# Patient Record
Sex: Male | Born: 1935 | Race: Black or African American | Hispanic: No | Marital: Married | Smoking: Former smoker
Health system: Southern US, Community
[De-identification: ages and names within clinical notes are randomized; demographics above are authoritative.]

## PROBLEM LIST (undated history)

## (undated) ENCOUNTER — Emergency Department (HOSPITAL_COMMUNITY): Payer: Medicare PPO

## (undated) DIAGNOSIS — I10 Essential (primary) hypertension: Secondary | ICD-10-CM

## (undated) DIAGNOSIS — I6789 Other cerebrovascular disease: Secondary | ICD-10-CM

## (undated) DIAGNOSIS — K219 Gastro-esophageal reflux disease without esophagitis: Secondary | ICD-10-CM

## (undated) DIAGNOSIS — Z8546 Personal history of malignant neoplasm of prostate: Secondary | ICD-10-CM

## (undated) DIAGNOSIS — N289 Disorder of kidney and ureter, unspecified: Secondary | ICD-10-CM

## (undated) DIAGNOSIS — I509 Heart failure, unspecified: Secondary | ICD-10-CM

## (undated) DIAGNOSIS — E785 Hyperlipidemia, unspecified: Secondary | ICD-10-CM

## (undated) DIAGNOSIS — I6529 Occlusion and stenosis of unspecified carotid artery: Secondary | ICD-10-CM

## (undated) DIAGNOSIS — N186 End stage renal disease: Secondary | ICD-10-CM

## (undated) DIAGNOSIS — E119 Type 2 diabetes mellitus without complications: Secondary | ICD-10-CM

## (undated) DIAGNOSIS — I2510 Atherosclerotic heart disease of native coronary artery without angina pectoris: Secondary | ICD-10-CM

## (undated) DIAGNOSIS — D500 Iron deficiency anemia secondary to blood loss (chronic): Secondary | ICD-10-CM

## (undated) DIAGNOSIS — M25569 Pain in unspecified knee: Secondary | ICD-10-CM

## (undated) DIAGNOSIS — E876 Hypokalemia: Secondary | ICD-10-CM

## (undated) HISTORY — DX: Occlusion and stenosis of unspecified carotid artery: I65.29

## (undated) HISTORY — DX: Essential (primary) hypertension: I10

## (undated) HISTORY — DX: Other cerebrovascular disease: I67.89

## (undated) HISTORY — PX: PROSTATE SURGERY: SHX751

## (undated) HISTORY — DX: Iron deficiency anemia secondary to blood loss (chronic): D50.0

## (undated) HISTORY — DX: Pain in unspecified knee: M25.569

## (undated) HISTORY — DX: Gastro-esophageal reflux disease without esophagitis: K21.9

## (undated) HISTORY — DX: Personal history of malignant neoplasm of prostate: Z85.46

## (undated) HISTORY — DX: Hypokalemia: E87.6

## (undated) HISTORY — DX: Disorder of kidney and ureter, unspecified: N28.9

## (undated) HISTORY — DX: Hyperlipidemia, unspecified: E78.5

## (undated) HISTORY — DX: Type 2 diabetes mellitus without complications: E11.9

---

## 1999-04-28 ENCOUNTER — Other Ambulatory Visit: Admission: RE | Admit: 1999-04-28 | Discharge: 1999-04-28 | Payer: Self-pay | Admitting: Oral Surgery

## 2000-04-21 ENCOUNTER — Ambulatory Visit (HOSPITAL_COMMUNITY): Admission: RE | Admit: 2000-04-21 | Discharge: 2000-04-21 | Payer: Self-pay | Admitting: Cardiology

## 2000-05-31 ENCOUNTER — Ambulatory Visit (HOSPITAL_COMMUNITY): Admission: RE | Admit: 2000-05-31 | Discharge: 2000-05-31 | Payer: Self-pay | Admitting: *Deleted

## 2000-05-31 ENCOUNTER — Encounter: Payer: Self-pay | Admitting: *Deleted

## 2000-05-31 HISTORY — PX: CAROTID ARTERY ANGIOPLASTY: SHX1300

## 2000-06-06 ENCOUNTER — Encounter: Payer: Self-pay | Admitting: *Deleted

## 2000-06-06 ENCOUNTER — Ambulatory Visit (HOSPITAL_COMMUNITY): Admission: RE | Admit: 2000-06-06 | Discharge: 2000-06-06 | Payer: Self-pay | Admitting: *Deleted

## 2000-06-23 ENCOUNTER — Observation Stay (HOSPITAL_COMMUNITY): Admission: RE | Admit: 2000-06-23 | Discharge: 2000-06-24 | Payer: Self-pay | Admitting: Interventional Radiology

## 2000-08-28 ENCOUNTER — Ambulatory Visit (HOSPITAL_COMMUNITY): Admission: RE | Admit: 2000-08-28 | Discharge: 2000-08-28 | Payer: Self-pay | Admitting: *Deleted

## 2000-08-28 ENCOUNTER — Encounter: Payer: Self-pay | Admitting: *Deleted

## 2001-01-10 ENCOUNTER — Encounter: Payer: Self-pay | Admitting: Cardiology

## 2001-01-10 ENCOUNTER — Encounter: Admission: RE | Admit: 2001-01-10 | Discharge: 2001-01-10 | Payer: Self-pay | Admitting: Cardiology

## 2001-07-23 ENCOUNTER — Ambulatory Visit (HOSPITAL_COMMUNITY): Admission: RE | Admit: 2001-07-23 | Discharge: 2001-07-23 | Payer: Self-pay | Admitting: Interventional Radiology

## 2001-08-08 ENCOUNTER — Ambulatory Visit (HOSPITAL_COMMUNITY): Admission: RE | Admit: 2001-08-08 | Discharge: 2001-08-08 | Payer: Self-pay | Admitting: Gastroenterology

## 2002-07-23 ENCOUNTER — Ambulatory Visit (HOSPITAL_COMMUNITY): Admission: RE | Admit: 2002-07-23 | Discharge: 2002-07-23 | Payer: Self-pay | Admitting: Interventional Radiology

## 2003-04-25 ENCOUNTER — Encounter: Admission: RE | Admit: 2003-04-25 | Discharge: 2003-04-25 | Payer: Self-pay | Admitting: Cardiology

## 2003-04-25 ENCOUNTER — Encounter: Payer: Self-pay | Admitting: Cardiology

## 2003-05-02 ENCOUNTER — Ambulatory Visit (HOSPITAL_COMMUNITY): Admission: RE | Admit: 2003-05-02 | Discharge: 2003-05-02 | Payer: Self-pay | Admitting: Cardiology

## 2003-05-02 ENCOUNTER — Encounter: Payer: Self-pay | Admitting: Cardiology

## 2005-05-24 ENCOUNTER — Ambulatory Visit: Payer: Self-pay | Admitting: Gastroenterology

## 2005-05-31 ENCOUNTER — Ambulatory Visit: Payer: Self-pay | Admitting: Gastroenterology

## 2005-08-16 ENCOUNTER — Encounter: Admission: RE | Admit: 2005-08-16 | Discharge: 2005-08-16 | Payer: Self-pay | Admitting: Cardiology

## 2005-11-25 ENCOUNTER — Ambulatory Visit (HOSPITAL_COMMUNITY): Admission: RE | Admit: 2005-11-25 | Discharge: 2005-11-25 | Payer: Self-pay | Admitting: Interventional Radiology

## 2005-11-30 ENCOUNTER — Encounter: Payer: Self-pay | Admitting: Interventional Radiology

## 2005-12-20 ENCOUNTER — Ambulatory Visit: Payer: Self-pay | Admitting: Internal Medicine

## 2005-12-22 ENCOUNTER — Ambulatory Visit (HOSPITAL_COMMUNITY): Admission: RE | Admit: 2005-12-22 | Discharge: 2005-12-22 | Payer: Self-pay | Admitting: Interventional Radiology

## 2006-01-04 ENCOUNTER — Ambulatory Visit (HOSPITAL_COMMUNITY): Admission: RE | Admit: 2006-01-04 | Discharge: 2006-01-04 | Payer: Self-pay | Admitting: Interventional Radiology

## 2006-03-22 ENCOUNTER — Ambulatory Visit: Payer: Self-pay | Admitting: Internal Medicine

## 2006-06-19 ENCOUNTER — Ambulatory Visit: Payer: Self-pay | Admitting: Internal Medicine

## 2006-06-19 LAB — CONVERTED CEMR LAB
Alkaline Phosphatase: 67 units/L (ref 39–117)
BUN: 17 mg/dL (ref 6–23)
Bilirubin, Direct: 0.2 mg/dL (ref 0.0–0.3)
CO2: 33 meq/L — ABNORMAL HIGH (ref 19–32)
Calcium: 9.8 mg/dL (ref 8.4–10.5)
Chloride: 101 meq/L (ref 96–112)
Chol/HDL Ratio, serum: 4.5
Creatinine, Ser: 1.7 mg/dL — ABNORMAL HIGH (ref 0.4–1.5)
Glucose, Bld: 112 mg/dL — ABNORMAL HIGH (ref 70–99)
HDL: 29 mg/dL — ABNORMAL LOW (ref >39.0)
LDL Cholesterol: 74 mg/dL (ref 0–99)
MCHC: 33.6 g/dL (ref 30.0–36.0)
MCV: 80.9 fL (ref 78.0–100.0)
Potassium: 2.8 meq/L — ABNORMAL LOW (ref 3.5–5.1)
RBC: 4.71 M/uL (ref 4.22–5.81)
Sodium: 141 meq/L (ref 135–145)
Triglyceride fasting, serum: 133 mg/dL (ref 0–149)
VLDL: 27 mg/dL (ref 0–40)

## 2006-11-06 ENCOUNTER — Ambulatory Visit: Payer: Self-pay | Admitting: Internal Medicine

## 2006-12-05 ENCOUNTER — Ambulatory Visit: Payer: Self-pay | Admitting: Gastroenterology

## 2006-12-12 ENCOUNTER — Ambulatory Visit (HOSPITAL_COMMUNITY): Admission: RE | Admit: 2006-12-12 | Discharge: 2006-12-12 | Payer: Self-pay | Admitting: Interventional Radiology

## 2006-12-19 ENCOUNTER — Ambulatory Visit: Payer: Self-pay | Admitting: Gastroenterology

## 2006-12-19 LAB — CONVERTED CEMR LAB
Fecal Occult Blood: POSITIVE — AB
OCCULT 4: NEGATIVE
OCCULT 5: POSITIVE — AB

## 2007-01-12 ENCOUNTER — Ambulatory Visit: Payer: Self-pay | Admitting: Gastroenterology

## 2007-01-26 ENCOUNTER — Ambulatory Visit: Payer: Self-pay | Admitting: Gastroenterology

## 2007-01-26 ENCOUNTER — Encounter: Payer: Self-pay | Admitting: Gastroenterology

## 2007-01-26 LAB — HM COLONOSCOPY

## 2007-03-05 ENCOUNTER — Encounter: Payer: Self-pay | Admitting: Internal Medicine

## 2007-03-05 DIAGNOSIS — E785 Hyperlipidemia, unspecified: Secondary | ICD-10-CM

## 2007-03-05 DIAGNOSIS — I6789 Other cerebrovascular disease: Secondary | ICD-10-CM | POA: Insufficient documentation

## 2007-03-05 DIAGNOSIS — Z8546 Personal history of malignant neoplasm of prostate: Secondary | ICD-10-CM

## 2007-03-05 DIAGNOSIS — I10 Essential (primary) hypertension: Secondary | ICD-10-CM

## 2007-03-05 HISTORY — DX: Hyperlipidemia, unspecified: E78.5

## 2007-03-05 HISTORY — DX: Essential (primary) hypertension: I10

## 2007-03-05 HISTORY — DX: Other cerebrovascular disease: I67.89

## 2007-03-05 HISTORY — DX: Personal history of malignant neoplasm of prostate: Z85.46

## 2007-03-07 ENCOUNTER — Ambulatory Visit: Payer: Self-pay | Admitting: Internal Medicine

## 2007-03-07 LAB — CONVERTED CEMR LAB
AST: 20 units/L (ref 0–37)
BUN: 16 mg/dL (ref 6–23)
CO2: 33 meq/L — ABNORMAL HIGH (ref 19–32)
Chloride: 104 meq/L (ref 96–112)
Eosinophils Absolute: 0.2 10*3/uL (ref 0.0–0.6)
GFR calc Af Amer: 45 mL/min
GFR calc non Af Amer: 37 mL/min
Glucose, Bld: 171 mg/dL — ABNORMAL HIGH (ref 70–99)
HCT: 28.3 % — ABNORMAL LOW (ref 39.0–52.0)
HDL: 31.4 mg/dL — ABNORMAL LOW (ref >39.0)
LDL Cholesterol: 56 mg/dL (ref 0–99)
Monocytes Absolute: 0.5 10*3/uL (ref 0.2–0.7)
Monocytes Relative: 7.5 % (ref 3.0–11.0)
Neutro Abs: 4.8 10*3/uL (ref 1.4–7.7)
Potassium: 3.3 meq/L — ABNORMAL LOW (ref 3.5–5.1)
RBC: 4.27 M/uL (ref 4.22–5.81)
Sodium: 144 meq/L (ref 135–145)
Total Bilirubin: 0.5 mg/dL (ref 0.3–1.2)
Total CHOL/HDL Ratio: 3.6
Total Protein: 7.9 g/dL (ref 6.0–8.3)
Triglycerides: 135 mg/dL (ref 0–149)
WBC: 7.2 10*3/uL (ref 4.5–10.5)

## 2007-03-08 ENCOUNTER — Telehealth (INDEPENDENT_AMBULATORY_CARE_PROVIDER_SITE_OTHER): Payer: Self-pay

## 2007-03-13 ENCOUNTER — Ambulatory Visit: Payer: Self-pay | Admitting: Internal Medicine

## 2007-03-13 DIAGNOSIS — K219 Gastro-esophageal reflux disease without esophagitis: Secondary | ICD-10-CM

## 2007-03-13 DIAGNOSIS — D500 Iron deficiency anemia secondary to blood loss (chronic): Secondary | ICD-10-CM

## 2007-03-13 HISTORY — DX: Iron deficiency anemia secondary to blood loss (chronic): D50.0

## 2007-03-13 HISTORY — DX: Gastro-esophageal reflux disease without esophagitis: K21.9

## 2007-03-13 LAB — CONVERTED CEMR LAB
Eosinophils Relative: 1.6 % (ref 0.0–5.0)
Ferritin: 7.6 ng/mL — ABNORMAL LOW (ref 22.0–322.0)
HCT: 27.9 % — ABNORMAL LOW (ref 39.0–52.0)
Hemoglobin: 9 g/dL — ABNORMAL LOW (ref 13.0–17.0)
Lymphocytes Relative: 25.7 % (ref 12.0–46.0)
MCV: 66.7 fL — ABNORMAL LOW (ref 78.0–100.0)
Monocytes Absolute: 0.5 10*3/uL (ref 0.2–0.7)
Platelets: 430 10*3/uL — ABNORMAL HIGH (ref 150–400)
RDW: 15.3 % — ABNORMAL HIGH (ref 11.5–14.6)

## 2007-04-13 ENCOUNTER — Ambulatory Visit: Payer: Self-pay | Admitting: Internal Medicine

## 2007-04-13 LAB — CONVERTED CEMR LAB: Hemoglobin: 12.1 g/dL

## 2007-05-18 ENCOUNTER — Encounter: Payer: Self-pay | Admitting: Internal Medicine

## 2007-06-07 ENCOUNTER — Encounter: Payer: Self-pay | Admitting: Internal Medicine

## 2007-06-14 ENCOUNTER — Encounter: Admission: RE | Admit: 2007-06-14 | Discharge: 2007-06-14 | Payer: Self-pay | Admitting: Cardiology

## 2007-08-30 ENCOUNTER — Ambulatory Visit: Payer: Self-pay | Admitting: Internal Medicine

## 2007-08-30 LAB — CONVERTED CEMR LAB
Eosinophils Absolute: 0.2 10*3/uL (ref 0.0–0.6)
MCHC: 34.3 g/dL (ref 30.0–36.0)
MCV: 81.2 fL (ref 78.0–100.0)
Monocytes Absolute: 0.5 10*3/uL (ref 0.2–0.7)
Monocytes Relative: 6.1 % (ref 3.0–11.0)
Neutro Abs: 6.1 10*3/uL (ref 1.4–7.7)
Platelets: 333 10*3/uL (ref 150–400)
RBC: 4.76 M/uL (ref 4.22–5.81)

## 2007-09-17 ENCOUNTER — Telehealth: Payer: Self-pay | Admitting: Internal Medicine

## 2007-09-19 ENCOUNTER — Telehealth: Payer: Self-pay | Admitting: Internal Medicine

## 2007-09-19 ENCOUNTER — Ambulatory Visit: Payer: Self-pay | Admitting: Internal Medicine

## 2007-09-19 DIAGNOSIS — E1129 Type 2 diabetes mellitus with other diabetic kidney complication: Secondary | ICD-10-CM | POA: Insufficient documentation

## 2007-09-19 DIAGNOSIS — E1169 Type 2 diabetes mellitus with other specified complication: Secondary | ICD-10-CM | POA: Insufficient documentation

## 2007-09-19 DIAGNOSIS — E119 Type 2 diabetes mellitus without complications: Secondary | ICD-10-CM

## 2007-09-19 HISTORY — DX: Type 2 diabetes mellitus without complications: E11.9

## 2007-09-24 ENCOUNTER — Encounter (INDEPENDENT_AMBULATORY_CARE_PROVIDER_SITE_OTHER): Payer: Self-pay

## 2007-09-26 ENCOUNTER — Ambulatory Visit: Payer: Self-pay | Admitting: Internal Medicine

## 2007-10-03 ENCOUNTER — Telehealth: Payer: Self-pay | Admitting: Family Medicine

## 2007-10-08 ENCOUNTER — Encounter: Admission: RE | Admit: 2007-10-08 | Discharge: 2008-01-06 | Payer: Self-pay | Admitting: Internal Medicine

## 2007-10-16 ENCOUNTER — Telehealth (INDEPENDENT_AMBULATORY_CARE_PROVIDER_SITE_OTHER): Payer: Self-pay | Admitting: *Deleted

## 2007-12-31 ENCOUNTER — Ambulatory Visit: Payer: Self-pay | Admitting: Internal Medicine

## 2007-12-31 LAB — CONVERTED CEMR LAB: Hgb A1c MFr Bld: 6.5 % — ABNORMAL HIGH (ref 4.6–6.0)

## 2008-01-28 ENCOUNTER — Telehealth: Payer: Self-pay | Admitting: Internal Medicine

## 2008-01-29 ENCOUNTER — Encounter: Payer: Self-pay | Admitting: Internal Medicine

## 2008-03-26 ENCOUNTER — Ambulatory Visit: Payer: Self-pay | Admitting: Internal Medicine

## 2008-05-15 ENCOUNTER — Encounter: Payer: Self-pay | Admitting: Internal Medicine

## 2008-07-01 ENCOUNTER — Ambulatory Visit: Payer: Self-pay | Admitting: Internal Medicine

## 2008-07-01 LAB — CONVERTED CEMR LAB
ALT: 12 units/L (ref 0–53)
AST: 19 units/L (ref 0–37)
Alkaline Phosphatase: 56 units/L (ref 39–117)
BUN: 26 mg/dL — ABNORMAL HIGH (ref 6–23)
Eosinophils Relative: 1.8 % (ref 0.0–5.0)
GFR calc non Af Amer: 46 mL/min
Hemoglobin: 13.6 g/dL (ref 13.0–17.0)
Hgb A1c MFr Bld: 6 % (ref 4.6–6.0)
Lymphocytes Relative: 23.3 % (ref 12.0–46.0)
Monocytes Absolute: 0.4 10*3/uL (ref 0.1–1.0)
Monocytes Relative: 6.3 % (ref 3.0–12.0)
Platelets: 257 10*3/uL (ref 150–400)
RBC: 4.88 M/uL (ref 4.22–5.81)
RDW: 12.7 % (ref 11.5–14.6)
TSH: 0.96 microintl units/mL (ref 0.35–5.50)
Total Bilirubin: 0.7 mg/dL (ref 0.3–1.2)
Total Protein: 7.8 g/dL (ref 6.0–8.3)
Triglycerides: 76 mg/dL (ref 0–149)
WBC: 6.5 10*3/uL (ref 4.5–10.5)

## 2008-08-01 ENCOUNTER — Encounter: Payer: Self-pay | Admitting: Internal Medicine

## 2008-08-25 ENCOUNTER — Telehealth: Payer: Self-pay | Admitting: Internal Medicine

## 2008-11-04 ENCOUNTER — Encounter: Payer: Self-pay | Admitting: Internal Medicine

## 2008-11-05 ENCOUNTER — Ambulatory Visit: Payer: Self-pay | Admitting: Internal Medicine

## 2009-02-03 ENCOUNTER — Ambulatory Visit: Payer: Self-pay | Admitting: Internal Medicine

## 2009-02-03 DIAGNOSIS — N289 Disorder of kidney and ureter, unspecified: Secondary | ICD-10-CM

## 2009-02-03 HISTORY — DX: Disorder of kidney and ureter, unspecified: N28.9

## 2009-02-04 ENCOUNTER — Telehealth: Payer: Self-pay | Admitting: Internal Medicine

## 2009-02-05 LAB — CONVERTED CEMR LAB
BUN: 32 mg/dL — ABNORMAL HIGH (ref 6–23)
Calcium: 9.8 mg/dL (ref 8.4–10.5)
Creatinine, Ser: 2 mg/dL — ABNORMAL HIGH (ref 0.4–1.5)
Hgb A1c MFr Bld: 6.1 % (ref 4.6–6.5)
Phosphorus: 3.2 mg/dL (ref 2.3–4.6)

## 2009-03-17 ENCOUNTER — Telehealth: Payer: Self-pay | Admitting: Internal Medicine

## 2009-03-17 ENCOUNTER — Ambulatory Visit: Payer: Self-pay | Admitting: Internal Medicine

## 2009-03-17 LAB — CONVERTED CEMR LAB
Chloride: 104 meq/L (ref 96–112)
Creatinine, Ser: 1.4 mg/dL (ref 0.4–1.5)
GFR calc non Af Amer: 63.98 mL/min (ref >60)
Potassium: 3.6 meq/L (ref 3.5–5.1)

## 2009-04-23 ENCOUNTER — Telehealth: Payer: Self-pay | Admitting: Internal Medicine

## 2009-05-11 ENCOUNTER — Ambulatory Visit: Payer: Self-pay | Admitting: Internal Medicine

## 2009-05-11 DIAGNOSIS — M542 Cervicalgia: Secondary | ICD-10-CM

## 2009-06-04 ENCOUNTER — Encounter: Payer: Self-pay | Admitting: Internal Medicine

## 2009-06-15 ENCOUNTER — Ambulatory Visit: Payer: Self-pay | Admitting: Internal Medicine

## 2009-06-16 LAB — CONVERTED CEMR LAB
Basophils Relative: 0.1 % (ref 0.0–3.0)
CO2: 31 meq/L (ref 19–32)
Calcium: 9.7 mg/dL (ref 8.4–10.5)
Creatinine,U: 198.6 mg/dL
Eosinophils Absolute: 0.1 10*3/uL (ref 0.0–0.7)
Hgb A1c MFr Bld: 6.4 % (ref 4.6–6.5)
Lymphocytes Relative: 15.8 % (ref 12.0–46.0)
MCHC: 32.9 g/dL (ref 30.0–36.0)
MCV: 86.5 fL (ref 78.0–100.0)
Microalb Creat Ratio: 77 mg/g — ABNORMAL HIGH (ref 0.0–30.0)
Microalb, Ur: 15.3 mg/dL — ABNORMAL HIGH (ref 0.0–1.9)
Monocytes Absolute: 0.6 10*3/uL (ref 0.1–1.0)
Monocytes Relative: 6.4 % (ref 3.0–12.0)
Neutrophils Relative %: 76.9 % (ref 43.0–77.0)
Platelets: 295 10*3/uL (ref 150.0–400.0)
RBC: 5.2 M/uL (ref 4.22–5.81)
RDW: 12.7 % (ref 11.5–14.6)
Sodium: 143 meq/L (ref 135–145)

## 2009-07-10 ENCOUNTER — Ambulatory Visit: Payer: Self-pay | Admitting: Internal Medicine

## 2009-09-09 ENCOUNTER — Ambulatory Visit: Payer: Self-pay | Admitting: Internal Medicine

## 2009-09-09 LAB — CONVERTED CEMR LAB
Creatinine, Ser: 1.5 mg/dL (ref 0.4–1.5)
GFR calc non Af Amer: 59 mL/min (ref >60)
Glucose, Bld: 96 mg/dL (ref 70–99)
Potassium: 3.3 meq/L — ABNORMAL LOW (ref 3.5–5.1)

## 2009-11-07 ENCOUNTER — Emergency Department (HOSPITAL_COMMUNITY): Admission: EM | Admit: 2009-11-07 | Discharge: 2009-11-07 | Payer: Self-pay | Admitting: Emergency Medicine

## 2009-11-07 ENCOUNTER — Ambulatory Visit: Payer: Self-pay | Admitting: Vascular Surgery

## 2009-11-09 ENCOUNTER — Ambulatory Visit: Payer: Self-pay | Admitting: Internal Medicine

## 2009-11-09 DIAGNOSIS — E876 Hypokalemia: Secondary | ICD-10-CM

## 2009-11-09 DIAGNOSIS — M25569 Pain in unspecified knee: Secondary | ICD-10-CM

## 2009-11-09 HISTORY — DX: Pain in unspecified knee: M25.569

## 2009-11-09 HISTORY — DX: Hypokalemia: E87.6

## 2009-11-30 ENCOUNTER — Ambulatory Visit: Payer: Self-pay | Admitting: Internal Medicine

## 2009-12-07 ENCOUNTER — Telehealth: Payer: Self-pay | Admitting: Internal Medicine

## 2009-12-30 ENCOUNTER — Telehealth: Payer: Self-pay | Admitting: Internal Medicine

## 2010-01-22 ENCOUNTER — Encounter: Payer: Self-pay | Admitting: Internal Medicine

## 2010-02-08 ENCOUNTER — Ambulatory Visit: Payer: Self-pay | Admitting: Internal Medicine

## 2010-05-10 ENCOUNTER — Ambulatory Visit: Payer: Self-pay | Admitting: Internal Medicine

## 2010-05-10 DIAGNOSIS — I6529 Occlusion and stenosis of unspecified carotid artery: Secondary | ICD-10-CM

## 2010-05-10 HISTORY — DX: Occlusion and stenosis of unspecified carotid artery: I65.29

## 2010-05-10 LAB — CONVERTED CEMR LAB
Chloride: 103 meq/L (ref 96–112)
GFR calc non Af Amer: 61.24 mL/min (ref >60)
Glucose, Bld: 119 mg/dL — ABNORMAL HIGH (ref 70–99)
Hgb A1c MFr Bld: 7.3 % — ABNORMAL HIGH (ref 4.6–6.5)

## 2010-05-11 ENCOUNTER — Telehealth: Payer: Self-pay

## 2010-05-20 ENCOUNTER — Encounter: Payer: Self-pay | Admitting: Internal Medicine

## 2010-05-27 ENCOUNTER — Encounter: Payer: Self-pay | Admitting: Internal Medicine

## 2010-05-27 ENCOUNTER — Ambulatory Visit: Payer: Self-pay | Admitting: Vascular Surgery

## 2010-06-22 ENCOUNTER — Encounter: Payer: Self-pay | Admitting: Internal Medicine

## 2010-08-26 ENCOUNTER — Other Ambulatory Visit: Payer: Self-pay | Admitting: Internal Medicine

## 2010-08-26 ENCOUNTER — Ambulatory Visit
Admission: RE | Admit: 2010-08-26 | Discharge: 2010-08-26 | Payer: Self-pay | Source: Home / Self Care | Attending: Internal Medicine | Admitting: Internal Medicine

## 2010-08-26 LAB — CBC WITH DIFFERENTIAL/PLATELET
Basophils Absolute: 0 10*3/uL (ref 0.0–0.1)
Basophils Relative: 0.6 % (ref 0.0–3.0)
Eosinophils Absolute: 0.1 10*3/uL (ref 0.0–0.7)
Eosinophils Relative: 1.6 % (ref 0.0–5.0)
HCT: 43.9 % (ref 39.0–52.0)
Hemoglobin: 14.9 g/dL (ref 13.0–17.0)
Lymphocytes Relative: 20.3 % (ref 12.0–46.0)
Lymphs Abs: 1.5 10*3/uL (ref 0.7–4.0)
MCHC: 34 g/dL (ref 30.0–36.0)
MCV: 83.7 fl (ref 78.0–100.0)
Monocytes Absolute: 0.5 10*3/uL (ref 0.1–1.0)
Monocytes Relative: 6.6 % (ref 3.0–12.0)
Neutro Abs: 5.3 10*3/uL (ref 1.4–7.7)
Neutrophils Relative %: 70.9 % (ref 43.0–77.0)
Platelets: 297 10*3/uL (ref 150.0–400.0)
RBC: 5.24 Mil/uL (ref 4.22–5.81)
RDW: 13.5 % (ref 11.5–14.6)
WBC: 7.5 10*3/uL (ref 4.5–10.5)

## 2010-08-26 LAB — HEMOGLOBIN A1C: Hgb A1c MFr Bld: 7.2 % — ABNORMAL HIGH (ref 4.6–6.5)

## 2010-08-26 LAB — HEPATIC FUNCTION PANEL
ALT: 12 U/L (ref 0–53)
AST: 17 U/L (ref 0–37)
Albumin: 4.1 g/dL (ref 3.5–5.2)
Alkaline Phosphatase: 100 U/L (ref 39–117)
Bilirubin, Direct: 0.1 mg/dL (ref 0.0–0.3)
Total Bilirubin: 0.8 mg/dL (ref 0.3–1.2)
Total Protein: 7.4 g/dL (ref 6.0–8.3)

## 2010-08-26 LAB — LIPID PANEL
Cholesterol: 162 mg/dL (ref 0–200)
HDL: 34.2 mg/dL — ABNORMAL LOW (ref >39.00)
LDL Cholesterol: 107 mg/dL — ABNORMAL HIGH (ref 0–99)
Total CHOL/HDL Ratio: 5
Triglycerides: 104 mg/dL (ref 0.0–149.0)
VLDL: 20.8 mg/dL (ref 0.0–40.0)

## 2010-08-26 LAB — MICROALBUMIN / CREATININE URINE RATIO
Creatinine,U: 180.5 mg/dL
Microalb Creat Ratio: 38.1 mg/g — ABNORMAL HIGH (ref 0.0–30.0)
Microalb, Ur: 68.7 mg/dL — ABNORMAL HIGH (ref 0.0–1.9)

## 2010-08-26 LAB — TSH: TSH: 0.96 u[IU]/mL (ref 0.35–5.50)

## 2010-08-26 LAB — BASIC METABOLIC PANEL
BUN: 18 mg/dL (ref 6–23)
CO2: 30 mEq/L (ref 19–32)
Calcium: 9.6 mg/dL (ref 8.4–10.5)
Chloride: 100 mEq/L (ref 96–112)
Creatinine, Ser: 1.5 mg/dL (ref 0.4–1.5)
GFR: 60.23 mL/min (ref >60.00)
Glucose, Bld: 116 mg/dL — ABNORMAL HIGH (ref 70–99)
Potassium: 3.5 mEq/L (ref 3.5–5.1)
Sodium: 138 mEq/L (ref 135–145)

## 2010-09-12 ENCOUNTER — Encounter: Payer: Self-pay | Admitting: Interventional Radiology

## 2010-09-19 LAB — CONVERTED CEMR LAB
Blood Glucose, Fingerstick: 417
Blood in Urine, dipstick: NEGATIVE
Hgb A1c MFr Bld: 12 % — ABNORMAL HIGH (ref 4.6–6.0)
Nitrite: NEGATIVE
Protein, U semiquant: NEGATIVE
pH: 5

## 2010-09-23 NOTE — Medication Information (Signed)
Summary: PA and Approval for Nexium  PA and Approval for Nexium   Imported By: Laural Benes 06/25/2010 12:35:18  _____________________________________________________________________  External Attachment:    Type:   Image     Comment:   External Document

## 2010-09-23 NOTE — Letter (Signed)
Summary: Eye Exam/[facility]  Eye Exam/[facility]   Imported By: Laural Benes 01/27/2010 15:19:33  _____________________________________________________________________  External Attachment:    Type:   Image     Comment:   External Document

## 2010-09-23 NOTE — Assessment & Plan Note (Signed)
Summary: 3 month rov/njr/pt rsc/cjr   Vital Signs:  Patient profile:   76 year old male Weight:      190 pounds BP sitting:   130 / 66  (left arm) Cuff size:   regular  Vitals Entered By: Chipper Oman, RN (September 09, 2009 11:07 AM) CC: 3 mo ROV, FBS 120.   CC:  3 mo ROV and FBS 120.Marland Kitchen  History of Present Illness: 76 year old patient who is in today for follow-up.  He has hypertension, type 2 diabetes, and chronic kidney disease.  His creatinine was stable at 1.5.  Last visit and after peaking at 2.0.  He feels well today.  He has not been using hydralazine or potassium supplementation.  His potassium was 3.0.  He feels well today except for some minor back discomfort.  He denies any cardiopulmonary complaints.  Allergies: No Known Drug Allergies  Review of Systems  The patient denies anorexia, fever, weight loss, weight gain, vision loss, decreased hearing, hoarseness, chest pain, syncope, dyspnea on exertion, peripheral edema, prolonged cough, headaches, hemoptysis, abdominal pain, melena, hematochezia, severe indigestion/heartburn, hematuria, incontinence, genital sores, muscle weakness, suspicious skin lesions, transient blindness, difficulty walking, depression, unusual weight change, abnormal bleeding, enlarged lymph nodes, angioedema, breast masses, and testicular masses.    Physical Exam  General:  Well-developed,well-nourished,in no acute distress; alert,appropriate and cooperative throughout examination Head:  Normocephalic and atraumatic without obvious abnormalities. No apparent alopecia or balding. Eyes:  No corneal or conjunctival inflammation noted. EOMI. Perrla. Funduscopic exam benign, without hemorrhages, exudates or papilledema. Vision grossly normal. Mouth:  Oral mucosa and oropharynx without lesions or exudates.  Teeth in good repair. Neck:  No deformities, masses, or tenderness noted. Lungs:  Normal respiratory effort, chest expands symmetrically. Lungs are  clear to auscultation, no crackles or wheezes. Heart:  Normal rate and regular rhythm. S1 and S2 normal without gallop, murmur, click, rub or other extra sounds. Abdomen:  Bowel sounds positive,abdomen soft and non-tender without masses, organomegaly or hernias noted. Extremities:  no edema   Impression & Recommendations:  Problem # 1:  RENAL DISEASE, CHRONIC (ICD-593.9)  will check renal indices.  This appears to be stable  Orders: Venipuncture [id number]) TLB-BMP (Basic Metabolic Panel-BMET) (99991111)  Problem # 2:  DIABETES MELLITUS, TYPE II (ICD-250.00)  His updated medication list for this problem includes:    Adult Aspirin Ec Low Strength 81 Mg Tbec (Aspirin) .Marland Kitchen... 1 once daily    Cozaar 100 Mg Tabs (Losartan potassium) .Marland Kitchen... 1 once daily    Glipizide 5 Mg Xr24h-tab (Glipizide) ..... One daily    His updated medication list for this problem includes:    Adult Aspirin Ec Low Strength 81 Mg Tbec (Aspirin) .Marland Kitchen... 1 once daily    Cozaar 100 Mg Tabs (Losartan potassium) .Marland Kitchen... 1 once daily    Glipizide 5 Mg Xr24h-tab (Glipizide) ..... One daily  Orders: TLB-A1C / Hgb A1C (Glycohemoglobin) (83036-A1C)  Problem # 3:  HYPERTENSION (ICD-401.9)  His updated medication list for this problem includes:    Caduet 10-20 Mg Tabs (Amlodipine-atorvastatin) .Marland Kitchen... 1 once daily    Cozaar 100 Mg Tabs (Losartan potassium) .Marland Kitchen... 1 once daily blood pressure is well controlled today without hydralazine-will discontinue  His updated medication list for this problem includes:    Caduet 10-20 Mg Tabs (Amlodipine-atorvastatin) .Marland Kitchen... 1 once daily    Cozaar 100 Mg Tabs (Losartan potassium) .Marland Kitchen... 1 once daily  Complete Medication List: 1)  Analpram-hc 1-2.5 % Crea (Hydrocortisone ace-pramoxine) .... Use as dir 2)  Caduet 10-20 Mg Tabs (Amlodipine-atorvastatin) .Marland Kitchen.. 1 once daily 3)  Nexium 40 Mg Cpdr (Esomeprazole magnesium) .Marland Kitchen.. 1 once daily 4)  Plavix 75 Mg Tabs (Clopidogrel bisulfate) .Marland Kitchen.. 1  once daily 5)  Adult Aspirin Ec Low Strength 81 Mg Tbec (Aspirin) .Marland Kitchen.. 1 once daily 6)  Feratab 300 (60 Fe) Mg Tabs (Ferrous sulfate) .Marland Kitchen.. 1 once daily 7)  Freestyle Lite Strp (Glucose blood) .... Use as dir 8)  Cozaar 100 Mg Tabs (Losartan potassium) .Marland Kitchen.. 1 once daily 9)  Glipizide 5 Mg Xr24h-tab (Glipizide) .... One daily 10)  Potassium Chloride 20 Meq Pack (Potassium chloride) .Marland Kitchen.. 1 once daily  Patient Instructions: 1)  Please schedule a follow-up appointment in 3 months. 2)  It is important that you exercise regularly at least 20 minutes 5 times a week. If you develop chest pain, have severe difficulty breathing, or feel very tired , stop exercising immediately and seek medical attention. 3)  Check your blood sugars regularly. If your readings are usually above : or below 70 you should contact our office. 4)  It is important that your Diabetic A1c level is checked every 3 months. 5)  See your eye doctor yearly to check for diabetic eye damage. Prescriptions: POTASSIUM CHLORIDE 20 MEQ PACK (POTASSIUM CHLORIDE) 1 once daily  #100 x 2   Entered and Authorized by:   Marletta Lor  MD   Signed by:   Marletta Lor  MD on 09/09/2009   Method used:   Print then Give to Patient   RxID:   [id number] GLIPIZIDE 5 MG XR24H-TAB (GLIPIZIDE) one daily  #90 x 6   Entered and Authorized by:   Marletta Lor  MD   Signed by:   Marletta Lor  MD on 09/09/2009   Method used:   Print then Give to Patient   RxID:   [id number] COZAAR 100 MG TABS (LOSARTAN POTASSIUM) 1 once daily  #90 x 2   Entered and Authorized by:   Marletta Lor  MD   Signed by:   Marletta Lor  MD on 09/09/2009   Method used:   Print then Give to Patient   RxID:   [id number] FREESTYLE LITE   STRP (GLUCOSE BLOOD) use as dir  #53 x 12   Entered and Authorized by:   Marletta Lor  MD   Signed by:   Marletta Lor  MD on 09/09/2009   Method used:   Print then Give to  Patient   RxID:   [id number] FERATAB 300 (60 FE) MG  TABS (FERROUS SULFATE) 1 once daily  #0 x 0   Entered and Authorized by:   Marletta Lor  MD   Signed by:   Marletta Lor  MD on 09/09/2009   Method used:   Print then Give to Patient   RxID:   [id number] PLAVIX 75 MG TABS (CLOPIDOGREL BISULFATE) 1 once daily  #90 Tablet x 3   Entered and Authorized by:   Marletta Lor  MD   Signed by:   Marletta Lor  MD on 09/09/2009   Method used:   Print then Give to Patient   RxID:   [id number] [place] 40 MG CPDR (ESOMEPRAZOLE MAGNESIUM) 1 once daily  #50 Capsule x 5   Entered and Authorized by:   Marletta Lor  MD   Signed by:   Marletta Lor  MD on 09/09/2009   Method used:   Print then  Give to Patient   RxID:   [id number] CADUET 10-20 MG TABS (AMLODIPINE-ATORVASTATIN) 1 once daily  #90 Tablet x 5   Entered and Authorized by:   Marletta Lor  MD   Signed by:   Marletta Lor  MD on 09/09/2009   Method used:   Print then Give to Patient   RxID:   [id number]

## 2010-09-23 NOTE — Assessment & Plan Note (Signed)
Summary: 3 month fup//ccm   Vital Signs:  Patient profile:   76 year old male Weight:      196 pounds Temp:     98.8 degrees F oral BP sitting:   160 / 90  (left arm) Cuff size:   regular  Vitals Entered By: Cay Schillings LPN (September 19, 624THL 10:04 AM) CC: 3 mos rov - fair     c/o headaches - family stress    fbs 121 Is Patient Diabetic? Yes Did you bring your meter with you today? No Flu Vaccine Consent Questions     Do you have a history of severe allergic reactions to this vaccine? no    Any prior history of allergic reactions to egg and/or gelatin? no    Do you have a sensitivity to the preservative Thimersol? no    Do you have a past history of Guillan-Barre Syndrome? no    Do you currently have an acute febrile illness? no    Have you ever had a severe reaction to latex? no    Vaccine information given and explained to patient? yes    Are you currently pregnant? no    Lot Number:[id number]   Exp Date:02/19/2011   Site Given  Left Deltoid IM   CC:  3 mos rov - fair     c/o headaches - family stress    fbs 121.  History of Present Illness: 76 year old patient who is seen today for follow-up.  He has a history of hypertension, dyslipidemia, and type 2 diabetes.  He has chronic kidney disease.  He is had angioplasty involved in a carotid artery in the past.  Denies any focal neurological symptoms.  Has had no recent carotid artery.  Doppler evaluation.  Blood sugars have done well.  Allergies (verified): No Known Drug Allergies  Past History:  Past Medical History: Reviewed history from 11/30/2009 and no changes required. Hyperlipidemia Hypertension Prostate cancer, hx of; status post radical prostatectomy at [facility], 1993 Cerebrovascular disease status post right carotid angioplasty in 2001 iron deficiency anemia Diabetes mellitus, type II CKD (creatinine 1.6) right knee pain  Past Surgical History: Reviewed history from 02/03/2009 and no changes  required. Carotid angioplasty 01 Prostatectomy 93 colonoscopy 2008  Social History: Reviewed history from 07/01/2008 and no changes required. Married retired from [employer] since [place]  The patient denies anorexia, fever, weight loss, weight gain, vision loss, decreased hearing, hoarseness, chest pain, syncope, dyspnea on exertion, peripheral edema, prolonged cough, headaches, hemoptysis, abdominal pain, melena, hematochezia, severe indigestion/heartburn, hematuria, incontinence, genital sores, muscle weakness, suspicious skin lesions, transient blindness, difficulty walking, depression, unusual weight change, abnormal bleeding, enlarged lymph nodes, angioedema, breast masses, and testicular masses.    Physical Exam  General:  overweight-appearing.  160/80overweight-appearing.   Head:  Normocephalic and atraumatic without obvious abnormalities. No apparent alopecia or balding. Eyes:  No corneal or conjunctival inflammation noted. EOMI. Perrla. Funduscopic exam benign, without hemorrhages, exudates or papilledema. Vision grossly normal. Mouth:  Oral mucosa and oropharynx without lesions or exudates.  Teeth in good repair. Neck:  No deformities, masses, or tenderness noted. Lungs:  Normal respiratory effort, chest expands symmetrically. Lungs are clear to auscultation, no crackles or wheezes. Heart:  Normal rate and regular rhythm. S1 and S2 normal without gallop, murmur, click, rub or other extra sounds. Abdomen:  Bowel sounds positive,abdomen soft and non-tender without masses, organomegaly or hernias noted. Msk:  No deformity or scoliosis noted of thoracic or lumbar spine.  Pulses:  R and L carotid,radial,femoral,dorsalis pedis and posterior tibial pulses are full and equal bilaterally Extremities:  No clubbing, cyanosis, edema, or deformity noted with normal full range of motion of all joints.    Diabetes Management Exam:    Foot Exam (with socks and/or shoes  not present):       Sensory-Pinprick/Light touch:          Left medial foot (L-4): normal          Left dorsal foot (L-5): normal          Left lateral foot (S-1): normal          Right medial foot (L-4): normal          Right dorsal foot (L-5): normal          Right lateral foot (S-1): normal       Sensory-Monofilament:          Left foot: normal          Right foot: normal       Inspection:          Left foot: normal          Right foot: normal       Nails:          Left foot: normal          Right foot: normal    Foot Exam by Podiatrist:       Date: 05/10/2010       Results: no diabetic findings       Done by: PCP   Impression & Recommendations:  Problem # 1:  RENAL DISEASE, CHRONIC (ICD-593.9)  Orders: Venipuncture [id number]) TLB-BMP (Basic Metabolic Panel-BMET) (99991111) TLB-A1C / Hgb A1C (Glycohemoglobin) (83036-A1C) Specimen Handling (99000)  Problem # 2:  DIABETES MELLITUS, TYPE II (ICD-250.00)  His updated medication list for this problem includes:    Adult Aspirin Ec Low Strength 81 Mg Tbec (Aspirin) .Marland Kitchen... 1 once daily    Cozaar 100 Mg Tabs (Losartan potassium) .Marland Kitchen... 1 once daily    Glipizide 5 Mg Xr24h-tab (Glipizide) ..... One daily  His updated medication list for this problem includes:    Adult Aspirin Ec Low Strength 81 Mg Tbec (Aspirin) .Marland Kitchen... 1 once daily    Cozaar 100 Mg Tabs (Losartan potassium) .Marland Kitchen... 1 once daily    Glipizide 5 Mg Xr24h-tab (Glipizide) ..... One daily  Orders: TLB-A1C / Hgb A1C (Glycohemoglobin) (83036-A1C)  Problem # 3:  HYPERTENSION (ICD-401.9)  His updated medication list for this problem includes:    Caduet 10-20 Mg Tabs (Amlodipine-atorvastatin) .Marland Kitchen... 1 once daily    Cozaar 100 Mg Tabs (Losartan potassium) .Marland Kitchen... 1 once daily  His updated medication list for this problem includes:    Caduet 10-20 Mg Tabs (Amlodipine-atorvastatin) .Marland Kitchen... 1 once daily    Cozaar 100 Mg Tabs (Losartan potassium) .Marland Kitchen... 1 once  daily  Problem # 4:  HYPERLIPIDEMIA (ICD-272.4)  His updated medication list for this problem includes:    Caduet 10-20 Mg Tabs (Amlodipine-atorvastatin) .Marland Kitchen... 1 once daily  His updated medication list for this problem includes:    Caduet 10-20 Mg Tabs (Amlodipine-atorvastatin) .Marland Kitchen... 1 once daily  Complete Medication List: 1)  Analpram-hc 1-2.5 % Crea (Hydrocortisone ace-pramoxine) .... Use as dir 2)  Caduet 10-20 Mg Tabs (Amlodipine-atorvastatin) .Marland Kitchen.. 1 once daily 3)  Nexium 40 Mg Cpdr (Esomeprazole magnesium) .Marland Kitchen.. 1 once daily 4)  Plavix 75 Mg Tabs (Clopidogrel bisulfate) .Marland Kitchen.. 1 once daily 5)  Adult Aspirin Ec  Low Strength 81 Mg Tbec (Aspirin) .Marland Kitchen.. 1 once daily 6)  Feratab 300 (60 Fe) Mg Tabs (Ferrous sulfate) .Marland Kitchen.. 1 once daily 7)  Freestyle Lite Strp (Glucose blood) .... Use as dir 8)  Cozaar 100 Mg Tabs (Losartan potassium) .Marland Kitchen.. 1 once daily 9)  Glipizide 5 Mg Xr24h-tab (Glipizide) .... One daily 10)  Potassium Chloride 20 Meq Pack (Potassium chloride) .Marland Kitchen.. 1 once daily  Other Orders: Flu Vaccine 3yrs + MEDICARE PATIENTS [id number]) Administration Flu vaccine - MCR [id number]) [facility] Clinic (Vascular)  Patient Instructions: 1)  Please schedule a follow-up appointment in 3 months for CPX 2)  Advised not to eat any food or drink any liquids after 10 PM the night before your procedure. 3)  It is important that you exercise regularly at least 20 minutes 5 times a week. If you develop chest pain, have severe difficulty breathing, or feel very tired , stop exercising immediately and seek medical attention. 4)  You need to lose weight. Consider a lower calorie diet and regular exercise.  5)  Check your blood sugars regularly. If your readings are usually above : or below 70 you should contact our office. 6)  It is important that your Diabetic A1c level is checked every 3 months. Prescriptions: POTASSIUM CHLORIDE 20 MEQ PACK (POTASSIUM CHLORIDE) 1 once daily  #100 x 2   Entered and  Authorized by:   Marletta Lor  MD   Signed by:   Marletta Lor  MD on 05/10/2010   Method used:   Electronically to        [facility]  [phone]* (retail)       [place], [place]  [address]       Ph: [phone] or [phone]       Fax: [phone]   [id number] GLIPIZIDE 5 MG XR24H-TAB (GLIPIZIDE) one daily  #90 Tablet x 2   Entered and Authorized by:   Marletta Lor  MD   Signed by:   Marletta Lor  MD on 05/10/2010   Method used:   Electronically to        [facility]  [phone]* (retail)       [place], [place]  [address]       Ph: [phone] or [phone]       Fax: [phone]   [id number] COZAAR 100 MG TABS (LOSARTAN POTASSIUM) 1 once daily  #90 x 2   Entered and Authorized by:   Marletta Lor  MD   Signed by:   Marletta Lor  MD on 05/10/2010   Method used:   Electronically to        [facility]  [phone]* (retail)       [place], [place]  [address]       Ph: [phone] or [phone]       Fax: [phone]   [id number] FREESTYLE LITE   STRP (GLUCOSE BLOOD) use as dir  #100 Each x 2   Entered and Authorized by:   Marletta Lor  MD   Signed by:   Marletta Lor  MD on 05/10/2010   Method used:   Electronically to        [facility]  [phone]* (retail)       [address]  [place], [place]  [address]       Ph: [phone] or [phone]       Fax: [phone]   RxID:   [id number] PLAVIX 75 MG TABS (CLOPIDOGREL BISULFATE) 1 once daily  #100 Tablet x 2   Entered and Authorized by:   Marletta Lor  MD   Signed by:   Marletta Lor  MD on 05/10/2010   Method used:   Electronically to        [facility]  [phone]* (retail)       [place], [place]  [address]       Ph: [phone] or [phone]       Fax: [phone]   RxID:   [id number] [place]  40 MG CPDR (ESOMEPRAZOLE MAGNESIUM) 1 once daily  #50 Capsule x 5   Entered and Authorized by:   Marletta Lor  MD   Signed by:   Marletta Lor  MD on 05/10/2010   Method used:   Electronically to        [facility]  [phone]* (retail)       [place], [place]  [address]       Ph: [phone] or [phone]       Fax: [phone]   [id number] CADUET 10-20 MG TABS (AMLODIPINE-ATORVASTATIN) 1 once daily  #90 Tablet x 3   Entered and Authorized by:   Marletta Lor  MD   Signed by:   Marletta Lor  MD on 05/10/2010   Method used:   Electronically to        [facility]  [phone]* (retail)       [place], [place]  [address]       Ph: [phone] or [phone]       Fax: [phone]   [id number]

## 2010-09-23 NOTE — Progress Notes (Signed)
Summary: REQ FOR REFILL (Hydrocort-Pramoxine)  Phone Note Refill Request Message from:  Fax from Pharmacy on Dec 30, 2009 2:09 PM  Refills Requested: Medication #1:  HYDROCORT-PRAMOXINE 2.5-1% CRM   Notes: [facility] -  # [phone]    Initial call taken by: Duanne Moron,  Dec 30, 2009 2:08 PM    Prescriptions: ANALPRAM-HC 1-2.5 % CREA (HYDROCORTISONE ACE-PRAMOXINE) use as dir  #28.35 Gram x 1   Entered by:   Cay Schillings LPN   Authorized by:   Marletta Lor  MD   Signed by:   Cay Schillings LPN on [id number]   Method used:   [facility] ...       [facility]  [phone]* (retail)       [address] [place], [place]  [address]       Ph: [phone] or [phone]       Fax: [phone]   RxID:   [id number]

## 2010-09-23 NOTE — Progress Notes (Signed)
  Phone Note Call from Patient   Caller: Patient Call For: Marletta Lor  MD Reason for Call: Acute Illness Summary of Call: Pt had a nose bleed x 2 hours that he cannnot stop.  Presently taking Plavix....Marland Kitchensent to ER for possible packing or ENT Evaluation. Initial call taken by: Deanna Artis CMA,  December 07, 2009 4:41 PM  Follow-up for Phone Call        noted Follow-up by: Marletta Lor  MD,  December 07, 2009 5:13 PM

## 2010-09-23 NOTE — Assessment & Plan Note (Signed)
Summary: er fup//ccm   Vital Signs:  Patient profile:   76 year old male Weight:      193 pounds Temp:     97.2 degrees F oral BP sitting:   138 / 70  (right arm) Cuff size:   regular  Vitals Entered By: Cay Schillings LPN (March 21, 624THL QA348G AM) CC: f/u on ER - [place] saturaday - c/o (R) knee pain and swelling , no improving    fbs 104 Is Patient Diabetic? Yes   CC:  f/u on ER -  saturaday - c/o (R) knee pain and swelling  and no improving    fbs 104.  History of Present Illness: 76 year old patient who presented to [facility] ER two days ago complaining of right knee pain and right lower leg edema.  A vascular study was negative for DVT.  X-ray of the right knee revealed minimal DJD only laboratory studies revealed hypokalemia and stable mild renal insufficiency.  He has a history of hypertension and type 2 diabetes.  It is unclear whether he remains on diuretic therapy.  Diuretic therapy is no longer on his medication list.  Hospital records reviewed.  A hemoglobin A1c was also obtained.  That was 6.5.  Allergies (verified): No Known Drug Allergies  Past History:  Past Medical History: Reviewed history from 02/03/2009 and no changes required. Hyperlipidemia Hypertension Prostate cancer, hx of; status post radical prostatectomy at [facility], 1993 Cerebrovascular disease status post right carotid angioplasty in 2001 iron deficiency anemia Diabetes mellitus, type II CKD (creatinine 1.6)  Past Surgical History: Reviewed history from 02/03/2009 and no changes required. Carotid angioplasty 01 Prostatectomy 93 colonoscopy 2008  Review of Systems       The patient complains of difficulty walking.  The patient denies anorexia, fever, weight loss, weight gain, vision loss, decreased hearing, hoarseness, chest pain, syncope, dyspnea on exertion, peripheral edema, prolonged cough, headaches, hemoptysis, abdominal pain, melena, hematochezia, severe  indigestion/heartburn, hematuria, incontinence, genital sores, muscle weakness, suspicious skin lesions, transient blindness, depression, unusual weight change, abnormal bleeding, enlarged lymph nodes, angioedema, breast masses, and testicular masses.    Physical Exam  General:  overweight-appearing.  130/82 Head:  Normocephalic and atraumatic without obvious abnormalities. No apparent alopecia or balding. Eyes:  No corneal or conjunctival inflammation noted. EOMI. Perrla. Funduscopic exam benign, without hemorrhages, exudates or papilledema. Vision grossly normal. Mouth:  Oral mucosa and oropharynx without lesions or exudates.  Teeth in good repair. Neck:  No deformities, masses, or tenderness noted. Lungs:  Normal respiratory effort, chest expands symmetrically. Lungs are clear to auscultation, no crackles or wheezes. Heart:  Normal rate and regular rhythm. S1 and S2 normal without gallop, murmur, click, rub or other extra sounds. Abdomen:  Bowel sounds positive,abdomen soft and non-tender without masses, organomegaly or hernias noted. Msk:  the right knee was slightly warm to touch; right calf.  swelling noted without significant tenderness Pulses:  R and L carotid,radial,femoral,dorsalis pedis and posterior tibial pulses are full and equal bilaterally Extremities:  No clubbing, cyanosis, edema, or deformity noted with normal full range of motion of all joints.     Impression & Recommendations:  Problem # 1:  KNEE PAIN, RIGHT (ICD-719.46)  His updated medication list for this problem includes:    Adult Aspirin Ec Low Strength 81 Mg Tbec (Aspirin) .Marland Kitchen... 1 once daily  Problem # 2:  RENAL DISEASE, CHRONIC (ICD-593.9)  Problem # 3:  DIABETES MELLITUS, TYPE II (ICD-250.00)  His updated medication list for this  problem includes:    Adult Aspirin Ec Low Strength 81 Mg Tbec (Aspirin) .Marland Kitchen... 1 once daily    Cozaar 100 Mg Tabs (Losartan potassium) .Marland Kitchen... 1 once daily    Glipizide 5 Mg Xr24h-tab  (Glipizide) ..... One daily  Problem # 4:  HYPERTENSION (ICD-401.9)  His updated medication list for this problem includes:    Caduet 10-20 Mg Tabs (Amlodipine-atorvastatin) .Marland Kitchen... 1 once daily    Cozaar 100 Mg Tabs (Losartan potassium) .Marland Kitchen... 1 once daily  Problem # 5:  HYPOKALEMIA (ICD-276.8)  Complete Medication List: 1)  Analpram-hc 1-2.5 % Crea (Hydrocortisone ace-pramoxine) .... Use as dir 2)  Caduet 10-20 Mg Tabs (Amlodipine-atorvastatin) .Marland Kitchen.. 1 once daily 3)  Nexium 40 Mg Cpdr (Esomeprazole magnesium) .Marland Kitchen.. 1 once daily 4)  Plavix 75 Mg Tabs (Clopidogrel bisulfate) .Marland Kitchen.. 1 once daily 5)  Adult Aspirin Ec Low Strength 81 Mg Tbec (Aspirin) .Marland Kitchen.. 1 once daily 6)  Feratab 300 (60 Fe) Mg Tabs (Ferrous sulfate) .Marland Kitchen.. 1 once daily 7)  Freestyle Lite Strp (Glucose blood) .... Use as dir 8)  Cozaar 100 Mg Tabs (Losartan potassium) .Marland Kitchen.. 1 once daily 9)  Glipizide 5 Mg Xr24h-tab (Glipizide) .... One daily 10)  Potassium Chloride 20 Meq Pack (Potassium chloride) .Marland Kitchen.. 1 once daily 11)  Prednisone 5 Mg Tabs (Prednisone) .... One twice daily for 7 days  Patient Instructions: 1)  Please schedule a follow-up appointment in 3 months. 2)  Limit your Sodium (Salt). 3)  It is important that you exercise regularly at least 20 minutes 5 times a week. If you develop chest pain, have severe difficulty breathing, or feel very tired , stop exercising immediately and seek medical attention. 4)  You need to lose weight. Consider a lower calorie diet and regular exercise.  5)  Check your blood sugars regularly. If your readings are usually above : or below 70 you should contact our office. 6)  It is important that your Diabetic A1c level is checked every 3 months. 7)  See your eye doctor yearly to check for diabetic eye damage. Prescriptions: PREDNISONE 5 MG TABS (PREDNISONE) one twice daily for 7 days  #15 x 0   Entered and Authorized by:   Marletta Lor  MD   Signed by:   Marletta Lor  MD on  11/09/2009   Method used:   Print then Give to Patient   RxID:   [id number] PREDNISONE 5 MG TABS (PREDNISONE) one twice daily for 7 days  #15 x 0   Entered and Authorized by:   Marletta Lor  MD   Signed by:   Marletta Lor  MD on 11/09/2009   Method used:   Electronically to        [facility]  [phone]* (retail)       [place], [place]  [id number]       Ph: [phone] or [phone]       Fax: [phone]   RxID:   [id number]

## 2010-09-23 NOTE — Assessment & Plan Note (Signed)
Summary: cont leg swelling   Vital Signs:  Patient profile:   76 year old male Weight:      193 pounds Temp:     98.5 degrees F oral BP sitting:   130 / 70  (right arm) Cuff size:   regular  Vitals Entered By: Cay Schillings LPN (April 11, 624THL QA348G PM) CC: c/o leg and ankle swelling (R) , c/o knee pain     fbs - 122 Is Patient Diabetic? Yes   CC:  c/o leg and ankle swelling (R)  and c/o knee pain     fbs - 122.  History of Present Illness:  76 year old patient who is seen today for follow-up of his right knee pain.  This has improved.  He still has some very minimal right leg edema, but is also largely has resolved.  He was treated for 7 days with prednisone due to the knee pain. He has a history of mild chronic renal insufficiency, and anti-inflammatories have been avoided.Marland Kitchen  He was seen for follow-up of his hypertension which remained stable.  The right knee discomfort is very minimal and he does not do wish to pursue orthopedic evaluation.  At this time.  An x-ray in the ER revealed only minimal degenerative changes  Preventive Screening-Counseling & Management  Alcohol-Tobacco     Smoking Status: never  Allergies (verified): No Known Drug Allergies  Past History:  Past Medical History: Hyperlipidemia Hypertension Prostate cancer, hx of; status post radical prostatectomy at [facility], 1993 Cerebrovascular disease status post right carotid angioplasty in 2001 iron deficiency anemia Diabetes mellitus, type II CKD (creatinine 1.6) right knee pain  Social History: Smoking Status:  never  Review of Systems  The patient denies anorexia, fever, weight loss, weight gain, vision loss, decreased hearing, hoarseness, chest pain, syncope, dyspnea on exertion, peripheral edema, prolonged cough, headaches, hemoptysis, abdominal pain, melena, hematochezia, severe indigestion/heartburn, hematuria, incontinence, genital sores, muscle weakness, suspicious skin lesions, transient  blindness, difficulty walking, depression, unusual weight change, abnormal bleeding, enlarged lymph nodes, angioedema, breast masses, and testicular masses.    Physical Exam  General:  overweight-appearing.  110/70overweight-appearing.   Msk:  the right knee was still warm to touch.  No localized tenderness.  There was a suggestion of a mild effusion.  No pain with full extension and flexion   Impression & Recommendations:  Problem # 1:  KNEE PAIN, RIGHT (ICD-719.46)  His updated medication list for this problem includes:    Adult Aspirin Ec Low Strength 81 Mg Tbec (Aspirin) .Marland Kitchen... 1 once daily patient may have a meniscal tear.  He is essentially pain-free at present and will clinically observed  His updated medication list for this problem includes:    Adult Aspirin Ec Low Strength 81 Mg Tbec (Aspirin) .Marland Kitchen... 1 once daily  Problem # 2:  HYPERTENSION (ICD-401.9)  His updated medication list for this problem includes:    Caduet 10-20 Mg Tabs (Amlodipine-atorvastatin) .Marland Kitchen... 1 once daily    Cozaar 100 Mg Tabs (Losartan potassium) .Marland Kitchen... 1 once daily  His updated medication list for this problem includes:    Caduet 10-20 Mg Tabs (Amlodipine-atorvastatin) .Marland Kitchen... 1 once daily    Cozaar 100 Mg Tabs (Losartan potassium) .Marland Kitchen... 1 once daily  Complete Medication List: 1)  Analpram-hc 1-2.5 % Crea (Hydrocortisone ace-pramoxine) .... Use as dir 2)  Caduet 10-20 Mg Tabs (Amlodipine-atorvastatin) .Marland Kitchen.. 1 once daily 3)  Nexium 40 Mg Cpdr (Esomeprazole magnesium) .Marland Kitchen.. 1 once daily 4)  Plavix 75 Mg Tabs (Clopidogrel bisulfate) .Marland KitchenMarland KitchenMarland Kitchen  1 once daily 5)  Adult Aspirin Ec Low Strength 81 Mg Tbec (Aspirin) .Marland Kitchen.. 1 once daily 6)  Feratab 300 (60 Fe) Mg Tabs (Ferrous sulfate) .Marland Kitchen.. 1 once daily 7)  Freestyle Lite Strp (Glucose blood) .... Use as dir 8)  Cozaar 100 Mg Tabs (Losartan potassium) .Marland Kitchen.. 1 once daily 9)  Glipizide 5 Mg Xr24h-tab (Glipizide) .... One daily 10)  Potassium Chloride 20 Meq Pack (Potassium  chloride) .Marland Kitchen.. 1 once daily 11)  Prednisone 5 Mg Tabs (Prednisone) .... One twice daily for 7 days  Patient Instructions: 1)  Please schedule a follow-up appointment in 3 months. 2)  Limit your Sodium (Salt). 3)  It is important that you exercise regularly at least 20 minutes 5 times a week. If you develop chest pain, have severe difficulty breathing, or feel very tired , stop exercising immediately and seek medical attention. 4)  You need to lose weight. Consider a lower calorie diet and regular exercise.  5)  Check your blood sugars regularly. If your readings are usually above : or below 70 you should contact our office. 6)  It is important that your Diabetic A1c level is checked every 3 months. 7)  See your eye doctor yearly to check for diabetic eye damage.

## 2010-09-23 NOTE — Progress Notes (Signed)
Summary: need more info  Phone Note From Other Clinic   Caller: peggy with vascular clinic Summary of Call: need to know if there was a  dopper study done to determine CAS - how did Dr. Burnice Logan know there was stenosis please advise     [phone] Initial call taken by: Cay Schillings LPN,  September 20, 624THL 4:49 PM  Follow-up for Phone Call        f/u study after R carotid angioplasty in 2001 Follow-up by: Marletta Lor  MD,  May 11, 2010 4:59 PM  Additional Follow-up for Phone Call Additional follow up Details #1::        attempt to call - after hours - will try tomorrow. KIK Additional Follow-up by: Cay Schillings LPN,  September 20, 624THL 5:05 PM    Additional Follow-up for Phone Call Additional follow up Details #2::    spoke with peggy - gave info given to me by Dr. Burnice Logan - will see if I can locate specfic info. KIK Follow-up by: Cay Schillings LPN,  September 21, 624THL 10:34 AM

## 2010-09-23 NOTE — Consult Note (Signed)
Summary: [facility]  [facility]   Imported By: Laural Benes 07/06/2010 09:24:32  _____________________________________________________________________  External Attachment:    Type:   Image     Comment:   External Document

## 2010-09-23 NOTE — Consult Note (Signed)
Summary: [facility]   [facility]   Imported By: Laural Benes 05/26/2010 13:50:55  _____________________________________________________________________  External Attachment:    Type:   Image     Comment:   External Document

## 2010-09-23 NOTE — Assessment & Plan Note (Signed)
Summary: emp---will fast//ccm   Vital Signs:  Patient profile:   76 year old male Height:      68 inches Weight:      196 pounds BMI:     29.91 Temp:     98.4 degrees F oral BP sitting:   140 / 76  (right arm) Cuff size:   regular  Vitals Entered By: Cay Schillings LPN (January  5, [id number] 9:00 AM) CC: cpx- doing well    fbs 124 Is Patient Diabetic? Yes Did you bring your meter with you today? No   CC:  cpx- doing well    fbs 124.  History of Present Illness: 76 -year-old patient who is seen today for a wellness exam.  He is followed by urology cardiology, as well as vascular surgery.  He has a history of carotid artery stenosis and a recent carotid artery.  Doppler ultrasound in October was negative.  He has treated hypertension, dyslipidemia, and type 2 diabetes.  He has chronic kidney disease.  Here for Medicare AWV:  1.   Risk factors based on Past M, S, F history: cardiovascular risk factors include hypertension, diabetes, and dyslipidemia 2.   Physical Activities: remains fairly active.  Plans on rejoin his [employer] membership 3.   Depression/mood: no history of depression or mood disorder 4.   Hearing: no hearing deficits 5.   ADL's: independent in all aspects of daily living 6.   Fall Risk: low 7.   Home Safety: no problems identified 8.   Height, weight, &visual acuity:height and weight stable.  No change in visual acuity 9.   Counseling: heart healthy diet, exercise, weight loss.  All encouraged 10.   Labs ordered based on risk factors: laboratory profile will be reviewed including hemoglobin A1c and lipid profile 11.           Referral Coordination- follow-up urology next month as planned 12.           Care Plan- heart healthy diet, weight loss encouraged 13.            Cognitive Assessment- alert and oriented, with normal affect.  No cognitive dysfunction   Allergies (verified): No Known Drug Allergies  Past History:  Past Medical History: Reviewed history from  11/30/2009 and no changes required. Hyperlipidemia Hypertension Prostate cancer, hx of; status post radical prostatectomy at [facility], 1993 Cerebrovascular disease status post right carotid angioplasty in 2001 iron deficiency anemia Diabetes mellitus, type II CKD (creatinine 1.6) right knee pain  Past Surgical History: Carotid angioplasty 01 Prostatectomy 93 colonoscopy 2008  carotid Doppler examination October 2011  Family History: Reviewed history from 07/01/2008 and no changes required. father died at  5 of a mesothelioma. mother is 5 has hypertension and history of anemia and 5 brothers two deceased one from prostate cancer and one from coronary artery disease; positive for diabetes two sisters positive for diabetes  Social History: Reviewed history from 07/01/2008 and no changes required. Married retired from [employer] since [place]  The patient denies anorexia, fever, weight loss, weight gain, vision loss, decreased hearing, hoarseness, chest pain, syncope, dyspnea on exertion, peripheral edema, prolonged cough, headaches, hemoptysis, abdominal pain, melena, hematochezia, severe indigestion/heartburn, hematuria, incontinence, genital sores, muscle weakness, suspicious skin lesions, transient blindness, difficulty walking, depression, unusual weight change, abnormal bleeding, enlarged lymph nodes, angioedema, breast masses, and testicular masses.    Physical Exam  General:  overweight-appearing.  140/80overweight-appearing.   Head:  Normocephalic and atraumatic without obvious abnormalities.  No apparent alopecia or balding. Eyes:  No corneal or conjunctival inflammation noted. EOMI. Perrla. Funduscopic exam benign, without hemorrhages, exudates or papilledema. Vision grossly normal. Ears:  External ear exam shows no significant lesions or deformities.  Otoscopic examination reveals clear canals, tympanic membranes are intact bilaterally without bulging,  retraction, inflammation or discharge. Hearing is grossly normal bilaterally. Nose:  External nasal examination shows no deformity or inflammation. Nasal mucosa are pink and moist without lesions or exudates. Mouth:  Oral mucosa and oropharynx without lesions or exudates.  Teeth in good repair. Neck:  No deformities, masses, or tenderness noted. Chest Wall:  No deformities, masses, tenderness or gynecomastia noted. Breasts:  No masses or gynecomastia noted Lungs:  Normal respiratory effort, chest expands symmetrically. Lungs are clear to auscultation, no crackles or wheezes. Heart:  Normal rate and regular rhythm. S1 and S2 normal without gallop, murmur, click, rub or other extra sounds. Abdomen:  Bowel sounds positive,abdomen soft and non-tender without masses, organomegaly or hernias noted. Genitalia:  Testes bilaterally descended without nodularity, tenderness or masses. No scrotal masses or lesions. No penis lesions or urethral discharge. Msk:  No deformity or scoliosis noted of thoracic or lumbar spine.   Pulses:  R and L carotid,radial,femoral,dorsalis pedis and posterior tibial pulses are full and equal bilaterally Extremities:  No clubbing, cyanosis, edema, or deformity noted with normal full range of motion of all joints.   Neurologic:  No cranial nerve deficits noted. Station and gait are normal. Plantar reflexes are down-going bilaterally. DTRs are symmetrical throughout. Sensory, motor and coordinative functions appear intact. Skin:  Intact without suspicious lesions or rashes Cervical Nodes:  No lymphadenopathy noted Axillary Nodes:  No palpable lymphadenopathy Inguinal Nodes:  No significant adenopathy Psych:  Cognition and judgment appear intact. Alert and cooperative with normal attention span and concentration. No apparent delusions, illusions, hallucinations  Diabetes Management Exam:    Foot Exam (with socks and/or shoes not present):       Sensory-Pinprick/Light touch:           Left medial foot (L-4): normal          Left dorsal foot (L-5): normal          Left lateral foot (S-1): normal          Right medial foot (L-4): normal          Right dorsal foot (L-5): normal          Right lateral foot (S-1): normal       Sensory-Monofilament:          Left foot: normal          Right foot: normal       Inspection:          Left foot: normal          Right foot: normal       Nails:          Left foot: normal          Right foot: normal    Foot Exam by Podiatrist:       Date: 08/26/2010       Results: no diabetic findings       Done by: PCP    Eye Exam:       Eye Exam done here today          Results: normal   Impression & Recommendations:  Problem # 1:  Screening PSA (ICD-V76.44)  Complete Medication List: 1)  Analpram-hc 1-2.5 %  Crea (Hydrocortisone ace-pramoxine) .... Use as dir 2)  Caduet 10-20 Mg Tabs (Amlodipine-atorvastatin) .Marland Kitchen.. 1 once daily 3)  Nexium 40 Mg Cpdr (Esomeprazole magnesium) .Marland Kitchen.. 1 once daily 4)  Plavix 75 Mg Tabs (Clopidogrel bisulfate) .Marland Kitchen.. 1 once daily 5)  Adult Aspirin Ec Low Strength 81 Mg Tbec (Aspirin) .Marland Kitchen.. 1 once daily 6)  Feratab 300 (60 Fe) Mg Tabs (Ferrous sulfate) .Marland Kitchen.. 1 once daily 7)  Freestyle Lite Strp (Glucose blood) .... Use as dir 8)  Cozaar 100 Mg Tabs (Losartan potassium) .Marland Kitchen.. 1 once daily 9)  Glipizide 5 Mg Xr24h-tab (Glipizide) .... One daily 10)  Klor-con M20 20 Meq Cr-tabs (Potassium chloride crys cr) .... Qd  Other Orders: Medicare -1st Annual Wellness Visit [phone]) Venipuncture [id number]) TLB-Lipid Panel (80061-LIPID) TLB-BMP (Basic Metabolic Panel-BMET) (99991111) TLB-CBC Platelet - w/Differential (85025-CBCD) TLB-Hepatic/Liver Function Pnl (80076-HEPATIC) TLB-TSH (Thyroid Stimulating Hormone) (84443-TSH) TLB-A1C / Hgb A1C (Glycohemoglobin) (83036-A1C) TLB-Microalbumin/Creat Ratio, Urine (82043-MALB) Specimen Handling (99000)  Patient Instructions: 1)  Please schedule a follow-up appointment  in 3 months. 2)  Limit your Sodium (Salt) to less than 2 grams a day(slightly less than 1/2 a teaspoon) to prevent fluid retention, swelling, or worsening of symptoms. 3)  It is important that you exercise regularly at least 20 minutes 5 times a week. If you develop chest pain, have severe difficulty breathing, or feel very tired , stop exercising immediately and seek medical attention. 4)  You need to lose weight. Consider a lower calorie diet and regular exercise.  5)  Check your blood sugars regularly. If your readings are usually above : or below 70 you should contact our office. 6)  It is important that your Diabetic A1c level is checked every 3 months. 7)  See your eye doctor yearly to check for diabetic eye damage. Prescriptions: KLOR-CON M20 20 MEQ CR-TABS (POTASSIUM CHLORIDE CRYS CR) qd  #100 x 2   Entered and Authorized by:   Marletta Lor  MD   Signed by:   Marletta Lor  MD on 08/26/2010   Method used:   Print then Give to Patient   RxID:   [id number] GLIPIZIDE 5 MG XR24H-TAB (GLIPIZIDE) one daily  #90 Tablet x 2   Entered and Authorized by:   Marletta Lor  MD   Signed by:   Marletta Lor  MD on 08/26/2010   Method used:   Print then Give to Patient   RxID:   [id number] COZAAR 100 MG TABS (LOSARTAN POTASSIUM) 1 once daily  #90 x 2   Entered and Authorized by:   Marletta Lor  MD   Signed by:   Marletta Lor  MD on 08/26/2010   Method used:   Print then Give to Patient   RxID:   [id number] FREESTYLE LITE   STRP (GLUCOSE BLOOD) use as dir  #100 Each x 2   Entered and Authorized by:   Marletta Lor  MD   Signed by:   Marletta Lor  MD on 08/26/2010   Method used:   Print then Give to Patient   RxID:   [id number] PLAVIX 75 MG TABS (CLOPIDOGREL BISULFATE) 1 once daily  #90 x 2   Entered and Authorized by:   Marletta Lor  MD   Signed by:   Marletta Lor  MD on 08/26/2010   Method used:   Print  then Give to Patient   RxID:   [id number] NEXIUM 40 MG CPDR (ESOMEPRAZOLE MAGNESIUM) 1 once  daily  #90 x 5   Entered and Authorized by:   Marletta Lor  MD   Signed by:   Marletta Lor  MD on 08/26/2010   Method used:   Print then Give to Patient   RxID:   [id number] [place] 10-20 MG TABS (AMLODIPINE-ATORVASTATIN) 1 once daily  #90 Tablet x 3   Entered and Authorized by:   Marletta Lor  MD   Signed by:   Marletta Lor  MD on 08/26/2010   Method used:   Print then Give to Patient   RxID:   [id number] KLOR-CON M20 20 MEQ CR-TABS (POTASSIUM CHLORIDE CRYS CR) qd  #100 x 2   Entered and Authorized by:   Marletta Lor  MD   Signed by:   Marletta Lor  MD on 08/26/2010   Method used:   Electronically to        [facility]  [phone]* (retail)       [place], [place]  [address]       Ph: [phone] or [phone]       Fax: [phone]   [id number] GLIPIZIDE 5 MG XR24H-TAB (GLIPIZIDE) one daily  #90 Tablet x 2   Entered and Authorized by:   Marletta Lor  MD   Signed by:   Marletta Lor  MD on 08/26/2010   Method used:   Electronically to        [facility]  [phone]* (retail)       [place], [place]  [address]       Ph: [phone] or [phone]       Fax: [phone]   RxID:   [id number] COZAAR 100 MG TABS (LOSARTAN POTASSIUM) 1 once daily  #90 x 2   Entered and Authorized by:   Marletta Lor  MD   Signed by:   Marletta Lor  MD on 08/26/2010   Method used:   Electronically to        [facility]  [phone]* (retail)       [place], [place]  [address]       Ph: [phone] or [phone]       Fax: [phone]   RxID:   [id number] FREESTYLE LITE   STRP (GLUCOSE BLOOD) use as dir  #100 Each x 2   Entered and Authorized by:   Marletta Lor  MD   Signed by:   Marletta Lor  MD on 08/26/2010    Method used:   Electronically to        [facility]  [phone]* (retail)       [address] [place], [place]  [address]       Ph: [phone] or [phone]       Fax: [phone]   [id number] PLAVIX 75 MG TABS (CLOPIDOGREL BISULFATE) 1 once daily  #90 x 2   Entered and Authorized by:   Marletta Lor  MD   Signed by:   Marletta Lor  MD on 08/26/2010   Method used:   Electronically to        [facility]  [phone]* (retail)       [place],  [place]  [address]       Ph: [phone] or [phone]       Fax: [phone]   [id number] NEXIUM 40 MG CPDR (ESOMEPRAZOLE MAGNESIUM) 1 once daily  #90 x 5   Entered and Authorized by:   Marletta Lor  MD   Signed by:   Marletta Lor  MD on 08/26/2010   Method used:   Electronically to        [facility]  [phone]* (retail)       [place], [place]  [address]       Ph: [phone] or [phone]       Fax: [phone]   RxID:   [id number] CADUET 10-20 MG TABS (AMLODIPINE-ATORVASTATIN) 1 once daily  #90 Tablet x 3   Entered and Authorized by:   Marletta Lor  MD   Signed by:   Marletta Lor  MD on 08/26/2010   Method used:   Electronically to        [facility]  [phone]* (retail)       [place], [place]  [address]       Ph: [phone] or [phone]       Fax: [phone]   RxID:   [id number]    Orders Added: 1)  Medicare -1st Annual Wellness Visit [id number] 2)  Est. Patient Level III [id number] 3)  Venipuncture [id number] 4)  TLB-Lipid Panel [80061-LIPID] 5)  TLB-BMP (Basic Metabolic Panel-BMET) 123456 6)  TLB-CBC Platelet - w/Differential [85025-CBCD] 7)  TLB-Hepatic/Liver Function Pnl [80076-HEPATIC] 8)  TLB-TSH (Thyroid Stimulating Hormone) [84443-TSH] 9)  TLB-A1C / Hgb A1C (Glycohemoglobin) [83036-A1C] 10)  TLB-Microalbumin/Creat Ratio, Urine [82043-MALB] 11)   Specimen Handling [99000]

## 2010-09-23 NOTE — Assessment & Plan Note (Signed)
Summary: 3 MNTH ROV//SLM   Vital Signs:  Patient profile:   76 year old male Weight:      195 pounds Temp:     98.3 degrees F oral BP sitting:   140 / 70  (left arm) Cuff size:   regular  Vitals Entered By: Clearnce Sorrel CMA (February 08, 2010 9:59 AM) CC: 3 mth rov due to high BP Pain Assessment Patient in pain? yes        CC:  3 mth rov due to high BP.  History of Present Illness: 76 year old patient who is seen today for follow-up of his hypertension, chronic kidney disease, dyslipidemia, and type 2 diabetes.  He was seen two months ago with knee pain that has resolved.  He has had a recent ophthalmology exam without evidence of diabetic retinopathy.  His hemoglobin A1c as have been consistently in the 6.5 range.  No new concerns or complaints.  Weight is up modestly.  He has had no hypoglycemic reactions.  Current Medications (verified): 1)  Analpram-Hc 1-2.5 % Crea (Hydrocortisone Ace-Pramoxine) .... Use As Dir 2)  Caduet 10-20 Mg Tabs (Amlodipine-Atorvastatin) .Marland Kitchen.. 1 Once Daily 3)  Nexium 40 Mg Cpdr (Esomeprazole Magnesium) .Marland Kitchen.. 1 Once Daily 4)  Plavix 75 Mg Tabs (Clopidogrel Bisulfate) .Marland Kitchen.. 1 Once Daily 5)  Adult Aspirin Ec Low Strength 81 Mg  Tbec (Aspirin) .Marland Kitchen.. 1 Once Daily 6)  Feratab 300 (60 Fe) Mg  Tabs (Ferrous Sulfate) .Marland Kitchen.. 1 Once Daily 7)  Freestyle Lite   Strp (Glucose Blood) .... Use As Dir 8)  Cozaar 100 Mg Tabs (Losartan Potassium) .Marland Kitchen.. 1 Once Daily 9)  Glipizide 5 Mg Xr24h-Tab (Glipizide) .... One Daily 10)  Potassium Chloride 20 Meq Pack (Potassium Chloride) .Marland Kitchen.. 1 Once Daily 11)  Prednisone 5 Mg Tabs (Prednisone) .... One Twice Daily For 7 Days  Allergies (verified): No Known Drug Allergies  Past History:  Past Medical History: Reviewed history from 11/30/2009 and no changes required. Hyperlipidemia Hypertension Prostate cancer, hx of; status post radical prostatectomy at [facility], 1993 Cerebrovascular disease status post right carotid angioplasty in  2001 iron deficiency anemia Diabetes mellitus, type II CKD (creatinine 1.6) right knee pain  Review of Systems       The patient complains of weight gain.  The patient denies anorexia, fever, weight loss, vision loss, decreased hearing, hoarseness, chest pain, syncope, dyspnea on exertion, peripheral edema, prolonged cough, headaches, hemoptysis, abdominal pain, melena, severe indigestion/heartburn, hematuria, incontinence, genital sores, muscle weakness, suspicious skin lesions, transient blindness, difficulty walking, depression, unusual weight change, abnormal bleeding, enlarged lymph nodes, angioedema, breast masses, and testicular masses.    Physical Exam  General:  overweight-appearing.  140/70overweight-appearing.   Head:  Normocephalic and atraumatic without obvious abnormalities. No apparent alopecia or balding. Eyes:  No corneal or conjunctival inflammation noted. EOMI. Perrla. Funduscopic exam benign, without hemorrhages, exudates or papilledema. Vision grossly normal. Mouth:  Oral mucosa and oropharynx without lesions or exudates.  Teeth in good repair. Neck:  No deformities, masses, or tenderness noted. Lungs:  Normal respiratory effort, chest expands symmetrically. Lungs are clear to auscultation, no crackles or wheezes. Heart:  Normal rate and regular rhythm. S1 and S2 normal without gallop, murmur, click, rub or other extra sounds. Abdomen:  Bowel sounds positive,abdomen soft and non-tender without masses, organomegaly or hernias noted. Msk:  No deformity or scoliosis noted of thoracic or lumbar spine.   Pulses:  R and L carotid,radial,femoral,dorsalis pedis and posterior tibial pulses are full and equal bilaterally Extremities:  No clubbing, cyanosis, edema, or deformity noted with normal full range of motion of all joints.    Diabetes Management Exam:    Foot Exam (with socks and/or shoes not present):       Sensory-Pinprick/Light touch:          Left medial foot (L-4):  normal          Left dorsal foot (L-5): normal          Left lateral foot (S-1): normal          Right medial foot (L-4): normal          Right dorsal foot (L-5): normal          Right lateral foot (S-1): normal       Sensory-Monofilament:          Left foot: normal          Right foot: normal       Inspection:          Left foot: normal          Right foot: normal       Nails:          Left foot: normal    Foot Exam by Podiatrist:       Date: 02/08/2010       Results: no diabetic findings       Done by: PCP    Eye Exam:       Eye Exam done elsewhere          Date: 12/31/2009          Results: normal          Done by: ophthalmology   Impression & Recommendations:  Problem # 1:  RENAL DISEASE, CHRONIC (ICD-593.9)  Problem # 2:  DIABETES MELLITUS, TYPE II (ICD-250.00)  His updated medication list for this problem includes:    Adult Aspirin Ec Low Strength 81 Mg Tbec (Aspirin) .Marland Kitchen... 1 once daily    Cozaar 100 Mg Tabs (Losartan potassium) .Marland Kitchen... 1 once daily    Glipizide 5 Mg Xr24h-tab (Glipizide) ..... One daily  His updated medication list for this problem includes:    Adult Aspirin Ec Low Strength 81 Mg Tbec (Aspirin) .Marland Kitchen... 1 once daily    Cozaar 100 Mg Tabs (Losartan potassium) .Marland Kitchen... 1 once daily    Glipizide 5 Mg Xr24h-tab (Glipizide) ..... One daily  Problem # 3:  HYPERTENSION (ICD-401.9)  His updated medication list for this problem includes:    Caduet 10-20 Mg Tabs (Amlodipine-atorvastatin) .Marland Kitchen... 1 once daily    Cozaar 100 Mg Tabs (Losartan potassium) .Marland Kitchen... 1 once daily  His updated medication list for this problem includes:    Caduet 10-20 Mg Tabs (Amlodipine-atorvastatin) .Marland Kitchen... 1 once daily    Cozaar 100 Mg Tabs (Losartan potassium) .Marland Kitchen... 1 once daily  Complete Medication List: 1)  Analpram-hc 1-2.5 % Crea (Hydrocortisone ace-pramoxine) .... Use as dir 2)  Caduet 10-20 Mg Tabs (Amlodipine-atorvastatin) .Marland Kitchen.. 1 once daily 3)  Nexium 40 Mg Cpdr (Esomeprazole  magnesium) .Marland Kitchen.. 1 once daily 4)  Plavix 75 Mg Tabs (Clopidogrel bisulfate) .Marland Kitchen.. 1 once daily 5)  Adult Aspirin Ec Low Strength 81 Mg Tbec (Aspirin) .Marland Kitchen.. 1 once daily 6)  Feratab 300 (60 Fe) Mg Tabs (Ferrous sulfate) .Marland Kitchen.. 1 once daily 7)  Freestyle Lite Strp (Glucose blood) .... Use as dir 8)  Cozaar 100 Mg Tabs (Losartan potassium) .Marland Kitchen.. 1 once daily 9)  Glipizide 5 Mg Xr24h-tab (Glipizide) .... One  daily 10)  Potassium Chloride 20 Meq Pack (Potassium chloride) .Marland Kitchen.. 1 once daily 11)  Prednisone 5 Mg Tabs (Prednisone) .... One twice daily for 7 days  Patient Instructions: 1)  Please schedule a follow-up appointment in 3 months. 2)  Limit your Sodium (Salt) to less than 2 grams a day(slightly less than 1/2 a teaspoon) to prevent fluid retention, swelling, or worsening of symptoms. 3)  It is important that you exercise regularly at least 20 minutes 5 times a week. If you develop chest pain, have severe difficulty breathing, or feel very tired , stop exercising immediately and seek medical attention. 4)  You need to lose weight. Consider a lower calorie diet and regular exercise.  5)  Check your blood sugars regularly. If your readings are usually above : or below 70 you should contact our office. 6)  It is important that your Diabetic A1c level is checked every 3 months.

## 2010-10-29 ENCOUNTER — Other Ambulatory Visit: Payer: Self-pay | Admitting: Internal Medicine

## 2010-11-12 LAB — CBC
Hemoglobin: 13.5 g/dL (ref 13.0–17.0)
MCHC: 33.9 g/dL (ref 30.0–36.0)
MCV: 83.7 fL (ref 78.0–100.0)

## 2010-11-12 LAB — BASIC METABOLIC PANEL
BUN: 20 mg/dL (ref 6–23)
Calcium: 8.9 mg/dL (ref 8.4–10.5)
Chloride: 104 mEq/L (ref 96–112)
GFR calc Af Amer: 52 mL/min — ABNORMAL LOW (ref >60)

## 2010-11-12 LAB — DIFFERENTIAL
Basophils Absolute: 0 10*3/uL (ref 0.0–0.1)
Basophils Relative: 0 % (ref 0–1)
Lymphs Abs: 1.4 10*3/uL (ref 0.7–4.0)
Neutro Abs: 7.5 10*3/uL (ref 1.7–7.7)

## 2010-11-23 ENCOUNTER — Encounter: Payer: Self-pay | Admitting: Internal Medicine

## 2010-11-24 ENCOUNTER — Encounter: Payer: Self-pay | Admitting: Internal Medicine

## 2010-11-24 ENCOUNTER — Ambulatory Visit (INDEPENDENT_AMBULATORY_CARE_PROVIDER_SITE_OTHER): Payer: Medicare Other | Admitting: Internal Medicine

## 2010-11-24 DIAGNOSIS — N289 Disorder of kidney and ureter, unspecified: Secondary | ICD-10-CM

## 2010-11-24 DIAGNOSIS — I10 Essential (primary) hypertension: Secondary | ICD-10-CM

## 2010-11-24 DIAGNOSIS — E119 Type 2 diabetes mellitus without complications: Secondary | ICD-10-CM

## 2010-11-24 DIAGNOSIS — Z79899 Other long term (current) drug therapy: Secondary | ICD-10-CM

## 2010-11-24 DIAGNOSIS — E785 Hyperlipidemia, unspecified: Secondary | ICD-10-CM

## 2010-11-24 NOTE — Progress Notes (Signed)
  Subjective:    Patient ID: Austin [name], male    DOB: 1935/05/07, 76 y.o.   MRN: [id number]  HPI 76 year old patient who is seen today for followup of his type 2 diabetes. Metformin was discontinued over one year ago due to an elevated creatinine at 2.0. More recently he has been running 1.4. Assessment form and has been discontinued hemoglobin A1c as have been slightly greater than 7. There's been no significant weight loss or dietary changes. Options were discussed today including resuming metformin. The patient wishes to attempt lifestyle changes first. 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 He wishes to hold off on metformin until a 3 month followup hemoglobin A1c. He has treated hypertension which has been stable as well as dyslipidemia. Laboratory studies from 3 months ago were reviewed. He denies any cardiopulmonary complaints    Review of Systems  Constitutional: Negative for fever, chills, appetite change and fatigue.  HENT: Negative for hearing loss, ear pain, congestion, sore throat, trouble swallowing, neck stiffness, dental problem, voice change and tinnitus.   Eyes: Negative for pain, discharge and visual disturbance.  Respiratory: Negative for cough, chest tightness, wheezing and stridor.   Cardiovascular: Negative for chest pain, palpitations and leg swelling.  Gastrointestinal: Negative for nausea, vomiting, abdominal pain, diarrhea, constipation, blood in stool and abdominal distention.  Genitourinary: Negative for urgency, hematuria, flank pain, discharge, difficulty urinating and genital sores.  Musculoskeletal: Negative for myalgias, back pain, joint swelling, arthralgias and gait problem.  Skin: Negative for rash.  Neurological: Negative for dizziness, syncope, speech difficulty, weakness, numbness and headaches.  Hematological: Negative for adenopathy. Does not bruise/bleed easily.  Psychiatric/Behavioral: Negative for behavioral problems and  dysphoric mood. The patient is not nervous/anxious.        Objective:   Physical Exam  Constitutional: He is oriented to person, place, and time. He appears well-developed.  HENT:  Head: Normocephalic.  Right Ear: External ear normal.  Left Ear: External ear normal.  Eyes: Conjunctivae and EOM are normal.  Neck: Normal range of motion.  Cardiovascular: Normal rate and normal heart sounds.   Pulmonary/Chest: Breath sounds normal.  Abdominal: Bowel sounds are normal.  Musculoskeletal: Normal range of motion. He exhibits no edema and no tenderness.  Neurological: He is alert and oriented to person, place, and time.  Psychiatric: He has a normal mood and affect. His behavior is normal.          Assessment & Plan:  Diabetes mellitus. Will check a hemoglobin A1c today and again in 3 months. If he is not at goal in 3 months Will resume metformin therapy if renal status stays normal Dyslipidemia. Will controlled on present statin dose Hypertension well controlled

## 2010-11-24 NOTE — Patient Instructions (Signed)
Please check your hemoglobin A1c every 3 months  Please check your blood pressure on a regular basis.  If it is consistently greater than 150/90, please make an office appointment.  Limit your sodium (Salt) intake    It is important that you exercise regularly, at least 20 minutes 3 to 4 times per week.  If you develop chest pain or shortness of breath seek  medical attention.

## 2011-01-04 NOTE — Procedures (Signed)
CAROTID DUPLEX EXAM   INDICATION:  Carotid artery disease.   HISTORY:  Diabetes:  Yes.  Cardiac:  No.  Hypertension:  Yes.  Smoking:  No.  Previous Surgery:  Right carotid angioplasty in 2001.  CV History:  TIA.  Amaurosis Fugax , Paresthesias , Hemiparesis                                       RIGHT             LEFT  Brachial systolic pressure:  Brachial Doppler waveforms:  Vertebral direction of flow:        Antegrade         Antegrade  DUPLEX VELOCITIES (cm/sec)  CCA peak systolic                   82                58  ECA peak systolic                   90                69  ICA peak systolic                   46                67  ICA end diastolic                   21                28  PLAQUE MORPHOLOGY:                  Intimal wall thickening             Intimal wall thickening  PLAQUE AMOUNT:                      Minimal           Minimal  PLAQUE LOCATION:                    CCA, ICA          CCA, ICA   IMPRESSION:  1. Bilateral internal carotid arteries show no evidence of stenosis.  2. Antegrade flow seen in bilateral vertebral arteries.   ___________________________________________  Jessy Oto Fields, MD   EM/MEDQ  D:  05/27/2010  T:  05/27/2010  Job:  [id number]

## 2011-01-04 NOTE — Assessment & Plan Note (Signed)
OFFICE VISIT   NASCA, Austin [name]  DOB:  29-Mar-1935                                       05/27/2010  [id number]   CHIEF COMPLAINT:  Carotid stenosis.   HISTORY OF PRESENT ILLNESS:  The patient is a 76 year old male referred  by Dr. Burnice Logan for followup of a previous carotid stenosis.  The  patient had an intracranial angioplasty of his petrous portion of his  carotid artery by Dr. Estanislado Pandy in November of 2001.  He had also been  previously seen by Dr. Amedeo Plenty for carotid stenosis in the past from a  previous TIA.  The patient denies any TIA, amaurosis or stroke symptoms  at least since 2008.  He denies any symptoms of TIA, amaurosis or  stroke.  He states that he did not actually have symptoms from previous  stroke but apparently this was seen on a brain scan.   Chronic medical problems include hypertension, elevated cholesterol and  diabetes.  These are all currently controlled and followed by Dr.  Burnice Logan.   Past medical history is otherwise remarkable for prostate cancer for  which he had a prostatectomy in 1993.  He is currently followed by Dr.  Terance Hart for this.   SOCIAL HISTORY:  He is a retired Environmental consultant.  He has one child.  He  is a former smoker but quit in 1976.  He does not consume alcohol  regularly.   FAMILY HISTORY:  Unremarkable.   REVIEW OF SYSTEMS:  12 point review of systems was performed with the  patient day.  Please see intake referral form for details regarding  this.   Approximately 30 pages of medical records including x-ray reports and  previous office visits from Dr. Truddie Hidden office were reviewed  today.   The patient's medications include enalapril, Caduet, Nexium, Plavix,  adult aspirin 81 mg, iron sulfate, Cozaar, glipizide, potassium  chloride, prednisone.   PHYSICAL EXAM:  Blood pressure is 152/90 in the right arm, 162/85 in the  left arm, heart rate 71 and regular.  HEENT:  Unremarkable.   Neck:  Has  2+ carotid pulses without bruit.  Chest:  Clear to auscultation.  Cardiac:  Regular rate and rhythm without murmur.  Abdomen:  Soft,  nontender, nondistended.  No masses.  Musculoskeletal:  He has no  significant major joint deformities.  Extremities:  He has 2+ radial,  femoral, dorsalis pedis and posterior tibial pulses bilaterally.  Skin:  Has no open ulcers or rashes.  Neurologic:  Shows symmetric upper  extremity and lower extremity motor strength which is 5/5 and symmetric.   He had a bilateral carotid duplex exam today which showed no significant  cervical carotid stenosis.  He also had antegrade vertebral flow  bilaterally.   In summary, the patient previously had an intracranial angioplasty which  is not approachable from a cervical approach.  He has no significant  cervical carotid occlusive disease at this point.  I believe the best  option for him due to his risk factors would be a repeat carotid duplex  exam in two years' time.  Otherwise he will follow up with [place] on an as-  needed basis.  We will schedule him for that  2 year follow up carotid  duplex.     Jessy Oto. Fields, MD  Electronically Signed   CEF/MEDQ  D:  05/27/2010  T:  05/28/2010  Job:  [id number]   cc:   Marletta Lor, MD

## 2011-01-07 NOTE — Consult Note (Signed)
NAMEBRITTEN, Austin [name]                  ACCOUNT NO.:  [id number]   MEDICAL RECORD NO.:  [id number]          PATIENT TYPE:  OUT   LOCATION:  XRAY                         FACILITY:  [facility]   PHYSICIAN:  Sanjeev K. Deveshwar, M.D.DATE OF BIRTH:  Nov 07, 1934   DATE OF CONSULTATION:  11/30/2005  DATE OF DISCHARGE:                                   CONSULTATION   CHIEF COMPLAINT:  Cerebrovascular disease.   HISTORY OF PRESENT ILLNESS:  This is a 76 year old male with a history of  right amaurosis fugax in 2001.  An angiogram was performed, at that time,  that showed a high-grade right internal carotid artery stenosis.  The  patient underwent PTA stenting of that stenosis on 07/12/2000 by Dr.  Estanislado Pandy.  Unfortunately the patient was lost to followup, until just  recently, when he developed symptoms of presyncope while turning his head.  He contacted our office and MRI/MRA was performed on 11/25/2005.  This  showed a moderate stenosis of the right internal carotid artery at the  previous PTA site.  There was also a possible high-grade stenosis at the  left M-1 segment with a possible 50% stenosis at the mid basilar artery.  The patient was scheduled for a consult and presents, today, with his wife  for further evaluation.   PAST MEDICAL HISTORY:  Is significant for hyperlipidemia, hypertension,  remote peptic ulcer disease, history gastroesophageal reflux disease,  history of prostate cancer followed by Dr. Terance Hart. He has seasonal  allergies.  He had a Cardiolite performed in September 2004, this showed no  infarct or ischemia.  Ejection fraction was 56%.   SURGICAL HISTORY:  Significant for prostate surgery in 1993 secondary to  cancer.   ALLERGIES:  ASPIRIN in high doses causes stomach upset, although he is able  to tolerate aspirin 81 mg daily.   CURRENT MEDICATIONS:  Include Caduet, Altace, Nexium, aspirin 81 mg daily,  hydrochlorothiazide daily, and Plavix 75 mg daily.   SOCIAL  HISTORY:  The patient is married.  He has one son.  He lives in  [place].  He quit smoking in 1976.  He did smoke a pack a day for at  least 15 years.  He denies alcohol use.  He is a retired Community education officer.   FAMILY HISTORY:  His mother is alive at age 68.  She has mild hypertension.  She is otherwise alive and well.  His father died at age 11 from  mesothelioma he worked in a shipyard.   IMPRESSION/PLAN:  As noted, this patient has a previous history  cerebrovascular disease with a previous right internal carotid artery PTA  performed June 23, 2000 by Dr. Estanislado Pandy.  He recently developed  presyncope while turning his head and an MRI/MRA was performed; it was  abnormal.  The patient presents, today, with his wife to discuss these  findings.   Dr. Estanislado Pandy reviewed the patient's recent study with the patient and his  wife.  He pointed out the areas of concern, he recommended a repeat cerebral  angiogram with possible PTA stenting, if felt to  be indicated and safe at  the time of the angiogram.  The patient was to go home and give this matter  some more thought and let us know if he would like to have these studies  scheduled.  He is to stay on his aspirin and Plavix in the meantime.  He was  told  to call us if he has any new symptoms.  The risks and benefits of the  procedure were discussed and all of the patient's and his wife's questions  were answered.   Greater than 40 minutes was spent on this consult.      Mikey Bussing, P.A.    ______________________________  Fritz Pickerel. Estanislado Pandy, M.D.    DR/MEDQ  D:  11/30/2005  T:  11/30/2005  Job:  [id number]   cc:   Ardyth Gal. Spruill, M.D.  Fax: [phone]   Loralee Pacas. Sharlett Iles, M.D. [facility]  [address]  [place] [address]

## 2011-01-07 NOTE — Assessment & Plan Note (Signed)
[facility] OFFICE NOTE   NAME:Austin [name]                           MRN:          [id number]  DATE:12/05/2006                            DOB:          04-23-1935    Austin [name] really has no GI complaints except for intermittent epigastric pain  which he relates to eating spicy foods and he really has minimal reflux  on Nexium 40 mg a day. He has continued cerebral problems, has had  surgery for aneurysms and in under a neurosurgeons care and is on Plavix  75 mg a day and aspirin daily. He received a letter for colon recall  that was last done 5 years ago and was entirely unremarkable. He is  having no rectal bleeding and has no family history of colon carcinoma.   He weighs 196 pounds, and blood pressure is 122/64, and pulse is 76 and  regular.  ABDOMINAL EXAM: Entirely unremarkable without organomegaly, masses or  tenderness. Bowel sounds were normal.   ASSESSMENT:  1. Well controlled acid reflux disease.  2. Intermittent hemorrhoidal bleeding with previous negative      colonoscopy.  3. Previous adenocarcinoma of the prostate with radical prostatectomy.  4. Chronic hepatitis B carrier.  5. Hypertensive cardiovascular disease followed by Dr. Montez [name].  6. History of cerebral vascular aneurysms.   RECOMMENDATIONS:  1. Continue Nexium and reflux regimen.  2. Outpatient Hemoccult cards. I do think he needs colonoscopy      routinely unless these are guaiac positive.  3. Continue follow up with his other multiple physicians as planned.     Austin [name]. Austin Iles, MD, Austin [name], Austin [name]  Electronically Signed    DRP/MedQ  DD: 12/05/2006  DT: 12/05/2006  Job #: [id number]   cc:   Austin [name], M.D.  Austin [name], M.D.

## 2011-01-28 ENCOUNTER — Other Ambulatory Visit: Payer: Self-pay | Admitting: Internal Medicine

## 2011-02-01 ENCOUNTER — Encounter: Payer: Self-pay | Admitting: Internal Medicine

## 2011-02-04 ENCOUNTER — Other Ambulatory Visit: Payer: Self-pay | Admitting: Internal Medicine

## 2011-03-02 ENCOUNTER — Ambulatory Visit: Payer: Medicare Other | Admitting: Internal Medicine

## 2011-04-06 ENCOUNTER — Ambulatory Visit (INDEPENDENT_AMBULATORY_CARE_PROVIDER_SITE_OTHER): Payer: Medicare Other | Admitting: Internal Medicine

## 2011-04-06 ENCOUNTER — Encounter: Payer: Self-pay | Admitting: Internal Medicine

## 2011-04-06 DIAGNOSIS — I10 Essential (primary) hypertension: Secondary | ICD-10-CM

## 2011-04-06 DIAGNOSIS — E119 Type 2 diabetes mellitus without complications: Secondary | ICD-10-CM

## 2011-04-06 DIAGNOSIS — N289 Disorder of kidney and ureter, unspecified: Secondary | ICD-10-CM

## 2011-04-06 LAB — HEMOGLOBIN A1C: Hgb A1c MFr Bld: 6.7 % — ABNORMAL HIGH (ref 4.6–6.5)

## 2011-04-06 NOTE — Patient Instructions (Addendum)
Limit your sodium (Salt) intake    It is important that you exercise regularly, at least 20 minutes 3 to 4 times per week.  If you develop chest pain or shortness of breath seek  medical attention.    Follow cardiology within the next 4-6 weeks

## 2011-04-07 ENCOUNTER — Encounter: Payer: Self-pay | Admitting: Internal Medicine

## 2011-04-07 NOTE — Progress Notes (Signed)
  Subjective:    Patient ID: Austin [name], male    DOB: [birth year], 76 y.o.   MRN: [id number]  HPI 76 year old patient who is seen today for followup of his type 2 diabetes. His last hemoglobin A1c 7.3 he seems to maintain eyes glycemic control fasting blood sugar 123 he does have some history of chronic kidney disease and his last creatinine was 1.5. Blood pressure on arrival today 160/100 he is scheduled for cardiology followup soon. He feels well today he does have some osteoarthritis he complains of some mild generalized stiffness his diabetes remains controlled on glipizide 5 mg daily.   Review of Systems  Constitutional: Negative for fever, chills, appetite change and fatigue.  HENT: Negative for hearing loss, ear pain, congestion, sore throat, trouble swallowing, neck stiffness, dental problem, voice change and tinnitus.   Eyes: Negative for pain, discharge and visual disturbance.  Respiratory: Negative for cough, chest tightness, wheezing and stridor.   Cardiovascular: Negative for chest pain, palpitations and leg swelling.  Gastrointestinal: Negative for nausea, vomiting, abdominal pain, diarrhea, constipation, blood in stool and abdominal distention.  Genitourinary: Negative for urgency, hematuria, flank pain, discharge, difficulty urinating and genital sores.  Musculoskeletal: Negative for myalgias, back pain, joint swelling, arthralgias and gait problem.  Skin: Negative for rash.  Neurological: Negative for dizziness, syncope, speech difficulty, weakness, numbness and headaches.  Hematological: Negative for adenopathy. Does not bruise/bleed easily.  Psychiatric/Behavioral: Negative for behavioral problems and dysphoric mood. The patient is not nervous/anxious.        Objective:   Physical Exam  Constitutional: He is oriented to person, place, and time. He appears well-developed.  HENT:  Head: Normocephalic.  Right Ear: External ear normal.  Left Ear: External ear normal.  Eyes:  Conjunctivae and EOM are normal.  Neck: Normal range of motion.  Cardiovascular: Normal rate and normal heart sounds.   Pulmonary/Chest: Breath sounds normal.  Abdominal: Bowel sounds are normal.  Musculoskeletal: Normal range of motion. He exhibits no edema and no tenderness.  Neurological: He is alert and oriented to person, place, and time.  Psychiatric: He has a normal mood and affect. His behavior is normal.          Assessment & Plan:   Diabetes mellitus. We'll check a hemoglobin A1c lifestyle issues discussed and encouraged. Presently remains on glipizide only Hypertension suboptimal control repeat blood pressure 160/80. He is scheduled for cardiology followup in the next 4-6 weeks we'll reassess his blood pressure at that time. He has been asked to  return here in 4 weeks if he does not have a cardiology followup to reassess his blood pressure otherwise return in 3 months for followup Dyslipidemia stable Chronic kidney disease. Last creatinine 1.5 can consider metformin therapy in the future if necessary

## 2011-04-17 ENCOUNTER — Other Ambulatory Visit: Payer: Self-pay | Admitting: Internal Medicine

## 2011-05-22 ENCOUNTER — Other Ambulatory Visit: Payer: Self-pay | Admitting: Internal Medicine

## 2011-05-27 ENCOUNTER — Ambulatory Visit (INDEPENDENT_AMBULATORY_CARE_PROVIDER_SITE_OTHER): Payer: Medicare Other

## 2011-05-27 DIAGNOSIS — Z23 Encounter for immunization: Secondary | ICD-10-CM

## 2011-06-15 ENCOUNTER — Other Ambulatory Visit: Payer: Self-pay | Admitting: Internal Medicine

## 2011-07-07 ENCOUNTER — Ambulatory Visit: Payer: Medicare Other | Admitting: Internal Medicine

## 2011-07-11 ENCOUNTER — Ambulatory Visit (INDEPENDENT_AMBULATORY_CARE_PROVIDER_SITE_OTHER): Payer: Medicare Other | Admitting: Internal Medicine

## 2011-07-11 ENCOUNTER — Encounter: Payer: Self-pay | Admitting: Internal Medicine

## 2011-07-11 DIAGNOSIS — I6789 Other cerebrovascular disease: Secondary | ICD-10-CM

## 2011-07-11 DIAGNOSIS — E119 Type 2 diabetes mellitus without complications: Secondary | ICD-10-CM

## 2011-07-11 DIAGNOSIS — E785 Hyperlipidemia, unspecified: Secondary | ICD-10-CM

## 2011-07-11 DIAGNOSIS — I6529 Occlusion and stenosis of unspecified carotid artery: Secondary | ICD-10-CM

## 2011-07-11 DIAGNOSIS — Z9911 Dependence on respirator [ventilator] status: Secondary | ICD-10-CM

## 2011-07-11 DIAGNOSIS — N289 Disorder of kidney and ureter, unspecified: Secondary | ICD-10-CM

## 2011-07-11 DIAGNOSIS — I10 Essential (primary) hypertension: Secondary | ICD-10-CM

## 2011-07-11 NOTE — Assessment & Plan Note (Signed)
Hypertension. Well controlled we'll continue present regimen exercise restricted salt diet all encouraged

## 2011-07-11 NOTE — Assessment & Plan Note (Signed)
Cerebrovascular disease asymptomatic. We'll continue aggressive lifestyle issues. Low salt diet and daily aspirin recommended

## 2011-07-11 NOTE — Progress Notes (Signed)
Subjective:    Patient ID: Austin [name], male    DOB: [birth year], 76 y.o.   MRN: [id number]  HPI  76 year old patient who is seen today for followup of his multiple medical problems. This includes type 2 diabetes. He has maintained very nice glycemic control. Lab Results  Component Value Date   HGBA1C 6.7* 04/06/2011   He has treated hypertension and dyslipidemia he remains on Caduet as well as Cozaar. Diabetes is controlled with glipizide. Denies any cardiopulmonary complaints. He does have a history of carotid artery disease as well as cerebrovascular disease. Denies any focal neurological symptoms. He   Past Medical History  Diagnosis Date  . 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 CAROTID ARTERY STENOSIS 05/10/2010  . DIABETES MELLITUS, TYPE II 09/19/2007  . DISEASE, CEREBROVASCULAR NEC 03/05/2007  . GERD 03/13/2007  . HYPERLIPIDEMIA 03/05/2007  . HYPERTENSION 03/05/2007  . HYPOKALEMIA 11/09/2009  . KNEE PAIN, RIGHT 11/09/2009  . PROSTATE CANCER, HX OF 03/05/2007  . RENAL DISEASE, CHRONIC 02/03/2009    History   Social History  . Marital Status: Married    Spouse Name: N/A    Number of Children: N/A  . Years of Education: N/A   Occupational History  . Not on file.   Social History Main Topics  . Smoking status: Former Smoker    Quit date: 08/22/1974  . Smokeless tobacco: Never Used  . Alcohol Use: No  . Drug Use: No  . Sexually Active: Not on file   Other Topics Concern  . Not on file   Social History Narrative  . No narrative on file    Past Surgical History  Procedure Date  . Carotid artery angioplasty   . Prostate surgery     prostatectomy    No family history on file.  No Known Allergies  Current Outpatient Prescriptions on File Prior to Visit  Medication Sig Dispense Refill  . aspirin 81 MG tablet Take 81 mg by mouth daily.        Marland Kitchen CADUET 10-20 MG per tablet TAKE 1 TABLET EVERY DAY  90 tablet  3  . clopidogrel (PLAVIX) 75 MG tablet TAKE 1 TABLET  EVERY DAY  100 tablet  2  . Ferrous Sulfate (FERATAB) 300 (60 FE) MG TABS Take 1 tablet by mouth daily.        Marland Kitchen FREESTYLE LITE test strip USE AS DIRECTED  100 each  2  . glipiZIDE (GLUCOTROL) 5 MG 24 hr tablet TAKE 1 TABLET BY MOUTH EVERY DAY  90 tablet  2  . hydrocortisone-pramoxine (ANALPRAM-HC) 2.5-1 % rectal cream USE AS DIRECTED  28.35 g  1  . losartan (COZAAR) 100 MG tablet TAKE 1 TABLET EVERY DAY  90 tablet  2  . NEXIUM 40 MG capsule TAKE ONE CAPSULE EVERY DAY  90 capsule  3  . potassium chloride SA (K-DUR,KLOR-CON) 20 MEQ tablet Take 20 mEq by mouth daily.          BP 118/70  Temp(Src) 98.6 F (37 C) (Oral)  Wt 191 lb (86.637 kg)      Review of Systems  Constitutional: Negative for fever, chills, appetite change and fatigue.  HENT: Negative for hearing loss, ear pain, congestion, sore throat, trouble swallowing, neck stiffness, dental problem, voice change and tinnitus.   Eyes: Negative for pain, discharge and visual disturbance.  Respiratory: Negative for cough, chest tightness, wheezing and stridor.   Cardiovascular: Negative for chest pain, palpitations and leg swelling.  Gastrointestinal: Negative for  nausea, vomiting, abdominal pain, diarrhea, constipation, blood in stool and abdominal distention.  Genitourinary: Negative for urgency, hematuria, flank pain, discharge, difficulty urinating and genital sores.  Musculoskeletal: Negative for myalgias, back pain, joint swelling, arthralgias and gait problem.  Skin: Negative for rash.  Neurological: Negative for dizziness, syncope, speech difficulty, weakness, numbness and headaches.  Hematological: Negative for adenopathy. Does not bruise/bleed easily.  Psychiatric/Behavioral: Negative for behavioral problems and dysphoric mood. The patient is not nervous/anxious.        Objective:   Physical Exam  Constitutional: He is oriented to person, place, and time. He appears well-developed.  HENT:  Head: Normocephalic.  Right  Ear: External ear normal.  Left Ear: External ear normal.  Eyes: Conjunctivae and EOM are normal.  Neck: Normal range of motion.  Cardiovascular: Normal rate and normal heart sounds.   Pulmonary/Chest: Breath sounds normal.  Abdominal: Bowel sounds are normal.  Musculoskeletal: Normal range of motion. He exhibits no edema and no tenderness.  Neurological: He is alert and oriented to person, place, and time.  Psychiatric: He has a normal mood and affect. His behavior is normal.          Assessment & Plan:     DM2-  HTN  HLD  CKD

## 2011-07-11 NOTE — Assessment & Plan Note (Signed)
Diabetes mellitus. We'll check a hemoglobin A1c. Exercise low salt diet encouraged

## 2011-07-11 NOTE — Patient Instructions (Signed)
Please check your hemoglobin A1c every 3 months    It is important that you exercise regularly, at least 20 minutes 3 to 4 times per week.  If you develop chest pain or shortness of breath seek  medical attention.  You need to lose weight.  Consider a lower calorie diet and regular exercise.  Return in 3 months for follow-up

## 2011-08-01 ENCOUNTER — Other Ambulatory Visit: Payer: Self-pay | Admitting: Internal Medicine

## 2011-08-17 ENCOUNTER — Other Ambulatory Visit: Payer: Self-pay | Admitting: Internal Medicine

## 2011-09-30 ENCOUNTER — Telehealth: Payer: Self-pay | Admitting: Family Medicine

## 2011-09-30 NOTE — Telephone Encounter (Signed)
Spoke with pt- no samples at this time on any PPI

## 2011-09-30 NOTE — Telephone Encounter (Signed)
Pt picked up his Nexium Rx at pharmacy last time. He cannot find it anywhere - lost the meds. He is wondering if he can get samples to get him through til he refills it next time. Please call to advise. Thanks!

## 2011-10-10 ENCOUNTER — Other Ambulatory Visit: Payer: Self-pay | Admitting: Internal Medicine

## 2011-10-11 ENCOUNTER — Encounter: Payer: Self-pay | Admitting: Internal Medicine

## 2011-10-11 ENCOUNTER — Ambulatory Visit (INDEPENDENT_AMBULATORY_CARE_PROVIDER_SITE_OTHER): Payer: Medicare Other | Admitting: Internal Medicine

## 2011-10-11 DIAGNOSIS — E876 Hypokalemia: Secondary | ICD-10-CM

## 2011-10-11 DIAGNOSIS — I10 Essential (primary) hypertension: Secondary | ICD-10-CM

## 2011-10-11 DIAGNOSIS — E119 Type 2 diabetes mellitus without complications: Secondary | ICD-10-CM

## 2011-10-11 DIAGNOSIS — N289 Disorder of kidney and ureter, unspecified: Secondary | ICD-10-CM

## 2011-10-11 DIAGNOSIS — E785 Hyperlipidemia, unspecified: Secondary | ICD-10-CM

## 2011-10-11 DIAGNOSIS — I6529 Occlusion and stenosis of unspecified carotid artery: Secondary | ICD-10-CM

## 2011-10-11 DIAGNOSIS — Z8546 Personal history of malignant neoplasm of prostate: Secondary | ICD-10-CM

## 2011-10-11 LAB — LIPID PANEL
HDL: 32.5 mg/dL — ABNORMAL LOW (ref >39.00)
LDL Cholesterol: 78 mg/dL (ref 0–99)
Total CHOL/HDL Ratio: 4
Triglycerides: 72 mg/dL (ref 0.0–149.0)
VLDL: 14.4 mg/dL (ref 0.0–40.0)

## 2011-10-11 LAB — COMPREHENSIVE METABOLIC PANEL
ALT: 10 U/L (ref 0–53)
AST: 17 U/L (ref 0–37)
Alkaline Phosphatase: 85 U/L (ref 39–117)
BUN: 17 mg/dL (ref 6–23)
Chloride: 104 mEq/L (ref 96–112)
Creatinine, Ser: 1.3 mg/dL (ref 0.4–1.5)
Total Bilirubin: 0.6 mg/dL (ref 0.3–1.2)

## 2011-10-11 LAB — CBC WITH DIFFERENTIAL/PLATELET
Basophils Relative: 0.3 % (ref 0.0–3.0)
Eosinophils Absolute: 0.1 10*3/uL (ref 0.0–0.7)
Eosinophils Relative: 1.4 % (ref 0.0–5.0)
HCT: 44.7 % (ref 39.0–52.0)
Hemoglobin: 14.6 g/dL (ref 13.0–17.0)
MCHC: 32.7 g/dL (ref 30.0–36.0)
MCV: 84.2 fl (ref 78.0–100.0)
Monocytes Absolute: 0.4 10*3/uL (ref 0.1–1.0)
Neutro Abs: 5.6 10*3/uL (ref 1.4–7.7)
RBC: 5.31 Mil/uL (ref 4.22–5.81)
WBC: 7.6 10*3/uL (ref 4.5–10.5)

## 2011-10-11 LAB — MICROALBUMIN / CREATININE URINE RATIO: Creatinine,U: 126.4 mg/dL

## 2011-10-11 MED ORDER — AMLODIPINE-ATORVASTATIN 10-20 MG PO TABS: 1.0000 | ORAL_TABLET | Freq: Every day | ORAL | Status: DC

## 2011-10-11 MED ORDER — LOSARTAN POTASSIUM 100 MG PO TABS: 100.0000 mg | ORAL_TABLET | Freq: Every day | ORAL | Status: DC

## 2011-10-11 MED ORDER — POTASSIUM CHLORIDE CRYS ER 20 MEQ PO TBCR: 20.0000 meq | EXTENDED_RELEASE_TABLET | Freq: Every day | ORAL | Status: DC

## 2011-10-11 MED ORDER — CLOPIDOGREL BISULFATE 75 MG PO TABS: 75.0000 mg | ORAL_TABLET | Freq: Every day | ORAL | Status: DC

## 2011-10-11 MED ORDER — GLUCOSE BLOOD VI STRP: ORAL_STRIP | Status: DC

## 2011-10-11 MED ORDER — GLIPIZIDE ER 5 MG PO TB24: 5.0000 mg | ORAL_TABLET | Freq: Every day | ORAL | Status: DC

## 2011-10-11 MED ORDER — ESOMEPRAZOLE MAGNESIUM 40 MG PO CPDR: 40.0000 mg | DELAYED_RELEASE_CAPSULE | Freq: Every day | ORAL | Status: DC

## 2011-10-11 NOTE — Patient Instructions (Signed)
Limit your sodium (Salt) intake    It is important that you exercise regularly, at least 20 minutes 3 to 4 times per week.  If you develop chest pain or shortness of breath seek  medical attention.  You need to lose weight.  Consider a lower calorie diet and regular exercise.   Please check your hemoglobin A1c every 3 months   

## 2011-10-11 NOTE — Progress Notes (Signed)
Subjective:    Patient ID: Austin [name], male    DOB: 08/13/1935, 77 y.o.   MRN: [id number]  HPI  History of Present Illness:   77 year-old patient who is seen today for a wellness exam. He is followed by urology cardiology, as well as vascular surgery. He has a history of carotid artery stenosis and a recent carotid artery. Doppler ultrasound in October was negative. He has treated hypertension, dyslipidemia, and type 2 diabetes. He has chronic kidney disease.   He is followed by cardiology and ophthalmology. He has had a recent eye exam and is also followed by a retinal specialist. Last colonoscopy approximately 4 and half years ago  Here for Medicare AWV:   1. Risk factors based on Past M, S, F history: cardiovascular risk factors include hypertension, diabetes, and dyslipidemia  2. Physical Activities: remains fairly active. Plans on rejoin his [employer] membership  3. Depression/mood: no history of depression or mood disorder  4. Hearing: no hearing deficits  5. ADL's: independent in all aspects of daily living  6. Fall Risk: low  7. Home Safety: no problems identified  8. Height, weight, &visual acuity:height and weight stable. No change in visual acuity  9. Counseling: heart healthy diet, exercise, weight loss. All encouraged  10. Labs ordered based on risk factors: laboratory profile will be reviewed including hemoglobin A1c and lipid profile  11. Referral Coordination- follow-up urology next month as planned  12. Care Plan- heart healthy diet, weight loss encouraged  13. Cognitive Assessment- alert and oriented, with normal affect. No cognitive dysfunction   Allergies (verified):  No Known Drug Allergies   Past History:  Past Medical History:  Reviewed history from 11/30/2009 and no changes required.  Hyperlipidemia  Hypertension  Prostate cancer, hx of; status post radical prostatectomy at [facility] 1993  Cerebrovascular disease status post right carotid angioplasty in 2001  iron  deficiency anemia  Diabetes mellitus, type II  CKD (creatinine 1.6)  right knee pain   Past Surgical History:  Carotid angioplasty 01  Prostatectomy 93  colonoscopy 2008  carotid Doppler examination October 2011   Family History:  Reviewed history from 07/01/2008 and no changes required.   father died at 2 of a mesothelioma. mother is 29 has hypertension and history of anemia and 5 brothers two deceased one from prostate cancer and one from coronary artery disease; positive for diabetes  two sisters positive for diabetes   Social History:  Reviewed history from 07/01/2008 and no changes required.  Married  retired from [employer] since [place]: Negative for fever, chills, activity change, appetite change and fatigue.  HENT: Negative for hearing loss, ear pain, congestion, rhinorrhea, sneezing, mouth sores, trouble swallowing, neck pain, neck stiffness, dental problem, voice change, sinus pressure and tinnitus.   Eyes: Negative for photophobia, pain, redness and visual disturbance.  Respiratory: Negative for apnea, cough, choking, chest tightness, shortness of breath and wheezing.   Cardiovascular: Negative for chest pain, palpitations and leg swelling.  Gastrointestinal: Negative for nausea, vomiting, abdominal pain, diarrhea, constipation, blood in stool, abdominal distention, anal bleeding and rectal pain.  Genitourinary: Negative for dysuria, urgency, frequency, hematuria, flank pain, decreased urine volume, discharge, penile swelling, scrotal swelling, difficulty urinating, genital sores and testicular pain.  Musculoskeletal: Negative for myalgias, back pain, joint swelling, arthralgias and gait problem.  Skin: Negative for color change, rash and wound.  Neurological: Negative for dizziness, tremors, seizures, syncope, facial asymmetry, speech difficulty, weakness, light-headedness,  numbness and headaches.  Hematological: Negative for  adenopathy. Does not bruise/bleed easily.  Psychiatric/Behavioral: Negative for suicidal ideas, hallucinations, behavioral problems, confusion, sleep disturbance, self-injury, dysphoric mood, decreased concentration and agitation. The patient is not nervous/anxious.        Objective:   Physical Exam  Constitutional: He appears well-developed and well-nourished.  HENT:  Head: Normocephalic and atraumatic.  Right Ear: External ear normal.  Left Ear: External ear normal.  Nose: Nose normal.  Mouth/Throat: Oropharynx is clear and moist.  Eyes: Conjunctivae and EOM are normal. Pupils are equal, round, and reactive to light. No scleral icterus.  Neck: Normal range of motion. Neck supple. No JVD present. No thyromegaly present.  Cardiovascular: Regular rhythm, normal heart sounds and intact distal pulses.  Exam reveals no gallop and no friction rub.   No murmur heard. Pulmonary/Chest: Effort normal and breath sounds normal. He exhibits no tenderness.  Abdominal: Soft. Bowel sounds are normal. He exhibits no distension and no mass. There is no tenderness.  Genitourinary: Prostate normal and penis normal.  Musculoskeletal: Normal range of motion. He exhibits no edema and no tenderness.  Lymphadenopathy:    He has no cervical adenopathy.  Neurological: He is alert. He has normal reflexes. No cranial nerve deficit. Coordination normal.  Skin: Skin is warm and dry. No rash noted.  Psychiatric: He has a normal mood and affect. His behavior is normal.          Assessment & Plan:    Preventive health examination Diabetes mellitus type 2. We'll check a hemoglobin A1c weight loss exercise encouraged Dyslipidemia. We'll check a lipid profile Chronic kidney disease and this is will be checked urine for microalbumin will also be checked Hypertension stable

## 2011-10-17 ENCOUNTER — Other Ambulatory Visit: Payer: Self-pay | Admitting: Internal Medicine

## 2012-01-02 ENCOUNTER — Ambulatory Visit (INDEPENDENT_AMBULATORY_CARE_PROVIDER_SITE_OTHER): Payer: Medicare Other | Admitting: Family

## 2012-01-02 ENCOUNTER — Encounter: Payer: Self-pay | Admitting: Family

## 2012-01-02 VITALS — BP 150/80 | Temp 103.0°F | Wt 190.0 lb

## 2012-01-02 DIAGNOSIS — R066 Hiccough: Secondary | ICD-10-CM

## 2012-01-02 DIAGNOSIS — A09 Infectious gastroenteritis and colitis, unspecified: Secondary | ICD-10-CM

## 2012-01-02 DIAGNOSIS — R509 Fever, unspecified: Secondary | ICD-10-CM

## 2012-01-02 DIAGNOSIS — A084 Viral intestinal infection, unspecified: Secondary | ICD-10-CM

## 2012-01-02 LAB — CBC WITH DIFFERENTIAL/PLATELET
Basophils Relative: 0 % (ref 0.0–3.0)
Eosinophils Absolute: 0 10*3/uL (ref 0.0–0.7)
Eosinophils Relative: 0.3 % (ref 0.0–5.0)
HCT: 41.7 % (ref 39.0–52.0)
Lymphs Abs: 0.4 10*3/uL — ABNORMAL LOW (ref 0.7–4.0)
MCHC: 33.3 g/dL (ref 30.0–36.0)
MCV: 83 fl (ref 78.0–100.0)
Monocytes Absolute: 0.9 10*3/uL (ref 0.1–1.0)
Neutrophils Relative %: 89 % — ABNORMAL HIGH (ref 43.0–77.0)
Platelets: 239 10*3/uL (ref 150.0–400.0)
WBC: 11.9 10*3/uL — ABNORMAL HIGH (ref 4.5–10.5)

## 2012-01-02 NOTE — Patient Instructions (Signed)
Viral Gastroenteritis Viral gastroenteritis is also known as stomach flu. This condition affects the stomach and intestinal tract. It can cause sudden diarrhea and vomiting. The illness typically lasts 3 to 8 days. Most people develop an immune response that eventually gets rid of the virus. While this natural response develops, the virus can make you quite ill. CAUSES  Many different viruses can cause gastroenteritis, such as rotavirus or noroviruses. You can catch one of these viruses by consuming contaminated food or water. You may also catch a virus by sharing utensils or other personal items with an infected person or by touching a contaminated surface. SYMPTOMS  The most common symptoms are diarrhea and vomiting. These problems can cause a severe loss of body fluids (dehydration) and a body salt (electrolyte) imbalance. Other symptoms may include:  Fever.   Headache.   Fatigue.   Abdominal pain.  DIAGNOSIS  Your caregiver can usually diagnose viral gastroenteritis based on your symptoms and a physical exam. A stool sample may also be taken to test for the presence of viruses or other infections. TREATMENT  This illness typically goes away on its own. Treatments are aimed at rehydration. The most serious cases of viral gastroenteritis involve vomiting so severely that you are not able to keep fluids down. In these cases, fluids must be given through an intravenous line (IV). HOME CARE INSTRUCTIONS   Drink enough fluids to keep your urine clear or pale yellow. Drink small amounts of fluids frequently and increase the amounts as tolerated.   Ask your caregiver for specific rehydration instructions.   Avoid:   Foods high in sugar.   Alcohol.   Carbonated drinks.   Tobacco.   Juice.   Caffeine drinks.   Extremely hot or cold fluids.   Fatty, greasy foods.   Too much intake of anything at one time.   Dairy products until 24 to 48 hours after diarrhea stops.   You may  consume probiotics. Probiotics are active cultures of beneficial bacteria. They may lessen the amount and number of diarrheal stools in adults. Probiotics can be found in yogurt with active cultures and in supplements.   Wash your hands well to avoid spreading the virus.   Only take over-the-counter or prescription medicines for pain, discomfort, or fever as directed by your caregiver. Do not give aspirin to children. Antidiarrheal medicines are not recommended.   Ask your caregiver if you should continue to take your regular prescribed and over-the-counter medicines.   Keep all follow-up appointments as directed by your caregiver.  SEEK IMMEDIATE MEDICAL CARE IF:   You are unable to keep fluids down.   You do not urinate at least once every 6 to 8 hours.   You develop shortness of breath.   You notice blood in your stool or vomit. This may look like coffee grounds.   You have abdominal pain that increases or is concentrated in one small area (localized).   You have persistent vomiting or diarrhea.   You have a fever.   The patient is a child younger than 3 months, and he or she has a fever.   The patient is a child older than 3 months, and he or she has a fever and persistent symptoms.   The patient is a child older than 3 months, and he or she has a fever and symptoms suddenly get worse.   The patient is a baby, and he or she has no tears when crying.  MAKE SURE YOU:     Understand these instructions.   Will watch your condition.   Will get help right away if you are not doing well or get worse.  Document Released: 08/08/2005 Document Revised: 07/28/2011 Document Reviewed: 05/25/2011 [employer] Patient Information 2012 [employer]. 

## 2012-01-02 NOTE — Progress Notes (Signed)
Subjective:    Patient ID: Austin [name], male    DOB: [birth year], 77 y.o.   MRN: [id number]  HPI 77 year old African American male, patient of Dr. Raliegh Ip. Is in today with complaints of fever, chills, hiccups, and diarrhea x2 days. Patient reports eating sirloin steak, chicken and broccoli on Saturday that he believes may be the cause of the heel today. He's had one episode of nausea vomiting. He has no abdominal pain today. He previously had abdominal pain that was around a 4/10 that has subsided. He says 6-10 episodes of diarrhea in the last 24 hours. Denies any sick contacts. He has not taken any medication over-the-counter.   Review of Systems  Constitutional: Positive for fever.  HENT: Negative.   Respiratory: Negative.   Cardiovascular: Negative.   Gastrointestinal: Positive for nausea and diarrhea.  Genitourinary: Negative.  Negative for dysuria, frequency and hematuria.  Musculoskeletal: Negative.   Skin: Negative.   Neurological: Negative.   Hematological: Negative.   Psychiatric/Behavioral: Negative.    Past Medical History  Diagnosis Date  . 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 CAROTID ARTERY STENOSIS 05/10/2010  . DIABETES MELLITUS, TYPE II 09/19/2007  . DISEASE, CEREBROVASCULAR NEC 03/05/2007  . GERD 03/13/2007  . HYPERLIPIDEMIA 03/05/2007  . HYPERTENSION 03/05/2007  . HYPOKALEMIA 11/09/2009  . KNEE PAIN, RIGHT 11/09/2009  . PROSTATE CANCER, HX OF 03/05/2007  . RENAL DISEASE, CHRONIC 02/03/2009    History   Social History  . Marital Status: Married    Spouse Name: N/A    Number of Children: N/A  . Years of Education: N/A   Occupational History  . Not on file.   Social History Main Topics  . Smoking status: Former Smoker    Quit date: 08/22/1974  . Smokeless tobacco: Never Used  . Alcohol Use: No  . Drug Use: No  . Sexually Active: Not on file   Other Topics Concern  . Not on file   Social History Narrative  . No narrative on file    Past Surgical  History  Procedure Date  . Carotid artery angioplasty   . Prostate surgery     prostatectomy    No family history on file.  No Known Allergies  Current Outpatient Prescriptions on File Prior to Visit  Medication Sig Dispense Refill  . amlodipine-atorvastatin (CADUET) 10-20 MG per tablet Take 1 tablet by mouth daily.  90 tablet  3  . amlodipine-atorvastatin (CADUET) 10-20 MG per tablet TAKE 1 TABLET EVERY DAY  90 tablet  3  . aspirin 81 MG tablet Take 81 mg by mouth daily.        . clopidogrel (PLAVIX) 75 MG tablet Take 1 tablet (75 mg total) by mouth daily.  100 tablet  6  . esomeprazole (NEXIUM) 40 MG capsule Take 1 capsule (40 mg total) by mouth daily before breakfast.  90 capsule  6  . Ferrous Sulfate (FERATAB) 300 (60 FE) MG TABS Take 1 tablet by mouth daily.        [place] Kitchen glipiZIDE (GLUCOTROL XL) 5 MG 24 hr tablet Take 1 tablet (5 mg total) by mouth daily.  90 tablet  6  . glucose blood (FREESTYLE LITE) test strip Use as instructed  100 each  0  . hydrocortisone-pramoxine (ANALPRAM-HC) 2.5-1 % rectal cream USE AS DIRECTED  28.35 g  1  . losartan (COZAAR) 100 MG tablet Take 1 tablet (100 mg total) by mouth daily.  90 tablet  6  . potassium chloride SA (KLOR-CON  M20) 20 MEQ tablet Take 1 tablet (20 mEq total) by mouth daily.  100 tablet  6    BP 150/80  Temp(Src) 103 F (39.4 C) (Oral)  Wt 190 lb (86.183 kg)chart    Objective:   Physical Exam  Constitutional: He is oriented to person, place, and time. He appears well-developed and well-nourished.  Neck: Normal range of motion. Neck supple.  Cardiovascular: Normal rate, regular rhythm and normal heart sounds.   Pulmonary/Chest: Effort normal and breath sounds normal.  Abdominal: Soft. Bowel sounds are normal. He exhibits no distension. There is no tenderness. There is no rebound.  Musculoskeletal: Normal range of motion.  Neurological: He is alert and oriented to person, place, and time.  Skin: Skin is warm and dry.    Psychiatric: He has a normal mood and affect.          Assessment & Plan:  Assessment: Viral gastroenteritis, nausea, vomiting, diarrhea  Plan: Discussed case with Dr. Raliegh Ip., we'll send CBC and BMP notify patient pending results. It appears his symptoms are viral in nature however, we will be sure. Rest. Drink plenty of fluids. Patient quite often symptoms worsen or persist. Recheck a schedule, when necessary.

## 2012-01-03 LAB — BASIC METABOLIC PANEL
BUN: 21 mg/dL (ref 6–23)
Calcium: 9.1 mg/dL (ref 8.4–10.5)
GFR: 62.97 mL/min (ref >60.00)
Glucose, Bld: 166 mg/dL — ABNORMAL HIGH (ref 70–99)
Potassium: 2.9 mEq/L — ABNORMAL LOW (ref 3.5–5.1)
Sodium: 140 mEq/L (ref 135–145)

## 2012-01-09 ENCOUNTER — Encounter: Payer: Self-pay | Admitting: Internal Medicine

## 2012-01-09 ENCOUNTER — Ambulatory Visit (INDEPENDENT_AMBULATORY_CARE_PROVIDER_SITE_OTHER): Payer: Medicare Other | Admitting: Internal Medicine

## 2012-01-09 ENCOUNTER — Other Ambulatory Visit: Payer: Self-pay | Admitting: Internal Medicine

## 2012-01-09 VITALS — BP 132/88 | Temp 98.4°F | Wt 188.0 lb

## 2012-01-09 DIAGNOSIS — E119 Type 2 diabetes mellitus without complications: Secondary | ICD-10-CM

## 2012-01-09 DIAGNOSIS — N289 Disorder of kidney and ureter, unspecified: Secondary | ICD-10-CM

## 2012-01-09 DIAGNOSIS — E876 Hypokalemia: Secondary | ICD-10-CM

## 2012-01-09 DIAGNOSIS — I10 Essential (primary) hypertension: Secondary | ICD-10-CM

## 2012-01-09 NOTE — Patient Instructions (Signed)
Limit your sodium (Salt) intake   Please check your hemoglobin A1c every 3 months   

## 2012-01-09 NOTE — Progress Notes (Signed)
  Subjective:    Patient ID: Austin [name], male    DOB: Dec 28, 1934, 77 y.o.   MRN: [id number]  HPI  77 year old patient who is seen today for followup of his type 2 diabetes. He was seen one week ago with acute viral gastroenteritis and did have some hypokalemia. He has resumed potassium supplementation. His chronic kidney disease and treated hypertension. Today he feels well.    Review of Systems  Constitutional: Negative for fever, chills, appetite change and fatigue.  HENT: Negative for hearing loss, ear pain, congestion, sore throat, trouble swallowing, neck stiffness, dental problem, voice change and tinnitus.   Eyes: Negative for pain, discharge and visual disturbance.  Respiratory: Negative for cough, chest tightness, wheezing and stridor.   Cardiovascular: Negative for chest pain, palpitations and leg swelling.  Gastrointestinal: Negative for nausea, vomiting, abdominal pain, diarrhea, constipation, blood in stool and abdominal distention.  Genitourinary: Negative for urgency, hematuria, flank pain, discharge, difficulty urinating and genital sores.  Musculoskeletal: Negative for myalgias, back pain, joint swelling, arthralgias and gait problem.  Skin: Negative for rash.  Neurological: Negative for dizziness, syncope, speech difficulty, weakness, numbness and headaches.  Hematological: Negative for adenopathy. Does not bruise/bleed easily.  Psychiatric/Behavioral: Negative for behavioral problems and dysphoric mood. The patient is not nervous/anxious.        Objective:   Physical Exam  Constitutional: He is oriented to person, place, and time. He appears well-developed.  HENT:  Head: Normocephalic.  Right Ear: External ear normal.  Left Ear: External ear normal.  Eyes: Conjunctivae and EOM are normal.  Neck: Normal range of motion.  Cardiovascular: Normal rate and normal heart sounds.   Pulmonary/Chest: Breath sounds normal.  Abdominal: Bowel sounds are normal.    Musculoskeletal: Normal range of motion. He exhibits no edema and no tenderness.  Neurological: He is alert and oriented to person, place, and time.  Psychiatric: He has a normal mood and affect. His behavior is normal.          Assessment & Plan:   Diabetes mellitus. We'll check a hemoglobin A1c Hypertension stable Status post viral gastroenteritis with hypokalemia. The patient has resumed potassium and his GI symptoms have resolved Chronic kidney disease  Recheck 3 months

## 2012-01-30 ENCOUNTER — Other Ambulatory Visit: Payer: Self-pay | Admitting: Internal Medicine

## 2012-02-09 ENCOUNTER — Ambulatory Visit (INDEPENDENT_AMBULATORY_CARE_PROVIDER_SITE_OTHER): Payer: Medicare Other | Admitting: Internal Medicine

## 2012-02-09 ENCOUNTER — Encounter: Payer: Self-pay | Admitting: Internal Medicine

## 2012-02-09 VITALS — BP 138/76 | Temp 98.2°F | Wt 188.0 lb

## 2012-02-09 DIAGNOSIS — R197 Diarrhea, unspecified: Secondary | ICD-10-CM

## 2012-02-09 DIAGNOSIS — I10 Essential (primary) hypertension: Secondary | ICD-10-CM

## 2012-02-09 DIAGNOSIS — E119 Type 2 diabetes mellitus without complications: Secondary | ICD-10-CM

## 2012-02-09 DIAGNOSIS — N289 Disorder of kidney and ureter, unspecified: Secondary | ICD-10-CM

## 2012-02-09 MED ORDER — DIPHENOXYLATE-ATROPINE 2.5-0.025 MG PO TABS: 1.0000 | ORAL_TABLET | Freq: Four times a day (QID) | ORAL | Status: DC | PRN

## 2012-02-09 NOTE — Progress Notes (Signed)
  Subjective:    Patient ID: Austin [name], male    DOB: Oct 06, 1934, 77 y.o.   MRN: [id number]  HPI  77 year old patient  who has diabetes and hypertension. He presents with a four-day history of diarrhea. He's had this intermittently in the past. He's had 4 loose stools since she was awakened at 4 AM today. No vomiting. He is tolerating a full liquid diet without difficulty. He does have a history of type 2 diabetes. He feels that he may have eaten some spoiled chicken salad.    Review of Systems  Constitutional: Negative for fever, chills, appetite change and fatigue.  HENT: Negative for hearing loss, ear pain, congestion, sore throat, trouble swallowing, neck stiffness, dental problem, voice change and tinnitus.   Eyes: Negative for pain, discharge and visual disturbance.  Respiratory: Negative for cough, chest tightness, wheezing and stridor.   Cardiovascular: Negative for chest pain, palpitations and leg swelling.  Gastrointestinal: Positive for diarrhea. Negative for nausea, vomiting, abdominal pain, constipation, blood in stool and abdominal distention.  Genitourinary: Negative for urgency, hematuria, flank pain, discharge, difficulty urinating and genital sores.  Musculoskeletal: Negative for myalgias, back pain, joint swelling, arthralgias and gait problem.  Skin: Negative for rash.  Neurological: Negative for dizziness, syncope, speech difficulty, weakness, numbness and headaches.  Hematological: Negative for adenopathy. Does not bruise/bleed easily.  Psychiatric/Behavioral: Negative for behavioral problems and dysphoric mood. The patient is not nervous/anxious.        Objective:   Physical Exam  Constitutional: He appears well-developed and well-nourished. No distress.  Abdominal: Soft. Bowel sounds are normal. He exhibits no distension and no mass. There is no tenderness. There is no rebound and no guarding.          Assessment & Plan:    Diarrhea. Will treat  symptomatically and slowly advance diet. He'll call if unimproved Diabetes well-controlled Hypertension controlled

## 2012-02-09 NOTE — Patient Instructions (Addendum)
The main concern with gastroenteritis is dehydration.  Drink  plenty of  fluids, and advance your diet  slowly to solids as you feel improved.  If you are unable to keep anything down or  Show  signs of dehydration such as dry, cracked lips, or  not urinating, please notify our office.

## 2012-02-20 ENCOUNTER — Encounter: Payer: Self-pay | Admitting: Internal Medicine

## 2012-02-20 ENCOUNTER — Inpatient Hospital Stay (HOSPITAL_COMMUNITY)
Admission: EM | Admit: 2012-02-20 | Discharge: 2012-02-28 | DRG: 342 | Disposition: A | Discharge status: Home / Self Care | Payer: Medicare Other | Attending: Surgery | Admitting: Surgery

## 2012-02-20 ENCOUNTER — Encounter (HOSPITAL_COMMUNITY): Payer: Self-pay | Admitting: Anesthesiology

## 2012-02-20 ENCOUNTER — Emergency Department (HOSPITAL_COMMUNITY): Payer: Medicare Other

## 2012-02-20 ENCOUNTER — Encounter (HOSPITAL_COMMUNITY): Admission: EM | Disposition: A | Discharge status: Home / Self Care | Payer: Self-pay | Source: Home / Self Care

## 2012-02-20 ENCOUNTER — Ambulatory Visit (INDEPENDENT_AMBULATORY_CARE_PROVIDER_SITE_OTHER): Payer: Medicare Other | Admitting: Internal Medicine

## 2012-02-20 ENCOUNTER — Inpatient Hospital Stay (HOSPITAL_COMMUNITY): Payer: Medicare Other | Admitting: Anesthesiology

## 2012-02-20 ENCOUNTER — Encounter (HOSPITAL_COMMUNITY): Payer: Self-pay | Admitting: Emergency Medicine

## 2012-02-20 ENCOUNTER — Ambulatory Visit (INDEPENDENT_AMBULATORY_CARE_PROVIDER_SITE_OTHER)
Admission: RE | Admit: 2012-02-20 | Discharge: 2012-02-20 | Disposition: A | Discharge status: Home / Self Care | Payer: Medicare Other | Source: Ambulatory Visit | Attending: Internal Medicine | Admitting: Internal Medicine

## 2012-02-20 VITALS — BP 180/90 | Temp 99.6°F | Wt 186.0 lb

## 2012-02-20 DIAGNOSIS — K560 Paralytic ileus: Secondary | ICD-10-CM | POA: Diagnosis present

## 2012-02-20 DIAGNOSIS — I10 Essential (primary) hypertension: Secondary | ICD-10-CM | POA: Diagnosis present

## 2012-02-20 DIAGNOSIS — K352 Acute appendicitis with generalized peritonitis, without abscess: Secondary | ICD-10-CM | POA: Diagnosis present

## 2012-02-20 DIAGNOSIS — E785 Hyperlipidemia, unspecified: Secondary | ICD-10-CM | POA: Diagnosis present

## 2012-02-20 DIAGNOSIS — Z8546 Personal history of malignant neoplasm of prostate: Secondary | ICD-10-CM

## 2012-02-20 DIAGNOSIS — R1031 Right lower quadrant pain: Secondary | ICD-10-CM

## 2012-02-20 DIAGNOSIS — E119 Type 2 diabetes mellitus without complications: Secondary | ICD-10-CM | POA: Diagnosis present

## 2012-02-20 DIAGNOSIS — N289 Disorder of kidney and ureter, unspecified: Secondary | ICD-10-CM

## 2012-02-20 DIAGNOSIS — K219 Gastro-esophageal reflux disease without esophagitis: Secondary | ICD-10-CM | POA: Diagnosis present

## 2012-02-20 DIAGNOSIS — K3580 Unspecified acute appendicitis: Secondary | ICD-10-CM

## 2012-02-20 DIAGNOSIS — K35209 Acute appendicitis with generalized peritonitis, without abscess, unspecified as to perforation: Secondary | ICD-10-CM | POA: Diagnosis present

## 2012-02-20 DIAGNOSIS — D500 Iron deficiency anemia secondary to blood loss (chronic): Secondary | ICD-10-CM | POA: Diagnosis present

## 2012-02-20 HISTORY — PX: LAPAROSCOPIC APPENDECTOMY: SHX408

## 2012-02-20 LAB — COMPREHENSIVE METABOLIC PANEL
Albumin: 4.2 g/dL (ref 3.5–5.2)
BUN: 17 mg/dL (ref 6–23)
CO2: 28 mEq/L (ref 19–32)
Calcium: 9.6 mg/dL (ref 8.4–10.5)
Chloride: 100 mEq/L (ref 96–112)
Creatinine, Ser: 1.3 mg/dL (ref 0.4–1.5)
GFR: 67.92 mL/min (ref >60.00)
Glucose, Bld: 173 mg/dL — ABNORMAL HIGH (ref 70–99)
Potassium: 3.5 mEq/L (ref 3.5–5.1)

## 2012-02-20 LAB — TYPE AND SCREEN: Antibody Screen: NEGATIVE

## 2012-02-20 LAB — CBC WITH DIFFERENTIAL/PLATELET
Basophils Relative: 0 % (ref 0.0–3.0)
Eosinophils Absolute: 0 10*3/uL (ref 0.0–0.7)
Hemoglobin: 14.8 g/dL (ref 13.0–17.0)
Lymphocytes Relative: 2.8 % — ABNORMAL LOW (ref 12.0–46.0)
Monocytes Relative: 5.3 % (ref 3.0–12.0)
Neutro Abs: 18.3 10*3/uL — ABNORMAL HIGH (ref 1.4–7.7)
Neutrophils Relative %: 91.9 % — ABNORMAL HIGH (ref 43.0–77.0)
RBC: 5.38 Mil/uL (ref 4.22–5.81)
WBC: 19.9 10*3/uL (ref 4.5–10.5)

## 2012-02-20 LAB — GLUCOSE, CAPILLARY: Glucose-Capillary: 192 mg/dL — ABNORMAL HIGH (ref 70–99)

## 2012-02-20 SURGERY — APPENDECTOMY, LAPAROSCOPIC
Anesthesia: General | Site: Abdomen | Wound class: Dirty or Infected

## 2012-02-20 MED ORDER — INSULIN ASPART 100 UNIT/ML ~~LOC~~ SOLN
0.0000 [IU] | Freq: Three times a day (TID) | SUBCUTANEOUS | Status: DC
  Administered 2012-02-21 (×3): 3 [IU] via SUBCUTANEOUS
  Administered 2012-02-22 (×3): 2 [IU] via SUBCUTANEOUS
  Administered 2012-02-23 (×2): 3 [IU] via SUBCUTANEOUS
  Administered 2012-02-23: 2 [IU] via SUBCUTANEOUS
  Administered 2012-02-24: 5 [IU] via SUBCUTANEOUS
  Administered 2012-02-24: 2 [IU] via SUBCUTANEOUS
  Administered 2012-02-24: 3 [IU] via SUBCUTANEOUS
  Administered 2012-02-25: 5 [IU] via SUBCUTANEOUS
  Administered 2012-02-25: 2 [IU] via SUBCUTANEOUS
  Administered 2012-02-26: 3 [IU] via SUBCUTANEOUS
  Administered 2012-02-27: 2 [IU] via SUBCUTANEOUS
  Administered 2012-02-27: 3 [IU] via SUBCUTANEOUS
  Administered 2012-02-27: 2 [IU] via SUBCUTANEOUS
  Administered 2012-02-28: 3 [IU] via SUBCUTANEOUS

## 2012-02-20 MED ORDER — SODIUM CHLORIDE 0.9 % IV SOLN
1.0000 g | INTRAVENOUS | Status: AC
  Administered 2012-02-20: 1 g via INTRAVENOUS
  Filled 2012-02-20 (×2): qty 1

## 2012-02-20 MED ORDER — LACTATED RINGERS IV SOLN
INTRAVENOUS | Status: DC | PRN
  Administered 2012-02-20: 21:00:00 via INTRAVENOUS

## 2012-02-20 MED ORDER — BUPIVACAINE-EPINEPHRINE 0.25% -1:200000 IJ SOLN
INTRAMUSCULAR | Status: DC | PRN
  Administered 2012-02-20 (×2): 10 mL

## 2012-02-20 MED ORDER — IOHEXOL 300 MG/ML  SOLN
100.0000 mL | Freq: Once | INTRAMUSCULAR | Status: AC | PRN
  Administered 2012-02-20: 100 mL via INTRAVENOUS

## 2012-02-20 MED ORDER — GLYCOPYRROLATE 0.2 MG/ML IJ SOLN
INTRAMUSCULAR | Status: DC | PRN
  Administered 2012-02-20: 0.4 mg via INTRAVENOUS

## 2012-02-20 MED ORDER — NEOSTIGMINE METHYLSULFATE 1 MG/ML IJ SOLN
INTRAMUSCULAR | Status: DC | PRN
  Administered 2012-02-20: 3 mg via INTRAVENOUS

## 2012-02-20 MED ORDER — HYDROMORPHONE HCL PF 1 MG/ML IJ SOLN
0.2500 mg | INTRAMUSCULAR | Status: DC | PRN
  Administered 2012-02-20 – 2012-02-21 (×3): 0.5 mg via INTRAVENOUS

## 2012-02-20 MED ORDER — ONDANSETRON HCL 4 MG/2ML IJ SOLN
INTRAMUSCULAR | Status: DC | PRN
  Administered 2012-02-20: 4 mg via INTRAVENOUS

## 2012-02-20 MED ORDER — LABETALOL HCL 5 MG/ML IV SOLN
5.0000 mg | INTRAVENOUS | Status: DC | PRN
  Administered 2012-02-20: 5 mg via INTRAVENOUS

## 2012-02-20 MED ORDER — SODIUM CHLORIDE 0.9 % IV SOLN
INTRAVENOUS | Status: DC | PRN
  Administered 2012-02-20: 23:00:00 via INTRAVENOUS

## 2012-02-20 MED ORDER — ONDANSETRON HCL 4 MG/2ML IJ SOLN: 4.0000 mg | Freq: Once | INTRAMUSCULAR | Status: AC | PRN

## 2012-02-20 MED ORDER — LABETALOL HCL 5 MG/ML IV SOLN
INTRAVENOUS | Status: AC
  Filled 2012-02-20: qty 4

## 2012-02-20 MED ORDER — INSULIN ASPART 100 UNIT/ML ~~LOC~~ SOLN: 0.0000 [IU] | Freq: Every day | SUBCUTANEOUS | Status: DC

## 2012-02-20 MED ORDER — ROCURONIUM BROMIDE 100 MG/10ML IV SOLN
INTRAVENOUS | Status: DC | PRN
  Administered 2012-02-20: 40 mg via INTRAVENOUS

## 2012-02-20 MED ORDER — SUCCINYLCHOLINE CHLORIDE 20 MG/ML IJ SOLN
INTRAMUSCULAR | Status: DC | PRN
  Administered 2012-02-20: 140 mg via INTRAVENOUS

## 2012-02-20 MED ORDER — METOPROLOL TARTRATE 1 MG/ML IV SOLN
2.5000 mg | INTRAVENOUS | Status: DC | PRN
  Administered 2012-02-23: 2.5 mg via INTRAVENOUS
  Filled 2012-02-20: qty 5

## 2012-02-20 MED ORDER — FENTANYL CITRATE 0.05 MG/ML IJ SOLN
INTRAMUSCULAR | Status: DC | PRN
  Administered 2012-02-20: 100 ug via INTRAVENOUS
  Administered 2012-02-20: 150 ug via INTRAVENOUS

## 2012-02-20 MED ORDER — DROPERIDOL 2.5 MG/ML IJ SOLN
INTRAMUSCULAR | Status: DC | PRN
  Administered 2012-02-20: 0.625 mg via INTRAVENOUS

## 2012-02-20 MED ORDER — LIDOCAINE HCL 1 % IJ SOLN
INTRAMUSCULAR | Status: DC | PRN
  Administered 2012-02-20: 10 mL

## 2012-02-20 MED ORDER — INSULIN ASPART 100 UNIT/ML ~~LOC~~ SOLN
4.0000 [IU] | Freq: Three times a day (TID) | SUBCUTANEOUS | Status: DC
  Administered 2012-02-24: 4 [IU] via SUBCUTANEOUS

## 2012-02-20 MED ORDER — PROPOFOL 10 MG/ML IV EMUL
INTRAVENOUS | Status: DC | PRN
  Administered 2012-02-20: 30 mg via INTRAVENOUS
  Administered 2012-02-20: 20 mg via INTRAVENOUS
  Administered 2012-02-20: 150 mg via INTRAVENOUS

## 2012-02-20 MED ORDER — LIDOCAINE HCL (CARDIAC) 20 MG/ML IV SOLN
INTRAVENOUS | Status: DC | PRN
  Administered 2012-02-20: 20 mg via INTRAVENOUS
  Administered 2012-02-20: 100 mg via INTRAVENOUS

## 2012-02-20 MED ORDER — INSULIN ASPART 100 UNIT/ML ~~LOC~~ SOLN: 0.0000 [IU] | Freq: Three times a day (TID) | SUBCUTANEOUS | Status: DC

## 2012-02-20 MED ORDER — SODIUM CHLORIDE 0.9 % IV SOLN
Freq: Once | INTRAVENOUS | Status: AC
  Administered 2012-02-20: 17:00:00 via INTRAVENOUS

## 2012-02-20 MED ORDER — SODIUM CHLORIDE 0.9 % IR SOLN
Status: DC | PRN
  Administered 2012-02-20 (×2): 1000 mL

## 2012-02-20 SURGICAL SUPPLY — 52 items
ADH SKN CLS APL DERMABOND .7 (GAUZE/BANDAGES/DRESSINGS) ×1
APPLIER CLIP ROT 10 11.4 M/L (STAPLE)
APR CLP MED LRG 11.4X10 (STAPLE)
BAG SPEC RTRVL LRG 6X4 10 (ENDOMECHANICALS) ×1
BLADE SURG ROTATE 9660 (MISCELLANEOUS) IMPLANT
CANISTER SUCTION 2500CC (MISCELLANEOUS) ×2 IMPLANT
CHLORAPREP W/TINT 26ML (MISCELLANEOUS) ×2 IMPLANT
CLIP APPLIE ROT 10 11.4 M/L (STAPLE) IMPLANT
CLOTH BEACON ORANGE TIMEOUT ST (SAFETY) ×2 IMPLANT
COVER SURGICAL LIGHT HANDLE (MISCELLANEOUS) ×2 IMPLANT
CUTTER FLEX LINEAR 45M (STAPLE) ×2 IMPLANT
DERMABOND ADVANCED (GAUZE/BANDAGES/DRESSINGS) ×1
DERMABOND ADVANCED .7 DNX12 (GAUZE/BANDAGES/DRESSINGS) ×1 IMPLANT
DRAIN CHANNEL 19F RND (DRAIN) ×1 IMPLANT
DRAPE UTILITY 15X26 W/TAPE STR (DRAPE) ×4 IMPLANT
DRAPE WARM FLUID 44X44 (DRAPE) ×2 IMPLANT
ELECT REM PT RETURN 9FT ADLT (ELECTROSURGICAL) ×2
ELECTRODE REM PT RTRN 9FT ADLT (ELECTROSURGICAL) ×1 IMPLANT
ENDOLOOP SUT PDS II  0 18 (SUTURE)
ENDOLOOP SUT PDS II 0 18 (SUTURE) IMPLANT
EVACUATOR SILICONE 100CC (DRAIN) ×1 IMPLANT
GAUZE SPONGE 2X2 8PLY STRL LF (GAUZE/BANDAGES/DRESSINGS) IMPLANT
GLOVE BIO SURGEON STRL SZ 6 (GLOVE) ×2 IMPLANT
GLOVE BIOGEL PI IND STRL 6.5 (GLOVE) ×1 IMPLANT
GLOVE BIOGEL PI IND STRL 7.0 (GLOVE) IMPLANT
GLOVE BIOGEL PI INDICATOR 6.5 (GLOVE) ×1
GLOVE BIOGEL PI INDICATOR 7.0 (GLOVE) ×1
GLOVE ECLIPSE 6.5 STRL STRAW (GLOVE) ×1 IMPLANT
GOWN PREVENTION PLUS XXLARGE (GOWN DISPOSABLE) ×4 IMPLANT
GOWN STRL NON-REIN LRG LVL3 (GOWN DISPOSABLE) ×4 IMPLANT
KIT BASIN OR (CUSTOM PROCEDURE TRAY) ×2 IMPLANT
KIT ROOM TURNOVER OR (KITS) ×2 IMPLANT
NS IRRIG 1000ML POUR BTL (IV SOLUTION) ×2 IMPLANT
PAD ARMBOARD 7.5X6 YLW CONV (MISCELLANEOUS) ×4 IMPLANT
POUCH SPECIMEN RETRIEVAL 10MM (ENDOMECHANICALS) ×2 IMPLANT
RELOAD STAPLE 45 3.5 BLU ETS (ENDOMECHANICALS) ×1 IMPLANT
RELOAD STAPLE TA45 3.5 REG BLU (ENDOMECHANICALS) ×2 IMPLANT
SCALPEL HARMONIC ACE (MISCELLANEOUS) ×2 IMPLANT
SET IRRIG TUBING LAPAROSCOPIC (IRRIGATION / IRRIGATOR) ×2 IMPLANT
SLEEVE ENDOPATH XCEL 5M (ENDOMECHANICALS) ×3 IMPLANT
SPECIMEN JAR SMALL (MISCELLANEOUS) ×2 IMPLANT
SPONGE GAUZE 2X2 STER 10/PKG (GAUZE/BANDAGES/DRESSINGS) ×1
SUT ETHILON 2 0 FS 18 (SUTURE) ×1 IMPLANT
SUT MNCRL AB 4-0 PS2 18 (SUTURE) ×2 IMPLANT
TAPE CLOTH SOFT 2X10 (GAUZE/BANDAGES/DRESSINGS) ×1 IMPLANT
TOWEL OR 17X24 6PK STRL BLUE (TOWEL DISPOSABLE) ×2 IMPLANT
TOWEL OR 17X26 10 PK STRL BLUE (TOWEL DISPOSABLE) ×2 IMPLANT
TRAY FOLEY CATH 14FR (SET/KITS/TRAYS/PACK) ×2 IMPLANT
TRAY LAPAROSCOPIC (CUSTOM PROCEDURE TRAY) ×2 IMPLANT
TROCAR XCEL BLUNT TIP 100MML (ENDOMECHANICALS) ×2 IMPLANT
TROCAR XCEL NON-BLD 5MMX100MML (ENDOMECHANICALS) ×2 IMPLANT
WATER STERILE IRR 1000ML POUR (IV SOLUTION) IMPLANT

## 2012-02-20 NOTE — ED Notes (Signed)
Onset 2 days ago RLQ abdominal pain continued today. Had outpatient CT rule out appendicitis.  Sent to ED for evaluation and surgery consult.

## 2012-02-20 NOTE — ED Provider Notes (Signed)
History     CSN: [id number]  Arrival date & time 02/20/12  1613   First MD Initiated Contact with Patient 02/20/12 1645      Chief Complaint  Patient presents with  . Abdominal Pain    (Consider location/radiation/quality/duration/timing/severity/associated sxs/prior treatment) Patient is a 77 y.o. male presenting with abdominal pain. The history is provided by the patient.  Abdominal Pain The primary symptoms of the illness include abdominal pain and fever. The primary symptoms of the illness do not include nausea, vomiting, diarrhea or dysuria. The current episode started 2 days ago. The onset of the illness was gradual. The problem has been gradually worsening.  Additional symptoms associated with the illness include chills and anorexia.  Pt seen today by his PCP, had CT scan done of the abdomen which showed acute appendicitis with possible perforation. Pt states pain ins currently tradable. Denies N/V. Was told to come straight here.   Past Medical History  Diagnosis Date  . 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 CAROTID ARTERY STENOSIS 05/10/2010  . DIABETES MELLITUS, TYPE II 09/19/2007  . DISEASE, CEREBROVASCULAR NEC 03/05/2007  . GERD 03/13/2007  . HYPERLIPIDEMIA 03/05/2007  . HYPERTENSION 03/05/2007  . HYPOKALEMIA 11/09/2009  . KNEE PAIN, RIGHT 11/09/2009  . PROSTATE CANCER, HX OF 03/05/2007  . RENAL DISEASE, CHRONIC 02/03/2009    Past Surgical History  Procedure Date  . Carotid artery angioplasty   . Prostate surgery     prostatectomy    No family history on file.  History  Substance Use Topics  . Smoking status: Former Smoker    Quit date: 08/22/1974  . Smokeless tobacco: Never Used  . Alcohol Use: No      Review of Systems  Constitutional: Positive for fever and chills.  HENT: Negative for neck pain and neck stiffness.   Respiratory: Negative.   Cardiovascular: Negative.   Gastrointestinal: Positive for abdominal pain and anorexia. Negative for  nausea, vomiting and diarrhea.  Genitourinary: Negative for dysuria, flank pain and testicular pain.  Musculoskeletal: Negative.   Skin: Negative.   Neurological: Negative for dizziness and weakness.    Allergies  Aspirin  Home Medications   Current Outpatient Rx  Name Route Sig Dispense Refill  . AMLODIPINE-ATORVASTATIN 10-20 MG PO TABS Oral Take 1 tablet by mouth daily. 90 tablet 3  . ASPIRIN 81 MG PO TABS Oral Take 81 mg by mouth daily.      Marland Kitchen CLOPIDOGREL BISULFATE 75 MG PO TABS Oral Take 1 tablet (75 mg total) by mouth daily. 100 tablet 6  . DIPHENOXYLATE-ATROPINE 2.5-0.025 MG PO TABS Oral Take 1 tablet by mouth 4 (four) times daily as needed for diarrhea or loose stools. 30 tablet 1  . ESOMEPRAZOLE MAGNESIUM 40 MG PO CPDR Oral Take 1 capsule (40 mg total) by mouth daily before breakfast. 90 capsule 6  . FERROUS SULFATE 300 (60 FE) MG PO TABS Oral Take 1 tablet by mouth daily.      Marland Kitchen GLIPIZIDE ER 5 MG PO TB24 Oral Take 1 tablet (5 mg total) by mouth daily. 90 tablet 6  . GLUCOSE BLOOD VI STRP Other 1 each by Other route daily as needed for other. 100 each 4  . HYDROCORTISONE ACE-PRAMOXINE 2.5-1 % RE CREA  USE AS DIRECTED 28.35 g 1  . LOSARTAN POTASSIUM 100 MG PO TABS Oral Take 1 tablet (100 mg total) by mouth daily. 90 tablet 6  . POTASSIUM CHLORIDE CRYS ER 20 MEQ PO TBCR Oral Take 1  tablet (20 mEq total) by mouth daily. 100 tablet 6    BP 179/85  Pulse 108  Temp 103 F (39.4 C) (Oral)  Resp 18  SpO2 96%  Physical Exam  Nursing note and vitals reviewed. Constitutional: He is oriented to person, place, and time. He appears well-developed and well-nourished.       Uncomfortable appearing  HENT:  Head: Normocephalic.  Eyes: Conjunctivae are normal.  Cardiovascular: Regular rhythm and normal heart sounds.        tachycardic  Pulmonary/Chest: Effort normal and breath sounds normal. No respiratory distress. He has no wheezes. He has no rales.  Abdominal:       Abdomen soft,  tender in the right lower quadrant, guarding, rebound tenderness, bowels hypoactive  Neurological: He is alert and oriented to person, place, and time.  Skin: Skin is warm and dry.  Psychiatric: He has a normal mood and affect.    ED Course  Procedures (including critical care time)  Ct Abdomen Pelvis W Contrast  02/20/2012  *RADIOLOGY REPORT*  Clinical Data: Right lower quadrant abdominal pain.  CT ABDOMEN AND PELVIS WITH CONTRAST  Technique:  Multidetector CT imaging of the abdomen and pelvis was performed following the standard protocol during bolus administration of intravenous contrast.  Contrast: 171mL OMNIPAQUE IOHEXOL 300 MG/ML  SOLN  Comparison: None  Findings: The lung bases are clear.  The liver is unremarkable.  No focal lesions or biliary dilatation. The gallbladder is normal.  No common bile duct dilatation.  The pancreas is normal.  The spleen is normal in size.  No focal lesions.  There is a small right adrenal gland lesion which is most consistent with a benign adenoma.  The left adrenal gland is normal.  Both kidneys are normal except for A right renal cyst.  The stomach, duodenum, small bowel and colon are unremarkable.  The mid distal appendix is inflamed with mucosal enhancement and a mid appendiceal appendicolith.  Significant surrounding inflammation and fluid is noted.  Microperforation is possible.  No discrete abscess.  The bladder is unremarkable.  There are surgical changes from a prostatectomy.  The seminal vesicles are unremarkable.  No pelvic mass or pelvic lymphadenopathy.  No inguinal mass or hernia.  The bony structures are intact.  IMPRESSION:  1.  Findings consistent with acute mid distal appendicitis with possible perforation.  A mid appendiceal appendicolith is noted. No discrete abscess. 2.  No other significant abdominal/pelvic findings.  Original Report Authenticated By: P. Kalman Jewels, M.D.   5:01 PM Pt seen and examined. Outpatient CT showed acute appy with  possible perforation. Pt in NAD, abdomen tender, guarding, pt does not want pain medications at present. He is febrile, tachycardic, hypertensive. Pre op CXR, ECG ordered. Spoke with Dr. Alvino Chapel, who will call surgery. Antibiotics per their choice.    5:44 PM Dr. Alvino Chapel spoke with surgery, asked for platelets to be ready since pt is on plavix. Asked for cardiology clearance. Spoke with Dr. Terrence Dupont, will come and consult.    1. Acute appendicitis       MDM          Renold Genta, PA 02/21/12 [phone]

## 2012-02-20 NOTE — Anesthesia Preprocedure Evaluation (Addendum)
Anesthesia Evaluation  Patient identified by MRN, date of birth, ID band Patient awake    Reviewed: Allergy & Precautions, H&P , NPO status , Patient's Chart, lab work & pertinent test results  Airway Mallampati: II TM Distance: >3 FB Neck ROM: Full    Dental  (+) Dental Advisory Given Lower partials. Removed and given to wife:   Pulmonary  breath sounds clear to auscultation        Cardiovascular hypertension, Pt. on medications Rhythm:Regular Rate:Normal     Neuro/Psych TIA history; History of angioplasty of R carotid artery.    GI/Hepatic   Endo/Other  Diabetes mellitus-, Type 2, Oral Hypoglycemic Agents  Renal/GU      Musculoskeletal   Abdominal   Peds  Hematology   Anesthesia Other Findings   Reproductive/Obstetrics                           Anesthesia Physical Anesthesia Plan  ASA: III  Anesthesia Plan: General   Post-op Pain Management:    Induction: Intravenous, Rapid sequence and Cricoid pressure planned  Airway Management Planned: Oral ETT  Additional Equipment:   Intra-op Plan:   Post-operative Plan: Extubation in OR  Informed Consent: I have reviewed the patients History and Physical, chart, labs and discussed the procedure including the risks, benefits and alternatives for the proposed anesthesia with the patient or authorized representative who has indicated his/her understanding and acceptance.   Dental advisory given  Plan Discussed with: Surgeon and CRNA  Anesthesia Plan Comments:        Anesthesia Quick Evaluation

## 2012-02-20 NOTE — Patient Instructions (Signed)
CT abdominal scan as discussed

## 2012-02-20 NOTE — H&P (Signed)
Austin [name] is an 77 y.o. male.   Chief Complaint: Abdominal pain. HPI:  Pt presents with 2 days of worsening RLQ abdominal pain.  He has had mild nausea, but no vomiting.  He had a fever with chills today, but not yesterday.  He has not had an appetite for the duration either.  He has diarrhea on and off for several months, but this has not been a problem in the last 24 hours.  He is on plavix for cerebrovascular disease.  He did not take that today or yesterday.  He denies chest pain or shortness of breath.  He is not feeling palpitations, but has been having them on the monitor.    Past Medical History  Diagnosis Date  . 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 CAROTID ARTERY STENOSIS 05/10/2010  . DIABETES MELLITUS, TYPE II 09/19/2007  . DISEASE, CEREBROVASCULAR NEC 03/05/2007  . GERD 03/13/2007  . HYPERLIPIDEMIA 03/05/2007  . HYPERTENSION 03/05/2007  . HYPOKALEMIA 11/09/2009  . KNEE PAIN, RIGHT 11/09/2009  . PROSTATE CANCER, HX OF 03/05/2007  . RENAL DISEASE, CHRONIC 02/03/2009    Past Surgical History  Procedure Date  . Carotid artery angioplasty   . Prostate surgery     prostatectomy    No family history on file. Social History:  reports that he quit smoking about 37 years ago. He has never used smokeless tobacco. He reports that he does not drink alcohol or use illicit drugs.  Allergies:  Allergies  Allergen Reactions  . Aspirin Other (See Comments)    High doses causes stomach ulcer and bleeding     (Not in a hospital admission)  Results for orders placed during the hospital encounter of 02/20/12 (from the past 48 hour(s))  TYPE AND SCREEN     Status: Normal (Preliminary result)   Collection Time   02/20/12  5:42 PM      Component Value Range Comment   ABO/RH(D) O POS      Antibody Screen PENDING      Sample Expiration 02/23/2012      Ct Abdomen Pelvis W Contrast  02/20/2012  *RADIOLOGY REPORT*  Clinical Data: Right lower quadrant abdominal pain.  CT ABDOMEN AND  PELVIS WITH CONTRAST  Technique:  Multidetector CT imaging of the abdomen and pelvis was performed following the standard protocol during bolus administration of intravenous contrast.  Contrast: 160mL OMNIPAQUE IOHEXOL 300 MG/ML  SOLN  Comparison: None  Findings: The lung bases are clear.  The liver is unremarkable.  No focal lesions or biliary dilatation. The gallbladder is normal.  No common bile duct dilatation.  The pancreas is normal.  The spleen is normal in size.  No focal lesions.  There is a small right adrenal gland lesion which is most consistent with a benign adenoma.  The left adrenal gland is normal.  Both kidneys are normal except for A right renal cyst.  The stomach, duodenum, small bowel and colon are unremarkable.  The mid distal appendix is inflamed with mucosal enhancement and a mid appendiceal appendicolith.  Significant surrounding inflammation and fluid is noted.  Microperforation is possible.  No discrete abscess.  The bladder is unremarkable.  There are surgical changes from a prostatectomy.  The seminal vesicles are unremarkable.  No pelvic mass or pelvic lymphadenopathy.  No inguinal mass or hernia.  The bony structures are intact.  IMPRESSION:  1.  Findings consistent with acute mid distal appendicitis with possible perforation.  A mid appendiceal appendicolith is noted. No  discrete abscess. 2.  No other significant abdominal/pelvic findings.  Original Report Authenticated By: P. Kalman Jewels, M.D.   Dg Chest Portable 1 View  02/20/2012  *RADIOLOGY REPORT*  Clinical Data: Preoperative respiratory exam. Hypertension. Appendicitis.  PORTABLE CHEST - 1 VIEW  Comparison: 06/14/2007  Findings: The heart size and pulmonary vascularity are normal and the lungs are clear.  No significant osseous abnormality.  IMPRESSION: Normal chest.  Original Report Authenticated By: Larey Seat, M.D.    Review of Systems  Constitutional: Positive for fever and chills. Negative for weight loss,  malaise/fatigue and diaphoresis.  HENT: Negative.   Eyes: Negative.   Respiratory: Negative.   Cardiovascular: Positive for palpitations.  Gastrointestinal: Positive for nausea, vomiting, abdominal pain and diarrhea.  Genitourinary: Negative.   Musculoskeletal: Negative.   Skin: Negative.   Neurological: Negative.  Negative for weakness.  Endo/Heme/Allergies:       On plavix  Psychiatric/Behavioral: Negative.     Blood pressure 179/85, pulse 108, temperature 103 F (39.4 C), temperature source Oral, resp. rate 18, SpO2 96.00%. Physical Exam  Constitutional: He is oriented to person, place, and time. He appears well-developed and well-nourished. No distress.  HENT:  Head: Normocephalic and atraumatic.  Eyes: Conjunctivae are normal. Pupils are equal, round, and reactive to light. No scleral icterus.  Neck: Normal range of motion. Neck supple. No tracheal deviation present. No thyromegaly present.  Cardiovascular: Regular rhythm and intact distal pulses.   Respiratory: Effort normal and breath sounds normal. No respiratory distress. He exhibits no tenderness.  GI: Soft. Bowel sounds are normal. He exhibits distension (mildly). He exhibits no mass. There is tenderness. There is rebound and guarding.  Musculoskeletal: He exhibits no edema and no tenderness.  Lymphadenopathy:    He has no cervical adenopathy.  Neurological: He is alert and oriented to person, place, and time.  Skin: Skin is warm and dry. No rash noted. He is not diaphoretic. No erythema. No pallor.  Psychiatric: He has a normal mood and affect. His behavior is normal. Judgment and thought content normal.     Assessment/Plan Acute appendicitis. IV antibiotics. Probable rupture Will plan IVF Lap appy. Will likely leave blake drain.  May have ileus and may require up to 1 week of IV antibiotics depending on operative findings. This was discussed with the pt.  Appendectomy was described to the patient.  The  incisions and surgical technique were explained.  The patient was advised that some of the hair on the abdomen would be clipped, and that a foley catheter would be placed.  I advised the patient of the risks of surgery including, but not limited to, bleeding, infection, damage to other structures, risk of an open operation, risk of abscess, and risk of blood clot.  The recovery was also described to the patient.  He was advised that he will have lifting restrictions for 2 weeks.    The patient will be given platelets for surgery given the plavix that he is on.   Dr. Terrence Dupont has seen the patient and cleared him for surgery.  He requests that the pt be on telemetry post operatively.    Lumi Winslett 02/20/2012, 7:10 PM

## 2012-02-20 NOTE — CV Procedure (Signed)
Appendectomy, Lap, Procedure Note  Indications: The patient presented with a history of right-sided abdominal pain. A CT revealed findings consistent with acute appendicitis with rupture  Pre-operative Diagnosis: Acute perforated appendicitis with peritonitis  Post-operative Diagnosis: Same  Surgeon: Stark Klein   Anesthesia: General endotracheal anesthesia  ASA Class: 3E  Procedure Details  The patient was seen again in the Holding Room. The risks, benefits, complications, treatment options, and expected outcomes were discussed with the patient and/or family. The possibilities of perforation of viscus, bleeding, recurrent infection, the need for additional procedures, failure to diagnose a condition, and creating a complication requiring transfusion or operation were discussed. There was concurrence with the proposed plan and informed consent was obtained. The site of surgery was properly noted. The patient was taken to Operating Room, identified as Austin [name] and the procedure verified as Appendectomy. A Time Out was held and the above information confirmed.  The patient was placed in the supine position and general anesthesia was induced, along with placement of orogastric tube, Venodyne boots, and a Foley catheter. The abdomen was prepped and draped in a sterile fashion. The patient was placed into reverse trendelenburg position and rotated to the right.  Local anesthetic was infiltrated into the left costal margin.  A 5 mm incision was made with the #11 blade.  The 5 mm optiview port was used to access the abdominal cavity.  The pneumoperitoneum was then established to steady pressure of 15 mmHg.  Additional 5 mm cannulas then placed in the left lower quadrant of the abdomen and the suprapubic region under direct visualization. The left subcostal incision was upsized to a 12 mm port.  A careful evaluation of the entire abdomen was carried out. The patient was placed in Trendelenburg and  rotated to the left.  The small intestines were retracted in the cephalad and left lateral direction away from the pelvis and right lower quadrant. The patient was found to have an enlarged and inflamed appendix in the retrocecal location. There was evidence of perforation.  The appendix was carefully dissected.  It was very adherent to the cecum and the lateral abdominal wall. The appendix was was skeletonized with the harmonic scalpel.   The appendix was divided at its base using an endo-GIA stapler. Minimal appendiceal stump was left in place. The appendix was removed from the abdomen with an Endocatch bag through the left subcostal port.  There was no evidence of bleeding, leakage, or complication after division of the appendix. Irrigation was also performed and irrigate suctioned from the abdomen as well.  A 19 Fr Blake drain was left in the pelvis.  The 5 mm trocars were removed.  The pneumoperitoneum was evacuated from the abdomen.    The trocar site skin wounds were closed with 4-0 Monocryl and dressed with Dermabond.  Instrument, sponge, and needle counts were correct at the conclusion of the case.   Findings: The appendix was found to be inflamed. There were signs of necrosis.  There was perforation. There was abscess formation.  The appendix was retrocecal in location.    Estimated Blood Loss:  less than 50 mL         Drains: 19 Fr Blake          Specimens: appendix         Complications:  None; patient tolerated the procedure well.         Disposition: PACU - hemodynamically stable.         Condition: stable

## 2012-02-20 NOTE — Progress Notes (Signed)
Subjective:    Patient ID: Austin [name], male    DOB: 10/09/1934, 77 y.o.   MRN: [id number]  HPI  77 year old patient who has multiple medical problems including type 2 diabetes hypertension and cerebrovascular disease medical regimen includes Plavix. He presents with a two-day history of worsening right lower quadrant pain. No nausea vomiting fever or chills. Diabetes has been stable. He has hypertension and mild chronic kidney disease which has also been stable. He Past Medical History  Diagnosis Date  . 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 CAROTID ARTERY STENOSIS 05/10/2010  . DIABETES MELLITUS, TYPE II 09/19/2007  . DISEASE, CEREBROVASCULAR NEC 03/05/2007  . GERD 03/13/2007  . HYPERLIPIDEMIA 03/05/2007  . HYPERTENSION 03/05/2007  . HYPOKALEMIA 11/09/2009  . KNEE PAIN, RIGHT 11/09/2009  . PROSTATE CANCER, HX OF 03/05/2007  . RENAL DISEASE, CHRONIC 02/03/2009    History   Social History  . Marital Status: Married    Spouse Name: N/A    Number of Children: N/A  . Years of Education: N/A   Occupational History  . Not on file.   Social History Main Topics  . Smoking status: Former Smoker    Quit date: 08/22/1974  . Smokeless tobacco: Never Used  . Alcohol Use: No  . Drug Use: No  . Sexually Active: Not on file   Other Topics Concern  . Not on file   Social History Narrative  . No narrative on file    Past Surgical History  Procedure Date  . Carotid artery angioplasty   . Prostate surgery     prostatectomy    No family history on file.  No Known Allergies  Current Outpatient Prescriptions on File Prior to Visit  Medication Sig Dispense Refill  . amlodipine-atorvastatin (CADUET) 10-20 MG per tablet Take 1 tablet by mouth daily.  90 tablet  3  . aspirin 81 MG tablet Take 81 mg by mouth daily.        . clopidogrel (PLAVIX) 75 MG tablet Take 1 tablet (75 mg total) by mouth daily.  100 tablet  6  . diphenoxylate-atropine (LOMOTIL) 2.5-0.025 MG per tablet Take 1  tablet by mouth 4 (four) times daily as needed for diarrhea or loose stools.  30 tablet  1  . esomeprazole (NEXIUM) 40 MG capsule Take 1 capsule (40 mg total) by mouth daily before breakfast.  90 capsule  6  . Ferrous Sulfate (FERATAB) 300 (60 FE) MG TABS Take 1 tablet by mouth daily.        Marland Kitchen glipiZIDE (GLUCOTROL XL) 5 MG 24 hr tablet Take 1 tablet (5 mg total) by mouth daily.  90 tablet  6  . glucose blood (FREESTYLE LITE) test strip 1 each by Other route daily as needed for other.  100 each  4  . hydrocortisone-pramoxine (ANALPRAM-HC) 2.5-1 % rectal cream USE AS DIRECTED  28.35 g  1  . losartan (COZAAR) 100 MG tablet Take 1 tablet (100 mg total) by mouth daily.  90 tablet  6  . potassium chloride SA (KLOR-CON M20) 20 MEQ tablet Take 1 tablet (20 mEq total) by mouth daily.  100 tablet  6   No current facility-administered medications on file prior to visit.    BP 180/90  Temp 99.6 F (37.6 C) (Oral)  Wt 186 lb (84.369 kg)       Review of Systems  Constitutional: Positive for activity change, appetite change and fatigue.  Gastrointestinal: Positive for abdominal pain.  Objective:   Physical Exam  Constitutional: He is oriented to person, place, and time. He appears well-developed.       Temperature 99.6 Blood pressure 160/84  HENT:  Head: Normocephalic.  Right Ear: External ear normal.  Left Ear: External ear normal.  Eyes: Conjunctivae and EOM are normal.  Neck: Normal range of motion.  Cardiovascular: Normal rate and normal heart sounds.   Pulmonary/Chest: Effort normal and breath sounds normal.  Abdominal: Soft. Bowel sounds are normal. He exhibits no distension. There is tenderness. There is rebound and guarding.       Patient has active bowel sounds No distention Patient had mild  guarding and rebound tenderness in the right lower quadrant No rigidity  Musculoskeletal: Normal range of motion. He exhibits no edema and no tenderness.  Neurological: He is alert  and oriented to person, place, and time.  Psychiatric: He has a normal mood and affect. His behavior is normal.          Assessment & Plan:  Fever/right lower quadrant pain. Probable acute appendicitis rule out rupture  The patient is sent for a urgent CT abdominal scan. Will likely need hospitalization and surgery Hypertension Diabetes mellitus Cerebral vascular disease/ chronic Plavix use

## 2012-02-20 NOTE — Preoperative (Signed)
Beta Blockers   Reason not to administer Beta Blockers:Not Applicable 

## 2012-02-20 NOTE — Transfer of Care (Signed)
Immediate Anesthesia Transfer of Care Note  Patient: Austin [name]  Procedure(s) Performed: Procedure(s) (LRB): APPENDECTOMY LAPAROSCOPIC (N/A)  Patient Location: PACU  Anesthesia Type: General  Level of Consciousness: awake, sedated, patient cooperative and responds to stimulation  Airway & Oxygen Therapy: Patient Spontanous Breathing, Patient connected to nasal cannula oxygen and Patient connected to face mask oxygen  Post-op Assessment: Report given to PACU RN, Post -op Vital signs reviewed and stable and Patient moving all extremities  Post vital signs: Reviewed and stable  Complications: No apparent anesthesia complications

## 2012-02-20 NOTE — Anesthesia Postprocedure Evaluation (Signed)
Anesthesia Post Note  Patient: Austin [name]  Procedure(s) Performed: Procedure(s) (LRB): APPENDECTOMY LAPAROSCOPIC (N/A)  Anesthesia type: general  Patient location: PACU  Post pain: Pain level controlled  Post assessment: Patient's Cardiovascular Status Stable  Last Vitals:  Filed Vitals:   02/20/12 2332  BP:   Pulse:   Temp: 36.7 C  Resp:     Post vital signs: Reviewed and stable  Level of consciousness: sedated  Complications: No apparent anesthesia complications

## 2012-02-20 NOTE — Consult Note (Signed)
Reason for Consult: Preop surgical clearance for laparotomy for acute appendicitis Referring Physician: [facility] surgery  Austin [name] is an 77 y.o. male.  HPI: Patient is 77 year old male with past medical history significant for hypertension non-insulin-dependent diabetes matters hypercholesteremia cerebrovascular disease status post PTA to right intracranial internal carotid artery her approximately 10+ years ago history of TIA in the past remote peptic ulcer disease, GERD, degenerative joint disease, history of  Ca  prostate status post TURP came to ER complaining of right-sided lower abdominal pain associated with nausea and diarrhea and low-grade fever for last few days and was noted to have acute appendicitis with questionable perforation and was noted to have markedly elevated WBCs. Patient denies any chest pain nausea or vomiting diaphoresis. Denies palpitation lightheadedness or syncope. Denies history of PND orthopnea leg swelling. Denies any weakness or slurred speech. Denies any blurring of vision. Patient denies any recent cardiac workup but had stress test approximately 89 years ago which showed no evidence of ischemia with normal LV function.  Past Medical History  Diagnosis Date  . 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 CAROTID ARTERY STENOSIS 05/10/2010  . DIABETES MELLITUS, TYPE II 09/19/2007  . DISEASE, CEREBROVASCULAR NEC 03/05/2007  . GERD 03/13/2007  . HYPERLIPIDEMIA 03/05/2007  . HYPERTENSION 03/05/2007  . HYPOKALEMIA 11/09/2009  . KNEE PAIN, RIGHT 11/09/2009  . PROSTATE CANCER, HX OF 03/05/2007  . RENAL DISEASE, CHRONIC 02/03/2009    Past Surgical History  Procedure Date  . Carotid artery angioplasty   . Prostate surgery     prostatectomy    No family history on file.  Social History:  reports that he quit smoking about 37 years ago. He has never used smokeless tobacco. He reports that he does not drink alcohol or use illicit drugs.  Allergies:  Allergies    Allergen Reactions  . Aspirin Other (See Comments)    High doses causes stomach ulcer and bleeding    Medications: I have reviewed the patient's current medications.  Results for orders placed during the hospital encounter of 02/20/12 (from the past 48 hour(s))  PREPARE PLATELET PHERESIS     Status: Normal (Preliminary result)   Collection Time   02/20/12  5:38 PM      Component Value Range Comment   Unit Number [id number]      Blood Component Type PLTPHER LR1      Unit division 00      Status of Unit ALLOCATED      Transfusion Status OK TO TRANSFUSE     TYPE AND SCREEN     Status: Normal (Preliminary result)   Collection Time   02/20/12  5:42 PM      Component Value Range Comment   ABO/RH(D) O POS      Antibody Screen PENDING      Sample Expiration 02/23/2012       Ct Abdomen Pelvis W Contrast  02/20/2012  *RADIOLOGY REPORT*  Clinical Data: Right lower quadrant abdominal pain.  CT ABDOMEN AND PELVIS WITH CONTRAST  Technique:  Multidetector CT imaging of the abdomen and pelvis was performed following the standard protocol during bolus administration of intravenous contrast.  Contrast: 124mL OMNIPAQUE IOHEXOL 300 MG/ML  SOLN  Comparison: None  Findings: The lung bases are clear.  The liver is unremarkable.  No focal lesions or biliary dilatation. The gallbladder is normal.  No common bile duct dilatation.  The pancreas is normal.  The spleen is normal in size.  No focal lesions.  There is a small right adrenal gland lesion which is most consistent with a benign adenoma.  The left adrenal gland is normal.  Both kidneys are normal except for A right renal cyst.  The stomach, duodenum, small bowel and colon are unremarkable.  The mid distal appendix is inflamed with mucosal enhancement and a mid appendiceal appendicolith.  Significant surrounding inflammation and fluid is noted.  Microperforation is possible.  No discrete abscess.  The bladder is unremarkable.  There are surgical changes from a  prostatectomy.  The seminal vesicles are unremarkable.  No pelvic mass or pelvic lymphadenopathy.  No inguinal mass or hernia.  The bony structures are intact.  IMPRESSION:  1.  Findings consistent with acute mid distal appendicitis with possible perforation.  A mid appendiceal appendicolith is noted. No discrete abscess. 2.  No other significant abdominal/pelvic findings.  Original Report Authenticated By: P. Kalman Jewels, M.D.   Dg Chest Portable 1 View  02/20/2012  *RADIOLOGY REPORT*  Clinical Data: Preoperative respiratory exam. Hypertension. Appendicitis.  PORTABLE CHEST - 1 VIEW  Comparison: 06/14/2007  Findings: The heart size and pulmonary vascularity are normal and the lungs are clear.  No significant osseous abnormality.  IMPRESSION: Normal chest.  Original Report Authenticated By: Larey Seat, M.D.    Review of Systems  Constitutional: Positive for fever.  Eyes: Negative for blurred vision and double vision.  Respiratory: Negative for cough, hemoptysis, sputum production and shortness of breath.   Cardiovascular: Negative for chest pain, palpitations, orthopnea and claudication.  Gastrointestinal: Positive for nausea, abdominal pain and diarrhea.  Genitourinary: Negative for dysuria and urgency.  Neurological: Negative for dizziness and headaches.   Blood pressure 179/85, pulse 108, temperature 103 F (39.4 C), temperature source Oral, resp. rate 18, SpO2 96.00%. Physical Exam  Constitutional: He is oriented to person, place, and time. He appears well-developed and well-nourished.  HENT:  Head: Normocephalic and atraumatic.  Eyes: Conjunctivae are normal. Pupils are equal, round, and reactive to light. Left eye exhibits no discharge. No scleral icterus.  Neck: Normal range of motion. Neck supple. No JVD present. No tracheal deviation present. No thyromegaly present.  Cardiovascular:       Tachycardic S1-S2 normal soft systolic murmur no S3 gallop  Respiratory: Effort normal  and breath sounds normal. No respiratory distress. He has no wheezes. He has no rales.  GI: Soft. Bowel sounds are normal.       Mildly distended right lower quadrant tenderness with minimal guarding noted  Musculoskeletal: He exhibits no edema and no tenderness.  Neurological: He is alert and oriented to person, place, and time.    Assessment/Plan: Acute appendicitis with crushable perforation Marked leukocytosis rule out abscess Hypertension Non-insulin-dependent diabetes mellitus Hypercholesteremia Cerebrovascular disease status post right internal carotid artery PTA in remote past stable Remote history of peptic ulcer disease GERD Degenerative joint disease Plan  Agree with DC Plavix and aspirin and transfusing platelets as needed. We'll start low-dose her beta blockers IV as per orders Discussed with the patient and family regarding emergency laparotomy in view of marked leukocytosis and tachycardia and possible perforation/abscess this risk and benefits and consents for surgery. Patient is a acceptable risk for surgery considering the emergency situation.  Clent Demark 02/20/2012, 7:22 PM

## 2012-02-21 ENCOUNTER — Encounter (HOSPITAL_COMMUNITY): Payer: Self-pay | Admitting: *Deleted

## 2012-02-21 LAB — PREPARE PLATELET PHERESIS: Unit division: 0

## 2012-02-21 LAB — BASIC METABOLIC PANEL
BUN: 20 mg/dL (ref 6–23)
Calcium: 9.2 mg/dL (ref 8.4–10.5)
Chloride: 101 mEq/L (ref 96–112)
Creatinine, Ser: 1.65 mg/dL — ABNORMAL HIGH (ref 0.50–1.35)
GFR calc Af Amer: 45 mL/min — ABNORMAL LOW (ref >90)

## 2012-02-21 LAB — HEMOGLOBIN A1C: Mean Plasma Glucose: 146 mg/dL — ABNORMAL HIGH (ref <117)

## 2012-02-21 LAB — GLUCOSE, CAPILLARY
Glucose-Capillary: 190 mg/dL — ABNORMAL HIGH (ref 70–99)
Glucose-Capillary: 120 mg/dL — ABNORMAL HIGH (ref 70–99)

## 2012-02-21 LAB — CBC
HCT: 37.1 % — ABNORMAL LOW (ref 39.0–52.0)
MCHC: 34.2 g/dL (ref 30.0–36.0)
MCV: 80.3 fL (ref 78.0–100.0)
RDW: 14.3 % (ref 11.5–15.5)

## 2012-02-21 MED ORDER — HYDROCODONE-ACETAMINOPHEN 5-325 MG PO TABS
1.0000 | ORAL_TABLET | ORAL | Status: DC | PRN
  Administered 2012-02-21 (×2): 1 via ORAL
  Filled 2012-02-21 (×2): qty 1

## 2012-02-21 MED ORDER — ACETAMINOPHEN 325 MG PO TABS
650.0000 mg | ORAL_TABLET | Freq: Four times a day (QID) | ORAL | Status: DC | PRN
  Administered 2012-02-22 (×3): 650 mg via ORAL
  Filled 2012-02-21 (×4): qty 2

## 2012-02-21 MED ORDER — ONDANSETRON HCL 4 MG/2ML IJ SOLN
4.0000 mg | Freq: Four times a day (QID) | INTRAMUSCULAR | Status: DC | PRN
  Administered 2012-02-24: 4 mg via INTRAVENOUS
  Filled 2012-02-21: qty 2

## 2012-02-21 MED ORDER — SODIUM CHLORIDE 0.9 % IV SOLN
1.0000 g | INTRAVENOUS | Status: DC
  Administered 2012-02-21 – 2012-02-27 (×7): 1 g via INTRAVENOUS
  Filled 2012-02-21 (×9): qty 1

## 2012-02-21 MED ORDER — AMLODIPINE-ATORVASTATIN 10-20 MG PO TABS: 1.0000 | ORAL_TABLET | Freq: Every day | ORAL | Status: DC

## 2012-02-21 MED ORDER — HEPARIN SODIUM (PORCINE) 5000 UNIT/ML IJ SOLN
5000.0000 [IU] | Freq: Three times a day (TID) | INTRAMUSCULAR | Status: DC
  Administered 2012-02-21 – 2012-02-28 (×19): 5000 [IU] via SUBCUTANEOUS
  Filled 2012-02-21 (×25): qty 1

## 2012-02-21 MED ORDER — DIPHENHYDRAMINE HCL 50 MG/ML IJ SOLN: 12.5000 mg | Freq: Four times a day (QID) | INTRAMUSCULAR | Status: DC | PRN

## 2012-02-21 MED ORDER — HYDROMORPHONE HCL PF 1 MG/ML IJ SOLN
INTRAMUSCULAR | Status: AC
  Filled 2012-02-21: qty 1

## 2012-02-21 MED ORDER — AMLODIPINE BESYLATE 10 MG PO TABS
10.0000 mg | ORAL_TABLET | Freq: Every day | ORAL | Status: DC
  Administered 2012-02-21 – 2012-02-28 (×8): 10 mg via ORAL
  Filled 2012-02-21 (×9): qty 1

## 2012-02-21 MED ORDER — DIPHENHYDRAMINE HCL 12.5 MG/5ML PO ELIX
12.5000 mg | ORAL_SOLUTION | Freq: Four times a day (QID) | ORAL | Status: DC | PRN
  Filled 2012-02-21: qty 5

## 2012-02-21 MED ORDER — MORPHINE SULFATE 2 MG/ML IJ SOLN
1.0000 mg | INTRAMUSCULAR | Status: DC | PRN
  Administered 2012-02-21 – 2012-02-24 (×10): 2 mg via INTRAVENOUS
  Filled 2012-02-21 (×11): qty 1

## 2012-02-21 MED ORDER — BETHANECHOL CHLORIDE 25 MG PO TABS
25.0000 mg | ORAL_TABLET | Freq: Three times a day (TID) | ORAL | Status: DC
Start: 2012-02-21 — End: 2012-02-28
  Administered 2012-02-22 – 2012-02-28 (×20): 25 mg via ORAL
  Filled 2012-02-21 (×25): qty 1

## 2012-02-21 MED ORDER — BIOTENE DRY MOUTH MT LIQD
15.0000 mL | Freq: Two times a day (BID) | OROMUCOSAL | Status: DC
  Administered 2012-02-21 – 2012-02-27 (×14): 15 mL via OROMUCOSAL

## 2012-02-21 MED ORDER — KCL IN DEXTROSE-NACL 20-5-0.45 MEQ/L-%-% IV SOLN
INTRAVENOUS | Status: DC
  Administered 2012-02-21 – 2012-02-22 (×4): via INTRAVENOUS
  Administered 2012-02-23 – 2012-02-24 (×2): 75 mL/h via INTRAVENOUS
  Administered 2012-02-25 – 2012-02-27 (×4): via INTRAVENOUS
  Filled 2012-02-21 (×14): qty 1000

## 2012-02-21 MED ORDER — LOSARTAN POTASSIUM 50 MG PO TABS
100.0000 mg | ORAL_TABLET | Freq: Every day | ORAL | Status: DC
  Administered 2012-02-21 – 2012-02-28 (×8): 100 mg via ORAL
  Filled 2012-02-21 (×9): qty 2

## 2012-02-21 MED ORDER — ACETAMINOPHEN 650 MG RE SUPP: 650.0000 mg | Freq: Four times a day (QID) | RECTAL | Status: DC | PRN

## 2012-02-21 MED ORDER — ATORVASTATIN CALCIUM 20 MG PO TABS
20.0000 mg | ORAL_TABLET | Freq: Every day | ORAL | Status: DC
  Administered 2012-02-21 – 2012-02-27 (×7): 20 mg via ORAL
  Filled 2012-02-21 (×10): qty 1

## 2012-02-21 MED ORDER — PANTOPRAZOLE SODIUM 40 MG IV SOLR
40.0000 mg | Freq: Every day | INTRAVENOUS | Status: DC
  Administered 2012-02-21 – 2012-02-25 (×5): 40 mg via INTRAVENOUS
  Filled 2012-02-21 (×6): qty 40

## 2012-02-21 MED ORDER — POTASSIUM CHLORIDE CRYS ER 20 MEQ PO TBCR
20.0000 meq | EXTENDED_RELEASE_TABLET | Freq: Every day | ORAL | Status: DC
  Administered 2012-02-21 – 2012-02-28 (×8): 20 meq via ORAL
  Filled 2012-02-21 (×11): qty 1

## 2012-02-21 NOTE — Progress Notes (Signed)
Patient ID: Austin [name], male   DOB: [birth year], 77 y.o.   MRN: [id number] 1 Day Post-Op  Subjective: Pt reports feeling better today, appropriate abd pain, denies n/v.  No flatus  Objective: Vital signs in last 24 hours: Temp:  [98 F (36.7 C)-103 F (39.4 C)] 100.9 F (38.3 C) (07/02 0512) Pulse Rate:  [96-114] 104  (07/02 0512) Resp:  [14-30] 16  (07/02 0512) BP: (141-203)/(68-90) 141/73 mmHg (07/02 0512) SpO2:  [93 %-98 %] 96 % (07/02 0512) Weight:  [186 lb (84.369 kg)-188 lb 4.4 oz (85.4 kg)] 188 lb 4.4 oz (85.4 kg) (07/02 0054)    Intake/Output from previous day: 07/01 0701 - 07/02 0700 In: 1000 [I.V.:1000] Out: 640 [Urine:500; Drains:140] Intake/Output this shift:    PE: Abd: mildly distended and expected tenderness, no bowel sounds, drain with bloody ouput, incisions ok  Lab Results:   Basename 02/21/12 0555 02/20/12 1149  WBC 18.8* 19.9 cH*  HGB 12.7* 14.8  HCT 37.1* 44.7  PLT 303 345.0   BMET  Basename 02/20/12 1149  NA 138  K 3.5  CL 100  CO2 28  GLUCOSE 173*  BUN 17  CREATININE 1.3  CALCIUM 9.6   PT/INR No results found for this basename: LABPROT:2,INR:2 in the last 72 hours CMP     Component Value Date/Time   NA 138 02/20/2012 1149   K 3.5 02/20/2012 1149   CL 100 02/20/2012 1149   CO2 28 02/20/2012 1149   GLUCOSE 173* 02/20/2012 1149   GLUCOSE 112* 06/19/2006 0948   BUN 17 02/20/2012 1149   CREATININE 1.3 02/20/2012 1149   CALCIUM 9.6 02/20/2012 1149   PROT 8.4* 02/20/2012 1149   ALBUMIN 4.2 02/20/2012 1149   AST 16 02/20/2012 1149   ALT 9 02/20/2012 1149   ALKPHOS 87 02/20/2012 1149   BILITOT 0.9 02/20/2012 1149   GFRNONAA 61.24 05/10/2010 1032   GFRAA  Value: 52        The eGFR has been calculated using the MDRD equation. This calculation has not been validated in all clinical situations. eGFR's persistently <60 mL/min signify possible Chronic Kidney Disease.* 11/07/2009 1926   Lipase  No results found for this basename: lipase       Studies/Results: Ct  Abdomen Pelvis W Contrast  02/20/2012  *RADIOLOGY REPORT*  Clinical Data: Right lower quadrant abdominal pain.  CT ABDOMEN AND PELVIS WITH CONTRAST  Technique:  Multidetector CT imaging of the abdomen and pelvis was performed following the standard protocol during bolus administration of intravenous contrast.  Contrast: 139mL OMNIPAQUE IOHEXOL 300 MG/ML  SOLN  Comparison: None  Findings: The lung bases are clear.  The liver is unremarkable.  No focal lesions or biliary dilatation. The gallbladder is normal.  No common bile duct dilatation.  The pancreas is normal.  The spleen is normal in size.  No focal lesions.  There is a small right adrenal gland lesion which is most consistent with a benign adenoma.  The left adrenal gland is normal.  Both kidneys are normal except for A right renal cyst.  The stomach, duodenum, small bowel and colon are unremarkable.  The mid distal appendix is inflamed with mucosal enhancement and a mid appendiceal appendicolith.  Significant surrounding inflammation and fluid is noted.  Microperforation is possible.  No discrete abscess.  The bladder is unremarkable.  There are surgical changes from a prostatectomy.  The seminal vesicles are unremarkable.  No pelvic mass or pelvic lymphadenopathy.  No inguinal mass or hernia.  The  bony structures are intact.  IMPRESSION:  1.  Findings consistent with acute mid distal appendicitis with possible perforation.  A mid appendiceal appendicolith is noted. No discrete abscess. 2.  No other significant abdominal/pelvic findings.  Original Report Authenticated By: P. Kalman Jewels, M.D.   Dg Chest Portable 1 View  02/20/2012  *RADIOLOGY REPORT*  Clinical Data: Preoperative respiratory exam. Hypertension. Appendicitis.  PORTABLE CHEST - 1 VIEW  Comparison: 06/14/2007  Findings: The heart size and pulmonary vascularity are normal and the lungs are clear.  No significant osseous abnormality.  IMPRESSION: Normal chest.  Original Report Authenticated  By: Larey Seat, M.D.    Anti-infectives: Anti-infectives     Start     Dose/Rate Route Frequency Ordered Stop   02/21/12 1800   ertapenem (INVANZ) 1 g in sodium chloride 0.9 % 50 mL IVPB        1 g 100 mL/hr over 30 Minutes Intravenous Every 24 hours 02/21/12 0101     02/20/12 1800   ertapenem (INVANZ) 1 g in sodium chloride 0.9 % 50 mL IVPB        1 g 100 mL/hr over 30 Minutes Intravenous To Emergency Dept 02/20/12 1724 02/20/12 1831           Assessment/Plan  1.  Acute appendicitis-POD#1: doing well overall, pain controlled, ice chips-high chance of ileus given extent of surgery-will advance slowly, on Invanz and will need this for atleast 3 days and then home with po abx.  Up out of bed and ambulate.    LOS: 1 day    Judieth Mckown 02/21/2012

## 2012-02-21 NOTE — Progress Notes (Signed)
Nursing Admission Note  Pt arrived to [id number] via stretcher from PACU. Pt is asleep, arouseable and A&OX4. Wife at bedside. Telemetry in place. Pt has 1 small LUQ incision and 1 small LLQ incision. JP drain present in LLQ; patent for sanguinous drainage. Skin is otherwise intact. Educated to unit and surroundings. Call bell within reach. Will continue to monitor. C.Kaicen Desena, RN.

## 2012-02-21 NOTE — Progress Notes (Signed)
Still very sore, especially on the right side.  Drain okay.  Hypoactive bowel sounds.  Austin [name]. [name], MD, [place] [phone] [facility] Surgery

## 2012-02-21 NOTE — Progress Notes (Signed)
Subjective:  Patient complains of vague abdominal pain. Tolerated laparoscopic appendectomy yesterday  Objective:  Vital Signs in the last 24 hours: Temp:  [98 F (36.7 C)-103 F (39.4 C)] 100.1 F (37.8 C) (07/02 0904) Pulse Rate:  [92-114] 92  (07/02 0904) Resp:  [14-30] 16  (07/02 0904) BP: (133-203)/(67-85) 133/67 mmHg (07/02 0904) SpO2:  [93 %-98 %] 95 % (07/02 0904) Weight:  [85.4 kg (188 lb 4.4 oz)] 85.4 kg (188 lb 4.4 oz) (07/02 0054)  Intake/Output from previous day: 07/01 0701 - 07/02 0700 In: 1000 [I.V.:1000] Out: 640 [Urine:500; Drains:140] Intake/Output from this shift:    Physical Exam: Neck: no adenopathy, no carotid bruit, no JVD and supple, symmetrical, trachea midline Lungs: clear to auscultation bilaterally Heart: regular rate and rhythm, S1, S2 normal and Soft systolic murmur no S3 gallop Abdomen: Soft faint bowel sounds present mild generalized tenderness Extremities: extremities normal, atraumatic, no cyanosis or edema  Lab Results:  Basename 02/21/12 0555 02/20/12 1149  WBC 18.8* 19.9 cH*  HGB 12.7* 14.8  PLT 303 345.0    Basename 02/21/12 0555 02/20/12 1149  NA 141 138  K 3.1* 3.5  CL 101 100  CO2 26 28  GLUCOSE 149* 173*  BUN 20 17  CREATININE 1.65* 1.3   No results found for this basename: TROPONINI:2,CK,MB:2 in the last 72 hours Hepatic Function Panel  Basename 02/20/12 1149  PROT 8.4*  ALBUMIN 4.2  AST 16  ALT 9  ALKPHOS 87  BILITOT 0.9  BILIDIR --  IBILI --   No results found for this basename: CHOL in the last 72 hours No results found for this basename: PROTIME in the last 72 hours  Imaging: Imaging results have been reviewed and Ct Abdomen Pelvis W Contrast  02/20/2012  *RADIOLOGY REPORT*  Clinical Data: Right lower quadrant abdominal pain.  CT ABDOMEN AND PELVIS WITH CONTRAST  Technique:  Multidetector CT imaging of the abdomen and pelvis was performed following the standard protocol during bolus administration of  intravenous contrast.  Contrast: 121mL OMNIPAQUE IOHEXOL 300 MG/ML  SOLN  Comparison: None  Findings: The lung bases are clear.  The liver is unremarkable.  No focal lesions or biliary dilatation. The gallbladder is normal.  No common bile duct dilatation.  The pancreas is normal.  The spleen is normal in size.  No focal lesions.  There is a small right adrenal gland lesion which is most consistent with a benign adenoma.  The left adrenal gland is normal.  Both kidneys are normal except for A right renal cyst.  The stomach, duodenum, small bowel and colon are unremarkable.  The mid distal appendix is inflamed with mucosal enhancement and a mid appendiceal appendicolith.  Significant surrounding inflammation and fluid is noted.  Microperforation is possible.  No discrete abscess.  The bladder is unremarkable.  There are surgical changes from a prostatectomy.  The seminal vesicles are unremarkable.  No pelvic mass or pelvic lymphadenopathy.  No inguinal mass or hernia.  The bony structures are intact.  IMPRESSION:  1.  Findings consistent with acute mid distal appendicitis with possible perforation.  A mid appendiceal appendicolith is noted. No discrete abscess. 2.  No other significant abdominal/pelvic findings.  Original Report Authenticated By: P. Kalman Jewels, M.D.   Dg Chest Portable 1 View  02/20/2012  *RADIOLOGY REPORT*  Clinical Data: Preoperative respiratory exam. Hypertension. Appendicitis.  PORTABLE CHEST - 1 VIEW  Comparison: 06/14/2007  Findings: The heart size and pulmonary vascularity are normal and the lungs are  clear.  No significant osseous abnormality.  IMPRESSION: Normal chest.  Original Report Authenticated By: Larey Seat, M.D.    Cardiac Studies:  Assessment/Plan:  Acute perforated appendicitis with peritonitis status post laparoscopic appendectomy Hypertension  Non-insulin-dependent diabetes mellitus  Hypercholesteremia  Cerebrovascular disease status post right internal  carotid artery PTA in remote past stable  Remote history of peptic ulcer disease  GERD  Degenerative joint disease Acute renal insufficiency Mild dehydration Plan Continue slow hydration as per orders Check labs in a.m. Out of bed to chair Incentive spirometry  LOS: 1 day    Austin [name] N 02/21/2012, 12:02 PM

## 2012-02-21 NOTE — Progress Notes (Signed)
Pt sat upright in the chair for 4.5 hours today. Pt tolerated well. Pt's abdomen is very painful, but he is able to stand (with two person assist) and pivot, and take very small steps. Pt only ambulated from bed to chair to bed. Plan to progress ambulation tomorrow.

## 2012-02-22 LAB — GLUCOSE, CAPILLARY
Glucose-Capillary: 140 mg/dL — ABNORMAL HIGH (ref 70–99)
Glucose-Capillary: 123 mg/dL — ABNORMAL HIGH (ref 70–99)

## 2012-02-22 LAB — BASIC METABOLIC PANEL
BUN: 28 mg/dL — ABNORMAL HIGH (ref 6–23)
CO2: 27 mEq/L (ref 19–32)
Chloride: 100 mEq/L (ref 96–112)
GFR calc Af Amer: 40 mL/min — ABNORMAL LOW (ref >90)
Potassium: 3.6 mEq/L (ref 3.5–5.1)

## 2012-02-22 LAB — CBC
HCT: 36.7 % — ABNORMAL LOW (ref 39.0–52.0)
Hemoglobin: 12.5 g/dL — ABNORMAL LOW (ref 13.0–17.0)
MCHC: 34.1 g/dL (ref 30.0–36.0)
MCV: 80.5 fL (ref 78.0–100.0)

## 2012-02-22 NOTE — Progress Notes (Signed)
Patient ID: Austin [name], male   DOB: 1935/06/25, 77 y.o.   MRN: [id number] Patient ID: Austin [name], male   DOB: 11-Feb-1935, 77 y.o.   MRN: [id number] 2 Days Post-Op  Subjective: Pt reports no changes overnight, denies n/v or significant abd pain.  Appropriate soreness.  Tolerating ice chips well.  Objective: Vital signs in last 24 hours: Temp:  [100.1 F (37.8 C)-102.8 F (39.3 C)] 102.8 F (39.3 C) (07/03 0533) Pulse Rate:  [92-103] 103  (07/03 0533) Resp:  [16-18] 18  (07/03 0533) BP: (133-168)/(67-78) 147/70 mmHg (07/03 0533) SpO2:  [93 %-95 %] 94 % (07/03 0533) Weight:  [188 lb 4.4 oz (85.4 kg)] 188 lb 4.4 oz (85.4 kg) (07/02 2024) Last BM Date:  (prior to admission)  Intake/Output from previous day: 07/02 0701 - 07/03 0700 In: 1520 [P.O.:570; I.V.:900; IV Piggyback:50] Out: 1425 [Urine:1300; Drains:125] Intake/Output this shift:    PE: Abd: mildly distended and expected tenderness, faint bowel sounds, drain with bloody ouput, incisions ok  Lab Results:   Basename 02/22/12 0620 02/21/12 0555  WBC 18.0* 18.8*  HGB 12.5* 12.7*  HCT 36.7* 37.1*  PLT 275 303   BMET  Basename 02/22/12 0620 02/21/12 0555  NA 138 141  K 3.6 3.1*  CL 100 101  CO2 27 26  GLUCOSE 151* 149*  BUN 28* 20  CREATININE 1.82* 1.65*  CALCIUM 9.4 9.2   PT/INR No results found for this basename: LABPROT:2,INR:2 in the last 72 hours CMP     Component Value Date/Time   NA 138 02/22/2012 0620   K 3.6 02/22/2012 0620   CL 100 02/22/2012 0620   CO2 27 02/22/2012 0620   GLUCOSE 151* 02/22/2012 0620   GLUCOSE 112* 06/19/2006 0948   BUN 28* 02/22/2012 0620   CREATININE 1.82* 02/22/2012 0620   CALCIUM 9.4 02/22/2012 0620   PROT 8.4* 02/20/2012 1149   ALBUMIN 4.2 02/20/2012 1149   AST 16 02/20/2012 1149   ALT 9 02/20/2012 1149   ALKPHOS 87 02/20/2012 1149   BILITOT 0.9 02/20/2012 1149   GFRNONAA 35* 02/22/2012 0620   GFRAA 40* 02/22/2012 0620   Lipase  No results found for this basename: lipase        Studies/Results: Ct Abdomen Pelvis W Contrast  02/20/2012  *RADIOLOGY REPORT*  Clinical Data: Right lower quadrant abdominal pain.  CT ABDOMEN AND PELVIS WITH CONTRAST  Technique:  Multidetector CT imaging of the abdomen and pelvis was performed following the standard protocol during bolus administration of intravenous contrast.  Contrast: 139mL OMNIPAQUE IOHEXOL 300 MG/ML  SOLN  Comparison: None  Findings: The lung bases are clear.  The liver is unremarkable.  No focal lesions or biliary dilatation. The gallbladder is normal.  No common bile duct dilatation.  The pancreas is normal.  The spleen is normal in size.  No focal lesions.  There is a small right adrenal gland lesion which is most consistent with a benign adenoma.  The left adrenal gland is normal.  Both kidneys are normal except for A right renal cyst.  The stomach, duodenum, small bowel and colon are unremarkable.  The mid distal appendix is inflamed with mucosal enhancement and a mid appendiceal appendicolith.  Significant surrounding inflammation and fluid is noted.  Microperforation is possible.  No discrete abscess.  The bladder is unremarkable.  There are surgical changes from a prostatectomy.  The seminal vesicles are unremarkable.  No pelvic mass or pelvic lymphadenopathy.  No inguinal mass or hernia.  The bony structures  are intact.  IMPRESSION:  1.  Findings consistent with acute mid distal appendicitis with possible perforation.  A mid appendiceal appendicolith is noted. No discrete abscess. 2.  No other significant abdominal/pelvic findings.  Original Report Authenticated By: P. Kalman Jewels, M.D.   Dg Chest Portable 1 View  02/20/2012  *RADIOLOGY REPORT*  Clinical Data: Preoperative respiratory exam. Hypertension. Appendicitis.  PORTABLE CHEST - 1 VIEW  Comparison: 06/14/2007  Findings: The heart size and pulmonary vascularity are normal and the lungs are clear.  No significant osseous abnormality.  IMPRESSION: Normal chest.   Original Report Authenticated By: Larey Seat, M.D.    Anti-infectives: Anti-infectives     Start     Dose/Rate Route Frequency Ordered Stop   02/21/12 1800   ertapenem (INVANZ) 1 g in sodium chloride 0.9 % 50 mL IVPB        1 g 100 mL/hr over 30 Minutes Intravenous Every 24 hours 02/21/12 0101     02/20/12 1800   ertapenem (INVANZ) 1 g in sodium chloride 0.9 % 50 mL IVPB        1 g 100 mL/hr over 30 Minutes Intravenous To Emergency Dept 02/20/12 1724 02/20/12 1831           Assessment/Plan  1.  Acute appendicitis-POD#3: pain controlled, ice chips-high chance of ileus given extent of surgery-will advance slowly, on Invanz but WBC still 18 today and pt febrile.  Pt clinically looks ok but with being febrile and WBC still elevated will check urine culture and talk to Dr. Hulen Skains about need for repeat CT.  Pts abd is not overly tender.  Will hold on advancing diet for now.  LOS: 2 days    Zaivion Kundrat 02/22/2012

## 2012-02-22 NOTE — Progress Notes (Signed)
Pt noted to have temp of 102.8. Pt rec'd 650mg  prn tylenol. Pt has already had blood cultures x2 obtained and is already receiving IV Invanz. Will continue to monitor. C.Phillippa Straub, RN.

## 2012-02-22 NOTE — Progress Notes (Signed)
Pt stated he had been having some problems voiding today. Abdomen noted to be distended. Bladder scanned for >960mls, then pt voided >376mls in the urinal. Dr. Grandville Silos notified and new orders received. I&O cath performed on patient - emptied bladder for 831mls cloudy amber urine. Pt tolerated well. Will recheck in the AM. C.Khalessi Blough, RN.

## 2012-02-22 NOTE — Progress Notes (Signed)
Subjective:  Patient denies any chest pain or shortness of breath states feeling slightly better today. No BM  Or flatus yet Objective:  Vital Signs in the last 24 hours: Temp:  [100.3 F (37.9 C)-102.8 F (39.3 C)] 101.7 F (38.7 C) (07/03 0954) Pulse Rate:  [94-103] 101  (07/03 0954) Resp:  [16-18] 16  (07/03 0954) BP: (136-168)/(61-78) 136/61 mmHg (07/03 0954) SpO2:  [90 %-94 %] 90 % (07/03 0954) Weight:  [85.4 kg (188 lb 4.4 oz)] 85.4 kg (188 lb 4.4 oz) (07/02 2024)  Intake/Output from previous day: 07/02 0701 - 07/03 0700 In: 1520 [P.O.:570; I.V.:900; IV Piggyback:50] Out: 1425 [Urine:1300; Drains:125] Intake/Output from this shift: Total I/O In: 0  Out: 230 [Urine:230]  Physical Exam: Neck: no adenopathy, no carotid bruit, no JVD and supple, symmetrical, trachea midline Lungs: clear to auscultation bilaterally Heart: regular rate and rhythm, S1, S2 normal, no murmur, click, rub or gallop Abdomen: Soft faint bowel sounds mildly distended mild tenderness right lower quadrant Extremities: extremities normal, atraumatic, no cyanosis or edema  Lab Results:  Basename 02/22/12 0620 02/21/12 0555  WBC 18.0* 18.8*  HGB 12.5* 12.7*  PLT 275 303    Basename 02/22/12 0620 02/21/12 0555  NA 138 141  K 3.6 3.1*  CL 100 101  CO2 27 26  GLUCOSE 151* 149*  BUN 28* 20  CREATININE 1.82* 1.65*   No results found for this basename: TROPONINI:2,CK,MB:2 in the last 72 hours Hepatic Function Panel  Basename 02/20/12 1149  PROT 8.4*  ALBUMIN 4.2  AST 16  ALT 9  ALKPHOS 87  BILITOT 0.9  BILIDIR --  IBILI --   No results found for this basename: CHOL in the last 72 hours No results found for this basename: PROTIME in the last 72 hours  Imaging: Imaging results have been reviewed and Ct Abdomen Pelvis W Contrast  02/20/2012  *RADIOLOGY REPORT*  Clinical Data: Right lower quadrant abdominal pain.  CT ABDOMEN AND PELVIS WITH CONTRAST  Technique:  Multidetector CT imaging of  the abdomen and pelvis was performed following the standard protocol during bolus administration of intravenous contrast.  Contrast: 164mL OMNIPAQUE IOHEXOL 300 MG/ML  SOLN  Comparison: None  Findings: The lung bases are clear.  The liver is unremarkable.  No focal lesions or biliary dilatation. The gallbladder is normal.  No common bile duct dilatation.  The pancreas is normal.  The spleen is normal in size.  No focal lesions.  There is a small right adrenal gland lesion which is most consistent with a benign adenoma.  The left adrenal gland is normal.  Both kidneys are normal except for A right renal cyst.  The stomach, duodenum, small bowel and colon are unremarkable.  The mid distal appendix is inflamed with mucosal enhancement and a mid appendiceal appendicolith.  Significant surrounding inflammation and fluid is noted.  Microperforation is possible.  No discrete abscess.  The bladder is unremarkable.  There are surgical changes from a prostatectomy.  The seminal vesicles are unremarkable.  No pelvic mass or pelvic lymphadenopathy.  No inguinal mass or hernia.  The bony structures are intact.  IMPRESSION:  1.  Findings consistent with acute mid distal appendicitis with possible perforation.  A mid appendiceal appendicolith is noted. No discrete abscess. 2.  No other significant abdominal/pelvic findings.  Original Report Authenticated By: P. Kalman Jewels, M.D.   Dg Chest Portable 1 View  02/20/2012  *RADIOLOGY REPORT*  Clinical Data: Preoperative respiratory exam. Hypertension. Appendicitis.  PORTABLE CHEST -  1 VIEW  Comparison: 06/14/2007  Findings: The heart size and pulmonary vascularity are normal and the lungs are clear.  No significant osseous abnormality.  IMPRESSION: Normal chest.  Original Report Authenticated By: Larey Seat, M.D.    Cardiac Studies:  Assessment/Plan:  Acute perforated appendicitis with peritonitis status post laparoscopic appendectomy postop day 2 Marked  leukocytosis Hypertension  Non-insulin-dependent diabetes mellitus  Hypercholesteremia  Cerebrovascular disease status post right internal carotid artery PTA in remote past stable  Remote history of peptic ulcer disease  GERD  Degenerative joint disease  Acute renal insufficiency  Mild dehydration Plan Continue present management Follow renal function in a.m.  LOS: 2 days    Austin [name] 02/22/2012, 12:57 PM

## 2012-02-22 NOTE — ED Provider Notes (Signed)
Medical screening examination/treatment/procedure(s) were performed by non-physician practitioner and as supervising physician I was immediately available for consultation/collaboration.  Jasper Riling. Alvino Chapel, MD 02/22/12 0010

## 2012-02-22 NOTE — Progress Notes (Signed)
POD # 2, wound not consider CT yet.  Although WBC elevated, on its way down.  Only POD #2.  CT would be very limited in its usefulness at this time.  Would continue antibiotics and minimal PO intake for now.  Kathryne Eriksson. [name], MD, [place] [phone] [facility]

## 2012-02-23 LAB — URINE CULTURE
Colony Count: NO GROWTH
Culture: NO GROWTH

## 2012-02-23 LAB — GLUCOSE, CAPILLARY
Glucose-Capillary: 162 mg/dL — ABNORMAL HIGH (ref 70–99)
Glucose-Capillary: 186 mg/dL — ABNORMAL HIGH (ref 70–99)

## 2012-02-23 LAB — BASIC METABOLIC PANEL
GFR calc Af Amer: 43 mL/min — ABNORMAL LOW (ref >90)
GFR calc non Af Amer: 37 mL/min — ABNORMAL LOW (ref >90)
Potassium: 4.1 mEq/L (ref 3.5–5.1)
Sodium: 138 mEq/L (ref 135–145)

## 2012-02-23 LAB — CBC
MCHC: 33.4 g/dL (ref 30.0–36.0)
Platelets: 295 10*3/uL (ref 150–400)
RDW: 14.4 % (ref 11.5–15.5)

## 2012-02-23 NOTE — Progress Notes (Signed)
Subjective:  Patient denies any chest pain or shortness of breath no BM or flatus yet. No nausea or vomiting  Objective:  Vital Signs in the last 24 hours: Temp:  [97.6 F (36.4 C)-102.8 F (39.3 C)] 99.8 F (37.7 C) (07/04 0920) Pulse Rate:  [88-108] 92  (07/04 0920) Resp:  [16-18] 18  (07/04 0920) BP: (125-165)/(61-77) 159/77 mmHg (07/04 0920) SpO2:  [90 %-93 %] 93 % (07/04 0920) Weight:  [87.952 kg (193 lb 14.4 oz)] 87.952 kg (193 lb 14.4 oz) (07/03 2150)  Intake/Output from previous day: 07/03 0701 - 07/04 0700 In: 2825 [I.V.:2775; IV Piggyback:50] Out: [id number] [Urine:1680; Drains:70] Intake/Output from this shift: Total I/O In: 0  Out: 625 [Urine:625]  Physical Exam: Neck: no adenopathy, no carotid bruit, no JVD and supple, symmetrical, trachea midline Lungs: clear to auscultation bilaterally Heart: regular rate and rhythm, S1, S2 normal, no murmur, click, rub or gallop Abdomen: Soft mildly distended bowel sounds improved mild generalized tenderness Extremities: extremities normal, atraumatic, no cyanosis or edema  Lab Results:  Basename 02/23/12 0605 02/22/12 0620  WBC 14.8* 18.0*  HGB 12.5* 12.5*  PLT 295 275    Basename 02/23/12 0605 02/22/12 0620  NA 138 138  K 4.1 3.6  CL 103 100  CO2 27 27  GLUCOSE 173* 151*  BUN 26* 28*  CREATININE 1.71* 1.82*   No results found for this basename: TROPONINI:2,CK,MB:2 in the last 72 hours Hepatic Function Panel  Basename 02/20/12 1149  PROT 8.4*  ALBUMIN 4.2  AST 16  ALT 9  ALKPHOS 87  BILITOT 0.9  BILIDIR --  IBILI --   No results found for this basename: CHOL in the last 72 hours No results found for this basename: PROTIME in the last 72 hours  Imaging: Imaging results have been reviewed and No results found.  Cardiac Studies:  Assessment/Plan:  Acute perforated appendicitis with peritonitis status post laparoscopic appendectomy postop day 3 Resolving postop ileus Marked leukocytosis  Hypertension    Non-insulin-dependent diabetes mellitus  Hypercholesteremia  Cerebrovascular disease status post right internal carotid artery PTA in remote past stable  Remote history of peptic ulcer disease  GERD  Degenerative joint disease  Acute renal insufficiency improving Mild dehydration Plan Continue present management Check labs in a.m.  LOS: 3 days    Mikle Sternberg N 02/23/2012, 9:25 AM

## 2012-02-23 NOTE — Progress Notes (Signed)
3 Days Post-Op  Subjective: Not much pain or nausea. Not had a BM or passed gas. Ambulating in halls  Objective: Vital signs in last 24 hours: Temp:  [97.6 F (36.4 C)-102.8 F (39.3 C)] 100.1 F (37.8 C) (07/04 0546) Pulse Rate:  [88-108] 92  (07/04 0546) Resp:  [16-18] 16  (07/04 0546) BP: (125-165)/(61-75) 153/75 mmHg (07/04 0546) SpO2:  [90 %-93 %] 92 % (07/04 0546) Weight:  [193 lb 14.4 oz (87.952 kg)] 193 lb 14.4 oz (87.952 kg) (07/03 2150)   Intake/Output from previous day: 07/03 0701 - 07/04 0700 In: 2825 [I.V.:2775; IV Piggyback:50] Out: [id number] [Urine:1680; Drains:70] Intake/Output this shift:     General appearance: alert, cooperative and no distress Resp: clear to auscultation bilaterally Cardio: regular rate and rhythm, S1, S2 normal, no murmur, click, rub or gallop GI: Soft, distended, minimally tender no BS  Incision: healing well, Drain thin fluid  Lab Results:   Basename 02/23/12 0605 02/22/12 0620  WBC 14.8* 18.0*  HGB 12.5* 12.5*  HCT 37.4* 36.7*  PLT 295 275   BMET  Basename 02/23/12 0605 02/22/12 0620  NA 138 138  K 4.1 3.6  CL 103 100  CO2 27 27  GLUCOSE 173* 151*  BUN 26* 28*  CREATININE 1.71* 1.82*  CALCIUM 9.4 9.4   PT/INR No results found for this basename: LABPROT:2,INR:2 in the last 72 hours ABG No results found for this basename: PHART:2,PCO2:2,PO2:2,HCO3:2 in the last 72 hours  MEDS, Scheduled    . amLODipine  10 mg Oral Daily   And  . atorvastatin  20 mg Oral q1800  . antiseptic oral rinse  15 mL Mouth Rinse BID  . bethanechol  25 mg Oral TID  . ertapenem (INVANZ) IV  1 g Intravenous Q24H  . heparin  5,000 Units Subcutaneous Q8H  . insulin aspart  0-15 Units Subcutaneous TID WC  . insulin aspart  0-5 Units Subcutaneous QHS  . insulin aspart  4 Units Subcutaneous TID WC  . losartan  100 mg Oral Daily  . pantoprazole (PROTONIX) IV  40 mg Intravenous QHS  . potassium chloride SA  20 mEq Oral Daily     Studies/Results: No results found.  Assessment: s/p Procedure(s): APPENDECTOMY LAPAROSCOPIC Persistent ileus secondary to the peritonitis WBC improving Still febrile  Plan: Continuie npo and antibiotics, repeat wbc in am   LOS: 3 days     Austin Lasso, MD, [facility], [place] [phone]   02/23/2012 8:01 AM

## 2012-02-24 LAB — BASIC METABOLIC PANEL
BUN: 21 mg/dL (ref 6–23)
GFR calc non Af Amer: 52 mL/min — ABNORMAL LOW (ref >90)
Glucose, Bld: 176 mg/dL — ABNORMAL HIGH (ref 70–99)
Potassium: 3.6 mEq/L (ref 3.5–5.1)

## 2012-02-24 LAB — CBC
HCT: 37.6 % — ABNORMAL LOW (ref 39.0–52.0)
Hemoglobin: 12.8 g/dL — ABNORMAL LOW (ref 13.0–17.0)
MCHC: 34 g/dL (ref 30.0–36.0)
MCV: 80.5 fL (ref 78.0–100.0)

## 2012-02-24 LAB — GLUCOSE, CAPILLARY
Glucose-Capillary: 148 mg/dL — ABNORMAL HIGH (ref 70–99)
Glucose-Capillary: 201 mg/dL — ABNORMAL HIGH (ref 70–99)

## 2012-02-24 NOTE — Progress Notes (Signed)
Patient ID: Peniel Andelin, male   DOB: 1935-04-30, 77 y.o.   MRN: [id number] 4 Days Post-Op  Subjective: Pt reports no changes overnight, denies n/v or significant abd pain.  Appropriate soreness.  Does feel a little hungry, walking halls well.  Objective: Vital signs in last 24 hours: Temp:  [98.6 F (37 C)-100 F (37.8 C)] 98.8 F (37.1 C) (07/05 0851) Pulse Rate:  [87-97] 91  (07/05 0851) Resp:  [17-24] 17  (07/05 0851) BP: (145-172)/(68-83) 151/83 mmHg (07/05 0851) SpO2:  [91 %-95 %] 95 % (07/05 0851) Weight:  [186 lb 4.8 oz (84.505 kg)] 186 lb 4.8 oz (84.505 kg) (07/04 2028) Last BM Date: 02/19/12  Intake/Output from previous day: 07/04 0701 - 07/05 0700 In: 1240 [P.O.:60; I.V.:1180] Out: 2670 [Urine:2600; Drains:70] Intake/Output this shift: Total I/O In: 30 [P.O.:30] Out: -   PE: Abd: mildly distended and expected tenderness, better bowel sounds, drain with serosang ouput, incisions ok  Lab Results:   Basename 02/24/12 0515 02/23/12 0605  WBC 11.0* 14.8*  HGB 12.8* 12.5*  HCT 37.6* 37.4*  PLT 354 295   BMET  Basename 02/24/12 0515 02/23/12 0605  NA 142 138  K 3.6 4.1  CL 104 103  CO2 27 27  GLUCOSE 176* 173*  BUN 21 26*  CREATININE 1.31 1.71*  CALCIUM 9.8 9.4   PT/INR No results found for this basename: LABPROT:2,INR:2 in the last 72 hours CMP     Component Value Date/Time   NA 142 02/24/2012 0515   K 3.6 02/24/2012 0515   CL 104 02/24/2012 0515   CO2 27 02/24/2012 0515   GLUCOSE 176* 02/24/2012 0515   GLUCOSE 112* 06/19/2006 0948   BUN 21 02/24/2012 0515   CREATININE 1.31 02/24/2012 0515   CALCIUM 9.8 02/24/2012 0515   PROT 8.4* 02/20/2012 1149   ALBUMIN 4.2 02/20/2012 1149   AST 16 02/20/2012 1149   ALT 9 02/20/2012 1149   ALKPHOS 87 02/20/2012 1149   BILITOT 0.9 02/20/2012 1149   GFRNONAA 52* 02/24/2012 0515   GFRAA 60* 02/24/2012 0515   Lipase  No results found for this basename: lipase       Studies/Results: No results  found.  Anti-infectives: Anti-infectives     Start     Dose/Rate Route Frequency Ordered Stop   02/21/12 1800   ertapenem (INVANZ) 1 g in sodium chloride 0.9 % 50 mL IVPB        1 g 100 mL/hr over 30 Minutes Intravenous Every 24 hours 02/21/12 0101     02/20/12 1800   ertapenem (INVANZ) 1 g in sodium chloride 0.9 % 50 mL IVPB        1 g 100 mL/hr over 30 Minutes Intravenous To Emergency Dept 02/20/12 1724 02/20/12 1831           Assessment/Plan  1.  Acute appendicitis-POD#5: pain controlled, will try clears today-high chance of ileus given extent of surgery-will advance slowly, on Invanz-WBC trending down.    LOS: 4 days    Mary-Anne Polizzi 02/24/2012

## 2012-02-24 NOTE — Progress Notes (Signed)
Abdomen still very distended.  Would not advance diet.  Keep clear liquids.  Austin [name]. [name], MD, [place] [phone] [facility]

## 2012-02-24 NOTE — Progress Notes (Signed)
Subjective:  Patient denies any chest pain or shortness of breath abdominal pain markedly improved. States had flatus but has no BM yet renal function improved  to baseline  Objective:  Vital Signs in the last 24 hours: Temp:  [98.6 F (37 C)-100 F (37.8 C)] 98.8 F (37.1 C) (07/05 0851) Pulse Rate:  [87-97] 91  (07/05 0851) Resp:  [17-24] 17  (07/05 0851) BP: (145-172)/(68-83) 151/83 mmHg (07/05 0851) SpO2:  [91 %-95 %] 95 % (07/05 0851) Weight:  [84.505 kg (186 lb 4.8 oz)] 84.505 kg (186 lb 4.8 oz) (07/04 2028)  Intake/Output from previous day: 07/04 0701 - 07/05 0700 In: 1240 [P.O.:60; I.V.:1180] Out: 2670 [Urine:2600; Drains:70] Intake/Output from this shift: Total I/O In: 30 [P.O.:30] Out: -   Physical Exam: Neck: no adenopathy, no carotid bruit, no JVD and supple, symmetrical, trachea midline Lungs: clear to auscultation bilaterally Heart: regular rate and rhythm, S1, S2 normal, no murmur, click, rub or gallop Abdomen: Soft bowel sounds noted mild tenderness right lower quadrant improved Extremities: extremities normal, atraumatic, no cyanosis or edema  Lab Results:  Basename 02/24/12 0515 02/23/12 0605  WBC 11.0* 14.8*  HGB 12.8* 12.5*  PLT 354 295    Basename 02/24/12 0515 02/23/12 0605  NA 142 138  K 3.6 4.1  CL 104 103  CO2 27 27  GLUCOSE 176* 173*  BUN 21 26*  CREATININE 1.31 1.71*   No results found for this basename: TROPONINI:2,CK,MB:2 in the last 72 hours Hepatic Function Panel No results found for this basename: PROT,ALBUMIN,AST,ALT,ALKPHOS,BILITOT,BILIDIR,IBILI in the last 72 hours No results found for this basename: CHOL in the last 72 hours No results found for this basename: PROTIME in the last 72 hours  Imaging: Imaging results have been reviewed and No results found.  Cardiac Studies:  Assessment/Plan:  Acute perforated appendicitis with peritonitis status post laparoscopic appendectomy postop day 3  Resolving postop ileus  Marked  leukocytosis  Hypertension  Non-insulin-dependent diabetes mellitus  Hypercholesteremia  Cerebrovascular disease status post right internal carotid artery PTA in remote past stable  Remote history of peptic ulcer disease  GERD  Degenerative joint disease  Plan Continue present management per surgery I will sign off please call if needed  LOS: 4 days    Sheralyn Pinegar N 02/24/2012, 12:23 PM

## 2012-02-25 LAB — GLUCOSE, CAPILLARY
Glucose-Capillary: 148 mg/dL — ABNORMAL HIGH (ref 70–99)
Glucose-Capillary: 207 mg/dL — ABNORMAL HIGH (ref 70–99)

## 2012-02-25 MED ORDER — BISACODYL 10 MG RE SUPP
10.0000 mg | Freq: Two times a day (BID) | RECTAL | Status: AC
  Administered 2012-02-25: 10 mg via RECTAL
  Filled 2012-02-25: qty 1

## 2012-02-25 NOTE — Progress Notes (Signed)
5 Days Post-Op  Subjective: Stable and alert. Voiding without difficulty. Beginning to ambulate a little more.  Tolerating clear liquids but not hungry. Passing flatus but no stool. Still feels bloated.  Objective: Vital signs in last 24 hours: Temp:  [98.6 F (37 C)-99.6 F (37.6 C)] 98.8 F (37.1 C) (07/06 0818) Pulse Rate:  [82-102] 82  (07/06 0818) Resp:  [17-19] 19  (07/06 0818) BP: (145-173)/(69-83) 165/83 mmHg (07/06 0818) SpO2:  [91 %-94 %] 91 % (07/06 0818) Weight:  [185 lb 8 oz (84.142 kg)] 185 lb 8 oz (84.142 kg) (07/05 2208) Last BM Date: 02/19/12  Intake/Output from previous day: 07/05 0701 - 07/06 0700 In: 790 [P.O.:790] Out: 540 [Urine:400; Drains:140] Intake/Output this shift: Total I/O In: 120 [P.O.:120] Out: 470 [Urine:400; Drains:70]  General appearance: alert. Mental status normal. Moving slowly. In no severe distress. Resp: clear to auscultation bilaterally GI: abdomen still distended and somewhat tympanitic. Bowel sounds hypoactive. Relatively soft. JP drainage serosanguineous.  Lab Results:  Results for orders placed during the hospital encounter of 02/20/12 (from the past 24 hour(s))  GLUCOSE, CAPILLARY     Status: Abnormal   Collection Time   02/24/12 11:53 AM      Component Value Range   Glucose-Capillary 148 (*) 70 - 99 mg/dL  GLUCOSE, CAPILLARY     Status: Abnormal   Collection Time   02/24/12  4:56 PM      Component Value Range   Glucose-Capillary 201 (*) 70 - 99 mg/dL  GLUCOSE, CAPILLARY     Status: Abnormal   Collection Time   02/24/12 10:05 PM      Component Value Range   Glucose-Capillary 133 (*) 70 - 99 mg/dL  GLUCOSE, CAPILLARY     Status: Abnormal   Collection Time   02/25/12  7:31 AM      Component Value Range   Glucose-Capillary 148 (*) 70 - 99 mg/dL   Comment 1 Documented in Chart     Comment 2 Notify RN       Studies/Results: @RISRSLT24 @     . amLODipine  10 mg Oral Daily   And  . atorvastatin  20 mg Oral q1800  .  antiseptic oral rinse  15 mL Mouth Rinse BID  . bethanechol  25 mg Oral TID  . ertapenem (INVANZ) IV  1 g Intravenous Q24H  . heparin  5,000 Units Subcutaneous Q8H  . insulin aspart  0-15 Units Subcutaneous TID WC  . insulin aspart  0-5 Units Subcutaneous QHS  . insulin aspart  4 Units Subcutaneous TID WC  . losartan  100 mg Oral Daily  . pantoprazole (PROTONIX) IV  40 mg Intravenous QHS  . potassium chloride SA  20 mEq Oral Daily     Assessment/Plan: s/p Procedure(s): APPENDECTOMY LAPAROSCOPIC  POD#5.   Ruptured appendicitis with peritonitis. Still has ileus. Will stay on liquid diet today Try dulcolax suppository. Continued Invanz.  History peptic ulcer disease. Continue proton pump inhibitors.    LOS: 5 days    Austin [name] M. Dalbert Batman, M.D., [facility] General and Minimally invasive Surgery Breast and Colorectal Surgery Office:   [phone] Pager:   [phone]  02/25/2012  . .prob

## 2012-02-26 LAB — GLUCOSE, CAPILLARY
Glucose-Capillary: 119 mg/dL — ABNORMAL HIGH (ref 70–99)
Glucose-Capillary: 158 mg/dL — ABNORMAL HIGH (ref 70–99)

## 2012-02-26 MED ORDER — PANTOPRAZOLE SODIUM 40 MG PO TBEC
40.0000 mg | DELAYED_RELEASE_TABLET | Freq: Every day | ORAL | Status: DC
  Administered 2012-02-26 – 2012-02-27 (×2): 40 mg via ORAL
  Filled 2012-02-26 (×3): qty 1

## 2012-02-26 NOTE — Progress Notes (Signed)
6 Days Post-Op  Subjective: Has started to have bowel movements now. Feels less distended. Tolerating clear liquids without cramps or nausea.  Objective: Vital signs in last 24 hours: Temp:  [98.3 F (36.8 C)-99.4 F (37.4 C)] 98.7 F (37.1 C) (07/07 0858) Pulse Rate:  [78-97] 97  (07/07 0858) Resp:  [18-20] 20  (07/07 0858) BP: (145-162)/(76-83) 150/77 mmHg (07/07 0858) SpO2:  [90 %-94 %] 91 % (07/07 0858) Weight:  [191 lb 9.6 oz (86.909 kg)] 191 lb 9.6 oz (86.909 kg) (07/06 2040) Last BM Date: 02/25/12  Intake/Output from previous day: 07/06 0701 - 07/07 0700 In: 1821.3 [P.O.:120; I.V.:1691.3; IV Piggyback:10] Out: [id number] [Urine:1075; Drains:160] Intake/Output this shift:    General appearance: aalert. Mental status normal. In no distress. GI: abdomen soft. Less distended. Bowel sounds present. Wounds looked fine. JP drain is still moderate volume, but serosanguineous. No enteric drainage.  Lab Results:  Results for orders placed during the hospital encounter of 02/20/12 (from the past 24 hour(s))  GLUCOSE, CAPILLARY     Status: Abnormal   Collection Time   02/25/12 12:04 PM      Component Value Range   Glucose-Capillary 207 (*) 70 - 99 mg/dL   Comment 1 Documented in Chart     Comment 2 Notify RN    GLUCOSE, CAPILLARY     Status: Normal   Collection Time   02/25/12  4:49 PM      Component Value Range   Glucose-Capillary 98  70 - 99 mg/dL   Comment 1 Documented in Chart     Comment 2 Notify RN    GLUCOSE, CAPILLARY     Status: Abnormal   Collection Time   02/25/12 10:18 PM      Component Value Range   Glucose-Capillary 139 (*) 70 - 99 mg/dL  GLUCOSE, CAPILLARY     Status: Abnormal   Collection Time   02/26/12  7:41 AM      Component Value Range   Glucose-Capillary 170 (*) 70 - 99 mg/dL   Comment 1 Documented in Chart     Comment 2 Notify RN       Studies/Results: @RISRSLT24 @     . amLODipine  10 mg Oral Daily   And  . atorvastatin  20 mg Oral q1800  .  antiseptic oral rinse  15 mL Mouth Rinse BID  . bethanechol  25 mg Oral TID  . bisacodyl  10 mg Rectal BID  . ertapenem (INVANZ) IV  1 g Intravenous Q24H  . heparin  5,000 Units Subcutaneous Q8H  . insulin aspart  0-15 Units Subcutaneous TID WC  . insulin aspart  0-5 Units Subcutaneous QHS  . losartan  100 mg Oral Daily  . pantoprazole (PROTONIX) IV  40 mg Intravenous QHS  . potassium chloride SA  20 mEq Oral Daily  . DISCONTD: insulin aspart  4 Units Subcutaneous TID WC     Assessment/Plan: s/p Procedure(s): APPENDECTOMY LAPAROSCOPIC  POD #6. Ruptured appendicitis with peritonitis. Ileus resolving. Advance to low-fat diet. Continue Invanz.  Check labs tomorrow  History peptic ulcer disease. Switch proton pump inhibitors to oral form.  Hopefully home in 24-48 hours.    LOS: 6 days    Adetokunbo Mccadden M. Dalbert Batman, M.D., [facility] General and Minimally invasive Surgery Breast and Colorectal Surgery Office:   [phone] Pager:   [phone]  02/26/2012  . .prob

## 2012-02-27 LAB — BASIC METABOLIC PANEL
BUN: 19 mg/dL (ref 6–23)
Chloride: 103 mEq/L (ref 96–112)
GFR calc Af Amer: 63 mL/min — ABNORMAL LOW (ref >90)
GFR calc non Af Amer: 55 mL/min — ABNORMAL LOW (ref >90)
Potassium: 3.8 mEq/L (ref 3.5–5.1)
Sodium: 136 mEq/L (ref 135–145)

## 2012-02-27 LAB — CULTURE, BLOOD (ROUTINE X 2)

## 2012-02-27 LAB — CBC
HCT: 36.1 % — ABNORMAL LOW (ref 39.0–52.0)
MCHC: 33.5 g/dL (ref 30.0–36.0)
RDW: 13.6 % (ref 11.5–15.5)
WBC: 11.1 10*3/uL — ABNORMAL HIGH (ref 4.0–10.5)

## 2012-02-27 LAB — GLUCOSE, CAPILLARY
Glucose-Capillary: 155 mg/dL — ABNORMAL HIGH (ref 70–99)
Glucose-Capillary: 123 mg/dL — ABNORMAL HIGH (ref 70–99)

## 2012-02-27 NOTE — Clinical Social Work Psychosocial (Signed)
     Clinical Social Work Department BRIEF PSYCHOSOCIAL ASSESSMENT 02/27/2012  Patient:  Austin [name],Austin [name]     Account Number:  [id number]     Admit date:  02/20/2012  Clinical Social Worker:  [employer]  Date/Time:  02/27/2012 12:18 PM  Referred by:  RN  Date Referred:  02/27/2012 Referred for  Advanced Directives   Other Referral:   Interview type:  Patient Other interview type:   and pt wife    PSYCHOSOCIAL DATA Living Status:  FAMILY Admitted from facility:   Level of care:   Primary support name:  Austin [name] Primary support relationship to patient:  SPOUSE Degree of support available:   strong    CURRENT CONCERNS Current Concerns  Other - See comment   Other Concerns:   advanced directives    SOCIAL WORK ASSESSMENT / PLAN CSW recieved referral for advanced directives. Pt stated he did not recall asking for advanced directives however was interested in learning about it now that csw brought the advanced directive. CSW and pt discussed role of HCPOA and Living Will. Pt stated he would like to look over and think about the advanced directives. Pt verbalized understanding of completing advanced directives. Pt agreed to contact staff if interested in notarizing and completing while in hospital. Pt also agreed to follow up with community notary when patient was ready to complete if discharged home before patient was ready to complete.   Assessment/plan status:  No Further Intervention Required Other assessment/ plan:   Information/referral to community resources:   Advanced Directives    PATIENTS/FAMILYS RESPONSE TO PLAN OF CARE: Pt and pt spouse thanked csw for concern and education on advanced directives. pt plans to notify nursing staff if ready to complete during this hosptialization or pt will plan to follow up with community notary.

## 2012-02-27 NOTE — Progress Notes (Signed)
Patient ID: Austin [name], male   DOB: 10-11-1934, 77 y.o.   MRN: [id number] 7 Days Post-Op  Subjective: No complaints today, just needing to start mobilizing more, denies v/n.  Abd continunes to feel better.  Objective: Vital signs in last 24 hours: Temp:  [98.5 F (36.9 C)-99.4 F (37.4 C)] 99.2 F (37.3 C) (07/08 0503) Pulse Rate:  [67-97] 81  (07/08 0503) Resp:  [18-20] 18  (07/08 0503) BP: (140-157)/(72-82) 140/77 mmHg (07/08 0503) SpO2:  [91 %-97 %] 97 % (07/08 0503) Weight:  [191 lb 12.8 oz (87 kg)] 191 lb 12.8 oz (87 kg) (07/07 2156) Last BM Date: 02/26/12  Intake/Output from previous day: 07/07 0701 - 07/08 0700 In: 1412.1 [I.V.:1362.1; IV Piggyback:50] Out: [id number] [Urine:1525; Drains:100] Intake/Output this shift:    General appearance: WD, WN, no acute distress. GI: abdomen soft. +Bowel sounds, incisions c/d/i. JP drain is still moderate volume, but serosanguineous. No enteric drainage.  Lab Results:  Results for orders placed during the hospital encounter of 02/20/12 (from the past 24 hour(s))  GLUCOSE, CAPILLARY     Status: Abnormal   Collection Time   02/26/12  7:41 AM      Component Value Range   Glucose-Capillary 170 (*) 70 - 99 mg/dL   Comment 1 Documented in Chart     Comment 2 Notify RN    GLUCOSE, CAPILLARY     Status: Abnormal   Collection Time   02/26/12 12:04 PM      Component Value Range   Glucose-Capillary 111 (*) 70 - 99 mg/dL   Comment 1 Documented in Chart     Comment 2 Notify RN    GLUCOSE, CAPILLARY     Status: Abnormal   Collection Time   02/26/12  5:13 PM      Component Value Range   Glucose-Capillary 119 (*) 70 - 99 mg/dL   Comment 1 Documented in Chart     Comment 2 Notify RN    GLUCOSE, CAPILLARY     Status: Abnormal   Collection Time   02/26/12  9:50 PM      Component Value Range   Glucose-Capillary 158 (*) 70 - 99 mg/dL  CBC     Status: Abnormal   Collection Time   02/27/12  6:22 AM      Component Value Range   WBC 11.1 (*) 4.0 - 10.5  K/uL   RBC 4.52  4.22 - 5.81 MIL/uL   Hemoglobin 12.1 (*) 13.0 - 17.0 g/dL   HCT 36.1 (*) 39.0 - 52.0 %   MCV 79.9  78.0 - 100.0 fL   MCH 26.8  26.0 - 34.0 pg   MCHC 33.5  30.0 - 36.0 g/dL   RDW 13.6  11.5 - 15.5 %   Platelets 376  150 - 400 K/uL  BASIC METABOLIC PANEL     Status: Abnormal   Collection Time   02/27/12  6:22 AM      Component Value Range   Sodium 136  135 - 145 mEq/L   Potassium 3.8  3.5 - 5.1 mEq/L   Chloride 103  96 - 112 mEq/L   CO2 25  19 - 32 mEq/L   Glucose, Bld 148 (*) 70 - 99 mg/dL   BUN 19  6 - 23 mg/dL   Creatinine, Ser 1.25  0.50 - 1.35 mg/dL   Calcium 9.1  8.4 - 10.5 mg/dL   GFR calc non Af Amer 55 (*) >90 mL/min   GFR calc Af Wyvonnia Lora  63 (*) >90 mL/min     Studies/Results: @RISRSLT24 @     . amLODipine  10 mg Oral Daily   And  . atorvastatin  20 mg Oral q1800  . antiseptic oral rinse  15 mL Mouth Rinse BID  . bethanechol  25 mg Oral TID  . bisacodyl  10 mg Rectal BID  . ertapenem (INVANZ) IV  1 g Intravenous Q24H  . heparin  5,000 Units Subcutaneous Q8H  . insulin aspart  0-15 Units Subcutaneous TID WC  . insulin aspart  0-5 Units Subcutaneous QHS  . losartan  100 mg Oral Daily  . pantoprazole  40 mg Oral Q1200  . potassium chloride SA  20 mEq Oral Daily  . DISCONTD: pantoprazole (PROTONIX) IV  40 mg Intravenous QHS     Assessment/Plan: s/p Procedure(s): APPENDECTOMY LAPAROSCOPIC  POD #8. Ruptured appendicitis with peritonitis. Ileus resolving. Tolerating low-fat diet.  WBC still trending down, Continue Invanz. Likely home tomorrow, ?with drain.    LOS: 7 days    Latanya Hemmer, [facility] Office:   [phone]   02/27/2012  . .prob

## 2012-02-27 NOTE — Progress Notes (Signed)
I have seen and examined the patient and agree with the assessment and plans.  Clay Solum A. Antavion Bartoszek  MD, FACS  

## 2012-02-28 LAB — GLUCOSE, CAPILLARY
Glucose-Capillary: 122 mg/dL — ABNORMAL HIGH (ref 70–99)
Glucose-Capillary: 170 mg/dL — ABNORMAL HIGH (ref 70–99)

## 2012-02-28 MED ORDER — HYDROCODONE-ACETAMINOPHEN 5-325 MG PO TABS: 1.0000 | ORAL_TABLET | ORAL | Status: AC | PRN

## 2012-02-28 NOTE — Progress Notes (Signed)
I have seen and examined the patient and agree with the assessment and plans.  Anieya Helman A. Cashlyn Huguley  MD, FACS  

## 2012-02-28 NOTE — Discharge Summary (Signed)
Physician Discharge Summary  Patient ID: Austin [name] MRN: [id number] DOB/AGE: [birth year] 77 y.o.  Admit date: 02/20/2012 Discharge date: 02/28/2012  Admission Diagnoses:  Discharge Diagnoses:  Principal Problem:  *Acute appendicitis with localized peritonitis   Discharged Condition: stable  Hospital Course: Pt presented with 2 days of worsening RLQ abdominal pain. He had had mild nausea, but no vomiting. He had a fever with chills on the day of presentation but today, but not the day previously. He has not had an appetite for the duration either. He has had diarrhea on and off for several months, but this had not been a problem in the last 24 hours. He is on plavix for cerebrovascular disease. He did not take that prior to [facility] today or yesterday. He denied chest pain or shortness of breath. He was not feeling palpitations, but had been having ectopy on the monitor. Noted to have acute appendicitis with questionable perforation and was noted to have markedly elevated WBCs. CT scan confirmed acute appendicitis with perforation, given these findings he was taken to the operating room for an emergent appendectomy.   Consults: None  Significant Diagnostic Studies: labs, microbiology and radiology.  Treatments: IV hydration, antibiotics, analgesia, respiratory therapy, and surgery.  Discharge Exam: Blood pressure 167/67, pulse 93, temperature 98.9 F (37.2 C), temperature source Oral, resp. rate 19, height 5\' 7"  (1.702 m), weight 188 lb 7.9 oz (85.5 kg), SpO2 98.00%. General appearance: alert, cooperative and no distress Abdomen: JP drain in place draining serosanguinous fluid, abdomen is soft, non-tender, + BS, +BM, no bloating. All surgical wounds are well approximated without erythema or drainage. Chest: CTA Extermities: without edema  Disposition: Discharge to self care-Home.  Follow up with Dr. Barry Dienes in 2-3 weeks.  Discharge Orders    Future Appointments: Provider: Department:  Dept Phone: Center:   04/10/2012 10:00 AM Marletta Lor, MD [facility] [phone] [facility]   06/14/2012 3:00 PM [facility] [phone] [facility]     Medication List  As of 02/28/2012  1:20 PM   ASK your doctor about these medications         amlodipine-atorvastatin 10-20 MG per tablet   Commonly known as: CADUET   Take 1 tablet by mouth daily.      aspirin 81 MG tablet   Take 81 mg by mouth daily.      clopidogrel 75 MG tablet   Commonly known as: PLAVIX   Take 75 mg by mouth daily.      diphenoxylate-atropine 2.5-0.025 MG per tablet   Commonly known as: LOMOTIL   Take 1 tablet by mouth 4 (four) times daily as needed.      esomeprazole 40 MG capsule   Commonly known as: NEXIUM   Take 40 mg by mouth daily before breakfast.      glipiZIDE 5 MG 24 hr tablet   Commonly known as: GLUCOTROL XL   Take 5 mg by mouth daily.      glucose blood test strip   1 each by Other route daily as needed.      hydrocortisone-pramoxine 2.5-1 % rectal cream   Commonly known as: ANALPRAM-HC   Place 1 application rectally 3 (three) times daily.      losartan 100 MG tablet   Commonly known as: COZAAR   Take 100 mg by mouth daily.      potassium chloride SA 20 MEQ tablet   Commonly known as: K-DUR,KLOR-CON   Take 20 mEq by mouth daily.  SignedRobinette Haines 02/28/2012, 1:20 PM

## 2012-02-28 NOTE — Progress Notes (Signed)
Patient ID: Austin [name], male   DOB: 03/17/1935, 77 y.o.   MRN: [id number] Patient ID: Austin [name], male   DOB: 1934/10/22, 77 y.o.   MRN: [id number] 8 Days Post-Op  Subjective: No complaints today, denies v/n.  Abd continunes to feel better.  Objective: Vital signs in last 24 hours: Temp:  [98.8 F (37.1 C)-99.6 F (37.6 C)] 98.9 F (37.2 C) (07/09 0919) Pulse Rate:  [77-93] 93  (07/09 0919) Resp:  [18-19] 19  (07/09 0919) BP: (145-167)/(67-76) 167/67 mmHg (07/09 0919) SpO2:  [95 %-98 %] 98 % (07/09 0919) Weight:  [188 lb 7.9 oz (85.5 kg)] 188 lb 7.9 oz (85.5 kg) (07/08 2020) Last BM Date: 02/27/12  Intake/Output from previous day: 07/08 0701 - 07/09 0700 In: 1211.7 [I.V.:1161.7; IV Piggyback:50] Out: [id number] [Urine:350; Drains:125] Intake/Output this shift: Total I/O In: 240 [P.O.:240] Out: 300 [Urine:300]  General appearance: WD, WN, no acute distress. GI: abdomen soft. +Bowel sounds, incisions c/d/i. JP drain (125 ml past 24 hours)  serosanguineous. No enteric drainage.  Lab Results:  Results for orders placed during the hospital encounter of 02/20/12 (from the past 24 hour(s))  GLUCOSE, CAPILLARY     Status: Abnormal   Collection Time   02/27/12 11:47 AM      Component Value Range   Glucose-Capillary 127 (*) 70 - 99 mg/dL   Comment 1 Documented in Chart     Comment 2 Notify RN    GLUCOSE, CAPILLARY     Status: Abnormal   Collection Time   02/27/12  5:00 PM      Component Value Range   Glucose-Capillary 155 (*) 70 - 99 mg/dL   Comment 1 Documented in Chart     Comment 2 Notify RN    GLUCOSE, CAPILLARY     Status: Abnormal   Collection Time   02/27/12  9:08 PM      Component Value Range   Glucose-Capillary 123 (*) 70 - 99 mg/dL  GLUCOSE, CAPILLARY     Status: Abnormal   Collection Time   02/28/12  7:35 AM      Component Value Range   Glucose-Capillary 122 (*) 70 - 99 mg/dL     Studies/Results: @RISRSLT24 @     . amLODipine  10 mg Oral Daily   And  . atorvastatin  20  mg Oral q1800  . antiseptic oral rinse  15 mL Mouth Rinse BID  . bethanechol  25 mg Oral TID  . ertapenem (INVANZ) IV  1 g Intravenous Q24H  . heparin  5,000 Units Subcutaneous Q8H  . insulin aspart  0-15 Units Subcutaneous TID WC  . insulin aspart  0-5 Units Subcutaneous QHS  . losartan  100 mg Oral Daily  . pantoprazole  40 mg Oral Q1200  . potassium chloride SA  20 mEq Oral Daily     Assessment/Plan: s/p Procedure(s): APPENDECTOMY LAPAROSCOPIC  POD #8. Ruptured appendicitis with peritonitis. Ileus resolving. Tolerating low-fat diet.  WBC still trending down, afebrile, Continue Invanz if not discharged later today. Hypertension still not well controlled will need f/u with his PCP for this. Probable discharge to home; ? Drain removal prior to discharge.   LOS: 8 days    [facility] Office:   [phone]   02/28/2012  . .prob

## 2012-03-16 ENCOUNTER — Ambulatory Visit (INDEPENDENT_AMBULATORY_CARE_PROVIDER_SITE_OTHER): Payer: Medicare Other | Admitting: General Surgery

## 2012-03-16 ENCOUNTER — Encounter (INDEPENDENT_AMBULATORY_CARE_PROVIDER_SITE_OTHER): Payer: Self-pay | Admitting: General Surgery

## 2012-03-16 VITALS — BP 142/68 | HR 64 | Temp 97.6°F | Resp 18 | Ht 68.0 in | Wt 179.6 lb

## 2012-03-16 DIAGNOSIS — K3533 Acute appendicitis with perforation and localized peritonitis, with abscess: Secondary | ICD-10-CM

## 2012-03-16 DIAGNOSIS — K353 Acute appendicitis with localized peritonitis, without perforation or gangrene: Secondary | ICD-10-CM

## 2012-03-16 NOTE — Patient Instructions (Signed)
OK to resume normal activities  Follow up as needed.

## 2012-03-16 NOTE — Progress Notes (Signed)
HISTORY: Pt doing well after appendectomy.  Drain was removed while he was an inpatient.  He has restarted his plavix.  He denies fevers/ chills/ nausea/vomiting.  He has had a little constipation.  He is not on narcotics any longer.      EXAM: General:  Alert, well groomed. Incision:  Well healed   PATHOLOGY: Acute supporative appendicitis.   ASSESSMENT AND PLAN:   Acute appendicitis with localized peritonitis Doing well post op  Follow up as needed.  No evidence of complications.      Milus Height, MD Surgical Oncology, [facility]  Nyoka Cowden, MD Marletta Lor, MD

## 2012-03-16 NOTE — Assessment & Plan Note (Signed)
Doing well post op  Follow up as needed.  No evidence of complications.

## 2012-03-19 ENCOUNTER — Other Ambulatory Visit: Payer: Self-pay | Admitting: Internal Medicine

## 2012-04-10 ENCOUNTER — Encounter: Payer: Self-pay | Admitting: Internal Medicine

## 2012-04-10 ENCOUNTER — Ambulatory Visit (INDEPENDENT_AMBULATORY_CARE_PROVIDER_SITE_OTHER): Payer: Medicare Other | Admitting: Internal Medicine

## 2012-04-10 VITALS — BP 110/70 | Temp 98.5°F | Wt 185.0 lb

## 2012-04-10 DIAGNOSIS — N289 Disorder of kidney and ureter, unspecified: Secondary | ICD-10-CM

## 2012-04-10 DIAGNOSIS — I10 Essential (primary) hypertension: Secondary | ICD-10-CM

## 2012-04-10 DIAGNOSIS — E119 Type 2 diabetes mellitus without complications: Secondary | ICD-10-CM

## 2012-04-10 NOTE — Patient Instructions (Signed)
Limit your sodium (Salt) intake   Please check your hemoglobin A1c every 3 months    It is important that you exercise regularly, at least 20 minutes 3 to 4 times per week.  If you develop chest pain or shortness of breath seek  medical attention.   

## 2012-04-10 NOTE — Progress Notes (Signed)
Subjective:    Patient ID: Austin [name], male    DOB: 23-Jun-1935, 77 y.o.   MRN: [id number]  HPI  76 year old patient who is seen today for followup of his type 2 diabetes. He is 6 weeks status post appendectomy and doing quite well. He is treated hypertension chronic kidney disease. No concerns or complaints. His hemoglobin A1c approximately 6 weeks ago in the hospital was 6.7. He continues to have nice glycemic control  Past Medical History  Diagnosis Date  . 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 CAROTID ARTERY STENOSIS 05/10/2010  . DIABETES MELLITUS, TYPE II 09/19/2007  . DISEASE, CEREBROVASCULAR NEC 03/05/2007  . GERD 03/13/2007  . HYPERLIPIDEMIA 03/05/2007  . HYPERTENSION 03/05/2007  . HYPOKALEMIA 11/09/2009  . KNEE PAIN, RIGHT 11/09/2009  . PROSTATE CANCER, HX OF 03/05/2007  . RENAL DISEASE, CHRONIC 02/03/2009    History   Social History  . Marital Status: Married    Spouse Name: N/A    Number of Children: N/A  . Years of Education: N/A   Occupational History  . Not on file.   Social History Main Topics  . Smoking status: Former Smoker    Quit date: 08/22/1974  . Smokeless tobacco: Never Used  . Alcohol Use: No  . Drug Use: No  . Sexually Active: Not on file   Other Topics Concern  . Not on file   Social History Narrative  . No narrative on file    Past Surgical History  Procedure Date  . Carotid artery angioplasty   . Prostate surgery     prostatectomy  . Laparoscopic appendectomy 02/20/2012    Procedure: APPENDECTOMY LAPAROSCOPIC;  Surgeon: Stark Klein, MD;  Location: [facility];  Service: General;  Laterality: N/A;    No family history on file.  Allergies  Allergen Reactions  . Aspirin Other (See Comments)    High doses causes stomach ulcer and bleeding    Current Outpatient Prescriptions on File Prior to Visit  Medication Sig Dispense Refill  . amlodipine-atorvastatin (CADUET) 10-20 MG per tablet Take 1 tablet by mouth daily.      Marland Kitchen aspirin 81 MG  tablet Take 81 mg by mouth daily.        . clopidogrel (PLAVIX) 75 MG tablet Take 75 mg by mouth daily.      . clopidogrel (PLAVIX) 75 MG tablet TAKE 1 TABLET EVERY DAY  100 tablet  1  . diphenoxylate-atropine (LOMOTIL) 2.5-0.025 MG per tablet Take 1 tablet by mouth 4 (four) times daily as needed.      Marland Kitchen esomeprazole (NEXIUM) 40 MG capsule Take 40 mg by mouth daily before breakfast.      . glipiZIDE (GLUCOTROL XL) 5 MG 24 hr tablet Take 5 mg by mouth daily.      Marland Kitchen glucose blood test strip 1 each by Other route daily as needed.      . hydrocortisone-pramoxine (ANALPRAM-HC) 2.5-1 % rectal cream Place 1 application rectally 3 (three) times daily.      Marland Kitchen losartan (COZAAR) 100 MG tablet Take 100 mg by mouth daily.      . potassium chloride SA (K-DUR,KLOR-CON) 20 MEQ tablet Take 20 mEq by mouth daily.        BP 110/70  Temp 98.5 F (36.9 C) (Oral)  Wt 185 lb (83.915 kg)      Review of Systems  Constitutional: Negative for fever, chills, appetite change and fatigue.  HENT: Negative for hearing loss, ear pain, congestion, sore throat, trouble  swallowing, neck stiffness, dental problem, voice change and tinnitus.   Eyes: Negative for pain, discharge and visual disturbance.  Respiratory: Negative for cough, chest tightness, wheezing and stridor.   Cardiovascular: Negative for chest pain, palpitations and leg swelling.  Gastrointestinal: Negative for nausea, vomiting, abdominal pain, diarrhea, constipation, blood in stool and abdominal distention.  Genitourinary: Negative for urgency, hematuria, flank pain, discharge, difficulty urinating and genital sores.  Musculoskeletal: Negative for myalgias, back pain, joint swelling, arthralgias and gait problem.  Skin: Negative for rash.  Neurological: Negative for dizziness, syncope, speech difficulty, weakness, numbness and headaches.  Hematological: Negative for adenopathy. Does not bruise/bleed easily.  Psychiatric/Behavioral: Negative for  behavioral problems and dysphoric mood. The patient is not nervous/anxious.        Objective:   Physical Exam  Constitutional: He is oriented to person, place, and time. He appears well-developed.  HENT:  Head: Normocephalic.  Right Ear: External ear normal.  Left Ear: External ear normal.  Eyes: Conjunctivae and EOM are normal.  Neck: Normal range of motion.  Cardiovascular: Normal rate and normal heart sounds.   Pulmonary/Chest: Breath sounds normal.  Abdominal: Bowel sounds are normal.  Musculoskeletal: Normal range of motion. He exhibits no edema and no tenderness.  Neurological: He is alert and oriented to person, place, and time.  Psychiatric: He has a normal mood and affect. His behavior is normal.          Assessment & Plan:   DM2-  Well controlled HTN- stabkle  CKD  Recheck 3 months No change therapy

## 2012-04-27 ENCOUNTER — Other Ambulatory Visit: Payer: Self-pay | Admitting: Internal Medicine

## 2012-05-16 ENCOUNTER — Other Ambulatory Visit: Payer: Self-pay | Admitting: Internal Medicine

## 2012-05-30 ENCOUNTER — Ambulatory Visit (INDEPENDENT_AMBULATORY_CARE_PROVIDER_SITE_OTHER): Payer: Medicare Other | Admitting: Internal Medicine

## 2012-05-30 DIAGNOSIS — Z23 Encounter for immunization: Secondary | ICD-10-CM

## 2012-06-07 ENCOUNTER — Other Ambulatory Visit: Payer: Self-pay | Admitting: *Deleted

## 2012-06-07 DIAGNOSIS — I6529 Occlusion and stenosis of unspecified carotid artery: Secondary | ICD-10-CM

## 2012-06-07 DIAGNOSIS — Z48812 Encounter for surgical aftercare following surgery on the circulatory system: Secondary | ICD-10-CM

## 2012-06-14 ENCOUNTER — Other Ambulatory Visit: Payer: Self-pay

## 2012-07-11 ENCOUNTER — Encounter: Payer: Self-pay | Admitting: Internal Medicine

## 2012-07-11 ENCOUNTER — Ambulatory Visit (INDEPENDENT_AMBULATORY_CARE_PROVIDER_SITE_OTHER): Payer: Medicare Other | Admitting: Internal Medicine

## 2012-07-11 VITALS — BP 142/74 | HR 64 | Temp 98.3°F | Resp 16 | Wt 188.0 lb

## 2012-07-11 DIAGNOSIS — E785 Hyperlipidemia, unspecified: Secondary | ICD-10-CM

## 2012-07-11 DIAGNOSIS — E876 Hypokalemia: Secondary | ICD-10-CM

## 2012-07-11 DIAGNOSIS — I10 Essential (primary) hypertension: Secondary | ICD-10-CM

## 2012-07-11 DIAGNOSIS — I6529 Occlusion and stenosis of unspecified carotid artery: Secondary | ICD-10-CM

## 2012-07-11 DIAGNOSIS — E119 Type 2 diabetes mellitus without complications: Secondary | ICD-10-CM

## 2012-07-11 DIAGNOSIS — N289 Disorder of kidney and ureter, unspecified: Secondary | ICD-10-CM

## 2012-07-11 LAB — BASIC METABOLIC PANEL
BUN: 19 mg/dL (ref 6–23)
Calcium: 9.3 mg/dL (ref 8.4–10.5)
GFR: 69.68 mL/min (ref >60.00)
Glucose, Bld: 112 mg/dL — ABNORMAL HIGH (ref 70–99)
Sodium: 141 mEq/L (ref 135–145)

## 2012-07-11 NOTE — Patient Instructions (Signed)
Limit your sodium (Salt) intake    It is important that you exercise regularly, at least 20 minutes 3 to 4 times per week.  If you develop chest pain or shortness of breath seek  medical attention.  Carotid artery duplex study   Please check your hemoglobin A1c every 3 months

## 2012-07-11 NOTE — Progress Notes (Signed)
Subjective:    Patient ID: Austin [name], male    DOB: Aug 05, 1935, 77 y.o.   MRN: [id number]  HPI  77 year old patient who is seen today for followup. He has a history of type 2 diabetes he has chronic kidney disease carotid artery stenosis and is scheduled for followup carotid artery duplex evaluation. His blood pressure has been well-controlled. He was hospitalized in July for acute appendicitis. In general doing quite well today. Hemoglobin A1c is have been well-controlled. He does monitor home blood sugar readings with nice results. No new concerns or complaints. Denies any cardiopulmonary or any focal neurological complaints.  Past Medical History  Diagnosis Date  . 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 CAROTID ARTERY STENOSIS 05/10/2010  . DIABETES MELLITUS, TYPE II 09/19/2007  . DISEASE, CEREBROVASCULAR NEC 03/05/2007  . GERD 03/13/2007  . HYPERLIPIDEMIA 03/05/2007  . HYPERTENSION 03/05/2007  . HYPOKALEMIA 11/09/2009  . KNEE PAIN, RIGHT 11/09/2009  . PROSTATE CANCER, HX OF 03/05/2007  . RENAL DISEASE, CHRONIC 02/03/2009    History   Social History  . Marital Status: Married    Spouse Name: N/A    Number of Children: N/A  . Years of Education: N/A   Occupational History  . Not on file.   Social History Main Topics  . Smoking status: Former Smoker    Quit date: 08/22/1974  . Smokeless tobacco: Never Used  . Alcohol Use: No  . Drug Use: No  . Sexually Active: Not on file   Other Topics Concern  . Not on file   Social History Narrative  . No narrative on file    Past Surgical History  Procedure Date  . Carotid artery angioplasty   . Prostate surgery     prostatectomy  . Laparoscopic appendectomy 02/20/2012    Procedure: APPENDECTOMY LAPAROSCOPIC;  Surgeon: Stark Klein, MD;  Location: [place] [facility];  Service: General;  Laterality: N/A;    No family history on file.  Allergies  Allergen Reactions  . Aspirin Other (See Comments)    High doses causes stomach ulcer and  bleeding    Current Outpatient Prescriptions on File Prior to Visit  Medication Sig Dispense Refill  . amlodipine-atorvastatin (CADUET) 10-20 MG per tablet Take 1 tablet by mouth daily.      Marland Kitchen aspirin 81 MG tablet Take 81 mg by mouth daily.        . clopidogrel (PLAVIX) 75 MG tablet Take 75 mg by mouth daily.      . clopidogrel (PLAVIX) 75 MG tablet TAKE 1 TABLET EVERY DAY  100 tablet  1  . diphenoxylate-atropine (LOMOTIL) 2.5-0.025 MG per tablet Take 1 tablet by mouth 4 (four) times daily as needed.      Marland Kitchen esomeprazole (NEXIUM) 40 MG capsule Take 40 mg by mouth daily before breakfast.      . glipiZIDE (GLUCOTROL XL) 5 MG 24 hr tablet Take 5 mg by mouth daily.      Marland Kitchen glucose blood test strip 1 each by Other route daily as needed.      . hydrocortisone-pramoxine (ANALPRAM-HC) 2.5-1 % rectal cream Place 1 application rectally 3 (three) times daily.      Marland Kitchen KLOR-CON M20 20 MEQ tablet TAKE 1 TABLET EVERY DAY  100 tablet  1  . losartan (COZAAR) 100 MG tablet Take 100 mg by mouth daily.      Marland Kitchen NEXIUM 40 MG capsule TAKE ONE CAPSULE EVERY DAY  90 capsule  3  . potassium chloride SA (K-DUR,KLOR-CON)  20 MEQ tablet Take 20 mEq by mouth daily.        BP 142/74  Pulse 64  Temp 98.3 F (36.8 C) (Oral)  Resp 16  Wt 188 lb (85.276 kg)      Review of Systems  Constitutional: Negative for fever, chills, appetite change and fatigue.  HENT: Negative for hearing loss, ear pain, congestion, sore throat, trouble swallowing, neck stiffness, dental problem, voice change and tinnitus.   Eyes: Negative for pain, discharge and visual disturbance.  Respiratory: Negative for cough, chest tightness, wheezing and stridor.   Cardiovascular: Negative for chest pain, palpitations and leg swelling.  Gastrointestinal: Negative for nausea, vomiting, abdominal pain, diarrhea, constipation, blood in stool and abdominal distention.  Genitourinary: Negative for urgency, hematuria, flank pain, discharge, difficulty  urinating and genital sores.  Musculoskeletal: Negative for myalgias, back pain, joint swelling, arthralgias and gait problem.  Skin: Negative for rash.  Neurological: Negative for dizziness, syncope, speech difficulty, weakness, numbness and headaches.  Hematological: Negative for adenopathy. Does not bruise/bleed easily.  Psychiatric/Behavioral: Negative for behavioral problems and dysphoric mood. The patient is not nervous/anxious.        Objective:   Physical Exam  Constitutional: He is oriented to person, place, and time. He appears well-developed.  HENT:  Head: Normocephalic.  Right Ear: External ear normal.  Left Ear: External ear normal.  Eyes: Conjunctivae normal and EOM are normal.  Neck: Normal range of motion.  Cardiovascular: Normal rate and normal heart sounds.   Pulmonary/Chest: Breath sounds normal.  Abdominal: Bowel sounds are normal.  Musculoskeletal: Normal range of motion. He exhibits no edema and no tenderness.  Neurological: He is alert and oriented to person, place, and time.  Psychiatric: He has a normal mood and affect. His behavior is normal.          Assessment & Plan:   DM2   Will  Check a HGHa1c CKD-  F/u BMet HTN-stable CAS f/u duplex

## 2012-07-21 ENCOUNTER — Other Ambulatory Visit: Payer: Self-pay | Admitting: Internal Medicine

## 2012-09-06 ENCOUNTER — Other Ambulatory Visit: Payer: Self-pay | Admitting: Internal Medicine

## 2012-10-11 ENCOUNTER — Ambulatory Visit (INDEPENDENT_AMBULATORY_CARE_PROVIDER_SITE_OTHER): Payer: PRIVATE HEALTH INSURANCE | Admitting: Internal Medicine

## 2012-10-11 ENCOUNTER — Encounter: Payer: Self-pay | Admitting: Internal Medicine

## 2012-10-11 VITALS — BP 156/80 | HR 76 | Temp 98.5°F | Resp 18 | Wt 190.0 lb

## 2012-10-11 DIAGNOSIS — N289 Disorder of kidney and ureter, unspecified: Secondary | ICD-10-CM

## 2012-10-11 DIAGNOSIS — I10 Essential (primary) hypertension: Secondary | ICD-10-CM

## 2012-10-11 DIAGNOSIS — I6789 Other cerebrovascular disease: Secondary | ICD-10-CM

## 2012-10-11 DIAGNOSIS — E119 Type 2 diabetes mellitus without complications: Secondary | ICD-10-CM

## 2012-10-11 DIAGNOSIS — E785 Hyperlipidemia, unspecified: Secondary | ICD-10-CM

## 2012-10-11 LAB — HEMOGLOBIN A1C: Hgb A1c MFr Bld: 6.8 % — ABNORMAL HIGH (ref 4.6–6.5)

## 2012-10-11 NOTE — Progress Notes (Signed)
Subjective:    Patient ID: Austin [name], male    DOB: Mar 14, 1935, 78 y.o.   MRN: [id number]  HPI  78 year old patient who is seen today for followup. Medical problems include type 2 diabetes he has dyslipidemia hypertension and carotid artery disease. History we'll vascular disease as well as chronic kidney disease. He is doing quite well. No new concerns or complaints. Denies any cardiac or focal neurological symptoms.  Wt Readings from Last 3 Encounters:  10/11/12 190 lb (86.183 kg)  07/11/12 188 lb (85.276 kg)  04/10/12 185 lb (83.915 kg)    BP Readings from Last 3 Encounters:  10/11/12 156/80  07/11/12 142/74  04/10/12 110/70    Past Medical History  Diagnosis Date  . 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 CAROTID ARTERY STENOSIS 05/10/2010  . DIABETES MELLITUS, TYPE II 09/19/2007  . DISEASE, CEREBROVASCULAR NEC 03/05/2007  . GERD 03/13/2007  . HYPERLIPIDEMIA 03/05/2007  . HYPERTENSION 03/05/2007  . HYPOKALEMIA 11/09/2009  . KNEE PAIN, RIGHT 11/09/2009  . PROSTATE CANCER, HX OF 03/05/2007  . RENAL DISEASE, CHRONIC 02/03/2009    History   Social History  . Marital Status: Married    Spouse Name: N/A    Number of Children: N/A  . Years of Education: N/A   Occupational History  . Not on file.   Social History Main Topics  . Smoking status: Former Smoker    Quit date: 08/22/1974  . Smokeless tobacco: Never Used  . Alcohol Use: No  . Drug Use: No  . Sexually Active: Not on file   Other Topics Concern  . Not on file   Social History Narrative  . No narrative on file    Past Surgical History  Procedure Laterality Date  . Carotid artery angioplasty    . Prostate surgery      prostatectomy  . Laparoscopic appendectomy  02/20/2012    Procedure: APPENDECTOMY LAPAROSCOPIC;  Surgeon: Stark Klein, MD;  [facility];  Service: General;  Laterality: N/A;    No family history on file.  Allergies  Allergen Reactions  . Aspirin Other (See Comments)    High doses  causes stomach ulcer and bleeding    Current Outpatient Prescriptions on File Prior to Visit  Medication Sig Dispense Refill  . amlodipine-atorvastatin (CADUET) 10-20 MG per tablet Take 1 tablet by mouth daily.      Marland Kitchen aspirin 81 MG tablet Take 81 mg by mouth daily.        . clopidogrel (PLAVIX) 75 MG tablet TAKE 1 TABLET EVERY DAY  100 tablet  1  . diphenoxylate-atropine (LOMOTIL) 2.5-0.025 MG per tablet Take 1 tablet by mouth 4 (four) times daily as needed.      Marland Kitchen esomeprazole (NEXIUM) 40 MG capsule Take 40 mg by mouth daily before breakfast.      . glipiZIDE (GLUCOTROL XL) 5 MG 24 hr tablet Take 5 mg by mouth daily.      Marland Kitchen glucose blood test strip 1 each by Other route daily as needed.      . hydrocortisone-pramoxine (ANALPRAM-HC) 2.5-1 % rectal cream USE AS DIRECTED  28.35 g  1  . KLOR-CON M20 20 MEQ tablet TAKE 1 TABLET EVERY DAY  100 tablet  1  . losartan (COZAAR) 100 MG tablet Take 100 mg by mouth daily.      Marland Kitchen NEXIUM 40 MG capsule TAKE ONE CAPSULE EVERY DAY  90 capsule  3   No current facility-administered medications on file prior to visit.  BP 156/80  Pulse 76  Temp(Src) 98.5 F (36.9 C) (Oral)  Resp 18  Wt 190 lb (86.183 kg)  BMI 28.9 kg/m2  SpO2 97%     Review of Systems  Constitutional: Negative for fever, chills, appetite change and fatigue.  HENT: Negative for hearing loss, ear pain, congestion, sore throat, trouble swallowing, neck stiffness, dental problem, voice change and tinnitus.   Eyes: Negative for pain, discharge and visual disturbance.  Respiratory: Negative for cough, chest tightness, wheezing and stridor.   Cardiovascular: Negative for chest pain, palpitations and leg swelling.  Gastrointestinal: Negative for nausea, vomiting, abdominal pain, diarrhea, constipation, blood in stool and abdominal distention.  Genitourinary: Negative for urgency, hematuria, flank pain, discharge, difficulty urinating and genital sores.  Musculoskeletal: Negative for  myalgias, back pain, joint swelling, arthralgias and gait problem.  Skin: Negative for rash.  Neurological: Negative for dizziness, syncope, speech difficulty, weakness, numbness and headaches.  Hematological: Negative for adenopathy. Does not bruise/bleed easily.  Psychiatric/Behavioral: Negative for behavioral problems and dysphoric mood. The patient is not nervous/anxious.        Objective:   Physical Exam  Constitutional: He is oriented to person, place, and time. He appears well-developed.  HENT:  Head: Normocephalic.  Right Ear: External ear normal.  Left Ear: External ear normal.  Eyes: Conjunctivae and EOM are normal.  Neck: Normal range of motion.  Cardiovascular: Normal rate and normal heart sounds.   Pulmonary/Chest: Breath sounds normal.  Abdominal: Bowel sounds are normal.  Musculoskeletal: Normal range of motion. He exhibits no edema and no tenderness.  Neurological: He is alert and oriented to person, place, and time.  Psychiatric: He has a normal mood and affect. His behavior is normal.          Assessment & Plan:    Diabetes mellitus. Will check a hemoglobin A1c Hypertension. We'll maintain restricted sodium diet. Home blood pressure monitoring encouraged. Recheck 3 months

## 2012-10-11 NOTE — Patient Instructions (Signed)
Limit your sodium (Salt) intake   Please check your hemoglobin A1c every 3 months  Please check your blood pressure on a regular basis.  If it is consistently greater than 150/90, please make an office appointment.   

## 2012-10-18 ENCOUNTER — Other Ambulatory Visit (INDEPENDENT_AMBULATORY_CARE_PROVIDER_SITE_OTHER): Payer: Medicare Other | Admitting: Vascular Surgery

## 2012-10-18 ENCOUNTER — Encounter: Payer: Self-pay | Admitting: Neurosurgery

## 2012-10-18 ENCOUNTER — Ambulatory Visit (INDEPENDENT_AMBULATORY_CARE_PROVIDER_SITE_OTHER): Payer: Medicare Other | Admitting: Neurosurgery

## 2012-10-18 ENCOUNTER — Other Ambulatory Visit: Payer: Self-pay | Admitting: *Deleted

## 2012-10-18 DIAGNOSIS — I6529 Occlusion and stenosis of unspecified carotid artery: Secondary | ICD-10-CM

## 2012-10-18 DIAGNOSIS — Z48812 Encounter for surgical aftercare following surgery on the circulatory system: Secondary | ICD-10-CM

## 2012-10-18 NOTE — Progress Notes (Signed)
[facility] Carotid Office Note  CC: Carotid surveillance Referring Physician: Fields  History of Present Illness: 78 year old male patient of Dr. Oneida Alar status post a right carotid angioplasty in October 2001. The patient denies any signs or symptoms of CVA, TIA, amaurosis fugax or any neural deficit. The patient denies any new medical diagnoses or recent surgery.  Past Medical History  Diagnosis Date  . 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 CAROTID ARTERY STENOSIS 05/10/2010  . DIABETES MELLITUS, TYPE II 09/19/2007  . DISEASE, CEREBROVASCULAR NEC 03/05/2007  . GERD 03/13/2007  . HYPERLIPIDEMIA 03/05/2007  . HYPERTENSION 03/05/2007  . HYPOKALEMIA 11/09/2009  . KNEE PAIN, RIGHT 11/09/2009  . PROSTATE CANCER, HX OF 03/05/2007  . RENAL DISEASE, CHRONIC 02/03/2009    ROS: [x]  Positive   [ ]  Denies    General: [ ]  Weight loss, [ ]  Fever, [ ]  chills Neurologic: [ ]  Dizziness, [ ]  Blackouts, [ ]  Seizure [ ]  Stroke, [ ]  "Mini stroke", [ ]  Slurred speech, [ ]  Temporary blindness; [ ]  weakness in arms or legs, [ ]  Hoarseness Cardiac: [ ]  Chest pain/pressure, [ ]  Shortness of breath at rest [ ]  Shortness of breath with exertion, [ ]  Atrial fibrillation or irregular heartbeat Vascular: [ ]  Pain in legs with walking, [ ]  Pain in legs at rest, [ ]  Pain in legs at night,  [ ]  Non-healing ulcer, [ ]  Blood clot in vein/DVT,   Pulmonary: [ ]  Home oxygen, [ ]  Productive cough, [ ]  Coughing up blood, [ ]  Asthma,  [ ]  Wheezing Musculoskeletal:  [ ]  Arthritis, [ ]  Low back pain, [ ]  Joint pain Hematologic: [ ]  Easy Bruising, [ ]  Anemia; [ ]  Hepatitis Gastrointestinal: [ ]  Blood in stool, [ ]  Gastroesophageal Reflux/heartburn, [ ]  Trouble swallowing Urinary: [ ]  chronic Kidney disease, [ ]  on HD - [ ]  MWF or [ ]  TTHS, [ ]  Burning with urination, [ ]  Difficulty urinating Skin: [ ]  Rashes, [ ]  Wounds Psychological: [ ]  Anxiety, [ ]  Depression   Social History History   Substance Use Topics  . Smoking status: Former Smoker    Types: Cigarettes    Quit date: 08/22/1974  . Smokeless tobacco: Never Used  . Alcohol Use: No    Family History Family History  Problem Relation Age of Onset  . Hypertension Mother   . Cancer Father   . Cancer Brother     Allergies  Allergen Reactions  . Aspirin Other (See Comments)    High doses causes stomach ulcer and bleeding    Current Outpatient Prescriptions  Medication Sig Dispense Refill  . amlodipine-atorvastatin (CADUET) 10-20 MG per tablet Take 1 tablet by mouth daily.      Marland Kitchen aspirin 81 MG tablet Take 81 mg by mouth daily.        . clopidogrel (PLAVIX) 75 MG tablet TAKE 1 TABLET EVERY DAY  100 tablet  1  . diphenoxylate-atropine (LOMOTIL) 2.5-0.025 MG per tablet Take 1 tablet by mouth 4 (four) times daily as needed.      Marland Kitchen esomeprazole (NEXIUM) 40 MG capsule Take 40 mg by mouth daily before breakfast.      . glipiZIDE (GLUCOTROL XL) 5 MG 24 hr tablet Take 5 mg by mouth daily.      Marland Kitchen glucose blood test strip 1 each by Other route daily as needed.      . hydrocortisone-pramoxine (ANALPRAM-HC) 2.5-1 % rectal cream USE AS DIRECTED  28.35  g  1  . KLOR-CON M20 20 MEQ tablet TAKE 1 TABLET EVERY DAY  100 tablet  1  . losartan (COZAAR) 100 MG tablet Take 100 mg by mouth daily.      Marland Kitchen NEXIUM 40 MG capsule TAKE ONE CAPSULE EVERY DAY  90 capsule  3   No current facility-administered medications for this visit.    Physical Examination  Filed Vitals:   10/18/12 1217  BP: 151/79  Pulse: 71  Resp:     Body mass index is 29.44 kg/(m^2).  General:  WDWN in NAD Gait: Normal HEENT: WNL Eyes: Pupils equal Pulmonary: normal non-labored breathing , without Rales, rhonchi,  wheezing Cardiac: RRR, without  Murmurs, rubs or gallops; Abdomen: soft, NT, no masses Skin: no rashes, ulcers noted  Vascular Exam Pulses: 3+ radial pulses bilaterally Carotid bruits: Carotid pulses to auscultation no bruits are  heard Extremities without ischemic changes, no Gangrene , no cellulitis; no open wounds;  Musculoskeletal: no muscle wasting or atrophy   Neurologic: A&O X 3; Appropriate Affect ; SENSATION: normal; MOTOR FUNCTION:  moving all extremities equally. Speech is fluent/normal  Non-Invasive Vascular Imaging CAROTID DUPLEX 10/18/2012  Right ICA 0 - 19% stenosis Left ICA 0 - 19% stenosis   ASSESSMENT/PLAN: Asymptomatic patient with stable carotid exam. The patient will followup in one year with repeat carotid duplex. The patient's questions were encouraged and answered, he is in agreement with this plan.  Beatris Ship ANP   Clinic MD: Oneida Alar

## 2012-11-08 ENCOUNTER — Other Ambulatory Visit: Payer: Self-pay | Admitting: Internal Medicine

## 2012-11-22 ENCOUNTER — Encounter: Payer: Self-pay | Admitting: Internal Medicine

## 2012-11-22 ENCOUNTER — Ambulatory Visit (INDEPENDENT_AMBULATORY_CARE_PROVIDER_SITE_OTHER): Payer: Medicare Other | Admitting: Internal Medicine

## 2012-11-22 VITALS — BP 140/70 | HR 79 | Temp 98.6°F | Resp 18 | Wt 190.0 lb

## 2012-11-22 DIAGNOSIS — I6789 Other cerebrovascular disease: Secondary | ICD-10-CM

## 2012-11-22 DIAGNOSIS — N289 Disorder of kidney and ureter, unspecified: Secondary | ICD-10-CM

## 2012-11-22 DIAGNOSIS — E119 Type 2 diabetes mellitus without complications: Secondary | ICD-10-CM

## 2012-11-22 DIAGNOSIS — I10 Essential (primary) hypertension: Secondary | ICD-10-CM

## 2012-11-22 DIAGNOSIS — E785 Hyperlipidemia, unspecified: Secondary | ICD-10-CM

## 2012-11-22 NOTE — Progress Notes (Signed)
Subjective:    Patient ID: Austin [name], male    DOB: 1934/09/27, 78 y.o.   MRN: [id number]  HPI  78 year old patient who has diabetes chronic kidney disease and hypertension. He has carotid artery disease and has had a recent vascular evaluation. He continues to do quite well. Denies any cardiopulmonary or focal neurological symptoms. Past Medical History  Diagnosis Date  . 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 CAROTID ARTERY STENOSIS 05/10/2010  . DIABETES MELLITUS, TYPE II 09/19/2007  . DISEASE, CEREBROVASCULAR NEC 03/05/2007  . GERD 03/13/2007  . HYPERLIPIDEMIA 03/05/2007  . HYPERTENSION 03/05/2007  . HYPOKALEMIA 11/09/2009  . KNEE PAIN, RIGHT 11/09/2009  . PROSTATE CANCER, HX OF 03/05/2007  . RENAL DISEASE, CHRONIC 02/03/2009    History   Social History  . Marital Status: Married    Spouse Name: N/A    Number of Children: N/A  . Years of Education: N/A   Occupational History  . Not on file.   Social History Main Topics  . Smoking status: Former Smoker    Types: Cigarettes    Quit date: 08/22/1974  . Smokeless tobacco: Never Used  . Alcohol Use: No  . Drug Use: No  . Sexually Active: Not on file   Other Topics Concern  . Not on file   Social History Narrative  . No narrative on file    Past Surgical History  Procedure Laterality Date  . Carotid artery angioplasty    . Prostate surgery      prostatectomy  . Laparoscopic appendectomy  02/20/2012    Procedure: APPENDECTOMY LAPAROSCOPIC;  Surgeon: Stark Klein, MD;  Location: [facility];  Service: General;  Laterality: N/A;    Family History  Problem Relation Age of Onset  . Hypertension Mother   . Cancer Father   . Cancer Brother     Allergies  Allergen Reactions  . Aspirin Other (See Comments)    High doses causes stomach ulcer and bleeding    Current Outpatient Prescriptions on File Prior to Visit  Medication Sig Dispense Refill  . amlodipine-atorvastatin (CADUET) 10-20 MG per tablet Take 1 tablet by  mouth daily.      Marland Kitchen aspirin 81 MG tablet Take 81 mg by mouth daily.        . clopidogrel (PLAVIX) 75 MG tablet TAKE 1 TABLET EVERY DAY  100 tablet  1  . diphenoxylate-atropine (LOMOTIL) 2.5-0.025 MG per tablet Take 1 tablet by mouth 4 (four) times daily as needed.      Marland Kitchen glipiZIDE (GLUCOTROL XL) 5 MG 24 hr tablet Take 5 mg by mouth daily.      Marland Kitchen glucose blood test strip 1 each by Other route daily as needed.      . hydrocortisone-pramoxine (ANALPRAM-HC) 2.5-1 % rectal cream USE AS DIRECTED  28.35 g  1  . KLOR-CON M20 20 MEQ tablet TAKE 1 TABLET BY MOUTH EVERY DAY  100 tablet  1  . losartan (COZAAR) 100 MG tablet Take 100 mg by mouth daily.      Marland Kitchen NEXIUM 40 MG capsule TAKE ONE CAPSULE EVERY DAY  90 capsule  3   No current facility-administered medications on file prior to visit.    BP 140/70  Pulse 79  Temp(Src) 98.6 F (37 C) (Oral)  Resp 18  Wt 190 lb (86.183 kg)  BMI 29.75 kg/m2  SpO2 96%       Review of Systems  Constitutional: Negative for fever, chills, appetite change and fatigue.  HENT:  Negative for hearing loss, ear pain, congestion, sore throat, trouble swallowing, neck stiffness, dental problem, voice change and tinnitus.   Eyes: Negative for pain, discharge and visual disturbance.  Respiratory: Negative for cough, chest tightness, wheezing and stridor.   Cardiovascular: Negative for chest pain, palpitations and leg swelling.  Gastrointestinal: Negative for nausea, vomiting, abdominal pain, diarrhea, constipation, blood in stool and abdominal distention.  Genitourinary: Negative for urgency, hematuria, flank pain, discharge, difficulty urinating and genital sores.  Musculoskeletal: Negative for myalgias, back pain, joint swelling, arthralgias and gait problem.  Skin: Negative for rash.  Neurological: Negative for dizziness, syncope, speech difficulty, weakness, numbness and headaches.  Hematological: Negative for adenopathy. Does not bruise/bleed easily.   Psychiatric/Behavioral: Negative for behavioral problems and dysphoric mood. The patient is not nervous/anxious.        Objective:   Physical Exam  Constitutional: He is oriented to person, place, and time. He appears well-developed.  HENT:  Head: Normocephalic.  Right Ear: External ear normal.  Left Ear: External ear normal.  Eyes: Conjunctivae and EOM are normal.  Neck: Normal range of motion.  Cardiovascular: Normal rate and normal heart sounds.   Pulmonary/Chest: Breath sounds normal.  Abdominal: Bowel sounds are normal.  Musculoskeletal: Normal range of motion. He exhibits no edema and no tenderness.  Neurological: He is alert and oriented to person, place, and time.  Psychiatric: He has a normal mood and affect. His behavior is normal.          Assessment & Plan:   Diabetes mellitus. Hemoglobin A 1C is have been well-controlled Hypertension Dyslipidemia  No change medication CPX 3 months

## 2012-11-22 NOTE — Patient Instructions (Signed)
Limit your sodium (Salt) intake   Please check your hemoglobin A1c every 3 months   

## 2012-12-19 ENCOUNTER — Other Ambulatory Visit: Payer: Self-pay | Admitting: Internal Medicine

## 2012-12-26 ENCOUNTER — Other Ambulatory Visit: Payer: Self-pay | Admitting: Internal Medicine

## 2013-01-11 ENCOUNTER — Other Ambulatory Visit: Payer: Self-pay | Admitting: Internal Medicine

## 2013-01-26 ENCOUNTER — Encounter (HOSPITAL_COMMUNITY): Payer: Self-pay | Admitting: Emergency Medicine

## 2013-01-26 ENCOUNTER — Emergency Department (HOSPITAL_COMMUNITY)
Admission: EM | Admit: 2013-01-26 | Discharge: 2013-01-26 | Disposition: A | Discharge status: Home / Self Care | Payer: Medicare Other | Attending: Emergency Medicine | Admitting: Emergency Medicine

## 2013-01-26 DIAGNOSIS — I10 Essential (primary) hypertension: Secondary | ICD-10-CM | POA: Insufficient documentation

## 2013-01-26 DIAGNOSIS — E119 Type 2 diabetes mellitus without complications: Secondary | ICD-10-CM | POA: Insufficient documentation

## 2013-01-26 DIAGNOSIS — Z87891 Personal history of nicotine dependence: Secondary | ICD-10-CM | POA: Insufficient documentation

## 2013-01-26 DIAGNOSIS — R809 Proteinuria, unspecified: Secondary | ICD-10-CM

## 2013-01-26 DIAGNOSIS — Z79899 Other long term (current) drug therapy: Secondary | ICD-10-CM | POA: Insufficient documentation

## 2013-01-26 DIAGNOSIS — Z87448 Personal history of other diseases of urinary system: Secondary | ICD-10-CM | POA: Insufficient documentation

## 2013-01-26 DIAGNOSIS — Z8719 Personal history of other diseases of the digestive system: Secondary | ICD-10-CM | POA: Insufficient documentation

## 2013-01-26 DIAGNOSIS — Z862 Personal history of diseases of the blood and blood-forming organs and certain disorders involving the immune mechanism: Secondary | ICD-10-CM | POA: Insufficient documentation

## 2013-01-26 DIAGNOSIS — Z8679 Personal history of other diseases of the circulatory system: Secondary | ICD-10-CM | POA: Insufficient documentation

## 2013-01-26 DIAGNOSIS — Z8639 Personal history of other endocrine, nutritional and metabolic disease: Secondary | ICD-10-CM | POA: Insufficient documentation

## 2013-01-26 DIAGNOSIS — R42 Dizziness and giddiness: Secondary | ICD-10-CM

## 2013-01-26 DIAGNOSIS — Z7982 Long term (current) use of aspirin: Secondary | ICD-10-CM | POA: Insufficient documentation

## 2013-01-26 DIAGNOSIS — J3489 Other specified disorders of nose and nasal sinuses: Secondary | ICD-10-CM | POA: Insufficient documentation

## 2013-01-26 DIAGNOSIS — Z8546 Personal history of malignant neoplasm of prostate: Secondary | ICD-10-CM | POA: Insufficient documentation

## 2013-01-26 LAB — CBC
MCH: 27.9 pg (ref 26.0–34.0)
MCHC: 35.7 g/dL (ref 30.0–36.0)
Platelets: 292 10*3/uL (ref 150–400)
RBC: 5.42 MIL/uL (ref 4.22–5.81)

## 2013-01-26 LAB — COMPREHENSIVE METABOLIC PANEL
ALT: 11 U/L (ref 0–53)
AST: 35 U/L (ref 0–37)
Albumin: 4.4 g/dL (ref 3.5–5.2)
Alkaline Phosphatase: 108 U/L (ref 39–117)
Calcium: 9.6 mg/dL (ref 8.4–10.5)
Potassium: 5.4 mEq/L — ABNORMAL HIGH (ref 3.5–5.1)
Sodium: 136 mEq/L (ref 135–145)
Total Protein: 8.3 g/dL (ref 6.0–8.3)

## 2013-01-26 LAB — URINALYSIS, ROUTINE W REFLEX MICROSCOPIC
Bilirubin Urine: NEGATIVE
Glucose, UA: NEGATIVE mg/dL
Nitrite: NEGATIVE
Specific Gravity, Urine: 1.014 (ref 1.005–1.030)
pH: 6.5 (ref 5.0–8.0)

## 2013-01-26 LAB — URINE MICROSCOPIC-ADD ON

## 2013-01-26 MED ORDER — MECLIZINE HCL 25 MG PO TABS
12.5000 mg | ORAL_TABLET | Freq: Once | ORAL | Status: AC
  Administered 2013-01-26: 12.5 mg via ORAL
  Filled 2013-01-26: qty 1

## 2013-01-26 NOTE — ED Notes (Signed)
Patient states that he feels slight dizziness at this time. "like a tight band around my head"

## 2013-01-26 NOTE — ED Notes (Signed)
BIB family. Dizziness starting last night. "like room was spinning" when lying down or standing quickly. No recent falls or other events that patient can recall that may be contributing factor. NAD

## 2013-01-26 NOTE — ED Provider Notes (Signed)
Austin [name] S 8:00 PM patient discussed in signout with Dr. Delice Lesch. Patient with slight dizziness symptoms. Unremarkable exam. Initial lab testings negative. Slight hypokalemia however there was significant homolysis. UA is pending however patient is felt likely to be stable for discharge home.  UA without any signs of UTI however there is large amounts of protein. Unremarkable BUN and creatinine. This was discussed with patient. He is advised to follow up with his primary care provider for continued monitoring.  Martie Lee, PA-C 01/26/13 2212

## 2013-01-26 NOTE — ED Provider Notes (Signed)
History     CSN: [id number]  Arrival date & time 01/26/13  [id number]   First MD Initiated Contact with Patient 01/26/13 [phone]      Chief complaint: dizziness  (Consider location/radiation/quality/duration/timing/severity/associated sxs/prior treatment) The history is provided by the patient and the spouse.  pt c/o feeling 'dizzy' onset 2 nights ago, at rest. Describes sensation of being more like a mild lightheadedness, put says when did change position quickly he also transiently experienced a sense of room spinning. Saw his doctor/cardiologist yesterday for same, states no specific dx. Today symptoms improved, but not resolved. Does note recent nasal/sinus congestion. No headache. Denies hearing loss or tinnitus. Denies any problems w balance, coordination, or gait. No change in speech or vision.  No numbness/weakness. Denies palpitations or sense of irregular or fast heartbeat. No current or recent cp or discomfort. No sob or unusual doe. No blood loss, rectal bleeding, or melena. Denies recent change in meds.     Past Medical History  Diagnosis Date  . 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 CAROTID ARTERY STENOSIS 05/10/2010  . DIABETES MELLITUS, TYPE II 09/19/2007  . DISEASE, CEREBROVASCULAR NEC 03/05/2007  . GERD 03/13/2007  . HYPERLIPIDEMIA 03/05/2007  . HYPERTENSION 03/05/2007  . HYPOKALEMIA 11/09/2009  . KNEE PAIN, RIGHT 11/09/2009  . PROSTATE CANCER, HX OF 03/05/2007  . RENAL DISEASE, CHRONIC 02/03/2009    Past Surgical History  Procedure Laterality Date  . Carotid artery angioplasty    . Prostate surgery      prostatectomy  . Laparoscopic appendectomy  02/20/2012    Procedure: APPENDECTOMY LAPAROSCOPIC;  Surgeon: Stark Klein, MD;  Location: [facility];  Service: General;  Laterality: N/A;    Family History  Problem Relation Age of Onset  . Hypertension Mother   . Cancer Father   . Cancer Brother     History  Substance Use Topics  . Smoking status: Former Smoker    Types:  Cigarettes    Quit date: 08/22/1974  . Smokeless tobacco: Never Used  . Alcohol Use: No      Review of Systems  Constitutional: Negative for fever and chills.  HENT: Positive for congestion. Negative for hearing loss, neck pain and tinnitus.   Eyes: Negative for redness.  Respiratory: Negative for cough and shortness of breath.   Cardiovascular: Negative for chest pain and palpitations.  Gastrointestinal: Negative for nausea, vomiting, abdominal pain and blood in stool.  Genitourinary: Negative for flank pain.  Musculoskeletal: Negative for back pain.  Skin: Negative for rash.  Neurological: Negative for syncope, weakness, numbness and headaches.  Hematological: Does not bruise/bleed easily.  Psychiatric/Behavioral: Negative for confusion.    Allergies  Aspirin  Home Medications   Current Outpatient Rx  Name  Route  Sig  Dispense  Refill  . amlodipine-atorvastatin (CADUET) 10-20 MG per tablet   Oral   Take 1 tablet by mouth daily.         Marland Kitchen amlodipine-atorvastatin (CADUET) 10-20 MG per tablet      TAKE 1 TABLET EVERY DAY   90 tablet   1   . aspirin 81 MG tablet   Oral   Take 81 mg by mouth daily.           . clopidogrel (PLAVIX) 75 MG tablet      TAKE 1 TABLET EVERY DAY   100 tablet   1   . diphenoxylate-atropine (LOMOTIL) 2.5-0.025 MG per tablet   Oral   Take 1 tablet by mouth 4 (four)  times daily as needed.         Marland Kitchen glipiZIDE (GLUCOTROL XL) 5 MG 24 hr tablet   Oral   Take 5 mg by mouth daily.         Marland Kitchen glipiZIDE (GLUCOTROL XL) 5 MG 24 hr tablet      TAKE 1 TABLET BY MOUTH EVERY DAY   90 tablet   1   . glucose blood test strip   Other   1 each by Other route daily as needed.         . hydrocortisone-pramoxine (ANALPRAM-HC) 2.5-1 % rectal cream      USE AS DIRECTED   28.35 g   1   . KLOR-CON M20 20 MEQ tablet      TAKE 1 TABLET BY MOUTH EVERY DAY   100 tablet   1   . losartan (COZAAR) 100 MG tablet   Oral   Take 100 mg by  mouth daily.         Marland Kitchen losartan (COZAAR) 100 MG tablet      TAKE 1 TABLET BY MOUTH EVERY DAY   90 tablet   1   . NEXIUM 40 MG capsule      TAKE ONE CAPSULE EVERY DAY   90 capsule   3     There were no vitals taken for this visit.  Physical Exam  Nursing note and vitals reviewed. Constitutional: He is oriented to person, place, and time. He appears well-developed and well-nourished. No distress.  HENT:  Nose: Nose normal.  Mouth/Throat: Oropharynx is clear and moist.  Eyes: Conjunctivae and EOM are normal. Pupils are equal, round, and reactive to light.  Neck: Neck supple. No tracheal deviation present.  No bruit  Cardiovascular: Normal rate, normal heart sounds and intact distal pulses.   Pulmonary/Chest: Effort normal and breath sounds normal. No accessory muscle usage. No respiratory distress.  Abdominal: Soft. Bowel sounds are normal. He exhibits no distension and no mass. There is no tenderness. There is no rebound and no guarding.  Musculoskeletal: Normal range of motion. He exhibits no edema and no tenderness.  Neurological: He is alert and oriented to person, place, and time. No cranial nerve deficit.  No pronator drift. Motor intact bil 5/5. Romberg testing normal. Ambulates w steady gait.   Skin: Skin is warm and dry.  Psychiatric: He has a normal mood and affect.    ED Course  Procedures (including critical care time)  Results for orders placed during the hospital encounter of 01/26/13  CBC      Result Value Range   WBC 9.7  4.0 - 10.5 K/uL   RBC 5.42  4.22 - 5.81 MIL/uL   Hemoglobin 15.1  13.0 - 17.0 g/dL   HCT 42.3  39.0 - 52.0 %   MCV 78.0  78.0 - 100.0 fL   MCH 27.9  26.0 - 34.0 pg   MCHC 35.7  30.0 - 36.0 g/dL   RDW 13.9  11.5 - 15.5 %   Platelets 292  150 - 400 K/uL         MDM  Labs. Orthostatics. antivert 12.5 mg po.  Reviewed nursing notes and prior charts for additional history.   Recent carotid dopplers from approx 3 months ago  note no signif stenosis/dis of bil carotid and vertebral arteries.     Date: 01/26/2013  Rate: 86  Rhythm: normal sinus rhythm  QRS Axis: normal  Intervals: normal  ST/T Wave abnormalities: nonspecific ST/T changes  Conduction Disutrbances:lvh  Narrative Interpretation:   Old EKG Reviewed: unchanged  Pt not orthostatic. hgb normal.  Additional labs pending.    pts neuro exam non-focal, ambulates w steady gait. No instability or imbalance.  Signed out to PA Dammen to check labs when back, recheck pt, and dispo appropriately, involving oncoming EDP/[facility] if any question/concern.            Mirna Mires, MD 01/26/13 2019

## 2013-01-27 NOTE — ED Provider Notes (Signed)
Medical screening examination/treatment/procedure(s) were performed by non-physician practitioner and as supervising physician I was immediately available for consultation/collaboration.    Dot Lanes, MD 01/27/13 1524

## 2013-01-28 ENCOUNTER — Ambulatory Visit (INDEPENDENT_AMBULATORY_CARE_PROVIDER_SITE_OTHER): Payer: Medicare Other | Admitting: Internal Medicine

## 2013-01-28 ENCOUNTER — Encounter: Payer: Self-pay | Admitting: Internal Medicine

## 2013-01-28 DIAGNOSIS — H8110 Benign paroxysmal vertigo, unspecified ear: Secondary | ICD-10-CM

## 2013-01-28 DIAGNOSIS — I10 Essential (primary) hypertension: Secondary | ICD-10-CM

## 2013-01-28 DIAGNOSIS — I6529 Occlusion and stenosis of unspecified carotid artery: Secondary | ICD-10-CM

## 2013-01-28 DIAGNOSIS — E119 Type 2 diabetes mellitus without complications: Secondary | ICD-10-CM

## 2013-01-28 DIAGNOSIS — I6789 Other cerebrovascular disease: Secondary | ICD-10-CM

## 2013-01-28 NOTE — Progress Notes (Signed)
Subjective:    Patient ID: Austin [name], male    DOB: Aug 22, 1935, 78 y.o.   MRN: [id number]  HPI  78 year old patient who is seen for followup of vertigo. This began 4 days ago and today is markedly improved. Patient was seen in the ED 2 days ago and was treated with meclizine. Patient has a history of cerebrovascular and carotid artery disease. The patient's interventional radiologist has recommended followup MRA. Patient has treated hypertension diabetes and chronic kidney disease The patient did have a followup carotid artery ultrasound in February  Past Medical History  Diagnosis Date  . 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 CAROTID ARTERY STENOSIS 05/10/2010  . DIABETES MELLITUS, TYPE II 09/19/2007  . DISEASE, CEREBROVASCULAR NEC 03/05/2007  . GERD 03/13/2007  . HYPERLIPIDEMIA 03/05/2007  . HYPERTENSION 03/05/2007  . HYPOKALEMIA 11/09/2009  . KNEE PAIN, RIGHT 11/09/2009  . PROSTATE CANCER, HX OF 03/05/2007  . RENAL DISEASE, CHRONIC 02/03/2009    History   Social History  . Marital Status: Married    Spouse Name: N/A    Number of Children: N/A  . Years of Education: N/A   Occupational History  . Not on file.   Social History Main Topics  . Smoking status: Former Smoker    Types: Cigarettes    Quit date: 08/22/1974  . Smokeless tobacco: Never Used  . Alcohol Use: No  . Drug Use: No  . Sexually Active: Not on file   Other Topics Concern  . Not on file   Social History Narrative  . No narrative on file    Past Surgical History  Procedure Laterality Date  . Carotid artery angioplasty    . Prostate surgery      prostatectomy  . Laparoscopic appendectomy  02/20/2012    Procedure: APPENDECTOMY LAPAROSCOPIC;  Surgeon: Stark Klein, MD;  Location: [facility] OR;  Service: General;  Laterality: N/A;    Family History  Problem Relation Age of Onset  . Hypertension Mother   . Cancer Father   . Cancer Brother     Allergies  Allergen Reactions  . Aspirin Other (See  Comments)    High doses causes stomach ulcer and bleeding    Current Outpatient Prescriptions on File Prior to Visit  Medication Sig Dispense Refill  . amlodipine-atorvastatin (CADUET) 10-20 MG per tablet Take 1 tablet by mouth daily.      Marland Kitchen aspirin 81 MG tablet Take 81 mg by mouth daily.        . clopidogrel (PLAVIX) 75 MG tablet TAKE 1 TABLET EVERY DAY  100 tablet  1  . diphenoxylate-atropine (LOMOTIL) 2.5-0.025 MG per tablet Take 1 tablet by mouth 4 (four) times daily as needed for diarrhea or loose stools.       Marland Kitchen glipiZIDE (GLUCOTROL XL) 5 MG 24 hr tablet Take 5 mg by mouth daily.      Marland Kitchen glucose blood test strip 1 each by Other route daily as needed.      Marland Kitchen KLOR-CON M20 20 MEQ tablet TAKE 1 TABLET BY MOUTH EVERY DAY  100 tablet  1  . losartan (COZAAR) 100 MG tablet Take 100 mg by mouth daily.      Marland Kitchen NEXIUM 40 MG capsule TAKE ONE CAPSULE EVERY DAY  90 capsule  3   No current facility-administered medications on file prior to visit.    BP 150/70  Pulse 87  Temp(Src) 98.6 F (37 C) (Oral)  Resp 20  Wt 192 lb (87.091 kg)  BMI 30.06 kg/m2  SpO2 97%       Review of Systems  Constitutional: Negative for fever, chills, appetite change and fatigue.  HENT: Negative for hearing loss, ear pain, congestion, sore throat, trouble swallowing, neck stiffness, dental problem, voice change and tinnitus.   Eyes: Negative for pain, discharge and visual disturbance.  Respiratory: Negative for cough, chest tightness, wheezing and stridor.   Cardiovascular: Negative for chest pain, palpitations and leg swelling.  Gastrointestinal: Negative for nausea, vomiting, abdominal pain, diarrhea, constipation, blood in stool and abdominal distention.  Genitourinary: Negative for urgency, hematuria, flank pain, discharge, difficulty urinating and genital sores.  Musculoskeletal: Negative for myalgias, back pain, joint swelling, arthralgias and gait problem.  Skin: Negative for rash.  Neurological:  Positive for dizziness and light-headedness. Negative for syncope, speech difficulty, weakness, numbness and headaches.  Hematological: Negative for adenopathy. Does not bruise/bleed easily.  Psychiatric/Behavioral: Negative for behavioral problems and dysphoric mood. The patient is not nervous/anxious.        Objective:   Physical Exam  Constitutional: He is oriented to person, place, and time. He appears well-developed.  HENT:  Head: Normocephalic.  Right Ear: External ear normal.  Left Ear: External ear normal.  Eyes: Conjunctivae and EOM are normal.  Neck: Normal range of motion.  Cardiovascular: Normal rate and normal heart sounds.   Pulmonary/Chest: Breath sounds normal.  Abdominal: Bowel sounds are normal.  Musculoskeletal: Normal range of motion. He exhibits no edema and no tenderness.  Neurological: He is alert and oriented to person, place, and time. No cranial nerve deficit. Coordination normal.  Psychiatric: He has a normal mood and affect. His behavior is normal.          Assessment & Plan:   Benign positional vertigo Carotid artery stenosis Cerebral vascular disease. Continue Plavix and aspirin Hypertension  Diabetes mellitus. Will check a hemoglobin A1c

## 2013-01-28 NOTE — Patient Instructions (Addendum)
Limit your sodium (Salt) intake   Please check your hemoglobin A1c every 3 months Benign Positional Vertigo Vertigo means you feel like you or your surroundings are moving when they are not. Benign positional vertigo is the most common form of vertigo. Benign means that the cause of your condition is not serious. Benign positional vertigo is more common in older adults. CAUSES  Benign positional vertigo is the result of an upset in the labyrinth system. This is an area in the middle ear that helps control your balance. This may be caused by a viral infection, head injury, or repetitive motion. However, often no specific cause is found. SYMPTOMS  Symptoms of benign positional vertigo occur when you move your head or eyes in different directions. Some of the symptoms may include:  Loss of balance and falls.  Vomiting.  Blurred vision.  Dizziness.  Nausea.  Involuntary eye movements (nystagmus). DIAGNOSIS  Benign positional vertigo is usually diagnosed by physical exam. If the specific cause of your benign positional vertigo is unknown, your caregiver may perform imaging tests, such as magnetic resonance imaging (MRI) or computed tomography (CT). TREATMENT  Your caregiver may recommend movements or procedures to correct the benign positional vertigo. Medicines such as meclizine, benzodiazepines, and medicines for nausea may be used to treat your symptoms. In rare cases, if your symptoms are caused by certain conditions that affect the inner ear, you may need surgery. HOME CARE INSTRUCTIONS   Follow your caregiver's instructions.  Move slowly. Do not make sudden body or head movements.  Avoid driving.  Avoid operating heavy machinery.  Avoid performing any tasks that would be dangerous to you or others during a vertigo episode.  Drink enough fluids to keep your urine clear or pale yellow. SEEK IMMEDIATE MEDICAL CARE IF:   You develop problems with walking, weakness, numbness, or  using your arms, hands, or legs.  You have difficulty speaking.  You develop severe headaches.  Your nausea or vomiting continues or gets worse.  You develop visual changes.  Your family or friends notice any behavioral changes.  Your condition gets worse.  You have a fever.  You develop a stiff neck or sensitivity to light. MAKE SURE YOU:   Understand these instructions.  Will watch your condition.  Will get help right away if you are not doing well or get worse. Document Released: 05/16/2006 Document Revised: 10/31/2011 Document Reviewed: 04/28/2011 [facility] Patient Information 2014 [place].

## 2013-02-04 LAB — HM DIABETES EYE EXAM

## 2013-02-19 ENCOUNTER — Other Ambulatory Visit: Payer: Self-pay | Admitting: Internal Medicine

## 2013-02-28 ENCOUNTER — Encounter: Payer: Medicare Other | Admitting: Internal Medicine

## 2013-02-28 ENCOUNTER — Other Ambulatory Visit: Payer: Self-pay

## 2013-03-07 ENCOUNTER — Encounter: Payer: Self-pay | Admitting: Internal Medicine

## 2013-03-07 ENCOUNTER — Ambulatory Visit (INDEPENDENT_AMBULATORY_CARE_PROVIDER_SITE_OTHER): Payer: Medicare Other | Admitting: Internal Medicine

## 2013-03-07 VITALS — BP 140/72 | HR 78 | Temp 98.3°F | Resp 20 | Ht 67.25 in | Wt 193.0 lb

## 2013-03-07 DIAGNOSIS — E785 Hyperlipidemia, unspecified: Secondary | ICD-10-CM

## 2013-03-07 DIAGNOSIS — E119 Type 2 diabetes mellitus without complications: Secondary | ICD-10-CM

## 2013-03-07 DIAGNOSIS — N289 Disorder of kidney and ureter, unspecified: Secondary | ICD-10-CM

## 2013-03-07 DIAGNOSIS — E876 Hypokalemia: Secondary | ICD-10-CM

## 2013-03-07 DIAGNOSIS — D500 Iron deficiency anemia secondary to blood loss (chronic): Secondary | ICD-10-CM

## 2013-03-07 DIAGNOSIS — I10 Essential (primary) hypertension: Secondary | ICD-10-CM

## 2013-03-07 DIAGNOSIS — Z8546 Personal history of malignant neoplasm of prostate: Secondary | ICD-10-CM

## 2013-03-07 DIAGNOSIS — Z0000 Encounter for general adult medical examination without abnormal findings: Secondary | ICD-10-CM

## 2013-03-07 DIAGNOSIS — Z23 Encounter for immunization: Secondary | ICD-10-CM

## 2013-03-07 LAB — LIPID PANEL
LDL Cholesterol: 95 mg/dL (ref 0–99)
Total CHOL/HDL Ratio: 4
Triglycerides: 95 mg/dL (ref 0.0–149.0)

## 2013-03-07 LAB — HEMOGLOBIN A1C: Hgb A1c MFr Bld: 7.2 % — ABNORMAL HIGH (ref 4.6–6.5)

## 2013-03-07 NOTE — Patient Instructions (Signed)
Please check your hemoglobin A1c every 3 months  Limit your sodium (Salt) intake    It is important that you exercise regularly, at least 20 minutes 3 to 4 times per week.  If you develop chest pain or shortness of breath seek  medical attention.  You need to lose weight.  Consider a lower calorie diet and regular exercise. 

## 2013-03-07 NOTE — Progress Notes (Signed)
Patient ID: Austin [name], male   DOB: 17-Sep-1934, 78 y.o.   MRN: [id number]  Subjective:    Patient ID: Austin [name], male    DOB: [birth year], 78 y.o.   MRN: [id number]  HPI  History of Present Illness:   78 year-old patient who is seen today for a wellness exam. He is followed by urology cardiology, as well as vascular surgery. He has a history of carotid artery stenosis and a recent carotid artery. Doppler ultrasound in February 2014 was negative. He has treated hypertension, dyslipidemia, and type 2 diabetes. He has chronic kidney disease.   He is followed by cardiology and ophthalmology. He has had a recent eye exam and is also followed by a retinal specialist. Last colonoscopy approximately 6 yrs ago  Here for Medicare AWV:   1. Risk factors based on Past M, S, F history: cardiovascular risk factors include hypertension, diabetes, and dyslipidemia  2. Physical Activities: remains fairly active. Plans on rejoin his [employer] membership  3. Depression/mood: no history of depression or mood disorder  4. Hearing: no hearing deficits  5. ADL's: independent in all aspects of daily living  6. Fall Risk: low  7. Home Safety: no problems identified  8. Height, weight, &visual acuity:height and weight stable. No change in visual acuity  9. Counseling: heart healthy diet, exercise, weight loss. All encouraged  10. Labs ordered based on risk factors: laboratory profile will be reviewed including hemoglobin A1c and lipid profile  11. Referral Coordination- follow-up urology next month as planned  12. Care Plan- heart healthy diet, weight loss encouraged  13. Cognitive Assessment- alert and oriented, with normal affect. No cognitive dysfunction   Allergies (verified):  No Known Drug Allergies   Past History:  Past Medical History:   Hyperlipidemia  Hypertension  Prostate cancer, hx of; status post radical prostatectomy at [facility], 1993  Cerebrovascular disease status post right carotid angioplasty in  2001  iron deficiency anemia  Diabetes mellitus, type II  CKD (creatinine 1.6)  right knee pain   Past Surgical History:  Carotid angioplasty 01  Prostatectomy 93  colonoscopy 2008  carotid Doppler examination October 2011 Feb 2014  Family History:    father died at 40 of a mesothelioma. mother is 55 has hypertension and history of anemia and 5 brothers two deceased one from prostate cancer and one from coronary artery disease; positive for diabetes  two sisters positive for diabetes   Social History:   Married  retired from [employer] since [place]: Negative for fever, chills, activity change, appetite change and fatigue.  HENT: Negative for hearing loss, ear pain, congestion, rhinorrhea, sneezing, mouth sores, trouble swallowing, neck pain, neck stiffness, dental problem, voice change, sinus pressure and tinnitus.   Eyes: Negative for photophobia, pain, redness and visual disturbance.  Respiratory: Negative for apnea, cough, choking, chest tightness, shortness of breath and wheezing.   Cardiovascular: Negative for chest pain, palpitations and leg swelling.  Gastrointestinal: Negative for nausea, vomiting, abdominal pain, diarrhea, constipation, blood in stool, abdominal distention, anal bleeding and rectal pain.  Genitourinary: Negative for dysuria, urgency, frequency, hematuria, flank pain, decreased urine volume, discharge, penile swelling, scrotal swelling, difficulty urinating, genital sores and testicular pain.  Musculoskeletal: Negative for myalgias, back pain, joint swelling, arthralgias and gait problem.  Skin: Negative for color change, rash and wound.  Neurological: Negative for dizziness, tremors, seizures, syncope, facial asymmetry, speech difficulty, weakness, light-headedness, numbness and headaches.  Hematological: Negative for adenopathy.  Does not bruise/bleed easily.  Psychiatric/Behavioral: Negative for suicidal ideas,  hallucinations, behavioral problems, confusion, sleep disturbance, self-injury, dysphoric mood, decreased concentration and agitation. The patient is not nervous/anxious.        Objective:   Physical Exam  Constitutional: He appears well-developed and well-nourished.  HENT:  Head: Normocephalic and atraumatic.  Right Ear: External ear normal.  Left Ear: External ear normal.  Nose: Nose normal.  Mouth/Throat: Oropharynx is clear and moist.  Eyes: Conjunctivae and EOM are normal. Pupils are equal, round, and reactive to light. No scleral icterus.  Neck: Normal range of motion. Neck supple. No JVD present. No thyromegaly present.  Cardiovascular: Regular rhythm, normal heart sounds and intact distal pulses.  Exam reveals no gallop and no friction rub.   No murmur heard. Pulmonary/Chest: Effort normal and breath sounds normal. He exhibits no tenderness.  Abdominal: Soft. Bowel sounds are normal. He exhibits no distension and no mass. There is no tenderness.  Genitourinary: Prostate normal and penis normal.  Musculoskeletal: Normal range of motion. He exhibits no edema and no tenderness.  Lymphadenopathy:    He has no cervical adenopathy.  Neurological: He is alert. He has normal reflexes. No cranial nerve deficit. Coordination normal.  Skin: Skin is warm and dry. No rash noted.  Psychiatric: He has a normal mood and affect. His behavior is normal.          Assessment & Plan:    Preventive health examination Diabetes mellitus type 2. We'll check a hemoglobin A1c weight loss exercise encouraged Dyslipidemia. We'll check a lipid profile Chronic kidney disease and this is will be checked urine for microalbumin will also be checked Hypertension stable

## 2013-03-08 LAB — MICROALBUMIN / CREATININE URINE RATIO: Microalb, Ur: 241 mg/dL — ABNORMAL HIGH (ref 0.0–1.9)

## 2013-03-11 ENCOUNTER — Other Ambulatory Visit: Payer: Self-pay | Admitting: *Deleted

## 2013-03-11 MED ORDER — METFORMIN HCL ER 500 MG PO TB24: 500.0000 mg | ORAL_TABLET | Freq: Every day | ORAL | Status: DC

## 2013-03-14 ENCOUNTER — Encounter: Payer: Self-pay | Admitting: Internal Medicine

## 2013-03-21 ENCOUNTER — Telehealth: Payer: Self-pay | Admitting: Internal Medicine

## 2013-03-21 NOTE — Telephone Encounter (Signed)
Add metformin to glipizide therapy

## 2013-03-21 NOTE — Telephone Encounter (Signed)
Left message on machine for patient to return our call 

## 2013-03-21 NOTE — Telephone Encounter (Signed)
Pt calling stating he is not sure if he should continue with previously prescribed Metformin along with the newly prescribed Glipizide or only take the Glipizide. No definitive answer in Epic. Please advise.

## 2013-03-22 NOTE — Telephone Encounter (Signed)
Spoke to pt told him yes take Metformin and Glipizide per Dr. Stacie Glaze. Pt verbalized understanding.

## 2013-03-30 ENCOUNTER — Other Ambulatory Visit: Payer: Self-pay | Admitting: Internal Medicine

## 2013-04-13 ENCOUNTER — Other Ambulatory Visit: Payer: Self-pay | Admitting: Internal Medicine

## 2013-05-02 ENCOUNTER — Other Ambulatory Visit: Payer: Self-pay | Admitting: Internal Medicine

## 2013-06-07 ENCOUNTER — Ambulatory Visit: Payer: Medicare Other | Admitting: Internal Medicine

## 2013-06-21 ENCOUNTER — Other Ambulatory Visit: Payer: Self-pay | Admitting: Internal Medicine

## 2013-07-08 ENCOUNTER — Encounter: Payer: Self-pay | Admitting: Internal Medicine

## 2013-07-08 ENCOUNTER — Ambulatory Visit (INDEPENDENT_AMBULATORY_CARE_PROVIDER_SITE_OTHER): Payer: Medicare Other | Admitting: Internal Medicine

## 2013-07-08 VITALS — BP 160/80 | HR 74 | Temp 98.0°F | Resp 20 | Wt 193.0 lb

## 2013-07-08 DIAGNOSIS — N289 Disorder of kidney and ureter, unspecified: Secondary | ICD-10-CM

## 2013-07-08 DIAGNOSIS — I6529 Occlusion and stenosis of unspecified carotid artery: Secondary | ICD-10-CM

## 2013-07-08 DIAGNOSIS — Z23 Encounter for immunization: Secondary | ICD-10-CM

## 2013-07-08 DIAGNOSIS — E785 Hyperlipidemia, unspecified: Secondary | ICD-10-CM

## 2013-07-08 DIAGNOSIS — I10 Essential (primary) hypertension: Secondary | ICD-10-CM

## 2013-07-08 DIAGNOSIS — E119 Type 2 diabetes mellitus without complications: Secondary | ICD-10-CM

## 2013-07-08 LAB — HEMOGLOBIN A1C: Hgb A1c MFr Bld: 6.6 % — ABNORMAL HIGH (ref 4.6–6.5)

## 2013-07-08 NOTE — Patient Instructions (Signed)
Limit your sodium (Salt) intake   Please check your hemoglobin A1c every 3 months    It is important that you exercise regularly, at least 20 minutes 3 to 4 times per week.  If you develop chest pain or shortness of breath seek  medical attention.   

## 2013-07-08 NOTE — Progress Notes (Signed)
Pre-visit discussion using our clinic review tool. No additional management support is needed unless otherwise documented below in the visit note.  

## 2013-07-08 NOTE — Progress Notes (Signed)
Subjective:    Patient ID: Austin [name], male    DOB: Jun 27, 1935, 78 y.o.   MRN: [id number]  HPI Pre-visit discussion using our clinic review tool. No additional management support is needed unless otherwise documented below in the visit note.  78 year old patient who is seen today for [name] followup. Medical problems include type 2 diabetes hypertension and dyslipidemia. He has a history of cerebrovascular disease and is status post carotid artery stenting. Denies any focal neurological complaints. He remains on statin therapy which he continues to tolerate well.  Denies any cardiopulmonary complaints.  He is evaluated by ophthalmology at least twice annually  Past Medical History  Diagnosis Date  . 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 CAROTID ARTERY STENOSIS 05/10/2010  . DIABETES MELLITUS, TYPE II 09/19/2007  . DISEASE, CEREBROVASCULAR NEC 03/05/2007  . GERD 03/13/2007  . HYPERLIPIDEMIA 03/05/2007  . HYPERTENSION 03/05/2007  . HYPOKALEMIA 11/09/2009  . KNEE PAIN, RIGHT 11/09/2009  . PROSTATE CANCER, HX OF 03/05/2007  . RENAL DISEASE, CHRONIC 02/03/2009    History   Social History  . Marital Status: Married    Spouse Name: N/A    Number of Children: N/A  . Years of Education: N/A   Occupational History  . Not on file.   Social History Main Topics  . Smoking status: Former Smoker    Types: Cigarettes    Quit date: 08/22/1974  . Smokeless tobacco: Never Used  . Alcohol Use: No  . Drug Use: No  . Sexual Activity: Not on file   Other Topics Concern  . Not on file   Social History Narrative  . No narrative on file    Past Surgical History  Procedure Laterality Date  . Carotid artery angioplasty    . Prostate surgery      prostatectomy  . Laparoscopic appendectomy  02/20/2012    Procedure: APPENDECTOMY LAPAROSCOPIC;  Surgeon: Stark Klein, MD;  Location: [facility] OR;  Service: General;  Laterality: N/A;    Family History  Problem Relation Age of Onset  .  Hypertension Mother   . Cancer Father   . Cancer Brother     Allergies  Allergen Reactions  . Aspirin Other (See Comments)    High doses causes stomach ulcer and bleeding    Current Outpatient Prescriptions on File Prior to Visit  Medication Sig Dispense Refill  . amlodipine-atorvastatin (CADUET) 10-20 MG per tablet Take 1 tablet by mouth daily.      Marland Kitchen aspirin 81 MG tablet Take 81 mg by mouth daily.        . clopidogrel (PLAVIX) 75 MG tablet TAKE 1 TABLET BY MOUTH ONCE DAILY  100 tablet  1  . FREESTYLE LITE test strip USE AS DIRECTED  100 each  4  . glipiZIDE (GLUCOTROL XL) 5 MG 24 hr tablet Take 5 mg by mouth daily.      Marland Kitchen glucose blood test strip 1 each by Other route daily as needed.      Marland Kitchen losartan (COZAAR) 100 MG tablet Take 100 mg by mouth daily.      . metFORMIN (GLUCOPHAGE XR) 500 MG 24 hr tablet Take 1 tablet (500 mg total) by mouth daily with breakfast.  90 tablet  1  . NEXIUM 40 MG capsule TAKE ONE CAPSULE EVERY DAY  90 capsule  3  . potassium chloride SA (KLOR-CON M20) 20 MEQ tablet TAKE 1 TABLET BY MOUTH EVERY DAY  90 tablet  1  . Pramoxine-HC (HYDROCORTISONE ACE-PRAMOXINE) 2.5-1 %  CREA USE AS DIRECTED  28.4 g  1   No current facility-administered medications on file prior to visit.    BP 160/80  Pulse 74  Temp(Src) 98 F (36.7 C) (Oral)  Resp 20  Wt 193 lb (87.544 kg)  SpO2 97%     Review of Systems  Constitutional: Negative for fever, chills, appetite change and fatigue.  HENT: Negative for congestion, dental problem, ear pain, hearing loss, sore throat, tinnitus, trouble swallowing and voice change.   Eyes: Negative for pain, discharge and visual disturbance.  Respiratory: Negative for cough, chest tightness, wheezing and stridor.   Cardiovascular: Negative for chest pain, palpitations and leg swelling.  Gastrointestinal: Negative for nausea, vomiting, abdominal pain, diarrhea, constipation, blood in stool and abdominal distention.  Genitourinary:  Negative for urgency, hematuria, flank pain, discharge, difficulty urinating and genital sores.  Musculoskeletal: Negative for arthralgias, back pain, gait problem, joint swelling, myalgias and neck stiffness.  Skin: Negative for rash.  Neurological: Negative for dizziness, syncope, speech difficulty, weakness, numbness and headaches.  Hematological: Negative for adenopathy. Does not bruise/bleed easily.  Psychiatric/Behavioral: Negative for behavioral problems and dysphoric mood. The patient is not nervous/anxious.        Objective:   Physical Exam  Constitutional: He is oriented to person, place, and time. He appears well-developed.  Blood pressure 150/76 both arms  HENT:  Head: Normocephalic.  Right Ear: External ear normal.  Left Ear: External ear normal.  Eyes: Conjunctivae and EOM are normal.  Neck: Normal range of motion.  Cardiovascular: Normal rate and normal heart sounds.   Pulmonary/Chest: Breath sounds normal.  Abdominal: Bowel sounds are normal.  Musculoskeletal: Normal range of motion. He exhibits no edema and no tenderness.  Neurological: He is alert and oriented to person, place, and time.  Psychiatric: He has a normal mood and affect. His behavior is normal.          Assessment & Plan:   Diabetes mellitus. We'll check a hemoglobin A1c Hypertension Dyslipidemia Cerebrovascular disease  Schedule CPX

## 2013-07-15 ENCOUNTER — Other Ambulatory Visit: Payer: Self-pay | Admitting: Internal Medicine

## 2013-08-25 ENCOUNTER — Other Ambulatory Visit: Payer: Self-pay | Admitting: Internal Medicine

## 2013-08-26 ENCOUNTER — Other Ambulatory Visit: Payer: Self-pay | Admitting: Internal Medicine

## 2013-08-31 ENCOUNTER — Other Ambulatory Visit: Payer: Self-pay | Admitting: Internal Medicine

## 2013-10-01 ENCOUNTER — Other Ambulatory Visit (INDEPENDENT_AMBULATORY_CARE_PROVIDER_SITE_OTHER): Payer: Medicare Other

## 2013-10-01 DIAGNOSIS — Z8546 Personal history of malignant neoplasm of prostate: Secondary | ICD-10-CM

## 2013-10-01 DIAGNOSIS — I10 Essential (primary) hypertension: Secondary | ICD-10-CM

## 2013-10-01 DIAGNOSIS — E119 Type 2 diabetes mellitus without complications: Secondary | ICD-10-CM

## 2013-10-01 DIAGNOSIS — Z0000 Encounter for general adult medical examination without abnormal findings: Secondary | ICD-10-CM

## 2013-10-01 DIAGNOSIS — N289 Disorder of kidney and ureter, unspecified: Secondary | ICD-10-CM

## 2013-10-01 DIAGNOSIS — E785 Hyperlipidemia, unspecified: Secondary | ICD-10-CM

## 2013-10-01 LAB — HEPATIC FUNCTION PANEL
ALT: 11 U/L (ref 0–53)
AST: 12 U/L (ref 0–37)
Albumin: 3.8 g/dL (ref 3.5–5.2)
Alkaline Phosphatase: 69 U/L (ref 39–117)
BILIRUBIN DIRECT: 0.1 mg/dL (ref 0.0–0.3)
BILIRUBIN TOTAL: 0.9 mg/dL (ref 0.3–1.2)
Total Protein: 7 g/dL (ref 6.0–8.3)

## 2013-10-01 LAB — BASIC METABOLIC PANEL
BUN: 19 mg/dL (ref 6–23)
CALCIUM: 9 mg/dL (ref 8.4–10.5)
CO2: 28 meq/L (ref 19–32)
CREATININE: 1.4 mg/dL (ref 0.4–1.5)
Chloride: 107 mEq/L (ref 96–112)
GFR: 63.19 mL/min (ref >60.00)
Glucose, Bld: 104 mg/dL — ABNORMAL HIGH (ref 70–99)
Potassium: 3.3 mEq/L — ABNORMAL LOW (ref 3.5–5.1)
Sodium: 142 mEq/L (ref 135–145)

## 2013-10-01 LAB — POCT URINALYSIS DIPSTICK
BILIRUBIN UA: NEGATIVE
GLUCOSE UA: NEGATIVE
KETONES UA: NEGATIVE
Leukocytes, UA: NEGATIVE
Nitrite, UA: NEGATIVE
SPEC GRAV UA: 1.025
Urobilinogen, UA: 0.2
pH, UA: 7

## 2013-10-01 LAB — PSA: PSA: 0.16 ng/mL (ref 0.10–4.00)

## 2013-10-01 LAB — HEMOGLOBIN A1C: HEMOGLOBIN A1C: 6.5 % (ref 4.6–6.5)

## 2013-10-01 LAB — CBC WITH DIFFERENTIAL/PLATELET
BASOS ABS: 0 10*3/uL (ref 0.0–0.1)
Basophils Relative: 0.4 % (ref 0.0–3.0)
EOS ABS: 0.1 10*3/uL (ref 0.0–0.7)
Eosinophils Relative: 1.6 % (ref 0.0–5.0)
HCT: 43.3 % (ref 39.0–52.0)
Hemoglobin: 14 g/dL (ref 13.0–17.0)
Lymphocytes Relative: 20.6 % (ref 12.0–46.0)
Lymphs Abs: 1.8 10*3/uL (ref 0.7–4.0)
MCHC: 32.4 g/dL (ref 30.0–36.0)
MCV: 84.9 fl (ref 78.0–100.0)
MONO ABS: 0.5 10*3/uL (ref 0.1–1.0)
Monocytes Relative: 5.5 % (ref 3.0–12.0)
NEUTROS PCT: 71.9 % (ref 43.0–77.0)
Neutro Abs: 6.1 10*3/uL (ref 1.4–7.7)
PLATELETS: 272 10*3/uL (ref 150.0–400.0)
RBC: 5.1 Mil/uL (ref 4.22–5.81)
RDW: 13.7 % (ref 11.5–14.6)
WBC: 8.5 10*3/uL (ref 4.5–10.5)

## 2013-10-01 LAB — LIPID PANEL
Cholesterol: 189 mg/dL (ref 0–200)
HDL: 37.7 mg/dL — ABNORMAL LOW (ref >39.00)
LDL Cholesterol: 129 mg/dL — ABNORMAL HIGH (ref 0–99)
TRIGLYCERIDES: 112 mg/dL (ref 0.0–149.0)
Total CHOL/HDL Ratio: 5
VLDL: 22.4 mg/dL (ref 0.0–40.0)

## 2013-10-01 LAB — TSH: TSH: 1.27 u[IU]/mL (ref 0.35–5.50)

## 2013-10-01 LAB — MICROALBUMIN / CREATININE URINE RATIO
Creatinine,U: 132.9 mg/dL
MICROALB/CREAT RATIO: 190.2 mg/g — AB (ref 0.0–30.0)
Microalb, Ur: 252.8 mg/dL — ABNORMAL HIGH (ref 0.0–1.9)

## 2013-10-10 ENCOUNTER — Encounter: Payer: Self-pay | Admitting: Internal Medicine

## 2013-10-10 ENCOUNTER — Other Ambulatory Visit: Payer: Self-pay | Admitting: Neurosurgery

## 2013-10-10 ENCOUNTER — Ambulatory Visit (INDEPENDENT_AMBULATORY_CARE_PROVIDER_SITE_OTHER): Payer: Medicare Other | Admitting: Internal Medicine

## 2013-10-10 VITALS — BP 150/72 | HR 67 | Temp 98.7°F | Resp 20 | Ht 67.25 in | Wt 191.0 lb

## 2013-10-10 DIAGNOSIS — N289 Disorder of kidney and ureter, unspecified: Secondary | ICD-10-CM

## 2013-10-10 DIAGNOSIS — E785 Hyperlipidemia, unspecified: Secondary | ICD-10-CM

## 2013-10-10 DIAGNOSIS — I6529 Occlusion and stenosis of unspecified carotid artery: Secondary | ICD-10-CM

## 2013-10-10 DIAGNOSIS — E119 Type 2 diabetes mellitus without complications: Secondary | ICD-10-CM

## 2013-10-10 DIAGNOSIS — Z48812 Encounter for surgical aftercare following surgery on the circulatory system: Secondary | ICD-10-CM

## 2013-10-10 DIAGNOSIS — Z0000 Encounter for general adult medical examination without abnormal findings: Secondary | ICD-10-CM

## 2013-10-10 DIAGNOSIS — Z8546 Personal history of malignant neoplasm of prostate: Secondary | ICD-10-CM

## 2013-10-10 DIAGNOSIS — I10 Essential (primary) hypertension: Secondary | ICD-10-CM

## 2013-10-10 NOTE — Patient Instructions (Signed)
Limit your sodium (Salt) intake    It is important that you exercise regularly, at least 20 minutes 3 to 4 times per week.  If you develop chest pain or shortness of breath seek  medical attention.   Please check your hemoglobin A1c every 3 months   

## 2013-10-10 NOTE — Progress Notes (Signed)
Subjective:    Patient ID: Austin [name], male    DOB: 1935-08-19, 79 y.o.   MRN: [id number]  HPI   History of Present Illness:   79 year-old patient who is seen today for a wellness exam. He is followed by urology cardiology, as well as vascular surgery. He has a history of carotid artery stenosis and is scheduled for a carotid artery Doppler study on 10/24/2013. Marland Kitchen He has treated hypertension, dyslipidemia, and type 2 diabetes. He has chronic kidney disease.   He is followed by cardiology and ophthalmology. He has had a recent eye exam and is also followed by a retinal specialist. Last colonoscopy approximately 6  and half years ago  Here for Medicare AWV:   1. Risk factors based on Past M, S, F history: cardiovascular risk factors include hypertension, diabetes, and dyslipidemia  2. Physical Activities: remains fairly active.  Walks daily 3. Depression/mood: no history of depression or mood disorder  4. Hearing: no hearing deficits  5. ADL's: independent in all aspects of daily living  6. Fall Risk: low  7. Home Safety: no problems identified  8. Height, weight, &visual acuity:height and weight stable. No change in visual acuity  9. Counseling: heart healthy diet, exercise, weight loss. All encouraged  10. Labs ordered based on risk factors: laboratory profile will be reviewed including hemoglobin A1c and lipid profile  11. Referral Coordination- follow-up urology next month as planned  12. Care Plan- heart healthy diet, weight loss encouraged  13. Cognitive Assessment- alert and oriented, with normal affect. No cognitive dysfunction   Allergies (verified):  No Known Drug Allergies   Past History:  Past Medical History:   Hyperlipidemia  Hypertension  Prostate cancer, hx of; status post radical prostatectomy at [facility], 1993  Cerebrovascular disease status post right carotid angioplasty in 2001  iron deficiency anemia  Diabetes mellitus, type II  CKD (creatinine 1.6)  right knee  pain   Past Surgical History:  Carotid angioplasty 01  Prostatectomy 93  colonoscopy 2008    Family History:   father died at 93 of a mesothelioma. mother is 61 has hypertension and history of anemia and 5 brothers two deceased one from prostate cancer and one from coronary artery disease; positive for diabetes  two sisters positive for diabetes   Social History:   Married  retired from [employer] since [place]: Negative for fever, chills, activity change, appetite change and fatigue.  HENT: Negative for congestion, dental problem, ear pain, hearing loss, mouth sores, rhinorrhea, sinus pressure, sneezing, tinnitus, trouble swallowing and voice change.   Eyes: Negative for photophobia, pain, redness and visual disturbance.  Respiratory: Negative for apnea, cough, choking, chest tightness, shortness of breath and wheezing.   Cardiovascular: Negative for chest pain, palpitations and leg swelling.  Gastrointestinal: Negative for nausea, vomiting, abdominal pain, diarrhea, constipation, blood in stool, abdominal distention, anal bleeding and rectal pain.  Genitourinary: Negative for dysuria, urgency, frequency, hematuria, flank pain, decreased urine volume, discharge, penile swelling, scrotal swelling, difficulty urinating, genital sores and testicular pain.  Musculoskeletal: Negative for arthralgias, back pain, gait problem, joint swelling, myalgias, neck pain and neck stiffness.  Skin: Negative for color change, rash and wound.  Neurological: Negative for dizziness, tremors, seizures, syncope, facial asymmetry, speech difficulty, weakness, light-headedness, numbness and headaches.  Hematological: Negative for adenopathy. Does not bruise/bleed easily.  Psychiatric/Behavioral: Negative for suicidal ideas, hallucinations, behavioral problems, confusion, sleep disturbance, self-injury, dysphoric mood, decreased concentration and agitation.  The  patient is not nervous/anxious.        Objective:   Physical Exam  Constitutional: He appears well-developed and well-nourished.  HENT:  Head: Normocephalic and atraumatic.  Right Ear: External ear normal.  Left Ear: External ear normal.  Nose: Nose normal.  Mouth/Throat: Oropharynx is clear and moist.  Eyes: Conjunctivae and EOM are normal. Pupils are equal, round, and reactive to light. No scleral icterus.  Neck: Normal range of motion. Neck supple. No JVD present. No thyromegaly present.  Cardiovascular: Regular rhythm, normal heart sounds and intact distal pulses.  Exam reveals no gallop and no friction rub.   No murmur heard. Pulmonary/Chest: Effort normal and breath sounds normal. He exhibits no tenderness.  Abdominal: Soft. Bowel sounds are normal. He exhibits no distension and no mass. There is no tenderness.  Lower midline scar  Genitourinary: Prostate normal and penis normal.  Musculoskeletal: Normal range of motion. He exhibits no edema and no tenderness.  Lymphadenopathy:    He has no cervical adenopathy.  Neurological: He is alert. He has normal reflexes. No cranial nerve deficit. Coordination normal.  Skin: Skin is warm and dry. No rash noted.  Psychiatric: He has a normal mood and affect. His behavior is normal.          Assessment & Plan:    Preventive health examination Diabetes mellitus type 2. We'll check a hemoglobin A1c weight loss exercise encouraged Dyslipidemia. We'll check a lipid profile Chronic kidney disease and this is will be checked urine for microalbumin will also be checked Hypertension stable

## 2013-10-10 NOTE — Progress Notes (Signed)
Pre-visit discussion using our clinic review tool. No additional management support is needed unless otherwise documented below in the visit note.  

## 2013-10-11 ENCOUNTER — Telehealth: Payer: Self-pay

## 2013-10-11 ENCOUNTER — Telehealth: Payer: Self-pay | Admitting: Internal Medicine

## 2013-10-11 NOTE — Telephone Encounter (Signed)
Relevant patient education assigned to patient using Emmi. ° °

## 2013-10-23 ENCOUNTER — Encounter: Payer: Self-pay | Admitting: Family

## 2013-10-24 ENCOUNTER — Encounter: Payer: Self-pay | Admitting: Family

## 2013-10-24 ENCOUNTER — Ambulatory Visit (HOSPITAL_COMMUNITY)
Admission: RE | Admit: 2013-10-24 | Discharge: 2013-10-24 | Disposition: A | Discharge status: Home / Self Care | Payer: Medicare Other | Source: Ambulatory Visit | Attending: Family | Admitting: Family

## 2013-10-24 ENCOUNTER — Ambulatory Visit (INDEPENDENT_AMBULATORY_CARE_PROVIDER_SITE_OTHER): Payer: Medicare Other | Admitting: Family

## 2013-10-24 VITALS — BP 164/81 | HR 64 | Resp 16 | Ht 67.0 in | Wt 190.0 lb

## 2013-10-24 DIAGNOSIS — Z48812 Encounter for surgical aftercare following surgery on the circulatory system: Secondary | ICD-10-CM

## 2013-10-24 DIAGNOSIS — I6529 Occlusion and stenosis of unspecified carotid artery: Secondary | ICD-10-CM

## 2013-10-24 DIAGNOSIS — Z4889 Encounter for other specified surgical aftercare: Secondary | ICD-10-CM | POA: Insufficient documentation

## 2013-10-24 NOTE — Patient Instructions (Signed)

## 2013-10-24 NOTE — Progress Notes (Signed)
Established Carotid Patient   History of Present Illness  Austin [name] is a 79 y.o. male patient of Dr. Oneida Alar status post a right carotid angioplasty in October 2001. He returns today for routine surveillance.  He had a right occular event that he states was not a stroke, but denies monocular loss of vision.  The patient  denies facial drooping.  Pt. denies hemiplegia.  The patient denies receptive or expressive aphasia.  Pt. denies extremity weakness. He denies claudication symptoms in legs with walking.   Patient denies New Medical or Surgical History.  Pt Diabetic: Yes, 6.5 A1C three weeks ago, in control Pt smoker: former smoker, quit in 1976  Pt meds include: Statin : Yes ASA: Yes Other anticoagulants/antiplatelets: Plavix   Past Medical History  Diagnosis Date  . 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 CAROTID ARTERY STENOSIS 05/10/2010  . DIABETES MELLITUS, TYPE II 09/19/2007  . DISEASE, CEREBROVASCULAR NEC 03/05/2007  . GERD 03/13/2007  . HYPERLIPIDEMIA 03/05/2007  . HYPERTENSION 03/05/2007  . HYPOKALEMIA 11/09/2009  . KNEE PAIN, RIGHT 11/09/2009  . PROSTATE CANCER, HX OF 03/05/2007  . RENAL DISEASE, CHRONIC 02/03/2009    Social History History  Substance Use Topics  . Smoking status: Former Smoker    Types: Cigarettes    Quit date: 08/22/1974  . Smokeless tobacco: Never Used  . Alcohol Use: No    Family History Family History  Problem Relation Age of Onset  . Hypertension Mother   . Cancer Father   . Cancer Brother     Surgical History Past Surgical History  Procedure Laterality Date  . Carotid artery angioplasty Right Oct. 10, 2001  . Laparoscopic appendectomy  02/20/2012    Procedure: APPENDECTOMY LAPAROSCOPIC;  Surgeon: Stark Klein, MD;  Location: [facility];  Service: General;  Laterality: N/A;  . Appendectomy  02-20-12  . Prostate surgery      prostatectomy    Allergies  Allergen Reactions  . Aspirin Other (See Comments)    High doses causes  stomach ulcer and bleeding    Current Outpatient Prescriptions  Medication Sig Dispense Refill  . amlodipine-atorvastatin (CADUET) 10-20 MG per tablet TAKE 1 TABLET DAILY  90 tablet  1  . aspirin 81 MG tablet Take 81 mg by mouth daily.        . clopidogrel (PLAVIX) 75 MG tablet TAKE 1 TABLET BY MOUTH ONCE DAILY  100 tablet  0  . FREESTYLE LITE test strip USE AS DIRECTED  100 each  4  . glipiZIDE (GLUCOTROL XL) 5 MG 24 hr tablet TAKE 1 TABLET BY MOUTH EVERY DAY  90 tablet  1  . glucose blood test strip 1 each by Other route daily as needed.      Marland Kitchen losartan (COZAAR) 100 MG tablet TAKE 1 TABLET BY MOUTH EVERY DAY  90 tablet  1  . metFORMIN (GLUCOPHAGE-XR) 500 MG 24 hr tablet TAKE 1 TABLET BY MOUTH EVERY DAY WITH BREAKFAST  90 tablet  0  . metoprolol succinate (TOPROL-XL) 25 MG 24 hr tablet Take 25 mg by mouth daily.       Marland Kitchen NEXIUM 40 MG capsule TAKE ONE CAPSULE EVERY DAY  90 capsule  3  . potassium chloride SA (KLOR-CON M20) 20 MEQ tablet TAKE 1 TABLET BY MOUTH EVERY DAY  90 tablet  1  . Pramoxine-HC (HYDROCORTISONE ACE-PRAMOXINE) 2.5-1 % CREA USE AS DIRECTED  28.4 g  1   No current facility-administered medications for this visit.    Review  of Systems : See HPI for pertinent positives and negatives.  Physical Examination  Filed Vitals:   10/24/13 1127  BP: 164/81  Pulse: 64  Resp: 16   Filed Weights   10/24/13 1127  Weight: 190 lb (86.183 kg)   Body mass index is 29.75 kg/(m^2).   General: WDWN male in NAD GAIT: normal Eyes: PERRLA Pulmonary:  Non-labored, CTAB, Negative  Rales, Negative rhonchi, & Negative wheezing.  Cardiac: regular Rhythm ,  Negative detected murmur.  VASCULAR EXAM Carotid Bruits Left Right   Negative Negative     Radial pulses are 3+ palpable and equal.                                                                                                                            LE Pulses LEFT RIGHT       POPLITEAL  not palpable   not palpable        POSTERIOR TIBIAL   palpable    palpable        DORSALIS PEDIS      ANTERIOR TIBIAL not palpable  Not palpable     Gastrointestinal: soft, nontender, BS WNL, no r/g,  negative masses.  Musculoskeletal: Negative muscle atrophy/wasting. M/S 5/5 throughout, Extremities without ischemic changes.2  Neurologic: A&O X 3; Appropriate Affect ; SENSATION ;normal;  Speech is normal CN 2-12 intact, Pain and light touch intact in extremities, Motor exam as listed above.   Non-Invasive Vascular Imaging CAROTID DUPLEX 10/24/2013   CEREBROVASCULAR DUPLEX EVALUATION    INDICATION: Follow-up carotid artery disease     PREVIOUS INTERVENTION(S): Right carotid angioplasty 05/31/2000    DUPLEX EXAM:     RIGHT  LEFT  Peak Systolic Velocities (cm/s) End Diastolic Velocities (cm/s) Plaque LOCATION Peak Systolic Velocities (cm/s) End Diastolic Velocities (cm/s) Plaque  154 15  CCA PROXIMAL 101 13   86 14  CCA MID 85 15   69 12  CCA DISTAL 72 14   120 13  ECA 102 10   49 9  ICA PROXIMAL 39 9   41 12  ICA MID 65 21   43 12  ICA DISTAL 84 26     .71 ICA / CCA Ratio (PSV) .54  Antegrade  Vertebral Flow Antegrade   0000000 Brachial Systolic Pressure (mmHg) 0000000  Within normal limits  Brachial Artery Waveforms Within normal limits     Plaque Morphology:  HM = Homogeneous, HT = Heterogeneous, CP = Calcific Plaque, SP = Smooth Plaque, IP = Irregular Plaque     ADDITIONAL FINDINGS:     IMPRESSION: 1. Evidence of minimal (< 40%) plaquing of the bilateral internal carotid artery. 2. Bilateral vertebral artery is antegrade.    Compared to the previous exam:  No significant change compared to prior exam.     Assessment: Austin [name] is a 79 y.o. male who presents status post right carotid angioplasty in October 2001 with asymptomatic minimal bilateral ICA stenosis. The  ICA  stenosis is  Unchanged from previous exam.  Plan: Follow-up in 1 year with Carotid Duplex scan.   I discussed in depth with the  patient the nature of atherosclerosis, and emphasized the importance of maximal medical management including strict control of blood pressure, blood glucose, and lipid levels, obtaining regular exercise, and continued cessation of smoking.  The patient is aware that without maximal medical management the underlying atherosclerotic disease process will progress, limiting the benefit of any interventions. The patient was given information about stroke prevention and what symptoms should prompt the patient to seek immediate medical care. Thank you for allowing [place] to participate in this patient's care.  Clemon Chambers, RN, MSN, FNP-C [facility] Office: [facility] Physician: Oneida Alar  10/24/2013 11:44 AM

## 2013-10-31 ENCOUNTER — Other Ambulatory Visit: Payer: Self-pay | Admitting: *Deleted

## 2013-10-31 DIAGNOSIS — I6529 Occlusion and stenosis of unspecified carotid artery: Secondary | ICD-10-CM

## 2013-11-26 ENCOUNTER — Other Ambulatory Visit: Payer: Self-pay | Admitting: Internal Medicine

## 2013-12-25 ENCOUNTER — Ambulatory Visit (INDEPENDENT_AMBULATORY_CARE_PROVIDER_SITE_OTHER): Payer: Medicare Other | Admitting: Internal Medicine

## 2013-12-25 ENCOUNTER — Encounter: Payer: Self-pay | Admitting: Internal Medicine

## 2013-12-25 VITALS — BP 124/78 | HR 66 | Temp 98.4°F | Resp 20 | Ht 67.0 in | Wt 188.0 lb

## 2013-12-25 DIAGNOSIS — E119 Type 2 diabetes mellitus without complications: Secondary | ICD-10-CM

## 2013-12-25 DIAGNOSIS — N289 Disorder of kidney and ureter, unspecified: Secondary | ICD-10-CM

## 2013-12-25 DIAGNOSIS — I10 Essential (primary) hypertension: Secondary | ICD-10-CM

## 2013-12-25 NOTE — Patient Instructions (Signed)
Limit your sodium (Salt) intake   Please check your hemoglobin A1c every 3 months  Return in 3 months for follow-up   

## 2013-12-25 NOTE — Progress Notes (Signed)
Pre-visit discussion using our clinic review tool. No additional management support is needed unless otherwise documented below in the visit note.  

## 2013-12-26 ENCOUNTER — Encounter: Payer: Self-pay | Admitting: Internal Medicine

## 2013-12-26 NOTE — Progress Notes (Signed)
Subjective:    Patient ID: Austin [name], male    DOB: 10/29/1934, 79 y.o.   MRN: [id number]  HPI 79 year old patient who is seen today for followup.  He has type 2 diabetes controlled on oral medications.  He has dyslipidemia, hypertension, and a history of carotid artery disease.  Doing quite well.  His only complaint today is some skin and soft tissue infections involving his lower nominal wall at the belt line.  This has largely resolved.  He has gastroesophageal reflux disease, which has been stable.  He has had a recent followup with vascular surgery for carotid artery stenosis  Past Medical History  Diagnosis Date  . 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 CAROTID ARTERY STENOSIS 05/10/2010  . DIABETES MELLITUS, TYPE II 09/19/2007  . DISEASE, CEREBROVASCULAR NEC 03/05/2007  . GERD 03/13/2007  . HYPERLIPIDEMIA 03/05/2007  . HYPERTENSION 03/05/2007  . HYPOKALEMIA 11/09/2009  . KNEE PAIN, RIGHT 11/09/2009  . PROSTATE CANCER, HX OF 03/05/2007  . RENAL DISEASE, CHRONIC 02/03/2009    History   Social History  . Marital Status: Married    Spouse Name: N/A    Number of Children: N/A  . Years of Education: N/A   Occupational History  . Not on file.   Social History Main Topics  . Smoking status: Former Smoker    Types: Cigarettes    Quit date: 08/22/1974  . Smokeless tobacco: Never Used  . Alcohol Use: No  . Drug Use: No  . Sexual Activity: Not on file   Other Topics Concern  . Not on file   Social History Narrative  . No narrative on file    Past Surgical History  Procedure Laterality Date  . Carotid artery angioplasty Right Oct. 10, 2001  . Laparoscopic appendectomy  02/20/2012    Procedure: APPENDECTOMY LAPAROSCOPIC;  Surgeon: Stark Klein, MD;  Location: [facility];  Service: General;  Laterality: N/A;  . Appendectomy  02-20-12  . Prostate surgery      prostatectomy    Family History  Problem Relation Age of Onset  . Hypertension Mother   . Cancer Father   .  Cancer Brother     Allergies  Allergen Reactions  . Aspirin Other (See Comments)    High doses causes stomach ulcer and bleeding    Current Outpatient Prescriptions on File Prior to Visit  Medication Sig Dispense Refill  . amlodipine-atorvastatin (CADUET) 10-20 MG per tablet TAKE 1 TABLET DAILY  90 tablet  1  . aspirin 81 MG tablet Take 81 mg by mouth daily.        . clopidogrel (PLAVIX) 75 MG tablet TAKE 1 TABLET BY MOUTH ONCE DAILY  100 tablet  0  . FREESTYLE LITE test strip USE AS DIRECTED  100 each  4  . glipiZIDE (GLUCOTROL XL) 5 MG 24 hr tablet TAKE 1 TABLET BY MOUTH EVERY DAY  90 tablet  1  . glucose blood test strip 1 each by Other route daily as needed.      . hydrocortisone-pramoxine (ANALPRAM-HC) 2.5-1 % rectal cream USE AS DIRECTED  30 g  2  . losartan (COZAAR) 100 MG tablet TAKE 1 TABLET BY MOUTH EVERY DAY  90 tablet  1  . metFORMIN (GLUCOPHAGE-XR) 500 MG 24 hr tablet TAKE 1 TABLET EVERY DAY WITH BREAKFAST  90 tablet  2  . metoprolol succinate (TOPROL-XL) 25 MG 24 hr tablet Take 25 mg by mouth daily.       Marland Kitchen NEXIUM  40 MG capsule TAKE ONE CAPSULE EVERY DAY  90 capsule  3  . potassium chloride SA (KLOR-CON M20) 20 MEQ tablet TAKE 1 TABLET BY MOUTH EVERY DAY  90 tablet  1   No current facility-administered medications on file prior to visit.    BP 124/78  Pulse 66  Temp(Src) 98.4 F (36.9 C) (Oral)  Resp 20  Ht 5\' 7"  (1.702 m)  Wt 188 lb (85.276 kg)  BMI 29.44 kg/m2  SpO2 98%  Lab Results  Component Value Date   HGBA1C 6.5 10/01/2013     Review of Systems  Constitutional: Negative for fever, chills, appetite change and fatigue.  HENT: Negative for congestion, dental problem, ear pain, hearing loss, sore throat, tinnitus, trouble swallowing and voice change.   Eyes: Negative for pain, discharge and visual disturbance.  Respiratory: Negative for cough, chest tightness, wheezing and stridor.   Cardiovascular: Negative for chest pain, palpitations and leg  swelling.  Gastrointestinal: Negative for nausea, vomiting, abdominal pain, diarrhea, constipation, blood in stool and abdominal distention.  Genitourinary: Negative for urgency, hematuria, flank pain, discharge, difficulty urinating and genital sores.  Musculoskeletal: Negative for arthralgias, back pain, gait problem, joint swelling, myalgias and neck stiffness.  Skin: Positive for rash.  Neurological: Negative for dizziness, syncope, speech difficulty, weakness, numbness and headaches.  Hematological: Negative for adenopathy. Does not bruise/bleed easily.  Psychiatric/Behavioral: Negative for behavioral problems and dysphoric mood. The patient is not nervous/anxious.        Objective:   Physical Exam  Constitutional: He is oriented to person, place, and time. He appears well-developed.  Blood pressure 134/82  HENT:  Head: Normocephalic.  Right Ear: External ear normal.  Left Ear: External ear normal.  Eyes: Conjunctivae and EOM are normal.  Neck: Normal range of motion.  Cardiovascular: Normal rate and normal heart sounds.   Pulmonary/Chest: Breath sounds normal.  Abdominal: Bowel sounds are normal.  Musculoskeletal: Normal range of motion. He exhibits no edema and no tenderness.  Neurological: He is alert and oriented to person, place, and time.  Skin:  Resolving small sebaceous cyst, lower bowel wall with some postinflammatory hyperpigmentation.  No significant infection  Psychiatric: He has a normal mood and affect. His behavior is normal.         Assessment & Plan:   Diabetes mellitus.  Well controlled.  We'll continue present regimen of metformin and glipizide Hypertension stable Carotid artery stenosis

## 2014-01-02 ENCOUNTER — Telehealth: Payer: Self-pay

## 2014-01-02 NOTE — Telephone Encounter (Signed)
Relevant patient education assigned to patient using Emmi. ° °

## 2014-01-07 ENCOUNTER — Ambulatory Visit: Payer: Medicare Other | Admitting: Internal Medicine

## 2014-01-15 ENCOUNTER — Other Ambulatory Visit: Payer: Self-pay | Admitting: Internal Medicine

## 2014-02-03 ENCOUNTER — Other Ambulatory Visit: Payer: Self-pay | Admitting: Internal Medicine

## 2014-02-10 LAB — HM DIABETES EYE EXAM

## 2014-02-14 ENCOUNTER — Other Ambulatory Visit: Payer: Self-pay | Admitting: Internal Medicine

## 2014-02-28 ENCOUNTER — Other Ambulatory Visit: Payer: Self-pay | Admitting: Internal Medicine

## 2014-03-27 ENCOUNTER — Encounter: Payer: Self-pay | Admitting: Internal Medicine

## 2014-03-27 ENCOUNTER — Ambulatory Visit (INDEPENDENT_AMBULATORY_CARE_PROVIDER_SITE_OTHER): Payer: Medicare Other | Admitting: Internal Medicine

## 2014-03-27 VITALS — BP 130/80 | HR 61 | Temp 98.3°F | Resp 20 | Ht 67.0 in | Wt 188.0 lb

## 2014-03-27 DIAGNOSIS — I10 Essential (primary) hypertension: Secondary | ICD-10-CM

## 2014-03-27 DIAGNOSIS — E785 Hyperlipidemia, unspecified: Secondary | ICD-10-CM

## 2014-03-27 DIAGNOSIS — E119 Type 2 diabetes mellitus without complications: Secondary | ICD-10-CM

## 2014-03-27 DIAGNOSIS — I6529 Occlusion and stenosis of unspecified carotid artery: Secondary | ICD-10-CM

## 2014-03-27 DIAGNOSIS — N289 Disorder of kidney and ureter, unspecified: Secondary | ICD-10-CM

## 2014-03-27 LAB — HEMOGLOBIN A1C: Hgb A1c MFr Bld: 6.5 % (ref 4.6–6.5)

## 2014-03-27 MED ORDER — GLUCOSE BLOOD VI STRP: 1.0000 | ORAL_STRIP | Freq: Every day | Status: DC | PRN

## 2014-03-27 NOTE — Patient Instructions (Signed)
Limit your sodium (Salt) intake    It is important that you exercise regularly, at least 20 minutes 3 to 4 times per week.  If you develop chest pain or shortness of breath seek  medical attention.  You need to lose weight.  Consider a lower calorie diet and regular exercise. 

## 2014-03-27 NOTE — Progress Notes (Signed)
Subjective:    Patient ID: Austin [name], male    DOB: 01-Mar-1935, 79 y.o.   MRN: [id number]  HPI 79 year old patient who is in today for followup.  He has hypertension, dyslipidemia, and type 2 diabetes.  Last hemoglobin A1c 6 point 5. He is scheduled to see a retinal specialist later this month.  He is doing quite well.  No concerns or complaints He has maintained good glycemic control No cardiopulmonary complaints. He does have a history of carotid artery stenosis.  Denies any focal neurological symptoms.  He did have a followup carotid or Doppler study in March of this year  Past Medical History  Diagnosis Date  . 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 CAROTID ARTERY STENOSIS 05/10/2010  . DIABETES MELLITUS, TYPE II 09/19/2007  . DISEASE, CEREBROVASCULAR NEC 03/05/2007  . GERD 03/13/2007  . HYPERLIPIDEMIA 03/05/2007  . HYPERTENSION 03/05/2007  . HYPOKALEMIA 11/09/2009  . KNEE PAIN, RIGHT 11/09/2009  . PROSTATE CANCER, HX OF 03/05/2007  . RENAL DISEASE, CHRONIC 02/03/2009    History   Social History  . Marital Status: Married    Spouse Name: N/A    Number of Children: N/A  . Years of Education: N/A   Occupational History  . Not on file.   Social History Main Topics  . Smoking status: Former Smoker    Types: Cigarettes    Quit date: 08/22/1974  . Smokeless tobacco: Never Used  . Alcohol Use: No  . Drug Use: No  . Sexual Activity: Not on file   Other Topics Concern  . Not on file   Social History Narrative  . No narrative on file    Past Surgical History  Procedure Laterality Date  . Carotid artery angioplasty Right Oct. 10, 2001  . Laparoscopic appendectomy  02/20/2012    Procedure: APPENDECTOMY LAPAROSCOPIC;  Surgeon: Stark Klein, MD;  Location: [facility];  Service: General;  Laterality: N/A;  . Appendectomy  02-20-12  . Prostate surgery      prostatectomy    Family History  Problem Relation Age of Onset  . Hypertension Mother   . Cancer Father   . Cancer  Brother     Allergies  Allergen Reactions  . Aspirin Other (See Comments)    High doses causes stomach ulcer and bleeding    Current Outpatient Prescriptions on File Prior to Visit  Medication Sig Dispense Refill  . amlodipine-atorvastatin (CADUET) 10-20 MG per tablet TAKE 1 TABLET BY MOUTH EVERY DAY  90 tablet  0  . aspirin 81 MG tablet Take 81 mg by mouth daily.        . clopidogrel (PLAVIX) 75 MG tablet TAKE 1 TABLET BY MOUTH ONCE DAILY  100 tablet  0  . clopidogrel (PLAVIX) 75 MG tablet TAKE 1 TABLET BY MOUTH EVERY DAY  100 tablet  0  . glipiZIDE (GLUCOTROL XL) 5 MG 24 hr tablet TAKE 1 TABLET EVEYR DAY  90 tablet  2  . hydrocortisone-pramoxine (ANALPRAM-HC) 2.5-1 % rectal cream USE AS DIRECTED  30 g  2  . KLOR-CON M20 20 MEQ tablet TAKE 1 TABLET BY MOUTH EVERY DAY  100 tablet  0  . losartan (COZAAR) 100 MG tablet TAKE 1 TABLET BY MOUTH EVERY DAY  90 tablet  0  . metFORMIN (GLUCOPHAGE-XR) 500 MG 24 hr tablet TAKE 1 TABLET EVERY DAY WITH BREAKFAST  90 tablet  2  . metoprolol succinate (TOPROL-XL) 25 MG 24 hr tablet Take 25 mg by mouth daily.       Marland Kitchen  NEXIUM 40 MG capsule TAKE ONE CAPSULE EVERY DAY  90 capsule  3   No current facility-administered medications on file prior to visit.    BP 130/80  Pulse 61  Temp(Src) 98.3 F (36.8 C) (Oral)  Resp 20  Ht 5\' 7"  (1.702 m)  Wt 188 lb (85.276 kg)  BMI 29.44 kg/m2  SpO2 98%     Review of Systems  Constitutional: Negative for fever, chills, appetite change and fatigue.  HENT: Negative for congestion, dental problem, ear pain, hearing loss, sore throat, tinnitus, trouble swallowing and voice change.   Eyes: Negative for pain, discharge and visual disturbance.  Respiratory: Negative for cough, chest tightness, wheezing and stridor.   Cardiovascular: Negative for chest pain, palpitations and leg swelling.  Gastrointestinal: Negative for nausea, vomiting, abdominal pain, diarrhea, constipation, blood in stool and abdominal  distention.  Genitourinary: Negative for urgency, hematuria, flank pain, discharge, difficulty urinating and genital sores.  Musculoskeletal: Negative for arthralgias, back pain, gait problem, joint swelling, myalgias and neck stiffness.  Skin: Negative for rash.  Neurological: Negative for dizziness, syncope, speech difficulty, weakness, numbness and headaches.  Hematological: Negative for adenopathy. Does not bruise/bleed easily.  Psychiatric/Behavioral: Negative for behavioral problems and dysphoric mood. The patient is not nervous/anxious.        Objective:   Physical Exam  Constitutional: He is oriented to person, place, and time. He appears well-developed.  Repeat blood pressure 130/80  HENT:  Head: Normocephalic.  Right Ear: External ear normal.  Left Ear: External ear normal.  Eyes: Conjunctivae and EOM are normal.  Neck: Normal range of motion.  Cardiovascular: Normal rate and normal heart sounds.   Pulmonary/Chest: Breath sounds normal.  Abdominal: Bowel sounds are normal.  Musculoskeletal: Normal range of motion. He exhibits no edema and no tenderness.  Neurological: He is alert and oriented to person, place, and time.  Psychiatric: He has a normal mood and affect. His behavior is normal.          Assessment & Plan:  Diabetes mellitus.  We'll check a hemoglobin A 1 C.  Hypertension well controlled  Carotid artery stenosis, stable  Dyslipidemia.  Continue statin therapy   Modest weight loss encouraged  Recheck 4 months

## 2014-03-27 NOTE — Progress Notes (Signed)
   Subjective:    Patient ID: Austin [name], male    DOB: [birth year], 79 y.o.   MRN: [id number]  HPI Wt Readings from Last 3 Encounters:  03/27/14 188 lb (85.276 kg)  12/25/13 188 lb (85.276 kg)  10/24/13 190 lb (86.183 kg)     Review of Systems     Objective:   Physical Exam        Assessment & Plan:

## 2014-03-27 NOTE — Progress Notes (Signed)
Pre visit review using our clinic review tool, if applicable. No additional management support is needed unless otherwise documented below in the visit note. 

## 2014-04-14 ENCOUNTER — Other Ambulatory Visit: Payer: Self-pay | Admitting: Internal Medicine

## 2014-04-20 ENCOUNTER — Other Ambulatory Visit: Payer: Self-pay | Admitting: Internal Medicine

## 2014-04-21 ENCOUNTER — Encounter: Payer: Self-pay | Admitting: Internal Medicine

## 2014-05-12 ENCOUNTER — Other Ambulatory Visit: Payer: Self-pay | Admitting: Internal Medicine

## 2014-06-01 ENCOUNTER — Other Ambulatory Visit: Payer: Self-pay | Admitting: Internal Medicine

## 2014-06-25 ENCOUNTER — Ambulatory Visit: Payer: Medicare Other

## 2014-06-25 ENCOUNTER — Ambulatory Visit (INDEPENDENT_AMBULATORY_CARE_PROVIDER_SITE_OTHER): Payer: Medicare Other | Admitting: *Deleted

## 2014-06-25 DIAGNOSIS — Z23 Encounter for immunization: Secondary | ICD-10-CM

## 2014-07-16 ENCOUNTER — Other Ambulatory Visit: Payer: Self-pay | Admitting: Internal Medicine

## 2014-07-22 ENCOUNTER — Ambulatory Visit (INDEPENDENT_AMBULATORY_CARE_PROVIDER_SITE_OTHER): Payer: Medicare Other | Admitting: Internal Medicine

## 2014-07-22 ENCOUNTER — Encounter: Payer: Self-pay | Admitting: Internal Medicine

## 2014-07-22 VITALS — BP 160/80 | HR 66 | Temp 98.0°F | Resp 20 | Ht 67.0 in | Wt 190.0 lb

## 2014-07-22 DIAGNOSIS — I6529 Occlusion and stenosis of unspecified carotid artery: Secondary | ICD-10-CM

## 2014-07-22 DIAGNOSIS — I10 Essential (primary) hypertension: Secondary | ICD-10-CM

## 2014-07-22 DIAGNOSIS — E785 Hyperlipidemia, unspecified: Secondary | ICD-10-CM

## 2014-07-22 DIAGNOSIS — E0821 Diabetes mellitus due to underlying condition with diabetic nephropathy: Secondary | ICD-10-CM

## 2014-07-22 LAB — HEMOGLOBIN A1C: Hgb A1c MFr Bld: 6.7 % — ABNORMAL HIGH (ref 4.6–6.5)

## 2014-07-22 NOTE — Progress Notes (Signed)
Subjective:    Patient ID: Austin [name], male    DOB: 08/31/1934, 79 y.o.   MRN: [id number]  HPI 79 year old patient seen today for general follow-up.  He has a history of type 2 diabetes which has been quite well controlled.  Last hemoglobin A1c 6.5 He has hypertension.  Recently diuretic therapy was added to his regimen.  He continues to do well.  He has a history of carotid artery disease and is scheduled for follow-up.  Carotid artery Doppler studies early next year. He has dyslipidemia, mild chronic kidney disease. Denies any cardiopulmonary complaints.  Denies any focal neurological symptoms.  Past Medical History  Diagnosis Date  . 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 CAROTID ARTERY STENOSIS 05/10/2010  . DIABETES MELLITUS, TYPE II 09/19/2007  . DISEASE, CEREBROVASCULAR NEC 03/05/2007  . GERD 03/13/2007  . HYPERLIPIDEMIA 03/05/2007  . HYPERTENSION 03/05/2007  . HYPOKALEMIA 11/09/2009  . KNEE PAIN, RIGHT 11/09/2009  . PROSTATE CANCER, HX OF 03/05/2007  . RENAL DISEASE, CHRONIC 02/03/2009    History   Social History  . Marital Status: Married    Spouse Name: N/A    Number of Children: N/A  . Years of Education: N/A   Occupational History  . Not on file.   Social History Main Topics  . Smoking status: Former Smoker    Types: Cigarettes    Quit date: 08/22/1974  . Smokeless tobacco: Never Used  . Alcohol Use: No  . Drug Use: No  . Sexual Activity: Not on file   Other Topics Concern  . Not on file   Social History Narrative    Past Surgical History  Procedure Laterality Date  . Carotid artery angioplasty Right Oct. 10, 2001  . Laparoscopic appendectomy  02/20/2012    Procedure: APPENDECTOMY LAPAROSCOPIC;  Surgeon: Stark Klein, MD;  Location: [facility];  Service: General;  Laterality: N/A;  . Appendectomy  02-20-12  . Prostate surgery      prostatectomy    Family History  Problem Relation Age of Onset  . Hypertension Mother   . Cancer Father   . Cancer  Brother     Allergies  Allergen Reactions  . Aspirin Other (See Comments)    High doses causes stomach ulcer and bleeding    Current Outpatient Prescriptions on File Prior to Visit  Medication Sig Dispense Refill  . amlodipine-atorvastatin (CADUET) 10-20 MG per tablet TAKE 1 TABLET BY MOUTH EVERY DAY 90 tablet 1  . aspirin 81 MG tablet Take 81 mg by mouth daily.      . clopidogrel (PLAVIX) 75 MG tablet TAKE 1 TABLET BY MOUTH EVERY DAY 100 tablet 0  . esomeprazole (NEXIUM) 40 MG capsule TAKE 1 CAPSULE BY MOUTH ONCE DAILY 90 capsule 3  . glipiZIDE (GLUCOTROL XL) 5 MG 24 hr tablet TAKE 1 TABLET EVEYR DAY 90 tablet 2  . glucose blood (FREESTYLE LITE) test strip 1 each by Other route daily as needed for other. 100 each 12  . hydrocortisone-pramoxine (ANALPRAM-HC) 2.5-1 % rectal cream Use once daily or as directed 30 g 2  . KLOR-CON M20 20 MEQ tablet TAKE 1 TABLET BY MOUTH EVERY DAY 100 tablet 0  . metFORMIN (GLUCOPHAGE-XR) 500 MG 24 hr tablet TAKE 1 TABLET EVERY DAY WITH BREAKFAST 90 tablet 2  . metoprolol succinate (TOPROL-XL) 25 MG 24 hr tablet Take 25 mg by mouth daily.      No current facility-administered medications on file prior to visit.    BP  160/80 mmHg  Pulse 66  Temp(Src) 98 F (36.7 C) (Oral)  Resp 20  Ht 5\' 7"  (1.702 m)  Wt 190 lb (86.183 kg)  BMI 29.75 kg/m2  SpO2 98%         Review of Systems  Constitutional: Negative for fever, chills, appetite change and fatigue.  HENT: Negative for congestion, dental problem, ear pain, hearing loss, sore throat, tinnitus, trouble swallowing and voice change.   Eyes: Negative for pain, discharge and visual disturbance.  Respiratory: Negative for cough, chest tightness, wheezing and stridor.   Cardiovascular: Negative for chest pain, palpitations and leg swelling.  Gastrointestinal: Negative for nausea, vomiting, abdominal pain, diarrhea, constipation, blood in stool and abdominal distention.  Genitourinary: Negative for  urgency, hematuria, flank pain, discharge, difficulty urinating and genital sores.  Musculoskeletal: Negative for myalgias, back pain, joint swelling, arthralgias, gait problem and neck stiffness.  Skin: Negative for rash.  Neurological: Negative for dizziness, syncope, speech difficulty, weakness, numbness and headaches.  Hematological: Negative for adenopathy. Does not bruise/bleed easily.  Psychiatric/Behavioral: Negative for behavioral problems and dysphoric mood. The patient is not nervous/anxious.        Objective:   Physical Exam  Constitutional: He is oriented to person, place, and time. He appears well-developed.  Repeat blood pressure 142/72  HENT:  Head: Normocephalic.  Right Ear: External ear normal.  Left Ear: External ear normal.  Eyes: Conjunctivae and EOM are normal.  Neck: Normal range of motion.  Cardiovascular: Normal rate and normal heart sounds.   Pulmonary/Chest: Breath sounds normal.  Abdominal: Bowel sounds are normal.  Musculoskeletal: Normal range of motion. He exhibits no edema or tenderness.  Neurological: He is alert and oriented to person, place, and time.  Psychiatric: He has a normal mood and affect. His behavior is normal.          Assessment & Plan:  Hypertension, stable Diabetes mellitus type 2.  Carotid artery stenosis.  Follow-up carotid artery Doppler ultrasound as scheduled Dyslipidemia.  Continue statin therapy  CPX 4 months

## 2014-07-22 NOTE — Progress Notes (Signed)
Pre visit review using our clinic review tool, if applicable. No additional management support is needed unless otherwise documented below in the visit note. 

## 2014-07-22 NOTE — Patient Instructions (Signed)
Limit your sodium (Salt) intake  Return in 4 months for follow-up  

## 2014-07-28 ENCOUNTER — Ambulatory Visit: Payer: Medicare Other | Admitting: Internal Medicine

## 2014-08-20 ENCOUNTER — Other Ambulatory Visit: Payer: Self-pay | Admitting: Internal Medicine

## 2014-09-09 ENCOUNTER — Other Ambulatory Visit: Payer: Self-pay | Admitting: Internal Medicine

## 2014-09-25 ENCOUNTER — Other Ambulatory Visit: Payer: Self-pay | Admitting: Internal Medicine

## 2014-09-30 ENCOUNTER — Other Ambulatory Visit: Payer: Self-pay | Admitting: *Deleted

## 2014-09-30 MED ORDER — ONETOUCH LANCETS MISC: Status: DC

## 2014-09-30 MED ORDER — GLUCOSE BLOOD VI STRP: ORAL_STRIP | Status: DC

## 2014-10-02 LAB — HM DIABETES EYE EXAM

## 2014-10-06 ENCOUNTER — Encounter: Payer: Self-pay | Admitting: Internal Medicine

## 2014-10-17 ENCOUNTER — Other Ambulatory Visit: Payer: Self-pay | Admitting: Internal Medicine

## 2014-10-30 ENCOUNTER — Ambulatory Visit: Payer: Medicare Other | Admitting: Family

## 2014-10-30 ENCOUNTER — Other Ambulatory Visit (HOSPITAL_COMMUNITY): Payer: Medicare Other

## 2014-11-05 ENCOUNTER — Encounter: Payer: Self-pay | Admitting: Family

## 2014-11-06 ENCOUNTER — Ambulatory Visit (HOSPITAL_COMMUNITY)
Admission: RE | Admit: 2014-11-06 | Discharge: 2014-11-06 | Disposition: A | Discharge status: Home / Self Care | Payer: Medicare Other | Source: Ambulatory Visit | Attending: Family | Admitting: Family

## 2014-11-06 ENCOUNTER — Other Ambulatory Visit: Payer: Self-pay

## 2014-11-06 ENCOUNTER — Ambulatory Visit (INDEPENDENT_AMBULATORY_CARE_PROVIDER_SITE_OTHER): Payer: Medicare Other | Admitting: Family

## 2014-11-06 ENCOUNTER — Encounter: Payer: Self-pay | Admitting: Family

## 2014-11-06 VITALS — BP 171/81 | HR 61 | Resp 16 | Ht 67.0 in | Wt 189.0 lb

## 2014-11-06 DIAGNOSIS — I6523 Occlusion and stenosis of bilateral carotid arteries: Secondary | ICD-10-CM | POA: Insufficient documentation

## 2014-11-06 DIAGNOSIS — Z87891 Personal history of nicotine dependence: Secondary | ICD-10-CM | POA: Diagnosis not present

## 2014-11-06 DIAGNOSIS — Z9889 Other specified postprocedural states: Secondary | ICD-10-CM

## 2014-11-06 DIAGNOSIS — Z9862 Peripheral vascular angioplasty status: Secondary | ICD-10-CM

## 2014-11-06 NOTE — Progress Notes (Signed)
Established Carotid Patient   History of Present Illness  Austin [name] is a 80 y.o. male patient of Dr. Oneida Alar who is status post a right carotid intracranial angioplasty in October 2001 by Dr. Estanislado Pandy. He returns today for routine surveillance.  He had a right occular event that he states was not a stroke, may have been a TIA but denies monocular loss of vision.  The patient denies facial drooping. Pt. denies hemiplegia. The patient denies receptive or expressive aphasia. Pt. denies extremity weakness. He denies claudication symptoms in legs with walking.  Patient denies New Medical or Surgical History.  Pt Diabetic: Yes, 6.7 A1C recently, in control Pt smoker: former smoker, quit in 1976  Pt meds include: Statin : Yes ASA: Yes Other anticoagulants/antiplatelets: Plavix    Past Medical History  Diagnosis Date  . 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 CAROTID ARTERY STENOSIS 05/10/2010  . DIABETES MELLITUS, TYPE II 09/19/2007  . DISEASE, CEREBROVASCULAR NEC 03/05/2007  . GERD 03/13/2007  . HYPERLIPIDEMIA 03/05/2007  . HYPERTENSION 03/05/2007  . HYPOKALEMIA 11/09/2009  . KNEE PAIN, RIGHT 11/09/2009  . PROSTATE CANCER, HX OF 03/05/2007  . RENAL DISEASE, CHRONIC 02/03/2009    Social History History  Substance Use Topics  . Smoking status: Former Smoker    Types: Cigarettes    Quit date: 08/22/1974  . Smokeless tobacco: Never Used  . Alcohol Use: No    Family History Family History  Problem Relation Age of Onset  . Hypertension Mother   . Cancer Father   . Cancer Brother     Surgical History Past Surgical History  Procedure Laterality Date  . Carotid artery angioplasty Right Oct. 10, 2001  . Laparoscopic appendectomy  02/20/2012    Procedure: APPENDECTOMY LAPAROSCOPIC;  Surgeon: Stark Klein, MD;  Location: [facility];  Service: General;  Laterality: N/A;  . Appendectomy  02-20-12  . Prostate surgery      prostatectomy    Allergies  Allergen Reactions  .  Aspirin Other (See Comments)    High doses causes stomach ulcer and bleeding    Current Outpatient Prescriptions  Medication Sig Dispense Refill  . amlodipine-atorvastatin (CADUET) 10-20 MG per tablet TAKE 1 TABLET BY MOUTH EVERY DAY 90 tablet 1  . aspirin 81 MG tablet Take 81 mg by mouth daily.      . clopidogrel (PLAVIX) 75 MG tablet TAKE 1 TABLET BY MOUTH EVERY DAY 100 tablet 0  . esomeprazole (NEXIUM) 40 MG capsule TAKE 1 CAPSULE BY MOUTH ONCE DAILY 90 capsule 3  . glipiZIDE (GLUCOTROL XL) 5 MG 24 hr tablet TAKE 1 TABLET EVEYR DAY 90 tablet 2  . glucose blood (ONETOUCH VERIO) test strip USE TO CHECK BLOOD SUGAR DAILY AND PRN 100 each 4  . hydrocortisone-pramoxine (ANALPRAM-HC) 2.5-1 % rectal cream Use once daily or as directed 30 g 2  . KLOR-CON M20 20 MEQ tablet TAKE 1 TABLET BY MOUTH EVERY DAY 100 tablet 1  . losartan-hydrochlorothiazide (HYZAAR) 100-12.5 MG per tablet Take 1 tablet by mouth daily.  3  . metFORMIN (GLUCOPHAGE-XR) 500 MG 24 hr tablet TAKE 1 TABLET EVERY DAY WITH BREAKFAST 90 tablet 2  . metoprolol succinate (TOPROL-XL) 25 MG 24 hr tablet Take 25 mg by mouth daily.     . ONE TOUCH LANCETS MISC USE TO CHECK BLOOD SUGAR DAILY AND PRN 200 each 2   No current facility-administered medications for this visit.    Review of Systems : See HPI for pertinent positives and negatives.  Physical Examination  Filed Vitals:   11/06/14 1326 11/06/14 1327  BP: 171/81   Pulse: 66 61  Resp: 16   Height: 5\' 7"  (1.702 m)   Weight: 189 lb (85.73 kg)    Body mass index is 29.59 kg/(m^2).  General: WDWN male in NAD, lipoma behind left ear GAIT: normal Eyes: PERRLA Pulmonary: Non-labored, CTAB, Negative Rales, Negative rhonchi, & Negative wheezing.  Cardiac: regular Rhythm, no detected murmur.  VASCULAR EXAM Carotid Bruits Left Right   Negative Negative    Radial pulses are 3+ palpable and equal.       LE Pulses LEFT RIGHT   POPLITEAL not palpable  not palpable   POSTERIOR TIBIAL  palpable   palpable    DORSALIS PEDIS  ANTERIOR TIBIAL  palpable   palpable     Gastrointestinal: soft, nontender, BS WNL, no r/g, no palpable masses.  Musculoskeletal: Negative muscle atrophy/wasting. M/S 5/5 throughout, Extremities without ischemic changes.2  Neurologic: A&O X 3; Appropriate Affect,   Speech is normal CN 2-12 intact, Pain and light touch intact in extremities, Motor exam as listed above.         Non-Invasive Vascular Imaging CAROTID DUPLEX 11/06/2014   CEREBROVASCULAR DUPLEX EVALUATION    INDICATION: Carotid artery disease    PREVIOUS INTERVENTION(S): Right carotid angioplasty 05/31/2000    DUPLEX EXAM: Carotid duplex    RIGHT  LEFT  Peak Systolic Velocities (cm/s) End Diastolic Velocities (cm/s) Plaque LOCATION Peak Systolic Velocities (cm/s) End Diastolic Velocities (cm/s) Plaque  118 10 - CCA PROXIMAL 92 15 -  81 11 - CCA MID 78 5 HT  57 7 - CCA DISTAL 58 12 -  85 8 - ECA 80 9 HT  33 9 - ICA PROXIMAL 48 13 -  45 15 - ICA MID 63 19 -  39 9 - ICA DISTAL 65 19 -    N/A ICA / CCA Ratio (PSV) .83  Antegrade Vertebral Flow Antegrade  [id number] Brachial Systolic Pressure (mmHg) [id number]  Triphasic Brachial Artery Waveforms Triphasic    Plaque Morphology:  HM = Homogeneous, HT = Heterogeneous, CP = Calcific Plaque, SP = Smooth Plaque, IP = Irregular Plaque  ADDITIONAL FINDINGS:     IMPRESSION: 1. Less than 40% bilateral internal carotid artery stenosis    Compared to the previous exam:  No change      Assessment: Austin [name] is a 80 y.o. male who is status post a right intracranial carotid angioplasty in October 2001. He has no history of stroke, did have possible TIA in 2001 prior to the intracranial carotid angioplasty. The  ICA  stenosis is  Unchanged from previous Duplex.  Plan: Follow-up in 1 year with Carotid Duplex.   I discussed in depth with the patient the nature of atherosclerosis, and emphasized the importance of maximal medical management including strict control of blood pressure, blood glucose, and lipid levels, obtaining regular exercise, and continued cessation of smoking.  The patient is aware that without maximal medical management the underlying atherosclerotic disease process will progress, limiting the benefit of any interventions. The patient was given information about stroke prevention and what symptoms should prompt the patient to seek immediate medical care. Thank you for allowing [place] to participate in this patient's care.  Clemon Chambers, RN, MSN, FNP-C [facility] Office: [phone]  Clinic Physician: Oneida Alar on call  11/06/2014 1:29 PM

## 2014-11-06 NOTE — Addendum Note (Signed)
Addended by: Reola Calkins on: 11/06/2014 03:27 PM   Modules accepted: Orders

## 2014-11-06 NOTE — Patient Instructions (Signed)
Stroke Prevention Some medical conditions and behaviors are associated with an increased chance of having a stroke. You may prevent a stroke by making healthy choices and managing medical conditions. HOW CAN I REDUCE MY RISK OF HAVING A STROKE?   Stay physically active. Get at least 30 minutes of activity on most or all days.  Do not smoke. It may also be helpful to avoid exposure to secondhand smoke.  Limit alcohol use. Moderate alcohol use is considered to be:  No more than 2 drinks per day for men.  No more than 1 drink per day for nonpregnant women.  Eat healthy foods. This involves:  Eating 5 or more servings of fruits and vegetables a day.  Making dietary changes that address high blood pressure (hypertension), high cholesterol, diabetes, or obesity.  Manage your cholesterol levels.  Making food choices that are high in fiber and low in saturated fat, trans fat, and cholesterol may control cholesterol levels.  Take any prescribed medicines to control cholesterol as directed by your health care provider.  Manage your diabetes.  Controlling your carbohydrate and sugar intake is recommended to manage diabetes.  Take any prescribed medicines to control diabetes as directed by your health care provider.  Control your hypertension.  Making food choices that are low in salt (sodium), saturated fat, trans fat, and cholesterol is recommended to manage hypertension.  Take any prescribed medicines to control hypertension as directed by your health care provider.  Maintain a healthy weight.  Reducing calorie intake and making food choices that are low in sodium, saturated fat, trans fat, and cholesterol are recommended to manage weight.  Stop drug abuse.  Avoid taking birth control pills.  Talk to your health care provider about the risks of taking birth control pills if you are over 35 years old, smoke, get migraines, or have ever had a blood clot.  Get evaluated for sleep  disorders (sleep apnea).  Talk to your health care provider about getting a sleep evaluation if you snore a lot or have excessive sleepiness.  Take medicines only as directed by your health care provider.  For some people, aspirin or blood thinners (anticoagulants) are helpful in reducing the risk of forming abnormal blood clots that can lead to stroke. If you have the irregular heart rhythm of atrial fibrillation, you should be on a blood thinner unless there is a good reason you cannot take them.  Understand all your medicine instructions.  Make sure that other conditions (such as anemia or atherosclerosis) are addressed. SEEK IMMEDIATE MEDICAL CARE IF:   You have sudden weakness or numbness of the face, arm, or leg, especially on one side of the body.  Your face or eyelid droops to one side.  You have sudden confusion.  You have trouble speaking (aphasia) or understanding.  You have sudden trouble seeing in one or both eyes.  You have sudden trouble walking.  You have dizziness.  You have a loss of balance or coordination.  You have a sudden, severe headache with no known cause.  You have new chest pain or an irregular heartbeat. Any of these symptoms may represent a serious problem that is an emergency. Do not wait to see if the symptoms will go away. Get medical help at once. Call your local emergency services (911 in [place] Do not drive yourself to the hospital. Document Released: 09/15/2004 Document Revised: 12/23/2013 Document Reviewed: 02/08/2013 ExitCare Patient Information 2015 [employer]. This information is not intended to replace advice given   to you by your health care provider. Make sure you discuss any questions you have with your health care provider.  

## 2014-11-18 ENCOUNTER — Other Ambulatory Visit (INDEPENDENT_AMBULATORY_CARE_PROVIDER_SITE_OTHER): Payer: Medicare Other

## 2014-11-18 DIAGNOSIS — E785 Hyperlipidemia, unspecified: Secondary | ICD-10-CM

## 2014-11-18 DIAGNOSIS — I10 Essential (primary) hypertension: Secondary | ICD-10-CM | POA: Diagnosis not present

## 2014-11-18 DIAGNOSIS — Z0000 Encounter for general adult medical examination without abnormal findings: Secondary | ICD-10-CM

## 2014-11-18 LAB — COMPREHENSIVE METABOLIC PANEL
ALT: 5 U/L (ref 0–53)
AST: 10 U/L (ref 0–37)
Albumin: 4.1 g/dL (ref 3.5–5.2)
Alkaline Phosphatase: 65 U/L (ref 39–117)
BUN: 34 mg/dL — ABNORMAL HIGH (ref 6–23)
CO2: 30 mEq/L (ref 19–32)
Calcium: 10.2 mg/dL (ref 8.4–10.5)
Chloride: 101 mEq/L (ref 96–112)
Creatinine, Ser: 1.85 mg/dL — ABNORMAL HIGH (ref 0.40–1.50)
GFR: 45.68 mL/min — ABNORMAL LOW (ref >60.00)
Glucose, Bld: 98 mg/dL (ref 70–99)
Potassium: 3.1 mEq/L — ABNORMAL LOW (ref 3.5–5.1)
SODIUM: 139 meq/L (ref 135–145)
Total Bilirubin: 0.5 mg/dL (ref 0.2–1.2)
Total Protein: 7.2 g/dL (ref 6.0–8.3)

## 2014-11-18 LAB — LIPID PANEL
CHOL/HDL RATIO: 4
Cholesterol: 153 mg/dL (ref 0–200)
HDL: 35.5 mg/dL — ABNORMAL LOW (ref >39.00)
LDL CALC: 94 mg/dL (ref 0–99)
NonHDL: 117.5
Triglycerides: 118 mg/dL (ref 0.0–149.0)
VLDL: 23.6 mg/dL (ref 0.0–40.0)

## 2014-11-18 LAB — CBC WITH DIFFERENTIAL/PLATELET
BASOS PCT: 0.5 % (ref 0.0–3.0)
Basophils Absolute: 0 10*3/uL (ref 0.0–0.1)
EOS ABS: 0.2 10*3/uL (ref 0.0–0.7)
Eosinophils Relative: 1.8 % (ref 0.0–5.0)
HCT: 38.3 % — ABNORMAL LOW (ref 39.0–52.0)
HEMOGLOBIN: 13 g/dL (ref 13.0–17.0)
Lymphocytes Relative: 15.9 % (ref 12.0–46.0)
Lymphs Abs: 1.6 10*3/uL (ref 0.7–4.0)
MCHC: 33.9 g/dL (ref 30.0–36.0)
MCV: 81 fl (ref 78.0–100.0)
MONOS PCT: 4.8 % (ref 3.0–12.0)
Monocytes Absolute: 0.5 10*3/uL (ref 0.1–1.0)
NEUTROS ABS: 7.6 10*3/uL (ref 1.4–7.7)
NEUTROS PCT: 77 % (ref 43.0–77.0)
Platelets: 327 10*3/uL (ref 150.0–400.0)
RBC: 4.72 Mil/uL (ref 4.22–5.81)
RDW: 13.4 % (ref 11.5–15.5)
WBC: 9.9 10*3/uL (ref 4.0–10.5)

## 2014-11-18 LAB — MICROALBUMIN / CREATININE URINE RATIO
CREATININE, U: 151.1 mg/dL
MICROALB UR: 130.3 mg/dL — AB (ref 0.0–1.9)
Microalb Creat Ratio: 86.2 mg/g — ABNORMAL HIGH (ref 0.0–30.0)

## 2014-11-18 LAB — POCT URINALYSIS DIPSTICK
BILIRUBIN UA: NEGATIVE
Glucose, UA: NEGATIVE
KETONES UA: NEGATIVE
Leukocytes, UA: NEGATIVE
NITRITE UA: NEGATIVE
PH UA: 6
Spec Grav, UA: 1.015
Urobilinogen, UA: 0.2

## 2014-11-18 LAB — TSH: TSH: 1.09 u[IU]/mL (ref 0.35–4.50)

## 2014-11-18 LAB — HEMOGLOBIN A1C: Hgb A1c MFr Bld: 6.9 % — ABNORMAL HIGH (ref 4.6–6.5)

## 2014-11-18 LAB — PSA: PSA: 0.28 ng/mL (ref 0.10–4.00)

## 2014-11-25 ENCOUNTER — Encounter: Payer: Self-pay | Admitting: Internal Medicine

## 2014-11-25 ENCOUNTER — Other Ambulatory Visit: Payer: Self-pay | Admitting: *Deleted

## 2014-11-25 ENCOUNTER — Ambulatory Visit (INDEPENDENT_AMBULATORY_CARE_PROVIDER_SITE_OTHER): Payer: Medicare Other | Admitting: Internal Medicine

## 2014-11-25 DIAGNOSIS — Z0000 Encounter for general adult medical examination without abnormal findings: Secondary | ICD-10-CM

## 2014-11-25 DIAGNOSIS — I6529 Occlusion and stenosis of unspecified carotid artery: Secondary | ICD-10-CM

## 2014-11-25 DIAGNOSIS — Z23 Encounter for immunization: Secondary | ICD-10-CM | POA: Diagnosis not present

## 2014-11-25 DIAGNOSIS — N289 Disorder of kidney and ureter, unspecified: Secondary | ICD-10-CM

## 2014-11-25 DIAGNOSIS — I6523 Occlusion and stenosis of bilateral carotid arteries: Secondary | ICD-10-CM

## 2014-11-25 DIAGNOSIS — E785 Hyperlipidemia, unspecified: Secondary | ICD-10-CM

## 2014-11-25 DIAGNOSIS — E0821 Diabetes mellitus due to underlying condition with diabetic nephropathy: Secondary | ICD-10-CM

## 2014-11-25 DIAGNOSIS — I10 Essential (primary) hypertension: Secondary | ICD-10-CM

## 2014-11-25 MED ORDER — GLIPIZIDE ER 5 MG PO TB24: ORAL_TABLET | ORAL | Status: DC

## 2014-11-25 MED ORDER — CLOPIDOGREL BISULFATE 75 MG PO TABS: 75.0000 mg | ORAL_TABLET | Freq: Every day | ORAL | Status: DC

## 2014-11-25 NOTE — Progress Notes (Signed)
Pre visit review using our clinic review tool, if applicable. No additional management support is needed unless otherwise documented below in the visit note. 

## 2014-11-25 NOTE — Progress Notes (Signed)
Subjective:    Patient ID: Austin [name], male    DOB: December 08, 1934, 80 y.o.   MRN: [id number]  HPI   History of Present Illness:   80  year-old patient who is seen today for a wellness exam.  He is followed by urology cardiology, as well as vascular surgery. He has a history of carotid artery stenosis .  Carotid artery Doppler study 11/06/2014 revealed less than 40% ICA stenosis bilaterally He has treated hypertension, dyslipidemia, and type 2 diabetes. He has chronic kidney disease.  His most recent creatinine up to 1.85 with creatinine clearance 46.  He remains on a submaximal dose of metformin therapy.  Hemoglobin A1c 6.9  He is followed by cardiology and ophthalmology. He has had a recent eye exam and is also followed by a retinal specialist. Last colonoscopy approximately 7  and half years ago  Here for Medicare AWV:   1. Risk factors based on Past M, S, F history: cardiovascular risk factors include hypertension, diabetes, and dyslipidemia  2. Physical Activities: remains fairly active.  Walks daily 3. Depression/mood: no history of depression or mood disorder  4. Hearing: no hearing deficits  5. ADL's: independent in all aspects of daily living  6. Fall Risk: low  7. Home Safety: no problems identified  8. Height, weight, &visual acuity:height and weight stable. No change in visual acuity.  He is followed by ophthalmology and a retinal specialist at least annually  9. Counseling: heart healthy diet, exercise, weight loss. All encouraged  10. Labs ordered based on risk factors: laboratory profile will be reviewed including hemoglobin A1c and lipid profile  11. Referral Coordination- follow-up urology next month as planned  12. Care Plan- heart healthy diet, weight loss encouraged  13. Cognitive Assessment- alert and oriented, with normal affect. No cognitive dysfunction  14.  Preventive services will include annual health examination with screening lab.  He will continue to have annual  urology and ophthalmology appointments.   Patient was provided with a written and personalized care plan 15.  Provider list updated.  Includes primary care urology, cardiology, vascular surgery ophthalmology   Allergies (verified):  No Known Drug Allergies   Past History:  Past Medical History:   Hyperlipidemia  Hypertension  Prostate cancer, hx of; status post radical prostatectomy at [facility], 1993  Cerebrovascular disease status post right carotid angioplasty in 2001  iron deficiency anemia  Diabetes mellitus, type II  CKD (creatinine 1.6)  right knee pain   Past Surgical History:  Carotid angioplasty 01  Prostatectomy 93  colonoscopy 2008    Family History:   father died at 8 of a mesothelioma. mother is 44 has hypertension and history of anemia and 5 brothers two deceased one from prostate cancer and one from coronary artery disease; positive for diabetes  two sisters positive for diabetes   Social History:   Married  retired from [employer] since [place]: Negative for fever, chills, activity change, appetite change and fatigue.  HENT: Negative for congestion, dental problem, ear pain, hearing loss, mouth sores, rhinorrhea, sinus pressure, sneezing, tinnitus, trouble swallowing and voice change.   Eyes: Negative for photophobia, pain, redness and visual disturbance.  Respiratory: Negative for apnea, cough, choking, chest tightness, shortness of breath and wheezing.   Cardiovascular: Negative for chest pain, palpitations and leg swelling.  Gastrointestinal: Negative for nausea, vomiting, abdominal pain, diarrhea, constipation, blood in stool, abdominal distention, anal bleeding and rectal pain.  Genitourinary: Negative for  dysuria, urgency, frequency, hematuria, flank pain, decreased urine volume, discharge, penile swelling, scrotal swelling, difficulty urinating, genital sores and testicular pain.  Musculoskeletal: Negative for  myalgias, back pain, joint swelling, arthralgias, gait problem, neck pain and neck stiffness.  Skin: Negative for color change, rash and wound.  Neurological: Negative for dizziness, tremors, seizures, syncope, facial asymmetry, speech difficulty, weakness, light-headedness, numbness and headaches.  Hematological: Negative for adenopathy. Does not bruise/bleed easily.  Psychiatric/Behavioral: Negative for suicidal ideas, hallucinations, behavioral problems, confusion, sleep disturbance, self-injury, dysphoric mood, decreased concentration and agitation. The patient is not nervous/anxious.        Objective:   Physical Exam  Constitutional: He appears well-developed and well-nourished.  HENT:  Head: Normocephalic and atraumatic.  Right Ear: External ear normal.  Left Ear: External ear normal.  Nose: Nose normal.  Mouth/Throat: Oropharynx is clear and moist.  Eyes: Conjunctivae and EOM are normal. Pupils are equal, round, and reactive to light. No scleral icterus.  Neck: Normal range of motion. Neck supple. No JVD present. No thyromegaly present.  Cardiovascular: Regular rhythm, normal heart sounds and intact distal pulses.  Exam reveals no gallop and no friction rub.   No murmur heard. Pulmonary/Chest: Effort normal and breath sounds normal. He exhibits no tenderness.  Abdominal: Soft. Bowel sounds are normal. He exhibits no distension and no mass. There is no tenderness.  Lower midline scar  Genitourinary: Prostate normal and penis normal.  Musculoskeletal: Normal range of motion. He exhibits no edema or tenderness.  Lymphadenopathy:    He has no cervical adenopathy.  Neurological: He is alert. He has normal reflexes. No cranial nerve deficit. Coordination normal.  Skin: Skin is warm and dry. No rash noted.  Psychiatric: He has a normal mood and affect. His behavior is normal.          Assessment & Plan:    Preventive health examination Diabetes mellitus type 2. We'll check a  hemoglobin A1c weight loss exercise encouraged Dyslipidemia. We'll check a lipid profile Chronic kidney diseas.  Creatinine now up to 1.85.  Will recheck in 3 months.  If there is any clinical worsening.  Will discontinue metformin therapy and referred to nephrology.  Hypertension stable.  Continue aggressive multi drug treatment Hypokalemia.  Compliance with daily potassium supplementation.  Encouraged

## 2014-11-25 NOTE — Patient Instructions (Addendum)
Limit your sodium (Salt) intake  Take your potassium supplement daily    It is important that you exercise regularly, at least 20 minutes 3 to 4 times per week.  If you develop chest pain or shortness of breath seek  medical attention.  Return in 3 months for follow-up   Health Maintenance A healthy lifestyle and preventative care can promote health and wellness.  Maintain regular health, dental, and eye exams.  Eat a healthy diet. Foods like vegetables, fruits, whole grains, low-fat dairy products, and lean protein foods contain the nutrients you need and are low in calories. Decrease your intake of foods high in solid fats, added sugars, and salt. Get information about a proper diet from your health care provider, if necessary.  Regular physical exercise is one of the most important things you can do for your health. Most adults should get at least 150 minutes of moderate-intensity exercise (any activity that increases your heart rate and causes you to sweat) each week. In addition, most adults need muscle-strengthening exercises on 2 or more days a week.   Maintain a healthy weight. The body mass index (BMI) is a screening tool to identify possible weight problems. It provides an estimate of body fat based on height and weight. Your health care provider can find your BMI and can help you achieve or maintain a healthy weight. For males 20 years and older:  A BMI below 18.5 is considered underweight.  A BMI of 18.5 to 24.9 is normal.  A BMI of 25 to 29.9 is considered overweight.  A BMI of 30 and above is considered obese.  Maintain normal blood lipids and cholesterol by exercising and minimizing your intake of saturated fat. Eat a balanced diet with plenty of fruits and vegetables. Blood tests for lipids and cholesterol should begin at age 63 and be repeated every 5 years. If your lipid or cholesterol levels are high, you are over age 48, or you are at high risk for heart disease, you  may need your cholesterol levels checked more frequently.Ongoing high lipid and cholesterol levels should be treated with medicines if diet and exercise are not working.  If you smoke, find out from your health care provider how to quit. If you do not use tobacco, do not start.  Lung cancer screening is recommended for adults aged 13-80 years who are at high risk for developing lung cancer because of a history of smoking. A yearly low-dose CT scan of the lungs is recommended for people who have at least a 30-pack-year history of smoking and are current smokers or have quit within the past 15 years. A pack year of smoking is smoking an average of 1 pack of cigarettes a day for 1 year (for example, a 30-pack-year history of smoking could mean smoking 1 pack a day for 30 years or 2 packs a day for 15 years). Yearly screening should continue until the smoker has stopped smoking for at least 15 years. Yearly screening should be stopped for people who develop a health problem that would prevent them from having lung cancer treatment.  If you choose to drink alcohol, do not have more than 2 drinks per day. One drink is considered to be 12 oz (360 mL) of beer, 5 oz (150 mL) of wine, or 1.5 oz (45 mL) of liquor.  Avoid the use of street drugs. Do not share needles with anyone. Ask for help if you need support or instructions about stopping the use of  drugs.  High blood pressure causes heart disease and increases the risk of stroke. Blood pressure should be checked at least every 1-2 years. Ongoing high blood pressure should be treated with medicines if weight loss and exercise are not effective.  If you are 19-13 years old, ask your health care provider if you should take aspirin to prevent heart disease.  Diabetes screening involves taking a blood sample to check your fasting blood sugar level. This should be done once every 3 years after age 75 if you are at a normal weight and without risk factors for  diabetes. Testing should be considered at a younger age or be carried out more frequently if you are overweight and have at least 1 risk factor for diabetes.  Colorectal cancer can be detected and often prevented. Most routine colorectal cancer screening begins at the age of 51 and continues through age 62. However, your health care provider may recommend screening at an earlier age if you have risk factors for colon cancer. On a yearly basis, your health care provider may provide home test kits to check for hidden blood in the stool. A small camera at the end of a tube may be used to directly examine the colon (sigmoidoscopy or colonoscopy) to detect the earliest forms of colorectal cancer. Talk to your health care provider about this at age 41 when routine screening begins. A direct exam of the colon should be repeated every 5-10 years through age 4, unless early forms of precancerous polyps or small growths are found.  People who are at an increased risk for hepatitis B should be screened for this virus. You are considered at high risk for hepatitis B if:  You were born in a country where hepatitis B occurs often. Talk with your health care provider about which countries are considered high risk.  Your parents were born in a high-risk country and you have not received a shot to protect against hepatitis B (hepatitis B vaccine).  You have HIV or AIDS.  You use needles to inject street drugs.  You live with, or have sex with, someone who has hepatitis B.  You are a man who has sex with other men (MSM).  You get hemodialysis treatment.  You take certain medicines for conditions like cancer, organ transplantation, and autoimmune conditions.  Hepatitis C blood testing is recommended for all people born from [birth year] through 1963 and any individual with known risk factors for hepatitis C.  Healthy men should no longer receive prostate-specific antigen (PSA) blood tests as part of routine cancer  screening. Talk to your health care provider about prostate cancer screening.  Testicular cancer screening is not recommended for adolescents or adult males who have no symptoms. Screening includes self-exam, a health care provider exam, and other screening tests. Consult with your health care provider about any symptoms you have or any concerns you have about testicular cancer.  Practice safe sex. Use condoms and avoid high-risk sexual practices to reduce the spread of sexually transmitted infections (STIs).  You should be screened for STIs, including gonorrhea and chlamydia if:  You are sexually active and are younger than 24 years.  You are older than 24 years, and your health care provider tells you that you are at risk for this type of infection.  Your sexual activity has changed since you were last screened, and you are at an increased risk for chlamydia or gonorrhea. Ask your health care provider if you are at risk.  If  you are at risk of being infected with HIV, it is recommended that you take a prescription medicine daily to prevent HIV infection. This is called pre-exposure prophylaxis (PrEP). You are considered at risk if:  You are a man who has sex with other men (MSM).  You are a heterosexual man who is sexually active with multiple partners.  You take drugs by injection.  You are sexually active with a partner who has HIV.  Talk with your health care provider about whether you are at high risk of being infected with HIV. If you choose to begin PrEP, you should first be tested for HIV. You should then be tested every 3 months for as long as you are taking PrEP.  Use sunscreen. Apply sunscreen liberally and repeatedly throughout the day. You should seek shade when your shadow is shorter than you. Protect yourself by wearing long sleeves, pants, a wide-brimmed hat, and sunglasses year round whenever you are outdoors.  Tell your health care provider of new moles or changes in  moles, especially if there is a change in shape or color. Also, tell your health care provider if a mole is larger than the size of a pencil eraser.  A one-time screening for abdominal aortic aneurysm (AAA) and surgical repair of large AAAs by ultrasound is recommended for men aged 39-75 years who are current or former smokers.  Stay current with your vaccines (immunizations). Document Released: 02/04/2008 Document Revised: 08/13/2013 Document Reviewed: 01/03/2011 [facility] Patient Information 2015 [place], [place]. This information is not intended to replace advice given to you by your health care provider. Make sure you discuss any questions you have with your health care provider.

## 2014-12-01 ENCOUNTER — Telehealth: Payer: Self-pay | Admitting: Internal Medicine

## 2014-12-01 NOTE — Telephone Encounter (Signed)
Pt's wife notified information requested was sent to Dr. Zenia Resides office.

## 2014-12-01 NOTE — Telephone Encounter (Signed)
[name] had a physical and blood work done recently by Dr. Raliegh Ip.  He has had a visit with Dr. Charolette Forward [phone]) who is requesting the results of his physical and blood work.  The fax number at Dr. Zenia Resides office is [phone].

## 2014-12-06 ENCOUNTER — Emergency Department (HOSPITAL_COMMUNITY)
Admission: EM | Admit: 2014-12-06 | Discharge: 2014-12-06 | Disposition: A | Discharge status: Home / Self Care | Payer: Medicare Other | Source: Home / Self Care

## 2014-12-06 ENCOUNTER — Emergency Department (INDEPENDENT_AMBULATORY_CARE_PROVIDER_SITE_OTHER): Payer: Medicare Other

## 2014-12-06 ENCOUNTER — Other Ambulatory Visit: Payer: Self-pay | Admitting: Internal Medicine

## 2014-12-06 ENCOUNTER — Encounter (HOSPITAL_COMMUNITY): Payer: Self-pay | Admitting: *Deleted

## 2014-12-06 DIAGNOSIS — W19XXXA Unspecified fall, initial encounter: Secondary | ICD-10-CM

## 2014-12-06 DIAGNOSIS — Y92099 Unspecified place in other non-institutional residence as the place of occurrence of the external cause: Secondary | ICD-10-CM | POA: Diagnosis not present

## 2014-12-06 DIAGNOSIS — R51 Headache: Secondary | ICD-10-CM | POA: Diagnosis not present

## 2014-12-06 DIAGNOSIS — Y92009 Unspecified place in unspecified non-institutional (private) residence as the place of occurrence of the external cause: Principal | ICD-10-CM

## 2014-12-06 MED ORDER — HYDROCODONE-ACETAMINOPHEN 5-325 MG PO TABS: 1.0000 | ORAL_TABLET | Freq: Four times a day (QID) | ORAL | Status: DC | PRN

## 2014-12-06 NOTE — Discharge Instructions (Signed)
Ice pack and pain medicine as needed, activity as tolerated, see your doctor if further problems.

## 2014-12-06 NOTE — ED Provider Notes (Signed)
CSN: [id number]     Arrival date & time 12/06/14  1028 History   None    Chief Complaint  Patient presents with  . Fall   (Consider location/radiation/quality/duration/timing/severity/associated sxs/prior Treatment) Patient is a 80 y.o. male presenting with fall. The history is provided by the patient.  Fall This is a new problem. The current episode started 3 to 5 hours ago (slipped rushing down carpeted stairs this am and landed on buttock nad struck head on stairs, no loc, no confusion, dizziness or vomiting, co pain tailbone.). The problem has not changed since onset.Associated symptoms include headaches. Pertinent negatives include no chest pain and no abdominal pain.    Past Medical History  Diagnosis Date  . 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 CAROTID ARTERY STENOSIS 05/10/2010  . DIABETES MELLITUS, TYPE II 09/19/2007  . DISEASE, CEREBROVASCULAR NEC 03/05/2007  . GERD 03/13/2007  . HYPERLIPIDEMIA 03/05/2007  . HYPERTENSION 03/05/2007  . HYPOKALEMIA 11/09/2009  . KNEE PAIN, RIGHT 11/09/2009  . PROSTATE CANCER, HX OF 03/05/2007  . RENAL DISEASE, CHRONIC 02/03/2009   Past Surgical History  Procedure Laterality Date  . Carotid artery angioplasty Right Oct. 10, 2001  . Laparoscopic appendectomy  02/20/2012    Procedure: APPENDECTOMY LAPAROSCOPIC;  Surgeon: Stark Klein, MD;  Location: [place];  Service: General;  Laterality: N/A;  . Appendectomy  02-20-12  . Prostate surgery      prostatectomy   Family History  Problem Relation Age of Onset  . Hypertension Mother   . Cancer Father   . Cancer Brother    History  Substance Use Topics  . Smoking status: Former Smoker    Types: Cigarettes    Quit date: 08/22/1974  . Smokeless tobacco: Never Used  . Alcohol Use: No    Review of Systems  Constitutional: Negative.   Cardiovascular: Negative for chest pain.  Gastrointestinal: Negative.  Negative for abdominal pain.  Neurological: Positive for headaches. Negative for  dizziness, syncope, speech difficulty, weakness, light-headedness and numbness.    Allergies  Aspirin  Home Medications   Prior to Admission medications   Medication Sig Start Date End Date Taking? Authorizing Provider  amlodipine-atorvastatin (CADUET) 10-20 MG per tablet TAKE 1 TABLET BY MOUTH EVERY DAY 10/17/14   Marletta Lor, MD  aspirin 81 MG tablet Take 81 mg by mouth daily.      Historical Provider, MD  clopidogrel (PLAVIX) 75 MG tablet Take 1 tablet (75 mg total) by mouth daily. 11/25/14   Marletta Lor, MD  esomeprazole (NEXIUM) 40 MG capsule TAKE 1 CAPSULE BY MOUTH ONCE DAILY 04/17/14   Marletta Lor, MD  glipiZIDE (GLUCOTROL XL) 5 MG 24 hr tablet TAKE 1 TABLET EVEYR DAY 11/25/14   Marletta Lor, MD  glucose blood [facility] VERIO) test strip USE TO CHECK BLOOD SUGAR DAILY AND PRN 09/30/14   Marletta Lor, MD  HYDROcodone-acetaminophen (NORCO/VICODIN) 5-325 MG per tablet Take 1 tablet by mouth every 6 (six) hours as needed. 12/06/14   Billy Fischer, MD  hydrocortisone-pramoxine [facility]) 2.5-1 % rectal cream Use once daily or as directed 07/16/14   Marletta Lor, MD  KLOR-CON M20 20 MEQ tablet TAKE 1 TABLET BY MOUTH EVERY DAY 09/25/14   Marletta Lor, MD  losartan-hydrochlorothiazide [facility]) 100-12.5 MG per tablet Take 1 tablet by mouth daily. 06/23/14   Historical Provider, MD  metFORMIN (GLUCOPHAGE-XR) 500 MG 24 hr tablet TAKE 1 TABLET EVERY DAY WITH BREAKFAST 08/20/14   Doretha Sou  Burnice Logan, MD  metoprolol succinate (TOPROL-XL) 25 MG 24 hr tablet Take 25 mg by mouth daily.  10/03/13   Historical Provider, MD  ONE TOUCH LANCETS MISC USE TO CHECK BLOOD SUGAR DAILY AND PRN 09/30/14   Marletta Lor, MD   BP 179/77 mmHg  Pulse 70  Temp(Src) 98.6 F (37 C) (Oral)  Resp 18  SpO2 98% Physical Exam  Constitutional: He is oriented to person, place, and time. He appears well-developed and well-nourished.  HENT:  Head: Normocephalic.  Right Ear:  External ear normal.  Left Ear: External ear normal.  Occipital soreness ,no hematoma or lac.  Eyes: EOM are normal. Pupils are equal, round, and reactive to light.  Neck: Normal range of motion. Neck supple.  Cardiovascular: Normal heart sounds.   Abdominal: Soft. Bowel sounds are normal. There is no tenderness.  Neurological: He is alert and oriented to person, place, and time. He has normal reflexes. No cranial nerve deficit. Coordination normal.  Skin: Skin is warm and dry.  Nursing note and vitals reviewed.   ED Course  Procedures (including critical care time) Labs Review Labs Reviewed - No data to display  Imaging Review Dg Sacrum/coccyx  12/06/2014   CLINICAL DATA:  Pt fell down stairs on bottom today; Pain coccyx; Pt has surgical clips from prostate surgery in 1993  EXAM: SACRUM AND COCCYX - 2+ VIEW  COMPARISON:  None.  FINDINGS: There is no evidence of fracture or other focal bone lesions.  IMPRESSION: Negative.   Electronically Signed   By: Lajean Manes M.D.   On: 12/06/2014 11:12   X-rays reviewed and report per radiologist.   MDM   1. Fall at home, initial encounter        Billy Fischer, MD 12/06/14 [phone]

## 2014-12-06 NOTE — ED Notes (Signed)
Pt  Fell  Down  Steps  This  Am      Hit  Head   And    And  Low     Back  Pain    -  Pt  Takes  plavix         -      Pt  Did  Not  Black  Out          No  Vomiting  Pt  C/o  Headache            He  Is  Awake  And  Alert           No  Vomiting         Fell on  Carpeted  Floor

## 2014-12-24 ENCOUNTER — Encounter: Payer: Self-pay | Admitting: Adult Health

## 2014-12-24 ENCOUNTER — Ambulatory Visit (INDEPENDENT_AMBULATORY_CARE_PROVIDER_SITE_OTHER): Payer: Medicare Other | Admitting: Adult Health

## 2014-12-24 VITALS — BP 144/70 | Temp 98.5°F | Wt 187.2 lb

## 2014-12-24 DIAGNOSIS — E1122 Type 2 diabetes mellitus with diabetic chronic kidney disease: Secondary | ICD-10-CM | POA: Diagnosis not present

## 2014-12-24 DIAGNOSIS — R944 Abnormal results of kidney function studies: Secondary | ICD-10-CM | POA: Diagnosis not present

## 2014-12-24 DIAGNOSIS — W19XXXD Unspecified fall, subsequent encounter: Secondary | ICD-10-CM | POA: Diagnosis not present

## 2014-12-24 DIAGNOSIS — N189 Chronic kidney disease, unspecified: Secondary | ICD-10-CM

## 2014-12-24 DIAGNOSIS — Y92099 Unspecified place in other non-institutional residence as the place of occurrence of the external cause: Secondary | ICD-10-CM

## 2014-12-24 DIAGNOSIS — Z09 Encounter for follow-up examination after completed treatment for conditions other than malignant neoplasm: Secondary | ICD-10-CM

## 2014-12-24 LAB — BASIC METABOLIC PANEL
BUN: 25 mg/dL — AB (ref 6–23)
CO2: 31 meq/L (ref 19–32)
Calcium: 9.8 mg/dL (ref 8.4–10.5)
Chloride: 101 mEq/L (ref 96–112)
Creatinine, Ser: 1.79 mg/dL — ABNORMAL HIGH (ref 0.40–1.50)
GFR: 47.43 mL/min — ABNORMAL LOW (ref >60.00)
GLUCOSE: 109 mg/dL — AB (ref 70–99)
POTASSIUM: 3.2 meq/L — AB (ref 3.5–5.1)
Sodium: 137 mEq/L (ref 135–145)

## 2014-12-24 LAB — CBC
HCT: 38.5 % — ABNORMAL LOW (ref 39.0–52.0)
HEMOGLOBIN: 12.8 g/dL — AB (ref 13.0–17.0)
MCHC: 33.3 g/dL (ref 30.0–36.0)
MCV: 82.1 fl (ref 78.0–100.0)
PLATELETS: 310 10*3/uL (ref 150.0–400.0)
RBC: 4.69 Mil/uL (ref 4.22–5.81)
RDW: 13.9 % (ref 11.5–15.5)
WBC: 7.9 10*3/uL (ref 4.0–10.5)

## 2014-12-24 NOTE — Progress Notes (Signed)
   Subjective:    Patient ID: Austin [name], male    DOB: 10-25-1934, 80 y.o.   MRN: [id number]  HPI  Patient presents to the office for UC follow up. He was seen at [facility] s/p fall down 4-5 steps. His feet went out from under him and he hit his head,middle back and butt. He continues to complain of pain in his tail bone ( which is improving). He has pain in his back and shoulder pain that he thinks is getting worsen. He is not taking any medication for the pain. Is not using heat or ice on the area.   Cardiology would like him to stop taking Metformin and has not taken it for the last two weeks. Blood sugars have been high, 140's. He was taken off it because of his GFR. Last saw cardiology two weeks ago   Denies any headaches or blurred vision.    Review of Systems  Constitutional: Negative for fever, activity change, appetite change, fatigue and unexpected weight change.  Respiratory: Negative for cough, chest tightness and shortness of breath.   Cardiovascular: Negative for chest pain, palpitations and leg swelling.  Musculoskeletal: Positive for myalgias and back pain. Negative for gait problem, neck pain and neck stiffness.  All other systems reviewed and are negative.      Objective:   Physical Exam  Constitutional: He is oriented to person, place, and time. He appears well-developed and well-nourished. No distress.  Neck: Normal range of motion.  Cardiovascular: Normal rate and regular rhythm.   Murmur (slight 2/6 systolic murmur. Followed by cardiology) heard. Pulmonary/Chest: Effort normal and breath sounds normal. No respiratory distress. He has no wheezes. He has no rales. He exhibits no tenderness.  Musculoskeletal: Normal range of motion. He exhibits no edema or tenderness.  No bruising, redness, warmth or inflammation. No pain with palpation down spine  Neurological: He is alert and oriented to person, place, and time.  Skin: Skin is warm and dry. No rash noted. He is not  diaphoretic. No erythema. No pallor.  Psychiatric: He has a normal mood and affect. His behavior is normal. Judgment and thought content normal.        Assessment & Plan:  1. Follow up - Continues to have worsening shoulder and mid back pain.  - Advised to take tylenol 500mg  Q8 hours PRN. Use heat or ice throughout the day - Does not want a muscle relaxer - Follow up as needed  2. Decreased GFR - CBC - Basic metabolic panel - Will follow up with PCP and patient after results are back - Follow up as needed  3. Type 2 diabetes mellitus with diabetic chronic kidney disease - Continue to stay off Metformin until lab results are back - CBC - Basic metabolic panel - Continue to monitor blood sugars.

## 2014-12-24 NOTE — Progress Notes (Signed)
Pre visit review using our clinic review tool, if applicable. No additional management support is needed unless otherwise documented below in the visit note. 

## 2014-12-24 NOTE — Patient Instructions (Signed)
Use 500mg  Tylenol every 8 hours. Also, use heat or ice on the painful areas throughout the day. If you are not feeling any better in another week, please let us know.   I will let you know what your labs show and what we can do about your diabetes medication.

## 2014-12-29 ENCOUNTER — Telehealth: Payer: Self-pay | Admitting: *Deleted

## 2014-12-29 NOTE — Telephone Encounter (Signed)
Pt left voicemail returning Alisha's call about lab results.

## 2015-02-06 ENCOUNTER — Other Ambulatory Visit: Payer: Self-pay | Admitting: Internal Medicine

## 2015-02-13 LAB — HM DIABETES EYE EXAM

## 2015-03-02 ENCOUNTER — Encounter: Payer: Self-pay | Admitting: Internal Medicine

## 2015-03-03 ENCOUNTER — Ambulatory Visit: Payer: Medicare Other | Admitting: Internal Medicine

## 2015-03-10 ENCOUNTER — Ambulatory Visit (INDEPENDENT_AMBULATORY_CARE_PROVIDER_SITE_OTHER): Payer: Medicare Other | Admitting: Internal Medicine

## 2015-03-10 ENCOUNTER — Encounter: Payer: Self-pay | Admitting: Internal Medicine

## 2015-03-10 VITALS — BP 140/70 | HR 62 | Temp 98.9°F | Resp 20 | Ht 67.0 in | Wt 189.0 lb

## 2015-03-10 DIAGNOSIS — I10 Essential (primary) hypertension: Secondary | ICD-10-CM | POA: Diagnosis not present

## 2015-03-10 DIAGNOSIS — E785 Hyperlipidemia, unspecified: Secondary | ICD-10-CM | POA: Diagnosis not present

## 2015-03-10 DIAGNOSIS — E0821 Diabetes mellitus due to underlying condition with diabetic nephropathy: Secondary | ICD-10-CM | POA: Diagnosis not present

## 2015-03-10 LAB — BASIC METABOLIC PANEL
BUN: 24 mg/dL — ABNORMAL HIGH (ref 6–23)
CO2: 33 meq/L — AB (ref 19–32)
CREATININE: 1.67 mg/dL — AB (ref 0.40–1.50)
Calcium: 9.8 mg/dL (ref 8.4–10.5)
Chloride: 101 mEq/L (ref 96–112)
GFR: 51.36 mL/min — ABNORMAL LOW (ref >60.00)
GLUCOSE: 115 mg/dL — AB (ref 70–99)
POTASSIUM: 3.7 meq/L (ref 3.5–5.1)
SODIUM: 140 meq/L (ref 135–145)

## 2015-03-10 LAB — HEMOGLOBIN A1C: Hgb A1c MFr Bld: 6.5 % (ref 4.6–6.5)

## 2015-03-10 NOTE — Progress Notes (Signed)
Pre visit review using our clinic review tool, if applicable. No additional management support is needed unless otherwise documented below in the visit note. 

## 2015-03-10 NOTE — Patient Instructions (Signed)
Please check your hemoglobin A1c every 3 months  Limit your sodium (Salt) intake  Please check your blood pressure on a regular basis.  If it is consistently greater than 150/90, please make an office appointment.    It is important that you exercise regularly, at least 20 minutes 3 to 4 times per week.  If you develop chest pain or shortness of breath seek  medical attention.

## 2015-03-10 NOTE — Progress Notes (Signed)
Subjective:    Patient ID: Austin [name], male    DOB: [birth year], 80 y.o.   MRN: [id number]  HPI 80 year old patient who is seen today for follow-up of type 2 diabetes.  He has chronic kidney disease essential hypertension.  He is followed by cardiology Last GFR 47-remains on metformin therapy Adjusting to the death of his son in 02/12/2023 of this year who had congenital heart disease.  Lab Results  Component Value Date   HGBA1C 6.9* 11/18/2014    Wt Readings from Last 3 Encounters:  03/10/15 189 lb (85.73 kg)  12/24/14 187 lb 3.2 oz (84.913 kg)  11/25/14 188 lb (85.276 kg)    Past Medical History  Diagnosis Date  . 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 CAROTID ARTERY STENOSIS 05/10/2010  . DIABETES MELLITUS, TYPE II 09/19/2007  . DISEASE, CEREBROVASCULAR NEC 03/05/2007  . GERD 03/13/2007  . HYPERLIPIDEMIA 03/05/2007  . HYPERTENSION 03/05/2007  . HYPOKALEMIA 11/09/2009  . KNEE PAIN, RIGHT 11/09/2009  . PROSTATE CANCER, HX OF 03/05/2007  . RENAL DISEASE, CHRONIC 02/03/2009    History   Social History  . Marital Status: Married    Spouse Name: N/A  . Number of Children: N/A  . Years of Education: N/A   Occupational History  . Not on file.   Social History Main Topics  . Smoking status: Former Smoker    Types: Cigarettes    Quit date: 08/22/1974  . Smokeless tobacco: Never Used  . Alcohol Use: No  . Drug Use: No  . Sexual Activity: Not on file   Other Topics Concern  . Not on file   Social History Narrative    Past Surgical History  Procedure Laterality Date  . Carotid artery angioplasty Right Oct. 10, 2001  . Laparoscopic appendectomy  02/20/2012    Procedure: APPENDECTOMY LAPAROSCOPIC;  Surgeon: Stark Klein, MD;  Location: [facility];  Service: General;  Laterality: N/A;  . Appendectomy  02-20-12  . Prostate surgery      prostatectomy    Family History  Problem Relation Age of Onset  . Hypertension Mother   . Cancer Father   . Cancer Brother     Allergies    Allergen Reactions  . Aspirin Other (See Comments)    High doses causes stomach ulcer and bleeding    Current Outpatient Prescriptions on File Prior to Visit  Medication Sig Dispense Refill  . amlodipine-atorvastatin (CADUET) 10-20 MG per tablet TAKE 1 TABLET BY MOUTH EVERY DAY 90 tablet 1  . aspirin 81 MG tablet Take 81 mg by mouth daily.      . clopidogrel (PLAVIX) 75 MG tablet Take 1 tablet (75 mg total) by mouth daily. 100 tablet 1  . esomeprazole (NEXIUM) 40 MG capsule TAKE 1 CAPSULE BY MOUTH ONCE DAILY 90 capsule 3  . glipiZIDE (GLUCOTROL XL) 5 MG 24 hr tablet TAKE 1 TABLET EVEYR DAY 90 tablet 3  . glucose blood (ONETOUCH VERIO) test strip USE TO CHECK BLOOD SUGAR DAILY AND PRN 100 each 4  . HYDROcodone-acetaminophen (NORCO/VICODIN) 5-325 MG per tablet Take 1 tablet by mouth every 6 (six) hours as needed. 10 tablet 0  . hydrocortisone-pramoxine (ANALPRAM-HC) 2.5-1 % rectal cream USE ONCE DAILY OR AS DIRECTED 30 g 2  . KLOR-CON M20 20 MEQ tablet TAKE 1 TABLET BY MOUTH EVERY DAY 100 tablet 1  . losartan-hydrochlorothiazide (HYZAAR) 100-12.5 MG per tablet Take 1 tablet by mouth daily.  3  . metFORMIN (GLUCOPHAGE-XR) 500 MG 24  hr tablet TAKE 1 TABLET EVERY DAY WITH BREAKFAST 90 tablet 2  . metoprolol succinate (TOPROL-XL) 25 MG 24 hr tablet Take 25 mg by mouth daily.     . ONE TOUCH LANCETS MISC USE TO CHECK BLOOD SUGAR DAILY AND PRN 200 each 2   No current facility-administered medications on file prior to visit.    BP 140/70 mmHg  Pulse 62  Temp(Src) 98.9 F (37.2 C) (Oral)  Resp 20  Ht 5\' 7"  (1.702 m)  Wt 189 lb (85.73 kg)  BMI 29.59 kg/m2  SpO2 98%      Review of Systems  Constitutional: Negative for fever, chills, appetite change and fatigue.  HENT: Negative for congestion, dental problem, ear pain, hearing loss, sore throat, tinnitus, trouble swallowing and voice change.   Eyes: Negative for pain, discharge and visual disturbance.  Respiratory: Negative for cough,  chest tightness, wheezing and stridor.   Cardiovascular: Negative for chest pain, palpitations and leg swelling.  Gastrointestinal: Negative for nausea, vomiting, abdominal pain, diarrhea, constipation, blood in stool and abdominal distention.  Genitourinary: Negative for urgency, hematuria, flank pain, discharge, difficulty urinating and genital sores.  Musculoskeletal: Negative for myalgias, back pain, joint swelling, arthralgias, gait problem and neck stiffness.  Skin: Negative for rash.  Neurological: Negative for dizziness, syncope, speech difficulty, weakness, numbness and headaches.  Hematological: Negative for adenopathy. Does not bruise/bleed easily.  Psychiatric/Behavioral: Positive for dysphoric mood. Negative for behavioral problems. The patient is not nervous/anxious.        Objective:   Physical Exam  Constitutional: He is oriented to person, place, and time. He appears well-developed.  HENT:  Head: Normocephalic.  Right Ear: External ear normal.  Left Ear: External ear normal.  Eyes: Conjunctivae and EOM are normal.  Neck: Normal range of motion.  Cardiovascular: Normal rate and normal heart sounds.   Pulmonary/Chest: Breath sounds normal.  Abdominal: Bowel sounds are normal.  Musculoskeletal: Normal range of motion. He exhibits no edema or tenderness.  Neurological: He is alert and oriented to person, place, and time.  Psychiatric: He has a normal mood and affect. His behavior is normal.          Assessment & Plan:   Diabetes mellitus.  Will check a hemoglobin A1c Chronic kidney disease.  Last  GFR was 47. Hypertension, stable  We'll continue present regimen.  Will need to discontinue metformin therapy.  If GFR falls less than 30  Recheck 3 months

## 2015-04-02 ENCOUNTER — Other Ambulatory Visit: Payer: Self-pay | Admitting: Internal Medicine

## 2015-04-11 ENCOUNTER — Other Ambulatory Visit: Payer: Self-pay | Admitting: Internal Medicine

## 2015-04-28 ENCOUNTER — Other Ambulatory Visit: Payer: Self-pay | Admitting: Internal Medicine

## 2015-05-13 ENCOUNTER — Other Ambulatory Visit: Payer: Self-pay | Admitting: Internal Medicine

## 2015-06-01 ENCOUNTER — Other Ambulatory Visit: Payer: Self-pay | Admitting: Internal Medicine

## 2015-07-14 ENCOUNTER — Ambulatory Visit (INDEPENDENT_AMBULATORY_CARE_PROVIDER_SITE_OTHER): Payer: Medicare Other | Admitting: *Deleted

## 2015-07-14 DIAGNOSIS — Z23 Encounter for immunization: Secondary | ICD-10-CM | POA: Diagnosis not present

## 2015-07-14 LAB — HM DIABETES EYE EXAM

## 2015-07-15 ENCOUNTER — Encounter: Payer: Self-pay | Admitting: Internal Medicine

## 2015-08-12 ENCOUNTER — Encounter: Payer: Self-pay | Admitting: Internal Medicine

## 2015-08-12 ENCOUNTER — Ambulatory Visit (INDEPENDENT_AMBULATORY_CARE_PROVIDER_SITE_OTHER): Payer: Medicare Other | Admitting: Internal Medicine

## 2015-08-12 VITALS — BP 158/80 | HR 58 | Temp 98.3°F | Resp 20 | Ht 67.0 in | Wt 192.0 lb

## 2015-08-12 DIAGNOSIS — E785 Hyperlipidemia, unspecified: Secondary | ICD-10-CM

## 2015-08-12 DIAGNOSIS — I6523 Occlusion and stenosis of bilateral carotid arteries: Secondary | ICD-10-CM

## 2015-08-12 DIAGNOSIS — I10 Essential (primary) hypertension: Secondary | ICD-10-CM

## 2015-08-12 DIAGNOSIS — E0821 Diabetes mellitus due to underlying condition with diabetic nephropathy: Secondary | ICD-10-CM | POA: Diagnosis not present

## 2015-08-12 NOTE — Patient Instructions (Signed)
Limit your sodium (Salt) intake  Please check your blood pressure on a regular basis.  If it is consistently greater than 150/90, please make an office appointment.    It is important that you exercise regularly, at least 20 minutes 3 to 4 times per week.  If you develop chest pain or shortness of breath seek  medical attention.   Please check your hemoglobin A1c every 3-6  months

## 2015-08-12 NOTE — Progress Notes (Signed)
Subjective:    Patient ID: Austin [name], male    DOB: 1935-05-21, 80 y.o.   MRN: [id number]  HPI  Lab Results  Component Value Date   HGBA1C 6.5 03/10/2015     BP Readings from Last 3 Encounters:  08/12/15 158/80  03/10/15 140/70  12/24/14 144/70    Wt Readings from Last 3 Encounters:  08/12/15 192 lb (87.091 kg)  03/10/15 189 lb (85.73 kg)  12/24/14 187 lb 3.2 oz (84.26 kg)    80 year old patient who is seen today for follow-up.  He has a history of type 2 diabetes.  He states he was seen by cardiology recently and hemoglobin A1c was 6.8 about 6 weeks ago.  Losartan was uptitrated slightly at that time He is scheduled to see urology tomorrow and did have an eye examination about one month ago.  He feels well today.  Cardiac status is stable. There has been some modest weight gain  Past Medical History  Diagnosis Date  . 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 CAROTID ARTERY STENOSIS 05/10/2010  . DIABETES MELLITUS, TYPE II 09/19/2007  . DISEASE, CEREBROVASCULAR NEC 03/05/2007  . GERD 03/13/2007  . HYPERLIPIDEMIA 03/05/2007  . HYPERTENSION 03/05/2007  . HYPOKALEMIA 11/09/2009  . KNEE PAIN, RIGHT 11/09/2009  . PROSTATE CANCER, HX OF 03/05/2007  . RENAL DISEASE, CHRONIC 02/03/2009    Social History   Social History  . Marital Status: Married    Spouse Name: N/A  . Number of Children: N/A  . Years of Education: N/A   Occupational History  . Not on file.   Social History Main Topics  . Smoking status: Former Smoker    Types: Cigarettes    Quit date: 08/22/1974  . Smokeless tobacco: Never Used  . Alcohol Use: No  . Drug Use: No  . Sexual Activity: Not on file   Other Topics Concern  . Not on file   Social History Narrative    Past Surgical History  Procedure Laterality Date  . Carotid artery angioplasty Right Oct. 10, 2001  . Laparoscopic appendectomy  02/20/2012    Procedure: APPENDECTOMY LAPAROSCOPIC;  Surgeon: Stark Klein, MD;  Location: [place];   Service: General;  Laterality: N/A;  . Appendectomy  02-20-12  . Prostate surgery      prostatectomy    Family History  Problem Relation Age of Onset  . Hypertension Mother   . Cancer Father   . Cancer Brother     Allergies  Allergen Reactions  . Aspirin Other (See Comments)    High doses causes stomach ulcer and bleeding    Current Outpatient Prescriptions on File Prior to Visit  Medication Sig Dispense Refill  . amlodipine-atorvastatin (CADUET) 10-20 MG per tablet TAKE 1 TABLET BY MOUTH EVERY DAY 90 tablet 1  . aspirin 81 MG tablet Take 81 mg by mouth daily.      . clopidogrel (PLAVIX) 75 MG tablet TAKE 1 TABLET (75 MG TOTAL) BY MOUTH DAILY. 100 tablet 0  . esomeprazole (NEXIUM) 40 MG capsule TAKE 1 CAPSULE BY MOUTH ONCE DAILY 90 capsule 3  . glipiZIDE (GLUCOTROL XL) 5 MG 24 hr tablet TAKE 1 TABLET EVEYR DAY 90 tablet 3  . glucose blood (ONETOUCH VERIO) test strip USE TO CHECK BLOOD SUGAR DAILY AND PRN 100 each 4  . HYDROcodone-acetaminophen (NORCO/VICODIN) 5-325 MG per tablet Take 1 tablet by mouth every 6 (six) hours as needed. 10 tablet 0  . hydrocortisone-pramoxine (ANALPRAM-HC) 2.5-1 % rectal cream USE  ONCE DAILY OR AS DIRECTED 30 g 2  . metFORMIN (GLUCOPHAGE-XR) 500 MG 24 hr tablet TAKE 1 TABLET EVERY DAY WITH BREAKFAST 90 tablet 2  . metoprolol succinate (TOPROL-XL) 25 MG 24 hr tablet Take 25 mg by mouth daily.     . ONE TOUCH LANCETS MISC USE TO CHECK BLOOD SUGAR DAILY AND PRN 200 each 2  . potassium chloride SA (KLOR-CON M20) 20 MEQ tablet TAKE 1 TABLET BY MOUTH EVERY DAY 100 tablet 3   No current facility-administered medications on file prior to visit.    BP 158/80 mmHg  Pulse 58  Temp(Src) 98.3 F (36.8 C) (Oral)  Resp 20  Ht 5\' 7"  (1.702 m)  Wt 192 lb (87.091 kg)  BMI 30.06 kg/m2  SpO2 98%     Review of Systems  Constitutional: Negative for fever, chills, appetite change and fatigue.  HENT: Negative for congestion, dental problem, ear pain, hearing  loss, sore throat, tinnitus, trouble swallowing and voice change.   Eyes: Negative for pain, discharge and visual disturbance.  Respiratory: Negative for cough, chest tightness, wheezing and stridor.   Cardiovascular: Negative for chest pain, palpitations and leg swelling.  Gastrointestinal: Negative for nausea, vomiting, abdominal pain, diarrhea, constipation, blood in stool and abdominal distention.  Genitourinary: Negative for urgency, hematuria, flank pain, discharge, difficulty urinating and genital sores.  Musculoskeletal: Negative for myalgias, back pain, joint swelling, arthralgias, gait problem and neck stiffness.  Skin: Negative for rash.  Neurological: Negative for dizziness, syncope, speech difficulty, weakness, numbness and headaches.  Hematological: Negative for adenopathy. Does not bruise/bleed easily.  Psychiatric/Behavioral: Negative for behavioral problems and dysphoric mood. The patient is not nervous/anxious.        Objective:   Physical Exam  Constitutional: He is oriented to person, place, and time. He appears well-developed.  Repeat blood pressure 140/72  HENT:  Head: Normocephalic.  Right Ear: External ear normal.  Left Ear: External ear normal.  Eyes: Conjunctivae and EOM are normal.  Neck: Normal range of motion.  Cardiovascular: Normal rate and normal heart sounds.   Pulmonary/Chest: Breath sounds normal.  Abdominal: Bowel sounds are normal.  Musculoskeletal: Normal range of motion. He exhibits no edema or tenderness.  Neurological: He is alert and oriented to person, place, and time.  Psychiatric: He has a normal mood and affect. His behavior is normal.          Assessment & Plan:   Hypertension.  We'll continue present regimen.  Low-salt diet, exercise, weight loss.  All encouraged  Diabetes mellitus type 2.  No change in therapy.  Hemoglobin A1c at goal  Dyslipidemia.  Continue statin therapy  Carotid artery stenosis.  Scheduled for follow-up  carotid artery Doppler ultrasound in 3 months  History prostate cancer.  Follow-up urology as scheduled   Return in 6 months for follow-up

## 2015-08-12 NOTE — Progress Notes (Signed)
Pre visit review using our clinic review tool, if applicable. No additional management support is needed unless otherwise documented below in the visit note. 

## 2015-08-30 ENCOUNTER — Other Ambulatory Visit: Payer: Self-pay | Admitting: Internal Medicine

## 2015-09-03 ENCOUNTER — Other Ambulatory Visit: Payer: Self-pay | Admitting: Internal Medicine

## 2015-10-02 ENCOUNTER — Other Ambulatory Visit: Payer: Self-pay | Admitting: Cardiology

## 2015-10-02 DIAGNOSIS — R079 Chest pain, unspecified: Secondary | ICD-10-CM

## 2015-10-05 ENCOUNTER — Encounter (HOSPITAL_COMMUNITY)
Admission: RE | Admit: 2015-10-05 | Discharge: 2015-10-05 | Disposition: A | Discharge status: Home / Self Care | Payer: Medicare Other | Source: Ambulatory Visit | Attending: Cardiology | Admitting: Cardiology

## 2015-10-05 ENCOUNTER — Other Ambulatory Visit: Payer: Self-pay | Admitting: Internal Medicine

## 2015-10-05 DIAGNOSIS — R079 Chest pain, unspecified: Secondary | ICD-10-CM

## 2015-10-05 MED ORDER — REGADENOSON 0.4 MG/5ML IV SOLN
INTRAVENOUS | Status: AC
  Filled 2015-10-05: qty 5

## 2015-10-05 MED ORDER — TECHNETIUM TC 99M SESTAMIBI GENERIC - CARDIOLITE
30.0000 | Freq: Once | INTRAVENOUS | Status: AC | PRN
  Administered 2015-10-05: 30 via INTRAVENOUS

## 2015-10-05 MED ORDER — REGADENOSON 0.4 MG/5ML IV SOLN
0.4000 mg | Freq: Once | INTRAVENOUS | Status: AC
  Administered 2015-10-05: 0.4 mg via INTRAVENOUS

## 2015-10-05 MED ORDER — TECHNETIUM TC 99M SESTAMIBI GENERIC - CARDIOLITE
10.0000 | Freq: Once | INTRAVENOUS | Status: AC | PRN
  Administered 2015-10-05: 10 via INTRAVENOUS

## 2015-10-06 LAB — NM MYOCAR MULTI W/SPECT W/WALL MOTION / EF
CHL CUP STRESS STAGE 1 DBP: 80 mmHg
CHL CUP STRESS STAGE 1 GRADE: 0 %
CHL CUP STRESS STAGE 2 GRADE: 0 %
CHL CUP STRESS STAGE 2 SPEED: 0 mph
CHL CUP STRESS STAGE 3 DBP: 70 mmHg
CHL CUP STRESS STAGE 3 GRADE: 0 %
CHL CUP STRESS STAGE 3 HR: 85 {beats}/min
CHL CUP STRESS STAGE 3 SBP: 162 mmHg
CHL CUP STRESS STAGE 4 DBP: 66 mmHg
CHL CUP STRESS STAGE 4 HR: 80 {beats}/min
CHL CUP STRESS STAGE 4 SBP: 153 mmHg
CSEPPBP: 153 mmHg
CSEPPHR: 80 {beats}/min
Estimated workload: 1 METS
Percent of predicted max HR: 56 %
Stage 1 HR: 68 {beats}/min
Stage 1 SBP: 170 mmHg
Stage 1 Speed: 0 mph
Stage 2 HR: 68 {beats}/min
Stage 3 Speed: 0 mph
Stage 4 Grade: 0 %
Stage 4 Speed: 0 mph

## 2015-10-09 ENCOUNTER — Other Ambulatory Visit: Payer: Self-pay | Admitting: Internal Medicine

## 2015-10-26 ENCOUNTER — Other Ambulatory Visit: Payer: Self-pay | Admitting: Internal Medicine

## 2015-11-04 ENCOUNTER — Encounter: Payer: Self-pay | Admitting: Family

## 2015-11-09 ENCOUNTER — Other Ambulatory Visit: Payer: Self-pay | Admitting: Internal Medicine

## 2015-11-11 ENCOUNTER — Encounter: Payer: Self-pay | Admitting: Internal Medicine

## 2015-11-11 ENCOUNTER — Ambulatory Visit (INDEPENDENT_AMBULATORY_CARE_PROVIDER_SITE_OTHER): Payer: Medicare Other | Admitting: Internal Medicine

## 2015-11-11 VITALS — BP 138/70 | HR 65 | Temp 98.2°F | Resp 20 | Ht 67.0 in | Wt 186.0 lb

## 2015-11-11 DIAGNOSIS — E0821 Diabetes mellitus due to underlying condition with diabetic nephropathy: Secondary | ICD-10-CM

## 2015-11-11 DIAGNOSIS — E785 Hyperlipidemia, unspecified: Secondary | ICD-10-CM | POA: Diagnosis not present

## 2015-11-11 DIAGNOSIS — I10 Essential (primary) hypertension: Secondary | ICD-10-CM

## 2015-11-11 DIAGNOSIS — I6529 Occlusion and stenosis of unspecified carotid artery: Secondary | ICD-10-CM

## 2015-11-11 LAB — CBC WITH DIFFERENTIAL/PLATELET
BASOS ABS: 0 10*3/uL (ref 0.0–0.1)
Basophils Relative: 0.3 % (ref 0.0–3.0)
EOS ABS: 0.1 10*3/uL (ref 0.0–0.7)
Eosinophils Relative: 1.4 % (ref 0.0–5.0)
HEMATOCRIT: 37.7 % — AB (ref 39.0–52.0)
HEMOGLOBIN: 12.5 g/dL — AB (ref 13.0–17.0)
LYMPHS PCT: 16.2 % (ref 12.0–46.0)
Lymphs Abs: 1.1 10*3/uL (ref 0.7–4.0)
MCHC: 33.2 g/dL (ref 30.0–36.0)
MCV: 81.7 fl (ref 78.0–100.0)
Monocytes Absolute: 0.4 10*3/uL (ref 0.1–1.0)
Monocytes Relative: 6.2 % (ref 3.0–12.0)
Neutro Abs: 5.1 10*3/uL (ref 1.4–7.7)
Neutrophils Relative %: 75.9 % (ref 43.0–77.0)
Platelets: 262 10*3/uL (ref 150.0–400.0)
RBC: 4.61 Mil/uL (ref 4.22–5.81)
RDW: 14 % (ref 11.5–15.5)
WBC: 6.7 10*3/uL (ref 4.0–10.5)

## 2015-11-11 LAB — BASIC METABOLIC PANEL
BUN: 29 mg/dL — AB (ref 6–23)
CHLORIDE: 100 meq/L (ref 96–112)
CO2: 33 mEq/L — ABNORMAL HIGH (ref 19–32)
Calcium: 9.6 mg/dL (ref 8.4–10.5)
Creatinine, Ser: 1.92 mg/dL — ABNORMAL HIGH (ref 0.40–1.50)
GFR: 43.65 mL/min — ABNORMAL LOW (ref >60.00)
GLUCOSE: 104 mg/dL — AB (ref 70–99)
POTASSIUM: 3.2 meq/L — AB (ref 3.5–5.1)
SODIUM: 141 meq/L (ref 135–145)

## 2015-11-11 LAB — HEMOGLOBIN A1C: HEMOGLOBIN A1C: 7 % — AB (ref 4.6–6.5)

## 2015-11-11 NOTE — Patient Instructions (Signed)
Acute bronchitis symptoms  are generally not helped by antibiotics.  Take over-the-counter expectorants and cough medications such as  Mucinex DM.  Call if there is no improvement in 5 to 7 days or if  you develop worsening cough, fever, or new symptoms, such as shortness of breath or chest pain.  Limit your sodium (Salt) intake   Please check your hemoglobin A1c every 3-6  months

## 2015-11-11 NOTE — Progress Notes (Signed)
Subjective:    Patient ID: Austin [name], male    DOB: April 07, 1935, 81 y.o.   MRN: [id number]  HPI 81 year old patient who presents with a seven-day history of nonproductive cough.  He has had some chest congestion and fatigue.  No fever, chills, wheezing or shortness of breath.  He has taken no medications He has a history of diabetes controlled with oral medications, CAD by chronic kidney disease.  He has maintained a very nice glycemic control.  Last hemoglobin A1c 6.5 He has essential hypertension which has been stable. He has carotid artery disease.  He is scheduled for follow-up carotid artery Doppler studies tomorrow He has dyslipidemia and remains on atorvastatin.  Past Medical History  Diagnosis Date  . 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 CAROTID ARTERY STENOSIS 05/10/2010  . DIABETES MELLITUS, TYPE II 09/19/2007  . DISEASE, CEREBROVASCULAR NEC 03/05/2007  . GERD 03/13/2007  . HYPERLIPIDEMIA 03/05/2007  . HYPERTENSION 03/05/2007  . HYPOKALEMIA 11/09/2009  . KNEE PAIN, RIGHT 11/09/2009  . PROSTATE CANCER, HX OF 03/05/2007  . RENAL DISEASE, CHRONIC 02/03/2009    Social History   Social History  . Marital Status: Married    Spouse Name: N/A  . Number of Children: N/A  . Years of Education: N/A   Occupational History  . Not on file.   Social History Main Topics  . Smoking status: Former Smoker    Types: Cigarettes    Quit date: 08/22/1974  . Smokeless tobacco: Never Used  . Alcohol Use: No  . Drug Use: No  . Sexual Activity: Not on file   Other Topics Concern  . Not on file   Social History Narrative    Past Surgical History  Procedure Laterality Date  . Carotid artery angioplasty Right Oct. 10, 2001  . Laparoscopic appendectomy  02/20/2012    Procedure: APPENDECTOMY LAPAROSCOPIC;  Surgeon: Stark Klein, MD;  Location: [place];  Service: General;  Laterality: N/A;  . Appendectomy  02-20-12  . Prostate surgery      prostatectomy    Family History  Problem  Relation Age of Onset  . Hypertension Mother   . Cancer Father   . Cancer Brother     Allergies  Allergen Reactions  . Aspirin Other (See Comments)    High doses causes stomach ulcer and bleeding    Current Outpatient Prescriptions on File Prior to Visit  Medication Sig Dispense Refill  . amlodipine-atorvastatin (CADUET) 10-20 MG tablet TAKE 1 TABLET BY MOUTH EVERY DAY 90 tablet 1  . aspirin 81 MG tablet Take 81 mg by mouth daily.      . clopidogrel (PLAVIX) 75 MG tablet TAKE 1 TABLET (75 MG TOTAL) BY MOUTH DAILY. 90 tablet 2  . esomeprazole (NEXIUM) 40 MG capsule TAKE 1 CAPSULE BY MOUTH ONCE DAILY 90 capsule 3  . glipiZIDE (GLUCOTROL XL) 5 MG 24 hr tablet TAKE 1 TABLET EVEYR DAY 90 tablet 3  . HYDROcodone-acetaminophen (NORCO/VICODIN) 5-325 MG per tablet Take 1 tablet by mouth every 6 (six) hours as needed. 10 tablet 0  . hydrocortisone-pramoxine (ANALPRAM-HC) 2.5-1 % rectal cream USE ONCE DAILY OR AS DIRECTED 30 g 2  . losartan-hydrochlorothiazide (HYZAAR) 100-25 MG tablet     . metFORMIN (GLUCOPHAGE-XR) 500 MG 24 hr tablet TAKE 1 TABLET EVERY DAY WITH BREAKFAST 90 tablet 2  . metoprolol succinate (TOPROL-XL) 25 MG 24 hr tablet Take 25 mg by mouth daily.     Glory Rosebush DELICA LANCETS [id number] MISC USE TO  CHECK BLOOD SUGAR DAILY AND AS NEEDED 200 each 1  . ONETOUCH VERIO test strip USE TO CHECK BLOOD SUGAR DAILY AND AS NEEDED 100 each 3  . potassium chloride SA (KLOR-CON M20) 20 MEQ tablet TAKE 1 TABLET BY MOUTH EVERY DAY 100 tablet 3   No current facility-administered medications on file prior to visit.    BP 138/70 mmHg  Pulse 65  Temp(Src) 98.2 F (36.8 C) (Oral)  Resp 20  Ht 5\' 7"  (1.702 m)  Wt 186 lb (84.369 kg)  BMI 29.12 kg/m2  SpO2 97%      Review of Systems  Constitutional: Positive for fatigue. Negative for fever, chills and appetite change.  HENT: Negative for congestion, dental problem, ear pain, hearing loss, sore throat, tinnitus, trouble swallowing and voice  change.   Eyes: Negative for pain, discharge and visual disturbance.  Respiratory: Positive for cough. Negative for chest tightness, wheezing and stridor.   Cardiovascular: Negative for chest pain, palpitations and leg swelling.  Gastrointestinal: Negative for nausea, vomiting, abdominal pain, diarrhea, constipation, blood in stool and abdominal distention.  Genitourinary: Negative for urgency, hematuria, flank pain, discharge, difficulty urinating and genital sores.  Musculoskeletal: Negative for myalgias, back pain, joint swelling, arthralgias, gait problem and neck stiffness.  Skin: Negative for rash.  Neurological: Negative for dizziness, syncope, speech difficulty, weakness, numbness and headaches.  Hematological: Negative for adenopathy. Does not bruise/bleed easily.  Psychiatric/Behavioral: Negative for behavioral problems and dysphoric mood. The patient is not nervous/anxious.        Objective:   Physical Exam  Constitutional: He is oriented to person, place, and time. He appears well-developed.  Blood pressure 130/70  HENT:  Head: Normocephalic.  Right Ear: External ear normal.  Left Ear: External ear normal.  Eyes: Conjunctivae and EOM are normal.  Neck: Normal range of motion.  Cardiovascular: Normal rate and normal heart sounds.   Pulmonary/Chest: Breath sounds normal. No respiratory distress. He has no wheezes. He has no rales.  A few scattered rhonchi heard anteriorly  Abdominal: Bowel sounds are normal.  Musculoskeletal: Normal range of motion. He exhibits no edema or tenderness.  Neurological: He is alert and oriented to person, place, and time.  Psychiatric: He has a normal mood and affect. His behavior is normal.          Assessment & Plan:      Viral URI with cough.  Will treat symptomatically Hypertension, well-controlled Diabetes mellitus.  Will check a hemoglobin A1c Dyslipidemia.  Continue atorvastatin Chronic kidney disease.  We'll recheck renal  indices  Schedule CPX 4 months

## 2015-11-11 NOTE — Progress Notes (Signed)
Pre visit review using our clinic review tool, if applicable. No additional management support is needed unless otherwise documented below in the visit note. 

## 2015-11-12 ENCOUNTER — Ambulatory Visit (HOSPITAL_COMMUNITY)
Admission: RE | Admit: 2015-11-12 | Discharge: 2015-11-12 | Disposition: A | Discharge status: Home / Self Care | Payer: Medicare Other | Source: Ambulatory Visit | Attending: Family | Admitting: Family

## 2015-11-12 ENCOUNTER — Other Ambulatory Visit: Payer: Self-pay | Admitting: *Deleted

## 2015-11-12 ENCOUNTER — Encounter: Payer: Self-pay | Admitting: Family

## 2015-11-12 ENCOUNTER — Ambulatory Visit (INDEPENDENT_AMBULATORY_CARE_PROVIDER_SITE_OTHER): Payer: Medicare Other | Admitting: Family

## 2015-11-12 VITALS — BP 174/83 | HR 65 | Ht 67.0 in | Wt 185.8 lb

## 2015-11-12 DIAGNOSIS — I779 Disorder of arteries and arterioles, unspecified: Secondary | ICD-10-CM | POA: Diagnosis not present

## 2015-11-12 DIAGNOSIS — E785 Hyperlipidemia, unspecified: Secondary | ICD-10-CM | POA: Diagnosis not present

## 2015-11-12 DIAGNOSIS — I10 Essential (primary) hypertension: Secondary | ICD-10-CM | POA: Diagnosis not present

## 2015-11-12 DIAGNOSIS — K219 Gastro-esophageal reflux disease without esophagitis: Secondary | ICD-10-CM | POA: Insufficient documentation

## 2015-11-12 DIAGNOSIS — E119 Type 2 diabetes mellitus without complications: Secondary | ICD-10-CM | POA: Diagnosis not present

## 2015-11-12 DIAGNOSIS — I6523 Occlusion and stenosis of bilateral carotid arteries: Secondary | ICD-10-CM

## 2015-11-12 DIAGNOSIS — Z87891 Personal history of nicotine dependence: Secondary | ICD-10-CM | POA: Diagnosis not present

## 2015-11-12 DIAGNOSIS — Z9862 Peripheral vascular angioplasty status: Secondary | ICD-10-CM | POA: Insufficient documentation

## 2015-11-12 NOTE — Progress Notes (Signed)
Chief Complaint: Extracranial Carotid Artery Stenosis   History of Present Illness  Austin [name] is a 81 y.o. male patient of Dr. Oneida Alar who is status post a right carotid intracranial angioplasty in October 2001 by Dr. Estanislado Pandy. He returns today for routine surveillance.  He had a right occular event that he states was not a stroke, may have been a TIA but denies monocular loss of vision.  The patient denies facial drooping. Pt. denies hemiplegia. The patient denies receptive or expressive aphasia. Pt. denies extremity weakness. He denies claudication symptoms in legs with walking.  Yesterday pt's blood pressure in his PCP's office was 138/70, is elevated at this time. Pt states his blood pressure is highest in Dr. Zenia Resides office.   Patient reports New Medical or Surgical History: resolving mild URI, saw his PCP yesterday.   Pt Diabetic: Yes, 6.8 A1C recently, in control Pt smoker: former smoker, quit in 1976  Pt meds include: Statin : Yes ASA: no, pt states Dr. Terrence Dupont is aware Other anticoagulants/antiplatelets: Plavix   Past Medical History  Diagnosis Date  . 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 CAROTID ARTERY STENOSIS 05/10/2010  . DIABETES MELLITUS, TYPE II 09/19/2007  . DISEASE, CEREBROVASCULAR NEC 03/05/2007  . GERD 03/13/2007  . HYPERLIPIDEMIA 03/05/2007  . HYPERTENSION 03/05/2007  . HYPOKALEMIA 11/09/2009  . KNEE PAIN, RIGHT 11/09/2009  . PROSTATE CANCER, HX OF 03/05/2007  . RENAL DISEASE, CHRONIC 02/03/2009    Social History Social History  Substance Use Topics  . Smoking status: Former Smoker    Types: Cigarettes    Quit date: 08/22/1974  . Smokeless tobacco: Never Used  . Alcohol Use: No    Family History Family History  Problem Relation Age of Onset  . Hypertension Mother   . Cancer Father   . Cancer Brother     Surgical History Past Surgical History  Procedure Laterality Date  . Carotid artery angioplasty Right Oct. 10, 2001  .  Laparoscopic appendectomy  02/20/2012    Procedure: APPENDECTOMY LAPAROSCOPIC;  Surgeon: Stark Klein, MD;  Location: [place];  Service: General;  Laterality: N/A;  . Appendectomy  02-20-12  . Prostate surgery      prostatectomy    Allergies  Allergen Reactions  . Aspirin Other (See Comments)    High doses causes stomach ulcer and bleeding    Current Outpatient Prescriptions  Medication Sig Dispense Refill  . amlodipine-atorvastatin (CADUET) 10-20 MG tablet TAKE 1 TABLET BY MOUTH EVERY DAY 90 tablet 1  . aspirin 81 MG tablet Take 81 mg by mouth daily.      . clopidogrel (PLAVIX) 75 MG tablet TAKE 1 TABLET (75 MG TOTAL) BY MOUTH DAILY. 90 tablet 2  . esomeprazole (NEXIUM) 40 MG capsule TAKE 1 CAPSULE BY MOUTH ONCE DAILY 90 capsule 3  . glipiZIDE (GLUCOTROL XL) 5 MG 24 hr tablet TAKE 1 TABLET EVEYR DAY 90 tablet 3  . hydrocortisone-pramoxine (ANALPRAM-HC) 2.5-1 % rectal cream USE ONCE DAILY OR AS DIRECTED 30 g 2  . losartan-hydrochlorothiazide (HYZAAR) 100-25 MG tablet     . metFORMIN (GLUCOPHAGE-XR) 500 MG 24 hr tablet TAKE 1 TABLET EVERY DAY WITH BREAKFAST 90 tablet 2  . metoprolol succinate (TOPROL-XL) 25 MG 24 hr tablet Take 25 mg by mouth daily.     Glory Rosebush DELICA LANCETS [id number] MISC USE TO CHECK BLOOD SUGAR DAILY AND AS NEEDED 200 each 1  . ONETOUCH VERIO test strip USE TO CHECK BLOOD SUGAR DAILY AND AS NEEDED 100  each 3  . potassium chloride SA (KLOR-CON M20) 20 MEQ tablet TAKE 1 TABLET BY MOUTH EVERY DAY 100 tablet 3  . HYDROcodone-acetaminophen (NORCO/VICODIN) 5-325 MG per tablet Take 1 tablet by mouth every 6 (six) hours as needed. (Patient not taking: Reported on 11/12/2015) 10 tablet 0   No current facility-administered medications for this visit.    Review of Systems : See HPI for pertinent positives and negatives.  Physical Examination  Filed Vitals:   11/12/15 1037 11/12/15 1039  BP: 173/80 174/83  Pulse: 65   Height: 5\' 7"  (1.702 m)   Weight: 185 lb 12.8 oz (84.278 kg)    SpO2: 97%    Body mass index is 29.09 kg/(m^2).   General: WDWN male in NAD, lipoma behind left ear GAIT: normal Eyes: PERRLA Pulmonary: Non-labored, CTAB, no rales, + rhonchi in posterior right lobes, & Negative wheezing. Occasional moist cough.  Cardiac: regular Rhythm, no detected murmur.  VASCULAR EXAM Carotid Bruits Left Right   Negative Negative    Radial pulses are 3+ palpable and equal.      LE Pulses LEFT RIGHT   POPLITEAL not palpable  not palpable   POSTERIOR TIBIAL  palpable   palpable    DORSALIS PEDIS  ANTERIOR TIBIAL palpable  palpable     Gastrointestinal: soft, nontender, BS WNL, no r/g, no palpable masses.  Musculoskeletal: No muscle atrophy/wasting. M/S 5/5 throughout, Extremities without ischemic changes.2  Neurologic: A&O X 3; Appropriate Affect,  Speech is normal CN 2-12 intact, Pain and light touch intact in extremities, Motor exam as listed above.                Non-Invasive Vascular Imaging CAROTID DUPLEX 11/12/2015   Right ICA: minimal stenosis. Left ICA: minimal stenosis. No significant change compared to prior exam.   Assessment: Austin [name] is a 81 y.o. male who is status post a right intracranial carotid angioplasty in October 2001. He has no history of stroke, did have possible TIA in 2001 prior to the intracranial carotid angioplasty.  Today's carotid duplex suggests minimal bilateral ICA stenosis, no significant change compared to prior exam.  Pt's extracranial carotid artery stenosis has remained minimal. Will extend surveillance to 2 years of his extracranial carotid arteries.   Plan: Follow-up in 2 years with Carotid Duplex scan.   I discussed in depth with the patient the nature of atherosclerosis, and emphasized the  importance of maximal medical management including strict control of blood pressure, blood glucose, and lipid levels, obtaining regular exercise, and continued cessation of smoking.  The patient is aware that without maximal medical management the underlying atherosclerotic disease process will progress, limiting the benefit of any interventions. The patient was given information about stroke prevention and what symptoms should prompt the patient to seek immediate medical care. Thank you for allowing [place] to participate in this patient's care.  Clemon Chambers, RN, MSN, FNP-C [facility] Office: [phone]  Clinic Physician: Early  11/12/2015 10:39 AM

## 2015-11-12 NOTE — Patient Instructions (Signed)
Stroke Prevention °Some health problems and behaviors may make it more likely for you to have a stroke. Below are ways to lessen your risk of having a stroke.  °· Be active for at least 30 minutes on most or all days. °· Do not smoke. Try not to be around others who smoke. °· Do not drink too much alcohol. °¨ Do not have more than 2 drinks a day if you are a man. °¨ Do not have more than 1 drink a day if you are a woman and are not pregnant. °· Eat healthy foods, such as fruits and vegetables. If you were put on a specific diet, follow the diet as told. °· Keep your cholesterol levels under control through diet and medicines. Look for foods that are low in saturated fat, trans fat, cholesterol, and are high in fiber. °· If you have diabetes, follow all diet plans and take your medicine as told. °· Ask your doctor if you need treatment to lower your blood pressure. If you have high blood pressure (hypertension), follow all diet plans and take your medicine as told by your doctor. °· If you are 18-39 years old, have your blood pressure checked every 3-5 years. If you are age 40 or older, have your blood pressure checked every year. °· Keep a healthy weight. Eat foods that are low in calories, salt, saturated fat, trans fat, and cholesterol. °· Do not take drugs. °· Avoid birth control pills, if this applies. Talk to your doctor about the risks of taking birth control pills. °· Talk to your doctor if you have sleep problems (sleep apnea). °· Take all medicine as told by your doctor. °¨ You may be told to take aspirin or blood thinner medicine. Take this medicine as told by your doctor. °¨ Understand your medicine instructions. °· Make sure any other conditions you have are being taken care of. °GET HELP RIGHT AWAY IF: °· You suddenly lose feeling (you feel numb) or have weakness in your face, arm, or leg. °· Your face or eyelid hangs down to one side. °· You suddenly feel confused. °· You have trouble talking (aphasia)  or understanding what people are saying. °· You suddenly have trouble seeing in one or both eyes. °· You suddenly have trouble walking. °· You are dizzy. °· You lose your balance or your movements are clumsy (uncoordinated). °· You suddenly have a very bad headache and you do not know the cause. °· You have new chest pain. °· Your heart feels like it is fluttering or skipping a beat (irregular heartbeat). °Do not wait to see if the symptoms above go away. Get help right away. Call your local emergency services (911 in [place] Do not drive yourself to the hospital. °  °This information is not intended to replace advice given to you by your health care provider. Make sure you discuss any questions you have with your health care provider. °  °Document Released: 02/07/2012 Document Revised: 08/29/2014 Document Reviewed: 02/08/2013 °Elsevier Interactive Patient Education ©2016 [employer] ° °

## 2015-12-08 LAB — HM DIABETES EYE EXAM

## 2015-12-10 ENCOUNTER — Encounter: Payer: Self-pay | Admitting: Internal Medicine

## 2015-12-10 ENCOUNTER — Telehealth: Payer: Self-pay | Admitting: *Deleted

## 2015-12-10 NOTE — Telephone Encounter (Signed)
Clopidogrel 75 mg being filled.  Concurrent use of esomeprazole, omeprazole, or cimetidine may result in decreasing clopidogrel effectiveness, resulting in  Increased risk of adverse cardiac events.  FYI

## 2015-12-18 ENCOUNTER — Other Ambulatory Visit: Payer: Self-pay | Admitting: Internal Medicine

## 2016-01-12 ENCOUNTER — Telehealth: Payer: Self-pay

## 2016-01-12 ENCOUNTER — Other Ambulatory Visit: Payer: Self-pay

## 2016-01-12 MED ORDER — GLUCOSE BLOOD VI STRP: ORAL_STRIP | Status: DC

## 2016-01-12 NOTE — Telephone Encounter (Signed)
Patient is on my [employer] list for 2017. Pt may be a good candidate for an AWV for 2017 

## 2016-01-31 ENCOUNTER — Other Ambulatory Visit: Payer: Self-pay | Admitting: Internal Medicine

## 2016-02-01 NOTE — Telephone Encounter (Signed)
Refill sent to pharmacy.   

## 2016-02-10 ENCOUNTER — Encounter: Payer: Self-pay | Admitting: Internal Medicine

## 2016-02-10 ENCOUNTER — Ambulatory Visit (INDEPENDENT_AMBULATORY_CARE_PROVIDER_SITE_OTHER): Payer: Medicare Other | Admitting: Internal Medicine

## 2016-02-10 VITALS — BP 140/70 | HR 60 | Temp 98.3°F | Resp 20 | Ht 67.0 in | Wt 188.0 lb

## 2016-02-10 DIAGNOSIS — D500 Iron deficiency anemia secondary to blood loss (chronic): Secondary | ICD-10-CM | POA: Diagnosis not present

## 2016-02-10 DIAGNOSIS — E0821 Diabetes mellitus due to underlying condition with diabetic nephropathy: Secondary | ICD-10-CM

## 2016-02-10 DIAGNOSIS — I10 Essential (primary) hypertension: Secondary | ICD-10-CM

## 2016-02-10 DIAGNOSIS — Z8546 Personal history of malignant neoplasm of prostate: Secondary | ICD-10-CM

## 2016-02-10 LAB — CBC WITH DIFFERENTIAL/PLATELET
Basophils Absolute: 0.1 10*3/uL (ref 0.0–0.1)
Basophils Relative: 0.7 % (ref 0.0–3.0)
Eosinophils Absolute: 0.1 10*3/uL (ref 0.0–0.7)
Eosinophils Relative: 1.6 % (ref 0.0–5.0)
HCT: 38.8 % — ABNORMAL LOW (ref 39.0–52.0)
HEMOGLOBIN: 12.7 g/dL — AB (ref 13.0–17.0)
LYMPHS ABS: 1.4 10*3/uL (ref 0.7–4.0)
Lymphocytes Relative: 17.3 % (ref 12.0–46.0)
MCHC: 32.7 g/dL (ref 30.0–36.0)
MCV: 82.4 fl (ref 78.0–100.0)
MONO ABS: 0.5 10*3/uL (ref 0.1–1.0)
Monocytes Relative: 6.1 % (ref 3.0–12.0)
NEUTROS PCT: 74.3 % (ref 43.0–77.0)
Neutro Abs: 5.9 10*3/uL (ref 1.4–7.7)
PLATELETS: 379 10*3/uL (ref 150.0–400.0)
RBC: 4.7 Mil/uL (ref 4.22–5.81)
RDW: 14.2 % (ref 11.5–15.5)
WBC: 7.9 10*3/uL (ref 4.0–10.5)

## 2016-02-10 LAB — LIPID PANEL
CHOLESTEROL: 170 mg/dL (ref 0–200)
HDL: 40.4 mg/dL (ref >39.00)
LDL Cholesterol: 107 mg/dL — ABNORMAL HIGH (ref 0–99)
NONHDL: 129.37
Total CHOL/HDL Ratio: 4
Triglycerides: 111 mg/dL (ref 0.0–149.0)
VLDL: 22.2 mg/dL (ref 0.0–40.0)

## 2016-02-10 LAB — COMPREHENSIVE METABOLIC PANEL
ALBUMIN: 4.3 g/dL (ref 3.5–5.2)
ALK PHOS: 63 U/L (ref 39–117)
ALT: 6 U/L (ref 0–53)
AST: 10 U/L (ref 0–37)
BILIRUBIN TOTAL: 0.6 mg/dL (ref 0.2–1.2)
BUN: 31 mg/dL — AB (ref 6–23)
CO2: 34 mEq/L — ABNORMAL HIGH (ref 19–32)
CREATININE: 1.8 mg/dL — AB (ref 0.40–1.50)
Calcium: 10.8 mg/dL — ABNORMAL HIGH (ref 8.4–10.5)
Chloride: 101 mEq/L (ref 96–112)
GFR: 46.99 mL/min — ABNORMAL LOW (ref >60.00)
GLUCOSE: 111 mg/dL — AB (ref 70–99)
Potassium: 3.6 mEq/L (ref 3.5–5.1)
SODIUM: 142 meq/L (ref 135–145)
TOTAL PROTEIN: 7.4 g/dL (ref 6.0–8.3)

## 2016-02-10 LAB — PSA: PSA: 0.21 ng/mL (ref 0.10–4.00)

## 2016-02-10 LAB — MICROALBUMIN / CREATININE URINE RATIO
Creatinine,U: 165.2 mg/dL
MICROALB UR: 69.1 mg/dL — AB (ref 0.0–1.9)
Microalb Creat Ratio: 41.8 mg/g — ABNORMAL HIGH (ref 0.0–30.0)

## 2016-02-10 LAB — TSH: TSH: 0.79 u[IU]/mL (ref 0.35–4.50)

## 2016-02-10 NOTE — Patient Instructions (Signed)
Please check your hemoglobin A1c every 3-6  Months  Please check your blood pressure on a regular basis.  If it is consistently greater than 150/90, please make an office appointment.  Limit your sodium (Salt) intake

## 2016-02-10 NOTE — Progress Notes (Signed)
Pre visit review using our clinic review tool, if applicable. No additional management support is needed unless otherwise documented below in the visit note. 

## 2016-02-10 NOTE — Progress Notes (Signed)
Subjective:    Patient ID: Austin [name], male    DOB: 02-17-1935, 81 y.o.   MRN: [id number]  HPI  81 year old patient who is seen today for follow-up of type 2 diabetes.  Complications include renal insufficiency.  He has carotid artery stenosis.  He has dyslipidemia and remains on atorvastatin.  He has essential hypertension and remote history of prostate cancer.  Doing quite well without concerns or complaints.  Did have a eye examination 2 months ago  Past Medical History  Diagnosis Date  . 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 CAROTID ARTERY STENOSIS 05/10/2010  . DIABETES MELLITUS, TYPE II 09/19/2007  . DISEASE, CEREBROVASCULAR NEC 03/05/2007  . GERD 03/13/2007  . HYPERLIPIDEMIA 03/05/2007  . HYPERTENSION 03/05/2007  . HYPOKALEMIA 11/09/2009  . KNEE PAIN, RIGHT 11/09/2009  . PROSTATE CANCER, HX OF 03/05/2007  . RENAL DISEASE, CHRONIC 02/03/2009     Social History   Social History  . Marital Status: Married    Spouse Name: N/A  . Number of Children: N/A  . Years of Education: N/A   Occupational History  . Not on file.   Social History Main Topics  . Smoking status: Former Smoker    Types: Cigarettes    Quit date: 08/22/1974  . Smokeless tobacco: Never Used  . Alcohol Use: No  . Drug Use: No  . Sexual Activity: Not on file   Other Topics Concern  . Not on file   Social History Narrative    Past Surgical History  Procedure Laterality Date  . Carotid artery angioplasty Right Oct. 10, 2001  . Laparoscopic appendectomy  02/20/2012    Procedure: APPENDECTOMY LAPAROSCOPIC;  Surgeon: Stark Klein, MD;  Location: [facility];  Service: General;  Laterality: N/A;  . Appendectomy  02-20-12  . Prostate surgery      prostatectomy    Family History  Problem Relation Age of Onset  . Hypertension Mother   . Cancer Father   . Cancer Brother     Allergies  Allergen Reactions  . Aspirin Other (See Comments)    High doses causes stomach ulcer and bleeding    Current  Outpatient Prescriptions on File Prior to Visit  Medication Sig Dispense Refill  . amlodipine-atorvastatin (CADUET) 10-20 MG tablet TAKE 1 TABLET BY MOUTH EVERY DAY 90 tablet 1  . aspirin 81 MG tablet Take 81 mg by mouth daily.      . clopidogrel (PLAVIX) 75 MG tablet TAKE 1 TABLET (75 MG TOTAL) BY MOUTH DAILY. 90 tablet 2  . esomeprazole (NEXIUM) 40 MG capsule TAKE 1 CAPSULE BY MOUTH ONCE DAILY 90 capsule 3  . glipiZIDE (GLUCOTROL XL) 5 MG 24 hr tablet TAKE 1 TABLET BY MOUTH EVERY DAY 90 tablet 3  . glucose blood (ONETOUCH VERIO) test strip USE TO CHECK BLOOD SUGAR DAILY AND AS NEEDED 100 each 3  . hydrocortisone-pramoxine (ANALPRAM-HC) 2.5-1 % rectal cream USE ONCE DAILY OR AS DIRECTED 30 g 2  . losartan-hydrochlorothiazide (HYZAAR) 100-25 MG tablet     . metFORMIN (GLUCOPHAGE-XR) 500 MG 24 hr tablet TAKE 1 TABLET EVERY DAY WITH BREAKFAST 90 tablet 1  . metoprolol succinate (TOPROL-XL) 25 MG 24 hr tablet Take 25 mg by mouth daily.     Glory Rosebush DELICA LANCETS 99991111 MISC USE TO CHECK BLOOD SUGAR DAILY AND AS NEEDED 200 each 1  . potassium chloride SA (KLOR-CON M20) 20 MEQ tablet TAKE 1 TABLET BY MOUTH EVERY DAY 100 tablet 3   No current  facility-administered medications on file prior to visit.    BP 140/70 mmHg  Pulse 60  Temp(Src) 98.3 F (36.8 C) (Oral)  Resp 20  Ht 5\' 7"  (1.702 m)  Wt 188 lb (85.276 kg)  BMI 29.44 kg/m2  SpO2 98%     Review of Systems  Constitutional: Negative for fever, chills, appetite change and fatigue.  HENT: Negative for congestion, dental problem, ear pain, hearing loss, sore throat, tinnitus, trouble swallowing and voice change.   Eyes: Negative for pain, discharge and visual disturbance.  Respiratory: Negative for cough, chest tightness, wheezing and stridor.   Cardiovascular: Negative for chest pain, palpitations and leg swelling.  Gastrointestinal: Negative for nausea, vomiting, abdominal pain, diarrhea, constipation, blood in stool and abdominal  distention.  Genitourinary: Negative for urgency, hematuria, flank pain, discharge, difficulty urinating and genital sores.  Musculoskeletal: Negative for myalgias, back pain, joint swelling, arthralgias, gait problem and neck stiffness.  Skin: Negative for rash.  Neurological: Negative for dizziness, syncope, speech difficulty, weakness, numbness and headaches.  Hematological: Negative for adenopathy. Does not bruise/bleed easily.  Psychiatric/Behavioral: Negative for behavioral problems and dysphoric mood. The patient is not nervous/anxious.        Objective:   Physical Exam  Constitutional: He is oriented to person, place, and time. He appears well-developed.  Blood pressure 140/64  HENT:  Head: Normocephalic.  Right Ear: External ear normal.  Left Ear: External ear normal.  Eyes: Conjunctivae and EOM are normal.  Neck: Normal range of motion.  Cardiovascular: Normal rate and normal heart sounds.   Pulmonary/Chest: Breath sounds normal.  Abdominal: Bowel sounds are normal.  Musculoskeletal: Normal range of motion. He exhibits no edema or tenderness.  Neurological: He is alert and oriented to person, place, and time.  Psychiatric: He has a normal mood and affect. His behavior is normal.          Assessment & Plan:   Diabetes mellitus.  Will check a hemoglobin A1c.  Continue present regimen which includes submaximal dose of metformin Chronic kidney disease.  Will recheck indices Essential hypertension, stable History of prostate cancer.  Will check PSA  Return in 3 months for follow-up  Nyoka Cowden, MD

## 2016-03-02 ENCOUNTER — Other Ambulatory Visit: Payer: Self-pay | Admitting: Internal Medicine

## 2016-03-21 ENCOUNTER — Other Ambulatory Visit: Payer: Self-pay | Admitting: Internal Medicine

## 2016-03-31 ENCOUNTER — Other Ambulatory Visit: Payer: Self-pay | Admitting: Internal Medicine

## 2016-03-31 NOTE — Telephone Encounter (Signed)
Rx refill sent to pharmacy. 

## 2016-04-02 ENCOUNTER — Other Ambulatory Visit: Payer: Self-pay | Admitting: Internal Medicine

## 2016-05-02 ENCOUNTER — Ambulatory Visit (INDEPENDENT_AMBULATORY_CARE_PROVIDER_SITE_OTHER): Payer: Medicare Other | Admitting: Internal Medicine

## 2016-05-02 ENCOUNTER — Encounter: Payer: Self-pay | Admitting: Internal Medicine

## 2016-05-02 VITALS — BP 150/70 | HR 67 | Temp 98.1°F | Resp 20 | Ht 67.0 in | Wt 186.5 lb

## 2016-05-02 DIAGNOSIS — I10 Essential (primary) hypertension: Secondary | ICD-10-CM | POA: Diagnosis not present

## 2016-05-02 DIAGNOSIS — E0821 Diabetes mellitus due to underlying condition with diabetic nephropathy: Secondary | ICD-10-CM

## 2016-05-02 DIAGNOSIS — R197 Diarrhea, unspecified: Secondary | ICD-10-CM | POA: Diagnosis not present

## 2016-05-02 NOTE — Progress Notes (Signed)
Subjective:    Patient ID: Austin [name], male    DOB: 10-Aug-1935, 81 y.o.   MRN: [id number]  HPI  81 year old patient who presents with a chief complaint of diarrhea. Stable medical problems include essential hypertension and diabetes.  Medical therapy does include metformin Diarrhea began 4 nights ago shortly after eating at a local restaurant, initially associated with nausea and vomiting but no fever or significant abdominal pain.  Diarrhea was stable over the weekend on a light diet, but at 4 AM this morning had several episodes of loose stool.  Throughout the day, he has felt gaseous, but has only had very mild low volume, watery stool.  Sporadically.  No fever or recurrent nausea or vomiting.  Denies any crampy abdominal pain  Past Medical History:  Diagnosis Date  . 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 CAROTID ARTERY STENOSIS 05/10/2010  . DIABETES MELLITUS, TYPE II 09/19/2007  . DISEASE, CEREBROVASCULAR NEC 03/05/2007  . GERD 03/13/2007  . HYPERLIPIDEMIA 03/05/2007  . HYPERTENSION 03/05/2007  . HYPOKALEMIA 11/09/2009  . KNEE PAIN, RIGHT 11/09/2009  . PROSTATE CANCER, HX OF 03/05/2007  . RENAL DISEASE, CHRONIC 02/03/2009     Social History   Social History  . Marital status: Married    Spouse name: N/A  . Number of children: N/A  . Years of education: N/A   Occupational History  . Not on file.   Social History Main Topics  . Smoking status: Former Smoker    Types: Cigarettes    Quit date: 08/22/1974  . Smokeless tobacco: Never Used  . Alcohol use No  . Drug use: No  . Sexual activity: Not on file   Other Topics Concern  . Not on file   Social History Narrative  . No narrative on file    Past Surgical History:  Procedure Laterality Date  . APPENDECTOMY  02-20-12  . CAROTID ARTERY ANGIOPLASTY Right Oct. 10, 2001  . LAPAROSCOPIC APPENDECTOMY  02/20/2012   Procedure: APPENDECTOMY LAPAROSCOPIC;  Surgeon: Stark Klein, MD;  Location: [facility];  Service: General;   Laterality: N/A;  . PROSTATE SURGERY     prostatectomy    Family History  Problem Relation Age of Onset  . Hypertension Mother   . Cancer Father   . Cancer Brother     Allergies  Allergen Reactions  . Aspirin Other (See Comments)    High doses causes stomach ulcer and bleeding    Current Outpatient Prescriptions on File Prior to Visit  Medication Sig Dispense Refill  . amlodipine-atorvastatin (CADUET) 10-20 MG tablet TAKE 1 TABLET BY MOUTH EVERY DAY 90 tablet 0  . aspirin 81 MG tablet Take 81 mg by mouth daily.      . clopidogrel (PLAVIX) 75 MG tablet TAKE 1 TABLET (75 MG TOTAL) BY MOUTH DAILY. 90 tablet 2  . esomeprazole (NEXIUM) 40 MG capsule TAKE 1 CAPSULE BY MOUTH ONCE DAILY 90 capsule 3  . glipiZIDE (GLUCOTROL XL) 5 MG 24 hr tablet TAKE 1 TABLET BY MOUTH EVERY DAY 90 tablet 3  . hydrocortisone-pramoxine (ANALPRAM-HC) 2.5-1 % rectal cream USE ONCE DAILY OR AS DIRECTED 30 g 0  . losartan-hydrochlorothiazide (HYZAAR) 100-25 MG tablet     . metFORMIN (GLUCOPHAGE-XR) 500 MG 24 hr tablet TAKE 1 TABLET EVERY DAY WITH BREAKFAST 90 tablet 1  . metoprolol succinate (TOPROL-XL) 25 MG 24 hr tablet Take 25 mg by mouth daily.     Glory Rosebush DELICA LANCETS 40H MISC USE TO CHECK BLOOD SUGAR  DAILY AND AS NEEDED 200 each 1  . ONETOUCH VERIO test strip USE TO CHECK BLOOD SUGAR DAILY AND AS NEEDED 100 each 6  . potassium chloride SA (KLOR-CON M20) 20 MEQ tablet TAKE 1 TABLET BY MOUTH EVERY DAY 100 tablet 3   No current facility-administered medications on file prior to visit.     BP (!) 150/70 (BP Location: Left Arm, Patient Position: Sitting, Cuff Size: Normal)   Pulse 67   Temp 98.1 F (36.7 C) (Oral)   Resp 20   Ht 5\' 7"  (1.702 m)   Wt 186 lb 8 oz (84.6 kg)   SpO2 98%   BMI 29.21 kg/m     Review of Systems  Constitutional: Negative for appetite change, chills, fatigue and fever.  HENT: Negative for congestion, dental problem, ear pain, hearing loss, sore throat, tinnitus,  trouble swallowing and voice change.   Eyes: Negative for pain, discharge and visual disturbance.  Respiratory: Negative for cough, chest tightness, wheezing and stridor.   Cardiovascular: Negative for chest pain, palpitations and leg swelling.  Gastrointestinal: Positive for diarrhea, nausea and vomiting. Negative for abdominal distention, abdominal pain, blood in stool and constipation.  Genitourinary: Negative for difficulty urinating, discharge, flank pain, genital sores, hematuria and urgency.  Musculoskeletal: Negative for arthralgias, back pain, gait problem, joint swelling, myalgias and neck stiffness.  Skin: Negative for rash.  Neurological: Negative for dizziness, syncope, speech difficulty, weakness, numbness and headaches.  Hematological: Negative for adenopathy. Does not bruise/bleed easily.  Psychiatric/Behavioral: Negative for behavioral problems and dysphoric mood. The patient is not nervous/anxious.        Objective:   Physical Exam  Constitutional: He is oriented to person, place, and time. He appears well-developed.  HENT:  Head: Normocephalic.  Right Ear: External ear normal.  Left Ear: External ear normal.  Appears well-hydrated  Eyes: Conjunctivae and EOM are normal.  Neck: Normal range of motion.  Cardiovascular: Normal rate and normal heart sounds.   Pulmonary/Chest: Breath sounds normal.  Abdominal: Bowel sounds are normal. He exhibits no distension and no mass. There is no tenderness. There is no rebound and no guarding.  Musculoskeletal: Normal range of motion. He exhibits no edema or tenderness.  Neurological: He is alert and oriented to person, place, and time.  Psychiatric: He has a normal mood and affect. His behavior is normal.          Assessment & Plan:   Resolving diarrhea.  Probable resolving mild viral gastroenteritis.  Will continue light diet.  Will slowly advance.  Will report any new or worsening symptoms Essential  hypertension Diabetes, well controlled.  Will hold metformin for a couple of days  Nyoka Cowden

## 2016-05-02 NOTE — Patient Instructions (Addendum)

## 2016-05-02 NOTE — Progress Notes (Signed)
Pre visit review using our clinic review tool, if applicable. No additional management support is needed unless otherwise documented below in the visit note. 

## 2016-05-06 ENCOUNTER — Other Ambulatory Visit: Payer: Self-pay | Admitting: Internal Medicine

## 2016-06-01 ENCOUNTER — Other Ambulatory Visit: Payer: Self-pay | Admitting: Internal Medicine

## 2016-06-01 ENCOUNTER — Ambulatory Visit (HOSPITAL_COMMUNITY)
Admission: EM | Admit: 2016-06-01 | Discharge: 2016-06-01 | Disposition: A | Discharge status: Home / Self Care | Payer: Medicare Other | Attending: Physician Assistant | Admitting: Physician Assistant

## 2016-06-01 ENCOUNTER — Ambulatory Visit (INDEPENDENT_AMBULATORY_CARE_PROVIDER_SITE_OTHER): Payer: Medicare Other

## 2016-06-01 ENCOUNTER — Encounter (HOSPITAL_COMMUNITY): Payer: Self-pay | Admitting: Emergency Medicine

## 2016-06-01 DIAGNOSIS — K5900 Constipation, unspecified: Secondary | ICD-10-CM

## 2016-06-01 DIAGNOSIS — R140 Abdominal distension (gaseous): Secondary | ICD-10-CM

## 2016-06-01 NOTE — ED Provider Notes (Signed)
CSN: [id number]     Arrival date & time 06/01/16  1729 History   First MD Initiated Contact with Patient 06/01/16 1807     Chief Complaint  Patient presents with  . Abdominal Pain  . Gastroesophageal Reflux   (Consider location/radiation/quality/duration/timing/severity/associated sxs/prior Treatment) HPI ABDO PAIN WITH BLOATING, UNABLE TO PASS GAS. SXS PRESENT FOR 3 DAYS NOT GETTING BETTER. LAST BM ABOUT 4 DAYS AGO. HX OF DIVERTICULITIS. NO BOWEL ISSUES OR OBSTRUCTION. PAIN SCORE IS 3.  Past Medical History:  Diagnosis Date  . 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 CAROTID ARTERY STENOSIS 05/10/2010  . DIABETES MELLITUS, TYPE II 09/19/2007  . DISEASE, CEREBROVASCULAR NEC 03/05/2007  . GERD 03/13/2007  . HYPERLIPIDEMIA 03/05/2007  . HYPERTENSION 03/05/2007  . HYPOKALEMIA 11/09/2009  . KNEE PAIN, RIGHT 11/09/2009  . PROSTATE CANCER, HX OF 03/05/2007  . RENAL DISEASE, CHRONIC 02/03/2009   Past Surgical History:  Procedure Laterality Date  . APPENDECTOMY  02-20-12  . CAROTID ARTERY ANGIOPLASTY Right Oct. 10, 2001  . LAPAROSCOPIC APPENDECTOMY  02/20/2012   Procedure: APPENDECTOMY LAPAROSCOPIC;  Surgeon: Stark Klein, MD;  Location: [facility];  Service: General;  Laterality: N/A;  . PROSTATE SURGERY     prostatectomy   Family History  Problem Relation Age of Onset  . Hypertension Mother   . Cancer Father   . Cancer Brother    Social History  Substance Use Topics  . Smoking status: Former Smoker    Types: Cigarettes    Quit date: 08/22/1974  . Smokeless tobacco: Never Used  . Alcohol use No    Review of Systems  Denies: HEADACHE, NAUSEA,  CHEST PAIN, CONGESTION, DYSURIA, SHORTNESS OF BREATH  Allergies  Aspirin  Home Medications   Prior to Admission medications   Medication Sig Start Date End Date Taking? Authorizing Provider  amlodipine-atorvastatin (CADUET) 10-20 MG tablet TAKE 1 TABLET BY MOUTH EVERY DAY 03/31/16   Marletta Lor, MD  aspirin 81 MG tablet Take 81 mg by  mouth daily.      Historical Provider, MD  clopidogrel (PLAVIX) 75 MG tablet TAKE 1 TABLET (75 MG TOTAL) BY MOUTH DAILY. 10/26/15   Marletta Lor, MD  esomeprazole (NEXIUM) 40 MG capsule TAKE 1 CAPSULE BY MOUTH ONCE DAILY 03/21/16   Marletta Lor, MD  glipiZIDE (GLUCOTROL XL) 5 MG 24 hr tablet TAKE 1 TABLET BY MOUTH EVERY DAY 12/18/15   Marletta Lor, MD  hydrocortisone-pramoxine [facility]) 2.5-1 % rectal cream USE ONCE DAILY OR AS DIRECTED 05/06/16   Marletta Lor, MD  KLOR-CON M20 20 MEQ tablet TAKE 1 TABLET BY MOUTH EVERY DAY 06/01/16   Marletta Lor, MD  losartan-hydrochlorothiazide [facility]) 100-25 MG tablet  08/09/15   Historical Provider, MD  metFORMIN (GLUCOPHAGE-XR) 500 MG 24 hr tablet TAKE 1 TABLET EVERY DAY WITH BREAKFAST 02/01/16   Marletta Lor, MD  metoprolol succinate (TOPROL-XL) 25 MG 24 hr tablet Take 25 mg by mouth daily.  10/03/13   Historical Provider, MD  Jonetta Speak LANCETS 31D [place] USE TO CHECK BLOOD SUGAR DAILY AND AS NEEDED 11/09/15   Marletta Lor, MD  [facility] VERIO test strip USE TO CHECK BLOOD SUGAR DAILY AND AS NEEDED 04/04/16   Marletta Lor, MD   Meds Ordered and Administered this Visit  Medications - No data to display  BP 199/83 (BP Location: Left Arm) Comment: reported elevated BP to PA [employer]  Pulse 83   Temp 98.9 F (37.2 C) (Oral)  Resp 18   SpO2 98%  No data found.   Physical Exam NURSES NOTES AND VITAL SIGNS REVIEWED. CONSTITUTIONAL: Well developed, well nourished, no acute distress HEENT: normocephalic, atraumatic EYES: Conjunctiva normal NECK:normal ROM, supple, no adenopathy PULMONARY:No respiratory distress, normal effort ABDOMINAL: Soft, ND, NT HIGH PITCHED BS No CVAT, NO REBOUND NO GUARDING. MUSCULOSKELETAL: Normal ROM of all extremities,  SKIN: warm and dry without rash PSYCHIATRIC: Mood and affect, behavior are normal  Urgent Care Course   Clinical Course    Procedures  (including critical care time)  Labs Review Labs Reviewed - No data to display  Imaging Review Dg Abd 1 View  Result Date: 06/01/2016 CLINICAL DATA:  Stomach pain, feels bloated EXAM: ABDOMEN - 1 VIEW COMPARISON:  12/06/2014, CT scan February 20, 2012 FINDINGS: Supine view abdomen demonstrates mild nonspecific gaseous distention of small bowel centrally. Scattered gas present within the colon. Large amount of stool in the right colon. Debris present within the stomach. Surgical clips in the pelvis. IMPRESSION: Nonspecific gas pattern Electronically Signed   By: Donavan Foil M.D.   On: 06/01/2016 18:41     Visual Acuity Review  Right Eye Distance:   Left Eye Distance:   Bilateral Distance:    Right Eye Near:   Left Eye Near:    Bilateral Near:         MDM   1. Constipation, unspecified constipation type   2. Abdominal bloating     Patient is reassured that there are no issues that require transfer to higher level of care at this time or additional tests. Patient is advised to continue home symptomatic treatment. Patient is advised that if there are new or worsening symptoms to attend the emergency department, contact primary care provider, or return to UC. Instructions of care provided discharged home in stable condition.    THIS NOTE WAS GENERATED USING A VOICE RECOGNITION SOFTWARE PROGRAM. ALL REASONABLE EFFORTS  WERE MADE TO PROOFREAD THIS DOCUMENT FOR ACCURACY.  I have verbally reviewed the discharge instructions with the patient. A printed AVS was given to the patient.  All questions were answered prior to discharge.      Konrad Felix, [place] 06/01/16 2029

## 2016-06-01 NOTE — ED Triage Notes (Signed)
The patient presented to the [facility] with a complaint of epigastric pain and reflux that started yesterday.

## 2016-06-01 NOTE — Discharge Instructions (Signed)
1. Drink at least 8 large glasses of water daily and continue regular activities as                 tolerated.  °2. With each subsequent day with dealing with the constipation, please continue all the                ones that was/were started the previous day and add 1 new thing each day ° A 1 cup of prune juice and orange juice (warmed) orally twice daily ° B. mirlax 17 grams in large glass of water and follow with another glass of water                 up to twice daily. ° C.senna-S 2 orally up to twice daily ° D.mil of magnesia 30 ml orally up to twice daily ° E. Fleet enema rectally as needed ° °3. Once your bowel has moved, you can cut down on the above to regular bowl habit. Most people should have a movement every 1-2 days °4. NOTE: Do not start a fiber supplement during an episode of constipation. ° °

## 2016-06-13 ENCOUNTER — Encounter: Payer: Self-pay | Admitting: Internal Medicine

## 2016-06-13 ENCOUNTER — Ambulatory Visit (INDEPENDENT_AMBULATORY_CARE_PROVIDER_SITE_OTHER): Payer: Medicare Other | Admitting: Internal Medicine

## 2016-06-13 VITALS — BP 160/72 | HR 60 | Temp 98.4°F | Resp 20 | Ht 67.0 in | Wt 186.0 lb

## 2016-06-13 DIAGNOSIS — E0821 Diabetes mellitus due to underlying condition with diabetic nephropathy: Secondary | ICD-10-CM | POA: Diagnosis not present

## 2016-06-13 DIAGNOSIS — I10 Essential (primary) hypertension: Secondary | ICD-10-CM

## 2016-06-13 DIAGNOSIS — N289 Disorder of kidney and ureter, unspecified: Secondary | ICD-10-CM

## 2016-06-13 NOTE — Patient Instructions (Signed)
Limit your sodium (Salt) intake   Please check your hemoglobin A1c every 3 months  Please check your blood pressure on a regular basis.  If it is consistently greater than 150/90, please make an office appointment.    It is important that you exercise regularly, at least 20 minutes 3 to 4 times per week.  If you develop chest pain or shortness of breath seek  medical attention.   

## 2016-06-13 NOTE — Progress Notes (Signed)
Subjective:    Patient ID: Austin [name], male    DOB: 15-Mar-1935, 81 y.o.   MRN: [id number]  HPI  81 year old patient who is seen today for follow-up.  He has been seen by cardiology recently and apparently had a hemoglobin A1c as well as renal function studies. Last month he is also seen by urology and did have an eye examination. The patient does have a history of chronic kidney disease.  His last creatinine clearance about 4 months ago was 47. The patient has discontinued metformin per cardiology recommendation.  He was on a low submaximal dose. His most recent hemoglobin A1c is not available but was 7.0  79 months ago. He has had some issues with diarrhea on metformin therapy.  He had a recent ED visit due to significant constipation.  Past Medical History:  Diagnosis Date  . 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 CAROTID ARTERY STENOSIS 05/10/2010  . DIABETES MELLITUS, TYPE II 09/19/2007  . DISEASE, CEREBROVASCULAR NEC 03/05/2007  . GERD 03/13/2007  . HYPERLIPIDEMIA 03/05/2007  . HYPERTENSION 03/05/2007  . HYPOKALEMIA 11/09/2009  . KNEE PAIN, RIGHT 11/09/2009  . PROSTATE CANCER, HX OF 03/05/2007  . RENAL DISEASE, CHRONIC 02/03/2009     Social History   Social History  . Marital status: Married    Spouse name: N/A  . Number of children: N/A  . Years of education: N/A   Occupational History  . Not on file.   Social History Main Topics  . Smoking status: Former Smoker    Types: Cigarettes    Quit date: 08/22/1974  . Smokeless tobacco: Never Used  . Alcohol use No  . Drug use: No  . Sexual activity: Not on file   Other Topics Concern  . Not on file   Social History Narrative  . No narrative on file    Past Surgical History:  Procedure Laterality Date  . APPENDECTOMY  02-20-12  . CAROTID ARTERY ANGIOPLASTY Right Oct. 10, 2001  . LAPAROSCOPIC APPENDECTOMY  02/20/2012   Procedure: APPENDECTOMY LAPAROSCOPIC;  Surgeon: Stark Klein, MD;  Location: [facility];  Service:  General;  Laterality: N/A;  . PROSTATE SURGERY     prostatectomy    Family History  Problem Relation Age of Onset  . Hypertension Mother   . Cancer Father   . Cancer Brother     Allergies  Allergen Reactions  . Aspirin Other (See Comments)    High doses causes stomach ulcer and bleeding    Current Outpatient Prescriptions on File Prior to Visit  Medication Sig Dispense Refill  . amlodipine-atorvastatin (CADUET) 10-20 MG tablet TAKE 1 TABLET BY MOUTH EVERY DAY 90 tablet 0  . aspirin 81 MG tablet Take 81 mg by mouth daily.      . clopidogrel (PLAVIX) 75 MG tablet TAKE 1 TABLET (75 MG TOTAL) BY MOUTH DAILY. 90 tablet 2  . esomeprazole (NEXIUM) 40 MG capsule TAKE 1 CAPSULE BY MOUTH ONCE DAILY 90 capsule 3  . glipiZIDE (GLUCOTROL XL) 5 MG 24 hr tablet TAKE 1 TABLET BY MOUTH EVERY DAY 90 tablet 3  . hydrocortisone-pramoxine (ANALPRAM-HC) 2.5-1 % rectal cream USE ONCE DAILY OR AS DIRECTED 30 g 2  . KLOR-CON M20 20 MEQ tablet TAKE 1 TABLET BY MOUTH EVERY DAY 100 tablet 0  . losartan-hydrochlorothiazide (HYZAAR) 100-25 MG tablet     . metFORMIN (GLUCOPHAGE-XR) 500 MG 24 hr tablet TAKE 1 TABLET EVERY DAY WITH BREAKFAST 90 tablet 1  . metoprolol succinate (TOPROL-XL)  25 MG 24 hr tablet Take 25 mg by mouth daily.     Glory Rosebush DELICA LANCETS 32K MISC USE TO CHECK BLOOD SUGAR DAILY AND AS NEEDED 200 each 1  . ONETOUCH VERIO test strip USE TO CHECK BLOOD SUGAR DAILY AND AS NEEDED 100 each 6   No current facility-administered medications on file prior to visit.     BP (!) 160/72 (BP Location: Left Arm, Patient Position: Sitting, Cuff Size: Normal)   Pulse 60   Temp 98.4 F (36.9 C) (Oral)   Resp 20   Ht 5\' 7"  (1.702 m)   Wt 186 lb (84.4 kg)   SpO2 98%   BMI 29.13 kg/m    Review of Systems  Constitutional: Negative for appetite change, chills, fatigue and fever.  HENT: Negative for congestion, dental problem, ear pain, hearing loss, sore throat, tinnitus, trouble swallowing and  voice change.   Eyes: Negative for pain, discharge and visual disturbance.  Respiratory: Negative for cough, chest tightness, wheezing and stridor.   Cardiovascular: Negative for chest pain, palpitations and leg swelling.  Gastrointestinal: Negative for abdominal distention, abdominal pain, blood in stool, constipation, diarrhea, nausea and vomiting.  Genitourinary: Negative for difficulty urinating, discharge, flank pain, genital sores, hematuria and urgency.  Musculoskeletal: Negative for arthralgias, back pain, gait problem, joint swelling, myalgias and neck stiffness.  Skin: Negative for rash.  Neurological: Negative for dizziness, syncope, speech difficulty, weakness, numbness and headaches.  Hematological: Negative for adenopathy. Does not bruise/bleed easily.  Psychiatric/Behavioral: Negative for behavioral problems and dysphoric mood. The patient is not nervous/anxious.        Objective:   Physical Exam  Constitutional: He is oriented to person, place, and time. He appears well-developed.  HENT:  Head: Normocephalic.  Right Ear: External ear normal.  Left Ear: External ear normal.  Eyes: Conjunctivae and EOM are normal.  Neck: Normal range of motion.  Cardiovascular: Normal rate and normal heart sounds.   Pulmonary/Chest: Breath sounds normal.  Abdominal: Bowel sounds are normal.  Musculoskeletal: Normal range of motion. He exhibits no edema or tenderness.  Neurological: He is alert and oriented to person, place, and time.  Psychiatric: He has a normal mood and affect. His behavior is normal.          Assessment & Plan:   Dabetes mellitus.  Unclear control. We'll review hemoglobin A1c  Chronic kidney disease.  Will review recent renal indices.  We'll maintain off metformin therapy at this time  Essential hypertension, well-controlled   Recheck 3 months  Nyoka Cowden

## 2016-06-20 ENCOUNTER — Ambulatory Visit (INDEPENDENT_AMBULATORY_CARE_PROVIDER_SITE_OTHER): Payer: Medicare Other | Admitting: *Deleted

## 2016-06-20 DIAGNOSIS — Z23 Encounter for immunization: Secondary | ICD-10-CM

## 2016-07-05 ENCOUNTER — Other Ambulatory Visit: Payer: Self-pay | Admitting: Internal Medicine

## 2016-07-27 ENCOUNTER — Other Ambulatory Visit: Payer: Self-pay | Admitting: Internal Medicine

## 2016-08-05 ENCOUNTER — Other Ambulatory Visit: Payer: Self-pay | Admitting: Internal Medicine

## 2016-08-28 ENCOUNTER — Other Ambulatory Visit: Payer: Self-pay | Admitting: Internal Medicine

## 2016-09-07 ENCOUNTER — Ambulatory Visit: Payer: Medicare Other | Admitting: Internal Medicine

## 2016-09-09 ENCOUNTER — Ambulatory Visit (INDEPENDENT_AMBULATORY_CARE_PROVIDER_SITE_OTHER): Payer: Medicare Other | Admitting: Internal Medicine

## 2016-09-09 ENCOUNTER — Encounter: Payer: Self-pay | Admitting: Internal Medicine

## 2016-09-09 ENCOUNTER — Ambulatory Visit: Payer: Medicare Other | Admitting: Internal Medicine

## 2016-09-09 VITALS — BP 152/64 | HR 66 | Temp 98.8°F | Ht 67.0 in | Wt 188.8 lb

## 2016-09-09 DIAGNOSIS — E1122 Type 2 diabetes mellitus with diabetic chronic kidney disease: Secondary | ICD-10-CM

## 2016-09-09 DIAGNOSIS — N189 Chronic kidney disease, unspecified: Secondary | ICD-10-CM

## 2016-09-09 DIAGNOSIS — E785 Hyperlipidemia, unspecified: Secondary | ICD-10-CM

## 2016-09-09 DIAGNOSIS — H6122 Impacted cerumen, left ear: Secondary | ICD-10-CM

## 2016-09-09 DIAGNOSIS — I10 Essential (primary) hypertension: Secondary | ICD-10-CM

## 2016-09-09 DIAGNOSIS — E0821 Diabetes mellitus due to underlying condition with diabetic nephropathy: Secondary | ICD-10-CM

## 2016-09-09 DIAGNOSIS — D500 Iron deficiency anemia secondary to blood loss (chronic): Secondary | ICD-10-CM

## 2016-09-09 LAB — BASIC METABOLIC PANEL
BUN: 31 mg/dL — AB (ref 6–23)
CALCIUM: 10.3 mg/dL (ref 8.4–10.5)
CO2: 31 mEq/L (ref 19–32)
Chloride: 100 mEq/L (ref 96–112)
Creatinine, Ser: 2.2 mg/dL — ABNORMAL HIGH (ref 0.40–1.50)
GFR: 37.22 mL/min — AB (ref >60.00)
GLUCOSE: 116 mg/dL — AB (ref 70–99)
Potassium: 3.3 mEq/L — ABNORMAL LOW (ref 3.5–5.1)
SODIUM: 140 meq/L (ref 135–145)

## 2016-09-09 LAB — HEMOGLOBIN A1C: Hgb A1c MFr Bld: 7.2 % — ABNORMAL HIGH (ref 4.6–6.5)

## 2016-09-09 MED ORDER — METOPROLOL SUCCINATE ER 25 MG PO TB24: 50.0000 mg | ORAL_TABLET | Freq: Every day | ORAL | 5 refills | Status: DC

## 2016-09-09 NOTE — Patient Instructions (Addendum)
Limit your sodium (Salt) intake  Please check your blood pressure on a regular basis.  If it is consistently greater than 150/90, please make an office appointment.    It is important that you exercise regularly, at least 20 minutes 3 to 4 times per week.  If you develop chest pain or shortness of breath seek  medical attention.   Please check your hemoglobin A1c every 3 months  Increase metoprolol to 50 mg once daily  Whether in [place], [place]

## 2016-09-09 NOTE — Progress Notes (Signed)
Pre visit review using our clinic review tool, if applicable. No additional management support is needed unless otherwise documented below in the visit note. 

## 2016-09-09 NOTE — Progress Notes (Signed)
Subjective:    Patient ID: Austin [name], male    DOB: 1934-10-04, 82 y.o.   MRN: [id number]  HPI 82 year old patient who is seen today for follow-up of type 2 diabetes.  He has a history of essential hypertension and carotid artery disease.  He has chronic kidney disease He states fasting blood sugars are generally less than 120. His only complaint today is decreased auditory acuity on the left.  Lab Results  Component Value Date   HGBA1C 7.0 (H) 11/11/2015     Wt Readings from Last 3 Encounters:  09/09/16 188 lb 12.8 oz (85.6 kg)  06/13/16 186 lb (84.4 kg)  05/02/16 186 lb 8 oz (84.6 kg)   He is followed by cardiology.  Due to suboptimal control,  Doxazosin 1 mg at bedtime added to his blood pressure regimen.  The patient has been fearful of hypotension and syncope and has not started this medication.  Blood pressure today 148 over 70.  He generally feels well No history of hypoglycemia  Cardiac status has been stable and denies any focal neurological symptoms He is followed by general ophthalmology once a year and by retinal specialist 3 times annually  Patient did have a nuclear stress test performed 11 months ago His last carotid artery Doppler study was in 2016 and revealed stenosis of less than 40%.  Past Medical History:  Diagnosis Date  . 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 CAROTID ARTERY STENOSIS 05/10/2010  . DIABETES MELLITUS, TYPE II 09/19/2007  . DISEASE, CEREBROVASCULAR NEC 03/05/2007  . GERD 03/13/2007  . HYPERLIPIDEMIA 03/05/2007  . HYPERTENSION 03/05/2007  . HYPOKALEMIA 11/09/2009  . KNEE PAIN, RIGHT 11/09/2009  . PROSTATE CANCER, HX OF 03/05/2007  . RENAL DISEASE, CHRONIC 02/03/2009     Social History   Social History  . Marital status: Married    Spouse name: N/A  . Number of children: N/A  . Years of education: N/A   Occupational History  . Not on file.   Social History Main Topics  . Smoking status: Former Smoker    Types: Cigarettes   Quit date: 08/22/1974  . Smokeless tobacco: Never Used  . Alcohol use No  . Drug use: No  . Sexual activity: Not on file   Other Topics Concern  . Not on file   Social History Narrative  . No narrative on file    Past Surgical History:  Procedure Laterality Date  . APPENDECTOMY  02-20-12  . CAROTID ARTERY ANGIOPLASTY Right Oct. 10, 2001  . LAPAROSCOPIC APPENDECTOMY  02/20/2012   Procedure: APPENDECTOMY LAPAROSCOPIC;  Surgeon: Stark Klein, MD;  Location: [facility];  Service: General;  Laterality: N/A;  . PROSTATE SURGERY     prostatectomy    Family History  Problem Relation Age of Onset  . Hypertension Mother   . Cancer Father   . Cancer Brother     Allergies  Allergen Reactions  . Aspirin Other (See Comments)    High doses causes stomach ulcer and bleeding    Current Outpatient Prescriptions on File Prior to Visit  Medication Sig Dispense Refill  . amlodipine-atorvastatin (CADUET) 10-20 MG tablet TAKE 1 TABLET BY MOUTH EVERY DAY 90 tablet 1  . aspirin 81 MG tablet Take 81 mg by mouth daily.      . clopidogrel (PLAVIX) 75 MG tablet TAKE 1 TABLET (75 MG TOTAL) BY MOUTH DAILY. 90 tablet 2  . esomeprazole (NEXIUM) 40 MG capsule TAKE 1 CAPSULE BY MOUTH ONCE DAILY 90  capsule 3  . glipiZIDE (GLUCOTROL XL) 5 MG 24 hr tablet TAKE 1 TABLET BY MOUTH EVERY DAY 90 tablet 3  . hydrocortisone-pramoxine (ANALPRAM-HC) 2.5-1 % rectal cream USE ONCE DAILY OR AS DIRECTED 30 g 2  . KLOR-CON M20 20 MEQ tablet TAKE 1 TABLET BY MOUTH EVERY DAY 100 tablet 0  . losartan-hydrochlorothiazide (HYZAAR) 100-25 MG tablet     . metFORMIN (GLUCOPHAGE-XR) 500 MG 24 hr tablet TAKE 1 TABLET EVERY DAY WITH BREAKFAST 90 tablet 1  . metoprolol succinate (TOPROL-XL) 25 MG 24 hr tablet Take 25 mg by mouth daily.     [facility] DELICA LANCETS 35T MISC USE TO CHECK BLOOD SUGAR DAILY AND AS NEEDED 200 each 1  . ONETOUCH VERIO test strip USE TO CHECK BLOOD SUGAR DAILY AND AS NEEDED 100 each 6   No current  facility-administered medications on file prior to visit.     BP (!) 152/64 (BP Location: Right Arm, Patient Position: Sitting, Cuff Size: Normal)   Pulse 66   Temp 98.8 F (37.1 C) (Oral)   Ht 5\' 7"  (1.702 m)   Wt 188 lb 12.8 oz (85.6 kg)   SpO2 97%   BMI 29.57 kg/m             Review of Systems  Constitutional: Negative for appetite change, chills, fatigue and fever.  HENT: Positive for hearing loss. Negative for congestion, dental problem, ear pain, sore throat, tinnitus, trouble swallowing and voice change.   Eyes: Negative for pain, discharge and visual disturbance.  Respiratory: Negative for cough, chest tightness, wheezing and stridor.   Cardiovascular: Negative for chest pain, palpitations and leg swelling.  Gastrointestinal: Negative for abdominal distention, abdominal pain, blood in stool, constipation, diarrhea, nausea and vomiting.  Genitourinary: Negative for difficulty urinating, discharge, flank pain, genital sores, hematuria and urgency.  Musculoskeletal: Negative for arthralgias, back pain, gait problem, joint swelling, myalgias and neck stiffness.  Skin: Negative for rash.  Neurological: Negative for dizziness, syncope, speech difficulty, weakness, numbness and headaches.  Hematological: Negative for adenopathy. Does not bruise/bleed easily.  Psychiatric/Behavioral: Negative for behavioral problems and dysphoric mood. The patient is not nervous/anxious.        Objective:   Physical Exam  Constitutional: He is oriented to person, place, and time. He appears well-developed. No distress.  Blood pressure 148/70  HENT:  Head: Normocephalic.  Right Ear: External ear normal.  Left Ear: External ear normal.  Left canal with cerumen  Eyes: Conjunctivae and EOM are normal.  Neck: Normal range of motion.  Cardiovascular: Normal rate and normal heart sounds.   Pulmonary/Chest: Breath sounds normal.  Abdominal: Bowel sounds are normal.  Musculoskeletal: Normal  range of motion. He exhibits no edema or tenderness.  Neurological: He is alert and oriented to person, place, and time.  Psychiatric: He has a normal mood and affect. His behavior is normal.          Assessment & Plan:   Left-sided cerumen impaction Diabetes mellitus.  Will check hemoglobin A1c Chronic kidney disease.  Will check renal indices Essential hypertension.  Patient is reluctant to start doxazosin.  We'll increase metoprolol to 50 mg daily  Weight loss encouraged Follow-up ophthalmology Return in 3 months for follow-up  Left canal irrigated until clear  Nyoka Cowden

## 2016-11-03 ENCOUNTER — Encounter (HOSPITAL_COMMUNITY): Payer: Self-pay | Admitting: Emergency Medicine

## 2016-11-03 ENCOUNTER — Emergency Department (HOSPITAL_COMMUNITY): Payer: Medicare Other

## 2016-11-03 ENCOUNTER — Observation Stay (HOSPITAL_COMMUNITY): Payer: Medicare Other

## 2016-11-03 ENCOUNTER — Observation Stay (HOSPITAL_COMMUNITY)
Admission: EM | Admit: 2016-11-03 | Discharge: 2016-11-05 | Disposition: A | Discharge status: Home / Self Care | Payer: Medicare Other | Attending: Internal Medicine | Admitting: Internal Medicine

## 2016-11-03 DIAGNOSIS — D62 Acute posthemorrhagic anemia: Secondary | ICD-10-CM | POA: Diagnosis not present

## 2016-11-03 DIAGNOSIS — R938 Abnormal findings on diagnostic imaging of other specified body structures: Secondary | ICD-10-CM | POA: Diagnosis not present

## 2016-11-03 DIAGNOSIS — E785 Hyperlipidemia, unspecified: Secondary | ICD-10-CM | POA: Diagnosis not present

## 2016-11-03 DIAGNOSIS — I6529 Occlusion and stenosis of unspecified carotid artery: Secondary | ICD-10-CM | POA: Insufficient documentation

## 2016-11-03 DIAGNOSIS — K921 Melena: Secondary | ICD-10-CM | POA: Diagnosis present

## 2016-11-03 DIAGNOSIS — Z87891 Personal history of nicotine dependence: Secondary | ICD-10-CM | POA: Insufficient documentation

## 2016-11-03 DIAGNOSIS — E1122 Type 2 diabetes mellitus with diabetic chronic kidney disease: Secondary | ICD-10-CM | POA: Insufficient documentation

## 2016-11-03 DIAGNOSIS — N400 Enlarged prostate without lower urinary tract symptoms: Secondary | ICD-10-CM | POA: Diagnosis not present

## 2016-11-03 DIAGNOSIS — Z7982 Long term (current) use of aspirin: Secondary | ICD-10-CM | POA: Diagnosis not present

## 2016-11-03 DIAGNOSIS — E876 Hypokalemia: Secondary | ICD-10-CM | POA: Diagnosis not present

## 2016-11-03 DIAGNOSIS — E1151 Type 2 diabetes mellitus with diabetic peripheral angiopathy without gangrene: Secondary | ICD-10-CM | POA: Insufficient documentation

## 2016-11-03 DIAGNOSIS — Z7984 Long term (current) use of oral hypoglycemic drugs: Secondary | ICD-10-CM | POA: Insufficient documentation

## 2016-11-03 DIAGNOSIS — Z79899 Other long term (current) drug therapy: Secondary | ICD-10-CM | POA: Insufficient documentation

## 2016-11-03 DIAGNOSIS — K2950 Unspecified chronic gastritis without bleeding: Secondary | ICD-10-CM | POA: Insufficient documentation

## 2016-11-03 DIAGNOSIS — I129 Hypertensive chronic kidney disease with stage 1 through stage 4 chronic kidney disease, or unspecified chronic kidney disease: Secondary | ICD-10-CM | POA: Diagnosis not present

## 2016-11-03 DIAGNOSIS — N179 Acute kidney failure, unspecified: Secondary | ICD-10-CM | POA: Diagnosis not present

## 2016-11-03 DIAGNOSIS — R918 Other nonspecific abnormal finding of lung field: Secondary | ICD-10-CM | POA: Diagnosis not present

## 2016-11-03 DIAGNOSIS — N183 Chronic kidney disease, stage 3 (moderate): Secondary | ICD-10-CM | POA: Diagnosis not present

## 2016-11-03 DIAGNOSIS — Z7902 Long term (current) use of antithrombotics/antiplatelets: Secondary | ICD-10-CM | POA: Insufficient documentation

## 2016-11-03 DIAGNOSIS — E870 Hyperosmolality and hypernatremia: Secondary | ICD-10-CM | POA: Diagnosis not present

## 2016-11-03 DIAGNOSIS — I2510 Atherosclerotic heart disease of native coronary artery without angina pectoris: Secondary | ICD-10-CM | POA: Diagnosis not present

## 2016-11-03 DIAGNOSIS — K317 Polyp of stomach and duodenum: Secondary | ICD-10-CM | POA: Diagnosis not present

## 2016-11-03 DIAGNOSIS — K219 Gastro-esophageal reflux disease without esophagitis: Secondary | ICD-10-CM | POA: Diagnosis not present

## 2016-11-03 DIAGNOSIS — I10 Essential (primary) hypertension: Secondary | ICD-10-CM | POA: Diagnosis present

## 2016-11-03 DIAGNOSIS — E1169 Type 2 diabetes mellitus with other specified complication: Secondary | ICD-10-CM | POA: Diagnosis present

## 2016-11-03 DIAGNOSIS — E0821 Diabetes mellitus due to underlying condition with diabetic nephropathy: Secondary | ICD-10-CM | POA: Diagnosis not present

## 2016-11-03 DIAGNOSIS — I700 Atherosclerosis of aorta: Secondary | ICD-10-CM | POA: Diagnosis not present

## 2016-11-03 DIAGNOSIS — R9389 Abnormal findings on diagnostic imaging of other specified body structures: Secondary | ICD-10-CM

## 2016-11-03 DIAGNOSIS — Z8711 Personal history of peptic ulcer disease: Secondary | ICD-10-CM | POA: Insufficient documentation

## 2016-11-03 DIAGNOSIS — E1129 Type 2 diabetes mellitus with other diabetic kidney complication: Secondary | ICD-10-CM | POA: Diagnosis present

## 2016-11-03 DIAGNOSIS — R109 Unspecified abdominal pain: Secondary | ICD-10-CM | POA: Diagnosis present

## 2016-11-03 LAB — CBC WITH DIFFERENTIAL/PLATELET
BASOS ABS: 0 10*3/uL (ref 0.0–0.1)
BASOS PCT: 0 %
Eosinophils Absolute: 0 10*3/uL (ref 0.0–0.7)
Eosinophils Relative: 0 %
HEMATOCRIT: 29.6 % — AB (ref 39.0–52.0)
HEMOGLOBIN: 9.8 g/dL — AB (ref 13.0–17.0)
LYMPHS PCT: 4 %
Lymphs Abs: 0.7 10*3/uL (ref 0.7–4.0)
MCH: 26.2 pg (ref 26.0–34.0)
MCHC: 33.1 g/dL (ref 30.0–36.0)
MCV: 79.1 fL (ref 78.0–100.0)
Monocytes Absolute: 0.2 10*3/uL (ref 0.1–1.0)
Monocytes Relative: 1 %
NEUTROS ABS: 14.3 10*3/uL — AB (ref 1.7–7.7)
NEUTROS PCT: 95 %
Platelets: 353 10*3/uL (ref 150–400)
RBC: 3.74 MIL/uL — AB (ref 4.22–5.81)
RDW: 14.2 % (ref 11.5–15.5)
WBC: 15.2 10*3/uL — AB (ref 4.0–10.5)

## 2016-11-03 LAB — FERRITIN: FERRITIN: 22 ng/mL — AB (ref 24–336)

## 2016-11-03 LAB — HEMOGLOBIN AND HEMATOCRIT, BLOOD
HCT: 26 % — ABNORMAL LOW (ref 39.0–52.0)
HEMATOCRIT: 26.9 % — AB (ref 39.0–52.0)
HEMATOCRIT: 25.2 % — AB (ref 39.0–52.0)
HEMOGLOBIN: 8.9 g/dL — AB (ref 13.0–17.0)
HEMOGLOBIN: 8.5 g/dL — AB (ref 13.0–17.0)
Hemoglobin: 8.6 g/dL — ABNORMAL LOW (ref 13.0–17.0)

## 2016-11-03 LAB — COMPREHENSIVE METABOLIC PANEL
ALBUMIN: 3.8 g/dL (ref 3.5–5.0)
ALT: 9 U/L — AB (ref 17–63)
AST: 18 U/L (ref 15–41)
Alkaline Phosphatase: 62 U/L (ref 38–126)
Anion gap: 12 (ref 5–15)
BILIRUBIN TOTAL: 0.5 mg/dL (ref 0.3–1.2)
BUN: 82 mg/dL — AB (ref 6–20)
CO2: 26 mmol/L (ref 22–32)
CREATININE: 2.29 mg/dL — AB (ref 0.61–1.24)
Calcium: 10.1 mg/dL (ref 8.9–10.3)
Chloride: 105 mmol/L (ref 101–111)
GFR calc Af Amer: 29 mL/min — ABNORMAL LOW (ref >60)
GFR, EST NON AFRICAN AMERICAN: 25 mL/min — AB (ref >60)
GLUCOSE: 164 mg/dL — AB (ref 65–99)
POTASSIUM: 3.6 mmol/L (ref 3.5–5.1)
Sodium: 143 mmol/L (ref 135–145)
TOTAL PROTEIN: 6.6 g/dL (ref 6.5–8.1)

## 2016-11-03 LAB — TYPE AND SCREEN
ABO/RH(D): O POS
ANTIBODY SCREEN: NEGATIVE

## 2016-11-03 LAB — I-STAT TROPONIN, ED: TROPONIN I, POC: 0 ng/mL (ref 0.00–0.08)

## 2016-11-03 LAB — RETICULOCYTES
RBC.: 3.44 MIL/uL — ABNORMAL LOW (ref 4.22–5.81)
RETIC COUNT ABSOLUTE: 41.3 10*3/uL (ref 19.0–186.0)
Retic Ct Pct: 1.2 % (ref 0.4–3.1)

## 2016-11-03 LAB — CBG MONITORING, ED: Glucose-Capillary: 133 mg/dL — ABNORMAL HIGH (ref 65–99)

## 2016-11-03 LAB — GLUCOSE, CAPILLARY
GLUCOSE-CAPILLARY: 118 mg/dL — AB (ref 65–99)
Glucose-Capillary: 123 mg/dL — ABNORMAL HIGH (ref 65–99)
Glucose-Capillary: 125 mg/dL — ABNORMAL HIGH (ref 65–99)

## 2016-11-03 LAB — VITAMIN B12: Vitamin B-12: 219 pg/mL (ref 180–914)

## 2016-11-03 LAB — IRON AND TIBC
Iron: 53 ug/dL (ref 45–182)
Saturation Ratios: 19 % (ref 17.9–39.5)
TIBC: 281 ug/dL (ref 250–450)
UIBC: 228 ug/dL

## 2016-11-03 LAB — FOLATE: Folate: 11.1 ng/mL (ref >5.9)

## 2016-11-03 LAB — POC OCCULT BLOOD, ED: Fecal Occult Bld: POSITIVE — AB

## 2016-11-03 MED ORDER — DOXAZOSIN MESYLATE 1 MG PO TABS
1.0000 mg | ORAL_TABLET | Freq: Every day | ORAL | Status: DC
  Administered 2016-11-03 – 2016-11-05 (×3): 1 mg via ORAL
  Filled 2016-11-03 (×3): qty 1

## 2016-11-03 MED ORDER — INSULIN ASPART 100 UNIT/ML ~~LOC~~ SOLN: 0.0000 [IU] | Freq: Three times a day (TID) | SUBCUTANEOUS | Status: DC

## 2016-11-03 MED ORDER — INSULIN ASPART 100 UNIT/ML ~~LOC~~ SOLN
0.0000 [IU] | SUBCUTANEOUS | Status: DC
  Administered 2016-11-03 – 2016-11-04 (×4): 2 [IU] via SUBCUTANEOUS
  Administered 2016-11-04 (×2): 3 [IU] via SUBCUTANEOUS
  Filled 2016-11-03: qty 1

## 2016-11-03 MED ORDER — SODIUM CHLORIDE 0.9 % IV SOLN
INTRAVENOUS | Status: DC
  Administered 2016-11-03 – 2016-11-05 (×4): via INTRAVENOUS

## 2016-11-03 MED ORDER — SODIUM CHLORIDE 0.9 % IV SOLN
8.0000 mg/h | INTRAVENOUS | Status: DC
  Administered 2016-11-03 (×2): 8 mg/h via INTRAVENOUS
  Filled 2016-11-03 (×5): qty 80

## 2016-11-03 MED ORDER — PANTOPRAZOLE SODIUM 40 MG IV SOLR
80.0000 mg | Freq: Once | INTRAVENOUS | Status: AC
  Administered 2016-11-03: 80 mg via INTRAVENOUS
  Filled 2016-11-03: qty 80

## 2016-11-03 MED ORDER — ATORVASTATIN CALCIUM 20 MG PO TABS
20.0000 mg | ORAL_TABLET | Freq: Every day | ORAL | Status: DC
  Administered 2016-11-03 – 2016-11-04 (×2): 20 mg via ORAL
  Filled 2016-11-03 (×2): qty 1

## 2016-11-03 MED ORDER — SODIUM CHLORIDE 0.9 % IV SOLN: INTRAVENOUS | Status: DC

## 2016-11-03 MED ORDER — SODIUM CHLORIDE 0.9 % IV BOLUS (SEPSIS)
1000.0000 mL | Freq: Once | INTRAVENOUS | Status: AC
  Administered 2016-11-03: 1000 mL via INTRAVENOUS

## 2016-11-03 MED ORDER — PANTOPRAZOLE SODIUM 40 MG IV SOLR: 40.0000 mg | Freq: Two times a day (BID) | INTRAVENOUS | Status: DC

## 2016-11-03 MED ORDER — ONDANSETRON HCL 4 MG PO TABS
4.0000 mg | ORAL_TABLET | Freq: Four times a day (QID) | ORAL | Status: DC | PRN
Start: 2016-11-03 — End: 2016-11-05

## 2016-11-03 MED ORDER — AMLODIPINE-ATORVASTATIN 10-20 MG PO TABS: 1.0000 | ORAL_TABLET | Freq: Every day | ORAL | Status: DC

## 2016-11-03 MED ORDER — AMLODIPINE BESYLATE 10 MG PO TABS
10.0000 mg | ORAL_TABLET | Freq: Every day | ORAL | Status: DC
  Administered 2016-11-03 – 2016-11-05 (×3): 10 mg via ORAL
  Filled 2016-11-03 (×3): qty 1

## 2016-11-03 MED ORDER — ACETAMINOPHEN 650 MG RE SUPP: 650.0000 mg | Freq: Four times a day (QID) | RECTAL | Status: DC | PRN

## 2016-11-03 MED ORDER — HYDRALAZINE HCL 20 MG/ML IJ SOLN: 10.0000 mg | Freq: Three times a day (TID) | INTRAMUSCULAR | Status: DC | PRN

## 2016-11-03 MED ORDER — ONDANSETRON HCL 4 MG/2ML IJ SOLN: 4.0000 mg | Freq: Four times a day (QID) | INTRAMUSCULAR | Status: DC | PRN

## 2016-11-03 MED ORDER — ACETAMINOPHEN 325 MG PO TABS: 650.0000 mg | ORAL_TABLET | Freq: Four times a day (QID) | ORAL | Status: DC | PRN

## 2016-11-03 MED ORDER — METOPROLOL SUCCINATE ER 25 MG PO TB24
25.0000 mg | ORAL_TABLET | Freq: Every day | ORAL | Status: DC
  Administered 2016-11-03 – 2016-11-05 (×3): 25 mg via ORAL
  Filled 2016-11-03 (×3): qty 1

## 2016-11-03 NOTE — ED Triage Notes (Signed)
Woke up last night with bad "acid reflux". Describes pain as a bad, but dull pain. Pt. Also complains of stool being almost black. Pt. Took Plavix last night. Is prescribed aspirin, but did not take it for the last 2 days. Pt. Has history of duodenal ulcers. Pt. Also complains of weakness. Weakness started this morning around 3:30 am. Pt woke up to use the restroom at 3:30 and discovered black stool.

## 2016-11-03 NOTE — ED Notes (Signed)
Family at bedside. 

## 2016-11-03 NOTE — H&P (Signed)
[facility] History and Physical  Rio Kidane QIO:[id number] DOB: May 02, 1935 DOA: 11/03/2016  Referring physician:  PCP: Nyoka Cowden, MD   Chief Complaint: "I went to the bathroom and this black stuff came out."  HPI: Austin [name] is a 82 y.o. male  with past medical history of anemia, carotid artery stenosis, diabetes, GERD, stomach ulcers, hyper lipidemia, hypertension, prostate cancer history, chronic renal disease presents emergency room with a lock stools. Though patient is on Plavix and aspirin is doing antiplatelet therapy chronically. Patient states he's only been on the Plavix alone for the last 2 days as he stopped his aspirin.  ED course: Patient with hemoglobin dropped from about ~12-->10. Guaiac positive. Currently asymmetric from his anemia. GI was consult did by the EDP. EDP recommended admission by hospitalist team. Hospitalists consulted.   Review of Systems:  As per HPI otherwise 10 point review of systems negative.    Past Medical History:  Diagnosis Date  . 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 CAROTID ARTERY STENOSIS 05/10/2010  . DIABETES MELLITUS, TYPE II 09/19/2007  . DISEASE, CEREBROVASCULAR NEC 03/05/2007  . GERD 03/13/2007  . HYPERLIPIDEMIA 03/05/2007  . HYPERTENSION 03/05/2007  . HYPOKALEMIA 11/09/2009  . KNEE PAIN, RIGHT 11/09/2009  . PROSTATE CANCER, HX OF 03/05/2007  . RENAL DISEASE, CHRONIC 02/03/2009   Past Surgical History:  Procedure Laterality Date  . APPENDECTOMY  02-20-12  . CAROTID ARTERY ANGIOPLASTY Right Oct. 10, 2001  . LAPAROSCOPIC APPENDECTOMY  02/20/2012   Procedure: APPENDECTOMY LAPAROSCOPIC;  Surgeon: Stark Klein, MD;  Location: [facility];  Service: General;  Laterality: N/A;  . PROSTATE SURGERY     prostatectomy   Social History:  reports that he quit smoking about 42 years ago. His smoking use included Cigarettes. He has never used smokeless tobacco. He reports that he does not drink alcohol or use drugs.  Allergies    Allergen Reactions  . Aspirin Other (See Comments)    High doses causes stomach ulcer and bleeding    Family History  Problem Relation Age of Onset  . Hypertension Mother   . Cancer Father   . Cancer Brother      Prior to Admission medications   Medication Sig Start Date End Date Taking? Authorizing Provider  amlodipine-atorvastatin (CADUET) 10-20 MG tablet TAKE 1 TABLET BY MOUTH EVERY DAY 07/05/16  Yes Marletta Lor, MD  clopidogrel (PLAVIX) 75 MG tablet TAKE 1 TABLET (75 MG TOTAL) BY MOUTH DAILY. 08/05/16  Yes Marletta Lor, MD  doxazosin (CARDURA) 1 MG tablet Take 1 mg by mouth daily.   Yes Historical Provider, MD  esomeprazole (NEXIUM) 40 MG capsule TAKE 1 CAPSULE BY MOUTH ONCE DAILY 03/21/16  Yes Marletta Lor, MD  glipiZIDE (GLUCOTROL XL) 5 MG 24 hr tablet TAKE 1 TABLET BY MOUTH EVERY DAY 12/18/15  Yes Marletta Lor, MD  hydrocortisone-pramoxine [facility]) 2.5-1 % rectal cream USE ONCE DAILY OR AS DIRECTED 05/06/16  Yes Marletta Lor, MD  KLOR-CON M20 20 MEQ tablet TAKE 1 TABLET BY MOUTH EVERY DAY 08/29/16  Yes Marletta Lor, MD  losartan-hydrochlorothiazide (HYZAAR) 100-25 MG tablet Take 1 tablet by mouth daily.  08/09/15  Yes Historical Provider, MD  metoprolol succinate (TOPROL-XL) 25 MG 24 hr tablet Take 2 tablets (50 mg total) by mouth daily. Patient taking differently: Take 25 mg by mouth daily.  09/09/16  Yes Marletta Lor, MD  [facility] DELICA LANCETS 32G MISC USE TO CHECK BLOOD SUGAR DAILY AND AS NEEDED  Patient not taking: Reported on 11/03/2016 11/09/15   Marletta Lor, MD  [facility] VERIO test strip USE TO CHECK BLOOD SUGAR DAILY AND AS NEEDED Patient not taking: Reported on 11/03/2016 04/04/16   Marletta Lor, MD   Physical Exam: Vitals:   11/03/16 0945 11/03/16 1000 11/03/16 1045 11/03/16 1100  BP: 153/70 152/73 165/72 170/71  Pulse: 82 85 84 88  Resp: 13 17 15 22   Temp:      TempSrc:      SpO2: 95% 96% 97% 98%     Wt Readings from Last 3 Encounters:  09/09/16 85.6 kg (188 lb 12.8 oz)  06/13/16 84.4 kg (186 lb)  05/02/16 84.6 kg (186 lb 8 oz)    General:  Appears calm and comfortable, Alert and oriented 3 Eyes:  PERRL, EOMI, normal lids, iris ENT:  grossly normal hearing, lips & tongue Neck:  no LAD, masses or thyromegaly Cardiovascular:  RRR, no m/r/g. No LE edema.  Respiratory:  CTA bilaterally, no w/r/r. Normal respiratory effort. Abdomen: soft, ntnd; deferred rectal exam Skin:  no rash or induration seen on limited exam Musculoskeletal:  grossly normal tone BUE/BLE Psychiatric:  grossly normal mood and affect, speech fluent and appropriate Neurologic:  CN 2-12 grossly intact, moves all extremities in coordinated fashion.          Labs on Admission:  Basic Metabolic Panel:  Recent Labs Lab 11/03/16 0915  NA 143  K 3.6  CL 105  CO2 26  GLUCOSE 164*  BUN 82*  CREATININE 2.29*  CALCIUM 10.1   Liver Function Tests:  Recent Labs Lab 11/03/16 0915  AST 18  ALT 9*  ALKPHOS 62  BILITOT 0.5  PROT 6.6  ALBUMIN 3.8   No results for input(s): LIPASE, AMYLASE in the last 168 hours. No results for input(s): AMMONIA in the last 168 hours. CBC:  Recent Labs Lab 11/03/16 0915  WBC 15.2*  NEUTROABS 14.3*  HGB 9.8*  HCT 29.6*  MCV 79.1  PLT 353   Cardiac Enzymes: No results for input(s): CKTOTAL, CKMB, CKMBINDEX, TROPONINI in the last 168 hours.  BNP (last 3 results) No results for input(s): BNP in the last 8760 hours.  ProBNP (last 3 results) No results for input(s): PROBNP in the last 8760 hours.   Creatinine clearance cannot be calculated (Unknown ideal weight.)  CBG: No results for input(s): GLUCAP in the last 168 hours.  Radiological Exams on Admission: Dg Abd Acute W/chest  Result Date: 11/03/2016 CLINICAL DATA:  Mid to lower abdominal pain today, GI bleeding, question free air EXAM: DG ABDOMEN ACUTE W/ 1V CHEST COMPARISON:  06/01/2016 FINDINGS: Normal  heart size, mediastinal contours, and pulmonary vascularity. Questionable poorly defined RIGHT upper lobe nodular density versus focus of infiltrate or summation artifact. Lungs otherwise clear. No pleural effusion or pneumothorax. Atherosclerotic calcifications aorta. Normal bowel gas pattern. No bowel dilatation, bowel wall thickening, or free air. Diffuse osseous demineralization. Surgical clips in pelvis bilaterally, by history likely related to prostate surgery. No urine tract calcification. IMPRESSION: Normal bowel gas pattern without evidence of free air or bowel obstruction. Aortic atherosclerosis. Date RIGHT upper lobe nodular density, could represent a focus of infiltrate or a summation artifact though a faint nodular density is not excluded. Either radiographic followup until resolution or CT recommended to exclude pulmonary tumor. Electronically Signed   By: Lavonia Dana M.D.   On: 11/03/2016 10:32    EKG: Independently reviewed. Normal sinus rhythm, no acute ST changes.  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 Discussed risk and benefits of holding Plavix with patient, he agrees to hold it.  HTN Toprol-XL qd, Caduet qd Hold Hyzaar  DM Q4 SSI Hold glipizide  BPH Cont cardura  CKD III Monitor Cr daily Cr at baseline,  2.2 Avoid nephrotoxic drugs  Abnormal CXR CT chest ordered to eval for possible mass  Code Status: FULL  DVT  Prophylaxis: SCD Family Communication: Wife at bedside Disposition Plan: Pending Improvement  Status: med-surg  Elwin Mocha, MD Family Medicine [facility] [email or web address] Password [email or web address]

## 2016-11-03 NOTE — Consult Note (Addendum)
Referring Provider:  Domenic Moras PAC/ER Primary Care Physician:  Nyoka Cowden, MD Primary Gastroenterologist:  Althia Forts. Used  to follow Dr. Sharlett Iles.   Reason for Consultation:  GI bleed  HPI: Austin [name] is a 82 y.o. male with past medical history of carotid artery stenosis currently on Plavix, history of acid reflux, history of prostate cancer came into the hospital with abdominal pain and black stool. GI is consulted for further evaluation.  Patient woke up this morning with weakness and epigastric abdominal pain. He had 2 episodes of black stool. Also complaining of worsening acid reflux. Denied dysphagia or odynophagia. Denied bright red blood per rectum. Denied chest pain. Denied NSAID use. Takes baby aspirin. Last dose of Plavix was yesterday night. No bowel movement since admission  Last EGD and colonoscopy in 2008 by Dr. Sharlett Iles.  Past Medical History:  Diagnosis Date  . 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 CAROTID ARTERY STENOSIS 05/10/2010  . DIABETES MELLITUS, TYPE II 09/19/2007  . DISEASE, CEREBROVASCULAR NEC 03/05/2007  . GERD 03/13/2007  . HYPERLIPIDEMIA 03/05/2007  . HYPERTENSION 03/05/2007  . HYPOKALEMIA 11/09/2009  . KNEE PAIN, RIGHT 11/09/2009  . PROSTATE CANCER, HX OF 03/05/2007  . RENAL DISEASE, CHRONIC 02/03/2009    Past Surgical History:  Procedure Laterality Date  . APPENDECTOMY  02-20-12  . CAROTID ARTERY ANGIOPLASTY Right Oct. 10, 2001  . LAPAROSCOPIC APPENDECTOMY  02/20/2012   Procedure: APPENDECTOMY LAPAROSCOPIC;  Surgeon: Stark Klein, MD;  Location: [facility];  Service: General;  Laterality: N/A;  . PROSTATE SURGERY     prostatectomy    Prior to Admission medications   Medication Sig Start Date End Date Taking? Authorizing Provider  amlodipine-atorvastatin (CADUET) 10-20 MG tablet TAKE 1 TABLET BY MOUTH EVERY DAY 07/05/16  Yes Marletta Lor, MD  clopidogrel (PLAVIX) 75 MG tablet TAKE 1 TABLET (75 MG TOTAL) BY MOUTH DAILY. 08/05/16  Yes  Marletta Lor, MD  doxazosin (CARDURA) 1 MG tablet Take 1 mg by mouth daily.   Yes Historical Provider, MD  esomeprazole (NEXIUM) 40 MG capsule TAKE 1 CAPSULE BY MOUTH ONCE DAILY 03/21/16  Yes Marletta Lor, MD  glipiZIDE (GLUCOTROL XL) 5 MG 24 hr tablet TAKE 1 TABLET BY MOUTH EVERY DAY 12/18/15  Yes Marletta Lor, MD  hydrocortisone-pramoxine [facility]) 2.5-1 % rectal cream USE ONCE DAILY OR AS DIRECTED 05/06/16  Yes Marletta Lor, MD  KLOR-CON M20 20 MEQ tablet TAKE 1 TABLET BY MOUTH EVERY DAY 08/29/16  Yes Marletta Lor, MD  losartan-hydrochlorothiazide (HYZAAR) 100-25 MG tablet Take 1 tablet by mouth daily.  08/09/15  Yes Historical Provider, MD  metoprolol succinate (TOPROL-XL) 25 MG 24 hr tablet Take 2 tablets (50 mg total) by mouth daily. Patient taking differently: Take 25 mg by mouth daily.  09/09/16  Yes Marletta Lor, MD  [facility] LANCETS 78G MISC USE TO CHECK BLOOD SUGAR DAILY AND AS NEEDED Patient not taking: Reported on 11/03/2016 11/09/15   Marletta Lor, MD  [facility] VERIO test strip USE TO CHECK BLOOD SUGAR DAILY AND AS NEEDED Patient not taking: Reported on 11/03/2016 04/04/16   Marletta Lor, MD    Scheduled Meds: . [START ON 11/06/2016] pantoprazole  40 mg Intravenous Q12H   Continuous Infusions: . pantoprozole (PROTONIX) infusion 8 mg/hr (11/03/16 1122)   PRN Meds:.  Allergies as of 11/03/2016 - Review Complete 11/03/2016  Allergen Reaction Noted  . Aspirin Other (See Comments) 02/20/2012    Family History  Problem Relation  Age of Onset  . Hypertension Mother   . Cancer Father   . Cancer Brother     Social History   Social History  . Marital status: Married    Spouse name: N/A  . Number of children: N/A  . Years of education: N/A   Occupational History  . Not on file.   Social History Main Topics  . Smoking status: Former Smoker    Types: Cigarettes    Quit date: 08/22/1974  . Smokeless tobacco: Never  Used  . Alcohol use No  . Drug use: No  . Sexual activity: Not on file   Other Topics Concern  . Not on file   Social History Narrative  . No narrative on file    Review of Systems: All negative except as stated above in HPI. Review of Systems  Constitutional: Negative for chills and fever.  HENT: Negative for ear discharge and ear pain.   Eyes: Negative for blurred vision and double vision.  Respiratory: Negative for cough, hemoptysis and shortness of breath.   Cardiovascular: Negative for chest pain and palpitations.  Gastrointestinal: Positive for abdominal pain, heartburn, melena and nausea.  Genitourinary: Negative for dysuria and hematuria.  Musculoskeletal: Positive for joint pain. Negative for falls and neck pain.  Skin: Negative for rash.  Neurological: Positive for weakness. Negative for focal weakness and seizures.  Endo/Heme/Allergies: Bruises/bleeds easily.  Psychiatric/Behavioral: Negative for hallucinations and suicidal ideas.    Physical Exam: Vital signs: Vitals:   11/03/16 1045 11/03/16 1100  BP: 165/72 170/71  Pulse: 84 88  Resp: 15 22  Temp:        Physical Exam  Constitutional: He is oriented to person, place, and time and well-developed, well-nourished, and in no distress. No distress.  HENT:  Head: Normocephalic and atraumatic.  Mouth/Throat: Oropharynx is clear and moist.  Eyes: EOM are normal. Right eye exhibits no discharge. Left eye exhibits no discharge. No scleral icterus.  Neck: Normal range of motion. Neck supple.  Cardiovascular: Normal rate, regular rhythm and normal heart sounds.   Pulmonary/Chest: Effort normal and breath sounds normal.  Abdominal: Soft. Bowel sounds are normal. He exhibits no distension and no mass. There is tenderness. There is no rebound and no guarding.  Epigastric Tenderness  Musculoskeletal: Normal range of motion.  Neurological: He is alert and oriented to person, place, and time.  Skin: Skin is dry.   Psychiatric: Mood, affect and judgment normal.    GI:  Lab Results:  Recent Labs  11/03/16 0915  WBC 15.2*  HGB 9.8*  HCT 29.6*  PLT 353   BMET  Recent Labs  11/03/16 0915  NA 143  K 3.6  CL 105  CO2 26  GLUCOSE 164*  BUN 82*  CREATININE 2.29*  CALCIUM 10.1   LFT  Recent Labs  11/03/16 0915  PROT 6.6  ALBUMIN 3.8  AST 18  ALT 9*  ALKPHOS 62  BILITOT 0.5   PT/INR No results for input(s): LABPROT, INR in the last 72 hours.   Studies/Results: Dg Abd Acute W/chest  Result Date: 11/03/2016 CLINICAL DATA:  Mid to lower abdominal pain today, GI bleeding, question free air EXAM: DG ABDOMEN ACUTE W/ 1V CHEST COMPARISON:  06/01/2016 FINDINGS: Normal heart size, mediastinal contours, and pulmonary vascularity. Questionable poorly defined RIGHT upper lobe nodular density versus focus of infiltrate or summation artifact. Lungs otherwise clear. No pleural effusion or pneumothorax. Atherosclerotic calcifications aorta. Normal bowel gas pattern. No bowel dilatation, bowel wall thickening, or free  air. Diffuse osseous demineralization. Surgical clips in pelvis bilaterally, by history likely related to prostate surgery. No urine tract calcification. IMPRESSION: Normal bowel gas pattern without evidence of free air or bowel obstruction. Aortic atherosclerosis. Date RIGHT upper lobe nodular density, could represent a focus of infiltrate or a summation artifact though a faint nodular density is not excluded. Either radiographic followup until resolution or CT recommended to exclude pulmonary tumor. Electronically Signed   By: Lavonia Dana M.D.   On: 11/03/2016 10:32    Impression/Plan: - Melena. Need to rule out ulcer disease.No bowel movement since admission. - Epigastric abdominal pain. Improving - Acute drop in hemoglobin - Occult blood positive stool. Most likely from upper GI bleed. Last colonoscopy in 2008 was normal. - Chronic kidney disease - Coronary artery disease.  Currently on Plavix. Last dose yesterday.  Recommendations ----------------------- - Continue IV PPI for now. Monitor H&H. - EGD Tomorrow for further evaluation .  Risk benefits and alternatives discussed with the patient. Verbalized understanding.( do  not have availability of anesthesia staff to perform endoscopy today) - Hold Plavix if okay with primary team - Clear liquid diet today. Nothing by mouth past midnight. - GI will follow.   LOS: 0 days   Otis Brace  MD, FACP 11/03/2016, 11:37 AM  Pager [phone] If no answer or after 5 PM call [phone]

## 2016-11-03 NOTE — ED Notes (Signed)
Pt.has return by from ct  In room

## 2016-11-03 NOTE — ED Provider Notes (Signed)
[facility] DEPT Provider Note   CSN: [id number] Arrival date & time: 11/03/16  0813     History   Chief Complaint Chief Complaint  Patient presents with  . Abdominal Pain  . Weakness  . Melena    HPI Austin [name] is a 82 y.o. male.  HPI   82 year old male with hx of GERD, anemia due to chronic disease, DM, renal disease, CVA currently on Plavix, hx duodenal ulcers, prostate cancer presenting with abd pain.  Patient reports for the past 3-4 days he has been feeling more fatigued in usual. Yesterday he developed pain to his upper abdomen which he described as initial sharp pain followed by dull pain, rated as 6 out of 10 that has since resolved. He did notice that his burp has an acidic taste to it. This morning he noticed dark black stool which is unusual for him. Patient denies having associated fever, chest pain, shortness of breath, back pain, dysuria, hematuria, or rash. He denies any recent injury. He reports prior history of duodenal ulcer 25 years ago but it has not been flaring up anytime recently. He was recommended to take baby ASA which he took for a few days but stop 2 days ago.  He reports having normal appetite. Did notice that his blood sugar was elevated 2 days ago but it has normalized. Denies any abnormal weight changes, night sweats, or fever. Denies any history of drug, alcohol, smoking. He has a normal cardiac stress test 6 months ago. Last colonoscopy was within the past 10 years and was normal no polyps. Prostate surgery many years ago. Currently on Plavix due to having blood clot in right eye in 2001. Does have history of external hemorrhoid but states he didn't have any constipation and denies any rectal pain or rectal itchiness. No other abnormal bleeding.   Past Medical History:  Diagnosis Date  . 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 CAROTID ARTERY STENOSIS 05/10/2010  . DIABETES MELLITUS, TYPE II 09/19/2007  . DISEASE, CEREBROVASCULAR NEC 03/05/2007    . GERD 03/13/2007  . HYPERLIPIDEMIA 03/05/2007  . HYPERTENSION 03/05/2007  . HYPOKALEMIA 11/09/2009  . KNEE PAIN, RIGHT 11/09/2009  . PROSTATE CANCER, HX OF 03/05/2007  . RENAL DISEASE, CHRONIC 02/03/2009    Patient Active Problem List   Diagnosis Date Noted  . Carotid artery stenosis 05/10/2010  . Disorder of kidney and ureter 02/03/2009  . Diabetes mellitus with renal complications ([place]) 40/98/1191  . Iron deficiency anemia due to chronic blood loss 03/13/2007  . GERD 03/13/2007  . Dyslipidemia 03/05/2007  . Essential hypertension 03/05/2007  . DISEASE, CEREBROVASCULAR NEC 03/05/2007  . PROSTATE CANCER, HX OF 03/05/2007    Past Surgical History:  Procedure Laterality Date  . APPENDECTOMY  02-20-12  . CAROTID ARTERY ANGIOPLASTY Right Oct. 10, 2001  . LAPAROSCOPIC APPENDECTOMY  02/20/2012   Procedure: APPENDECTOMY LAPAROSCOPIC;  Surgeon: Stark Klein, MD;  Location: [facility];  Service: General;  Laterality: N/A;  . PROSTATE SURGERY     prostatectomy       Home Medications    Prior to Admission medications   Medication Sig Start Date End Date Taking? Authorizing Provider  amlodipine-atorvastatin (CADUET) 10-20 MG tablet TAKE 1 TABLET BY MOUTH EVERY DAY 07/05/16   Marletta Lor, MD  aspirin 81 MG tablet Take 81 mg by mouth daily.      Historical Provider, MD  clopidogrel (PLAVIX) 75 MG tablet TAKE 1 TABLET (75 MG TOTAL) BY MOUTH DAILY. 08/05/16  Marletta Lor, MD  esomeprazole (NEXIUM) 40 MG capsule TAKE 1 CAPSULE BY MOUTH ONCE DAILY 03/21/16   Marletta Lor, MD  glipiZIDE (GLUCOTROL XL) 5 MG 24 hr tablet TAKE 1 TABLET BY MOUTH EVERY DAY 12/18/15   Marletta Lor, MD  hydrocortisone-pramoxine [facility]) 2.5-1 % rectal cream USE ONCE DAILY OR AS DIRECTED 05/06/16   Marletta Lor, MD  KLOR-CON M20 20 MEQ tablet TAKE 1 TABLET BY MOUTH EVERY DAY 08/29/16   Marletta Lor, MD  losartan-hydrochlorothiazide [facility]) 100-25 MG tablet  08/09/15   Historical  Provider, MD  metoprolol succinate (TOPROL-XL) 25 MG 24 hr tablet Take 2 tablets (50 mg total) by mouth daily. 09/09/16   Marletta Lor, MD  [facility] DELICA LANCETS 76B MISC USE TO CHECK BLOOD SUGAR DAILY AND AS NEEDED 11/09/15   Marletta Lor, MD  [facility] VERIO test strip USE TO CHECK BLOOD SUGAR DAILY AND AS NEEDED 04/04/16   Marletta Lor, MD    Family History Family History  Problem Relation Age of Onset  . Hypertension Mother   . Cancer Father   . Cancer Brother     Social History Social History  Substance Use Topics  . Smoking status: Former Smoker    Types: Cigarettes    Quit date: 08/22/1974  . Smokeless tobacco: Never Used  . Alcohol use No     Allergies   Aspirin   Review of Systems Review of Systems  All other systems reviewed and are negative.    Physical Exam Updated Vital Signs BP 160/73 (BP Location: Left Arm)   Pulse 86   Temp 99 F (37.2 C) (Oral)   Resp 23   SpO2 100%   Physical Exam  Constitutional: He is oriented to person, place, and time. He appears well-developed and well-nourished. No distress.  Patient resting in bed in no acute discomfort, nontoxic in appearance  HENT:  Head: Atraumatic.  Eyes: Conjunctivae are normal.  Neck: Neck supple.  Cardiovascular: Normal rate and regular rhythm.   Murmur (Faint 2/6 systolic murmur best heard in the left intercostal space) heard. Pulmonary/Chest: Effort normal and breath sounds normal.  Abdominal: Soft. Bowel sounds are normal. He exhibits no distension and no mass. There is no tenderness. There is no guarding.  Genitourinary:  Genitourinary Comments: Chaperone present during exam. Nonthrombosed external hemorrhoid. No anal fissure. Normal rectal tone. No bleeding hemorrhoid. Melanotic stool noted on glove. No obvious rectal mass.  Neurological: He is alert and oriented to person, place, and time.  Skin: No rash noted.  Psychiatric: He has a normal mood and affect.  Nursing  note and vitals reviewed.    ED Treatments / Results  Labs (all labs ordered are listed, but only abnormal results are displayed) Labs Reviewed  CBC WITH DIFFERENTIAL/PLATELET - Abnormal; Notable for the following:       Result Value   WBC 15.2 (*)    RBC 3.74 (*)    Hemoglobin 9.8 (*)    HCT 29.6 (*)    Neutro Abs 14.3 (*)    All other components within normal limits  COMPREHENSIVE METABOLIC PANEL - Abnormal; Notable for the following:    Glucose, Bld 164 (*)    BUN 82 (*)    Creatinine, Ser 2.29 (*)    ALT 9 (*)    GFR calc non Af Amer 25 (*)    GFR calc Af Amer 29 (*)    All other components within normal limits  POC  OCCULT BLOOD, ED - Abnormal; Notable for the following:    Fecal Occult Bld POSITIVE (*)    All other components within normal limits  I-STAT TROPOININ, ED  TYPE AND SCREEN    EKG  EKG Interpretation None     ED ECG REPORT   Date: 11/03/2016  Rate: 91  Rhythm: normal sinus rhythm  QRS Axis: normal  Intervals: QT prolonged  ST/T Wave abnormalities: early repolarization  Conduction Disutrbances:nonspecific intraventricular conduction delay  Narrative Interpretation:   Old EKG Reviewed: unchanged  I have personally reviewed the EKG tracing and agree with the computerized printout as noted.   Radiology Dg Abd Acute W/chest  Result Date: 11/03/2016 CLINICAL DATA:  Mid to lower abdominal pain today, GI bleeding, question free air EXAM: DG ABDOMEN ACUTE W/ 1V CHEST COMPARISON:  06/01/2016 FINDINGS: Normal heart size, mediastinal contours, and pulmonary vascularity. Questionable poorly defined RIGHT upper lobe nodular density versus focus of infiltrate or summation artifact. Lungs otherwise clear. No pleural effusion or pneumothorax. Atherosclerotic calcifications aorta. Normal bowel gas pattern. No bowel dilatation, bowel wall thickening, or free air. Diffuse osseous demineralization. Surgical clips in pelvis bilaterally, by history likely related to  prostate surgery. No urine tract calcification. IMPRESSION: Normal bowel gas pattern without evidence of free air or bowel obstruction. Aortic atherosclerosis. Date RIGHT upper lobe nodular density, could represent a focus of infiltrate or a summation artifact though a faint nodular density is not excluded. Either radiographic followup until resolution or CT recommended to exclude pulmonary tumor. Electronically Signed   By: Lavonia Dana M.D.   On: 11/03/2016 10:32    Procedures Procedures (including critical care time)  Medications Ordered in ED Medications  pantoprazole (PROTONIX) 80 mg in sodium chloride 0.9 % 100 mL IVPB (80 mg Intravenous New Bag/Given 11/03/16 1035)  pantoprazole (PROTONIX) 80 mg in sodium chloride 0.9 % 250 mL (0.32 mg/mL) infusion (not administered)  pantoprazole (PROTONIX) injection 40 mg (not administered)  sodium chloride 0.9 % bolus 1,000 mL (1,000 mLs Intravenous New Bag/Given 11/03/16 1035)     Initial Impression / Assessment and Plan / ED Course  I have reviewed the triage vital signs and the nursing notes.  Pertinent labs & imaging results that were available during my care of the patient were reviewed by me and considered in my medical decision making (see chart for details).     BP 170/71   Pulse 88   Temp 99 F (37.2 C) (Oral)   Resp 22   SpO2 98%    Final Clinical Impressions(s) / ED Diagnoses   Final diagnoses:  Gastrointestinal hemorrhage with melena    New Prescriptions New Prescriptions   No medications on file   9:33 AM Elderly male with history of duodenal ulcer here with complaint of black stool and upper abd pain suggestive of upper GI bleed. Also report feeling fatigue for the past 3 days.  He is stable currently.  Hemoccult positive, Hbg 9.8, lower than his baseline of 12-13.  Elevated WBC of 15.2, BUN 82, Cr 2.29.  Finding also support GI bleed.  IVF given, acute abdominal series without evidence of free air to suggest perforation.   Protonix started.  Will consult GI and will have pt admitted for further care, which likely will include endoscopy/colonoscopy.  Care discussed with Dr. Venora Maples.  11:03 AM Appreciate consultation from GI specialist from [facility], who agrees to see pt in the ER and will likely perform endoscopy.  Pt made NPO.    11:45  AM Appreciate consultation from [facility] Dr. Aggie Moats who agrees to see and admit pt for further management.     Domenic Moras, PA-C 11/03/16 [place], PA-C 11/03/16 1303    Jola Schmidt, MD 11/04/16 [phone]

## 2016-11-03 NOTE — ED Notes (Signed)
Pt at xray

## 2016-11-03 NOTE — Progress Notes (Signed)
Pt admitted to the unit at 1547. Pt mental status is A&O x 4. Pt oriented to room, staff, and call bell. Skin is intact except where otherwise charted. Full assessment charted in [facility]. Call bell within reach. Visitor guidelines reviewed w/ pt and/or family.

## 2016-11-04 ENCOUNTER — Observation Stay (HOSPITAL_COMMUNITY): Payer: Medicare Other | Admitting: Certified Registered Nurse Anesthetist

## 2016-11-04 ENCOUNTER — Encounter (HOSPITAL_COMMUNITY)
Admission: EM | Disposition: A | Discharge status: Home / Self Care | Payer: Self-pay | Source: Home / Self Care | Attending: Emergency Medicine

## 2016-11-04 ENCOUNTER — Encounter (HOSPITAL_COMMUNITY): Payer: Self-pay | Admitting: Student

## 2016-11-04 DIAGNOSIS — E0821 Diabetes mellitus due to underlying condition with diabetic nephropathy: Secondary | ICD-10-CM | POA: Diagnosis not present

## 2016-11-04 DIAGNOSIS — K921 Melena: Secondary | ICD-10-CM

## 2016-11-04 DIAGNOSIS — I10 Essential (primary) hypertension: Secondary | ICD-10-CM | POA: Diagnosis not present

## 2016-11-04 DIAGNOSIS — K317 Polyp of stomach and duodenum: Secondary | ICD-10-CM | POA: Diagnosis not present

## 2016-11-04 DIAGNOSIS — K2950 Unspecified chronic gastritis without bleeding: Secondary | ICD-10-CM | POA: Diagnosis not present

## 2016-11-04 DIAGNOSIS — D62 Acute posthemorrhagic anemia: Secondary | ICD-10-CM | POA: Diagnosis not present

## 2016-11-04 HISTORY — PX: ESOPHAGOGASTRODUODENOSCOPY (EGD) WITH PROPOFOL: SHX5813

## 2016-11-04 LAB — CBC
HCT: 24.5 % — ABNORMAL LOW (ref 39.0–52.0)
Hemoglobin: 8.2 g/dL — ABNORMAL LOW (ref 13.0–17.0)
MCH: 26.2 pg (ref 26.0–34.0)
MCHC: 33.5 g/dL (ref 30.0–36.0)
MCV: 78.3 fL (ref 78.0–100.0)
PLATELETS: 285 10*3/uL (ref 150–400)
RBC: 3.13 MIL/uL — ABNORMAL LOW (ref 4.22–5.81)
RDW: 14.5 % (ref 11.5–15.5)
WBC: 11 10*3/uL — AB (ref 4.0–10.5)

## 2016-11-04 LAB — BASIC METABOLIC PANEL
Anion gap: 7 (ref 5–15)
BUN: 63 mg/dL — AB (ref 6–20)
CALCIUM: 9.1 mg/dL (ref 8.9–10.3)
CO2: 26 mmol/L (ref 22–32)
CREATININE: 1.9 mg/dL — AB (ref 0.61–1.24)
Chloride: 111 mmol/L (ref 101–111)
GFR calc Af Amer: 37 mL/min — ABNORMAL LOW (ref >60)
GFR calc non Af Amer: 32 mL/min — ABNORMAL LOW (ref >60)
Glucose, Bld: 119 mg/dL — ABNORMAL HIGH (ref 65–99)
Potassium: 2.7 mmol/L — CL (ref 3.5–5.1)
SODIUM: 144 mmol/L (ref 135–145)

## 2016-11-04 LAB — GLUCOSE, CAPILLARY
GLUCOSE-CAPILLARY: 118 mg/dL — AB (ref 65–99)
GLUCOSE-CAPILLARY: 123 mg/dL — AB (ref 65–99)
GLUCOSE-CAPILLARY: 59 mg/dL — AB (ref 65–99)
Glucose-Capillary: 108 mg/dL — ABNORMAL HIGH (ref 65–99)
Glucose-Capillary: 154 mg/dL — ABNORMAL HIGH (ref 65–99)
Glucose-Capillary: 167 mg/dL — ABNORMAL HIGH (ref 65–99)

## 2016-11-04 LAB — HEMOGLOBIN AND HEMATOCRIT, BLOOD
HCT: 24.4 % — ABNORMAL LOW (ref 39.0–52.0)
HEMOGLOBIN: 8.2 g/dL — AB (ref 13.0–17.0)

## 2016-11-04 LAB — POCT I-STAT 4, (NA,K, GLUC, HGB,HCT)
Glucose, Bld: 152 mg/dL — ABNORMAL HIGH (ref 65–99)
HCT: 25 % — ABNORMAL LOW (ref 39.0–52.0)
Hemoglobin: 8.5 g/dL — ABNORMAL LOW (ref 13.0–17.0)
Potassium: 3.2 mmol/L — ABNORMAL LOW (ref 3.5–5.1)
Sodium: 152 mmol/L — ABNORMAL HIGH (ref 135–145)

## 2016-11-04 LAB — PROTIME-INR
INR: 1.11
Prothrombin Time: 14.4 seconds (ref 11.4–15.2)

## 2016-11-04 SURGERY — ESOPHAGOGASTRODUODENOSCOPY (EGD) WITH PROPOFOL
Anesthesia: Monitor Anesthesia Care

## 2016-11-04 MED ORDER — BUTAMBEN-TETRACAINE-BENZOCAINE 2-2-14 % EX AERO
INHALATION_SPRAY | CUTANEOUS | Status: DC | PRN
  Administered 2016-11-04: 2 via TOPICAL

## 2016-11-04 MED ORDER — SODIUM CHLORIDE 0.9 % IV SOLN: INTRAVENOUS | Status: DC

## 2016-11-04 MED ORDER — PROPOFOL 500 MG/50ML IV EMUL
INTRAVENOUS | Status: DC | PRN
  Administered 2016-11-04: 75 ug/kg/min via INTRAVENOUS

## 2016-11-04 MED ORDER — SODIUM CHLORIDE 0.9 % IV SOLN
30.0000 meq | Freq: Once | INTRAVENOUS | Status: AC
  Administered 2016-11-04: 30 meq via INTRAVENOUS
  Filled 2016-11-04: qty 15

## 2016-11-04 MED ORDER — POTASSIUM CHLORIDE CRYS ER 20 MEQ PO TBCR
40.0000 meq | EXTENDED_RELEASE_TABLET | Freq: Once | ORAL | Status: AC
  Administered 2016-11-04: 40 meq via ORAL
  Filled 2016-11-04: qty 2

## 2016-11-04 SURGICAL SUPPLY — 15 items

## 2016-11-04 NOTE — Anesthesia Postprocedure Evaluation (Addendum)
Anesthesia Post Note  Patient: Austin [name]  Procedure(s) Performed: Procedure(s) (LRB): ESOPHAGOGASTRODUODENOSCOPY (EGD) WITH PROPOFOL (N/A)  Patient location during evaluation: Endoscopy Anesthesia Type: MAC Level of consciousness: awake Pain management: pain level controlled Vital Signs Assessment: post-procedure vital signs reviewed and stable Respiratory status: spontaneous breathing Cardiovascular status: stable Postop Assessment: no signs of nausea or vomiting Anesthetic complications: no        Last Vitals:  Vitals:   11/04/16 1140 11/04/16 1145  BP: (!) 159/63 (!) 163/63  Pulse: 67 68  Resp: 20 13  Temp:      Last Pain:  Vitals:   11/04/16 1135  TempSrc: Oral  PainSc:    Pain Goal:                 Othal Kubitz JR,JOHN Zykeria Laguardia

## 2016-11-04 NOTE — Progress Notes (Signed)
CRITICAL VALUE ALERT  Critical value received:  Potassium 2.7  Date of notification:  11/04/16  Time of notification:  0148  Critical value read back:Yes.    Nurse who received alert:  Lorenso Courier  MD notified (1st page):  Baltazar Najjar, NP  Time of first page:  0150  MD notified (2nd page):  Time of second page:  Responding MD:  Baltazar Najjar, NP  Time MD responded:  [phone]

## 2016-11-04 NOTE — Progress Notes (Signed)
Dr Alessandra Bevels notified that patient has only been off of Plavix for 2 days; insisted to continue with hot snare of gastric polyps

## 2016-11-04 NOTE — Op Note (Signed)
[facility] Patient Name: Austin [name] Procedure Date : 11/04/2016 MRN: [id number] Attending MD: Otis Brace , MD Date of Birth: Apr 01, 1935 CSN: [id number] Age: 82 Admit Type: Inpatient Procedure:                Upper GI endoscopy Indications:              Melena Providers:                Otis Brace, MD, Dortha Schwalbe RN, RN,                            Cherylynn Ridges, Technician, Kerrie Pleasure, CRNA Referring MD:              Medicines:                Sedation Administered by an Anesthesia Professional Complications:            No immediate complications. Estimated Blood Loss:     Estimated blood loss was minimal. Procedure:                Pre-Anesthesia Assessment:                           - Prior to the procedure, a History and Physical                            was performed, and patient medications and                            allergies were reviewed. The patient's tolerance of                            previous anesthesia was also reviewed. The risks                            and benefits of the procedure and the sedation                            options and risks were discussed with the patient.                            All questions were answered, and informed consent                            was obtained. Prior Anticoagulants: The patient has                            taken Plavix (clopidogrel), last dose was 2 days                            prior to procedure. ASA Grade Assessment: III - A                            patient with severe systemic disease. After  reviewing the risks and benefits, the patient was                            deemed in satisfactory condition to undergo the                            procedure.                           After obtaining informed consent, the endoscope was                            passed under direct vision. Throughout the                            procedure, the  patient's blood pressure, pulse, and                            oxygen saturations were monitored continuously. The                            EG-[id number] ([id number]) scope was introduced through the                            mouth, and advanced to the second part of duodenum.                            The upper GI endoscopy was accomplished without                            difficulty. The patient tolerated the procedure                            well. Scope In: Scope Out: Findings:      Normal mucosa was found in the entire esophagus.      One 15 mm semi-sessile polyp with stigmata of recent bleeding was found       in the gastric body. this polyp had a large blood clot on it. Although       last dose of Plavix was 2 days ago, I think this was a source of his       bleeding and decision was made to remove it. The polyp was removed with       a piecemeal technique using a hot snare. Polyp resection was incomplete.       The resected tissue was retrieved using a retrieval net. there was no       active bleeding at the end of polypectomy      One 8 mm sessile polyp with no bleeding and no stigmata of recent       bleeding was found in the gastric fundus. The polyp was removed with a       hot snare. Resection and retrieval were complete.      few small polyps were also seen scattered inside gastric cavity which       were not removed.      The duodenal bulb, first portion of the  duodenum and second portion of       the duodenum were normal. Impression:               - Normal mucosa was found in the entire esophagus.                           - One gastric polyp. Incomplete resection. Resected                            tissue retrieved.                           - One gastric polyp. Resected and retrieved.                           - Normal duodenal bulb, first portion of the                            duodenum and second portion of the duodenum. Moderate Sedation:      Moderate  (conscious) sedation was personally administered by an       anesthesia professional. The following parameters were monitored: oxygen       saturation, heart rate, blood pressure, and response to care. Recommendation:           - Return patient to hospital ward for ongoing care.                            hold Plavix for 5 days if okay with primary team                           - Full liquid diet.                           - Repeat upper endoscopy in 2 months for removal of                            other polyps as well as for follow up of                            incompletely removed inflammatory polyp in the                            bodyafter holding Plavix for 5 days                           - Return to GI office in 4 weeks.                           - Discontinue Plavix (clopidogrel) for 5 days. Procedure Code(s):        --- Professional ---                           [phone], Esophagogastroduodenoscopy, flexible,  transoral; with removal of tumor(s), polyp(s), or                            other lesion(s) by snare technique Diagnosis Code(s):        --- Professional ---                           K31.7, Polyp of stomach and duodenum                           K92.1, Melena (includes Hematochezia) CPT copyright 2016 [employer]. All rights reserved. The codes documented in this report are preliminary and upon coder review may  be revised to meet current compliance requirements. Otis Brace, MD Otis Brace, MD 11/04/2016 11:45:49 AM Number of Addenda: 0

## 2016-11-04 NOTE — Progress Notes (Addendum)
[facility] Gastroenterology Progress Note  Austin [name] 82 y.o. 1935/06/13  CC:  GI bleed.   Subjective: Patient has NO  further evidence of active bleeding. No bowel movement since admission. Hemoglobin relatively stable. Abdominal pain resolved. Schedule for EGD today.  ROS : Negative for active bleeding. Negative for abdominal pain, nausea and vomiting.   Objective: Vital signs in last 24 hours: Vitals:   11/04/16 0704 11/04/16 0936  BP: (!) 148/58 (!) 169/63  Pulse: 70 74  Resp: 18 12  Temp: 98.1 F (36.7 C) 98.3 F (36.8 C)    Physical Exam:  General:  Alert, cooperative, no distress, appears stated age  Head:  Normocephalic, without obvious abnormality, atraumatic  Eyes:  , EOM's intact,   Lungs:   Clear to auscultation bilaterally, respirations unlabored  Heart:  Regular rate and rhythm, S1, S2 normal  Abdomen:   Soft, non-tender, bowel sounds active all four quadrants,  no masses,   Extremities: Extremities normal, atraumatic, no  edema       Lab Results:  Recent Labs  11/03/16 0915 11/04/16 0041 11/04/16 0953  NA 143 144 152*  K 3.6 2.7* 3.2*  CL 105 111  --   CO2 26 26  --   GLUCOSE 164* 119* 152*  BUN 82* 63*  --   CREATININE 2.29* 1.90*  --   CALCIUM 10.1 9.1  --     Recent Labs  11/03/16 0915  AST 18  ALT 9*  ALKPHOS 62  BILITOT 0.5  PROT 6.6  ALBUMIN 3.8    Recent Labs  11/03/16 0915  11/04/16 0041 11/04/16 0953  WBC 15.2*  --  11.0*  --   NEUTROABS 14.3*  --   --   --   HGB 9.8*  < > 8.2*  8.2* 8.5*  HCT 29.6*  < > 24.5*  24.4* 25.0*  MCV 79.1  --  78.3  --   PLT 353  --  285  --   < > = values in this interval not displayed.  Recent Labs  11/04/16 0041  LABPROT 14.4  INR 1.11      Assessment/Plan: - Melena. Need to rule out ulcer disease.No bowel movement since admission. - Epigastric abdominal pain.resolved.  - Acute drop in hemoglobin. Hemoglobin stable now. - Occult blood positive stool. Most likely from upper GI  bleed. Last colonoscopy in 2008 was normal. - Chronic kidney disease - Coronary artery disease. Currently on Plavix. Last dose yesterday.  Recommendations ----------------------- - Continue IV PPI for now. Monitor H&H. - EGD today for further evaluation .  Risk benefits and alternatives discussed with the patient. Verbalized understanding. - Further plan based on endoscopic finding.   Otis Brace MD, FACP 11/04/2016, 10:43 AM  Pager [phone]  If no answer or after 5 PM call [phone]

## 2016-11-04 NOTE — Transfer of Care (Signed)
Immediate Anesthesia Transfer of Care Note  Patient: Austin [name]  Procedure(s) Performed: Procedure(s): ESOPHAGOGASTRODUODENOSCOPY (EGD) WITH PROPOFOL (N/A)  Patient Location: Endoscopy Unit  Anesthesia Type:MAC  Level of Consciousness: awake, alert , oriented and patient cooperative  Airway & Oxygen Therapy: Patient Spontanous Breathing and Patient connected to nasal cannula oxygen  Post-op Assessment: Report given to RN, Post -op Vital signs reviewed and stable and Patient moving all extremities X 4  Post vital signs: Reviewed and stable  Last Vitals:  Vitals:   11/04/16 0936 11/04/16 1135  BP: (!) 169/63 (!) 145/64  Pulse: 74 65  Resp: 12   Temp: 36.8 C     Last Pain:  Vitals:   11/04/16 1135  TempSrc: Oral  PainSc:          Complications: No apparent anesthesia complications

## 2016-11-04 NOTE — Anesthesia Procedure Notes (Signed)
Procedure Name: Intubation Date/Time: 11/04/2016 11:00 AM Performed by: Carney Living Pre-anesthesia Checklist: Patient identified, Emergency Drugs available, Suction available, Patient being monitored and Timeout performed Patient Re-evaluated:Patient Re-evaluated prior to inductionOxygen Delivery Method: Nasal cannula

## 2016-11-04 NOTE — Progress Notes (Signed)
Dr Alessandra Bevels notified that patient has only been off of Plavix for 2 days; insisted to continue with random gastric biopsies

## 2016-11-04 NOTE — Progress Notes (Signed)
Patient ID: Austin [name], male   DOB: 10/05/1934, 82 y.o.   MRN: [id number]    PROGRESS NOTE    Austin [name]  OEV:[id number] DOB: [birth year] DOA: 11/03/2016  PCP: Nyoka Cowden, MD   Brief Narrative:  82 y.o. male  with past medical history of anemia, carotid artery stenosis on plavix, diabetes, GERD, stomach ulcers, hyper lipidemia, hypertension, prostate cancer history, chronic renal disease, presented to emergency room with main concern of black stools. Pt reports he takes aspirin sporadically.   Assessment & Plan:  Acute blood loss anemia, Blood in stool - EGD with  - Normal mucosa was found in the entire esophagus. - One gastric polyp. Incomplete resection. Resected tissue retrieved. - One gastric polyp. Resected and retrieved. - Normal duodenal bulb, first portion of the duodenum and second portion of the duodenum.  - recommendation is to advance diet to full liquid, hold Plavix for 5 days - repeat Hg in Am and is stable, pt can go home  - Repeat upper endoscopy in 2 months for removal of other polyps as well as for follow up of incompletely removed inflammatory polyp in the body - Return to GI office in 4 weeks  Acute on chronic kidney disease, stage III  - in the setting of acute blood loss anemia - improving  - BMP in AM  Hypokalemia  - supplement and repeat BMP in AM  Hypernatremia - unclear why sodium is up - diet advanced - BMP in AM   DVT prophylaxis: SCD's Code Status: Full  Family Communication: Patient at bedside  Disposition Plan: Home in Am if Hg stable   Consultants:   GI  Procedures:   EGD  Antimicrobials:   None  Subjective: No events overnight.   Objective: Vitals:   11/04/16 1135 11/04/16 1140 11/04/16 1145 11/04/16 1400  BP: (!) 145/64 (!) 159/63 (!) 163/63 (!) 152/62  Pulse: 65 67 68 72  Resp: 12 20 13 19   Temp: 98.4 F (36.9 C)   97.5 F (36.4 C)  TempSrc: Oral   Oral  SpO2: 100% 100% 100% 100%  Weight:      Height:         Intake/Output Summary (Last 24 hours) at 11/04/16 1712 Last data filed at 11/04/16 1400  Gross per 24 hour  Intake          2568.75 ml  Output              250 ml  Net          2318.75 ml   Filed Weights   11/04/16 0500 11/04/16 0936  Weight: 81.4 kg (179 lb 8 oz) 81.2 kg (179 lb)    Examination:  General exam: Appears calm and comfortable  Respiratory system: Clear to auscultation. Respiratory effort normal. Cardiovascular system: S1 & S2 heard, RRR. No JVD, murmurs, rubs, gallops or clicks. No pedal edema. Gastrointestinal system: Abdomen is nondistended, soft and nontender. No organomegaly or masses felt.  Central nervous system: Alert and oriented. No focal neurological deficits. Extremities: Symmetric 5 x 5 power.   Data Reviewed: I have personally reviewed following labs and imaging studies  CBC:  Recent Labs Lab 11/03/16 0915 11/03/16 1443 11/03/16 1554 11/03/16 2023 11/04/16 0041 11/04/16 0953  WBC 15.2*  --   --   --  11.0*  --   NEUTROABS 14.3*  --   --   --   --   --   HGB 9.8* 8.9* 8.6* 8.5* 8.2*  8.2* 8.5*  HCT 29.6* 26.9* 26.0* 25.2* 24.5*  24.4* 25.0*  MCV 79.1  --   --   --  78.3  --   PLT 353  --   --   --  285  --    Basic Metabolic Panel:  Recent Labs Lab 11/03/16 0915 11/04/16 0041 11/04/16 0953  NA 143 144 152*  K 3.6 2.7* 3.2*  CL 105 111  --   CO2 26 26  --   GLUCOSE 164* 119* 152*  BUN 82* 63*  --   CREATININE 2.29* 1.90*  --   CALCIUM 10.1 9.1  --    Liver Function Tests:  Recent Labs Lab 11/03/16 0915  AST 18  ALT 9*  ALKPHOS 62  BILITOT 0.5  PROT 6.6  ALBUMIN 3.8   Coagulation Profile:  Recent Labs Lab 11/04/16 0041  INR 1.11   CBG:  Recent Labs Lab 11/03/16 2026 11/03/16 2349 11/04/16 0444 11/04/16 0757 11/04/16 1216  GLUCAP 125* 118* 123* 108* 118*   Anemia Panel:  Recent Labs  11/03/16 1443  VITAMINB12 219  FOLATE 11.1  FERRITIN 22*  TIBC 281  IRON 53  RETICCTPCT 1.2   Urine  analysis:    Component Value Date/Time   COLORURINE YELLOW 01/26/2013 2059   APPEARANCEUR CLOUDY (A) 01/26/2013 2059   LABSPEC 1.014 01/26/2013 2059   PHURINE 6.5 01/26/2013 2059   GLUCOSEU NEGATIVE 01/26/2013 2059   HGBUR SMALL (A) 01/26/2013 2059   HGBUR negative 09/19/2007 0000   BILIRUBINUR n 11/18/2014 1003   KETONESUR NEGATIVE 01/26/2013 2059   PROTEINUR 3+ 11/18/2014 1003   PROTEINUR >300 (A) 01/26/2013 2059   UROBILINOGEN 0.2 11/18/2014 1003   UROBILINOGEN 1.0 01/26/2013 2059   NITRITE n 11/18/2014 1003   NITRITE NEGATIVE 01/26/2013 2059   LEUKOCYTESUR Negative 11/18/2014 1003   Radiology Studies: Ct Chest Wo Contrast  Result Date: 11/03/2016 CLINICAL DATA:  Abnormal chest x-ray EXAM: CT CHEST WITHOUT CONTRAST TECHNIQUE: Multidetector CT imaging of the chest was performed following the standard protocol without IV contrast. COMPARISON:  Plain film from earlier in the same day FINDINGS: Cardiovascular: Aortic atherosclerotic changes noted without aneurysmal dilatation. No cardiac enlargement is seen. Coronary calcifications are noted. Mediastinum/Nodes: The thoracic inlet is within normal limits. No significant hilar or mediastinal adenopathy is identified. Lungs/Pleura: The lungs are well aerated bilaterally. A vague 6 mm nodular density is noted in the left lower lobe best seen on image number 88 of series 3. Additionally there is a more marked area of ground-glass attenuation in the right upper lobe with some adjacent pleural thickening. This measures approximately 19 x 16 mm. This corresponds to that seen on the prior plain film examination. The solid component within this area of approximately 8 mm is noted. Upper Abdomen: Right adrenal lesion is noted stable from previous CT from 2013 consistent with an adenoma. Musculoskeletal: Degenerative changes of the thoracic spine are seen. IMPRESSION: In the right upper lobe, there is a sub solid nodule identified which has a solid  component with a mean measurement of approximately 7 mm. (9 mm x 5 mm) the larger area of ground-glass attenuation is noted surrounding the solid component. Follow-up examination is recommended in 3-6 months to confirm persistence or resolution. 6 mm nodule in the root left lower lobe best seen on image number 88 of series 3. This can also be followed at the time of re- evaluation of the right upper lobe lesion. Stable right adrenal adenoma No other focal abnormality is noted. Electronically  Signed   By: Inez Catalina M.D.   On: 11/03/2016 14:22   Dg Abd Acute W/chest  Result Date: 11/03/2016 CLINICAL DATA:  Mid to lower abdominal pain today, GI bleeding, question free air EXAM: DG ABDOMEN ACUTE W/ 1V CHEST COMPARISON:  06/01/2016 FINDINGS: Normal heart size, mediastinal contours, and pulmonary vascularity. Questionable poorly defined RIGHT upper lobe nodular density versus focus of infiltrate or summation artifact. Lungs otherwise clear. No pleural effusion or pneumothorax. Atherosclerotic calcifications aorta. Normal bowel gas pattern. No bowel dilatation, bowel wall thickening, or free air. Diffuse osseous demineralization. Surgical clips in pelvis bilaterally, by history likely related to prostate surgery. No urine tract calcification. IMPRESSION: Normal bowel gas pattern without evidence of free air or bowel obstruction. Aortic atherosclerosis. Date RIGHT upper lobe nodular density, could represent a focus of infiltrate or a summation artifact though a faint nodular density is not excluded. Either radiographic followup until resolution or CT recommended to exclude pulmonary tumor. Electronically Signed   By: Lavonia Dana M.D.   On: 11/03/2016 10:32      Scheduled Meds: . amLODipine  10 mg Oral Daily  . atorvastatin  20 mg Oral q1800  . doxazosin  1 mg Oral Daily  . insulin aspart  0-15 Units Subcutaneous Q4H  . metoprolol succinate  25 mg Oral Daily  . [START ON 11/06/2016] pantoprazole  40 mg  Intravenous Q12H   Continuous Infusions: . sodium chloride 125 mL/hr at 11/03/16 2340     LOS: 0 days    Time spent: 20 minutes    Faye Ramsay, MD [facility] Pager [phone]  If 7PM-7AM, please contact night-coverage [email or web address] Password [facility] 11/04/2016, 5:12 PM

## 2016-11-04 NOTE — Brief Op Note (Signed)
11/03/2016 - 11/04/2016  11:54 AM  PATIENT:  Austin [name]  82 y.o. male  PRE-OPERATIVE DIAGNOSIS:  GI bleed  POST-OPERATIVE DIAGNOSIS:  gastric polyps  PROCEDURE:  Procedure(s): ESOPHAGOGASTRODUODENOSCOPY (EGD) WITH PROPOFOL (N/A)  SURGEON:  Surgeon(s) and Role:    * Otis Brace, MD - Primary  Recommendations/findings ------------------------------------ - Patient with large polyp in the gastric body which appeared ulcerated and had stigmata of recent bleeding. - Although Plavix was only on hold for 2 days, his chance of recurrence bleeding if this polyp was not removed felt to be higher in this polyp was removed in piecemeal fashion with the hot snare. There was no bleeding at the end of procedure. - Full liquid diet today. - Monitor H&H. Keep in the hospital for observation for one more night - Recommend holding Plavix for 5 more days - Recommend repeat EGD in 8 weeks to remove remaining polyps and to recheck on incompletely resected polyp after stopping Plavix for 5 days - Continue IV twice a day PPI for now - GI will follow.

## 2016-11-04 NOTE — Care Management Obs Status (Signed)
[place] NOTIFICATION   Patient Details  Name: Austin [name] MRN: [id number] Date of Birth: 07/07/1935   Medicare Observation Status Notification Given:  Yes    Sharin Mons, RN 11/04/2016, 4:03 PM

## 2016-11-04 NOTE — Anesthesia Preprocedure Evaluation (Addendum)
Anesthesia Evaluation  Patient identified by MRN, date of birth, ID band Patient awake    Reviewed: Allergy & Precautions, NPO status , Patient's Chart, lab work & pertinent test results, reviewed documented beta blocker date and time   Airway Mallampati: II  TM Distance: >3 FB Neck ROM: Full  Mouth opening: Limited Mouth Opening  Dental no notable dental hx. (+) Teeth Intact, Dental Advisory Given   Pulmonary former smoker,    Pulmonary exam normal        Cardiovascular hypertension, Pt. on home beta blockers and Pt. on medications + Peripheral Vascular Disease  Normal cardiovascular exam  Less than 40% dz per 2016 exam   Neuro/Psych negative psych ROS   GI/Hepatic GERD  Medicated and Controlled,  Endo/Other  diabetes  Renal/GU Renal InsufficiencyRenal disease  negative genitourinary   Musculoskeletal negative musculoskeletal ROS (+)   Abdominal Normal abdominal exam  (+)   Peds  Hematology  (+) anemia ,   Anesthesia Other Findings   Reproductive/Obstetrics                            Anesthesia Physical Anesthesia Plan  ASA: II  Anesthesia Plan: MAC   Post-op Pain Management:    Induction: Intravenous  Airway Management Planned: Natural Airway and Simple Face Mask  Additional Equipment:   Intra-op Plan:   Post-operative Plan:   Informed Consent: I have reviewed the patients History and Physical, chart, labs and discussed the procedure including the risks, benefits and alternatives for the proposed anesthesia with the patient or authorized representative who has indicated his/her understanding and acceptance.   Dental advisory given  Plan Discussed with: CRNA, Anesthesiologist and Surgeon  Anesthesia Plan Comments:        Anesthesia Quick Evaluation

## 2016-11-04 NOTE — Progress Notes (Signed)
Hypoglycemic Event  CBG: 59  Treatment: 15 GM carbohydrate snack  Symptoms: None  Follow-up CBG: Time:0018 CBG Result:88  Possible Reasons for Event: Inadequate meal intake  Comments/MD notified:Lynch, NP    Markhi Kleckner N Milissa Fesperman

## 2016-11-05 DIAGNOSIS — I10 Essential (primary) hypertension: Secondary | ICD-10-CM

## 2016-11-05 DIAGNOSIS — K921 Melena: Secondary | ICD-10-CM

## 2016-11-05 LAB — BASIC METABOLIC PANEL
Anion gap: 8 (ref 5–15)
BUN: 27 mg/dL — AB (ref 6–20)
CHLORIDE: 111 mmol/L (ref 101–111)
CO2: 25 mmol/L (ref 22–32)
Calcium: 9.2 mg/dL (ref 8.9–10.3)
Creatinine, Ser: 1.45 mg/dL — ABNORMAL HIGH (ref 0.61–1.24)
GFR calc Af Amer: 51 mL/min — ABNORMAL LOW (ref >60)
GFR, EST NON AFRICAN AMERICAN: 44 mL/min — AB (ref >60)
GLUCOSE: 116 mg/dL — AB (ref 65–99)
POTASSIUM: 3.2 mmol/L — AB (ref 3.5–5.1)
Sodium: 144 mmol/L (ref 135–145)

## 2016-11-05 LAB — CBC
HEMATOCRIT: 24.4 % — AB (ref 39.0–52.0)
HEMOGLOBIN: 8 g/dL — AB (ref 13.0–17.0)
MCH: 26.6 pg (ref 26.0–34.0)
MCHC: 32.8 g/dL (ref 30.0–36.0)
MCV: 81.1 fL (ref 78.0–100.0)
Platelets: 288 10*3/uL (ref 150–400)
RBC: 3.01 MIL/uL — AB (ref 4.22–5.81)
RDW: 15.7 % — ABNORMAL HIGH (ref 11.5–15.5)
WBC: 9.8 10*3/uL (ref 4.0–10.5)

## 2016-11-05 LAB — GLUCOSE, CAPILLARY
GLUCOSE-CAPILLARY: 111 mg/dL — AB (ref 65–99)
Glucose-Capillary: 115 mg/dL — ABNORMAL HIGH (ref 65–99)
Glucose-Capillary: 166 mg/dL — ABNORMAL HIGH (ref 65–99)
Glucose-Capillary: 88 mg/dL (ref 65–99)

## 2016-11-05 MED ORDER — CLOPIDOGREL BISULFATE 75 MG PO TABS: 75.0000 mg | ORAL_TABLET | Freq: Every day | ORAL | 0 refills | Status: DC

## 2016-11-05 MED ORDER — ESOMEPRAZOLE MAGNESIUM 40 MG PO CPDR: 40.0000 mg | DELAYED_RELEASE_CAPSULE | Freq: Two times a day (BID) | ORAL | 3 refills | Status: DC

## 2016-11-05 NOTE — Progress Notes (Signed)
Norm Salt to be D/C'd  per MD order.  Discussed with the patient and all questions fully answered.  VSS, Skin clean, dry and intact without evidence of skin break down, no evidence of skin tears noted. IV catheter discontinued intact. Site without signs and symptoms of complications. Dressing and pressure applied.  An After Visit Summary was printed and given to the patient. Patient received prescription.  D/c education completed with patient/family including follow up instructions, medication list, d/c activities limitations if indicated, with other d/c instructions as indicated by MD - patient able to verbalize understanding, all questions fully answered.   Patient instructed to return to ED, call 911, or call MD for any changes in condition.   Patient escorted via [place], and D/C home via private auto.  Milas Hock 11/05/2016 1:12 PM

## 2016-11-05 NOTE — Progress Notes (Signed)
[facility] Progress Note  Austin [name] 82 y.o. 1934/10/17  CC:  GI bleed.   Subjective:  Patient status post EGD yesterday. Showed possible bleeding from inflammatory polyp. Status post polypectomy. No acute issues since procedure. No further bowel movements. Hemoglobin and vitals stable.  ROS : Negative for active bleeding. Negative for abdominal pain, nausea and vomiting.   Objective: Vital signs in last 24 hours: Vitals:   11/04/16 2118 11/05/16 0445  BP: 139/60 (!) 153/64  Pulse: 72 66  Resp: 18 18  Temp: 98.2 F (36.8 C) 97.8 F (36.6 C)    Physical Exam:  General:  Alert, cooperative, no distress, appears stated age  Head:  Normocephalic, without obvious abnormality, atraumatic  Eyes:  , EOM's intact,   Lungs:   Clear to auscultation bilaterally, respirations unlabored  Heart:  Regular rate and rhythm, S1, S2 normal  Abdomen:   Soft, non-tender, bowel sounds active all four quadrants,  no masses,   Extremities: Extremities normal, atraumatic, no  edema       Lab Results:  Recent Labs  11/04/16 0041 11/04/16 0953 11/05/16 0612  NA 144 152* 144  K 2.7* 3.2* 3.2*  CL 111  --  111  CO2 26  --  25  GLUCOSE 119* 152* 116*  BUN 63*  --  27*  CREATININE 1.90*  --  1.45*  CALCIUM 9.1  --  9.2    Recent Labs  11/03/16 0915  AST 18  ALT 9*  ALKPHOS 62  BILITOT 0.5  PROT 6.6  ALBUMIN 3.8    Recent Labs  11/03/16 0915  11/04/16 0041 11/04/16 0953 11/05/16 0612  WBC 15.2*  --  11.0*  --  9.8  NEUTROABS 14.3*  --   --   --   --   HGB 9.8*  < > 8.2*  8.2* 8.5* 8.0*  HCT 29.6*  < > 24.5*  24.4* 25.0* 24.4*  MCV 79.1  --  78.3  --  81.1  PLT 353  --  285  --  288  < > = values in this interval not displayed.  Recent Labs  11/04/16 0041  LABPROT 14.4  INR 1.11      Assessment/Plan: - Melena. EGD showed possible bleeding from inflammatory polyp. This was removed to prevent further bleeding episodes - Epigastric abdominal  pain.resolved.  - Acute drop in hemoglobin. Hemoglobin relatively  stable now. - Occult blood positive stool. Most likely from upper GI bleed. Last colonoscopy in 2008 was normal. - Chronic kidney disease - Coronary artery disease. Currently on Plavix. Last dose the night before admission  Recommendations ----------------------- - Advance diet to heart healthy/ diabetic diet - Recommend holding Plavix for 5 days after  Procedure if okay with primary team - Continue twice a day PPI for next 4-6 weeks, followed by once a day PPI  - Patient will need repeat EGD for further treatment/evaluation - Okay to discharge from GI standpoint. GI will sign off. Call us back if needed. - Follow-up in GI clinic in 4 weeks.   Otis Brace MD, FACP 11/05/2016, 8:11 AM  Pager [phone]  If no answer or after 5 PM call [phone]

## 2016-11-05 NOTE — Discharge Instructions (Signed)
Gastrointestinal Bleeding °Gastrointestinal (GI) bleeding is bleeding somewhere along the digestive tract, between the mouth and anus. This can be caused by various problems. The severity of these problems can range from mild to serious or even life-threatening. If you have GI bleeding, you may find blood in your stools (feces), you may have black stools, or you may vomit blood. If there is a lot of bleeding, you may need to stay in the hospital. °What are the causes? °This condition may be caused by: °· Esophagitis. This is inflammation, irritation, or swelling of the esophagus. °· Hemorrhoids. These are swollen veins in the rectum. °· Anal fissures. These are areas of painful tearing that are often caused by passing hard stool. °· Diverticulosis. These are pouches that form on the colon over time, with age, and may bleed a lot. °· Diverticulitis. This is inflammation in areas with diverticulosis. It can cause pain, fever, and bloody stools, although bleeding may be mild. °· Polyps and cancer. Colon cancer often starts out as precancerous polyps. °· Gastritis and ulcers. With these, bleeding may come from the upper GI tract, near the stomach. °What are the signs or symptoms? °Symptoms of this condition may include: °· Bright red blood in your vomit, or vomit that looks like coffee grounds. °· Bloody, black, or tarry stools. °¨ Bleeding from the lower GI tract will usually cause red or maroon blood in the stools. °¨ Bleeding from the upper GI tract may cause black, tarry, often bad-smelling stools. °¨ In certain cases, if the bleeding is fast enough, the stools may be red. °· Pain or cramping in the abdomen. °How is this diagnosed? °This condition may be diagnosed based on: °· Medical history and physical exam. °· Various tests, such as: °¨ Blood tests. °¨ X-rays and other imaging tests. °¨ Esophagogastroduodenoscopy (EGD). In this test, a flexible, lighted tube is used to look at your esophagus, stomach, and small  intestine. °¨ Colonoscopy. In this test, a flexible, lighted tube is used to look at your colon. °How is this treated? °Treatment for this condition depends on the cause of the bleeding. For example: °· For bleeding from the esophagus, stomach, small intestine, or colon, the health care provider doing your EGD or colonoscopy may be able to stop the bleeding as part of the procedure. °· Inflammation or infection of the colon can be treated with medicines. °· Certain rectal problems can be treated with creams, suppositories, or warm baths. °· Surgery is sometimes needed. °· Blood transfusions are sometimes needed if a lot of blood has been lost. °If bleeding is slow, you may be allowed to go home. If there is a lot of bleeding, you will need to stay in the hospital for observation. °Follow these instructions at home: °· Take over-the-counter and prescription medicines only as told by your health care provider. °· Eat foods that are high in fiber. This will help to keep your stools soft. These foods include whole grains, legumes, fruits, and vegetables. Eating 1-3 prunes each day works well for many people. °· Drink enough fluid to keep your urine clear or pale yellow. °· Keep all follow-up visits as told by your health care provider. This is important. °Contact a health care provider if: °· Your symptoms do not improve. °Get help right away if: °· Your bleeding increases. °· You feel light-headed or you faint. °· You feel weak. °· You have severe cramps in your back or abdomen. °· You pass large blood clots in your stool. °· Your symptoms are   getting worse. °This information is not intended to replace advice given to you by your health care provider. Make sure you discuss any questions you have with your health care provider. °Document Released: 08/05/2000 Document Revised: 01/06/2016 Document Reviewed: 01/26/2015 °Elsevier Interactive Patient Education © 2017 [employer] ° °

## 2016-11-05 NOTE — Care Management Note (Signed)
Case Management Note  Patient Details  Name: Austin [name] MRN: [id number] Date of Birth: 1934/10/02  Subjective/Objective:                 Patient with DC order to home, reviewed chart for CM consult/ orders/ needs, none identified at this time.    Action/Plan:  DC to home  Expected Discharge Date:  11/05/16               Expected Discharge Plan:  Home/Self Care  In-House Referral:     Discharge planning Services  CM Consult  Post Acute Care Choice:    Choice offered to:     DME Arranged:    DME Agency:     HH Arranged:    HH Agency:     Status of Service:  Completed, signed off  If discussed at [employer] of Stay Meetings, dates discussed:    Additional Comments:  Carles Collet, RN 11/05/2016, 1:22 PM

## 2016-11-05 NOTE — Discharge Summary (Signed)
Physician Discharge Summary  Austin [name] EPP:[id number] DOB: Jan 11, 1935 DOA: 11/03/2016  PCP: Nyoka Cowden, MD  Admit date: 11/03/2016 Discharge date: 11/05/2016  Recommendations for Outpatient Follow-up:  1. Pt will need to follow up with PCP in 2-3 weeks post discharge 2. Please obtain BMP to evaluate electrolytes and kidney function 3. Please also check CBC to evaluate Hg and Hct levels 4. Recommend holding Plavix for 5 days 5. Per GI team recommendations, continue twice a day PPI for next 4 weeks, followed by once a day PPI  6. Patient will need repeat EGD for further treatment/evaluation 7. Follow-up in GI clinic in 4 weeks.  Discharge Diagnoses:  Active Problems:   Diabetes mellitus with renal complications ([place])   Essential hypertension   Melena  Discharge Condition: Stable  Diet recommendation: Heart healthy diet discussed in details   Brief Narrative:  82 y.o.malewith past medical history of anemia, carotid artery stenosis on plavix, diabetes, GERD, stomach ulcers, hyper lipidemia, hypertension, prostate cancer history, chronic renal disease, presented to emergency room with main concern of black stools. Pt reports he takes aspirin sporadically.   Assessment & Plan:  Acute blood loss anemia, Blood in stool - EGD with  - Normal mucosa was found in the entire esophagus. - One gastric polyp. Incomplete resection. Resected tissue retrieved. - One gastric polyp. Resected and retrieved. - Normal duodenal bulb, first portion of the duodenum and second portion of the duodenum.  - recommendation is to hold Plavix for 5 days - Repeat upper endoscopy in 2 months for removal of other polyps as well as for follow up of incompletely removed inflammatory polyp in the body - Return to GI office in 4 weeks  Acute on chronic kidney disease, stage III  - in the setting of acute blood loss anemia - improving  - outpatient follow up  Hypokalemia  - supplemented prior  to discharge   Hypernatremia - unclear why sodium was up - diet advanced and Na is now WNL    DVT prophylaxis: SCD's Code Status: Full  Family Communication: Patient at bedside  Disposition Plan: Home, GI team cleared for discharge   Consultants:   GI  Procedures:   EGD  Antimicrobials:   None  Procedures/Studies: Ct Chest Wo Contrast  Result Date: 11/03/2016 CLINICAL DATA:  Abnormal chest x-ray EXAM: CT CHEST WITHOUT CONTRAST TECHNIQUE: Multidetector CT imaging of the chest was performed following the standard protocol without IV contrast. COMPARISON:  Plain film from earlier in the same day FINDINGS: Cardiovascular: Aortic atherosclerotic changes noted without aneurysmal dilatation. No cardiac enlargement is seen. Coronary calcifications are noted. Mediastinum/Nodes: The thoracic inlet is within normal limits. No significant hilar or mediastinal adenopathy is identified. Lungs/Pleura: The lungs are well aerated bilaterally. A vague 6 mm nodular density is noted in the left lower lobe best seen on image number 88 of series 3. Additionally there is a more marked area of ground-glass attenuation in the right upper lobe with some adjacent pleural thickening. This measures approximately 19 x 16 mm. This corresponds to that seen on the prior plain film examination. The solid component within this area of approximately 8 mm is noted. Upper Abdomen: Right adrenal lesion is noted stable from previous CT from 2013 consistent with an adenoma. Musculoskeletal: Degenerative changes of the thoracic spine are seen. IMPRESSION: In the right upper lobe, there is a sub solid nodule identified which has a solid component with a mean measurement of approximately 7 mm. (9 mm x 5 mm) the larger  area of ground-glass attenuation is noted surrounding the solid component. Follow-up examination is recommended in 3-6 months to confirm persistence or resolution. 6 mm nodule in the root left lower lobe best seen  on image number 88 of series 3. This can also be followed at the time of re- evaluation of the right upper lobe lesion. Stable right adrenal adenoma No other focal abnormality is noted. Electronically Signed   By: Inez Catalina M.D.   On: 11/03/2016 14:22   Dg Abd Acute W/chest  Result Date: 11/03/2016 CLINICAL DATA:  Mid to lower abdominal pain today, GI bleeding, question free air EXAM: DG ABDOMEN ACUTE W/ 1V CHEST COMPARISON:  06/01/2016 FINDINGS: Normal heart size, mediastinal contours, and pulmonary vascularity. Questionable poorly defined RIGHT upper lobe nodular density versus focus of infiltrate or summation artifact. Lungs otherwise clear. No pleural effusion or pneumothorax. Atherosclerotic calcifications aorta. Normal bowel gas pattern. No bowel dilatation, bowel wall thickening, or free air. Diffuse osseous demineralization. Surgical clips in pelvis bilaterally, by history likely related to prostate surgery. No urine tract calcification. IMPRESSION: Normal bowel gas pattern without evidence of free air or bowel obstruction. Aortic atherosclerosis. Date RIGHT upper lobe nodular density, could represent a focus of infiltrate or a summation artifact though a faint nodular density is not excluded. Either radiographic followup until resolution or CT recommended to exclude pulmonary tumor. Electronically Signed   By: Lavonia Dana M.D.   On: 11/03/2016 10:32     Discharge Exam: Vitals:   11/05/16 0445 11/05/16 0835  BP: (!) 153/64 (!) 148/60  Pulse: 66   Resp: 18   Temp: 97.8 F (36.6 C)    Vitals:   11/05/16 0148 11/05/16 0445 11/05/16 0500 11/05/16 0835  BP: (!) 148/60 (!) 153/64  (!) 148/60  Pulse: 68 66    Resp:  18    Temp:  97.8 F (36.6 C)    TempSrc:  Oral    SpO2:  96%    Weight:   83 kg (182 lb 14.4 oz)   Height:        General: Pt is alert, follows commands appropriately, not in acute distress Cardiovascular: Regular rate and rhythm, S1/S2 +, no rubs, no  gallops Respiratory: Clear to auscultation bilaterally, no wheezing, no crackles, no rhonchi Abdominal: Soft, non tender, non distended, bowel sounds +, no guarding  Discharge Instructions  Discharge Instructions    Diet - low sodium heart healthy    Complete by:  As directed    Increase activity slowly    Complete by:  As directed      Allergies as of 11/05/2016      Reactions   Aspirin Other (See Comments)   High doses causes stomach ulcer and bleeding      Medication List    TAKE these medications   amlodipine-atorvastatin 10-20 MG tablet Commonly known as:  CADUET TAKE 1 TABLET BY MOUTH EVERY DAY   clopidogrel 75 MG tablet Commonly known as:  PLAVIX Take 1 tablet (75 mg total) by mouth daily. Start taking on March 23rd, 2018 Start taking on:  11/11/2016 What changed:  See the new instructions.   doxazosin 1 MG tablet Commonly known as:  CARDURA Take 1 mg by mouth daily.   esomeprazole 40 MG capsule Commonly known as:  NEXIUM Take 1 capsule (40 mg total) by mouth 2 (two) times daily before a meal. Take 40 mg capsule twice daily for 4 weeks and then resume previus regimen 40 mg capsule once daily  What changed:  See the new instructions.   glipiZIDE 5 MG 24 hr tablet Commonly known as:  GLUCOTROL XL TAKE 1 TABLET BY MOUTH EVERY DAY   hydrocortisone-pramoxine 2.5-1 % rectal cream Commonly known as:  ANALPRAM-HC USE ONCE DAILY OR AS DIRECTED   KLOR-CON M20 20 MEQ tablet Generic drug:  potassium chloride SA TAKE 1 TABLET BY MOUTH EVERY DAY   losartan-hydrochlorothiazide 100-25 MG tablet Commonly known as:  HYZAAR Take 1 tablet by mouth daily.   metoprolol succinate 25 MG 24 hr tablet Commonly known as:  TOPROL-XL Take 2 tablets (50 mg total) by mouth daily. What changed:  how much to take   ONETOUCH DELICA LANCETS 88F Misc USE TO CHECK BLOOD SUGAR DAILY AND AS NEEDED   ONETOUCH VERIO test strip Generic drug:  glucose blood USE TO CHECK BLOOD SUGAR DAILY  AND AS NEEDED      Follow-up Information    Nyoka Cowden, MD Follow up.   Specialty:  Internal Medicine Contact information: [place] [address] [phone]        Otis Brace, MD. Schedule an appointment as soon as possible for a visit.   Specialty:  Gastroenterology Contact information: [place] [address] [phone]            The results of significant diagnostics from this hospitalization (including imaging, microbiology, ancillary and laboratory) are listed below for reference.     Microbiology: No results found for this or any previous visit (from the past 240 hour(s)).   Labs: Basic Metabolic Panel:  Recent Labs Lab 11/03/16 0915 11/04/16 0041 11/04/16 0953 11/05/16 0612  NA 143 144 152* 144  K 3.6 2.7* 3.2* 3.2*  CL 105 111  --  111  CO2 26 26  --  25  GLUCOSE 164* 119* 152* 116*  BUN 82* 63*  --  27*  CREATININE 2.29* 1.90*  --  1.45*  CALCIUM 10.1 9.1  --  9.2   Liver Function Tests:  Recent Labs Lab 11/03/16 0915  AST 18  ALT 9*  ALKPHOS 62  BILITOT 0.5  PROT 6.6  ALBUMIN 3.8   CBC:  Recent Labs Lab 11/03/16 0915  11/03/16 1554 11/03/16 2023 11/04/16 0041 11/04/16 0953 11/05/16 0612  WBC 15.2*  --   --   --  11.0*  --  9.8  NEUTROABS 14.3*  --   --   --   --   --   --   HGB 9.8*  < > 8.6* 8.5* 8.2*  8.2* 8.5* 8.0*  HCT 29.6*  < > 26.0* 25.2* 24.5*  24.4* 25.0* 24.4*  MCV 79.1  --   --   --  78.3  --  81.1  PLT 353  --   --   --  285  --  288  < > = values in this interval not displayed.  CBG:  Recent Labs Lab 11/04/16 2114 11/04/16 2348 11/05/16 0018 11/05/16 0441 11/05/16 0823  GLUCAP 167* 59* 88 115* 111*   SIGNED: Time coordinating discharge:  30 minutes  MAGICK-Lexine Jaspers, MD  [facility] 11/05/2016, 10:50 AM Pager [phone]  If 7PM-7AM, please contact night-coverage [email or web address] Password [email or web address]

## 2016-11-06 ENCOUNTER — Encounter (HOSPITAL_COMMUNITY): Payer: Self-pay | Admitting: Gastroenterology

## 2016-11-07 ENCOUNTER — Telehealth: Payer: Self-pay

## 2016-11-07 NOTE — Telephone Encounter (Signed)
D/C: 11/05/16 To: home  Spoke with pt and he states that he is doing well. He reports that he is improving daily. He denies any pain or bleeding. He does not have any questions or concerns at this time.   Appt scheduled with Dr. Elease Hashimoto (as Dr. Raliegh Ip is not in office) 11/09/16, pt aware.    Transition Care Management Follow-up Telephone Call  How have you been since you were released from the hospital? Good, improving daily   Do you understand why you were in the hospital? yes   Do you understand the discharge instrcutions? yes  Items Reviewed:  Medications reviewed: yes  Allergies reviewed: yes  Dietary changes reviewed: yes  Referrals reviewed: yes   Functional Questionnaire:   Activities of Daily Living (ADLs):   He states they are independent in the following: ambulation, bathing and hygiene, feeding, continence, grooming, toileting and dressing States they require assistance with the following: none   Any transportation issues/concerns?: no   Any patient concerns? no   Confirmed importance and date/time of follow-up visits scheduled: yes   Confirmed with patient if condition begins to worsen call PCP or go to the ER.  Patient was given the Call-a-Nurse line [phone]: yes

## 2016-11-09 ENCOUNTER — Ambulatory Visit (INDEPENDENT_AMBULATORY_CARE_PROVIDER_SITE_OTHER): Payer: Medicare Other | Admitting: Family Medicine

## 2016-11-09 ENCOUNTER — Encounter: Payer: Self-pay | Admitting: Family Medicine

## 2016-11-09 VITALS — BP 130/60 | HR 67 | Temp 98.4°F | Wt 189.6 lb

## 2016-11-09 DIAGNOSIS — K921 Melena: Secondary | ICD-10-CM | POA: Diagnosis not present

## 2016-11-09 DIAGNOSIS — I10 Essential (primary) hypertension: Secondary | ICD-10-CM

## 2016-11-09 DIAGNOSIS — D500 Iron deficiency anemia secondary to blood loss (chronic): Secondary | ICD-10-CM | POA: Diagnosis not present

## 2016-11-09 LAB — BASIC METABOLIC PANEL
BUN: 25 mg/dL — ABNORMAL HIGH (ref 6–23)
CHLORIDE: 101 meq/L (ref 96–112)
CO2: 31 mEq/L (ref 19–32)
Calcium: 9.8 mg/dL (ref 8.4–10.5)
Creatinine, Ser: 2.32 mg/dL — ABNORMAL HIGH (ref 0.40–1.50)
GFR: 35 mL/min — AB (ref >60.00)
Glucose, Bld: 139 mg/dL — ABNORMAL HIGH (ref 70–99)
Potassium: 3.6 mEq/L (ref 3.5–5.1)
SODIUM: 137 meq/L (ref 135–145)

## 2016-11-09 LAB — CBC WITH DIFFERENTIAL/PLATELET
BASOS PCT: 0.6 % (ref 0.0–3.0)
Basophils Absolute: 0 10*3/uL (ref 0.0–0.1)
EOS ABS: 0.1 10*3/uL (ref 0.0–0.7)
Eosinophils Relative: 1.4 % (ref 0.0–5.0)
HCT: 25.4 % — ABNORMAL LOW (ref 39.0–52.0)
LYMPHS ABS: 1.1 10*3/uL (ref 0.7–4.0)
Lymphocytes Relative: 14.3 % (ref 12.0–46.0)
MCHC: 33.9 g/dL (ref 30.0–36.0)
MCV: 80.1 fl (ref 78.0–100.0)
MONO ABS: 0.5 10*3/uL (ref 0.1–1.0)
Monocytes Relative: 6.1 % (ref 3.0–12.0)
Neutro Abs: 6.2 10*3/uL (ref 1.4–7.7)
Neutrophils Relative %: 77.6 % — ABNORMAL HIGH (ref 43.0–77.0)
PLATELETS: 395 10*3/uL (ref 150.0–400.0)
RBC: 3.17 Mil/uL — ABNORMAL LOW (ref 4.22–5.81)
RDW: 15 % (ref 11.5–15.5)
WBC: 8 10*3/uL (ref 4.0–10.5)

## 2016-11-09 NOTE — Progress Notes (Signed)
Pre visit review using our clinic review tool, if applicable. No additional management support is needed unless otherwise documented below in the visit note. 

## 2016-11-09 NOTE — Progress Notes (Signed)
Subjective:     Patient ID: Austin [name], male   DOB: 1934-12-12, 82 y.o.   MRN: [id number]  HPI Patient seen for hospital follow-up. His chronic problems include history of hypertension, GERD, type 2 diabetes, remote history of prostate cancer, dyslipidemia. He was admitted on 3/15 after noticing increased generalized weakness and black stool. He apparently had remote history of duodenal ulcer many years ago. He had some mild nausea but no vomiting and no significant abdominal pain. He was noted to be anemic and underwent EGD which showed normal esophagus. He had one gastric polyp which was incompletely resected and another gastric polyp which was resected. No evidence for duodenal ulcer.  Patient's hemoglobin stabilized around 8.0. It was recommended he undergo repeat endoscopy in about 2 months for reassessment.  He has not noted any melena since discharge. No dizziness. Appetite and weight are stable.  He had mild hypokalemia during admission. Patient normally takes Plavix and was instructed to hold this until 11/11/16. He is taking all his other usual medications. Diabetes has been fairly well controlled. A1c 7.2% back in January.  Past Medical History:  Diagnosis Date  . 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 CAROTID ARTERY STENOSIS 05/10/2010  . DIABETES MELLITUS, TYPE II 09/19/2007  . DISEASE, CEREBROVASCULAR NEC 03/05/2007  . GERD 03/13/2007  . HYPERLIPIDEMIA 03/05/2007  . HYPERTENSION 03/05/2007  . HYPOKALEMIA 11/09/2009  . KNEE PAIN, RIGHT 11/09/2009  . PROSTATE CANCER, HX OF 03/05/2007  . RENAL DISEASE, CHRONIC 02/03/2009   Past Surgical History:  Procedure Laterality Date  . APPENDECTOMY  02-20-12  . CAROTID ARTERY ANGIOPLASTY Right Oct. 10, 2001  . ESOPHAGOGASTRODUODENOSCOPY (EGD) WITH PROPOFOL N/A 11/04/2016   Procedure: ESOPHAGOGASTRODUODENOSCOPY (EGD) WITH PROPOFOL;  Surgeon: Otis Brace, MD;  Location: [facility];  Service: Gastroenterology;  Laterality: N/A;  .  LAPAROSCOPIC APPENDECTOMY  02/20/2012   Procedure: APPENDECTOMY LAPAROSCOPIC;  Surgeon: Stark Klein, MD;  Location: [facility];  Service: General;  Laterality: N/A;  . PROSTATE SURGERY     prostatectomy    reports that he quit smoking about 42 years ago. His smoking use included Cigarettes. He has never used smokeless tobacco. He reports that he does not drink alcohol or use drugs. family history includes Cancer in his brother and father; Hypertension in his mother. Allergies  Allergen Reactions  . Aspirin Other (See Comments)    High doses causes stomach ulcer and bleeding     Review of Systems  Constitutional: Negative for fatigue and unexpected weight change.  Eyes: Negative for visual disturbance.  Respiratory: Negative for cough, chest tightness and shortness of breath.   Cardiovascular: Negative for chest pain, palpitations and leg swelling.  Gastrointestinal: Negative for abdominal distention, abdominal pain, blood in stool, constipation, diarrhea, nausea and vomiting.  Genitourinary: Negative for dysuria.  Neurological: Negative for dizziness, syncope, weakness, light-headedness and headaches.  Psychiatric/Behavioral: Negative for confusion.       Objective:   Physical Exam  Constitutional: He is oriented to person, place, and time. He appears well-developed and well-nourished.  HENT:  Mouth/Throat: Oropharynx is clear and moist.  Neck: Neck supple.  Cardiovascular: Normal rate and regular rhythm.   Pulmonary/Chest: Effort normal and breath sounds normal. No respiratory distress. He has no wheezes. He has no rales.  Abdominal: Soft. Bowel sounds are normal. He exhibits no distension and no mass. There is no tenderness. There is no rebound and no guarding.  Musculoskeletal: He exhibits no edema.  Neurological: He is alert and oriented to person,  place, and time.  Skin:  Conjunctiva nailbeds are pale bilaterally  Psychiatric: He has a normal mood and affect. His behavior is  normal.       Assessment:     #1 recent upper GI hemorrhage related to presumably polyp bleed  #2 anemia related to #1  #3 type 2 diabetes which has been fairly well controlled  #4 chronic kidney disease stage III  #5 recent mild hypokalemia    Plan:     -Check labs today with CBC and basic metabolic panel -Resume Plavix on 11/11/16 -Close follow-up with GI as instructed -Follow-up immediately for any recurrent melena, dizziness, or other concerns -pt inquired about repeating A1c today we've recommended waiting since this was checked only 2 months ago.  Eulas Post MD [facility]

## 2016-11-09 NOTE — Patient Instructions (Signed)
Follow up for any melena or any increased dizziness Start back the Plavix this Friday.

## 2016-11-25 LAB — PSA: PSA: 0.23

## 2016-11-27 ENCOUNTER — Other Ambulatory Visit: Payer: Self-pay | Admitting: Internal Medicine

## 2016-12-02 ENCOUNTER — Other Ambulatory Visit: Payer: Self-pay | Admitting: Internal Medicine

## 2016-12-12 ENCOUNTER — Encounter: Payer: Self-pay | Admitting: Internal Medicine

## 2016-12-12 ENCOUNTER — Ambulatory Visit (INDEPENDENT_AMBULATORY_CARE_PROVIDER_SITE_OTHER): Payer: Medicare Other | Admitting: Internal Medicine

## 2016-12-12 ENCOUNTER — Other Ambulatory Visit: Payer: Self-pay | Admitting: Internal Medicine

## 2016-12-12 VITALS — BP 126/50 | Temp 98.2°F | Ht 67.0 in | Wt 188.0 lb

## 2016-12-12 DIAGNOSIS — D500 Iron deficiency anemia secondary to blood loss (chronic): Secondary | ICD-10-CM | POA: Diagnosis not present

## 2016-12-12 DIAGNOSIS — E0821 Diabetes mellitus due to underlying condition with diabetic nephropathy: Secondary | ICD-10-CM | POA: Diagnosis not present

## 2016-12-12 DIAGNOSIS — I10 Essential (primary) hypertension: Secondary | ICD-10-CM

## 2016-12-12 LAB — BASIC METABOLIC PANEL
BUN: 30 mg/dL — ABNORMAL HIGH (ref 6–23)
CHLORIDE: 104 meq/L (ref 96–112)
CO2: 31 mEq/L (ref 19–32)
Calcium: 10 mg/dL (ref 8.4–10.5)
Creatinine, Ser: 2.06 mg/dL — ABNORMAL HIGH (ref 0.40–1.50)
GFR: 40.13 mL/min — AB (ref >60.00)
GLUCOSE: 105 mg/dL — AB (ref 70–99)
POTASSIUM: 3.6 meq/L (ref 3.5–5.1)
SODIUM: 142 meq/L (ref 135–145)

## 2016-12-12 LAB — CBC WITH DIFFERENTIAL/PLATELET
BASOS ABS: 0 10*3/uL (ref 0.0–0.1)
BASOS PCT: 0.5 % (ref 0.0–3.0)
EOS ABS: 0.1 10*3/uL (ref 0.0–0.7)
Eosinophils Relative: 1.5 % (ref 0.0–5.0)
HCT: 28.9 % — ABNORMAL LOW (ref 39.0–52.0)
HEMOGLOBIN: 9.5 g/dL — AB (ref 13.0–17.0)
LYMPHS PCT: 20.7 % (ref 12.0–46.0)
Lymphs Abs: 1.5 10*3/uL (ref 0.7–4.0)
MCHC: 32.8 g/dL (ref 30.0–36.0)
MCV: 75.2 fl — ABNORMAL LOW (ref 78.0–100.0)
MONO ABS: 0.5 10*3/uL (ref 0.1–1.0)
Monocytes Relative: 6.5 % (ref 3.0–12.0)
Neutro Abs: 5.2 10*3/uL (ref 1.4–7.7)
Neutrophils Relative %: 70.8 % (ref 43.0–77.0)
Platelets: 407 10*3/uL — ABNORMAL HIGH (ref 150.0–400.0)
RBC: 3.84 Mil/uL — AB (ref 4.22–5.81)
RDW: 15.4 % (ref 11.5–15.5)
WBC: 7.4 10*3/uL (ref 4.0–10.5)

## 2016-12-12 LAB — HM DIABETES EYE EXAM

## 2016-12-12 MED ORDER — LOSARTAN POTASSIUM 100 MG PO TABS: 100.0000 mg | ORAL_TABLET | Freq: Every day | ORAL | 3 refills | Status: DC

## 2016-12-12 NOTE — Patient Instructions (Signed)
Discontinue potassium supplementation Discontinue losartan/hydrochlorothiazide Start losartan 100 mg daily  Take an iron supplement once or twice daily  Return in 4 weeks for follow-up  GI follow-up as scheduled

## 2016-12-12 NOTE — Progress Notes (Signed)
Pre visit review using our clinic review tool, if applicable. No additional management support is needed unless otherwise documented below in the visit note. 

## 2016-12-12 NOTE — Progress Notes (Signed)
Subjective:    Patient ID: Austin [name], male    DOB: 03-25-1935, 82 y.o.   MRN: [id number]  HPI   82 year old patient who is seen today following a recent hospital admission.  He was admitted with melena presume is secondary to an upper GI bleed.  He did have a gastric ulcer diagnosed.  He is scheduled for GI follow-up next month Apparently he was on dual antiplatelet therapy prior to the GI bleed and presently is on Plavix only.  No recurrent melena No iron supplementation Laboratory studies last month revealed a slight increase in creatinine Antihypertensive regimen includes hydrochlorothiazide.  He had some mild hypokalemia in the hospital course and now is on potassium supplementation.  He generally feels well  Past Medical History:  Diagnosis Date  . 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 CAROTID ARTERY STENOSIS 05/10/2010  . DIABETES MELLITUS, TYPE II 09/19/2007  . DISEASE, CEREBROVASCULAR NEC 03/05/2007  . GERD 03/13/2007  . HYPERLIPIDEMIA 03/05/2007  . HYPERTENSION 03/05/2007  . HYPOKALEMIA 11/09/2009  . KNEE PAIN, RIGHT 11/09/2009  . PROSTATE CANCER, HX OF 03/05/2007  . RENAL DISEASE, CHRONIC 02/03/2009     Social History   Social History  . Marital status: Married    Spouse name: N/A  . Number of children: N/A  . Years of education: N/A   Occupational History  . Not on file.   Social History Main Topics  . Smoking status: Former Smoker    Types: Cigarettes    Quit date: 08/22/1974  . Smokeless tobacco: Never Used  . Alcohol use No  . Drug use: No  . Sexual activity: Not on file   Other Topics Concern  . Not on file   Social History Narrative  . No narrative on file    Past Surgical History:  Procedure Laterality Date  . APPENDECTOMY  02-20-12  . CAROTID ARTERY ANGIOPLASTY Right Oct. 10, 2001  . ESOPHAGOGASTRODUODENOSCOPY (EGD) WITH PROPOFOL N/A 11/04/2016   Procedure: ESOPHAGOGASTRODUODENOSCOPY (EGD) WITH PROPOFOL;  Surgeon: Otis Brace, MD;   Location: [facility];  Service: Gastroenterology;  Laterality: N/A;  . LAPAROSCOPIC APPENDECTOMY  02/20/2012   Procedure: APPENDECTOMY LAPAROSCOPIC;  Surgeon: Stark Klein, MD;  Location: [facility];  Service: General;  Laterality: N/A;  . PROSTATE SURGERY     prostatectomy    Family History  Problem Relation Age of Onset  . Hypertension Mother   . Cancer Father   . Cancer Brother     Allergies  Allergen Reactions  . Aspirin Other (See Comments)    High doses causes stomach ulcer and bleeding    Current Outpatient Prescriptions on File Prior to Visit  Medication Sig Dispense Refill  . amlodipine-atorvastatin (CADUET) 10-20 MG tablet TAKE 1 TABLET BY MOUTH EVERY DAY 90 tablet 1  . clopidogrel (PLAVIX) 75 MG tablet Take 1 tablet (75 mg total) by mouth daily. Start taking on March 23rd, 2018 90 tablet 0  . doxazosin (CARDURA) 1 MG tablet Take 1 mg by mouth daily.    Marland Kitchen esomeprazole (NEXIUM) 40 MG capsule Take 1 capsule (40 mg total) by mouth 2 (two) times daily before a meal. Take 40 mg capsule twice daily for 4 weeks and then resume previus regimen 40 mg capsule once daily 90 capsule 3  . glipiZIDE (GLUCOTROL XL) 5 MG 24 hr tablet TAKE 1 TABLET BY MOUTH EVERY DAY 90 tablet 3  . hydrocortisone-pramoxine (ANALPRAM-HC) 2.5-1 % rectal cream USE ONCE DAILY OR AS DIRECTED 30 g 2  .  metoprolol succinate (TOPROL-XL) 25 MG 24 hr tablet Take 2 tablets (50 mg total) by mouth daily. (Patient taking differently: Take 25 mg by mouth daily. ) 90 tablet 5  . ONETOUCH DELICA LANCETS 67E MISC USE TO CHECK BLOOD SUGAR DAILY AND AS NEEDED 200 each 1  . ONETOUCH VERIO test strip USE TO CHECK BLOOD SUGAR DAILY AND AS NEEDED 100 each 6   No current facility-administered medications on file prior to visit.     BP (!) 126/50 (BP Location: Left Arm, Patient Position: Sitting, Cuff Size: Large)   Temp 98.2 F (36.8 C) (Oral)   Ht 5\' 7"  (1.702 m)   Wt 188 lb (85.3 kg)   BMI 29.44 kg/m     Review of Systems    Constitutional: Negative for appetite change, chills, fatigue and fever.  HENT: Negative for congestion, dental problem, ear pain, hearing loss, sore throat, tinnitus, trouble swallowing and voice change.   Eyes: Negative for pain, discharge and visual disturbance.  Respiratory: Negative for cough, chest tightness, wheezing and stridor.   Cardiovascular: Negative for chest pain, palpitations and leg swelling.  Gastrointestinal: Negative for abdominal distention, abdominal pain, blood in stool, constipation, diarrhea, nausea and vomiting.  Genitourinary: Negative for difficulty urinating, discharge, flank pain, genital sores, hematuria and urgency.  Musculoskeletal: Negative for arthralgias, back pain, gait problem, joint swelling, myalgias and neck stiffness.  Skin: Negative for rash.  Neurological: Negative for dizziness, syncope, speech difficulty, weakness, numbness and headaches.  Hematological: Negative for adenopathy. Does not bruise/bleed easily.  Psychiatric/Behavioral: Negative for behavioral problems and dysphoric mood. The patient is not nervous/anxious.        Objective:   Physical Exam  Constitutional: He is oriented to person, place, and time. He appears well-developed.  Blood pressure 120/60  HENT:  Head: Normocephalic.  Right Ear: External ear normal.  Left Ear: External ear normal.  Eyes: Conjunctivae and EOM are normal.  Neck: Normal range of motion.  Cardiovascular: Normal rate.   Murmur heard. Pulmonary/Chest: Breath sounds normal.  Abdominal: Bowel sounds are normal.  Musculoskeletal: Normal range of motion. He exhibits no edema or tenderness.  Neurological: He is alert and oriented to person, place, and time.  Psychiatric: He has a normal mood and affect. His behavior is normal.          Assessment & Plan:   Upper GI bleed Chronic blood loss anemia.  Will place on iron supplementation History of worsening renal insufficiency and hypokalemia.  Will  discontinue hydrochlorothiazide and potassium supplementation.  Recheck electrolytes Recheck CBC  Follow-up one month  [place]

## 2016-12-12 NOTE — Telephone Encounter (Signed)
Pt seen today in [place].  Asked to return in 1 month.  Pt gets 90 day supply of medication.  Filled once.

## 2016-12-13 ENCOUNTER — Encounter: Payer: Self-pay | Admitting: Family Medicine

## 2016-12-17 ENCOUNTER — Other Ambulatory Visit: Payer: Self-pay | Admitting: Internal Medicine

## 2016-12-19 ENCOUNTER — Other Ambulatory Visit: Payer: Self-pay | Admitting: Internal Medicine

## 2016-12-19 ENCOUNTER — Encounter: Payer: Self-pay | Admitting: Family Medicine

## 2016-12-20 ENCOUNTER — Encounter: Payer: Self-pay | Admitting: Family Medicine

## 2017-01-06 ENCOUNTER — Other Ambulatory Visit: Payer: Self-pay | Admitting: Internal Medicine

## 2017-01-09 ENCOUNTER — Ambulatory Visit (INDEPENDENT_AMBULATORY_CARE_PROVIDER_SITE_OTHER): Payer: Medicare Other | Admitting: Internal Medicine

## 2017-01-09 ENCOUNTER — Encounter: Payer: Self-pay | Admitting: Internal Medicine

## 2017-01-09 VITALS — BP 138/58 | HR 74 | Temp 98.3°F | Ht 67.0 in | Wt 193.2 lb

## 2017-01-09 DIAGNOSIS — E1122 Type 2 diabetes mellitus with diabetic chronic kidney disease: Secondary | ICD-10-CM | POA: Diagnosis not present

## 2017-01-09 DIAGNOSIS — N189 Chronic kidney disease, unspecified: Secondary | ICD-10-CM | POA: Diagnosis not present

## 2017-01-09 DIAGNOSIS — E0821 Diabetes mellitus due to underlying condition with diabetic nephropathy: Secondary | ICD-10-CM

## 2017-01-09 DIAGNOSIS — D500 Iron deficiency anemia secondary to blood loss (chronic): Secondary | ICD-10-CM

## 2017-01-09 DIAGNOSIS — I10 Essential (primary) hypertension: Secondary | ICD-10-CM

## 2017-01-09 LAB — BASIC METABOLIC PANEL
BUN: 21 mg/dL (ref 6–23)
CO2: 29 mEq/L (ref 19–32)
CREATININE: 1.8 mg/dL — AB (ref 0.40–1.50)
Calcium: 9.2 mg/dL (ref 8.4–10.5)
Chloride: 104 mEq/L (ref 96–112)
GFR: 46.88 mL/min — ABNORMAL LOW (ref >60.00)
Glucose, Bld: 101 mg/dL — ABNORMAL HIGH (ref 70–99)
Potassium: 3.3 mEq/L — ABNORMAL LOW (ref 3.5–5.1)
SODIUM: 141 meq/L (ref 135–145)

## 2017-01-09 LAB — CBC WITH DIFFERENTIAL/PLATELET
Basophils Absolute: 0 10*3/uL (ref 0.0–0.1)
Basophils Relative: 0.6 % (ref 0.0–3.0)
EOS PCT: 1.9 % (ref 0.0–5.0)
Eosinophils Absolute: 0.1 10*3/uL (ref 0.0–0.7)
HCT: 28.6 % — ABNORMAL LOW (ref 39.0–52.0)
Hemoglobin: 9.4 g/dL — ABNORMAL LOW (ref 13.0–17.0)
LYMPHS ABS: 1.3 10*3/uL (ref 0.7–4.0)
Lymphocytes Relative: 19.9 % (ref 12.0–46.0)
MCHC: 32.9 g/dL (ref 30.0–36.0)
MCV: 72.2 fl — AB (ref 78.0–100.0)
MONO ABS: 0.4 10*3/uL (ref 0.1–1.0)
Monocytes Relative: 6.3 % (ref 3.0–12.0)
NEUTROS ABS: 4.7 10*3/uL (ref 1.4–7.7)
NEUTROS PCT: 71.3 % (ref 43.0–77.0)
PLATELETS: 442 10*3/uL — AB (ref 150.0–400.0)
RBC: 3.95 Mil/uL — AB (ref 4.22–5.81)
RDW: 16.9 % — ABNORMAL HIGH (ref 11.5–15.5)
WBC: 6.6 10*3/uL (ref 4.0–10.5)

## 2017-01-09 LAB — HEMOGLOBIN A1C: HEMOGLOBIN A1C: 6.8 % — AB (ref 4.6–6.5)

## 2017-01-09 NOTE — Patient Instructions (Signed)
Limit your sodium (Salt) intake  Please check your blood pressure on a regular basis.  If it is consistently greater than 150/90, please make an office appointment.     It is important that you exercise regularly, at least 20 minutes 3 to 4 times per week.  If you develop chest pain or shortness of breath seek  medical attention. 

## 2017-01-09 NOTE — Progress Notes (Signed)
Subjective:    Patient ID: Austin [name], male    DOB: 01/28/1935, 82 y.o.   MRN: [id number]  HPI  82 year old patient who is seen today in follow-up. He has a history of upper GI bleeding secondary to GU.  He is scheduled for follow-up EGD on June 1. He feels well today. He has hypertension, diabetes and diabetic nephropathy.  Last visit, hydrochlorothiazide and potassium supplementation discontinued He states he no longer takes an iron supplement  Past Medical History:  Diagnosis Date  . 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 CAROTID ARTERY STENOSIS 05/10/2010  . DIABETES MELLITUS, TYPE II 09/19/2007  . DISEASE, CEREBROVASCULAR NEC 03/05/2007  . GERD 03/13/2007  . HYPERLIPIDEMIA 03/05/2007  . HYPERTENSION 03/05/2007  . HYPOKALEMIA 11/09/2009  . KNEE PAIN, RIGHT 11/09/2009  . PROSTATE CANCER, HX OF 03/05/2007  . RENAL DISEASE, CHRONIC 02/03/2009     Social History   Social History  . Marital status: Married    Spouse name: N/A  . Number of children: N/A  . Years of education: N/A   Occupational History  . Not on file.   Social History Main Topics  . Smoking status: Former Smoker    Types: Cigarettes    Quit date: 08/22/1974  . Smokeless tobacco: Never Used  . Alcohol use No  . Drug use: No  . Sexual activity: Not on file   Other Topics Concern  . Not on file   Social History Narrative  . No narrative on file    Past Surgical History:  Procedure Laterality Date  . APPENDECTOMY  02-20-12  . CAROTID ARTERY ANGIOPLASTY Right Oct. 10, 2001  . ESOPHAGOGASTRODUODENOSCOPY (EGD) WITH PROPOFOL N/A 11/04/2016   Procedure: ESOPHAGOGASTRODUODENOSCOPY (EGD) WITH PROPOFOL;  Surgeon: Otis Brace, MD;  Location: [facility] [place];  Service: Gastroenterology;  Laterality: N/A;  . LAPAROSCOPIC APPENDECTOMY  02/20/2012   Procedure: APPENDECTOMY LAPAROSCOPIC;  Surgeon: Stark Klein, MD;  Location: [facility];  Service: General;  Laterality: N/A;  . PROSTATE SURGERY     prostatectomy     Family History  Problem Relation Age of Onset  . Hypertension Mother   . Cancer Father   . Cancer Brother     Allergies  Allergen Reactions  . Aspirin Other (See Comments)    High doses causes stomach ulcer and bleeding    Current Outpatient Prescriptions on File Prior to Visit  Medication Sig Dispense Refill  . amlodipine-atorvastatin (CADUET) 10-20 MG tablet TAKE 1 TABLET BY MOUTH EVERY DAY 90 tablet 1  . clopidogrel (PLAVIX) 75 MG tablet Take 1 tablet (75 mg total) by mouth daily. Start taking on March 23rd, 2018 90 tablet 0  . clopidogrel (PLAVIX) 75 MG tablet TAKE 1 TABLET (75 MG TOTAL) BY MOUTH DAILY. 90 tablet 0  . doxazosin (CARDURA) 1 MG tablet Take 1 mg by mouth daily.    Marland Kitchen esomeprazole (NEXIUM) 40 MG capsule Take 1 capsule (40 mg total) by mouth 2 (two) times daily before a meal. Take 40 mg capsule twice daily for 4 weeks and then resume previus regimen 40 mg capsule once daily 90 capsule 3  . glipiZIDE (GLUCOTROL XL) 5 MG 24 hr tablet TAKE 1 TABLET BY MOUTH EVERY DAY 90 tablet 3  . hydrocortisone-pramoxine (ANALPRAM-HC) 2.5-1 % rectal cream USE ONCE DAILY OR AS DIRECTED 30 g 2  . IRON PO FOR ANEMIA/GI BLEED    . losartan (COZAAR) 100 MG tablet Take 1 tablet (100 mg total) by mouth daily. 90 tablet 3  .  metoprolol succinate (TOPROL-XL) 25 MG 24 hr tablet Take 2 tablets (50 mg total) by mouth daily. (Patient taking differently: Take 25 mg by mouth daily. ) 90 tablet 5  . ONETOUCH DELICA LANCETS 16L MISC USE TO CHECK BLOOD SUGAR DAILY AND AS NEEDED 200 each 1  . ONETOUCH VERIO test strip USE TO CHECK BLOOD SUGAR DAILY AND AS NEEDED 100 each 6   No current facility-administered medications on file prior to visit.     BP (!) 138/58 (BP Location: Left Arm, Patient Position: Sitting, Cuff Size: Normal)   Pulse 74   Temp 98.3 F (36.8 C) (Oral)   Ht 5\' 7"  (1.702 m)   Wt 193 lb 3.2 oz (87.6 kg)   SpO2 98%   BMI 30.26 kg/m     Review of Systems  Constitutional:  Negative for appetite change, chills, fatigue and fever.  HENT: Negative for congestion, dental problem, ear pain, hearing loss, sore throat, tinnitus, trouble swallowing and voice change.   Eyes: Negative for pain, discharge and visual disturbance.  Respiratory: Negative for cough, chest tightness, wheezing and stridor.   Cardiovascular: Negative for chest pain, palpitations and leg swelling.  Gastrointestinal: Negative for abdominal distention, abdominal pain, blood in stool, constipation, diarrhea, nausea and vomiting.  Genitourinary: Negative for difficulty urinating, discharge, flank pain, genital sores, hematuria and urgency.  Musculoskeletal: Negative for arthralgias, back pain, gait problem, joint swelling, myalgias and neck stiffness.  Skin: Negative for rash.  Neurological: Negative for dizziness, syncope, speech difficulty, weakness, numbness and headaches.  Hematological: Negative for adenopathy. Does not bruise/bleed easily.  Psychiatric/Behavioral: Negative for behavioral problems and dysphoric mood. The patient is not nervous/anxious.        Objective:   Physical Exam  Constitutional: He is oriented to person, place, and time. He appears well-developed.  Blood pressure 130/60  HENT:  Head: Normocephalic.  Right Ear: External ear normal.  Left Ear: External ear normal.  Eyes: Conjunctivae and EOM are normal.  Neck: Normal range of motion.  Cardiovascular: Normal rate and normal heart sounds.   Pulmonary/Chest: Breath sounds normal.  Abdominal: Bowel sounds are normal. He exhibits no distension. There is no tenderness. There is no rebound.  Musculoskeletal: Normal range of motion. He exhibits no edema or tenderness.  Neurological: He is alert and oriented to person, place, and time.  Psychiatric: He has a normal mood and affect. His behavior is normal.          Assessment & Plan:   History of upper GI bleeding secondary to gastric ulcer.  Follow-up EGD as  scheduled.  Check CBC Essential hypertension, stable Diabetes mellitus.  Check hemoglobin A1c Chronic kidney disease  Follow-up 3 months No change in medical regimen   Nyoka Cowden

## 2017-01-11 ENCOUNTER — Encounter: Payer: Self-pay | Admitting: Family Medicine

## 2017-01-11 LAB — HM DIABETES EYE EXAM

## 2017-01-27 NOTE — Addendum Note (Signed)
Addendum  created 01/27/17 1059 by Lyn Hollingshead, MD   Sign clinical note

## 2017-04-11 ENCOUNTER — Ambulatory Visit (INDEPENDENT_AMBULATORY_CARE_PROVIDER_SITE_OTHER): Payer: Medicare Other | Admitting: Internal Medicine

## 2017-04-11 ENCOUNTER — Encounter: Payer: Self-pay | Admitting: Internal Medicine

## 2017-04-11 VITALS — BP 130/58 | HR 70 | Temp 98.2°F | Ht 67.0 in | Wt 193.0 lb

## 2017-04-11 DIAGNOSIS — E0821 Diabetes mellitus due to underlying condition with diabetic nephropathy: Secondary | ICD-10-CM | POA: Diagnosis not present

## 2017-04-11 DIAGNOSIS — I10 Essential (primary) hypertension: Secondary | ICD-10-CM | POA: Diagnosis not present

## 2017-04-11 DIAGNOSIS — E119 Type 2 diabetes mellitus without complications: Secondary | ICD-10-CM

## 2017-04-11 DIAGNOSIS — E785 Hyperlipidemia, unspecified: Secondary | ICD-10-CM

## 2017-04-11 LAB — POCT GLYCOSYLATED HEMOGLOBIN (HGB A1C): HEMOGLOBIN A1C: 6.8

## 2017-04-11 NOTE — Patient Instructions (Addendum)
Limit your sodium (Salt) intake   Please check your hemoglobin A1c every 3 months    It is important that you exercise regularly, at least 20 minutes 3 to 4 times per week.  If you develop chest pain or shortness of breath seek  medical attention.  Consider evaluation for obstructive sleep apnea   Sleep Apnea Sleep apnea is a condition in which breathing pauses or becomes shallow during sleep. Episodes of sleep apnea usually last 10 seconds or longer, and they may occur as many as 20 times an hour. Sleep apnea disrupts your sleep and keeps your body from getting the rest that it needs. This condition can increase your risk of certain health problems, including:  Heart attack.  Stroke.  Obesity.  Diabetes.  Heart failure.  Irregular heartbeat.  There are three kinds of sleep apnea:  Obstructive sleep apnea. This kind is caused by a blocked or collapsed airway.  Central sleep apnea. This kind happens when the part of the brain that controls breathing does not send the correct signals to the muscles that control breathing.  Mixed sleep apnea. This is a combination of obstructive and central sleep apnea.  What are the causes? The most common cause of this condition is a collapsed or blocked airway. An airway can collapse or become blocked if:  Your throat muscles are abnormally relaxed.  Your tongue and tonsils are larger than normal.  You are overweight.  Your airway is smaller than normal.  What increases the risk? This condition is more likely to develop in people who:  Are overweight.  Smoke.  Have a smaller than normal airway.  Are elderly.  Are male.  Drink alcohol.  Take sedatives or tranquilizers.  Have a family history of sleep apnea.  What are the signs or symptoms? Symptoms of this condition include:  Trouble staying asleep.  Daytime sleepiness and tiredness.  Irritability.  Loud snoring.  Morning headaches.  Trouble  concentrating.  Forgetfulness.  Decreased interest in sex.  Unexplained sleepiness.  Mood swings.  Personality changes.  Feelings of depression.  Waking up often during the night to urinate.  Dry mouth.  Sore throat.  How is this diagnosed? This condition may be diagnosed with:  A medical history.  A physical exam.  A series of tests that are done while you are sleeping (sleep study). These tests are usually done in a sleep lab, but they may also be done at home.  How is this treated? Treatment for this condition aims to restore normal breathing and to ease symptoms during sleep. It may involve managing health issues that can affect breathing, such as high blood pressure or obesity. Treatment may include:  Sleeping on your side.  Using a decongestant if you have nasal congestion.  Avoiding the use of depressants, including alcohol, sedatives, and narcotics.  Losing weight if you are overweight.  Making changes to your diet.  Quitting smoking.  Using a device to open your airway while you sleep, such as: ? An oral appliance. This is a custom-made mouthpiece that shifts your lower jaw forward. ? A continuous positive airway pressure (CPAP) device. This device delivers oxygen to your airway through a mask. ? A nasal expiratory positive airway pressure (EPAP) device. This device has valves that you put into each nostril. ? A bi-level positive airway pressure (BPAP) device. This device delivers oxygen to your airway through a mask.  Surgery if other treatments do not work. During surgery, excess tissue is removed to  create a wider airway.  It is important to get treatment for sleep apnea. Without treatment, this condition can lead to:  High blood pressure.  Coronary artery disease.  (Men) An inability to achieve or maintain an erection (impotence).  Reduced thinking abilities.  Follow these instructions at home:  Make any lifestyle changes that your health  care provider recommends.  Eat a healthy, well-balanced diet.  Take over-the-counter and prescription medicines only as told by your health care provider.  Avoid using depressants, including alcohol, sedatives, and narcotics.  Take steps to lose weight if you are overweight.  If you were given a device to open your airway while you sleep, use it only as told by your health care provider.  Do not use any tobacco products, such as cigarettes, chewing tobacco, and e-cigarettes. If you need help quitting, ask your health care provider.  Keep all follow-up visits as told by your health care provider. This is important. Contact a health care provider if:  The device that you received to open your airway during sleep is uncomfortable or does not seem to be working.  Your symptoms do not improve.  Your symptoms get worse. Get help right away if:  You develop chest pain.  You develop shortness of breath.  You develop discomfort in your back, arms, or stomach.  You have trouble speaking.  You have weakness on one side of your body.  You have drooping in your face. These symptoms may represent a serious problem that is an emergency. Do not wait to see if the symptoms will go away. Get medical help right away. Call your local emergency services (911 in the [place] Do not drive yourself to the hospital. This information is not intended to replace advice given to you by your health care provider. Make sure you discuss any questions you have with your health care provider. Document Released: 07/29/2002 Document Revised: 04/03/2016 Document Reviewed: 05/18/2015 Elsevier Interactive Patient Education  [employer].
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
## 2017-05-24 ENCOUNTER — Ambulatory Visit (INDEPENDENT_AMBULATORY_CARE_PROVIDER_SITE_OTHER): Payer: Medicare Other

## 2017-05-24 DIAGNOSIS — Z23 Encounter for immunization: Secondary | ICD-10-CM

## 2017-06-07 ENCOUNTER — Other Ambulatory Visit: Payer: Self-pay | Admitting: Internal Medicine

## 2017-06-10 ENCOUNTER — Other Ambulatory Visit: Payer: Self-pay | Admitting: Internal Medicine

## 2017-06-28 ENCOUNTER — Other Ambulatory Visit: Payer: Self-pay | Admitting: Internal Medicine

## 2017-06-29 ENCOUNTER — Other Ambulatory Visit: Payer: Self-pay | Admitting: Internal Medicine

## 2017-07-12 ENCOUNTER — Ambulatory Visit: Payer: Medicare Other | Admitting: Internal Medicine

## 2017-07-15 ENCOUNTER — Other Ambulatory Visit: Payer: Self-pay | Admitting: Internal Medicine

## 2017-07-19 ENCOUNTER — Encounter: Payer: Self-pay | Admitting: Internal Medicine

## 2017-07-19 ENCOUNTER — Ambulatory Visit: Payer: Medicare Other | Admitting: Internal Medicine

## 2017-07-19 VITALS — BP 138/62 | HR 70 | Temp 98.2°F | Ht 67.0 in | Wt 195.0 lb

## 2017-07-19 DIAGNOSIS — E119 Type 2 diabetes mellitus without complications: Secondary | ICD-10-CM | POA: Diagnosis not present

## 2017-07-19 LAB — POCT GLYCOSYLATED HEMOGLOBIN (HGB A1C): Hemoglobin A1C: 6.7

## 2017-07-19 NOTE — Progress Notes (Signed)
Subjective:    Patient ID: Austin [name], male    DOB: [birth year], 82 y.o.   MRN: [id number]  HPI 82 year old patient who is seen today for follow-up of type 2 diabetes. He is doing well.  Describes some mild numbness of fingertips on both hands that has resolved.  Otherwise no concerns or complaints.  He has had a recent eye examination and is scheduled to see cardiology early next month. Last hemoglobin A1c was 6.8. He has essential hypertension.     Wt Readings from Last 3 Encounters:  07/19/17 195 lb (88.5 kg)  04/11/17 193 lb (87.5 kg)  01/09/17 193 lb 3.2 oz (87.6 kg)   Past Medical History:  Diagnosis Date  . 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 CAROTID ARTERY STENOSIS 05/10/2010  . DIABETES MELLITUS, TYPE II 09/19/2007  . DISEASE, CEREBROVASCULAR NEC 03/05/2007  . GERD 03/13/2007  . HYPERLIPIDEMIA 03/05/2007  . HYPERTENSION 03/05/2007  . HYPOKALEMIA 11/09/2009  . KNEE PAIN, RIGHT 11/09/2009  . PROSTATE CANCER, HX OF 03/05/2007  . RENAL DISEASE, CHRONIC 02/03/2009     Social History   Socioeconomic History  . Marital status: Married    Spouse name: Not on file  . Number of children: Not on file  . Years of education: Not on file  . Highest education level: Not on file  Social Needs  . Financial resource strain: Not on file  . Food insecurity - worry: Not on file  . Food insecurity - inability: Not on file  . Transportation needs - medical: Not on file  . Transportation needs - non-medical: Not on file  Occupational History  . Not on file  Tobacco Use  . Smoking status: Former Smoker    Types: Cigarettes    Last attempt to quit: 08/22/1974    Years since quitting: 42.9  . Smokeless tobacco: Never Used  Substance and Sexual Activity  . Alcohol use: No    Alcohol/week: 0.0 oz  . Drug use: No  . Sexual activity: Not on file  Other Topics Concern  . Not on file  Social History Narrative  . Not on file    Past Surgical History:  Procedure Laterality Date    . APPENDECTOMY  02-20-12  . CAROTID ARTERY ANGIOPLASTY Right Oct. 10, 2001  . ESOPHAGOGASTRODUODENOSCOPY (EGD) WITH PROPOFOL N/A 11/04/2016   Procedure: ESOPHAGOGASTRODUODENOSCOPY (EGD) WITH PROPOFOL;  Surgeon: Otis Brace, MD;  Location: [facility];  Service: Gastroenterology;  Laterality: N/A;  . LAPAROSCOPIC APPENDECTOMY  02/20/2012   Procedure: APPENDECTOMY LAPAROSCOPIC;  Surgeon: Stark Klein, MD;  Location: [facility];  Service: General;  Laterality: N/A;  . PROSTATE SURGERY     prostatectomy    Family History  Problem Relation Age of Onset  . Hypertension Mother   . Cancer Father   . Cancer Brother     Allergies  Allergen Reactions  . Aspirin Other (See Comments)    High doses causes stomach ulcer and bleeding    Current Outpatient Medications on File Prior to Visit  Medication Sig Dispense Refill  . amlodipine-atorvastatin (CADUET) 10-20 MG tablet TAKE 1 TABLET BY MOUTH EVERY DAY 90 tablet 1  . clopidogrel (PLAVIX) 75 MG tablet Take 1 tablet (75 mg total) by mouth daily. Start taking on March 23rd, 2018 90 tablet 0  . clopidogrel (PLAVIX) 75 MG tablet TAKE 1 TABLET EVERY DAY 90 tablet 0  . doxazosin (CARDURA) 1 MG tablet Take 2 mg by mouth daily.     Marland Kitchen esomeprazole (  NEXIUM) 40 MG capsule Take 1 capsule (40 mg total) by mouth 2 (two) times daily before a meal. Take 40 mg capsule twice daily for 4 weeks and then resume previus regimen 40 mg capsule once daily 90 capsule 3  . glipiZIDE (GLUCOTROL XL) 5 MG 24 hr tablet TAKE 1 TABLET BY MOUTH EVERY DAY 90 tablet 3  . hydrocortisone-pramoxine (ANALPRAM-HC) 2.5-1 % rectal cream USE ONCE DAILY OR AS DIRECTED 30 g 2  . IRON PO FOR ANEMIA/GI BLEED    . losartan (COZAAR) 100 MG tablet Take 1 tablet (100 mg total) by mouth daily. 90 tablet 3  . metoprolol succinate (TOPROL-XL) 25 MG 24 hr tablet Take 2 tablets (50 mg total) by mouth daily. (Patient taking differently: Take 25 mg by mouth daily. ) 90 tablet 5  . ONETOUCH DELICA LANCETS  16X MISC USE TO CHECK BLOOD SUGAR DAILY AND AS NEEDED 200 each 1  . ONETOUCH VERIO test strip USE TO CHECK BLOOD SUGAR DAILY AND AS NEEDED 100 each 0   No current facility-administered medications on file prior to visit.     BP 138/62 (BP Location: Left Arm, Patient Position: Sitting, Cuff Size: Normal)   Pulse 70   Temp 98.2 F (36.8 C) (Oral)   Ht 5\' 7"  (1.702 m)   Wt 195 lb (88.5 kg)   SpO2 96%   BMI 30.54 kg/m      Review of Systems  Constitutional: Negative for appetite change, chills, fatigue and fever.  HENT: Negative for congestion, dental problem, ear pain, hearing loss, sore throat, tinnitus, trouble swallowing and voice change.   Eyes: Negative for pain, discharge and visual disturbance.  Respiratory: Negative for cough, chest tightness, wheezing and stridor.   Cardiovascular: Negative for chest pain, palpitations and leg swelling.  Gastrointestinal: Negative for abdominal distention, abdominal pain, blood in stool, constipation, diarrhea, nausea and vomiting.  Genitourinary: Negative for difficulty urinating, discharge, flank pain, genital sores, hematuria and urgency.  Musculoskeletal: Negative for arthralgias, back pain, gait problem, joint swelling, myalgias and neck stiffness.  Skin: Negative for rash.  Neurological: Positive for numbness. Negative for dizziness, syncope, speech difficulty, weakness and headaches.  Hematological: Negative for adenopathy. Does not bruise/bleed easily.  Psychiatric/Behavioral: Negative for behavioral problems and dysphoric mood. The patient is not nervous/anxious.        Objective:   Physical Exam  Constitutional: He is oriented to person, place, and time. He appears well-developed.  Repeat blood pressure 124/60  HENT:  Head: Normocephalic.  Right Ear: External ear normal.  Left Ear: External ear normal.  Eyes: Conjunctivae and EOM are normal.  Neck: Normal range of motion.  Cardiovascular: Normal rate and normal heart  sounds.  Pulmonary/Chest: Breath sounds normal.  Abdominal: Bowel sounds are normal.  Musculoskeletal: Normal range of motion. He exhibits no edema or tenderness.  Neurological: He is alert and oriented to person, place, and time.  Psychiatric: He has a normal mood and affect. His behavior is normal.          Assessment & Plan:   Diabetes mellitus.  Will review a hemoglobin A1c  essential hypertension stable Dyslipidemia.  Continue atorvastatin  No change in medical regimen follow-up 4 months  KWIATKOWSKI,PETER Pilar Plate

## 2017-07-19 NOTE — Patient Instructions (Signed)
Limit your sodium (Salt) intake   Please check your hemoglobin A1c every 3 months    It is important that you exercise regularly, at least 20 minutes 3 to 4 times per week.  If you develop chest pain or shortness of breath seek  medical attention.  He appears well, in no apparent distress.  Alert and oriented times three, pleasant and cooperative. Vital signs are as documented in vital signs section.

## 2017-08-29 ENCOUNTER — Other Ambulatory Visit: Payer: Self-pay | Admitting: Internal Medicine

## 2017-08-30 NOTE — Telephone Encounter (Signed)
Sent to the pharmacy by e-scribe for 90 days.  Pt has upcoming follow up on 11/20/17.

## 2017-09-07 ENCOUNTER — Other Ambulatory Visit: Payer: Self-pay | Admitting: Internal Medicine

## 2017-09-19 ENCOUNTER — Other Ambulatory Visit: Payer: Self-pay | Admitting: Internal Medicine

## 2017-10-03 ENCOUNTER — Ambulatory Visit: Payer: Medicare Other | Admitting: Internal Medicine

## 2017-10-03 ENCOUNTER — Encounter: Payer: Self-pay | Admitting: Internal Medicine

## 2017-10-03 VITALS — BP 140/68 | HR 78 | Temp 98.8°F | Ht 67.0 in | Wt 194.0 lb

## 2017-10-03 DIAGNOSIS — K644 Residual hemorrhoidal skin tags: Secondary | ICD-10-CM | POA: Diagnosis not present

## 2017-10-03 DIAGNOSIS — K625 Hemorrhage of anus and rectum: Secondary | ICD-10-CM

## 2017-10-03 MED ORDER — HYDROCORTISONE 2.5 % RE CREA: 1.0000 "application " | TOPICAL_CREAM | Freq: Two times a day (BID) | RECTAL | 0 refills | Status: DC

## 2017-10-03 MED ORDER — TRIAMCINOLONE ACETONIDE 0.1 % EX CREA: 1.0000 "application " | TOPICAL_CREAM | Freq: Two times a day (BID) | CUTANEOUS | 0 refills | Status: DC

## 2017-10-03 NOTE — Progress Notes (Signed)
Subjective:    Patient ID: Austin [name], male    DOB: Jan 17, 1935, 83 y.o.   MRN: [id number]  HPI 83 year old patient who has a prior history of external hemorrhoids who presents with a 6-day history of scanty bright red blood per rectum.  He discontinued Plavix therapy about 6 days ago and bleeding has largely resolved.  He has had a prior history of upper endoscopy due to gastric polyps.  He has essential hypertension.  He has also type 2 diabetes.  Past Medical History:  Diagnosis Date  . 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 CAROTID ARTERY STENOSIS 05/10/2010  . DIABETES MELLITUS, TYPE II 09/19/2007  . DISEASE, CEREBROVASCULAR NEC 03/05/2007  . GERD 03/13/2007  . HYPERLIPIDEMIA 03/05/2007  . HYPERTENSION 03/05/2007  . HYPOKALEMIA 11/09/2009  . KNEE PAIN, RIGHT 11/09/2009  . PROSTATE CANCER, HX OF 03/05/2007  . RENAL DISEASE, CHRONIC 02/03/2009     Social History   Socioeconomic History  . Marital status: Married    Spouse name: Not on file  . Number of children: Not on file  . Years of education: Not on file  . Highest education level: Not on file  Social Needs  . Financial resource strain: Not on file  . Food insecurity - worry: Not on file  . Food insecurity - inability: Not on file  . Transportation needs - medical: Not on file  . Transportation needs - non-medical: Not on file  Occupational History  . Not on file  Tobacco Use  . Smoking status: Former Smoker    Types: Cigarettes    Last attempt to quit: 08/22/1974    Years since quitting: 43.1  . Smokeless tobacco: Never Used  Substance and Sexual Activity  . Alcohol use: No    Alcohol/week: 0.0 oz  . Drug use: No  . Sexual activity: Not on file  Other Topics Concern  . Not on file  Social History Narrative  . Not on file    Past Surgical History:  Procedure Laterality Date  . APPENDECTOMY  02-20-12  . CAROTID ARTERY ANGIOPLASTY Right Oct. 10, 2001  . ESOPHAGOGASTRODUODENOSCOPY (EGD) WITH PROPOFOL N/A  11/04/2016   Procedure: ESOPHAGOGASTRODUODENOSCOPY (EGD) WITH PROPOFOL;  Surgeon: Otis Brace, MD;  Location: [facility];  Service: Gastroenterology;  Laterality: N/A;  . LAPAROSCOPIC APPENDECTOMY  02/20/2012   Procedure: APPENDECTOMY LAPAROSCOPIC;  Surgeon: Stark Klein, MD;  Location: [facility];  Service: General;  Laterality: N/A;  . PROSTATE SURGERY     prostatectomy    Family History  Problem Relation Age of Onset  . Hypertension Mother   . Cancer Father   . Cancer Brother     Allergies  Allergen Reactions  . Aspirin Other (See Comments)    High doses causes stomach ulcer and bleeding    Current Outpatient Medications on File Prior to Visit  Medication Sig Dispense Refill  . amlodipine-atorvastatin (CADUET) 10-20 MG tablet TAKE 1 TABLET BY MOUTH EVERY DAY 90 tablet 1  . clopidogrel (PLAVIX) 75 MG tablet Take 1 tablet (75 mg total) by mouth daily. Start taking on March 23rd, 2018 90 tablet 0  . clopidogrel (PLAVIX) 75 MG tablet TAKE 1 TABLET EVERY DAY 90 tablet 0  . doxazosin (CARDURA) 1 MG tablet Take 2 mg by mouth daily.     Marland Kitchen esomeprazole (NEXIUM) 40 MG capsule Take 1 capsule (40 mg total) by mouth daily. 90 capsule 0  . glipiZIDE (GLUCOTROL XL) 5 MG 24 hr tablet TAKE 1 TABLET BY  MOUTH EVERY DAY 90 tablet 3  . hydrocortisone-pramoxine (ANALPRAM-HC) 2.5-1 % rectal cream USE ONCE DAILY OR AS DIRECTED 30 g 2  . losartan (COZAAR) 100 MG tablet Take 1 tablet (100 mg total) by mouth daily. 90 tablet 3  . metoprolol succinate (TOPROL-XL) 25 MG 24 hr tablet TAKE 2 TABLETS (50 MG TOTAL) BY MOUTH DAILY. 90 tablet 4  . ONETOUCH DELICA LANCETS 62X MISC USE TO CHECK BLOOD SUGAR DAILY AND AS NEEDED 200 each 1  . ONETOUCH VERIO test strip USE TO CHECK BLOOD SUGAR DAILY AND AS NEEDED 100 each 0  . doxazosin (CARDURA) 2 MG tablet TAKE 1 TABLET BY MOUTH EVERYDAY AT BEDTIME  3  . IRON PO FOR ANEMIA/GI BLEED    . nystatin cream (MYCOSTATIN) APPLY DIRECTLY ON DENTURE TWICE DAILY  1   No  current facility-administered medications on file prior to visit.     BP 140/68 (BP Location: Left Arm, Patient Position: Sitting, Cuff Size: Normal)   Pulse 78   Temp 98.8 F (37.1 C) (Oral)   Ht 5\' 7"  (1.702 m)   Wt 194 lb (88 kg)   SpO2 96%   BMI 30.38 kg/m      Review of Systems  Constitutional: Negative for appetite change, chills, fatigue and fever.  HENT: Negative for congestion, dental problem, ear pain, hearing loss, sore throat, tinnitus, trouble swallowing and voice change.   Eyes: Negative for pain, discharge and visual disturbance.  Respiratory: Negative for cough, chest tightness, wheezing and stridor.   Cardiovascular: Negative for chest pain, palpitations and leg swelling.  Gastrointestinal: Positive for blood in stool. Negative for abdominal distention, abdominal pain, constipation, diarrhea, nausea and vomiting.  Genitourinary: Negative for difficulty urinating, discharge, flank pain, genital sores, hematuria and urgency.  Musculoskeletal: Negative for arthralgias, back pain, gait problem, joint swelling, myalgias and neck stiffness.  Skin: Negative for rash.  Neurological: Negative for dizziness, syncope, speech difficulty, weakness, numbness and headaches.  Hematological: Negative for adenopathy. Does not bruise/bleed easily.  Psychiatric/Behavioral: Negative for behavioral problems and dysphoric mood. The patient is not nervous/anxious.        Objective:   Physical Exam  Constitutional: He appears well-developed and well-nourished. No distress.  Blood pressure 140/68  Genitourinary: Prostate normal.  Genitourinary Comments: External hemorrhoids noted Rectal exam otherwise unremarkable          Assessment & Plan:   External hemorrhoids with episodic bright red rectal bleeding.  Will treat with Anusol HC cream.  Will resume Plavix in 2 days Essential hypertension stable Diabetes mellitus.  Follow-up next month as scheduled  Nyoka Cowden

## 2017-10-03 NOTE — Patient Instructions (Addendum)
Hemorrhoids    Hemorrhoids are swollen veins in and around the rectum or anus. Hemorrhoids can cause pain, itching, or bleeding. Most of the time, they do not cause serious problems. They usually get better with diet changes, lifestyle changes, and other home treatments.  Follow these instructions at home:  Eating and drinking  · Eat foods that have fiber, such as whole grains, beans, nuts, fruits, and vegetables. Ask your doctor about taking products that have added fiber (fiber supplements).  · Drink enough fluid to keep your pee (urine) clear or pale yellow.  For Pain and Swelling  · Take a warm-water bath (sitz bath) for 20 minutes to ease pain. Do this 3-4 times a day.  · If directed, put ice on the painful area. It may be helpful to use ice between your warm baths.  ¨ Put ice in a plastic bag.  ¨ Place a towel between your skin and the bag.  ¨ Leave the ice on for 20 minutes, 2-3 times a day.  General instructions  · Take over-the-counter and prescription medicines only as told by your doctor.  ¨ Medicated creams and medicines that are inserted into the anus (suppositories) may be used or applied as told.  · Exercise often.  · Go to the bathroom when you have the urge to poop (to have a bowel movement). Do not wait.  · Avoid pushing too hard (straining) when you poop.  · Keep the butt area dry and clean. Use wet toilet paper or moist paper towels.  · Do not sit on the toilet for a long time.  Contact a doctor if:  · You have any of these:  ¨ Pain and swelling that do not get better with treatment or medicine.  ¨ Bleeding that will not stop.  ¨ Trouble pooping or you cannot poop.  ¨ Pain or swelling outside the area of the hemorrhoids.  This information is not intended to replace advice given to you by your health care provider. Make sure you discuss any questions you have with your health care provider.  Document Released: 05/17/2008 Document Revised: 01/14/2016 Document Reviewed: 04/22/2015  Elsevier  Interactive Patient Education © 2018 [employer].   

## 2017-10-06 ENCOUNTER — Other Ambulatory Visit: Payer: Self-pay

## 2017-10-06 MED ORDER — AMLODIPINE-ATORVASTATIN 10-20 MG PO TABS: 1.0000 | ORAL_TABLET | Freq: Every day | ORAL | 1 refills | Status: DC

## 2017-10-06 NOTE — Telephone Encounter (Signed)
Medication sent I electronically.

## 2017-10-11 ENCOUNTER — Telehealth: Payer: Self-pay | Admitting: *Deleted

## 2017-10-11 NOTE — Telephone Encounter (Signed)
Requesting alternative for Amlodipine-atorvastatin 10-20 mg, medication is $128, need new Rx's for single entity products. Amlodipine 10 mg and  Atorvastatin 20 mg, asking for 90 day supply of both medications and number of refills.

## 2017-10-12 ENCOUNTER — Other Ambulatory Visit: Payer: Self-pay | Admitting: *Deleted

## 2017-10-12 MED ORDER — AMLODIPINE BESYLATE 10 MG PO TABS: 10.0000 mg | ORAL_TABLET | Freq: Every day | ORAL | 3 refills | Status: DC

## 2017-10-12 MED ORDER — ATORVASTATIN CALCIUM 20 MG PO TABS: 20.0000 mg | ORAL_TABLET | Freq: Every day | ORAL | 3 refills | Status: DC

## 2017-10-12 NOTE — Telephone Encounter (Signed)
Refill x2; okay #90 each medication

## 2017-10-12 NOTE — Telephone Encounter (Signed)
Rx's sent to pharmacy.  

## 2017-10-15 ENCOUNTER — Other Ambulatory Visit: Payer: Self-pay | Admitting: Internal Medicine

## 2017-10-23 ENCOUNTER — Other Ambulatory Visit: Payer: Self-pay

## 2017-11-15 ENCOUNTER — Telehealth: Payer: Self-pay | Admitting: Internal Medicine

## 2017-11-15 NOTE — Telephone Encounter (Signed)
Copied from [facility] [id number]. Topic: Quick Communication - See Telephone Encounter >> Nov 15, 2017  1:37 PM Cleaster Corin, NT wrote: CRM for notification. See Telephone encounter for: 11/15/17.  Pt. Calling to see if he needs to keep appt. He has scheduled for Monday 11/20/17 Pt. Stated its supposed to be a 4 month follow up which means June. Pt. Would just like for someone to give him a call back if he needs to keep appt. Or not

## 2017-11-16 ENCOUNTER — Ambulatory Visit (HOSPITAL_COMMUNITY)
Admission: RE | Admit: 2017-11-16 | Discharge: 2017-11-16 | Disposition: A | Discharge status: Home / Self Care | Payer: Medicare Other | Source: Ambulatory Visit | Attending: Family | Admitting: Family

## 2017-11-16 ENCOUNTER — Other Ambulatory Visit: Payer: Self-pay

## 2017-11-16 ENCOUNTER — Ambulatory Visit (INDEPENDENT_AMBULATORY_CARE_PROVIDER_SITE_OTHER): Payer: Medicare Other | Admitting: Family

## 2017-11-16 ENCOUNTER — Encounter: Payer: Self-pay | Admitting: Family

## 2017-11-16 VITALS — BP 152/74 | HR 56 | Temp 98.1°F | Resp 18 | Ht 67.5 in | Wt 198.0 lb

## 2017-11-16 DIAGNOSIS — I10 Essential (primary) hypertension: Secondary | ICD-10-CM

## 2017-11-16 DIAGNOSIS — I6523 Occlusion and stenosis of bilateral carotid arteries: Secondary | ICD-10-CM | POA: Diagnosis not present

## 2017-11-16 DIAGNOSIS — I6521 Occlusion and stenosis of right carotid artery: Secondary | ICD-10-CM

## 2017-11-16 DIAGNOSIS — Z9889 Other specified postprocedural states: Secondary | ICD-10-CM

## 2017-11-16 NOTE — Progress Notes (Signed)
Chief Complaint: Follow up Extracranial Carotid Artery Stenosis   History of Present Illness  Austin [name] is a 83 y.o. male patient of Dr. Oneida Alar who is status post a right carotid intracranial angioplasty in October 2001 by Dr. Estanislado Pandy. He returns today for routine surveillance.  He had a right occular event that he states was not a stroke, may have been a TIA but denies monocular loss of vision. He denies any history of unilateral facial drooping, hemiplegia or hemiparesis, or receptive or expressive aphasia.  He denies claudication symptoms in legs with walking.  Pt states his blood pressure at home is 097-353 systolic, he is working with Dr. Terrence Dupont to improve this.    Pt Diabetic: Yes, 6.7 A1C on 07-19-17, in control Pt smoker: former smoker, quit in 1976  Pt meds include: Statin : Yes ASA: no, states bleeding from his stomach, had polyps which were removed in 2018 Other anticoagulants/antiplatelets: Plavix    Past Medical History:  Diagnosis Date  . 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 CAROTID ARTERY STENOSIS 05/10/2010  . DIABETES MELLITUS, TYPE II 09/19/2007  . DISEASE, CEREBROVASCULAR NEC 03/05/2007  . GERD 03/13/2007  . HYPERLIPIDEMIA 03/05/2007  . HYPERTENSION 03/05/2007  . HYPOKALEMIA 11/09/2009  . KNEE PAIN, RIGHT 11/09/2009  . PROSTATE CANCER, HX OF 03/05/2007  . RENAL DISEASE, CHRONIC 02/03/2009    Social History Social History   Tobacco Use  . Smoking status: Former Smoker    Types: Cigarettes    Last attempt to quit: 08/22/1974    Years since quitting: 43.2  . Smokeless tobacco: Never Used  Substance Use Topics  . Alcohol use: No    Alcohol/week: 0.0 oz  . Drug use: No    Family History Family History  Problem Relation Age of Onset  . Hypertension Mother   . Cancer Father   . Cancer Brother     Surgical History Past Surgical History:  Procedure Laterality Date  . APPENDECTOMY  02-20-12  . CAROTID ARTERY ANGIOPLASTY Right Oct. 10,  2001  . ESOPHAGOGASTRODUODENOSCOPY (EGD) WITH PROPOFOL N/A 11/04/2016   Procedure: ESOPHAGOGASTRODUODENOSCOPY (EGD) WITH PROPOFOL;  Surgeon: Otis Brace, MD;  Location: [facility];  Service: Gastroenterology;  Laterality: N/A;  . LAPAROSCOPIC APPENDECTOMY  02/20/2012   Procedure: APPENDECTOMY LAPAROSCOPIC;  Surgeon: Stark Klein, MD;  Location: [facility];  Service: General;  Laterality: N/A;  . PROSTATE SURGERY     prostatectomy    Allergies  Allergen Reactions  . Aspirin Other (See Comments)    High doses causes stomach ulcer and bleeding    Current Outpatient Medications  Medication Sig Dispense Refill  . amLODipine (NORVASC) 10 MG tablet Take 1 tablet (10 mg total) by mouth daily. 90 tablet 3  . atorvastatin (LIPITOR) 20 MG tablet Take 1 tablet (20 mg total) by mouth daily. 90 tablet 3  . clopidogrel (PLAVIX) 75 MG tablet Take 1 tablet (75 mg total) by mouth daily. Start taking on March 23rd, 2018 90 tablet 0  . doxazosin (CARDURA) 1 MG tablet Take 2 mg by mouth daily.     Marland Kitchen doxazosin (CARDURA) 2 MG tablet TAKE 1 TABLET BY MOUTH EVERYDAY AT BEDTIME  3  . esomeprazole (NEXIUM) 40 MG capsule Take 1 capsule (40 mg total) by mouth daily. 90 capsule 0  . glipiZIDE (GLUCOTROL XL) 5 MG 24 hr tablet TAKE 1 TABLET BY MOUTH EVERY DAY 90 tablet 3  . hydrocortisone (ANUSOL-HC) 2.5 % rectal cream Place 1 application rectally 2 (two) times  daily. 30 g 0  . IRON PO FOR ANEMIA/GI BLEED    . losartan (COZAAR) 100 MG tablet Take 1 tablet (100 mg total) by mouth daily. 90 tablet 3  . metoprolol succinate (TOPROL-XL) 25 MG 24 hr tablet TAKE 2 TABLETS (50 MG TOTAL) BY MOUTH DAILY. 90 tablet 4  . nystatin cream (MYCOSTATIN) APPLY DIRECTLY ON DENTURE TWICE DAILY  1  . ONETOUCH DELICA LANCETS 57Q MISC USE TO CHECK BLOOD SUGAR DAILY AND AS NEEDED 200 each 1  . ONETOUCH VERIO test strip USE TO CHECK BLOOD SUGAR DAILY AND AS NEEDED 100 each 0  . triamcinolone cream (KENALOG) 0.1 % Apply 1 application  topically 2 (two) times daily. 30 g 0  . amlodipine-atorvastatin (CADUET) 10-20 MG tablet Take 1 tablet by mouth daily. (Patient not taking: Reported on 11/16/2017) 90 tablet 1   No current facility-administered medications for this visit.     Review of Systems : See HPI for pertinent positives and negatives.  Physical Examination  Vitals:   11/16/17 0835 11/16/17 0840 11/16/17 0841  BP: (!) 158/79 (!) 157/71 (!) 152/74  Pulse: (!) 56 (!) 56 (!) 56  Resp: 18    Temp: 98.1 F (36.7 C)    TempSrc: Oral    SpO2: 98%    Weight: 198 lb (89.8 kg)    Height: 5' 7.5" (1.715 m)     Body mass index is 30.55 kg/m.  General: WDWN obese male in NAD GAIT: normal HENT: Lipoma behind left ear  Eyes: PERRLA Pulmonary: Respirations are non-labored, CTAB, no rales, rhonchi, or wheezing.  Cardiac: regular rhythm, no detected murmur.  VASCULAR EXAM Carotid Bruits Left Right   Negative Negative   Abdominal aortic pulse is not palpable  Radial pulses are 2+ palpable and equal.      LE Pulses LEFT RIGHT   POPLITEAL not palpable  not palpable   POSTERIOR TIBIAL  palpable   palpable    DORSALIS PEDIS  ANTERIOR TIBIAL not palpable  not palpable     Gastrointestinal: soft, nontender, BS WNL, no r/g, no palpable masses. Musculoskeletal: No muscle atrophy/wasting. M/S 5/5 throughout, Extremities without ischemic changes. Skin: No rashes, no ulcers, no cellulitis.   Neurologic:  A&O X 3; appropriate affect, sensation is normal; speech is normal, CN 2-12 intact, pain and light touch intact in extremities, motor exam as listed above. Psychiatric: Normal thought content, mood appropriate to clinical situation.    Assessment: Austin [name] is a 83 y.o. male who who is status post right intracranial  carotid angioplasty in October 2001. He has no history of stroke, did have possible TIA in 2001 prior to the intracranial carotid angioplasty.  Pt is taking 4 mediations that lower his blood pressure and remains hypertensive. He also states that he may need to see a nephrologist due his renal function.  Last serum creatinine and eGFR results on file was 1.8 and 46.88 on 01-09-17.   I offered pt renal artery ultrasound to evaluate for stenosis as a partial basis for his hypertension; he said he wants to discuss this with Dr. Terrence Dupont first. Can schedule bilateral renal artery duplex if Dr. Terrence Dupont or pt requests this of [place].  Otherwise we will see him back in 2 years for carotid duplex.   DATA Carotid Duplex (11/16/17): Bilateral ICA stenosis remains at 1-39% Bilateral vertebral artery flow is antegrade.  Bilateral subclavian artery waveforms are normal.  No change since the exam on 11-12-15.    Plan: Follow-up in 2  years with Carotid Duplex scan.   I discussed in depth with the patient the nature of atherosclerosis, and emphasized the importance of maximal medical management including strict control of blood pressure, blood glucose, and lipid levels, obtaining regular exercise, and continued cessation of smoking.  The patient is aware that without maximal medical management the underlying atherosclerotic disease process will progress, limiting the benefit of any interventions. The patient was given information about stroke prevention and what symptoms should prompt the patient to seek immediate medical care. Thank you for allowing [place] to participate in this patient's care.  Clemon Chambers, RN, MSN, FNP-C [facility] Office: [phone]  Clinic Physician: Early  11/16/17 8:49 AM

## 2017-11-16 NOTE — Progress Notes (Signed)
Vitals:   11/16/17 0835 11/16/17 0840  BP: (!) 158/79 (!) 157/71  Pulse: (!) 56 (!) 56  Resp: 18   Temp: 98.1 F (36.7 C)   TempSrc: Oral   SpO2: 98%   Weight: 198 lb (89.8 kg)   Height: 5' 7.5" (1.715 m)

## 2017-11-16 NOTE — Patient Instructions (Signed)

## 2017-11-16 NOTE — Telephone Encounter (Signed)
Spoke to patient and he verbalized understanding for needing to keep his appointment in April.

## 2017-11-20 ENCOUNTER — Other Ambulatory Visit: Payer: Self-pay | Admitting: Internal Medicine

## 2017-11-20 ENCOUNTER — Encounter: Payer: Self-pay | Admitting: Internal Medicine

## 2017-11-20 ENCOUNTER — Ambulatory Visit: Payer: Medicare Other | Admitting: Internal Medicine

## 2017-11-20 VITALS — BP 150/78 | HR 66 | Temp 98.9°F | Wt 195.0 lb

## 2017-11-20 DIAGNOSIS — I10 Essential (primary) hypertension: Secondary | ICD-10-CM

## 2017-11-20 DIAGNOSIS — D508 Other iron deficiency anemias: Secondary | ICD-10-CM

## 2017-11-20 DIAGNOSIS — E0821 Diabetes mellitus due to underlying condition with diabetic nephropathy: Secondary | ICD-10-CM

## 2017-11-20 DIAGNOSIS — N189 Chronic kidney disease, unspecified: Secondary | ICD-10-CM

## 2017-11-20 DIAGNOSIS — E1122 Type 2 diabetes mellitus with diabetic chronic kidney disease: Secondary | ICD-10-CM

## 2017-11-20 DIAGNOSIS — D649 Anemia, unspecified: Secondary | ICD-10-CM | POA: Diagnosis not present

## 2017-11-20 DIAGNOSIS — E11319 Type 2 diabetes mellitus with unspecified diabetic retinopathy without macular edema: Secondary | ICD-10-CM

## 2017-11-20 LAB — COMPREHENSIVE METABOLIC PANEL
ALT: 6 U/L (ref 0–53)
AST: 13 U/L (ref 0–37)
Albumin: 3.5 g/dL (ref 3.5–5.2)
Alkaline Phosphatase: 80 U/L (ref 39–117)
BUN: 26 mg/dL — AB (ref 6–23)
CALCIUM: 9.2 mg/dL (ref 8.4–10.5)
CHLORIDE: 105 meq/L (ref 96–112)
CO2: 30 meq/L (ref 19–32)
CREATININE: 2.08 mg/dL — AB (ref 0.40–1.50)
GFR: 39.59 mL/min — ABNORMAL LOW (ref >60.00)
Glucose, Bld: 126 mg/dL — ABNORMAL HIGH (ref 70–99)
Potassium: 3.7 mEq/L (ref 3.5–5.1)
SODIUM: 141 meq/L (ref 135–145)
Total Bilirubin: 0.5 mg/dL (ref 0.2–1.2)
Total Protein: 6.6 g/dL (ref 6.0–8.3)

## 2017-11-20 LAB — CBC WITH DIFFERENTIAL/PLATELET
BASOS PCT: 0.7 % (ref 0.0–3.0)
Basophils Absolute: 0 10*3/uL (ref 0.0–0.1)
EOS ABS: 0.1 10*3/uL (ref 0.0–0.7)
EOS PCT: 2.1 % (ref 0.0–5.0)
HEMATOCRIT: 33.8 % — AB (ref 39.0–52.0)
HEMOGLOBIN: 11 g/dL — AB (ref 13.0–17.0)
LYMPHS PCT: 17.6 % (ref 12.0–46.0)
Lymphs Abs: 1.2 10*3/uL (ref 0.7–4.0)
MCHC: 32.7 g/dL (ref 30.0–36.0)
MCV: 73.2 fl — AB (ref 78.0–100.0)
Monocytes Absolute: 0.4 10*3/uL (ref 0.1–1.0)
Monocytes Relative: 6.3 % (ref 3.0–12.0)
Neutro Abs: 5.1 10*3/uL (ref 1.4–7.7)
Neutrophils Relative %: 73.3 % (ref 43.0–77.0)
Platelets: 292 10*3/uL (ref 150.0–400.0)
RBC: 4.61 Mil/uL (ref 4.22–5.81)
RDW: 17.6 % — AB (ref 11.5–15.5)
WBC: 7 10*3/uL (ref 4.0–10.5)

## 2017-11-20 LAB — POCT GLYCOSYLATED HEMOGLOBIN (HGB A1C): Hemoglobin A1C: 6.4

## 2017-11-20 MED ORDER — TRIAMCINOLONE ACETONIDE 0.1 % EX CREA: 1.0000 "application " | TOPICAL_CREAM | Freq: Two times a day (BID) | CUTANEOUS | 0 refills | Status: DC

## 2017-11-20 NOTE — Addendum Note (Signed)
Addended by: Bluford Kaufmann F on: 11/20/2017 10:25 AM   Modules accepted: Orders

## 2017-11-20 NOTE — Addendum Note (Signed)
Addended by: Gwenyth Ober R on: 11/20/2017 11:11 AM   Modules accepted: Orders

## 2017-11-20 NOTE — Progress Notes (Signed)
Subjective:    Patient ID: Austin [name], male    DOB: 04-Sep-1934, 83 y.o.   MRN: [id number]  HPI  Lab Results  Component Value Date   HGBA1C 6.7 07/19/2017   83 year old patient who is seen today for follow-up.  He has type 2 diabetes and hemoglobin A1c today 6.4.  He has chronic kidney disease and is managed with glipizide only. He is followed by cardiology and has had recent lab.  He is also been seen by [facility] and apparently carotid artery Doppler ultrasound revealed only minimal disease.  Suggested follow-up 2 years He is followed quarterly by ophthalmology with a history of diabetic retinopathy. He has hypertension and dyslipidemia.  He does have a history of chronic anemia and remote history of GI bleeding.  Past Medical History:  Diagnosis Date  . 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 CAROTID ARTERY STENOSIS 05/10/2010  . DIABETES MELLITUS, TYPE II 09/19/2007  . DISEASE, CEREBROVASCULAR NEC 03/05/2007  . GERD 03/13/2007  . HYPERLIPIDEMIA 03/05/2007  . HYPERTENSION 03/05/2007  . HYPOKALEMIA 11/09/2009  . KNEE PAIN, RIGHT 11/09/2009  . PROSTATE CANCER, HX OF 03/05/2007  . RENAL DISEASE, CHRONIC 02/03/2009     Social History   Socioeconomic History  . Marital status: Married    Spouse name: Not on file  . Number of children: Not on file  . Years of education: Not on file  . Highest education level: Not on file  Occupational History  . Not on file  Social Needs  . Financial resource strain: Not on file  . Food insecurity:    Worry: Not on file    Inability: Not on file  . Transportation needs:    Medical: Not on file    Non-medical: Not on file  Tobacco Use  . Smoking status: Former Smoker    Types: Cigarettes    Last attempt to quit: 08/22/1974    Years since quitting: 43.2  . Smokeless tobacco: Never Used  Substance and Sexual Activity  . Alcohol use: No    Alcohol/week: 0.0 oz  . Drug use: No  . Sexual activity: Not on file  Lifestyle  . Physical activity:     Days per week: Not on file    Minutes per session: Not on file  . Stress: Not on file  Relationships  . Social connections:    Talks on phone: Not on file    Gets together: Not on file    Attends religious service: Not on file    Active member of club or organization: Not on file    Attends meetings of clubs or organizations: Not on file    Relationship status: Not on file  . Intimate partner violence:    Fear of current or ex partner: Not on file    Emotionally abused: Not on file    Physically abused: Not on file    Forced sexual activity: Not on file  Other Topics Concern  . Not on file  Social History Narrative  . Not on file    Past Surgical History:  Procedure Laterality Date  . APPENDECTOMY  02-20-12  . CAROTID ARTERY ANGIOPLASTY Right Oct. 10, 2001  . ESOPHAGOGASTRODUODENOSCOPY (EGD) WITH PROPOFOL N/A 11/04/2016   Procedure: ESOPHAGOGASTRODUODENOSCOPY (EGD) WITH PROPOFOL;  Surgeon: Otis Brace, MD;  Location: [facility];  Service: Gastroenterology;  Laterality: N/A;  . LAPAROSCOPIC APPENDECTOMY  02/20/2012   Procedure: APPENDECTOMY LAPAROSCOPIC;  Surgeon: Stark Klein, MD;  Location: [facility];  Service: General;  Laterality: N/A;  . PROSTATE SURGERY     prostatectomy    Family History  Problem Relation Age of Onset  . Hypertension Mother   . Cancer Father   . Cancer Brother     Allergies  Allergen Reactions  . Aspirin Other (See Comments)    High doses causes stomach ulcer and bleeding    Current Outpatient Medications on File Prior to Visit  Medication Sig Dispense Refill  . amLODipine (NORVASC) 10 MG tablet Take 1 tablet (10 mg total) by mouth daily. 90 tablet 3  . atorvastatin (LIPITOR) 20 MG tablet Take 1 tablet (20 mg total) by mouth daily. 90 tablet 3  . clopidogrel (PLAVIX) 75 MG tablet Take 1 tablet (75 mg total) by mouth daily. Start taking on March 23rd, 2018 90 tablet 0  . doxazosin (CARDURA) 1 MG tablet Take 2 mg by mouth daily.     Marland Kitchen  doxazosin (CARDURA) 2 MG tablet TAKE 1 TABLET BY MOUTH EVERYDAY AT BEDTIME  3  . esomeprazole (NEXIUM) 40 MG capsule Take 1 capsule (40 mg total) by mouth daily. 90 capsule 0  . glipiZIDE (GLUCOTROL XL) 5 MG 24 hr tablet TAKE 1 TABLET BY MOUTH EVERY DAY 90 tablet 3  . hydrocortisone (ANUSOL-HC) 2.5 % rectal cream Place 1 application rectally 2 (two) times daily. 30 g 0  . losartan (COZAAR) 100 MG tablet Take 1 tablet (100 mg total) by mouth daily. 90 tablet 3  . metoprolol succinate (TOPROL-XL) 25 MG 24 hr tablet TAKE 2 TABLETS (50 MG TOTAL) BY MOUTH DAILY. 90 tablet 4  . nystatin cream (MYCOSTATIN) APPLY DIRECTLY ON DENTURE TWICE DAILY  1  . ONETOUCH DELICA LANCETS 67Y MISC USE TO CHECK BLOOD SUGAR DAILY AND AS NEEDED 200 each 1  . ONETOUCH VERIO test strip USE TO CHECK BLOOD SUGAR DAILY AND AS NEEDED 100 each 0  . triamcinolone cream (KENALOG) 0.1 % Apply 1 application topically 2 (two) times daily. 30 g 0   No current facility-administered medications on file prior to visit.     BP (!) 150/78 (BP Location: Left Arm, Patient Position: Sitting, Cuff Size: Large)   Pulse 66   Temp 98.9 F (37.2 C) (Oral)   Wt 195 lb (88.5 kg)   SpO2 98%   BMI 30.09 kg/m      Review of Systems  Constitutional: Negative for appetite change, chills, fatigue and fever.  HENT: Negative for congestion, dental problem, ear pain, hearing loss, sore throat, tinnitus, trouble swallowing and voice change.   Eyes: Negative for pain, discharge and visual disturbance.  Respiratory: Negative for cough, chest tightness, wheezing and stridor.   Cardiovascular: Negative for chest pain, palpitations and leg swelling.  Gastrointestinal: Negative for abdominal distention, abdominal pain, blood in stool, constipation, diarrhea, nausea and vomiting.  Genitourinary: Negative for difficulty urinating, discharge, flank pain, genital sores, hematuria and urgency.  Musculoskeletal: Negative for arthralgias, back pain, gait  problem, joint swelling, myalgias and neck stiffness.  Skin: Negative for rash.  Neurological: Negative for dizziness, syncope, speech difficulty, weakness, numbness and headaches.  Hematological: Negative for adenopathy. Does not bruise/bleed easily.  Psychiatric/Behavioral: Negative for behavioral problems and dysphoric mood. The patient is not nervous/anxious.        Objective:   Physical Exam  Constitutional: He is oriented to person, place, and time. He appears well-developed.  Blood pressure 150/70  HENT:  Head: Normocephalic.  Right Ear: External ear normal.  Left Ear: External ear normal.  Eyes: Conjunctivae and  EOM are normal.  Neck: Normal range of motion.  Cardiovascular: Normal rate.  Murmur heard. Pulmonary/Chest: Breath sounds normal.  Abdominal: Bowel sounds are normal.  Musculoskeletal: Normal range of motion. He exhibits no edema or tenderness.  Neurological: He is alert and oriented to person, place, and time.  Psychiatric: He has a normal mood and affect. His behavior is normal.          Assessment & Plan:   Essential hypertension.  Fair control Diabetes mellitus Diabetic retinopathy.  Continue close follow-up with ophthalmology Chronic kidney disease.  Will check renal indices Anemia.  Check follow-up CBC  CPX 4 months  Nyoka Cowden

## 2017-11-20 NOTE — Patient Instructions (Signed)
Limit your sodium (Salt) intake    It is important that you exercise regularly, at least 20 minutes 3 to 4 times per week.  If you develop chest pain or shortness of breath seek  medical attention.  Return in 4 months for follow-up  Please see your eye doctor regularly to check for diabetic eye damage

## 2017-12-03 ENCOUNTER — Other Ambulatory Visit: Payer: Self-pay | Admitting: Internal Medicine

## 2017-12-16 ENCOUNTER — Other Ambulatory Visit: Payer: Self-pay | Admitting: Internal Medicine

## 2017-12-18 ENCOUNTER — Other Ambulatory Visit: Payer: Self-pay | Admitting: Internal Medicine

## 2017-12-20 LAB — HM DIABETES EYE EXAM

## 2017-12-25 LAB — HM DIABETES EYE EXAM

## 2017-12-28 ENCOUNTER — Encounter: Payer: Self-pay | Admitting: Internal Medicine

## 2018-01-11 ENCOUNTER — Other Ambulatory Visit: Payer: Self-pay | Admitting: Internal Medicine

## 2018-03-16 ENCOUNTER — Other Ambulatory Visit: Payer: Self-pay | Admitting: Internal Medicine

## 2018-03-22 ENCOUNTER — Encounter: Payer: Self-pay | Admitting: Internal Medicine

## 2018-03-22 ENCOUNTER — Ambulatory Visit: Payer: Medicare Other | Admitting: Internal Medicine

## 2018-03-22 VITALS — BP 160/60 | HR 60 | Temp 98.6°F | Wt 197.6 lb

## 2018-03-22 DIAGNOSIS — N189 Chronic kidney disease, unspecified: Secondary | ICD-10-CM

## 2018-03-22 DIAGNOSIS — E1122 Type 2 diabetes mellitus with diabetic chronic kidney disease: Secondary | ICD-10-CM

## 2018-03-22 DIAGNOSIS — I10 Essential (primary) hypertension: Secondary | ICD-10-CM

## 2018-03-22 DIAGNOSIS — E785 Hyperlipidemia, unspecified: Secondary | ICD-10-CM

## 2018-03-22 DIAGNOSIS — E0821 Diabetes mellitus due to underlying condition with diabetic nephropathy: Secondary | ICD-10-CM

## 2018-03-22 DIAGNOSIS — E1121 Type 2 diabetes mellitus with diabetic nephropathy: Secondary | ICD-10-CM

## 2018-03-22 LAB — POCT GLYCOSYLATED HEMOGLOBIN (HGB A1C): Hemoglobin A1C: 6.6 % — AB (ref 4.0–5.6)

## 2018-03-22 NOTE — Patient Instructions (Signed)
Limit your sodium (Salt) intake   Please check your hemoglobin A1c every 3-6 months  Please check your blood pressure on a regular basis.  If it is consistently greater than 150/90, please make an office appointment.    It is important that you exercise regularly, at least 20 minutes 3 to 4 times per week.  If you develop chest pain or shortness of breath seek  medical attention.

## 2018-03-22 NOTE — Progress Notes (Signed)
Subjective:    Patient ID: Austin [name], male    DOB: [birth year], 83 y.o.   MRN: [id number]  HPI  Lab Results  Component Value Date   HGBA1C 6.6 (A) 03/22/2018   83 year old patient who is seen today for follow-up of type 2 diabetes.  This is complicated by chronic kidney disease. He continues to do well.  He has been seen by cardiology recently and isosorbide mononitrate 30 mg added to his regimen.  He also has a history of carotid artery disease.  Most recent Doppler ultrasound revealed only minimal disease and to your follow-up suggested He has dyslipidemia and remains on statin therapy Denies any cardiopulmonary complaints.  Past Medical History:  Diagnosis Date  . 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 CAROTID ARTERY STENOSIS 05/10/2010  . DIABETES MELLITUS, TYPE II 09/19/2007  . DISEASE, CEREBROVASCULAR NEC 03/05/2007  . GERD 03/13/2007  . HYPERLIPIDEMIA 03/05/2007  . HYPERTENSION 03/05/2007  . HYPOKALEMIA 11/09/2009  . KNEE PAIN, RIGHT 11/09/2009  . PROSTATE CANCER, HX OF 03/05/2007  . RENAL DISEASE, CHRONIC 02/03/2009     Social History   Socioeconomic History  . Marital status: Married    Spouse name: Not on file  . Number of children: Not on file  . Years of education: Not on file  . Highest education level: Not on file  Occupational History  . Not on file  Social Needs  . Financial resource strain: Not on file  . Food insecurity:    Worry: Not on file    Inability: Not on file  . Transportation needs:    Medical: Not on file    Non-medical: Not on file  Tobacco Use  . Smoking status: Former Smoker    Types: Cigarettes    Last attempt to quit: 08/22/1974    Years since quitting: 43.6  . Smokeless tobacco: Never Used  Substance and Sexual Activity  . Alcohol use: No    Alcohol/week: 0.0 oz  . Drug use: No  . Sexual activity: Not on file  Lifestyle  . Physical activity:    Days per week: Not on file    Minutes per session: Not on file  . Stress: Not  on file  Relationships  . Social connections:    Talks on phone: Not on file    Gets together: Not on file    Attends religious service: Not on file    Active member of club or organization: Not on file    Attends meetings of clubs or organizations: Not on file    Relationship status: Not on file  . Intimate partner violence:    Fear of current or ex partner: Not on file    Emotionally abused: Not on file    Physically abused: Not on file    Forced sexual activity: Not on file  Other Topics Concern  . Not on file  Social History Narrative  . Not on file    Past Surgical History:  Procedure Laterality Date  . APPENDECTOMY  02-20-12  . CAROTID ARTERY ANGIOPLASTY Right Oct. 10, 2001  . ESOPHAGOGASTRODUODENOSCOPY (EGD) WITH PROPOFOL N/A 11/04/2016   Procedure: ESOPHAGOGASTRODUODENOSCOPY (EGD) WITH PROPOFOL;  Surgeon: Otis Brace, MD;  Location: [facility];  Service: Gastroenterology;  Laterality: N/A;  . LAPAROSCOPIC APPENDECTOMY  02/20/2012   Procedure: APPENDECTOMY LAPAROSCOPIC;  Surgeon: Stark Klein, MD;  Location: [facility];  Service: General;  Laterality: N/A;  . PROSTATE SURGERY     prostatectomy    Family History  Problem Relation Age of Onset  . Hypertension Mother   . Cancer Father   . Cancer Brother     Allergies  Allergen Reactions  . Aspirin Other (See Comments)    High doses causes stomach ulcer and bleeding    Current Outpatient Medications on File Prior to Visit  Medication Sig Dispense Refill  . amLODipine (NORVASC) 10 MG tablet Take 1 tablet (10 mg total) by mouth daily. 90 tablet 3  . atorvastatin (LIPITOR) 20 MG tablet Take 1 tablet (20 mg total) by mouth daily. 90 tablet 3  . clopidogrel (PLAVIX) 75 MG tablet Take 1 tablet (75 mg total) by mouth daily. Start taking on March 23rd, 2018 90 tablet 0  . esomeprazole (NEXIUM) 40 MG capsule TAKE 1 CAPSULE BY MOUTH EVERY DAY 90 capsule 0  . GLIPIZIDE XL 5 MG 24 hr tablet TAKE 1 TABLET BY MOUTH EVERY DAY 90  tablet 3  . hydrocortisone (ANUSOL-HC) 2.5 % rectal cream Place 1 application rectally 2 (two) times daily. 30 g 0  . losartan (COZAAR) 100 MG tablet TAKE 1 TABLET BY MOUTH EVERY DAY 90 tablet 3  . metoprolol succinate (TOPROL-XL) 25 MG 24 hr tablet TAKE 2 TABLETS (50 MG TOTAL) BY MOUTH DAILY. 90 tablet 4  . nystatin cream (MYCOSTATIN) APPLY DIRECTLY ON DENTURE TWICE DAILY  1  . ONETOUCH DELICA LANCETS 00X MISC USE TO CHECK BLOOD SUGAR DAILY AND AS NEEDED 200 each 1  . ONETOUCH VERIO test strip USE TO CHECK BLOOD SUGAR DAILY AND AS NEEDED 200 each 0  . triamcinolone cream (KENALOG) 0.1 % Apply 1 application topically 2 (two) times daily. 30 g 0  . doxazosin (CARDURA) 2 MG tablet TAKE 1 TABLET BY MOUTH EVERYDAY AT BEDTIME  3  . hydrocortisone-pramoxine (ANALPRAM-HC) 2.5-1 % rectal cream USE ONCE DAILY OR AS DIRECTED  2  . isosorbide mononitrate (IMDUR) 30 MG 24 hr tablet Take 30 mg by mouth every morning.  3   No current facility-administered medications on file prior to visit.     BP (!) 160/60 (BP Location: Left Arm, Patient Position: Sitting, Cuff Size: Normal)   Pulse 60   Temp 98.6 F (37 C) (Oral)   Wt 197 lb 9.6 oz (89.6 kg)   SpO2 98%   BMI 30.49 kg/m     Review of Systems  Constitutional: Negative for appetite change, chills, fatigue and fever.  HENT: Negative for congestion, dental problem, ear pain, hearing loss, sore throat, tinnitus, trouble swallowing and voice change.   Eyes: Negative for pain, discharge and visual disturbance.  Respiratory: Negative for cough, chest tightness, wheezing and stridor.   Cardiovascular: Negative for chest pain, palpitations and leg swelling.  Gastrointestinal: Negative for abdominal distention, abdominal pain, blood in stool, constipation, diarrhea, nausea and vomiting.  Genitourinary: Negative for difficulty urinating, discharge, flank pain, genital sores, hematuria and urgency.  Musculoskeletal: Negative for arthralgias, back pain, gait  problem, joint swelling, myalgias and neck stiffness.  Skin: Negative for rash.  Neurological: Negative for dizziness, syncope, speech difficulty, weakness, numbness and headaches.  Hematological: Negative for adenopathy. Does not bruise/bleed easily.  Psychiatric/Behavioral: Negative for behavioral problems and dysphoric mood. The patient is not nervous/anxious.        Objective:   Physical Exam  Constitutional: He is oriented to person, place, and time. He appears well-developed.  Blood pressure 150/60  HENT:  Head: Normocephalic.  Right Ear: External ear normal.  Left Ear: External ear normal.  Eyes: Conjunctivae and EOM are  normal.  Neck: Normal range of motion.  Cardiovascular: Normal rate and normal heart sounds.  Slight irregularity  Pulmonary/Chest: Breath sounds normal.  Abdominal: Bowel sounds are normal.  Musculoskeletal: Normal range of motion. He exhibits no edema or tenderness.  Neurological: He is alert and oriented to person, place, and time.  Psychiatric: He has a normal mood and affect. His behavior is normal.          Assessment & Plan:  Essential hypertension.  Systolic blood pressure a bit elevated today. He has not started isosorbide mononitrate.  This was encouraged Diabetes mellitus.  Hemoglobin A1c 6.6.  We will continue glipizide.  Last GFR 39.  We will continue to avoid metformin Carotid artery disease  history of GI bleed.  Recent hemoglobin 11.0  Follow-up 3 months No change in medical regimen Cardiology and ophthalmology follow-up as scheduled  Marletta Lor

## 2018-03-23 ENCOUNTER — Encounter: Payer: Self-pay | Admitting: Internal Medicine

## 2018-04-19 ENCOUNTER — Other Ambulatory Visit: Payer: Self-pay | Admitting: Internal Medicine

## 2018-04-20 ENCOUNTER — Other Ambulatory Visit: Payer: Self-pay | Admitting: Internal Medicine

## 2018-04-20 NOTE — Telephone Encounter (Signed)
Okay for refill? Please advise 

## 2018-05-03 ENCOUNTER — Ambulatory Visit: Payer: Medicare Other | Admitting: Internal Medicine

## 2018-05-03 ENCOUNTER — Encounter: Payer: Self-pay | Admitting: Internal Medicine

## 2018-05-03 VITALS — BP 138/60 | HR 56 | Temp 98.5°F | Wt 198.4 lb

## 2018-05-03 DIAGNOSIS — L723 Sebaceous cyst: Secondary | ICD-10-CM

## 2018-05-03 MED ORDER — HYDROCORTISONE ACE-PRAMOXINE 1-1 % RE CREA: 1.0000 "application " | TOPICAL_CREAM | Freq: Two times a day (BID) | RECTAL | 4 refills | Status: DC

## 2018-05-03 MED ORDER — DOXYCYCLINE HYCLATE 100 MG PO TABS: 100.0000 mg | ORAL_TABLET | Freq: Two times a day (BID) | ORAL | 0 refills | Status: DC

## 2018-05-03 MED ORDER — ESOMEPRAZOLE MAGNESIUM 40 MG PO CPDR: DELAYED_RELEASE_CAPSULE | ORAL | 3 refills | Status: DC

## 2018-05-03 NOTE — Patient Instructions (Signed)
Epidermal Cyst An epidermal cyst is sometimes called an epidermal inclusion cyst or an infundibular cyst. It is a sac made of skin tissue. The sac contains a substance called keratin. Keratin is a protein that is normally secreted through the hair follicles. When keratin becomes trapped in the top layer of skin (epidermis), it can form an epidermal cyst. Epidermal cysts are usually found on the face, neck, trunk, and genitals. These cysts are usually harmless (benign), and they may not cause symptoms unless they become infected. It is important not to pop epidermal cysts yourself. What are the causes? This condition may be caused by:  A blocked hair follicle.  A hair that curls and re-enters the skin instead of growing straight out of the skin (ingrown hair).  A blocked pore.  Irritated skin.  An injury to the skin.  Certain conditions that are passed along from parent to child (inherited).  Human papillomavirus (HPV).  What increases the risk? The following factors may make you more likely to develop an epidermal cyst:  Having acne.  Being overweight.  Wearing tight clothing.  What are the signs or symptoms? The only symptom of this condition may be a small, painless lump underneath the skin. When an epidermal cyst becomes infected, symptoms may include:  Redness.  Inflammation.  Tenderness.  Warmth.  Fever.  Keratin draining from the cyst. Keratin may look like a grayish-white, bad-smelling substance.  Pus draining from the cyst.  How is this diagnosed? This condition is diagnosed with a physical exam. In some cases, you may have a sample of tissue (biopsy) taken from your cyst to be examined under a microscope or tested for bacteria. You may be referred to a health care provider who specializes in skin care (dermatologist). How is this treated? In many cases, epidermal cysts go away on their own without treatment. If a cyst becomes infected, treatment may  include:  Opening and draining the cyst. After draining, minor surgery to remove the rest of the cyst may be done.  Antibiotic medicine to help prevent infection.  Injections of medicines (steroids) that help to reduce inflammation.  Surgery to remove the cyst. Surgery may be done if: ? The cyst becomes large. ? The cyst bothers you. ? There is a chance that the cyst could turn into cancer.  Follow these instructions at home:  Take over-the-counter and prescription medicines only as told by your health care provider.  If you were prescribed an antibiotic, use it as told by your health care provider. Do not stop using the antibiotic even if you start to feel better.  Keep the area around your cyst clean and dry.  Wear loose, dry clothing.  Do not try to pop your cyst.  Avoid touching your cyst.  Check your cyst every day for signs of infection.  Keep all follow-up visits as told by your health care provider. This is important. How is this prevented?  Wear clean, dry, clothing.  Avoid wearing tight clothing.  Keep your skin clean and dry. Shower or take baths every day.  Wash your body with a benzoyl peroxide wash when you shower or bathe. Contact a health care provider if:  Your cyst develops symptoms of infection.  Your condition is not improving or is getting worse.  You develop a cyst that looks different from other cysts you have had.  You have a fever. Get help right away if:  Redness spreads from the cyst into the surrounding area. This information is   not intended to replace advice given to you by your health care provider. Make sure you discuss any questions you have with your health care provider. Document Released: 07/09/2004 Document Revised: 04/06/2016 Document Reviewed: 06/10/2015 [employer]vier Interactive Patient Education  2018 [employer]  

## 2018-05-03 NOTE — Progress Notes (Signed)
Subjective:    Patient ID: Austin [name], male    DOB: 1935-05-27, 83 y.o.   MRN: [id number]  HPI 83 year old patient who is seen today complaint of a slightly tender nodule in the right submandibular area.  This has been present for a number of weeks and does drain occasionally with a foul-smelling discharge.  In general he is done quite well. He has stable hypertension and diabetes  Past Medical History:  Diagnosis Date  . 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 CAROTID ARTERY STENOSIS 05/10/2010  . DIABETES MELLITUS, TYPE II 09/19/2007  . DISEASE, CEREBROVASCULAR NEC 03/05/2007  . GERD 03/13/2007  . HYPERLIPIDEMIA 03/05/2007  . HYPERTENSION 03/05/2007  . HYPOKALEMIA 11/09/2009  . KNEE PAIN, RIGHT 11/09/2009  . PROSTATE CANCER, HX OF 03/05/2007  . RENAL DISEASE, CHRONIC 02/03/2009     Social History   Socioeconomic History  . Marital status: Married    Spouse name: Not on file  . Number of children: Not on file  . Years of education: Not on file  . Highest education level: Not on file  Occupational History  . Not on file  Social Needs  . Financial resource strain: Not on file  . Food insecurity:    Worry: Not on file    Inability: Not on file  . Transportation needs:    Medical: Not on file    Non-medical: Not on file  Tobacco Use  . Smoking status: Former Smoker    Types: Cigarettes    Last attempt to quit: 08/22/1974    Years since quitting: 43.7  . Smokeless tobacco: Never Used  Substance and Sexual Activity  . Alcohol use: No    Alcohol/week: 0.0 standard drinks  . Drug use: No  . Sexual activity: Not on file  Lifestyle  . Physical activity:    Days per week: Not on file    Minutes per session: Not on file  . Stress: Not on file  Relationships  . Social connections:    Talks on phone: Not on file    Gets together: Not on file    Attends religious service: Not on file    Active member of club or organization: Not on file    Attends meetings of clubs or  organizations: Not on file    Relationship status: Not on file  . Intimate partner violence:    Fear of current or ex partner: Not on file    Emotionally abused: Not on file    Physically abused: Not on file    Forced sexual activity: Not on file  Other Topics Concern  . Not on file  Social History Narrative  . Not on file    Past Surgical History:  Procedure Laterality Date  . APPENDECTOMY  02-20-12  . CAROTID ARTERY ANGIOPLASTY Right Oct. 10, 2001  . ESOPHAGOGASTRODUODENOSCOPY (EGD) WITH PROPOFOL N/A 11/04/2016   Procedure: ESOPHAGOGASTRODUODENOSCOPY (EGD) WITH PROPOFOL;  Surgeon: Otis Brace, MD;  Location: [place];  Service: Gastroenterology;  Laterality: N/A;  . LAPAROSCOPIC APPENDECTOMY  02/20/2012   Procedure: APPENDECTOMY LAPAROSCOPIC;  Surgeon: Stark Klein, MD;  Location: [facility];  Service: General;  Laterality: N/A;  . PROSTATE SURGERY     prostatectomy    Family History  Problem Relation Age of Onset  . Hypertension Mother   . Cancer Father   . Cancer Brother     Allergies  Allergen Reactions  . Aspirin Other (See Comments)    High doses causes stomach ulcer and  bleeding    Current Outpatient Medications on File Prior to Visit  Medication Sig Dispense Refill  . amLODipine (NORVASC) 10 MG tablet Take 1 tablet (10 mg total) by mouth daily. 90 tablet 3  . atorvastatin (LIPITOR) 20 MG tablet Take 1 tablet (20 mg total) by mouth daily. 90 tablet 3  . clopidogrel (PLAVIX) 75 MG tablet Take 1 tablet (75 mg total) by mouth daily. Start taking on March 23rd, 2018 90 tablet 0  . doxazosin (CARDURA) 2 MG tablet TAKE 1 TABLET BY MOUTH EVERYDAY AT BEDTIME  3  . esomeprazole (NEXIUM) 40 MG capsule TAKE 1 CAPSULE BY MOUTH EVERY DAY 90 capsule 0  . GLIPIZIDE XL 5 MG 24 hr tablet TAKE 1 TABLET BY MOUTH EVERY DAY 90 tablet 3  . hydrocortisone (ANUSOL-HC) 2.5 % rectal cream Place 1 application rectally 2 (two) times daily. 30 g 0  . isosorbide mononitrate (IMDUR) 30 MG 24  hr tablet Take 30 mg by mouth every morning.  3  . losartan (COZAAR) 100 MG tablet TAKE 1 TABLET BY MOUTH EVERY DAY 90 tablet 3  . metoprolol succinate (TOPROL-XL) 25 MG 24 hr tablet TAKE 2 TABLETS (50 MG TOTAL) BY MOUTH DAILY. 90 tablet 4  . nystatin cream (MYCOSTATIN) APPLY DIRECTLY ON DENTURE TWICE DAILY  1  . ONETOUCH DELICA LANCETS 82N MISC USE TO CHECK BLOOD SUGAR DAILY AND AS NEEDED 200 each 1  . ONETOUCH VERIO test strip USE TO CHECK BLOOD SUGAR DAILY AND AS NEEDED 200 each 0  . triamcinolone cream (KENALOG) 0.1 % Apply 1 application topically 2 (two) times daily. 30 g 0   No current facility-administered medications on file prior to visit.     BP 138/60 (BP Location: Right Arm, Patient Position: Sitting, Cuff Size: Large)   Pulse (!) 56   Temp 98.5 F (36.9 C) (Oral)   Wt 198 lb 6.4 oz (90 kg)   SpO2 97%   BMI 30.62 kg/m      Review of Systems  Constitutional: Negative.   Skin: Positive for wound.       Objective:   Physical Exam  Constitutional: He appears well-developed and well-nourished. No distress.  Skin:  A 1.5 cm x 3 cm mildly tender nodule in the right submandibular area.  No obvious drainage.  No fluctuance          Assessment & Plan:   Mildly inflamed sebaceous cyst right submandibular area.  This has grown a bit larger and drains intermittently.  Will treat with doxycycline for 10 days  Marletta Lor

## 2018-05-22 LAB — HM DIABETES EYE EXAM

## 2018-06-05 ENCOUNTER — Encounter: Payer: Self-pay | Admitting: Internal Medicine

## 2018-06-12 ENCOUNTER — Ambulatory Visit: Payer: Medicare Other | Admitting: Adult Health

## 2018-06-12 ENCOUNTER — Encounter: Payer: Self-pay | Admitting: Adult Health

## 2018-06-12 VITALS — BP 162/60 | Temp 98.7°F | Wt 200.0 lb

## 2018-06-12 DIAGNOSIS — E0821 Diabetes mellitus due to underlying condition with diabetic nephropathy: Secondary | ICD-10-CM | POA: Diagnosis not present

## 2018-06-12 DIAGNOSIS — E785 Hyperlipidemia, unspecified: Secondary | ICD-10-CM | POA: Diagnosis not present

## 2018-06-12 DIAGNOSIS — I6529 Occlusion and stenosis of unspecified carotid artery: Secondary | ICD-10-CM | POA: Diagnosis not present

## 2018-06-12 DIAGNOSIS — L723 Sebaceous cyst: Secondary | ICD-10-CM

## 2018-06-12 DIAGNOSIS — Z23 Encounter for immunization: Secondary | ICD-10-CM | POA: Diagnosis not present

## 2018-06-12 DIAGNOSIS — Z7689 Persons encountering health services in other specified circumstances: Secondary | ICD-10-CM

## 2018-06-12 DIAGNOSIS — I10 Essential (primary) hypertension: Secondary | ICD-10-CM

## 2018-06-12 MED ORDER — DOXYCYCLINE HYCLATE 100 MG PO CAPS: 100.0000 mg | ORAL_CAPSULE | Freq: Two times a day (BID) | ORAL | 0 refills | Status: DC

## 2018-06-12 NOTE — Progress Notes (Signed)
Patient presents to clinic today to establish care. He is a pleasant 83 year old male who  has a past medical history of ANEMIA DUE TO CHRONIC BLOOD LOSS (03/13/2007), CAROTID ARTERY STENOSIS (05/10/2010), DIABETES MELLITUS, TYPE II (09/19/2007), DISEASE, CEREBROVASCULAR NEC (03/05/2007), GERD (03/13/2007), HYPERLIPIDEMIA (03/05/2007), HYPERTENSION (03/05/2007), HYPOKALEMIA (11/09/2009), KNEE PAIN, RIGHT (11/09/2009), PROSTATE CANCER, HX OF (03/05/2007), and RENAL DISEASE, CHRONIC (02/03/2009).   Acute Concerns: Establish Care   Chronic Issues: Diabetes mellitus-controlled with glipizide 5 mg extended release daily Lab Results  Component Value Date   HGBA1C 6.6 (A) 03/22/2018    Hypertension -controlled with Norvasc 10 mg daily, losartan 100 mg daily, Imdur, Cardura  and metoprolol 50 mg extended release daily BP Readings from Last 3 Encounters:  06/12/18 (!) 162/60  05/03/18 138/60  03/22/18 (!) 160/60   Hyperlipidemia -takes Lipitor 20 mg daily Lab Results  Component Value Date   CHOL 170 02/10/2016   HDL 40.40 02/10/2016   LDLCALC 107 (H) 02/10/2016   TRIG 111.0 02/10/2016   CHOLHDL 4 02/10/2016   CAD- Takes Plavix. Last Doppler in 10/2017 showed mild stenosis   CKD - Does not see Nephrology at this time  Lab Results  Component Value Date   CREATININE 2.08 (H) 11/20/2017   BUN 26 (H) 11/20/2017   NA 141 11/20/2017   K 3.7 11/20/2017   CL 105 11/20/2017   CO2 30 11/20/2017   Hx of Prostate Cancer - Had prostate removed in 1993.   His only acute concern is that of inflamed sebaceous cyst on rihgt manidbile area. He was seen by Dr. Raliegh Ip for this issue in mid September and prescribed Doxycycline. He reports that the cyst has improved but he continues to have a foul odor coming from the wound.     Health Maintenance: Dental -- Routine  Vision -- Routine Care - Dr. Satira Sark  Immunizations -- UTD  Colonoscopy -- No longer needed  Treatment Team  - Cardiology - Dr. Terrence Dupont  -  Vascular Surgery - Dr. Pearletha Furl, NP  - Urologist - Is seen at [facility] every 6 months.    Past Medical History:  Diagnosis Date  . 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 CAROTID ARTERY STENOSIS 05/10/2010  . DIABETES MELLITUS, TYPE II 09/19/2007  . DISEASE, CEREBROVASCULAR NEC 03/05/2007  . GERD 03/13/2007  . HYPERLIPIDEMIA 03/05/2007  . HYPERTENSION 03/05/2007  . HYPOKALEMIA 11/09/2009  . KNEE PAIN, RIGHT 11/09/2009  . PROSTATE CANCER, HX OF 03/05/2007  . RENAL DISEASE, CHRONIC 02/03/2009    Past Surgical History:  Procedure Laterality Date  . APPENDECTOMY  02-20-12  . CAROTID ARTERY ANGIOPLASTY Right Oct. 10, 2001  . ESOPHAGOGASTRODUODENOSCOPY (EGD) WITH PROPOFOL N/A 11/04/2016   Procedure: ESOPHAGOGASTRODUODENOSCOPY (EGD) WITH PROPOFOL;  Surgeon: Otis Brace, MD;  Location: [place];  Service: Gastroenterology;  Laterality: N/A;  . LAPAROSCOPIC APPENDECTOMY  02/20/2012   Procedure: APPENDECTOMY LAPAROSCOPIC;  Surgeon: Stark Klein, MD;  Location: [facility];  Service: General;  Laterality: N/A;  . PROSTATE SURGERY     prostatectomy    Current Outpatient Medications on File Prior to Visit  Medication Sig Dispense Refill  . amLODipine (NORVASC) 10 MG tablet Take 1 tablet (10 mg total) by mouth daily. 90 tablet 3  . atorvastatin (LIPITOR) 20 MG tablet Take 1 tablet (20 mg total) by mouth daily. 90 tablet 3  . clopidogrel (PLAVIX) 75 MG tablet Take 1 tablet (75 mg total) by mouth daily. Start taking on March 23rd, 2018 90 tablet  0  . doxazosin (CARDURA) 2 MG tablet TAKE 1 TABLET BY MOUTH EVERYDAY AT BEDTIME  3  . esomeprazole (NEXIUM) 40 MG capsule TAKE 1 CAPSULE BY MOUTH EVERY DAY 90 capsule 3  . GLIPIZIDE XL 5 MG 24 hr tablet TAKE 1 TABLET BY MOUTH EVERY DAY 90 tablet 3  . hydrocortisone (ANUSOL-HC) 2.5 % rectal cream Place 1 application rectally 2 (two) times daily. 30 g 0  . isosorbide mononitrate (IMDUR) 30 MG 24 hr tablet Take 30 mg by mouth every morning.  3    . losartan (COZAAR) 100 MG tablet TAKE 1 TABLET BY MOUTH EVERY DAY 90 tablet 3  . metoprolol succinate (TOPROL-XL) 25 MG 24 hr tablet TAKE 2 TABLETS (50 MG TOTAL) BY MOUTH DAILY. 90 tablet 4  . nystatin cream (MYCOSTATIN) APPLY DIRECTLY ON DENTURE TWICE DAILY  1  . ONETOUCH DELICA LANCETS 27P MISC USE TO CHECK BLOOD SUGAR DAILY AND AS NEEDED 200 each 1  . ONETOUCH VERIO test strip USE TO CHECK BLOOD SUGAR DAILY AND AS NEEDED 200 each 0  . pramoxine-hydrocortisone (PROCTOCREAM-HC) 1-1 % rectal cream Place 1 application rectally 2 (two) times daily. 30 g 4   No current facility-administered medications on file prior to visit.     Allergies  Allergen Reactions  . Aspirin Other (See Comments)    High doses causes stomach ulcer and bleeding    Family History  Problem Relation Age of Onset  . Hypertension Mother   . Cancer Father   . Cancer Brother     Social History   Socioeconomic History  . Marital status: Married    Spouse name: Not on file  . Number of children: Not on file  . Years of education: Not on file  . Highest education level: Not on file  Occupational History  . Not on file  Social Needs  . Financial resource strain: Not on file  . Food insecurity:    Worry: Not on file    Inability: Not on file  . Transportation needs:    Medical: Not on file    Non-medical: Not on file  Tobacco Use  . Smoking status: Former Smoker    Types: Cigarettes    Last attempt to quit: 08/22/1974    Years since quitting: 43.8  . Smokeless tobacco: Never Used  Substance and Sexual Activity  . Alcohol use: No    Alcohol/week: 0.0 standard drinks  . Drug use: No  . Sexual activity: Not on file  Lifestyle  . Physical activity:    Days per week: Not on file    Minutes per session: Not on file  . Stress: Not on file  Relationships  . Social connections:    Talks on phone: Not on file    Gets together: Not on file    Attends religious service: Not on file    Active member of  club or organization: Not on file    Attends meetings of clubs or organizations: Not on file    Relationship status: Not on file  . Intimate partner violence:    Fear of current or ex partner: Not on file    Emotionally abused: Not on file    Physically abused: Not on file    Forced sexual activity: Not on file  Other Topics Concern  . Not on file  Social History Narrative  . Not on file    Review of Systems  Constitutional: Negative.   HENT: Negative.   Respiratory: Negative.  Cardiovascular: Negative.   Genitourinary: Negative.   Musculoskeletal: Negative.   Skin: Negative.        Cyst    Neurological: Negative.     BP (!) 162/60   Temp 98.7 F (37.1 C) (Oral)   Wt 200 lb (90.7 kg)   BMI 30.86 kg/m   Physical Exam  Constitutional: He is oriented to person, place, and time. He appears well-developed and well-nourished. No distress.  Cardiovascular: Normal rate, regular rhythm, normal heart sounds and intact distal pulses.  Pulmonary/Chest: Effort normal and breath sounds normal.  Abdominal: Soft. Bowel sounds are normal. He exhibits no distension and no mass. There is no tenderness. There is no rebound and no guarding. No hernia.  Neurological: He is alert and oriented to person, place, and time.  Skin: Skin is warm and dry. Capillary refill takes less than 2 seconds. He is not diaphoretic.  Dime sized non fluctuant abscess noted on right mandible area. No active drainage   Psychiatric: He has a normal mood and affect. His behavior is normal. Thought content normal.  Nursing note and vitals reviewed.   Recent Results (from the past 2160 hour(s))  POCT glycosylated hemoglobin (Hb A1C)     Status: Abnormal   Collection Time: 03/22/18 10:25 AM  Result Value Ref Range   Hemoglobin A1C 6.6 (A) 4.0 - 5.6 %   HbA1c POC (<> result, manual entry)  4.0 - 5.6 %   HbA1c, POC (prediabetic range)  5.7 - 6.4 %   HbA1c, POC (controlled diabetic range)  0.0 - 7.0 %  HM DIABETES  EYE EXAM     Status: Abnormal   Collection Time: 05/22/18 12:00 AM  Result Value Ref Range   HM Diabetic Eye Exam Retinopathy (A) No Retinopathy    Assessment/Plan:  1. Encounter to establish care - Encouraged diet and exercise  - Follow up in 3 months for diabetic check   2. Stenosis of carotid artery, unspecified laterality - Follow up with vascular as directed  - Continue with Plavix and lipitor   3. Diabetes mellitus due to underlying condition with diabetic nephropathy, without long-term current use of insulin (HCC) - Well controlled.  - Due for recheck next month   4. Dyslipidemia - Will check lipid panel at upcoming appointment   5. Essential hypertension - Continue to follow up with Cardiology   6. Sebaceous cyst  - doxycycline (VIBRAMYCIN) 100 MG capsule; Take 1 capsule (100 mg total) by mouth 2 (two) times daily.  Dispense: 20 capsule; Refill: 0  Dorothyann Peng, NP

## 2018-06-12 NOTE — Addendum Note (Signed)
Addended by: Miles Costain T on: 06/12/2018 11:15 AM   Modules accepted: Orders

## 2018-06-12 NOTE — Patient Instructions (Signed)
It was great seeing you today   Please follow up with me next month for diabetes check. You can follow up after you see your Cardiologist.   Please let me know if you need anything in the meantime

## 2018-06-19 ENCOUNTER — Other Ambulatory Visit: Payer: Self-pay | Admitting: Internal Medicine

## 2018-06-19 NOTE — Telephone Encounter (Signed)
Cory's pt

## 2018-06-20 NOTE — Telephone Encounter (Signed)
Ok to send in for one year

## 2018-06-21 ENCOUNTER — Other Ambulatory Visit: Payer: Self-pay | Admitting: Adult Health

## 2018-06-21 NOTE — Telephone Encounter (Signed)
Sent to the pharmacy by e-scribe. 

## 2018-06-22 ENCOUNTER — Other Ambulatory Visit: Payer: Self-pay | Admitting: Internal Medicine

## 2018-07-24 ENCOUNTER — Ambulatory Visit: Payer: Self-pay

## 2018-07-24 NOTE — Telephone Encounter (Signed)
Pt. Reports he was on an antibiotic "a couple of weeks ago." Started seeing some burgundy colored blood mixed in his stool last week. Thought it"would check up and quit." Sees it every time he has a BM. Unsure how much blood it is - "a little can look like a lot." No diarrhea or constipation. States he has a history of hemorrhoids, "but this looks different. Sometimes I have small clots." Pt. Is on Plavix. Has some "gas pain sometimes." Spoke with Mendel Ryder - no availability today. Pt. Scheduled for tomorrow. Instructed pt. If bleeding increases to go to ED for evaluation. Verbalizes understanding.    Reason for Disposition . MODERATE rectal bleeding (small blood clots, passing blood without stool, or toilet water turns red)  Answer Assessment - Initial Assessment Questions 1. APPEARANCE of BLOOD: "What color is it?" "Is it passed separately, on the surface of the stool, or mixed in with the stool?"      Mixed in stool 2. AMOUNT: "How much blood was passed?"        uNSURE 3. FREQUENCY: "How many times has blood been passed with the stools?"      Every time he has a BM 4. ONSET: "When was the blood first seen in the stools?" (Days or weeks)      Started 1 week ago 5. DIARRHEA: "Is there also some diarrhea?" If so, ask: "How many diarrhea stools were passed in past 24 hours?"      No diarrhea 6. CONSTIPATION: "Do you have constipation?" If so, "How bad is it?"     No 7. RECURRENT SYMPTOMS: "Have you had blood in your stools before?" If so, ask: "When was the last time?" and "What happened that time?"      Yes 8. BLOOD THINNERS: "Do you take any blood thinners?" (e.g., Coumadin/warfarin, Pradaxa/dabigatran, aspirin)     Plavix 9. OTHER SYMPTOMS: "Do you have any other symptoms?"  (e.g., abdominal pain, vomiting, dizziness, fever)     Gas pain 10. PREGNANCY: "Is there any chance you are pregnant?" "When was your last menstrual period?"       n/a  Protocols used: RECTAL BLEEDING-A-AH

## 2018-07-25 ENCOUNTER — Encounter: Payer: Self-pay | Admitting: Adult Health

## 2018-07-25 ENCOUNTER — Ambulatory Visit: Payer: Medicare Other | Admitting: Adult Health

## 2018-07-25 VITALS — BP 142/80 | Temp 98.6°F | Wt 200.0 lb

## 2018-07-25 DIAGNOSIS — K922 Gastrointestinal hemorrhage, unspecified: Secondary | ICD-10-CM

## 2018-07-25 LAB — BASIC METABOLIC PANEL
BUN: 27 mg/dL — AB (ref 6–23)
CALCIUM: 9.2 mg/dL (ref 8.4–10.5)
CO2: 27 mEq/L (ref 19–32)
CREATININE: 2.49 mg/dL — AB (ref 0.40–1.50)
Chloride: 105 mEq/L (ref 96–112)
GFR: 32.12 mL/min — AB (ref >60.00)
Glucose, Bld: 117 mg/dL — ABNORMAL HIGH (ref 70–99)
POTASSIUM: 3.1 meq/L — AB (ref 3.5–5.1)
Sodium: 142 mEq/L (ref 135–145)

## 2018-07-25 LAB — CBC WITH DIFFERENTIAL/PLATELET
BASOS PCT: 0.5 % (ref 0.0–3.0)
Basophils Absolute: 0 10*3/uL (ref 0.0–0.1)
Eosinophils Absolute: 0.1 10*3/uL (ref 0.0–0.7)
Eosinophils Relative: 1.6 % (ref 0.0–5.0)
HCT: 36 % — ABNORMAL LOW (ref 39.0–52.0)
Hemoglobin: 11.9 g/dL — ABNORMAL LOW (ref 13.0–17.0)
LYMPHS PCT: 14.3 % (ref 12.0–46.0)
Lymphs Abs: 1 10*3/uL (ref 0.7–4.0)
MCHC: 33 g/dL (ref 30.0–36.0)
MCV: 79.3 fl (ref 78.0–100.0)
Monocytes Absolute: 0.5 10*3/uL (ref 0.1–1.0)
Monocytes Relative: 6.9 % (ref 3.0–12.0)
NEUTROS ABS: 5.3 10*3/uL (ref 1.4–7.7)
Neutrophils Relative %: 76.7 % (ref 43.0–77.0)
PLATELETS: 305 10*3/uL (ref 150.0–400.0)
RBC: 4.54 Mil/uL (ref 4.22–5.81)
RDW: 16 % — AB (ref 11.5–15.5)
WBC: 6.9 10*3/uL (ref 4.0–10.5)

## 2018-07-25 MED ORDER — HYDROCORTISONE ACETATE 25 MG RE SUPP: 25.0000 mg | Freq: Two times a day (BID) | RECTAL | 0 refills | Status: DC

## 2018-07-25 NOTE — Progress Notes (Signed)
Subjective:    Patient ID: Austin [name], male    DOB: 05-10-1935, 83 y.o.   MRN: [id number]  HPI 83 year old male who  has a past medical history of ANEMIA DUE TO CHRONIC BLOOD LOSS (03/13/2007), CAROTID ARTERY STENOSIS (05/10/2010), DIABETES MELLITUS, TYPE II (09/19/2007), DISEASE, CEREBROVASCULAR NEC (03/05/2007), GERD (03/13/2007), HYPERLIPIDEMIA (03/05/2007), HYPERTENSION (03/05/2007), HYPOKALEMIA (11/09/2009), KNEE PAIN, RIGHT (11/09/2009), PROSTATE CANCER, HX OF (03/05/2007), and RENAL DISEASE, CHRONIC (02/03/2009).  He presents to the office today for an acute issue of rectal bleeding. Per patient his symptoms started about 1.5 weeks ago. He reports that for the last four or five days he has had dark red blood in his stool and dark blood clots in the bowl with every BM. When he wipes he has a mixture of bright red blood and dark red blood. He is on Plavix but has not taken this for the last two days. This morning he noticed less blood.   He denies ractal pain or itching. Has gas pain but to abdominal cramping, n/v/d.   Reports hard stools.   Denies GERD symptoms but takes Nexium   Had endoscopy in 2018 with multiple polyps. Last colonoscopy in 2008 with diverticulosis.   Review of Systems See HPI   Past Medical History:  Diagnosis Date  . 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 CAROTID ARTERY STENOSIS 05/10/2010  . DIABETES MELLITUS, TYPE II 09/19/2007  . DISEASE, CEREBROVASCULAR NEC 03/05/2007  . GERD 03/13/2007  . HYPERLIPIDEMIA 03/05/2007  . HYPERTENSION 03/05/2007  . HYPOKALEMIA 11/09/2009  . KNEE PAIN, RIGHT 11/09/2009  . PROSTATE CANCER, HX OF 03/05/2007  . RENAL DISEASE, CHRONIC 02/03/2009    Social History   Socioeconomic History  . Marital status: Married    Spouse name: Not on file  . Number of children: Not on file  . Years of education: Not on file  . Highest education level: Not on file  Occupational History  . Not on file  Social Needs  . Financial resource strain:  Not on file  . Food insecurity:    Worry: Not on file    Inability: Not on file  . Transportation needs:    Medical: Not on file    Non-medical: Not on file  Tobacco Use  . Smoking status: Former Smoker    Types: Cigarettes    Last attempt to quit: 08/22/1974    Years since quitting: 43.9  . Smokeless tobacco: Never Used  Substance and Sexual Activity  . Alcohol use: No    Alcohol/week: 0.0 standard drinks  . Drug use: No  . Sexual activity: Not on file  Lifestyle  . Physical activity:    Days per week: Not on file    Minutes per session: Not on file  . Stress: Not on file  Relationships  . Social connections:    Talks on phone: Not on file    Gets together: Not on file    Attends religious service: Not on file    Active member of club or organization: Not on file    Attends meetings of clubs or organizations: Not on file    Relationship status: Not on file  . Intimate partner violence:    Fear of current or ex partner: Not on file    Emotionally abused: Not on file    Physically abused: Not on file    Forced sexual activity: Not on file  Other Topics Concern  . Not on file  Social History  Narrative   Retired Editor, commissioning    Married 38 years       He enjoys traveling     Past Surgical History:  Procedure Laterality Date  . CAROTID ARTERY ANGIOPLASTY Right Oct. 10, 2001  . ESOPHAGOGASTRODUODENOSCOPY (EGD) WITH PROPOFOL N/A 11/04/2016   Procedure: ESOPHAGOGASTRODUODENOSCOPY (EGD) WITH PROPOFOL;  Surgeon: Otis Brace, MD;  Location: [place];  Service: Gastroenterology;  Laterality: N/A;  . LAPAROSCOPIC APPENDECTOMY  02/20/2012   Procedure: APPENDECTOMY LAPAROSCOPIC;  Surgeon: Stark Klein, MD;  Location: [facility];  Service: General;  Laterality: N/A;  . PROSTATE SURGERY     prostatectomy    Family History  Problem Relation Age of Onset  . Hypertension Mother   . Cancer Father        Mesothelioma   . Stomach cancer Brother   . Cancer Brother   .  Cancer - Cervical Brother   . Cancer Brother   . Diabetes Brother     Allergies  Allergen Reactions  . Aspirin Other (See Comments)    High doses causes stomach ulcer and bleeding    Current Outpatient Medications on File Prior to Visit  Medication Sig Dispense Refill  . amLODipine (NORVASC) 10 MG tablet Take 1 tablet (10 mg total) by mouth daily. 90 tablet 3  . atorvastatin (LIPITOR) 20 MG tablet Take 1 tablet (20 mg total) by mouth daily. 90 tablet 3  . doxazosin (CARDURA) 2 MG tablet TAKE 1 TABLET BY MOUTH EVERYDAY AT BEDTIME  3  . esomeprazole (NEXIUM) 40 MG capsule TAKE 1 CAPSULE BY MOUTH EVERY DAY 90 capsule 3  . GLIPIZIDE XL 5 MG 24 hr tablet TAKE 1 TABLET BY MOUTH EVERY DAY 90 tablet 3  . hydrocortisone (ANUSOL-HC) 2.5 % rectal cream Place 1 application rectally 2 (two) times daily. 30 g 0  . isosorbide mononitrate (IMDUR) 30 MG 24 hr tablet Take 30 mg by mouth every morning.  3  . losartan (COZAAR) 100 MG tablet TAKE 1 TABLET BY MOUTH EVERY DAY 90 tablet 3  . metoprolol succinate (TOPROL-XL) 25 MG 24 hr tablet TAKE 2 TABLETS (50 MG TOTAL) BY MOUTH DAILY. 90 tablet 4  . nystatin cream (MYCOSTATIN) APPLY DIRECTLY ON DENTURE TWICE DAILY  1  . ONETOUCH DELICA LANCETS 14G MISC USE TO CHECK BLOOD SUGAR DAILY AND AS NEEDED 200 each 1  . ONETOUCH VERIO test strip USE TO CHECK BLOOD SUGAR DAILY AND AS NEEDED 200 each 0  . pramoxine-hydrocortisone (PROCTOCREAM-HC) 1-1 % rectal cream Place 1 application rectally 2 (two) times daily. 30 g 4  . clopidogrel (PLAVIX) 75 MG tablet TAKE 1 TABLET EVERY DAY (Patient not taking: Reported on 07/25/2018) 90 tablet 3   No current facility-administered medications on file prior to visit.     BP (!) 142/80   Temp 98.6 F (37 C)   Wt 200 lb (90.7 kg)   BMI 30.86 kg/m       Objective:   Physical Exam  Constitutional: He is oriented to person, place, and time. He appears well-developed and well-nourished. No distress.  Cardiovascular: Normal  rate, regular rhythm, normal heart sounds and intact distal pulses.  Pulmonary/Chest: Effort normal and breath sounds normal.  Abdominal: Soft. Bowel sounds are normal. He exhibits no distension and no mass. There is no tenderness. There is no rebound and no guarding. No hernia.  Genitourinary: Rectal exam shows internal hemorrhoid and guaiac positive stool. Rectal exam shows anal tone normal.  Neurological: He is alert and oriented  to person, place, and time.  Skin: Skin is warm and dry. Capillary refill takes less than 2 seconds. He is not diaphoretic.  Psychiatric: He has a normal mood and affect. His behavior is normal. Thought content normal.  Nursing note and vitals reviewed.     Assessment & Plan:   1. Gastrointestinal hemorrhage, unspecified gastrointestinal hemorrhage type - Small internal hemorrhoid noted. Will send in Anusol suppository. Will send to GI for further evaluation due to blood clots and dark red blood in stool. He will stay off Plavix for another day then restart.  - CBC with Differential/Platelet - Ambulatory referral to Gastroenterology - Basic Metabolic Panel - hydrocortisone (ANUSOL-HC) 25 MG suppository; Place 1 suppository (25 mg total) rectally 2 (two) times daily.  Dispense: 12 suppository; Refill: 0 - Return precautions addressed.   Dorothyann Peng, NP

## 2018-07-31 ENCOUNTER — Ambulatory Visit: Payer: Self-pay

## 2018-07-31 ENCOUNTER — Other Ambulatory Visit: Payer: Self-pay | Admitting: Family Medicine

## 2018-07-31 DIAGNOSIS — N184 Chronic kidney disease, stage 4 (severe): Secondary | ICD-10-CM

## 2018-07-31 NOTE — Telephone Encounter (Signed)
Pt called to report that he has rectal bleeding.  He states he was seen in the office last week for the same and given a rectal cream to use but is still having symptoms. He states he has some small clots but only sees blood with his stools once per day. He states his abdomin has been tight with gas. He denies dizziness weakness. Pt reports that his last colonoscopy was in 2008. He states he had an endoscopy 3/4 years ago and they found polyps in his stomach that were bleeding. Appointment scheduled with physician in the practice per patient request. Care advice read to patient. Pt verbalized understanding of all instructions  Reason for Disposition . MODERATE rectal bleeding (small blood clots, passing blood without stool, or toilet water turns red)  Answer Assessment - Initial Assessment Questions 1. APPEARANCE of BLOOD: "What color is it?" "Is it passed separately, on the surface of the stool, or mixed in with the stool?"      Bright red 2. AMOUNT: "How much blood was passed?"      With BM mixed with stool 3. FREQUENCY: "How many times has blood been passed with the stools?"     1 4. ONSET: "When was the blood first seen in the stools?" (Days or weeks)      1st of December with antibiotic use 5. DIARRHEA: "Is there also some diarrhea?" If so, ask: "How many diarrhea stools were passed in past 24 hours?"      no 6. CONSTIPATION: "Do you have constipation?" If so, "How bad is it?"     no 7. RECURRENT SYMPTOMS: "Have you had blood in your stools before?" If so, ask: "When was the last time?" and "What happened that time?"      Yes 2/3 years ago found polyps in stomach 8. BLOOD THINNERS: "Do you take any blood thinners?" (e.g., Coumadin/warfarin, Pradaxa/dabigatran, aspirin)     plavix 9. OTHER SYMPTOMS: "Do you have any other symptoms?"  (e.g., abdominal pain, vomiting, dizziness, fever)     No  Tight abdomin 10. PREGNANCY: "Is there any chance you are pregnant?" "When was your last menstrual  period?"       NA  Protocols used: RECTAL BLEEDING-A-AH

## 2018-08-01 ENCOUNTER — Other Ambulatory Visit: Payer: Self-pay

## 2018-08-01 ENCOUNTER — Ambulatory Visit: Payer: Medicare Other | Admitting: Family Medicine

## 2018-08-01 ENCOUNTER — Encounter: Payer: Self-pay | Admitting: Family Medicine

## 2018-08-01 VITALS — BP 146/70 | HR 78 | Temp 98.2°F | Wt 198.7 lb

## 2018-08-01 DIAGNOSIS — K5900 Constipation, unspecified: Secondary | ICD-10-CM

## 2018-08-01 DIAGNOSIS — K625 Hemorrhage of anus and rectum: Secondary | ICD-10-CM | POA: Diagnosis not present

## 2018-08-01 LAB — CBC WITH DIFFERENTIAL/PLATELET
BASOS ABS: 0 10*3/uL (ref 0.0–0.1)
Basophils Relative: 0.6 % (ref 0.0–3.0)
EOS ABS: 0.1 10*3/uL (ref 0.0–0.7)
Eosinophils Relative: 1.2 % (ref 0.0–5.0)
HEMATOCRIT: 34.8 % — AB (ref 39.0–52.0)
HEMOGLOBIN: 11.5 g/dL — AB (ref 13.0–17.0)
LYMPHS ABS: 1.2 10*3/uL (ref 0.7–4.0)
LYMPHS PCT: 16.7 % (ref 12.0–46.0)
MCHC: 33.1 g/dL (ref 30.0–36.0)
MCV: 79.5 fl (ref 78.0–100.0)
MONO ABS: 0.4 10*3/uL (ref 0.1–1.0)
Monocytes Relative: 5.9 % (ref 3.0–12.0)
NEUTROS PCT: 75.6 % (ref 43.0–77.0)
Neutro Abs: 5.6 10*3/uL (ref 1.4–7.7)
Platelets: 329 10*3/uL (ref 150.0–400.0)
RBC: 4.38 Mil/uL (ref 4.22–5.81)
RDW: 15.6 % — AB (ref 11.5–15.5)
WBC: 7.4 10*3/uL (ref 4.0–10.5)

## 2018-08-01 NOTE — Patient Instructions (Signed)
Continue to hold Plavix for now  Stay well hydrated  Keep GI follow up.  Follow up for any dizziness or increased abdominal pain.  May try some Colace (stool softener) for any straining.

## 2018-08-01 NOTE — Progress Notes (Signed)
Subjective:     Patient ID: Austin [name], male   DOB: 21-Nov-1934, 83 y.o.   MRN: [id number]  HPI Patient is seen for follow-up regarding recent lower GI bleeding symptoms.  He was seen here a week ago by his primary with reported 1-1/2-week history of some rectal bleeding.  His bleeding has been somewhat intermittent.  He initially noticed some dark red blood and occasionally clots and more bright past couple of days.  He had been taking Plavix until recently but is been off for about 3 or 4 days now.  Hemoglobin a week ago was 11.9  He had colonoscopy 2008 with right-sided diverticulosis but no polyps.  Denies any melena.  No abdominal pain.  No recent reported appetite or weight changes.  He has had some recent constipation and straining with stools.  He was noted to have some mild hemorrhoids on visit last week.  Denies any dizziness.  No known history of diverticulosis bleed.  He has pending follow-up with GI on Friday.  Past Medical History:  Diagnosis Date  . 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 CAROTID ARTERY STENOSIS 05/10/2010  . DIABETES MELLITUS, TYPE II 09/19/2007  . DISEASE, CEREBROVASCULAR NEC 03/05/2007  . GERD 03/13/2007  . HYPERLIPIDEMIA 03/05/2007  . HYPERTENSION 03/05/2007  . HYPOKALEMIA 11/09/2009  . KNEE PAIN, RIGHT 11/09/2009  . PROSTATE CANCER, HX OF 03/05/2007  . RENAL DISEASE, CHRONIC 02/03/2009   Past Surgical History:  Procedure Laterality Date  . CAROTID ARTERY ANGIOPLASTY Right Oct. 10, 2001  . ESOPHAGOGASTRODUODENOSCOPY (EGD) WITH PROPOFOL N/A 11/04/2016   Procedure: ESOPHAGOGASTRODUODENOSCOPY (EGD) WITH PROPOFOL;  Surgeon: Otis Brace, MD;  Location: [place];  Service: Gastroenterology;  Laterality: N/A;  . LAPAROSCOPIC APPENDECTOMY  02/20/2012   Procedure: APPENDECTOMY LAPAROSCOPIC;  Surgeon: Stark Klein, MD;  Location: [place];  Service: General;  Laterality: N/A;  . PROSTATE SURGERY     prostatectomy    reports that he quit smoking about 43  years ago. His smoking use included cigarettes. He has never used smokeless tobacco. He reports that he does not drink alcohol or use drugs. family history includes Cancer in his brother, brother, and father; Cancer - Cervical in his brother; Diabetes in his brother; Hypertension in his mother; Stomach cancer in his brother. Allergies  Allergen Reactions  . Aspirin Other (See Comments)    High doses causes stomach ulcer and bleeding    Review of Systems  Constitutional: Negative for chills and fever.  Respiratory: Negative for shortness of breath.   Gastrointestinal: Positive for blood in stool and constipation. Negative for abdominal pain, nausea and vomiting.  Neurological: Negative for dizziness and syncope.       Objective:   Physical Exam  Constitutional: He is oriented to person, place, and time. He appears well-developed and well-nourished.  Cardiovascular: Normal rate and regular rhythm.  Pulmonary/Chest: Effort normal and breath sounds normal.  Abdominal: Soft. There is no tenderness.  Neurological: He is alert and oriented to person, place, and time.       Assessment:     Patient presents with couple week history of some intermittent rectal bleeding.  He does not have any red flags such as appetite or weight changes.  Recent hemoglobin 11.9.  No dizziness.  Pending follow-up with GI this Friday.  Did not repeat digital exam today since this was done one week ago    Plan:     -Stay well-hydrated and continue to hold Plavix for now -Keep GI follow-up as  scheduled -Recheck CBC today to assess stability of hemoglobin -Follow-up immediately for any dizziness, increased abdominal pain, or other concerns -suggested he try OTC stool softener such as Colace.  Eulas Post MD  Primary Care at [facility]

## 2018-08-02 ENCOUNTER — Other Ambulatory Visit: Payer: Self-pay | Admitting: Family Medicine

## 2018-08-02 DIAGNOSIS — N289 Disorder of kidney and ureter, unspecified: Secondary | ICD-10-CM

## 2018-08-02 NOTE — Progress Notes (Signed)
amb  

## 2018-08-02 NOTE — Progress Notes (Signed)
Subjective:     Patient ID: Austin [name], male   DOB: [birth year], 83 y.o.   MRN: [id number]  Rectal Bleeding   Pertinent negatives include no fever, no abdominal pain, no nausea and no vomiting.   Patient is seen for follow-up regarding recent lower GI bleeding symptoms.  He was seen here a week ago by his primary with reported 1-1/2-week history of some rectal bleeding.  His bleeding has been somewhat intermittent.  He initially noticed some dark red blood and occasionally clots and more bright past couple of days.  He had been taking Plavix until recently but is been off for about 3 or 4 days now.  Hemoglobin a week ago was 11.9  He had colonoscopy 2008 with right-sided diverticulosis but no polyps.  Denies any melena.  No abdominal pain.  No recent reported appetite or weight changes.  He has had some recent constipation and straining with stools.  He was noted to have some mild hemorrhoids on visit last week.  Denies any dizziness.  No known history of diverticulosis bleed.  He has pending follow-up with GI on Friday.  Past Medical History:  Diagnosis Date  . 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 CAROTID ARTERY STENOSIS 05/10/2010  . DIABETES MELLITUS, TYPE II 09/19/2007  . DISEASE, CEREBROVASCULAR NEC 03/05/2007  . GERD 03/13/2007  . HYPERLIPIDEMIA 03/05/2007  . HYPERTENSION 03/05/2007  . HYPOKALEMIA 11/09/2009  . KNEE PAIN, RIGHT 11/09/2009  . PROSTATE CANCER, HX OF 03/05/2007  . RENAL DISEASE, CHRONIC 02/03/2009   Past Surgical History:  Procedure Laterality Date  . CAROTID ARTERY ANGIOPLASTY Right Oct. 10, 2001  . ESOPHAGOGASTRODUODENOSCOPY (EGD) WITH PROPOFOL N/A 11/04/2016   Procedure: ESOPHAGOGASTRODUODENOSCOPY (EGD) WITH PROPOFOL;  Surgeon: Otis Brace, MD;  Location: [place];  Service: Gastroenterology;  Laterality: N/A;  . LAPAROSCOPIC APPENDECTOMY  02/20/2012   Procedure: APPENDECTOMY LAPAROSCOPIC;  Surgeon: Stark Klein, MD;  Location: [place];  Service: General;   Laterality: N/A;  . PROSTATE SURGERY     prostatectomy    reports that he quit smoking about 43 years ago. His smoking use included cigarettes. He has never used smokeless tobacco. He reports that he does not drink alcohol or use drugs. family history includes Cancer in his brother, brother, and father; Cancer - Cervical in his brother; Diabetes in his brother; Hypertension in his mother; Stomach cancer in his brother. Allergies  Allergen Reactions  . Aspirin Other (See Comments)    High doses causes stomach ulcer and bleeding    Review of Systems  Constitutional: Negative for chills and fever.  Respiratory: Negative for shortness of breath.   Gastrointestinal: Positive for blood in stool, constipation and hematochezia. Negative for abdominal pain, nausea and vomiting.  Neurological: Negative for dizziness and syncope.       Objective:   Physical Exam Constitutional:      Appearance: He is well-developed and well-nourished.  Cardiovascular:     Rate and Rhythm: Normal rate and regular rhythm.  Pulmonary:     Effort: Pulmonary effort is normal.     Breath sounds: Normal breath sounds.  Abdominal:     Palpations: Abdomen is soft.     Tenderness: There is no abdominal tenderness.  Neurological:     Mental Status: He is alert and oriented to person, place, and time.        Assessment:     Patient presents with couple week history of some intermittent rectal bleeding.  He does not have any red flags  such as appetite or weight changes.  Recent hemoglobin 11.9.  No dizziness.  Pending follow-up with GI this Friday.  Did not repeat digital exam today since this was done one week ago    Plan:     -Stay well-hydrated and continue to hold Plavix for now -Keep GI follow-up as scheduled -Recheck CBC today to assess stability of hemoglobin -Follow-up immediately for any dizziness, increased abdominal pain, or other concerns -suggested he try OTC stool softener such as  Colace.  Eulas Post MD [facility]

## 2018-08-03 ENCOUNTER — Ambulatory Visit: Payer: Medicare Other | Admitting: Gastroenterology

## 2018-08-03 ENCOUNTER — Encounter: Payer: Self-pay | Admitting: Gastroenterology

## 2018-08-03 VITALS — BP 186/60 | HR 90 | Ht 67.5 in | Wt 198.1 lb

## 2018-08-03 DIAGNOSIS — K625 Hemorrhage of anus and rectum: Secondary | ICD-10-CM

## 2018-08-03 NOTE — Progress Notes (Signed)
HPI: This is a very pleasant 83 year old man who was referred to me by Dorothyann Peng, NP  to evaluate GI bleeding.    Chief complaint is rectal bleeding  Stopped plavix 4 days ago.  He had a right neck infected cyst, given abx (2 weeks).  He's been quite constipated since then.  That was 3 weeks ago. Has been having to push, strain a lot.  Has had bright red blood.  No bleeding in two days.  Has been gassy.    Lately less straining in past few days.  Tried one chobani yogurt.  He used a suppository (for some swelling at his anus and he believes the swelling is much improved.)  Before this change he had BMs once daily without much straining.    Old Data Reviewed:  Blood work recently:  CBC 2 days ago showed a hemoglobin of 11.5.  8 months ago his hemoglobin was 11.  1 year ago his hemoglobin was between 8 and 9.  Basic metabolic profile last week showed a potassium of 3.1, BUN 27 creatinine 2.5.  His creatinine 1 year ago was 1.8.   He had an upper endoscopy March 2018 with [facility] gastroenterology for melena.  He had some relatively small polyps in his stomach.  1 with an adherent blood clot.  The 2 polyps were removed with hot snare.  His previous gastroenterologist documented that 1 of the polyps was only "incompletely removed".  That endoscopy report makes mention of office follow-up in a month and likely repeat upper endoscopy from there.  He did have office follow up and then had a second procedure, EGD.  Colonoscopy June 2008 Dr. Verl Blalock for routine screening found diverticulosis but was otherwise normal.    Review of systems: Pertinent positive and negative review of systems were noted in the above HPI section. All other review negative.   Past Medical History:  Diagnosis Date  . 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 CAROTID ARTERY STENOSIS 05/10/2010  . DIABETES MELLITUS, TYPE II 09/19/2007  . DISEASE, CEREBROVASCULAR NEC 03/05/2007  . GERD 03/13/2007  .  HYPERLIPIDEMIA 03/05/2007  . HYPERTENSION 03/05/2007  . HYPOKALEMIA 11/09/2009  . KNEE PAIN, RIGHT 11/09/2009  . PROSTATE CANCER, HX OF 03/05/2007  . RENAL DISEASE, CHRONIC 02/03/2009    Past Surgical History:  Procedure Laterality Date  . CAROTID ARTERY ANGIOPLASTY Right Oct. 10, 2001  . ESOPHAGOGASTRODUODENOSCOPY (EGD) WITH PROPOFOL N/A 11/04/2016   Procedure: ESOPHAGOGASTRODUODENOSCOPY (EGD) WITH PROPOFOL;  Surgeon: Otis Brace, MD;  Location: [place];  Service: Gastroenterology;  Laterality: N/A;  . LAPAROSCOPIC APPENDECTOMY  02/20/2012   Procedure: APPENDECTOMY LAPAROSCOPIC;  Surgeon: Stark Klein, MD;  Location: [place];  Service: General;  Laterality: N/A;  . PROSTATE SURGERY     prostatectomy    Current Outpatient Medications  Medication Sig Dispense Refill  . amLODipine (NORVASC) 10 MG tablet Take 1 tablet (10 mg total) by mouth daily. 90 tablet 3  . atorvastatin (LIPITOR) 20 MG tablet Take 1 tablet (20 mg total) by mouth daily. 90 tablet 3  . doxazosin (CARDURA) 2 MG tablet TAKE 1 TABLET BY MOUTH EVERYDAY AT BEDTIME  3  . esomeprazole (NEXIUM) 40 MG capsule TAKE 1 CAPSULE BY MOUTH EVERY DAY 90 capsule 3  . GLIPIZIDE XL 5 MG 24 hr tablet TAKE 1 TABLET BY MOUTH EVERY DAY 90 tablet 3  . hydrocortisone (ANUSOL-HC) 2.5 % rectal cream Place 1 application rectally 2 (two) times daily. 30 g 0  . isosorbide  mononitrate (IMDUR) 30 MG 24 hr tablet Take 30 mg by mouth every morning.  3  . losartan (COZAAR) 100 MG tablet TAKE 1 TABLET BY MOUTH EVERY DAY 90 tablet 3  . metoprolol succinate (TOPROL-XL) 25 MG 24 hr tablet TAKE 2 TABLETS (50 MG TOTAL) BY MOUTH DAILY. 90 tablet 4  . nystatin cream (MYCOSTATIN) APPLY DIRECTLY ON DENTURE TWICE DAILY  1  . ONETOUCH DELICA LANCETS 88F MISC USE TO CHECK BLOOD SUGAR DAILY AND AS NEEDED 200 each 1  . ONETOUCH VERIO test strip USE TO CHECK BLOOD SUGAR DAILY AND AS NEEDED 200 each 0  . pramoxine-hydrocortisone (PROCTOCREAM-HC) 1-1 % rectal cream Place  1 application rectally 2 (two) times daily. 30 g 4  . clopidogrel (PLAVIX) 75 MG tablet TAKE 1 TABLET EVERY DAY (Patient not taking: Reported on 08/03/2018) 90 tablet 3   No current facility-administered medications for this visit.     Allergies as of 08/03/2018 - Review Complete 08/03/2018  Allergen Reaction Noted  . Aspirin Other (See Comments) 02/20/2012    Family History  Problem Relation Age of Onset  . Hypertension Mother   . Cancer Father        Mesothelioma   . Stomach cancer Brother   . Cancer Brother   . Cancer - Cervical Brother   . Cancer Brother   . Diabetes Brother   . Esophageal cancer Neg Hx     Social History   Socioeconomic History  . Marital status: Married    Spouse name: Not on file  . Number of children: Not on file  . Years of education: Not on file  . Highest education level: Not on file  Occupational History  . Not on file  Social Needs  . Financial resource strain: Not on file  . Food insecurity:    Worry: Not on file    Inability: Not on file  . Transportation needs:    Medical: Not on file    Non-medical: Not on file  Tobacco Use  . Smoking status: Former Smoker    Types: Cigarettes    Last attempt to quit: 08/22/1974    Years since quitting: 43.9  . Smokeless tobacco: Never Used  Substance and Sexual Activity  . Alcohol use: No    Alcohol/week: 0.0 standard drinks  . Drug use: No  . Sexual activity: Not on file  Lifestyle  . Physical activity:    Days per week: Not on file    Minutes per session: Not on file  . Stress: Not on file  Relationships  . Social connections:    Talks on phone: Not on file    Gets together: Not on file    Attends religious service: Not on file    Active member of club or organization: Not on file    Attends meetings of clubs or organizations: Not on file    Relationship status: Not on file  . Intimate partner violence:    Fear of current or ex partner: Not on file    Emotionally abused: Not on  file    Physically abused: Not on file    Forced sexual activity: Not on file  Other Topics Concern  . Not on file  Social History Narrative   Retired - Environmental consultant    Married 53 years       He enjoys traveling      Physical Exam: BP (!) 186/60   Pulse 90   Ht 5' 7.5" (1.715 m)  Wt 198 lb 2 oz (89.9 kg)   BMI 30.57 kg/m  Constitutional: generally well-appearing Psychiatric: alert and oriented x3 Eyes: extraocular movements intact Mouth: oral pharynx moist, no lesions Neck: supple no lymphadenopathy Cardiovascular: heart regular rate and rhythm Lungs: clear to auscultation bilaterally Abdomen: soft, nontender, nondistended, no obvious ascites, no peritoneal signs, normal bowel sounds Extremities: no lower extremity edema bilaterally Skin: no lesions on visible extremities Rectal examination: Deflated external hemorrhoid tissue, internally there seem to be some swollen internal hemorrhoids.  No obvious masses, stool was very scant and not checked for Hemoccult.  Assessment and plan: 83 y.o. male with minor rectal bleeding after constipation and bowel changes  First we will get records from his [facility] gastroenterologist sent here for review.  He had been seeing them last year in the office.  Second this seems to be very minor rectal bleeding.  I suspect the antibiotics that he took for his right neck infection caused some difficulties with his bowels, constipation and pushing and straining.  This led to some bleeding from some hemorrhoids.  The bleeding has clearly stopped.  The hemorrhoids are improving and his bowels are getting back to normal.  I recommended he can resume his Plavix tomorrow.  I also recommended he try daily fiber supplement with Citrucel to try to keep his bowels more even, keep from pushing and straining.  He will return to see me in 2 to 3 months and sooner if needed.  We did discuss colonoscopy since it is been more than 10 years since his last one.   Given the clinical situation I do not think it is absolutely necessary and he is not very interested either.    Please see the "Patient Instructions" section for addition details about the plan.   Owens Loffler, MD [facility] 08/03/2018, 9:26 AM  Cc: Dorothyann Peng, NP

## 2018-08-03 NOTE — Patient Instructions (Addendum)
We will get records sent from your previous gastroenterologist (at [facility])  for review.  This will include any endoscopic (colonoscopy or upper endoscopy) procedures and any associated pathology reports.    OK to restart plavix tomorrow.  Please start taking citrucel (orange flavored) powder fiber supplement.  This may cause some bloating at first but that usually goes away. Begin with a small spoonful and work your way up to a large, heaping spoonful daily over a week.   Please return to see Dr. Ardis Hughs in 2-3 months. Our office will contact you.  Thank you for entrusting me with your care and choosing [facility].  Dr Ardis Hughs

## 2018-08-17 ENCOUNTER — Telehealth: Payer: Self-pay | Admitting: Gastroenterology

## 2018-08-17 NOTE — Telephone Encounter (Signed)
    EGD March 2018, [facility] gastroenterology, Dr. Alessandra Bevels.  Indication melena.  Findings polyps in the stomach one was felt to be the source of his bleeding because it had a blood clot on it.  Incompletely removed.  Biopsies from that polyp showed hyperplastic polyps.  Random biopsies from the stomach showed chronic gastritis without H. pylori.    EGD June 2018, Dr. Alessandra Bevels, indication follow-up of gastric polyps.  7 subcentimeter polyps were noted.  They were all removed with snare cautery.  Pathology from these polyps showed fundic gland and hyperplastic polyps.  Again negative for H. pylori.

## 2018-09-29 ENCOUNTER — Other Ambulatory Visit: Payer: Self-pay | Admitting: Internal Medicine

## 2018-10-30 MED ORDER — ATORVASTATIN CALCIUM 20 MG PO TABS: 20.0000 mg | ORAL_TABLET | Freq: Every day | ORAL | 0 refills | Status: DC

## 2018-10-30 MED ORDER — AMLODIPINE BESYLATE 10 MG PO TABS: 10.0000 mg | ORAL_TABLET | Freq: Every day | ORAL | 0 refills | Status: DC

## 2018-10-30 NOTE — Telephone Encounter (Signed)
Ok to refill for 90 days. He is due for a CPE at anytime

## 2018-10-30 NOTE — Addendum Note (Signed)
Addended by: Miles Costain T on: 10/30/2018 04:33 PM   Modules accepted: Orders

## 2018-10-30 NOTE — Telephone Encounter (Signed)
Pt calling to check status. Look like this was sent to DR K. Please advise

## 2018-10-30 NOTE — Telephone Encounter (Signed)
Sent to the pharmacy by e-scribe for 90 days.  Called the pt and left a message on cel that medications have been sent in and that it is time for annual visit.  Advised he call back to schedule an appointment.  Nothing further needed.

## 2018-10-30 NOTE — Telephone Encounter (Signed)
When should pt have cpx?

## 2018-11-02 ENCOUNTER — Other Ambulatory Visit: Payer: Self-pay | Admitting: Internal Medicine

## 2018-11-13 ENCOUNTER — Telehealth: Payer: Self-pay | Admitting: Adult Health

## 2018-11-13 MED ORDER — HYDROCORTISONE 2.5 % RE CREA: 1.0000 "application " | TOPICAL_CREAM | Freq: Two times a day (BID) | RECTAL | 3 refills | Status: DC

## 2018-11-13 NOTE — Telephone Encounter (Signed)
Copied from [place] [id number]. Topic: Quick Communication - Rx Refill/Question >> Nov 13, 2018 12:44 PM Ahmed Prima L wrote: Medication: hydrocortisone (ANUSOL-HC) 2.5 % rectal cream & losartan (COZAAR) 100 MG tablet  Has the patient contacted their pharmacy? Yes they keep sending to Dr Raliegh Ip (Agent: If no, request that the patient contact the pharmacy for the refill.) (Agent: If yes, when and what did the pharmacy advise?)  Preferred Pharmacy (with phone number or street name): [id number] - [place], [place]. AT [address] [place] [address] Phone: [phone] Fax: [phone]    Agent: Please be advised that RX refills may take up to 3 business days. We ask that you follow-up with your pharmacy.

## 2018-11-13 NOTE — Addendum Note (Signed)
Addended by: Apolinar Junes on: 11/13/2018 05:18 PM   Modules accepted: Orders

## 2018-11-19 ENCOUNTER — Other Ambulatory Visit: Payer: Self-pay

## 2018-11-19 MED ORDER — LOSARTAN POTASSIUM 100 MG PO TABS: 100.0000 mg | ORAL_TABLET | Freq: Every day | ORAL | 0 refills | Status: DC

## 2018-11-22 ENCOUNTER — Telehealth: Payer: Self-pay

## 2018-11-22 NOTE — Telephone Encounter (Signed)
Author phoned pt. to assess interest in scheduling virtual awv.  Pt. Stated he was not interested in doing virtual but would be willing to do in-office with HC later in the year. Appointment created.

## 2018-12-13 ENCOUNTER — Other Ambulatory Visit: Payer: Self-pay | Admitting: Adult Health

## 2018-12-13 MED ORDER — GLIPIZIDE ER 5 MG PO TB24: 5.0000 mg | ORAL_TABLET | Freq: Every day | ORAL | 0 refills | Status: DC

## 2018-12-13 NOTE — Telephone Encounter (Signed)
Requested Prescriptions  Pending Prescriptions Disp Refills  . glipiZIDE (GLIPIZIDE XL) 5 MG 24 hr tablet 90 tablet 0    Sig: Take 1 tablet (5 mg total) by mouth daily.     Endocrinology:  Diabetes - Sulfonylureas Failed - 12/13/2018  1:17 PM      Failed - HBA1C is between 0 and 7.9 and within 180 days    Hemoglobin A1C  Date Value Ref Range Status  03/22/2018 6.6 (A) 4.0 - 5.6 % Final   Hgb A1c MFr Bld  Date Value Ref Range Status  01/09/2017 6.8 (H) 4.6 - 6.5 % Final    Comment:    Glycemic Control Guidelines for People with Diabetes:Non Diabetic:  <6%Goal of Therapy: <7%Additional Action Suggested:  >8%          Passed - Valid encounter within last 6 months    Recent Outpatient Visits          4 months ago Rectal bleeding   [facility] at [facility], Alinda Sierras, MD   4 months ago Gastrointestinal hemorrhage, unspecified gastrointestinal hemorrhage type   Therapist, [facility] at [facility], [place], NP   6 months ago Encounter to establish care   [facility] at [facility], [place], NP   7 months ago Inflamed sebaceous cyst   Therapist, music at [facility], Doretha Sou, MD   8 months ago Diabetes mellitus due to underlying condition with diabetic nephropathy, without long-term current use of insulin ([place])   Therapist, music at [facility], Doretha Sou, MD      Future Appointments            In 4 months [facility] at [place], [facility]

## 2018-12-26 ENCOUNTER — Other Ambulatory Visit: Payer: Self-pay | Admitting: Nephrology

## 2018-12-26 DIAGNOSIS — N184 Chronic kidney disease, stage 4 (severe): Secondary | ICD-10-CM

## 2019-01-02 ENCOUNTER — Other Ambulatory Visit (HOSPITAL_COMMUNITY): Payer: Self-pay | Admitting: Nephrology

## 2019-01-02 ENCOUNTER — Telehealth (HOSPITAL_COMMUNITY): Payer: Self-pay

## 2019-01-02 DIAGNOSIS — N184 Chronic kidney disease, stage 4 (severe): Secondary | ICD-10-CM

## 2019-01-02 DIAGNOSIS — E1122 Type 2 diabetes mellitus with diabetic chronic kidney disease: Secondary | ICD-10-CM

## 2019-01-02 NOTE — Telephone Encounter (Signed)
The above patient or their representative was contacted and gave the following answers to these questions:         Do you have any of the following symptoms?  NO  Fever                    Cough                   Shortness of breath  Do  you have any of the following other symptoms?    muscle pain         vomiting,        diarrhea        rash         weakness        red eye        abdominal pain         bruising          bruising or bleeding              joint pain           severe headache    Have you been in contact with someone who was or has been sick in the past 2 weeks?  NO  Yes                 Unsure                         Unable to assess   Does the person that you were in contact with have any of the following symptoms?   Cough         shortness of breath           muscle pain         vomiting,            diarrhea            rash            weakness           fever            red eye           abdominal pain           bruising  or  bleeding                joint pain                severe headache               Have you  or someone you have been in contact with traveled internationally in th last month?  NO       If yes, which countries?   Have you  or someone you have been in contact with traveled outside [place] in th last month?   NO      If yes, which state and city?   COMMENTS OR ACTION PLAN FOR THIS PATIENT:         

## 2019-01-03 ENCOUNTER — Ambulatory Visit
Admission: RE | Admit: 2019-01-03 | Discharge: 2019-01-03 | Disposition: A | Discharge status: Home / Self Care | Payer: Medicare Other | Source: Ambulatory Visit | Attending: Nephrology | Admitting: Nephrology

## 2019-01-03 ENCOUNTER — Other Ambulatory Visit: Payer: Self-pay

## 2019-01-03 DIAGNOSIS — N184 Chronic kidney disease, stage 4 (severe): Secondary | ICD-10-CM

## 2019-01-04 ENCOUNTER — Ambulatory Visit (HOSPITAL_COMMUNITY)
Admission: RE | Admit: 2019-01-04 | Discharge: 2019-01-04 | Disposition: A | Discharge status: Home / Self Care | Payer: Medicare Other | Source: Ambulatory Visit | Attending: Family | Admitting: Family

## 2019-01-04 DIAGNOSIS — E1122 Type 2 diabetes mellitus with diabetic chronic kidney disease: Secondary | ICD-10-CM | POA: Insufficient documentation

## 2019-01-04 DIAGNOSIS — I129 Hypertensive chronic kidney disease with stage 1 through stage 4 chronic kidney disease, or unspecified chronic kidney disease: Secondary | ICD-10-CM

## 2019-01-04 DIAGNOSIS — N184 Chronic kidney disease, stage 4 (severe): Secondary | ICD-10-CM

## 2019-01-14 ENCOUNTER — Other Ambulatory Visit: Payer: Self-pay

## 2019-01-14 ENCOUNTER — Emergency Department (HOSPITAL_COMMUNITY)
Admission: EM | Admit: 2019-01-14 | Discharge: 2019-01-14 | Disposition: A | Discharge status: Home / Self Care | Payer: Medicare Other | Attending: Emergency Medicine | Admitting: Emergency Medicine

## 2019-01-14 ENCOUNTER — Emergency Department (HOSPITAL_COMMUNITY): Payer: Medicare Other

## 2019-01-14 ENCOUNTER — Encounter (HOSPITAL_COMMUNITY): Payer: Self-pay | Admitting: Emergency Medicine

## 2019-01-14 DIAGNOSIS — Z8546 Personal history of malignant neoplasm of prostate: Secondary | ICD-10-CM | POA: Diagnosis not present

## 2019-01-14 DIAGNOSIS — R0602 Shortness of breath: Secondary | ICD-10-CM | POA: Diagnosis present

## 2019-01-14 DIAGNOSIS — E876 Hypokalemia: Secondary | ICD-10-CM | POA: Diagnosis not present

## 2019-01-14 DIAGNOSIS — Z20828 Contact with and (suspected) exposure to other viral communicable diseases: Secondary | ICD-10-CM | POA: Insufficient documentation

## 2019-01-14 DIAGNOSIS — N184 Chronic kidney disease, stage 4 (severe): Secondary | ICD-10-CM | POA: Insufficient documentation

## 2019-01-14 DIAGNOSIS — Z79899 Other long term (current) drug therapy: Secondary | ICD-10-CM | POA: Insufficient documentation

## 2019-01-14 DIAGNOSIS — R600 Localized edema: Secondary | ICD-10-CM | POA: Diagnosis not present

## 2019-01-14 DIAGNOSIS — I129 Hypertensive chronic kidney disease with stage 1 through stage 4 chronic kidney disease, or unspecified chronic kidney disease: Secondary | ICD-10-CM | POA: Insufficient documentation

## 2019-01-14 DIAGNOSIS — Z7902 Long term (current) use of antithrombotics/antiplatelets: Secondary | ICD-10-CM | POA: Insufficient documentation

## 2019-01-14 DIAGNOSIS — Z87891 Personal history of nicotine dependence: Secondary | ICD-10-CM | POA: Insufficient documentation

## 2019-01-14 DIAGNOSIS — E1122 Type 2 diabetes mellitus with diabetic chronic kidney disease: Secondary | ICD-10-CM | POA: Insufficient documentation

## 2019-01-14 DIAGNOSIS — R0600 Dyspnea, unspecified: Secondary | ICD-10-CM

## 2019-01-14 LAB — I-STAT TROPONIN, ED: Troponin i, poc: 0 ng/mL (ref 0.00–0.08)

## 2019-01-14 LAB — CBC
HCT: 33.6 % — ABNORMAL LOW (ref 39.0–52.0)
Hemoglobin: 11 g/dL — ABNORMAL LOW (ref 13.0–17.0)
MCH: 26.5 pg (ref 26.0–34.0)
MCHC: 32.7 g/dL (ref 30.0–36.0)
MCV: 81 fL (ref 80.0–100.0)
Platelets: 300 10*3/uL (ref 150–400)
RBC: 4.15 MIL/uL — ABNORMAL LOW (ref 4.22–5.81)
RDW: 15.1 % (ref 11.5–15.5)
WBC: 9 10*3/uL (ref 4.0–10.5)
nRBC: 0 % (ref 0.0–0.2)

## 2019-01-14 LAB — COMPREHENSIVE METABOLIC PANEL
ALT: 10 U/L (ref 0–44)
AST: 15 U/L (ref 15–41)
Albumin: 3.6 g/dL (ref 3.5–5.0)
Alkaline Phosphatase: 94 U/L (ref 38–126)
Anion gap: 8 (ref 5–15)
BUN: 29 mg/dL — ABNORMAL HIGH (ref 8–23)
CO2: 23 mmol/L (ref 22–32)
Calcium: 9.1 mg/dL (ref 8.9–10.3)
Chloride: 108 mmol/L (ref 98–111)
Creatinine, Ser: 2.88 mg/dL — ABNORMAL HIGH (ref 0.61–1.24)
GFR calc Af Amer: 23 mL/min — ABNORMAL LOW (ref >60)
GFR calc non Af Amer: 19 mL/min — ABNORMAL LOW (ref >60)
Glucose, Bld: 116 mg/dL — ABNORMAL HIGH (ref 70–99)
Potassium: 3.2 mmol/L — ABNORMAL LOW (ref 3.5–5.1)
Sodium: 139 mmol/L (ref 135–145)
Total Bilirubin: 0.8 mg/dL (ref 0.3–1.2)
Total Protein: 7.2 g/dL (ref 6.5–8.1)

## 2019-01-14 LAB — BRAIN NATRIURETIC PEPTIDE: B Natriuretic Peptide: 860.5 pg/mL — ABNORMAL HIGH (ref 0.0–100.0)

## 2019-01-14 LAB — SARS CORONAVIRUS 2 BY RT PCR (HOSPITAL ORDER, PERFORMED IN ~~LOC~~ HOSPITAL LAB): SARS Coronavirus 2: NEGATIVE

## 2019-01-14 MED ORDER — POTASSIUM CHLORIDE CRYS ER 20 MEQ PO TBCR
40.0000 meq | EXTENDED_RELEASE_TABLET | Freq: Once | ORAL | Status: AC
  Administered 2019-01-14: 40 meq via ORAL
  Filled 2019-01-14: qty 2

## 2019-01-14 MED ORDER — FUROSEMIDE 10 MG/ML IJ SOLN
40.0000 mg | Freq: Once | INTRAMUSCULAR | Status: AC
  Administered 2019-01-14: 40 mg via INTRAVENOUS
  Filled 2019-01-14: qty 4

## 2019-01-14 MED ORDER — FUROSEMIDE 20 MG PO TABS: 20.0000 mg | ORAL_TABLET | Freq: Every day | ORAL | 0 refills | Status: DC

## 2019-01-14 NOTE — ED Notes (Signed)
Assisted pt in calling his wife.

## 2019-01-14 NOTE — ED Notes (Signed)
Patient verbalizes understanding of discharge instructions. Opportunity for questioning and answers were provided. Armband removed by staff, pt discharged from ED.  

## 2019-01-14 NOTE — ED Triage Notes (Signed)
Pt reports SOB x4-5 days that is worse when laying down. Pt reports swelling to lower extremities but states this has been going on for some time now. Denies fevers. Endorses a cough when trying to clear his throat.

## 2019-01-14 NOTE — Discharge Instructions (Addendum)
It was our pleasure to provide your ER care today - we hope that you feel better.  Limit salt intake. Take lasix as prescribed.   From today's lab tests your potassium level is mildly low (3.2) - follow up with primary care doctor in the next few days for recheck.  Your lab tests also show chronic kidney insufficiency - DO NOT take any anti-inflammatory type of medication, such as ibuprofen/motrin, naprosyn/aleve.   Follow up with primary care doctor in the next 2-3 days for recheck - call office tomorrow AM to arrange appointment.  Also follow up closely with cardiologist in the next 1-2 weeks - call office to arrange appointment.   Return to ER right away if worse, new symptoms, fevers, increased difficulty breathing, chest pain, other concern.

## 2019-01-14 NOTE — ED Provider Notes (Addendum)
[facility] EMERGENCY DEPARTMENT Provider Note   CSN: [id number] Arrival date & time: 01/14/19  1151    History   Chief Complaint Chief Complaint  Patient presents with  . Shortness of Breath    HPI Austin [name] is a 84 y.o. male.     Patient c/o sob for the past 4-5 days. Symptoms gradual onset, constant, persistent, mild-mod, slowly worse, worse when lies flat. Denies chest pain or discomfort. No cough or fever. No known covid + exposure. +bilateral ankle/lower leg edema. +orthopnea. No pnd. States compliant w home meds, denies recent change in meds.   The history is provided by the patient.  Shortness of Breath  Associated symptoms: no abdominal pain, no chest pain, no fever, no headaches, no neck pain, no rash, no sore throat and no vomiting     Past Medical History:  Diagnosis Date  . 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 CAROTID ARTERY STENOSIS 05/10/2010  . DIABETES MELLITUS, TYPE II 09/19/2007  . DISEASE, CEREBROVASCULAR NEC 03/05/2007  . GERD 03/13/2007  . HYPERLIPIDEMIA 03/05/2007  . HYPERTENSION 03/05/2007  . HYPOKALEMIA 11/09/2009  . KNEE PAIN, RIGHT 11/09/2009  . PROSTATE CANCER, HX OF 03/05/2007  . RENAL DISEASE, CHRONIC 02/03/2009    Patient Active Problem List   Diagnosis Date Noted  . Gastrointestinal hemorrhage with melena   . Melena 11/03/2016  . Carotid artery stenosis 05/10/2010  . Diabetes mellitus with renal complications ([place]) 54/27/0623  . Iron deficiency anemia due to chronic blood loss 03/13/2007  . GERD 03/13/2007  . Dyslipidemia 03/05/2007  . Essential hypertension 03/05/2007  . DISEASE, CEREBROVASCULAR NEC 03/05/2007  . PROSTATE CANCER, HX OF 03/05/2007    Past Surgical History:  Procedure Laterality Date  . CAROTID ARTERY ANGIOPLASTY Right Oct. 10, 2001  . ESOPHAGOGASTRODUODENOSCOPY (EGD) WITH PROPOFOL N/A 11/04/2016   Procedure: ESOPHAGOGASTRODUODENOSCOPY (EGD) WITH PROPOFOL;  Surgeon: Otis Brace, MD;   Location: [facility];  Service: Gastroenterology;  Laterality: N/A;  . LAPAROSCOPIC APPENDECTOMY  02/20/2012   Procedure: APPENDECTOMY LAPAROSCOPIC;  Surgeon: Stark Klein, MD;  Location: [facility];  Service: General;  Laterality: N/A;  . PROSTATE SURGERY     prostatectomy        Home Medications    Prior to Admission medications   Medication Sig Start Date End Date Taking? Authorizing Provider  amLODipine (NORVASC) 10 MG tablet Take 1 tablet (10 mg total) by mouth daily. 10/30/18  Yes Nafziger, Tommi Rumps, NP  atorvastatin (LIPITOR) 20 MG tablet Take 1 tablet (20 mg total) by mouth daily. 10/30/18  Yes Nafziger, Tommi Rumps, NP  clopidogrel (PLAVIX) 75 MG tablet TAKE 1 TABLET EVERY DAY Patient taking differently: Take 75 mg by mouth daily.  06/21/18  Yes Nafziger, Tommi Rumps, NP  doxazosin (CARDURA) 2 MG tablet Take 2 mg by mouth at bedtime.  12/28/17  Yes [provider]  esomeprazole (NEXIUM) 40 MG capsule TAKE 1 CAPSULE BY MOUTH EVERY DAY Patient taking differently: Take 40 mg by mouth daily at 12 noon.  05/03/18  Yes Marletta Lor, MD  glipiZIDE (GLIPIZIDE XL) 5 MG 24 hr tablet Take 1 tablet (5 mg total) by mouth daily. 12/13/18  Yes Nafziger, Tommi Rumps, NP  hydrocortisone (ANUSOL-HC) 2.5 % rectal cream Place 1 application rectally 2 (two) times daily. 11/13/18  Yes Nafziger, Tommi Rumps, NP  isosorbide mononitrate (IMDUR) 30 MG 24 hr tablet Take 30 mg by mouth every morning. 03/18/18  Yes [provider]  KLOR-CON M10 10 MEQ tablet Take 10 mEq by  mouth daily. 12/26/18  Yes [provider]  losartan (COZAAR) 100 MG tablet Take 1 tablet (100 mg total) by mouth daily. 11/19/18  Yes Nafziger, Tommi Rumps, NP  metoprolol succinate (TOPROL-XL) 25 MG 24 hr tablet TAKE 2 TABLETS (50 MG TOTAL) BY MOUTH DAILY. Patient taking differently: Take 100 mg by mouth daily.  09/19/17  Yes Marletta Lor, MD  nystatin cream (MYCOSTATIN) Apply 1 application topically daily. APPLY TO DENTURES 07/19/17  Yes [provider]  ONETOUCH DELICA LANCETS 93Y MISC USE TO CHECK BLOOD SUGAR DAILY AND AS NEEDED 12/18/17  Yes Marletta Lor, MD  [facility] VERIO test strip USE TO CHECK BLOOD SUGAR DAILY AND AS NEEDED 04/19/18  Yes Marletta Lor, MD  pramoxine-hydrocortisone (PROCTOCREAM-HC) 1-1 % rectal cream Place 1 application rectally 2 (two) times daily. 05/03/18  Yes Marletta Lor, MD  Vitamin D, Ergocalciferol, (DRISDOL) 1.25 MG (50000 UT) CAPS capsule Take 50,000 Units by mouth once a week. TUESDAYS 01/07/19  Yes [provider]    Family History Family History  Problem Relation Age of Onset  . Hypertension Mother   . Cancer Father        Mesothelioma   . Stomach cancer Brother   . Cancer Brother   . Cancer - Cervical Brother   . Cancer Brother   . Diabetes Brother   . Esophageal cancer Neg Hx     Social History Social History   Tobacco Use  . Smoking status: Former Smoker    Types: Cigarettes    Last attempt to quit: 08/22/1974    Years since quitting: 44.4  . Smokeless tobacco: Never Used  Substance Use Topics  . Alcohol use: No    Alcohol/week: 0.0 standard drinks  . Drug use: No     Allergies   Aspirin   Review of Systems Review of Systems  Constitutional: Negative for fever.  HENT: Negative for sore throat.   Eyes: Negative for redness.  Respiratory: Positive for shortness of breath.   Cardiovascular: Positive for leg swelling. Negative for chest pain and palpitations.  Gastrointestinal: Negative for abdominal pain, diarrhea and vomiting.  Endocrine: Negative for polyuria.  Genitourinary: Negative for dysuria and flank pain.  Musculoskeletal: Negative for back pain and neck pain.  Skin: Negative for rash.  Neurological: Negative for headaches.  Hematological: Does not bruise/bleed easily.  Psychiatric/Behavioral: Negative for confusion.     Physical Exam Updated Vital Signs BP (!) 193/80 (BP Location: Right Arm)   Pulse 77   Temp 98.4 F  (36.9 C) (Oral)   Resp (!) 22   Ht 1.702 m (5\' 7" )   Wt 90.7 kg   SpO2 96%   BMI 31.32 kg/m   Physical Exam Vitals signs and nursing note reviewed.  Constitutional:      Appearance: Normal appearance. He is well-developed.  HENT:     Head: Atraumatic.     Nose: Nose normal.     Mouth/Throat:     Mouth: Mucous membranes are moist.     Pharynx: Oropharynx is clear.  Eyes:     General: No scleral icterus.    Conjunctiva/sclera: Conjunctivae normal.  Neck:     Musculoskeletal: Normal range of motion and neck supple. No neck rigidity.     Trachea: No tracheal deviation.  Cardiovascular:     Rate and Rhythm: Normal rate and regular rhythm.     Pulses: Normal pulses.     Heart sounds: Normal heart sounds. No murmur. No friction rub.  No gallop.   Pulmonary:     Effort: Pulmonary effort is normal. No accessory muscle usage or respiratory distress.     Breath sounds: Normal breath sounds.  Abdominal:     General: Bowel sounds are normal. There is no distension.     Palpations: Abdomen is soft. There is no mass.     Tenderness: There is no abdominal tenderness. There is no guarding or rebound.     Hernia: No hernia is present.  Genitourinary:    Comments: No cva tenderness. Musculoskeletal:        General: No swelling.     Right lower leg: Edema present.     Left lower leg: Edema present.     Comments: Bilateral foot/ankle/lower leg edema, symmetric.   Skin:    General: Skin is warm and dry.     Findings: No rash.  Neurological:     Mental Status: He is alert.     Comments: Alert, speech clear.   Psychiatric:        Mood and Affect: Mood normal.      ED Treatments / Results  Labs (all labs ordered are listed, but only abnormal results are displayed) Results for orders placed or performed during the hospital encounter of 01/14/19  SARS Coronavirus 2 (CEPHEID - Performed in [facility] hospital lab), [facility] Order  Result Value Ref Range   SARS Coronavirus 2 NEGATIVE  NEGATIVE  CBC  Result Value Ref Range   WBC 9.0 4.0 - 10.5 K/uL   RBC 4.15 (L) 4.22 - 5.81 MIL/uL   Hemoglobin 11.0 (L) 13.0 - 17.0 g/dL   HCT 33.6 (L) 39.0 - 52.0 %   MCV 81.0 80.0 - 100.0 fL   MCH 26.5 26.0 - 34.0 pg   MCHC 32.7 30.0 - 36.0 g/dL   RDW 15.1 11.5 - 15.5 %   Platelets 300 150 - 400 K/uL   nRBC 0.0 0.0 - 0.2 %  Comprehensive metabolic panel  Result Value Ref Range   Sodium 139 135 - 145 mmol/L   Potassium 3.2 (L) 3.5 - 5.1 mmol/L   Chloride 108 98 - 111 mmol/L   CO2 23 22 - 32 mmol/L   Glucose, Bld 116 (H) 70 - 99 mg/dL   BUN 29 (H) 8 - 23 mg/dL   Creatinine, Ser 2.88 (H) 0.61 - 1.24 mg/dL   Calcium 9.1 8.9 - 10.3 mg/dL   Total Protein 7.2 6.5 - 8.1 g/dL   Albumin 3.6 3.5 - 5.0 g/dL   AST 15 15 - 41 U/L   ALT 10 0 - 44 U/L   Alkaline Phosphatase 94 38 - 126 U/L   Total Bilirubin 0.8 0.3 - 1.2 mg/dL   GFR calc non Af Amer 19 (L) >60 mL/min   GFR calc Af Amer 23 (L) >60 mL/min   Anion gap 8 5 - 15  Brain natriuretic peptide  Result Value Ref Range   B Natriuretic Peptide 860.5 (H) 0.0 - 100.0 pg/mL  I-stat troponin, ED  Result Value Ref Range   Troponin i, poc 0.00 0.00 - 0.08 ng/mL   Comment 3            Dg Chest Port 1 View  Result Date: 01/14/2019 CLINICAL DATA:  Shortness of breath. EXAM: PORTABLE CHEST 1 VIEW COMPARISON:  Chest x-ray dated 02/20/2012. Chest CT dated 11/03/2016. FINDINGS: Cardiomegaly. Lungs are clear. No pleural effusion or pneumothorax seen. Osseous structures about the chest are unremarkable. IMPRESSION: 1. No active disease.  No evidence of pneumonia or pulmonary edema. 2. Cardiomegaly. Electronically Signed   By: Franki Cabot M.D.   On: 01/14/2019 12:37     EKG EKG Interpretation  Date/Time:  Monday Jan 14 2019 12:02:04 EDT Ventricular Rate:  74 PR Interval:    QRS Duration: 96 QT Interval:  405 QTC Calculation: 450 R Axis:   -31 Text Interpretation:  Sinus rhythm LVH with secondary repolarization abnormality Nonspecific  T wave abnormality Confirmed by Lajean Saver [phone]) on 01/14/2019 12:04:35 PM   Radiology Dg Chest Port 1 View  Result Date: 01/14/2019 CLINICAL DATA:  Shortness of breath. EXAM: PORTABLE CHEST 1 VIEW COMPARISON:  Chest x-ray dated 02/20/2012. Chest CT dated 11/03/2016. FINDINGS: Cardiomegaly. Lungs are clear. No pleural effusion or pneumothorax seen. Osseous structures about the chest are unremarkable. IMPRESSION: 1. No active disease. No evidence of pneumonia or pulmonary edema. 2. Cardiomegaly. Electronically Signed   By: Franki Cabot M.D.   On: 01/14/2019 12:37    Procedures Procedures (including critical care time)  Medications Ordered in ED Medications - No data to display   Initial Impression / Assessment and Plan / ED Course  I have reviewed the triage vital signs and the nursing notes.  Pertinent labs & imaging results that were available during my care of the patient were reviewed by me and considered in my medical decision making (see chart for details).  Iv ns. Cxr. Labs. Ecg.   Reviewed nursing notes and prior charts for additional history.   Labs reviewed by me - bnp is elevated. Lasix iv. k mildly low. kcl po. covid neg.  Dewight Catino was evaluated in Emergency Department on 01/14/2019 for the symptoms described in the history of present illness. He was evaluated in the context of the global COVID-19 pandemic, which necessitated consideration that the patient might be at risk for infection with the SARS-CoV-2 virus that causes COVID-19. Institutional protocols and algorithms that pertain to the evaluation of patients at risk for COVID-19 are in a state of rapid change based on information released by regulatory bodies including the CDC and federal and state organizations. These policies and algorithms were followed during the patient's care in the ED.   Cxr reviewed by me - no edema/pna.   Pt on recheck is breathing comfortably. No chest pain or discomfort.   Will  start on lasix, close pcp/card f/u - Possible outpt echo.   Return precautions provided.     Final Clinical Impressions(s) / ED Diagnoses   Final diagnoses:  None    ED Discharge Orders    None           Lajean Saver, MD 01/14/19 1436

## 2019-01-16 ENCOUNTER — Other Ambulatory Visit: Payer: Self-pay

## 2019-01-16 ENCOUNTER — Encounter: Payer: Self-pay | Admitting: Adult Health

## 2019-01-16 ENCOUNTER — Ambulatory Visit (INDEPENDENT_AMBULATORY_CARE_PROVIDER_SITE_OTHER): Payer: Medicare Other | Admitting: Adult Health

## 2019-01-16 DIAGNOSIS — R600 Localized edema: Secondary | ICD-10-CM

## 2019-01-16 DIAGNOSIS — R0602 Shortness of breath: Secondary | ICD-10-CM | POA: Diagnosis not present

## 2019-01-16 DIAGNOSIS — G479 Sleep disorder, unspecified: Secondary | ICD-10-CM

## 2019-01-16 NOTE — Progress Notes (Signed)
Virtual Visit via Telephone Note  I connected with Austin [name] on 01/16/19 at  9:00 AM EDT by telephone and verified that I am speaking with the correct person using two identifiers.   I discussed the limitations, risks, security and privacy concerns of performing an evaluation and management service by telephone and the availability of in person appointments. I also discussed with the patient that there may be a patient responsible charge related to this service. The patient expressed understanding and agreed to proceed.  Location patient: home Location provider: work or home office Participants present for the call: patient, provider Patient did not have a visit in the prior 7 days to address this/these issue(s).   History of Present Illness: 84 year old male who  has a past medical history of ANEMIA DUE TO CHRONIC BLOOD LOSS (03/13/2007), CAROTID ARTERY STENOSIS (05/10/2010), DIABETES MELLITUS, TYPE II (09/19/2007), DISEASE, CEREBROVASCULAR NEC (03/05/2007), GERD (03/13/2007), HYPERLIPIDEMIA (03/05/2007), HYPERTENSION (03/05/2007), HYPOKALEMIA (11/09/2009), KNEE PAIN, RIGHT (11/09/2009), PROSTATE CANCER, HX OF (03/05/2007), and RENAL DISEASE, CHRONIC (02/03/2009).  He was seen in the emergency room 2 days ago with a complaint of shortness of breath for 4 to 5 days prior to presentation.  His symptoms were gradual onset, constant and persistent and worse when he lied flat at night.  He denied chest pain or discomfort.  He did have positive bilateral ankle and lower leg edema as well as Orthopnea.   Chest x-ray was negative for edema or pneumonia.  BNP was elevated ( 864)  and IV Lasix was given.  He was discharged on Lasix 20 mg daily.  Today he reports that he continues to have some shortness of breath when laying flat but this seems to improving.  He also continues to have lower extremity edema that is worse in the left but this is also improving.  Was seen by nephrology early last week and was seen by  his cardiologist Dr. Terrence Dupont yesterday.  His biggest complaint is not resting well due to the shortness of breath.  He is able to fall asleep but will wake up in the middle of the night and not be able to fall back asleep.  He continues to deny chest pain  Reports that he is going to have repeat lab work on Monday through nephrology   Observations/Objective: Patient sounds cheerful and well on the phone. I do not appreciate any SOB. Speech and thought processing are grossly intact. Patient reported vitals:  Assessment and Plan:  Continue to take Lasix as directed.  Follow-up with nephrology and cardiology as directed.  Sleep disturbance should improve as he continues to take Lasix and becomes less short of breath.  He can take a low-dose melatonin if this does not improve.Return precautions reviewed     Follow Up Instructions: I did not refer this patient for an OV in the next 24 hours for this/these issue(s).  I discussed the assessment and treatment plan with the patient. The patient was provided an opportunity to ask questions and all were answered. The patient agreed with the plan and demonstrated an understanding of the instructions.   The patient was advised to call back or seek an in-person evaluation if the symptoms worsen or if the condition fails to improve as anticipated.  I provided 26 minutes of non-face-to-face time during this encounter.   Dorothyann Peng, NP

## 2019-01-18 ENCOUNTER — Other Ambulatory Visit: Payer: Self-pay

## 2019-01-18 ENCOUNTER — Ambulatory Visit: Payer: Medicare Other | Admitting: Adult Health

## 2019-01-18 ENCOUNTER — Encounter: Payer: Self-pay | Admitting: Adult Health

## 2019-01-18 VITALS — BP 160/80 | Temp 98.2°F | Wt 199.0 lb

## 2019-01-18 DIAGNOSIS — L0291 Cutaneous abscess, unspecified: Secondary | ICD-10-CM

## 2019-01-18 MED ORDER — CLINDAMYCIN HCL 300 MG PO CAPS: 300.0000 mg | ORAL_CAPSULE | Freq: Four times a day (QID) | ORAL | 0 refills | Status: AC

## 2019-01-18 NOTE — Patient Instructions (Signed)
It was great seeing you today   I have sent in an antibiotic called Clindamycin, take this as directed  Also pick up a probiotic at the pharmacy to help with diarrhea

## 2019-01-18 NOTE — Progress Notes (Signed)
Subjective:    Patient ID: Austin [name], male    DOB: 1935/02/13, 84 y.o.   MRN: [id number]  HPI 84 year old male who  has a past medical history of ANEMIA DUE TO CHRONIC BLOOD LOSS (03/13/2007), CAROTID ARTERY STENOSIS (05/10/2010), DIABETES MELLITUS, TYPE II (09/19/2007), DISEASE, CEREBROVASCULAR NEC (03/05/2007), GERD (03/13/2007), HYPERLIPIDEMIA (03/05/2007), HYPERTENSION (03/05/2007), HYPOKALEMIA (11/09/2009), KNEE PAIN, RIGHT (11/09/2009), PROSTATE CANCER, HX OF (03/05/2007), and RENAL DISEASE, CHRONIC (02/03/2009).  He presents to the office today for abscess on right cheek. He was treated for this in September 2019 by his previous PCP and then again in October by me with a course of doxycycline. He reports that the abscess never has gone away.   It will become large and painful at times and he will squeeze " junk out of it".    Review of Systems See HPI   Past Medical History:  Diagnosis Date  . 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 CAROTID ARTERY STENOSIS 05/10/2010  . DIABETES MELLITUS, TYPE II 09/19/2007  . DISEASE, CEREBROVASCULAR NEC 03/05/2007  . GERD 03/13/2007  . HYPERLIPIDEMIA 03/05/2007  . HYPERTENSION 03/05/2007  . HYPOKALEMIA 11/09/2009  . KNEE PAIN, RIGHT 11/09/2009  . PROSTATE CANCER, HX OF 03/05/2007  . RENAL DISEASE, CHRONIC 02/03/2009    Social History   Socioeconomic History  . Marital status: Married    Spouse name: Not on file  . Number of children: Not on file  . Years of education: Not on file  . Highest education level: Not on file  Occupational History  . Not on file  Social Needs  . Financial resource strain: Not on file  . Food insecurity:    Worry: Not on file    Inability: Not on file  . Transportation needs:    Medical: Not on file    Non-medical: Not on file  Tobacco Use  . Smoking status: Former Smoker    Types: Cigarettes    Last attempt to quit: 08/22/1974    Years since quitting: 44.4  . Smokeless tobacco: Never Used  Substance and  Sexual Activity  . Alcohol use: No    Alcohol/week: 0.0 standard drinks  . Drug use: No  . Sexual activity: Not on file  Lifestyle  . Physical activity:    Days per week: Not on file    Minutes per session: Not on file  . Stress: Not on file  Relationships  . Social connections:    Talks on phone: Not on file    Gets together: Not on file    Attends religious service: Not on file    Active member of club or organization: Not on file    Attends meetings of clubs or organizations: Not on file    Relationship status: Not on file  . Intimate partner violence:    Fear of current or ex partner: Not on file    Emotionally abused: Not on file    Physically abused: Not on file    Forced sexual activity: Not on file  Other Topics Concern  . Not on file  Social History Narrative   Retired - Environmental consultant    Married 47 years       He enjoys traveling     Past Surgical History:  Procedure Laterality Date  . CAROTID ARTERY ANGIOPLASTY Right Oct. 10, 2001  . ESOPHAGOGASTRODUODENOSCOPY (EGD) WITH PROPOFOL N/A 11/04/2016   Procedure: ESOPHAGOGASTRODUODENOSCOPY (EGD) WITH PROPOFOL;  Surgeon: Otis Brace, MD;  Location: [facility];  Service: Gastroenterology;  Laterality: N/A;  . LAPAROSCOPIC APPENDECTOMY  02/20/2012   Procedure: APPENDECTOMY LAPAROSCOPIC;  Surgeon: Stark Klein, MD;  Location: [facility];  Service: General;  Laterality: N/A;  . PROSTATE SURGERY     prostatectomy    Family History  Problem Relation Age of Onset  . Hypertension Mother   . Cancer Father        Mesothelioma   . Stomach cancer Brother   . Cancer Brother   . Cancer - Cervical Brother   . Cancer Brother   . Diabetes Brother   . Esophageal cancer Neg Hx     Allergies  Allergen Reactions  . Aspirin Other (See Comments)    High doses causes stomach ulcer and bleeding    Current Outpatient Medications on File Prior to Visit  Medication Sig Dispense Refill  . amLODipine (NORVASC) 10 MG tablet Take  1 tablet (10 mg total) by mouth daily. 90 tablet 0  . atorvastatin (LIPITOR) 20 MG tablet Take 1 tablet (20 mg total) by mouth daily. 90 tablet 0  . clopidogrel (PLAVIX) 75 MG tablet TAKE 1 TABLET EVERY DAY (Patient taking differently: Take 75 mg by mouth daily. ) 90 tablet 3  . doxazosin (CARDURA) 2 MG tablet Take 2 mg by mouth at bedtime.   3  . esomeprazole (NEXIUM) 40 MG capsule TAKE 1 CAPSULE BY MOUTH EVERY DAY (Patient taking differently: Take 40 mg by mouth daily at 12 noon. ) 90 capsule 3  . furosemide (LASIX) 20 MG tablet Take 1 tablet (20 mg total) by mouth daily. 15 tablet 0  . glipiZIDE (GLIPIZIDE XL) 5 MG 24 hr tablet Take 1 tablet (5 mg total) by mouth daily. 90 tablet 0  . hydrocortisone (ANUSOL-HC) 2.5 % rectal cream Place 1 application rectally 2 (two) times daily. 30 g 3  . isosorbide-hydrALAZINE (BIDIL) 20-37.5 MG tablet Take 1 tablet by mouth 2 (two) times daily.    Marland Kitchen KLOR-CON M10 10 MEQ tablet Take 20 mEq by mouth daily.     Marland Kitchen losartan (COZAAR) 100 MG tablet Take 1 tablet (100 mg total) by mouth daily. 90 tablet 0  . metoprolol succinate (TOPROL-XL) 25 MG 24 hr tablet TAKE 2 TABLETS (50 MG TOTAL) BY MOUTH DAILY. (Patient taking differently: Take 100 mg by mouth daily. ) 90 tablet 4  . nystatin cream (MYCOSTATIN) Apply 1 application topically daily. APPLY TO DENTURES  1  . ONETOUCH DELICA LANCETS 12I MISC USE TO CHECK BLOOD SUGAR DAILY AND AS NEEDED 200 each 1  . ONETOUCH VERIO test strip USE TO CHECK BLOOD SUGAR DAILY AND AS NEEDED 200 each 0  . pramoxine-hydrocortisone (PROCTOCREAM-HC) 1-1 % rectal cream Place 1 application rectally 2 (two) times daily. 30 g 4  . Vitamin D, Ergocalciferol, (DRISDOL) 1.25 MG (50000 UT) CAPS capsule Take 50,000 Units by mouth once a week. TUESDAYS     No current facility-administered medications on file prior to visit.     BP (!) 160/80   Temp 98.2 F (36.8 C)   Wt 199 lb (90.3 kg)   BMI 31.17 kg/m       Objective:   Physical Exam  Vitals signs and nursing note reviewed.  Constitutional:      Appearance: Normal appearance.  Skin:    General: Skin is warm and dry.     Findings: Abscess present.  Neurological:     Mental Status: He is alert.       Assessment & Plan:  1. Abscess -  abscess continues to be non fluctuant. Scant amount of purulent drainage was able to be removed. Will start on Clindamycin and send culture  - Advised to take pro biotic with antibiotic. Will follow up with patient once culture has resulted.  - Consider referral to dermatology - WOUND CULTURE - clindamycin (CLEOCIN) 300 MG capsule; Take 1 capsule (300 mg total) by mouth 4 (four) times daily for 7 days.  Dispense: 28 capsule; Refill: 0   Dorothyann Peng, NP

## 2019-01-21 LAB — TIQ-NTM

## 2019-01-22 ENCOUNTER — Other Ambulatory Visit: Payer: Self-pay | Admitting: Adult Health

## 2019-01-23 LAB — WOUND CULTURE

## 2019-01-24 NOTE — Telephone Encounter (Signed)
Ok to refill for 90 days. Needs CPE within that time frame

## 2019-01-25 ENCOUNTER — Ambulatory Visit: Payer: Self-pay

## 2019-01-25 NOTE — Telephone Encounter (Signed)
  Returned call to patient who states that he was prescribed Cleocin antibiotic.  He has tried taking it on an empty stomach and then with food. She states that every time he takes a dose he get really bad heart burn. He has red the insert instructions and does not know what else to do. He has not vomited. He states the heartburn eventually goes away. Care advice read to patient. Pt verbalized understanding of all instructions.  Call transferred to office for advice/scheduling.  Reason for Disposition . Taking prescription medication that could cause nausea (e.g., narcotics/opiates, antibiotics, OCPs, many others)  Answer Assessment - Initial Assessment Questions 1. SYMPTOMS: "Do you have any symptoms?"     Heart burn from  2. SEVERITY: If symptoms are present, ask "Are they mild, moderate or severe?"    severe  Answer Assessment - Initial Assessment Questions 1. NAUSEA SEVERITY: "How bad is the nausea?" (e.g., mild, moderate, severe; dehydration, weight loss)   - MILD: loss of appetite without change in eating habits   - MODERATE: decreased oral intake without significant weight loss, dehydration, or malnutrition   - SEVERE: inadequate caloric or fluid intake, significant weight loss, symptoms of dehydration     Severe burning from medication 2. ONSET: "When did the nausea begin?"    With taking cleocin 3. VOMITING: "Any vomiting?" If so, ask: "How many times today?"    None 4. RECURRENT SYMPTOM: "Have you had nausea before?" If so, ask: "When was the last time?" "What happened that time?"    Heart burn 5. CAUSE: "What do you think is causing the nausea?"     cleocin 6. PREGNANCY: "Is there any chance you are pregnant?" (e.g., unprotected intercourse, missed birth control pill, broken condom)    N/A  Protocols used: NAUSEA-A-AH, MEDICATION QUESTION CALL-A-AH

## 2019-01-25 NOTE — Telephone Encounter (Signed)
Patient called and stated that he was having some heartburn since he started the antibiotic, wants to know if he should continue medication or get and another Rx for something else.

## 2019-01-29 ENCOUNTER — Telehealth: Payer: Self-pay | Admitting: Adult Health

## 2019-01-29 DIAGNOSIS — L0291 Cutaneous abscess, unspecified: Secondary | ICD-10-CM

## 2019-01-29 NOTE — Telephone Encounter (Signed)
Spoke to the patient about abscess on right cheek.  Is currently just finished up course of clindamycin.  Reports that he has not had much improvement in the abscess.  I am going to go ahead and refer him to dermatology for further evaluation

## 2019-02-10 ENCOUNTER — Other Ambulatory Visit: Payer: Self-pay | Admitting: Adult Health

## 2019-02-11 ENCOUNTER — Telehealth: Payer: Self-pay | Admitting: Hematology and Oncology

## 2019-02-11 NOTE — Telephone Encounter (Signed)
Received a new patient referral from [facility] for elevated free light chain ratio. Pt has been scheduled to see Dr. Lindi Adie on 7/1 at 945am. Pt preferred morning appts. Aware to arrive 20 minutes early.

## 2019-02-12 NOTE — Telephone Encounter (Signed)
Needs CPX 

## 2019-02-14 NOTE — Telephone Encounter (Signed)
Sent to the pharmacy by e-scribe.  Pt is now scheduled for cpx.

## 2019-02-19 NOTE — Progress Notes (Signed)
Austin Austin [name] NOTE  Patient Care Team: Austin Peng, NP as PCP - General (Family Medicine) Austin Forward, MD as Consulting Physician (Cardiology) Austin Height, MD as Consulting Physician (Ophthalmology) Austin Bandy, MD as Consulting Physician (Urology)  CHIEF COMPLAINTS/PURPOSE OF CONSULTATION:  Newly diagnosed elevated free light chain ratio  HISTORY OF PRESENTING ILLNESS:  Austin Austin [name] 84 y.o. male is here because of recent finding of elevated free light chain ratio. He was referred by Austin Jeans, MD at [facility] and has a history of stage 4 chronic kidney disease. Labs from 12/2018 showed: Hg 11.2, HCT 33.3, MCV 80, kappa light chain 106.4, lambda light chain 41.5, ratio 2.56, M-spike not observed. He presents to the clinic today for initial evaluation and consultation.   I reviewed his records extensively and collaborated the history with the patient.  MEDICAL HISTORY:  Past Medical History:  Diagnosis Date  . 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 CAROTID ARTERY STENOSIS 05/10/2010  . DIABETES MELLITUS, TYPE II 09/19/2007  . DISEASE, CEREBROVASCULAR NEC 03/05/2007  . GERD 03/13/2007  . HYPERLIPIDEMIA 03/05/2007  . HYPERTENSION 03/05/2007  . HYPOKALEMIA 11/09/2009  . KNEE PAIN, RIGHT 11/09/2009  . PROSTATE CANCER, HX OF 03/05/2007  . RENAL DISEASE, CHRONIC 02/03/2009    SURGICAL HISTORY: Past Surgical History:  Procedure Laterality Date  . CAROTID ARTERY ANGIOPLASTY Right Oct. 10, 2001  . ESOPHAGOGASTRODUODENOSCOPY (EGD) WITH PROPOFOL N/A 11/04/2016   Procedure: ESOPHAGOGASTRODUODENOSCOPY (EGD) WITH PROPOFOL;  Surgeon: Austin Brace, MD;  Location: [facility];  Service: Gastroenterology;  Laterality: N/A;  . LAPAROSCOPIC APPENDECTOMY  02/20/2012   Procedure: APPENDECTOMY LAPAROSCOPIC;  Surgeon: Austin Klein, MD;  Location: [facility];  Service: General;  Laterality: N/A;  . PROSTATE SURGERY     prostatectomy    SOCIAL HISTORY: Social  History   Socioeconomic History  . Marital status: Married    Spouse name: Not on file  . Number of children: Not on file  . Years of education: Not on file  . Highest education level: Not on file  Occupational History  . Not on file  Social Needs  . Financial resource strain: Not on file  . Food insecurity    Worry: Not on file    Inability: Not on file  . Transportation needs    Medical: Not on file    Non-medical: Not on file  Tobacco Use  . Smoking status: Former Smoker    Types: Cigarettes    Quit date: 08/22/1974    Years since quitting: 44.5  . Smokeless tobacco: Never Used  Substance and Sexual Activity  . Alcohol use: No    Alcohol/week: 0.0 standard drinks  . Drug use: No  . Sexual activity: Not on file  Lifestyle  . Physical activity    Days per week: Not on file    Minutes per session: Not on file  . Stress: Not on file  Relationships  . Social Herbalist on phone: Not on file    Gets together: Not on file    Attends religious service: Not on file    Active member of club or organization: Not on file    Attends meetings of clubs or organizations: Not on file    Relationship status: Not on file  . Intimate partner violence    Fear of Austin [name] or ex partner: Not on file    Emotionally abused: Not on file    Physically abused: Not on file    Forced  sexual activity: Not on file  Other Topics Concern  . Not on file  Social History Narrative   Retired - Environmental consultant    Married 30 years       He enjoys traveling     FAMILY HISTORY: Family History  Problem Relation Age of Onset  . Hypertension Mother   . Cancer Father        Mesothelioma   . Stomach cancer Brother   . Cancer Brother   . Cancer - Cervical Brother   . Cancer Brother   . Diabetes Brother   . Esophageal cancer Neg Hx     ALLERGIES:  is allergic to aspirin.  MEDICATIONS:  Austin [name] Outpatient Medications  Medication Sig Dispense Refill  . amLODipine (NORVASC) 10 MG  tablet TAKE 1 TABLET BY MOUTH EVERY DAY 90 tablet 0  . atorvastatin (LIPITOR) 20 MG tablet TAKE 1 TABLET BY MOUTH EVERY DAY 90 tablet 0  . clopidogrel (PLAVIX) 75 MG tablet TAKE 1 TABLET EVERY DAY (Patient taking differently: Take 75 mg by mouth daily. ) 90 tablet 3  . doxazosin (CARDURA) 2 MG tablet Take 2 mg by mouth at bedtime.   3  . esomeprazole (NEXIUM) 40 MG capsule TAKE 1 CAPSULE BY MOUTH EVERY DAY (Patient taking differently: Take 40 mg by mouth daily at 12 noon. ) 90 capsule 3  . furosemide (LASIX) 20 MG tablet Take 1 tablet (20 mg total) by mouth daily. 15 tablet 0  . glipiZIDE (GLIPIZIDE XL) 5 MG 24 hr tablet Take 1 tablet (5 mg total) by mouth daily. 90 tablet 0  . hydrocortisone (ANUSOL-HC) 2.5 % rectal cream Place 1 application rectally 2 (two) times daily. 30 g 3  . isosorbide-hydrALAZINE (BIDIL) 20-37.5 MG tablet Take 1 tablet by mouth 2 (two) times daily.    Marland Kitchen KLOR-CON M10 10 MEQ tablet Take 20 mEq by mouth daily.     Marland Kitchen losartan (COZAAR) 100 MG tablet TAKE 1 TABLET BY MOUTH EVERY DAY 90 tablet 0  . metoprolol succinate (TOPROL-XL) 25 MG 24 hr tablet TAKE 2 TABLETS (50 MG TOTAL) BY MOUTH DAILY. (Patient taking differently: Take 100 mg by mouth daily. ) 90 tablet 4  . nystatin cream (MYCOSTATIN) Apply 1 application topically daily. APPLY TO DENTURES  1  . ONETOUCH DELICA LANCETS 09G MISC USE TO CHECK BLOOD SUGAR DAILY AND AS NEEDED 200 each 1  . ONETOUCH VERIO test strip USE TO CHECK BLOOD SUGAR DAILY AND AS NEEDED 200 each 0  . pramoxine-hydrocortisone (PROCTOCREAM-HC) 1-1 % rectal cream Place 1 application rectally 2 (two) times daily. 30 g 4  . Vitamin D, Ergocalciferol, (DRISDOL) 1.25 MG (50000 UT) CAPS capsule Take 50,000 Units by mouth once a week. TUESDAYS     No Austin [name] facility-administered medications for this visit.     REVIEW OF SYSTEMS:   Constitutional: Denies fevers, chills or abnormal night sweats Eyes: Denies blurriness of vision, double vision or watery eyes  Ears, nose, mouth, throat, and face: Denies mucositis or sore throat Respiratory: Denies cough, dyspnea or wheezes Cardiovascular: Denies palpitation, chest discomfort or lower extremity swelling Gastrointestinal:  Denies nausea, heartburn or change in bowel habits Skin: Denies abnormal skin rashes Lymphatics: Denies new lymphadenopathy or easy bruising Neurological:Denies numbness, tingling or new weaknesses Behavioral/Psych: Mood is stable, no new changes  All other systems were reviewed with the patient and are negative.  PHYSICAL EXAMINATION: ECOG PERFORMANCE STATUS: 1 - Symptomatic but completely ambulatory  Vitals:   02/20/19 0944  BP: Marland Kitchen)  158/55  Pulse: 70  Resp: 18  Temp: 98.9 F (37.2 C)  SpO2: 98%   Filed Weights   02/20/19 0944  Weight: 196 lb 6.4 oz (89.1 kg)    GENERAL:alert, no distress and comfortable SKIN: skin color, texture, turgor are normal, no rashes or significant lesions EYES: normal, conjunctiva are pink and non-injected, sclera clear OROPHARYNX:no exudate, no erythema and lips, buccal mucosa, and tongue normal  NECK: supple, thyroid normal size, non-tender, without nodularity LYMPH:  no palpable lymphadenopathy in the cervical, axillary or inguinal LUNGS: clear to auscultation and percussion with normal breathing effort HEART: regular rate & rhythm and no murmurs and no lower extremity edema ABDOMEN:abdomen soft, non-tender and normal bowel sounds Musculoskeletal:no cyanosis of digits and no clubbing  PSYCH: alert & oriented x 3 with fluent speech NEURO: no focal motor/sensory deficits  LABORATORY DATA:  I have reviewed the data as listed Lab Results  Component Value Date   WBC 9.0 01/14/2019   HGB 11.0 (L) 01/14/2019   HCT 33.6 (L) 01/14/2019   MCV 81.0 01/14/2019   PLT 300 01/14/2019   Lab Results  Component Value Date   NA 139 01/14/2019   K 3.2 (L) 01/14/2019   CL 108 01/14/2019   CO2 23 01/14/2019    RADIOGRAPHIC STUDIES: I  have personally reviewed the radiological reports and agreed with the findings in the report.  ASSESSMENT AND PLAN:  Elevated serum immunoglobulin free light chains Chronic kidney disease work-up done by nephrology revealed elevated free light chains No monoclonal protein Serum kappa: 106.4 Serum lambda: 41.5 Kappa lambda ratio: 2.56 Creatinine 2.37, albumin 3.9 Hemoglobin 11.2  Counseling: I discussed with the patient the spectrum of disorders from polyclonal elevation of light chains due to reactive causes versus MGUS to multiple myeloma. We discussed the role of plasma cells in producing immunoglobulins. We discussed structure of immunoglobulins on how they make up the heavy chains and the light chains. The light chains are Kappa and lambda. I discussed the difference between MGUS and multiple myeloma.   Recommendation: I suspect the cause of the elevated light chains is his skin infection on the right side of his face. Patient is seeing dermatology for this. He has not responded to 2 rounds of antibiotics. I do not think there is any need to perform bone marrow biopsies or bone surveys at this time. I will repeat his labs in 6 months and follow-up after that.     All questions were answered. The patient knows to call the clinic with any problems, questions or concerns.   Rulon Eisenmenger, MD 02/20/2019    I, Molly Dorshimer, am acting as scribe for Nicholas Lose, MD.  I have reviewed the above documentation for accuracy and completeness, and I agree with the above.

## 2019-02-20 ENCOUNTER — Other Ambulatory Visit: Payer: Self-pay

## 2019-02-20 ENCOUNTER — Inpatient Hospital Stay: Payer: Medicare Other | Attending: Hematology and Oncology | Admitting: Hematology and Oncology

## 2019-02-20 VITALS — BP 158/55 | HR 70 | Temp 98.9°F | Resp 18 | Ht 67.0 in | Wt 196.4 lb

## 2019-02-20 DIAGNOSIS — E1122 Type 2 diabetes mellitus with diabetic chronic kidney disease: Secondary | ICD-10-CM | POA: Insufficient documentation

## 2019-02-20 DIAGNOSIS — Z79899 Other long term (current) drug therapy: Secondary | ICD-10-CM | POA: Insufficient documentation

## 2019-02-20 DIAGNOSIS — E785 Hyperlipidemia, unspecified: Secondary | ICD-10-CM | POA: Insufficient documentation

## 2019-02-20 DIAGNOSIS — Z87891 Personal history of nicotine dependence: Secondary | ICD-10-CM | POA: Insufficient documentation

## 2019-02-20 DIAGNOSIS — R768 Other specified abnormal immunological findings in serum: Secondary | ICD-10-CM | POA: Diagnosis not present

## 2019-02-20 DIAGNOSIS — Z8546 Personal history of malignant neoplasm of prostate: Secondary | ICD-10-CM | POA: Diagnosis not present

## 2019-02-20 DIAGNOSIS — D472 Monoclonal gammopathy: Secondary | ICD-10-CM | POA: Diagnosis not present

## 2019-02-20 DIAGNOSIS — I129 Hypertensive chronic kidney disease with stage 1 through stage 4 chronic kidney disease, or unspecified chronic kidney disease: Secondary | ICD-10-CM | POA: Insufficient documentation

## 2019-02-20 DIAGNOSIS — N184 Chronic kidney disease, stage 4 (severe): Secondary | ICD-10-CM | POA: Insufficient documentation

## 2019-02-20 DIAGNOSIS — Z7901 Long term (current) use of anticoagulants: Secondary | ICD-10-CM | POA: Diagnosis not present

## 2019-02-20 DIAGNOSIS — K219 Gastro-esophageal reflux disease without esophagitis: Secondary | ICD-10-CM | POA: Diagnosis not present

## 2019-02-20 DIAGNOSIS — E876 Hypokalemia: Secondary | ICD-10-CM | POA: Insufficient documentation

## 2019-02-20 NOTE — Assessment & Plan Note (Signed)
Chronic kidney disease work-up done by nephrology revealed elevated free light chains No monoclonal protein Serum kappa: 106.4 Serum lambda: 41.5 Kappa lambda ratio: 2.56 Creatinine 2.37, albumin 3.9 Hemoglobin 11.2  Counseling: I discussed with the patient the spectrum of disorders from polyclonal elevation of light chains due to reactive causes versus MGUS to multiple myeloma. We discussed the role of plasma cells in producing immunoglobulins. We discussed structure of immunoglobulins on how they make up the heavy chains and the light chains. The light chains are Kappa and lambda. I discussed the difference between MGUS and multiple myeloma.   Recommendation: I suspect the cause of the elevated light chains is his skin infection on the right side of his face. Patient is seeing dermatology for this. He has not responded to 2 rounds of antibiotics. I do not think there is any need to perform bone marrow biopsies or bone surveys at this time. I will repeat his labs in 6 months and follow-up after that.

## 2019-02-21 ENCOUNTER — Telehealth: Payer: Self-pay | Admitting: Hematology and Oncology

## 2019-02-21 NOTE — Telephone Encounter (Signed)
I talk with patient regarding schedule  

## 2019-03-08 ENCOUNTER — Other Ambulatory Visit: Payer: Self-pay | Admitting: Adult Health

## 2019-03-08 ENCOUNTER — Telehealth: Payer: Self-pay | Admitting: Adult Health

## 2019-03-08 MED ORDER — ONETOUCH VERIO VI STRP: ORAL_STRIP | 3 refills | Status: DC

## 2019-03-08 NOTE — Telephone Encounter (Signed)
Sent to the pharmacy by e-scribe. 

## 2019-03-08 NOTE — Telephone Encounter (Signed)
Medication Refill - Medication:  ONETOUCH VERIO test strip  Has the patient contacted their pharmacy? Yes pharmacy advised to call due to having sent requests all week.   Preferred Pharmacy (with phone number or street name):  [id number] - [place], [place] [phone] (Phone) [phone] (Fax)   Agent: Please be advised that RX refills may take up to 3 business days. We ask that you follow-up with your pharmacy.

## 2019-03-08 NOTE — Telephone Encounter (Signed)
Ok to fill till then

## 2019-03-12 NOTE — Telephone Encounter (Signed)
Sent to the pharmacy by e-scribe. 

## 2019-03-20 ENCOUNTER — Encounter: Payer: Self-pay | Admitting: Family Medicine

## 2019-03-27 ENCOUNTER — Inpatient Hospital Stay (HOSPITAL_COMMUNITY)
Admission: EM | Admit: 2019-03-27 | Discharge: 2019-03-29 | DRG: 304 | Disposition: A | Discharge status: Home / Self Care | Payer: Medicare Other | Attending: Family Medicine | Admitting: Family Medicine

## 2019-03-27 ENCOUNTER — Encounter (HOSPITAL_COMMUNITY): Payer: Self-pay | Admitting: Emergency Medicine

## 2019-03-27 ENCOUNTER — Other Ambulatory Visit: Payer: Self-pay

## 2019-03-27 ENCOUNTER — Emergency Department (HOSPITAL_COMMUNITY): Payer: Medicare Other

## 2019-03-27 DIAGNOSIS — Z833 Family history of diabetes mellitus: Secondary | ICD-10-CM

## 2019-03-27 DIAGNOSIS — I509 Heart failure, unspecified: Secondary | ICD-10-CM

## 2019-03-27 DIAGNOSIS — E1122 Type 2 diabetes mellitus with diabetic chronic kidney disease: Secondary | ICD-10-CM | POA: Diagnosis present

## 2019-03-27 DIAGNOSIS — T502X5A Adverse effect of carbonic-anhydrase inhibitors, benzothiadiazides and other diuretics, initial encounter: Secondary | ICD-10-CM | POA: Diagnosis present

## 2019-03-27 DIAGNOSIS — Z8249 Family history of ischemic heart disease and other diseases of the circulatory system: Secondary | ICD-10-CM

## 2019-03-27 DIAGNOSIS — K219 Gastro-esophageal reflux disease without esophagitis: Secondary | ICD-10-CM | POA: Diagnosis present

## 2019-03-27 DIAGNOSIS — I160 Hypertensive urgency: Principal | ICD-10-CM | POA: Diagnosis present

## 2019-03-27 DIAGNOSIS — E785 Hyperlipidemia, unspecified: Secondary | ICD-10-CM | POA: Diagnosis present

## 2019-03-27 DIAGNOSIS — E1169 Type 2 diabetes mellitus with other specified complication: Secondary | ICD-10-CM | POA: Diagnosis present

## 2019-03-27 DIAGNOSIS — Z8546 Personal history of malignant neoplasm of prostate: Secondary | ICD-10-CM

## 2019-03-27 DIAGNOSIS — I5031 Acute diastolic (congestive) heart failure: Secondary | ICD-10-CM | POA: Diagnosis present

## 2019-03-27 DIAGNOSIS — N179 Acute kidney failure, unspecified: Secondary | ICD-10-CM | POA: Diagnosis present

## 2019-03-27 DIAGNOSIS — E1129 Type 2 diabetes mellitus with other diabetic kidney complication: Secondary | ICD-10-CM | POA: Diagnosis present

## 2019-03-27 DIAGNOSIS — I6529 Occlusion and stenosis of unspecified carotid artery: Secondary | ICD-10-CM | POA: Diagnosis present

## 2019-03-27 DIAGNOSIS — Z79899 Other long term (current) drug therapy: Secondary | ICD-10-CM

## 2019-03-27 DIAGNOSIS — D500 Iron deficiency anemia secondary to blood loss (chronic): Secondary | ICD-10-CM | POA: Diagnosis present

## 2019-03-27 DIAGNOSIS — Z9862 Peripheral vascular angioplasty status: Secondary | ICD-10-CM

## 2019-03-27 DIAGNOSIS — I130 Hypertensive heart and chronic kidney disease with heart failure and stage 1 through stage 4 chronic kidney disease, or unspecified chronic kidney disease: Secondary | ICD-10-CM | POA: Diagnosis present

## 2019-03-27 DIAGNOSIS — Z87891 Personal history of nicotine dependence: Secondary | ICD-10-CM

## 2019-03-27 DIAGNOSIS — N183 Chronic kidney disease, stage 3 unspecified: Secondary | ICD-10-CM | POA: Diagnosis present

## 2019-03-27 DIAGNOSIS — I10 Essential (primary) hypertension: Secondary | ICD-10-CM

## 2019-03-27 DIAGNOSIS — Z20828 Contact with and (suspected) exposure to other viral communicable diseases: Secondary | ICD-10-CM | POA: Diagnosis present

## 2019-03-27 DIAGNOSIS — Z7984 Long term (current) use of oral hypoglycemic drugs: Secondary | ICD-10-CM

## 2019-03-27 DIAGNOSIS — Z7902 Long term (current) use of antithrombotics/antiplatelets: Secondary | ICD-10-CM

## 2019-03-27 HISTORY — DX: Heart failure, unspecified: I50.9

## 2019-03-27 LAB — COMPREHENSIVE METABOLIC PANEL
ALT: 10 U/L (ref 0–44)
AST: 19 U/L (ref 15–41)
Albumin: 3.3 g/dL — ABNORMAL LOW (ref 3.5–5.0)
Alkaline Phosphatase: 77 U/L (ref 38–126)
Anion gap: 8 (ref 5–15)
BUN: 31 mg/dL — ABNORMAL HIGH (ref 8–23)
CO2: 22 mmol/L (ref 22–32)
Calcium: 8.3 mg/dL — ABNORMAL LOW (ref 8.9–10.3)
Chloride: 107 mmol/L (ref 98–111)
Creatinine, Ser: 2.94 mg/dL — ABNORMAL HIGH (ref 0.61–1.24)
GFR calc Af Amer: 22 mL/min — ABNORMAL LOW (ref >60)
GFR calc non Af Amer: 19 mL/min — ABNORMAL LOW (ref >60)
Glucose, Bld: 168 mg/dL — ABNORMAL HIGH (ref 70–99)
Potassium: 3.6 mmol/L (ref 3.5–5.1)
Sodium: 137 mmol/L (ref 135–145)
Total Bilirubin: 0.7 mg/dL (ref 0.3–1.2)
Total Protein: 6.3 g/dL — ABNORMAL LOW (ref 6.5–8.1)

## 2019-03-27 LAB — URINALYSIS, ROUTINE W REFLEX MICROSCOPIC
Bacteria, UA: NONE SEEN
Bilirubin Urine: NEGATIVE
Glucose, UA: 50 mg/dL — AB
Hgb urine dipstick: NEGATIVE
Ketones, ur: NEGATIVE mg/dL
Leukocytes,Ua: NEGATIVE
Nitrite: NEGATIVE
Protein, ur: >=300 mg/dL — AB
Specific Gravity, Urine: 1.013 (ref 1.005–1.030)
pH: 5 (ref 5.0–8.0)

## 2019-03-27 LAB — CBC WITH DIFFERENTIAL/PLATELET
Abs Immature Granulocytes: 0.01 10*3/uL (ref 0.00–0.07)
Basophils Absolute: 0 10*3/uL (ref 0.0–0.1)
Basophils Relative: 0 %
Eosinophils Absolute: 0.2 10*3/uL (ref 0.0–0.5)
Eosinophils Relative: 3 %
HCT: 30.8 % — ABNORMAL LOW (ref 39.0–52.0)
Hemoglobin: 9.9 g/dL — ABNORMAL LOW (ref 13.0–17.0)
Immature Granulocytes: 0 %
Lymphocytes Relative: 15 %
Lymphs Abs: 0.9 10*3/uL (ref 0.7–4.0)
MCH: 26.7 pg (ref 26.0–34.0)
MCHC: 32.1 g/dL (ref 30.0–36.0)
MCV: 83 fL (ref 80.0–100.0)
Monocytes Absolute: 0.4 10*3/uL (ref 0.1–1.0)
Monocytes Relative: 7 %
Neutro Abs: 4.3 10*3/uL (ref 1.7–7.7)
Neutrophils Relative %: 75 %
Platelets: 276 10*3/uL (ref 150–400)
RBC: 3.71 MIL/uL — ABNORMAL LOW (ref 4.22–5.81)
RDW: 14.2 % (ref 11.5–15.5)
WBC: 5.7 10*3/uL (ref 4.0–10.5)
nRBC: 0 % (ref 0.0–0.2)

## 2019-03-27 NOTE — ED Triage Notes (Signed)
Patient with hypertension today.  He states that his BP was over 169 systolic when he took it at home and he states that he is compliant at home.  He states that he has been having some shortness of breath off and on for the last few weeks.  He has been going to his kidney and heart doctor of late.

## 2019-03-28 ENCOUNTER — Encounter (HOSPITAL_COMMUNITY): Payer: Self-pay | Admitting: General Practice

## 2019-03-28 ENCOUNTER — Observation Stay (HOSPITAL_COMMUNITY): Payer: Medicare Other

## 2019-03-28 ENCOUNTER — Other Ambulatory Visit: Payer: Self-pay

## 2019-03-28 DIAGNOSIS — Z8546 Personal history of malignant neoplasm of prostate: Secondary | ICD-10-CM | POA: Diagnosis not present

## 2019-03-28 DIAGNOSIS — I340 Nonrheumatic mitral (valve) insufficiency: Secondary | ICD-10-CM | POA: Diagnosis not present

## 2019-03-28 DIAGNOSIS — I5031 Acute diastolic (congestive) heart failure: Secondary | ICD-10-CM | POA: Diagnosis present

## 2019-03-28 DIAGNOSIS — Z833 Family history of diabetes mellitus: Secondary | ICD-10-CM | POA: Diagnosis not present

## 2019-03-28 DIAGNOSIS — Z7984 Long term (current) use of oral hypoglycemic drugs: Secondary | ICD-10-CM | POA: Diagnosis not present

## 2019-03-28 DIAGNOSIS — E0821 Diabetes mellitus due to underlying condition with diabetic nephropathy: Secondary | ICD-10-CM | POA: Diagnosis not present

## 2019-03-28 DIAGNOSIS — T502X5A Adverse effect of carbonic-anhydrase inhibitors, benzothiadiazides and other diuretics, initial encounter: Secondary | ICD-10-CM | POA: Diagnosis present

## 2019-03-28 DIAGNOSIS — I10 Essential (primary) hypertension: Secondary | ICD-10-CM

## 2019-03-28 DIAGNOSIS — Z87891 Personal history of nicotine dependence: Secondary | ICD-10-CM | POA: Diagnosis not present

## 2019-03-28 DIAGNOSIS — Z8249 Family history of ischemic heart disease and other diseases of the circulatory system: Secondary | ICD-10-CM | POA: Diagnosis not present

## 2019-03-28 DIAGNOSIS — E785 Hyperlipidemia, unspecified: Secondary | ICD-10-CM | POA: Diagnosis present

## 2019-03-28 DIAGNOSIS — D500 Iron deficiency anemia secondary to blood loss (chronic): Secondary | ICD-10-CM

## 2019-03-28 DIAGNOSIS — Z79899 Other long term (current) drug therapy: Secondary | ICD-10-CM | POA: Diagnosis not present

## 2019-03-28 DIAGNOSIS — I6529 Occlusion and stenosis of unspecified carotid artery: Secondary | ICD-10-CM | POA: Diagnosis not present

## 2019-03-28 DIAGNOSIS — E1122 Type 2 diabetes mellitus with diabetic chronic kidney disease: Secondary | ICD-10-CM | POA: Diagnosis present

## 2019-03-28 DIAGNOSIS — I160 Hypertensive urgency: Secondary | ICD-10-CM | POA: Diagnosis present

## 2019-03-28 DIAGNOSIS — K219 Gastro-esophageal reflux disease without esophagitis: Secondary | ICD-10-CM | POA: Diagnosis present

## 2019-03-28 DIAGNOSIS — Z20828 Contact with and (suspected) exposure to other viral communicable diseases: Secondary | ICD-10-CM | POA: Diagnosis present

## 2019-03-28 DIAGNOSIS — Z9862 Peripheral vascular angioplasty status: Secondary | ICD-10-CM | POA: Diagnosis not present

## 2019-03-28 DIAGNOSIS — I361 Nonrheumatic tricuspid (valve) insufficiency: Secondary | ICD-10-CM | POA: Diagnosis not present

## 2019-03-28 DIAGNOSIS — N183 Chronic kidney disease, stage 3 unspecified: Secondary | ICD-10-CM | POA: Diagnosis present

## 2019-03-28 DIAGNOSIS — I130 Hypertensive heart and chronic kidney disease with heart failure and stage 1 through stage 4 chronic kidney disease, or unspecified chronic kidney disease: Secondary | ICD-10-CM | POA: Diagnosis present

## 2019-03-28 DIAGNOSIS — N179 Acute kidney failure, unspecified: Secondary | ICD-10-CM | POA: Diagnosis present

## 2019-03-28 DIAGNOSIS — Z7902 Long term (current) use of antithrombotics/antiplatelets: Secondary | ICD-10-CM | POA: Diagnosis not present

## 2019-03-28 LAB — BASIC METABOLIC PANEL
Anion gap: 13 (ref 5–15)
Anion gap: 10 (ref 5–15)
BUN: 28 mg/dL — ABNORMAL HIGH (ref 8–23)
BUN: 30 mg/dL — ABNORMAL HIGH (ref 8–23)
CO2: 20 mmol/L — ABNORMAL LOW (ref 22–32)
CO2: 24 mmol/L (ref 22–32)
Calcium: 8.5 mg/dL — ABNORMAL LOW (ref 8.9–10.3)
Calcium: 8.6 mg/dL — ABNORMAL LOW (ref 8.9–10.3)
Chloride: 107 mmol/L (ref 98–111)
Chloride: 106 mmol/L (ref 98–111)
Creatinine, Ser: 2.59 mg/dL — ABNORMAL HIGH (ref 0.61–1.24)
Creatinine, Ser: 3.3 mg/dL — ABNORMAL HIGH (ref 0.61–1.24)
GFR calc Af Amer: 26 mL/min — ABNORMAL LOW (ref >60)
GFR calc Af Amer: 19 mL/min — ABNORMAL LOW (ref >60)
GFR calc non Af Amer: 22 mL/min — ABNORMAL LOW (ref >60)
GFR calc non Af Amer: 16 mL/min — ABNORMAL LOW (ref >60)
Glucose, Bld: 132 mg/dL — ABNORMAL HIGH (ref 70–99)
Glucose, Bld: 145 mg/dL — ABNORMAL HIGH (ref 70–99)
Potassium: 3.3 mmol/L — ABNORMAL LOW (ref 3.5–5.1)
Potassium: 3.4 mmol/L — ABNORMAL LOW (ref 3.5–5.1)
Sodium: 140 mmol/L (ref 135–145)
Sodium: 140 mmol/L (ref 135–145)

## 2019-03-28 LAB — GLUCOSE, CAPILLARY
Glucose-Capillary: 126 mg/dL — ABNORMAL HIGH (ref 70–99)
Glucose-Capillary: 140 mg/dL — ABNORMAL HIGH (ref 70–99)

## 2019-03-28 LAB — CBC
HCT: 33.8 % — ABNORMAL LOW (ref 39.0–52.0)
Hemoglobin: 10.8 g/dL — ABNORMAL LOW (ref 13.0–17.0)
MCH: 26.5 pg (ref 26.0–34.0)
MCHC: 32 g/dL (ref 30.0–36.0)
MCV: 83 fL (ref 80.0–100.0)
Platelets: 286 10*3/uL (ref 150–400)
RBC: 4.07 MIL/uL — ABNORMAL LOW (ref 4.22–5.81)
RDW: 14 % (ref 11.5–15.5)
WBC: 12.3 10*3/uL — ABNORMAL HIGH (ref 4.0–10.5)
nRBC: 0 % (ref 0.0–0.2)

## 2019-03-28 LAB — MAGNESIUM: Magnesium: 2.3 mg/dL (ref 1.7–2.4)

## 2019-03-28 LAB — HEMOGLOBIN A1C
Hgb A1c MFr Bld: 6.3 % — ABNORMAL HIGH (ref 4.8–5.6)
Mean Plasma Glucose: 134.11 mg/dL

## 2019-03-28 LAB — BRAIN NATRIURETIC PEPTIDE: B Natriuretic Peptide: 703.7 pg/mL — ABNORMAL HIGH (ref 0.0–100.0)

## 2019-03-28 LAB — SARS CORONAVIRUS 2 BY RT PCR (HOSPITAL ORDER, PERFORMED IN ~~LOC~~ HOSPITAL LAB): SARS Coronavirus 2: NEGATIVE

## 2019-03-28 LAB — CBG MONITORING, ED
Glucose-Capillary: 114 mg/dL — ABNORMAL HIGH (ref 70–99)
Glucose-Capillary: 92 mg/dL (ref 70–99)

## 2019-03-28 LAB — TROPONIN I (HIGH SENSITIVITY)
Troponin I (High Sensitivity): 29 ng/L — ABNORMAL HIGH (ref <18)
Troponin I (High Sensitivity): 29 ng/L — ABNORMAL HIGH (ref <18)
Troponin I (High Sensitivity): 40 ng/L — ABNORMAL HIGH (ref <18)

## 2019-03-28 LAB — SARS CORONAVIRUS 2 (TAT 6-24 HRS)

## 2019-03-28 MED ORDER — POTASSIUM CHLORIDE 20 MEQ/15ML (10%) PO SOLN
40.0000 meq | Freq: Once | ORAL | Status: AC
  Administered 2019-03-28: 40 meq via ORAL
  Filled 2019-03-28: qty 30

## 2019-03-28 MED ORDER — CLOPIDOGREL BISULFATE 75 MG PO TABS
75.0000 mg | ORAL_TABLET | Freq: Every day | ORAL | Status: DC
  Administered 2019-03-28 – 2019-03-29 (×2): 75 mg via ORAL
  Filled 2019-03-28 (×2): qty 1

## 2019-03-28 MED ORDER — HEPARIN SODIUM (PORCINE) 5000 UNIT/ML IJ SOLN
5000.0000 [IU] | Freq: Three times a day (TID) | INTRAMUSCULAR | Status: DC
  Administered 2019-03-28 – 2019-03-29 (×4): 5000 [IU] via SUBCUTANEOUS
  Filled 2019-03-28 (×4): qty 1

## 2019-03-28 MED ORDER — ATORVASTATIN CALCIUM 10 MG PO TABS
20.0000 mg | ORAL_TABLET | Freq: Every day | ORAL | Status: DC
  Administered 2019-03-28 – 2019-03-29 (×2): 20 mg via ORAL
  Filled 2019-03-28 (×2): qty 2

## 2019-03-28 MED ORDER — AMLODIPINE BESYLATE 10 MG PO TABS
10.0000 mg | ORAL_TABLET | Freq: Every day | ORAL | Status: DC
  Administered 2019-03-28 – 2019-03-29 (×2): 10 mg via ORAL
  Filled 2019-03-28: qty 1
  Filled 2019-03-28: qty 2

## 2019-03-28 MED ORDER — ISOSORB DINITRATE-HYDRALAZINE 20-37.5 MG PO TABS
1.0000 | ORAL_TABLET | Freq: Four times a day (QID) | ORAL | Status: DC
  Administered 2019-03-28 – 2019-03-29 (×5): 1 via ORAL
  Filled 2019-03-28 (×4): qty 1

## 2019-03-28 MED ORDER — ACETAMINOPHEN 325 MG PO TABS: 650.0000 mg | ORAL_TABLET | Freq: Four times a day (QID) | ORAL | Status: DC | PRN

## 2019-03-28 MED ORDER — HYDROCORTISONE 2.5 % RE CREA
1.0000 "application " | TOPICAL_CREAM | Freq: Two times a day (BID) | RECTAL | Status: DC
  Filled 2019-03-28: qty 28.35

## 2019-03-28 MED ORDER — METOPROLOL SUCCINATE ER 100 MG PO TB24
100.0000 mg | ORAL_TABLET | Freq: Every day | ORAL | Status: DC
  Administered 2019-03-28 – 2019-03-29 (×2): 100 mg via ORAL
  Filled 2019-03-28 (×2): qty 1

## 2019-03-28 MED ORDER — ONDANSETRON HCL 4 MG PO TABS: 4.0000 mg | ORAL_TABLET | Freq: Four times a day (QID) | ORAL | Status: DC | PRN

## 2019-03-28 MED ORDER — INSULIN ASPART 100 UNIT/ML ~~LOC~~ SOLN
0.0000 [IU] | Freq: Three times a day (TID) | SUBCUTANEOUS | Status: DC
  Administered 2019-03-28: 1 [IU] via SUBCUTANEOUS

## 2019-03-28 MED ORDER — INSULIN ASPART 100 UNIT/ML ~~LOC~~ SOLN: 0.0000 [IU] | Freq: Every day | SUBCUTANEOUS | Status: DC

## 2019-03-28 MED ORDER — HYDRALAZINE HCL 20 MG/ML IJ SOLN
10.0000 mg | Freq: Once | INTRAMUSCULAR | Status: AC
  Administered 2019-03-28: 10 mg via INTRAVENOUS
  Filled 2019-03-28: qty 1

## 2019-03-28 MED ORDER — FUROSEMIDE 10 MG/ML IJ SOLN
40.0000 mg | Freq: Once | INTRAMUSCULAR | Status: AC
  Administered 2019-03-28: 40 mg via INTRAVENOUS
  Filled 2019-03-28: qty 4

## 2019-03-28 MED ORDER — LOSARTAN POTASSIUM 50 MG PO TABS
50.0000 mg | ORAL_TABLET | Freq: Two times a day (BID) | ORAL | Status: DC
  Administered 2019-03-28 (×2): 50 mg via ORAL
  Filled 2019-03-28 (×2): qty 1

## 2019-03-28 MED ORDER — DOXAZOSIN MESYLATE 2 MG PO TABS
2.0000 mg | ORAL_TABLET | Freq: Every day | ORAL | Status: DC
  Administered 2019-03-29: 2 mg via ORAL
  Filled 2019-03-28 (×3): qty 1

## 2019-03-28 MED ORDER — ONDANSETRON HCL 4 MG/2ML IJ SOLN: 4.0000 mg | Freq: Four times a day (QID) | INTRAMUSCULAR | Status: DC | PRN

## 2019-03-28 MED ORDER — CLONIDINE HCL 0.1 MG PO TABS: 0.1000 mg | ORAL_TABLET | Freq: Three times a day (TID) | ORAL | Status: DC

## 2019-03-28 MED ORDER — CLONIDINE HCL 0.2 MG PO TABS
0.2000 mg | ORAL_TABLET | Freq: Three times a day (TID) | ORAL | Status: DC
  Administered 2019-03-28 – 2019-03-29 (×5): 0.2 mg via ORAL
  Filled 2019-03-28 (×5): qty 1

## 2019-03-28 MED ORDER — HYDRALAZINE HCL 20 MG/ML IJ SOLN
5.0000 mg | INTRAMUSCULAR | Status: DC | PRN
  Administered 2019-03-28 (×2): 5 mg via INTRAVENOUS
  Filled 2019-03-28 (×2): qty 1

## 2019-03-28 MED ORDER — FUROSEMIDE 10 MG/ML IJ SOLN
40.0000 mg | Freq: Two times a day (BID) | INTRAMUSCULAR | Status: DC
  Administered 2019-03-28: 40 mg via INTRAVENOUS
  Filled 2019-03-28: qty 4

## 2019-03-28 MED ORDER — ACETAMINOPHEN 650 MG RE SUPP: 650.0000 mg | Freq: Four times a day (QID) | RECTAL | Status: DC | PRN

## 2019-03-28 MED ORDER — PANTOPRAZOLE SODIUM 40 MG PO TBEC
40.0000 mg | DELAYED_RELEASE_TABLET | Freq: Every day | ORAL | Status: DC
  Administered 2019-03-28 – 2019-03-29 (×2): 40 mg via ORAL
  Filled 2019-03-28 (×2): qty 1

## 2019-03-28 NOTE — ED Provider Notes (Signed)
TIME SEEN: 2:20 AM  CHIEF COMPLAINT: Hypertension, shortness of breath  HPI: Patient is an 84 year old male with history of hypertension, hyperlipidemia, diabetes, chronic kidney disease who presents to the emergency department with complaints of elevated blood pressure and shortness of breath.  States tonight he was not feeling well had a mild tightness in his head and felt short of breath worse with exertion or worse with lying flat.  States he checked his blood pressure and it was in the 299B systolic.  Reports he is on 2 blood pressure medications regularly and was started on a third by his nephrologist last week.  He does not recall the name of any of these medications.  Also reports he is on Lasix 40 mg 3 times a day and took his last dose today.  He denies fevers or cough.  No chest pain or chest discomfort.  Reports he always has some swelling in both of his legs but it seems to be increased compared to baseline.  No calf tenderness on exam.  ROS: See HPI Constitutional: no fever  Eyes: no drainage  ENT: no runny nose   Cardiovascular:  no chest pain  Resp:  SOB  GI: no vomiting GU: no dysuria Integumentary: no rash  Allergy: no hives  Musculoskeletal: no leg swelling  Neurological: no slurred speech ROS otherwise negative  PAST MEDICAL HISTORY/PAST SURGICAL HISTORY:  Past Medical History:  Diagnosis Date  . 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 CAROTID ARTERY STENOSIS 05/10/2010  . DIABETES MELLITUS, TYPE II 09/19/2007  . DISEASE, CEREBROVASCULAR NEC 03/05/2007  . GERD 03/13/2007  . HYPERLIPIDEMIA 03/05/2007  . HYPERTENSION 03/05/2007  . HYPOKALEMIA 11/09/2009  . KNEE PAIN, RIGHT 11/09/2009  . PROSTATE CANCER, HX OF 03/05/2007  . RENAL DISEASE, CHRONIC 02/03/2009    MEDICATIONS:  Prior to Admission medications   Medication Sig Start Date End Date Taking? Authorizing Provider  amLODipine (NORVASC) 10 MG tablet TAKE 1 TABLET BY MOUTH EVERY DAY 01/25/19   Nafziger, Tommi Rumps, NP   atorvastatin (LIPITOR) 20 MG tablet TAKE 1 TABLET BY MOUTH EVERY DAY 01/25/19   Nafziger, Tommi Rumps, NP  clopidogrel (PLAVIX) 75 MG tablet TAKE 1 TABLET EVERY DAY Patient taking differently: Take 75 mg by mouth daily.  06/21/18   Nafziger, Tommi Rumps, NP  doxazosin (CARDURA) 2 MG tablet Take 2 mg by mouth at bedtime.  12/28/17   [provider]  esomeprazole (NEXIUM) 40 MG capsule TAKE 1 CAPSULE BY MOUTH EVERY DAY Patient taking differently: Take 40 mg by mouth daily at 12 noon.  05/03/18   Marletta Lor, MD  furosemide (LASIX) 20 MG tablet Take 40 mg by mouth 3 (three) times a week. 01/15/19   Claudia Desanctis, MD  glipiZIDE (GLUCOTROL XL) 5 MG 24 hr tablet TAKE 1 TABLET BY MOUTH EVERY DAY 03/12/19   Nafziger, Tommi Rumps, NP  glucose blood (ONETOUCH VERIO) test strip USE TO TEST BLOOD GLUCOSE ONCE DAILY 03/08/19   Nafziger, Tommi Rumps, NP  hydrocortisone (ANUSOL-HC) 2.5 % rectal cream Place 1 application rectally 2 (two) times daily. 11/13/18   Nafziger, Tommi Rumps, NP  isosorbide-hydrALAZINE (BIDIL) 20-37.5 MG tablet Take 1 tablet by mouth 2 (two) times daily.    [provider]  KLOR-CON M10 10 MEQ tablet Take 20 mEq by mouth daily.  12/26/18   [provider]  losartan (COZAAR) 100 MG tablet TAKE 1 TABLET BY MOUTH EVERY DAY 02/14/19   Nafziger, Tommi Rumps, NP  metoprolol succinate (TOPROL-XL) 25 MG 24 hr tablet  TAKE 2 TABLETS (50 MG TOTAL) BY MOUTH DAILY. Patient taking differently: Take 100 mg by mouth daily.  09/19/17   Marletta Lor, MD  nystatin cream (MYCOSTATIN) Apply 1 application topically daily. APPLY TO DENTURES 07/19/17   [provider]  Jonetta Speak LANCETS 65Y MISC USE TO CHECK BLOOD SUGAR DAILY AND AS NEEDED 12/18/17   Marletta Lor, MD  pramoxine-hydrocortisone (PROCTOCREAM-HC) 1-1 % rectal cream Place 1 application rectally 2 (two) times daily. 05/03/18   Marletta Lor, MD    ALLERGIES:  Allergies  Allergen Reactions  . Aspirin Other (See Comments)     High doses causes stomach ulcer and bleeding    SOCIAL HISTORY:  Social History   Tobacco Use  . Smoking status: Former Smoker    Types: Cigarettes    Quit date: 08/22/1974    Years since quitting: 44.6  . Smokeless tobacco: Never Used  Substance Use Topics  . Alcohol use: No    Alcohol/week: 0.0 standard drinks    FAMILY HISTORY: Family History  Problem Relation Age of Onset  . Hypertension Mother   . Cancer Father        Mesothelioma   . Stomach cancer Brother   . Cancer Brother   . Cancer - Cervical Brother   . Cancer Brother   . Diabetes Brother   . Esophageal cancer Neg Hx     EXAM: BP (!) 212/89   Pulse 70   Temp 98.6 F (37 C) (Oral)   Resp 18   Ht 5\' 7"  (1.702 m)   Wt 90.7 kg   SpO2 99%   BMI 31.32 kg/m  CONSTITUTIONAL: Alert and oriented and responds appropriately to questions.  Elderly HEAD: Normocephalic EYES: Conjunctivae clear, pupils appear equal, EOMI ENT: normal nose; moist mucous membranes NECK: Supple, no meningismus, no nuchal rigidity, no LAD  CARD: RRR; S1 and S2 appreciated; no murmurs, no clicks, no rubs, no gallops RESP: Normal chest excursion without splinting or tachypnea; breath sounds equal bilaterally, no rhonchi, patient does have bibasilar Rales and bibasilar expiratory wheezes, no hypoxia or respiratory distress, speaking full sentences ABD/GI: Normal bowel sounds; non-distended; soft, non-tender, no rebound, no guarding, no peritoneal signs, no hepatosplenomegaly BACK:  The back appears normal and is non-tender to palpation, there is no CVA tenderness EXT: Normal ROM in all joints; non-tender to palpation; 1+ pitting edema in bilateral distal lower extremities; normal capillary refill; no cyanosis, no calf tenderness or swelling    SKIN: Normal color for age and race; warm; no rash NEURO: Moves all extremities equally PSYCH: The patient's mood and manner are appropriate. Grooming and personal hygiene are appropriate.  MEDICAL  DECISION MAKING: Patient here with possible CHF exacerbation.  He denies known history of CHF but is on Lasix regularly.  We do not have echocardiogram in our system.  Chest x-ray concerning for mild CHF.  He is also hypertensive here which may be a contributing factor to his volume overload today.  Will give IV Lasix, IV hydralazine.  He has no chest pain or chest discomfort.  EKG shows no new ischemic changes compared to previous.  Will admit to medicine.  Patient comfortable with this plan.  ED PROGRESS:   2:41 AM Discussed patient's case with hospitalist, Dr. Blaine Hamper.  I have recommended admission and patient (and family if present) agree with this plan. Admitting physician will place admission orders.   I reviewed all nursing notes, vitals, pertinent previous records, EKGs, lab and urine results, imaging (  as available).     EKG Interpretation  Date/Time:  Wednesday March 27 2019 20:31:08 EDT Ventricular Rate:  66 PR Interval:  246 QRS Duration: 98 QT Interval:  402 QTC Calculation: 421 R Axis:   -27 Text Interpretation:  Sinus rhythm with 1st degree A-V block Minimal voltage criteria for LVH, may be normal variant ST & T wave abnormality, consider inferolateral ischemia Abnormal ECG When compared with ECG of EARLIER SAME DATE No significant change was found Confirmed by Delora Fuel ([id number]) on 03/28/2019 12:59:21 AM         Shakeila Pfarr, Delice Bison, DO 03/28/19 0340

## 2019-03-28 NOTE — Progress Notes (Signed)
Patients BMP resulted and paged Oncall MD regarding holding evening iv lasix. Got orders to hold iv lasix and give 3mEq of potassium.   Received phone call from Dr. Lonny Prude to discontinue to IV lasix. Will continue to monitor patient.

## 2019-03-28 NOTE — ED Notes (Signed)
IV team at bedside 

## 2019-03-28 NOTE — ED Notes (Signed)
Secure message sent to Dr. Lonny Prude, concerning continued elevated BP, last BP 191/88. New orders placed

## 2019-03-28 NOTE — ED Notes (Signed)
Lunch tray ordered 

## 2019-03-28 NOTE — H&P (Addendum)
History and Physical    Austin [name] IHK:[id number] DOB: December 02, 1934 DOA: 03/27/2019  Referring MD/NP/PA:   PCP: Dorothyann Peng, NP   Patient coming from:  The patient is coming from home.  At baseline, pt is independent for most of ADL.        Chief Complaint: SOB  HPI: Austin [name] is a 84 y.o. male with medical history significant of hypertension, hyperlipidemia, diabetes mellitus, CKD stage III, prostate cancer, anemia, carotid artery stenosis, possible CHF, who presents with shortness of breath.  Patient states that he has been having shortness of breath in the past several days, which has worsened today.  Patient does not have chest pain.  He has mild dry cough, but no fever or chills.  Patient states that his blood pressure has not been well controlled.  He is currently taking metoprolol, Cozaar, Lasix, amlodipine and clonidine. He is also on Bidil and Cardura  Patient does not have nausea vomiting, diarrhea, abdominal pain, symptoms of UTI or unilateral weakness.  He had headache earlier, which is resolved currently.  ED Course: pt was found to have WBC 5.7, negative urinalysis, slightly worsening renal function, temperature normal, blood pressure 212/89, no tachycardia, oxygen saturation 92% on room air.  Chest x-ray showed mild CHF.  Patient is placed on telemetry bed for observation.  Review of Systems:   General: no fevers, chills, no body weight gain,  has fatigue HEENT: no blurry vision, hearing changes or sore throat Respiratory: has dyspnea, coughing, no wheezing CV: no chest pain, no palpitations GI: no nausea, vomiting, abdominal pain, diarrhea, constipation GU: no dysuria, burning on urination, increased urinary frequency, hematuria  Ext: has leg edema Neuro: no unilateral weakness, numbness, or tingling, no vision change or hearing loss Skin: no rash, no skin tear. MSK: No muscle spasm, no deformity, no limitation of range of movement in spin Heme: No easy bruising.   Travel history: No recent long distant travel.  Allergy:  Allergies  Allergen Reactions  . Aspirin Other (See Comments)    High doses causes stomach ulcer and bleeding    Past Medical History:  Diagnosis Date  . 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 CAROTID ARTERY STENOSIS 05/10/2010  . DIABETES MELLITUS, TYPE II 09/19/2007  . DISEASE, CEREBROVASCULAR NEC 03/05/2007  . GERD 03/13/2007  . HYPERLIPIDEMIA 03/05/2007  . HYPERTENSION 03/05/2007  . HYPOKALEMIA 11/09/2009  . KNEE PAIN, RIGHT 11/09/2009  . PROSTATE CANCER, HX OF 03/05/2007  . RENAL DISEASE, CHRONIC 02/03/2009    Past Surgical History:  Procedure Laterality Date  . CAROTID ARTERY ANGIOPLASTY Right Oct. 10, 2001  . ESOPHAGOGASTRODUODENOSCOPY (EGD) WITH PROPOFOL N/A 11/04/2016   Procedure: ESOPHAGOGASTRODUODENOSCOPY (EGD) WITH PROPOFOL;  Surgeon: Otis Brace, MD;  Location: [facility];  Service: Gastroenterology;  Laterality: N/A;  . LAPAROSCOPIC APPENDECTOMY  02/20/2012   Procedure: APPENDECTOMY LAPAROSCOPIC;  Surgeon: Stark Klein, MD;  Location: [facility];  Service: General;  Laterality: N/A;  . PROSTATE SURGERY     prostatectomy    Social History:  reports that he quit smoking about 44 years ago. His smoking use included cigarettes. He has never used smokeless tobacco. He reports that he does not drink alcohol or use drugs.  Family History:  Family History  Problem Relation Age of Onset  . Hypertension Mother   . Cancer Father        Mesothelioma   . Stomach cancer Brother   . Cancer Brother   . Cancer - Cervical Brother   . Cancer  Brother   . Diabetes Brother   . Esophageal cancer Neg Hx      Prior to Admission medications   Medication Sig Start Date End Date Taking? Authorizing Provider  amLODipine (NORVASC) 10 MG tablet TAKE 1 TABLET BY MOUTH EVERY DAY 01/25/19   Nafziger, Tommi Rumps, NP  atorvastatin (LIPITOR) 20 MG tablet TAKE 1 TABLET BY MOUTH EVERY DAY 01/25/19   Nafziger, Tommi Rumps, NP  clopidogrel (PLAVIX) 75  MG tablet TAKE 1 TABLET EVERY DAY Patient taking differently: Take 75 mg by mouth daily.  06/21/18   Nafziger, Tommi Rumps, NP  doxazosin (CARDURA) 2 MG tablet Take 2 mg by mouth at bedtime.  12/28/17   [provider]  esomeprazole (NEXIUM) 40 MG capsule TAKE 1 CAPSULE BY MOUTH EVERY DAY Patient taking differently: Take 40 mg by mouth daily at 12 noon.  05/03/18   Marletta Lor, MD  furosemide (LASIX) 20 MG tablet Take 40 mg by mouth 3 (three) times a week. 01/15/19   Claudia Desanctis, MD  glipiZIDE (GLUCOTROL XL) 5 MG 24 hr tablet TAKE 1 TABLET BY MOUTH EVERY DAY 03/12/19   Nafziger, Tommi Rumps, NP  glucose blood (ONETOUCH VERIO) test strip USE TO TEST BLOOD GLUCOSE ONCE DAILY 03/08/19   Nafziger, Tommi Rumps, NP  hydrocortisone (ANUSOL-HC) 2.5 % rectal cream Place 1 application rectally 2 (two) times daily. 11/13/18   Nafziger, Tommi Rumps, NP  isosorbide-hydrALAZINE (BIDIL) 20-37.5 MG tablet Take 1 tablet by mouth 2 (two) times daily.    [provider]  KLOR-CON M10 10 MEQ tablet Take 20 mEq by mouth daily.  12/26/18   [provider]  losartan (COZAAR) 100 MG tablet TAKE 1 TABLET BY MOUTH EVERY DAY 02/14/19   Nafziger, Tommi Rumps, NP  metoprolol succinate (TOPROL-XL) 25 MG 24 hr tablet TAKE 2 TABLETS (50 MG TOTAL) BY MOUTH DAILY. Patient taking differently: Take 100 mg by mouth daily.  09/19/17   Marletta Lor, MD  nystatin cream (MYCOSTATIN) Apply 1 application topically daily. APPLY TO DENTURES 07/19/17   [provider]  Jonetta Speak LANCETS 78I MISC USE TO CHECK BLOOD SUGAR DAILY AND AS NEEDED 12/18/17   Marletta Lor, MD  pramoxine-hydrocortisone (PROCTOCREAM-HC) 1-1 % rectal cream Place 1 application rectally 2 (two) times daily. 05/03/18   Marletta Lor, MD    Physical Exam: Vitals:   03/28/19 0209 03/28/19 0209 03/28/19 0215 03/28/19 0301  BP: (!) 212/89  (!) 211/92 (!) 211/92  Pulse: 70     Resp: 18     Temp:      TempSrc:      SpO2: 99%     Weight:  90.7  kg    Height:  5\' 7"  (1.702 m)     General: Not in acute distress HEENT:       Eyes: PERRL, EOMI, no scleral icterus.       ENT: No discharge from the ears and nose, no pharynx injection, no tonsillar enlargement.        Neck: positive JVD, no bruit, no mass felt. Heme: No neck lymph node enlargement. Cardiac: S1/S2, RRR, No murmurs, No gallops or rubs. Respiratory:  No rales, wheezing, rhonchi or rubs. GI: Soft, nondistended, nontender, no rebound pain, no organomegaly, BS present. GU: No hematuria Ext: has 1+ pitting leg edema bilaterally. 2+DP/PT pulse bilaterally. Musculoskeletal: No joint deformities, No joint redness or warmth, no limitation of ROM in spin. Skin: No rashes.  Neuro: Alert, oriented X3, cranial nerves II-XII grossly intact, moves all extremities normally.  Psych: Patient is not psychotic, no suicidal or hemocidal ideation.  Labs on Admission: I have personally reviewed following labs and imaging studies  CBC: Recent Labs  Lab 03/27/19 2030  WBC 5.7  NEUTROABS 4.3  HGB 9.9*  HCT 30.8*  MCV 83.0  PLT 332   Basic Metabolic Panel: Recent Labs  Lab 03/27/19 2030  NA 137  K 3.6  CL 107  CO2 22  GLUCOSE 168*  BUN 31*  CREATININE 2.94*  CALCIUM 8.3*   GFR: Estimated Creatinine Clearance: 20.8 mL/min (A) (by C-G formula based on SCr of 2.94 mg/dL (H)). Liver Function Tests: Recent Labs  Lab 03/27/19 2030  AST 19  ALT 10  ALKPHOS 77  BILITOT 0.7  PROT 6.3*  ALBUMIN 3.3*   No results for input(s): LIPASE, AMYLASE in the last 168 hours. No results for input(s): AMMONIA in the last 168 hours. Coagulation Profile: No results for input(s): INR, PROTIME in the last 168 hours. Cardiac Enzymes: No results for input(s): CKTOTAL, CKMB, CKMBINDEX, TROPONINI in the last 168 hours. BNP (last 3 results) No results for input(s): PROBNP in the last 8760 hours. HbA1C: No results for input(s): HGBA1C in the last 72 hours. CBG: No results for input(s):  GLUCAP in the last 168 hours. Lipid Profile: No results for input(s): CHOL, HDL, LDLCALC, TRIG, CHOLHDL, LDLDIRECT in the last 72 hours. Thyroid Function Tests: No results for input(s): TSH, T4TOTAL, FREET4, T3FREE, THYROIDAB in the last 72 hours. Anemia Panel: No results for input(s): VITAMINB12, FOLATE, FERRITIN, TIBC, IRON, RETICCTPCT in the last 72 hours. Urine analysis:    Component Value Date/Time   COLORURINE YELLOW 03/27/2019 2034   APPEARANCEUR CLEAR 03/27/2019 2034   LABSPEC 1.013 03/27/2019 2034   PHURINE 5.0 03/27/2019 2034   GLUCOSEU 50 (A) 03/27/2019 2034   HGBUR NEGATIVE 03/27/2019 2034   HGBUR negative 09/19/2007 0000   BILIRUBINUR NEGATIVE 03/27/2019 2034   BILIRUBINUR n 11/18/2014 1003   KETONESUR NEGATIVE 03/27/2019 2034   PROTEINUR >=300 (A) 03/27/2019 2034   UROBILINOGEN 0.2 11/18/2014 1003   UROBILINOGEN 1.0 01/26/2013 2059   NITRITE NEGATIVE 03/27/2019 2034   LEUKOCYTESUR NEGATIVE 03/27/2019 2034   Sepsis Labs: @LABRCNTIP (procalcitonin:4,lacticidven:4) )No results found for this or any previous visit (from the past 240 hour(s)).   Radiological Exams on Admission: Dg Chest 2 View  Result Date: 03/27/2019 CLINICAL DATA:  Shortness of breath for several weeks EXAM: CHEST - 2 VIEW COMPARISON:  01/14/2019 FINDINGS: Cardiac shadow is enlarged in size. Aortic calcifications are noted. Increased central vascular congestion is noted without significant interstitial edema. No focal infiltrate or effusion is seen. No bony abnormality is noted. IMPRESSION: Mild CHF without interstitial edema. Electronically Signed   By: Inez Catalina M.D.   On: 03/27/2019 20:55     EKG: Independently reviewed.  Sinus rhythm, QTC 443, LAE, LAD, mild T wave inversion in V4-V6.  Assessment/Plan Principal Problem:   Hypertensive urgency Active Problems:   Diabetes mellitus with renal complications (HCC)   Dyslipidemia   Iron deficiency anemia due to chronic blood loss   Essential  hypertension   Carotid artery stenosis   GERD   CKD (chronic kidney disease), stage III (HCC)   Hypertensive urgency and hx of HTN: Blood pressure 212/89.  Patient states that he is taking his blood pressure consistently.  Denies chest pain. -Will place on telemetry bed for observation -IV hydralazine as needed -Continue home medications: metoprolol, Lasix, amlodipine. He is also on Bidil and Cardura - increase clonidine dose from  0.1 mg twice daily to 0.2 mg 3 times daily - IV hydralazine as needed - hold cozarr due to worsening renal function  Possible CHF with acute exacerbation: Patient does not have history of CHF listed on medical problem list, but he is taking Lasix 3 times a week, and he is also on BiDil. Pt has shortness of breath and bilateral leg edema.  Chest x-ray showed mild CHF, clinically consistent with CHF exacerbation.  No 2D echo on record.  Not sure which type of CHF patient has, but possibly diastolic dysfunction given long history of hypertension. -will start IV lasix 40 mg bid -continue Bidil -trop x 3 -2d echo -Daily weights -strict I/O's -Low salt diet -Fluid restriction  CKD-III: Slightly worsening.  Baseline creatinine 2.4-2.8.  His creatinine is 2.94, BUN 31.  May be due to cardiorenal syndrome and continuation of Cozaar and diuretics. -Hold Cozaar -Follow-up renal function by BMP  Diabetes mellitus with renal complications ([place]): Last A1c 6.6 on 04/01/18, well controled. Patient is taking glipizide at home -SSI  Dyslipidemia: -lipitor  Iron deficiency anemia due to chronic blood loss: hgb 11.0 on 01/14/19 -->9.9, slightly dropped. No active bleeding -Continue iron supplement -Follow-up with CBC  GERD: -protonix  Hx of carotid artery stenosis: - Continue Plavix, Lipitor     DVT ppx: SQ Heparin      Code Status: Full code Family Communication: None at bed side.   Disposition Plan:  Anticipate discharge back to previous home environment  Consults called:  none Admission status: Obs / tele     Date of Service 03/28/2019    Ivor Costa [facility]   If 7PM-7AM, please contact night-coverage [email or web address] Password [email or web address] 03/28/2019, 3:14 AM

## 2019-03-28 NOTE — ED Notes (Signed)
Pt eating breakfast with no c/o at this time

## 2019-03-28 NOTE — Progress Notes (Signed)
Patient seen and examined at bedside, patient admitted after midnight, please see earlier detailed admission note by Ivor Costa, MD. Briefly, patient presented with dyspnea and hypertension. Concern for heart failure exacerbation. Started on IV lasix in addition to continuing home antihypertensive regimen; clonidine increased on admission. Will recheck BMP this evening prior to evening dose of lasix. PT eval ordered. Transthoracic Echocardiogram ordered and result pending.   Cordelia Poche, MD [facility] 03/28/2019, 5:41 PM

## 2019-03-28 NOTE — ED Notes (Signed)
ED TO INPATIENT HANDOFF REPORT  ED Nurse Name and Phone #:  [phone]  S Name/Age/Gender Austin [name] 84 y.o. male Room/Bed: 018C/018C  Code Status   Code Status: Prior  Home/SNF/Other Home Patient oriented to: self, place, time and situation Is this baseline? Yes   Triage Complete: Triage complete  Chief Complaint High Blood Pressure  Triage Note Patient with hypertension today.  He states that his BP was over 940 systolic when he took it at home and he states that he is compliant at home.  He states that he has been having some shortness of breath off and on for the last few weeks.  He has been going to his kidney and heart doctor of late.     Allergies Allergies  Allergen Reactions  . Aspirin Other (See Comments)    High doses causes stomach ulcer and bleeding    Level of Care/Admitting Diagnosis ED Disposition    ED Disposition Condition Comment   Admit  Hospital Area: [facility] [[id number]]  Level of Care: Telemetry Medical [104]  I expect the patient will be discharged within 24 hours: No (not a candidate for 5C-Observation unit)  Covid Evaluation: Asymptomatic Screening Protocol (No Symptoms)  Diagnosis: Hypertensive urgency [768088]  Admitting Physician: Ivor Costa [[id number]]  Attending Physician: Ivor Costa [[id number]]  PT Class (Do Not Modify): Observation [104]  PT Acc Code (Do Not Modify): Observation [10022]       B Medical/Surgery History Past Medical History:  Diagnosis Date  . 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 CAROTID ARTERY STENOSIS 05/10/2010  . DIABETES MELLITUS, TYPE II 09/19/2007  . DISEASE, CEREBROVASCULAR NEC 03/05/2007  . GERD 03/13/2007  . HYPERLIPIDEMIA 03/05/2007  . HYPERTENSION 03/05/2007  . HYPOKALEMIA 11/09/2009  . KNEE PAIN, RIGHT 11/09/2009  . PROSTATE CANCER, HX OF 03/05/2007  . RENAL DISEASE, CHRONIC 02/03/2009   Past Surgical History:  Procedure Laterality Date  . CAROTID ARTERY ANGIOPLASTY Right Oct. 10,  2001  . ESOPHAGOGASTRODUODENOSCOPY (EGD) WITH PROPOFOL N/A 11/04/2016   Procedure: ESOPHAGOGASTRODUODENOSCOPY (EGD) WITH PROPOFOL;  Surgeon: Otis Brace, MD;  Location: [facility];  Service: Gastroenterology;  Laterality: N/A;  . LAPAROSCOPIC APPENDECTOMY  02/20/2012   Procedure: APPENDECTOMY LAPAROSCOPIC;  Surgeon: Stark Klein, MD;  Location: [facility];  Service: General;  Laterality: N/A;  . PROSTATE SURGERY     prostatectomy     A IV Location/Drains/Wounds Patient Lines/Drains/Airways Status   Active Line/Drains/Airways    Name:   Placement date:   Placement time:   Site:   Days:   Peripheral IV 03/28/19 Left Antecubital   03/28/19    0336    Antecubital   less than 1   Airway   11/04/16    1100     874          Intake/Output Last 24 hours  Intake/Output Summary (Last 24 hours) at 03/28/2019 0406 Last data filed at 03/28/2019 0402 Gross per 24 hour  Intake -  Output 525 ml  Net -525 ml    Labs/Imaging Results for orders placed or performed during the hospital encounter of 03/27/19 (from the past 48 hour(s))  CBC with Differential     Status: Abnormal   Collection Time: 03/27/19  8:30 PM  Result Value Ref [place]   WBC 5.7 4.0 - 10.5 K/uL   RBC 3.71 (L) 4.22 - 5.81 MIL/uL   Hemoglobin 9.9 (L) 13.0 - 17.0 g/dL   HCT 30.8 (L) 39.0 - 52.0 %   MCV  83.0 80.0 - 100.0 fL   MCH 26.7 26.0 - 34.0 pg   MCHC 32.1 30.0 - 36.0 g/dL   RDW 14.2 11.5 - 15.5 %   Platelets 276 150 - 400 K/uL   nRBC 0.0 0.0 - 0.2 %   Neutrophils Relative % 75 %   Neutro Abs 4.3 1.7 - 7.7 K/uL   Lymphocytes Relative 15 %   Lymphs Abs 0.9 0.7 - 4.0 K/uL   Monocytes Relative 7 %   Monocytes Absolute 0.4 0.1 - 1.0 K/uL   Eosinophils Relative 3 %   Eosinophils Absolute 0.2 0.0 - 0.5 K/uL   Basophils Relative 0 %   Basophils Absolute 0.0 0.0 - 0.1 K/uL   Immature Granulocytes 0 %   Abs Immature Granulocytes 0.01 0.00 - 0.07 K/uL    Comment: Performed at [facility] [address],  [place], [place] [address]  Comprehensive metabolic panel     Status: Abnormal   Collection Time: 03/27/19  8:30 PM  Result Value Ref [place]   Sodium 137 135 - 145 mmol/L   Potassium 3.6 3.5 - 5.1 mmol/L   Chloride 107 98 - 111 mmol/L   CO2 22 22 - 32 mmol/L   Glucose, Bld 168 (H) 70 - 99 mg/dL   BUN 31 (H) 8 - 23 mg/dL   Creatinine, Ser 2.94 (H) 0.61 - 1.24 mg/dL   Calcium 8.3 (L) 8.9 - 10.3 mg/dL   Total Protein 6.3 (L) 6.5 - 8.1 g/dL   Albumin 3.3 (L) 3.5 - 5.0 g/dL   AST 19 15 - 41 U/L   ALT 10 0 - 44 U/L   Alkaline Phosphatase 77 38 - 126 U/L   Total Bilirubin 0.7 0.3 - 1.2 mg/dL   GFR calc non Af Amer 19 (L) >60 mL/min   GFR calc Af Amer 22 (L) >60 mL/min   Anion gap 8 5 - 15    Comment: Performed at [facility], [place] [address], [place], [place] [address]  Urinalysis, Routine w reflex microscopic     Status: Abnormal   Collection Time: 03/27/19  8:34 PM  Result Value Ref [place]   Color, Urine YELLOW YELLOW   APPearance CLEAR CLEAR   Specific Gravity, Urine 1.013 1.005 - 1.030   pH 5.0 5.0 - 8.0   Glucose, UA 50 (A) NEGATIVE mg/dL   Hgb urine dipstick NEGATIVE NEGATIVE   Bilirubin Urine NEGATIVE NEGATIVE   Ketones, ur NEGATIVE NEGATIVE mg/dL   Protein, ur >=300 (A) NEGATIVE mg/dL   Nitrite NEGATIVE NEGATIVE   Leukocytes,Ua NEGATIVE NEGATIVE   RBC / HPF 0-5 0 - 5 RBC/hpf   WBC, UA 0-5 0 - 5 WBC/hpf   Bacteria, UA NONE SEEN NONE SEEN    Comment: Performed at [facility] [address], [place], [place] [address]   Dg Chest 2 View  Result Date: 03/27/2019 CLINICAL DATA:  Shortness of breath for several weeks EXAM: CHEST - 2 VIEW COMPARISON:  01/14/2019 FINDINGS: Cardiac shadow is enlarged in size. Aortic calcifications are noted. Increased central vascular congestion is noted without significant interstitial edema. No focal infiltrate or effusion is seen. No bony abnormality is noted. IMPRESSION: Mild CHF without interstitial edema. Electronically Signed   By: Inez Catalina M.D.   On: 03/27/2019 20:55    Pending Labs Unresulted Labs (From admission, onward)    Start     Ordered   03/28/19 0500  Magnesium  Tomorrow morning,  R     03/28/19 0307   03/28/19 0303  Brain natriuretic peptide  Once,   STAT     03/28/19 0302   03/28/19 0243  SARS CORONAVIRUS 2 Nasal Swab Aptima Multi Swab  (Asymptomatic/Tier 2 Patients Labs)  Once,   STAT    Question Answer Comment  Is this test for diagnosis or screening Screening   Symptomatic for COVID-19 as defined by CDC No   Hospitalized for COVID-19 No   Admitted to ICU for COVID-19 No   Previously tested for COVID-19 Yes   Resident in a congregate (group) care setting No   Employed in healthcare setting No      03/28/19 0242   Signed and Held  Hemoglobin A1c  Once,   R    Comments: To assess prior glycemic control    Signed and Held   Signed and Held  Basic metabolic panel  Tomorrow morning,   R     Signed and Held   Signed and Held  CBC  Tomorrow morning,   R     Signed and Held          Vitals/Pain Today's Vitals   03/28/19 0215 03/28/19 0301 03/28/19 0322 03/28/19 0401  BP: (!) 211/92 (!) 211/92 (!) 212/93 (!) 177/90  Pulse:   89 81  Resp:   (!) 23 16  Temp:      TempSrc:      SpO2:   94% 94%  Weight:      Height:      PainSc:        Isolation Precautions No active isolations  Medications Medications  hydrALAZINE (APRESOLINE) injection 5 mg (has no administration in time range)  furosemide (LASIX) injection 40 mg (40 mg Intravenous Given 03/28/19 0337)  hydrALAZINE (APRESOLINE) injection 10 mg (10 mg Intravenous Given 03/28/19 8366)    Mobility walks with person assist Low fall risk   Focused Assessments Pulmonary Assessment Handoff:  Lung sounds: Bilateral Breath Sounds: Expiratory wheezes O2 Device: Room Air        R Recommendations: See Admitting Provider Note  Report given to:   Additional Notes: -

## 2019-03-29 DIAGNOSIS — I6529 Occlusion and stenosis of unspecified carotid artery: Secondary | ICD-10-CM

## 2019-03-29 DIAGNOSIS — N183 Chronic kidney disease, stage 3 (moderate): Secondary | ICD-10-CM

## 2019-03-29 LAB — BASIC METABOLIC PANEL
Anion gap: 10 (ref 5–15)
Anion gap: 10 (ref 5–15)
BUN: 33 mg/dL — ABNORMAL HIGH (ref 8–23)
BUN: 33 mg/dL — ABNORMAL HIGH (ref 8–23)
CO2: 23 mmol/L (ref 22–32)
CO2: 23 mmol/L (ref 22–32)
Calcium: 8.3 mg/dL — ABNORMAL LOW (ref 8.9–10.3)
Calcium: 8.5 mg/dL — ABNORMAL LOW (ref 8.9–10.3)
Chloride: 106 mmol/L (ref 98–111)
Chloride: 106 mmol/L (ref 98–111)
Creatinine, Ser: 3.38 mg/dL — ABNORMAL HIGH (ref 0.61–1.24)
Creatinine, Ser: 3.3 mg/dL — ABNORMAL HIGH (ref 0.61–1.24)
GFR calc Af Amer: 19 mL/min — ABNORMAL LOW (ref >60)
GFR calc Af Amer: 19 mL/min — ABNORMAL LOW (ref >60)
GFR calc non Af Amer: 16 mL/min — ABNORMAL LOW (ref >60)
GFR calc non Af Amer: 16 mL/min — ABNORMAL LOW (ref >60)
Glucose, Bld: 144 mg/dL — ABNORMAL HIGH (ref 70–99)
Glucose, Bld: 136 mg/dL — ABNORMAL HIGH (ref 70–99)
Potassium: 3.6 mmol/L (ref 3.5–5.1)
Potassium: 3.6 mmol/L (ref 3.5–5.1)
Sodium: 139 mmol/L (ref 135–145)
Sodium: 139 mmol/L (ref 135–145)

## 2019-03-29 LAB — CBC
HCT: 29.1 % — ABNORMAL LOW (ref 39.0–52.0)
Hemoglobin: 9.6 g/dL — ABNORMAL LOW (ref 13.0–17.0)
MCH: 26.7 pg (ref 26.0–34.0)
MCHC: 33 g/dL (ref 30.0–36.0)
MCV: 80.8 fL (ref 80.0–100.0)
Platelets: 275 10*3/uL (ref 150–400)
RBC: 3.6 MIL/uL — ABNORMAL LOW (ref 4.22–5.81)
RDW: 13.9 % (ref 11.5–15.5)
WBC: 7.4 10*3/uL (ref 4.0–10.5)
nRBC: 0 % (ref 0.0–0.2)

## 2019-03-29 LAB — GLUCOSE, CAPILLARY
Glucose-Capillary: 129 mg/dL — ABNORMAL HIGH (ref 70–99)
Glucose-Capillary: 116 mg/dL — ABNORMAL HIGH (ref 70–99)
Glucose-Capillary: 139 mg/dL — ABNORMAL HIGH (ref 70–99)

## 2019-03-29 LAB — ECHOCARDIOGRAM COMPLETE
Height: 67 in
Weight: 3200 oz

## 2019-03-29 MED ORDER — METOPROLOL SUCCINATE ER 25 MG PO TB24: 100.0000 mg | ORAL_TABLET | Freq: Every day | ORAL | Status: DC

## 2019-03-29 MED ORDER — CLONIDINE HCL 0.2 MG PO TABS: 0.2000 mg | ORAL_TABLET | Freq: Three times a day (TID) | ORAL | 0 refills | Status: DC

## 2019-03-29 NOTE — Discharge Instructions (Addendum)
Heart Failure, Diagnosis  Heart failure means that your heart is not able to pump blood in the right way. This makes it hard for your body to work well. Heart failure is usually a long-term (chronic) condition. You must take good care of yourself and follow your treatment plan from your doctor. What are the causes? This condition may be caused by:  High blood pressure.  Build up of cholesterol and fat in the arteries.  Heart attack. This injures the heart muscle.  Heart valves that do not open and close properly.  Damage of the heart muscle. This is also called cardiomyopathy.  Lung disease.  Abnormal heart rhythms. What increases the risk? The risk of heart failure goes up as a person ages. This condition is also more likely to develop in people who:  Are overweight.  Are male.  Smoke or chew tobacco.  Abuse alcohol or illegal drugs.  Have taken medicines that can damage the heart.  Have diabetes.  Have abnormal heart rhythms.  Have thyroid problems.  Have low blood counts (anemia). What are the signs or symptoms? Symptoms of this condition include:  Shortness of breath.  Coughing.  Swelling of the feet, ankles, legs, or belly.  Losing weight for no reason.  Trouble breathing.  Waking from sleep because of the need to sit up and get more air.  Rapid heartbeat.  Being very tired.  Feeling dizzy, or feeling like you may pass out (faint).  Having no desire to eat.  Feeling like you may vomit (nauseous).  Peeing (urinating) more at night.  Feeling confused. How is this treated?     This condition may be treated with:  Medicines. These can be given to treat blood pressure and to make the heart muscles stronger.  Changes in your daily life. These may include eating a healthy diet, staying at a healthy body weight, quitting tobacco and illegal drug use, or doing exercises.  Surgery. Surgery can be done to open blocked valves, or to put devices in  the heart, such as pacemakers.  A donor heart (heart transplant). You will receive a healthy heart from a donor. Follow these instructions at home:  Treat other conditions as told by your doctor. These may include high blood pressure, diabetes, thyroid disease, or abnormal heart rhythms.  Learn as much as you can about heart failure.  Get support as you need it.  Keep all follow-up visits as told by your doctor. This is important. Summary  Heart failure means that your heart is not able to pump blood in the right way.  This condition is caused by high blood pressure, heart attack, or damage of the heart muscle.  Symptoms of this condition include shortness of breath and swelling of the feet, ankles, legs, or belly. You may also feel very tired or feel like you may vomit.  You may be treated with medicines, surgery, or changes in your daily life.  Treat other health conditions as told by your doctor. This information is not intended to replace advice given to you by your health care provider. Make sure you discuss any questions you have with your health care provider. Document Released: 05/17/2008 Document Revised: 10/26/2018 Document Reviewed: 10/26/2018 Elsevier Patient Education  2020 [place] were in the hospital because of breathing issues while walking. This may have been a combination of mild heart failure and high blood pressure. I have increased your blood pressure medications. I will not discharge you with  lasix for now and also recommend you hold your losartan for a few days until you get repeat lab work.

## 2019-03-29 NOTE — Discharge Summary (Signed)
Physician Discharge Summary  [name] PTW:[id number] DOB: 1934-09-04 DOA: 03/27/2019  PCP: Dorothyann Peng, NP  Admit date: 03/27/2019 Discharge date: 03/29/2019  Admitted From: Home Disposition: Home  Recommendations for Outpatient Follow-up:  1. Follow up with PCP in 1 week 2. Please obtain BMP/CBC in one week 3. Please follow up on the following pending results: None  Home Health: None Equipment/Devices: None  Discharge Condition: Stable CODE STATUS: Full code Diet recommendation: Heart healthy   Brief/Interim Summary:  Admission HPI written by Ivor Costa, MD   Chief Complaint: SOB  HPI: Austin [name] is a 84 y.o. male with medical history significant of hypertension, hyperlipidemia, diabetes mellitus, CKD stage III, prostate cancer, anemia, carotid artery stenosis, possible CHF, who presents with shortness of breath.  Patient states that he has been having shortness of breath in the past several days, which has worsened today.  Patient does not have chest pain.  He has mild dry cough, but no fever or chills.  Patient states that his blood pressure has not been well controlled.  He is currently taking metoprolol, Cozaar, Lasix, amlodipine and clonidine. He is also on Bidil and Cardura  Patient does not have nausea vomiting, diarrhea, abdominal pain, symptoms of UTI or unilateral weakness.  He had headache earlier, which is resolved currently.  ED Course: pt was found to have WBC 5.7, negative urinalysis, slightly worsening renal function, temperature normal, blood pressure 212/89, no tachycardia, oxygen saturation 92% on room air.  Chest x-ray showed mild CHF.  Patient is placed on telemetry bed for observation.    Hospital course:  Hypertensive urgency Unknown etiology. Improved with increase of clonidine to 0.2 mg TID. Discharge on home metoprolol, amlodipine, Bidil, increased clonidine  Acute diastolic heart failure Transthoracic Echocardiogram significant for normal  EF and diastolic heart failure. Treated with IV lasix with improvement of symptoms. Holding lasix on discharge until follow-up with PCP early next week secondary to AKI  AKI on CKD stage III Secondary to diuresis. Creatinine peaked. Holding lasix and losartan on discharge.  Diabetes mellitus, type 2 Continue glipizide  Hyperlipidemia Continue Lipitor  Carotid artery stenosis Continue Plavix and Lipitor  Discharge Diagnoses:  Principal Problem:   Hypertensive urgency Active Problems:   Diabetes mellitus with renal complications (HCC)   Dyslipidemia   Iron deficiency anemia due to chronic blood loss   Essential hypertension   Carotid artery stenosis   GERD   CKD (chronic kidney disease), stage III [facility])    Discharge Instructions  Discharge Instructions    Call MD for:  difficulty breathing, headache or visual disturbances   Complete by: As directed    Diet - low sodium heart healthy   Complete by: As directed    Increase activity slowly   Complete by: As directed      Allergies as of 03/29/2019      Reactions   Aspirin Other (See Comments)   High doses causes stomach ulcer and bleeding      Medication List    STOP taking these medications   furosemide 20 MG tablet Commonly known as: LASIX   Klor-Con M10 10 MEQ tablet Generic drug: potassium chloride   losartan 100 MG tablet Commonly known as: COZAAR   pramoxine-hydrocortisone 1-1 % rectal cream Commonly known as: PROCTOCREAM-HC     TAKE these medications   amLODipine 10 MG tablet Commonly known as: NORVASC TAKE 1 TABLET BY MOUTH EVERY DAY   atorvastatin 20 MG tablet Commonly known as: LIPITOR TAKE 1  TABLET BY MOUTH EVERY DAY What changed: when to take this   cloNIDine 0.2 MG tablet Commonly known as: CATAPRES Take 1 tablet (0.2 mg total) by mouth 3 (three) times daily. What changed:   medication strength  how much to take  when to take this   clopidogrel 75 MG tablet Commonly known as:  PLAVIX TAKE 1 TABLET EVERY DAY   doxazosin 2 MG tablet Commonly known as: CARDURA Take 2 mg by mouth at bedtime.   esomeprazole 40 MG capsule Commonly known as: NEXIUM TAKE 1 CAPSULE BY MOUTH EVERY DAY What changed:   how much to take  how to take this  when to take this  additional instructions   glipiZIDE 5 MG 24 hr tablet Commonly known as: GLUCOTROL XL TAKE 1 TABLET BY MOUTH EVERY DAY What changed: when to take this   hydrocortisone 2.5 % rectal cream Commonly known as: ANUSOL-HC Place 1 application rectally 2 (two) times daily.   isosorbide-hydrALAZINE 20-37.5 MG tablet Commonly known as: BIDIL Take 1 tablet by mouth 4 (four) times daily.   metoprolol succinate 25 MG 24 hr tablet Commonly known as: TOPROL-XL Take 4 tablets (100 mg total) by mouth daily.   OneTouch Delica Lancets 63J Misc USE TO CHECK BLOOD SUGAR DAILY AND AS NEEDED   OneTouch Verio test strip Generic drug: glucose blood USE TO TEST BLOOD GLUCOSE ONCE DAILY      Follow-up Information    Dorothyann Peng, NP. Schedule an appointment as soon as possible for a visit in 3 day(s).   Specialty: Family Medicine Why: Repeat Labwork Contact information: [place] [address] [phone]          Allergies  Allergen Reactions  . Aspirin Other (See Comments)    High doses causes stomach ulcer and bleeding    Consultations:  None   Procedures/Studies: Dg Chest 2 View  Result Date: 03/27/2019 CLINICAL DATA:  Shortness of breath for several weeks EXAM: CHEST - 2 VIEW COMPARISON:  01/14/2019 FINDINGS: Cardiac shadow is enlarged in size. Aortic calcifications are noted. Increased central vascular congestion is noted without significant interstitial edema. No focal infiltrate or effusion is seen. No bony abnormality is noted. IMPRESSION: Mild CHF without interstitial edema. Electronically Signed   By: Inez Catalina M.D.   On: 03/27/2019 20:55     Transthoracic  Echocardiogram (03/28/2019) IMPRESSIONS    1. The left ventricle has normal systolic function, with an ejection fraction of 55-60%. The cavity size was mildly dilated. There is mildly increased left ventricular wall thickness. Left ventricular diastolic Doppler parameters are consistent with  pseudonormalization. Elevated mean left atrial pressure.  2. The right ventricle has normal systolic function. The cavity was mildly enlarged. Right ventricular systolic pressure is mildly elevated with an estimated pressure of 48.1 mmHg.  3. Left atrial size was severely dilated.  4. Right atrial size was mildly dilated.  5. Trivial pericardial effusion is present.  6. The mitral valve is abnormal. There is mild mitral annular calcification present.  7. The tricuspid valve is grossly normal.  8. The aortic valve is tricuspid. Mild calcification of the aortic valve. Aortic valve regurgitation is mild by color flow Doppler. No stenosis of the aortic valve.  9. The aorta is abnormal in size and structure. 10. There is mild dilatation of the ascending aorta measuring 39 mm. 11. The inferior vena cava was normal in size with <50% respiratory variability. 12. Normal LV systolic function; moderate diastolic dysfunction; mild LVH  and LVE; sclerotic aortic valve with mild AI; biatrial enlargement; mild MR; mild RVE; mild pulmonary hypertension.  FINDINGS  Left Ventricle: The left ventricle has normal systolic function, with an ejection fraction of 55-60%. The cavity size was mildly dilated. There is mildly increased left ventricular wall thickness. Left ventricular diastolic Doppler parameters are  consistent with pseudonormalization. Elevated mean left atrial pressure  Right Ventricle: The right ventricle has normal systolic function. The cavity was mildly enlarged. Right ventricular systolic pressure is mildly elevated with an estimated pressure of 48.1 mmHg.  Left Atrium: Left atrial size was severely  dilated.  Right Atrium: Right atrial size was mildly dilated. Right atrial pressure is estimated at 8 mmHg.  Interatrial Septum: No atrial level shunt detected by color flow Doppler.  Pericardium: Trivial pericardial effusion is present.  Mitral Valve: The mitral valve is abnormal. There is mild mitral annular calcification present. Mitral valve regurgitation is mild by color flow Doppler.  Tricuspid Valve: The tricuspid valve is grossly normal. Tricuspid valve regurgitation is mild by color flow Doppler.  Aortic Valve: The aortic valve is tricuspid Mild calcification of the aortic valve. Aortic valve regurgitation is mild by color flow Doppler. There is No stenosis of the aortic valve.  Pulmonic Valve: The pulmonic valve was grossly normal. Pulmonic valve regurgitation is trivial by color flow Doppler.  Aorta: The aorta is abnormal in size and structure. There is mild dilatation of the ascending aorta measuring 39 mm.  Venous: The inferior vena cava is normal in size with less than 50% respiratory variability.  Additional Comments: Normal LV systolic function; moderate diastolic dysfunction; mild LVH and LVE; sclerotic aortic valve with mild AI; biatrial enlargement; mild MR; mild RVE; mild pulmonary hypertension.   Subjective: No issues today. No dyspnea with ambulation.  Discharge Exam: Vitals:   03/29/19 0437 03/29/19 1518  BP: (!) 156/84 (!) 159/61  Pulse: 73 (!) 59  Resp: 14   Temp: 98.7 F (37.1 C)   SpO2: 97%    Vitals:   03/29/19 0300 03/29/19 0400 03/29/19 0437 03/29/19 1518  BP: (!) 169/95 (!) 166/75 (!) 156/84 (!) 159/61  Pulse:   73 (!) 59  Resp:   14   Temp:   98.7 F (37.1 C)   TempSrc:   Oral   SpO2:   97%   Weight:   73.6 kg   Height:        General: Pt is alert, awake, not in acute distress Cardiovascular: RRR, S1/S2 +, no rubs, no gallops Respiratory: CTA bilaterally, no wheezing, no rhonchi Abdominal: Soft, NT, ND, bowel sounds +  Extremities: 2+ edema, no cyanosis    The results of significant diagnostics from this hospitalization (including imaging, microbiology, ancillary and laboratory) are listed below for reference.     Microbiology: Recent Results (from the past 240 hour(s))  SARS CORONAVIRUS 2 Nasal Swab Aptima Multi Swab     Status: Abnormal   Collection Time: 03/28/19  3:10 AM   Specimen: Aptima Multi Swab; Nasal Swab  Result Value Ref Range Status   SARS Coronavirus 2 (A) NEGATIVE Final    INVALID, UNABLE TO DETERMINE THE PRESENCE OF TARGET DUE TO SPECIMEN INTEGRITY. RECOLLECTION REQUESTED.    Comment: Performed at [facility], [place] [address], [place], [place] [address]  SARS Coronavirus 2 [facility] order, Performed in [facility] hospital lab) Nasopharyngeal Nasopharyngeal Swab     Status: None   Collection Time: 03/28/19  8:45 AM   Specimen: Nasopharyngeal Swab  Result Value Ref Range Status   SARS Coronavirus 2 NEGATIVE NEGATIVE Final    Comment: (NOTE) If result is NEGATIVE SARS-CoV-2 target nucleic acids are NOT DETECTED. The SARS-CoV-2 RNA is generally detectable in upper and lower  respiratory specimens during the acute phase of infection. The lowest  concentration of SARS-CoV-2 viral copies this assay can detect is 250  copies / mL. A negative result does not preclude SARS-CoV-2 infection  and should not be used as the sole basis for treatment or other  patient management decisions.  A negative result may occur with  improper specimen collection / handling, submission of specimen other  than nasopharyngeal swab, presence of viral mutation(s) within the  areas targeted by this assay, and inadequate number of viral copies  (<250 copies / mL). A negative result must be combined with clinical  observations, patient history, and epidemiological information. If result is POSITIVE SARS-CoV-2 target nucleic acids are DETECTED. The SARS-CoV-2 RNA is generally detectable in upper and lower   respiratory specimens dur ing the acute phase of infection.  Positive  results are indicative of active infection with SARS-CoV-2.  Clinical  correlation with patient history and other diagnostic information is  necessary to determine patient infection status.  Positive results do  not rule out bacterial infection or co-infection with other viruses. If result is PRESUMPTIVE POSTIVE SARS-CoV-2 nucleic acids MAY BE PRESENT.   A presumptive positive result was obtained on the submitted specimen  and confirmed on repeat testing.  While 2019 novel coronavirus  (SARS-CoV-2) nucleic acids may be present in the submitted sample  additional confirmatory testing may be necessary for epidemiological  and / or clinical management purposes  to differentiate between  SARS-CoV-2 and other Sarbecovirus currently known to infect humans.  If clinically indicated additional testing with an alternate test  methodology [phone]) is advised. The SARS-CoV-2 RNA is generally  detectable in upper and lower respiratory sp ecimens during the acute  phase of infection. The expected result is Negative. Fact Sheet for Patients:  StrictlyIdeas.no Fact Sheet for Healthcare Providers: BankingDealers.co.za This test is not yet approved or cleared by the [place] FDA and has been authorized for detection and/or diagnosis of SARS-CoV-2 by FDA under an Emergency Use Authorization (EUA).  This EUA will remain in effect (meaning this test can be used) for the duration of the COVID-19 declaration under Section 564(b)(1) of the Act, 21 U.S.C. section 360bbb-3(b)(1), unless the authorization is terminated or revoked sooner. Performed at [facility], [place] [address], [place], [place] [address]      Labs: BNP (last 3 results) Recent Labs    01/14/19 1315 03/28/19 0337  BNP 860.5* 211.9*   Basic Metabolic Panel: Recent Labs  Lab 03/27/19 2030 03/28/19 0337  03/28/19 0540 03/28/19 1926 03/29/19 0407 03/29/19 1151  NA 137  --  140 140 139 139  K 3.6  --  3.3* 3.4* 3.6 3.6  CL 107  --  107 106 106 106  CO2 22  --  20* 24 23 23   GLUCOSE 168*  --  132* 145* 144* 136*  BUN 31*  --  28* 30* 33* 33*  CREATININE 2.94*  --  2.59* 3.30* 3.38* 3.30*  CALCIUM 8.3*  --  8.5* 8.6* 8.3* 8.5*  MG  --  2.3  --   --   --   --    Liver Function Tests: Recent Labs  Lab 03/27/19 2030  AST 19  ALT 10  ALKPHOS 77  BILITOT 0.7  PROT 6.3*  ALBUMIN 3.3*   No results for input(s): LIPASE, AMYLASE in the last 168 hours. No results for input(s): AMMONIA in the last 168 hours. CBC: Recent Labs  Lab 03/27/19 2030 03/28/19 0540 03/29/19 0407  WBC 5.7 12.3* 7.4  NEUTROABS 4.3  --   --   HGB 9.9* 10.8* 9.6*  HCT 30.8* 33.8* 29.1*  MCV 83.0 83.0 80.8  PLT 276 286 275   Cardiac Enzymes: No results for input(s): CKTOTAL, CKMB, CKMBINDEX, TROPONINI in the last 168 hours. BNP: Invalid input(s): POCBNP CBG: Recent Labs  Lab 03/28/19 1333 03/28/19 1627 03/28/19 2219 03/29/19 0843 03/29/19 1144  GLUCAP 92 126* 140* 129* 116*   D-Dimer No results for input(s): DDIMER in the last 72 hours. Hgb A1c Recent Labs    03/28/19 0540  HGBA1C 6.3*   Lipid Profile No results for input(s): CHOL, HDL, LDLCALC, TRIG, CHOLHDL, LDLDIRECT in the last 72 hours. Thyroid function studies No results for input(s): TSH, T4TOTAL, T3FREE, THYROIDAB in the last 72 hours.  Invalid input(s): FREET3 Anemia work up No results for input(s): VITAMINB12, FOLATE, FERRITIN, TIBC, IRON, RETICCTPCT in the last 72 hours. Urinalysis    Component Value Date/Time   COLORURINE YELLOW 03/27/2019 2034   APPEARANCEUR CLEAR 03/27/2019 2034   LABSPEC 1.013 03/27/2019 2034   PHURINE 5.0 03/27/2019 2034   GLUCOSEU 50 (A) 03/27/2019 2034   HGBUR NEGATIVE 03/27/2019 2034   HGBUR negative 09/19/2007 0000   BILIRUBINUR NEGATIVE 03/27/2019 2034   BILIRUBINUR n 11/18/2014 1003    KETONESUR NEGATIVE 03/27/2019 2034   PROTEINUR >=300 (A) 03/27/2019 2034   UROBILINOGEN 0.2 11/18/2014 1003   UROBILINOGEN 1.0 01/26/2013 2059   NITRITE NEGATIVE 03/27/2019 2034   LEUKOCYTESUR NEGATIVE 03/27/2019 2034   Sepsis Labs Invalid input(s): PROCALCITONIN,  WBC,  LACTICIDVEN Microbiology Recent Results (from the past 240 hour(s))  SARS CORONAVIRUS 2 Nasal Swab Aptima Multi Swab     Status: Abnormal   Collection Time: 03/28/19  3:10 AM   Specimen: Aptima Multi Swab; Nasal Swab  Result Value Ref Range Status   SARS Coronavirus 2 (A) NEGATIVE Final    INVALID, UNABLE TO DETERMINE THE PRESENCE OF TARGET DUE TO SPECIMEN INTEGRITY. RECOLLECTION REQUESTED.    Comment: Performed at [facility], [place] [address] [place], [place] [address]  SARS Coronavirus 2 [facility] order, Performed in [facility] hospital lab) Nasopharyngeal Nasopharyngeal Swab     Status: None   Collection Time: 03/28/19  8:45 AM   Specimen: Nasopharyngeal Swab  Result Value Ref Range Status   SARS Coronavirus 2 NEGATIVE NEGATIVE Final    Comment: (NOTE) If result is NEGATIVE SARS-CoV-2 target nucleic acids are NOT DETECTED. The SARS-CoV-2 RNA is generally detectable in upper and lower  respiratory specimens during the acute phase of infection. The lowest  concentration of SARS-CoV-2 viral copies this assay can detect is 250  copies / mL. A negative result does not preclude SARS-CoV-2 infection  and should not be used as the sole basis for treatment or other  patient management decisions.  A negative result may occur with  improper specimen collection / handling, submission of specimen other  than nasopharyngeal swab, presence of viral mutation(s) within the  areas targeted by this assay, and inadequate number of viral copies  (<250 copies / mL). A negative result must be combined with clinical  observations, patient history, and epidemiological information. If result is POSITIVE SARS-CoV-2 target  nucleic acids are DETECTED. The SARS-CoV-2 RNA is  generally detectable in upper and lower  respiratory specimens dur ing the acute phase of infection.  Positive  results are indicative of active infection with SARS-CoV-2.  Clinical  correlation with patient history and other diagnostic information is  necessary to determine patient infection status.  Positive results do  not rule out bacterial infection or co-infection with other viruses. If result is PRESUMPTIVE POSTIVE SARS-CoV-2 nucleic acids MAY BE PRESENT.   A presumptive positive result was obtained on the submitted specimen  and confirmed on repeat testing.  While 2019 novel coronavirus  (SARS-CoV-2) nucleic acids may be present in the submitted sample  additional confirmatory testing may be necessary for epidemiological  and / or clinical management purposes  to differentiate between  SARS-CoV-2 and other Sarbecovirus currently known to infect humans.  If clinically indicated additional testing with an alternate test  methodology [phone]) is advised. The SARS-CoV-2 RNA is generally  detectable in upper and lower respiratory sp ecimens during the acute  phase of infection. The expected result is Negative. Fact Sheet for Patients:  StrictlyIdeas.no Fact Sheet for Healthcare Providers: BankingDealers.co.za This test is not yet approved or cleared by the [place] FDA and has been authorized for detection and/or diagnosis of SARS-CoV-2 by FDA under an Emergency Use Authorization (EUA).  This EUA will remain in effect (meaning this test can be used) for the duration of the COVID-19 declaration under Section 564(b)(1) of the Act, 21 U.S.C. section 360bbb-3(b)(1), unless the authorization is terminated or revoked sooner. Performed at [facility], [place] [address] [place], [place] [address]      Time coordinating discharge: Over 35 units  SIGNED:   Cordelia Poche, MD  [facility] 03/29/2019, 3:38 PM

## 2019-03-29 NOTE — Evaluation (Signed)
Physical Therapy Evaluation Patient Details Name: Austin [name] MRN: [id number] DOB: 20-Apr-1935 Today's Date: 03/29/2019   History of Present Illness   84 y.o. male with medical history significant of hypertension, hyperlipidemia, diabetes mellitus, CKD stage III, prostate cancer, anemia, carotid artery stenosis, possible CHF, who presents with shortness of breath. Cxray reveals mild CHF. Admitted 03/28/19 awaiting TEE  Clinical Impression  PTA pt living with wife in 2 story home with 4 steps to enter, independent in ADLs, iADLs and driving. Pt reports that over the last 3 months he has had more DoE and is having more trouble keeping up with his grandchildren. Pt reports that prior to [place] he walked regularly at [facility]. Pt is independent with transfers and ambulation without AD and mod I for ascent/descent of 12 steps with rail. Pt with 2-3/DoE with ambulation and stair training. PT will continue to follow acutely, however pt will not require PT at d/c. PT does recommend pt for Cardiac Rehab to work on energy conservation and HEP planning.     Follow Up Recommendations No PT follow up;Other (comment)(Cardiac Rehab)    Equipment Recommendations  None recommended by PT    Recommendations for Other Services Other (comment)(Cardiac Rehab)     Precautions / Restrictions Precautions Precautions: None Restrictions Weight Bearing Restrictions: No      Mobility  Bed Mobility               General bed mobility comments: sitting EoB on entry  Transfers Overall transfer level: Independent               General transfer comment: good power up and steadying  Ambulation/Gait Ambulation/Gait assistance: Independent Gait Distance (Feet): 200 Feet Assistive device: None Gait Pattern/deviations: WFL(Within Functional Limits) Gait velocity: WFL Gait velocity interpretation: >2.62 ft/sec, indicative of community ambulatory General Gait Details: stong steady pacem no overt  deviations  Stairs Stairs: Yes Stairs assistance: Modified independent (Device/Increase time) Stair Management: One rail Left;Alternating pattern;Forwards Number of Stairs: 12 General stair comments: strong, steady ascent of steps, 3/4 DoE at top SaO2 on RA 94%O2, strong, steady descent 2/4 DoE        Balance Overall balance assessment: No apparent balance deficits (not formally assessed)                                           Pertinent Vitals/Pain Pain Assessment: No/denies pain    Home Living Family/patient expects to be discharged to:: Private residence Living Arrangements: Spouse/significant other Available Help at Discharge: Family;Available 24 hours/day Type of Home: House Home Access: Stairs to enter Entrance Stairs-Rails: Left Entrance Stairs-Number of Steps: 5 Home Layout: Multi-level;Bed/bath upstairs        Prior Function Level of Independence: Independent         Comments: ambulates community distances without AD, driving      Hand Dominance        Extremity/Trunk Assessment   Upper Extremity Assessment Upper Extremity Assessment: Overall WFL for tasks assessed    Lower Extremity Assessment Lower Extremity Assessment: Overall WFL for tasks assessed       Communication   Communication: No difficulties  Cognition Arousal/Alertness: Awake/alert Behavior During Therapy: WFL for tasks assessed/performed Overall Cognitive Status: Within Functional Limits for tasks assessed  General Comments General comments (skin integrity, edema, etc.): mild edema in bilateral feet         Assessment/Plan    PT Assessment Patient needs continued PT services  PT Problem List Cardiopulmonary status limiting activity       PT Treatment Interventions Gait training;Stair training;Functional mobility training;Therapeutic activities;Therapeutic exercise;Balance training;Cognitive  remediation    PT Goals (Current goals can be found in the Care Plan section)  Acute Rehab PT Goals Patient Stated Goal: have less SoB with ambulating PT Goal Formulation: With patient Time For Goal Achievement: 04/12/19 Potential to Achieve Goals: Good    Frequency Min 3X/week    AM-PAC PT "6 Clicks" Mobility  Outcome Measure Help needed turning from your back to your side while in a flat bed without using bedrails?: None Help needed moving from lying on your back to sitting on the side of a flat bed without using bedrails?: None Help needed moving to and from a bed to a chair (including a wheelchair)?: None Help needed standing up from a chair using your arms (e.g., wheelchair or bedside chair)?: None Help needed to walk in hospital room?: None Help needed climbing 3-5 steps with a railing? : None 6 Click Score: 24    End of Session Equipment Utilized During Treatment: Gait belt Activity Tolerance: Patient tolerated treatment well Patient left: in bed;with family/visitor present;with call bell/phone within reach Nurse Communication: Mobility status PT Visit Diagnosis: Difficulty in walking, not elsewhere classified (R26.2)    Time: 6761-9509 PT Time Calculation (min) (ACUTE ONLY): 21 min   Charges:   PT Evaluation $PT Eval Moderate Complexity: 1 Mod          Austin [name] B. Austin [name] PT, DPT Acute Rehabilitation Services Pager [phone] Office [phone]   Austin [name] 03/29/2019, 1:25 PM

## 2019-04-03 ENCOUNTER — Other Ambulatory Visit: Payer: Self-pay

## 2019-04-03 ENCOUNTER — Emergency Department (HOSPITAL_COMMUNITY): Payer: Medicare Other

## 2019-04-03 ENCOUNTER — Emergency Department (HOSPITAL_COMMUNITY)
Admission: EM | Admit: 2019-04-03 | Discharge: 2019-04-03 | Disposition: A | Discharge status: Home / Self Care | Payer: Medicare Other | Attending: Emergency Medicine | Admitting: Emergency Medicine

## 2019-04-03 ENCOUNTER — Encounter (HOSPITAL_COMMUNITY): Payer: Self-pay

## 2019-04-03 DIAGNOSIS — Z79899 Other long term (current) drug therapy: Secondary | ICD-10-CM | POA: Diagnosis not present

## 2019-04-03 DIAGNOSIS — E1122 Type 2 diabetes mellitus with diabetic chronic kidney disease: Secondary | ICD-10-CM | POA: Diagnosis not present

## 2019-04-03 DIAGNOSIS — Z7901 Long term (current) use of anticoagulants: Secondary | ICD-10-CM | POA: Diagnosis not present

## 2019-04-03 DIAGNOSIS — Z87891 Personal history of nicotine dependence: Secondary | ICD-10-CM | POA: Insufficient documentation

## 2019-04-03 DIAGNOSIS — Z7984 Long term (current) use of oral hypoglycemic drugs: Secondary | ICD-10-CM | POA: Insufficient documentation

## 2019-04-03 DIAGNOSIS — I129 Hypertensive chronic kidney disease with stage 1 through stage 4 chronic kidney disease, or unspecified chronic kidney disease: Secondary | ICD-10-CM | POA: Insufficient documentation

## 2019-04-03 DIAGNOSIS — K5900 Constipation, unspecified: Secondary | ICD-10-CM | POA: Insufficient documentation

## 2019-04-03 DIAGNOSIS — N183 Chronic kidney disease, stage 3 (moderate): Secondary | ICD-10-CM | POA: Diagnosis not present

## 2019-04-03 LAB — URINALYSIS, ROUTINE W REFLEX MICROSCOPIC
Bacteria, UA: NONE SEEN
Bilirubin Urine: NEGATIVE
Glucose, UA: 50 mg/dL — AB
Hgb urine dipstick: NEGATIVE
Ketones, ur: NEGATIVE mg/dL
Leukocytes,Ua: NEGATIVE
Nitrite: NEGATIVE
Protein, ur: >=300 mg/dL — AB
Specific Gravity, Urine: 1.017 (ref 1.005–1.030)
pH: 5 (ref 5.0–8.0)

## 2019-04-03 LAB — COMPREHENSIVE METABOLIC PANEL
ALT: 8 U/L (ref 0–44)
AST: 14 U/L — ABNORMAL LOW (ref 15–41)
Albumin: 3.4 g/dL — ABNORMAL LOW (ref 3.5–5.0)
Alkaline Phosphatase: 71 U/L (ref 38–126)
Anion gap: 10 (ref 5–15)
BUN: 38 mg/dL — ABNORMAL HIGH (ref 8–23)
CO2: 22 mmol/L (ref 22–32)
Calcium: 8.9 mg/dL (ref 8.9–10.3)
Chloride: 105 mmol/L (ref 98–111)
Creatinine, Ser: 3.46 mg/dL — ABNORMAL HIGH (ref 0.61–1.24)
GFR calc Af Amer: 18 mL/min — ABNORMAL LOW (ref >60)
GFR calc non Af Amer: 16 mL/min — ABNORMAL LOW (ref >60)
Glucose, Bld: 133 mg/dL — ABNORMAL HIGH (ref 70–99)
Potassium: 3.5 mmol/L (ref 3.5–5.1)
Sodium: 137 mmol/L (ref 135–145)
Total Bilirubin: 0.9 mg/dL (ref 0.3–1.2)
Total Protein: 6.7 g/dL (ref 6.5–8.1)

## 2019-04-03 LAB — CBC
HCT: 30.6 % — ABNORMAL LOW (ref 39.0–52.0)
Hemoglobin: 9.9 g/dL — ABNORMAL LOW (ref 13.0–17.0)
MCH: 26.7 pg (ref 26.0–34.0)
MCHC: 32.4 g/dL (ref 30.0–36.0)
MCV: 82.5 fL (ref 80.0–100.0)
Platelets: 269 10*3/uL (ref 150–400)
RBC: 3.71 MIL/uL — ABNORMAL LOW (ref 4.22–5.81)
RDW: 13.4 % (ref 11.5–15.5)
WBC: 6.8 10*3/uL (ref 4.0–10.5)
nRBC: 0 % (ref 0.0–0.2)

## 2019-04-03 LAB — LIPASE, BLOOD: Lipase: 28 U/L (ref 11–51)

## 2019-04-03 MED ORDER — MILK AND MOLASSES ENEMA
1.0000 | Freq: Once | RECTAL | Status: AC
  Administered 2019-04-03: 240 mL via RECTAL
  Filled 2019-04-03: qty 240

## 2019-04-03 MED ORDER — SODIUM CHLORIDE 0.9% FLUSH
3.0000 mL | Freq: Once | INTRAVENOUS | Status: AC
  Administered 2019-04-03: 3 mL via INTRAVENOUS

## 2019-04-03 NOTE — ED Provider Notes (Signed)
[facility] EMERGENCY DEPARTMENT Provider Note   CSN: [id number] Arrival date & time: 04/03/19  1236     History   Chief Complaint Chief Complaint  Patient presents with  . Constipation  . Abdominal Pain    HPI Austin [name] is a 84 y.o. male who presents with constipation.  Past medical history significant for chronic kidney disease, diabetes, hypertension, CHF.  Patient states that he has not had a bowel movement in over a week.  He states he was admitted for CHF exacerbation last week and thinks that the diuretics made him become too dry which is contributing to his constipation.  When he eats he reports mild abdominal pain and distention.  The pain is generalized.  He has not had this problem with constipation before.  He tried senna, Dulcolax, MiraLAX without any relief.  He is also had a bleeding hemorrhoid and is on Plavix but denies rectal pain.  He denies fever, nausea, vomiting.  No chest pain or shortness of breath.  Past surgical history significant for appendectomy, prostatectomy. He denies hx of constipation although he has seen [facility] for this problem before    HPI  Past Medical History:  Diagnosis Date  . 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 CAROTID ARTERY STENOSIS 05/10/2010  . CHF (congestive heart failure) ([place])   . DIABETES MELLITUS, TYPE II 09/19/2007  . DISEASE, CEREBROVASCULAR NEC 03/05/2007  . GERD 03/13/2007  . HYPERLIPIDEMIA 03/05/2007  . HYPERTENSION 03/05/2007  . HYPOKALEMIA 11/09/2009  . KNEE PAIN, RIGHT 11/09/2009  . PROSTATE CANCER, HX OF 03/05/2007  . RENAL DISEASE, CHRONIC 02/03/2009    Patient Active Problem List   Diagnosis Date Noted  . Hypertensive urgency 03/28/2019  . CKD (chronic kidney disease), stage III ([place]) 03/28/2019  . 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 Gastrointestinal hemorrhage with melena   . Melena 11/03/2016  . Carotid artery stenosis 05/10/2010  . Diabetes mellitus with  renal complications ([place]) 64/40/3474  . Iron deficiency anemia due to chronic blood loss 03/13/2007  . GERD 03/13/2007  . Dyslipidemia 03/05/2007  . Essential hypertension 03/05/2007  . DISEASE, CEREBROVASCULAR NEC 03/05/2007  . PROSTATE CANCER, HX OF 03/05/2007    Past Surgical History:  Procedure Laterality Date  . CAROTID ARTERY ANGIOPLASTY Right Oct. 10, 2001  . ESOPHAGOGASTRODUODENOSCOPY (EGD) WITH PROPOFOL N/A 11/04/2016   Procedure: ESOPHAGOGASTRODUODENOSCOPY (EGD) WITH PROPOFOL;  Surgeon: Otis Brace, MD;  Location: [facility];  Service: Gastroenterology;  Laterality: N/A;  . LAPAROSCOPIC APPENDECTOMY  02/20/2012   Procedure: APPENDECTOMY LAPAROSCOPIC;  Surgeon: Stark Klein, MD;  Location: [facility];  Service: General;  Laterality: N/A;  . PROSTATE SURGERY     prostatectomy        Home Medications    Prior to Admission medications   Medication Sig Start Date End Date Taking? Authorizing Provider  amLODipine (NORVASC) 10 MG tablet TAKE 1 TABLET BY MOUTH EVERY DAY Patient taking differently: Take 10 mg by mouth daily.  01/25/19   Nafziger, Tommi Rumps, NP  atorvastatin (LIPITOR) 20 MG tablet TAKE 1 TABLET BY MOUTH EVERY DAY Patient taking differently: Take 20 mg by mouth daily at 6 PM.  01/25/19   Nafziger, Tommi Rumps, NP  cloNIDine (CATAPRES) 0.2 MG tablet Take 1 tablet (0.2 mg total) by mouth 3 (three) times daily. 03/29/19   Mariel Aloe, MD  clopidogrel (PLAVIX) 75 MG tablet TAKE 1 TABLET EVERY DAY Patient taking differently: Take 75 mg by mouth daily.  06/21/18  Nafziger, Tommi Rumps, NP  doxazosin (CARDURA) 2 MG tablet Take 2 mg by mouth at bedtime.  12/28/17   [provider]  esomeprazole (NEXIUM) 40 MG capsule TAKE 1 CAPSULE BY MOUTH EVERY DAY Patient taking differently: Take 40 mg by mouth daily at 12 noon.  05/03/18   Marletta Lor, MD  glipiZIDE (GLUCOTROL XL) 5 MG 24 hr tablet TAKE 1 TABLET BY MOUTH EVERY DAY Patient taking differently: Take 5 mg by mouth daily with  breakfast.  03/12/19   Nafziger, Tommi Rumps, NP  glucose blood (ONETOUCH VERIO) test strip USE TO TEST BLOOD GLUCOSE ONCE DAILY 03/08/19   Nafziger, Tommi Rumps, NP  hydrocortisone (ANUSOL-HC) 2.5 % rectal cream Place 1 application rectally 2 (two) times daily. 11/13/18   Nafziger, Tommi Rumps, NP  isosorbide-hydrALAZINE (BIDIL) 20-37.5 MG tablet Take 1 tablet by mouth 4 (four) times daily.     [provider]  metoprolol succinate (TOPROL-XL) 25 MG 24 hr tablet Take 4 tablets (100 mg total) by mouth daily. 03/29/19   Mariel Aloe, MD  [facility] DELICA LANCETS 01U MISC USE TO CHECK BLOOD SUGAR DAILY AND AS NEEDED 12/18/17   Marletta Lor, MD    Family History Family History  Problem Relation Age of Onset  . Hypertension Mother   . Cancer Father        Mesothelioma   . Stomach cancer Brother   . Cancer Brother   . Cancer - Cervical Brother   . Cancer Brother   . Diabetes Brother   . Esophageal cancer Neg Hx     Social History Social History   Tobacco Use  . Smoking status: Former Smoker    Types: Cigarettes    Quit date: 08/22/1974    Years since quitting: 44.6  . Smokeless tobacco: Never Used  Substance Use Topics  . Alcohol use: No    Alcohol/week: 0.0 standard drinks  . Drug use: No     Allergies   Aspirin   Review of Systems Review of Systems  Constitutional: Negative for fever.  Respiratory: Negative for shortness of breath.   Cardiovascular: Negative for chest pain.  Gastrointestinal: Positive for abdominal distention, abdominal pain and constipation. Negative for blood in stool, diarrhea, nausea, rectal pain and vomiting.       +bleeding hemorrhoid  Genitourinary: Positive for hematuria (chronic). Negative for difficulty urinating, dysuria and flank pain.  Neurological: Negative for headaches.  All other systems reviewed and are negative.    Physical Exam Updated Vital Signs BP (!) 180/62   Pulse 64   Temp 98.4 F (36.9 C) (Oral)   Resp 12   SpO2 98%    Physical Exam Vitals signs and nursing note reviewed.  Constitutional:      General: He is not in acute distress.    Appearance: He is well-developed. He is obese. He is not ill-appearing.  HENT:     Head: Normocephalic and atraumatic.  Eyes:     General: No scleral icterus.       Right eye: No discharge.        Left eye: No discharge.     Conjunctiva/sclera: Conjunctivae normal.     Pupils: Pupils are equal, round, and reactive to light.  Neck:     Musculoskeletal: Normal range of motion.  Cardiovascular:     Rate and Rhythm: Normal rate and regular rhythm.  Pulmonary:     Effort: Pulmonary effort is normal. No respiratory distress.     Breath sounds: Normal breath sounds.  Abdominal:     General: There is no distension.     Palpations: Abdomen is soft.     Tenderness: There is no abdominal tenderness.  Genitourinary:    Comments: Rectal: Nonthrombosed, nonbleeding hemorrhoid.  No fecal impaction Skin:    General: Skin is warm and dry.  Neurological:     Mental Status: He is alert and oriented to person, place, and time.  Psychiatric:        Behavior: Behavior normal.      ED Treatments / Results  Labs (all labs ordered are listed, but only abnormal results are displayed) Labs Reviewed  COMPREHENSIVE METABOLIC PANEL - Abnormal; Notable for the following components:      Result Value   Glucose, Bld 133 (*)    BUN 38 (*)    Creatinine, Ser 3.46 (*)    Albumin 3.4 (*)    AST 14 (*)    GFR calc non Af Amer 16 (*)    GFR calc Af Amer 18 (*)    All other components within normal limits  CBC - Abnormal; Notable for the following components:   RBC 3.71 (*)    Hemoglobin 9.9 (*)    HCT 30.6 (*)    All other components within normal limits  URINALYSIS, ROUTINE W REFLEX MICROSCOPIC - Abnormal; Notable for the following components:   Glucose, UA 50 (*)    Protein, ur >=300 (*)    All other components within normal limits  LIPASE, BLOOD    EKG None  Radiology No  results found.  Procedures Procedures (including critical care time)  Medications Ordered in ED Medications  sodium chloride flush (NS) 0.9 % injection 3 mL (3 mLs Intravenous Given 04/03/19 1639)  milk and molasses enema (240 mLs Rectal Given 04/03/19 2111)     Initial Impression / Assessment and Plan / ED Course  I have reviewed the triage vital signs and the nursing notes.  Pertinent labs & imaging results that were available during my care of the patient were reviewed by me and considered in my medical decision making (see chart for details).  84 year old male presents with constipation for 1 week.  He is hypertensive but otherwise vital signs are normal.  Abdomen is soft and minimally tender.  Rectal exam is negative for fecal impaction.  CBC is remarkable for anemia which is around his baseline.  CMP is remarkable for chronic kidney disease which is unchanged.  Will obtain CT of abdomen and pelvis to rule out bowel obstruction  CT is negative for any acute process.  He was given an enema and 2 large bowel movements in the ED.  He is encouraged to take his MiraLAX and to follow-up with his PCP.  Final Clinical Impressions(s) / ED Diagnoses   Final diagnoses:  Constipation, unspecified constipation type    ED Discharge Orders    None       Recardo Evangelist, PA-C 04/03/19 2354    Margette Fast, MD 04/04/19 1233

## 2019-04-03 NOTE — ED Triage Notes (Signed)
Pt reports constipation and abd pain.Last BM was last Tuesday (1 week ago)the patient recently admitted for CHF exacerbation. Pt also reports mild abd pain.

## 2019-04-03 NOTE — ED Notes (Signed)
Discharge instructions discussed with pt. Pt verbalized understanding and will follow up with PCP. Pt to go home with family. No questions at this time

## 2019-04-03 NOTE — ED Notes (Signed)
Pt has had to large bowel movement. Pt states he is ready to go home. Pt denies feeling constipated/abd tenderness.  Dr. Laverta Baltimore informed

## 2019-04-03 NOTE — ED Notes (Signed)
Reviewed with patient the instructions for PO contrast.   Denies any nausea and is able to drink without difficulty.

## 2019-04-03 NOTE — Discharge Instructions (Addendum)
Please continue taking Miralax for constipation Follow up with your doctor

## 2019-04-09 ENCOUNTER — Other Ambulatory Visit: Payer: Self-pay

## 2019-04-09 ENCOUNTER — Ambulatory Visit (INDEPENDENT_AMBULATORY_CARE_PROVIDER_SITE_OTHER): Payer: Medicare Other | Admitting: Adult Health

## 2019-04-09 DIAGNOSIS — R0602 Shortness of breath: Secondary | ICD-10-CM

## 2019-04-09 DIAGNOSIS — K5900 Constipation, unspecified: Secondary | ICD-10-CM

## 2019-04-09 DIAGNOSIS — N184 Chronic kidney disease, stage 4 (severe): Secondary | ICD-10-CM

## 2019-04-09 DIAGNOSIS — R600 Localized edema: Secondary | ICD-10-CM | POA: Diagnosis not present

## 2019-04-09 MED ORDER — MAGNESIUM CITRATE PO SOLN: 1.0000 | Freq: Once | ORAL | 0 refills | Status: AC

## 2019-04-09 NOTE — Progress Notes (Signed)
Virtual Visit via Telephone Note  I connected with Austin [name] on 04/09/19 at  4:30 PM EDT by telephone and verified that I am speaking with the correct person using two identifiers.   I discussed the limitations, risks, security and privacy concerns of performing an evaluation and management service by telephone and the availability of in person appointments. I also discussed with the patient that there may be a patient responsible charge related to this service. The patient expressed understanding and agreed to proceed.  Location patient: home Location provider: work or home office Participants present for the call: patient, provider Patient did not have a visit in the prior 7 days to address this/these issue(s).   History of Present Illness: 84 year old male who  has a past medical history of ANEMIA DUE TO CHRONIC BLOOD LOSS (03/13/2007), CAROTID ARTERY STENOSIS (05/10/2010), CHF (congestive heart failure) ([place]), DIABETES MELLITUS, TYPE II (09/19/2007), DISEASE, CEREBROVASCULAR NEC (03/05/2007), GERD (03/13/2007), HYPERLIPIDEMIA (03/05/2007), HYPERTENSION (03/05/2007), HYPOKALEMIA (11/09/2009), KNEE PAIN, RIGHT (11/09/2009), PROSTATE CANCER, HX OF (03/05/2007), and RENAL DISEASE, CHRONIC (02/03/2009).  He presents to the clinic today for TCM visit   Over the last two weeks he has had a hospital admission as well as an ER visit.   Hospital Admission  Admit Date - 03/27/2019 Discharge Date 03/29/2019  Presented to the emergency room with a complaint of shortness of breath for several days prior to being evaluated.  On the day he presented to the emergency room his shortness of breath worsened.  He denied chest pain.  He had a mild dry cough but no fevers or chills.  In the ER he was found to have a WBC of 5.7, negative urinalysis, slightly worsening renal function, blood pressure 212/89, no tachycardia, oxygen saturation was 92% on room air.  His chest x-ray showed mild CHF he was admitted on a telemetry  bed for observation  Hospital Course  1. Hypertensive Emergency -etiology was unknown.  Improved with increasing clonidine to 0.2 mg 3 times daily.  He was discharged on home metoprolol, amlodipine, Bidil, and increase clonidine  2. Acute diastolic heart failure -Transthoracic echocardiogram significant for normal EF and diastolic heart failure.  He was treated with IV Lasix with improvement in his symptoms.  Holding Lasix on discharge until follow-up with PCP secondary to AKI  3. AKI on CKD stage III  -Secondary to diuresis.  Creatinine peaked.  Holding Lasix and losartan on discharge  4. DM, type 2 -Continue glipizide  5. Hyperlipidemia  - Continue lipitor   6. Carotid Artery stenosis  - Continue Plavix and Lipitor  ----------------------------------------------------------------------------------------------------  He was then seen on 04/03/2019 in the emergency room for constipation.  At this time he reported that he had not had a bowel movement in over a week.  He had not had a constipation issue prior to this incident.  He had tried senna, Dulcolax, and MiraLAX without any relief.  He denied rectal pain, fevers, nausea, or vomiting  Cup in the ER showed a CBC remarkable for anemia but was around baseline.  His CMP was remarkable for chronic kidney disease which was unchanged.  CT of abdomen and pelvis was negative for bowel obstruction.  He was given an enema and had 2 large bowel movements in the emergency room.  He was encouraged to take MiraLAX and follow-up with PCP  Today he reports that overall he is feeling improved, he is not having any shortness of breath or chest pain.  He has followed up  with nephrology since being discharged.  His biggest complaint is that of constipation.  Since being seen in the emergency room 6 days ago he has not had a bowel movement.  He has been using MiraLAX and senna as directed.  Feels as though his constipation is due to fluid restrictions as  well as not eating enough fiber throughout the day.  In the past he used to eat a lot of salads but has recently gotten away from that habit.    Observations/Objective: Patient sounds cheerful and well on the phone. I do not appreciate any SOB. Speech and thought processing are grossly intact. Patient reported vitals:  Assessment and Plan: 1. SOB (shortness of breath) -We reviewed his hospital discharge note, imaging, labs, and other notes.  All questions answered to the best of my ability.  Continue on regimen placed by nephrology  2. Constipation, unspecified constipation type -Encouraged to increase dietary fiber through green leafy vegetables.  Can also use Benefiber in the morning.  Will send in bottle of magnesium citrate tonight for acute constipation - magnesium citrate SOLN; Take 296 mLs (1 Bottle total) by mouth once for 1 dose.  Dispense: 195 mL; Refill: 0  3. Lower extremity edema -Continue with nephrology plan of care.  Reports legs are less swollen today.  4. CKD (chronic kidney disease) stage 4, GFR 15-29 ml/min (HCC) -Continue with nephrology plan of care   Follow Up Instructions:   I did not refer this patient for an OV in the next 24 hours for this/these issue(s).  I discussed the assessment and treatment plan with the patient. The patient was provided an opportunity to ask questions and all were answered. The patient agreed with the plan and demonstrated an understanding of the instructions.   The patient was advised to call back or seek an in-person evaluation if the symptoms worsen or if the condition fails to improve as anticipated.  I provided 30 minutes of non-face-to-face time during this encounter.   Dorothyann Peng, NP

## 2019-04-10 ENCOUNTER — Encounter: Payer: Self-pay | Admitting: Adult Health

## 2019-04-15 ENCOUNTER — Other Ambulatory Visit: Payer: Self-pay | Admitting: Adult Health

## 2019-04-15 ENCOUNTER — Telehealth: Payer: Self-pay

## 2019-04-15 NOTE — Telephone Encounter (Signed)
Patient is calling back to get advise from [facility] regarding the constipation for 3 weeks. And Tommi Rumps recommended some medication and the patient has used the restroom 1 time since last Tuesday.  Please advise CB- [phone]

## 2019-04-15 NOTE — Telephone Encounter (Signed)
Copied from [place] [phone]. Topic: General - Other >> Apr 15, 2019 10:02 AM Yvette Rack wrote: Reason for CRM: Pt stated he is still experiencing constipation for the 3rd week and he would like [facility] to contact him to discuss being referred to a GI doctor. Pt requests call back.

## 2019-04-16 ENCOUNTER — Ambulatory Visit: Payer: Self-pay | Admitting: *Deleted

## 2019-04-16 NOTE — Telephone Encounter (Signed)
Has he been using Benefiber, increasing fluids and eating more green leafy vegetables?

## 2019-04-16 NOTE — Telephone Encounter (Signed)
Left a message for a return call.

## 2019-04-16 NOTE — Telephone Encounter (Signed)
Sent to the pharmacy by e-scribe for 90 days.  Pt has upcoming appt for cpx.

## 2019-04-16 NOTE — Telephone Encounter (Addendum)
Summary: Constipation    Pt is constipated, missed call from [place]. Seeking advice      Returned call to patient regarding his constipation. He was given the message from [facility] regarding using the Bene fiber, increasing his fluids and eating leafy green vegetables. He stated he had been using the Bene fiber, miralax, and stool softener. He has not been able to get a lot of fluids in because of his diagnosis with CHF. He has an external hemorrhoid that is not bothering him this time. Denies any bleeding, abd pain, fever or nausea/vomiting. Advised that if he starts having those symptoms,he should go to the ED. He voiced understanding. He thinks the constipation started with an antibiotic that he was prescribed. He has an appointment scheduled with [facility] in the morning. He would like a call back regarding this appointment and has a question about magnesium citrate for his bowels. Routing to LB at [place] for review.  Reason for Disposition . [1] Constipation persists > 1 week AND [2] no improvement after using CARE ADVICE  Answer Assessment - Initial Assessment Questions 1. STOOL PATTERN OR FREQUENCY: "How often do you pass bowel movements (BMs)?"  (Normal range: tid to q 3 days)  "When was the last BM passed?"       Used to be everyday 2. STRAINING: "Do you have to strain to have a BM?"      Sometime with taking an antibiotic 3. RECTAL PAIN: "Does your rectum hurt when the stool comes out?" If so, ask: "Do you have hemorrhoids? How bad is the pain?"  (Scale 1-10; or mild, moderate, severe)     One external hemorrhoids, have given him problems before 4. STOOL COMPOSITION: "Are the stools hard?"      Last stool was kinda of loose but it was from taking the mag citrate 5. BLOOD ON STOOLS: "Has there been any blood on the toilet tissue or on the surface of the BM?" If so, ask: "When was the last time?"      no 6. CHRONIC CONSTIPATION: "Is this a new problem for you?"  If no, ask:  "How long have you had this problem?" (days, weeks, months)      Started in the last 6 months 7. CHANGES IN DIET: "Have there been any recent changes in your diet?"      Not eating as much of the leafy green vegetables and not 64 ounces of water because of being diagnosed with CHF 8. MEDICATIONS: "Have you been taking any new medications?"     no 9. LAXATIVES: "Have you been using any laxatives or enemas?"  If yes, ask "What, how often, and when was the last time?"     Laxatives, stool softern and benefiber 10. CAUSE: "What do you think is causing the constipation?"        Thinks it is from taking antibiotics. 11. OTHER SYMPTOMS: "Do you have any other symptoms?" (e.g., abdominal pain, fever, vomiting)       Passing a lot of gas 12. PREGNANCY: "Is there any chance you are pregnant?" "When was your last menstrual period?"       n/a  Protocols used: CONSTIPATION-A-AH

## 2019-04-17 ENCOUNTER — Other Ambulatory Visit: Payer: Self-pay

## 2019-04-17 ENCOUNTER — Ambulatory Visit: Payer: Medicare Other

## 2019-04-17 NOTE — Telephone Encounter (Signed)
If he continues to have constipation after all of that then lets refer to GI. Have him increase green leafy vegetables.   What question does he have about Magnesium Citrate?

## 2019-04-17 NOTE — Telephone Encounter (Signed)
Spoke to the pt and advised that he could try mag citrate and a fleet enema.  Emergency room if that does not work.  Also advised that he has seen Dr. Ardis Hughs late 2019 at gastro.  Could also give that office a call if needed.  Nothing further needed at this time.

## 2019-04-17 NOTE — Telephone Encounter (Signed)
See other message

## 2019-04-18 ENCOUNTER — Other Ambulatory Visit: Payer: Self-pay | Admitting: Family Medicine

## 2019-04-18 MED ORDER — ONETOUCH DELICA LANCETS 33G MISC: 3 refills | Status: DC

## 2019-04-18 NOTE — Telephone Encounter (Signed)
Sent to the pharmacy by e-scribe. 

## 2019-04-22 NOTE — Telephone Encounter (Addendum)
Pt states he is still having constipation issues.  Pt states he is still waiting for GI referral, and would like to speak with [name]/Assistant about options in the meantime to avoid going 5-6 days w/o BM.

## 2019-04-23 ENCOUNTER — Other Ambulatory Visit: Payer: Self-pay

## 2019-04-23 ENCOUNTER — Telehealth (INDEPENDENT_AMBULATORY_CARE_PROVIDER_SITE_OTHER): Payer: Medicare Other | Admitting: Adult Health

## 2019-04-23 DIAGNOSIS — K5900 Constipation, unspecified: Secondary | ICD-10-CM | POA: Diagnosis not present

## 2019-04-23 MED ORDER — LACTULOSE 10 GM/15ML PO SOLN: 10.0000 g | Freq: Two times a day (BID) | ORAL | 1 refills | Status: DC | PRN

## 2019-04-23 NOTE — Progress Notes (Signed)
Virtual Visit via Telephone Note  I connected with Austin [name] on 04/23/19 at  3:30 PM EDT by telephone and verified that I am speaking with the correct person using two identifiers.   I discussed the limitations, risks, security and privacy concerns of performing an evaluation and management service by telephone and the availability of in person appointments. I also discussed with the patient that there may be a patient responsible charge related to this service. The patient expressed understanding and agreed to proceed.  Location patient: home Location provider: work or home office Participants present for the call: patient, provider Patient did not have a visit in the prior 7 days to address this/these issue(s).   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  He is being evaluated today for ongoing issues with constipation.  He has had this issue for the last month or so.  Less frequently he reports that he has not had a bowel movement in the last week.  He is passing gas and does not have abdominal pain nor does he have nausea vomiting.  He has been using fiber supplements and stool softeners without resolution.  Magnesium citrate has worked in the past and he is to try this again.  He has not followed up with GI about this issue yet.      Observations/Objective: Patient sounds cheerful and well on the phone. I do not appreciate any SOB. Speech and thought processing are grossly intact. Patient reported vitals:  Assessment and Plan: 1. Constipation,  unspecified constipation type - Will trial him on Lactulose. Advised follow up if no improvement by the end of the week  - Follow up with GI  - lactulose (CHRONULAC) 10 GM/15ML solution; Take 15 mLs (10 g total) by mouth 2 (two) times daily as needed for mild constipation.  Dispense: 473 mL; Refill: 1   Follow Up Instructions:  I did not refer this patient for an OV in the next 24 hours for this/these issue(s).  I discussed the assessment and treatment plan with the patient. The patient was provided an opportunity to ask questions and all were answered. The patient agreed with the plan and demonstrated an understanding of the instructions.   The patient was advised to call back or seek an in-person evaluation if the symptoms worsen or if the condition fails to improve as anticipated.  I provided 15 minutes of non-face-to-face time during this encounter.   Dorothyann Peng, NP

## 2019-04-23 NOTE — Telephone Encounter (Signed)
Left a message for pt to call back and schedule virtual/telephone call with [facility].

## 2019-04-23 NOTE — Telephone Encounter (Signed)
CRM created. 

## 2019-04-23 NOTE — Telephone Encounter (Signed)
Pt is scheduled to see Tommi Rumps in virtual visit.

## 2019-04-26 ENCOUNTER — Ambulatory Visit (INDEPENDENT_AMBULATORY_CARE_PROVIDER_SITE_OTHER): Payer: Medicare Other | Admitting: Nurse Practitioner

## 2019-04-26 ENCOUNTER — Other Ambulatory Visit: Payer: Self-pay

## 2019-04-26 ENCOUNTER — Encounter: Payer: Self-pay | Admitting: Nurse Practitioner

## 2019-04-26 VITALS — BP 122/52 | HR 53 | Temp 97.4°F | Ht 67.0 in | Wt 184.0 lb

## 2019-04-26 DIAGNOSIS — K219 Gastro-esophageal reflux disease without esophagitis: Secondary | ICD-10-CM | POA: Diagnosis not present

## 2019-04-26 DIAGNOSIS — K5900 Constipation, unspecified: Secondary | ICD-10-CM | POA: Diagnosis not present

## 2019-04-26 NOTE — Progress Notes (Signed)
Chief Complaint: Constipation  IMPRESSION and PLAN:    84 year old male with chronic constipation.  Taking Citrucel but only once a week.  Drinking limited amounts of fluid, apparently on a fluid restriction due to heart failure.  -Advised patient to start taking the Citrucel every day -Recommended he call Dr. Zenia Resides office to see find out his daily fluid allowance, he has no idea and therefor not drinking much in the way of fluids. Preferably from a GI standpoint we would like him to have at least 48 ounces of water a day but this may not be feasible from a cardiac standpoint.  -PCP just prescribed lactulose, patient has a prescription but has not started it.  He will try the lactulose twice a day.  It should have minimal effect on his blood sugar but cautioned him about potential for bloating/gas. -He will call us if constipation does not improve with above measures. The minor rectal bleeding for which we saw him for in December has resolved.    HPI:     Patient is an 84 year old male with PMH significant for hypertension, hyperlipidemia, carotid artery stenosis , DM, CKD 3, and prostate cancer, known to Dr. Ardis Hughs.  He was last seen December 2019 evaluation of rectal bleeding on Plavix.  Minor rectal bleeding was associated with constipation.  We recommended he start daily Citrucel and follow-up in a couple of months.  Austin [name] is back with complaints of constipation.  He believes it all started with the antibiotics prescribed for cyst in his neck.  Since his last visit here he has had removal of the cyst but before that had taken a few more rounds of antibiotics.  He describes his stool as "rocks".  The minor rectal bleeding resolved.  Patient was recently hospitalized with congestive heart failure and is on a fluid restriction but he does not have any idea about what his fluid limits are.  He certainly is not drinking much water.  He has been taking the Citrucel only once a week,  apparently did not realize it was recommended to be taken on a daily basis.  PCP just prescribed lactulose twice daily, he has the prescription but not yet started  Patient has no other GI complaints.  He has a history of GERD, heartburn controlled on daily PPI  Data Reviewed:  04/03/2019 Hemoglobin 9.9 (basline ~ 11). , MCV 82.5, platelets 269 Creatinine 3.46 Liver test normal Lipase normal  Review of systems:     No chest pain, no SOB, no fevers, no urinary sx   Past Medical History:  Diagnosis Date   ANEMIA DUE TO CHRONIC BLOOD LOSS 03/13/2007   CAROTID ARTERY STENOSIS 05/10/2010   CHF (congestive heart failure) ([place])    DIABETES MELLITUS, TYPE II 09/19/2007   DISEASE, CEREBROVASCULAR NEC 03/05/2007   GERD 03/13/2007   HYPERLIPIDEMIA 03/05/2007   HYPERTENSION 03/05/2007   HYPOKALEMIA 11/09/2009   KNEE PAIN, RIGHT 11/09/2009   PROSTATE CANCER, HX OF 03/05/2007   RENAL DISEASE, CHRONIC 02/03/2009    Patient's surgical history, family medical history, social history, medications and allergies were all reviewed in Epic   Current Outpatient Medications  Medication Sig Dispense Refill   amLODipine (NORVASC) 10 MG tablet TAKE 1 TABLET BY MOUTH EVERY DAY 90 tablet 0   atorvastatin (LIPITOR) 20 MG tablet TAKE 1 TABLET BY MOUTH EVERY DAY 90 tablet 0   cloNIDine (CATAPRES) 0.2 MG tablet Take 1 tablet (0.2 mg total) by mouth 3 (three)  times daily. 90 tablet 0   clopidogrel (PLAVIX) 75 MG tablet TAKE 1 TABLET EVERY DAY (Patient taking differently: Take 75 mg by mouth daily. ) 90 tablet 3   doxazosin (CARDURA) 2 MG tablet Take 2 mg by mouth at bedtime.   3   esomeprazole (NEXIUM) 40 MG capsule TAKE 1 CAPSULE BY MOUTH EVERY DAY (Patient taking differently: Take 40 mg by mouth daily at 12 noon. ) 90 capsule 3   glipiZIDE (GLUCOTROL XL) 5 MG 24 hr tablet TAKE 1 TABLET BY MOUTH EVERY DAY (Patient taking differently: Take 5 mg by mouth daily with breakfast. ) 90 tablet 0   glucose  blood (ONETOUCH VERIO) test strip USE TO TEST BLOOD GLUCOSE ONCE DAILY 100 each 3   hydrocortisone (ANUSOL-HC) 2.5 % rectal cream Place 1 application rectally 2 (two) times daily. 30 g 3   isosorbide-hydrALAZINE (BIDIL) 20-37.5 MG tablet Take 1 tablet by mouth 4 (four) times daily.      lactulose (CHRONULAC) 10 GM/15ML solution Take 15 mLs (10 g total) by mouth 2 (two) times daily as needed for mild constipation. 473 mL 1   metoprolol succinate (TOPROL-XL) 25 MG 24 hr tablet Take 4 tablets (100 mg total) by mouth daily.     OneTouch Delica Lancets 76B MISC USE TO CHECK BLOOD SUGAR DAILY. 100 each 3   No current facility-administered medications for this visit.     Physical Exam:     BP (!) 122/52    Pulse (!) 53    Temp (!) 97.4 F (36.3 C)    Ht 5\' 7"  (1.702 m)    Wt 184 lb (83.5 kg)    BMI 28.82 kg/m   GENERAL:  Pleasant male in NAD PSYCH: : Cooperative, normal affect EENT:  conjunctiva pink, mucous membranes moist, neck supple without masses CARDIAC:  RRR, +murmur heard, no peripheral edema PULM: Normal respiratory effort, lungs CTA bilaterally, no wheezing ABDOMEN:  Nondistended, soft, nontender. No obvious masses, no hepatomegaly,  normal bowel sounds SKIN:  turgor, no lesions seen Musculoskeletal:  Normal muscle tone, normal strength NEURO: Alert and oriented x 3, no focal neurologic deficits   Tye Savoy , NP 04/26/2019, 9:16 AM

## 2019-04-26 NOTE — Patient Instructions (Signed)
If you are age 82 or older, your body mass index should be between 23-30. Your Body mass index is 28.82 kg/m. If this is out of the aforementioned range listed, please consider follow up with your Primary Care Provider.  If you are age 3 or younger, your body mass index should be between 19-25. Your Body mass index is 28.82 kg/m. If this is out of the aformentioned range listed, please consider follow up with your Primary Care Provider.    Increase Citrucel to everyday.  Go ahead and start Lactulose twice daily.  Ask Dr. Terrence Dupont if okay to drink 6 glasses of water a day.  Call if constipation doesn't improve.  Thank you for choosing me and [facility].   Tye Savoy, NP

## 2019-04-26 NOTE — Progress Notes (Signed)
I agree with the above note, plan 

## 2019-04-27 ENCOUNTER — Other Ambulatory Visit: Payer: Self-pay | Admitting: Adult Health

## 2019-04-30 NOTE — Telephone Encounter (Signed)
CPX scheduled for 05/01/2019

## 2019-05-01 ENCOUNTER — Ambulatory Visit (INDEPENDENT_AMBULATORY_CARE_PROVIDER_SITE_OTHER): Payer: Medicare Other | Admitting: Adult Health

## 2019-05-01 ENCOUNTER — Encounter: Payer: Self-pay | Admitting: Adult Health

## 2019-05-01 ENCOUNTER — Other Ambulatory Visit: Payer: Self-pay | Admitting: Adult Health

## 2019-05-01 ENCOUNTER — Other Ambulatory Visit: Payer: Self-pay

## 2019-05-01 VITALS — BP 130/54 | Temp 97.8°F | Ht 67.75 in | Wt 185.0 lb

## 2019-05-01 DIAGNOSIS — Z23 Encounter for immunization: Secondary | ICD-10-CM | POA: Diagnosis not present

## 2019-05-01 DIAGNOSIS — I6529 Occlusion and stenosis of unspecified carotid artery: Secondary | ICD-10-CM | POA: Diagnosis not present

## 2019-05-01 DIAGNOSIS — E0821 Diabetes mellitus due to underlying condition with diabetic nephropathy: Secondary | ICD-10-CM

## 2019-05-01 DIAGNOSIS — Z0000 Encounter for general adult medical examination without abnormal findings: Secondary | ICD-10-CM

## 2019-05-01 DIAGNOSIS — I10 Essential (primary) hypertension: Secondary | ICD-10-CM

## 2019-05-01 DIAGNOSIS — K5900 Constipation, unspecified: Secondary | ICD-10-CM

## 2019-05-01 DIAGNOSIS — N184 Chronic kidney disease, stage 4 (severe): Secondary | ICD-10-CM | POA: Diagnosis not present

## 2019-05-01 DIAGNOSIS — E785 Hyperlipidemia, unspecified: Secondary | ICD-10-CM

## 2019-05-01 DIAGNOSIS — K5901 Slow transit constipation: Secondary | ICD-10-CM

## 2019-05-01 DIAGNOSIS — Z8546 Personal history of malignant neoplasm of prostate: Secondary | ICD-10-CM

## 2019-05-01 LAB — LIPID PANEL
Cholesterol: 124 mg/dL (ref 0–200)
HDL: 42.7 mg/dL (ref >39.00)
LDL Cholesterol: 63 mg/dL (ref 0–99)
NonHDL: 80.88
Total CHOL/HDL Ratio: 3
Triglycerides: 90 mg/dL (ref 0.0–149.0)
VLDL: 18 mg/dL (ref 0.0–40.0)

## 2019-05-01 LAB — CBC WITH DIFFERENTIAL/PLATELET
Basophils Absolute: 0 10*3/uL (ref 0.0–0.1)
Basophils Relative: 0.4 % (ref 0.0–3.0)
Eosinophils Absolute: 0.1 10*3/uL (ref 0.0–0.7)
Eosinophils Relative: 1 % (ref 0.0–5.0)
HCT: 31.7 % — ABNORMAL LOW (ref 39.0–52.0)
Hemoglobin: 10.5 g/dL — ABNORMAL LOW (ref 13.0–17.0)
Lymphocytes Relative: 10.2 % — ABNORMAL LOW (ref 12.0–46.0)
Lymphs Abs: 0.7 10*3/uL (ref 0.7–4.0)
MCHC: 33.1 g/dL (ref 30.0–36.0)
MCV: 81.4 fl (ref 78.0–100.0)
Monocytes Absolute: 0.5 10*3/uL (ref 0.1–1.0)
Monocytes Relative: 6.7 % (ref 3.0–12.0)
Neutro Abs: 5.9 10*3/uL (ref 1.4–7.7)
Neutrophils Relative %: 81.7 % — ABNORMAL HIGH (ref 43.0–77.0)
Platelets: 234 10*3/uL (ref 150.0–400.0)
RBC: 3.9 Mil/uL — ABNORMAL LOW (ref 4.22–5.81)
RDW: 13.8 % (ref 11.5–15.5)
WBC: 7.3 10*3/uL (ref 4.0–10.5)

## 2019-05-01 LAB — COMPREHENSIVE METABOLIC PANEL
ALT: 6 U/L (ref 0–53)
AST: 9 U/L (ref 0–37)
Albumin: 3.7 g/dL (ref 3.5–5.2)
Alkaline Phosphatase: 73 U/L (ref 39–117)
BUN: 35 mg/dL — ABNORMAL HIGH (ref 6–23)
CO2: 30 mEq/L (ref 19–32)
Calcium: 9.2 mg/dL (ref 8.4–10.5)
Chloride: 100 mEq/L (ref 96–112)
Creatinine, Ser: 3.15 mg/dL — ABNORMAL HIGH (ref 0.40–1.50)
GFR: 22.99 mL/min — ABNORMAL LOW (ref >60.00)
Glucose, Bld: 141 mg/dL — ABNORMAL HIGH (ref 70–99)
Potassium: 3.5 mEq/L (ref 3.5–5.1)
Sodium: 137 mEq/L (ref 135–145)
Total Bilirubin: 0.4 mg/dL (ref 0.2–1.2)
Total Protein: 6.4 g/dL (ref 6.0–8.3)

## 2019-05-01 LAB — TSH: TSH: 0.86 u[IU]/mL (ref 0.35–4.50)

## 2019-05-01 NOTE — Addendum Note (Signed)
Addended by: Miles Costain T on: 05/01/2019 04:21 PM   Modules accepted: Orders

## 2019-05-01 NOTE — Patient Instructions (Signed)
It was great seeing you today   We will follow up with you regarding your blood work   It is ok to increase Lactulose to 30 ml; if needed  Please let me know if you need anything

## 2019-05-01 NOTE — Telephone Encounter (Signed)
Sent to the pharmacy by e-scribe for 1 year per Cory. 

## 2019-05-01 NOTE — Progress Notes (Signed)
Subjective:    Patient ID: Austin [name], male    DOB: [birth year], 84 y.o.   MRN: [id number]  HPI Patient presents for yearly preventative medicine examination. He is a pleasant 84 year old male who  has a past medical history of ANEMIA DUE TO CHRONIC BLOOD LOSS (03/13/2007), CAROTID ARTERY STENOSIS (05/10/2010), CHF (congestive heart failure) ([place]), DIABETES MELLITUS, TYPE II (09/19/2007), DISEASE, CEREBROVASCULAR NEC (03/05/2007), GERD (03/13/2007), HYPERLIPIDEMIA (03/05/2007), HYPERTENSION (03/05/2007), HYPOKALEMIA (11/09/2009), KNEE PAIN, RIGHT (11/09/2009), PROSTATE CANCER, HX OF (03/05/2007), and RENAL DISEASE, CHRONIC (02/03/2009).  DM -currently prescribed glipizide 5 mg extended release.  He does monitor his blood sugars at home and reports readings in the 120's consistently  He denies any episodes of hypoglycemia Lab Results  Component Value Date   HGBA1C 6.3 (H) 03/28/2019   Hyperlipidemia -currently prescribed Lipitor 20 mg daily.  He denies fatigue or myalgia Lab Results  Component Value Date   CHOL 170 02/10/2016   HDL 40.40 02/10/2016   LDLCALC 107 (H) 02/10/2016   TRIG 111.0 02/10/2016   CHOLHDL 4 02/10/2016   Hypertension -prescribed Norvasc 10 mg daily, clonidine 0.2 MG TID, Cardura, Bidil 20-37.5 mg QID, and metoprolol 50 mg extended release daily.  He denies dizziness, lightheadedness, chest pain, shortness of breath, syncopal episode BP Readings from Last 3 Encounters:  05/01/19 (!) 130/54  04/26/19 (!) 122/52  04/03/19 (!) 170/57   CAD - Followed by Dr. Terrence Dupont.  Prescribed Plavix.  Last had Doppler ultrasound carotids in 10/2017 which showed mild stenosis.  Had echo done in August 2029 showed left ventricle with normal systolic function, EF 55 to 60%.  Cavity size was mildly dilated and mildly increased left ventricular wall thickness.  He had mild mitral annular calcification present as well as mild calcification on aortic valve.  Mild aortic valve regurgitation  CKD - Is  followed by Nephrology. Will see them next month   Constipation -most recent issue has been happening since August 2020.  This most recently placed on lactulose 15 ml and was seen by Gastroenterology. He reports that since starting Lactulose he continues to be constipated. His last BM was three days ago. He has talked to his cardiologist who said it was ok if he drank 6 glasses of water a day. He does report flatulence. has mild abdominal discomfort from time to time    History of Prostate cancer - had prostate removed in 1993  All immunizations and health maintenance protocols were reviewed with the patient and needed orders were placed. Due for flu vaccination   Appropriate screening laboratory values were ordered for the patient including screening of hyperlipidemia, renal function and hepatic function. If indicated by BPH, a PSA was ordered.  Medication reconciliation,  past medical history, social history, problem list and allergies were reviewed in detail with the patient  Goals were established with regard to weight loss, exercise, and  diet in compliance with medications  End of life planning was discussed.  Review of Systems  Constitutional: Negative.   HENT: Negative.   Eyes: Negative.   Respiratory: Negative.   Cardiovascular: Positive for leg swelling (intermittent).  Gastrointestinal: Negative.   Endocrine: Negative.   Genitourinary: Negative.   Musculoskeletal: Negative.   Skin: Negative.   Allergic/Immunologic: Negative.   Neurological: Negative.   Hematological: Negative.   Psychiatric/Behavioral: Negative.   All other systems reviewed and are negative.  Past Medical History:  Diagnosis Date  . 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 CAROTID ARTERY STENOSIS  05/10/2010  . CHF (congestive heart failure) ([place])   . DIABETES MELLITUS, TYPE II 09/19/2007  . DISEASE, CEREBROVASCULAR NEC 03/05/2007  . GERD 03/13/2007  . HYPERLIPIDEMIA 03/05/2007  . HYPERTENSION  03/05/2007  . HYPOKALEMIA 11/09/2009  . KNEE PAIN, RIGHT 11/09/2009  . PROSTATE CANCER, HX OF 03/05/2007  . RENAL DISEASE, CHRONIC 02/03/2009    Social History   Socioeconomic History  . Marital status: Married    Spouse name: Not on file  . Number of children: Not on file  . Years of education: Not on file  . Highest education level: Not on file  Occupational History  . Not on file  Social Needs  . Financial resource strain: Not on file  . Food insecurity    Worry: Not on file    Inability: Not on file  . Transportation needs    Medical: Not on file    Non-medical: Not on file  Tobacco Use  . Smoking status: Former Smoker    Types: Cigarettes    Quit date: 08/22/1974    Years since quitting: 44.7  . Smokeless tobacco: Never Used  Substance and Sexual Activity  . Alcohol use: No    Alcohol/week: 0.0 standard drinks  . Drug use: No  . Sexual activity: Not on file  Lifestyle  . Physical activity    Days per week: Not on file    Minutes per session: Not on file  . Stress: Not on file  Relationships  . Social Herbalist on phone: Not on file    Gets together: Not on file    Attends religious service: Not on file    Active member of club or organization: Not on file    Attends meetings of clubs or organizations: Not on file    Relationship status: Not on file  . Intimate partner violence    Fear of current or ex partner: Not on file    Emotionally abused: Not on file    Physically abused: Not on file    Forced sexual activity: Not on file  Other Topics Concern  . Not on file  Social History Narrative   Retired - Environmental consultant    Married 77 years       He enjoys traveling     Past Surgical History:  Procedure Laterality Date  . CAROTID ARTERY ANGIOPLASTY Right Oct. 10, 2001  . ESOPHAGOGASTRODUODENOSCOPY (EGD) WITH PROPOFOL N/A 11/04/2016   Procedure: ESOPHAGOGASTRODUODENOSCOPY (EGD) WITH PROPOFOL;  Surgeon: Otis Brace, MD;  Location: [facility];  Service: Gastroenterology;  Laterality: N/A;  . LAPAROSCOPIC APPENDECTOMY  02/20/2012   Procedure: APPENDECTOMY LAPAROSCOPIC;  Surgeon: Stark Klein, MD;  Location: [facility];  Service: General;  Laterality: N/A;  . PROSTATE SURGERY     prostatectomy    Family History  Problem Relation Age of Onset  . Hypertension Mother   . Cancer Father        Mesothelioma   . Stomach cancer Brother   . Cancer Brother   . Cancer - Cervical Brother   . Cancer Brother   . Diabetes Brother   . Esophageal cancer Neg Hx   . Colon cancer Neg Hx   . Pancreatic cancer Neg Hx     Allergies  Allergen Reactions  . Aspirin Other (See Comments)    High doses causes stomach ulcer and bleeding    Current Outpatient Medications on File Prior to Visit  Medication Sig Dispense Refill  . amLODipine (NORVASC) 10 MG  tablet TAKE 1 TABLET BY MOUTH EVERY DAY 90 tablet 0  . atorvastatin (LIPITOR) 20 MG tablet TAKE 1 TABLET BY MOUTH EVERY DAY 90 tablet 0  . cloNIDine (CATAPRES) 0.2 MG tablet Take 1 tablet (0.2 mg total) by mouth 3 (three) times daily. 90 tablet 0  . clopidogrel (PLAVIX) 75 MG tablet TAKE 1 TABLET EVERY DAY (Patient taking differently: Take 75 mg by mouth daily. ) 90 tablet 3  . doxazosin (CARDURA) 2 MG tablet Take 2 mg by mouth at bedtime.   3  . esomeprazole (NEXIUM) 40 MG capsule TAKE 1 CAPSULE BY MOUTH EVERY DAY (Patient taking differently: Take 40 mg by mouth daily at 12 noon. ) 90 capsule 3  . glipiZIDE (GLUCOTROL XL) 5 MG 24 hr tablet TAKE 1 TABLET BY MOUTH EVERY DAY (Patient taking differently: Take 5 mg by mouth daily with breakfast. ) 90 tablet 0  . glucose blood (ONETOUCH VERIO) test strip USE TO TEST BLOOD GLUCOSE ONCE DAILY 100 each 3  . hydrocortisone (ANUSOL-HC) 2.5 % rectal cream Place 1 application rectally 2 (two) times daily. 30 g 3  . isosorbide-hydrALAZINE (BIDIL) 20-37.5 MG tablet Take 1 tablet by mouth 4 (four) times daily.     Marland Kitchen lactulose (CHRONULAC) 10 GM/15ML solution  Take 15 mLs (10 g total) by mouth 2 (two) times daily as needed for mild constipation. 473 mL 1  . metoprolol succinate (TOPROL-XL) 25 MG 24 hr tablet Take 4 tablets (100 mg total) by mouth daily.    Glory Rosebush Delica Lancets 73A MISC USE TO CHECK BLOOD SUGAR DAILY. 100 each 3   No current facility-administered medications on file prior to visit.     BP (!) 130/54   Temp 97.8 F (36.6 C) (Temporal)   Ht 5' 7.75" (1.721 m)   Wt 185 lb (83.9 kg)   BMI 28.34 kg/m       Objective:   Physical Exam Vitals signs and nursing note reviewed.  Constitutional:      General: He is not in acute distress.    Appearance: Normal appearance. He is normal weight. He is not diaphoretic.  HENT:     Head: Normocephalic and atraumatic.     Right Ear: Tympanic membrane, ear canal and external ear normal. There is no impacted cerumen.     Left Ear: Tympanic membrane, ear canal and external ear normal. There is no impacted cerumen.     Nose: Nose normal. No congestion or rhinorrhea.     Mouth/Throat:     Mouth: Mucous membranes are moist.     Pharynx: Oropharynx is clear. No oropharyngeal exudate or posterior oropharyngeal erythema.  Eyes:     General: No scleral icterus.       Right eye: No discharge.        Left eye: No discharge.     Conjunctiva/sclera: Conjunctivae normal.     Pupils: Pupils are equal, round, and reactive to light.  Neck:     Musculoskeletal: Normal range of motion and neck supple.     Thyroid: No thyromegaly.     Vascular: No JVD.     Trachea: No tracheal deviation.  Cardiovascular:     Rate and Rhythm: Normal rate and regular rhythm.     Pulses: Normal pulses.     Heart sounds: Normal heart sounds. No murmur. No friction rub. No gallop.   Pulmonary:     Effort: Pulmonary effort is normal. No respiratory distress.     Breath sounds: Normal breath  sounds. No stridor. No wheezing, rhonchi or rales.  Chest:     Chest wall: No tenderness.  Abdominal:     General: Bowel  sounds are normal. There is distension.     Palpations: Abdomen is soft.     Tenderness: There is no right CVA tenderness, left CVA tenderness, guarding or rebound.     Hernia: No hernia is present.  Musculoskeletal: Normal range of motion.        General: No swelling, tenderness, deformity or signs of injury.     Right lower leg: No edema.     Left lower leg: No edema.  Lymphadenopathy:     Cervical: No cervical adenopathy.  Skin:    General: Skin is warm and dry.     Capillary Refill: Capillary refill takes less than 2 seconds.     Coloration: Skin is not jaundiced or pale.     Findings: No bruising, erythema, lesion or rash.  Neurological:     General: No focal deficit present.     Mental Status: He is alert and oriented to person, place, and time. Mental status is at baseline.     Cranial Nerves: No cranial nerve deficit.     Sensory: No sensory deficit.     Motor: No weakness or abnormal muscle tone.     Coordination: Coordination normal.     Gait: Gait normal.     Deep Tendon Reflexes: Reflexes are normal and symmetric. Reflexes normal.  Psychiatric:        Mood and Affect: Mood normal.        Behavior: Behavior normal.        Thought Content: Thought content normal.        Judgment: Judgment normal.       Assessment & Plan:  1. Routine general medical examination at a health care facility - One year follow up or sooner if needed - CBC with Differential/Platelet - Comprehensive metabolic panel - Lipid panel - TSH  2. CKD (chronic kidney disease) stage 4, GFR 15-29 ml/min (HCC) - Follow up with Nephrology as directed - Low sodium diet  - CBC with Differential/Platelet - Comprehensive metabolic panel - Lipid panel - TSH  3. Stenosis of carotid artery, unspecified laterality - Follow up with Cardiology as directed  - CBC with Differential/Platelet - Comprehensive metabolic panel - Lipid panel - TSH  4. Diabetes mellitus due to underlying condition with  diabetic nephropathy, without long-term current use of insulin (HCC) - No change in medications  - CBC with Differential/Platelet - Comprehensive metabolic panel - Lipid panel - TSH  5. Dyslipidemia - Continue statin  - CBC with Differential/Platelet - Comprehensive metabolic panel - Lipid panel - TSH  6. Essential hypertension - Well controlled. No change in medications  - CBC with Differential/Platelet - Comprehensive metabolic panel - Lipid panel - TSH   7. PROSTATE CANCER, HX OF   8.  Slow transit constipation - OK to increase Lactulose to 30 ml if needed  Dorothyann Peng, NP

## 2019-05-03 NOTE — Telephone Encounter (Signed)
DENIED.  FILLED ON 04/23/2019.  PLEASE CHECK FILE.

## 2019-05-12 ENCOUNTER — Other Ambulatory Visit: Payer: Self-pay | Admitting: Adult Health

## 2019-05-15 NOTE — Telephone Encounter (Signed)
Correct, he should not be taking this medication unless instructed to by his kidney doctor

## 2019-05-19 ENCOUNTER — Other Ambulatory Visit: Payer: Self-pay | Admitting: Adult Health

## 2019-05-19 DIAGNOSIS — K5900 Constipation, unspecified: Secondary | ICD-10-CM

## 2019-05-21 NOTE — Telephone Encounter (Signed)
DENIED.  FILLED ON 04/23/2019.  REQUEST IS TOO EARLY.  THANKS

## 2019-05-24 ENCOUNTER — Other Ambulatory Visit: Payer: Self-pay | Admitting: Adult Health

## 2019-05-24 DIAGNOSIS — K5900 Constipation, unspecified: Secondary | ICD-10-CM

## 2019-05-28 NOTE — Telephone Encounter (Signed)
SENT TO THE PHARMACY BY E-SCRIBE. 

## 2019-05-30 ENCOUNTER — Other Ambulatory Visit: Payer: Self-pay | Admitting: Adult Health

## 2019-05-31 NOTE — Telephone Encounter (Signed)
Sent to the pharmacy by e-scribe. 

## 2019-06-03 ENCOUNTER — Telehealth: Payer: Self-pay | Admitting: Adult Health

## 2019-06-03 NOTE — Telephone Encounter (Signed)
Medication: esomeprazole (NEXIUM) 40 MG capsule [[id number]]    Pharmacy:  [id number] - [place], [place]. AT [address] [phone] (Phone) [phone] (Fax)   Pt aware of turn around time

## 2019-06-04 MED ORDER — ESOMEPRAZOLE MAGNESIUM 40 MG PO CPDR: DELAYED_RELEASE_CAPSULE | ORAL | 3 refills | Status: DC

## 2019-06-04 NOTE — Telephone Encounter (Signed)
Sent to the pharmacy by e-scribe. 

## 2019-06-05 ENCOUNTER — Other Ambulatory Visit: Payer: Self-pay | Admitting: Adult Health

## 2019-06-05 NOTE — Telephone Encounter (Signed)
Sent to the pharmacy by e-scribe. 

## 2019-06-05 NOTE — Telephone Encounter (Signed)
Six months for him

## 2019-07-09 ENCOUNTER — Other Ambulatory Visit: Payer: Self-pay | Admitting: Adult Health

## 2019-07-10 NOTE — Telephone Encounter (Signed)
Sent to the pharmacy by e-scribe. 

## 2019-08-04 ENCOUNTER — Inpatient Hospital Stay (HOSPITAL_COMMUNITY)
Admission: EM | Admit: 2019-08-04 | Discharge: 2019-08-06 | DRG: 291 | Disposition: A | Discharge status: Home / Self Care | Payer: Medicare Other | Attending: Internal Medicine | Admitting: Internal Medicine

## 2019-08-04 ENCOUNTER — Encounter (HOSPITAL_COMMUNITY): Payer: Self-pay | Admitting: *Deleted

## 2019-08-04 ENCOUNTER — Emergency Department (HOSPITAL_COMMUNITY): Payer: Medicare Other

## 2019-08-04 ENCOUNTER — Other Ambulatory Visit: Payer: Self-pay

## 2019-08-04 DIAGNOSIS — E785 Hyperlipidemia, unspecified: Secondary | ICD-10-CM | POA: Diagnosis present

## 2019-08-04 DIAGNOSIS — Z8249 Family history of ischemic heart disease and other diseases of the circulatory system: Secondary | ICD-10-CM

## 2019-08-04 DIAGNOSIS — Z886 Allergy status to analgesic agent status: Secondary | ICD-10-CM

## 2019-08-04 DIAGNOSIS — Z7984 Long term (current) use of oral hypoglycemic drugs: Secondary | ICD-10-CM

## 2019-08-04 DIAGNOSIS — I509 Heart failure, unspecified: Secondary | ICD-10-CM

## 2019-08-04 DIAGNOSIS — Z7902 Long term (current) use of antithrombotics/antiplatelets: Secondary | ICD-10-CM | POA: Diagnosis not present

## 2019-08-04 DIAGNOSIS — N189 Chronic kidney disease, unspecified: Secondary | ICD-10-CM | POA: Diagnosis present

## 2019-08-04 DIAGNOSIS — Z6830 Body mass index (BMI) 30.0-30.9, adult: Secondary | ICD-10-CM

## 2019-08-04 DIAGNOSIS — I130 Hypertensive heart and chronic kidney disease with heart failure and stage 1 through stage 4 chronic kidney disease, or unspecified chronic kidney disease: Secondary | ICD-10-CM | POA: Diagnosis present

## 2019-08-04 DIAGNOSIS — N184 Chronic kidney disease, stage 4 (severe): Secondary | ICD-10-CM | POA: Diagnosis present

## 2019-08-04 DIAGNOSIS — Z20828 Contact with and (suspected) exposure to other viral communicable diseases: Secondary | ICD-10-CM | POA: Diagnosis present

## 2019-08-04 DIAGNOSIS — E0821 Diabetes mellitus due to underlying condition with diabetic nephropathy: Secondary | ICD-10-CM | POA: Diagnosis not present

## 2019-08-04 DIAGNOSIS — Z833 Family history of diabetes mellitus: Secondary | ICD-10-CM | POA: Diagnosis not present

## 2019-08-04 DIAGNOSIS — I248 Other forms of acute ischemic heart disease: Secondary | ICD-10-CM | POA: Diagnosis present

## 2019-08-04 DIAGNOSIS — I10 Essential (primary) hypertension: Secondary | ICD-10-CM | POA: Diagnosis present

## 2019-08-04 DIAGNOSIS — Z79899 Other long term (current) drug therapy: Secondary | ICD-10-CM

## 2019-08-04 DIAGNOSIS — E1122 Type 2 diabetes mellitus with diabetic chronic kidney disease: Secondary | ICD-10-CM | POA: Diagnosis present

## 2019-08-04 DIAGNOSIS — I6529 Occlusion and stenosis of unspecified carotid artery: Secondary | ICD-10-CM | POA: Diagnosis present

## 2019-08-04 DIAGNOSIS — E876 Hypokalemia: Secondary | ICD-10-CM | POA: Diagnosis present

## 2019-08-04 DIAGNOSIS — E1129 Type 2 diabetes mellitus with other diabetic kidney complication: Secondary | ICD-10-CM | POA: Diagnosis present

## 2019-08-04 DIAGNOSIS — Z8546 Personal history of malignant neoplasm of prostate: Secondary | ICD-10-CM

## 2019-08-04 DIAGNOSIS — Z87891 Personal history of nicotine dependence: Secondary | ICD-10-CM

## 2019-08-04 DIAGNOSIS — I5033 Acute on chronic diastolic (congestive) heart failure: Secondary | ICD-10-CM | POA: Diagnosis present

## 2019-08-04 DIAGNOSIS — K219 Gastro-esophageal reflux disease without esophagitis: Secondary | ICD-10-CM | POA: Diagnosis present

## 2019-08-04 DIAGNOSIS — R7989 Other specified abnormal findings of blood chemistry: Secondary | ICD-10-CM | POA: Diagnosis present

## 2019-08-04 DIAGNOSIS — N185 Chronic kidney disease, stage 5: Secondary | ICD-10-CM | POA: Diagnosis present

## 2019-08-04 DIAGNOSIS — E669 Obesity, unspecified: Secondary | ICD-10-CM | POA: Diagnosis present

## 2019-08-04 DIAGNOSIS — E1169 Type 2 diabetes mellitus with other specified complication: Secondary | ICD-10-CM | POA: Diagnosis present

## 2019-08-04 DIAGNOSIS — R0902 Hypoxemia: Secondary | ICD-10-CM | POA: Diagnosis present

## 2019-08-04 DIAGNOSIS — D631 Anemia in chronic kidney disease: Secondary | ICD-10-CM | POA: Diagnosis present

## 2019-08-04 DIAGNOSIS — R778 Other specified abnormalities of plasma proteins: Secondary | ICD-10-CM | POA: Diagnosis not present

## 2019-08-04 DIAGNOSIS — I160 Hypertensive urgency: Secondary | ICD-10-CM | POA: Diagnosis present

## 2019-08-04 DIAGNOSIS — N179 Acute kidney failure, unspecified: Secondary | ICD-10-CM | POA: Diagnosis present

## 2019-08-04 HISTORY — DX: Heart failure, unspecified: I50.9

## 2019-08-04 LAB — COMPREHENSIVE METABOLIC PANEL
ALT: 9 U/L (ref 0–44)
AST: 11 U/L — ABNORMAL LOW (ref 15–41)
Albumin: 3.6 g/dL (ref 3.5–5.0)
Alkaline Phosphatase: 64 U/L (ref 38–126)
Anion gap: 10 (ref 5–15)
BUN: 22 mg/dL (ref 8–23)
CO2: 22 mmol/L (ref 22–32)
Calcium: 9 mg/dL (ref 8.9–10.3)
Chloride: 109 mmol/L (ref 98–111)
Creatinine, Ser: 2.67 mg/dL — ABNORMAL HIGH (ref 0.61–1.24)
GFR calc Af Amer: 25 mL/min — ABNORMAL LOW (ref >60)
GFR calc non Af Amer: 21 mL/min — ABNORMAL LOW (ref >60)
Glucose, Bld: 140 mg/dL — ABNORMAL HIGH (ref 70–99)
Potassium: 3.4 mmol/L — ABNORMAL LOW (ref 3.5–5.1)
Sodium: 141 mmol/L (ref 135–145)
Total Bilirubin: 1.1 mg/dL (ref 0.3–1.2)
Total Protein: 6.6 g/dL (ref 6.5–8.1)

## 2019-08-04 LAB — CBC WITH DIFFERENTIAL/PLATELET
Abs Immature Granulocytes: 0.02 10*3/uL (ref 0.00–0.07)
Basophils Absolute: 0 10*3/uL (ref 0.0–0.1)
Basophils Relative: 1 %
Eosinophils Absolute: 0.1 10*3/uL (ref 0.0–0.5)
Eosinophils Relative: 2 %
HCT: 31.5 % — ABNORMAL LOW (ref 39.0–52.0)
Hemoglobin: 10.1 g/dL — ABNORMAL LOW (ref 13.0–17.0)
Immature Granulocytes: 0 %
Lymphocytes Relative: 15 %
Lymphs Abs: 0.9 10*3/uL (ref 0.7–4.0)
MCH: 26.7 pg (ref 26.0–34.0)
MCHC: 32.1 g/dL (ref 30.0–36.0)
MCV: 83.3 fL (ref 80.0–100.0)
Monocytes Absolute: 0.5 10*3/uL (ref 0.1–1.0)
Monocytes Relative: 8 %
Neutro Abs: 4.6 10*3/uL (ref 1.7–7.7)
Neutrophils Relative %: 74 %
Platelets: 259 10*3/uL (ref 150–400)
RBC: 3.78 MIL/uL — ABNORMAL LOW (ref 4.22–5.81)
RDW: 15.4 % (ref 11.5–15.5)
WBC: 6.1 10*3/uL (ref 4.0–10.5)
nRBC: 0 % (ref 0.0–0.2)

## 2019-08-04 LAB — BRAIN NATRIURETIC PEPTIDE: B Natriuretic Peptide: 1062 pg/mL — ABNORMAL HIGH (ref 0.0–100.0)

## 2019-08-04 LAB — TROPONIN I (HIGH SENSITIVITY)
Troponin I (High Sensitivity): 20 ng/L — ABNORMAL HIGH (ref <18)
Troponin I (High Sensitivity): 24 ng/L — ABNORMAL HIGH (ref <18)

## 2019-08-04 LAB — SARS CORONAVIRUS 2 (TAT 6-24 HRS): SARS Coronavirus 2: NEGATIVE

## 2019-08-04 LAB — HEMOGLOBIN A1C
Hgb A1c MFr Bld: 7.2 % — ABNORMAL HIGH (ref 4.8–5.6)
Mean Plasma Glucose: 159.94 mg/dL

## 2019-08-04 LAB — CBG MONITORING, ED: Glucose-Capillary: 119 mg/dL — ABNORMAL HIGH (ref 70–99)

## 2019-08-04 LAB — TSH: TSH: 1.171 u[IU]/mL (ref 0.350–4.500)

## 2019-08-04 LAB — POC SARS CORONAVIRUS 2 AG -  ED: SARS Coronavirus 2 Ag: NEGATIVE

## 2019-08-04 LAB — GLUCOSE, CAPILLARY
Glucose-Capillary: 136 mg/dL — ABNORMAL HIGH (ref 70–99)
Glucose-Capillary: 172 mg/dL — ABNORMAL HIGH (ref 70–99)

## 2019-08-04 MED ORDER — METOPROLOL SUCCINATE ER 100 MG PO TB24
100.0000 mg | ORAL_TABLET | Freq: Once | ORAL | Status: AC
  Administered 2019-08-04: 100 mg via ORAL
  Filled 2019-08-04: qty 1

## 2019-08-04 MED ORDER — SODIUM CHLORIDE 0.9% FLUSH
3.0000 mL | Freq: Two times a day (BID) | INTRAVENOUS | Status: DC
  Administered 2019-08-04 – 2019-08-05 (×3): 3 mL via INTRAVENOUS

## 2019-08-04 MED ORDER — ATORVASTATIN CALCIUM 10 MG PO TABS
20.0000 mg | ORAL_TABLET | Freq: Every day | ORAL | Status: DC
  Administered 2019-08-05: 20 mg via ORAL
  Filled 2019-08-04: qty 2

## 2019-08-04 MED ORDER — HYDRALAZINE HCL 20 MG/ML IJ SOLN: 10.0000 mg | INTRAMUSCULAR | Status: DC | PRN

## 2019-08-04 MED ORDER — SODIUM CHLORIDE 0.9% FLUSH
3.0000 mL | INTRAVENOUS | Status: DC | PRN
  Administered 2019-08-06: 09:00:00 3 mL via INTRAVENOUS

## 2019-08-04 MED ORDER — FUROSEMIDE 10 MG/ML IJ SOLN
40.0000 mg | Freq: Two times a day (BID) | INTRAMUSCULAR | Status: DC
  Administered 2019-08-04 – 2019-08-06 (×4): 40 mg via INTRAVENOUS
  Filled 2019-08-04 (×4): qty 4

## 2019-08-04 MED ORDER — CLONIDINE HCL 0.2 MG PO TABS
0.2000 mg | ORAL_TABLET | Freq: Three times a day (TID) | ORAL | Status: DC
  Administered 2019-08-04 – 2019-08-06 (×6): 0.2 mg via ORAL
  Filled 2019-08-04 (×6): qty 1

## 2019-08-04 MED ORDER — POTASSIUM CHLORIDE CRYS ER 20 MEQ PO TBCR
20.0000 meq | EXTENDED_RELEASE_TABLET | ORAL | Status: AC
  Administered 2019-08-04: 20 meq via ORAL
  Filled 2019-08-04: qty 1

## 2019-08-04 MED ORDER — METOPROLOL SUCCINATE ER 100 MG PO TB24
100.0000 mg | ORAL_TABLET | Freq: Every day | ORAL | Status: DC
  Administered 2019-08-05 – 2019-08-06 (×2): 100 mg via ORAL
  Filled 2019-08-04 (×2): qty 1

## 2019-08-04 MED ORDER — PANTOPRAZOLE SODIUM 40 MG PO TBEC
80.0000 mg | DELAYED_RELEASE_TABLET | Freq: Every day | ORAL | Status: DC
  Administered 2019-08-04 – 2019-08-05 (×2): 80 mg via ORAL
  Filled 2019-08-04 (×2): qty 2

## 2019-08-04 MED ORDER — ACETAMINOPHEN 325 MG PO TABS: 650.0000 mg | ORAL_TABLET | ORAL | Status: DC | PRN

## 2019-08-04 MED ORDER — GLIPIZIDE ER 5 MG PO TB24
5.0000 mg | ORAL_TABLET | Freq: Once | ORAL | Status: DC
  Filled 2019-08-04: qty 1

## 2019-08-04 MED ORDER — ONDANSETRON HCL 4 MG/2ML IJ SOLN: 4.0000 mg | Freq: Four times a day (QID) | INTRAMUSCULAR | Status: DC | PRN

## 2019-08-04 MED ORDER — ISOSORB DINITRATE-HYDRALAZINE 20-37.5 MG PO TABS
1.0000 | ORAL_TABLET | Freq: Once | ORAL | Status: AC
  Administered 2019-08-04: 1 via ORAL
  Filled 2019-08-04: qty 1

## 2019-08-04 MED ORDER — AMLODIPINE BESYLATE 5 MG PO TABS
10.0000 mg | ORAL_TABLET | Freq: Once | ORAL | Status: AC
  Administered 2019-08-04: 10 mg via ORAL
  Filled 2019-08-04: qty 2

## 2019-08-04 MED ORDER — AMLODIPINE BESYLATE 10 MG PO TABS
10.0000 mg | ORAL_TABLET | Freq: Every day | ORAL | Status: DC
  Administered 2019-08-05 – 2019-08-06 (×2): 10 mg via ORAL
  Filled 2019-08-04 (×2): qty 1

## 2019-08-04 MED ORDER — FUROSEMIDE 10 MG/ML IJ SOLN
40.0000 mg | Freq: Once | INTRAMUSCULAR | Status: AC
  Administered 2019-08-04: 40 mg via INTRAVENOUS
  Filled 2019-08-04: qty 4

## 2019-08-04 MED ORDER — HEPARIN SODIUM (PORCINE) 5000 UNIT/ML IJ SOLN
5000.0000 [IU] | Freq: Three times a day (TID) | INTRAMUSCULAR | Status: DC
  Administered 2019-08-04 – 2019-08-06 (×5): 5000 [IU] via SUBCUTANEOUS
  Filled 2019-08-04 (×5): qty 1

## 2019-08-04 MED ORDER — ATORVASTATIN CALCIUM 10 MG PO TABS
20.0000 mg | ORAL_TABLET | Freq: Once | ORAL | Status: AC
  Administered 2019-08-04: 20 mg via ORAL
  Filled 2019-08-04: qty 2

## 2019-08-04 MED ORDER — CLOPIDOGREL BISULFATE 75 MG PO TABS
75.0000 mg | ORAL_TABLET | Freq: Every day | ORAL | Status: DC
  Administered 2019-08-05 – 2019-08-06 (×2): 75 mg via ORAL
  Filled 2019-08-04 (×2): qty 1

## 2019-08-04 MED ORDER — CLONIDINE HCL 0.2 MG PO TABS
0.2000 mg | ORAL_TABLET | Freq: Once | ORAL | Status: AC
  Administered 2019-08-04: 0.2 mg via ORAL
  Filled 2019-08-04: qty 1

## 2019-08-04 MED ORDER — SODIUM CHLORIDE 0.9 % IV SOLN: 250.0000 mL | INTRAVENOUS | Status: DC | PRN

## 2019-08-04 MED ORDER — CLOPIDOGREL BISULFATE 75 MG PO TABS
75.0000 mg | ORAL_TABLET | Freq: Once | ORAL | Status: AC
  Administered 2019-08-04: 75 mg via ORAL
  Filled 2019-08-04: qty 1

## 2019-08-04 MED ORDER — INSULIN ASPART 100 UNIT/ML ~~LOC~~ SOLN: 0.0000 [IU] | Freq: Three times a day (TID) | SUBCUTANEOUS | Status: DC

## 2019-08-04 NOTE — Progress Notes (Signed)
Patient arrived to 5C04 from ED accompanied by spouse. Safety precautions and orders reviewed with pt/family. VSS. TELE applied and confirmed with Christy, CCMD. Pt only noted with sob on exertion and remain not in distress sitting in chair, no sob noted. Dinner ordered. Pt sat at 94% RA. 2L applied for comfort. Will continue to monitor.  Ave Filter, RN

## 2019-08-04 NOTE — H&P (Signed)
History and Physical    Austin [name] YPP:[id number] DOB: July 23, 1935 DOA: 08/04/2019  Referring MD/NP/PA: Crist Infante, MD PCP: Dorothyann Peng, NP  Patient coming from: Home  Chief Complaint: Shortness of breath  I have personally briefly reviewed patient's old medical records in [place]   HPI: Austin [name] is a 84 y.o. male with medical history significant of hypertension, hyperlipidemia, diastolic congestive heart failure last EF 55-60% followed by Dr. Terrence Dupont, diabetes mellitus type 2, anemia due to chronic blood loss, chronic kidney disease stage IV followed by Dr. Royce Macadamia, remote history of tobacco use, GERD, and prostate cancer.  He presents with a 3-day history of progressively worsening shortness of breath.  Complains of being very winded with any kind of exertion.  He initially noticed that his feet and leg started swelling approximately 2 weeks ago. Associated symptoms included wheezing at night and early morning, flashes of chest pain, dry intermittent cough, and constipation.   Reports being on furosemide as needed, but did not start taking the medication when leg started swelling.  Denies any significant fever, chills, nausea, vomiting, diarrhea, or dysuria.  Patient reports utilizing only 2 pillows at night and has not been checking his weight routinely.  However, last night reported feeling as though there was a weight on his chest which was unusual for him and made him come to the hospital for further evaluation.  ED Course: Upon admission to the emergency department patient was noted to be afebrile, blood pressures elevated up to 234/185, and O2 saturations as low as 89% with ambulation on room air.  Labs significant for hemoglobin 10.1, potassium 3.4, BUN 22, creatinine 2.67, BNP 1062, and troponin 20.  Chest x-ray concerning for asymmetric edema.  Patient was given his home blood pressure medications and 40 mg of furosemide IV.  TRH called to admit for congestive heart  failure exacerbation.  Point-of-care COVID-19 screening was negative.  Review of Systems  Constitutional: Positive for malaise/fatigue. Negative for fever.  HENT: Positive for congestion. Negative for sinus pain.   Eyes: Negative for photophobia.  Respiratory: Positive for cough, shortness of breath and wheezing. Negative for sputum production.   Cardiovascular: Positive for chest pain and leg swelling.  Gastrointestinal: Positive for constipation. Negative for abdominal pain, nausea and vomiting.  Genitourinary: Negative for dysuria and hematuria.  Musculoskeletal: Negative for falls and myalgias.  Neurological: Negative for focal weakness and loss of consciousness.  Psychiatric/Behavioral: Negative for memory loss and substance abuse.    Past Medical History:  Diagnosis Date  . 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 CAROTID ARTERY STENOSIS 05/10/2010  . CHF (congestive heart failure) ([place])   . DIABETES MELLITUS, TYPE II 09/19/2007  . DISEASE, CEREBROVASCULAR NEC 03/05/2007  . GERD 03/13/2007  . HYPERLIPIDEMIA 03/05/2007  . HYPERTENSION 03/05/2007  . HYPOKALEMIA 11/09/2009  . KNEE PAIN, RIGHT 11/09/2009  . PROSTATE CANCER, HX OF 03/05/2007  . RENAL DISEASE, CHRONIC 02/03/2009    Past Surgical History:  Procedure Laterality Date  . CAROTID ARTERY ANGIOPLASTY Right Oct. 10, 2001  . ESOPHAGOGASTRODUODENOSCOPY (EGD) WITH PROPOFOL N/A 11/04/2016   Procedure: ESOPHAGOGASTRODUODENOSCOPY (EGD) WITH PROPOFOL;  Surgeon: Otis Brace, MD;  Location: [facility];  Service: Gastroenterology;  Laterality: N/A;  . LAPAROSCOPIC APPENDECTOMY  02/20/2012   Procedure: APPENDECTOMY LAPAROSCOPIC;  Surgeon: Stark Klein, MD;  Location: [facility];  Service: General;  Laterality: N/A;  . PROSTATE SURGERY     prostatectomy     reports that he quit smoking about 44 years ago.  His smoking use included cigarettes. He has never used smokeless tobacco. He reports that he does not drink alcohol or use  drugs.  Allergies  Allergen Reactions  . Aspirin Other (See Comments)    High doses causes stomach ulcer and bleeding    Family History  Problem Relation Age of Onset  . Hypertension Mother   . Cancer Father        Mesothelioma   . Stomach cancer Brother   . Cancer Brother   . Cancer - Cervical Brother   . Cancer Brother   . Diabetes Brother   . Esophageal cancer Neg Hx   . Colon cancer Neg Hx   . Pancreatic cancer Neg Hx     Prior to Admission medications   Medication Sig Start Date End Date Taking? Authorizing Provider  amLODipine (NORVASC) 10 MG tablet TAKE 1 TABLET BY MOUTH EVERY DAY 07/10/19   Nafziger, Tommi Rumps, NP  atorvastatin (LIPITOR) 20 MG tablet TAKE 1 TABLET BY MOUTH EVERY DAY 07/10/19   Nafziger, Tommi Rumps, NP  cloNIDine (CATAPRES) 0.2 MG tablet TAKE 1 TABLET (0.2 MG TOTAL) BY MOUTH 3 (THREE) TIMES DAILY. 05/01/19   Nafziger, Tommi Rumps, NP  clopidogrel (PLAVIX) 75 MG tablet TAKE 1 TABLET BY MOUTH EVERY DAY 05/31/19   Nafziger, Tommi Rumps, NP  doxazosin (CARDURA) 2 MG tablet Take 2 mg by mouth at bedtime.  12/28/17   [provider]  esomeprazole (NEXIUM) 40 MG capsule TAKE 1 CAPSULE BY MOUTH EVERY DAY 06/04/19   Nafziger, Tommi Rumps, NP  glipiZIDE (GLUCOTROL XL) 5 MG 24 hr tablet TAKE 1 TABLET BY MOUTH EVERY DAY 06/05/19   Nafziger, Tommi Rumps, NP  glucose blood (ONETOUCH VERIO) test strip USE TO TEST BLOOD GLUCOSE ONCE DAILY 03/08/19   Nafziger, Tommi Rumps, NP  hydrocortisone (ANUSOL-HC) 2.5 % rectal cream PLACE 1 APPLICATION RECTALLY 2 TIMES A DAY. 06/07/19   Nafziger, Tommi Rumps, NP  isosorbide-hydrALAZINE (BIDIL) 20-37.5 MG tablet Take 1 tablet by mouth 4 (four) times daily.     [provider]  lactulose (CHRONULAC) 10 GM/15ML solution TAKE 15 ML BY MOUTH 2 TIMES DAILY AS NEEDED FOR MILD CONSTIPATION. 05/28/19   Nafziger, Tommi Rumps, NP  metoprolol succinate (TOPROL-XL) 25 MG 24 hr tablet Take 4 tablets (100 mg total) by mouth daily. 03/29/19   Mariel Aloe, MD  OneTouch Delica Lancets 73U MISC USE  TO CHECK BLOOD SUGAR DAILY. 04/18/19   Dorothyann Peng, NP    Physical Exam:  Constitutional: Elderly male who appears to be in no acute distress at this time. Vitals:   08/04/19 0900 08/04/19 0903 08/04/19 0950 08/04/19 0955  BP: (!) 228/187 (!) 225/193 (!) 207/91   Pulse: 72 72 84 81  Resp: 18 12 16 18   Temp:      TempSrc:      SpO2: 92% 91% (!) 89% 91%  Weight:      Height:       Eyes: PERRL, lids and conjunctivae normal ENMT: Mucous membranes are moist. Posterior pharynx clear of any exudate or lesions.  Neck: normal, supple, no masses, no thyromegaly.  Positive JVD Respiratory: Normal respiratory effort with positive crackles heard in both lung fields.  Currently on 2 L nasal cannula oxygen and able to talk in complete sentences. Cardiovascular: Regular rate and rhythm, no murmurs / rubs / gallops.  2-3+ pitting bilateral lower extremity edema to the thigh. 2+ pedal pulses. No carotid bruits.  Abdomen: no tenderness, no masses palpated. No hepatosplenomegaly. Bowel sounds positive.  Musculoskeletal: no clubbing /  cyanosis. No joint deformity upper and lower extremities. Good ROM, no contractures. Normal muscle tone.  Skin: no rashes, lesions, ulcers. No induration Neurologic: CN 2-12 grossly intact. Sensation intact, DTR normal. Strength 5/5 in all 4.  Psychiatric: Normal judgment and insight. Alert and oriented x 3. Normal mood.     Labs on Admission: I have personally reviewed following labs and imaging studies  CBC: Recent Labs  Lab 08/04/19 0508  WBC 6.1  NEUTROABS 4.6  HGB 10.1*  HCT 31.5*  MCV 83.3  PLT 789   Basic Metabolic Panel: Recent Labs  Lab 08/04/19 0508  NA 141  K 3.4*  CL 109  CO2 22  GLUCOSE 140*  BUN 22  CREATININE 2.67*  CALCIUM 9.0   GFR: Estimated Creatinine Clearance: 22.4 mL/min (A) (by C-G formula based on SCr of 2.67 mg/dL (H)). Liver Function Tests: Recent Labs  Lab 08/04/19 0508  AST 11*  ALT 9  ALKPHOS 64  BILITOT 1.1   PROT 6.6  ALBUMIN 3.6   No results for input(s): LIPASE, AMYLASE in the last 168 hours. No results for input(s): AMMONIA in the last 168 hours. Coagulation Profile: No results for input(s): INR, PROTIME in the last 168 hours. Cardiac Enzymes: No results for input(s): CKTOTAL, CKMB, CKMBINDEX, TROPONINI in the last 168 hours. BNP (last 3 results) No results for input(s): PROBNP in the last 8760 hours. HbA1C: No results for input(s): HGBA1C in the last 72 hours. CBG: No results for input(s): GLUCAP in the last 168 hours. Lipid Profile: No results for input(s): CHOL, HDL, LDLCALC, TRIG, CHOLHDL, LDLDIRECT in the last 72 hours. Thyroid Function Tests: No results for input(s): TSH, T4TOTAL, FREET4, T3FREE, THYROIDAB in the last 72 hours. Anemia Panel: No results for input(s): VITAMINB12, FOLATE, FERRITIN, TIBC, IRON, RETICCTPCT in the last 72 hours. Urine analysis:    Component Value Date/Time   COLORURINE YELLOW 04/03/2019 1640   APPEARANCEUR CLEAR 04/03/2019 1640   LABSPEC 1.017 04/03/2019 1640   PHURINE 5.0 04/03/2019 1640   GLUCOSEU 50 (A) 04/03/2019 1640   HGBUR NEGATIVE 04/03/2019 1640   HGBUR negative 09/19/2007 0000   BILIRUBINUR NEGATIVE 04/03/2019 1640   BILIRUBINUR n 11/18/2014 1003   KETONESUR NEGATIVE 04/03/2019 1640   PROTEINUR >=300 (A) 04/03/2019 1640   UROBILINOGEN 0.2 11/18/2014 1003   UROBILINOGEN 1.0 01/26/2013 2059   NITRITE NEGATIVE 04/03/2019 1640   LEUKOCYTESUR NEGATIVE 04/03/2019 1640   Sepsis Labs: No results found for this or any previous visit (from the past 240 hour(s)).   Radiological Exams on Admission: DG Chest 2 View  Result Date: 08/04/2019 CLINICAL DATA:  Short of breath. EXAM: CHEST - 2 VIEW COMPARISON:  03/27/2019 FINDINGS: Cardiac silhouette is enlarged. There is fine airspace disease in the RIGHT lower lobe and RIGHT upper lobe which is very similar to radiograph 03/27/2019. LEFT lung clear. No pneumothorax. No pleural fluid.  IMPRESSION: Fine asymmetric airspace disease in the RIGHT upper lobe and RIGHT lower lobe not changed from 03/27/2019. Findings suggest asymmetric edema over infection. Electronically Signed   By: Suzy Bouchard M.D.   On: 08/04/2019 05:38    EKG: Independently reviewed.  Sinus rhythm at 64 bpm with first-degree heart block and similar ST wave changes from previous EKG.  Assessment/Plan Diastolic congestive heart failure exacerbation: Acute.  Patient presents with complaints of shortness of breath.  Chest x-ray concerning for asymmetric edema.  BNP elevated at 1062.  Last EF noted to be 55 to 60% in August of this year.  He  had been given 40 mg of Lasix IV in the emergency department. -Admit to a telemetry bed -Heart failure orders set  initiated  -Continuous pulse oximetry with nasal cannula oxygen as needed to keep O2 saturations >92% -Strict I&Os and daily weights -Elevate lower extremities -Check TSH -Lasix 40 mg IV Bid -Reassess in a.m. and adjust diuresis as needed. -Defer need of repeat echocardiogram to cardiology -Optimize medical management as able -Dr. Terrence Dupont to be notified of the patient's admission  Hypertensive urgency/emergency: Patient presents with systolic blood pressures elevated up to 234.   -Continue home blood pressure regimen -Hydralazine IV as needed for elevated blood pressures  Elevated troponin, chest pain: Acute.  Patient reports intermittent flashes of chest pain initial troponin mildly elevated at 20 ->24 on admission.  EKG otherwise similar to previous.  Suspect secondary to demand in the setting of congestive heart failure exacerbation.  Hypokalemia: Acute.  Initial potassium 3.4. -Give 20 mEq of potassium chloride x1 dose now -Continue to monitor and replace as needed  Chronic kidney disease stage IV: Patient follows with Dr. Royce Macadamia of [facility] in the outpatient setting.  He was supposed to have a follow-up appointment this month, but reports  that its not been made yet. -Continue to monitor kidney function while diuresing -Recommend resetting up follow-up for this December  Anemia of chronic disease: Hemoglobin 10.1 on admission which appears near patient's baseline.  Patient denies any reports of bleeding. -Continue to monitor  Diabetes mellitus type 2: Controlled.  Last hemoglobin A1c 6.3 on 03/28/2019.  Initial blood glucose within normal limits.  Patient on oral medications of glipizide at home.  -Hypoglycemic protocols -Hold glipizide -CBGs before every meal with very sensitive SSI  Hyperlipidemia -Continue atorvastatin  GERD -Continue pharmacy substitution for Nexium  DVT prophylaxis: Heparin Code Status: Full Family Communication: No family present at bedside Disposition Plan: Likely discharge home in 2 to 3 days Consults called: Cardiology Admission status: Inpatient  Norval Morton MD [facility] Pager [phone]   If 7PM-7AM, please contact night-coverage [email or web address] Password [email or web address]  08/04/2019, 10:25 AM

## 2019-08-04 NOTE — ED Provider Notes (Signed)
[facility] EMERGENCY DEPARTMENT Provider Note   CSN: [id number] Arrival date & time: 08/04/19  0445     History Chief Complaint  Patient presents with  . Shortness of Breath    Austin [name] is a 84 y.o. male.  HPI 84 year old male presents with shortness of breath.  Has been ongoing for about 3 or 4 days.  A little bit of cough that really is more to clear his throat and congestion and he feels like his chest is congested and has wheezing.  Some intermittent chest pain that is mild and feels like a tightening.  Has also noticed acute on chronic leg swelling bilaterally.  No fevers.  He is on as needed Lasix as the Lasix seem to cause kidney injury but he has not been taking it recently.  Past Medical History:  Diagnosis Date  . 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 CAROTID ARTERY STENOSIS 05/10/2010  . CHF (congestive heart failure) ([place])   . DIABETES MELLITUS, TYPE II 09/19/2007  . DISEASE, CEREBROVASCULAR NEC 03/05/2007  . GERD 03/13/2007  . HYPERLIPIDEMIA 03/05/2007  . HYPERTENSION 03/05/2007  . HYPOKALEMIA 11/09/2009  . KNEE PAIN, RIGHT 11/09/2009  . PROSTATE CANCER, HX OF 03/05/2007  . RENAL DISEASE, CHRONIC 02/03/2009    Patient Active Problem List   Diagnosis Date Noted  . Hypertensive urgency 03/28/2019  . CKD (chronic kidney disease), stage III 03/28/2019  . 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 Gastrointestinal hemorrhage with melena   . Melena 11/03/2016  . Carotid artery stenosis 05/10/2010  . Diabetes mellitus with renal complications ([place]) 22/09/5425  . Iron deficiency anemia due to chronic blood loss 03/13/2007  . GERD 03/13/2007  . Dyslipidemia 03/05/2007  . Essential hypertension 03/05/2007  . DISEASE, CEREBROVASCULAR NEC 03/05/2007  . PROSTATE CANCER, HX OF 03/05/2007    Past Surgical History:  Procedure Laterality Date  . CAROTID ARTERY ANGIOPLASTY Right Oct. 10, 2001  . ESOPHAGOGASTRODUODENOSCOPY (EGD)  WITH PROPOFOL N/A 11/04/2016   Procedure: ESOPHAGOGASTRODUODENOSCOPY (EGD) WITH PROPOFOL;  Surgeon: Otis Brace, MD;  Location: [facility];  Service: Gastroenterology;  Laterality: N/A;  . LAPAROSCOPIC APPENDECTOMY  02/20/2012   Procedure: APPENDECTOMY LAPAROSCOPIC;  Surgeon: Stark Klein, MD;  Location[facility];  Service: General;  Laterality: N/A;  . PROSTATE SURGERY     prostatectomy       Family History  Problem Relation Age of Onset  . Hypertension Mother   . Cancer Father        Mesothelioma   . Stomach cancer Brother   . Cancer Brother   . Cancer - Cervical Brother   . Cancer Brother   . Diabetes Brother   . Esophageal cancer Neg Hx   . Colon cancer Neg Hx   . Pancreatic cancer Neg Hx     Social History   Tobacco Use  . Smoking status: Former Smoker    Types: Cigarettes    Quit date: 08/22/1974    Years since quitting: 44.9  . Smokeless tobacco: Never Used  Substance Use Topics  . Alcohol use: No    Alcohol/week: 0.0 standard drinks  . Drug use: No    Home Medications Prior to Admission medications   Medication Sig Start Date End Date Taking? Authorizing Provider  amLODipine (NORVASC) 10 MG tablet TAKE 1 TABLET BY MOUTH EVERY DAY 07/10/19   Nafziger, Tommi Rumps, NP  atorvastatin (LIPITOR) 20 MG tablet TAKE 1 TABLET BY MOUTH EVERY DAY 07/10/19   Dorothyann Peng, NP  cloNIDine (CATAPRES) 0.2 MG tablet TAKE 1 TABLET (0.2 MG TOTAL) BY MOUTH 3 (THREE) TIMES DAILY. 05/01/19   Nafziger, Tommi Rumps, NP  clopidogrel (PLAVIX) 75 MG tablet TAKE 1 TABLET BY MOUTH EVERY DAY 05/31/19   Nafziger, Tommi Rumps, NP  doxazosin (CARDURA) 2 MG tablet Take 2 mg by mouth at bedtime.  12/28/17   [provider]  esomeprazole (NEXIUM) 40 MG capsule TAKE 1 CAPSULE BY MOUTH EVERY DAY 06/04/19   Nafziger, Tommi Rumps, NP  glipiZIDE (GLUCOTROL XL) 5 MG 24 hr tablet TAKE 1 TABLET BY MOUTH EVERY DAY 06/05/19   Nafziger, Tommi Rumps, NP  glucose blood (ONETOUCH VERIO) test strip USE TO TEST BLOOD GLUCOSE ONCE DAILY  03/08/19   Nafziger, Tommi Rumps, NP  hydrocortisone (ANUSOL-HC) 2.5 % rectal cream PLACE 1 APPLICATION RECTALLY 2 TIMES A DAY. 06/07/19   Nafziger, Tommi Rumps, NP  isosorbide-hydrALAZINE (BIDIL) 20-37.5 MG tablet Take 1 tablet by mouth 4 (four) times daily.     [provider]  lactulose (CHRONULAC) 10 GM/15ML solution TAKE 15 ML BY MOUTH 2 TIMES DAILY AS NEEDED FOR MILD CONSTIPATION. 05/28/19   Nafziger, Tommi Rumps, NP  metoprolol succinate (TOPROL-XL) 25 MG 24 hr tablet Take 4 tablets (100 mg total) by mouth daily. 03/29/19   Mariel Aloe, MD  OneTouch Delica Lancets 78G MISC USE TO CHECK BLOOD SUGAR DAILY. 04/18/19   Dorothyann Peng, NP    Allergies    Aspirin  Review of Systems   Review of Systems  Constitutional: Negative for fever.  Respiratory: Positive for cough, chest tightness, shortness of breath and wheezing.   Cardiovascular: Positive for leg swelling.  All other systems reviewed and are negative.   Physical Exam Updated Vital Signs BP (!) 207/91   Pulse 81   Temp 98.7 F (37.1 C) (Oral)   Resp 18   Ht 5\' 7"  (1.702 m)   Wt 86.2 kg   SpO2 91%   BMI 29.76 kg/m   Physical Exam Vitals and nursing note reviewed.  Constitutional:      General: He is not in acute distress.    Appearance: He is well-developed. He is not ill-appearing or diaphoretic.  HENT:     Head: Normocephalic and atraumatic.     Right Ear: External ear normal.     Left Ear: External ear normal.     Nose: Nose normal.  Eyes:     General:        Right eye: No discharge.        Left eye: No discharge.  Cardiovascular:     Rate and Rhythm: Normal rate and regular rhythm.     Heart sounds: Normal heart sounds.  Pulmonary:     Effort: Pulmonary effort is normal. No tachypnea or accessory muscle usage.     Breath sounds: Normal breath sounds.  Abdominal:     Palpations: Abdomen is soft.     Tenderness: There is no abdominal tenderness.  Musculoskeletal:     Cervical back: Neck supple.     Right lower  leg: Edema present.     Left lower leg: Edema present.     Comments: Pitting edema to lower 1/2 of lower legs/feet  Skin:    General: Skin is warm and dry.  Neurological:     Mental Status: He is alert.  Psychiatric:        Mood and Affect: Mood is not anxious.     ED Results / Procedures / Treatments   Labs (all labs ordered are listed, but only  abnormal results are displayed) Labs Reviewed  CBC WITH DIFFERENTIAL/PLATELET - Abnormal; Notable for the following components:      Result Value   RBC 3.78 (*)    Hemoglobin 10.1 (*)    HCT 31.5 (*)    All other components within normal limits  COMPREHENSIVE METABOLIC PANEL - Abnormal; Notable for the following components:   Potassium 3.4 (*)    Glucose, Bld 140 (*)    Creatinine, Ser 2.67 (*)    AST 11 (*)    GFR calc non Af Amer 21 (*)    GFR calc Af Amer 25 (*)    All other components within normal limits  BRAIN NATRIURETIC PEPTIDE - Abnormal; Notable for the following components:   B Natriuretic Peptide 1,062.0 (*)    All other components within normal limits  TROPONIN I (HIGH SENSITIVITY) - Abnormal; Notable for the following components:   Troponin I (High Sensitivity) 20 (*)    All other components within normal limits  SARS CORONAVIRUS 2 (TAT 6-24 HRS)  POC SARS CORONAVIRUS 2 AG -  ED  TROPONIN I (HIGH SENSITIVITY)    EKG EKG Interpretation  Date/Time:  Sunday August 04 2019 04:58:32 EST Ventricular Rate:  64 PR Interval:  230 QRS Duration: 90 QT Interval:  442 QTC Calculation: 455 R Axis:   -17 Text Interpretation: Sinus rhythm with 1st degree A-V block Moderate voltage criteria for LVH, may be normal variant ( R in aVL , Cornell product ) Nonspecific ST and T wave abnormality Abnormal ECG ST/T changes similar to Aug 2020 Confirmed by Sherwood Gambler [phone]) on 08/04/2019 7:36:12 AM   Radiology DG Chest 2 View  Result Date: 08/04/2019 CLINICAL DATA:  Short of breath. EXAM: CHEST - 2 VIEW COMPARISON:   03/27/2019 FINDINGS: Cardiac silhouette is enlarged. There is fine airspace disease in the RIGHT lower lobe and RIGHT upper lobe which is very similar to radiograph 03/27/2019. LEFT lung clear. No pneumothorax. No pleural fluid. IMPRESSION: Fine asymmetric airspace disease in the RIGHT upper lobe and RIGHT lower lobe not changed from 03/27/2019. Findings suggest asymmetric edema over infection. Electronically Signed   By: Suzy Bouchard M.D.   On: 08/04/2019 05:38    Procedures Procedures (including critical care time)  Medications Ordered in ED Medications  isosorbide-hydrALAZINE (BIDIL) 20-37.5 MG per tablet 1 tablet (has no administration in time range)  furosemide (LASIX) injection 40 mg (40 mg Intravenous Given 08/04/19 0920)  amLODipine (NORVASC) tablet 10 mg (10 mg Oral Given 08/04/19 0952)  cloNIDine (CATAPRES) tablet 0.2 mg (0.2 mg Oral Given 08/04/19 0951)  clopidogrel (PLAVIX) tablet 75 mg (75 mg Oral Given 08/04/19 0951)  atorvastatin (LIPITOR) tablet 20 mg (20 mg Oral Given 08/04/19 0951)  metoprolol succinate (TOPROL-XL) 24 hr tablet 100 mg (100 mg Oral Given 08/04/19 4944)    ED Course  I have reviewed the triage vital signs and the nursing notes.  Pertinent labs & imaging results that were available during my care of the patient were reviewed by me and considered in my medical decision making (see chart for details).    MDM Rules/Calculators/A&P     CHA2DS2/VAS Stroke Risk Points      N/A >= 2 Points: High Risk  1 - 1.99 Points: Medium Risk  0 Points: Low Risk    A final score could not be computed because of missing components.: Last  Change: N/A     This score determines the patient's risk of having a stroke if the  patient  has atrial fibrillation.      This score is not applicable to this patient. Components are not  calculated.                   Patient presents with what appears to be a CHF exacerbation.  His chronic kidney disease appears a little better  than last check.  He will need diuresis.  He was ambulated in the ED and felt more short of breath and his O2 sats dropped below 90%.  He has noted to have significant hypertension though he has not taken his blood pressure meds this morning so he was given his oral meds.  Discussed with hospitalist for admission.  He does have shortness of breath though my suspicion is this is not Covid.  The rapid antigen was negative and will send confirmatory test.  Marquel Pottenger was evaluated in Emergency Department on 08/04/2019 for the symptoms described in the history of present illness. He was evaluated in the context of the global COVID-19 pandemic, which necessitated consideration that the patient might be at risk for infection with the SARS-CoV-2 virus that causes COVID-19. Institutional protocols and algorithms that pertain to the evaluation of patients at risk for COVID-19 are in a state of rapid change based on information released by regulatory bodies including the CDC and federal and state organizations. These policies and algorithms were followed during the patient's care in the ED.  Final Clinical Impression(s) / ED Diagnoses Final diagnoses:  Acute on chronic congestive heart failure, unspecified heart failure type [facility])    Rx / DC Orders ED Discharge Orders    None       Sherwood Gambler, MD 08/04/19 1039

## 2019-08-04 NOTE — ED Notes (Signed)
  Pt. ambulated with steady gait, Oxygen saturation stayed between 89-91%. Pt. Complained of SOB, no complaints of dizziness or pain.

## 2019-08-04 NOTE — Consult Note (Signed)
Referring Physician: Jadene Pierini. R Nader Boys is an 84 y.o. male.                       Chief Complaint: Shortness of breath  HPI: 84 years old male with PMH of hypertension, hyperlipidemia, diastolic left heart failure, type 2 DM, prostate cancer, GERD, anemia of chronic blood loss and chronic kidney disease has 3-4 days history of exertional shortness of breath. He takes furosemide as needed but forgot to take it this week. He also had weight on chest type feeling last night and decided to come to ED. His blood work shows minimal HS-troponin I elevations, mild anemia and mild hypokalemia with creatinine of 2.67. BNP is elevated at 1062 pg. EKG shows SR with 1st degree AV block and LVH with ST-T wave abnormality. Chest x-ray shows asymmetric right upper and lower lobe edema.  Past Medical History:  Diagnosis Date  . 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 CAROTID ARTERY STENOSIS 05/10/2010  . CHF (congestive heart failure) ([place])   . DIABETES MELLITUS, TYPE II 09/19/2007  . DISEASE, CEREBROVASCULAR NEC 03/05/2007  . GERD 03/13/2007  . HYPERLIPIDEMIA 03/05/2007  . HYPERTENSION 03/05/2007  . HYPOKALEMIA 11/09/2009  . KNEE PAIN, RIGHT 11/09/2009  . PROSTATE CANCER, HX OF 03/05/2007  . RENAL DISEASE, CHRONIC 02/03/2009      Past Surgical History:  Procedure Laterality Date  . CAROTID ARTERY ANGIOPLASTY Right Oct. 10, 2001  . ESOPHAGOGASTRODUODENOSCOPY (EGD) WITH PROPOFOL N/A 11/04/2016   Procedure: ESOPHAGOGASTRODUODENOSCOPY (EGD) WITH PROPOFOL;  Surgeon: Otis Brace, MD;  Location: [facility];  Service: Gastroenterology;  Laterality: N/A;  . LAPAROSCOPIC APPENDECTOMY  02/20/2012   Procedure: APPENDECTOMY LAPAROSCOPIC;  Surgeon: Stark Klein, MD;  Location: [facility];  Service: General;  Laterality: N/A;  . PROSTATE SURGERY     prostatectomy    Family History  Problem Relation Age of Onset  . Hypertension Mother   . Cancer Father        Mesothelioma   . Stomach cancer  Brother   . Cancer Brother   . Cancer - Cervical Brother   . Cancer Brother   . Diabetes Brother   . Esophageal cancer Neg Hx   . Colon cancer Neg Hx   . Pancreatic cancer Neg Hx    Social History:  reports that he quit smoking about 44 years ago. His smoking use included cigarettes. He has never used smokeless tobacco. He reports that he does not drink alcohol or use drugs.  Allergies:  Allergies  Allergen Reactions  . Aspirin Other (See Comments)    High doses causes stomach ulcer and bleeding    (Not in a hospital admission)   Results for orders placed or performed during the hospital encounter of 08/04/19 (from the past 48 hour(s))  CBC with Differential     Status: Abnormal   Collection Time: 08/04/19  5:08 AM  Result Value Ref Range   WBC 6.1 4.0 - 10.5 K/uL   RBC 3.78 (L) 4.22 - 5.81 MIL/uL   Hemoglobin 10.1 (L) 13.0 - 17.0 g/dL   HCT 31.5 (L) 39.0 - 52.0 %   MCV 83.3 80.0 - 100.0 fL   MCH 26.7 26.0 - 34.0 pg   MCHC 32.1 30.0 - 36.0 g/dL   RDW 15.4 11.5 - 15.5 %   Platelets 259 150 - 400 K/uL   nRBC 0.0 0.0 - 0.2 %   Neutrophils Relative % 74 %   Neutro  Abs 4.6 1.7 - 7.7 K/uL   Lymphocytes Relative 15 %   Lymphs Abs 0.9 0.7 - 4.0 K/uL   Monocytes Relative 8 %   Monocytes Absolute 0.5 0.1 - 1.0 K/uL   Eosinophils Relative 2 %   Eosinophils Absolute 0.1 0.0 - 0.5 K/uL   Basophils Relative 1 %   Basophils Absolute 0.0 0.0 - 0.1 K/uL   Immature Granulocytes 0 %   Abs Immature Granulocytes 0.02 0.00 - 0.07 K/uL    Comment: Performed at [facility] [address], [place], [place] [address]  Comprehensive metabolic panel     Status: Abnormal   Collection Time: 08/04/19  5:08 AM  Result Value Ref Range   Sodium 141 135 - 145 mmol/L   Potassium 3.4 (L) 3.5 - 5.1 mmol/L   Chloride 109 98 - 111 mmol/L   CO2 22 22 - 32 mmol/L   Glucose, Bld 140 (H) 70 - 99 mg/dL   BUN 22 8 - 23 mg/dL   Creatinine, Ser 2.67 (H) 0.61 - 1.24 mg/dL   Calcium 9.0 8.9 - 10.3  mg/dL   Total Protein 6.6 6.5 - 8.1 g/dL   Albumin 3.6 3.5 - 5.0 g/dL   AST 11 (L) 15 - 41 U/L   ALT 9 0 - 44 U/L   Alkaline Phosphatase 64 38 - 126 U/L   Total Bilirubin 1.1 0.3 - 1.2 mg/dL   GFR calc non Af Amer 21 (L) >60 mL/min   GFR calc Af Amer 25 (L) >60 mL/min   Anion gap 10 5 - 15    Comment: Performed at [facility], [place] [address] [place], [place] [address]  Brain natriuretic peptide     Status: Abnormal   Collection Time: 08/04/19  5:08 AM  Result Value Ref Range   B Natriuretic Peptide 1,062.0 (H) 0.0 - 100.0 pg/mL    Comment: Performed at [facility] [address] [place], [place] [address]  Troponin I (High Sensitivity)     Status: Abnormal   Collection Time: 08/04/19  5:08 AM  Result Value Ref Range   Troponin I (High Sensitivity) 20 (H) <18 ng/L    Comment: (NOTE) Elevated high sensitivity troponin I (hsTnI) values and significant  changes across serial measurements may suggest ACS but many other  chronic and acute conditions are known to elevate hsTnI results.  Refer to the "Links" section for chest pain algorithms and additional  guidance. Performed at [facility], [place] [address] [place], [place] [address]   TSH     Status: None   Collection Time: 08/04/19  5:08 AM  Result Value Ref Range   TSH 1.171 0.350 - 4.500 uIU/mL    Comment: Performed by a 3rd Generation assay with a functional sensitivity of <=0.01 uIU/mL. Performed at [facility], [place] [address], [place], [place] [address]   Troponin I (High Sensitivity)     Status: Abnormal   Collection Time: 08/04/19  8:44 AM  Result Value Ref Range   Troponin I (High Sensitivity) 24 (H) <18 ng/L    Comment: (NOTE) Elevated high sensitivity troponin I (hsTnI) values and significant  changes across serial measurements may suggest ACS but many other  chronic and acute conditions are known to elevate hsTnI results.  Refer to the "Links" section for chest pain algorithms and  additional  guidance. Performed at [facility], [place] [address] [place], [place] [address]   POC  SARS Coronavirus 2 Ag-ED - Nasal Swab (BD Veritor Kit)     Status: None   Collection Time: 08/04/19  9:03 AM  Result Value Ref Range   SARS Coronavirus 2 Ag NEGATIVE NEGATIVE    Comment: (NOTE) SARS-CoV-2 antigen NOT DETECTED.  Negative results are presumptive.  Negative results do not preclude SARS-CoV-2 infection and should not be used as the sole basis for treatment or other patient management decisions, including infection  control decisions, particularly in the presence of clinical signs and  symptoms consistent with COVID-19, or in those who have been in contact with the virus.  Negative results must be combined with clinical observations, patient history, and epidemiological information. The expected result is Negative. Fact Sheet for Patients: PodPark.tn Fact Sheet for Healthcare Providers: GiftContent.is This test is not yet approved or cleared by the [place] FDA and  has been authorized for detection and/or diagnosis of SARS-CoV-2 by FDA under an Emergency Use Authorization (EUA).  This EUA will remain in effect (meaning this test can be used) for the duration of  the COVID-19 de claration under Section 564(b)(1) of the Act, 21 U.S.C. section 360bbb-3(b)(1), unless the authorization is terminated or revoked sooner.   CBG monitoring, ED     Status: Abnormal   Collection Time: 08/04/19 11:54 AM  Result Value Ref Range   Glucose-Capillary 119 (H) 70 - 99 mg/dL   DG Chest 2 View  Result Date: 08/04/2019 CLINICAL DATA:  Short of breath. EXAM: CHEST - 2 VIEW COMPARISON:  03/27/2019 FINDINGS: Cardiac silhouette is enlarged. There is fine airspace disease in the RIGHT lower lobe and RIGHT upper lobe which is very similar to radiograph 03/27/2019. LEFT lung clear. No pneumothorax. No pleural fluid. IMPRESSION:  Fine asymmetric airspace disease in the RIGHT upper lobe and RIGHT lower lobe not changed from 03/27/2019. Findings suggest asymmetric edema over infection. Electronically Signed   By: Suzy Bouchard M.D.   On: 08/04/2019 05:38    Review Of Systems Constitutional: No fever, chills, some weight gain. Eyes: No vision change, wears glasses. No discharge or pain. Ears: No hearing loss, No tinnitus. Respiratory: No asthma, COPD, pneumonias. Positive shortness of breath. No hemoptysis. Cardiovascular: Positive chest pain, no palpitation, positive leg edema. Gastrointestinal: No nausea, vomiting, diarrhea, positiveconstipation. No GI bleed. No hepatitis. Genitourinary: No dysuria, hematuria, kidney stone. No incontinance. Neurological: No headache, stroke, seizures.  Psychiatry: No psych facility admission for anxiety, depression, suicide. No detox. Skin: No rash. Musculoskeletal: Positive joint pain, no fibromyalgia. Positive neck pain, back pain. Lymphadenopathy: No lymphadenopathy. Hematology: Positive anemia or easy bruising.   Blood pressure (!) 155/52, pulse 60, temperature 98.7 F (37.1 C), temperature source Oral, resp. rate 11, height _0  (1.702 m), weight 86.2 kg, SpO2 97 %. Body mass index is 29.76 kg/m. General appearance: alert, cooperative, appears stated age and no distress Head: Normocephalic, atraumatic. Eyes: Brown eyes, pink conjunctiva, corneas clear. PERRL, EOM's intact. Neck: No adenopathy, no carotid bruit, Full JVD, supple, symmetrical, trachea midline and thyroid not enlarged. Resp: Basal crackles to auscultation bilaterally. Cardio: Regular rate and rhythm, S1, S2 normal, II/VI systolic murmur, no click, rub or gallop GI: Soft, non-tender; bowel sounds normal; no organomegaly. Extremities: 2 + lower leg edema, no cyanosis or clubbing. Skin: Warm and dry.  Neurologic: Alert and oriented X 3, normal strength. Normal coordination.  Assessment/Plan Acute diastolic  left heart failure, HFpEF Type 2 DM Obesity Hypertension, uncontrolled Hyperlipidemia CKD, IV Anemia of chronic disease and chronic blood loss Mild  hypokalemia.  Agree with diuresis and resuming home medications. Abnormal Troponin I due to demand ischemia. Potassium supplement.  Time spent: Review of old records, Lab, x-rays, EKG, other cardiac tests, examination, discussion with patient over 70 minutes.  Birdie Riddle, MD  08/04/2019, 1:06 PM

## 2019-08-04 NOTE — ED Triage Notes (Signed)
C/o sob onset 3 days ago states it was worse tonight , slight cough to try and clear his throat. C/o chest heaviness with deep breath.

## 2019-08-05 LAB — CBC WITH DIFFERENTIAL/PLATELET
Abs Immature Granulocytes: 0.03 10*3/uL (ref 0.00–0.07)
Basophils Absolute: 0 10*3/uL (ref 0.0–0.1)
Basophils Relative: 0 %
Eosinophils Absolute: 0 10*3/uL (ref 0.0–0.5)
Eosinophils Relative: 0 %
HCT: 30.9 % — ABNORMAL LOW (ref 39.0–52.0)
Hemoglobin: 10.2 g/dL — ABNORMAL LOW (ref 13.0–17.0)
Immature Granulocytes: 0 %
Lymphocytes Relative: 9 %
Lymphs Abs: 0.7 10*3/uL (ref 0.7–4.0)
MCH: 26.6 pg (ref 26.0–34.0)
MCHC: 33 g/dL (ref 30.0–36.0)
MCV: 80.7 fL (ref 80.0–100.0)
Monocytes Absolute: 0.6 10*3/uL (ref 0.1–1.0)
Monocytes Relative: 7 %
Neutro Abs: 6.6 10*3/uL (ref 1.7–7.7)
Neutrophils Relative %: 84 %
Platelets: 250 10*3/uL (ref 150–400)
RBC: 3.83 MIL/uL — ABNORMAL LOW (ref 4.22–5.81)
RDW: 15.1 % (ref 11.5–15.5)
WBC: 8 10*3/uL (ref 4.0–10.5)
nRBC: 0 % (ref 0.0–0.2)

## 2019-08-05 LAB — BASIC METABOLIC PANEL
Anion gap: 11 (ref 5–15)
BUN: 28 mg/dL — ABNORMAL HIGH (ref 8–23)
CO2: 23 mmol/L (ref 22–32)
Calcium: 9 mg/dL (ref 8.9–10.3)
Chloride: 107 mmol/L (ref 98–111)
Creatinine, Ser: 2.95 mg/dL — ABNORMAL HIGH (ref 0.61–1.24)
GFR calc Af Amer: 22 mL/min — ABNORMAL LOW (ref >60)
GFR calc non Af Amer: 19 mL/min — ABNORMAL LOW (ref >60)
Glucose, Bld: 137 mg/dL — ABNORMAL HIGH (ref 70–99)
Potassium: 3.1 mmol/L — ABNORMAL LOW (ref 3.5–5.1)
Sodium: 141 mmol/L (ref 135–145)

## 2019-08-05 LAB — GLUCOSE, CAPILLARY
Glucose-Capillary: 133 mg/dL — ABNORMAL HIGH (ref 70–99)
Glucose-Capillary: 133 mg/dL — ABNORMAL HIGH (ref 70–99)
Glucose-Capillary: 106 mg/dL — ABNORMAL HIGH (ref 70–99)
Glucose-Capillary: 176 mg/dL — ABNORMAL HIGH (ref 70–99)

## 2019-08-05 LAB — MAGNESIUM: Magnesium: 1.9 mg/dL (ref 1.7–2.4)

## 2019-08-05 MED ORDER — MAGNESIUM OXIDE 400 (241.3 MG) MG PO TABS
800.0000 mg | ORAL_TABLET | Freq: Once | ORAL | Status: AC
  Administered 2019-08-05: 800 mg via ORAL
  Filled 2019-08-05: qty 2

## 2019-08-05 MED ORDER — POTASSIUM CHLORIDE CRYS ER 20 MEQ PO TBCR
40.0000 meq | EXTENDED_RELEASE_TABLET | ORAL | Status: AC
  Administered 2019-08-05 (×3): 40 meq via ORAL
  Filled 2019-08-05 (×3): qty 2

## 2019-08-05 NOTE — Evaluation (Signed)
Physical Therapy Evaluation Patient Details Name: Austin [name] MRN: [id number] DOB: 11-09-1934 Today's Date: 08/05/2019   History of Present Illness  Pt is an 84 y/o male who presents with several day history of DOE and LE swelling. Covid test negative. Pt admitted for CHF exacerbation. PMH significant for renal disease, prostate CA s/p prostatectomy, HTN, DMII, CHF.  Clinical Impression  Pt admitted with above diagnosis. At the time of PT eval pt was able to perform transfers and ambulation with gross min guard assist to supervision for safety without an AD. 1 instance of LOB in room with min assist to recover. Pt with decrease in O2 sats to 83% during hallway ambulation on RA. Pt currently with functional limitations due to the deficits listed below (see PT Problem List). Pt will benefit from skilled PT to increase their independence and safety with mobility to allow discharge to the venue listed below.       Follow Up Recommendations No PT follow up;Supervision - Intermittent    Equipment Recommendations  None recommended by PT    Recommendations for Other Services       Precautions / Restrictions Precautions Precautions: Fall Precaution Comments: Watch O2 Restrictions Weight Bearing Restrictions: No      Mobility  Bed Mobility               General bed mobility comments: Pt was received sitting up in the recliner chair.   Transfers Overall transfer level: Modified independent Equipment used: None             General transfer comment: Pt was able to perform sit<>stand without assistance and without difficulty.  Ambulation/Gait Ambulation/Gait assistance: Min assist;Supervision Gait Distance (Feet): 200 Feet Assistive device: None Gait Pattern/deviations: Step-through pattern;Decreased stride length;Trunk flexed Gait velocity: Decreased Gait velocity interpretation: <1.8 ft/sec, indicate of risk for recurrent falls General Gait Details: Slow and guarded. Pt  initially had one LOB when ambulating to the bathroom, requiring min assist to recover. After that, supervision provided for safety but no other overt LOB noted.   Stairs Stairs: Yes Stairs assistance: Supervision Stair Management: One rail Left;Alternating pattern;Forwards Number of Stairs: 10 General stair comments: Pt negotiated a flight of stairs to simulate home environment. No unsteadiness noted however pt was very guarded with negotiation.   Wheelchair Mobility    Modified Rankin (Stroke Patients Only)       Balance Overall balance assessment: Needs assistance Sitting-balance support: Feet supported;No upper extremity supported Sitting balance-Leahy Scale: Fair     Standing balance support: No upper extremity supported;During functional activity Standing balance-Leahy Scale: Fair Standing balance comment: 1 instance of LOB but otherwise fair.                              Pertinent Vitals/Pain Pain Assessment: No/denies pain    Home Living Family/patient expects to be discharged to:: Private residence Living Arrangements: Spouse/significant other Available Help at Discharge: Family;Available 24 hours/day Type of Home: House Home Access: Stairs to enter Entrance Stairs-Rails: Left Entrance Stairs-Number of Steps: 4 from the garage Home Layout: Two level;Bed/bath upstairs Home Equipment: None      Prior Function Level of Independence: Independent         Comments: Hydrographic surveyor, independent with ADL's, drives, takes care of great-grandson on the weekends     Hand Dominance        Extremity/Trunk Assessment   Upper Extremity Assessment Upper Extremity Assessment: Overall WFL for tasks  assessed    Lower Extremity Assessment Lower Extremity Assessment: Overall WFL for tasks assessed    Cervical / Trunk Assessment Cervical / Trunk Assessment: Other exceptions Cervical / Trunk Exceptions: Forward head posture with rounded shoulders   Communication   Communication: No difficulties  Cognition Arousal/Alertness: Awake/alert Behavior During Therapy: WFL for tasks assessed/performed Overall Cognitive Status: Within Functional Limits for tasks assessed                                        General Comments      Exercises     Assessment/Plan    PT Assessment Patient needs continued PT services  PT Problem List Decreased activity tolerance;Decreased balance;Decreased mobility;Decreased knowledge of use of DME;Decreased safety awareness;Decreased knowledge of precautions;Cardiopulmonary status limiting activity       PT Treatment Interventions DME instruction;Gait training;Stair training;Functional mobility training;Therapeutic activities;Therapeutic exercise;Neuromuscular re-education;Patient/family education;Balance training    PT Goals (Current goals can be found in the Care Plan section)  Acute Rehab PT Goals Patient Stated Goal: Back to normal - breathing better PT Goal Formulation: With patient Time For Goal Achievement: 08/12/19 Potential to Achieve Goals: Good    Frequency Min 3X/week   Barriers to discharge        Co-evaluation               AM-PAC PT "6 Clicks" Mobility  Outcome Measure Help needed turning from your back to your side while in a flat bed without using bedrails?: None Help needed moving from lying on your back to sitting on the side of a flat bed without using bedrails?: None Help needed moving to and from a bed to a chair (including a wheelchair)?: None Help needed standing up from a chair using your arms (e.g., wheelchair or bedside chair)?: None Help needed to walk in hospital room?: A Little Help needed climbing 3-5 steps with a railing? : A Little 6 Click Score: 22    End of Session Equipment Utilized During Treatment: Gait belt Activity Tolerance: Patient tolerated treatment well Patient left: in chair;with call bell/phone within reach Nurse  Communication: Mobility status PT Visit Diagnosis: Unsteadiness on feet (R26.81)    Time: 6759-1638 PT Time Calculation (min) (ACUTE ONLY): 29 min   Charges:   PT Evaluation $PT Eval Moderate Complexity: 1 Mod PT Treatments $Gait Training: 8-22 mins        Rolinda Roan, PT, DPT Acute Rehabilitation Services Pager: [phone] Office: [phone]   Thelma Comp 08/05/2019, 10:14 AM

## 2019-08-05 NOTE — Progress Notes (Signed)
Subjective:  Patient denies any anginal chest pain states breathing has improved, leg swelling also slowly improving  Objective:  Vital Signs in the last 24 hours: Temp:  [97.8 F (36.6 C)-98.9 F (37.2 C)] 97.8 F (36.6 C) (12/14 0956) Pulse Rate:  [56-81] 64 (12/14 0956) Resp:  [13-20] 16 (12/14 0956) BP: (165-190)/(61-93) 190/69 (12/14 0956) SpO2:  [94 %-99 %] 99 % (12/14 0956) Weight:  [86.3 kg] 86.3 kg (12/14 0500)  Intake/Output from previous day: 12/13 0701 - 12/14 0700 In: 120 [P.O.:120] Out: 975 [Urine:975] Intake/Output from this shift: Total I/O In: 3 [I.V.:3] Out: -   Physical Exam: Neck: no adenopathy, no carotid bruit, no JVD and supple, symmetrical, trachea midline Lungs: Decreased breath sound at bases with faint rales noted Heart: regular rate and rhythm, S1, S2 normal and 2/6  systolic and diastolic noted soft S3 gallop noted Abdomen: soft, non-tender; bowel sounds normal; no masses,  no organomegaly Extremities: No clubbing cyanosis 2+ edema noted  Lab Results: Recent Labs    08/04/19 0508 08/05/19 0536  WBC 6.1 8.0  HGB 10.1* 10.2*  PLT 259 250   Recent Labs    08/04/19 0508 08/05/19 0536  NA 141 141  K 3.4* 3.1*  CL 109 107  CO2 22 23  GLUCOSE 140* 137*  BUN 22 28*  CREATININE 2.67* 2.95*   No results for input(s): TROPONINI in the last 72 hours.  Invalid input(s): CK, MB Hepatic Function Panel Recent Labs    08/04/19 0508  PROT 6.6  ALBUMIN 3.6  AST 11*  ALT 9  ALKPHOS 64  BILITOT 1.1   No results for input(s): CHOL in the last 72 hours. No results for input(s): PROTIME in the last 72 hours.  Imaging: Imaging results have been reviewed and DG Chest 2 View  Result Date: 08/04/2019 CLINICAL DATA:  Short of breath. EXAM: CHEST - 2 VIEW COMPARISON:  03/27/2019 FINDINGS: Cardiac silhouette is enlarged. There is fine airspace disease in the RIGHT lower lobe and RIGHT upper lobe which is very similar to radiograph 03/27/2019.  LEFT lung clear. No pneumothorax. No pleural fluid. IMPRESSION: Fine asymmetric airspace disease in the RIGHT upper lobe and RIGHT lower lobe not changed from 03/27/2019. Findings suggest asymmetric edema over infection. Electronically Signed   By: Suzy Bouchard M.D.   On: 08/04/2019 05:38    Cardiac Studies:  Assessment/Plan:  Acute diastolic left heart failure, HFpEF Type 2 DM Obesity Hypertension, uncontrolled Hyperlipidemia CKD, IV Anemia of chronic disease and chronic blood loss Mild hypokalemia. Plan Agree with IV Lasix diuresis Replace K Monitor renal function Restrict fluid to 1 L per 24 hours Daily weights Strict INO's  LOS: 1 day    Austin [name] 08/05/2019, 11:58 AM

## 2019-08-05 NOTE — Progress Notes (Signed)
PROGRESS NOTE    Austin [name]  HBZ:[id number] DOB: 09-08-1934 DOA: 08/04/2019 PCP: Dorothyann Peng, NP   Brief Narrative:  84 year old with history of HLD, HTN, diastolic CHF EF 55 to 10%, DM2, anemia of chronic disease, CKD stage IV, tobacco use, GERD, prostate cancer presented to the hospital with progressive shortness of breath, hypertensive urgency and some hypoxia.  Evidence of fluid overload admitted for acute CHF exacerbation.   Assessment & Plan:   Principal Problem:   Acute exacerbation of CHF (congestive heart failure) (HCC) Active Problems:   Diabetes mellitus with renal complications (HCC)   Essential hypertension   GERD   Hypertensive urgency   CKD (chronic kidney disease), stage IV (HCC)   Elevated troponin   Hypokalemia  Acute acute exacerbation of congestive heart failure, preserved EF 55% -Daily input and output, daily weight, fluid restriction - Lasix IV ordered-monitor urine output and the response -Echo 03/2019-EF 55 to 60% -EKG shows= no acute ST-T changes -Chest x-ray shows= pulmonary edema, asymmetric - Trend cardiac enzymes-some demand ischemia - Closely monitor potassium and magnesium during aggressive diuresis.  Replete as necessary.  Keep potassium above 4 and magnesium above 2 -Continue other cardiac medications with caution -Supplemental oxygen as necessary -Seen by cardiology  Hypertensive urgency -Resume home regimen-Norvasc 10 mg daily, clonidine 0.2 mg 3 times daily, Toprol-XL 100 mg daily.  IV hydralazine  Elevated troponin -Demand ischemia in the setting of fluid overload.  EKG is nonischemic  Hypokalemia -Aggressive repletion  CKD stage IV -Monitor renal function in the setting of diuresis  Anemia of chronic disease -Hemoglobin around baseline of 10  Diabetes mellitus type 2, controlled -Hemoglobin A1c 7.2 -Holding p.o. medication.  Insulin sliding scale and Accu-Chek  Hyperlipidemia Statin  GERD -PPI  Carotid artery  stenosis -On Plavix and statin  DVT prophylaxis: Subcu heparin Code Status: Full code Family Communication:   Disposition Plan: Maintain hospital stay for IV diuretics  Consultants:   Cardiology, Dr. Wardell Honour  Procedures:   None  Antimicrobials:   None   Subjective: Symptomatically feels better compared to yesterday but still having some exertional dyspnea.  With ambulation his oxygen saturation drops down to 83% on room air.  Review of Systems Otherwise negative except as per HPI, including: General: Denies fever, chills, night sweats or unintended weight loss. Resp: Denies cough Cardiac: Denies chest pain, palpitations, orthopnea, paroxysmal nocturnal dyspnea. GI: Denies abdominal pain, nausea, vomiting, diarrhea or constipation GU: Denies dysuria, frequency, hesitancy or incontinence MS: Denies muscle aches, joint pain or swelling Neuro: Denies headache, neurologic deficits (focal weakness, numbness, tingling), abnormal gait Psych: Denies anxiety, depression, SI/HI/AVH Skin: Denies new rashes or lesions ID: Denies sick contacts, exotic exposures, travel  Objective: Vitals:   08/04/19 1545 08/04/19 1625 08/05/19 0023 08/05/19 0500  BP:  (!) 177/93 (!) 165/63   Pulse: (!) 56 81 62   Resp: 13 20 16    Temp:  98.3 F (36.8 C) 98.9 F (37.2 C)   TempSrc:  Oral Oral   SpO2: 94% 95% 96%   Weight:    86.3 kg  Height:        Intake/Output Summary (Last 24 hours) at 08/05/2019 0857 Last data filed at 08/05/2019 0700 Gross per 24 hour  Intake 120 ml  Output 975 ml  Net -855 ml   Filed Weights   08/04/19 0451 08/05/19 0500  Weight: 86.2 kg 86.3 kg    Examination:  General exam: Appears calm and comfortable  Respiratory system: Bibasilar crackles Cardiovascular system: S1 &  S2 heard, RRR. No JVD, murmurs, rubs, gallops or clicks. No pedal edema. Gastrointestinal system: Abdomen is nondistended, soft and nontender. No organomegaly or masses felt. Normal  bowel sounds heard. Central nervous system: Alert and oriented. No focal neurological deficits. Extremities: Symmetric 5 x 5 power. Skin: No rashes, lesions or ulcers Psychiatry: Judgement and insight appear normal. Mood & affect appropriate.     Data Reviewed:   CBC: Recent Labs  Lab 08/04/19 0508 08/05/19 0536  WBC 6.1 8.0  NEUTROABS 4.6 6.6  HGB 10.1* 10.2*  HCT 31.5* 30.9*  MCV 83.3 80.7  PLT 259 921   Basic Metabolic Panel: Recent Labs  Lab 08/04/19 0508 08/05/19 0536  NA 141 141  K 3.4* 3.1*  CL 109 107  CO2 22 23  GLUCOSE 140* 137*  BUN 22 28*  CREATININE 2.67* 2.95*  CALCIUM 9.0 9.0  MG  --  1.9   GFR: Estimated Creatinine Clearance: 20.3 mL/min (A) (by C-G formula based on SCr of 2.95 mg/dL (H)). Liver Function Tests: Recent Labs  Lab 08/04/19 0508  AST 11*  ALT 9  ALKPHOS 64  BILITOT 1.1  PROT 6.6  ALBUMIN 3.6   No results for input(s): LIPASE, AMYLASE in the last 168 hours. No results for input(s): AMMONIA in the last 168 hours. Coagulation Profile: No results for input(s): INR, PROTIME in the last 168 hours. Cardiac Enzymes: No results for input(s): CKTOTAL, CKMB, CKMBINDEX, TROPONINI in the last 168 hours. BNP (last 3 results) No results for input(s): PROBNP in the last 8760 hours. HbA1C: Recent Labs    08/04/19 1645  HGBA1C 7.2*   CBG: Recent Labs  Lab 08/04/19 1154 08/04/19 1632 08/04/19 2201 08/05/19 0532  GLUCAP 119* 136* 172* 133*   Lipid Profile: No results for input(s): CHOL, HDL, LDLCALC, TRIG, CHOLHDL, LDLDIRECT in the last 72 hours. Thyroid Function Tests: Recent Labs    08/04/19 0508  TSH 1.171   Anemia Panel: No results for input(s): VITAMINB12, FOLATE, FERRITIN, TIBC, IRON, RETICCTPCT in the last 72 hours. Sepsis Labs: No results for input(s): PROCALCITON, LATICACIDVEN in the last 168 hours.  Recent Results (from the past 240 hour(s))  SARS CORONAVIRUS 2 (TAT 6-24 HRS) Nasopharyngeal Nasopharyngeal Swab      Status: None   Collection Time: 08/04/19 10:54 AM   Specimen: Nasopharyngeal Swab  Result Value Ref Range Status   SARS Coronavirus 2 NEGATIVE NEGATIVE Final    Comment: (NOTE) SARS-CoV-2 target nucleic acids are NOT DETECTED. The SARS-CoV-2 RNA is generally detectable in upper and lower respiratory specimens during the acute phase of infection. Negative results do not preclude SARS-CoV-2 infection, do not rule out co-infections with other pathogens, and should not be used as the sole basis for treatment or other patient management decisions. Negative results must be combined with clinical observations, patient history, and epidemiological information. The expected result is Negative. Fact Sheet for Patients: SugarRoll.be Fact Sheet for Healthcare Providers: https://www.woods-mathews.com/ This test is not yet approved or cleared by the [place] FDA and  has been authorized for detection and/or diagnosis of SARS-CoV-2 by FDA under an Emergency Use Authorization (EUA). This EUA will remain  in effect (meaning this test can be used) for the duration of the COVID-19 declaration under Section 56 4(b)(1) of the Act, 21 U.S.C. section 360bbb-3(b)(1), unless the authorization is terminated or revoked sooner. Performed at [facility], [place] [address] [place], [place] [address]          Radiology Studies: DG Chest  2 View  Result Date: 08/04/2019 CLINICAL DATA:  Short of breath. EXAM: CHEST - 2 VIEW COMPARISON:  03/27/2019 FINDINGS: Cardiac silhouette is enlarged. There is fine airspace disease in the RIGHT lower lobe and RIGHT upper lobe which is very similar to radiograph 03/27/2019. LEFT lung clear. No pneumothorax. No pleural fluid. IMPRESSION: Fine asymmetric airspace disease in the RIGHT upper lobe and RIGHT lower lobe not changed from 03/27/2019. Findings suggest asymmetric edema over infection. Electronically Signed   By:  Suzy Bouchard M.D.   On: 08/04/2019 05:38        Scheduled Meds: . amLODipine  10 mg Oral Daily  . atorvastatin  20 mg Oral q1800  . cloNIDine  0.2 mg Oral TID  . clopidogrel  75 mg Oral Daily  . furosemide  40 mg Intravenous BID  . heparin  5,000 Units Subcutaneous Q8H  . insulin aspart  0-6 Units Subcutaneous TID WC  . metoprolol succinate  100 mg Oral Daily  . pantoprazole  80 mg Oral Q1200  . sodium chloride flush  3 mL Intravenous Q12H   Continuous Infusions: . sodium chloride       LOS: 1 day   Time spent= 35 mins    Austin [name] Arsenio Loader, MD [facility]  If 7PM-7AM, please contact night-coverage  08/05/2019, 8:57 AM

## 2019-08-05 NOTE — Evaluation (Signed)
Occupational Therapy Evaluation and Discharge Patient Details Name: Nechemia Chiappetta MRN: [id number] DOB: 1935-08-20 Today's Date: 08/05/2019    History of Present Illness Pt is an 84 y/o male who presents with several day history of DOE and LE swelling. Covid test negative. Pt admitted for CHF exacerbation. PMH significant for renal disease, prostate CA s/p prostatectomy, HTN, DMII, CHF.   Clinical Impression   This 84 yo male admitted with above presents to acute OT with drop in O2 sats on RA and decreased endurance for activity compared to his baseline due to increased fluid. Education completed with patient and wife, acute OT will sign off.     Follow Up Recommendations  No OT follow up;Supervision - Intermittent    Equipment Recommendations  None recommended by OT       Precautions / Restrictions Precautions Precautions: Fall Precaution Comments: Watch O2 (especially if trying on RA) Restrictions Weight Bearing Restrictions: No      Mobility Bed Mobility               General bed mobility comments: Pt was received sitting up in the recliner chair.   Transfers Overall transfer level: Independent Equipment used: None                  Balance Overall balance assessment: No apparent balance deficits (not formally assessed)                                         ADL either performed or assessed with clinical judgement   ADL                                         General ADL Comments: Overall at a S level. Did educate patient and wife that patient may benefit from a shower seat for safety and energy conservation (if they decided to get one they can get one at any medical supply store or order online), try to not take hot/steamy showers--keep bathroom door open while showering--have vent fan running if have one--wear O2 (all of this will help his breathing). We talked about managing his O2 tubing at home and I had him practice  with his O2 tubing in room (3 connected) as he went to bathroom and back to recliner (he did great with this). Educated on use of inspirometer (pt did mildly struggle with this). Also educated on purse lipped breathing which he did well with.     Vision Patient Visual Report: No change from baseline              Pertinent Vitals/Pain Pain Assessment: No/denies pain     Hand Dominance Right   Extremity/Trunk Assessment Upper Extremity Assessment Upper Extremity Assessment: Overall WFL for tasks assessed           Communication Communication Communication: No difficulties   Cognition Arousal/Alertness: Awake/alert Behavior During Therapy: WFL for tasks assessed/performed Overall Cognitive Status: Within Functional Limits for tasks assessed                                                Home Living Family/patient expects to be discharged to:: Private residence Living Arrangements: Spouse/significant  other Available Help at Discharge: Family;Available 24 hours/day Type of Home: House Home Access: Stairs to enter [employer] of Steps: 4 from the garage Entrance Stairs-Rails: Left Home Layout: Two level;Bed/bath upstairs Alternate Level Stairs-Number of Steps: 12 Alternate Level Stairs-Rails: None Bathroom Shower/Tub: Tub/shower unit;Curtain   Biochemist, clinical: Standard     Home Equipment: None          Prior Functioning/Environment Level of Independence: Independent        Comments: Hydrographic surveyor, independent with ADL's, drives, takes care of great-grandson on the weekends        OT Problem List: Cardiopulmonary status limiting activity;Decreased activity tolerance         OT Goals(Current goals can be found in the care plan section) Acute Rehab OT Goals Patient Stated Goal: to go home soon  OT Frequency:                AM-PAC OT "6 Clicks" Daily Activity     Outcome Measure Help from another person eating  meals?: None Help from another person taking care of personal grooming?: None Help from another person toileting, which includes using toliet, bedpan, or urinal?: None Help from another person bathing (including washing, rinsing, drying)?: None Help from another person to put on and taking off regular upper body clothing?: None Help from another person to put on and taking off regular lower body clothing?: None 6 Click Score: 24   End of Session Equipment Utilized During Treatment: Oxygen(2 liters) Nurse Communication: (no alarm-RN OK with this, please reinforce use of inspirometer)  Activity Tolerance: Patient tolerated treatment well Patient left: in chair;with call bell/phone within reach;with family/visitor present  OT Visit Diagnosis: Other (comment)(decreased activity tolerance)                Time: 8676-7209 OT Time Calculation (min): 34 min Charges:  OT General Charges $OT Visit: 1 Visit OT Evaluation $OT Eval Moderate Complexity: 1 Mod OT Treatments $Self Care/Home Management : 8-22 mins  Tye [place] , OTR/L Acute Rehab Services Pager [phone] Office [phone]     08/05/2019, 4:48 PM

## 2019-08-05 NOTE — Progress Notes (Signed)
SATURATION QUALIFICATIONS: (This note is used to comply with regulatory documentation for home oxygen)  Patient Saturations on Room Air at Rest = 94%  Patient Saturations on Room Air while Ambulating = 83%  Patient Saturations on supplemental oxygen while Ambulating = Not tested  Please briefly explain why patient needs home oxygen: Pt not on supplemental O2 at baseline/prior to admission. Today, this patient could not maintain O2 sats >83% during hallway ambulation on RA.   Rolinda Roan, PT, DPT Acute Rehabilitation Services Pager: [phone] Office: [phone]

## 2019-08-06 LAB — CBC
HCT: 30.2 % — ABNORMAL LOW (ref 39.0–52.0)
Hemoglobin: 10 g/dL — ABNORMAL LOW (ref 13.0–17.0)
MCH: 26.8 pg (ref 26.0–34.0)
MCHC: 33.1 g/dL (ref 30.0–36.0)
MCV: 81 fL (ref 80.0–100.0)
Platelets: 218 10*3/uL (ref 150–400)
RBC: 3.73 MIL/uL — ABNORMAL LOW (ref 4.22–5.81)
RDW: 14.9 % (ref 11.5–15.5)
WBC: 6.9 10*3/uL (ref 4.0–10.5)
nRBC: 0 % (ref 0.0–0.2)

## 2019-08-06 LAB — BASIC METABOLIC PANEL
Anion gap: 9 (ref 5–15)
BUN: 29 mg/dL — ABNORMAL HIGH (ref 8–23)
CO2: 24 mmol/L (ref 22–32)
Calcium: 8.8 mg/dL — ABNORMAL LOW (ref 8.9–10.3)
Chloride: 108 mmol/L (ref 98–111)
Creatinine, Ser: 3 mg/dL — ABNORMAL HIGH (ref 0.61–1.24)
GFR calc Af Amer: 21 mL/min — ABNORMAL LOW (ref >60)
GFR calc non Af Amer: 18 mL/min — ABNORMAL LOW (ref >60)
Glucose, Bld: 139 mg/dL — ABNORMAL HIGH (ref 70–99)
Potassium: 3.6 mmol/L (ref 3.5–5.1)
Sodium: 141 mmol/L (ref 135–145)

## 2019-08-06 LAB — GLUCOSE, CAPILLARY
Glucose-Capillary: 129 mg/dL — ABNORMAL HIGH (ref 70–99)
Glucose-Capillary: 135 mg/dL — ABNORMAL HIGH (ref 70–99)

## 2019-08-06 LAB — MAGNESIUM: Magnesium: 1.8 mg/dL (ref 1.7–2.4)

## 2019-08-06 MED ORDER — HYDRALAZINE HCL 25 MG PO TABS
25.0000 mg | ORAL_TABLET | Freq: Three times a day (TID) | ORAL | Status: DC
  Administered 2019-08-06: 25 mg via ORAL
  Filled 2019-08-06: qty 1

## 2019-08-06 MED ORDER — POTASSIUM CHLORIDE CRYS ER 20 MEQ PO TBCR
40.0000 meq | EXTENDED_RELEASE_TABLET | Freq: Once | ORAL | Status: AC
  Administered 2019-08-06: 40 meq via ORAL
  Filled 2019-08-06: qty 2

## 2019-08-06 MED ORDER — FUROSEMIDE 40 MG PO TABS: 40.0000 mg | ORAL_TABLET | Freq: Every day | ORAL | 0 refills | Status: DC | PRN

## 2019-08-06 MED ORDER — MAGNESIUM OXIDE 400 (241.3 MG) MG PO TABS
800.0000 mg | ORAL_TABLET | Freq: Once | ORAL | Status: AC
  Administered 2019-08-06: 800 mg via ORAL
  Filled 2019-08-06: qty 2

## 2019-08-06 NOTE — TOC Transition Note (Signed)
Transition of Care [facility]) - CM/SW Discharge Note   Patient Details  Name: Austin [name] MRN: [id number] Date of Birth: Jul 30, 1935  [facility]) CM/SW Contact:  Marilu Favre, RN Phone Number: 08/06/2019, 10:52 AM   Clinical Narrative:     Recommended face sheet information with patient. Patient from home with wife. Ambulating on room air saturation was 94% , explained he does not qualify for home oxygen. Patient voiced understanding.  Final next level of care: Home/Self Care Barriers to Discharge: No Barriers Identified   Patient Goals and CMS Choice Patient states their goals for this hospitalization and ongoing recovery are:: to go home CMS Medicare.gov Compare Post Acute Care list provided to:: Patient Choice offered to / list presented to : Patient  Discharge Placement                       Discharge Plan and Services                DME Arranged: N/A         HH Arranged: NA          Social Determinants of Health (SDOH) Interventions     Readmission Risk Interventions No flowsheet data found.

## 2019-08-06 NOTE — Progress Notes (Signed)
RN ambulated patient around the whole unit on room air. Pt's lowest oxygen saturation was 94%. PT tolerated well and returned to his room.

## 2019-08-06 NOTE — Progress Notes (Signed)
Subjective:  Patient denies any chest pain or shortness of breath states overall feels better and eager and ready to be discharged.  Objective:  Vital Signs in the last 24 hours: Temp:  [98.6 F (37 C)-99 F (37.2 C)] 98.7 F (37.1 C) (12/15 0521) Pulse Rate:  [61-65] 65 (12/15 0900) Resp:  [16-18] 16 (12/15 0521) BP: (167-174)/(64-73) 173/73 (12/15 0900) SpO2:  [96 %-99 %] 99 % (12/15 0521) Weight:  [87.3 kg] 87.3 kg (12/15 0500)  Intake/Output from previous day: 12/14 0701 - 12/15 0700 In: 603 [P.O.:600; I.V.:3] Out: 1050 [Urine:1050] Intake/Output from this shift: Total I/O In: 120 [P.O.:120] Out: -   Physical Exam: Neck: no adenopathy, no carotid bruit, no JVD and supple, symmetrical, trachea midline Lungs: clear to auscultation bilaterally Heart: regular rate and rhythm, S1, S2 normal and 2/6 systolic and diastolic murmur noted Abdomen: soft, non-tender; bowel sounds normal; no masses,  no organomegaly Extremities: No clubbing cyanosis 1+ edema noted  Lab Results: Recent Labs    08/05/19 0536 08/06/19 0543  WBC 8.0 6.9  HGB 10.2* 10.0*  PLT 250 218   Recent Labs    08/05/19 0536 08/06/19 0543  NA 141 141  K 3.1* 3.6  CL 107 108  CO2 23 24  GLUCOSE 137* 139*  BUN 28* 29*  CREATININE 2.95* 3.00*   No results for input(s): TROPONINI in the last 72 hours.  Invalid input(s): CK, MB Hepatic Function Panel Recent Labs    08/04/19 0508  PROT 6.6  ALBUMIN 3.6  AST 11*  ALT 9  ALKPHOS 64  BILITOT 1.1   No results for input(s): CHOL in the last 72 hours. No results for input(s): PROTIME in the last 72 hours.  Imaging: Imaging results have been reviewed and No results found.  Cardiac Studies:  Assessment/Plan:  Compensated acute on chronic diastolic left heart failure, HFpEF Type 2 DM Obesity Hypertension, uncontrolled Hyperlipidemia CKD, IV Anemia of chronic disease and chronic blood loss Plan Agree with present management I will sign off  follow-up with me in 1 to 2 weeks Heart failure instructions have been given. Follow-up with renal service as scheduled  LOS: 2 days    Charolette Forward 08/06/2019, 12:16 PM

## 2019-08-06 NOTE — Progress Notes (Signed)
NURSING PROGRESS NOTE  Jaremy Nosal [id number] Discharge Data: 08/06/2019 12:29 PM Attending Provider: Norval Morton, MD GMW:[id number], Tommi Rumps, NP     Norm Salt to be D/C'd Home per MD order.  Discussed with the patient the After Visit Summary and all questions fully answered. All IV's discontinued with no bleeding noted. All belongings returned to patient for patient to take home.   Last Vital Signs:  Blood pressure (!) 173/73, pulse 65, temperature 98.7 F (37.1 C), temperature source Oral, resp. rate 16, height 5\' 7"  (1.702 m), weight 87.3 kg, SpO2 99 %.  Discharge Medication List Allergies as of 08/06/2019      Reactions   Aspirin Other (See Comments)   High doses causes stomach ulcer and bleeding      Medication List    TAKE these medications   acetaminophen 325 MG tablet Commonly known as: TYLENOL Take 650 mg by mouth every 6 (six) hours as needed for mild pain or moderate pain.   amLODipine 10 MG tablet Commonly known as: NORVASC TAKE 1 TABLET BY MOUTH EVERY DAY Notes to patient: 08/07/2019   atorvastatin 20 MG tablet Commonly known as: LIPITOR TAKE 1 TABLET BY MOUTH EVERY DAY What changed: when to take this Notes to patient: 08/06/2019   cloNIDine 0.2 MG tablet Commonly known as: CATAPRES TAKE 1 TABLET (0.2 MG TOTAL) BY MOUTH 3 (THREE) TIMES DAILY. Notes to patient: 08/06/2019   clopidogrel 75 MG tablet Commonly known as: PLAVIX TAKE 1 TABLET BY MOUTH EVERY DAY Notes to patient: 08/07/2019   [employer] D3 25 MCG (1000 UT) capsule Generic drug: Cholecalciferol Take 1,000 Units by mouth daily. Notes to patient: 08/07/2019   doxazosin 2 MG tablet Commonly known as: CARDURA Take 2 mg by mouth at bedtime. Notes to patient: 08/06/2019   esomeprazole 40 MG capsule Commonly known as: [place] 1 CAPSULE BY MOUTH EVERY DAY What changed:   how much to take  how to take this  when to take this  additional instructions Notes to patient: 08/06/2019    furosemide 40 MG tablet Commonly known as: Lasix Take 1 tablet (40 mg total) by mouth daily as needed for fluid or edema.   glipiZIDE 5 MG 24 hr tablet Commonly known as: GLUCOTROL XL TAKE 1 TABLET BY MOUTH EVERY DAY What changed: when to take this Notes to patient: 08/06/2019   hydrocortisone 2.5 % rectal cream Commonly known as: ANUSOL-HC PLACE 1 APPLICATION RECTALLY 2 TIMES A DAY. What changed: See the new instructions. Notes to patient: 08/06/2019   isosorbide-hydrALAZINE 20-37.5 MG tablet Commonly known as: BIDIL Take 1 tablet by mouth 4 (four) times daily. Notes to patient: 08/06/2019   Klor-Con M10 10 MEQ tablet Generic drug: potassium chloride Take 10 mEq by mouth daily. Notes to patient: 08/07/2019   lactulose 10 GM/15ML solution Commonly known as: CHRONULAC TAKE 15 ML BY MOUTH 2 TIMES DAILY AS NEEDED FOR MILD CONSTIPATION. What changed: See the new instructions.   losartan 25 MG tablet Commonly known as: COZAAR Take 25 mg by mouth daily. Notes to patient: 08/06/2019   metoprolol succinate 25 MG 24 hr tablet Commonly known as: TOPROL-XL Take 4 tablets (100 mg total) by mouth daily. Notes to patient: 64/40/3474   OneTouch Delica Lancets 25Z Misc USE TO CHECK BLOOD SUGAR DAILY. Notes to patient: Continue home schedule    OneTouch Verio test strip Generic drug: glucose blood USE TO TEST BLOOD GLUCOSE ONCE DAILY Notes to patient: Continue home schedule    triamcinolone cream  0.1 % Commonly known as: KENALOG Apply 1 application topically 2 (two) times daily. Notes to patient: 08/06/2019

## 2019-08-06 NOTE — Discharge Summary (Signed)
Physician Discharge Summary  Austin [name] [id number] DOB: February 08, 1935 DOA: 08/04/2019  PCP: Dorothyann Peng, NP  Admit date: 08/04/2019 Discharge date: 08/06/2019  Admitted From: Home Disposition: Home  Recommendations for Outpatient Follow-up:  1. Follow up with PCP in 1-2 weeks 2. Please obtain BMP/CBC in one week your next doctors visit.  3. Lasix as needed prescribed  Home Health: None Equipment/Devices: None Discharge Condition: Stable CODE STATUS: Full code Diet recommendation: Cardiac  Brief/Interim Summary: 84 year old with history of HLD, HTN, diastolic CHF EF 55 to 09%, DM2, anemia of chronic disease, CKD stage IV, tobacco use, GERD, prostate cancer presented to the hospital with progressive shortness of breath, hypertensive urgency and some hypoxia.  Evidence of fluid overload admitted for acute CHF exacerbation.  Over the course of 24 hours he was aggressively diuresed and improved at much faster rate than expected.  First day of admission he was hypoxic with ambulation but the following day he was saturating greater than 90% on room air with ambulation.  Symptomatically felt much better and back to himself. Seems stable to be discharged.  Will prescribe Lasix to be taken as needed for fluid overload and edema at home.  He has been instructed.   Discharge Diagnoses:  Principal Problem:   Acute exacerbation of CHF (congestive heart failure) (HCC) Active Problems:   Diabetes mellitus with renal complications (HCC)   Essential hypertension   GERD   Hypertensive urgency   CKD (chronic kidney disease), stage IV (HCC)   Elevated troponin   Hypokalemia  Acute acute exacerbation of congestive heart failure, preserved EF 55%, class II -Symptomatically feels great back to baseline.  No longer hypoxic even with ambulation -Echo 03/2019-EF 55 to 60% -EKG shows= no acute ST-T changes -Chest x-ray shows= pulmonary edema, asymmetric - Trend cardiac enzymes-some demand  ischemia - Will discharge him today and have him take Lasix as needed at home.  Advised him if he has to take it too frequently then notify his primary care provider or the cardiologist so it can be managed appropriately.  Hypertensive urgency, improved -Resume home medications.  2 g salt diet.  Closely monitor and log blood pressures at home so it can be managed appropriately outpatient  Elevated troponin -Demand ischemia in the setting of fluid overload.  EKG is nonischemic  Hypokalemia -Aprn repletion  CKD stage IV -Monitor renal function in the setting of diuresis  Anemia of chronic disease -Hemoglobin around baseline of 10  Diabetes mellitus type 2, controlled -Hemoglobin A1c 7.2 -resume home meds   Hyperlipidemia Statin  GERD -PPI  Carotid artery stenosis -On Plavix and statin  Consultations:  Cardiology  Subjective: Doing great, wishes to go home. Ambulating without hypoxia.   Discharge Exam: Vitals:   08/06/19 0521 08/06/19 0900  BP:  (!) 173/73  Pulse: 61 65  Resp: 16   Temp: 98.7 F (37.1 C)   SpO2: 99%    Vitals:   08/05/19 2325 08/06/19 0500 08/06/19 0521 08/06/19 0900  BP: (!) 167/66   (!) 173/73  Pulse: 63  61 65  Resp: 18  16   Temp: 99 F (37.2 C)  98.7 F (37.1 C)   TempSrc: Oral  Oral   SpO2: 98%  99%   Weight:  87.3 kg    Height:        General: Pt is alert, awake, not in acute distress Cardiovascular: RRR, S1/S2 +, no rubs, no gallops Respiratory: CTA bilaterally, no wheezing, no rhonchi Abdominal: Soft, NT, ND, bowel sounds +  Extremities: no edema, no cyanosis  Discharge Instructions   Allergies as of 08/06/2019      Reactions   Aspirin Other (See Comments)   High doses causes stomach ulcer and bleeding      Medication List    TAKE these medications   acetaminophen 325 MG tablet Commonly known as: TYLENOL Take 650 mg by mouth every 6 (six) hours as needed for mild pain or moderate pain.   amLODipine 10 MG  tablet Commonly known as: NORVASC TAKE 1 TABLET BY MOUTH EVERY DAY   atorvastatin 20 MG tablet Commonly known as: LIPITOR TAKE 1 TABLET BY MOUTH EVERY DAY What changed: when to take this   cloNIDine 0.2 MG tablet Commonly known as: CATAPRES TAKE 1 TABLET (0.2 MG TOTAL) BY MOUTH 3 (THREE) TIMES DAILY.   clopidogrel 75 MG tablet Commonly known as: PLAVIX TAKE 1 TABLET BY MOUTH EVERY DAY   [employer] D3 25 MCG (1000 UT) capsule Generic drug: Cholecalciferol Take 1,000 Units by mouth daily.   doxazosin 2 MG tablet Commonly known as: CARDURA Take 2 mg by mouth at bedtime.   esomeprazole 40 MG capsule Commonly known as: NEXIUM TAKE 1 CAPSULE BY MOUTH EVERY DAY What changed:   how much to take  how to take this  when to take this  additional instructions   furosemide 40 MG tablet Commonly known as: Lasix Take 1 tablet (40 mg total) by mouth daily as needed for fluid or edema.   glipiZIDE 5 MG 24 hr tablet Commonly known as: GLUCOTROL XL TAKE 1 TABLET BY MOUTH EVERY DAY What changed: when to take this   hydrocortisone 2.5 % rectal cream Commonly known as: ANUSOL-HC PLACE 1 APPLICATION RECTALLY 2 TIMES A DAY. What changed: See the new instructions.   isosorbide-hydrALAZINE 20-37.5 MG tablet Commonly known as: BIDIL Take 1 tablet by mouth 4 (four) times daily.   Klor-Con M10 10 MEQ tablet Generic drug: potassium chloride Take 10 mEq by mouth daily.   lactulose 10 GM/15ML solution Commonly known as: CHRONULAC TAKE 15 ML BY MOUTH 2 TIMES DAILY AS NEEDED FOR MILD CONSTIPATION. What changed: See the new instructions.   losartan 25 MG tablet Commonly known as: COZAAR Take 25 mg by mouth daily.   metoprolol succinate 25 MG 24 hr tablet Commonly known as: TOPROL-XL Take 4 tablets (100 mg total) by mouth daily.   OneTouch Delica Lancets 01U Misc USE TO CHECK BLOOD SUGAR DAILY.   OneTouch Verio test strip Generic drug: glucose blood USE TO TEST BLOOD GLUCOSE ONCE  DAILY   triamcinolone cream 0.1 % Commonly known as: KENALOG Apply 1 application topically 2 (two) times daily.      Follow-up Information    Dorothyann Peng, NP. Schedule an appointment as soon as possible for a visit in 1 week(s).   Specialty: Family Medicine Contact information: [address] [place] [address] [phone]        Charolette Forward, MD. Schedule an appointment as soon as possible for a visit in 3 week(s).   Specialty: Cardiology Contact information: [place] [address] [phone]          Allergies  Allergen Reactions  . Aspirin Other (See Comments)    High doses causes stomach ulcer and bleeding    You were cared for by a hospitalist during your hospital stay. If you have any questions about your discharge medications or the care you received while you were in the hospital after  you are discharged, you can call the unit and asked to speak with the hospitalist on call if the hospitalist that took care of you is not available. Once you are discharged, your primary care physician will handle any further medical issues. Please note that no refills for any discharge medications will be authorized once you are discharged, as it is imperative that you return to your primary care physician (or establish a relationship with a primary care physician if you do not have one) for your aftercare needs so that they can reassess your need for medications and monitor your lab values.   Procedures/Studies: DG Chest 2 View  Result Date: 08/04/2019 CLINICAL DATA:  Short of breath. EXAM: CHEST - 2 VIEW COMPARISON:  03/27/2019 FINDINGS: Cardiac silhouette is enlarged. There is fine airspace disease in the RIGHT lower lobe and RIGHT upper lobe which is very similar to radiograph 03/27/2019. LEFT lung clear. No pneumothorax. No pleural fluid. IMPRESSION: Fine asymmetric airspace disease in the RIGHT upper lobe and RIGHT lower lobe not  changed from 03/27/2019. Findings suggest asymmetric edema over infection. Electronically Signed   By: Suzy Bouchard M.D.   On: 08/04/2019 05:38      The results of significant diagnostics from this hospitalization (including imaging, microbiology, ancillary and laboratory) are listed below for reference.     Microbiology: Recent Results (from the past 240 hour(s))  SARS CORONAVIRUS 2 (TAT 6-24 HRS) Nasopharyngeal Nasopharyngeal Swab     Status: None   Collection Time: 08/04/19 10:54 AM   Specimen: Nasopharyngeal Swab  Result Value Ref Range Status   SARS Coronavirus 2 NEGATIVE NEGATIVE Final    Comment: (NOTE) SARS-CoV-2 target nucleic acids are NOT DETECTED. The SARS-CoV-2 RNA is generally detectable in upper and lower respiratory specimens during the acute phase of infection. Negative results do not preclude SARS-CoV-2 infection, do not rule out co-infections with other pathogens, and should not be used as the sole basis for treatment or other patient management decisions. Negative results must be combined with clinical observations, patient history, and epidemiological information. The expected result is Negative. Fact Sheet for Patients: SugarRoll.be Fact Sheet for Healthcare Providers: https://www.woods-mathews.com/ This test is not yet approved or cleared by the [place] FDA and  has been authorized for detection and/or diagnosis of SARS-CoV-2 by FDA under an Emergency Use Authorization (EUA). This EUA will remain  in effect (meaning this test can be used) for the duration of the COVID-19 declaration under Section 56 4(b)(1) of the Act, 21 U.S.C. section 360bbb-3(b)(1), unless the authorization is terminated or revoked sooner. Performed at [facility], [place] [address] [place], [place] [address]      Labs: BNP (last 3 results) Recent Labs    01/14/19 1315 03/28/19 0337 08/04/19 0508  BNP 860.5* 703.7*  7,017.7*   Basic Metabolic Panel: Recent Labs  Lab 08/04/19 0508 08/05/19 0536 08/06/19 0543  NA 141 141 141  K 3.4* 3.1* 3.6  CL 109 107 108  CO2 22 23 24   GLUCOSE 140* 137* 139*  BUN 22 28* 29*  CREATININE 2.67* 2.95* 3.00*  CALCIUM 9.0 9.0 8.8*  MG  --  1.9 1.8   Liver Function Tests: Recent Labs  Lab 08/04/19 0508  AST 11*  ALT 9  ALKPHOS 64  BILITOT 1.1  PROT 6.6  ALBUMIN 3.6   No results for input(s): LIPASE, AMYLASE in the last 168 hours. No results for input(s): AMMONIA in the last 168 hours. CBC: Recent Labs  Lab 08/04/19  3710 08/05/19 0536 08/06/19 0543  WBC 6.1 8.0 6.9  NEUTROABS 4.6 6.6  --   HGB 10.1* 10.2* 10.0*  HCT 31.5* 30.9* 30.2*  MCV 83.3 80.7 81.0  PLT 259 250 218   Cardiac Enzymes: No results for input(s): CKTOTAL, CKMB, CKMBINDEX, TROPONINI in the last 168 hours. BNP: Invalid input(s): POCBNP CBG: Recent Labs  Lab 08/05/19 1112 08/05/19 1608 08/05/19 2002 08/06/19 0545 08/06/19 1108  GLUCAP 133* 106* 176* 129* 135*   D-Dimer No results for input(s): DDIMER in the last 72 hours. Hgb A1c Recent Labs    08/04/19 1645  HGBA1C 7.2*   Lipid Profile No results for input(s): CHOL, HDL, LDLCALC, TRIG, CHOLHDL, LDLDIRECT in the last 72 hours. Thyroid function studies Recent Labs    08/04/19 0508  TSH 1.171   Anemia work up No results for input(s): VITAMINB12, FOLATE, FERRITIN, TIBC, IRON, RETICCTPCT in the last 72 hours. Urinalysis    Component Value Date/Time   COLORURINE YELLOW 04/03/2019 1640   APPEARANCEUR CLEAR 04/03/2019 1640   LABSPEC 1.017 04/03/2019 1640   PHURINE 5.0 04/03/2019 1640   GLUCOSEU 50 (A) 04/03/2019 1640   HGBUR NEGATIVE 04/03/2019 1640   HGBUR negative 09/19/2007 0000   BILIRUBINUR NEGATIVE 04/03/2019 1640   BILIRUBINUR n 11/18/2014 1003   KETONESUR NEGATIVE 04/03/2019 1640   PROTEINUR >=300 (A) 04/03/2019 1640   UROBILINOGEN 0.2 11/18/2014 1003   UROBILINOGEN 1.0 01/26/2013 2059   NITRITE  NEGATIVE 04/03/2019 1640   LEUKOCYTESUR NEGATIVE 04/03/2019 1640   Sepsis Labs Invalid input(s): PROCALCITONIN,  WBC,  LACTICIDVEN Microbiology Recent Results (from the past 240 hour(s))  SARS CORONAVIRUS 2 (TAT 6-24 HRS) Nasopharyngeal Nasopharyngeal Swab     Status: None   Collection Time: 08/04/19 10:54 AM   Specimen: Nasopharyngeal Swab  Result Value Ref Range Status   SARS Coronavirus 2 NEGATIVE NEGATIVE Final    Comment: (NOTE) SARS-CoV-2 target nucleic acids are NOT DETECTED. The SARS-CoV-2 RNA is generally detectable in upper and lower respiratory specimens during the acute phase of infection. Negative results do not preclude SARS-CoV-2 infection, do not rule out co-infections with other pathogens, and should not be used as the sole basis for treatment or other patient management decisions. Negative results must be combined with clinical observations, patient history, and epidemiological information. The expected result is Negative. Fact Sheet for Patients: SugarRoll.be Fact Sheet for Healthcare Providers: https://www.woods-mathews.com/ This test is not yet approved or cleared by the [place] FDA and  has been authorized for detection and/or diagnosis of SARS-CoV-2 by FDA under an Emergency Use Authorization (EUA). This EUA will remain  in effect (meaning this test can be used) for the duration of the COVID-19 declaration under Section 56 4(b)(1) of the Act, 21 U.S.C. section 360bbb-3(b)(1), unless the authorization is terminated or revoked sooner. Performed at [facility], [place] [address], [place], [place] [address]      Time coordinating discharge:  I have spent 35 minutes face to face with the patient and on the ward discussing the patients care, assessment, plan and disposition with other care givers. >50% of the time was devoted counseling the patient about the risks and benefits of treatment/Discharge disposition  and coordinating care.   SIGNED:   Damita Lack, MD  [facility] 08/06/2019, 11:24 AM   If 7PM-7AM, please contact night-coverage

## 2019-08-08 ENCOUNTER — Telehealth: Payer: Self-pay

## 2019-08-08 NOTE — Telephone Encounter (Signed)
Transition Care Management Follow-up Telephone Call  Date of discharge and from where: 08/06/2019 [facility]   How have you been since you were released from the hospital? "I'm doing pretty good"  Any questions or concerns? No   Items Reviewed:  Did the pt receive and understand the discharge instructions provided? Yes   Medications obtained and verified? Yes   Any new allergies since your discharge? No   Dietary orders reviewed? Yes  Do you have support at home? Yes   Other (ie: DME, Home Health, etc) no   Functional Questionnaire: (I = Independent and D = Dependent) ADL's: Patient is independent except his wife prepares his meals. He is able to perform all other tasks independently.  Bathing/Dressing-    Meal Prep-   Eating-   Maintaining continence-   Transferring/Ambulation-   Managing Meds-    Follow up appointments reviewed:  PCP Hospital f/u appt confirmed? Yes  Scheduled to see Tommi Rumps 08/14/19 10am.  [facility] f/u appt confirmed? Yes  Scheduled to see Dr. Franklyn Lor 08/19/19 per patient.  Are transportation arrangements needed? No   If their condition worsens, is the pt aware to call  their PCP or go to the ED? Yes  Was the patient provided with contact information for the PCP's office or ED? Yes  Was the pt encouraged to call back with questions or concerns? Yes

## 2019-08-14 ENCOUNTER — Ambulatory Visit: Payer: Medicare Other | Admitting: Adult Health

## 2019-08-14 ENCOUNTER — Encounter: Payer: Self-pay | Admitting: Adult Health

## 2019-08-14 ENCOUNTER — Other Ambulatory Visit: Payer: Self-pay

## 2019-08-14 VITALS — BP 160/60 | HR 55 | Temp 98.3°F | Wt 185.0 lb

## 2019-08-14 DIAGNOSIS — N184 Chronic kidney disease, stage 4 (severe): Secondary | ICD-10-CM | POA: Diagnosis not present

## 2019-08-14 DIAGNOSIS — E876 Hypokalemia: Secondary | ICD-10-CM

## 2019-08-14 DIAGNOSIS — I10 Essential (primary) hypertension: Secondary | ICD-10-CM

## 2019-08-14 DIAGNOSIS — E0821 Diabetes mellitus due to underlying condition with diabetic nephropathy: Secondary | ICD-10-CM | POA: Diagnosis not present

## 2019-08-14 DIAGNOSIS — I5033 Acute on chronic diastolic (congestive) heart failure: Secondary | ICD-10-CM

## 2019-08-14 LAB — CBC WITH DIFFERENTIAL/PLATELET
Basophils Absolute: 0 10*3/uL (ref 0.0–0.1)
Basophils Relative: 0.5 % (ref 0.0–3.0)
Eosinophils Absolute: 0.1 10*3/uL (ref 0.0–0.7)
Eosinophils Relative: 1.9 % (ref 0.0–5.0)
HCT: 30.6 % — ABNORMAL LOW (ref 39.0–52.0)
Hemoglobin: 10 g/dL — ABNORMAL LOW (ref 13.0–17.0)
Lymphocytes Relative: 14.3 % (ref 12.0–46.0)
Lymphs Abs: 0.8 10*3/uL (ref 0.7–4.0)
MCHC: 32.7 g/dL (ref 30.0–36.0)
MCV: 81.9 fl (ref 78.0–100.0)
Monocytes Absolute: 0.4 10*3/uL (ref 0.1–1.0)
Monocytes Relative: 7.3 % (ref 3.0–12.0)
Neutro Abs: 4.4 10*3/uL (ref 1.4–7.7)
Neutrophils Relative %: 76 % (ref 43.0–77.0)
Platelets: 223 10*3/uL (ref 150.0–400.0)
RBC: 3.73 Mil/uL — ABNORMAL LOW (ref 4.22–5.81)
RDW: 15.2 % (ref 11.5–15.5)
WBC: 5.8 10*3/uL (ref 4.0–10.5)

## 2019-08-14 LAB — BASIC METABOLIC PANEL
BUN: 30 mg/dL — ABNORMAL HIGH (ref 6–23)
CO2: 23 mEq/L (ref 19–32)
Calcium: 8.8 mg/dL (ref 8.4–10.5)
Chloride: 105 mEq/L (ref 96–112)
Creatinine, Ser: 2.73 mg/dL — ABNORMAL HIGH (ref 0.40–1.50)
GFR: 27.1 mL/min — ABNORMAL LOW (ref >60.00)
Glucose, Bld: 139 mg/dL — ABNORMAL HIGH (ref 70–99)
Potassium: 3.3 mEq/L — ABNORMAL LOW (ref 3.5–5.1)
Sodium: 137 mEq/L (ref 135–145)

## 2019-08-14 NOTE — Progress Notes (Signed)
Subjective:    Patient ID: Austin [name], male    DOB: 1935-03-06, 84 y.o.   MRN: [id number]  HPI 84 year old male who  has a past medical history of ANEMIA DUE TO CHRONIC BLOOD LOSS (03/13/2007), CAROTID ARTERY STENOSIS (05/10/2010), CHF (congestive heart failure) ([place]), DIABETES MELLITUS, TYPE II (09/19/2007), DISEASE, CEREBROVASCULAR NEC (03/05/2007), GERD (03/13/2007), HYPERLIPIDEMIA (03/05/2007), HYPERTENSION (03/05/2007), HYPOKALEMIA (11/09/2009), KNEE PAIN, RIGHT (11/09/2009), PROSTATE CANCER, HX OF (03/05/2007), and RENAL DISEASE, CHRONIC (02/03/2009).  He presents to the office today for TCM visit   Admit Date: 08/04/2019 Discharge Date: 08/06/2019  Presented to the emergency room with progressive shortness of breath, hypertensive urgency, and some hypoxia.  Evidence of fluid overload and was admitted for acute CHF exacerbation.  Over the course of 24 hours he was aggressively diuresed and improved at a much faster rate than expected.  His first day of admission he was hypoxic with ambulation but the following day he was saturating greater than 90% on room air with ambulation.  Symptomatically he felt much better and back to himself.  He was discharged with Lasix to be taken as needed for fluid overload and edema at home.  He has an upcoming appointment with cardiology  Hospital Course  Acute exacerbation of congestive heart failure, preserved EF 55%, class II -Echo in August 2020 showed an EF of 55 to 60% -EKG shows no acute ST changes -Last x-ray showed pulmonary edema, symmetric -Cardiac enzymes showed some demand ischemia  Hypertensive Urgency  -Resumed home meds on discharge.  Advised 2 g salt diet.  Elevated troponin -To be demand ischemia in the setting of fluid overload.  EKG was nonischemic  Hypokalemia -Replaced  -CKD stage V -Monitor renal function in the setting of diuresis  Anemia of chronic disease -IMA globin around baseline of 10  Diabetes  mellitus type II -  A1c  7.2.  Was resumed on home meds upon discharge  Today he reports that he is feeling "much better".  Is no longer experiencing shortness of breath.  He is taking Lasix daily due to continuing lower extremity edema but he feels as though this is improving.  Is also been cutting back on salt intake and trying to stay hydrated.    No acute complaints today  Review of Systems  Constitutional: Negative.   HENT: Negative.   Eyes: Negative.   Respiratory: Negative.   Cardiovascular: Positive for leg swelling.  Gastrointestinal: Negative.   Endocrine: Negative.   Genitourinary: Negative.   Musculoskeletal: Negative.   Skin: Negative.   Allergic/Immunologic: Negative.   Neurological: Negative.   Hematological: Negative.   Psychiatric/Behavioral: Negative.   All other systems reviewed and are negative.  Past Medical History:  Diagnosis Date  . 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 CAROTID ARTERY STENOSIS 05/10/2010  . CHF (congestive heart failure) ([place])   . DIABETES MELLITUS, TYPE II 09/19/2007  . DISEASE, CEREBROVASCULAR NEC 03/05/2007  . GERD 03/13/2007  . HYPERLIPIDEMIA 03/05/2007  . HYPERTENSION 03/05/2007  . HYPOKALEMIA 11/09/2009  . KNEE PAIN, RIGHT 11/09/2009  . PROSTATE CANCER, HX OF 03/05/2007  . RENAL DISEASE, CHRONIC 02/03/2009    Social History   Socioeconomic History  . Marital status: Married    Spouse name: Not on file  . Number of children: Not on file  . Years of education: Not on file  . Highest education level: Not on file  Occupational History  . Not on file  Tobacco Use  . Smoking status: Former  Smoker    Types: Cigarettes    Quit date: 08/22/1974    Years since quitting: 45.0  . Smokeless tobacco: Never Used  Substance and Sexual Activity  . Alcohol use: No    Alcohol/week: 0.0 standard drinks  . Drug use: No  . Sexual activity: Not on file  Other Topics Concern  . Not on file  Social History Narrative   Retired - Environmental consultant    Married 27  years       He enjoys traveling    Investment banker, operational of Radio broadcast assistant Strain:   . Difficulty of Paying Living Expenses: Not on file  Food Insecurity:   . Worried About Charity fundraiser in the Last Year: Not on file  . Ran Out of Food in the Last Year: Not on file  Transportation Needs:   . Lack of Transportation (Medical): Not on file  . Lack of Transportation (Non-Medical): Not on file  Physical Activity:   . Days of Exercise per Week: Not on file  . Minutes of Exercise per Session: Not on file  Stress:   . Feeling of Stress : Not on file  Social Connections:   . Frequency of Communication with Friends and Family: Not on file  . Frequency of Social Gatherings with Friends and Family: Not on file  . Attends Religious Services: Not on file  . Active Member of Clubs or Organizations: Not on file  . Attends Archivist Meetings: Not on file  . Marital Status: Not on file  Intimate Partner Violence:   . Fear of Current or Ex-Partner: Not on file  . Emotionally Abused: Not on file  . Physically Abused: Not on file  . Sexually Abused: Not on file    Past Surgical History:  Procedure Laterality Date  . CAROTID ARTERY ANGIOPLASTY Right Oct. 10, 2001  . ESOPHAGOGASTRODUODENOSCOPY (EGD) WITH PROPOFOL N/A 11/04/2016   Procedure: ESOPHAGOGASTRODUODENOSCOPY (EGD) WITH PROPOFOL;  Surgeon: Otis Brace, MD;  Location: [facility];  Service: Gastroenterology;  Laterality: N/A;  . LAPAROSCOPIC APPENDECTOMY  02/20/2012   Procedure: APPENDECTOMY LAPAROSCOPIC;  Surgeon: Stark Klein, MD;  Location: [facility];  Service: General;  Laterality: N/A;  . PROSTATE SURGERY     prostatectomy    Family History  Problem Relation Age of Onset  . Hypertension Mother   . Cancer Father        Mesothelioma   . Stomach cancer Brother   . Cancer Brother   . Cancer - Cervical Brother   . Cancer Brother   . Diabetes Brother   . Esophageal cancer Neg Hx   . Colon cancer Neg Hx    . Pancreatic cancer Neg Hx     Allergies  Allergen Reactions  . Aspirin Other (See Comments)    High doses causes stomach ulcer and bleeding    Current Outpatient Medications on File Prior to Visit  Medication Sig Dispense Refill  . acetaminophen (TYLENOL) 325 MG tablet Take 650 mg by mouth every 6 (six) hours as needed for mild pain or moderate pain.    Marland Kitchen amLODipine (NORVASC) 10 MG tablet TAKE 1 TABLET BY MOUTH EVERY DAY (Patient taking differently: Take 10 mg by mouth daily. ) 90 tablet 3  . atorvastatin (LIPITOR) 20 MG tablet TAKE 1 TABLET BY MOUTH EVERY DAY (Patient taking differently: Take 20 mg by mouth daily at 6 PM. ) 90 tablet 3  . cloNIDine (CATAPRES) 0.2 MG tablet TAKE 1 TABLET (0.2 MG  TOTAL) BY MOUTH 3 (THREE) TIMES DAILY. 270 tablet 3  . clopidogrel (PLAVIX) 75 MG tablet TAKE 1 TABLET BY MOUTH EVERY DAY (Patient taking differently: Take 75 mg by mouth daily. ) 90 tablet 3  . [employer] D3 25 MCG (1000 UT) capsule Take 1,000 Units by mouth daily.    Marland Kitchen doxazosin (CARDURA) 2 MG tablet Take 2 mg by mouth at bedtime.   3  . esomeprazole (NEXIUM) 40 MG capsule TAKE 1 CAPSULE BY MOUTH EVERY DAY (Patient taking differently: Take 40 mg by mouth daily. ) 90 capsule 3  . furosemide (LASIX) 40 MG tablet Take 1 tablet (40 mg total) by mouth daily as needed for fluid or edema. 30 tablet 0  . glipiZIDE (GLUCOTROL XL) 5 MG 24 hr tablet TAKE 1 TABLET BY MOUTH EVERY DAY (Patient taking differently: Take 5 mg by mouth daily with breakfast. ) 90 tablet 1  . glucose blood (ONETOUCH VERIO) test strip USE TO TEST BLOOD GLUCOSE ONCE DAILY 100 each 3  . hydrocortisone (ANUSOL-HC) 2.5 % rectal cream PLACE 1 APPLICATION RECTALLY 2 TIMES A DAY. (Patient taking differently: Place 1 application rectally daily. ) 30 g 3  . isosorbide-hydrALAZINE (BIDIL) 20-37.5 MG tablet Take 1 tablet by mouth 4 (four) times daily.     Marland Kitchen KLOR-CON M10 10 MEQ tablet Take 10 mEq by mouth daily.    Marland Kitchen lactulose (CHRONULAC) 10 GM/15ML  solution TAKE 15 ML BY MOUTH 2 TIMES DAILY AS NEEDED FOR MILD CONSTIPATION. (Patient taking differently: Take 20 g by mouth daily as needed for mild constipation. ) 2838 mL 3  . losartan (COZAAR) 25 MG tablet Take 25 mg by mouth daily.    . metoprolol succinate (TOPROL-XL) 25 MG 24 hr tablet Take 4 tablets (100 mg total) by mouth daily.    Glory Rosebush Delica Lancets 37S MISC USE TO CHECK BLOOD SUGAR DAILY. 100 each 3  . triamcinolone cream (KENALOG) 0.1 % Apply 1 application topically 2 (two) times daily.     No current facility-administered medications on file prior to visit.    BP (!) 160/60   Pulse (!) 55   Temp 98.3 F (36.8 C) (Temporal)   Wt 185 lb (83.9 kg)   SpO2 98%   BMI 28.98 kg/m       Objective:   Physical Exam Vitals and nursing note reviewed.  Constitutional:      Appearance: Normal appearance.  Cardiovascular:     Rate and Rhythm: Normal rate and regular rhythm.     Pulses: Normal pulses.     Heart sounds: Normal heart sounds. No murmur. No friction rub. No gallop.   Pulmonary:     Effort: Pulmonary effort is normal. No respiratory distress.     Breath sounds: Normal breath sounds. No stridor. No wheezing, rhonchi or rales.  Chest:     Chest wall: No tenderness.  Abdominal:     General: Abdomen is flat. There is no distension.     Palpations: Abdomen is soft. There is no mass.     Tenderness: There is no abdominal tenderness. There is no right CVA tenderness, left CVA tenderness, guarding or rebound.     Hernia: No hernia is present.  Musculoskeletal:        General: No swelling, tenderness, deformity or signs of injury. Normal range of motion.     Cervical back: Normal range of motion and neck supple.     Right lower leg: 1+ Pitting Edema present.  Left lower leg: 1+ Pitting Edema present.  Skin:    General: Skin is warm and dry.     Capillary Refill: Capillary refill takes less than 2 seconds.     Coloration: Skin is not jaundiced or pale.      Findings: No bruising, erythema, lesion or rash.  Neurological:     General: No focal deficit present.     Mental Status: He is alert and oriented to person, place, and time.  Psychiatric:        Mood and Affect: Mood normal.        Behavior: Behavior normal.        Thought Content: Thought content normal.        Judgment: Judgment normal.       Assessment & Plan:  1. Acute on chronic diastolic congestive heart failure ([place]) -We reviewed his hospital notes, discharge instruction, labs, and imaging.  Fully he is feeling much better than when he was admitted to the hospital.  We will have him continue on Lasix as needed.  Encourage low-sodium diet.  Follow-up with cardiology as directed - CBC with Differential/Platelet - Basic Metabolic Panel  2. CKD (chronic kidney disease) stage 4, GFR 15-29 ml/min ([facility]) -Follow-up with nephrology as directed - CBC with Differential/Platelet - Basic Metabolic Panel  3. Diabetes mellitus due to underlying condition with diabetic nephropathy, without long-term current use of insulin ([place]) -Well-controlled.  Continue with current medication - CBC with Differential/Platelet - Basic Metabolic Panel  4. Essential hypertension -Slightly elevated today.  No change in medications - CBC with Differential/Platelet - Basic Metabolic Panel  5. Hypokalemia - Continue with K supplementation while taking lasix  - CBC with Differential/Platelet - Basic Metabolic Panel   Dorothyann Peng, NP

## 2019-08-19 ENCOUNTER — Other Ambulatory Visit: Payer: Self-pay

## 2019-08-19 ENCOUNTER — Inpatient Hospital Stay: Payer: Medicare Other | Attending: Hematology and Oncology

## 2019-08-19 DIAGNOSIS — Z8546 Personal history of malignant neoplasm of prostate: Secondary | ICD-10-CM | POA: Insufficient documentation

## 2019-08-19 DIAGNOSIS — N184 Chronic kidney disease, stage 4 (severe): Secondary | ICD-10-CM | POA: Insufficient documentation

## 2019-08-19 DIAGNOSIS — K219 Gastro-esophageal reflux disease without esophagitis: Secondary | ICD-10-CM | POA: Diagnosis not present

## 2019-08-19 DIAGNOSIS — Z79899 Other long term (current) drug therapy: Secondary | ICD-10-CM | POA: Insufficient documentation

## 2019-08-19 DIAGNOSIS — E876 Hypokalemia: Secondary | ICD-10-CM | POA: Diagnosis not present

## 2019-08-19 DIAGNOSIS — I129 Hypertensive chronic kidney disease with stage 1 through stage 4 chronic kidney disease, or unspecified chronic kidney disease: Secondary | ICD-10-CM | POA: Diagnosis not present

## 2019-08-19 DIAGNOSIS — R768 Other specified abnormal immunological findings in serum: Secondary | ICD-10-CM | POA: Diagnosis present

## 2019-08-19 DIAGNOSIS — D472 Monoclonal gammopathy: Secondary | ICD-10-CM

## 2019-08-19 DIAGNOSIS — E785 Hyperlipidemia, unspecified: Secondary | ICD-10-CM | POA: Insufficient documentation

## 2019-08-19 DIAGNOSIS — Z87891 Personal history of nicotine dependence: Secondary | ICD-10-CM | POA: Diagnosis not present

## 2019-08-19 DIAGNOSIS — E1122 Type 2 diabetes mellitus with diabetic chronic kidney disease: Secondary | ICD-10-CM | POA: Insufficient documentation

## 2019-08-19 LAB — CBC WITH DIFFERENTIAL (CANCER CENTER ONLY)
Abs Immature Granulocytes: 0.02 10*3/uL (ref 0.00–0.07)
Basophils Absolute: 0 10*3/uL (ref 0.0–0.1)
Basophils Relative: 1 %
Eosinophils Absolute: 0.2 10*3/uL (ref 0.0–0.5)
Eosinophils Relative: 2 %
HCT: 30.1 % — ABNORMAL LOW (ref 39.0–52.0)
Hemoglobin: 9.7 g/dL — ABNORMAL LOW (ref 13.0–17.0)
Immature Granulocytes: 0 %
Lymphocytes Relative: 12 %
Lymphs Abs: 0.8 10*3/uL (ref 0.7–4.0)
MCH: 26.6 pg (ref 26.0–34.0)
MCHC: 32.2 g/dL (ref 30.0–36.0)
MCV: 82.5 fL (ref 80.0–100.0)
Monocytes Absolute: 0.4 10*3/uL (ref 0.1–1.0)
Monocytes Relative: 7 %
Neutro Abs: 4.9 10*3/uL (ref 1.7–7.7)
Neutrophils Relative %: 78 %
Platelet Count: 262 10*3/uL (ref 150–400)
RBC: 3.65 MIL/uL — ABNORMAL LOW (ref 4.22–5.81)
RDW: 14.9 % (ref 11.5–15.5)
WBC Count: 6.4 10*3/uL (ref 4.0–10.5)
nRBC: 0 % (ref 0.0–0.2)

## 2019-08-19 LAB — CMP (CANCER CENTER ONLY)
ALT: <6 U/L (ref 0–44)
AST: 8 U/L — ABNORMAL LOW (ref 15–41)
Albumin: 3.5 g/dL (ref 3.5–5.0)
Alkaline Phosphatase: 70 U/L (ref 38–126)
Anion gap: 8 (ref 5–15)
BUN: 33 mg/dL — ABNORMAL HIGH (ref 8–23)
CO2: 24 mmol/L (ref 22–32)
Calcium: 8.5 mg/dL — ABNORMAL LOW (ref 8.9–10.3)
Chloride: 110 mmol/L (ref 98–111)
Creatinine: 2.81 mg/dL — ABNORMAL HIGH (ref 0.61–1.24)
GFR, Est AFR Am: 23 mL/min — ABNORMAL LOW (ref >60)
GFR, Estimated: 20 mL/min — ABNORMAL LOW (ref >60)
Glucose, Bld: 127 mg/dL — ABNORMAL HIGH (ref 70–99)
Potassium: 3.5 mmol/L (ref 3.5–5.1)
Sodium: 142 mmol/L (ref 135–145)
Total Bilirubin: 0.4 mg/dL (ref 0.3–1.2)
Total Protein: 6.5 g/dL (ref 6.5–8.1)

## 2019-08-20 LAB — MULTIPLE MYELOMA PANEL, SERUM
Albumin SerPl Elph-Mcnc: 3.4 g/dL (ref 2.9–4.4)
Albumin/Glob SerPl: 1.4 (ref 0.7–1.7)
Alpha 1: 0.2 g/dL (ref 0.0–0.4)
Alpha2 Glob SerPl Elph-Mcnc: 0.4 g/dL (ref 0.4–1.0)
B-Globulin SerPl Elph-Mcnc: 0.8 g/dL (ref 0.7–1.3)
Gamma Glob SerPl Elph-Mcnc: 1.2 g/dL (ref 0.4–1.8)
Globulin, Total: 2.6 g/dL (ref 2.2–3.9)
IgA: 184 mg/dL (ref 61–437)
IgG (Immunoglobin G), Serum: 1365 mg/dL (ref 603–1613)
IgM (Immunoglobulin M), Srm: 43 mg/dL (ref 15–143)
Total Protein ELP: 6 g/dL (ref 6.0–8.5)

## 2019-08-20 LAB — KAPPA/LAMBDA LIGHT CHAINS
Kappa free light chain: 90.7 mg/L — ABNORMAL HIGH (ref 3.3–19.4)
Kappa, lambda light chain ratio: 2.54 — ABNORMAL HIGH (ref 0.26–1.65)
Lambda free light chains: 35.7 mg/L — ABNORMAL HIGH (ref 5.7–26.3)

## 2019-08-22 ENCOUNTER — Inpatient Hospital Stay: Payer: Self-pay | Admitting: Adult Health

## 2019-08-25 NOTE — Progress Notes (Signed)
Patient Care Team: Dorothyann Peng, NP as PCP - General (Family Medicine) Charolette Forward, MD as Consulting Physician (Cardiology) Milus Height, MD as Consulting Physician (Ophthalmology) Lowella Bandy, MD as Consulting Physician (Urology) Milus Banister, MD as Attending Physician (Gastroenterology)  DIAGNOSIS:    ICD-10-CM   1. Elevated serum immunoglobulin free light chains  R76.8     CHIEF COMPLIANT: Follow-up of elevated free light chain ratio  INTERVAL HISTORY: Austin [name] is a 85 y.o. with above-mentioned history of elevated free light chain ratio. Labs from 08/19/19 showed: Hg 9.2, HCT 30.1, MCV 82.5, creatinine 2.81, kappa light chain 90.7, lambda light chain 35.7, ratio 2.54, M-spike not observed. He presents to the clinic today for follow-up.  He was recently in the hospital for congestive heart failure and was put on diuretics.  He was noted to be anemic in the hospital.   ALLERGIES:  is allergic to aspirin.  MEDICATIONS:  Current Outpatient Medications  Medication Sig Dispense Refill  . acetaminophen (TYLENOL) 325 MG tablet Take 650 mg by mouth every 6 (six) hours as needed for mild pain or moderate pain.    Marland Kitchen amLODipine (NORVASC) 10 MG tablet TAKE 1 TABLET BY MOUTH EVERY DAY (Patient taking differently: Take 10 mg by mouth daily. ) 90 tablet 3  . atorvastatin (LIPITOR) 20 MG tablet TAKE 1 TABLET BY MOUTH EVERY DAY (Patient taking differently: Take 20 mg by mouth daily at 6 PM. ) 90 tablet 3  . cloNIDine (CATAPRES) 0.2 MG tablet TAKE 1 TABLET (0.2 MG TOTAL) BY MOUTH 3 (THREE) TIMES DAILY. 270 tablet 3  . clopidogrel (PLAVIX) 75 MG tablet TAKE 1 TABLET BY MOUTH EVERY DAY (Patient taking differently: Take 75 mg by mouth daily. ) 90 tablet 3  . CVS D3 25 MCG (1000 UT) capsule Take 1,000 Units by mouth daily.    Marland Kitchen doxazosin (CARDURA) 2 MG tablet Take 2 mg by mouth at bedtime.   3  . esomeprazole (NEXIUM) 40 MG capsule TAKE 1 CAPSULE BY MOUTH EVERY DAY (Patient taking  differently: Take 40 mg by mouth daily. ) 90 capsule 3  . furosemide (LASIX) 40 MG tablet Take 1 tablet (40 mg total) by mouth daily as needed for fluid or edema. 30 tablet 0  . glipiZIDE (GLUCOTROL XL) 5 MG 24 hr tablet TAKE 1 TABLET BY MOUTH EVERY DAY (Patient taking differently: Take 5 mg by mouth daily with breakfast. ) 90 tablet 1  . glucose blood (ONETOUCH VERIO) test strip USE TO TEST BLOOD GLUCOSE ONCE DAILY 100 each 3  . hydrocortisone (ANUSOL-HC) 2.5 % rectal cream PLACE 1 APPLICATION RECTALLY 2 TIMES A DAY. (Patient taking differently: Place 1 application rectally daily. ) 30 g 3  . isosorbide-hydrALAZINE (BIDIL) 20-37.5 MG tablet Take 1 tablet by mouth 4 (four) times daily.     Marland Kitchen KLOR-CON M10 10 MEQ tablet Take 10 mEq by mouth daily.    Marland Kitchen lactulose (CHRONULAC) 10 GM/15ML solution TAKE 15 ML BY MOUTH 2 TIMES DAILY AS NEEDED FOR MILD CONSTIPATION. (Patient taking differently: Take 20 g by mouth daily as needed for mild constipation. ) 2838 mL 3  . losartan (COZAAR) 25 MG tablet Take 25 mg by mouth daily.    . metoprolol succinate (TOPROL-XL) 25 MG 24 hr tablet Take 4 tablets (100 mg total) by mouth daily.    Glory Rosebush Delica Lancets 81E MISC USE TO CHECK BLOOD SUGAR DAILY. 100 each 3  . triamcinolone cream (KENALOG) 0.1 % Apply 1 application topically  2 (two) times daily.     No current facility-administered medications for this visit.    PHYSICAL EXAMINATION: ECOG PERFORMANCE STATUS: 1 - Symptomatic but completely ambulatory  Vitals:   08/26/19 0911  BP: (!) 168/59  Pulse: (!) 58  Resp: 17  Temp: 98 F (36.7 C)  SpO2: 100%   Filed Weights   08/26/19 0911  Weight: 187 lb 9.6 oz (85.1 kg)     LABORATORY DATA:  I have reviewed the data as listed CMP Latest Ref Rng & Units 08/19/2019 08/14/2019 08/06/2019  Glucose 70 - 99 mg/dL 127(H) 139(H) 139(H)  BUN 8 - 23 mg/dL 33(H) 30(H) 29(H)  Creatinine 0.61 - 1.24 mg/dL 2.81(H) 2.73(H) 3.00(H)  Sodium 135 - 145 mmol/L 142 137  141  Potassium 3.5 - 5.1 mmol/L 3.5 3.3(L) 3.6  Chloride 98 - 111 mmol/L 110 105 108  CO2 22 - 32 mmol/L 24 23 24   Calcium 8.9 - 10.3 mg/dL 8.5(L) 8.8 8.8(L)  Total Protein 6.5 - 8.1 g/dL 6.5 - -  Total Bilirubin 0.3 - 1.2 mg/dL 0.4 - -  Alkaline Phos 38 - 126 U/L 70 - -  AST 15 - 41 U/L 8(L) - -  ALT 0 - 44 U/L <6 - -    Lab Results  Component Value Date   WBC 6.4 08/19/2019   HGB 9.7 (L) 08/19/2019   HCT 30.1 (L) 08/19/2019   MCV 82.5 08/19/2019   PLT 262 08/19/2019   NEUTROABS 4.9 08/19/2019    ASSESSMENT & PLAN:  Elevated serum immunoglobulin free light chains Chronic kidney disease work-up done by nephrology revealed elevated free light chains Lab review:  01/09/2019: No monoclonal protein,Serum kappa: 106.4,Serum lambda: 41.5,Kappa lambda ratio: 2.56,Creatinine 2.37, albumin 3.9,Hemoglobin 11.2 08/19/2019: No monoclonal protein, serum kappa 19.7, serum lambda 35.7, kappa lambda ratio: 2.54, creatinine 2.81, albumin 3.5, hemoglobin 9.7 Labs have remained unchanged. Hospitalization 08/04/2019-08/06/2019: CHF exacerbation  Most likely cause of his anemia: Anemia of chronic kidney disease: Continue with nephrology care for this. Since the light chains are elevated polyclonal it there is no concern for myeloma. Patient can be seen on an as-needed basis.    No orders of the defined types were placed in this encounter.  The patient has a good understanding of the overall plan. he agrees with it. he will call with any problems that may develop before the next visit here.  Nicholas Lose, MD 08/26/2019  Julious Oka Dorshimer, am acting as scribe for Dr. Nicholas Lose.  I have reviewed the above document for accuracy and completeness, and I agree with the above.

## 2019-08-26 ENCOUNTER — Other Ambulatory Visit: Payer: Self-pay

## 2019-08-26 ENCOUNTER — Inpatient Hospital Stay: Payer: Medicare PPO | Attending: Hematology and Oncology | Admitting: Hematology and Oncology

## 2019-08-26 DIAGNOSIS — E1122 Type 2 diabetes mellitus with diabetic chronic kidney disease: Secondary | ICD-10-CM | POA: Insufficient documentation

## 2019-08-26 DIAGNOSIS — Z87891 Personal history of nicotine dependence: Secondary | ICD-10-CM | POA: Insufficient documentation

## 2019-08-26 DIAGNOSIS — I509 Heart failure, unspecified: Secondary | ICD-10-CM | POA: Insufficient documentation

## 2019-08-26 DIAGNOSIS — E785 Hyperlipidemia, unspecified: Secondary | ICD-10-CM | POA: Diagnosis not present

## 2019-08-26 DIAGNOSIS — R768 Other specified abnormal immunological findings in serum: Secondary | ICD-10-CM | POA: Diagnosis present

## 2019-08-26 DIAGNOSIS — Z8546 Personal history of malignant neoplasm of prostate: Secondary | ICD-10-CM | POA: Insufficient documentation

## 2019-08-26 DIAGNOSIS — I129 Hypertensive chronic kidney disease with stage 1 through stage 4 chronic kidney disease, or unspecified chronic kidney disease: Secondary | ICD-10-CM | POA: Diagnosis not present

## 2019-08-26 DIAGNOSIS — E876 Hypokalemia: Secondary | ICD-10-CM | POA: Insufficient documentation

## 2019-08-26 DIAGNOSIS — Z79899 Other long term (current) drug therapy: Secondary | ICD-10-CM | POA: Diagnosis not present

## 2019-08-26 DIAGNOSIS — N184 Chronic kidney disease, stage 4 (severe): Secondary | ICD-10-CM | POA: Insufficient documentation

## 2019-08-26 DIAGNOSIS — Z7984 Long term (current) use of oral hypoglycemic drugs: Secondary | ICD-10-CM | POA: Insufficient documentation

## 2019-08-26 DIAGNOSIS — K219 Gastro-esophageal reflux disease without esophagitis: Secondary | ICD-10-CM | POA: Diagnosis not present

## 2019-08-26 NOTE — Assessment & Plan Note (Signed)
Chronic kidney disease work-up done by nephrology revealed elevated free light chains Lab review:  01/09/2019: No monoclonal protein,Serum kappa: 106.4,Serum lambda: 41.5,Kappa lambda ratio: 2.56,Creatinine 2.37, albumin 3.9,Hemoglobin 11.2 08/19/2019: No monoclonal protein, serum kappa 19.7, serum lambda 35.7, kappa lambda ratio: 2.54, creatinine 2.81, albumin 3.5, hemoglobin 9.7 Labs have remained unchanged. Hospitalization 08/04/2019-08/06/2019: CHF exacerbation  Most likely cause of his anemia: Anemia of chronic kidney disease: Continue with nephrology care for this. Since the light chains are elevated polyclonal it there is no concern for myeloma. Patient can be seen on an as-needed basis.

## 2019-08-27 ENCOUNTER — Encounter: Payer: Self-pay | Admitting: Gastroenterology

## 2019-08-27 ENCOUNTER — Ambulatory Visit: Payer: Medicare PPO | Admitting: Gastroenterology

## 2019-08-27 VITALS — BP 140/58 | HR 56 | Temp 98.1°F | Ht 66.5 in | Wt 188.4 lb

## 2019-08-27 DIAGNOSIS — K5900 Constipation, unspecified: Secondary | ICD-10-CM

## 2019-08-27 NOTE — Patient Instructions (Signed)
If you are age 83 or older, your body mass index should be between 23-30. Your Body mass index is 29.95 kg/m. If this is out of the aforementioned range listed, please consider follow up with your Primary Care Provider.  If you are age 32 or younger, your body mass index should be between 19-25. Your Body mass index is 29.95 kg/m. If this is out of the aformentioned range listed, please consider follow up with your Primary Care Provider.   Contact office after visit with cardiology if you decide to go ahead with colonoscopy.

## 2019-08-27 NOTE — Progress Notes (Signed)
Colonoscopy June 2008 Dr. Verl Blalock for routine screening found diverticulosis but was otherwise normal.  EGD March 2018, [facility] gastroenterology, Dr. Alessandra Bevels.  Indication melena.  Findings polyps in the stomach one was felt to be the source of his bleeding because it had a blood clot on it.  Incompletely removed.  Biopsies from that polyp showed hyperplastic polyps.  Random biopsies from the stomach showed chronic gastritis without H. pylori.    EGD June 2018, Dr. Alessandra Bevels, indication follow-up of gastric polyps.  7 subcentimeter polyps were noted.  They were all removed with snare cautery.  Pathology from these polyps showed fundic gland and hyperplastic polyps.  Again negative for H. Pylori.  HPI: This is a very pleasant 85 year old man whom I last saw little over a year ago however He was last here in our office about 4 months ago 04/2019 and he saw [place].  His biggest issue at that visit was chronic constipation.  He was taking a fiber supplement but only once a week.  He was recommended to start taking the Citrucel every single day rather than just once a week.  He had also recently been prescribed lactulose by his primary care physician and he was recommended to start taking it twice a day.  Since that appointment he has been taking lactulose syrup twice daily every other day as well as Citrucel every other day.  On that every other day regimen he has a bowel movement about every other day.  He said if he took it daily he thinks he would "never be able to leave the house".  He has had no overt bleeding.  He is bothered by some gassiness and bloating.   ROS: complete GI ROS as described in HPI, all other review negative.  Constitutional:  No unintentional weight loss   Past Medical History:  Diagnosis Date  . 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 CAROTID ARTERY STENOSIS 05/10/2010  . CHF (congestive heart failure) ([place])   . DIABETES MELLITUS, TYPE II 09/19/2007  .  DISEASE, CEREBROVASCULAR NEC 03/05/2007  . GERD 03/13/2007  . HYPERLIPIDEMIA 03/05/2007  . HYPERTENSION 03/05/2007  . HYPOKALEMIA 11/09/2009  . KNEE PAIN, RIGHT 11/09/2009  . PROSTATE CANCER, HX OF 03/05/2007  . RENAL DISEASE, CHRONIC 02/03/2009    Past Surgical History:  Procedure Laterality Date  . CAROTID ARTERY ANGIOPLASTY Right Oct. 10, 2001  . ESOPHAGOGASTRODUODENOSCOPY (EGD) WITH PROPOFOL N/A 11/04/2016   Procedure: ESOPHAGOGASTRODUODENOSCOPY (EGD) WITH PROPOFOL;  Surgeon: Otis Brace, MD;  Location: [place];  Service: Gastroenterology;  Laterality: N/A;  . LAPAROSCOPIC APPENDECTOMY  02/20/2012   Procedure: APPENDECTOMY LAPAROSCOPIC;  Surgeon: Stark Klein, MD;  Location: [place];  Service: General;  Laterality: N/A;  . PROSTATE SURGERY     prostatectomy    Current Outpatient Medications  Medication Sig Dispense Refill  . acetaminophen (TYLENOL) 325 MG tablet Take 650 mg by mouth every 6 (six) hours as needed for mild pain or moderate pain.    Marland Kitchen amLODipine (NORVASC) 10 MG tablet TAKE 1 TABLET BY MOUTH EVERY DAY (Patient taking differently: Take 10 mg by mouth daily. ) 90 tablet 3  . atorvastatin (LIPITOR) 20 MG tablet TAKE 1 TABLET BY MOUTH EVERY DAY (Patient taking differently: Take 20 mg by mouth daily at 6 PM. ) 90 tablet 3  . cloNIDine (CATAPRES) 0.2 MG tablet TAKE 1 TABLET (0.2 MG TOTAL) BY MOUTH 3 (THREE) TIMES DAILY. 270 tablet 3  . clopidogrel (PLAVIX) 75 MG tablet TAKE 1 TABLET  BY MOUTH EVERY DAY (Patient taking differently: Take 75 mg by mouth daily. ) 90 tablet 3  . [facility] D3 25 MCG (1000 UT) capsule Take 1,000 Units by mouth daily.    Marland Kitchen doxazosin (CARDURA) 2 MG tablet Take 2 mg by mouth at bedtime.   3  . esomeprazole (NEXIUM) 40 MG capsule TAKE 1 CAPSULE BY MOUTH EVERY DAY (Patient taking differently: Take 40 mg by mouth daily. ) 90 capsule 3  . furosemide (LASIX) 40 MG tablet Take 1 tablet (40 mg total) by mouth daily as needed for fluid or edema. 30 tablet 0  .  glipiZIDE (GLUCOTROL XL) 5 MG 24 hr tablet TAKE 1 TABLET BY MOUTH EVERY DAY (Patient taking differently: Take 5 mg by mouth daily with breakfast. ) 90 tablet 1  . glucose blood (ONETOUCH VERIO) test strip USE TO TEST BLOOD GLUCOSE ONCE DAILY 100 each 3  . hydrocortisone (ANUSOL-HC) 2.5 % rectal cream PLACE 1 APPLICATION RECTALLY 2 TIMES A DAY. (Patient taking differently: Place 1 application rectally daily. ) 30 g 3  . isosorbide-hydrALAZINE (BIDIL) 20-37.5 MG tablet Take 1 tablet by mouth 4 (four) times daily.     Marland Kitchen KLOR-CON M10 10 MEQ tablet Take 10 mEq by mouth daily.    Marland Kitchen lactulose (CHRONULAC) 10 GM/15ML solution TAKE 15 ML BY MOUTH 2 TIMES DAILY AS NEEDED FOR MILD CONSTIPATION. (Patient taking differently: Take 20 g by mouth daily as needed for mild constipation. ) 2838 mL 3  . losartan (COZAAR) 25 MG tablet Take 25 mg by mouth daily.    . metoprolol succinate (TOPROL-XL) 25 MG 24 hr tablet Take 4 tablets (100 mg total) by mouth daily.    Glory Rosebush Delica Lancets 57S MISC USE TO CHECK BLOOD SUGAR DAILY. 100 each 3  . triamcinolone cream (KENALOG) 0.1 % Apply 1 application topically 2 (two) times daily.     No current facility-administered medications for this visit.    Allergies as of 08/27/2019 - Review Complete 08/27/2019  Allergen Reaction Noted  . Aspirin Other (See Comments) 02/20/2012    Family History  Problem Relation Age of Onset  . Hypertension Mother   . Cancer Father        Mesothelioma   . Stomach cancer Brother   . Cancer Brother   . Cancer - Cervical Brother   . Cancer Brother   . Diabetes Brother   . Esophageal cancer Neg Hx   . Colon cancer Neg Hx   . Pancreatic cancer Neg Hx     Social History   Socioeconomic History  . Marital status: Married    Spouse name: Not on file  . Number of children: Not on file  . Years of education: Not on file  . Highest education level: Not on file  Occupational History  . Not on file  Tobacco Use  . Smoking status:  Former Smoker    Types: Cigarettes    Quit date: 08/22/1974    Years since quitting: 45.0  . Smokeless tobacco: Never Used  Substance and Sexual Activity  . Alcohol use: No    Alcohol/week: 0.0 standard drinks  . Drug use: No  . Sexual activity: Not on file  Other Topics Concern  . Not on file  Social History Narrative   Retired - Environmental consultant    Married 24 years       He enjoys traveling    Investment banker, operational of Radio broadcast assistant Strain:   . Difficulty of  Paying Living Expenses: Not on file  Food Insecurity:   . Worried About Charity fundraiser in the Last Year: Not on file  . Ran Out of Food in the Last Year: Not on file  Transportation Needs:   . Lack of Transportation (Medical): Not on file  . Lack of Transportation (Non-Medical): Not on file  Physical Activity:   . Days of Exercise per Week: Not on file  . Minutes of Exercise per Session: Not on file  Stress:   . Feeling of Stress : Not on file  Social Connections:   . Frequency of Communication with Friends and Family: Not on file  . Frequency of Social Gatherings with Friends and Family: Not on file  . Attends Religious Services: Not on file  . Active Member of Clubs or Organizations: Not on file  . Attends Archivist Meetings: Not on file  . Marital Status: Not on file  Intimate Partner Violence:   . Fear of Current or Ex-Partner: Not on file  . Emotionally Abused: Not on file  . Physically Abused: Not on file  . Sexually Abused: Not on file     Physical Exam: BP (!) 140/58 (BP Location: Left Arm, Patient Position: Sitting, Cuff Size: Normal)   Pulse (!) 56   Temp 98.1 F (36.7 C)   Ht 5' 6.5" (1.689 m) Comment: height measured without shoes  Wt 188 lb 6 oz (85.4 kg)   BMI 29.95 kg/m  Constitutional: generally well-appearing Psychiatric: alert and oriented x3 Abdomen: soft, nontender, nondistended, no obvious ascites, no peritoneal signs, normal bowel sounds No peripheral  edema noted in lower extremities  Assessment and plan: 85 y.o. male with constipation, previous rectal bleeding  His constipation is improved on his current bowel regimen of lactulose and Citrucel both taken on an every other day basis.  He is bothered by bloating and gassiness however.  I explained to him that the lactulose is a very likely the culprit of those symptoms and I recommended that he try taking Citrucel every single day and stop the lactulose completely.  I am not sure if he is going to try that.  We discussed the fact that he had rectal bleeding about a year ago.  He is interested in a colonoscopy at this point however he is not sure if he should have that done.  I explained to him that if we did he would be at increased risk for bleeding complications given his ongoing blood thinner use I would want him to stop his Plavix for 5 days.  He is planning to meet with his cardiologist in the next few weeks to discuss routine cardiac issues and he will consider colonoscopy after discussing his cardiac care.  Please see the "Patient Instructions" section for addition details about the plan.  Owens Loffler, MD [facility] 08/27/2019, 10:54 AM

## 2019-09-02 ENCOUNTER — Other Ambulatory Visit: Payer: Self-pay | Admitting: Adult Health

## 2019-09-02 NOTE — Telephone Encounter (Signed)
Pt requests new meter and strips; also refill listed below; will route to office for final disposition. Requested medication (s) are due for refill today: Bldil,  yes  Requested medication (s) are on the active medication list: yes  Last refill: 04/04/2019  Future visit scheduled: o  Notes to clinic:  Historical provider

## 2019-09-02 NOTE — Telephone Encounter (Signed)
Please see below rx refill request

## 2019-09-02 NOTE — Telephone Encounter (Signed)
Pt called stating that his insurance will not cover the current meter he is using. He is needing a new meter and test strips sent in. Pt also states he is having trouble getting his bidil refilled as well. Please advise.     [id number] - [place]  [place]. [place] [address]  Phone: [phone] Fax: [phone]  Not a 24 hour pharmacy; exact hours not known.

## 2019-09-04 ENCOUNTER — Other Ambulatory Visit: Payer: Self-pay

## 2019-09-04 MED ORDER — ACCU-CHEK MULTICLIX LANCETS MISC: 12 refills | Status: DC

## 2019-09-04 MED ORDER — ACCU-CHEK AVIVA PLUS VI STRP: ORAL_STRIP | 12 refills | Status: DC

## 2019-09-04 MED ORDER — ACCU-CHEK AVIVA PLUS W/DEVICE KIT: PACK | 0 refills | Status: DC

## 2019-09-04 NOTE — Telephone Encounter (Signed)
This has been taking care of.

## 2019-09-25 ENCOUNTER — Encounter: Payer: Self-pay | Admitting: Adult Health

## 2019-09-25 ENCOUNTER — Ambulatory Visit: Payer: Medicare PPO | Admitting: Adult Health

## 2019-09-25 ENCOUNTER — Other Ambulatory Visit: Payer: Self-pay

## 2019-09-25 VITALS — BP 168/86 | HR 63 | Temp 97.2°F | Ht 66.5 in | Wt 189.0 lb

## 2019-09-25 DIAGNOSIS — E0821 Diabetes mellitus due to underlying condition with diabetic nephropathy: Secondary | ICD-10-CM | POA: Diagnosis not present

## 2019-09-25 DIAGNOSIS — B351 Tinea unguium: Secondary | ICD-10-CM

## 2019-09-25 NOTE — Progress Notes (Signed)
Subjective:    Patient ID: Austin [name], male    DOB: 1935/03/13, 85 y.o.   MRN: [id number]  HPI   85 year old male who  has a past medical history of ANEMIA DUE TO CHRONIC BLOOD LOSS (03/13/2007), CAROTID ARTERY STENOSIS (05/10/2010), CHF (congestive heart failure) ([place]), DIABETES MELLITUS, TYPE II (09/19/2007), DISEASE, CEREBROVASCULAR NEC (03/05/2007), GERD (03/13/2007), HYPERLIPIDEMIA (03/05/2007), HYPERTENSION (03/05/2007), HYPOKALEMIA (11/09/2009), KNEE PAIN, RIGHT (11/09/2009), PROSTATE CANCER, HX OF (03/05/2007), and RENAL DISEASE, CHRONIC (02/03/2009).  Like to be referred to podiatry for diabetic foot care as well as toenail fungus treatment.  History of diabetic neuropathy   Review of Systems See HPI   Past Medical History:  Diagnosis Date  . 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 CAROTID ARTERY STENOSIS 05/10/2010  . CHF (congestive heart failure) ([place])   . DIABETES MELLITUS, TYPE II 09/19/2007  . DISEASE, CEREBROVASCULAR NEC 03/05/2007  . GERD 03/13/2007  . HYPERLIPIDEMIA 03/05/2007  . HYPERTENSION 03/05/2007  . HYPOKALEMIA 11/09/2009  . KNEE PAIN, RIGHT 11/09/2009  . PROSTATE CANCER, HX OF 03/05/2007  . RENAL DISEASE, CHRONIC 02/03/2009    Social History   Socioeconomic History  . Marital status: Married    Spouse name: Not on file  . Number of children: Not on file  . Years of education: Not on file  . Highest education level: Not on file  Occupational History  . Not on file  Tobacco Use  . Smoking status: Former Smoker    Types: Cigarettes    Quit date: 08/22/1974    Years since quitting: 45.1  . Smokeless tobacco: Never Used  Substance and Sexual Activity  . Alcohol use: No    Alcohol/week: 0.0 standard drinks  . Drug use: No  . Sexual activity: Not on file  Other Topics Concern  . 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 Difficulty of Paying Living Expenses: Not on file  Food Insecurity:   . Worried About Charity fundraiser in the Last Year: Not on file  . Ran Out of Food in the Last Year: Not on file  Transportation Needs:   . Lack of Transportation (Medical): Not on file  . Lack of Transportation (Non-Medical): Not on file  Physical Activity:   . Days of Exercise per Week: Not on file  . Minutes of Exercise per Session: Not on file  Stress:   . Feeling of Stress : Not on file  Social Connections:   . Frequency of Communication with Friends and Family: Not on file  . Frequency of Social Gatherings with Friends and Family: Not on file  . Attends Religious Services: Not on file  . Active Member of Clubs or Organizations: Not on file  . Attends Archivist Meetings: Not on file  . Marital Status: Not on file  Intimate Partner Violence:   . Fear of Current or Ex-Partner: Not on file  . Emotionally Abused: Not on file  . Physically Abused: Not on file  . Sexually Abused: Not on file    Past Surgical History:  Procedure Laterality Date  . CAROTID ARTERY ANGIOPLASTY Right Oct. 10, 2001  . ESOPHAGOGASTRODUODENOSCOPY (EGD) WITH PROPOFOL N/A 11/04/2016   Procedure: ESOPHAGOGASTRODUODENOSCOPY (EGD) WITH PROPOFOL;  Surgeon: Otis Brace, MD;  [facility]  ENDOSCOPY;  Service: Gastroenterology;  Laterality: N/A;  . LAPAROSCOPIC APPENDECTOMY  02/20/2012   Procedure: APPENDECTOMY LAPAROSCOPIC;  Surgeon: Stark Klein, MD;  Location: [facility] OR;  Service: General;  Laterality: N/A;  . PROSTATE SURGERY     prostatectomy    Family History  Problem Relation Age of Onset  . Hypertension Mother   . Cancer Father        Mesothelioma   . Stomach cancer Brother   . Cancer Brother   . Cancer - Cervical Brother   . Cancer Brother   . Diabetes Brother   . Esophageal cancer Neg Hx   . Colon cancer Neg Hx   . Pancreatic cancer Neg Hx     Allergies  Allergen Reactions  . Aspirin Other (See Comments)     High doses causes stomach ulcer and bleeding    Current Outpatient Medications on File Prior to Visit  Medication Sig Dispense Refill  . acetaminophen (TYLENOL) 325 MG tablet Take 650 mg by mouth every 6 (six) hours as needed for mild pain or moderate pain.    Marland Kitchen amLODipine (NORVASC) 10 MG tablet TAKE 1 TABLET BY MOUTH EVERY DAY (Patient taking differently: Take 10 mg by mouth daily. ) 90 tablet 3  . atorvastatin (LIPITOR) 20 MG tablet TAKE 1 TABLET BY MOUTH EVERY DAY (Patient taking differently: Take 20 mg by mouth daily at 6 PM. ) 90 tablet 3  . Blood Glucose Monitoring Suppl (ACCU-CHEK AVIVA PLUS) w/Device KIT Used to check blood glucose 2 times a day or PRN 1 kit 0  . cloNIDine (CATAPRES) 0.2 MG tablet TAKE 1 TABLET (0.2 MG TOTAL) BY MOUTH 3 (THREE) TIMES DAILY. 270 tablet 3  . clopidogrel (PLAVIX) 75 MG tablet TAKE 1 TABLET BY MOUTH EVERY DAY (Patient taking differently: Take 75 mg by mouth daily. ) 90 tablet 3  . [facility]S D3 25 MCG (1000 UT) capsule Take 1,000 Units by mouth daily.    Marland Kitchen doxazosin (CARDURA) 2 MG tablet Take 2 mg by mouth at bedtime.   3  . esomeprazole (NEXIUM) 40 MG capsule TAKE 1 CAPSULE BY MOUTH EVERY DAY (Patient taking differently: Take 40 mg by mouth daily. ) 90 capsule 3  . furosemide (LASIX) 40 MG tablet Take 1 tablet (40 mg total) by mouth daily as needed for fluid or edema. 30 tablet 0  . glipiZIDE (GLUCOTROL XL) 5 MG 24 hr tablet TAKE 1 TABLET BY MOUTH EVERY DAY (Patient taking differently: Take 5 mg by mouth daily with breakfast. ) 90 tablet 1  . glucose blood (ACCU-CHEK AVIVA PLUS) test strip Used to check blood glucose BID or PRN 100 each 12  . hydrocortisone (ANUSOL-HC) 2.5 % rectal cream PLACE 1 APPLICATION RECTALLY 2 TIMES A DAY. (Patient taking differently: Place 1 application rectally daily. ) 30 g 3  . isosorbide-hydrALAZINE (BIDIL) 20-37.5 MG tablet Take 1 tablet by mouth 4 (four) times daily.     Marland Kitchen KLOR-CON M10 10 MEQ tablet Take 10 mEq by mouth daily.     Marland Kitchen lactulose (CHRONULAC) 10 GM/15ML solution TAKE 15 ML BY MOUTH 2 TIMES DAILY AS NEEDED FOR MILD CONSTIPATION. (Patient taking differently: Take 20 g by mouth daily as needed for mild constipation. ) 2838 mL 3  . Lancets (ACCU-CHEK MULTICLIX) lancets Used to check blood glucose BID or PRN 100 each 12  . losartan (COZAAR) 25 MG tablet Take 25 mg by mouth daily.    . metoprolol succinate (TOPROL-XL) 25 MG 24 hr tablet Take 4  tablets (100 mg total) by mouth daily.    Marland Kitchen triamcinolone cream (KENALOG) 0.1 % Apply 1 application topically 2 (two) times daily.     No current facility-administered medications on file prior to visit.    Pulse 63   Temp (!) 97.2 F (36.2 C) (Other (Comment))   Ht 5' 6.5" (1.689 m)   Wt 189 lb (85.7 kg)   SpO2 99%   BMI 30.05 kg/m       Objective:   Physical Exam Vitals and nursing note reviewed.  Constitutional:      Appearance: Normal appearance.  Musculoskeletal:     Right lower leg: Edema present.     Left lower leg: Edema present.  Skin:    General: Skin is warm and dry.     Comments: Toenails are long and thick especially his right great toe.    Neurological:     General: No focal deficit present.     Mental Status: He is alert and oriented to person, place, and time.  Psychiatric:        Mood and Affect: Mood normal.        Behavior: Behavior normal.        Thought Content: Thought content normal.        Judgment: Judgment normal.        Assessment & Plan:  Furred to podiatry for stated problems.  Advised that they would call him to schedule  Dorothyann Peng, NP

## 2019-11-18 ENCOUNTER — Other Ambulatory Visit: Payer: Self-pay | Admitting: *Deleted

## 2019-11-18 DIAGNOSIS — I6529 Occlusion and stenosis of unspecified carotid artery: Secondary | ICD-10-CM

## 2019-11-20 ENCOUNTER — Other Ambulatory Visit: Payer: Self-pay

## 2019-11-20 ENCOUNTER — Ambulatory Visit (HOSPITAL_COMMUNITY)
Admission: RE | Admit: 2019-11-20 | Discharge: 2019-11-20 | Disposition: A | Discharge status: Home / Self Care | Payer: Medicare PPO | Source: Ambulatory Visit | Attending: Vascular Surgery | Admitting: Vascular Surgery

## 2019-11-20 ENCOUNTER — Ambulatory Visit: Payer: Medicare PPO | Admitting: Physician Assistant

## 2019-11-20 VITALS — BP 191/72 | HR 56 | Temp 97.1°F | Ht 66.5 in | Wt 192.1 lb

## 2019-11-20 DIAGNOSIS — I6529 Occlusion and stenosis of unspecified carotid artery: Secondary | ICD-10-CM | POA: Diagnosis not present

## 2019-11-20 DIAGNOSIS — I6521 Occlusion and stenosis of right carotid artery: Secondary | ICD-10-CM | POA: Diagnosis not present

## 2019-11-20 NOTE — Progress Notes (Signed)
Office Note     CC:  follow up Requesting Provider:  Dorothyann Peng, NP  HPI: Austin [name] is a 85 y.o. ([birth year]) male who presents for follow up of carotid stenosis. He is s/p right carotid intracranial angioplasty in October 2001 by Dr. Estanislado Pandy.  He is here today for routine surveillance. He has no new neurological symptoms. He denies any slurred speech, facial drooping, changes in vision, or upper or lower extremities numbness or weakness  At time of last visit on 11/16/17 he was seen by NP and noted to have some occular events. He has not had any recurrence of this. He states he was scheduled to have cataract surgery but decided to hold off due to hospitalization for CHF and worsening renal function  He does not have any lower extremity symptoms. He denies any claudication, rest pain or non healing wounds. He does have swelling in bilateral lower extremities secondary to fluid overload. This is somewhat improved with Lasix  The pt is on a statin for cholesterol management.  The pt is not on a daily aspirin due to hx of GI bleed.   Other AC:  Plavix The pt is on BB, ARB, Bidil and Lasix for hypertension.   The pt is diabetic.  Tobacco hx: Former smoker, 1976  Past Medical History:  Diagnosis Date  . 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 CAROTID ARTERY STENOSIS 05/10/2010  . CHF (congestive heart failure) ([place])   . DIABETES MELLITUS, TYPE II 09/19/2007  . DISEASE, CEREBROVASCULAR NEC 03/05/2007  . GERD 03/13/2007  . HYPERLIPIDEMIA 03/05/2007  . HYPERTENSION 03/05/2007  . HYPOKALEMIA 11/09/2009  . KNEE PAIN, RIGHT 11/09/2009  . PROSTATE CANCER, HX OF 03/05/2007  . RENAL DISEASE, CHRONIC 02/03/2009    Past Surgical History:  Procedure Laterality Date  . CAROTID ARTERY ANGIOPLASTY Right Oct. 10, 2001  . ESOPHAGOGASTRODUODENOSCOPY (EGD) WITH PROPOFOL N/A 11/04/2016   Procedure: ESOPHAGOGASTRODUODENOSCOPY (EGD) WITH PROPOFOL;  Surgeon: Otis Brace, MD;  Location: [facility];  Service: Gastroenterology;  Laterality: N/A;  . LAPAROSCOPIC APPENDECTOMY  02/20/2012   Procedure: APPENDECTOMY LAPAROSCOPIC;  Surgeon: Stark Klein, MD;  Location: [facility];  Service: General;  Laterality: N/A;  . PROSTATE SURGERY     prostatectomy    Social History   Socioeconomic History  . Marital status: Married    Spouse name: Not on file  . Number of children: Not on file  . Years of education: Not on file  . Highest education level: Not on file  Occupational History  . Not on file  Tobacco Use  . Smoking status: Former Smoker    Types: Cigarettes    Quit date: 08/22/1974    Years since quitting: 45.2  . Smokeless tobacco: Never Used  Substance and Sexual Activity  . Alcohol use: No    Alcohol/week: 0.0 standard drinks  . Drug use: No  . Sexual activity: Not on file  Other Topics Concern  . Not on file  Social History Narrative   Retired - Environmental consultant    Married 51 years       He enjoys traveling    Investment banker, operational of Radio broadcast assistant Strain:   . Difficulty of Paying Living Expenses:   Food Insecurity:   . Worried About Charity fundraiser in the Last Year:   . Arboriculturist in the Last Year:   Transportation Needs:   . Film/video editor (Medical):   Marland Kitchen Lack of Transportation (Non-Medical):  Physical Activity:   . Days of Exercise per Week:   . Minutes of Exercise per Session:   Stress:   . Feeling of Stress :   Social Connections:   . Frequency of Communication with Friends and Family:   . Frequency of Social Gatherings with Friends and Family:   . Attends Religious Services:   . Active Member of Clubs or Organizations:   . Attends Archivist Meetings:   Marland Kitchen Marital Status:   Intimate Partner Violence:   . Fear of Current or Ex-Partner:   . Emotionally Abused:   Marland Kitchen Physically Abused:   . Sexually Abused:     Family History  Problem Relation Age of Onset  . Hypertension Mother   . Cancer Father         Mesothelioma   . Stomach cancer Brother   . Cancer Brother   . Cancer - Cervical Brother   . Cancer Brother   . Diabetes Brother   . Esophageal cancer Neg Hx   . Colon cancer Neg Hx   . Pancreatic cancer Neg Hx     Current Outpatient Medications  Medication Sig Dispense Refill  . acetaminophen (TYLENOL) 325 MG tablet Take 650 mg by mouth every 6 (six) hours as needed for mild pain or moderate pain.    Marland Kitchen amLODipine (NORVASC) 10 MG tablet TAKE 1 TABLET BY MOUTH EVERY DAY (Patient taking differently: Take 10 mg by mouth daily. ) 90 tablet 3  . atorvastatin (LIPITOR) 20 MG tablet TAKE 1 TABLET BY MOUTH EVERY DAY (Patient taking differently: Take 20 mg by mouth daily at 6 PM. ) 90 tablet 3  . Blood Glucose Monitoring Suppl (ACCU-CHEK AVIVA PLUS) w/Device KIT Used to check blood glucose 2 times a day or PRN 1 kit 0  . cloNIDine (CATAPRES) 0.2 MG tablet TAKE 1 TABLET (0.2 MG TOTAL) BY MOUTH 3 (THREE) TIMES DAILY. 270 tablet 3  . clopidogrel (PLAVIX) 75 MG tablet TAKE 1 TABLET BY MOUTH EVERY DAY (Patient taking differently: Take 75 mg by mouth daily. ) 90 tablet 3  . [facility] D3 25 MCG (1000 UT) capsule Take 1,000 Units by mouth daily.    Marland Kitchen doxazosin (CARDURA) 2 MG tablet Take 2 mg by mouth at bedtime.   3  . esomeprazole (NEXIUM) 40 MG capsule TAKE 1 CAPSULE BY MOUTH EVERY DAY (Patient taking differently: Take 40 mg by mouth daily. ) 90 capsule 3  . glipiZIDE (GLUCOTROL XL) 5 MG 24 hr tablet TAKE 1 TABLET BY MOUTH EVERY DAY (Patient taking differently: Take 5 mg by mouth daily with breakfast. ) 90 tablet 1  . glucose blood (ACCU-CHEK AVIVA PLUS) test strip Used to check blood glucose BID or PRN 100 each 12  . hydrocortisone (ANUSOL-HC) 2.5 % rectal cream PLACE 1 APPLICATION RECTALLY 2 TIMES A DAY. (Patient taking differently: Place 1 application rectally daily. ) 30 g 3  . isosorbide-hydrALAZINE (BIDIL) 20-37.5 MG tablet Take 1 tablet by mouth 4 (four) times daily.     Marland Kitchen KLOR-CON M10 10 MEQ tablet Take  10 mEq by mouth daily.    Marland Kitchen lactulose (CHRONULAC) 10 GM/15ML solution TAKE 15 ML BY MOUTH 2 TIMES DAILY AS NEEDED FOR MILD CONSTIPATION. (Patient taking differently: Take 20 g by mouth daily as needed for mild constipation. ) 2838 mL 3  . Lancets (ACCU-CHEK MULTICLIX) lancets Used to check blood glucose BID or PRN 100 each 12  . losartan (COZAAR) 25 MG tablet Take 25 mg by mouth  daily.    . metoprolol succinate (TOPROL-XL) 25 MG 24 hr tablet Take 4 tablets (100 mg total) by mouth daily.    Marland Kitchen triamcinolone cream (KENALOG) 0.1 % Apply 1 application topically 2 (two) times daily.    . furosemide (LASIX) 40 MG tablet Take 1 tablet (40 mg total) by mouth daily as needed for fluid or edema. 30 tablet 0   No current facility-administered medications for this visit.    Allergies  Allergen Reactions  . Aspirin Other (See Comments)    High doses causes stomach ulcer and bleeding     REVIEW OF SYSTEMS:  Review of Systems  Constitutional: Negative for chills, fever and malaise/fatigue.  HENT: Negative for congestion and sore throat.   Eyes: Negative for blurred vision.  Respiratory: Positive for shortness of breath and wheezing. Negative for cough.   Cardiovascular: Positive for orthopnea and leg swelling. Negative for chest pain, palpitations and claudication.  Gastrointestinal: Negative for abdominal pain, constipation, diarrhea, heartburn, nausea and vomiting.  Genitourinary: Negative for dysuria.  Musculoskeletal: Negative for joint pain.  Neurological: Negative for dizziness, speech change, weakness and headaches.     PHYSICAL EXAMINATION:  Vitals:   11/20/19 0834 11/20/19 0838  BP: (!) 175/71 (!) 191/72  Pulse: (!) 57 (!) 56  Temp: (!) 97.1 F (36.2 C)   SpO2: 96% 96%  Weight: 192 lb 1.6 oz (87.1 kg)   Height: 5' 6.5" (1.689 m)     General:  WDWN in NAD; vital signs documented above Gait: Not observed HENT: WNL, normocephalic Pulmonary: normal non-labored breathing , without  Rales, rhonchi,  wheezing Cardiac: regular HR, without  Murmurs without carotid bruit Abdomen: soft, NT, no masses Skin: without rashes Vascular Exam/Pulses:2+ femoral pulses bilaterally, not able to palpate popliteal pulses, palpable DP/PT pulses bilaterally Extremities: without ischemic changes, without Gangrene , without cellulitis; without open wounds; bilateral feet and ankles with pitting edema Musculoskeletal: no muscle wasting or atrophy  Neurologic: A&O X 3;  No focal weakness or paresthesias are detected Psychiatric:  The pt has Normal affect.   Non-Invasive Vascular Imaging:   11/20/19 VAS US Carotid Duplex bilateral Summary:  Right Carotid: Velocities in the right ICA are consistent with a 1-39% stenosis.  Left Carotid: Velocities in the left ICA are consistent with a 1-39% stenosis.  Vertebrals: Bilateral vertebral arteries demonstrate antegrade flow.  Subclavians: Normal flow hemodynamics were seen in bilateral subclavian arteries.    ASSESSMENT/PLAN:: 85 y.o. male here for follow up for intracranial carotid stenosis s/p Angioplasty in 2001 by Dr. Estanislado Pandy. His routine surveillance non invasive study today shows 1-39% carotid stenosis bilaterally. He has had no new neurological events since last visit.  - he remains hypertensive despite 4 medications which likely has renal component to it - he will continue to follow up with Nephrologist Dr. Royce Macadamia for worsening renal function - he will follow up with [place] for carotid duplex in 2 years - reviewed stroke/tia symptoms and advised him to seek immediate medical care should these symptoms occur  Karoline Caldwell, PA-C [facility] [phone]  Clinic MD:   Dr. Oneida Alar

## 2019-11-21 ENCOUNTER — Other Ambulatory Visit: Payer: Self-pay | Admitting: *Deleted

## 2019-11-21 DIAGNOSIS — I6521 Occlusion and stenosis of right carotid artery: Secondary | ICD-10-CM

## 2019-11-24 ENCOUNTER — Other Ambulatory Visit: Payer: Self-pay | Admitting: Adult Health

## 2019-11-26 NOTE — Telephone Encounter (Signed)
Needs A1C check

## 2019-11-26 NOTE — Telephone Encounter (Signed)
Left a message for a return call.

## 2019-11-28 ENCOUNTER — Encounter (HOSPITAL_COMMUNITY): Payer: Self-pay | Admitting: *Deleted

## 2019-11-28 ENCOUNTER — Emergency Department (HOSPITAL_COMMUNITY): Payer: Medicare PPO

## 2019-11-28 ENCOUNTER — Emergency Department (HOSPITAL_COMMUNITY)
Admission: EM | Admit: 2019-11-28 | Discharge: 2019-11-28 | Disposition: A | Discharge status: Home / Self Care | Payer: Medicare PPO | Attending: Emergency Medicine | Admitting: Emergency Medicine

## 2019-11-28 ENCOUNTER — Other Ambulatory Visit: Payer: Self-pay

## 2019-11-28 DIAGNOSIS — Z7901 Long term (current) use of anticoagulants: Secondary | ICD-10-CM | POA: Diagnosis not present

## 2019-11-28 DIAGNOSIS — I5023 Acute on chronic systolic (congestive) heart failure: Secondary | ICD-10-CM | POA: Insufficient documentation

## 2019-11-28 DIAGNOSIS — Z79899 Other long term (current) drug therapy: Secondary | ICD-10-CM | POA: Diagnosis not present

## 2019-11-28 DIAGNOSIS — R0602 Shortness of breath: Secondary | ICD-10-CM | POA: Diagnosis present

## 2019-11-28 DIAGNOSIS — I130 Hypertensive heart and chronic kidney disease with heart failure and stage 1 through stage 4 chronic kidney disease, or unspecified chronic kidney disease: Secondary | ICD-10-CM | POA: Diagnosis not present

## 2019-11-28 DIAGNOSIS — E119 Type 2 diabetes mellitus without complications: Secondary | ICD-10-CM | POA: Insufficient documentation

## 2019-11-28 DIAGNOSIS — Z7984 Long term (current) use of oral hypoglycemic drugs: Secondary | ICD-10-CM | POA: Insufficient documentation

## 2019-11-28 DIAGNOSIS — I509 Heart failure, unspecified: Secondary | ICD-10-CM

## 2019-11-28 DIAGNOSIS — N184 Chronic kidney disease, stage 4 (severe): Secondary | ICD-10-CM | POA: Diagnosis not present

## 2019-11-28 DIAGNOSIS — Z87891 Personal history of nicotine dependence: Secondary | ICD-10-CM | POA: Diagnosis not present

## 2019-11-28 LAB — CBC
HCT: 33.3 % — ABNORMAL LOW (ref 39.0–52.0)
Hemoglobin: 10.5 g/dL — ABNORMAL LOW (ref 13.0–17.0)
MCH: 26.1 pg (ref 26.0–34.0)
MCHC: 31.5 g/dL (ref 30.0–36.0)
MCV: 82.8 fL (ref 80.0–100.0)
Platelets: 247 10*3/uL (ref 150–400)
RBC: 4.02 MIL/uL — ABNORMAL LOW (ref 4.22–5.81)
RDW: 15.5 % (ref 11.5–15.5)
WBC: 6.6 10*3/uL (ref 4.0–10.5)
nRBC: 0 % (ref 0.0–0.2)

## 2019-11-28 LAB — BASIC METABOLIC PANEL
Anion gap: 9 (ref 5–15)
BUN: 24 mg/dL — ABNORMAL HIGH (ref 8–23)
CO2: 23 mmol/L (ref 22–32)
Calcium: 9 mg/dL (ref 8.9–10.3)
Chloride: 110 mmol/L (ref 98–111)
Creatinine, Ser: 2.91 mg/dL — ABNORMAL HIGH (ref 0.61–1.24)
GFR calc Af Amer: 22 mL/min — ABNORMAL LOW (ref >60)
GFR calc non Af Amer: 19 mL/min — ABNORMAL LOW (ref >60)
Glucose, Bld: 143 mg/dL — ABNORMAL HIGH (ref 70–99)
Potassium: 3.6 mmol/L (ref 3.5–5.1)
Sodium: 142 mmol/L (ref 135–145)

## 2019-11-28 LAB — TROPONIN I (HIGH SENSITIVITY)
Troponin I (High Sensitivity): 23 ng/L — ABNORMAL HIGH (ref <18)
Troponin I (High Sensitivity): 23 ng/L — ABNORMAL HIGH (ref <18)

## 2019-11-28 LAB — BRAIN NATRIURETIC PEPTIDE: B Natriuretic Peptide: 1296.2 pg/mL — ABNORMAL HIGH (ref 0.0–100.0)

## 2019-11-28 MED ORDER — FUROSEMIDE 10 MG/ML IJ SOLN
40.0000 mg | Freq: Once | INTRAMUSCULAR | Status: AC
  Administered 2019-11-28: 40 mg via INTRAVENOUS

## 2019-11-28 MED ORDER — CLONIDINE HCL 0.2 MG PO TABS
0.2000 mg | ORAL_TABLET | Freq: Three times a day (TID) | ORAL | Status: DC
  Administered 2019-11-28: 10:00:00 0.2 mg via ORAL
  Filled 2019-11-28: qty 1

## 2019-11-28 MED ORDER — FUROSEMIDE 10 MG/ML IJ SOLN
40.0000 mg | Freq: Once | INTRAMUSCULAR | Status: DC
  Filled 2019-11-28: qty 4

## 2019-11-28 MED ORDER — AMLODIPINE BESYLATE 5 MG PO TABS
10.0000 mg | ORAL_TABLET | Freq: Every day | ORAL | Status: DC
  Administered 2019-11-28: 10:00:00 10 mg via ORAL
  Filled 2019-11-28: qty 2

## 2019-11-28 MED ORDER — FUROSEMIDE 20 MG PO TABS: 40.0000 mg | ORAL_TABLET | Freq: Once | ORAL | Status: DC

## 2019-11-28 MED ORDER — ISOSORB DINITRATE-HYDRALAZINE 20-37.5 MG PO TABS
1.0000 | ORAL_TABLET | Freq: Four times a day (QID) | ORAL | Status: DC
  Administered 2019-11-28: 1 via ORAL
  Filled 2019-11-28 (×2): qty 1

## 2019-11-28 MED ORDER — SODIUM CHLORIDE 0.9% FLUSH: 3.0000 mL | Freq: Once | INTRAVENOUS | Status: DC

## 2019-11-28 MED ORDER — METOPROLOL SUCCINATE ER 100 MG PO TB24
100.0000 mg | ORAL_TABLET | Freq: Every day | ORAL | Status: DC
  Administered 2019-11-28: 100 mg via ORAL
  Filled 2019-11-28: qty 1

## 2019-11-28 NOTE — Plan of Care (Signed)
[facility]  Consult Note  Chief Complaint: SHOB  History of Present Illness: Austin [name] is a 85 y.o male with HFpEF, resistant HTN, CKD Stage IIIb, and prior CVA who presented tot he ED with 2 weeks of progressive DOE. Prior to the last 2 weeks he was a NYHA class II based on symptoms; however, he has now progressive to a NYHA class III. He become dyspneic with minimal movement around the house. He also endorses orthopnea and progressive LE edema. He has increased his home lasix dose to try to help. He does not weight himself consistently, but thinks his weight is up 7-10lbs. He denies chest pain, palpitations, abdominal pain, fevers, N/V.   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 They have no pets and he feels safe. He denies a history of or current use of illicit substances. He has all his medications except for furosemide.   In the ED he was found to be hemodynamically stable but hypervolemic on PE. Renal function is stable compared to baseline with a GFR of 19. BNP elevated at 1296. CXR with pulmonary edema and kerley B lines. ECG is without ischemia. He has received 40 mg IV furosemide and had 600 cc off UOP within 1 hour.   Meds:  No current facility-administered medications on file prior to encounter.   Current Outpatient Medications on File Prior to Encounter  Medication Sig Dispense Refill  . acetaminophen (TYLENOL) 325 MG tablet Take 650 mg by mouth every 6 (six) hours as needed for mild pain or moderate pain.    Marland Kitchen amLODipine (NORVASC) 10 MG tablet TAKE 1 TABLET BY MOUTH EVERY DAY (Patient taking differently: Take 10 mg by mouth daily. ) 90 tablet 3  . atorvastatin (LIPITOR) 20 MG tablet TAKE 1 TABLET BY MOUTH EVERY DAY (Patient taking differently: Take 20 mg by mouth daily at 6 PM. ) 90 tablet 3  . Blood Glucose Monitoring Suppl (ACCU-CHEK AVIVA PLUS) w/Device KIT Used to check blood glucose 2 times a day or PRN 1 kit 0  . cloNIDine  (CATAPRES) 0.2 MG tablet TAKE 1 TABLET (0.2 MG TOTAL) BY MOUTH 3 (THREE) TIMES DAILY. 270 tablet 3  . clopidogrel (PLAVIX) 75 MG tablet TAKE 1 TABLET BY MOUTH EVERY DAY (Patient taking differently: Take 75 mg by mouth daily. ) 90 tablet 3  . CVS D3 25 MCG (1000 UT) capsule Take 1,000 Units by mouth daily.    Marland Kitchen doxazosin (CARDURA) 2 MG tablet Take 2 mg by mouth at bedtime.   3  . esomeprazole (NEXIUM) 40 MG capsule TAKE 1 CAPSULE BY MOUTH EVERY DAY (Patient taking differently: Take 40 mg by mouth daily. ) 90 capsule 3  . furosemide (LASIX) 40 MG tablet Take 1 tablet (40 mg total) by mouth daily as needed for fluid or edema. 30 tablet 0  . glipiZIDE (GLUCOTROL XL) 5 MG 24 hr tablet TAKE 1 TABLET BY MOUTH EVERY DAY (Patient taking differently: Take 5 mg by mouth daily with breakfast. ) 90 tablet 1  . glucose blood (ACCU-CHEK AVIVA PLUS) test strip Used to check blood glucose BID or PRN 100 each 12  . hydrocortisone (ANUSOL-HC) 2.5 % rectal cream PLACE 1 APPLICATION RECTALLY 2 TIMES A DAY. (Patient taking differently: Place 1 application rectally daily. ) 30 g 3  . isosorbide-hydrALAZINE (BIDIL) 20-37.5 MG tablet Take 1 tablet by mouth 4 (four) times daily.     Marland Kitchen KLOR-CON M10 10 MEQ  tablet Take 10 mEq by mouth daily.    Marland Kitchen lactulose (CHRONULAC) 10 GM/15ML solution TAKE 15 ML BY MOUTH 2 TIMES DAILY AS NEEDED FOR MILD CONSTIPATION. (Patient taking differently: Take 20 g by mouth daily as needed for mild constipation. ) 2838 mL 3  . Lancets (ACCU-CHEK MULTICLIX) lancets Used to check blood glucose BID or PRN 100 each 12  . losartan (COZAAR) 25 MG tablet Take 25 mg by mouth daily.    . metoprolol succinate (TOPROL-XL) 25 MG 24 hr tablet Take 4 tablets (100 mg total) by mouth daily.    Marland Kitchen triamcinolone cream (KENALOG) 0.1 % Apply 1 application topically 2 (two) times daily.     Physical Exam: Blood pressure (!) 142/96, pulse 81, temperature 98.1 F (36.7 C), temperature source Oral, resp. rate 19, height 5'  5.5" (1.664 m), weight 87.1 kg, SpO2 95 %.  General: Well nourished male in no acute distress HENT: Normocephalic, atraumatic, moist mucus membranes Pulm: Good air movement with bilateral crackles  CV: RRR, no murmurs, no rubs, +JVD  Abdomen: Active bowel sounds, soft, non-distended, no tenderness to palpation  Extremities: Pulses palpable in all extremities, 2-3+ pitting edema to above the knees bilaterally   Skin: Warm and dry  Neuro: Alert and oriented x 3  Clinical Screening: (ALL ANSWERS MUST BE NO)  Based on current presentation is the patient likely to require: advanced diagnostics, advanced imaging (CT, MRI, nuclear stress testing), cardiac catheterization, EGD/colonoscopy, or lab monitoring not amendable to home monitoring (troponin, >q12 hour labs): NO.  Based on current presentation is the patient is likely to require: mechanical ventilation (invasive and noninvasive, history of intubation) and/or vasoactive medications: NO.  Based on current presentation is the patient likely to require a surgical or IR procedure including but not limited to intraabdominal abscess drainage, percutaneous nephrostomy tube placement, thoracentesis for parapneumonic effusion, surgical wound debridement: NO.  Based on current presentation is the patient is likely to require: daily specialty consultation, blood transfusions, respiratory isolation/airborne precautions, active adjustments of opiates or IV pain medications: NO.  Does the patient have barriers that would make it unsafe to provide care in the home including but not limited to severe AMS, active substance use disorder, history of or high risk of noncompliance with primary treatment plan: NO.  Has the patient ever been intubated or do they have a new tracheostomy: NO.  Does the patient have an unstable arrhythmia: NO.  Is hemodialysis likely to be required (i.e. already on HD or newly anuric/sever ATN): NO.  Is there risk for inability to  obtain IV access: NO.]  Social Screening: (ALL QUESTIONS MUST BE YES) Does the patient have a home recovery environment? YES.  Is the patient's home recovery environment in an eligible geography [facility])? YES.  Does the patient's home have water, electricity, bathroom, heat/ac, refrigerator? YES.  Does the patient feel safe at home? YES.  Are family/caregivers willing to participate, as needed, while the patient participates [facility].  Is there a person in the home (patient or other) willing/able to take vital signs and answer phone calls? YES.  Is the patient willing to put pets in a secure area while [facility] and affiliated staff are in the home? YES.  Is patient willing/able to participate in the [facility] (this includes [facility] affiliated staff entering the home, and associated services)? YES.  Is the patient/patient's HCP willing/able to sign consent? YES.  Assessment / Plan:  Based  on the HPI and information obtained the patient is a candidate for the [facility]. Consent has been signed and the patient has been provided with a copy.   Patient to be enrolled in the [facility] program for acute on chronic heart failure requiring IV diuretics. He had a great response to 69m IV furosemide.   [facility] has been notified and will present to the patient's house within 12 hours.   Home health / DME needs: None identified  Medication needs: None identified  Other needs: None identified  Patient's contact information:  Phone: [phone] [phone]: [address] [place] [address] Please do not hesitate to call with questions/concerns.   HIna Homes MD 11/28/2019, 8:47 AM  Pager: [phone]

## 2019-11-28 NOTE — Discharge Instructions (Signed)
As discussed, you have been enrolled in the heart failure treatment at home program. You should receive a phone call within 24 hours. If you do not, please call the number I have provided. Take your medications as prescribed. Return to the ER for new or worsening symptoms.

## 2019-11-28 NOTE — ED Triage Notes (Signed)
The pt is c/o difficulty breathing all week exertional sob outside  No temp sometimes a productive cough  No distress

## 2019-11-28 NOTE — ED Notes (Signed)
Pt discharged at this time. Discharge instructions reviewed, opportunity to ask questions provided, pt verbalizes understanding of discharge instructions.   

## 2019-11-28 NOTE — ED Provider Notes (Signed)
[facility] EMERGENCY DEPARTMENT Provider Note   CSN: [id number] Arrival date & time: 11/28/19  0515     History No chief complaint on file.   Austin [name] is a 85 y.o. male with a past medical history significant for congestive heart failure, CKD stage IV, hyperlipidemia, hypertension, and history of prostate cancer who presents to the ED due to acute on chronic shortness of breath, mostly with exertion and while lying flat.  Patient notes shortness of breath has progressively gotten worse over the past few weeks.  Admits to an intermittent productive cough, but notes he is unable to cough up any phlegm.  Denies fever and chills.  He received his COVID vaccine 3 to 4 weeks ago.  Denies sick contacts and known Covid exposures.  Patient notes shortness of breath is worse when lying flat and with exertion.  He also admits to associated worsening lower extremity edema and wheeze.  Quit tobacco use in 1976.  No history of COPD.  Is currently on Lasix which she has been compliant with.  Chart reviewed.  Last echocardiogram was 03/28/2019 which demonstrated an EF of 55 to 60%.  Patient was recently admitted to the hospital on 08/04/2019 for exacerbation of congestive heart failure.  Patient denies chest pain, abdominal pain, nausea, vomiting, and diarrhea.  Denies history of blood clots, recent surgeries, recent long immobilizations, and hormonal treatments.  History obtained from patient and past medical records. No interpreter used during encounter.      Past Medical History:  Diagnosis Date  . 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 CAROTID ARTERY STENOSIS 05/10/2010  . CHF (congestive heart failure) ([place])   . DIABETES MELLITUS, TYPE II 09/19/2007  . DISEASE, CEREBROVASCULAR NEC 03/05/2007  . GERD 03/13/2007  . HYPERLIPIDEMIA 03/05/2007  . HYPERTENSION 03/05/2007  . HYPOKALEMIA 11/09/2009  . KNEE PAIN, RIGHT 11/09/2009  . PROSTATE CANCER, HX OF 03/05/2007  . RENAL DISEASE,  CHRONIC 02/03/2009    Patient Active Problem List   Diagnosis Date Noted  . Acute exacerbation of CHF (congestive heart failure) ([place]) 08/04/2019  . CKD (chronic kidney disease), stage IV ([place]) 08/04/2019  . Elevated troponin 08/04/2019  . Hypokalemia 08/04/2019  . Hypertensive urgency 03/28/2019  . CKD (chronic kidney disease), stage III 03/28/2019  . 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 Gastrointestinal hemorrhage with melena   . Melena 11/03/2016  . Carotid artery stenosis 05/10/2010  . Diabetes mellitus with renal complications ([place]) 88/32/5498  . Iron deficiency anemia due to chronic blood loss 03/13/2007  . GERD 03/13/2007  . Dyslipidemia 03/05/2007  . Essential hypertension 03/05/2007  . DISEASE, CEREBROVASCULAR NEC 03/05/2007  . PROSTATE CANCER, HX OF 03/05/2007    Past Surgical History:  Procedure Laterality Date  . CAROTID ARTERY ANGIOPLASTY Right Oct. 10, 2001  . ESOPHAGOGASTRODUODENOSCOPY (EGD) WITH PROPOFOL N/A 11/04/2016   Procedure: ESOPHAGOGASTRODUODENOSCOPY (EGD) WITH PROPOFOL;  Surgeon: Otis Brace, MD;  Location: [facility];  Service: Gastroenterology;  Laterality: N/A;  . LAPAROSCOPIC APPENDECTOMY  02/20/2012   Procedure: APPENDECTOMY LAPAROSCOPIC;  Surgeon: Stark Klein, MD;  Location: [facility] OR;  Service: General;  Laterality: N/A;  . PROSTATE SURGERY     prostatectomy       Family History  Problem Relation Age of Onset  . Hypertension Mother   . Cancer Father        Mesothelioma   . Stomach cancer Brother   . Cancer Brother   . Cancer - Cervical Brother   .  Cancer Brother   . Diabetes Brother   . Esophageal cancer Neg Hx   . Colon cancer Neg Hx   . Pancreatic cancer Neg Hx     Social History   Tobacco Use  . Smoking status: Former Smoker    Types: Cigarettes    Quit date: 08/22/1974    Years since quitting: 45.2  . Smokeless tobacco: Never Used  Substance Use Topics  . Alcohol use: No    Alcohol/week: 0.0  standard drinks  . Drug use: No    Home Medications Prior to Admission medications   Medication Sig Start Date End Date Taking? Authorizing Provider  acetaminophen (TYLENOL) 325 MG tablet Take 650 mg by mouth every 6 (six) hours as needed for mild pain or moderate pain.    [provider]  amLODipine (NORVASC) 10 MG tablet TAKE 1 TABLET BY MOUTH EVERY DAY Patient taking differently: Take 10 mg by mouth daily.  07/10/19   Nafziger, Tommi Rumps, NP  atorvastatin (LIPITOR) 20 MG tablet TAKE 1 TABLET BY MOUTH EVERY DAY Patient taking differently: Take 20 mg by mouth daily at 6 PM.  07/10/19   Nafziger, Tommi Rumps, NP  Blood Glucose Monitoring Suppl (ACCU-CHEK AVIVA PLUS) w/Device KIT Used to check blood glucose 2 times a day or PRN 09/04/19   Nafziger, Tommi Rumps, NP  cloNIDine (CATAPRES) 0.2 MG tablet TAKE 1 TABLET (0.2 MG TOTAL) BY MOUTH 3 (THREE) TIMES DAILY. 05/01/19   Nafziger, Tommi Rumps, NP  clopidogrel (PLAVIX) 75 MG tablet TAKE 1 TABLET BY MOUTH EVERY DAY Patient taking differently: Take 75 mg by mouth daily.  05/31/19   Nafziger, Tommi Rumps, NP  [employer] D3 25 MCG (1000 UT) capsule Take 1,000 Units by mouth daily. 05/07/19   [provider]  doxazosin (CARDURA) 2 MG tablet Take 2 mg by mouth at bedtime.  12/28/17   [provider]  esomeprazole (NEXIUM) 40 MG capsule TAKE 1 CAPSULE BY MOUTH EVERY DAY Patient taking differently: Take 40 mg by mouth daily.  06/04/19   Nafziger, Tommi Rumps, NP  furosemide (LASIX) 40 MG tablet Take 1 tablet (40 mg total) by mouth daily as needed for fluid or edema. 08/06/19 09/05/19  Amin, Ankit Chirag, MD  glipiZIDE (GLUCOTROL XL) 5 MG 24 hr tablet TAKE 1 TABLET BY MOUTH EVERY DAY Patient taking differently: Take 5 mg by mouth daily with breakfast.  06/05/19   Nafziger, Tommi Rumps, NP  glucose blood (ACCU-CHEK AVIVA PLUS) test strip Used to check blood glucose BID or PRN 09/04/19   Nafziger, Tommi Rumps, NP  hydrocortisone (ANUSOL-HC) 2.5 % rectal cream PLACE 1 APPLICATION RECTALLY 2 TIMES A  DAY. Patient taking differently: Place 1 application rectally daily.  06/07/19   Nafziger, Tommi Rumps, NP  isosorbide-hydrALAZINE (BIDIL) 20-37.5 MG tablet Take 1 tablet by mouth 4 (four) times daily.     [provider]  KLOR-CON M10 10 MEQ tablet Take 10 mEq by mouth daily. 07/25/19   [provider]  lactulose (CHRONULAC) 10 GM/15ML solution TAKE 15 ML BY MOUTH 2 TIMES DAILY AS NEEDED FOR MILD CONSTIPATION. Patient taking differently: Take 20 g by mouth daily as needed for mild constipation.  05/28/19   Nafziger, Tommi Rumps, NP  Lancets (ACCU-CHEK MULTICLIX) lancets Used to check blood glucose BID or PRN 09/04/19   Nafziger, Tommi Rumps, NP  losartan (COZAAR) 25 MG tablet Take 25 mg by mouth daily. 08/02/19   [provider]  metoprolol succinate (TOPROL-XL) 25 MG 24 hr tablet Take 4 tablets (100 mg total) by mouth daily. 03/29/19  Mariel Aloe, MD  triamcinolone cream (KENALOG) 0.1 % Apply 1 application topically 2 (two) times daily. 07/05/19   [provider]    Allergies    Aspirin  Review of Systems   Review of Systems  Constitutional: Negative for chills and fever.  Respiratory: Positive for cough and shortness of breath.   Cardiovascular: Positive for leg swelling. Negative for chest pain.  Gastrointestinal: Negative for abdominal pain, diarrhea, nausea and vomiting.  Genitourinary: Negative for dysuria.  Neurological: Negative for headaches.  All other systems reviewed and are negative.   Physical Exam Updated Vital Signs BP (!) 142/96   Pulse 81   Temp 98.1 F (36.7 C) (Oral)   Resp 19   Ht 5' 5.5" (1.664 m)   Wt 87.1 kg   SpO2 95%   BMI 31.47 kg/m   Physical Exam Vitals and nursing note reviewed.  Constitutional:      General: He is not in acute distress.    Appearance: He is not toxic-appearing.  HENT:     Head: Normocephalic.  Eyes:     Pupils: Pupils are equal, round, and reactive to light.  Cardiovascular:     Rate and Rhythm: Normal  rate and regular rhythm.     Pulses: Normal pulses.     Heart sounds: Normal heart sounds. No murmur. No friction rub. No gallop.   Pulmonary:     Effort: Pulmonary effort is normal.     Breath sounds: Normal breath sounds.     Comments: Respirations equal and unlabored, patient able to speak in full sentences, lungs clear to auscultation bilaterally Abdominal:     General: Abdomen is flat. Bowel sounds are normal. There is no distension.     Palpations: Abdomen is soft.     Tenderness: There is no abdominal tenderness. There is no guarding or rebound.  Musculoskeletal:     Cervical back: Neck supple.     Comments: 2+ pitting edema bilaterally.  Skin:    General: Skin is warm and dry.  Neurological:     General: No focal deficit present.     Mental Status: He is alert.  Psychiatric:        Mood and Affect: Mood normal.        Behavior: Behavior normal.     ED Results / Procedures / Treatments   Labs (all labs ordered are listed, but only abnormal results are displayed) Labs Reviewed  BASIC METABOLIC PANEL - Abnormal; Notable for the following components:      Result Value   Glucose, Bld 143 (*)    BUN 24 (*)    Creatinine, Ser 2.91 (*)    GFR calc non Af Amer 19 (*)    GFR calc Af Amer 22 (*)    All other components within normal limits  CBC - Abnormal; Notable for the following components:   RBC 4.02 (*)    Hemoglobin 10.5 (*)    HCT 33.3 (*)    All other components within normal limits  BRAIN NATRIURETIC PEPTIDE - Abnormal; Notable for the following components:   B Natriuretic Peptide 1,296.2 (*)    All other components within normal limits  TROPONIN I (HIGH SENSITIVITY) - Abnormal; Notable for the following components:   Troponin I (High Sensitivity) 23 (*)    All other components within normal limits  TROPONIN I (HIGH SENSITIVITY) - Abnormal; Notable for the following components:   Troponin I (High Sensitivity) 23 (*)    All other components  within normal limits      EKG EKG Interpretation  Date/Time:  Thursday November 28 2019 05:28:44 EDT Ventricular Rate:  66 PR Interval:  268 QRS Duration: 90 QT Interval:  378 QTC Calculation: 396 R Axis:   -6 Text Interpretation: Sinus rhythm with 1st degree A-V block with Premature atrial complexes Moderate voltage criteria for LVH, may be normal variant ( R in aVL , Cornell product ) Nonspecific ST and T wave abnormality Abnormal ECG Confirmed by Merrily Pew [phone]) on 11/28/2019 6:53:27 AM   Radiology DG Chest 2 View  Result Date: 11/28/2019 CLINICAL DATA:  Shortness of breath. EXAM: CHEST - 2 VIEW COMPARISON:  Chest x-ray 08/04/2019 FINDINGS: Findings the heart is enlarged but stable. Mild tortuosity and calcification of the thoracic aorta. Chronic bibasilar scarring type changes without definite acute overlying pulmonary findings. No pleural effusion. The bony thorax is intact. IMPRESSION: 1. Stable cardiac enlargement. 2. Chronic bibasilar scarring type changes without definite acute overlying pulmonary process. Electronically Signed   By: Marijo Sanes M.D.   On: 11/28/2019 06:09    Procedures Procedures (including critical care time)  Medications Ordered in ED Medications  sodium chloride flush (NS) 0.9 % injection 3 mL (has no administration in time range)  amLODipine (NORVASC) tablet 10 mg (has no administration in time range)  isosorbide-hydrALAZINE (BIDIL) 20-37.5 MG per tablet 1 tablet (has no administration in time range)  metoprolol succinate (TOPROL-XL) 24 hr tablet 100 mg (has no administration in time range)  cloNIDine (CATAPRES) tablet 0.2 mg (has no administration in time range)  furosemide (LASIX) injection 40 mg (40 mg Intravenous Given 11/28/19 0737)    ED Course  I have reviewed the triage vital signs and the nursing notes.  Pertinent labs & imaging results that were available during my care of the patient were reviewed by me and considered in my medical decision making (see chart  for details).  Clinical Course as of Nov 27 924  Thu Nov 28, 2019  7829 Troponin I (High Sensitivity)(!): 23 [CA]  0636 Creatinine(!): 2.91 [CA]  0636 BUN(!): 24 [CA]  0822 B Natriuretic Peptide(!): 1,296.2 [CA]    Clinical Course User Index [CA] Suzy Bouchard, PA-C   MDM Rules/Calculators/A&P                     85 year old male presents to the ED for evaluation of shortness of breath mostly with exertion and when lying flat.  Has a history of congestive heart failure.  Upon arrival, patient is afebrile, not tachycardic or hypoxic.  BP slightly elevated 203/71.  We will continue to monitor.  Patient no acute distress and nontoxic-appearing.  Physical exam significant for 2+ pitting edema bilaterally.  Lungs clear to auscultation bilaterally.  Suspect symptoms related to acute on chronic congestive heart failure.  CBC, BMP, troponin, BNP, chest x-ray, and EKG ordered.  Chest x-ray personally reviewed which demonstrates: IMPRESSION:  1. Stable cardiac enlargement.  2. Chronic bibasilar scarring type changes without definite acute  overlying pulmonary process.   CBC reassuring with no leukocytosis.  Hemoglobin at 10.5 which appears better than baseline.  Initial troponin 23.  Will obtain delta troponin.  BMP significant for hyperglycemia at 143 with slightly worsening renal function with creatinine at 2.91 and BUN at 24.  EKG personally reviewed which demonstrates normal sinus rhythm with first-degree AV block and nonspecific ST and T wave abnormalities.  7:35 AM Called to bedside by RN due to worsening SOB. Patient admits to  worsening SOB. Lungs with crackles present. He has not taken his dose of lasix today yet. Will give 67m IV lasix. Patient placed on 2L [place] for comfort. His o2 saturation was between 92-94% on RA.   BNP elevated at 1296. Suspect symptoms related to exacerbation of heart failure. [facility] criteria.   Delta troponin flat. No concern for ACS at this time. Troponin chronically elevated. Discussed case with Dr. DRoslynn Amblewho evaluated patient at bedside and agrees with assessment and plan.   Spoke to Dr. HTarri Abernethywho notes patient [facility] Patient enrolled in program. Patient had a good response to IV lasix here in the ED and admits to improvement in symptoms. Will discharge patient with close follow-up and [facility]. Patient given BP medications here in the ED. No concern for hypertensive emergency/urgency at this time. Strict ED precautions discussed with patient. Patient states understanding and agrees to plan. Patient discharged home in no acute distress and stable vitals  Final Clinical Impression(s) / ED Diagnoses Final diagnoses:  Acute on chronic congestive heart failure, unspecified heart failure type ([facility]    Rx / DC Orders ED Discharge Orders    None       AKarie Kirks04/08/21 1028    DLucrezia Starch MD 12/02/19 [phone]

## 2019-11-30 ENCOUNTER — Other Ambulatory Visit
Admission: RE | Admit: 2019-11-30 | Discharge: 2019-11-30 | Disposition: A | Discharge status: Home / Self Care | Payer: Medicare PPO | Source: Ambulatory Visit | Attending: Nurse Practitioner | Admitting: Nurse Practitioner

## 2019-11-30 DIAGNOSIS — I509 Heart failure, unspecified: Secondary | ICD-10-CM | POA: Insufficient documentation

## 2019-11-30 LAB — BASIC METABOLIC PANEL
Anion gap: 8 (ref 5–15)
BUN: 30 mg/dL — ABNORMAL HIGH (ref 8–23)
CO2: 24 mmol/L (ref 22–32)
Calcium: 8.6 mg/dL — ABNORMAL LOW (ref 8.9–10.3)
Chloride: 108 mmol/L (ref 98–111)
Creatinine, Ser: 3.08 mg/dL — ABNORMAL HIGH (ref 0.61–1.24)
GFR calc Af Amer: 21 mL/min — ABNORMAL LOW (ref >60)
GFR calc non Af Amer: 18 mL/min — ABNORMAL LOW (ref >60)
Glucose, Bld: 116 mg/dL — ABNORMAL HIGH (ref 70–99)
Potassium: 4 mmol/L (ref 3.5–5.1)
Sodium: 140 mmol/L (ref 135–145)

## 2019-12-02 LAB — COMPREHENSIVE METABOLIC PANEL
Albumin: 3.8 (ref 3.5–5.0)
Calcium: 9.4 (ref 8.7–10.7)

## 2019-12-02 LAB — BASIC METABOLIC PANEL
BUN: 29 — AB (ref 4–21)
CO2: 23 — AB (ref 13–22)
Chloride: 107 (ref 99–108)
Creatinine: 2.9 — AB (ref 0.6–1.3)
Glucose: 119
Potassium: 3.5 (ref 3.4–5.3)
Sodium: 138 (ref 137–147)

## 2019-12-03 NOTE — Telephone Encounter (Signed)
LEFT A MESSAGE FOR A RETURN CALL

## 2019-12-06 ENCOUNTER — Encounter: Payer: Self-pay | Admitting: Podiatry

## 2019-12-06 ENCOUNTER — Ambulatory Visit: Payer: Medicare PPO | Admitting: Podiatry

## 2019-12-06 ENCOUNTER — Other Ambulatory Visit: Payer: Self-pay

## 2019-12-06 VITALS — BP 166/67 | HR 57 | Temp 97.3°F

## 2019-12-06 DIAGNOSIS — M79674 Pain in right toe(s): Secondary | ICD-10-CM | POA: Diagnosis not present

## 2019-12-06 DIAGNOSIS — E0821 Diabetes mellitus due to underlying condition with diabetic nephropathy: Secondary | ICD-10-CM

## 2019-12-06 DIAGNOSIS — B351 Tinea unguium: Secondary | ICD-10-CM | POA: Diagnosis not present

## 2019-12-06 DIAGNOSIS — M79675 Pain in left toe(s): Secondary | ICD-10-CM | POA: Diagnosis not present

## 2019-12-06 NOTE — Telephone Encounter (Signed)
Pt scheduled for follow up.  30 day supply sent to the pharmacy.

## 2019-12-06 NOTE — Patient Instructions (Addendum)
Diabetes Mellitus and Foot Care Foot care is an important part of your health, especially when you have diabetes. Diabetes may cause you to have problems because of poor blood flow (circulation) to your feet and legs, which can cause your skin to:  Become thinner and drier.  Break more easily.  Heal more slowly.  Peel and crack. You may also have nerve damage (neuropathy) in your legs and feet, causing decreased feeling in them. This means that you may not notice minor injuries to your feet that could lead to more serious problems. Noticing and addressing any potential problems early is the best way to prevent future foot problems. How to care for your feet Foot hygiene  Wash your feet daily with warm water and mild soap. Do not use hot water. Then, pat your feet and the areas between your toes until they are completely dry. Do not soak your feet as this can dry your skin.  Trim your toenails straight across. Do not dig under them or around the cuticle. File the edges of your nails with an emery board or nail file.  Apply a moisturizing lotion or petroleum jelly to the skin on your feet and to dry, brittle toenails. Use lotion that does not contain alcohol and is unscented. Do not apply lotion between your toes. Shoes and socks  Wear clean socks or stockings every day. Make sure they are not too tight. Do not wear knee-high stockings since they may decrease blood flow to your legs.  Wear shoes that fit properly and have enough cushioning. Always look in your shoes before you put them on to be sure there are no objects inside.  To break in new shoes, wear them for just a few hours a day. This prevents injuries on your feet. Wounds, scrapes, corns, and calluses  Check your feet daily for blisters, cuts, bruises, sores, and redness. If you cannot see the bottom of your feet, use a mirror or ask someone for help.  Do not cut corns or calluses or try to remove them with medicine.  If you  find a minor scrape, cut, or break in the skin on your feet, keep it and the skin around it clean and dry. You may clean these areas with mild soap and water. Do not clean the area with peroxide, alcohol, or iodine.  If you have a wound, scrape, corn, or callus on your foot, look at it several times a day to make sure it is healing and not infected. Check for: ? Redness, swelling, or pain. ? Fluid or blood. ? Warmth. ? Pus or a bad smell. General instructions  Do not cross your legs. This may decrease blood flow to your feet.  Do not use heating pads or hot water bottles on your feet. They may burn your skin. If you have lost feeling in your feet or legs, you may not know this is happening until it is too late.  Protect your feet from hot and cold by wearing shoes, such as at the beach or on hot pavement.  Schedule a complete foot exam at least once a year (annually) or more often if you have foot problems. If you have foot problems, report any cuts, sores, or bruises to your health care provider immediately. Contact a health care provider if:  You have a medical condition that increases your risk of infection and you have any cuts, sores, or bruises on your feet.  You have an injury that is not   healing.  You have redness on your legs or feet.  You feel burning or tingling in your legs or feet.  You have pain or cramps in your legs and feet.  Your legs or feet are numb.  Your feet always feel cold.  You have pain around a toenail. Get help right away if:  You have a wound, scrape, corn, or callus on your foot and: ? You have pain, swelling, or redness that gets worse. ? You have fluid or blood coming from the wound, scrape, corn, or callus. ? Your wound, scrape, corn, or callus feels warm to the touch. ? You have pus or a bad smell coming from the wound, scrape, corn, or callus. ? You have a fever. ? You have a red line going up your leg. Summary  Check your feet every day  for cuts, sores, red spots, swelling, and blisters.  Moisturize feet and legs daily.  Wear shoes that fit properly and have enough cushioning.  If you have foot problems, report any cuts, sores, or bruises to your health care provider immediately.  Schedule a complete foot exam at least once a year (annually) or more often if you have foot problems. This information is not intended to replace advice given to you by your health care provider. Make sure you discuss any questions you have with your health care provider. Document Revised: 05/01/2019 Document Reviewed: 09/09/2016 Elsevier Patient Education  2020 [employer]  Peripheral Neuropathy Peripheral neuropathy is a type of nerve damage. It affects nerves that carry signals between the spinal cord and the arms, legs, and the rest of the body (peripheral nerves). It does not affect nerves in the spinal cord or brain. In peripheral neuropathy, one nerve or a group of nerves may be damaged. Peripheral neuropathy is a broad category that includes many specific nerve disorders, like diabetic neuropathy, hereditary neuropathy, and carpal tunnel syndrome. What are the causes? This condition may be caused by:  Diabetes. This is the most common cause of peripheral neuropathy.  Nerve injury.  Pressure or stress on a nerve that lasts a long time.  Lack (deficiency) of B vitamins. This can result from alcoholism, poor diet, or a restricted diet.  Infections.  Autoimmune diseases, such as rheumatoid arthritis and systemic lupus erythematosus.  Nerve diseases that are passed from parent to child (inherited).  Some medicines, such as cancer medicines (chemotherapy).  Poisonous (toxic) substances, such as lead and mercury.  Too little blood flowing to the legs.  Kidney disease.  Thyroid disease. In some cases, the cause of this condition is not known. What are the signs or symptoms? Symptoms of this condition depend on which of your  nerves is damaged. Common symptoms include:  Loss of feeling (numbness) in the feet, hands, or both.  Tingling in the feet, hands, or both.  Burning pain.  Very sensitive skin.  Weakness.  Not being able to move a part of the body (paralysis).  Muscle twitching.  Clumsiness or poor coordination.  Loss of balance.  Not being able to control your bladder.  Feeling dizzy.  Sexual problems. How is this diagnosed? Diagnosing and finding the cause of peripheral neuropathy can be difficult. Your health care provider will take your medical history and do a physical exam. A neurological exam will also be done. This involves checking things that are affected by your brain, spinal cord, and nerves (nervous system). For example, your health care provider will check your reflexes, how you move, and   what you can feel. You may have other tests, such as:  Blood tests.  Electromyogram (EMG) and nerve conduction tests. These tests check nerve function and how well the nerves are controlling the muscles.  Imaging tests, such as CT scans or MRI to rule out other causes of your symptoms.  Removing a small piece of nerve to be examined in a lab (nerve biopsy). This is rare.  Removing and examining a small amount of the fluid that surrounds the brain and spinal cord (lumbar puncture). This is rare. How is this treated? Treatment for this condition may involve:  Treating the underlying cause of the neuropathy, such as diabetes, kidney disease, or vitamin deficiencies.  Stopping medicines that can cause neuropathy, such as chemotherapy.  Medicine to relieve pain. Medicines may include: ? Prescription or over-the-counter pain medicine. ? Antiseizure medicine. ? Antidepressants. ? Pain-relieving patches that are applied to painful areas of skin.  Surgery to relieve pressure on a nerve or to destroy a nerve that is causing pain.  Physical therapy to help improve movement and  balance.  Devices to help you move around (assistive devices). Follow these instructions at home: Medicines  Take over-the-counter and prescription medicines only as told by your health care provider. Do not take any other medicines without first asking your health care provider.  Do not drive or use heavy machinery while taking prescription pain medicine. Lifestyle   Do not use any products that contain nicotine or tobacco, such as cigarettes and e-cigarettes. Smoking keeps blood from reaching damaged nerves. If you need help quitting, ask your health care provider.  Avoid or limit alcohol. Too much alcohol can cause a vitamin B deficiency, and vitamin B is needed for healthy nerves.  Eat a healthy diet. This includes: ? Eating foods that are high in fiber, such as fresh fruits and vegetables, whole grains, and beans. ? Limiting foods that are high in fat and processed sugars, such as fried or sweet foods. General instructions   If you have diabetes, work closely with your health care provider to keep your blood sugar under control.  If you have numbness in your feet: ? Check every day for signs of injury or infection. Watch for redness, warmth, and swelling. ? Wear padded socks and comfortable shoes. These help protect your feet.  Develop a good support system. Living with peripheral neuropathy can be stressful. Consider talking with a mental health specialist or joining a support group.  Use assistive devices and attend physical therapy as told by your health care provider. This may include using a walker or a cane.  Keep all follow-up visits as told by your health care provider. This is important. Contact a health care provider if:  You have new signs or symptoms of peripheral neuropathy.  You are struggling emotionally from dealing with peripheral neuropathy.  Your pain is not well-controlled. Get help right away if:  You have an injury or infection that is not healing  normally.  You develop new weakness in an arm or leg.  You fall frequently. Summary  Peripheral neuropathy is when the nerves in the arms, or legs are damaged, resulting in numbness, weakness, or pain.  There are many causes of peripheral neuropathy, including diabetes, pinched nerves, vitamin deficiencies, autoimmune disease, and hereditary conditions.  Diagnosing and finding the cause of peripheral neuropathy can be difficult. Your health care provider will take your medical history, do a physical exam, and do tests, including blood tests and nerve function tests.    Treatment involves treating the underlying cause of the neuropathy and taking medicines to help control pain. Physical therapy and assistive devices may also help. This information is not intended to replace advice given to you by your health care provider. Make sure you discuss any questions you have with your health care provider. Document Revised: 07/21/2017 Document Reviewed: 10/17/2016 Elsevier Patient Education  2020 Elsevier Inc.  

## 2019-12-06 NOTE — Progress Notes (Signed)
  Subjective:  Patient ID: Austin [name], male    DOB: May 09, 1935,  MRN: [id number]  Chief Complaint  Patient presents with  . Diabetes    A1C  6.7, sugar this morning was 119  . Peripheral Neuropathy  . Leg Swelling    gets worse during the day  . Nail Problem    bilateral thick painful toenails   85 y.o. male returns for the above complaint.  Patient presents with thickened elongated mycotic dystrophic toenails x10.  Pain there is pain associated with ambulation.  There is some curvature to the nail.  Patient is a diabetic with last A1c of 6.7.  He is a well-controlled diabetic.  He denies any other acute complaints.  He denies any neuropathic related pain.  He denies any other musculoskeletal pain.  He would like to have the toenails debrided down as is causing him pain to ambulate.  He has not seen anyone else prior to seeing me.  Objective:   Vitals:   12/06/19 0805  BP: (!) 166/67  Pulse: (!) 57  Temp: (!) 97.3 F (36.3 C)   Podiatric Exam: Vascular: dorsalis pedis and posterior tibial pulses are palpable bilateral. Capillary return is immediate. Temperature gradient is WNL. Skin turgor WNL  Sensorium: Normal Semmes Weinstein monofilament test. Normal tactile sensation bilaterally. Nail Exam: Pt has thick disfigured discolored nails with subungual debris noted bilateral entire nail hallux through fifth toenails Ulcer Exam: There is no evidence of ulcer or pre-ulcerative changes or infection. Orthopedic Exam: Muscle tone and strength are WNL. No limitations in general ROM. No crepitus or effusions noted. HAV  B/L.  Hammer toes 2-5  B/L. Skin: No Porokeratosis. No infection or ulcers  Assessment & Plan:  Patient was evaluated and treated and all questions answered.  Onychomycosis with pain  -Nails palliatively debrided as below. -Educated on self-care  Procedure: Nail Debridement Rationale: pain  Type of Debridement: manual, sharp debridement. Instrumentation: Nail nipper,  rotary burr. Number of Nails: 10  Procedures and Treatment: Consent by patient was obtained for treatment procedures. The patient understood the discussion of treatment and procedures well. All questions were answered thoroughly reviewed. Debridement of mycotic and hypertrophic toenails, 1 through 5 bilateral and clearing of subungual debris. No ulceration, no infection noted.  Return Visit-Office Procedure: Patient instructed to return to the office for a follow up visit 3 months for continued evaluation and treatment.  Boneta Lucks, DPM    Return in about 3 months (around 03/06/2020) for diabetic nail trim.

## 2019-12-11 ENCOUNTER — Other Ambulatory Visit: Payer: Self-pay

## 2019-12-12 ENCOUNTER — Encounter: Payer: Self-pay | Admitting: Adult Health

## 2019-12-12 ENCOUNTER — Ambulatory Visit: Payer: Medicare PPO | Admitting: Adult Health

## 2019-12-12 VITALS — BP 160/62 | HR 60 | Temp 97.8°F | Wt 186.2 lb

## 2019-12-12 DIAGNOSIS — E1122 Type 2 diabetes mellitus with diabetic chronic kidney disease: Secondary | ICD-10-CM | POA: Diagnosis not present

## 2019-12-12 DIAGNOSIS — N184 Chronic kidney disease, stage 4 (severe): Secondary | ICD-10-CM | POA: Diagnosis not present

## 2019-12-12 DIAGNOSIS — E0821 Diabetes mellitus due to underlying condition with diabetic nephropathy: Secondary | ICD-10-CM

## 2019-12-12 LAB — POCT GLYCOSYLATED HEMOGLOBIN (HGB A1C): Hemoglobin A1C: 6.2 % — AB (ref 4.0–5.6)

## 2019-12-12 NOTE — Patient Instructions (Addendum)
It was great seeing you today   Your A1c has improved from 7.2 to 6.2 - continue with glipizide 5 mg ER   Please follow up after September 9th for your physical exam

## 2019-12-12 NOTE — Progress Notes (Addendum)
Subjective:    Patient ID: Austin [name], male    DOB: February 14, 1935, 85 y.o.   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  Presents to the office today for  follow-up regarding diabetes mellitus.  His A1c has been very well controlled on glipizide 5 mg extended release.  He does monitor his blood sugars at home and reports readings in the 100-120s consistently.  He denies any episodes of hypoglycemia over the last 6 months.  Lab Results  Component Value Date   HGBA1C 6.2 (A) 12/12/2019    Review of Systems See HPI   Past Medical History:  Diagnosis Date  . 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 CAROTID ARTERY STENOSIS 05/10/2010  . CHF (congestive heart failure) ([place])   . DIABETES MELLITUS, TYPE II 09/19/2007  . DISEASE, CEREBROVASCULAR NEC 03/05/2007  . GERD 03/13/2007  . HYPERLIPIDEMIA 03/05/2007  . HYPERTENSION 03/05/2007  . HYPOKALEMIA 11/09/2009  . KNEE PAIN, RIGHT 11/09/2009  . PROSTATE CANCER, HX OF 03/05/2007  . RENAL DISEASE, CHRONIC 02/03/2009    Social History   Socioeconomic History  . Marital status: Married    Spouse name: Not on file  . Number of children: Not on file  . Years of education: Not on file  . Highest education level: Not on file  Occupational History  . Not on file  Tobacco Use  . Smoking status: Former Smoker    Types: Cigarettes    Quit date: 08/22/1974    Years since quitting: 45.3  . Smokeless tobacco: Never Used  Substance and Sexual Activity  . Alcohol use: No    Alcohol/week: 0.0 standard drinks  . Drug use: No   . Sexual activity: Not on file  Other Topics Concern  . Not on file  Social History Narrative   Retired - Environmental consultant    Married 58 years       He enjoys traveling    Investment banker, operational of Radio broadcast assistant Strain:   . Difficulty of Paying Living Expenses:   Food Insecurity:   . Worried About Charity fundraiser in the Last Year:   . Arboriculturist in the Last Year:   Transportation Needs:   . Film/video editor (Medical):   Marland Kitchen Lack of Transportation (Non-Medical):   Physical Activity:   . Days of Exercise per Week:   . Minutes of Exercise per Session:   Stress:   . Feeling of Stress :   Social Connections:   . Frequency of Communication with Friends and Family:   . Frequency of Social Gatherings with Friends and Family:   . Attends Religious Services:   . Active Member of Clubs or Organizations:   . Attends Archivist Meetings:   Marland Kitchen Marital Status:   Intimate Partner Violence:   . Fear of Current or Ex-Partner:   . Emotionally Abused:   Marland Kitchen Physically Abused:   . Sexually Abused:     Past Surgical History:  Procedure Laterality Date  . CAROTID ARTERY ANGIOPLASTY Right Oct. 10, 2001  . ESOPHAGOGASTRODUODENOSCOPY (EGD) WITH PROPOFOL N/A 11/04/2016   Procedure: ESOPHAGOGASTRODUODENOSCOPY (EGD) WITH PROPOFOL;  Surgeon: Otis Brace, MD;  Location: [facility];  Service: Gastroenterology;  Laterality: N/A;  . LAPAROSCOPIC APPENDECTOMY  02/20/2012   Procedure: APPENDECTOMY LAPAROSCOPIC;  Surgeon: Stark Klein, MD;  Location: [facility];  Service: General;  Laterality: N/A;  . PROSTATE SURGERY     prostatectomy    Family History  Problem Relation Age of Onset  . Hypertension Mother   . Cancer Father        Mesothelioma   . Stomach cancer Brother   . Cancer Brother   . Cancer - Cervical Brother   . Cancer Brother   . Diabetes Brother   . Esophageal cancer Neg Hx   . Colon cancer Neg Hx   . Pancreatic cancer Neg Hx     Allergies   Allergen Reactions  . Aspirin Other (See Comments)    High doses causes stomach ulcer and bleeding    Current Outpatient Medications on File Prior to Visit  Medication Sig Dispense Refill  . acetaminophen (TYLENOL) 325 MG tablet Take 650 mg by mouth every 6 (six) hours as needed for mild pain or moderate pain.    Marland Kitchen amLODipine (NORVASC) 10 MG tablet TAKE 1 TABLET BY MOUTH EVERY DAY (Patient taking differently: Take 10 mg by mouth daily. ) 90 tablet 3  . atorvastatin (LIPITOR) 20 MG tablet TAKE 1 TABLET BY MOUTH EVERY DAY (Patient taking differently: Take 20 mg by mouth daily at 6 PM. ) 90 tablet 3  . Blood Glucose Monitoring Suppl (ACCU-CHEK AVIVA PLUS) w/Device KIT Used to check blood glucose 2 times a day or PRN (Patient taking differently: 1 each by Other route See admin instructions. Used to check blood glucose 2 times a day or PRN) 1 kit 0  . cloNIDine (CATAPRES) 0.2 MG tablet TAKE 1 TABLET (0.2 MG TOTAL) BY MOUTH 3 (THREE) TIMES DAILY. 270 tablet 3  . clopidogrel (PLAVIX) 75 MG tablet TAKE 1 TABLET BY MOUTH EVERY DAY (Patient taking differently: Take 75 mg by mouth daily. ) 90 tablet 3  . [facility] D3 25 MCG (1000 UT) capsule Take 1,000 Units by mouth daily.    Marland Kitchen doxazosin (CARDURA) 2 MG tablet Take 2 mg by mouth at bedtime.   3  . esomeprazole (NEXIUM) 40 MG capsule TAKE 1 CAPSULE BY MOUTH EVERY DAY (Patient taking differently: Take 40 mg by mouth daily. ) 90 capsule 3  . glipiZIDE (GLUCOTROL XL) 5 MG 24 hr tablet TAKE 1 TABLET BY MOUTH EVERY DAY 30 tablet 0  . glucose blood (ACCU-CHEK AVIVA PLUS) test strip Used to check blood glucose BID or PRN (Patient taking differently: 1 each by Other route See admin instructions. Used to check blood glucose BID or PRN) 100 each 12  . hydrocortisone (ANUSOL-HC) 2.5 % rectal cream PLACE 1 APPLICATION RECTALLY 2 TIMES A DAY. (Patient taking differently: Place 1 application rectally daily. ) 30 g 3  . isosorbide-hydrALAZINE (BIDIL) 20-37.5 MG tablet Take 1  tablet by mouth in the morning, at noon, and at bedtime.     Marland Kitchen KLOR-CON M10 10 MEQ tablet Take 10 mEq by mouth daily.    Marland Kitchen lactulose (CHRONULAC) 10 GM/15ML solution TAKE 15 ML BY MOUTH 2 TIMES DAILY AS NEEDED FOR MILD CONSTIPATION. (Patient taking differently: Take 20 g by mouth daily as needed for mild constipation. ) 2838 mL 3  . Lancets (ACCU-CHEK MULTICLIX) lancets Used to check blood glucose BID or PRN (Patient taking differently: 1 each by Other route See admin instructions. Used to check blood  glucose BID or PRN) 100 each 12  . metoprolol succinate (TOPROL-XL) 100 MG 24 hr tablet Take 100 mg by mouth daily.    Marland Kitchen triamcinolone cream (KENALOG) 0.1 % Apply 1 application topically 2 (two) times daily.    . furosemide (LASIX) 40 MG tablet Take 1 tablet (40 mg total) by mouth daily as needed for fluid or edema. 30 tablet 0   No current facility-administered medications on file prior to visit.    BP (!) 160/62 (BP Location: Left Arm, Patient Position: Sitting, Cuff Size: Normal)   Pulse 60   Temp 97.8 F (36.6 C) (Temporal)   Wt 186 lb 3.2 oz (84.5 kg)   SpO2 95%   BMI 30.51 kg/m       Objective:   Physical Exam Vitals and nursing note reviewed.  Constitutional:      Appearance: Normal appearance.  Cardiovascular:     Rate and Rhythm: Normal rate and regular rhythm.     Pulses: Normal pulses.     Heart sounds: Normal heart sounds.  Pulmonary:     Effort: Pulmonary effort is normal.     Breath sounds: Normal breath sounds.  Skin:    General: Skin is warm and dry.  Neurological:     General: No focal deficit present.     Mental Status: He is alert and oriented to person, place, and time.  Psychiatric:        Mood and Affect: Mood normal.        Behavior: Behavior normal.        Thought Content: Thought content normal.        Judgment: Judgment normal.       Assessment & Plan:  1. Type 2 diabetes mellitus with stage 4 chronic kidney disease, without long-term current use  of insulin (HCC) - POCT glycosylated hemoglobin (Hb A1C)  - POCT glycosylated hemoglobin (Hb A1C)- 6.2 - has improved and at goal   - Follow up in 6 months at CPE or sooner if needed  Dorothyann Peng, NP

## 2019-12-20 ENCOUNTER — Encounter: Payer: Self-pay | Admitting: Family Medicine

## 2020-01-01 ENCOUNTER — Other Ambulatory Visit: Payer: Self-pay | Admitting: Adult Health

## 2020-01-01 NOTE — Telephone Encounter (Signed)
Sent to the pharmacy by e-scribe. 

## 2020-01-10 ENCOUNTER — Other Ambulatory Visit: Payer: Self-pay | Admitting: Adult Health

## 2020-01-15 LAB — HM DIABETES EYE EXAM

## 2020-01-21 ENCOUNTER — Encounter: Payer: Self-pay | Admitting: Family Medicine

## 2020-02-23 ENCOUNTER — Other Ambulatory Visit: Payer: Self-pay

## 2020-02-23 ENCOUNTER — Encounter (HOSPITAL_COMMUNITY): Payer: Self-pay | Admitting: Internal Medicine

## 2020-02-23 ENCOUNTER — Inpatient Hospital Stay (HOSPITAL_COMMUNITY)
Admission: EM | Admit: 2020-02-23 | Discharge: 2020-02-26 | DRG: 291 | Disposition: A | Discharge status: Home / Self Care | Payer: Medicare PPO | Attending: Internal Medicine | Admitting: Internal Medicine

## 2020-02-23 ENCOUNTER — Emergency Department (HOSPITAL_COMMUNITY): Payer: Medicare PPO

## 2020-02-23 DIAGNOSIS — Z87891 Personal history of nicotine dependence: Secondary | ICD-10-CM | POA: Diagnosis not present

## 2020-02-23 DIAGNOSIS — Z833 Family history of diabetes mellitus: Secondary | ICD-10-CM | POA: Diagnosis not present

## 2020-02-23 DIAGNOSIS — N179 Acute kidney failure, unspecified: Secondary | ICD-10-CM | POA: Diagnosis present

## 2020-02-23 DIAGNOSIS — I679 Cerebrovascular disease, unspecified: Secondary | ICD-10-CM | POA: Diagnosis present

## 2020-02-23 DIAGNOSIS — Z20822 Contact with and (suspected) exposure to covid-19: Secondary | ICD-10-CM | POA: Diagnosis present

## 2020-02-23 DIAGNOSIS — I5031 Acute diastolic (congestive) heart failure: Secondary | ICD-10-CM | POA: Diagnosis not present

## 2020-02-23 DIAGNOSIS — Z7902 Long term (current) use of antithrombotics/antiplatelets: Secondary | ICD-10-CM

## 2020-02-23 DIAGNOSIS — E876 Hypokalemia: Secondary | ICD-10-CM | POA: Diagnosis present

## 2020-02-23 DIAGNOSIS — Z8546 Personal history of malignant neoplasm of prostate: Secondary | ICD-10-CM | POA: Diagnosis not present

## 2020-02-23 DIAGNOSIS — D631 Anemia in chronic kidney disease: Secondary | ICD-10-CM | POA: Diagnosis present

## 2020-02-23 DIAGNOSIS — E785 Hyperlipidemia, unspecified: Secondary | ICD-10-CM | POA: Diagnosis present

## 2020-02-23 DIAGNOSIS — I130 Hypertensive heart and chronic kidney disease with heart failure and stage 1 through stage 4 chronic kidney disease, or unspecified chronic kidney disease: Secondary | ICD-10-CM | POA: Diagnosis present

## 2020-02-23 DIAGNOSIS — N184 Chronic kidney disease, stage 4 (severe): Secondary | ICD-10-CM | POA: Diagnosis present

## 2020-02-23 DIAGNOSIS — Z888 Allergy status to other drugs, medicaments and biological substances status: Secondary | ICD-10-CM

## 2020-02-23 DIAGNOSIS — K219 Gastro-esophageal reflux disease without esophagitis: Secondary | ICD-10-CM | POA: Diagnosis present

## 2020-02-23 DIAGNOSIS — I160 Hypertensive urgency: Secondary | ICD-10-CM | POA: Diagnosis present

## 2020-02-23 DIAGNOSIS — Z8249 Family history of ischemic heart disease and other diseases of the circulatory system: Secondary | ICD-10-CM | POA: Diagnosis not present

## 2020-02-23 DIAGNOSIS — D509 Iron deficiency anemia, unspecified: Secondary | ICD-10-CM | POA: Diagnosis present

## 2020-02-23 DIAGNOSIS — N185 Chronic kidney disease, stage 5: Secondary | ICD-10-CM | POA: Diagnosis present

## 2020-02-23 DIAGNOSIS — Z9861 Coronary angioplasty status: Secondary | ICD-10-CM | POA: Diagnosis not present

## 2020-02-23 DIAGNOSIS — I5033 Acute on chronic diastolic (congestive) heart failure: Secondary | ICD-10-CM | POA: Diagnosis not present

## 2020-02-23 DIAGNOSIS — N189 Chronic kidney disease, unspecified: Secondary | ICD-10-CM | POA: Diagnosis present

## 2020-02-23 DIAGNOSIS — Z7984 Long term (current) use of oral hypoglycemic drugs: Secondary | ICD-10-CM | POA: Diagnosis not present

## 2020-02-23 DIAGNOSIS — E559 Vitamin D deficiency, unspecified: Secondary | ICD-10-CM | POA: Diagnosis present

## 2020-02-23 DIAGNOSIS — Z9079 Acquired absence of other genital organ(s): Secondary | ICD-10-CM

## 2020-02-23 DIAGNOSIS — R768 Other specified abnormal immunological findings in serum: Secondary | ICD-10-CM | POA: Diagnosis present

## 2020-02-23 DIAGNOSIS — N400 Enlarged prostate without lower urinary tract symptoms: Secondary | ICD-10-CM | POA: Diagnosis present

## 2020-02-23 DIAGNOSIS — E1122 Type 2 diabetes mellitus with diabetic chronic kidney disease: Secondary | ICD-10-CM | POA: Diagnosis present

## 2020-02-23 DIAGNOSIS — I509 Heart failure, unspecified: Secondary | ICD-10-CM

## 2020-02-23 DIAGNOSIS — E1169 Type 2 diabetes mellitus with other specified complication: Secondary | ICD-10-CM | POA: Diagnosis present

## 2020-02-23 DIAGNOSIS — Z79899 Other long term (current) drug therapy: Secondary | ICD-10-CM | POA: Diagnosis not present

## 2020-02-23 LAB — BASIC METABOLIC PANEL
Anion gap: 8 (ref 5–15)
BUN: 28 mg/dL — ABNORMAL HIGH (ref 8–23)
CO2: 23 mmol/L (ref 22–32)
Calcium: 8.8 mg/dL — ABNORMAL LOW (ref 8.9–10.3)
Chloride: 111 mmol/L (ref 98–111)
Creatinine, Ser: 3.09 mg/dL — ABNORMAL HIGH (ref 0.61–1.24)
GFR calc Af Amer: 21 mL/min — ABNORMAL LOW (ref >60)
GFR calc non Af Amer: 18 mL/min — ABNORMAL LOW (ref >60)
Glucose, Bld: 67 mg/dL — ABNORMAL LOW (ref 70–99)
Potassium: 3.3 mmol/L — ABNORMAL LOW (ref 3.5–5.1)
Sodium: 142 mmol/L (ref 135–145)

## 2020-02-23 LAB — CBC
HCT: 32.6 % — ABNORMAL LOW (ref 39.0–52.0)
Hemoglobin: 10.3 g/dL — ABNORMAL LOW (ref 13.0–17.0)
MCH: 26.1 pg (ref 26.0–34.0)
MCHC: 31.6 g/dL (ref 30.0–36.0)
MCV: 82.5 fL (ref 80.0–100.0)
Platelets: 289 10*3/uL (ref 150–400)
RBC: 3.95 MIL/uL — ABNORMAL LOW (ref 4.22–5.81)
RDW: 15.3 % (ref 11.5–15.5)
WBC: 7.3 10*3/uL (ref 4.0–10.5)
nRBC: 0 % (ref 0.0–0.2)

## 2020-02-23 LAB — SARS CORONAVIRUS 2 BY RT PCR (HOSPITAL ORDER, PERFORMED IN ~~LOC~~ HOSPITAL LAB): SARS Coronavirus 2: NEGATIVE

## 2020-02-23 LAB — CBG MONITORING, ED
Glucose-Capillary: 158 mg/dL — ABNORMAL HIGH (ref 70–99)
Glucose-Capillary: 157 mg/dL — ABNORMAL HIGH (ref 70–99)

## 2020-02-23 LAB — BRAIN NATRIURETIC PEPTIDE: B Natriuretic Peptide: 1713.3 pg/mL — ABNORMAL HIGH (ref 0.0–100.0)

## 2020-02-23 MED ORDER — METOPROLOL SUCCINATE ER 100 MG PO TB24
100.0000 mg | ORAL_TABLET | Freq: Every day | ORAL | Status: DC
  Administered 2020-02-24 – 2020-02-26 (×3): 100 mg via ORAL
  Filled 2020-02-23 (×3): qty 1

## 2020-02-23 MED ORDER — ISOSORB DINITRATE-HYDRALAZINE 20-37.5 MG PO TABS
1.0000 | ORAL_TABLET | Freq: Three times a day (TID) | ORAL | Status: DC
  Administered 2020-02-24 – 2020-02-26 (×9): 1 via ORAL
  Filled 2020-02-23 (×9): qty 1

## 2020-02-23 MED ORDER — ONDANSETRON HCL 4 MG/2ML IJ SOLN: 4.0000 mg | Freq: Four times a day (QID) | INTRAMUSCULAR | Status: DC | PRN

## 2020-02-23 MED ORDER — LACTULOSE 10 GM/15ML PO SOLN
20.0000 g | Freq: Every day | ORAL | Status: DC | PRN
  Administered 2020-02-25: 20 g via ORAL
  Filled 2020-02-23: qty 30

## 2020-02-23 MED ORDER — POTASSIUM CHLORIDE CRYS ER 10 MEQ PO TBCR
10.0000 meq | EXTENDED_RELEASE_TABLET | Freq: Every day | ORAL | Status: DC
  Administered 2020-02-24 – 2020-02-26 (×3): 10 meq via ORAL
  Filled 2020-02-23 (×3): qty 1

## 2020-02-23 MED ORDER — POTASSIUM CHLORIDE CRYS ER 20 MEQ PO TBCR
40.0000 meq | EXTENDED_RELEASE_TABLET | Freq: Once | ORAL | Status: AC
  Administered 2020-02-23: 40 meq via ORAL
  Filled 2020-02-23: qty 2

## 2020-02-23 MED ORDER — CLOPIDOGREL BISULFATE 75 MG PO TABS
75.0000 mg | ORAL_TABLET | Freq: Every day | ORAL | Status: DC
  Administered 2020-02-24 – 2020-02-26 (×3): 75 mg via ORAL
  Filled 2020-02-23 (×3): qty 1

## 2020-02-23 MED ORDER — ONDANSETRON HCL 4 MG PO TABS: 4.0000 mg | ORAL_TABLET | Freq: Four times a day (QID) | ORAL | Status: DC | PRN

## 2020-02-23 MED ORDER — DOXAZOSIN MESYLATE 2 MG PO TABS
2.0000 mg | ORAL_TABLET | Freq: Every day | ORAL | Status: DC
  Administered 2020-02-24 – 2020-02-25 (×3): 2 mg via ORAL
  Filled 2020-02-23 (×4): qty 1

## 2020-02-23 MED ORDER — ATORVASTATIN CALCIUM 10 MG PO TABS
20.0000 mg | ORAL_TABLET | Freq: Every day | ORAL | Status: DC
  Administered 2020-02-24 – 2020-02-25 (×2): 20 mg via ORAL
  Filled 2020-02-23 (×2): qty 2

## 2020-02-23 MED ORDER — HEPARIN SODIUM (PORCINE) 5000 UNIT/ML IJ SOLN
5000.0000 [IU] | Freq: Three times a day (TID) | INTRAMUSCULAR | Status: DC
  Administered 2020-02-24 – 2020-02-26 (×6): 5000 [IU] via SUBCUTANEOUS
  Filled 2020-02-23 (×6): qty 1

## 2020-02-23 MED ORDER — PANTOPRAZOLE SODIUM 40 MG PO TBEC
40.0000 mg | DELAYED_RELEASE_TABLET | Freq: Every day | ORAL | Status: DC
  Administered 2020-02-24 – 2020-02-26 (×3): 40 mg via ORAL
  Filled 2020-02-23 (×3): qty 1

## 2020-02-23 MED ORDER — AMLODIPINE BESYLATE 10 MG PO TABS
10.0000 mg | ORAL_TABLET | Freq: Every day | ORAL | Status: DC
  Administered 2020-02-24 – 2020-02-26 (×4): 10 mg via ORAL
  Filled 2020-02-23 (×4): qty 1

## 2020-02-23 MED ORDER — CLONIDINE HCL 0.2 MG PO TABS
0.2000 mg | ORAL_TABLET | Freq: Three times a day (TID) | ORAL | Status: DC
  Administered 2020-02-24 – 2020-02-25 (×5): 0.2 mg via ORAL
  Filled 2020-02-23 (×5): qty 1

## 2020-02-23 MED ORDER — FUROSEMIDE 10 MG/ML IJ SOLN
40.0000 mg | Freq: Two times a day (BID) | INTRAMUSCULAR | Status: DC
  Administered 2020-02-24 – 2020-02-26 (×5): 40 mg via INTRAVENOUS
  Filled 2020-02-23 (×5): qty 4

## 2020-02-23 MED ORDER — FUROSEMIDE 10 MG/ML IJ SOLN
40.0000 mg | Freq: Once | INTRAMUSCULAR | Status: AC
  Administered 2020-02-23: 40 mg via INTRAVENOUS
  Filled 2020-02-23: qty 4

## 2020-02-23 MED ORDER — INSULIN ASPART 100 UNIT/ML ~~LOC~~ SOLN
0.0000 [IU] | Freq: Three times a day (TID) | SUBCUTANEOUS | Status: DC
  Administered 2020-02-24 – 2020-02-25 (×3): 1 [IU] via SUBCUTANEOUS
  Administered 2020-02-25: 2 [IU] via SUBCUTANEOUS
  Administered 2020-02-26: 1 [IU] via SUBCUTANEOUS

## 2020-02-23 MED ORDER — POLYSACCHARIDE IRON COMPLEX 150 MG PO CAPS
150.0000 mg | ORAL_CAPSULE | Freq: Every day | ORAL | Status: DC
  Administered 2020-02-24 – 2020-02-26 (×3): 150 mg via ORAL
  Filled 2020-02-23 (×3): qty 1

## 2020-02-23 NOTE — ED Triage Notes (Signed)
Pt here for eval of 3-4 days of intermittent shortness of breath, worse when lying down. Denies shob on arrival to ED. Increased swelling in lower extremities. Compliant with Torsemide.

## 2020-02-23 NOTE — ED Notes (Signed)
Attempted report to 6E 

## 2020-02-23 NOTE — H&P (Signed)
History and Physical    Austin [name] RPR:[id number] DOB: [birth year] DOA: 02/23/2020  PCP: Dorothyann Peng, NP  Patient coming from: Home.  Chief Complaint: Shortness of breath.  HPI: Austin [name] is a 85 y.o. male with history of chronic diastolic CHF last EF measured in August 2020 was 34 to 60%, hypertension, chronic kidney disease stage IV baseline creatinine around 3 follows with Dr. Royce Macadamia nephrologist and follows with cardiologist Dr. Terrence Dupont presents to the ER because of increasing shortness of breath.  Patient states about 3 days ago patient's Lasix was changed to torsemide.  And patient nephrologist advised to take torsemide twice daily for 3 days and come back to normal dose about 2 weeks ago but despite taking which patient has been getting progressively short of breath increasing peripheral edema.  Last 3 days patient's shortness of breath is worse and unable to lie flat.  Has been some nonproductive cough denies any fever chills.  Has been having some epigastric discomfort with no nausea or vomiting.  ED Course: In the ER patient was getting easily hypoxic on exertion.  Chest x-ray was largely showing chronic changes EKG shows sinus rhythm.  Labs are showing BNP of 1700 creatinine 3 hemoglobin 10.3.  Patient admitted for acute CHF and started on IV Lasix.  Covid test was negative.  Patient blood pressure was more than 446 systolic.  Review of Systems: As per HPI, rest all negative.   Past Medical History:  Diagnosis Date   ANEMIA DUE TO CHRONIC BLOOD LOSS 03/13/2007   CAROTID ARTERY STENOSIS 05/10/2010   CHF (congestive heart failure) ([place])    DIABETES MELLITUS, TYPE II 09/19/2007   DISEASE, CEREBROVASCULAR NEC 03/05/2007   GERD 03/13/2007   HYPERLIPIDEMIA 03/05/2007   HYPERTENSION 03/05/2007   HYPOKALEMIA 11/09/2009   KNEE PAIN, RIGHT 11/09/2009   PROSTATE CANCER, HX OF 03/05/2007   RENAL DISEASE, CHRONIC 02/03/2009    Past Surgical History:  Procedure Laterality Date    CAROTID ARTERY ANGIOPLASTY Right Oct. 10, 2001   ESOPHAGOGASTRODUODENOSCOPY (EGD) WITH PROPOFOL N/A 11/04/2016   Procedure: ESOPHAGOGASTRODUODENOSCOPY (EGD) WITH PROPOFOL;  Surgeon: Otis Brace, MD;  Location: [facility];  Service: Gastroenterology;  Laterality: N/A;   LAPAROSCOPIC APPENDECTOMY  02/20/2012   Procedure: APPENDECTOMY LAPAROSCOPIC;  Surgeon: Stark Klein, MD;  Location: [facility];  Service: General;  Laterality: N/A;   PROSTATE SURGERY     prostatectomy     reports that he quit smoking about 45 years ago. His smoking use included cigarettes. He has never used smokeless tobacco. He reports that he does not drink alcohol and does not use drugs.  Allergies  Allergen Reactions   Aspirin Other (See Comments)    High doses causes stomach ulcer and bleeding    Family History  Problem Relation Age of Onset   Hypertension Mother    Cancer Father        Mesothelioma    Stomach cancer Brother    Cancer Brother    Cancer - Cervical Brother    Cancer Brother    Diabetes Brother    Esophageal cancer Neg Hx    Colon cancer Neg Hx    Pancreatic cancer Neg Hx     Prior to Admission medications   Medication Sig Start Date End Date Taking? Authorizing Provider  acetaminophen (TYLENOL) 325 MG tablet Take 650 mg by mouth every 6 (six) hours as needed for mild pain or moderate pain.   Yes [provider]  amLODipine (NORVASC) 10 MG tablet TAKE 1  TABLET BY MOUTH EVERY DAY Patient taking differently: Take 10 mg by mouth daily.  07/10/19  Yes Nafziger, Tommi Rumps, NP  atorvastatin (LIPITOR) 20 MG tablet TAKE 1 TABLET BY MOUTH EVERY DAY Patient taking differently: Take 20 mg by mouth daily at 6 PM.  07/10/19  Yes Nafziger, Tommi Rumps, NP  Blood Glucose Monitoring Suppl (ACCU-CHEK AVIVA PLUS) w/Device KIT Used to check blood glucose 2 times a day or PRN Patient taking differently: 1 each by Other route See admin instructions. Used to check blood glucose 2 times a day or PRN  09/04/19  Yes Nafziger, Tommi Rumps, NP  cloNIDine (CATAPRES) 0.2 MG tablet TAKE 1 TABLET (0.2 MG TOTAL) BY MOUTH 3 (THREE) TIMES DAILY. 05/01/19  Yes Nafziger, Tommi Rumps, NP  clopidogrel (PLAVIX) 75 MG tablet TAKE 1 TABLET BY MOUTH EVERY DAY Patient taking differently: Take 75 mg by mouth daily.  05/31/19  Yes Nafziger, Tommi Rumps, NP  [employer] D3 25 MCG (1000 UT) capsule Take 1,000 Units by mouth daily. 05/07/19  Yes [provider]  doxazosin (CARDURA) 2 MG tablet Take 2 mg by mouth at bedtime.  12/28/17  Yes [provider]  esomeprazole (NEXIUM) 40 MG capsule TAKE 1 CAPSULE BY MOUTH EVERY DAY Patient taking differently: Take 40 mg by mouth daily.  06/04/19  Yes Nafziger, Tommi Rumps, NP  glipiZIDE (GLUCOTROL XL) 5 MG 24 hr tablet TAKE 1 TABLET BY MOUTH EVERY DAY Patient taking differently: Take 5 mg by mouth daily.  01/01/20  Yes Nafziger, Tommi Rumps, NP  glucose blood (ACCU-CHEK AVIVA PLUS) test strip Used to check blood glucose BID or PRN Patient taking differently: 1 each by Other route See admin instructions. Used to check blood glucose BID or PRN 09/04/19  Yes Nafziger, Tommi Rumps, NP  hydrocortisone (ANUSOL-HC) 2.5 % rectal cream PLACE 1 APPLICATION RECTALLY 2 TIMES A DAY. Patient taking differently: Apply 1 application topically 2 (two) times daily.  01/14/20  Yes Nafziger, Tommi Rumps, NP  isosorbide-hydrALAZINE (BIDIL) 20-37.5 MG tablet Take 1 tablet by mouth in the morning, at noon, and at bedtime.    Yes [provider]  KLOR-CON M10 10 MEQ tablet Take 10 mEq by mouth daily. 07/25/19  Yes [provider]  lactulose (CHRONULAC) 10 GM/15ML solution TAKE 15 ML BY MOUTH 2 TIMES DAILY AS NEEDED FOR MILD CONSTIPATION. Patient taking differently: Take 20 g by mouth daily as needed for mild constipation.  05/28/19  Yes Nafziger, Tommi Rumps, NP  Lancets (ACCU-CHEK MULTICLIX) lancets Used to check blood glucose BID or PRN Patient taking differently: 1 each by Other route See admin instructions. Used to check blood glucose  BID or PRN 09/04/19  Yes Nafziger, Tommi Rumps, NP  losartan (COZAAR) 25 MG tablet Take 12.5 mg by mouth daily. 02/19/20  Yes [provider]  metoprolol succinate (TOPROL-XL) 100 MG 24 hr tablet Take 100 mg by mouth daily. 11/06/19  Yes [provider]  POLY-IRON 150 150 MG capsule Take 150 mg by mouth daily. 01/27/20  Yes [provider]  Propylene Glycol (SYSTANE BALANCE OP) Place 1 drop into both eyes daily as needed.   Yes [provider]  torsemide (DEMADEX) 20 MG tablet Take 20 mg by mouth daily.   Yes [provider]  triamcinolone cream (KENALOG) 0.1 % Apply 1 application topically 2 (two) times daily. 07/05/19  Yes [provider]  furosemide (LASIX) 40 MG tablet Take 1 tablet (40 mg total) by mouth daily as needed for fluid or edema. Patient not taking: Reported on 02/23/2020 08/06/19 11/28/19  Damita Lack, MD    Physical Exam: Constitutional: Moderately built and nourished. Vitals:   02/23/20 1620 02/23/20 1630 02/23/20 1730 02/23/20 2130  BP: (!) 178/64 (!) 177/62 (!) 171/124 (!) 182/68  Pulse: 68 68 82 76  Resp: _0 Temp:      TempSrc:      SpO2: 98% 97% 94% 92%  Weight:      Height:       Eyes: Anicteric no pallor. ENMT: No discharge from the ears eyes nose or mouth. Neck: No mass felt.  JVD elevated. Respiratory: No rhonchi or crepitations. Cardiovascular: S1-S2 heard. Abdomen: Soft nontender bowel sounds present. Musculoskeletal: Mild edema of the both lower extremities. Skin: No rash. Neurologic: Alert awake oriented time place and person.  Moves all extremities. Psychiatric: Appears normal per normal affect.   Labs on Admission: I have personally reviewed following labs and imaging studies  CBC: Recent Labs  Lab 02/23/20 1559  WBC 7.3  HGB 10.3*  HCT 32.6*  MCV 82.5  PLT 643   Basic Metabolic Panel: Recent Labs  Lab 02/23/20 1559  NA 142  K 3.3*  CL 111  CO2 23  GLUCOSE 67*  BUN 28*   CREATININE 3.09*  CALCIUM 8.8*   GFR: Estimated Creatinine Clearance: 18.5 mL/min (A) (by C-G formula based on SCr of 3.09 mg/dL (H)). Liver Function Tests: No results for input(s): AST, ALT, ALKPHOS, BILITOT, PROT, ALBUMIN in the last 168 hours. No results for input(s): LIPASE, AMYLASE in the last 168 hours. No results for input(s): AMMONIA in the last 168 hours. Coagulation Profile: No results for input(s): INR, PROTIME in the last 168 hours. Cardiac Enzymes: No results for input(s): CKTOTAL, CKMB, CKMBINDEX, TROPONINI in the last 168 hours. BNP (last 3 results) No results for input(s): PROBNP in the last 8760 hours. HbA1C: No results for input(s): HGBA1C in the last 72 hours. CBG: Recent Labs  Lab 02/23/20 1832 02/23/20 2140  GLUCAP 158* 157*   Lipid Profile: No results for input(s): CHOL, HDL, LDLCALC, TRIG, CHOLHDL, LDLDIRECT in the last 72 hours. Thyroid Function Tests: No results for input(s): TSH, T4TOTAL, FREET4, T3FREE, THYROIDAB in the last 72 hours. Anemia Panel: No results for input(s): VITAMINB12, FOLATE, FERRITIN, TIBC, IRON, RETICCTPCT in the last 72 hours. Urine analysis:    Component Value Date/Time   COLORURINE YELLOW 04/03/2019 1640   APPEARANCEUR CLEAR 04/03/2019 1640   LABSPEC 1.017 04/03/2019 1640   PHURINE 5.0 04/03/2019 1640   GLUCOSEU 50 (A) 04/03/2019 1640   HGBUR NEGATIVE 04/03/2019 1640   HGBUR negative 09/19/2007 0000   BILIRUBINUR NEGATIVE 04/03/2019 1640   BILIRUBINUR n 11/18/2014 1003   KETONESUR NEGATIVE 04/03/2019 1640   PROTEINUR >=300 (A) 04/03/2019 1640   UROBILINOGEN 0.2 11/18/2014 1003   UROBILINOGEN 1.0 01/26/2013 2059   NITRITE NEGATIVE 04/03/2019 1640   LEUKOCYTESUR NEGATIVE 04/03/2019 1640   Sepsis Labs: _1 (procalcitonin:4,lacticidven:4) ) Recent Results (from the past 240 hour(s))  SARS Coronavirus 2 by RT PCR (hospital order, performed in [facility] hospital lab) Nasopharyngeal Nasopharyngeal Swab      Status: None   Collection Time: 02/23/20  8:22 PM   Specimen: Nasopharyngeal Swab  Result Value Ref Range Status   SARS Coronavirus 2 NEGATIVE NEGATIVE Final    Comment: (NOTE) SARS-CoV-2 target nucleic acids are NOT DETECTED.  The SARS-CoV-2 RNA is generally detectable in upper and lower respiratory specimens during the acute phase of infection. The lowest concentration of SARS-CoV-2 viral copies this assay can detect  is 250 copies / mL. A negative result does not preclude SARS-CoV-2 infection and should not be used as the sole basis for treatment or other patient management decisions.  A negative result may occur with improper specimen collection / handling, submission of specimen other than nasopharyngeal swab, presence of viral mutation(s) within the areas targeted by this assay, and inadequate number of viral copies (<250 copies / mL). A negative result must be combined with clinical observations, patient history, and epidemiological information.  Fact Sheet for Patients:   StrictlyIdeas.no  Fact Sheet for Healthcare Providers: BankingDealers.co.za  This test is not yet approved or  cleared by the [place] FDA and has been authorized for detection and/or diagnosis of SARS-CoV-2 by FDA under an Emergency Use Authorization (EUA).  This EUA will remain in effect (meaning this test can be used) for the duration of the COVID-19 declaration under Section 564(b)(1) of the Act, 21 U.S.C. section 360bbb-3(b)(1), unless the authorization is terminated or revoked sooner.  Performed at [facility], [place] [address], [place], [place] [address]      Radiological Exams on Admission: DG Chest 2 View  Result Date: 02/23/2020 CLINICAL DATA:  Chest pain 3-4 days with shortness of breath. EXAM: CHEST - 2 VIEW COMPARISON:  11/28/2019, 08/04/2019 and 03/27/2019 FINDINGS: Lungs are adequately inflated without acute airspace opacification  or effusion. Stable right perihilar density. Mild stable cardiomegaly. Remainder of the exam is unchanged. IMPRESSION: 1.  No acute cardiopulmonary disease. 2. Chronic stable faint focal right perihilar density. Stable cardiomegaly. Electronically Signed   By: Marin Olp M.D.   On: 02/23/2020 16:07    EKG: Independently reviewed.  Normal sinus rhythm.  Assessment/Plan Active Problems:   Diabetes mellitus with renal complications (HCC)   PROSTATE CANCER, HX OF   Elevated serum immunoglobulin free light chains   Hypertensive urgency   CKD (chronic kidney disease), stage IV (HCC)   Acute CHF (congestive heart failure) ([place])    1. Acute on chronic diastolic CHF last EF measured in August 2020 was 55 to 37% with diastolic dysfunction for which patient has been started on IV Lasix 40 mg IV every 12.  Closely monitor intake output and metabolic panel daily weights. 2. Uncontrolled hypertension likely contributing to patient's symptoms for which patient has been placed on as needed IV hydralazine continue BiDil, amlodipine and clonidine and Cardura.  Holding of Cozaar due to chronic renal disease. 3. Chronic kidney disease stage IV creatinine appears to be at baseline.  Holding of Cozaar for now until we make sure there is no further worsening of renal function given IV Lasix.  Being followed by Dr. Royce Macadamia. 4. Diabetes mellitus type 2 we will keep patient on sliding scale coverage. 5. Chronic anemia likely from renal disease follow CBC. 6. Previous history of prostate cancer.  Since patient has acute CHF with chronic kidney disease and multiple other comorbidities will need close monitoring for any further worsening in inpatient status.   DVT prophylaxis: Heparin. Code Status: Full code. Family Communication: Patient's wife at the bedside. Disposition Plan: Home. Consults called: None. Admission status: Inpatient.   Rise Patience MD [facility] Pager [phone].  If  7PM-7AM, please contact night-coverage [email or web address] Password [facility]  02/23/2020, 10:57 PM

## 2020-02-23 NOTE — ED Provider Notes (Signed)
[facility] EMERGENCY DEPARTMENT Provider Note   CSN: [id number] Arrival date & time: 02/23/20  1526     History Chief Complaint  Patient presents with   Shortness of Breath    Austin [name] is a 85 y.o. male with past medical history significant for anemia, CHF, type 2 diabetes, hyperlipidemia, hypertension, prostate cancer, chronic renal disease.  HPI Patient presents to emergency department today with chief complaint of progressively worsening shortness of breath x3 days.  He states it has been intermittent.  He admits to shortness of breath at rest and with exertion, he does not say it is specifically worsened with exertion.  He also notes that he has had increasing swelling in bilateral lower extremities.  Swelling sounds up to his knees.  Patient tells me he was recently switched from Lasix to torsemide 20 mg daily.  He reports compliance with this and has good urinary output.  He also endorses a nonproductive cough.  He denies any fever, chills, chest pain, syncope, palpitations, abdominal pain, nausea, vomiting, gross hematuria, diarrhea. Denies weight changes.     Past Medical History:  Diagnosis Date   ANEMIA DUE TO CHRONIC BLOOD LOSS 03/13/2007   CAROTID ARTERY STENOSIS 05/10/2010   CHF (congestive heart failure) ([place])    DIABETES MELLITUS, TYPE II 09/19/2007   DISEASE, CEREBROVASCULAR NEC 03/05/2007   GERD 03/13/2007   HYPERLIPIDEMIA 03/05/2007   HYPERTENSION 03/05/2007   HYPOKALEMIA 11/09/2009   KNEE PAIN, RIGHT 11/09/2009   PROSTATE CANCER, HX OF 03/05/2007   RENAL DISEASE, CHRONIC 02/03/2009    Patient Active Problem List   Diagnosis Date Noted   Acute exacerbation of CHF (congestive heart failure) ([place]) 08/04/2019   CKD (chronic kidney disease), stage IV ([place]) 08/04/2019   Elevated troponin 08/04/2019   Hypokalemia 08/04/2019   Hypertensive urgency 03/28/2019   CKD (chronic kidney disease), stage III 03/28/2019   Elevated serum  immunoglobulin free light chains 02/20/2019   Gastrointestinal hemorrhage with melena    Melena 11/03/2016   Carotid artery stenosis 05/10/2010   Diabetes mellitus with renal complications ([place]) 77/41/2878   Iron deficiency anemia due to chronic blood loss 03/13/2007   GERD 03/13/2007   Dyslipidemia 03/05/2007   Essential hypertension 03/05/2007   DISEASE, CEREBROVASCULAR NEC 03/05/2007   PROSTATE CANCER, HX OF 03/05/2007    Past Surgical History:  Procedure Laterality Date   CAROTID ARTERY ANGIOPLASTY Right Oct. 10, 2001   ESOPHAGOGASTRODUODENOSCOPY (EGD) WITH PROPOFOL N/A 11/04/2016   Procedure: ESOPHAGOGASTRODUODENOSCOPY (EGD) WITH PROPOFOL;  Surgeon: Otis Brace, MD;  Location: [facility] END[facility]COPY;  Service: Gastroenterology;  Laterality: N/A;   LAPAROSCOPIC APPENDECTOMY  02/20/2012   Procedure: APPENDECTOMY LAPAROSCOPIC;  Surgeon: Stark Klein, MD;  Location: [facility] OR;  Service: General;  Laterality: N/A;   PROSTATE SURGERY     prostatectomy       Family History  Problem Relation Age of Onset   Hypertension Mother    Cancer Father        Mesothelioma    Stomach cancer Brother    Cancer Brother    Cancer - Cervical Brother    Cancer Brother    Diabetes Brother    Esophageal cancer Neg Hx    Colon cancer Neg Hx    Pancreatic cancer Neg Hx     Social History   Tobacco Use   Smoking status: Former Smoker    Types: Cigarettes    Quit date: 08/22/1974    Years since quitting: 45.5   Smokeless tobacco: Never Used  Vaping Use   Vaping Use: Never used  Substance Use Topics   Alcohol use: No    Alcohol/week: 0.0 standard drinks   Drug use: No    Home Medications Prior to Admission medications   Medication Sig Start Date End Date Taking? Authorizing Provider  acetaminophen (TYLENOL) 325 MG tablet Take 650 mg by mouth every 6 (six) hours as needed for mild pain or moderate pain.    [provider]  amLODipine (NORVASC) 10 MG tablet  TAKE 1 TABLET BY MOUTH EVERY DAY Patient taking differently: Take 10 mg by mouth daily.  07/10/19   Nafziger, Tommi Rumps, NP  atorvastatin (LIPITOR) 20 MG tablet TAKE 1 TABLET BY MOUTH EVERY DAY Patient taking differently: Take 20 mg by mouth daily at 6 PM.  07/10/19   Nafziger, Tommi Rumps, NP  Blood Glucose Monitoring Suppl (ACCU-CHEK AVIVA PLUS) w/Device KIT Used to check blood glucose 2 times a day or PRN Patient taking differently: 1 each by Other route See admin instructions. Used to check blood glucose 2 times a day or PRN 09/04/19   Nafziger, Tommi Rumps, NP  cloNIDine (CATAPRES) 0.2 MG tablet TAKE 1 TABLET (0.2 MG TOTAL) BY MOUTH 3 (THREE) TIMES DAILY. 05/01/19   Nafziger, Tommi Rumps, NP  clopidogrel (PLAVIX) 75 MG tablet TAKE 1 TABLET BY MOUTH EVERY DAY Patient taking differently: Take 75 mg by mouth daily.  05/31/19   Nafziger, Tommi Rumps, NP  CVS D3 25 MCG (1000 UT) capsule Take 1,000 Units by mouth daily. 05/07/19   [provider]  doxazosin (CARDURA) 2 MG tablet Take 2 mg by mouth at bedtime.  12/28/17   [provider]  esomeprazole (NEXIUM) 40 MG capsule TAKE 1 CAPSULE BY MOUTH EVERY DAY Patient taking differently: Take 40 mg by mouth daily.  06/04/19   Nafziger, Tommi Rumps, NP  furosemide (LASIX) 40 MG tablet Take 1 tablet (40 mg total) by mouth daily as needed for fluid or edema. 08/06/19 11/28/19  Amin, Ankit Chirag, MD  glipiZIDE (GLUCOTROL XL) 5 MG 24 hr tablet TAKE 1 TABLET BY MOUTH EVERY DAY 01/01/20   Nafziger, Tommi Rumps, NP  glucose blood (ACCU-CHEK AVIVA PLUS) test strip Used to check blood glucose BID or PRN Patient taking differently: 1 each by Other route See admin instructions. Used to check blood glucose BID or PRN 09/04/19   Nafziger, Tommi Rumps, NP  hydrocortisone (ANUSOL-HC) 2.5 % rectal cream PLACE 1 APPLICATION RECTALLY 2 TIMES A DAY. 01/14/20   Nafziger, Tommi Rumps, NP  isosorbide-hydrALAZINE (BIDIL) 20-37.5 MG tablet Take 1 tablet by mouth in the morning, at noon, and at bedtime.     [provider]   KLOR-CON M10 10 MEQ tablet Take 10 mEq by mouth daily. 07/25/19   [provider]  lactulose (CHRONULAC) 10 GM/15ML solution TAKE 15 ML BY MOUTH 2 TIMES DAILY AS NEEDED FOR MILD CONSTIPATION. Patient taking differently: Take 20 g by mouth daily as needed for mild constipation.  05/28/19   Nafziger, Tommi Rumps, NP  Lancets (ACCU-CHEK MULTICLIX) lancets Used to check blood glucose BID or PRN Patient taking differently: 1 each by Other route See admin instructions. Used to check blood glucose BID or PRN 09/04/19   Dorothyann Peng, NP  metoprolol succinate (TOPROL-XL) 100 MG 24 hr tablet Take 100 mg by mouth daily. 11/06/19   [provider]  triamcinolone cream (KENALOG) 0.1 % Apply 1 application topically 2 (two) times daily. 07/05/19   [provider]    Allergies    Aspirin  Review of Systems  Review of Systems All other systems are reviewed and are negative for acute change except as noted in the HPI.  Physical Exam Updated Vital Signs BP (!) 171/56 (BP Location: Right Arm)    Pulse 62    Temp 97.9 F (36.6 C) (Oral)    Resp 18    Ht '5\' 7"'  (1.702 m)    Wt 81.6 kg    SpO2 98%    BMI 28.19 kg/m   Physical Exam Vitals and nursing note reviewed.  Constitutional:      General: He is not in acute distress.    Appearance: He is not ill-appearing.  HENT:     Head: Normocephalic and atraumatic.     Right Ear: Tympanic membrane and external ear normal.     Left Ear: Tympanic membrane and external ear normal.     Nose: Nose normal.     Mouth/Throat:     Mouth: Mucous membranes are moist.     Pharynx: Oropharynx is clear.  Eyes:     General: No scleral icterus.       Right eye: No discharge.        Left eye: No discharge.     Extraocular Movements: Extraocular movements intact.     Conjunctiva/sclera: Conjunctivae normal.     Pupils: Pupils are equal, round, and reactive to light.  Neck:     Vascular: No JVD.  Cardiovascular:     Rate and Rhythm: Normal rate and  regular rhythm.     Pulses: Normal pulses.          Radial pulses are 2+ on the right side and 2+ on the left side.     Heart sounds: Normal heart sounds.  Pulmonary:     Comments: Lungs clear to auscultation in all fields. Symmetric chest rise. No wheezing, rales, or rhonchi.  Patient is tachypneic, he is speaking in short sentences.  Oxygen saturation is 93% on room air. Abdominal:     Comments: Abdomen is soft, non-distended, and non-tender in all quadrants. No rigidity, no guarding. No peritoneal signs.  Musculoskeletal:        General: Normal range of motion.     Cervical back: Normal range of motion.     Right lower leg: 1+ Pitting Edema present.     Left lower leg: 1+ Pitting Edema present.  Skin:    General: Skin is warm and dry.     Capillary Refill: Capillary refill takes less than 2 seconds.  Neurological:     Mental Status: He is oriented to person, place, and time.     GCS: GCS eye subscore is 4. GCS verbal subscore is 5. GCS motor subscore is 6.     Comments: Fluent speech, no facial droop.  Psychiatric:        Behavior: Behavior normal.     ED Results / Procedures / Treatments   Labs (all labs ordered are listed, but only abnormal results are displayed) Labs Reviewed  BASIC METABOLIC PANEL - Abnormal; Notable for the following components:      Result Value   Potassium 3.3 (*)    Glucose, Bld 67 (*)    BUN 28 (*)    Creatinine, Ser 3.09 (*)    Calcium 8.8 (*)    GFR calc non Af Amer 18 (*)    GFR calc Af Amer 21 (*)    All other components within normal limits  CBC - Abnormal; Notable for the following components:   RBC 3.95 (*)  Hemoglobin 10.3 (*)    HCT 32.6 (*)    All other components within normal limits  BRAIN NATRIURETIC PEPTIDE - Abnormal; Notable for the following components:   B Natriuretic Peptide 1,713.3 (*)    All other components within normal limits  CBG MONITORING, ED - Abnormal; Notable for the following components:   Glucose-Capillary  158 (*)    All other components within normal limits  SARS CORONAVIRUS 2 BY RT PCR [facility] ORDER, [facility])    EKG EKG Interpretation  Date/Time:  Sunday February 23 2020 15:31:49 EDT Ventricular Rate:  62 PR Interval:  222 QRS Duration: 88 QT Interval:  424 QTC Calculation: 430 R Axis:   -16 Text Interpretation: Sinus rhythm with 1st degree A-V block Left ventricular hypertrophy with repolarization abnormality ( R in aVL ) Abnormal ECG similar to April 2021 Confirmed by Sherwood Gambler [phone]) on 02/23/2020 3:54:35 PM   Radiology DG Chest 2 View  Result Date: 02/23/2020 CLINICAL DATA:  Chest pain 3-4 days with shortness of breath. EXAM: CHEST - 2 VIEW COMPARISON:  11/28/2019, 08/04/2019 and 03/27/2019 FINDINGS: Lungs are adequately inflated without acute airspace opacification or effusion. Stable right perihilar density. Mild stable cardiomegaly. Remainder of the exam is unchanged. IMPRESSION: 1.  No acute cardiopulmonary disease. 2. Chronic stable faint focal right perihilar density. Stable cardiomegaly. Electronically Signed   By: Marin Olp M.D.   On: 02/23/2020 16:07    Procedures Procedures (including critical care time)  Medications Ordered in ED Medications  furosemide (LASIX) injection 40 mg (40 mg Intravenous Given 02/23/20 1754)  potassium chloride SA (KLOR-CON) CR tablet 40 mEq (40 mEq Oral Given 02/23/20 1754)    ED Course  I have reviewed the triage vital signs and the nursing notes.  Pertinent labs & imaging results that were available during my care of the patient were reviewed by me and considered in my medical decision making (see chart for details).  Clinical Course as of Feb 22 2001  Nancy Fetter Feb 23, 2020  1718 Patient given food.  Will recheck  Glucose(!): 67 [KA]  1718 Will give Lasix as well as potassium with plan for road test  B Natriuretic Peptide(!): 1,713.3 [KA]  1857 Rechecked with improvement  Glucose-Capillary(!): 158 [KA]     Clinical Course User Index [KA] Cassius Cullinane, Harley Hallmark, PA-C   MDM Rules/Calculators/A&P                          History provided by patient with additional history obtained from chart review.    Patient seen and examined. Patient presents awake, alert, hemodynamically stable, afebrile, non toxic.  On exam patient's lungs are clear to auscultation in all fields.  He is slightly tachypneic.  He has pitting bilateral lower extremity edema.  Chest x-ray shows no acute infectious processes.  Labs show no leukocytosis, hemoglobin consistent with baseline, BUN/creatinine consistent with baseline as well.  Glucose was 67, patient given a snack and when rechecked it had improved 1 to date.  BNP elevated at 1713.3. patient given to milligrams IV Lasix and your potassium.  EKG without ischemic changes. Patient ambulated with desaturation to 87% significant difficulty breathing and shortness of breath.  Once back in bed oxygen improved to 93%, he is not currently on supplemental oxygen.  However given his extreme difficulty with ambulation feel that he would benefit from admission for further diuresis. This case was discussed with Dr. Regenia Skeeter who has seen  the patient and agrees with plan to admit. Covid swab pending. Spoke with Dr. Josephine Cables with hospitalist service who agrees to assume care of patient and bring into the hospital for further evaluation and management.     Portions of this note were generated with Lobby[employer]. Dictation errors may occur despite best attempts at proofreading.   Final Clinical Impression(s) / ED Diagnoses Final diagnoses:  Acute on chronic congestive heart failure, unspecified heart failure type [facility])    Rx / DC Orders ED Discharge Orders    None       Flint Melter 02/23/20 Tyrone Sage, MD 02/23/20 2215

## 2020-02-24 DIAGNOSIS — I5031 Acute diastolic (congestive) heart failure: Secondary | ICD-10-CM

## 2020-02-24 LAB — IRON AND TIBC
Iron: 40 ug/dL — ABNORMAL LOW (ref 45–182)
Saturation Ratios: 14 % — ABNORMAL LOW (ref 17.9–39.5)
TIBC: 279 ug/dL (ref 250–450)
UIBC: 239 ug/dL

## 2020-02-24 LAB — HEPATIC FUNCTION PANEL
ALT: 6 U/L (ref 0–44)
AST: 11 U/L — ABNORMAL LOW (ref 15–41)
Albumin: 3.3 g/dL — ABNORMAL LOW (ref 3.5–5.0)
Alkaline Phosphatase: 66 U/L (ref 38–126)
Bilirubin, Direct: <0.1 mg/dL (ref 0.0–0.2)
Total Bilirubin: 0.9 mg/dL (ref 0.3–1.2)
Total Protein: 6.4 g/dL — ABNORMAL LOW (ref 6.5–8.1)

## 2020-02-24 LAB — CBC
HCT: 30.4 % — ABNORMAL LOW (ref 39.0–52.0)
Hemoglobin: 9.6 g/dL — ABNORMAL LOW (ref 13.0–17.0)
MCH: 25.9 pg — ABNORMAL LOW (ref 26.0–34.0)
MCHC: 31.6 g/dL (ref 30.0–36.0)
MCV: 81.9 fL (ref 80.0–100.0)
Platelets: 270 10*3/uL (ref 150–400)
RBC: 3.71 MIL/uL — ABNORMAL LOW (ref 4.22–5.81)
RDW: 15.2 % (ref 11.5–15.5)
WBC: 8.1 10*3/uL (ref 4.0–10.5)
nRBC: 0 % (ref 0.0–0.2)

## 2020-02-24 LAB — TSH: TSH: 0.887 u[IU]/mL (ref 0.350–4.500)

## 2020-02-24 LAB — RETICULOCYTES
Immature Retic Fract: 12 % (ref 2.3–15.9)
RBC.: 3.68 MIL/uL — ABNORMAL LOW (ref 4.22–5.81)
Retic Count, Absolute: 38.3 10*3/uL (ref 19.0–186.0)
Retic Ct Pct: 1 % (ref 0.4–3.1)

## 2020-02-24 LAB — FOLATE: Folate: 15.5 ng/mL (ref >5.9)

## 2020-02-24 LAB — BASIC METABOLIC PANEL
Anion gap: 9 (ref 5–15)
BUN: 26 mg/dL — ABNORMAL HIGH (ref 8–23)
CO2: 24 mmol/L (ref 22–32)
Calcium: 8.7 mg/dL — ABNORMAL LOW (ref 8.9–10.3)
Chloride: 109 mmol/L (ref 98–111)
Creatinine, Ser: 2.92 mg/dL — ABNORMAL HIGH (ref 0.61–1.24)
GFR calc Af Amer: 22 mL/min — ABNORMAL LOW (ref >60)
GFR calc non Af Amer: 19 mL/min — ABNORMAL LOW (ref >60)
Glucose, Bld: 135 mg/dL — ABNORMAL HIGH (ref 70–99)
Potassium: 3.2 mmol/L — ABNORMAL LOW (ref 3.5–5.1)
Sodium: 142 mmol/L (ref 135–145)

## 2020-02-24 LAB — GLUCOSE, CAPILLARY
Glucose-Capillary: 118 mg/dL — ABNORMAL HIGH (ref 70–99)
Glucose-Capillary: 142 mg/dL — ABNORMAL HIGH (ref 70–99)
Glucose-Capillary: 123 mg/dL — ABNORMAL HIGH (ref 70–99)
Glucose-Capillary: 155 mg/dL — ABNORMAL HIGH (ref 70–99)

## 2020-02-24 LAB — LIPASE, BLOOD: Lipase: 26 U/L (ref 11–51)

## 2020-02-24 LAB — TROPONIN I (HIGH SENSITIVITY): Troponin I (High Sensitivity): 46 ng/L — ABNORMAL HIGH (ref <18)

## 2020-02-24 LAB — VITAMIN B12: Vitamin B-12: 180 pg/mL (ref 180–914)

## 2020-02-24 LAB — MAGNESIUM: Magnesium: 2 mg/dL (ref 1.7–2.4)

## 2020-02-24 LAB — FERRITIN: Ferritin: 24 ng/mL (ref 24–336)

## 2020-02-24 MED ORDER — POTASSIUM CHLORIDE CRYS ER 20 MEQ PO TBCR
40.0000 meq | EXTENDED_RELEASE_TABLET | Freq: Every day | ORAL | Status: AC
  Administered 2020-02-25 – 2020-02-26 (×2): 40 meq via ORAL
  Filled 2020-02-24 (×2): qty 2

## 2020-02-24 MED ORDER — DM-GUAIFENESIN ER 30-600 MG PO TB12
1.0000 | ORAL_TABLET | Freq: Two times a day (BID) | ORAL | Status: DC | PRN
  Administered 2020-02-24 – 2020-02-26 (×3): 1 via ORAL
  Filled 2020-02-24 (×3): qty 1

## 2020-02-24 MED ORDER — ACETAMINOPHEN 325 MG PO TABS
650.0000 mg | ORAL_TABLET | ORAL | Status: DC | PRN
  Administered 2020-02-24: 650 mg via ORAL
  Filled 2020-02-24: qty 2

## 2020-02-24 MED ORDER — HYDRALAZINE HCL 20 MG/ML IJ SOLN
10.0000 mg | INTRAMUSCULAR | Status: DC | PRN
  Administered 2020-02-24 (×2): 10 mg via INTRAVENOUS
  Filled 2020-02-24: qty 1

## 2020-02-24 MED ORDER — POTASSIUM CHLORIDE CRYS ER 10 MEQ PO TBCR
10.0000 meq | EXTENDED_RELEASE_TABLET | Freq: Once | ORAL | Status: AC
  Administered 2020-02-24: 10 meq via ORAL
  Filled 2020-02-24: qty 1

## 2020-02-24 MED ORDER — SENNOSIDES-DOCUSATE SODIUM 8.6-50 MG PO TABS: 2.0000 | ORAL_TABLET | Freq: Every evening | ORAL | Status: DC | PRN

## 2020-02-24 NOTE — Progress Notes (Signed)
Patient's BP 170/64, HR 59.  Given hydralazine 10 mg IVP per prn order for SBP greater than 160 mmhg.  Will continue to monitor.

## 2020-02-24 NOTE — Plan of Care (Signed)
  Problem: Education: Goal: Ability to verbalize understanding of medication therapies will improve Outcome: Progressing   

## 2020-02-24 NOTE — Progress Notes (Addendum)
PROGRESS NOTE    Austin [name]  TIR:[id number] DOB: March 30, 1935 DOA: 02/23/2020 PCP: Dorothyann Peng, NP    Chief Complaint  Patient presents with  . Shortness of Breath    Brief Narrative:  85 year old gentleman prior history of chronic diastolic heart failure, hypertension, stage IV CKD follows up with Dr. Royce Macadamia, outpatient, presents to ED for worsening shortness of breath.  Recently patient reports that his diuretics were changed from Lasix to torsemide but patient continues to have progressive shortness of breath with worsening pedal edema and unable to lie flat associated with some dry cough and presented to ED for further evaluation.  Initial chest x-ray on admission shows chronic changes.  EKG shows sinus rhythm.  BNP was elevated at 1700, creatinine around 3.  Patient was admitted for evaluation and management of acute on chronic diastolic heart failure and was started on IV Lasix 40 mg twice daily.  On exam today patient reports his breathing has improved slightly but reports still short of breath on movement and unable to completely lay flat  Assessment & Plan:   Active Problems:   Diabetes mellitus with renal complications ([facility])   PROSTATE CANCER, HX OF   Elevated serum immunoglobulin free light chains   Hypertensive urgency   CKD (chronic kidney disease), stage IV ([facility])   Acute CHF (congestive heart failure) ([facility])  Acute on chronic diastolic heart failure. Admitted to telemetry and started him on IV Lasix 40 mg twice daily for diuresis. Patient has diuresed only about 800 mL in the last 12 hours. Continue with strict intake and output, daily weights and low-sodium diet. Monitor electrolytes and creatinine while on IV Lasix. Creatinine has improved from 3-2.9 this morning .  Echocardiogram ordered for further evaluation. Dr. Terrence Dupont will be consulted in the morning.    Hypertensive urgency Improved blood pressure parameters. Continue with clonidine 0.2 mg 3 times daily,  Norvasc 10 mg daily, BiDil 20 mg/37.51 tab 3 times daily and metoprolol 100 mg daily.   History of BPH Continue with Cardura 2 mg daily at bedtime    Type 2 diabetes mellitus with renal complications. CBG (last 3)  Recent Labs    02/24/20 0801 02/24/20 1206 02/24/20 1629  GLUCAP 118* 142* 123*   Continue with sliding scale insulin.  Last hemoglobin A1c is around 6.2.   Hyperlipidemia Continue with the Lipitor.   GERD Continue with Protonix 40 mg daily.  Hypokalemia Replaced Repeat in the morning.  Anemia of chronic disease Baseline hemoglobin between 9-10. Currently around 9.6. Stool for occult blood ordered Anemia panel will be ordered.   Stage IV CKD Creatinine baseline is around 3.  Currently is at 2.9.  Will notify Dr. Royce Macadamia of patient's admission in the morning.  DVT prophylaxis: Heparin Code Status: (Full coDE Family Communication: Family at bedside Disposition:   Status is: Inpatient  Remains inpatient appropriate because:IV treatments appropriate due to intensity of illness or inability to take PO   Dispo: The patient is from: Home              Anticipated d/c is to: Home              Anticipated d/c date is: 2 days              Patient currently is not medically stable to d/c.       Consultants:   We will request cardiology in the morning  Procedures: Echocardiogram  Antimicrobials: None   Subjective: Breathing has improved but still  shortness of breath on movement Objective: Vitals:   02/24/20 1243 02/24/20 1327 02/24/20 1449 02/24/20 1556  BP: (!) 167/69 (!) 147/61 (!) 151/56 (!) 164/68  Pulse: 72 66 64 62  Resp:   18 18  Temp:   98.5 F (36.9 C) 98.3 F (36.8 C)  TempSrc:   Oral Oral  SpO2: 90% 90% 95% 96%  Weight:      Height:        Intake/Output Summary (Last 24 hours) at 02/24/2020 1626 Last data filed at 02/24/2020 1200 Gross per 24 hour  Intake 840 ml  Output 1150 ml  Net -310 ml   Filed Weights   02/23/20  1533 02/23/20 2240 02/24/20 0501  Weight: 81.6 kg 80.3 kg 79.4 kg    Examination:  General exam: Appears calm and comfortable  Respiratory system: Clear to auscultation. Respiratory effort normal. Cardiovascular system: S1 & S2 heard, RRR. No JVD,  Pedal edema present.  Gastrointestinal system: Abdomen is nondistended, soft and nontender. Normal bowel sounds heard. Central nervous system: Alert and oriented. No focal neurological deficits. Extremities: Symmetric 5 x 5 power. Skin: No rashes, lesions or ulcers Psychiatry:Mood & affect appropriate.     Data Reviewed: I have personally reviewed following labs and imaging studies  CBC: Recent Labs  Lab 02/23/20 1559 02/24/20 0339  WBC 7.3 8.1  HGB 10.3* 9.6*  HCT 32.6* 30.4*  MCV 82.5 81.9  PLT 289 283    Basic Metabolic Panel: Recent Labs  Lab 02/23/20 1559 02/24/20 0339  NA 142 142  K 3.3* 3.2*  CL 111 109  CO2 23 24  GLUCOSE 67* 135*  BUN 28* 26*  CREATININE 3.09* 2.92*  CALCIUM 8.8* 8.7*  MG  --  2.0    GFR: Estimated Creatinine Clearance: 19.4 mL/min (A) (by C-G formula based on SCr of 2.92 mg/dL (H)).  Liver Function Tests: Recent Labs  Lab 02/24/20 0802  AST 11*  ALT 6  ALKPHOS 66  BILITOT 0.9  PROT 6.4*  ALBUMIN 3.3*    CBG: Recent Labs  Lab 02/23/20 1832 02/23/20 2140 02/24/20 0801 02/24/20 1206  GLUCAP 158* 157* 118* 142*     Recent Results (from the past 240 hour(s))  SARS Coronavirus 2 by RT PCR (hospital order, performed in [facility] hospital lab) Nasopharyngeal Nasopharyngeal Swab     Status: None   Collection Time: 02/23/20  8:22 PM   Specimen: Nasopharyngeal Swab  Result Value Ref Range Status   SARS Coronavirus 2 NEGATIVE NEGATIVE Final    Comment: (NOTE) SARS-CoV-2 target nucleic acids are NOT DETECTED.  The SARS-CoV-2 RNA is generally detectable in upper and lower respiratory specimens during the acute phase of infection. The lowest concentration of SARS-CoV-2 viral  copies this assay can detect is 250 copies / mL. A negative result does not preclude SARS-CoV-2 infection and should not be used as the sole basis for treatment or other patient management decisions.  A negative result may occur with improper specimen collection / handling, submission of specimen other than nasopharyngeal swab, presence of viral mutation(s) within the areas targeted by this assay, and inadequate number of viral copies (<250 copies / mL). A negative result must be combined with clinical observations, patient history, and epidemiological information.  Fact Sheet for Patients:   StrictlyIdeas.no  Fact Sheet for Healthcare Providers: BankingDealers.co.za  This test is not yet approved or  cleared by the [place] FDA and has been authorized for detection and/or diagnosis of SARS-CoV-2 by FDA under an  Emergency Use Authorization (EUA).  This EUA will remain in effect (meaning this test can be used) for the duration of the COVID-19 declaration under Section 564(b)(1) of the Act, 21 U.S.C. section 360bbb-3(b)(1), unless the authorization is terminated or revoked sooner.  Performed at [facility], [place] [address] [place], [place] [address]          Radiology Studies: DG Chest 2 View  Result Date: 02/23/2020 CLINICAL DATA:  Chest pain 3-4 days with shortness of breath. EXAM: CHEST - 2 VIEW COMPARISON:  11/28/2019, 08/04/2019 and 03/27/2019 FINDINGS: Lungs are adequately inflated without acute airspace opacification or effusion. Stable right perihilar density. Mild stable cardiomegaly. Remainder of the exam is unchanged. IMPRESSION: 1.  No acute cardiopulmonary disease. 2. Chronic stable faint focal right perihilar density. Stable cardiomegaly. Electronically Signed   By: Marin Olp M.D.   On: 02/23/2020 16:07        Scheduled Meds: . amLODipine  10 mg Oral Daily  . atorvastatin  20 mg Oral q1800  .  cloNIDine  0.2 mg Oral Q8H  . clopidogrel  75 mg Oral Daily  . doxazosin  2 mg Oral QHS  . furosemide  40 mg Intravenous Q12H  . heparin  5,000 Units Subcutaneous Q8H  . insulin aspart  0-9 Units Subcutaneous TID WC  . iron polysaccharides  150 mg Oral Daily  . isosorbide-hydrALAZINE  1 tablet Oral TID  . metoprolol succinate  100 mg Oral Daily  . pantoprazole  40 mg Oral Daily  . potassium chloride  10 mEq Oral Daily  . [START ON 02/25/2020] potassium chloride  40 mEq Oral Daily   Continuous Infusions:   LOS: 1 day        Hosie Poisson, MD [facility]   To contact the attending provider between 7A-7P or the covering provider during after hours 7P-7A, please log into the web site [email or web address] and access using universal [place] password for that web site. If you do not have the password, please call the hospital operator.  02/24/2020, 4:26 PM

## 2020-02-24 NOTE — Progress Notes (Signed)
Hydralazine 10 mg IV given at 1154 hrs for BP 168/65, HR 61.  BP decreased to 147/61 at 1327 hrs.  Will continue to monitor.

## 2020-02-25 ENCOUNTER — Inpatient Hospital Stay (HOSPITAL_COMMUNITY): Payer: Medicare PPO

## 2020-02-25 DIAGNOSIS — I5033 Acute on chronic diastolic (congestive) heart failure: Secondary | ICD-10-CM

## 2020-02-25 LAB — BASIC METABOLIC PANEL
Anion gap: 12 (ref 5–15)
BUN: 29 mg/dL — ABNORMAL HIGH (ref 8–23)
CO2: 24 mmol/L (ref 22–32)
Calcium: 8.6 mg/dL — ABNORMAL LOW (ref 8.9–10.3)
Chloride: 106 mmol/L (ref 98–111)
Creatinine, Ser: 3.12 mg/dL — ABNORMAL HIGH (ref 0.61–1.24)
GFR calc Af Amer: 20 mL/min — ABNORMAL LOW (ref >60)
GFR calc non Af Amer: 18 mL/min — ABNORMAL LOW (ref >60)
Glucose, Bld: 110 mg/dL — ABNORMAL HIGH (ref 70–99)
Potassium: 3.5 mmol/L (ref 3.5–5.1)
Sodium: 142 mmol/L (ref 135–145)

## 2020-02-25 LAB — GLUCOSE, CAPILLARY
Glucose-Capillary: 123 mg/dL — ABNORMAL HIGH (ref 70–99)
Glucose-Capillary: 177 mg/dL — ABNORMAL HIGH (ref 70–99)
Glucose-Capillary: 118 mg/dL — ABNORMAL HIGH (ref 70–99)
Glucose-Capillary: 134 mg/dL — ABNORMAL HIGH (ref 70–99)

## 2020-02-25 LAB — ECHOCARDIOGRAM COMPLETE
Height: 67 in
Weight: 2801.6 oz

## 2020-02-25 LAB — OCCULT BLOOD X 1 CARD TO LAB, STOOL: Fecal Occult Bld: NEGATIVE

## 2020-02-25 MED ORDER — CLONIDINE HCL 0.3 MG PO TABS
0.3000 mg | ORAL_TABLET | Freq: Three times a day (TID) | ORAL | Status: DC
  Administered 2020-02-25 – 2020-02-26 (×4): 0.3 mg via ORAL
  Filled 2020-02-25 (×4): qty 1

## 2020-02-25 NOTE — Progress Notes (Signed)
  Echocardiogram 2D Echocardiogram has been performed.  Austin [name] 02/25/2020, 4:04 PM

## 2020-02-25 NOTE — Progress Notes (Signed)
PROGRESS NOTE    Austin [name]  EHO:[id number] DOB: 02-Aug-1935 DOA: 02/23/2020 PCP: Dorothyann Peng, NP    Chief Complaint  Patient presents with  . Shortness of Breath    Brief Narrative:  85 year old gentleman prior history of chronic diastolic heart failure, hypertension, stage IV CKD follows up with Dr. Royce Macadamia, outpatient, presents to ED for worsening shortness of breath.  Recently patient reports that his diuretics were changed from Lasix to torsemide but patient continues to have progressive shortness of breath with worsening pedal edema and unable to lie flat associated with some dry cough and presented to ED for further evaluation.  Initial chest x-ray on admission shows chronic changes.  EKG shows sinus rhythm.  BNP was elevated at 1700, creatinine around 3.  Patient was admitted for evaluation and management of acute on chronic diastolic heart failure and was started on IV Lasix 40 mg twice daily.  He has diuresed about 2.2 lit in the last 24 hours with slight bump in creatinine.  Pt seen and examined,reports breathing has improved.  Denies any chest pain, denies any nausea vomiting or abdominal pain.   Assessment & Plan:   Active Problems:   Diabetes mellitus with renal complications (HCC)   PROSTATE CANCER, HX OF   Elevated serum immunoglobulin free light chains   Hypertensive urgency   CKD (chronic kidney disease), stage IV (HCC)   Acute CHF (congestive heart failure) (HCC)  Acute on chronic diastolic heart failure. Admitted to telemetry and started him on IV Lasix 40 mg twice daily for diuresis. Patient has diuresed 2.2 L in the last 24 hours. Continue with strict intake and output, daily weights and low-sodium diet. Monitor electrolytes and creatinine while on IV Lasix. Baseline creatinine is around 3 and currently is at 3.2, replete potassium as appropriate Echocardiogram ordered for further evaluation and pending EKG shows nonspecific ST or T wave changes unchanged from  April. Minimally elevated troponin at 46, patient denies any chest pain at this time Dr. Terrence Dupont consulted. Patient currently denies any chest pain reports his breathing has improved.  PT evaluation will be ordered.    Hypertensive urgency Slightly elevated blood pressure parameters earlier this morning and last night Increase clonidine to 0.3 mg 3 times daily, continue with, Norvasc 10 mg daily, BiDil 20 mg/37.51 tab 3 times daily and metoprolol 100 mg daily.   History of BPH Continue with Cardura 2 mg daily at bedtime    Type 2 diabetes mellitus with renal complications. CBG (last 3)  Recent Labs    02/24/20 2117 02/25/20 0802 02/25/20 1230  GLUCAP 155* 123* 177*   Continue with sliding scale insulin.  Last hemoglobin A1c is around 6.2. No changes in medications   Hyperlipidemia Continue with the Lipitor.   GERD Continue with Protonix 40 mg daily.  Hypokalemia Replaced  Anemia of chronic disease Baseline hemoglobin between 9-10. Currently around 9.6. Stool for occult blood ordered Anemia panel reviewed, showed low iron levels iron supplementation will be added.   Stage IV CKD Creatinine baseline is around 3.   Creatinine at 3.12 today Patient follows up with Dr. Royce Macadamia as an outpatient.   DVT prophylaxis: Heparin Code Status: Full code Family Communication none at bedside Disposition:   Status is: Inpatient  Remains inpatient appropriate because:IV treatments appropriate due to intensity of illness or inability to take PO   Dispo: The patient is from: Home              Anticipated d/c is to: Home  Anticipated d/c date is: 2 days              Patient currently is not medically stable to d/c.       Consultants:   We will request cardiology in the morning  Procedures: Echocardiogram  Antimicrobials: None   Subjective: Patient reports his breathing has improved, no chest pain. No nausea vomiting or abdominal  pain. Objective: Vitals:   02/24/20 1818 02/24/20 2035 02/25/20 0537 02/25/20 1146  BP: (!) 166/70 (!) 174/65 (!) 153/65 (!) 176/70  Pulse: 68 73 60 68  Resp:  18 16   Temp:  98.4 F (36.9 C) 98.9 F (37.2 C)   TempSrc:  Oral Oral   SpO2: 97% 96% 96%   Weight:   79.4 kg   Height:        Intake/Output Summary (Last 24 hours) at 02/25/2020 1244 Last data filed at 02/25/2020 0900 Gross per 24 hour  Intake 600 ml  Output 1450 ml  Net -850 ml   Filed Weights   02/23/20 2240 02/24/20 0501 02/25/20 0537  Weight: 80.3 kg 79.4 kg 79.4 kg    Examination:  General exam: Alert, comfortable, not in any kind of distress, room air Respiratory system: Air entry fair bilateral, no wheezing or rhonchi, no tachypnea Cardiovascular system: S1-S2 heard irregular rate rhythm, no JVD, pedal edema improving Gastrointestinal system: Abdomen is soft, nontender, nondistended, bowel sounds normal  Central nervous system: Alert and oriented, grossly nonfocal Extremities: No cyanosis or clubbing Skin: No rashes seen Psychiatry: Mood is appropriate   Data Reviewed: I have personally reviewed following labs and imaging studies  CBC: Recent Labs  Lab 02/23/20 1559 02/24/20 0339  WBC 7.3 8.1  HGB 10.3* 9.6*  HCT 32.6* 30.4*  MCV 82.5 81.9  PLT 289 734    Basic Metabolic Panel: Recent Labs  Lab 02/23/20 1559 02/24/20 0339 02/25/20 0321  NA 142 142 142  K 3.3* 3.2* 3.5  CL 111 109 106  CO2 23 24 24   GLUCOSE 67* 135* 110*  BUN 28* 26* 29*  CREATININE 3.09* 2.92* 3.12*  CALCIUM 8.8* 8.7* 8.6*  MG  --  2.0  --     GFR: Estimated Creatinine Clearance: 18.1 mL/min (A) (by C-G formula based on SCr of 3.12 mg/dL (H)).  Liver Function Tests: Recent Labs  Lab 02/24/20 0802  AST 11*  ALT 6  ALKPHOS 66  BILITOT 0.9  PROT 6.4*  ALBUMIN 3.3*    CBG: Recent Labs  Lab 02/24/20 1206 02/24/20 1629 02/24/20 2117 02/25/20 0802 02/25/20 1230  GLUCAP 142* 123* 155* 123* 177*      Recent Results (from the past 240 hour(s))  SARS Coronavirus 2 by RT PCR (hospital order, performed in [facility] hospital lab) Nasopharyngeal Nasopharyngeal Swab     Status: None   Collection Time: 02/23/20  8:22 PM   Specimen: Nasopharyngeal Swab  Result Value Ref Range Status   SARS Coronavirus 2 NEGATIVE NEGATIVE Final    Comment: (NOTE) SARS-CoV-2 target nucleic acids are NOT DETECTED.  The SARS-CoV-2 RNA is generally detectable in upper and lower respiratory specimens during the acute phase of infection. The lowest concentration of SARS-CoV-2 viral copies this assay can detect is 250 copies / mL. A negative result does not preclude SARS-CoV-2 infection and should not be used as the sole basis for treatment or other patient management decisions.  A negative result may occur with improper specimen collection / handling, submission of specimen other than nasopharyngeal swab, presence  of viral mutation(s) within the areas targeted by this assay, and inadequate number of viral copies (<250 copies / mL). A negative result must be combined with clinical observations, patient history, and epidemiological information.  Fact Sheet for Patients:   StrictlyIdeas.no  Fact Sheet for Healthcare Providers: BankingDealers.co.za  This test is not yet approved or  cleared by the [place] FDA and has been authorized for detection and/or diagnosis of SARS-CoV-2 by FDA under an Emergency Use Authorization (EUA).  This EUA will remain in effect (meaning this test can be used) for the duration of the COVID-19 declaration under Section 564(b)(1) of the Act, 21 U.S.C. section 360bbb-3(b)(1), unless the authorization is terminated or revoked sooner.  Performed at [facility], [place] [address], [place], [place] [address]          Radiology Studies: DG Chest 2 View  Result Date: 02/23/2020 CLINICAL DATA:  Chest pain 3-4 days with  shortness of breath. EXAM: CHEST - 2 VIEW COMPARISON:  11/28/2019, 08/04/2019 and 03/27/2019 FINDINGS: Lungs are adequately inflated without acute airspace opacification or effusion. Stable right perihilar density. Mild stable cardiomegaly. Remainder of the exam is unchanged. IMPRESSION: 1.  No acute cardiopulmonary disease. 2. Chronic stable faint focal right perihilar density. Stable cardiomegaly. Electronically Signed   By: Marin Olp M.D.   On: 02/23/2020 16:07        Scheduled Meds: . amLODipine  10 mg Oral Daily  . atorvastatin  20 mg Oral q1800  . cloNIDine  0.3 mg Oral Q8H  . clopidogrel  75 mg Oral Daily  . doxazosin  2 mg Oral QHS  . furosemide  40 mg Intravenous Q12H  . heparin  5,000 Units Subcutaneous Q8H  . insulin aspart  0-9 Units Subcutaneous TID WC  . iron polysaccharides  150 mg Oral Daily  . isosorbide-hydrALAZINE  1 tablet Oral TID  . metoprolol succinate  100 mg Oral Daily  . pantoprazole  40 mg Oral Daily  . potassium chloride  10 mEq Oral Daily  . potassium chloride  40 mEq Oral Daily   Continuous Infusions:   LOS: 2 days        Hosie Poisson, MD [facility]   To contact the attending provider between 7A-7P or the covering provider during after hours 7P-7A, please log into the web site [email or web address] and access using universal [place] password for that web site. If you do not have the password, please call the hospital operator.  02/25/2020, 12:44 PM

## 2020-02-25 NOTE — Consult Note (Signed)
Reason for Consult:congestive heart failure Referring Physician: [facility] hospitalist  Austin [name] is an 85 y.o. male.  Austin [name] is a 85 year old male with past medical history significant for hypertensive heart disease with diastolic dysfunction, history of congestive heart failure secondary to preserved LV systolic function, diabetes mellitus, hyperlipidemia, cerebral vascular disease, status post PTCA to internal carotid artery in the past, chronic kidney disease stage IV, anemia of chronic disease, history of carcinoma of the prostate, GERD,was admitted because of progressive shortness of breath associated with leg swelling for last few days.  Patient denies any chest pain but does give history of PND, orthopnea and leg swelling.  Recently Lasix was switched to torsemide, without much improvement in his breathing.  Patient denies any excessive salty food intake.  Denies any noncompliance to medications.  Denies cough, fever or chills.  Denies any urinary complaints.  Patient received IV Lasix with good diuresis and improvement in his breathing and leg swelling.  States had good night sleep  last night.  Presently denies any complaints.  Past Medical History:  Diagnosis Date  . 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 CAROTID ARTERY STENOSIS 05/10/2010  . CHF (congestive heart failure) ([place])   . DIABETES MELLITUS, TYPE II 09/19/2007  . DISEASE, CEREBROVASCULAR NEC 03/05/2007  . GERD 03/13/2007  . HYPERLIPIDEMIA 03/05/2007  . HYPERTENSION 03/05/2007  . HYPOKALEMIA 11/09/2009  . KNEE PAIN, RIGHT 11/09/2009  . PROSTATE CANCER, HX OF 03/05/2007  . RENAL DISEASE, CHRONIC 02/03/2009    Past Surgical History:  Procedure Laterality Date  . CAROTID ARTERY ANGIOPLASTY Right Oct. 10, 2001  . ESOPHAGOGASTRODUODENOSCOPY (EGD) WITH PROPOFOL N/A 11/04/2016   Procedure: ESOPHAGOGASTRODUODENOSCOPY (EGD) WITH PROPOFOL;  Surgeon: Otis Brace, MD;  Location: [facility];  Service: Gastroenterology;   Laterality: N/A;  . LAPAROSCOPIC APPENDECTOMY  02/20/2012   Procedure: APPENDECTOMY LAPAROSCOPIC;  Surgeon: Stark Klein, MD;  Location: [facility];  Service: General;  Laterality: N/A;  . PROSTATE SURGERY     prostatectomy    Family History  Problem Relation Age of Onset  . Hypertension Mother   . Cancer Father        Mesothelioma   . Stomach cancer Brother   . Cancer Brother   . Cancer - Cervical Brother   . Cancer Brother   . Diabetes Brother   . Esophageal cancer Neg Hx   . Colon cancer Neg Hx   . Pancreatic cancer Neg Hx     Social History:  reports that he quit smoking about 45 years ago. His smoking use included cigarettes. He has never used smokeless tobacco. He reports that he does not drink alcohol and does not use drugs.  Allergies:  Allergies  Allergen Reactions  . Aspirin Other (See Comments)    High doses causes stomach ulcer and bleeding    Medications: I have reviewed the patient's current medications.  Results for orders placed or performed during the hospital encounter of 02/23/20 (from the past 48 hour(s))  Basic metabolic panel     Status: Abnormal   Collection Time: 02/23/20  3:59 PM  Result Value Ref Range   Sodium 142 135 - 145 mmol/L   Potassium 3.3 (L) 3.5 - 5.1 mmol/L   Chloride 111 98 - 111 mmol/L   CO2 23 22 - 32 mmol/L   Glucose, Bld 67 (L) 70 - 99 mg/dL    Comment: Glucose reference range applies only to samples taken after fasting for at least 8 hours.   BUN 28 (H) 8 -  23 mg/dL   Creatinine, Ser 3.09 (H) 0.61 - 1.24 mg/dL   Calcium 8.8 (L) 8.9 - 10.3 mg/dL   GFR calc non Af Amer 18 (L) >60 mL/min   GFR calc Af Amer 21 (L) >60 mL/min   Anion gap 8 5 - 15    Comment: Performed at [facility] [address], [place], [place] [address]  CBC     Status: Abnormal   Collection Time: 02/23/20  3:59 PM  Result Value Ref Range   WBC 7.3 4.0 - 10.5 K/uL   RBC 3.95 (L) 4.22 - 5.81 MIL/uL   Hemoglobin 10.3 (L) 13.0 - 17.0 g/dL   HCT 32.6 (L)  39 - 52 %   MCV 82.5 80.0 - 100.0 fL   MCH 26.1 26.0 - 34.0 pg   MCHC 31.6 30.0 - 36.0 g/dL   RDW 15.3 11.5 - 15.5 %   Platelets 289 150 - 400 K/uL   nRBC 0.0 0.0 - 0.2 %    Comment: Performed at [facility], [place] [address] [place], [place] [address]  Brain natriuretic peptide     Status: Abnormal   Collection Time: 02/23/20  3:59 PM  Result Value Ref Range   B Natriuretic Peptide 1,713.3 (H) 0.0 - 100.0 pg/mL    Comment: Performed at [facility] [address] [place], [place] [address]  POC CBG, ED     Status: Abnormal   Collection Time: 02/23/20  6:32 PM  Result Value Ref Range   Glucose-Capillary 158 (H) 70 - 99 mg/dL    Comment: Glucose reference range applies only to samples taken after fasting for at least 8 hours.   Comment 1 Notify RN    Comment 2 Document in Chart   SARS Coronavirus 2 by RT PCR (hospital order, performed in [facility] hospital lab) Nasopharyngeal Nasopharyngeal Swab     Status: None   Collection Time: 02/23/20  8:22 PM   Specimen: Nasopharyngeal Swab  Result Value Ref Range   SARS Coronavirus 2 NEGATIVE NEGATIVE    Comment: (NOTE) SARS-CoV-2 target nucleic acids are NOT DETECTED.  The SARS-CoV-2 RNA is generally detectable in upper and lower respiratory specimens during the acute phase of infection. The lowest concentration of SARS-CoV-2 viral copies this assay can detect is 250 copies / mL. A negative result does not preclude SARS-CoV-2 infection and should not be used as the sole basis for treatment or other patient management decisions.  A negative result may occur with improper specimen collection / handling, submission of specimen other than nasopharyngeal swab, presence of viral mutation(s) within the areas targeted by this assay, and inadequate number of viral copies (<250 copies / mL). A negative result must be combined with clinical observations, patient history, and epidemiological information.  Fact Sheet for Patients:    StrictlyIdeas.no  Fact Sheet for Healthcare Providers: BankingDealers.co.za  This test is not yet approved or  cleared by the [place] FDA and has been authorized for detection and/or diagnosis of SARS-CoV-2 by FDA under an Emergency Use Authorization (EUA).  This EUA will remain in effect (meaning this test can be used) for the duration of the COVID-19 declaration under Section 564(b)(1) of the Act, 21 U.S.C. section 360bbb-3(b)(1), unless the authorization is terminated or revoked sooner.  Performed at [facility], [place] [address] [place], [place] [address]   CBG monitoring, ED     Status: Abnormal   Collection Time: 02/23/20  9:40 PM  Result Value Ref Range   Glucose-Capillary 157 (H) 70 - 99 mg/dL    Comment: Glucose reference range applies only to samples taken after fasting for at least 8 hours.  Basic metabolic panel     Status: Abnormal   Collection Time: 02/24/20  3:39 AM  Result Value Ref Range   Sodium 142 135 - 145 mmol/L   Potassium 3.2 (L) 3.5 - 5.1 mmol/L   Chloride 109 98 - 111 mmol/L   CO2 24 22 - 32 mmol/L   Glucose, Bld 135 (H) 70 - 99 mg/dL    Comment: Glucose reference range applies only to samples taken after fasting for at least 8 hours.   BUN 26 (H) 8 - 23 mg/dL   Creatinine, Ser 2.92 (H) 0.61 - 1.24 mg/dL   Calcium 8.7 (L) 8.9 - 10.3 mg/dL   GFR calc non Af Amer 19 (L) >60 mL/min   GFR calc Af Amer 22 (L) >60 mL/min   Anion gap 9 5 - 15    Comment: Performed at [facility] [address] [place], [place] [address]  CBC     Status: Abnormal   Collection Time: 02/24/20  3:39 AM  Result Value Ref Range   WBC 8.1 4.0 - 10.5 K/uL   RBC 3.71 (L) 4.22 - 5.81 MIL/uL   Hemoglobin 9.6 (L) 13.0 - 17.0 g/dL   HCT 30.4 (L) 39 - 52 %   MCV 81.9 80.0 - 100.0 fL   MCH 25.9 (L) 26.0 - 34.0 pg   MCHC 31.6 30.0 - 36.0 g/dL   RDW 15.2 11.5 - 15.5 %   Platelets 270 150 - 400 K/uL   nRBC 0.0 0.0 -  0.2 %    Comment: Performed at [facility], [place] [address] [place], [place] [address]  Magnesium     Status: None   Collection Time: 02/24/20  3:39 AM  Result Value Ref Range   Magnesium 2.0 1.7 - 2.4 mg/dL    Comment: Performed at [facility] [address] [place], [place] [address]  TSH     Status: None   Collection Time: 02/24/20  3:39 AM  Result Value Ref Range   TSH 0.887 0.350 - 4.500 uIU/mL    Comment: Performed by a 3rd Generation assay with a functional sensitivity of <=0.01 uIU/mL. Performed at [facility], [place] [address], [place], [place] [address]   Glucose, capillary     Status: Abnormal   Collection Time: 02/24/20  8:01 AM  Result Value Ref Range   Glucose-Capillary 118 (H) 70 - 99 mg/dL    Comment: Glucose reference range applies only to samples taken after fasting for at least 8 hours.   Comment 1 Notify RN    Comment 2 Document in Chart   Troponin I (High Sensitivity)     Status: Abnormal   Collection Time: 02/24/20  8:02 AM  Result Value Ref Range   Troponin I (High Sensitivity) 46 (H) <18 ng/L    Comment: (NOTE) Elevated high sensitivity troponin I (hsTnI) values and significant  changes across serial measurements may suggest ACS but many other  chronic and acute conditions are known to elevate hsTnI results.  Refer to the Links section for chest pain algorithms and additional  guidance. Performed at [facility], [place] [address] [place], [place] [address]   Hepatic function panel     Status: Abnormal   Collection Time: 02/24/20  8:02  AM  Result Value Ref Range   Total Protein 6.4 (L) 6.5 - 8.1 g/dL   Albumin 3.3 (L) 3.5 - 5.0 g/dL   AST 11 (L) 15 - 41 U/L   ALT 6 0 - 44 U/L   Alkaline Phosphatase 66 38 - 126 U/L   Total Bilirubin 0.9 0.3 - 1.2 mg/dL   Bilirubin, Direct <0.1 0.0 - 0.2 mg/dL   Indirect Bilirubin NOT CALCULATED 0.3 - 0.9 mg/dL    Comment: Performed at [facility] [address] [place], [place]  [address]  Lipase, blood     Status: None   Collection Time: 02/24/20  8:02 AM  Result Value Ref Range   Lipase 26 11 - 51 U/L    Comment: Performed at [facility] [address], [place], [place] [address]  Glucose, capillary     Status: Abnormal   Collection Time: 02/24/20 12:06 PM  Result Value Ref Range   Glucose-Capillary 142 (H) 70 - 99 mg/dL    Comment: Glucose reference range applies only to samples taken after fasting for at least 8 hours.  Glucose, capillary     Status: Abnormal   Collection Time: 02/24/20  4:29 PM  Result Value Ref Range   Glucose-Capillary 123 (H) 70 - 99 mg/dL    Comment: Glucose reference range applies only to samples taken after fasting for at least 8 hours.   Comment 1 Notify RN    Comment 2 Document in Chart   Vitamin B12     Status: None   Collection Time: 02/24/20  7:50 PM  Result Value Ref Range   Vitamin B-12 180 180 - 914 pg/mL    Comment: (NOTE) This assay is not validated for testing neonatal or myeloproliferative syndrome specimens for Vitamin B12 levels. Performed at [facility], [place] [address], [place], [place] [address]   Folate     Status: None   Collection Time: 02/24/20  7:50 PM  Result Value Ref Range   Folate 15.5 >5.9 ng/mL    Comment: Performed at [facility], [place] [address] [place], [place] [address]  Iron and TIBC     Status: Abnormal   Collection Time: 02/24/20  7:50 PM  Result Value Ref Range   Iron 40 (L) 45 - 182 ug/dL   TIBC 279 250 - 450 ug/dL   Saturation Ratios 14 (L) 17.9 - 39.5 %   UIBC 239 ug/dL    Comment: Performed at [facility], [place] [address], [place], [place] [address]  Ferritin     Status: None   Collection Time: 02/24/20  7:50 PM  Result Value Ref Range   Ferritin 24 24 - 336 ng/mL    Comment: Performed at [facility] [address], [place], [place] [address]  Reticulocytes     Status: Abnormal   Collection Time: 02/24/20  7:50 PM  Result Value Ref Range   Retic  Ct Pct 1.0 0.4 - 3.1 %   RBC. 3.68 (L) 4.22 - 5.81 MIL/uL   Retic Count, Absolute 38.3 19.0 - 186.0 K/uL   Immature Retic Fract 12.0 2.3 - 15.9 %    Comment: Performed at [facility] [address] [place], [place] [address]  Glucose, capillary     Status: Abnormal   Collection Time: 02/24/20  9:17 PM  Result Value Ref Range   Glucose-Capillary 155 (H) 70 - 99 mg/dL    Comment: Glucose  reference range applies only to samples taken after fasting for at least 8 hours.  Basic metabolic panel     Status: Abnormal   Collection Time: 02/25/20  3:21 AM  Result Value Ref Range   Sodium 142 135 - 145 mmol/L   Potassium 3.5 3.5 - 5.1 mmol/L   Chloride 106 98 - 111 mmol/L   CO2 24 22 - 32 mmol/L   Glucose, Bld 110 (H) 70 - 99 mg/dL    Comment: Glucose reference range applies only to samples taken after fasting for at least 8 hours.   BUN 29 (H) 8 - 23 mg/dL   Creatinine, Ser 3.12 (H) 0.61 - 1.24 mg/dL   Calcium 8.6 (L) 8.9 - 10.3 mg/dL   GFR calc non Af Amer 18 (L) >60 mL/min   GFR calc Af Amer 20 (L) >60 mL/min   Anion gap 12 5 - 15    Comment: Performed at [facility] [address], [place], [place] [address]  Glucose, capillary     Status: Abnormal   Collection Time: 02/25/20  8:02 AM  Result Value Ref Range   Glucose-Capillary 123 (H) 70 - 99 mg/dL    Comment: Glucose reference range applies only to samples taken after fasting for at least 8 hours.  Glucose, capillary     Status: Abnormal   Collection Time: 02/25/20 12:30 PM  Result Value Ref Range   Glucose-Capillary 177 (H) 70 - 99 mg/dL    Comment: Glucose reference range applies only to samples taken after fasting for at least 8 hours.    DG Chest 2 View  Result Date: 02/23/2020 CLINICAL DATA:  Chest pain 3-4 days with shortness of breath. EXAM: CHEST - 2 VIEW COMPARISON:  11/28/2019, 08/04/2019 and 03/27/2019 FINDINGS: Lungs are adequately inflated without acute airspace opacification or effusion. Stable right  perihilar density. Mild stable cardiomegaly. Remainder of the exam is unchanged. IMPRESSION: 1.  No acute cardiopulmonary disease. 2. Chronic stable faint focal right perihilar density. Stable cardiomegaly. Electronically Signed   By: Marin Olp M.D.   On: 02/23/2020 16:07    Review of Systems  Constitutional: Negative for chills, diaphoresis and fever.  Eyes: Negative for redness.  Respiratory: Positive for shortness of breath.   Cardiovascular: Positive for leg swelling. Negative for chest pain and palpitations.  Genitourinary: Negative for difficulty urinating.  Musculoskeletal: Negative for arthralgias.  Neurological: Negative for dizziness and syncope.   Blood pressure (!) 176/70, pulse 68, temperature 98.9 F (37.2 C), temperature source Oral, resp. rate 16, height 5\' 7"  (1.702 m), weight 79.4 kg, SpO2 96 %. Physical Exam HENT:     Head: Normocephalic and atraumatic.  Eyes:     Extraocular Movements: Extraocular movements intact.     Pupils: Pupils are equal, round, and reactive to light.  Neck:     Vascular: JVD present.  Cardiovascular:     Rate and Rhythm: Normal rate and regular rhythm.     Heart sounds: Murmur (2/6 systolic/diastolic murmur noted) heard.   Pulmonary:     Effort: Pulmonary effort is normal. No accessory muscle usage or respiratory distress.     Breath sounds: Normal breath sounds. No wheezing, rhonchi or rales.  Abdominal:     General: Bowel sounds are normal.     Palpations: Abdomen is soft.  Musculoskeletal:     Right lower leg: Edema (2+ edema noted) present.     Left lower leg: Edema (2+ edema noted) present.  Skin:  Coloration: Skin is not cyanotic.     Findings: No rash.     Nails: There is no clubbing.  Neurological:     General: No focal deficit present.     Mental Status: He is oriented to person, place, and time.     Assessment/Plan: Resolving acute congestive heart failure secondary to preserved LV systolic  function. Hypertensive heart disease with diastolic dysfunction. Hypertension. Diabetes mellitus. Hyperlipidemia. Cerebrovascular disease, history of PTCA in the remote past.  2.  Internal carotid artery. Chronic kidney disease stage IV. Anemia of chronic disease. History of carcinoma of the prostate. GERD Plan Agree with present management. Strict I andO's Restrict fluid to 1 L per 24 hours. Monitor daily weight. Agree with IV Lasix and  will switch to torsemide once fully compensated 2-D echo pending  Charolette Forward 02/25/2020, 12:51 PM

## 2020-02-26 DIAGNOSIS — D509 Iron deficiency anemia, unspecified: Secondary | ICD-10-CM

## 2020-02-26 DIAGNOSIS — Z8546 Personal history of malignant neoplasm of prostate: Secondary | ICD-10-CM

## 2020-02-26 DIAGNOSIS — I5033 Acute on chronic diastolic (congestive) heart failure: Secondary | ICD-10-CM

## 2020-02-26 DIAGNOSIS — E559 Vitamin D deficiency, unspecified: Secondary | ICD-10-CM

## 2020-02-26 LAB — BASIC METABOLIC PANEL
Anion gap: 9 (ref 5–15)
BUN: 34 mg/dL — ABNORMAL HIGH (ref 8–23)
CO2: 25 mmol/L (ref 22–32)
Calcium: 8.7 mg/dL — ABNORMAL LOW (ref 8.9–10.3)
Chloride: 107 mmol/L (ref 98–111)
Creatinine, Ser: 3.35 mg/dL — ABNORMAL HIGH (ref 0.61–1.24)
GFR calc Af Amer: 19 mL/min — ABNORMAL LOW (ref >60)
GFR calc non Af Amer: 16 mL/min — ABNORMAL LOW (ref >60)
Glucose, Bld: 129 mg/dL — ABNORMAL HIGH (ref 70–99)
Potassium: 3.7 mmol/L (ref 3.5–5.1)
Sodium: 141 mmol/L (ref 135–145)

## 2020-02-26 LAB — GLUCOSE, CAPILLARY
Glucose-Capillary: 120 mg/dL — ABNORMAL HIGH (ref 70–99)
Glucose-Capillary: 133 mg/dL — ABNORMAL HIGH (ref 70–99)

## 2020-02-26 MED ORDER — TORSEMIDE 20 MG PO TABS: 20.0000 mg | ORAL_TABLET | Freq: Two times a day (BID) | ORAL | 0 refills | Status: DC

## 2020-02-26 MED ORDER — CLONIDINE HCL 0.3 MG PO TABS: 0.3000 mg | ORAL_TABLET | Freq: Three times a day (TID) | ORAL | 0 refills | Status: DC

## 2020-02-26 NOTE — Progress Notes (Signed)
Subjective:  Patient denies any anginal chest pain.  States breathing and leg swelling has improved .  States recently reduced torsemide to once a day  Objective:  Vital Signs in the last 24 hours: Temp:  [98.3 F (36.8 C)-98.5 F (36.9 C)] 98.5 F (36.9 C) (07/07 0623) Pulse Rate:  [59-68] 65 (07/07 0623) Resp:  [16-17] 16 (07/07 0623) BP: (151-176)/(56-81) 166/81 (07/07 0623) SpO2:  [96 %-98 %] 96 % (07/07 0623) Weight:  [79.2 kg] 79.2 kg (07/07 0623)  Intake/Output from previous day: 07/06 0701 - 07/07 0700 In: 1380 [P.O.:1380] Out: 1530 [Urine:1530] Intake/Output from this shift: Total I/O In: -  Out: 400 [Urine:400]  Physical Exam: Neck: no adenopathy, no carotid bruit, no JVD and supple, symmetrical, trachea midline Lungs: clear to auscultation bilaterally Heart: regular rate and rhythm, S1, S2 normal and 2/6 systolic and diastolic murmur noted Abdomen: soft, non-tender; bowel sounds normal; no masses,  no organomegaly Extremities: no clubbing, cyanosis, 1+ edema noted  Lab Results: Recent Labs    02/23/20 1559 02/24/20 0339  WBC 7.3 8.1  HGB 10.3* 9.6*  PLT 289 270   Recent Labs    02/25/20 0321 02/26/20 0724  NA 142 141  K 3.5 3.7  CL 106 107  CO2 24 25  GLUCOSE 110* 129*  BUN 29* 34*  CREATININE 3.12* 3.35*   No results for input(s): TROPONINI in the last 72 hours.  Invalid input(s): CK, MB Hepatic Function Panel Recent Labs    02/24/20 0802  PROT 6.4*  ALBUMIN 3.3*  AST 11*  ALT 6  ALKPHOS 66  BILITOT 0.9  BILIDIR <0.1  IBILI NOT CALCULATED   No results for input(s): CHOL in the last 72 hours. No results for input(s): PROTIME in the last 72 hours.  Imaging: Imaging results have been reviewed  Cardiac Studies:  Assessment/Plan:  Resolving acute congestive heart failure secondary to preserved LV systolic function. Hypertensive heart disease with diastolic dysfunction. Hypertension. Diabetes  mellitus. Hyperlipidemia. Cerebrovascular disease, history of PTCA in the remote past.  2.  Internal carotid artery. Chronic kidney disease stage IV. Anemia of chronic disease. History of carcinoma of the prostate. Plan Change Lasix to torsemide 20 mg twice daily. Okay to discharge from cardiac point of view. Follow-up with me in one week. Heart failure.  Instructions have been given. Follow-up with renal service as scheduled  LOS: 3 days    Charolette Forward 02/26/2020, 9:24 AM

## 2020-02-26 NOTE — Discharge Summary (Signed)
. Physician Discharge Summary  Austin [name] WCB:[id number] DOB: 12-Jun-1935 DOA: 02/23/2020  PCP: Dorothyann Peng, NP  Admit date: 02/23/2020 Discharge date: 02/26/2020  Admitted From: Home Disposition:  Discharged to home.   Recommendations for Outpatient Follow-up:  1. Follow up with PCP in 1 week. 2. Follow up with Cardiology in 1 week. 3. Please obtain BMP/CBC in one week.  Discharge Condition: Stable  CODE STATUS: FULL   Brief/Interim Summary: 85 year old gentleman prior history of chronic diastolic heart failure, hypertension, stage IV CKD follows up with Dr. Royce Macadamia, outpatient, presents to ED for worsening shortness of breath.  Recently patient reports that his diuretics were changed from Lasix to torsemide but patient continues to have progressive shortness of breath with worsening pedal edema and unable to lie flat associated with some dry cough and presented to ED for further evaluation.  Initial chest x-ray on admission shows chronic changes.  EKG shows sinus rhythm.  BNP was elevated at 1700, creatinine around 3.  Patient was admitted for evaluation and management of acute on chronic diastolic heart failure and was started on IV Lasix 40 mg twice daily.  He has diuresed about 2.2 lit in the last 24 hours with slight bump in creatinine.  Pt seen and examined,reports breathing has improved.  Denies any chest pain, denies any nausea vomiting or abdominal pain.  02/26/20: Resting comfortably today. Denies any dyspnea. Satting 100% on RA. Cardiology has seen today and says he's ok for d/c from their standpoint. He will need follow up with them in a week. He will be discharged to home.   Discharge Diagnoses:  Active Problems:   Diabetes mellitus with renal complications (HCC)   PROSTATE CANCER, HX OF   Elevated serum immunoglobulin free light chains   Hypertensive urgency   CKD (chronic kidney disease), stage IV (HCC)   Acute CHF (congestive heart failure) (HCC)  Acute on chronic diastolic  heart failure.     - started on lasix IV 32m BID     - seen by cardiology; rec transition to demadex 218mBID and follow up with them in 1 week; ok for d/c per their standpoint     - down 1.75 liters during admission     - says respiratory status is ok today; sats 100% on RA     - echo: Left Ventricle: Left ventricular ejection fraction, by estimation, is 50 to 55%. The left ventricle has low normal function. The left ventricle has no regional wall motion abnormalities. The left ventricular internal cavity size was mildly dilated. There is mild left ventricular hypertrophy. Left ventricular diastolic parameters are consistent with Grade II diastolic dysfunction (pseudonormalization). Elevated left atrial pressure.      - EKG shows nonspecific ST or T wave changes unchanged from April.  Hypertensive urgency     - continue clonidine to 0.3 mg 3 times daily, Norvasc 10 mg daily, BiDil 20 mg/37.51 tab 3 times daily and metoprolol 100 mg daily.     - follow up with PCP and cardiology in 1 week.  History of BPH     - Continue Cardura 2 mg daily at bedtime  Type 2 diabetes mellitus with renal complications.     - Last hemoglobin A1c is around 6.2.     - continue home glypizide at discharge  Hyperlipidemia     - continue home lipitor at discharge  GERD     - continue nexium at discharge  Hypokalemia     - Replaced, resolved  Anemia  of chronic disease Fe-deficiency anemia     - Baseline hemoglobin between 9-10.     - FOBT negative; no evidence of bleed     - continue iron supplementation at discharge  Stage IV CKD     - SCr baseline is around 3.       - He is near baseline, follow up with nephrology outpt.  Vitamin D deficiency     - continue vitamin D supplementation at discharge  Discharge Instructions   Allergies as of 02/26/2020      Reactions   Aspirin Other (See Comments)   High doses causes stomach ulcer and bleeding      Medication List    STOP taking these  medications   furosemide 40 MG tablet Commonly known as: Lasix   losartan 25 MG tablet Commonly known as: COZAAR     TAKE these medications   Accu-Chek Aviva Plus test strip Generic drug: glucose blood Used to check blood glucose BID or PRN What changed:   how much to take  how to take this  when to take this   Accu-Chek Aviva Plus w/Device Kit Used to check blood glucose 2 times a day or PRN What changed:   how much to take  how to take this  when to take this   accu-chek multiclix lancets Used to check blood glucose BID or PRN What changed:   how much to take  how to take this  when to take this   acetaminophen 325 MG tablet Commonly known as: TYLENOL Take 650 mg by mouth every 6 (six) hours as needed for mild pain or moderate pain.   amLODipine 10 MG tablet Commonly known as: NORVASC TAKE 1 TABLET BY MOUTH EVERY DAY   atorvastatin 20 MG tablet Commonly known as: LIPITOR TAKE 1 TABLET BY MOUTH EVERY DAY What changed: when to take this   cloNIDine 0.3 MG tablet Commonly known as: CATAPRES Take 1 tablet (0.3 mg total) by mouth 3 (three) times daily. What changed:   medication strength  how much to take   clopidogrel 75 MG tablet Commonly known as: PLAVIX TAKE 1 TABLET BY MOUTH EVERY DAY   [employer] D3 25 MCG (1000 UT) capsule Generic drug: Cholecalciferol Take 1,000 Units by mouth daily.   doxazosin 2 MG tablet Commonly known as: CARDURA Take 2 mg by mouth at bedtime.   esomeprazole 40 MG capsule Commonly known as: NEXIUM TAKE 1 CAPSULE BY MOUTH EVERY DAY What changed:   how much to take  how to take this  when to take this  additional instructions   glipiZIDE 5 MG 24 hr tablet Commonly known as: GLUCOTROL XL TAKE 1 TABLET BY MOUTH EVERY DAY   hydrocortisone 2.5 % rectal cream Commonly known as: ANUSOL-HC PLACE 1 APPLICATION RECTALLY 2 TIMES A DAY. What changed: See the new instructions.   isosorbide-hydrALAZINE 20-37.5 MG  tablet Commonly known as: BIDIL Take 1 tablet by mouth in the morning, at noon, and at bedtime.   Klor-Con M10 10 MEQ tablet Generic drug: potassium chloride Take 10 mEq by mouth daily.   lactulose 10 GM/15ML solution Commonly known as: CHRONULAC TAKE 15 ML BY MOUTH 2 TIMES DAILY AS NEEDED FOR MILD CONSTIPATION. What changed: See the new instructions.   metoprolol succinate 100 MG 24 hr tablet Commonly known as: TOPROL-XL Take 100 mg by mouth daily.   Poly-Iron 150 150 MG capsule Generic drug: iron polysaccharides Take 150 mg by mouth daily.   SYSTANE  BALANCE OP Place 1 drop into both eyes daily as needed.   torsemide 20 MG tablet Commonly known as: DEMADEX Take 1 tablet (20 mg total) by mouth 2 (two) times daily. What changed: when to take this   triamcinolone cream 0.1 % Commonly known as: KENALOG Apply 1 application topically 2 (two) times daily.       Allergies  Allergen Reactions  . Aspirin Other (See Comments)    High doses causes stomach ulcer and bleeding    Consultations:  Cardiology  Procedures/Studies: DG Chest 2 View  Result Date: 02/23/2020 CLINICAL DATA:  Chest pain 3-4 days with shortness of breath. EXAM: CHEST - 2 VIEW COMPARISON:  11/28/2019, 08/04/2019 and 03/27/2019 FINDINGS: Lungs are adequately inflated without acute airspace opacification or effusion. Stable right perihilar density. Mild stable cardiomegaly. Remainder of the exam is unchanged. IMPRESSION: 1.  No acute cardiopulmonary disease. 2. Chronic stable faint focal right perihilar density. Stable cardiomegaly. Electronically Signed   By: Marin Olp M.D.   On: 02/23/2020 16:07   ECHOCARDIOGRAM COMPLETE  Result Date: 02/25/2020    ECHOCARDIOGRAM REPORT   Patient Name:   Austin [name] Date of Exam: 02/25/2020 Medical Rec #:  [id number]  Height:       67.0 in Accession #:    [id number] Weight:       175.1 lb Date of Birth:  11-Aug-1935  BSA:          1.911 m Patient Age:    85 years   BP:            153/65 mmHg Patient Gender: M          HR:           72 bpm. Exam Location:  Inpatient Procedure: 2D Echo, Cardiac Doppler and Color Doppler Indications:    I50.9* Heart failure (unspecified)  History:        Patient has prior history of Echocardiogram examinations, most                 recent 03/28/2019. CHF, Stroke; Risk Factors:Diabetes,                 Hypertension and Dyslipidemia. Cancer.  Sonographer:    Roseanna Rainbow RDCS Referring Phys: Theola Sequin IMPRESSIONS  1. Normal LV systolic function; mild LVE; mild LVH; grade 2 diastolic dysfunction; mild to moderate AI; severe LAE; mild RAE and RVE; mild TR.  2. Left ventricular ejection fraction, by estimation, is 50 to 55%. The left ventricle has low normal function. The left ventricle has no regional wall motion abnormalities. The left ventricular internal cavity size was mildly dilated. There is mild left ventricular hypertrophy. Left ventricular diastolic parameters are consistent with Grade II diastolic dysfunction (pseudonormalization). Elevated left atrial pressure.  3. Right ventricular systolic function is normal. The right ventricular size is mildly enlarged. There is moderately elevated pulmonary artery systolic pressure.  4. Left atrial size was severely dilated.  5. Right atrial size was mildly dilated.  6. The mitral valve is normal in structure. Trivial mitral valve regurgitation. No evidence of mitral stenosis.  7. The aortic valve is tricuspid. Aortic valve regurgitation is moderate. Mild aortic valve sclerosis is present, with no evidence of aortic valve stenosis.  8. The inferior vena cava is dilated in size with >50% respiratory variability, suggesting right atrial pressure of 8 mmHg. FINDINGS  Left Ventricle: Left ventricular ejection fraction, by estimation, is 50 to 55%. The left ventricle has low normal function. The  left ventricle has no regional wall motion abnormalities. The left ventricular internal cavity size was mildly dilated.  There is mild left ventricular hypertrophy. Left ventricular diastolic parameters are consistent with Grade II diastolic dysfunction (pseudonormalization). Elevated left atrial pressure. Right Ventricle: The right ventricular size is mildly enlarged.Right ventricular systolic function is normal. There is moderately elevated pulmonary artery systolic pressure. The tricuspid regurgitant velocity is 3.11 m/s, and with an assumed right atrial pressure of 8 mmHg, the estimated right ventricular systolic pressure is 16.1 mmHg. Left Atrium: Left atrial size was severely dilated. Right Atrium: Right atrial size was mildly dilated. Pericardium: A small pericardial effusion is present. Mitral Valve: The mitral valve is normal in structure. Normal mobility of the mitral valve leaflets. Mild mitral annular calcification. Trivial mitral valve regurgitation. No evidence of mitral valve stenosis. Tricuspid Valve: The tricuspid valve is normal in structure. Tricuspid valve regurgitation is mild . No evidence of tricuspid stenosis. Aortic Valve: The aortic valve is tricuspid. Aortic valve regurgitation is moderate. Aortic regurgitation PHT measures 726 msec. Mild aortic valve sclerosis is present, with no evidence of aortic valve stenosis. Aortic valve mean gradient measures 5.0 mmHg. Aortic valve peak gradient measures 10.2 mmHg. Aortic valve area, by VTI measures 2.69 cm. Pulmonic Valve: The pulmonic valve was not well visualized. Pulmonic valve regurgitation is not visualized. No evidence of pulmonic stenosis. Aorta: The aortic root is normal in size and structure. Venous: The inferior vena cava is dilated in size with greater than 50% respiratory variability, suggesting right atrial pressure of 8 mmHg. IAS/Shunts: No atrial level shunt detected by color flow Doppler. Additional Comments: Normal LV systolic function; mild LVE; mild LVH; grade 2 diastolic dysfunction; mild to moderate AI; severe LAE; mild RAE and RVE; mild TR.   LEFT VENTRICLE PLAX 2D LVIDd:         5.66 cm      Diastology LVIDs:         3.63 cm      LV e' lateral:   4.16 cm/s LV PW:         1.65 cm      LV E/e' lateral: 21.6 LV IVS:        1.28 cm      LV e' medial:    4.16 cm/s LVOT diam:     2.20 cm      LV E/e' medial:  21.6 LV SV:         84 LV SV Index:   44 LVOT Area:     3.80 cm  LV Volumes (MOD) LV vol d, MOD A2C: 169.0 ml LV vol d, MOD A4C: 117.0 ml LV vol s, MOD A2C: 78.7 ml LV vol s, MOD A4C: 61.8 ml LV SV MOD A2C:     90.3 ml LV SV MOD A4C:     117.0 ml LV SV MOD BP:      76.0 ml RIGHT VENTRICLE             IVC RV S prime:     13.50 cm/s  IVC diam: 2.47 cm TAPSE (M-mode): 1.9 cm LEFT ATRIUM             Index       RIGHT ATRIUM           Index LA diam:        4.40 cm 2.30 cm/m  RA Area:     21.70 cm LA Vol (A2C):   82.7 ml 43.27 ml/m RA Volume:  67.90 ml  35.53 ml/m LA Vol (A4C):   83.6 ml 43.74 ml/m LA Biplane Vol: 89.6 ml 46.88 ml/m  AORTIC VALVE AV Area (Vmax):    2.92 cm AV Area (Vmean):   3.19 cm AV Area (VTI):     2.69 cm AV Vmax:           160.00 cm/s AV Vmean:          97.600 cm/s AV VTI:            0.311 m AV Peak Grad:      10.2 mmHg AV Mean Grad:      5.0 mmHg LVOT Vmax:         123.00 cm/s LVOT Vmean:        82.000 cm/s LVOT VTI:          0.220 m LVOT/AV VTI ratio: 0.71 AI PHT:            726 msec  AORTA Ao Root diam: 3.70 cm Ao Asc diam:  3.50 cm MITRAL VALVE               TRICUSPID VALVE MV Area (PHT): 4.55 cm    TR Peak grad:   38.7 mmHg MV Decel Time: 167 msec    TR Vmax:        311.00 cm/s MV E velocity: 89.67 cm/s                            SHUNTS                            Systemic VTI:  0.22 m                            Systemic Diam: 2.20 cm Kirk Ruths MD Electronically signed by Kirk Ruths MD Signature Date/Time: 02/25/2020/4:22:02 PM    Final      Subjective: "That'll be fine."  Discharge Exam: Vitals:   02/26/20 0623 02/26/20 0923  BP: (!) 166/81 (!) 146/52  Pulse: 65 61  Resp: 16   Temp: 98.5 F (36.9 C)    SpO2: 96%    Vitals:   02/25/20 1623 02/25/20 1922 02/26/20 0623 02/26/20 0923  BP: (!) 151/56 (!) 160/67 (!) 166/81 (!) 146/52  Pulse:  (!) 59 65 61  Resp:  17 16   Temp:  98.5 F (36.9 C) 98.5 F (36.9 C)   TempSrc:  Oral Oral   SpO2:  98% 96%   Weight:   79.2 kg   Height:        General: 85 y.o. male resting in bed in NAD Cardiovascular: brady, +S1, S2, no g/r, 1/6 SEM, equal pulses throughout Respiratory: CTABL, no w/r/r, normal WOB GI: BS+, NDNT, no masses noted, no organomegaly noted MSK: No c/c; BLE 1+ edema Neuro: Alert to name, follows commands Psyc: Appropriate interaction and affect, calm/cooperative   The results of significant diagnostics from this hospitalization (including imaging, microbiology, ancillary and laboratory) are listed below for reference.     Microbiology: Recent Results (from the past 240 hour(s))  SARS Coronavirus 2 by RT PCR (hospital order, performed in [facility] hospital lab) Nasopharyngeal Nasopharyngeal Swab     Status: None   Collection Time: 02/23/20  8:22 PM   Specimen: Nasopharyngeal Swab  Result Value Ref Range Status   SARS Coronavirus 2 NEGATIVE NEGATIVE  Final    Comment: (NOTE) SARS-CoV-2 target nucleic acids are NOT DETECTED.  The SARS-CoV-2 RNA is generally detectable in upper and lower respiratory specimens during the acute phase of infection. The lowest concentration of SARS-CoV-2 viral copies this assay can detect is 250 copies / mL. A negative result does not preclude SARS-CoV-2 infection and should not be used as the sole basis for treatment or other patient management decisions.  A negative result may occur with improper specimen collection / handling, submission of specimen other than nasopharyngeal swab, presence of viral mutation(s) within the areas targeted by this assay, and inadequate number of viral copies (<250 copies / mL). A negative result must be combined with clinical observations, patient history,  and epidemiological information.  Fact Sheet for Patients:   StrictlyIdeas.no  Fact Sheet for Healthcare Providers: BankingDealers.co.za  This test is not yet approved or  cleared by the [place] FDA and has been authorized for detection and/or diagnosis of SARS-CoV-2 by FDA under an Emergency Use Authorization (EUA).  This EUA will remain in effect (meaning this test can be used) for the duration of the COVID-19 declaration under Section 564(b)(1) of the Act, 21 U.S.C. section 360bbb-3(b)(1), unless the authorization is terminated or revoked sooner.  Performed at [facility], [place] [address] [place], [place] [address]      Labs: BNP (last 3 results) Recent Labs    08/04/19 0508 11/28/19 0529 02/23/20 1559  BNP 1,062.0* 1,296.2* 6,943.7*   Basic Metabolic Panel: Recent Labs  Lab 02/23/20 1559 02/24/20 0339 02/25/20 0321 02/26/20 0724  NA 142 142 142 141  K 3.3* 3.2* 3.5 3.7  CL 111 109 106 107  CO2 '23 24 24 25  ' GLUCOSE 67* 135* 110* 129*  BUN 28* 26* 29* 34*  CREATININE 3.09* 2.92* 3.12* 3.35*  CALCIUM 8.8* 8.7* 8.6* 8.7*  MG  --  2.0  --   --    Liver Function Tests: Recent Labs  Lab 02/24/20 0802  AST 11*  ALT 6  ALKPHOS 66  BILITOT 0.9  PROT 6.4*  ALBUMIN 3.3*   Recent Labs  Lab 02/24/20 0802  LIPASE 26   No results for input(s): AMMONIA in the last 168 hours. CBC: Recent Labs  Lab 02/23/20 1559 02/24/20 0339  WBC 7.3 8.1  HGB 10.3* 9.6*  HCT 32.6* 30.4*  MCV 82.5 81.9  PLT 289 270   Cardiac Enzymes: No results for input(s): CKTOTAL, CKMB, CKMBINDEX, TROPONINI in the last 168 hours. BNP: Invalid input(s): POCBNP CBG: Recent Labs  Lab 02/25/20 1230 02/25/20 1741 02/25/20 2221 02/26/20 0746 02/26/20 1201  GLUCAP 177* 118* 134* 120* 133*   D-Dimer No results for input(s): DDIMER in the last 72 hours. Hgb A1c No results for input(s): HGBA1C in the last 72 hours. Lipid  Profile No results for input(s): CHOL, HDL, LDLCALC, TRIG, CHOLHDL, LDLDIRECT in the last 72 hours. Thyroid function studies Recent Labs    02/24/20 0339  TSH 0.887   Anemia work up Recent Labs    02/24/20 1950  VITAMINB12 180  FOLATE 15.5  FERRITIN 24  TIBC 279  IRON 40*  RETICCTPCT 1.0   Urinalysis    Component Value Date/Time   COLORURINE YELLOW 04/03/2019 1640   APPEARANCEUR CLEAR 04/03/2019 1640   LABSPEC 1.017 04/03/2019 1640   PHURINE 5.0 04/03/2019 1640   GLUCOSEU 50 (A) 04/03/2019 1640   HGBUR NEGATIVE 04/03/2019 1640   HGBUR negative 09/19/2007 0000   BILIRUBINUR NEGATIVE 04/03/2019 1640  BILIRUBINUR n 11/18/2014 1003   KETONESUR NEGATIVE 04/03/2019 1640   PROTEINUR >=300 (A) 04/03/2019 1640   UROBILINOGEN 0.2 11/18/2014 1003   UROBILINOGEN 1.0 01/26/2013 2059   NITRITE NEGATIVE 04/03/2019 1640   LEUKOCYTESUR NEGATIVE 04/03/2019 1640   Sepsis Labs Invalid input(s): PROCALCITONIN,  WBC,  LACTICIDVEN Microbiology Recent Results (from the past 240 hour(s))  SARS Coronavirus 2 by RT PCR (hospital order, performed in [facility] hospital lab) Nasopharyngeal Nasopharyngeal Swab     Status: None   Collection Time: 02/23/20  8:22 PM   Specimen: Nasopharyngeal Swab  Result Value Ref Range Status   SARS Coronavirus 2 NEGATIVE NEGATIVE Final    Comment: (NOTE) SARS-CoV-2 target nucleic acids are NOT DETECTED.  The SARS-CoV-2 RNA is generally detectable in upper and lower respiratory specimens during the acute phase of infection. The lowest concentration of SARS-CoV-2 viral copies this assay can detect is 250 copies / mL. A negative result does not preclude SARS-CoV-2 infection and should not be used as the sole basis for treatment or other patient management decisions.  A negative result may occur with improper specimen collection / handling, submission of specimen other than nasopharyngeal swab, presence of viral mutation(s) within the areas targeted by this  assay, and inadequate number of viral copies (<250 copies / mL). A negative result must be combined with clinical observations, patient history, and epidemiological information.  Fact Sheet for Patients:   StrictlyIdeas.no  Fact Sheet for Healthcare Providers: BankingDealers.co.za  This test is not yet approved or  cleared by the [place] FDA and has been authorized for detection and/or diagnosis of SARS-CoV-2 by FDA under an Emergency Use Authorization (EUA).  This EUA will remain in effect (meaning this test can be used) for the duration of the COVID-19 declaration under Section 564(b)(1) of the Act, 21 U.S.C. section 360bbb-3(b)(1), unless the authorization is terminated or revoked sooner.  Performed at [facility], [place] [address] [place], [place] [address]      Time coordinating discharge: Over 35 minutes  SIGNED:   Jonnie Finner, DO  [facility] 02/26/2020, 1:41 PM   If 7PM-7AM, please contact night-coverage [email or web address]

## 2020-02-26 NOTE — Discharge Instructions (Signed)
Heart Failure, Diagnosis  Heart failure means that your heart is not able to pump blood in the right way. This makes it hard for your body to work well. Heart failure is usually a long-term (chronic) condition. You must take good care of yourself and follow your treatment plan from your doctor. What are the causes? This condition may be caused by:  High blood pressure.  Build up of cholesterol and fat in the arteries.  Heart attack. This injures the heart muscle.  Heart valves that do not open and close properly.  Damage of the heart muscle. This is also called cardiomyopathy.  Lung disease.  Abnormal heart rhythms. What increases the risk? The risk of heart failure goes up as a person ages. This condition is also more likely to develop in people who:  Are overweight.  Are male.  Smoke or chew tobacco.  Abuse alcohol or illegal drugs.  Have taken medicines that can damage the heart.  Have diabetes.  Have abnormal heart rhythms.  Have thyroid problems.  Have low blood counts (anemia). What are the signs or symptoms? Symptoms of this condition include:  Shortness of breath.  Coughing.  Swelling of the feet, ankles, legs, or belly.  Losing weight for no reason.  Trouble breathing.  Waking from sleep because of the need to sit up and get more air.  Rapid heartbeat.  Being very tired.  Feeling dizzy, or feeling like you may pass out (faint).  Having no desire to eat.  Feeling like you may vomit (nauseous).  Peeing (urinating) more at night.  Feeling confused. How is this treated?     This condition may be treated with:  Medicines. These can be given to treat blood pressure and to make the heart muscles stronger.  Changes in your daily life. These may include eating a healthy diet, staying at a healthy body weight, quitting tobacco and illegal drug use, or doing exercises.  Surgery. Surgery can be done to open blocked valves, or to put devices in  the heart, such as pacemakers.  A donor heart (heart transplant). You will receive a healthy heart from a donor. Follow these instructions at home:  Treat other conditions as told by your doctor. These may include high blood pressure, diabetes, thyroid disease, or abnormal heart rhythms.  Learn as much as you can about heart failure.  Get support as you need it.  Keep all follow-up visits as told by your doctor. This is important. Summary  Heart failure means that your heart is not able to pump blood in the right way.  This condition is caused by high blood pressure, heart attack, or damage of the heart muscle.  Symptoms of this condition include shortness of breath and swelling of the feet, ankles, legs, or belly. You may also feel very tired or feel like you may vomit.  You may be treated with medicines, surgery, or changes in your daily life.  Treat other health conditions as told by your doctor. This information is not intended to replace advice given to you by your health care provider. Make sure you discuss any questions you have with your health care provider. Document Revised: 10/26/2018 Document Reviewed: 10/26/2018 Elsevier Patient Education  2020 [employer]   Heart Failure, Self Care Heart failure is a serious condition. This sheet explains things you need to do to take care of yourself at home. To help you stay as healthy as possible, you may be asked to change your diet, take   certain medicines, and make other changes in your life. Your doctor may also give you more specific instructions. If you have problems or questions, call your doctor. What are the risks? Having heart failure makes it more likely for you to have some problems. These problems can get worse if you do not take good care of yourself. Problems may include:  Blood clotting problems. This may cause a stroke.  Damage to the kidneys, liver, or lungs.  Abnormal heart rhythms. Supplies needed:  Scale  for weighing yourself.  Blood pressure monitor.  Notebook.  Medicines. How to care for yourself when you have heart failure Medicines Take over-the-counter and prescription medicines only as told by your doctor. Take your medicines every day.  Do not stop taking your medicine unless your doctor tells you to do so.  Do not skip any medicines.  Get your prescriptions refilled before you run out of medicine. This is important. Eating and drinking   Eat heart-healthy foods. Talk with a diet specialist (dietitian) to create an eating plan.  Choose foods that: ? Have no trans fat. ? Are low in saturated fat and cholesterol.  Choose healthy foods, such as: ? Fresh or frozen fruits and vegetables. ? Fish. ? Low-fat (lean) meats. ? Legumes, such as beans, peas, and lentils. ? Fat-free or low-fat dairy products. ? Whole-grain foods. ? High-fiber foods.  Limit salt (sodium) if told by your doctor. Ask your diet specialist to tell you which seasonings are healthy for your heart.  Cook in healthy ways instead of frying. Healthy ways of cooking include roasting, grilling, broiling, baking, poaching, steaming, and stir-frying.  Limit how much fluid you drink, if told by your doctor. Alcohol use  Do not drink alcohol if: ? Your doctor tells you not to drink. ? Your heart was damaged by alcohol, or you have very bad heart failure. ? You are pregnant, may be pregnant, or are planning to become pregnant.  If you drink alcohol: ? Limit how much you use to:  0-1 drink a day for women.  0-2 drinks a day for men. ? Be aware of how much alcohol is in your drink. In the [place] one drink equals one 12 oz bottle of beer (355 mL), one 5 oz glass of wine (148 mL), or one 1 oz glass of hard liquor (44 mL). Lifestyle   Do not use any products that contain nicotine or tobacco, such as cigarettes, e-cigarettes, and chewing tobacco. If you need help quitting, ask your doctor. ? Do not use  nicotine gum or patches before talking to your doctor.  Do not use illegal drugs.  Lose weight if told by your doctor.  Do physical activity if told by your doctor. Talk to your doctor before you begin an exercise if: ? You are an older adult. ? You have very bad heart failure.  Learn to manage stress. If you need help, ask your doctor.  Get rehab (rehabilitation) to help you stay independent and to help with your quality of life.  Plan time to rest when you get tired. Check weight and blood pressure   Weigh yourself every day. This will help you to know if fluid is building up in your body. ? Weigh yourself every morning after you pee (urinate) and before you eat breakfast. ? Wear the same amount of clothing each time. ? Write down your daily weight. Give your record to your doctor.  Check and write down your blood pressure as told by   your doctor.  Check your pulse as told by your doctor. Dealing with very hot and very cold weather  If it is very hot: ? Avoid activities that take a lot of energy. ? Use air conditioning or fans, or find a cooler place. ? Avoid caffeine and alcohol. ? Wear clothing that is loose-fitting, lightweight, and light-colored.  If it is very cold: ? Avoid activities that take a lot of energy. ? Layer your clothes. ? Wear mittens or gloves, a hat, and a scarf when you go outside. ? Avoid alcohol. Follow these instructions at home:  Stay up to date with shots (vaccines). Get pneumococcal and flu (influenza) shots.  Keep all follow-up visits as told by your doctor. This is important. Contact a doctor if:  You gain weight quickly.  You have increasing shortness of breath.  You cannot do your normal activities.  You get tired easily.  You cough a lot.  You don't feel like eating or feel like you may vomit (nauseous).  You become puffy (swell) in your hands, feet, ankles, or belly (abdomen).  You cannot sleep well because it is hard to  breathe.  You feel like your heart is beating fast (palpitations).  You get dizzy when you stand up. Get help right away if:  You have trouble breathing.  You or someone else notices a change in your behavior, such as having trouble staying awake.  You have chest pain or discomfort.  You pass out (faint). These symptoms may be an emergency. Do not wait to see if the symptoms will go away. Get medical help right away. Call your local emergency services (911 in the [place] Do not drive yourself to the hospital. Summary  Heart failure is a serious condition. To care for yourself, you may have to change your diet, take medicines, and make other lifestyle changes.  Take your medicines every day. Do not stop taking them unless your doctor tells you to do so.  Eat heart-healthy foods, such as fresh or frozen fruits and vegetables, fish, lean meats, legumes, fat-free or low-fat dairy products, and whole-grain or high-fiber foods.  Ask your doctor if you can drink alcohol. You may have to stop alcohol use if you have very bad heart failure.  Contact your doctor if you gain weight quickly or feel that your heart is beating too fast. Get help right away if you pass out, or have chest pain or trouble breathing. This information is not intended to replace advice given to you by your health care provider. Make sure you discuss any questions you have with your health care provider. Document Revised: 11/20/2018 Document Reviewed: 11/21/2018 Elsevier Patient Education  2020 [employer]  

## 2020-02-26 NOTE — Care Management Important Message (Signed)
Important Message  Patient Details  Name: Curby Carswell MRN: [id number] Date of Birth: 18-Dec-1934   Medicare Important Message Given:  Yes     [name] 02/26/2020, 8:47 AM

## 2020-02-26 NOTE — Evaluation (Signed)
Physical Therapy Evaluation Patient Details Name: Austin [name] MRN: [id number] DOB: 17-Dec-1934 Today's Date: 02/26/2020   History of Present Illness  Pt is 85 yo male with PMH of CHF, HTN, prostate CA, and stage IV CKD who presented to the ED with [facility]. Pt admitted with acute CHF.  Clinical Impression  Pt admitted with above diagnosis.  He presents with baseline mobility.  Pt was able to demonstrate transfers and gait safely and independently.   All VSS during evaluation.  No further acute PT indicated.     Follow Up Recommendations No PT follow up    Equipment Recommendations  None recommended by PT    Recommendations for Other Services       Precautions / Restrictions Precautions Precautions: None      Mobility  Bed Mobility Overal bed mobility: Independent                Transfers Overall transfer level: Independent Equipment used: None             General transfer comment: Pt has been ambulating in room independently  Ambulation/Gait Ambulation/Gait assistance: Supervision Gait Distance (Feet): 300 Feet Assistive device: None Gait Pattern/deviations: WFL(Within Functional Limits) Gait velocity: decreased   General Gait Details: [facility]  Stairs            Wheelchair Mobility    Modified Rankin (Stroke Patients Only)       Balance Overall balance assessment: Independent   Sitting balance-Leahy Scale: Normal       Standing balance-Leahy Scale: Good               High level balance activites: Direction changes;Turns;Sudden stops;Head turns               Pertinent Vitals/Pain Pain Assessment: No/denies pain    Home Living Family/patient expects to be discharged to:: Private residence Living Arrangements: Spouse/significant other Available Help at Discharge: Family;Available 24 hours/day Type of Home: House Home Access: Stairs to enter Entrance Stairs-Rails: Left Entrance Stairs-Number of Steps: 4 from the garage Home  Layout: Multi-level;Bed/bath upstairs;1/2 bath on main level Home Equipment: None      Prior Function Level of Independence: Independent         Comments: Hydrographic surveyor, independent with ADL's, drives, takes care of great-grandson on the weekends     Hand Dominance   Dominant Hand: Right    Extremity/Trunk Assessment   Upper Extremity Assessment Upper Extremity Assessment: Overall WFL for tasks assessed    Lower Extremity Assessment Lower Extremity Assessment: Overall WFL for tasks assessed    Cervical / Trunk Assessment Cervical / Trunk Assessment: Normal  Communication   Communication: No difficulties  Cognition Arousal/Alertness: Awake/alert Behavior During Therapy: WFL for tasks assessed/performed Overall Cognitive Status: Within Functional Limits for tasks assessed                                        General Comments General comments (skin integrity, edema, etc.): VSS on RA    Exercises     Assessment/Plan    PT Assessment Patent does not need any further PT services  PT Problem List         PT Treatment Interventions      PT Goals (Current goals can be found in the Care Plan section)  Acute Rehab PT Goals Patient Stated Goal: return home PT Goal Formulation: All assessment and education complete, DC  therapy Potential to Achieve Goals: Good    Frequency     Barriers to discharge        Co-evaluation               AM-PAC PT "6 Clicks" Mobility  Outcome Measure Help needed turning from your back to your side while in a flat bed without using bedrails?: None Help needed moving from lying on your back to sitting on the side of a flat bed without using bedrails?: None Help needed moving to and from a bed to a chair (including a wheelchair)?: None Help needed standing up from a chair using your arms (e.g., wheelchair or bedside chair)?: None Help needed to walk in hospital room?: None Help needed climbing 3-5 steps  with a railing? : None 6 Click Score: 24    End of Session   Activity Tolerance: Patient tolerated treatment well Patient left: in chair;with family/visitor present;Other (comment) (safe to ambulate independently) Nurse Communication: Mobility status      Time: 1400-1420 PT Time Calculation (min) (ACUTE ONLY): 20 min   Charges:   PT Evaluation $PT Eval Low Complexity: 1 Low          Ha Shannahan, PT Acute Rehab Services Pager [phone] [facility] [phone]    Karlton Lemon 02/26/2020, 2:26 PM

## 2020-02-27 ENCOUNTER — Telehealth: Payer: Self-pay | Admitting: *Deleted

## 2020-02-27 ENCOUNTER — Telehealth: Payer: Self-pay | Admitting: Adult Health

## 2020-02-27 NOTE — Telephone Encounter (Signed)
Pt is returning your call

## 2020-02-27 NOTE — Telephone Encounter (Signed)
Transition Care Management Follow-up Telephone Call   Date discharged? 02/26/2020   How have you been since you were released from the hospital? "I have been doing fair"    Do you understand why you were in the hospital? yes   Do you understand the discharge instructions? yes   Where were you discharged to? Home    Items Reviewed:  Medications reviewed: yes  Allergies reviewed: yes  Dietary changes reviewed: N/A   Referrals reviewed: Yes, Cardiology    Functional Questionnaire:   Activities of Daily Living (ADLs):   He states they are independent in the following: ambulation, bathing and hygiene, feeding, continence, grooming, toileting and dressing States they require assistance with the following: N/A    Any transportation issues/concerns?: no   Any patient concerns? no   Confirmed importance and date/time of follow-up visits scheduled yes  Provider Appointment booked with Dorothyann Peng, NP 03/03/2020 at 2:30PM   Confirmed with patient if condition begins to worsen call PCP or go to the ER.  Patient was given the office number and encouraged to call back with question or concerns.  : yes

## 2020-02-28 NOTE — Telephone Encounter (Signed)
TCM call completed and patiet has f/u app

## 2020-03-03 ENCOUNTER — Other Ambulatory Visit: Payer: Self-pay

## 2020-03-03 ENCOUNTER — Encounter: Payer: Self-pay | Admitting: Adult Health

## 2020-03-03 ENCOUNTER — Ambulatory Visit: Payer: Medicare PPO | Admitting: Adult Health

## 2020-03-03 VITALS — BP 142/60 | HR 51 | Temp 98.0°F | Wt 179.0 lb

## 2020-03-03 DIAGNOSIS — N184 Chronic kidney disease, stage 4 (severe): Secondary | ICD-10-CM

## 2020-03-03 DIAGNOSIS — I10 Essential (primary) hypertension: Secondary | ICD-10-CM

## 2020-03-03 DIAGNOSIS — I5033 Acute on chronic diastolic (congestive) heart failure: Secondary | ICD-10-CM | POA: Diagnosis not present

## 2020-03-03 NOTE — Progress Notes (Signed)
Subjective:    Patient ID: Austin [name], male    DOB: 12-16-1934, 85 y.o.   MRN: [id number]  HPI 85 year old male who  has a past medical history of ANEMIA DUE TO CHRONIC BLOOD LOSS (03/13/2007), CAROTID ARTERY STENOSIS (05/10/2010), CHF (congestive heart failure) ([place]), DIABETES MELLITUS, TYPE II (09/19/2007), DISEASE, CEREBROVASCULAR NEC (03/05/2007), GERD (03/13/2007), HYPERLIPIDEMIA (03/05/2007), HYPERTENSION (03/05/2007), HYPOKALEMIA (11/09/2009), KNEE PAIN, RIGHT (11/09/2009), PROSTATE CANCER, HX OF (03/05/2007), and RENAL DISEASE, CHRONIC (02/03/2009).  TCM visit   Admit Date: 02/23/2020 Discharge Date 02/26/2020  Presented to the emergency room for worsening shortness of breath.  He reported that his diuretic was recently changed from Lasix to torsemide but the patient continued to have progressive shortness of breath and worsening pedal edema and unable to lie flat with associated dry cough.  His initial chest x-ray on admission showed chronic changes.  EKG showed sinus rhythm.  His BNP was elevated at 1700, creatinine around 3.  He was admitted for evaluation and management of acute on chronic diastolic heart failure and started on IV Lasix 40 mg twice daily.  Throughout the course of admission he was diuresed about 2.2 L in 24 hours and had a slight bump in his creatinine.  While in the hospital he was seen by cardiology and advised to continue with Demadex 20 mg twice daily and follow-up with them in a week.Marland Kitchen  His echo showed left ventricular EF of 50 to 55%.  The left ventricle has low normal function.  No regional wall motion abnormalities.  The cavity size was mildly dilated.  There was mild left ventricular hypertrophy.  Parameters were consistent with grade 2 diastolic dysfunction.  He was continued on clonidine 0.3 mg 3 times daily, Norvasc 10 mg daily, BiDil 20 mg/37.5 3 times daily and metoprolol 100 mg daily.  Today he reports since being discharged from the hospital he is feeling much  better. He is feeling back to baseline. He no longer has any SOB and the swelling in his legs have improved significantly.  He has his follow up cardiology tomorrow and Nephrology within the next two weeks.    He has no acute issues   Review of Systems  Constitutional: Negative.   Respiratory: Negative.   Cardiovascular: Positive for leg swelling.  Gastrointestinal: Negative.   Genitourinary: Negative.   Neurological: Negative.   Hematological: Negative.   Psychiatric/Behavioral: Negative.   All other systems reviewed and are negative.  Past Medical History:  Diagnosis Date  . 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 CAROTID ARTERY STENOSIS 05/10/2010  . CHF (congestive heart failure) ([place])   . DIABETES MELLITUS, TYPE II 09/19/2007  . DISEASE, CEREBROVASCULAR NEC 03/05/2007  . GERD 03/13/2007  . HYPERLIPIDEMIA 03/05/2007  . HYPERTENSION 03/05/2007  . HYPOKALEMIA 11/09/2009  . KNEE PAIN, RIGHT 11/09/2009  . PROSTATE CANCER, HX OF 03/05/2007  . RENAL DISEASE, CHRONIC 02/03/2009    Social History   Socioeconomic History  . Marital status: Married    Spouse name: Not on file  . Number of children: Not on file  . Years of education: Not on file  . Highest education level: Not on file  Occupational History  . Not on file  Tobacco Use  . Smoking status: Former Smoker    Types: Cigarettes    Quit date: 08/22/1974    Years since quitting: 45.5  . Smokeless tobacco: Never Used  Vaping Use  . Vaping Use: Never used  Substance and Sexual Activity  .  Alcohol use: No    Alcohol/week: 0.0 standard drinks  . Drug use: No  . Sexual activity: Not on file  Other Topics Concern  . Not on file  Social History Narrative   Retired - Environmental consultant    Married 18 years       He enjoys traveling    Investment banker, operational of Radio broadcast assistant Strain:   . Difficulty of Paying Living Expenses:   Food Insecurity:   . Worried About Charity fundraiser in the Last Year:   . Arts development officer in the Last Year:   Transportation Needs:   . Film/video editor (Medical):   Marland Kitchen Lack of Transportation (Non-Medical):   Physical Activity:   . Days of Exercise per Week:   . Minutes of Exercise per Session:   Stress:   . Feeling of Stress :   Social Connections:   . Frequency of Communication with Friends and Family:   . Frequency of Social Gatherings with Friends and Family:   . Attends Religious Services:   . Active Member of Clubs or Organizations:   . Attends Archivist Meetings:   Marland Kitchen Marital Status:   Intimate Partner Violence:   . Fear of Current or Ex-Partner:   . Emotionally Abused:   Marland Kitchen Physically Abused:   . Sexually Abused:     Past Surgical History:  Procedure Laterality Date  . CAROTID ARTERY ANGIOPLASTY Right Oct. 10, 2001  . ESOPHAGOGASTRODUODENOSCOPY (EGD) WITH PROPOFOL N/A 11/04/2016   Procedure: ESOPHAGOGASTRODUODENOSCOPY (EGD) WITH PROPOFOL;  Surgeon: Otis Brace, MD;  Location: [facility];  Service: Gastroenterology;  Laterality: N/A;  . LAPAROSCOPIC APPENDECTOMY  02/20/2012   Procedure: APPENDECTOMY LAPAROSCOPIC;  Surgeon: Stark Klein, MD;  Location: [facility] OR;  Service: General;  Laterality: N/A;  . PROSTATE SURGERY     prostatectomy    Family History  Problem Relation Age of Onset  . Hypertension Mother   . Cancer Father        Mesothelioma   . Stomach cancer Brother   . Cancer Brother   . Cancer - Cervical Brother   . Cancer Brother   . Diabetes Brother   . Esophageal cancer Neg Hx   . Colon cancer Neg Hx   . Pancreatic cancer Neg Hx     Allergies  Allergen Reactions  . Aspirin Other (See Comments)    High doses causes stomach ulcer and bleeding    Current Outpatient Medications on File Prior to Visit  Medication Sig Dispense Refill  . acetaminophen (TYLENOL) 325 MG tablet Take 650 mg by mouth every 6 (six) hours as needed for mild pain or moderate pain.    Marland Kitchen amLODipine (NORVASC) 10 MG tablet TAKE 1 TABLET BY  MOUTH EVERY DAY (Patient taking differently: Take 10 mg by mouth daily. ) 90 tablet 3  . atorvastatin (LIPITOR) 20 MG tablet TAKE 1 TABLET BY MOUTH EVERY DAY (Patient taking differently: Take 20 mg by mouth daily at 6 PM. ) 90 tablet 3  . Blood Glucose Monitoring Suppl (ACCU-CHEK AVIVA PLUS) w/Device KIT Used to check blood glucose 2 times a day or PRN (Patient taking differently: 1 each by Other route See admin instructions. Used to check blood glucose 2 times a day or PRN) 1 kit 0  . cloNIDine (CATAPRES) 0.3 MG tablet Take 1 tablet (0.3 mg total) by mouth 3 (three) times daily. 90 tablet 0  . clopidogrel (PLAVIX) 75 MG tablet TAKE 1 TABLET BY  MOUTH EVERY DAY (Patient taking differently: Take 75 mg by mouth daily. ) 90 tablet 3  . [employer] D3 25 MCG (1000 UT) capsule Take 1,000 Units by mouth daily.    Marland Kitchen doxazosin (CARDURA) 2 MG tablet Take 2 mg by mouth at bedtime.   3  . esomeprazole (NEXIUM) 40 MG capsule TAKE 1 CAPSULE BY MOUTH EVERY DAY (Patient taking differently: Take 40 mg by mouth daily. ) 90 capsule 3  . glipiZIDE (GLUCOTROL XL) 5 MG 24 hr tablet TAKE 1 TABLET BY MOUTH EVERY DAY (Patient taking differently: Take 5 mg by mouth daily. ) 90 tablet 1  . glucose blood (ACCU-CHEK AVIVA PLUS) test strip Used to check blood glucose BID or PRN (Patient taking differently: 1 each by Other route See admin instructions. Used to check blood glucose BID or PRN) 100 each 12  . hydrocortisone (ANUSOL-HC) 2.5 % rectal cream PLACE 1 APPLICATION RECTALLY 2 TIMES A DAY. (Patient taking differently: Apply 1 application topically 2 (two) times daily. ) 30 g 3  . isosorbide-hydrALAZINE (BIDIL) 20-37.5 MG tablet Take 1 tablet by mouth in the morning, at noon, and at bedtime.     Marland Kitchen KLOR-CON M10 10 MEQ tablet Take 10 mEq by mouth daily.    Marland Kitchen lactulose (CHRONULAC) 10 GM/15ML solution TAKE 15 ML BY MOUTH 2 TIMES DAILY AS NEEDED FOR MILD CONSTIPATION. (Patient taking differently: Take 20 g by mouth daily as needed for mild  constipation. ) 2838 mL 3  . Lancets (ACCU-CHEK MULTICLIX) lancets Used to check blood glucose BID or PRN (Patient taking differently: 1 each by Other route See admin instructions. Used to check blood glucose BID or PRN) 100 each 12  . metoprolol succinate (TOPROL-XL) 100 MG 24 hr tablet Take 100 mg by mouth daily.    Marland Kitchen POLY-IRON 150 150 MG capsule Take 150 mg by mouth daily.    Marland Kitchen Propylene Glycol (SYSTANE BALANCE OP) Place 1 drop into both eyes daily as needed.    . torsemide (DEMADEX) 20 MG tablet Take 1 tablet (20 mg total) by mouth 2 (two) times daily. 60 tablet 0  . triamcinolone cream (KENALOG) 0.1 % Apply 1 application topically 2 (two) times daily.     No current facility-administered medications on file prior to visit.    BP (!) 142/60   Pulse (!) 51   Temp 98 F (36.7 C)   Wt 179 lb (81.2 kg)   SpO2 99%   BMI 28.04 kg/m       Objective:   Physical Exam Vitals and nursing note reviewed.  Constitutional:      Appearance: Normal appearance.  Cardiovascular:     Rate and Rhythm: Normal rate and regular rhythm.     Pulses: Normal pulses.     Heart sounds: Normal heart sounds.  Pulmonary:     Effort: Pulmonary effort is normal.     Breath sounds: Normal breath sounds.  Musculoskeletal:     Right lower leg: 2+ Pitting Edema present.     Left lower leg: 1+ Pitting Edema present.  Skin:    General: Skin is warm and dry.  Neurological:     General: No focal deficit present.     Mental Status: He is alert and oriented to person, place, and time.  Psychiatric:        Mood and Affect: Mood normal.        Behavior: Behavior normal.        Thought Content: Thought content normal.  Judgment: Judgment normal.       Assessment & Plan:  1. CKD (chronic kidney disease) stage 4, GFR 15-29 ml/min (HCC) - No change in medication  - Follow up with Nephrology as directed - CBC with Differential/Platelet - Basic Metabolic Panel  2. Essential hypertension -No change in  medication at this time. - CBC with Differential/Platelet - Basic Metabolic Panel  3. Acute on chronic diastolic congestive heart failure [facility]) -Reviewed hospital notes, discharge instruction, imaging, and labs with the patient.  All questions answered to the best of my ability. -Continue with torsemide.  Follow-up with cardiology tomorrow - CBC with Differential/Platelet - Basic Metabolic Panel   Dorothyann Peng, NP

## 2020-03-03 NOTE — Patient Instructions (Signed)
I am glad you are feeling better.   We will get your blood work done today

## 2020-03-04 LAB — CBC WITH DIFFERENTIAL/PLATELET
Absolute Monocytes: 453 cells/uL (ref 200–950)
Basophils Absolute: 37 cells/uL (ref 0–200)
Basophils Relative: 0.6 %
Eosinophils Absolute: 112 cells/uL (ref 15–500)
Eosinophils Relative: 1.8 %
HCT: 30.8 % — ABNORMAL LOW (ref 38.5–50.0)
Hemoglobin: 9.6 g/dL — ABNORMAL LOW (ref 13.2–17.1)
Lymphs Abs: 707 cells/uL — ABNORMAL LOW (ref 850–3900)
MCH: 26.7 pg — ABNORMAL LOW (ref 27.0–33.0)
MCHC: 31.2 g/dL — ABNORMAL LOW (ref 32.0–36.0)
MCV: 85.8 fL (ref 80.0–100.0)
MPV: 12.5 fL (ref 7.5–12.5)
Monocytes Relative: 7.3 %
Neutro Abs: 4892 cells/uL (ref 1500–7800)
Neutrophils Relative %: 78.9 %
Platelets: 273 10*3/uL (ref 140–400)
RBC: 3.59 10*6/uL — ABNORMAL LOW (ref 4.20–5.80)
RDW: 13.6 % (ref 11.0–15.0)
Total Lymphocyte: 11.4 %
WBC: 6.2 10*3/uL (ref 3.8–10.8)

## 2020-03-04 LAB — BASIC METABOLIC PANEL
BUN/Creatinine Ratio: 11 (calc) (ref 6–22)
BUN: 41 mg/dL — ABNORMAL HIGH (ref 7–25)
CO2: 27 mmol/L (ref 20–32)
Calcium: 9 mg/dL (ref 8.6–10.3)
Chloride: 103 mmol/L (ref 98–110)
Creat: 3.59 mg/dL — ABNORMAL HIGH (ref 0.70–1.11)
Glucose, Bld: 231 mg/dL — ABNORMAL HIGH (ref 65–99)
Potassium: 3.9 mmol/L (ref 3.5–5.3)
Sodium: 138 mmol/L (ref 135–146)

## 2020-03-06 ENCOUNTER — Other Ambulatory Visit: Payer: Self-pay

## 2020-03-06 ENCOUNTER — Ambulatory Visit: Payer: Medicare PPO | Admitting: Podiatry

## 2020-03-06 ENCOUNTER — Encounter: Payer: Self-pay | Admitting: Podiatry

## 2020-03-06 DIAGNOSIS — M79674 Pain in right toe(s): Secondary | ICD-10-CM | POA: Diagnosis not present

## 2020-03-06 DIAGNOSIS — M79675 Pain in left toe(s): Secondary | ICD-10-CM

## 2020-03-06 DIAGNOSIS — L853 Xerosis cutis: Secondary | ICD-10-CM | POA: Diagnosis not present

## 2020-03-06 DIAGNOSIS — E0821 Diabetes mellitus due to underlying condition with diabetic nephropathy: Secondary | ICD-10-CM | POA: Diagnosis not present

## 2020-03-06 DIAGNOSIS — B351 Tinea unguium: Secondary | ICD-10-CM | POA: Diagnosis not present

## 2020-03-06 MED ORDER — GABAPENTIN 100 MG PO CAPS: 100.0000 mg | ORAL_CAPSULE | Freq: Three times a day (TID) | ORAL | 3 refills | Status: DC

## 2020-03-06 MED ORDER — AMMONIUM LACTATE 12 % EX LOTN
TOPICAL_LOTION | CUTANEOUS | Status: DC | PRN
Start: 2020-03-06 — End: 2024-03-07

## 2020-03-06 NOTE — Progress Notes (Signed)
  Subjective:  Patient ID: Austin [name], male    DOB: [birth year],  MRN: [id number]  Chief Complaint  Patient presents with  . Diabetes    A1C  6.2, 3 month follow up  . Nail Problem    thick painful toenails   85 y.o. male returns for the above complaint.  Patient presents with thickened elongated mycotic dystrophic toenails x10.  Pain there is pain associated with ambulation.  There is some curvature to the nail.  Patient is a diabetic with last A1c of 6.8.  Patient is currently taking Plavix.  Patient well-controlled diabetic.  He has secondary complaint of xerosis to bilateral lower extremity patient has tried various over-the-counter lotions which has not helped.  He would like to discuss prescription lotions.  He denies any other acute complaints.  .  Objective:   There were no vitals filed for this visit. Podiatric Exam: Vascular: dorsalis pedis and posterior tibial pulses are palpable bilateral. Capillary return is immediate. Temperature gradient is WNL. Skin turgor WNL  Sensorium: Normal Semmes Weinstein monofilament test. Normal tactile sensation bilaterally. Nail Exam: Pt has thick disfigured discolored nails with subungual debris noted bilateral entire nail hallux through fifth toenails Ulcer Exam: There is no evidence of ulcer or pre-ulcerative changes or infection. Orthopedic Exam: Muscle tone and strength are WNL. No limitations in general ROM. No crepitus or effusions noted. HAV  B/L.  Hammer toes 2-5  B/L. Skin: No Porokeratosis. No infection or ulcers  Assessment & Plan:  Patient was evaluated and treated and all questions answered.  -Xerosis -I explained to the patient the etiology of xerosis and various treatment options were extensively discussed.  I explained to the patient the importance of maintaining moisturization of the skin with application of over-the-counter lotion such as Eucerin or Luciderm.  At this point given that he has failed over-the-counter lotion he will  benefit from ammonium lactate.  I have asked him to apply twice a day.  Patient states understanding  Onychomycosis with pain  -Nails palliatively debrided as below. -Educated on self-care  Procedure: Nail Debridement Rationale: pain  Type of Debridement: manual, sharp debridement. Instrumentation: Nail nipper, rotary burr. Number of Nails: 10  Procedures and Treatment: Consent by patient was obtained for treatment procedures. The patient understood the discussion of treatment and procedures well. All questions were answered thoroughly reviewed. Debridement of mycotic and hypertrophic toenails, 1 through 5 bilateral and clearing of subungual debris. No ulceration, no infection noted.  Return Visit-Office Procedure: Patient instructed to return to the office for a follow up visit 3 months for continued evaluation and treatment.  Boneta Lucks, DPM    Return in about 3 months (around 06/06/2020) for diabetic nail trim.

## 2020-04-19 ENCOUNTER — Other Ambulatory Visit: Payer: Self-pay | Admitting: Adult Health

## 2020-05-01 ENCOUNTER — Encounter: Payer: Medicare PPO | Admitting: Adult Health

## 2020-05-15 ENCOUNTER — Encounter: Payer: Medicare PPO | Admitting: Adult Health

## 2020-05-20 ENCOUNTER — Other Ambulatory Visit: Payer: Self-pay | Admitting: Adult Health

## 2020-06-10 ENCOUNTER — Ambulatory Visit: Payer: Medicare PPO | Admitting: Podiatry

## 2020-06-10 ENCOUNTER — Other Ambulatory Visit: Payer: Self-pay

## 2020-06-10 DIAGNOSIS — E0821 Diabetes mellitus due to underlying condition with diabetic nephropathy: Secondary | ICD-10-CM | POA: Diagnosis not present

## 2020-06-10 DIAGNOSIS — B351 Tinea unguium: Secondary | ICD-10-CM | POA: Diagnosis not present

## 2020-06-10 DIAGNOSIS — M79674 Pain in right toe(s): Secondary | ICD-10-CM

## 2020-06-10 DIAGNOSIS — M79675 Pain in left toe(s): Secondary | ICD-10-CM

## 2020-06-10 DIAGNOSIS — M792 Neuralgia and neuritis, unspecified: Secondary | ICD-10-CM

## 2020-06-11 ENCOUNTER — Encounter: Payer: Self-pay | Admitting: Podiatry

## 2020-06-11 NOTE — Progress Notes (Signed)
  Subjective:  Patient ID: Austin [name], male    DOB: Jun 26, 1935,  MRN: [id number]  Chief Complaint  Patient presents with  . routine foot care    nail trim    85 y.o. male returns for the above complaint.  Patient presents with thickened elongated mycotic dystrophic toenails x10.  Pain there is pain associated with ambulation.  There is some curvature to the nail.  Patient is a diabetic with last A1c of 6.8.  Patient is currently taking Plavix.  Patient well-controlled diabetic.  He has secondary complaint of neuropathic pain secondary to his diabetes.  He states his sugars have been limited out of control.  He states his pain is getting worse.  He has not been taking anything for it.  He just wants to discuss other treatment options that are available.  He denies any other acute complaints  Objective:   There were no vitals filed for this visit. Podiatric Exam: Vascular: dorsalis pedis and posterior tibial pulses are palpable bilateral. Capillary return is immediate. Temperature gradient is WNL. Skin turgor WNL  Sensorium: Slightly decreased Semmes Weinstein monofilament test.  Slightly decreased tactile sensation bilaterally. Nail Exam: Pt has thick disfigured discolored nails with subungual debris noted bilateral entire nail hallux through fifth toenails Ulcer Exam: There is no evidence of ulcer or pre-ulcerative changes or infection. Orthopedic Exam: Muscle tone and strength are WNL. No limitations in general ROM. No crepitus or effusions noted. HAV  B/L.  Hammer toes 2-5  B/L. Skin: No Porokeratosis. No infection or ulcers  Assessment & Plan:  Patient was evaluated and treated and all questions answered.  -Xerosis -I explained to the patient the etiology of xerosis and various treatment options were extensively discussed.  I explained to the patient the importance of maintaining moisturization of the skin with application of over-the-counter lotion such as Eucerin or Luciderm.  At this  point given that he has failed over-the-counter lotion he will benefit from ammonium lactate.  I have asked him to apply twice a day.  Patient states understanding  Neuropathic pain -I explained the patient the etiology of neuropathic pain and various treatment options were discussed.  Ultimately I discussed with the patient the importance of glucose management which will ultimately help with the pain.  For now patient does not want to take any medications and ultimately will discuss diet control to help with the pain.  Patient states understanding.  Onychomycosis with pain  -Nails palliatively debrided as below. -Educated on self-care  Procedure: Nail Debridement Rationale: pain  Type of Debridement: manual, sharp debridement. Instrumentation: Nail nipper, rotary burr. Number of Nails: 10  Procedures and Treatment: Consent by patient was obtained for treatment procedures. The patient understood the discussion of treatment and procedures well. All questions were answered thoroughly reviewed. Debridement of mycotic and hypertrophic toenails, 1 through 5 bilateral and clearing of subungual debris. No ulceration, no infection noted.  Return Visit-Office Procedure: Patient instructed to return to the office for a follow up visit 3 months for continued evaluation and treatment.  Boneta Lucks, DPM    No follow-ups on file.

## 2020-06-15 ENCOUNTER — Other Ambulatory Visit: Payer: Self-pay | Admitting: Adult Health

## 2020-06-15 DIAGNOSIS — K5900 Constipation, unspecified: Secondary | ICD-10-CM

## 2020-06-24 ENCOUNTER — Other Ambulatory Visit: Payer: Self-pay | Admitting: Adult Health

## 2020-06-24 MED ORDER — GLIPIZIDE ER 5 MG PO TB24: 5.0000 mg | ORAL_TABLET | Freq: Every day | ORAL | 0 refills | Status: DC

## 2020-06-28 ENCOUNTER — Other Ambulatory Visit: Payer: Self-pay | Admitting: Adult Health

## 2020-06-29 ENCOUNTER — Other Ambulatory Visit: Payer: Self-pay | Admitting: Adult Health

## 2020-08-26 ENCOUNTER — Other Ambulatory Visit: Payer: Self-pay | Admitting: Adult Health

## 2020-08-28 ENCOUNTER — Telehealth: Payer: Self-pay | Admitting: Adult Health

## 2020-08-28 MED ORDER — HYDRALAZINE HCL 50 MG PO TABS: 50.0000 mg | ORAL_TABLET | Freq: Three times a day (TID) | ORAL | 3 refills | Status: DC

## 2020-08-28 MED ORDER — ISOSORBIDE DINITRATE 20 MG PO TABS: 20.0000 mg | ORAL_TABLET | Freq: Three times a day (TID) | ORAL | 3 refills | Status: DC

## 2020-08-28 NOTE — Telephone Encounter (Signed)
Sent to the pharmacy by e-scribe. 

## 2020-08-28 NOTE — Telephone Encounter (Signed)
Received a note from the insurance company that they will no longer cover BiDil 20-37.5 mg tablets.  Will send  in isosorbide and hydralazine

## 2020-09-11 ENCOUNTER — Ambulatory Visit: Payer: Medicare PPO | Admitting: Podiatry

## 2020-09-16 ENCOUNTER — Emergency Department (HOSPITAL_COMMUNITY): Payer: Medicare PPO

## 2020-09-16 ENCOUNTER — Other Ambulatory Visit: Payer: Self-pay

## 2020-09-16 ENCOUNTER — Inpatient Hospital Stay (HOSPITAL_COMMUNITY)
Admission: EM | Admit: 2020-09-16 | Discharge: 2020-09-20 | DRG: 291 | Disposition: A | Discharge status: Home / Self Care | Payer: Medicare PPO | Attending: Internal Medicine | Admitting: Internal Medicine

## 2020-09-16 ENCOUNTER — Encounter (HOSPITAL_COMMUNITY): Payer: Self-pay | Admitting: Emergency Medicine

## 2020-09-16 DIAGNOSIS — Z8546 Personal history of malignant neoplasm of prostate: Secondary | ICD-10-CM | POA: Diagnosis not present

## 2020-09-16 DIAGNOSIS — I5031 Acute diastolic (congestive) heart failure: Secondary | ICD-10-CM | POA: Diagnosis not present

## 2020-09-16 DIAGNOSIS — E1129 Type 2 diabetes mellitus with other diabetic kidney complication: Secondary | ICD-10-CM | POA: Diagnosis present

## 2020-09-16 DIAGNOSIS — L853 Xerosis cutis: Secondary | ICD-10-CM

## 2020-09-16 DIAGNOSIS — E785 Hyperlipidemia, unspecified: Secondary | ICD-10-CM | POA: Diagnosis present

## 2020-09-16 DIAGNOSIS — T502X5A Adverse effect of carbonic-anhydrase inhibitors, benzothiadiazides and other diuretics, initial encounter: Secondary | ICD-10-CM | POA: Diagnosis present

## 2020-09-16 DIAGNOSIS — D631 Anemia in chronic kidney disease: Secondary | ICD-10-CM | POA: Diagnosis present

## 2020-09-16 DIAGNOSIS — J9601 Acute respiratory failure with hypoxia: Secondary | ICD-10-CM | POA: Diagnosis not present

## 2020-09-16 DIAGNOSIS — I130 Hypertensive heart and chronic kidney disease with heart failure and stage 1 through stage 4 chronic kidney disease, or unspecified chronic kidney disease: Principal | ICD-10-CM | POA: Diagnosis present

## 2020-09-16 DIAGNOSIS — I5043 Acute on chronic combined systolic (congestive) and diastolic (congestive) heart failure: Secondary | ICD-10-CM | POA: Diagnosis present

## 2020-09-16 DIAGNOSIS — Z87891 Personal history of nicotine dependence: Secondary | ICD-10-CM

## 2020-09-16 DIAGNOSIS — N289 Disorder of kidney and ureter, unspecified: Secondary | ICD-10-CM | POA: Diagnosis not present

## 2020-09-16 DIAGNOSIS — Z79899 Other long term (current) drug therapy: Secondary | ICD-10-CM

## 2020-09-16 DIAGNOSIS — I509 Heart failure, unspecified: Secondary | ICD-10-CM | POA: Diagnosis not present

## 2020-09-16 DIAGNOSIS — Z7984 Long term (current) use of oral hypoglycemic drugs: Secondary | ICD-10-CM

## 2020-09-16 DIAGNOSIS — Z8249 Family history of ischemic heart disease and other diseases of the circulatory system: Secondary | ICD-10-CM

## 2020-09-16 DIAGNOSIS — Z886 Allergy status to analgesic agent status: Secondary | ICD-10-CM

## 2020-09-16 DIAGNOSIS — Z9049 Acquired absence of other specified parts of digestive tract: Secondary | ICD-10-CM

## 2020-09-16 DIAGNOSIS — R509 Fever, unspecified: Secondary | ICD-10-CM | POA: Diagnosis present

## 2020-09-16 DIAGNOSIS — Z7902 Long term (current) use of antithrombotics/antiplatelets: Secondary | ICD-10-CM

## 2020-09-16 DIAGNOSIS — R911 Solitary pulmonary nodule: Secondary | ICD-10-CM | POA: Diagnosis present

## 2020-09-16 DIAGNOSIS — N184 Chronic kidney disease, stage 4 (severe): Secondary | ICD-10-CM | POA: Diagnosis present

## 2020-09-16 DIAGNOSIS — E876 Hypokalemia: Secondary | ICD-10-CM | POA: Diagnosis present

## 2020-09-16 DIAGNOSIS — K5900 Constipation, unspecified: Secondary | ICD-10-CM | POA: Diagnosis present

## 2020-09-16 DIAGNOSIS — I248 Other forms of acute ischemic heart disease: Secondary | ICD-10-CM | POA: Diagnosis present

## 2020-09-16 DIAGNOSIS — R7989 Other specified abnormal findings of blood chemistry: Secondary | ICD-10-CM | POA: Diagnosis not present

## 2020-09-16 DIAGNOSIS — Z9119 Patient's noncompliance with other medical treatment and regimen: Secondary | ICD-10-CM | POA: Diagnosis not present

## 2020-09-16 DIAGNOSIS — D509 Iron deficiency anemia, unspecified: Secondary | ICD-10-CM | POA: Diagnosis present

## 2020-09-16 DIAGNOSIS — E1122 Type 2 diabetes mellitus with diabetic chronic kidney disease: Secondary | ICD-10-CM | POA: Diagnosis present

## 2020-09-16 DIAGNOSIS — T501X5A Adverse effect of loop [high-ceiling] diuretics, initial encounter: Secondary | ICD-10-CM | POA: Diagnosis present

## 2020-09-16 DIAGNOSIS — N185 Chronic kidney disease, stage 5: Secondary | ICD-10-CM | POA: Diagnosis present

## 2020-09-16 DIAGNOSIS — K219 Gastro-esophageal reflux disease without esophagitis: Secondary | ICD-10-CM | POA: Diagnosis present

## 2020-09-16 DIAGNOSIS — Z833 Family history of diabetes mellitus: Secondary | ICD-10-CM

## 2020-09-16 DIAGNOSIS — Z9111 Patient's noncompliance with dietary regimen: Secondary | ICD-10-CM

## 2020-09-16 DIAGNOSIS — I160 Hypertensive urgency: Secondary | ICD-10-CM | POA: Diagnosis present

## 2020-09-16 DIAGNOSIS — E1169 Type 2 diabetes mellitus with other specified complication: Secondary | ICD-10-CM | POA: Diagnosis present

## 2020-09-16 DIAGNOSIS — Z20822 Contact with and (suspected) exposure to covid-19: Secondary | ICD-10-CM | POA: Diagnosis present

## 2020-09-16 DIAGNOSIS — R944 Abnormal results of kidney function studies: Secondary | ICD-10-CM | POA: Diagnosis present

## 2020-09-16 DIAGNOSIS — I679 Cerebrovascular disease, unspecified: Secondary | ICD-10-CM | POA: Diagnosis present

## 2020-09-16 DIAGNOSIS — N189 Chronic kidney disease, unspecified: Secondary | ICD-10-CM | POA: Diagnosis present

## 2020-09-16 DIAGNOSIS — R918 Other nonspecific abnormal finding of lung field: Secondary | ICD-10-CM

## 2020-09-16 DIAGNOSIS — N179 Acute kidney failure, unspecified: Secondary | ICD-10-CM | POA: Diagnosis present

## 2020-09-16 LAB — BASIC METABOLIC PANEL WITH GFR
Anion gap: 12 (ref 5–15)
BUN: 35 mg/dL — ABNORMAL HIGH (ref 8–23)
CO2: 23 mmol/L (ref 22–32)
Calcium: 8.7 mg/dL — ABNORMAL LOW (ref 8.9–10.3)
Chloride: 107 mmol/L (ref 98–111)
Creatinine, Ser: 3.51 mg/dL — ABNORMAL HIGH (ref 0.61–1.24)
GFR, Estimated: 16 mL/min — ABNORMAL LOW (ref >60)
Glucose, Bld: 135 mg/dL — ABNORMAL HIGH (ref 70–99)
Potassium: 3.1 mmol/L — ABNORMAL LOW (ref 3.5–5.1)
Sodium: 142 mmol/L (ref 135–145)

## 2020-09-16 LAB — HEMOGLOBIN A1C
Hgb A1c MFr Bld: 6.8 % — ABNORMAL HIGH (ref 4.8–5.6)
Mean Plasma Glucose: 148.46 mg/dL

## 2020-09-16 LAB — CBC
HCT: 32 % — ABNORMAL LOW (ref 39.0–52.0)
Hemoglobin: 10 g/dL — ABNORMAL LOW (ref 13.0–17.0)
MCH: 26.5 pg (ref 26.0–34.0)
MCHC: 31.3 g/dL (ref 30.0–36.0)
MCV: 84.7 fL (ref 80.0–100.0)
Platelets: 262 10*3/uL (ref 150–400)
RBC: 3.78 MIL/uL — ABNORMAL LOW (ref 4.22–5.81)
RDW: 14.6 % (ref 11.5–15.5)
WBC: 5.7 10*3/uL (ref 4.0–10.5)
nRBC: 0 % (ref 0.0–0.2)

## 2020-09-16 LAB — GLUCOSE, CAPILLARY
Glucose-Capillary: 124 mg/dL — ABNORMAL HIGH (ref 70–99)
Glucose-Capillary: 173 mg/dL — ABNORMAL HIGH (ref 70–99)

## 2020-09-16 LAB — BRAIN NATRIURETIC PEPTIDE: B Natriuretic Peptide: 1223.4 pg/mL — ABNORMAL HIGH (ref 0.0–100.0)

## 2020-09-16 LAB — HEPATIC FUNCTION PANEL
ALT: 9 U/L (ref 0–44)
AST: 14 U/L — ABNORMAL LOW (ref 15–41)
Albumin: 3.4 g/dL — ABNORMAL LOW (ref 3.5–5.0)
Alkaline Phosphatase: 71 U/L (ref 38–126)
Bilirubin, Direct: <0.1 mg/dL (ref 0.0–0.2)
Total Bilirubin: 0.6 mg/dL (ref 0.3–1.2)
Total Protein: 7 g/dL (ref 6.5–8.1)

## 2020-09-16 LAB — LIPASE, BLOOD: Lipase: 27 U/L (ref 11–51)

## 2020-09-16 LAB — SARS CORONAVIRUS 2 BY RT PCR (HOSPITAL ORDER, PERFORMED IN ~~LOC~~ HOSPITAL LAB): SARS Coronavirus 2: NEGATIVE

## 2020-09-16 LAB — TROPONIN I (HIGH SENSITIVITY)
Troponin I (High Sensitivity): 34 ng/L — ABNORMAL HIGH (ref <18)
Troponin I (High Sensitivity): 30 ng/L — ABNORMAL HIGH (ref <18)

## 2020-09-16 LAB — MAGNESIUM: Magnesium: 1.9 mg/dL (ref 1.7–2.4)

## 2020-09-16 MED ORDER — SODIUM CHLORIDE 0.9% FLUSH
3.0000 mL | Freq: Two times a day (BID) | INTRAVENOUS | Status: DC
  Administered 2020-09-16 – 2020-09-20 (×9): 3 mL via INTRAVENOUS

## 2020-09-16 MED ORDER — HEPARIN SODIUM (PORCINE) 5000 UNIT/ML IJ SOLN
5000.0000 [IU] | Freq: Two times a day (BID) | INTRAMUSCULAR | Status: DC
  Administered 2020-09-16 – 2020-09-20 (×9): 5000 [IU] via SUBCUTANEOUS
  Filled 2020-09-16 (×9): qty 1

## 2020-09-16 MED ORDER — ISOSORBIDE DINITRATE 10 MG PO TABS
20.0000 mg | ORAL_TABLET | Freq: Three times a day (TID) | ORAL | Status: DC
  Administered 2020-09-16 – 2020-09-20 (×12): 20 mg via ORAL
  Filled 2020-09-16 (×8): qty 2
  Filled 2020-09-16: qty 1
  Filled 2020-09-16 (×4): qty 2
  Filled 2020-09-16: qty 1

## 2020-09-16 MED ORDER — LOSARTAN POTASSIUM 25 MG PO TABS
12.5000 mg | ORAL_TABLET | Freq: Every day | ORAL | Status: DC
  Administered 2020-09-16 – 2020-09-19 (×4): 12.5 mg via ORAL
  Filled 2020-09-16: qty 0.5
  Filled 2020-09-16: qty 1
  Filled 2020-09-16: qty 0.5
  Filled 2020-09-16 (×3): qty 1

## 2020-09-16 MED ORDER — ATORVASTATIN CALCIUM 10 MG PO TABS
20.0000 mg | ORAL_TABLET | Freq: Every day | ORAL | Status: DC
  Administered 2020-09-16 – 2020-09-20 (×5): 20 mg via ORAL
  Filled 2020-09-16 (×5): qty 2

## 2020-09-16 MED ORDER — POLYSACCHARIDE IRON COMPLEX 150 MG PO CAPS
150.0000 mg | ORAL_CAPSULE | Freq: Every day | ORAL | Status: DC
  Administered 2020-09-17 – 2020-09-19 (×3): 150 mg via ORAL
  Filled 2020-09-16 (×4): qty 1

## 2020-09-16 MED ORDER — CLOPIDOGREL BISULFATE 75 MG PO TABS
75.0000 mg | ORAL_TABLET | Freq: Every day | ORAL | Status: DC
  Administered 2020-09-16 – 2020-09-20 (×5): 75 mg via ORAL
  Filled 2020-09-16 (×5): qty 1

## 2020-09-16 MED ORDER — LACTULOSE 10 GM/15ML PO SOLN
10.0000 g | Freq: Two times a day (BID) | ORAL | Status: DC | PRN
  Administered 2020-09-18 – 2020-09-20 (×3): 10 g via ORAL
  Filled 2020-09-16 (×5): qty 15

## 2020-09-16 MED ORDER — HYDRALAZINE HCL 50 MG PO TABS
50.0000 mg | ORAL_TABLET | Freq: Three times a day (TID) | ORAL | Status: DC
  Administered 2020-09-16 – 2020-09-17 (×2): 50 mg via ORAL
  Filled 2020-09-16 (×3): qty 1

## 2020-09-16 MED ORDER — METOPROLOL TARTRATE 25 MG PO TABS: 50.0000 mg | ORAL_TABLET | Freq: Once | ORAL | Status: DC

## 2020-09-16 MED ORDER — AMLODIPINE BESYLATE 5 MG PO TABS
10.0000 mg | ORAL_TABLET | Freq: Once | ORAL | Status: AC
  Administered 2020-09-16: 10 mg via ORAL
  Filled 2020-09-16: qty 2

## 2020-09-16 MED ORDER — TRIAMCINOLONE ACETONIDE 0.1 % EX CREA
1.0000 "application " | TOPICAL_CREAM | Freq: Two times a day (BID) | CUTANEOUS | Status: DC
  Administered 2020-09-17 – 2020-09-19 (×4): 1 via TOPICAL
  Filled 2020-09-16: qty 15

## 2020-09-16 MED ORDER — HYDRALAZINE HCL 50 MG PO TABS
50.0000 mg | ORAL_TABLET | Freq: Once | ORAL | Status: AC
  Administered 2020-09-16: 50 mg via ORAL
  Filled 2020-09-16: qty 1

## 2020-09-16 MED ORDER — ACETAMINOPHEN 325 MG PO TABS
650.0000 mg | ORAL_TABLET | ORAL | Status: DC | PRN
  Administered 2020-09-17: 650 mg via ORAL
  Filled 2020-09-16: qty 2

## 2020-09-16 MED ORDER — PANTOPRAZOLE SODIUM 40 MG PO TBEC
40.0000 mg | DELAYED_RELEASE_TABLET | Freq: Every day | ORAL | Status: DC
  Administered 2020-09-16 – 2020-09-20 (×5): 40 mg via ORAL
  Filled 2020-09-16 (×5): qty 1

## 2020-09-16 MED ORDER — AMMONIUM LACTATE 12 % EX LOTN
1.0000 "application " | TOPICAL_LOTION | CUTANEOUS | Status: DC | PRN
  Administered 2020-09-16 – 2020-09-17 (×2): 1 via TOPICAL
  Filled 2020-09-16: qty 225

## 2020-09-16 MED ORDER — ONDANSETRON HCL 4 MG/2ML IJ SOLN: 4.0000 mg | Freq: Four times a day (QID) | INTRAMUSCULAR | Status: DC | PRN

## 2020-09-16 MED ORDER — GABAPENTIN 100 MG PO CAPS: 100.0000 mg | ORAL_CAPSULE | Freq: Three times a day (TID) | ORAL | Status: DC

## 2020-09-16 MED ORDER — INSULIN ASPART 100 UNIT/ML ~~LOC~~ SOLN
0.0000 [IU] | Freq: Three times a day (TID) | SUBCUTANEOUS | Status: DC
  Administered 2020-09-16 – 2020-09-17 (×4): 1 [IU] via SUBCUTANEOUS
  Administered 2020-09-18: 2 [IU] via SUBCUTANEOUS
  Administered 2020-09-18: 1 [IU] via SUBCUTANEOUS
  Administered 2020-09-19: 2 [IU] via SUBCUTANEOUS
  Administered 2020-09-19 – 2020-09-20 (×3): 1 [IU] via SUBCUTANEOUS
  Administered 2020-09-20: 2 [IU] via SUBCUTANEOUS

## 2020-09-16 MED ORDER — SODIUM CHLORIDE 0.9% FLUSH: 3.0000 mL | INTRAVENOUS | Status: DC | PRN

## 2020-09-16 MED ORDER — POTASSIUM CHLORIDE CRYS ER 10 MEQ PO TBCR
10.0000 meq | EXTENDED_RELEASE_TABLET | Freq: Every day | ORAL | Status: DC
  Administered 2020-09-16 – 2020-09-18 (×3): 10 meq via ORAL
  Filled 2020-09-16 (×3): qty 1

## 2020-09-16 MED ORDER — HYDRALAZINE HCL 20 MG/ML IJ SOLN
10.0000 mg | Freq: Four times a day (QID) | INTRAMUSCULAR | Status: DC | PRN
  Administered 2020-09-16 – 2020-09-17 (×2): 10 mg via INTRAVENOUS
  Filled 2020-09-16 (×3): qty 1

## 2020-09-16 MED ORDER — ACETAMINOPHEN 325 MG PO TABS: 650.0000 mg | ORAL_TABLET | Freq: Four times a day (QID) | ORAL | Status: DC | PRN

## 2020-09-16 MED ORDER — CLONIDINE HCL 0.1 MG PO TABS
0.3000 mg | ORAL_TABLET | Freq: Three times a day (TID) | ORAL | Status: DC
  Administered 2020-09-16 – 2020-09-20 (×12): 0.3 mg via ORAL
  Filled 2020-09-16 (×12): qty 3

## 2020-09-16 MED ORDER — POTASSIUM CHLORIDE 10 MEQ/100ML IV SOLN
10.0000 meq | Freq: Once | INTRAVENOUS | Status: AC
  Administered 2020-09-16: 10 meq via INTRAVENOUS
  Filled 2020-09-16: qty 100

## 2020-09-16 MED ORDER — SODIUM CHLORIDE 0.9 % IV SOLN: 250.0000 mL | INTRAVENOUS | Status: DC | PRN

## 2020-09-16 MED ORDER — FUROSEMIDE 10 MG/ML IJ SOLN
40.0000 mg | Freq: Once | INTRAMUSCULAR | Status: AC
  Administered 2020-09-16: 40 mg via INTRAVENOUS
  Filled 2020-09-16: qty 4

## 2020-09-16 MED ORDER — AMLODIPINE BESYLATE 10 MG PO TABS
10.0000 mg | ORAL_TABLET | Freq: Every day | ORAL | Status: DC
  Administered 2020-09-17 – 2020-09-20 (×4): 10 mg via ORAL
  Filled 2020-09-16 (×3): qty 1
  Filled 2020-09-16: qty 2
  Filled 2020-09-16: qty 1

## 2020-09-16 MED ORDER — DOXAZOSIN MESYLATE 2 MG PO TABS
2.0000 mg | ORAL_TABLET | Freq: Every day | ORAL | Status: DC
  Administered 2020-09-16 – 2020-09-19 (×4): 2 mg via ORAL
  Filled 2020-09-16 (×6): qty 1

## 2020-09-16 MED ORDER — ALBUTEROL SULFATE (2.5 MG/3ML) 0.083% IN NEBU
2.5000 mg | INHALATION_SOLUTION | RESPIRATORY_TRACT | Status: DC | PRN
  Administered 2020-09-17 (×2): 2.5 mg via RESPIRATORY_TRACT
  Filled 2020-09-16 (×3): qty 3

## 2020-09-16 MED ORDER — FUROSEMIDE 10 MG/ML IJ SOLN
60.0000 mg | Freq: Once | INTRAMUSCULAR | Status: AC
  Administered 2020-09-16: 60 mg via INTRAVENOUS
  Filled 2020-09-16: qty 6

## 2020-09-16 MED ORDER — FUROSEMIDE 10 MG/ML IJ SOLN
40.0000 mg | Freq: Once | INTRAMUSCULAR | Status: DC
  Filled 2020-09-16: qty 4

## 2020-09-16 MED ORDER — METOPROLOL SUCCINATE ER 50 MG PO TB24
50.0000 mg | ORAL_TABLET | Freq: Every day | ORAL | Status: DC
  Administered 2020-09-16 – 2020-09-17 (×2): 50 mg via ORAL
  Filled 2020-09-16 (×4): qty 1

## 2020-09-16 NOTE — Consult Note (Signed)
Reason for Consult:Decompensated congestive heart failure Referring Physician:[facility]  Austin [name] is an 86 y.o. male.  Austin [name] is 86 year old male with past medical history is significant forhypertensive heart disease with grade 2 diastolic dysfunction, history of congestive heart failure secondary to preserved LV systolic function, hypertension, diabetes mellitus, hyperlipidemia, cerebrovascular disease, status post PTA to internal carotid artery in the past, chronic kidney disease stage IV, anemia of chronic disease, history of carcinoma of the prostate, and GERD, valvular heart disease, came to the ER complaining of progressively increasing shortness of breath associated with abdominal distention and leg swelling for last for 5 days.  States has been drinking extra fluids and salty food for last few days since his birthday last night developed sudden onset of worsening shortness of breath, so decided to come to ED.  Patient denies any anginal chest pain.  Denies nausea, vomiting, diaphoresis.  Denies any cough, fever or chills.  Denies noncompliance to medications.  Patient noted to have markedly elevated blood pressure in the ED, although has not taken any medications for today.  Denies palpitation, lightheadedness or syncope.EKG done in the knee showed normal sinus rhythm with LVH and strain pattern.  No new acute ischemic changes were noted.  High sensitivity troponin I was minimally elevated, which is attributed to heart failure and chronic kidney disease, which is also trending down.  BNP was 1223.  Past Medical History:  Diagnosis Date  . 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 CAROTID ARTERY STENOSIS 05/10/2010  . CHF (congestive heart failure) ([place])   . DIABETES MELLITUS, TYPE II 09/19/2007  . DISEASE, CEREBROVASCULAR NEC 03/05/2007  . GERD 03/13/2007  . HYPERLIPIDEMIA 03/05/2007  . HYPERTENSION 03/05/2007  . HYPOKALEMIA 11/09/2009  . KNEE PAIN, RIGHT 11/09/2009  . PROSTATE  CANCER, HX OF 03/05/2007  . RENAL DISEASE, CHRONIC 02/03/2009    Past Surgical History:  Procedure Laterality Date  . CAROTID ARTERY ANGIOPLASTY Right Oct. 10, 2001  . ESOPHAGOGASTRODUODENOSCOPY (EGD) WITH PROPOFOL N/A 11/04/2016   Procedure: ESOPHAGOGASTRODUODENOSCOPY (EGD) WITH PROPOFOL;  Surgeon: Otis Brace, MD;  Location: [facility];  Service: Gastroenterology;  Laterality: N/A;  . LAPAROSCOPIC APPENDECTOMY  02/20/2012   Procedure: APPENDECTOMY LAPAROSCOPIC;  Surgeon: Stark Klein, MD;  Location[facility];  Service: General;  Laterality: N/A;  . PROSTATE SURGERY     prostatectomy    Family History  Problem Relation Age of Onset  . Hypertension Mother   . Cancer Father        Mesothelioma   . Stomach cancer Brother   . Cancer Brother   . Cancer - Cervical Brother   . Cancer Brother   . Diabetes Brother   . Esophageal cancer Neg Hx   . Colon cancer Neg Hx   . Pancreatic cancer Neg Hx     Social History:  reports that he quit smoking about 46 years ago. His smoking use included cigarettes. He has never used smokeless tobacco. He reports that he does not drink alcohol and does not use drugs.  Allergies:  Allergies  Allergen Reactions  . Aspirin Other (See Comments)    High doses causes stomach ulcer and bleeding    Medications: I have reviewed the patient's current medications.  Results for orders placed or performed during the hospital encounter of 09/16/20 (from the past 48 hour(s))  SARS Coronavirus 2 by RT PCR (hospital order, performed in [facility] hospital lab) Nasopharyngeal Nasopharyngeal Swab     Status: None   Collection Time: 09/16/20  7:57  AM   Specimen: Nasopharyngeal Swab  Result Value Ref Range   SARS Coronavirus 2 NEGATIVE NEGATIVE    Comment: (NOTE) SARS-CoV-2 target nucleic acids are NOT DETECTED.  The SARS-CoV-2 RNA is generally detectable in upper and lower respiratory specimens during the acute phase of infection. The lowest concentration of  SARS-CoV-2 viral copies this assay can detect is 250 copies / mL. A negative result does not preclude SARS-CoV-2 infection and should not be used as the sole basis for treatment or other patient management decisions.  A negative result may occur with improper specimen collection / handling, submission of specimen other than nasopharyngeal swab, presence of viral mutation(s) within the areas targeted by this assay, and inadequate number of viral copies (<250 copies / mL). A negative result must be combined with clinical observations, patient history, and epidemiological information.  Fact Sheet for Patients:   StrictlyIdeas.no  Fact Sheet for Healthcare Providers: BankingDealers.co.za  This test is not yet approved or  cleared by the [place] FDA and has been authorized for detection and/or diagnosis of SARS-CoV-2 by FDA under an Emergency Use Authorization (EUA).  This EUA will remain in effect (meaning this test can be used) for the duration of the COVID-19 declaration under Section 564(b)(1) of the Act, 21 U.S.C. section 360bbb-3(b)(1), unless the authorization is terminated or revoked sooner.  Performed at [facility], [place] [address] [place], [place] [address]   Basic metabolic panel     Status: Abnormal   Collection Time: 09/16/20  7:59 AM  Result Value Ref Range   Sodium 142 135 - 145 mmol/L   Potassium 3.1 (L) 3.5 - 5.1 mmol/L   Chloride 107 98 - 111 mmol/L   CO2 23 22 - 32 mmol/L   Glucose, Bld 135 (H) 70 - 99 mg/dL    Comment: Glucose reference range applies only to samples taken after fasting for at least 8 hours.   BUN 35 (H) 8 - 23 mg/dL   Creatinine, Ser 3.51 (H) 0.61 - 1.24 mg/dL   Calcium 8.7 (L) 8.9 - 10.3 mg/dL   GFR, Estimated 16 (L) >60 mL/min    Comment: (NOTE) Calculated using the CKD-EPI Creatinine Equation (2021)    Anion gap 12 5 - 15    Comment: Performed at [facility]  [address] [place], [place] [address]  CBC     Status: Abnormal   Collection Time: 09/16/20  7:59 AM  Result Value Ref Range   WBC 5.7 4.0 - 10.5 K/uL   RBC 3.78 (L) 4.22 - 5.81 MIL/uL   Hemoglobin 10.0 (L) 13.0 - 17.0 g/dL   HCT 32.0 (L) 39.0 - 52.0 %   MCV 84.7 80.0 - 100.0 fL   MCH 26.5 26.0 - 34.0 pg   MCHC 31.3 30.0 - 36.0 g/dL   RDW 14.6 11.5 - 15.5 %   Platelets 262 150 - 400 K/uL   nRBC 0.0 0.0 - 0.2 %    Comment: Performed at [facility], [place] [address], [place], [place] [address]  Troponin I (High Sensitivity)     Status: Abnormal   Collection Time: 09/16/20  7:59 AM  Result Value Ref Range   Troponin I (High Sensitivity) 34 (H) <18 ng/L    Comment: (NOTE) Elevated high sensitivity troponin I (hsTnI) values and significant  changes across serial measurements may suggest ACS but many other  chronic and acute conditions are known to elevate hsTnI results.  Refer to the "  Links" section for chest pain algorithms and additional  guidance. Performed at [facility], [place] [address] [place], [place] [address]   Brain natriuretic peptide     Status: Abnormal   Collection Time: 09/16/20  9:38 AM  Result Value Ref Range   B Natriuretic Peptide 1,223.4 (H) 0.0 - 100.0 pg/mL    Comment: Performed at [facility] [address], [place], [place] [address]  Troponin I (High Sensitivity)     Status: Abnormal   Collection Time: 09/16/20  9:56 AM  Result Value Ref Range   Troponin I (High Sensitivity) 30 (H) <18 ng/L    Comment: (NOTE) Elevated high sensitivity troponin I (hsTnI) values and significant  changes across serial measurements may suggest ACS but many other  chronic and acute conditions are known to elevate hsTnI results.  Refer to the Links section for chest pain algorithms and additional  guidance. Performed at [facility], [place] [address] [place], [place] [address]   Lipase, blood     Status: None   Collection Time: 09/16/20  9:56 AM  Result  Value Ref Range   Lipase 27 11 - 51 U/L    Comment: Performed at [facility] [address], [place], [place] [address]  Hepatic function panel     Status: Abnormal   Collection Time: 09/16/20  9:56 AM  Result Value Ref Range   Total Protein 7.0 6.5 - 8.1 g/dL   Albumin 3.4 (L) 3.5 - 5.0 g/dL   AST 14 (L) 15 - 41 U/L   ALT 9 0 - 44 U/L   Alkaline Phosphatase 71 38 - 126 U/L   Total Bilirubin 0.6 0.3 - 1.2 mg/dL   Bilirubin, Direct <0.1 0.0 - 0.2 mg/dL   Indirect Bilirubin NOT CALCULATED 0.3 - 0.9 mg/dL    Comment: Performed at [facility] [address] [place], [place] [address]    DG Chest 2 View  Result Date: 09/16/2020 CLINICAL DATA:  Shortness of breath EXAM: CHEST - 2 VIEW COMPARISON:  February 23, 2020 FINDINGS: There is apparent scarring in the right upper lobe, stable. There is no edema or airspace opacity. There is cardiomegaly with pulmonary vascularity normal. No adenopathy. There is aortic atherosclerosis. No bone lesions. IMPRESSION: Stable scarring right upper lobe. No edema or airspace opacity. Stable cardiomegaly. Aortic Atherosclerosis (ICD10-I70.0). Electronically Signed   By: Lowella Grip III M.D.   On: 09/16/2020 08:09    Review of Systems  Constitutional: Negative for chills, diaphoresis and fever.  HENT: Negative for sore throat.   Eyes: Negative for discharge.  Respiratory: Positive for shortness of breath.   Cardiovascular: Positive for leg swelling. Negative for chest pain and palpitations.  Gastrointestinal: Positive for abdominal distention.  Genitourinary: Negative for difficulty urinating.  Neurological: Negative for dizziness.   Blood pressure (!) 216/89, pulse 92, temperature 98.1 F (36.7 C), temperature source Oral, resp. rate 19, SpO2 94 %. Physical Exam Constitutional:      Appearance: He is well-developed.  HENT:     Head: Normocephalic and atraumatic.  Eyes:     Extraocular Movements: Extraocular movements intact.      Pupils: Pupils are equal, round, and reactive to light.  Neck:     Vascular: JVD present.  Cardiovascular:     Rate and Rhythm: Normal rate and regular rhythm.     Heart sounds: Murmur (2/6 systolic/diastolic murmur noted) heard.  Gallop (S3 gallop noted) present.  Pulmonary:     Effort: Pulmonary effort is normal.     Breath sounds: Examination of the right-lower field reveals decreased breath sounds. Examination of the left-lower field reveals decreased breath sounds. Decreased breath sounds present. No rhonchi or rales.  Abdominal:     Palpations: Abdomen is soft.     Tenderness: There is no abdominal tenderness.  Musculoskeletal:     Cervical back: Normal range of motion and neck supple.     Comments: No clubbing, cyanosis, 2+ edema noted  Skin:    General: Skin is warm and dry.  Neurological:     General: No focal deficit present.     Mental Status: He is alert and oriented to person, place, and time.     Assessment/Plan: Acute on chronic decompensated congestive heart failure secondary to preserved LV systolic function secondary to noncompliance.to  Diet and fluid intake Minimally elevated high sensitivity troponin I secondary to demand ischemia/CHF/renal insufficiency Hypertensive heart disease with diastolic dysfunction. Valvular heart disease Hypertension. Diabetes mellitus. Hyperlipidemia. Cerebrovascular disease, history of PTA the remote past, internal carotid artery. Chronic kidney disease stage IV. Anemia of chronic disease.  6.  History of carcinoma of the prostate. GERD. Plan Agree with present management and switching furosemide to IV Lasix for now. Discussed with patient regarding compliance with medications and diet and fluid restriction and daily weights We will hold after 2-D echo as patient recently had 2-D echo. Charolette Forward 09/16/2020, 3:11 PM

## 2020-09-16 NOTE — H&P (Signed)
History and Physical    Austin [name] BBC:[id number] DOB: 03/09/1935 DOA: 09/16/2020  PCP: Dorothyann Peng, NP (Confirm with patient/family/NH records and if not entered, this has to be entered at [facility] point of entry) Patient coming from: Home  I have personally briefly reviewed patient's old medical records in [facility]  Chief Complaint: SOB  HPI: Austin [name] is a 86 y.o. male with medical history significant of refractory HTN, chronic diastolic CHF, IIDM, CKD stage IV, HLD, presented with CHF symptoms.  Symptoms started 4 to 5 days ago, with increasing shortness of breath, initially with exertional, gradually became worse.  Woke up last night with severe shortness of breath.  Also developed a dry cough, no fever chills.  Denies any chest pain.  Patient also admitted that the family gathered together last week and has not been controlled his salt intake as well as fluid restrictions. ED Course: Blood pressure significantly elevated, chest x-ray showed lungs are congested.  Potassium 3.1, creatinine 3.5 similar to his baseline.  Review of Systems: As per HPI otherwise 14 point review of systems negative.    Past Medical History:  Diagnosis Date  . 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 CAROTID ARTERY STENOSIS 05/10/2010  . CHF (congestive heart failure) ([place])   . DIABETES MELLITUS, TYPE II 09/19/2007  . DISEASE, CEREBROVASCULAR NEC 03/05/2007  . GERD 03/13/2007  . HYPERLIPIDEMIA 03/05/2007  . HYPERTENSION 03/05/2007  . HYPOKALEMIA 11/09/2009  . KNEE PAIN, RIGHT 11/09/2009  . PROSTATE CANCER, HX OF 03/05/2007  . RENAL DISEASE, CHRONIC 02/03/2009    Past Surgical History:  Procedure Laterality Date  . CAROTID ARTERY ANGIOPLASTY Right Oct. 10, 2001  . ESOPHAGOGASTRODUODENOSCOPY (EGD) WITH PROPOFOL N/A 11/04/2016   Procedure: ESOPHAGOGASTRODUODENOSCOPY (EGD) WITH PROPOFOL;  Surgeon: Otis Brace, MD;  Location: [facility];  Service: Gastroenterology;  Laterality: N/A;  .  LAPAROSCOPIC APPENDECTOMY  02/20/2012   Procedure: APPENDECTOMY LAPAROSCOPIC;  Surgeon: Stark Klein, MD;  Location: [facility];  Service: General;  Laterality: N/A;  . PROSTATE SURGERY     prostatectomy     reports that he quit smoking about 46 years ago. His smoking use included cigarettes. He has never used smokeless tobacco. He reports that he does not drink alcohol and does not use drugs.  Allergies  Allergen Reactions  . Aspirin Other (See Comments)    High doses causes stomach ulcer and bleeding    Family History  Problem Relation Age of Onset  . Hypertension Mother   . Cancer Father        Mesothelioma   . Stomach cancer Brother   . Cancer Brother   . Cancer - Cervical Brother   . Cancer Brother   . Diabetes Brother   . Esophageal cancer Neg Hx   . Colon cancer Neg Hx   . Pancreatic cancer Neg Hx      Prior to Admission medications   Medication Sig Start Date End Date Taking? Authorizing Provider  acetaminophen (TYLENOL) 325 MG tablet Take 650 mg by mouth every 6 (six) hours as needed for mild pain or moderate pain.    [provider]  amLODipine (NORVASC) 10 MG tablet TAKE 1 TABLET BY MOUTH EVERY DAY 06/29/20   Nafziger, Tommi Rumps, NP  atorvastatin (LIPITOR) 20 MG tablet TAKE 1 TABLET BY MOUTH EVERY DAY 06/29/20   Nafziger, Tommi Rumps, NP  Blood Glucose Monitoring Suppl (ACCU-CHEK AVIVA PLUS) w/Device KIT Used to check blood glucose 2 times a day or PRN Patient taking differently: 1  each by Other route See admin instructions. Used to check blood glucose 2 times a day or PRN 09/04/19   Nafziger, Tommi Rumps, NP  cholecalciferol (VITAMIN D) 25 MCG (1000 UNIT) tablet Take 1,000 Units by mouth daily. 08/24/20   [provider]  cloNIDine (CATAPRES) 0.2 MG tablet TAKE 1 TABLET (0.2 MG TOTAL) BY MOUTH 3 (THREE) TIMES DAILY. 04/21/20   Nafziger, Tommi Rumps, NP  cloNIDine (CATAPRES) 0.3 MG tablet Take 1 tablet (0.3 mg total) by mouth 3 (three) times daily. 02/26/20 03/27/20  Cherylann Ratel A, DO   clopidogrel (PLAVIX) 75 MG tablet TAKE 1 TABLET BY MOUTH EVERY DAY 05/20/20   Nafziger, Tommi Rumps, NP  [employer]S D3 25 MCG (1000 UT) capsule Take 1,000 Units by mouth daily. 05/07/19   [provider]  doxazosin (CARDURA) 2 MG tablet Take 2 mg by mouth at bedtime.  12/28/17   [provider]  esomeprazole (NEXIUM) 40 MG capsule TAKE 1 CAPSULE BY MOUTH EVERY DAY 05/20/20   Nafziger, Tommi Rumps, NP  furosemide (LASIX) 40 MG tablet  02/29/20   [provider]  gabapentin (NEURONTIN) 100 MG capsule Take 1 capsule (100 mg total) by mouth 3 (three) times daily. 03/06/20   Felipa Furnace, DPM  glipiZIDE (GLUCOTROL XL) 5 MG 24 hr tablet Take 1 tablet (5 mg total) by mouth daily. Need diabetic follow up for further refills 06/24/20   Nafziger, Tommi Rumps, NP  glucose blood (ACCU-CHEK AVIVA PLUS) test strip Used to check blood glucose BID or PRN Patient taking differently: 1 each by Other route See admin instructions. Used to check blood glucose BID or PRN 09/04/19   Dorothyann Peng, NP  hydrALAZINE (APRESOLINE) 50 MG tablet Take 1 tablet (50 mg total) by mouth 3 (three) times daily. 08/28/20   Nafziger, Tommi Rumps, NP  hydrocortisone (ANUSOL-HC) 2.5 % rectal cream PLACE 1 APPLICATION RECTALLY 2 TIMES A DAY. 08/28/20   Nafziger, Tommi Rumps, NP  isosorbide dinitrate (ISORDIL) 20 MG tablet Take 1 tablet (20 mg total) by mouth 3 (three) times daily. 08/28/20   Nafziger, Tommi Rumps, NP  KLOR-CON M10 10 MEQ tablet Take 10 mEq by mouth daily. 07/25/19   [provider]  lactulose (CHRONULAC) 10 GM/15ML solution TAKE 15 ML BY MOUTH 2 TIMES DAILY AS NEEDED FOR MILD CONSTIPATION. 06/16/20   Nafziger, Tommi Rumps, NP  Lancets (ACCU-CHEK MULTICLIX) lancets Used to check blood glucose BID or PRN Patient taking differently: 1 each by Other route See admin instructions. Used to check blood glucose BID or PRN 09/04/19   Dorothyann Peng, NP  losartan (COZAAR) 25 MG tablet  05/20/20   [provider]  metoprolol succinate (TOPROL-XL) 100 MG 24 hr  tablet Take 100 mg by mouth daily. 11/06/19   [provider]  metoprolol succinate (TOPROL-XL) 50 MG 24 hr tablet Take 50 mg by mouth daily. 07/03/20   [provider]  POLY-IRON 150 150 MG capsule Take 150 mg by mouth daily. 01/27/20   [provider]  Propylene Glycol (SYSTANE BALANCE OP) Place 1 drop into both eyes daily as needed.    [provider]  torsemide (DEMADEX) 20 MG tablet Take 1 tablet (20 mg total) by mouth 2 (two) times daily. 02/26/20 03/27/20  Cherylann Ratel A, DO  triamcinolone cream (KENALOG) 0.1 % Apply 1 application topically 2 (two) times daily. 07/05/19   [provider]    Physical Exam: Vitals:   09/16/20 1100 09/16/20 1130 09/16/20 1200 09/16/20 1230  BP: (!) 187/71 (!) 239/104 (!) 194/88 (!) 214/91  Pulse: 81 (!) 112 92 100  Resp: 16 (!) '26 13 18  ' Temp:      TempSrc:      SpO2: 94% 94% 93% 92%    Constitutional: NAD, calm, comfortable Vitals:   09/16/20 1100 09/16/20 1130 09/16/20 1200 09/16/20 1230  BP: (!) 187/71 (!) 239/104 (!) 194/88 (!) 214/91  Pulse: 81 (!) 112 92 100  Resp: 16 (!) '26 13 18  ' Temp:      TempSrc:      SpO2: 94% 94% 93% 92%   Eyes: PERRL, lids and conjunctivae normal ENMT: Mucous membranes are moist. Posterior pharynx clear of any exudate or lesions.Normal dentition.  Neck: normal, supple, no masses, no thyromegaly Respiratory: clear to auscultation bilaterally, no wheezing, fine crackles to B/L mid fields.  Increasing respiratory effort. No accessory muscle use.  Cardiovascular: Regular rate and rhythm, no murmurs / rubs / gallops.  1+ extremity edema. 2+ pedal pulses. No carotid bruits.  Abdomen: no tenderness, no masses palpated. No hepatosplenomegaly. Bowel sounds positive.  Musculoskeletal: no clubbing / cyanosis. No joint deformity upper and lower extremities. Good ROM, no contractures. Normal muscle tone.  Skin: no rashes, lesions, ulcers. No induration Neurologic: CN 2-12 grossly intact.  Sensation intact, DTR normal. Strength 5/5 in all 4.  Psychiatric: Normal judgment and insight. Alert and oriented x 3. Normal mood.     Labs on Admission: I have personally reviewed following labs and imaging studies  CBC: Recent Labs  Lab 09/16/20 0759  WBC 5.7  HGB 10.0*  HCT 32.0*  MCV 84.7  PLT 850   Basic Metabolic Panel: Recent Labs  Lab 09/16/20 0759  NA 142  K 3.1*  CL 107  CO2 23  GLUCOSE 135*  BUN 35*  CREATININE 3.51*  CALCIUM 8.7*   GFR: CrCl cannot be calculated (Unknown ideal weight.). Liver Function Tests: Recent Labs  Lab 09/16/20 0956  AST 14*  ALT 9  ALKPHOS 71  BILITOT 0.6  PROT 7.0  ALBUMIN 3.4*   Recent Labs  Lab 09/16/20 0956  LIPASE 27   No results for input(s): AMMONIA in the last 168 hours. Coagulation Profile: No results for input(s): INR, PROTIME in the last 168 hours. Cardiac Enzymes: No results for input(s): CKTOTAL, CKMB, CKMBINDEX, TROPONINI in the last 168 hours. BNP (last 3 results) No results for input(s): PROBNP in the last 8760 hours. HbA1C: No results for input(s): HGBA1C in the last 72 hours. CBG: No results for input(s): GLUCAP in the last 168 hours. Lipid Profile: No results for input(s): CHOL, HDL, LDLCALC, TRIG, CHOLHDL, LDLDIRECT in the last 72 hours. Thyroid Function Tests: No results for input(s): TSH, T4TOTAL, FREET4, T3FREE, THYROIDAB in the last 72 hours. Anemia Panel: No results for input(s): VITAMINB12, FOLATE, FERRITIN, TIBC, IRON, RETICCTPCT in the last 72 hours. Urine analysis:    Component Value Date/Time   COLORURINE YELLOW 04/03/2019 1640   APPEARANCEUR CLEAR 04/03/2019 1640   LABSPEC 1.017 04/03/2019 1640   PHURINE 5.0 04/03/2019 1640   GLUCOSEU 50 (A) 04/03/2019 1640   HGBUR NEGATIVE 04/03/2019 1640   HGBUR negative 09/19/2007 0000   BILIRUBINUR NEGATIVE 04/03/2019 1640   BILIRUBINUR n 11/18/2014 1003   KETONESUR NEGATIVE 04/03/2019 1640   PROTEINUR >=300 (A) 04/03/2019 1640    UROBILINOGEN 0.2 11/18/2014 1003   UROBILINOGEN 1.0 01/26/2013 2059   NITRITE NEGATIVE 04/03/2019 1640   LEUKOCYTESUR NEGATIVE 04/03/2019 1640    Radiological Exams on Admission: DG Chest 2 View  Result Date: 09/16/2020 CLINICAL DATA:  Shortness of breath EXAM: CHEST - 2 VIEW COMPARISON:  February 23, 2020 FINDINGS: There is apparent scarring in the right upper lobe, stable. There is no edema or airspace opacity. There is cardiomegaly with pulmonary vascularity normal. No adenopathy. There is aortic atherosclerosis. No bone lesions. IMPRESSION: Stable scarring right upper lobe. No edema or airspace opacity. Stable cardiomegaly. Aortic Atherosclerosis (ICD10-I70.0). Electronically Signed   By: Lowella Grip III M.D.   On: 09/16/2020 08:09    EKG: Independently reviewed.  LVH and chronic secondary ST-T changes on V5 V6  Assessment/Plan Active Problems:   CHF (congestive heart failure) ([place])  (please populate well all problems here in Problem List. (For example, if patient is on BP meds at home and you resume or decide to hold them, it is a problem that needs to be her. Same for CAD, COPD, HLD and so on)  Acute on chronic diastolic CHF decompensation -From uncontrolled hypertension and noncompliant with salt and fluid intake. -Discussed with patient's cardiology Dr. Terrence Dupont at bedside, given patient's significant history of refractory HTN, cardiology recommend continue all his home BP meds and readjust during hospital stay. -IV Lasix -Repeat chest x-ray tomorrow -Echocardiogram  IIDM -Change to sliding scale for stringent glucose control  HTN refractory -As above  CKD stage IV -Fluid overload -Creatinine level stable, IV Lasix and daily BMP -Continue all his BP meds including ARB  Hypokalemia -Replace with p.o. and IV recheck tomorrow -Magnesium level  Chronic iron deficiency anemia -Continue iron supplement -Consider outpatient nephrology follow-up for periotic Epo    DVT  prophylaxis: Heparin subcu  code Status: Full Code Family Communication: none at bedside Disposition Plan: Expect 1 to 2 days hospital stay for aggressive IV diuresis and BP meds adjustment. Consults called: Dr. Terrence Dupont Admission status: Telemetry admission   Lequita Halt MD [facility] Pager [phone]  09/16/2020, 2:37 PM

## 2020-09-16 NOTE — ED Notes (Signed)
Dr. Terrence Dupont paged to [place], [place] paged by Levada Dy

## 2020-09-16 NOTE — ED Provider Notes (Signed)
[facility] EMERGENCY DEPARTMENT Provider Note   CSN: [id number] Arrival date & time: 09/16/20  9518     History Chief Complaint  Patient presents with  . Shortness of Breath    Austin [name] is a 86 y.o. male history of obesity, CAD, CHF, diabetes, hypertension, hyperlipidemia.  Patient today for shortness of breath onset 4 days ago reports increasing difficulty with ambulation needing to stop and rest after only 13 steps, also endorses difficulty laying down due to increasing shortness of breath.  Reports that he has been compliant with his medications.  He reports a mild chest pressure when symptoms occur resolves with rest.  Associated with bilateral lower extremity swelling.  Denies fever/chills, fall/injury, recent illness, cough/hemoptysis, vomiting, diarrhea or any additional concerns.  HPI     Past Medical History:  Diagnosis Date  . 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 CAROTID ARTERY STENOSIS 05/10/2010  . CHF (congestive heart failure) ([employer])   . DIABETES MELLITUS, TYPE II 09/19/2007  . DISEASE, CEREBROVASCULAR NEC 03/05/2007  . GERD 03/13/2007  . HYPERLIPIDEMIA 03/05/2007  . HYPERTENSION 03/05/2007  . HYPOKALEMIA 11/09/2009  . KNEE PAIN, RIGHT 11/09/2009  . PROSTATE CANCER, HX OF 03/05/2007  . RENAL DISEASE, CHRONIC 02/03/2009    Patient Active Problem List   Diagnosis Date Noted  . CHF (congestive heart failure) ([place]) 09/16/2020  . Acute CHF (congestive heart failure) ([place]) 02/23/2020  . Acute exacerbation of CHF (congestive heart failure) ([place]) 08/04/2019  . CKD (chronic kidney disease), stage IV ([place]) 08/04/2019  . Elevated troponin 08/04/2019  . Hypokalemia 08/04/2019  . Hypertensive urgency 03/28/2019  . CKD (chronic kidney disease), stage III ([place]) 03/28/2019  . 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 Gastrointestinal hemorrhage with melena   . Melena 11/03/2016  . Carotid artery stenosis 05/10/2010  . Diabetes  mellitus with renal complications ([place]) 84/16/6063  . Iron deficiency anemia due to chronic blood loss 03/13/2007  . GERD 03/13/2007  . Dyslipidemia 03/05/2007  . Essential hypertension 03/05/2007  . DISEASE, CEREBROVASCULAR NEC 03/05/2007  . PROSTATE CANCER, HX OF 03/05/2007    Past Surgical History:  Procedure Laterality Date  . CAROTID ARTERY ANGIOPLASTY Right Oct. 10, 2001  . ESOPHAGOGASTRODUODENOSCOPY (EGD) WITH PROPOFOL N/A 11/04/2016   Procedure: ESOPHAGOGASTRODUODENOSCOPY (EGD) WITH PROPOFOL;  Surgeon: Otis Brace, MD;  Location: [facility];  Service: Gastroenterology;  Laterality: N/A;  . LAPAROSCOPIC APPENDECTOMY  02/20/2012   Procedure: APPENDECTOMY LAPAROSCOPIC;  Surgeon: Stark Klein, MD;  Location: [facility] OR;  Service: General;  Laterality: N/A;  . PROSTATE SURGERY     prostatectomy       Family History  Problem Relation Age of Onset  . Hypertension Mother   . Cancer Father        Mesothelioma   . Stomach cancer Brother   . Cancer Brother   . Cancer - Cervical Brother   . Cancer Brother   . Diabetes Brother   . Esophageal cancer Neg Hx   . Colon cancer Neg Hx   . Pancreatic cancer Neg Hx     Social History   Tobacco Use  . Smoking status: Former Smoker    Types: Cigarettes    Quit date: 08/22/1974    Years since quitting: 46.1  . Smokeless tobacco: Never Used  Vaping Use  . Vaping Use: Never used  Substance Use Topics  . Alcohol use: No    Alcohol/week: 0.0 standard drinks  . Drug use: No    Home  Medications Prior to Admission medications   Medication Sig Start Date End Date Taking? Authorizing Provider  acetaminophen (TYLENOL) 325 MG tablet Take 650 mg by mouth every 6 (six) hours as needed for mild pain or moderate pain.    [provider]  amLODipine (NORVASC) 10 MG tablet TAKE 1 TABLET BY MOUTH EVERY DAY 06/29/20   Nafziger, Tommi Rumps, NP  atorvastatin (LIPITOR) 20 MG tablet TAKE 1 TABLET BY MOUTH EVERY DAY 06/29/20   Nafziger, Tommi Rumps, NP   Blood Glucose Monitoring Suppl (ACCU-CHEK AVIVA PLUS) w/Device KIT Used to check blood glucose 2 times a day or PRN Patient taking differently: 1 each by Other route See admin instructions. Used to check blood glucose 2 times a day or PRN 09/04/19   Nafziger, Tommi Rumps, NP  cloNIDine (CATAPRES) 0.2 MG tablet TAKE 1 TABLET (0.2 MG TOTAL) BY MOUTH 3 (THREE) TIMES DAILY. 04/21/20   Nafziger, Tommi Rumps, NP  cloNIDine (CATAPRES) 0.3 MG tablet Take 1 tablet (0.3 mg total) by mouth 3 (three) times daily. 02/26/20 03/27/20  Cherylann Ratel A, DO  clopidogrel (PLAVIX) 75 MG tablet TAKE 1 TABLET BY MOUTH EVERY DAY 05/20/20   Nafziger, Tommi Rumps, NP  [employer] D3 25 MCG (1000 UT) capsule Take 1,000 Units by mouth daily. 05/07/19   [provider]  doxazosin (CARDURA) 2 MG tablet Take 2 mg by mouth at bedtime.  12/28/17   [provider]  esomeprazole (NEXIUM) 40 MG capsule TAKE 1 CAPSULE BY MOUTH EVERY DAY 05/20/20   Nafziger, Tommi Rumps, NP  furosemide (LASIX) 40 MG tablet  02/29/20   [provider]  gabapentin (NEURONTIN) 100 MG capsule Take 1 capsule (100 mg total) by mouth 3 (three) times daily. 03/06/20   Felipa Furnace, DPM  glipiZIDE (GLUCOTROL XL) 5 MG 24 hr tablet Take 1 tablet (5 mg total) by mouth daily. Need diabetic follow up for further refills 06/24/20   Nafziger, Tommi Rumps, NP  glucose blood (ACCU-CHEK AVIVA PLUS) test strip Used to check blood glucose BID or PRN Patient taking differently: 1 each by Other route See admin instructions. Used to check blood glucose BID or PRN 09/04/19   Dorothyann Peng, NP  hydrALAZINE (APRESOLINE) 50 MG tablet Take 1 tablet (50 mg total) by mouth 3 (three) times daily. 08/28/20   Nafziger, Tommi Rumps, NP  hydrocortisone (ANUSOL-HC) 2.5 % rectal cream PLACE 1 APPLICATION RECTALLY 2 TIMES A DAY. 08/28/20   Nafziger, Tommi Rumps, NP  isosorbide dinitrate (ISORDIL) 20 MG tablet Take 1 tablet (20 mg total) by mouth 3 (three) times daily. 08/28/20   Nafziger, Tommi Rumps, NP  KLOR-CON M10 10 MEQ tablet Take 10 mEq  by mouth daily. 07/25/19   [provider]  lactulose (CHRONULAC) 10 GM/15ML solution TAKE 15 ML BY MOUTH 2 TIMES DAILY AS NEEDED FOR MILD CONSTIPATION. 06/16/20   Nafziger, Tommi Rumps, NP  Lancets (ACCU-CHEK MULTICLIX) lancets Used to check blood glucose BID or PRN Patient taking differently: 1 each by Other route See admin instructions. Used to check blood glucose BID or PRN 09/04/19   Dorothyann Peng, NP  losartan (COZAAR) 25 MG tablet  05/20/20   [provider]  metoprolol succinate (TOPROL-XL) 100 MG 24 hr tablet Take 100 mg by mouth daily. 11/06/19   [provider]  POLY-IRON 150 150 MG capsule Take 150 mg by mouth daily. 01/27/20   [provider]  Propylene Glycol (SYSTANE BALANCE OP) Place 1 drop into both eyes daily as needed.    [provider]  torsemide (DEMADEX) 20 MG  tablet Take 1 tablet (20 mg total) by mouth 2 (two) times daily. 02/26/20 03/27/20  Cherylann Ratel A, DO  triamcinolone cream (KENALOG) 0.1 % Apply 1 application topically 2 (two) times daily. 07/05/19   [provider]    Allergies    Aspirin  Review of Systems   Review of Systems Ten systems are reviewed and are negative for acute change except as noted in the HPI  Physical Exam Updated Vital Signs BP (!) 214/91   Pulse 100   Temp 98.1 F (36.7 C) (Oral)   Resp 18   SpO2 92%   Physical Exam Constitutional:      General: He is not in acute distress.    Appearance: Normal appearance. He is well-developed. He is not ill-appearing or diaphoretic.  HENT:     Head: Normocephalic and atraumatic.  Eyes:     General: Vision grossly intact. Gaze aligned appropriately.     Pupils: Pupils are equal, round, and reactive to light.  Neck:     Trachea: Trachea and phonation normal.  Cardiovascular:     Rate and Rhythm: Normal rate and regular rhythm.  Pulmonary:     Effort: Pulmonary effort is normal. No respiratory distress.     Breath sounds: Rales present.  Abdominal:      General: There is no distension.     Palpations: Abdomen is soft.     Tenderness: There is no abdominal tenderness. There is no guarding or rebound.  Musculoskeletal:        General: Normal range of motion.     Cervical back: Normal range of motion.     Right lower leg: Edema present.     Left lower leg: Edema present.  Skin:    General: Skin is warm and dry.  Neurological:     Mental Status: He is alert.     GCS: GCS eye subscore is 4. GCS verbal subscore is 5. GCS motor subscore is 6.     Comments: Speech is clear and goal oriented, follows commands Major Cranial nerves without deficit, no facial droop Moves extremities without ataxia, coordination intact  Psychiatric:        Behavior: Behavior normal.     ED Results / Procedures / Treatments   Labs (all labs ordered are listed, but only abnormal results are displayed) Labs Reviewed  BASIC METABOLIC PANEL - Abnormal; Notable for the following components:      Result Value   Potassium 3.1 (*)    Glucose, Bld 135 (*)    BUN 35 (*)    Creatinine, Ser 3.51 (*)    Calcium 8.7 (*)    GFR, Estimated 16 (*)    All other components within normal limits  CBC - Abnormal; Notable for the following components:   RBC 3.78 (*)    Hemoglobin 10.0 (*)    HCT 32.0 (*)    All other components within normal limits  BRAIN NATRIURETIC PEPTIDE - Abnormal; Notable for the following components:   B Natriuretic Peptide 1,223.4 (*)    All other components within normal limits  HEPATIC FUNCTION PANEL - Abnormal; Notable for the following components:   Albumin 3.4 (*)    AST 14 (*)    All other components within normal limits  TROPONIN I (HIGH SENSITIVITY) - Abnormal; Notable for the following components:   Troponin I (High Sensitivity) 34 (*)    All other components within normal limits  TROPONIN I (HIGH SENSITIVITY) - Abnormal; Notable for the following  components:   Troponin I (High Sensitivity) 30 (*)    All other components within  normal limits  SARS CORONAVIRUS 2 BY RT PCR [facility] ORDER, [place] LAB)  LIPASE, BLOOD    EKG EKG Interpretation  Date/Time:  Wednesday September 16 2020 09:55:23 EST Ventricular Rate:  79 PR Interval:    QRS Duration: 100 QT Interval:  411 QTC Calculation: 472 R Axis:   -26 Text Interpretation: Sinus rhythm Prolonged PR interval LVH with secondary repolarization abnormality No significant change since last tracing Confirmed by Blanchie Dessert [phone]) on 09/16/2020 9:57:12 AM   Radiology DG Chest 2 View  Result Date: 09/16/2020 CLINICAL DATA:  Shortness of breath EXAM: CHEST - 2 VIEW COMPARISON:  February 23, 2020 FINDINGS: There is apparent scarring in the right upper lobe, stable. There is no edema or airspace opacity. There is cardiomegaly with pulmonary vascularity normal. No adenopathy. There is aortic atherosclerosis. No bone lesions. IMPRESSION: Stable scarring right upper lobe. No edema or airspace opacity. Stable cardiomegaly. Aortic Atherosclerosis (ICD10-I70.0). Electronically Signed   By: Lowella Grip III M.D.   On: 09/16/2020 08:09    Procedures Procedures   Medications Ordered in ED Medications  potassium chloride 10 mEq in 100 mL IVPB (has no administration in time range)  amLODipine (NORVASC) tablet 10 mg (10 mg Oral Given 09/16/20 1126)  hydrALAZINE (APRESOLINE) tablet 50 mg (50 mg Oral Given 09/16/20 1126)  furosemide (LASIX) injection 40 mg (40 mg Intravenous Given 09/16/20 1122)    ED Course  I have reviewed the triage vital signs and the nursing notes.  Pertinent labs & imaging results that were available during my care of the patient were reviewed by me and considered in my medical decision making (see chart for details).    MDM Rules/Calculators/A&P                         Additional history obtained from: 1. Nursing notes from this visit. 2. Review of electronic medical records -------------- I ordered, reviewed and  interpreted labs which include: High-sensitivity troponin 34 ->30 BNP 1223.4 LFTs unremarkable CBC hemoglobin 10.0 BMP baseline kidney function, hypokalemia 3.1 Covid test negative  CXR:    IMPRESSION:  Stable scarring right upper lobe. No edema or airspace opacity.  Stable cardiomegaly.    Aortic Atherosclerosis (ICD10-I70.0).   EKG: Sinus rhythm Prolonged PR interval LVH with secondary repolarization abnormality No significant change since last tracing Confirmed by Blanchie Dessert [phone]) on 09/16/2020 9:57:12 AM - Patient reassessed resting comfortably no acute distress not requiring oxygen at this time.  He did get up to use the bathroom at one point became short of breath had to sit back down.  Due to patient's worsening of symptoms he will need admission for treatment of CHF exacerbation.  He was given his home dose blood pressure medications on arrival as he missed his dose this morning lower suspicion for this to be hypertensive urgency/emergency at this time.  IV Lasix given for treatment of CHF exacerbation.  Potassium repletion begun with IV potassium chloride.  Low suspicion for ACS, PE, dissection or other emergent cardiopulmonary etiologies at this time.  Consult called to patient's cardiologist Dr. Terrence Dupont who agreed with CHF treatment and admission to the hospital.  He asked that patient be admitted to [facility] and reports he is available for consultation if needed.  Consult with Dr. Roosevelt Locks, patient was accepted to medicine service. - 12:53 PM: Patient reevaluated resting  comfortably in bed no acute distress vital signs stable on room air.  He is agreeable for admission no new complaints or concerns.  Patient was seen and evaluated by Dr. Maryan Rued during this visit who agrees with plan of care  Note: Portions of this report may have been transcribed using voice recognition software. Every effort was made to ensure accuracy; however, inadvertent computerized transcription  errors may still be present. Final Clinical Impression(s) / ED Diagnoses Final diagnoses:  Acute on chronic congestive heart failure, unspecified heart failure type [facility])    Rx / DC Orders ED Discharge Orders    None       Gari Crown 09/16/20 1254    Blanchie Dessert, MD 09/19/20 0745

## 2020-09-16 NOTE — ED Triage Notes (Signed)
Patient complains of exertional shortness of breath for several days. Fully vaccinated against COVID, reports negative COVID test approximately ten days ago. BP 202/88 in triage, history of hypertension, did not take antihypertensive medications today.

## 2020-09-16 NOTE — Progress Notes (Addendum)
HOSPITAL MEDICINE OVERNIGHT EVENT NOTE    Notified by nursing the patient is exhibiting worsening respiratory distress.  No significant hypoxia however patient was placed on supplemental oxygen by nursing resulting in some improvement in symptoms.  According to nursing, patient is tachypneic with respiratory rate in the 20s and exhibiting some expiratory wheezing with rales.  Chart reviewed, patient is experiencing acute diastolic congestive heart failure likely exacerbated by hypertensive urgency.  Apparently, patient has no further doses of intravenous Lasix ordered since the Lasix given this morning in the emergency department.  Ordering additional 60 mg of IV Lasix (high dose due to presence of high creatinine) now and will leave further dosing up to daytime provider.  Ordering as needed intravenous hydralazine for excessively elevated blood pressure.  Ordering as needed bronchodilators for observed wheezing.  Continuing to monitor closely.    Austin Emerald  MD [facility]   ADDENDUM (5:50AM)  Potassium 2.9 this AM.  Ordering 80mEq PO Potassium x 2 doses.  Adding on Magnesium to AM labs.   Austin [name]

## 2020-09-17 ENCOUNTER — Inpatient Hospital Stay (HOSPITAL_COMMUNITY): Payer: Medicare PPO

## 2020-09-17 DIAGNOSIS — I5031 Acute diastolic (congestive) heart failure: Secondary | ICD-10-CM

## 2020-09-17 DIAGNOSIS — N184 Chronic kidney disease, stage 4 (severe): Secondary | ICD-10-CM

## 2020-09-17 DIAGNOSIS — I160 Hypertensive urgency: Secondary | ICD-10-CM

## 2020-09-17 HISTORY — DX: Acute diastolic (congestive) heart failure: I50.31

## 2020-09-17 LAB — URINALYSIS, ROUTINE W REFLEX MICROSCOPIC
Bacteria, UA: NONE SEEN
Bilirubin Urine: NEGATIVE
Glucose, UA: 50 mg/dL — AB
Ketones, ur: NEGATIVE mg/dL
Leukocytes,Ua: NEGATIVE
Nitrite: NEGATIVE
Protein, ur: >=300 mg/dL — AB
Specific Gravity, Urine: 1.012 (ref 1.005–1.030)
pH: 6 (ref 5.0–8.0)

## 2020-09-17 LAB — BLOOD GAS, ARTERIAL
Acid-base deficit: 0.5 mmol/L (ref 0.0–2.0)
Bicarbonate: 23.2 mmol/L (ref 20.0–28.0)
FIO2: 21
O2 Saturation: 88.6 %
Patient temperature: 37
pCO2 arterial: 35.2 mmHg (ref 32.0–48.0)
pH, Arterial: 7.435 (ref 7.350–7.450)
pO2, Arterial: 54.7 mmHg — ABNORMAL LOW (ref 83.0–108.0)

## 2020-09-17 LAB — BASIC METABOLIC PANEL
Anion gap: 11 (ref 5–15)
BUN: 32 mg/dL — ABNORMAL HIGH (ref 8–23)
CO2: 22 mmol/L (ref 22–32)
Calcium: 8.6 mg/dL — ABNORMAL LOW (ref 8.9–10.3)
Chloride: 110 mmol/L (ref 98–111)
Creatinine, Ser: 3.6 mg/dL — ABNORMAL HIGH (ref 0.61–1.24)
GFR, Estimated: 16 mL/min — ABNORMAL LOW (ref >60)
Glucose, Bld: 128 mg/dL — ABNORMAL HIGH (ref 70–99)
Potassium: 2.9 mmol/L — ABNORMAL LOW (ref 3.5–5.1)
Sodium: 143 mmol/L (ref 135–145)

## 2020-09-17 LAB — GLUCOSE, CAPILLARY
Glucose-Capillary: 128 mg/dL — ABNORMAL HIGH (ref 70–99)
Glucose-Capillary: 128 mg/dL — ABNORMAL HIGH (ref 70–99)
Glucose-Capillary: 139 mg/dL — ABNORMAL HIGH (ref 70–99)
Glucose-Capillary: 139 mg/dL — ABNORMAL HIGH (ref 70–99)

## 2020-09-17 LAB — D-DIMER, QUANTITATIVE: D-Dimer, Quant: 1.43 ug/mL-FEU — ABNORMAL HIGH (ref 0.00–0.50)

## 2020-09-17 LAB — MAGNESIUM: Magnesium: 1.9 mg/dL (ref 1.7–2.4)

## 2020-09-17 MED ORDER — HYDRALAZINE HCL 50 MG PO TABS
100.0000 mg | ORAL_TABLET | Freq: Three times a day (TID) | ORAL | Status: DC
Start: 2020-09-17 — End: 2020-09-20
  Administered 2020-09-17 – 2020-09-20 (×9): 100 mg via ORAL
  Filled 2020-09-17 (×10): qty 2

## 2020-09-17 MED ORDER — METOPROLOL SUCCINATE ER 100 MG PO TB24
100.0000 mg | ORAL_TABLET | Freq: Every day | ORAL | Status: DC
  Administered 2020-09-18 – 2020-09-20 (×3): 100 mg via ORAL
  Filled 2020-09-17 (×3): qty 1

## 2020-09-17 MED ORDER — FUROSEMIDE 10 MG/ML IJ SOLN
80.0000 mg | Freq: Two times a day (BID) | INTRAMUSCULAR | Status: DC
  Administered 2020-09-17 – 2020-09-19 (×4): 80 mg via INTRAVENOUS
  Filled 2020-09-17 (×4): qty 8

## 2020-09-17 MED ORDER — TORSEMIDE 20 MG PO TABS
20.0000 mg | ORAL_TABLET | Freq: Two times a day (BID) | ORAL | Status: DC
  Administered 2020-09-17: 20 mg via ORAL
  Filled 2020-09-17: qty 1

## 2020-09-17 MED ORDER — POTASSIUM CHLORIDE CRYS ER 20 MEQ PO TBCR
20.0000 meq | EXTENDED_RELEASE_TABLET | Freq: Two times a day (BID) | ORAL | Status: DC
  Administered 2020-09-17 – 2020-09-20 (×6): 20 meq via ORAL
  Filled 2020-09-17: qty 2
  Filled 2020-09-17: qty 1
  Filled 2020-09-17 (×3): qty 2
  Filled 2020-09-17 (×2): qty 1
  Filled 2020-09-17 (×3): qty 2

## 2020-09-17 MED ORDER — METOPROLOL SUCCINATE ER 50 MG PO TB24
50.0000 mg | ORAL_TABLET | Freq: Once | ORAL | Status: AC
  Administered 2020-09-17: 50 mg via ORAL
  Filled 2020-09-17: qty 1

## 2020-09-17 MED ORDER — POTASSIUM CHLORIDE CRYS ER 20 MEQ PO TBCR
40.0000 meq | EXTENDED_RELEASE_TABLET | ORAL | Status: AC
  Administered 2020-09-17 (×2): 40 meq via ORAL
  Filled 2020-09-17 (×2): qty 2

## 2020-09-17 NOTE — Plan of Care (Signed)
?  Problem: Coping: ?Goal: Level of anxiety will decrease ?Outcome: Progressing ?  ?Problem: Safety: ?Goal: Ability to remain free from injury will improve ?Outcome: Progressing ?  ?

## 2020-09-17 NOTE — Progress Notes (Addendum)
PROGRESS NOTE    Austin [name]   ACZ:[id number]  DOB: Mar 25, 1935  DOA: 09/16/2020 PCP: Dorothyann Peng, NP   Brief Narrative:  Austin [name]  is a 86 y.o. male with medical history significant of refractory HTN, chronic diastolic CHF, IIDM, CKD stage IV, HLD, presented with shortness of breath for several days. In ED found to have a BP of 202/88 and pulmonary edema and admitted for diuresis.    Subjective: Dyspnea and cough have improved. He has no other complaints. He was noted to have a fever in the hospital. He was unaware of feeling febrile and has not been checking temperatures at home.     Assessment & Plan:   Principal Problem:   Acute diastolic CHF (congestive heart failure) - has been adequately diuresed and IV Lasix is now on hold - breathing better and pedal edema resolved - suspect high BP contributed to his fluid overload- he also admits to being poorly complaint with his diet over the past week as it was his birthday. Addendum:  ABG reveals hypoxemia (on room air).  CT scan w/o contrast reveals findings consistent with pulmonary edema - will start IV Lasix at 80 mg BID and follow  Active Problems:    Hypertensive urgency with elevated troponin and CHF - he states his BP has never been well controlled since he was diagnosed in his 80s - will double his Toprol and his Hydralazine-   - cont Cozaar and Isosorbide - will need to watch overnight and see if BP remains controlled on current doses  Fever - 100.4 this AM - CXR unrevealing - f/u UA and blood cultures - Q4 hr vitals ordered to follow for further temps  Hypokalemia - severe @ 2.9 - due to Lasix - replete and follow - Mg is normal    CKD (chronic kidney disease), stage IV   - Cr rising due to diuretics- holding diuretics    Diabetes mellitus with renal complications   - cont SSI Component Value Date/Time   HGBA1C 6.8 (H) 09/16/2020 1600      PROSTATE CANCER, HX OF  Time spent in minutes: 35 DVT  prophylaxis: heparin injection 5,000 Units Start: 09/16/20 1400    Code Status: Full code Family Communication:  Level of Care: Level of care: Telemetry Medical Disposition Plan:  Status is: Inpatient  Remains inpatient appropriate because:Inpatient level of care appropriate due to severity of illness  - f/u fever, K+ and BP   Dispo: The patient is from: Home              Anticipated d/c is to: Home              Anticipated d/c date is: 2 days              Patient currently is not medically stable to d/c.   Difficult to place patient No  Consultants:   cardiology Procedures:   none Antimicrobials:  Anti-infectives (From admission, onward)   None       Objective: Vitals:   09/17/20 0521 09/17/20 0820 09/17/20 1050 09/17/20 1241  BP: (!) 160/67 (!) 196/82 (!) 152/78 (!) 154/61  Pulse: 83 88  65  Resp: 19   18  Temp: (!) 100.4 F (38 C)   98.4 F (36.9 C)  TempSrc: Oral   Oral  SpO2: 96% 94%  96%  Weight:      Height:        Intake/Output Summary (Last 24 hours) at 09/17/2020 1335  Last data filed at 09/17/2020 0900 Gross per 24 hour  Intake 1069.39 ml  Output 3000 ml  Net -1930.61 ml   Filed Weights   09/16/20 1516 09/17/20 0455  Weight: 83.7 kg 81.8 kg    Examination: General exam: Appears comfortable  HEENT: PERRLA, oral mucosa moist, no sclera icterus or thrush Respiratory system: Clear to auscultation. Respiratory effort normal. Cardiovascular system: S1 & S2 heard, RRR.   Gastrointestinal system: Abdomen soft, non-tender, nondistended. Normal bowel sounds. Central nervous system: Alert and oriented. No focal neurological deficits. Extremities: No cyanosis, clubbing or edema Skin: No rashes or ulcers Psychiatry:  Mood & affect appropriate.     Data Reviewed: I have personally reviewed following labs and imaging studies  CBC: Recent Labs  Lab 09/16/20 0759  WBC 5.7  HGB 10.0*  HCT 32.0*  MCV 84.7  PLT 604   Basic Metabolic  Panel: Recent Labs  Lab 09/16/20 0759 09/16/20 1604 09/17/20 0159  NA 142  --  143  K 3.1*  --  2.9*  CL 107  --  110  CO2 23  --  22  GLUCOSE 135*  --  128*  BUN 35*  --  32*  CREATININE 3.51*  --  3.60*  CALCIUM 8.7*  --  8.6*  MG  --  1.9 1.9   GFR: Estimated Creatinine Clearance: 15.6 mL/min (A) (by C-G formula based on SCr of 3.6 mg/dL (H)). Liver Function Tests: Recent Labs  Lab 09/16/20 0956  AST 14*  ALT 9  ALKPHOS 71  BILITOT 0.6  PROT 7.0  ALBUMIN 3.4*   Recent Labs  Lab 09/16/20 0956  LIPASE 27   No results for input(s): AMMONIA in the last 168 hours. Coagulation Profile: No results for input(s): INR, PROTIME in the last 168 hours. Cardiac Enzymes: No results for input(s): CKTOTAL, CKMB, CKMBINDEX, TROPONINI in the last 168 hours. BNP (last 3 results) No results for input(s): PROBNP in the last 8760 hours. HbA1C: Recent Labs    09/16/20 1600  HGBA1C 6.8*   CBG: Recent Labs  Lab 09/16/20 1610 09/16/20 2048 09/17/20 0605 09/17/20 1200  GLUCAP 124* 173* 128* 128*   Lipid Profile: No results for input(s): CHOL, HDL, LDLCALC, TRIG, CHOLHDL, LDLDIRECT in the last 72 hours. Thyroid Function Tests: No results for input(s): TSH, T4TOTAL, FREET4, T3FREE, THYROIDAB in the last 72 hours. Anemia Panel: No results for input(s): VITAMINB12, FOLATE, FERRITIN, TIBC, IRON, RETICCTPCT in the last 72 hours. Urine analysis:    Component Value Date/Time   COLORURINE YELLOW 04/03/2019 1640   APPEARANCEUR CLEAR 04/03/2019 1640   LABSPEC 1.017 04/03/2019 1640   PHURINE 5.0 04/03/2019 1640   GLUCOSEU 50 (A) 04/03/2019 1640   HGBUR NEGATIVE 04/03/2019 1640   HGBUR negative 09/19/2007 0000   BILIRUBINUR NEGATIVE 04/03/2019 1640   BILIRUBINUR n 11/18/2014 1003   KETONESUR NEGATIVE 04/03/2019 1640   PROTEINUR >=300 (A) 04/03/2019 1640   UROBILINOGEN 0.2 11/18/2014 1003   UROBILINOGEN 1.0 01/26/2013 2059   NITRITE NEGATIVE 04/03/2019 1640   LEUKOCYTESUR  NEGATIVE 04/03/2019 1640   Sepsis Labs: @LABRCNTIP (procalcitonin:4,lacticidven:4) ) Recent Results (from the past 240 hour(s))  SARS Coronavirus 2 by RT PCR (hospital order, performed in [place] hospital lab) Nasopharyngeal Nasopharyngeal Swab     Status: None   Collection Time: 09/16/20  7:57 AM   Specimen: Nasopharyngeal Swab  Result Value Ref Range Status   SARS Coronavirus 2 NEGATIVE NEGATIVE Final    Comment: (NOTE) SARS-CoV-2 target nucleic acids are NOT DETECTED.  The SARS-CoV-2 RNA is generally detectable in upper and lower respiratory specimens during the acute phase of infection. The lowest concentration of SARS-CoV-2 viral copies this assay can detect is 250 copies / mL. A negative result does not preclude SARS-CoV-2 infection and should not be used as the sole basis for treatment or other patient management decisions.  A negative result may occur with improper specimen collection / handling, submission of specimen other than nasopharyngeal swab, presence of viral mutation(s) within the areas targeted by this assay, and inadequate number of viral copies (<250 copies / mL). A negative result must be combined with clinical observations, patient history, and epidemiological information.  Fact Sheet for Patients:   StrictlyIdeas.no  Fact Sheet for Healthcare Providers: BankingDealers.co.za  This test is not yet approved or  cleared by the [place] FDA and has been authorized for detection and/or diagnosis of SARS-CoV-2 by FDA under an Emergency Use Authorization (EUA).  This EUA will remain in effect (meaning this test can be used) for the duration of the COVID-19 declaration under Section 564(b)(1) of the Act, 21 U.S.C. section 360bbb-3(b)(1), unless the authorization is terminated or revoked sooner.  Performed at [facility], [place] [address] [place], [place] [address]          Radiology  Studies: DG Chest 2 View  Result Date: 09/16/2020 CLINICAL DATA:  Shortness of breath EXAM: CHEST - 2 VIEW COMPARISON:  February 23, 2020 FINDINGS: There is apparent scarring in the right upper lobe, stable. There is no edema or airspace opacity. There is cardiomegaly with pulmonary vascularity normal. No adenopathy. There is aortic atherosclerosis. No bone lesions. IMPRESSION: Stable scarring right upper lobe. No edema or airspace opacity. Stable cardiomegaly. Aortic Atherosclerosis (ICD10-I70.0). Electronically Signed   By: Lowella Grip III M.D.   On: 09/16/2020 08:09   DG Chest Port 1 View  Result Date: 09/17/2020 CLINICAL DATA:  Congestive heart failure EXAM: PORTABLE CHEST 1 VIEW COMPARISON:  09/16/2020 FINDINGS: The lungs are symmetrically well expanded. The lungs are clear. No pneumothorax or pleural effusion. Cardiac size is mildly enlarged, unchanged. Mild central pulmonary vascular congestion is again seen without overt pulmonary edema. No acute bone abnormality. IMPRESSION: Stable cardiomegaly with central pulmonary vascular congestion. No overt pulmonary edema. Electronically Signed   By: Fidela Salisbury MD   On: 09/17/2020 06:20      Scheduled Meds: . amLODipine  10 mg Oral Daily  . atorvastatin  20 mg Oral Daily  . cloNIDine  0.3 mg Oral TID  . clopidogrel  75 mg Oral Daily  . doxazosin  2 mg Oral QHS  . heparin  5,000 Units Subcutaneous Q12H  . hydrALAZINE  100 mg Oral TID  . insulin aspart  0-9 Units Subcutaneous TID WC  . iron polysaccharides  150 mg Oral Daily  . isosorbide dinitrate  20 mg Oral TID  . losartan  12.5 mg Oral Daily  . [START ON 09/18/2020] metoprolol succinate  100 mg Oral Daily  . pantoprazole  40 mg Oral Daily  . potassium chloride  10 mEq Oral Daily  . sodium chloride flush  3 mL Intravenous Q12H  . triamcinolone  1 application Topical BID   Continuous Infusions: . sodium chloride       LOS: 1 day      Debbe Odea, MD [facility] Pager: [email or web address] 09/17/2020, 1:35 PM

## 2020-09-17 NOTE — Evaluation (Signed)
Physical Therapy Evaluation Patient Details Name: Austin [name] MRN: [id number] DOB: March 27, 1935 Today's Date: 09/17/2020   History of Present Illness  Pt adm with acute on chronic diastolic heart failure. PMH - HTN, DM, ckd.  Clinical Impression  Pt doing well with mobility and no further PT needed.  Ambulation distance limited by dyspnea which should improve with medical improvement. Mobility tech already following.       Follow Up Recommendations No PT follow up    Equipment Recommendations  None recommended by PT    Recommendations for Other Services Other (comment) (mobility tech following)     Precautions / Restrictions Precautions Precautions: None      Mobility  Bed Mobility Overal bed mobility: Modified Independent                  Transfers Overall transfer level: Modified independent Equipment used: None                Ambulation/Gait Ambulation/Gait assistance: Modified independent (Device/Increase time) Gait Distance (Feet): 175 Feet Assistive device: None Gait Pattern/deviations: WFL(Within Functional Limits) Gait velocity: decr Gait velocity interpretation: 1.31 - 2.62 ft/sec, indicative of limited community ambulator General Gait Details: Steady gait. Limited by dyspnea.  Stairs            Wheelchair Mobility    Modified Rankin (Stroke Patients Only)       Balance Overall balance assessment: Mild deficits observed, not formally tested                                           Pertinent Vitals/Pain Pain Assessment: No/denies pain    Home Living Family/patient expects to be discharged to:: Private residence Living Arrangements: Spouse/significant other Available Help at Discharge: Family;Available 24 hours/day Type of Home: House Home Access: Stairs to enter Entrance Stairs-Rails: Left Entrance Stairs-Number of Steps: 4 from the garage Home Layout: Multi-level;Bed/bath upstairs;1/2 bath on main  level Home Equipment: None      Prior Function Level of Independence: Independent         Comments: retired Engineer, technical sales Dominance   Dominant Hand: Right    Extremity/Trunk Assessment   Upper Extremity Assessment Upper Extremity Assessment: Overall WFL for tasks assessed    Lower Extremity Assessment Lower Extremity Assessment: Overall WFL for tasks assessed       Communication   Communication: No difficulties  Cognition Arousal/Alertness: Awake/alert Behavior During Therapy: WFL for tasks assessed/performed Overall Cognitive Status: Within Functional Limits for tasks assessed                                        General Comments General comments (skin integrity, edema, etc.): SpO2 90% on RA at rest, 88% with amb with recovery back to 90% after sitting for 30 sec    Exercises     Assessment/Plan    PT Assessment Patent does not need any further PT services  PT Problem List         PT Treatment Interventions      PT Goals (Current goals can be found in the Care Plan section)  Acute Rehab PT Goals PT Goal Formulation: All assessment and education complete, DC therapy    Frequency     Barriers to discharge  Co-evaluation               AM-PAC PT "6 Clicks" Mobility  Outcome Measure Help needed turning from your back to your side while in a flat bed without using bedrails?: None Help needed moving from lying on your back to sitting on the side of a flat bed without using bedrails?: None Help needed moving to and from a bed to a chair (including a wheelchair)?: None Help needed standing up from a chair using your arms (e.g., wheelchair or bedside chair)?: None Help needed to walk in hospital room?: None Help needed climbing 3-5 steps with a railing? : None 6 Click Score: 24    End of Session   Activity Tolerance: Other (comment) (limited by dyspnea) Patient left: in bed;with call bell/phone within reach (sitting  EOB)   PT Visit Diagnosis: Other abnormalities of gait and mobility (R26.89)    Time: 1451-1500 PT Time Calculation (min) (ACUTE ONLY): 9 min   Charges:   PT Evaluation $PT Eval Low Complexity: 1 Low          [place] Pager [phone] Office [place] 09/17/2020, 4:37 PM

## 2020-09-17 NOTE — Progress Notes (Signed)
Subjective:  Patient denies any chest pain.  Breathing has improved after receiving extra dose of IV Lasix.  Noted to have a low-grade fever.  Workup as per primary team.  Patient denies any cough, fever, sore throat, chills.  Denies any urinary complaints.  Denies abdominal pain. Noted to be hypokalemic. K is being replaced  Objective:  Vital Signs in the last 24 hours: Temp:  [97.9 F (36.6 C)-100.4 F (38 C)] 98.4 F (36.9 C) (01/27 1241) Pulse Rate:  [65-88] 65 (01/27 1241) Resp:  [18-23] 18 (01/27 1241) BP: (137-197)/(61-90) 154/61 (01/27 1241) SpO2:  [94 %-99 %] 96 % (01/27 1241) Weight:  [81.8 kg] 81.8 kg (01/27 0455)  Intake/Output from previous day: 01/26 0701 - 01/27 0700 In: 816.4 [P.O.:720; IV Piggyback:96.4] Out: 2400 [Urine:2400] Intake/Output from this shift: Total I/O In: 503 [P.O.:500; I.V.:3] Out: 950 [Urine:950]  Physical Exam: Neck: no adenopathy, no carotid bruit, no JVD and supple, symmetrical, trachea midline Lungs: decreased breath sounds at bases with faint rales Heart: regular rate and rhythm, S1, S2 normal and 2/6 systolic/diastolic murmur and S3 gallop noted Abdomen: soft, non-tender; bowel sounds normal; no masses,  no organomegaly Extremities: no clubbing, cyanosis, 1+ edema noted  Lab Results: Recent Labs    09/16/20 0759  WBC 5.7  HGB 10.0*  PLT 262   Recent Labs    09/16/20 0759 09/17/20 0159  NA 142 143  K 3.1* 2.9*  CL 107 110  CO2 23 22  GLUCOSE 135* 128*  BUN 35* 32*  CREATININE 3.51* 3.60*   No results for input(s): TROPONINI in the last 72 hours.  Invalid input(s): CK, MB Hepatic Function Panel Recent Labs    09/16/20 0956  PROT 7.0  ALBUMIN 3.4*  AST 14*  ALT 9  ALKPHOS 71  BILITOT 0.6  BILIDIR <0.1  IBILI NOT CALCULATED   No results for input(s): CHOL in the last 72 hours. No results for input(s): PROTIME in the last 72 hours.  Imaging: Imaging results have been reviewed and DG Chest 2 View  Result Date:  09/16/2020 CLINICAL DATA:  Shortness of breath EXAM: CHEST - 2 VIEW COMPARISON:  February 23, 2020 FINDINGS: There is apparent scarring in the right upper lobe, stable. There is no edema or airspace opacity. There is cardiomegaly with pulmonary vascularity normal. No adenopathy. There is aortic atherosclerosis. No bone lesions. IMPRESSION: Stable scarring right upper lobe. No edema or airspace opacity. Stable cardiomegaly. Aortic Atherosclerosis (ICD10-I70.0). Electronically Signed   By: Lowella Grip III M.D.   On: 09/16/2020 08:09   CT CHEST WO CONTRAST  Result Date: 09/17/2020 CLINICAL DATA:  86 year old male with cough and shortness of breath. EXAM: CT CHEST WITHOUT CONTRAST TECHNIQUE: Multidetector CT imaging of the chest was performed following the standard protocol without IV contrast. COMPARISON:  Chest CT dated 11/03/2016. FINDINGS: Evaluation of this exam is limited in the absence of intravenous contrast. Cardiovascular: There is mild cardiomegaly. Small pericardial effusion. Three-vessel coronary vascular calcification. There is advanced atherosclerotic calcification of the thoracic aorta. No aneurysmal dilatation. The central pulmonary arteries are grossly unremarkable. Mediastinum/Nodes: No hilar or mediastinal adenopathy. The esophagus and the thyroid gland are grossly unremarkable. No mediastinal fluid collection. Lungs/Pleura: Trace bilateral pleural effusions. There is diffuse interstitial and interlobular septal prominence with diffuse ground-glass opacity throughout the lungs most consistent with edema. Pneumonia is not excluded clinical correlation is recommended. There is background of emphysema. There is a 2.4 x 1.8 cm right upper lobe subpleural nodule with irregular margins.  This nodule has increased in size compared to prior study of 2018. Multidisciplinary consult and further evaluation with PET-CT is recommended. There is no pneumothorax. The central airways are patent. Upper Abdomen:  There is bilateral adrenal thickening/hyperplasia. Musculoskeletal: Mild diffuse subcutaneous stranding and edema. Osteopenia with degenerative changes of the spine. No acute osseous pathology. IMPRESSION: 1. Cardiomegaly with findings of CHF and trace bilateral pleural effusions. 2. Interval increase in the size of the right upper lobe subpleural nodule compared to the prior study. PET CT is recommended for further evaluation. 3. Aortic Atherosclerosis (ICD10-I70.0) and Emphysema (ICD10-J43.9). Electronically Signed   By: Anner Crete M.D.   On: 09/17/2020 16:01   DG Chest Port 1 View  Result Date: 09/17/2020 CLINICAL DATA:  Congestive heart failure EXAM: PORTABLE CHEST 1 VIEW COMPARISON:  09/16/2020 FINDINGS: The lungs are symmetrically well expanded. The lungs are clear. No pneumothorax or pleural effusion. Cardiac size is mildly enlarged, unchanged. Mild central pulmonary vascular congestion is again seen without overt pulmonary edema. No acute bone abnormality. IMPRESSION: Stable cardiomegaly with central pulmonary vascular congestion. No overt pulmonary edema. Electronically Signed   By: Fidela Salisbury MD   On: 09/17/2020 06:20    Cardiac Studies:  Assessment/Plan:  Resolving Acute on chronic decompensated congestive heart failure secondary to preserved LV systolic function secondary to noncompliance.to  Diet and fluid intake Minimally elevated high sensitivity troponin I secondary to demand ischemia/CHF/renal insufficiency Hypertensive heart disease with diastolic dysfunction. Valvular heart disease Hypertension. Diabetes mellitus. Hyperlipidemia. Cerebrovascular disease, history of PTA the remote past, internal carotid artery. Chronic kidney disease stage IV. Anemia of chronic disease.    History of carcinoma of the prostate. GERD. Hypokalemia.  low-grade fever, question source. Plan Restart torsemide 20 mg twice daily. Replace K Check labs in a.m.   LOS: 1 day    Charolette Forward 09/17/2020, 4:27 PM

## 2020-09-18 ENCOUNTER — Inpatient Hospital Stay (HOSPITAL_COMMUNITY): Payer: Medicare PPO

## 2020-09-18 DIAGNOSIS — N289 Disorder of kidney and ureter, unspecified: Secondary | ICD-10-CM

## 2020-09-18 DIAGNOSIS — J9601 Acute respiratory failure with hypoxia: Secondary | ICD-10-CM

## 2020-09-18 DIAGNOSIS — R918 Other nonspecific abnormal finding of lung field: Secondary | ICD-10-CM

## 2020-09-18 DIAGNOSIS — R7989 Other specified abnormal findings of blood chemistry: Secondary | ICD-10-CM | POA: Diagnosis not present

## 2020-09-18 LAB — BASIC METABOLIC PANEL
Anion gap: 11 (ref 5–15)
BUN: 33 mg/dL — ABNORMAL HIGH (ref 8–23)
CO2: 23 mmol/L (ref 22–32)
Calcium: 9 mg/dL (ref 8.9–10.3)
Chloride: 110 mmol/L (ref 98–111)
Creatinine, Ser: 3.7 mg/dL — ABNORMAL HIGH (ref 0.61–1.24)
GFR, Estimated: 15 mL/min — ABNORMAL LOW (ref >60)
Glucose, Bld: 131 mg/dL — ABNORMAL HIGH (ref 70–99)
Potassium: 3.4 mmol/L — ABNORMAL LOW (ref 3.5–5.1)
Sodium: 144 mmol/L (ref 135–145)

## 2020-09-18 LAB — URINE CULTURE: Culture: <10000 — AB

## 2020-09-18 LAB — GLUCOSE, CAPILLARY
Glucose-Capillary: 118 mg/dL — ABNORMAL HIGH (ref 70–99)
Glucose-Capillary: 166 mg/dL — ABNORMAL HIGH (ref 70–99)
Glucose-Capillary: 134 mg/dL — ABNORMAL HIGH (ref 70–99)
Glucose-Capillary: 164 mg/dL — ABNORMAL HIGH (ref 70–99)

## 2020-09-18 MED ORDER — MENTHOL 3 MG MT LOZG
1.0000 | LOZENGE | OROMUCOSAL | Status: DC | PRN
  Administered 2020-09-18 – 2020-09-19 (×3): 3 mg via ORAL
  Filled 2020-09-18 (×2): qty 9

## 2020-09-18 MED ORDER — METOLAZONE 2.5 MG PO TABS
2.5000 mg | ORAL_TABLET | Freq: Every day | ORAL | Status: DC
  Administered 2020-09-18 – 2020-09-19 (×2): 2.5 mg via ORAL
  Filled 2020-09-18 (×2): qty 1

## 2020-09-18 NOTE — Progress Notes (Signed)
  Mobility Specialist Criteria Algorithm Info.   Mobility Team: [facility] elevated:Self regulated Activity: Ambulated in hall; Dangled on edge of bed Range of motion: Active; All extremities Level of assistance: Independent Assistive device: None Minutes sitting in chair:  Minutes stood: 5 minutes Minutes ambulated: 5 minutes Distance ambulated (ft): 480 ft Mobility response: Tolerated well Bed Position: Chair  Pt ambulated in hallway 480 feet independently with steady slow steady gait. Tolerated ambulation well without any complaints and is now dangling EOB with all needs met.   09/18/2020 2:21 PM

## 2020-09-18 NOTE — Progress Notes (Signed)
Patient ID: Austin [name], male   DOB: 1935/02/07, 86 y.o.   MRN: [id number]  PROGRESS NOTE    Austin [name]  GYB:[id number] DOB: 06-19-1935 DOA: 09/16/2020 PCP: Dorothyann Peng, NP    Brief Narrative:   Austin [name] is a 86 y.o.malewith medical history significant ofrefractory HTN, chronic diastolic CHF,IIDM,CKD stage IV, HLD, presented with shortness of breath for several days. In ED found to have a BP of 202/88 and pulmonary edema and admitted for diuresis.   Assessment & Plan:   Principal Problem:   Acute diastolic CHF (congestive heart failure) (HCC) Active Problems:   Diabetes mellitus with renal complications (HCC)   PROSTATE CANCER, HX OF   Hypertensive urgency   CKD (chronic kidney disease), stage IV (HCC)   Mass of right lung   Acute diastolic CHF (congestive heart failure) - breathing better and pedal edema resolved - suspect high BP contributed to his fluid overload- he also admits to being poorly complaint with his diet over the past week as it was his birthday and he spent 3 days in the [place] due to the threat of inclement weather and was eating there the whole time. ABG reveals hypoxemia (on room air).  CT scan w/o contrast reveals findings consistent with pulmonary edema - will start IV Lasix at 80 mg BID and follow Zaroxolyn added by cardiology today Difficulty in adequate diuresis and balancing chronic kidney disease stage IV Chronic renal insufficiency precludes CT with contrast Patient has elevated D-dimer and findings only exam would preclude a good VQ scan as well.  He has negative bilateral Dopplers     Hypertensive urgency with elevated troponin and CHF - he states his BP has never been well controlled since he was diagnosed in his 11s - will double his Toprol and his Hydralazine-   - cont Cozaar and Isosorbide - will need to watch overnight and see if BP remains controlled on current doses  Fever - 100.4 this AM - CXR unrevealing - f/u urine and  blood cultures which are negative to date - Q4 hr vitals ordered to follow for further temps  New right upper lobe lung mass  -2.4 x 1.8 cm with irregular margins bigger since 2018 -Discussed with pulmonary today who recommend outpatient work-up this has been included in his discharge follow-up  Hypokalemia - severe @ 2.9 up to 3.4 today - due to Lasix - replete and follow - Mg is normal    CKD (chronic kidney disease), stage IV   - Cr rising due to diuretics - 3.7 today, baseline 2.9-3.1    Diabetes mellitus with renal complications   - cont SSI  Constipation -Lactulose  DVT prophylaxis: Heparin SQ Code Status: Full code  Family Communication: Patient at bedside Disposition Plan: Home  Patient remains inpatient due to continued titration of medication to improve his respiratory status.   Consultants:   Cardiology  Procedures:  None  Antimicrobials: Anti-infectives (From admission, onward)   None       Subjective: Feels okay today reports worsening constipation.  His breathing does feel better.  Objective: Vitals:   09/18/20 0543 09/18/20 0806 09/18/20 1136 09/18/20 1631  BP: (!) 183/76 (!) 191/72 (!) 154/62 (!) 181/74  Pulse: 66 79 65 83  Resp: 20  16   Temp: 98.7 F (37.1 C)  98 F (36.7 C)   TempSrc: Oral     SpO2: 92% 92% 94% 91%  Weight: 81.9 kg     Height: 5\' 7"  (1.702 m)  Intake/Output Summary (Last 24 hours) at 09/18/2020 1930 Last data filed at 09/18/2020 1623 Gross per 24 hour  Intake 600 ml  Output 2375 ml  Net -1775 ml   Filed Weights   09/16/20 1516 09/17/20 0455 09/18/20 0543  Weight: 83.7 kg 81.8 kg 81.9 kg    Examination:  General exam: Appears calm and comfortable  Respiratory system: Clear to auscultation. Respiratory effort normal. Cardiovascular system: S1 & S2 heard, RRR.  Gastrointestinal system: Abdomen is nondistended, soft and nontender.  Central nervous system: Alert and oriented. No focal neurological  deficits. Extremities: Symmetric  Skin: No rashes Psychiatry: Judgement and insight appear normal. Mood & affect appropriate.     Data Reviewed: I have personally reviewed following labs and imaging studies  CBC: Recent Labs  Lab 09/16/20 0759  WBC 5.7  HGB 10.0*  HCT 32.0*  MCV 84.7  PLT 528   Basic Metabolic Panel: Recent Labs  Lab 09/16/20 0759 09/16/20 1604 09/17/20 0159 09/18/20 0527  NA 142  --  143 144  K 3.1*  --  2.9* 3.4*  CL 107  --  110 110  CO2 23  --  22 23  GLUCOSE 135*  --  128* 131*  BUN 35*  --  32* 33*  CREATININE 3.51*  --  3.60* 3.70*  CALCIUM 8.7*  --  8.6* 9.0  MG  --  1.9 1.9  --    GFR: Estimated Creatinine Clearance: 15.2 mL/min (A) (by C-G formula based on SCr of 3.7 mg/dL (H)). Liver Function Tests: Recent Labs  Lab 09/16/20 0956  AST 14*  ALT 9  ALKPHOS 71  BILITOT 0.6  PROT 7.0  ALBUMIN 3.4*   Recent Labs  Lab 09/16/20 0956  LIPASE 27   No results for input(s): AMMONIA in the last 168 hours. Coagulation Profile: No results for input(s): INR, PROTIME in the last 168 hours. Cardiac Enzymes: No results for input(s): CKTOTAL, CKMB, CKMBINDEX, TROPONINI in the last 168 hours. BNP (last 3 results) No results for input(s): PROBNP in the last 8760 hours. HbA1C: Recent Labs    09/16/20 1600  HGBA1C 6.8*   CBG: Recent Labs  Lab 09/17/20 1705 09/17/20 2215 09/18/20 0558 09/18/20 1137 09/18/20 1606  GLUCAP 139* 139* 118* 166* 134*   Lipid Profile: No results for input(s): CHOL, HDL, LDLCALC, TRIG, CHOLHDL, LDLDIRECT in the last 72 hours. Thyroid Function Tests: No results for input(s): TSH, T4TOTAL, FREET4, T3FREE, THYROIDAB in the last 72 hours. Anemia Panel: No results for input(s): VITAMINB12, FOLATE, FERRITIN, TIBC, IRON, RETICCTPCT in the last 72 hours. Sepsis Labs: No results for input(s): PROCALCITON, LATICACIDVEN in the last 168 hours.  Recent Results (from the past 240 hour(s))  SARS Coronavirus 2 by RT  PCR (hospital order, performed in [facility] hospital lab) Nasopharyngeal Nasopharyngeal Swab     Status: None   Collection Time: 09/16/20  7:57 AM   Specimen: Nasopharyngeal Swab  Result Value Ref Range Status   SARS Coronavirus 2 NEGATIVE NEGATIVE Final    Comment: (NOTE) SARS-CoV-2 target nucleic acids are NOT DETECTED.  The SARS-CoV-2 RNA is generally detectable in upper and lower respiratory specimens during the acute phase of infection. The lowest concentration of SARS-CoV-2 viral copies this assay can detect is 250 copies / mL. A negative result does not preclude SARS-CoV-2 infection and should not be used as the sole basis for treatment or other patient management decisions.  A negative result may occur with improper specimen collection / handling, submission of  specimen other than nasopharyngeal swab, presence of viral mutation(s) within the areas targeted by this assay, and inadequate number of viral copies (<250 copies / mL). A negative result must be combined with clinical observations, patient history, and epidemiological information.  Fact Sheet for Patients:   StrictlyIdeas.no  Fact Sheet for Healthcare Providers: BankingDealers.co.za  This test is not yet approved or  cleared by the [place] FDA and has been authorized for detection and/or diagnosis of SARS-CoV-2 by FDA under an Emergency Use Authorization (EUA).  This EUA will remain in effect (meaning this test can be used) for the duration of the COVID-19 declaration under Section 564(b)(1) of the Act, 21 U.S.C. section 360bbb-3(b)(1), unless the authorization is terminated or revoked sooner.  Performed at [facility], [place] [address] [place], [place] [address]   Culture, Urine     Status: Abnormal   Collection Time: 09/17/20  7:46 AM   Specimen: Urine, Random  Result Value Ref Range Status   Specimen Description URINE, RANDOM  Final   Special  Requests NONE  Final   Culture (A)  Final    <10,000 COLONIES/mL INSIGNIFICANT GROWTH Performed at [facility], [place] [address] [place], [place] [address]    Report Status 09/18/2020 FINAL  Final  Culture, blood (Routine X 2) w Reflex to ID Panel     Status: None (Preliminary result)   Collection Time: 09/17/20  8:40 AM   Specimen: BLOOD  Result Value Ref Range Status   Specimen Description BLOOD LEFT ANTECUBITAL  Final   Special Requests   Final    BOTTLES DRAWN AEROBIC AND ANAEROBIC Blood Culture results may not be optimal due to an inadequate volume of blood received in culture bottles   Culture   Final    NO GROWTH 1 DAY Performed at [facility], [place] [address] [place], [place] [address]    Report Status PENDING  Incomplete  Culture, blood (Routine X 2) w Reflex to ID Panel     Status: None (Preliminary result)   Collection Time: 09/17/20  8:40 AM   Specimen: BLOOD  Result Value Ref Range Status   Specimen Description BLOOD RIGHT ANTECUBITAL  Final   Special Requests   Final    BOTTLES DRAWN AEROBIC AND ANAEROBIC Blood Culture results may not be optimal due to an inadequate volume of blood received in culture bottles   Culture   Final    NO GROWTH 1 DAY Performed at [facility], [place] [address] [place], [place] [address]    Report Status PENDING  Incomplete      Radiology Studies: CT CHEST WO CONTRAST  Result Date: 09/17/2020 CLINICAL DATA:  85 year old male with cough and shortness of breath. EXAM: CT CHEST WITHOUT CONTRAST TECHNIQUE: Multidetector CT imaging of the chest was performed following the standard protocol without IV contrast. COMPARISON:  Chest CT dated 11/03/2016. FINDINGS: Evaluation of this exam is limited in the absence of intravenous contrast. Cardiovascular: There is mild cardiomegaly. Small pericardial effusion. Three-vessel coronary vascular calcification. There is advanced atherosclerotic calcification of the thoracic aorta. No  aneurysmal dilatation. The central pulmonary arteries are grossly unremarkable. Mediastinum/Nodes: No hilar or mediastinal adenopathy. The esophagus and the thyroid gland are grossly unremarkable. No mediastinal fluid collection. Lungs/Pleura: Trace bilateral pleural effusions. There is diffuse interstitial and interlobular septal prominence with diffuse ground-glass opacity throughout the lungs most consistent with edema. Pneumonia is not excluded clinical correlation is recommended. There is background of emphysema.  There is a 2.4 x 1.8 cm right upper lobe subpleural nodule with irregular margins. This nodule has increased in size compared to prior study of 2018. Multidisciplinary consult and further evaluation with PET-CT is recommended. There is no pneumothorax. The central airways are patent. Upper Abdomen: There is bilateral adrenal thickening/hyperplasia. Musculoskeletal: Mild diffuse subcutaneous stranding and edema. Osteopenia with degenerative changes of the spine. No acute osseous pathology. IMPRESSION: 1. Cardiomegaly with findings of CHF and trace bilateral pleural effusions. 2. Interval increase in the size of the right upper lobe subpleural nodule compared to the prior study. PET CT is recommended for further evaluation. 3. Aortic Atherosclerosis (ICD10-I70.0) and Emphysema (ICD10-J43.9). Electronically Signed   By: Anner Crete M.D.   On: 09/17/2020 16:01   DG Chest Port 1 View  Result Date: 09/17/2020 CLINICAL DATA:  Congestive heart failure EXAM: PORTABLE CHEST 1 VIEW COMPARISON:  09/16/2020 FINDINGS: The lungs are symmetrically well expanded. The lungs are clear. No pneumothorax or pleural effusion. Cardiac size is mildly enlarged, unchanged. Mild central pulmonary vascular congestion is again seen without overt pulmonary edema. No acute bone abnormality. IMPRESSION: Stable cardiomegaly with central pulmonary vascular congestion. No overt pulmonary edema. Electronically Signed   By: Fidela Salisbury MD   On: 09/17/2020 06:20   VAS [place] LOWER EXTREMITY VENOUS (DVT)  Result Date: 09/18/2020  Lower Venous DVT Study Indications: Elevated D-dimer, hypoxia, inability to do contrasted CT due to renal insuff.  Comparison Study: no prior Performing Technologist: Abram Sander RVS  Examination Guidelines: A complete evaluation includes B-mode imaging, spectral Doppler, color Doppler, and power Doppler as needed of all accessible portions of each vessel. Bilateral testing is considered an integral part of a complete examination. Limited examinations for reoccurring indications may be performed as noted. The reflux portion of the exam is performed with the patient in reverse Trendelenburg.  +---------+---------------+---------+-----------+----------+--------------+ RIGHT    CompressibilityPhasicitySpontaneityPropertiesThrombus Aging +---------+---------------+---------+-----------+----------+--------------+ CFV      Full           Yes      Yes                                 +---------+---------------+---------+-----------+----------+--------------+ SFJ      Full                                                        +---------+---------------+---------+-----------+----------+--------------+ FV Prox  Full                                                        +---------+---------------+---------+-----------+----------+--------------+ FV Mid   Full                                                        +---------+---------------+---------+-----------+----------+--------------+ FV DistalFull                                                        +---------+---------------+---------+-----------+----------+--------------+  PFV      Full                                                        +---------+---------------+---------+-----------+----------+--------------+ POP      Full           Yes      Yes                                  +---------+---------------+---------+-----------+----------+--------------+ PTV      Full                                                        +---------+---------------+---------+-----------+----------+--------------+ PERO     Full                                                        +---------+---------------+---------+-----------+----------+--------------+   +---------+---------------+---------+-----------+----------+--------------+ LEFT     CompressibilityPhasicitySpontaneityPropertiesThrombus Aging +---------+---------------+---------+-----------+----------+--------------+ CFV      Full           Yes      Yes                                 +---------+---------------+---------+-----------+----------+--------------+ SFJ      Full                                                        +---------+---------------+---------+-----------+----------+--------------+ FV Prox  Full                                                        +---------+---------------+---------+-----------+----------+--------------+ FV Mid   Full                                                        +---------+---------------+---------+-----------+----------+--------------+ FV DistalFull                                                        +---------+---------------+---------+-----------+----------+--------------+ PFV      Full                                                        +---------+---------------+---------+-----------+----------+--------------+  POP      Full           Yes      Yes                                 +---------+---------------+---------+-----------+----------+--------------+ PTV      Full                                                        +---------+---------------+---------+-----------+----------+--------------+ PERO     Full                                                         +---------+---------------+---------+-----------+----------+--------------+     Summary: BILATERAL: - No evidence of deep vein thrombosis seen in the lower extremities, bilaterally. - No evidence of superficial venous thrombosis in the lower extremities, bilaterally. -No evidence of popliteal cyst, bilaterally.   *See table(s) above for measurements and observations. Electronically signed by Jamelle Haring on 09/18/2020 at 5:22:29 PM.    Final      Scheduled Meds: . amLODipine  10 mg Oral Daily  . atorvastatin  20 mg Oral Daily  . cloNIDine  0.3 mg Oral TID  . clopidogrel  75 mg Oral Daily  . doxazosin  2 mg Oral QHS  . furosemide  80 mg Intravenous BID  . heparin  5,000 Units Subcutaneous Q12H  . hydrALAZINE  100 mg Oral TID  . insulin aspart  0-9 Units Subcutaneous TID WC  . iron polysaccharides  150 mg Oral Daily  . isosorbide dinitrate  20 mg Oral TID  . losartan  12.5 mg Oral Daily  . metolazone  2.5 mg Oral Daily  . metoprolol succinate  100 mg Oral Daily  . pantoprazole  40 mg Oral Daily  . potassium chloride  10 mEq Oral Daily  . potassium chloride  20 mEq Oral BID  . sodium chloride flush  3 mL Intravenous Q12H  . triamcinolone  1 application Topical BID   Continuous Infusions: . sodium chloride       LOS: 2 days    Donnamae Jude, MD 09/18/2020 7:30 PM [phone] [facility] If 7PM-7AM, please contact night-coverage 09/18/2020, 7:30 PM

## 2020-09-18 NOTE — Progress Notes (Signed)
Subjective:  Resident.  Denies any chest pain again and difficulty breathing yesterday , noted to have minimally elevated d-dimer subsequently had CT of the chest which showed right upper lobe subpleural nodule which has increased in size as compared to prior CT from 2018.  Denies any hemoptysis.  He received IV Lasix yesterday again with improvement in his breathing. Torsemide was restarted yesterday Objective:  Vital Signs in the last 24 hours: Temp:  [98 F (36.7 C)-99.1 F (37.3 C)] 98 F (36.7 C) (01/28 1136) Pulse Rate:  [65-83] 65 (01/28 1136) Resp:  [16-20] 16 (01/28 1136) BP: (131-191)/(61-95) 154/62 (01/28 1136) SpO2:  [92 %-96 %] 94 % (01/28 1136) Weight:  [81.9 kg] 81.9 kg (01/28 0543)  Intake/Output from previous day: 01/27 0701 - 01/28 0700 In: 743 [P.O.:740; I.V.:3] Out: 2250 [Urine:2250] Intake/Output from this shift: Total I/O In: 120 [P.O.:120] Out: 1075 [Urine:1075]  Physical Exam: Neck: no adenopathy, no carotid bruit, no JVD and supple, symmetrical, trachea midline Lungs: decreased breath sounds at bases with faint rales noted Heart: regular rate and rhythm, S1, S2 normal and 2/6 systolic/diastolic murmur and S3 gallop noted Abdomen: soft, non-tender; bowel sounds normal; no masses,  no organomegaly Extremities: no clubbing, cyanosis.  Trace edema noted  Lab Results: Recent Labs    09/16/20 0759  WBC 5.7  HGB 10.0*  PLT 262   Recent Labs    09/17/20 0159 09/18/20 0527  NA 143 144  K 2.9* 3.4*  CL 110 110  CO2 22 23  GLUCOSE 128* 131*  BUN 32* 33*  CREATININE 3.60* 3.70*   No results for input(s): TROPONINI in the last 72 hours.  Invalid input(s): CK, MB Hepatic Function Panel Recent Labs    09/16/20 0956  PROT 7.0  ALBUMIN 3.4*  AST 14*  ALT 9  ALKPHOS 71  BILITOT 0.6  BILIDIR <0.1  IBILI NOT CALCULATED   No results for input(s): CHOL in the last 72 hours. No results for input(s): PROTIME in the last 72  hours.  Imaging: Imaging results have been reviewed and CT CHEST WO CONTRAST  Result Date: 09/17/2020 CLINICAL DATA:  86 year old male with cough and shortness of breath. EXAM: CT CHEST WITHOUT CONTRAST TECHNIQUE: Multidetector CT imaging of the chest was performed following the standard protocol without IV contrast. COMPARISON:  Chest CT dated 11/03/2016. FINDINGS: Evaluation of this exam is limited in the absence of intravenous contrast. Cardiovascular: There is mild cardiomegaly. Small pericardial effusion. Three-vessel coronary vascular calcification. There is advanced atherosclerotic calcification of the thoracic aorta. No aneurysmal dilatation. The central pulmonary arteries are grossly unremarkable. Mediastinum/Nodes: No hilar or mediastinal adenopathy. The esophagus and the thyroid gland are grossly unremarkable. No mediastinal fluid collection. Lungs/Pleura: Trace bilateral pleural effusions. There is diffuse interstitial and interlobular septal prominence with diffuse ground-glass opacity throughout the lungs most consistent with edema. Pneumonia is not excluded clinical correlation is recommended. There is background of emphysema. There is a 2.4 x 1.8 cm right upper lobe subpleural nodule with irregular margins. This nodule has increased in size compared to prior study of 2018. Multidisciplinary consult and further evaluation with PET-CT is recommended. There is no pneumothorax. The central airways are patent. Upper Abdomen: There is bilateral adrenal thickening/hyperplasia. Musculoskeletal: Mild diffuse subcutaneous stranding and edema. Osteopenia with degenerative changes of the spine. No acute osseous pathology. IMPRESSION: 1. Cardiomegaly with findings of CHF and trace bilateral pleural effusions. 2. Interval increase in the size of the right upper lobe subpleural nodule compared to the prior  study. PET CT is recommended for further evaluation. 3. Aortic Atherosclerosis (ICD10-I70.0) and  Emphysema (ICD10-J43.9). Electronically Signed   By: Anner Crete M.D.   On: 09/17/2020 16:01   DG Chest Port 1 View  Result Date: 09/17/2020 CLINICAL DATA:  Congestive heart failure EXAM: PORTABLE CHEST 1 VIEW COMPARISON:  09/16/2020 FINDINGS: The lungs are symmetrically well expanded. The lungs are clear. No pneumothorax or pleural effusion. Cardiac size is mildly enlarged, unchanged. Mild central pulmonary vascular congestion is again seen without overt pulmonary edema. No acute bone abnormality. IMPRESSION: Stable cardiomegaly with central pulmonary vascular congestion. No overt pulmonary edema. Electronically Signed   By: Fidela Salisbury MD   On: 09/17/2020 06:20    Cardiac Studies:  Assessment/Plan:  Resolving Acute on chronic decompensated congestive heart failure secondary to preserved LV systolic function secondary to noncompliance.to Diet and fluid intake Minimally elevated high sensitivity troponin I secondary to demand ischemia/CHF/renal insufficiency Hypertensive heart disease with diastolic dysfunction. Valvular heart disease Right lung nodule Hypertension. Diabetes mellitus. Hyperlipidemia. Cerebrovascular disease, history of PTA the remote past, internal carotid artery. Chronic kidney disease stage IV. Anemia of chronic disease.  History of carcinoma of the prostate. GERD. Hypokalemia. Plan Add low-dose Zaroxolyn 2.5 mg daily. Monitor renal function Consider pulmonary consult.   LOS: 2 days    Charolette Forward 09/18/2020, 1:12 PM

## 2020-09-18 NOTE — Progress Notes (Signed)
Lower extremity venous has been completed.   Preliminary results in CV Proc.   Abram Sander 09/18/2020 4:18 PM

## 2020-09-18 NOTE — Plan of Care (Signed)
  Problem: Education: Goal: Knowledge of General Education information will improve Description: Including pain rating scale, medication(s)/side effects and non-pharmacologic comfort measures Outcome: Progressing   Problem: Health Behavior/Discharge Planning: Goal: Ability to manage health-related needs will improve Outcome: Progressing   Problem: Activity: Goal: Risk for activity intolerance will decrease Outcome: Progressing   

## 2020-09-19 DIAGNOSIS — I5031 Acute diastolic (congestive) heart failure: Secondary | ICD-10-CM

## 2020-09-19 LAB — BASIC METABOLIC PANEL
Anion gap: 11 (ref 5–15)
BUN: 39 mg/dL — ABNORMAL HIGH (ref 8–23)
CO2: 23 mmol/L (ref 22–32)
Calcium: 9.3 mg/dL (ref 8.9–10.3)
Chloride: 110 mmol/L (ref 98–111)
Creatinine, Ser: 3.94 mg/dL — ABNORMAL HIGH (ref 0.61–1.24)
GFR, Estimated: 14 mL/min — ABNORMAL LOW (ref >60)
Glucose, Bld: 139 mg/dL — ABNORMAL HIGH (ref 70–99)
Potassium: 3.3 mmol/L — ABNORMAL LOW (ref 3.5–5.1)
Sodium: 144 mmol/L (ref 135–145)

## 2020-09-19 LAB — GLUCOSE, CAPILLARY
Glucose-Capillary: 128 mg/dL — ABNORMAL HIGH (ref 70–99)
Glucose-Capillary: 143 mg/dL — ABNORMAL HIGH (ref 70–99)
Glucose-Capillary: 198 mg/dL — ABNORMAL HIGH (ref 70–99)
Glucose-Capillary: 122 mg/dL — ABNORMAL HIGH (ref 70–99)

## 2020-09-19 MED ORDER — SENNOSIDES-DOCUSATE SODIUM 8.6-50 MG PO TABS
1.0000 | ORAL_TABLET | Freq: Every day | ORAL | Status: DC
  Administered 2020-09-19 – 2020-09-20 (×2): 1 via ORAL
  Filled 2020-09-19 (×2): qty 1

## 2020-09-19 MED ORDER — TORSEMIDE 20 MG PO TABS
20.0000 mg | ORAL_TABLET | Freq: Two times a day (BID) | ORAL | Status: DC
  Administered 2020-09-19 – 2020-09-20 (×2): 20 mg via ORAL
  Filled 2020-09-19 (×2): qty 1

## 2020-09-19 NOTE — Progress Notes (Signed)
Subjective:  Doing well denies any chest pain or shortness of breath.  Complains of constipation.  Work-up for right lung nodule and will be done as outpatient as per pulmonary.  Objective:  Vital Signs in the last 24 hours: Temp:  [98 F (36.7 C)-98.8 F (37.1 C)] 98.8 F (37.1 C) (01/29 0525) Pulse Rate:  [63-83] 68 (01/29 0909) Resp:  [16-18] 18 (01/29 0525) BP: (154-188)/(62-74) 188/72 (01/29 0909) SpO2:  [91 %-97 %] 97 % (01/29 0525) Weight:  [79.4 kg] 79.4 kg (01/29 0101)  Intake/Output from previous day: 01/28 0701 - 01/29 0700 In: 600 [P.O.:600] Out: 3275 [Urine:3275] Intake/Output from this shift: Total I/O In: 240 [P.O.:240] Out: 750 [Urine:750]  Physical Exam: Neck: no adenopathy, no carotid bruit, no JVD and supple, symmetrical, trachea midline Lungs: clear to auscultation bilaterally Heart: regular rate and rhythm, S1, S2 normal and 2/6 systolic and diastolic murmur noted Abdomen: soft, non-tender; bowel sounds normal; no masses,  no organomegaly Extremities: extremities normal, atraumatic, no cyanosis or edema  Lab Results: No results for input(s): WBC, HGB, PLT in the last 72 hours. Recent Labs    09/18/20 0527 09/19/20 0501  NA 144 144  K 3.4* 3.3*  CL 110 110  CO2 23 23  GLUCOSE 131* 139*  BUN 33* 39*  CREATININE 3.70* 3.94*   No results for input(s): TROPONINI in the last 72 hours.  Invalid input(s): CK, MB Hepatic Function Panel No results for input(s): PROT, ALBUMIN, AST, ALT, ALKPHOS, BILITOT, BILIDIR, IBILI in the last 72 hours. No results for input(s): CHOL in the last 72 hours. No results for input(s): PROTIME in the last 72 hours.  Imaging: Imaging results have been reviewed and CT CHEST WO CONTRAST  Result Date: 09/17/2020 CLINICAL DATA:  86 year old male with cough and shortness of breath. EXAM: CT CHEST WITHOUT CONTRAST TECHNIQUE: Multidetector CT imaging of the chest was performed following the standard protocol without IV contrast.  COMPARISON:  Chest CT dated 11/03/2016. FINDINGS: Evaluation of this exam is limited in the absence of intravenous contrast. Cardiovascular: There is mild cardiomegaly. Small pericardial effusion. Three-vessel coronary vascular calcification. There is advanced atherosclerotic calcification of the thoracic aorta. No aneurysmal dilatation. The central pulmonary arteries are grossly unremarkable. Mediastinum/Nodes: No hilar or mediastinal adenopathy. The esophagus and the thyroid gland are grossly unremarkable. No mediastinal fluid collection. Lungs/Pleura: Trace bilateral pleural effusions. There is diffuse interstitial and interlobular septal prominence with diffuse ground-glass opacity throughout the lungs most consistent with edema. Pneumonia is not excluded clinical correlation is recommended. There is background of emphysema. There is a 2.4 x 1.8 cm right upper lobe subpleural nodule with irregular margins. This nodule has increased in size compared to prior study of 2018. Multidisciplinary consult and further evaluation with PET-CT is recommended. There is no pneumothorax. The central airways are patent. Upper Abdomen: There is bilateral adrenal thickening/hyperplasia. Musculoskeletal: Mild diffuse subcutaneous stranding and edema. Osteopenia with degenerative changes of the spine. No acute osseous pathology. IMPRESSION: 1. Cardiomegaly with findings of CHF and trace bilateral pleural effusions. 2. Interval increase in the size of the right upper lobe subpleural nodule compared to the prior study. PET CT is recommended for further evaluation. 3. Aortic Atherosclerosis (ICD10-I70.0) and Emphysema (ICD10-J43.9). Electronically Signed   By: Anner Crete M.D.   On: 09/17/2020 16:01   VAS [place] LOWER EXTREMITY VENOUS (DVT)  Result Date: 09/18/2020  Lower Venous DVT Study Indications: Elevated D-dimer, hypoxia, inability to do contrasted CT due to renal insuff.  Comparison Study: no  prior Performing Technologist:  Abram Sander RVS  Examination Guidelines: A complete evaluation includes B-mode imaging, spectral Doppler, color Doppler, and power Doppler as needed of all accessible portions of each vessel. Bilateral testing is considered an integral part of a complete examination. Limited examinations for reoccurring indications may be performed as noted. The reflux portion of the exam is performed with the patient in reverse Trendelenburg.  +---------+---------------+---------+-----------+----------+--------------+ RIGHT    CompressibilityPhasicitySpontaneityPropertiesThrombus Aging +---------+---------------+---------+-----------+----------+--------------+ CFV      Full           Yes      Yes                                 +---------+---------------+---------+-----------+----------+--------------+ SFJ      Full                                                        +---------+---------------+---------+-----------+----------+--------------+ FV Prox  Full                                                        +---------+---------------+---------+-----------+----------+--------------+ FV Mid   Full                                                        +---------+---------------+---------+-----------+----------+--------------+ FV DistalFull                                                        +---------+---------------+---------+-----------+----------+--------------+ PFV      Full                                                        +---------+---------------+---------+-----------+----------+--------------+ POP      Full           Yes      Yes                                 +---------+---------------+---------+-----------+----------+--------------+ PTV      Full                                                        +---------+---------------+---------+-----------+----------+--------------+ PERO     Full                                                         +---------+---------------+---------+-----------+----------+--------------+   +---------+---------------+---------+-----------+----------+--------------+  LEFT     CompressibilityPhasicitySpontaneityPropertiesThrombus Aging +---------+---------------+---------+-----------+----------+--------------+ CFV      Full           Yes      Yes                                 +---------+---------------+---------+-----------+----------+--------------+ SFJ      Full                                                        +---------+---------------+---------+-----------+----------+--------------+ FV Prox  Full                                                        +---------+---------------+---------+-----------+----------+--------------+ FV Mid   Full                                                        +---------+---------------+---------+-----------+----------+--------------+ FV DistalFull                                                        +---------+---------------+---------+-----------+----------+--------------+ PFV      Full                                                        +---------+---------------+---------+-----------+----------+--------------+ POP      Full           Yes      Yes                                 +---------+---------------+---------+-----------+----------+--------------+ PTV      Full                                                        +---------+---------------+---------+-----------+----------+--------------+ PERO     Full                                                        +---------+---------------+---------+-----------+----------+--------------+     Summary: BILATERAL: - No evidence of deep vein thrombosis seen in the lower extremities, bilaterally. - No evidence of superficial venous thrombosis in the lower extremities, bilaterally. -No evidence of popliteal cyst, bilaterally.   *See table(s) above for  measurements and observations. Electronically  signed by Jamelle Haring on 09/18/2020 at 5:22:29 PM.    Final     Cardiac Studies:  Assessment/Plan:  Compensated congestive heart failure secondary to preserved LV systolic function secondary to noncompliance.to Diet and fluid intake Minimally elevated high sensitivity troponin I secondary to demand ischemia/CHF/renal insufficiency Hypertensive heart disease with diastolic dysfunction. Valvular heart disease Right lung nodule Hypertension. Diabetes mellitus. Hyperlipidemia. Cerebrovascular disease, history of PTA the remote past, internal carotid artery. Chronic kidney disease stage IV. Anemia of chronic disease.  History of carcinoma of the prostate. GERD. Hypokalemia. Plan Okay to switch IV Lasix to torsemide 20 mg twice daily and continue Zaroxolyn 2.5 mg daily Agree with increasing hydralazine as per orders Rx for constipation Okay to discharge from cardiac point of view Follow-up with me in 1 week and pulmonary and renal service as outpatient as scheduled  LOS: 3 days    Charolette Forward 09/19/2020, 11:21 AM

## 2020-09-19 NOTE — Plan of Care (Signed)
  Problem: Health Behavior/Discharge Planning: Goal: Ability to manage health-related needs will improve Outcome: Adequate for Discharge   

## 2020-09-19 NOTE — Progress Notes (Signed)
PROGRESS NOTE    Kalyan Barabas    Code Status: Full Code  RWE:[id number] DOB: 02/28/1935 DOA: 09/16/2020 LOS: 3 days  PCP: Dorothyann Peng, NP CC:  Chief Complaint  Patient presents with  . Shortness of Breath       Hospital Summary   This is an 86 year old male with past medical history of hypertension, chronic diastolic CHF, type 2 diabetes, CKD 4, hyperlipidemia presented to the ED with several days of shortness of breath.  Found to have BP 202/88 and pulmonary edema and admitted for acute diastolic heart failure exacerbation.  Cardiology has been consulted and patient has undergone diuresis.    A & P   Principal Problem:   Acute diastolic CHF (congestive heart failure) (HCC) Active Problems:   Diabetes mellitus with renal complications (HCC)   PROSTATE CANCER, HX OF   Hypertensive urgency   CKD (chronic kidney disease), stage IV (HCC)   Mass of right lung   1. Acute hypoxemic respiratory failure secondary to acute on chronic diastolic heart failure secondary to noncompliance to diet and fluid intake, resolved a. Initial ABG with pO2 52 on room air b. Currently on room air and volume status improved c. IV Lasix switched to torsemide 20 mg p.o. daily today and continuing Zaroxolyn 2.5 mg daily per cardiology  2. Hypertensive urgency with elevated troponin and CHF a. Toprol and hydralazine increased b. On Cozaar, amlodipine, clonidine, doxazosin and isosorbide   3. CKD 4 with elevated creatinine from baseline a. Creatinine continues to increase, 3.51 admission and currently 3.9, baseline about 3.0 b. has been getting diuretics and losartan c. If creatinine remains stable overnight then hopeful discharge tomorrow d. will likely need referral to nephrology at discharge  4. Fever of unclear etiology, resolved  5. New right upper lobe lung mass a. 2.4 x 1.8 cm with irregular margins, bigger than 2018 b. Hospitalist discussed with pulmonology who recommended outpatient  work-up   6. Hypokalemia a. Continue potassium supplement  7. Diabetes with renal complications a. Continue sliding scale  8. Constipation a. No BM with lactulose b. Add on senna   DVT prophylaxis: heparin injection 5,000 Units Start: 09/16/20 1400   Family Communication: Patient updated  Disposition Plan:  Status is: Inpatient  Remains inpatient appropriate because:Trending renal function.  Hopeful discharge tomorrow if it remains stable   Dispo: The patient is from: Home              Anticipated d/c is to: Home              Anticipated d/c date is: 1 day              Patient currently is not medically stable to d/c.   Difficult to place patient No           Pressure injury documentation    None  Consultants  Cardiology   Procedures  None  Antibiotics   Anti-infectives (From admission, onward)   None        Subjective   Patient seen and examined at bedside in no acute distress and resting comfortably. No acute events overnight.  Admits to constipation. Ambulating. Tolerating diet well.   Objective   Vitals:   09/19/20 0101 09/19/20 0525 09/19/20 0909 09/19/20 1138  BP:  (!) 169/72 (!) 188/72 (!) 170/66  Pulse:  63 68 (!) 59  Resp:  18  20  Temp:  98.8 F (37.1 C)  97.9 F (36.6 C)  TempSrc:  Oral  Oral  SpO2:  97%  100%  Weight: 79.4 kg     Height:        Intake/Output Summary (Last 24 hours) at 09/19/2020 1516 Last data filed at 09/19/2020 1300 Gross per 24 hour  Intake 720 ml  Output 3300 ml  Net -2580 ml   Filed Weights   09/17/20 0455 09/18/20 0543 09/19/20 0101  Weight: 81.8 kg 81.9 kg 79.4 kg    Examination:  Physical Exam Vitals and nursing note reviewed.  Constitutional:      Appearance: Normal appearance.  HENT:     Head: Normocephalic and atraumatic.  Eyes:     Conjunctiva/sclera: Conjunctivae normal.  Cardiovascular:     Rate and Rhythm: Normal rate and regular rhythm.  Pulmonary:     Effort: Pulmonary  effort is normal.     Breath sounds: Normal breath sounds.  Abdominal:     General: Abdomen is flat.     Palpations: Abdomen is soft.  Musculoskeletal:        General: No swelling or tenderness.  Skin:    Coloration: Skin is not jaundiced or pale.  Neurological:     Mental Status: He is alert. Mental status is at baseline.  Psychiatric:        Mood and Affect: Mood normal.        Behavior: Behavior normal.     Data Reviewed: I have personally reviewed following labs and imaging studies  CBC: Recent Labs  Lab 09/16/20 0759  WBC 5.7  HGB 10.0*  HCT 32.0*  MCV 84.7  PLT 409   Basic Metabolic Panel: Recent Labs  Lab 09/16/20 0759 09/16/20 1604 09/17/20 0159 09/18/20 0527 09/19/20 0501  NA 142  --  143 144 144  K 3.1*  --  2.9* 3.4* 3.3*  CL 107  --  110 110 110  CO2 23  --  22 23 23   GLUCOSE 135*  --  128* 131* 139*  BUN 35*  --  32* 33* 39*  CREATININE 3.51*  --  3.60* 3.70* 3.94*  CALCIUM 8.7*  --  8.6* 9.0 9.3  MG  --  1.9 1.9  --   --    GFR: Estimated Creatinine Clearance: 14.1 mL/min (A) (by C-G formula based on SCr of 3.94 mg/dL (H)). Liver Function Tests: Recent Labs  Lab 09/16/20 0956  AST 14*  ALT 9  ALKPHOS 71  BILITOT 0.6  PROT 7.0  ALBUMIN 3.4*   Recent Labs  Lab 09/16/20 0956  LIPASE 27   No results for input(s): AMMONIA in the last 168 hours. Coagulation Profile: No results for input(s): INR, PROTIME in the last 168 hours. Cardiac Enzymes: No results for input(s): CKTOTAL, CKMB, CKMBINDEX, TROPONINI in the last 168 hours. BNP (last 3 results) No results for input(s): PROBNP in the last 8760 hours. HbA1C: Recent Labs    09/16/20 1600  HGBA1C 6.8*   CBG: Recent Labs  Lab 09/18/20 1137 09/18/20 1606 09/18/20 2222 09/19/20 0607 09/19/20 1102  GLUCAP 166* 134* 164* 128* 143*   Lipid Profile: No results for input(s): CHOL, HDL, LDLCALC, TRIG, CHOLHDL, LDLDIRECT in the last 72 hours. Thyroid Function Tests: No results for  input(s): TSH, T4TOTAL, FREET4, T3FREE, THYROIDAB in the last 72 hours. Anemia Panel: No results for input(s): VITAMINB12, FOLATE, FERRITIN, TIBC, IRON, RETICCTPCT in the last 72 hours. Sepsis Labs: No results for input(s): PROCALCITON, LATICACIDVEN in the last 168 hours.  Recent Results (from the past 240 hour(s))  SARS Coronavirus 2  by RT PCR (hospital order, performed in [facility] hospital lab) Nasopharyngeal Nasopharyngeal Swab     Status: None   Collection Time: 09/16/20  7:57 AM   Specimen: Nasopharyngeal Swab  Result Value Ref Range Status   SARS Coronavirus 2 NEGATIVE NEGATIVE Final    Comment: (NOTE) SARS-CoV-2 target nucleic acids are NOT DETECTED.  The SARS-CoV-2 RNA is generally detectable in upper and lower respiratory specimens during the acute phase of infection. The lowest concentration of SARS-CoV-2 viral copies this assay can detect is 250 copies / mL. A negative result does not preclude SARS-CoV-2 infection and should not be used as the sole basis for treatment or other patient management decisions.  A negative result may occur with improper specimen collection / handling, submission of specimen other than nasopharyngeal swab, presence of viral mutation(s) within the areas targeted by this assay, and inadequate number of viral copies (<250 copies / mL). A negative result must be combined with clinical observations, patient history, and epidemiological information.  Fact Sheet for Patients:   StrictlyIdeas.no  Fact Sheet for Healthcare Providers: BankingDealers.co.za  This test is not yet approved or  cleared by the [place] FDA and has been authorized for detection and/or diagnosis of SARS-CoV-2 by FDA under an Emergency Use Authorization (EUA).  This EUA will remain in effect (meaning this test can be used) for the duration of the COVID-19 declaration under Section 564(b)(1) of the Act, 21 U.S.C. section  360bbb-3(b)(1), unless the authorization is terminated or revoked sooner.  Performed at [facility], [place] [address], [place], [place] [address]   Culture, Urine     Status: Abnormal   Collection Time: 09/17/20  7:46 AM   Specimen: Urine, Random  Result Value Ref Range Status   Specimen Description URINE, RANDOM  Final   Special Requests NONE  Final   Culture (A)  Final    <10,000 COLONIES/mL INSIGNIFICANT GROWTH Performed at [facility], [place] [address] [place], [place] [address]    Report Status 09/18/2020 FINAL  Final  Culture, blood (Routine X 2) w Reflex to ID Panel     Status: None (Preliminary result)   Collection Time: 09/17/20  8:40 AM   Specimen: BLOOD  Result Value Ref Range Status   Specimen Description BLOOD LEFT ANTECUBITAL  Final   Special Requests   Final    BOTTLES DRAWN AEROBIC AND ANAEROBIC Blood Culture results may not be optimal due to an inadequate volume of blood received in culture bottles   Culture   Final    NO GROWTH 1 DAY Performed at [facility], [place] [address], [place], [place] [address]    Report Status PENDING  Incomplete  Culture, blood (Routine X 2) w Reflex to ID Panel     Status: None (Preliminary result)   Collection Time: 09/17/20  8:40 AM   Specimen: BLOOD  Result Value Ref Range Status   Specimen Description BLOOD RIGHT ANTECUBITAL  Final   Special Requests   Final    BOTTLES DRAWN AEROBIC AND ANAEROBIC Blood Culture results may not be optimal due to an inadequate volume of blood received in culture bottles   Culture   Final    NO GROWTH 1 DAY Performed at [facility], [place] [address] [place], [place] [address]    Report Status PENDING  Incomplete         Radiology Studies: CT CHEST WO CONTRAST  Result Date: 09/17/2020 CLINICAL DATA:  86 year old  male with cough and shortness of breath. EXAM: CT CHEST WITHOUT CONTRAST TECHNIQUE: Multidetector CT imaging of the chest was performed following the  standard protocol without IV contrast. COMPARISON:  Chest CT dated 11/03/2016. FINDINGS: Evaluation of this exam is limited in the absence of intravenous contrast. Cardiovascular: There is mild cardiomegaly. Small pericardial effusion. Three-vessel coronary vascular calcification. There is advanced atherosclerotic calcification of the thoracic aorta. No aneurysmal dilatation. The central pulmonary arteries are grossly unremarkable. Mediastinum/Nodes: No hilar or mediastinal adenopathy. The esophagus and the thyroid gland are grossly unremarkable. No mediastinal fluid collection. Lungs/Pleura: Trace bilateral pleural effusions. There is diffuse interstitial and interlobular septal prominence with diffuse ground-glass opacity throughout the lungs most consistent with edema. Pneumonia is not excluded clinical correlation is recommended. There is background of emphysema. There is a 2.4 x 1.8 cm right upper lobe subpleural nodule with irregular margins. This nodule has increased in size compared to prior study of 2018. Multidisciplinary consult and further evaluation with PET-CT is recommended. There is no pneumothorax. The central airways are patent. Upper Abdomen: There is bilateral adrenal thickening/hyperplasia. Musculoskeletal: Mild diffuse subcutaneous stranding and edema. Osteopenia with degenerative changes of the spine. No acute osseous pathology. IMPRESSION: 1. Cardiomegaly with findings of CHF and trace bilateral pleural effusions. 2. Interval increase in the size of the right upper lobe subpleural nodule compared to the prior study. PET CT is recommended for further evaluation. 3. Aortic Atherosclerosis (ICD10-I70.0) and Emphysema (ICD10-J43.9). Electronically Signed   By: Anner Crete M.D.   On: 09/17/2020 16:01   VAS [place] LOWER EXTREMITY VENOUS (DVT)  Result Date: 09/18/2020  Lower Venous DVT Study Indications: Elevated D-dimer, hypoxia, inability to do contrasted CT due to renal insuff.  Comparison  Study: no prior Performing Technologist: Abram Sander RVS  Examination Guidelines: A complete evaluation includes B-mode imaging, spectral Doppler, color Doppler, and power Doppler as needed of all accessible portions of each vessel. Bilateral testing is considered an integral part of a complete examination. Limited examinations for reoccurring indications may be performed as noted. The reflux portion of the exam is performed with the patient in reverse Trendelenburg.  +---------+---------------+---------+-----------+----------+--------------+ RIGHT    CompressibilityPhasicitySpontaneityPropertiesThrombus Aging +---------+---------------+---------+-----------+----------+--------------+ CFV      Full           Yes      Yes                                 +---------+---------------+---------+-----------+----------+--------------+ SFJ      Full                                                        +---------+---------------+---------+-----------+----------+--------------+ FV Prox  Full                                                        +---------+---------------+---------+-----------+----------+--------------+ FV Mid   Full                                                        +---------+---------------+---------+-----------+----------+--------------+  FV DistalFull                                                        +---------+---------------+---------+-----------+----------+--------------+ PFV      Full                                                        +---------+---------------+---------+-----------+----------+--------------+ POP      Full           Yes      Yes                                 +---------+---------------+---------+-----------+----------+--------------+ PTV      Full                                                        +---------+---------------+---------+-----------+----------+--------------+ PERO     Full                                                         +---------+---------------+---------+-----------+----------+--------------+   +---------+---------------+---------+-----------+----------+--------------+ LEFT     CompressibilityPhasicitySpontaneityPropertiesThrombus Aging +---------+---------------+---------+-----------+----------+--------------+ CFV      Full           Yes      Yes                                 +---------+---------------+---------+-----------+----------+--------------+ SFJ      Full                                                        +---------+---------------+---------+-----------+----------+--------------+ FV Prox  Full                                                        +---------+---------------+---------+-----------+----------+--------------+ FV Mid   Full                                                        +---------+---------------+---------+-----------+----------+--------------+ FV DistalFull                                                        +---------+---------------+---------+-----------+----------+--------------+  PFV      Full                                                        +---------+---------------+---------+-----------+----------+--------------+ POP      Full           Yes      Yes                                 +---------+---------------+---------+-----------+----------+--------------+ PTV      Full                                                        +---------+---------------+---------+-----------+----------+--------------+ PERO     Full                                                        +---------+---------------+---------+-----------+----------+--------------+     Summary: BILATERAL: - No evidence of deep vein thrombosis seen in the lower extremities, bilaterally. - No evidence of superficial venous thrombosis in the lower extremities, bilaterally. -No evidence of popliteal cyst,  bilaterally.   *See table(s) above for measurements and observations. Electronically signed by Jamelle Haring on 09/18/2020 at 5:22:29 PM.    Final         Scheduled Meds: . amLODipine  10 mg Oral Daily  . atorvastatin  20 mg Oral Daily  . cloNIDine  0.3 mg Oral TID  . clopidogrel  75 mg Oral Daily  . doxazosin  2 mg Oral QHS  . furosemide  80 mg Intravenous BID  . heparin  5,000 Units Subcutaneous Q12H  . hydrALAZINE  100 mg Oral TID  . insulin aspart  0-9 Units Subcutaneous TID WC  . iron polysaccharides  150 mg Oral Daily  . isosorbide dinitrate  20 mg Oral TID  . losartan  12.5 mg Oral Daily  . metolazone  2.5 mg Oral Daily  . metoprolol succinate  100 mg Oral Daily  . pantoprazole  40 mg Oral Daily  . potassium chloride SA  20 mEq Oral BID  . senna-docusate  1 tablet Oral Q0600  . sodium chloride flush  3 mL Intravenous Q12H  . triamcinolone  1 application Topical BID   Continuous Infusions: . sodium chloride       Time spent: 26 minutes with over 50% of the time coordinating the patient's care    Harold Hedge, DO [facility]   Call night coverage person covering after 7pm

## 2020-09-20 LAB — BASIC METABOLIC PANEL
Anion gap: 12 (ref 5–15)
BUN: 40 mg/dL — ABNORMAL HIGH (ref 8–23)
CO2: 26 mmol/L (ref 22–32)
Calcium: 9.3 mg/dL (ref 8.9–10.3)
Chloride: 104 mmol/L (ref 98–111)
Creatinine, Ser: 4.22 mg/dL — ABNORMAL HIGH (ref 0.61–1.24)
GFR, Estimated: 13 mL/min — ABNORMAL LOW (ref >60)
Glucose, Bld: 156 mg/dL — ABNORMAL HIGH (ref 70–99)
Potassium: 3.2 mmol/L — ABNORMAL LOW (ref 3.5–5.1)
Sodium: 142 mmol/L (ref 135–145)

## 2020-09-20 LAB — GLUCOSE, CAPILLARY
Glucose-Capillary: 126 mg/dL — ABNORMAL HIGH (ref 70–99)
Glucose-Capillary: 158 mg/dL — ABNORMAL HIGH (ref 70–99)

## 2020-09-20 MED ORDER — METOLAZONE 2.5 MG PO TABS: 2.5000 mg | ORAL_TABLET | Freq: Every day | ORAL | Status: DC

## 2020-09-20 MED ORDER — METOPROLOL SUCCINATE ER 100 MG PO TB24: 100.0000 mg | ORAL_TABLET | Freq: Every day | ORAL | 0 refills | Status: DC

## 2020-09-20 MED ORDER — HYDRALAZINE HCL 100 MG PO TABS: 100.0000 mg | ORAL_TABLET | Freq: Three times a day (TID) | ORAL | 0 refills | Status: DC

## 2020-09-20 MED ORDER — POLYETHYLENE GLYCOL 3350 17 G PO PACK
17.0000 g | PACK | Freq: Every day | ORAL | Status: DC
  Administered 2020-09-20: 17 g via ORAL

## 2020-09-20 MED ORDER — LACTATED RINGERS IV BOLUS
250.0000 mL | Freq: Once | INTRAVENOUS | Status: AC
  Administered 2020-09-20: 250 mL via INTRAVENOUS

## 2020-09-20 MED ORDER — KLOR-CON M10 10 MEQ PO TBCR: 20.0000 meq | EXTENDED_RELEASE_TABLET | Freq: Two times a day (BID) | ORAL | 0 refills | Status: DC

## 2020-09-20 MED ORDER — METOLAZONE 2.5 MG PO TABS: 2.5000 mg | ORAL_TABLET | Freq: Every day | ORAL | 0 refills | Status: DC

## 2020-09-20 MED ORDER — CLONIDINE HCL 0.3 MG PO TABS: 0.3000 mg | ORAL_TABLET | Freq: Three times a day (TID) | ORAL | 0 refills | Status: DC

## 2020-09-20 MED ORDER — TORSEMIDE 20 MG PO TABS: 20.0000 mg | ORAL_TABLET | Freq: Two times a day (BID) | ORAL | 0 refills | Status: DC

## 2020-09-20 NOTE — Progress Notes (Signed)
Subjective:  Patient denies any chest pain or shortness of breath. Noted to have worsening renal function. Patient denies of arm Lasix has been DC'd for now receiving to 50 mL normal saline. Has not had BM in the last few days.  Objective:  Vital Signs in the last 24 hours: Temp:  [98.2 F (36.8 C)-99.1 F (37.3 C)] 98.5 F (36.9 C) (01/30 1130) Pulse Rate:  [55-64] 57 (01/30 1130) Resp:  [18-20] 20 (01/30 1130) BP: (141-171)/(53-71) 141/71 (01/30 1130) SpO2:  [95 %-100 %] 98 % (01/30 1130) Weight:  [77 kg] 77 kg (01/30 0344)  Intake/Output from previous day: 01/29 0701 - 01/30 0700 In: 840 [P.O.:840] Out: 2200 [Urine:2200] Intake/Output from this shift: Total I/O In: -  Out: 400 [Urine:400]  Physical Exam: Neck: no adenopathy, no carotid bruit, no JVD and supple, symmetrical, trachea midline Lungs: clear to auscultation bilaterally Heart: regular rate and rhythm, S1, S2 normal and 532 systolic murmur and diastolic murmur noted no S3 gallop Abdomen: soft, non-tender; bowel sounds normal; no masses,  no organomegaly Extremities: extremities normal, atraumatic, no cyanosis or edema  Lab Results: No results for input(s): WBC, HGB, PLT in the last 72 hours. Recent Labs    09/19/20 0501 09/20/20 0238  NA 144 142  K 3.3* 3.2*  CL 110 104  CO2 23 26  GLUCOSE 139* 156*  BUN 39* 40*  CREATININE 3.94* 4.22*   No results for input(s): TROPONINI in the last 72 hours.  Invalid input(s): CK, MB Hepatic Function Panel No results for input(s): PROT, ALBUMIN, AST, ALT, ALKPHOS, BILITOT, BILIDIR, IBILI in the last 72 hours. No results for input(s): CHOL in the last 72 hours. No results for input(s): PROTIME in the last 72 hours.  Imaging: Imaging results have been reviewed and VAS [place] LOWER EXTREMITY VENOUS (DVT)  Result Date: 09/18/2020  Lower Venous DVT Study Indications: Elevated D-dimer, hypoxia, inability to do contrasted CT due to renal insuff.  Comparison Study: no prior  Performing Technologist: Abram Sander RVS  Examination Guidelines: A complete evaluation includes B-mode imaging, spectral Doppler, color Doppler, and power Doppler as needed of all accessible portions of each vessel. Bilateral testing is considered an integral part of a complete examination. Limited examinations for reoccurring indications may be performed as noted. The reflux portion of the exam is performed with the patient in reverse Trendelenburg.  +---------+---------------+---------+-----------+----------+--------------+ RIGHT    CompressibilityPhasicitySpontaneityPropertiesThrombus Aging +---------+---------------+---------+-----------+----------+--------------+ CFV      Full           Yes      Yes                                 +---------+---------------+---------+-----------+----------+--------------+ SFJ      Full                                                        +---------+---------------+---------+-----------+----------+--------------+ FV Prox  Full                                                        +---------+---------------+---------+-----------+----------+--------------+ FV Mid   Full                                                        +---------+---------------+---------+-----------+----------+--------------+  FV DistalFull                                                        +---------+---------------+---------+-----------+----------+--------------+ PFV      Full                                                        +---------+---------------+---------+-----------+----------+--------------+ POP      Full           Yes      Yes                                 +---------+---------------+---------+-----------+----------+--------------+ PTV      Full                                                        +---------+---------------+---------+-----------+----------+--------------+ PERO     Full                                                         +---------+---------------+---------+-----------+----------+--------------+   +---------+---------------+---------+-----------+----------+--------------+ LEFT     CompressibilityPhasicitySpontaneityPropertiesThrombus Aging +---------+---------------+---------+-----------+----------+--------------+ CFV      Full           Yes      Yes                                 +---------+---------------+---------+-----------+----------+--------------+ SFJ      Full                                                        +---------+---------------+---------+-----------+----------+--------------+ FV Prox  Full                                                        +---------+---------------+---------+-----------+----------+--------------+ FV Mid   Full                                                        +---------+---------------+---------+-----------+----------+--------------+ FV DistalFull                                                        +---------+---------------+---------+-----------+----------+--------------+  PFV      Full                                                        +---------+---------------+---------+-----------+----------+--------------+ POP      Full           Yes      Yes                                 +---------+---------------+---------+-----------+----------+--------------+ PTV      Full                                                        +---------+---------------+---------+-----------+----------+--------------+ PERO     Full                                                        +---------+---------------+---------+-----------+----------+--------------+     Summary: BILATERAL: - No evidence of deep vein thrombosis seen in the lower extremities, bilaterally. - No evidence of superficial venous thrombosis in the lower extremities, bilaterally. -No evidence of popliteal cyst, bilaterally.   *See  table(s) above for measurements and observations. Electronically signed by Jamelle Haring on 09/18/2020 at 5:22:29 PM.    Final     Cardiac Studies:  Assessment/Plan:  Compensated congestive heart failure secondary to preserved LV systolic function secondary to noncompliance.to Diet and fluid intake Minimally elevated high sensitivity troponin I secondary to demand ischemia/CHF/renal insufficiency Hypertensive heart disease with diastolic dysfunction. Valvular heart disease Right lung nodule Hypertension. Diabetes mellitus. Hyperlipidemia. Cerebrovascular disease, history of PTA the remote past, internal carotid artery. Acute on chronic kidney disease stage IV. Secondary to overdiuresis/ARB Anemia of chronic disease.  History of carcinoma of the prostate. GERD. Hypokalemia. Constipation Plan Agree with IV normal saline 250 bolus only. Reduce Lasix to 40 mg IV twice daily if still in the hospital otherwise switch to torsemide 20 mg twice daily I will sign off please call if needed follow-up with me next week Heart failure instructions Rx for constipation  LOS: 4 days    Austin [name] 09/20/2020, 11:56 AM

## 2020-09-20 NOTE — Discharge Summary (Signed)
Physician Discharge Summary  [place] [name] KCM:[id number] DOB: Jun 23, 1935   PCP: Dorothyann Peng, NP  Admit date: 09/16/2020 Discharge date: 09/20/2020 Length of Stay: 4 days   Code Status: Full Code  Admitted From:  Home Discharged to:   [place]:  None  Equipment/Devices:  None Discharge Condition:  Stable  Recommendations for Outpatient Follow-up   1. Follow up with cardiology in 1 week 2. Follow up BMP  3. Follow-up with nephrology  Hospital Summary   This is an 86 year old male with past medical history of hypertension, chronic diastolic CHF, type 2 diabetes, CKD 4, hyperlipidemia presented to the ED with several days of shortness of breath.  Found to have BP 202/88 and pulmonary edema and admitted for acute diastolic heart failure exacerbation.  Exacerbation thought to be due to noncompliance to diet and fluid intake. Cardiology was consulted and patient was diuresed.  Initially with IV Lasix.  Zaroxolyn 2.5 mg daily was added.  He had improvement in his volume status and IV Lasix was switched to torsemide 20 mg twice daily on 1/29.  However, his creatinine continued to increase in the setting of diuresis and losartan.  Baseline creatinine around 3.0, increased to 4.22 on 1/31 patient did not require urgent dialysis and was still making urine.  His losartan and Zaroxolyn was held on 1/30 and he was given a small bolus of 250 cc fluids.  Discussed with Dr. Jonnie Finner, nephrology, and patient is okay for discharge from nephro point of view to follow-up with his nephrologist, Dr. Royce Macadamia.  He was discharged in stable condition with recommendations for outpatient follow-up with cardiology and nephrology this week with repeat BMP.  Medication adjustments as below.  A & P   Principal Problem:   Acute diastolic CHF (congestive heart failure) (HCC) Active Problems:   Diabetes mellitus with renal complications (HCC)   PROSTATE CANCER, HX OF   Hypertensive urgency   CKD (chronic kidney  disease), stage IV (HCC)   Mass of right lung    1. Acute hypoxemic respiratory failure secondary to acute on chronic diastolic heart failure secondary to noncompliance to diet and fluid intake, resolved a. Initial ABG with pO2 52 on room air b. Currently on room air and volume status improved c. Due to his renal function, Zaroxolyn and losartan were held this a.m. as below d. Discharged on torsemide 20 mg twice daily and metolazone 2.5 mg daily e. Follow-up with cardiology this week  2. Hypertensive urgency with elevated troponin (likely demand ischemia/CHF/renal insufficiency) a. Toprol and hydralazine increased: Toprol-XL 100 mg daily and hydralazine 100 mg 3 times daily b. Losartan held this a.m. c. Also on losartan, amlodipine, clonidine, doxazosin and isosorbide   3. CKD 4 with elevated creatinine from baseline a. Creatinine 3.51 on admission and currently 4.22, baseline about 3.0.   b. Asymptomatic and not uremic c. Patient of Dr. Royce Macadamia, nephrology d. Due to his renal function, Zaroxolyn and losartan were held this a.m. however he had received torsemide this a.m. already.  Received 250 cc bolus IV fluids today and can restart losartan and Zaroxolyn tomorrow, Monday, along with torsemide and close outpatient follow-up  4. Fever of unclear etiology, resolved  5. New right upper lobe lung mass a. 2.4 x 1.8 cm with irregular margins, bigger than 2018 b. Hospitalist discussed with pulmonology who recommended outpatient work-up.  Info provided on discharge  6. Hypokalemia a. Continue potassium supplement  7. Diabetes with renal complications a. Continue outpatient management  8. Constipation a.  Continue outpatient management with lactulose b. OTC senna or MiraLAX     Consultants  . Cardiology  Procedures  . None  Antibiotics   Anti-infectives (From admission, onward)   None       Subjective  Patient seen and examined at bedside no acute distress and  resting comfortably.  No events overnight.  Tolerating diet.  Has not had a BM just yet but feels like he may this afternoon.  Denies any chest pain, shortness of breath, fever, nausea, vomiting, urinary or bowel complaints. Otherwise ROS negative    Objective   Discharge Exam: Vitals:   09/20/20 0943 09/20/20 1130  BP: (!) 160/60 (!) 141/71  Pulse: 64 (!) 57  Resp:  20  Temp:  98.5 F (36.9 C)  SpO2:  98%   Vitals:   09/20/20 0735 09/20/20 0930 09/20/20 0943 09/20/20 1130  BP: (!) 150/53 (!) 160/60 (!) 160/60 (!) 141/71  Pulse: (!) 55 64 64 (!) 57  Resp: 20   20  Temp: 98.4 F (36.9 C)   98.5 F (36.9 C)  TempSrc: Oral   Oral  SpO2: 95%   98%  Weight:      Height:        Physical Exam Vitals and nursing note reviewed.  Constitutional:      Appearance: Normal appearance.  HENT:     Head: Normocephalic and atraumatic.  Eyes:     Conjunctiva/sclera: Conjunctivae normal.  Cardiovascular:     Rate and Rhythm: Normal rate and regular rhythm.  Pulmonary:     Effort: Pulmonary effort is normal.     Breath sounds: Normal breath sounds.  Abdominal:     General: Abdomen is flat.     Palpations: Abdomen is soft.  Musculoskeletal:        General: No swelling or tenderness.     Comments: Trace lower extremity pitting edema bilaterally  Skin:    Coloration: Skin is not jaundiced or pale.  Neurological:     Mental Status: He is alert. Mental status is at baseline.  Psychiatric:        Mood and Affect: Mood normal.        Behavior: Behavior normal.       The results of significant diagnostics from this hospitalization (including imaging, microbiology, ancillary and laboratory) are listed below for reference.     Microbiology: Recent Results (from the past 240 hour(s))  SARS Coronavirus 2 by RT PCR (hospital order, performed in [facility] hospital lab) Nasopharyngeal Nasopharyngeal Swab     Status: None   Collection Time: 09/16/20  7:57 AM   Specimen:  Nasopharyngeal Swab  Result Value Ref Range Status   SARS Coronavirus 2 NEGATIVE NEGATIVE Final    Comment: (NOTE) SARS-CoV-2 target nucleic acids are NOT DETECTED.  The SARS-CoV-2 RNA is generally detectable in upper and lower respiratory specimens during the acute phase of infection. The lowest concentration of SARS-CoV-2 viral copies this assay can detect is 250 copies / mL. A negative result does not preclude SARS-CoV-2 infection and should not be used as the sole basis for treatment or other patient management decisions.  A negative result may occur with improper specimen collection / handling, submission of specimen other than nasopharyngeal swab, presence of viral mutation(s) within the areas targeted by this assay, and inadequate number of viral copies (<250 copies / mL). A negative result must be combined with clinical observations, patient history, and epidemiological information.  Fact Sheet for Patients:   StrictlyIdeas.no  Fact Sheet for Healthcare Providers: BankingDealers.co.za  This test is not yet approved or  cleared by the [place] FDA and has been authorized for detection and/or diagnosis of SARS-CoV-2 by FDA under an Emergency Use Authorization (EUA).  This EUA will remain in effect (meaning this test can be used) for the duration of the COVID-19 declaration under Section 564(b)(1) of the Act, 21 U.S.C. section 360bbb-3(b)(1), unless the authorization is terminated or revoked sooner.  Performed at [facility], [place] [address], [place], [place] [address]   Culture, Urine     Status: Abnormal   Collection Time: 09/17/20  7:46 AM   Specimen: Urine, Random  Result Value Ref Range Status   Specimen Description URINE, RANDOM  Final   Special Requests NONE  Final   Culture (A)  Final    <10,000 COLONIES/mL INSIGNIFICANT GROWTH Performed at [facility], [place] [address] [place], [place] [address]     Report Status 09/18/2020 FINAL  Final  Culture, blood (Routine X 2) w Reflex to ID Panel     Status: None (Preliminary result)   Collection Time: 09/17/20  8:40 AM   Specimen: BLOOD  Result Value Ref Range Status   Specimen Description BLOOD LEFT ANTECUBITAL  Final   Special Requests   Final    BOTTLES DRAWN AEROBIC AND ANAEROBIC Blood Culture results may not be optimal due to an inadequate volume of blood received in culture bottles   Culture   Final    NO GROWTH 3 DAYS Performed at [facility], [place] [address] [place], [place] [address]    Report Status PENDING  Incomplete  Culture, blood (Routine X 2) w Reflex to ID Panel     Status: None (Preliminary result)   Collection Time: 09/17/20  8:40 AM   Specimen: BLOOD  Result Value Ref Range Status   Specimen Description BLOOD RIGHT ANTECUBITAL  Final   Special Requests   Final    BOTTLES DRAWN AEROBIC AND ANAEROBIC Blood Culture results may not be optimal due to an inadequate volume of blood received in culture bottles   Culture   Final    NO GROWTH 3 DAYS Performed at [facility], [place] [address], [place], [place] [address]    Report Status PENDING  Incomplete     Labs: BNP (last 3 results) Recent Labs    11/28/19 0529 02/23/20 1559 09/16/20 0938  BNP 1,296.2* 1,713.3* 0,737.1*   Basic Metabolic Panel: Recent Labs  Lab 09/16/20 0759 09/16/20 1604 09/17/20 0159 09/18/20 0527 09/19/20 0501 09/20/20 0238  NA 142  --  143 144 144 142  K 3.1*  --  2.9* 3.4* 3.3* 3.2*  CL 107  --  110 110 110 104  CO2 23  --  '22 23 23 26  ' GLUCOSE 135*  --  128* 131* 139* 156*  BUN 35*  --  32* 33* 39* 40*  CREATININE 3.51*  --  3.60* 3.70* 3.94* 4.22*  CALCIUM 8.7*  --  8.6* 9.0 9.3 9.3  MG  --  1.9 1.9  --   --   --    Liver Function Tests: Recent Labs  Lab 09/16/20 0956  AST 14*  ALT 9  ALKPHOS 71  BILITOT 0.6  PROT 7.0  ALBUMIN 3.4*   Recent Labs  Lab 09/16/20 0956  LIPASE 27   No results for  input(s): AMMONIA in the last 168 hours. CBC: Recent Labs  Lab 09/16/20 0759  WBC 5.7  HGB 10.0*  HCT 32.0*  MCV 84.7  PLT 262   Cardiac Enzymes: No results for input(s): CKTOTAL, CKMB, CKMBINDEX, TROPONINI in the last 168 hours. BNP: Invalid input(s): POCBNP CBG: Recent Labs  Lab 09/19/20 1102 09/19/20 1539 09/19/20 2120 09/20/20 0613 09/20/20 1127  GLUCAP 143* 198* 122* 126* 158*   D-Dimer Recent Labs    09/17/20 1702  DDIMER 1.43*   Hgb A1c No results for input(s): HGBA1C in the last 72 hours. Lipid Profile No results for input(s): CHOL, HDL, LDLCALC, TRIG, CHOLHDL, LDLDIRECT in the last 72 hours. Thyroid function studies No results for input(s): TSH, T4TOTAL, T3FREE, THYROIDAB in the last 72 hours.  Invalid input(s): FREET3 Anemia work up No results for input(s): VITAMINB12, FOLATE, FERRITIN, TIBC, IRON, RETICCTPCT in the last 72 hours. Urinalysis    Component Value Date/Time   COLORURINE YELLOW 09/17/2020 1240   APPEARANCEUR CLEAR 09/17/2020 1240   LABSPEC 1.012 09/17/2020 1240   PHURINE 6.0 09/17/2020 1240   GLUCOSEU 50 (A) 09/17/2020 1240   HGBUR SMALL (A) 09/17/2020 1240   HGBUR negative 09/19/2007 0000   BILIRUBINUR NEGATIVE 09/17/2020 1240   BILIRUBINUR n 11/18/2014 1003   KETONESUR NEGATIVE 09/17/2020 1240   PROTEINUR >=300 (A) 09/17/2020 1240   UROBILINOGEN 0.2 11/18/2014 1003   UROBILINOGEN 1.0 01/26/2013 2059   NITRITE NEGATIVE 09/17/2020 1240   LEUKOCYTESUR NEGATIVE 09/17/2020 1240   Sepsis Labs Invalid input(s): PROCALCITONIN,  WBC,  LACTICIDVEN Microbiology Recent Results (from the past 240 hour(s))  SARS Coronavirus 2 by RT PCR (hospital order, performed in [facility] hospital lab) Nasopharyngeal Nasopharyngeal Swab     Status: None   Collection Time: 09/16/20  7:57 AM   Specimen: Nasopharyngeal Swab  Result Value Ref Range Status   SARS Coronavirus 2 NEGATIVE NEGATIVE Final    Comment: (NOTE) SARS-CoV-2 target nucleic acids are  NOT DETECTED.  The SARS-CoV-2 RNA is generally detectable in upper and lower respiratory specimens during the acute phase of infection. The lowest concentration of SARS-CoV-2 viral copies this assay can detect is 250 copies / mL. A negative result does not preclude SARS-CoV-2 infection and should not be used as the sole basis for treatment or other patient management decisions.  A negative result may occur with improper specimen collection / handling, submission of specimen other than nasopharyngeal swab, presence of viral mutation(s) within the areas targeted by this assay, and inadequate number of viral copies (<250 copies / mL). A negative result must be combined with clinical observations, patient history, and epidemiological information.  Fact Sheet for Patients:   StrictlyIdeas.no  Fact Sheet for Healthcare Providers: BankingDealers.co.za  This test is not yet approved or  cleared by the [place] FDA and has been authorized for detection and/or diagnosis of SARS-CoV-2 by FDA under an Emergency Use Authorization (EUA).  This EUA will remain in effect (meaning this test can be used) for the duration of the COVID-19 declaration under Section 564(b)(1) of the Act, 21 U.S.C. section 360bbb-3(b)(1), unless the authorization is terminated or revoked sooner.  Performed at [facility], [place] [address], [place], [place] [address]   Culture, Urine     Status: Abnormal   Collection Time: 09/17/20  7:46 AM   Specimen: Urine, Random  Result Value Ref Range Status   Specimen Description URINE, RANDOM  Final   Special Requests NONE  Final   Culture (A)  Final    <10,000 COLONIES/mL INSIGNIFICANT GROWTH Performed at [facility], [place]  [address] [place], [place] [address]    Report Status 09/18/2020 FINAL  Final  Culture, blood (Routine X 2) w Reflex to ID Panel     Status: None (Preliminary result)   Collection Time:  09/17/20  8:40 AM   Specimen: BLOOD  Result Value Ref Range Status   Specimen Description BLOOD LEFT ANTECUBITAL  Final   Special Requests   Final    BOTTLES DRAWN AEROBIC AND ANAEROBIC Blood Culture results may not be optimal due to an inadequate volume of blood received in culture bottles   Culture   Final    NO GROWTH 3 DAYS Performed at [facility], [place] [address], [place], [place] [address]    Report Status PENDING  Incomplete  Culture, blood (Routine X 2) w Reflex to ID Panel     Status: None (Preliminary result)   Collection Time: 09/17/20  8:40 AM   Specimen: BLOOD  Result Value Ref Range Status   Specimen Description BLOOD RIGHT ANTECUBITAL  Final   Special Requests   Final    BOTTLES DRAWN AEROBIC AND ANAEROBIC Blood Culture results may not be optimal due to an inadequate volume of blood received in culture bottles   Culture   Final    NO GROWTH 3 DAYS Performed at [facility], [place] [address] [place],  [address]    Report Status PENDING  Incomplete    Discharge Instructions     Discharge Instructions    Diet - low sodium heart healthy   Complete by: As directed    Discharge instructions   Complete by: As directed    You were seen and examined in the hospital for exacerbation of your heart failure.  Upon discharge: -You have had multiple medication adjustments and you are starting metolazone daily.  Please review the after visit summary for further details -Get lab work this week -Follow-up with your cardiologist later this week -Follow-up with your kidney doctor later this week -You can take over-the-counter MiraLAX or senna daily as needed for constipation -You were also incidentally found to have a lung nodule on your chest x-ray.  Follow-up with pulmonology  If you have any significant change or worsening of your symptoms please do not hesitate to contact your primary care physician or return to the ED   Increase activity slowly    Complete by: As directed      Allergies as of 09/20/2020      Reactions   Aspirin Other (See Comments)   High doses causes stomach ulcer and bleeding      Medication List    STOP taking these medications   gabapentin 100 MG capsule Commonly known as: NEURONTIN     TAKE these medications   Accu-Chek Aviva Plus test strip Generic drug: glucose blood Used to check blood glucose BID or PRN What changed:   how much to take  how to take this  when to take this   Accu-Chek Aviva Plus w/Device Kit Used to check blood glucose 2 times a day or PRN What changed:   how much to take  how to take this  when to take this   accu-chek multiclix lancets Used to check blood glucose BID or PRN What changed:   how much to take  how to take this  when to take this   acetaminophen 325 MG tablet Commonly known as: TYLENOL Take 650 mg by mouth every 6 (six) hours as needed for mild pain or moderate  pain.   amLODipine 10 MG tablet Commonly known as: NORVASC TAKE 1 TABLET BY MOUTH EVERY DAY   atorvastatin 20 MG tablet Commonly known as: LIPITOR TAKE 1 TABLET BY MOUTH EVERY DAY   cholecalciferol 25 MCG (1000 UNIT) tablet Commonly known as: VITAMIN D3 Take 1,000 Units by mouth daily.   cloNIDine 0.3 MG tablet Commonly known as: CATAPRES Take 1 tablet (0.3 mg total) by mouth 3 (three) times daily. What changed:   medication strength  how much to take  Another medication with the same name was removed. Continue taking this medication, and follow the directions you see here.   clopidogrel 75 MG tablet Commonly known as: PLAVIX TAKE 1 TABLET BY MOUTH EVERY DAY   doxazosin 2 MG tablet Commonly known as: CARDURA Take 2 mg by mouth at bedtime.   esomeprazole 40 MG capsule Commonly known as: NEXIUM TAKE 1 CAPSULE BY MOUTH EVERY DAY What changed:   how much to take  additional instructions   glipiZIDE 5 MG 24 hr tablet Commonly known as: GLUCOTROL XL Take 1  tablet (5 mg total) by mouth daily. Need diabetic follow up for further refills   hydrALAZINE 100 MG tablet Commonly known as: APRESOLINE Take 1 tablet (100 mg total) by mouth 3 (three) times daily. What changed:   medication strength  how much to take   hydrocortisone 2.5 % rectal cream Commonly known as: ANUSOL-HC PLACE 1 APPLICATION RECTALLY 2 TIMES A DAY. What changed: See the new instructions.   isosorbide dinitrate 20 MG tablet Commonly known as: ISORDIL Take 1 tablet (20 mg total) by mouth 3 (three) times daily.   Klor-Con M10 10 MEQ tablet Generic drug: potassium chloride Take 2 tablets (20 mEq total) by mouth 2 (two) times daily. What changed:   how much to take  when to take this   lactulose 10 GM/15ML solution Commonly known as: Vaughn 15 ML BY MOUTH 2 TIMES DAILY AS NEEDED FOR MILD CONSTIPATION. What changed: See the new instructions.   losartan 25 MG tablet Commonly known as: COZAAR Take 12.5 mg by mouth daily.   metolazone 2.5 MG tablet Commonly known as: ZAROXOLYN Take 1 tablet (2.5 mg total) by mouth daily. Start taking on: September 21, 2020   metoprolol succinate 100 MG 24 hr tablet Commonly known as: TOPROL-XL Take 1 tablet (100 mg total) by mouth daily. Take with or immediately following a meal. Start taking on: September 21, 2020 What changed:   additional instructions  Another medication with the same name was removed. Continue taking this medication, and follow the directions you see here.   Poly-Iron 150 150 MG capsule Generic drug: iron polysaccharides Take 150 mg by mouth daily.   SYSTANE BALANCE OP Place 1 drop into both eyes daily as needed.   torsemide 20 MG tablet Commonly known as: DEMADEX Take 1 tablet (20 mg total) by mouth 2 (two) times daily. What changed: Another medication with the same name was removed. Continue taking this medication, and follow the directions you see here.   triamcinolone 0.1 % Commonly  known as: KENALOG Apply 1 application topically 2 (two) times daily.       Follow-up Information    [facility]. Schedule an appointment as soon as possible for a visit in 2 week(s).   Specialty: Pulmonology Contact information: [address] [place] [address] [phone]       Claudia Desanctis, MD. Schedule an appointment as soon as possible  for a visit in 1 week(s).   Specialty: Nephrology Contact information: [place] [address] [phone]        Charolette Forward, MD. Schedule an appointment as soon as possible for a visit in 1 week(s).   Specialty: Cardiology Contact information: [place] [address] [phone]              Allergies  Allergen Reactions  . Aspirin Other (See Comments)    High doses causes stomach ulcer and bleeding    Dispo: The patient is from: Home              Anticipated d/c is to: Home              Anticipated d/c date is: Today              Patient currently is medically stable to d/c.        Time coordinating discharge: Over 30 minutes   SIGNED:   Harold Hedge, D.O. [facility] Pager: [phone]  09/20/2020, 2:25 PM

## 2020-09-20 NOTE — Discharge Instructions (Signed)
Heart Failure, Diagnosis  Heart failure means that your heart is not able to pump blood in the right way. This makes it hard for your body to work well. Heart failure is usually a long-term (chronic) condition. You must take good care of yourself and follow your treatment plan from your doctor. What are the causes?  High blood pressure.  Buildup of cholesterol and fat in the arteries.  Heart attack. This injures the heart muscle.  Heart valves that do not open and close properly.  Damage of the heart muscle. This is also called cardiomyopathy.  Infection of the heart muscle. This is also called myocarditis.  Lung disease. What increases the risk?  Getting older. The risk of heart failure goes up as a person ages.  Being overweight.  Being male.  Use tobacco or nicotine products.  Abusing alcohol or drugs.  Having taken medicines that can damage the heart.  Having any of these conditions: ? Diabetes. ? Abnormal heart rhythms. ? Thyroid problems. ? Low blood counts (anemia).  Having a family history of heart failure. What are the signs or symptoms?  Shortness of breath.  Coughing.  Swelling of the feet, ankles, legs, or belly.  Losing or gaining weight for no reason.  Trouble breathing.  Waking from sleep because of the need to sit up and get more air.  Fast heartbeat.  Being very tired.  Feeling dizzy, or feeling like you may pass out (faint).  Having no desire to eat.  Feeling like you may vomit (nauseous).  Peeing (urinating) more at night.  Feeling confused. How is this treated? This condition may be treated with:  Medicines. These can be given to treat blood pressure and to make the heart muscles stronger.  Changes in your daily life. These may include: ? Eating a healthy diet. ? Staying at a healthy body weight. ? Quitting tobacco, alcohol, and drug use. ? Doing exercises. ? Participating in a cardiac rehabilitation program. This program  helps you improve your health through exercise, education, and counseling.  Surgery. Surgery can be done to open blocked valves, or to put devices in the heart, such as pacemakers.  A donor heart (heart transplant). You will receive a healthy heart from a donor. Follow these instructions at home:  Treat other conditions as told by your doctor. These may include high blood pressure, diabetes, thyroid disease, or abnormal heart rhythms.  Learn as much as you can about heart failure.  Get support as you need it.  Keep all follow-up visits. Summary  Heart failure means that your heart is not able to pump blood in the right way.  This condition is often caused by high blood pressure, heart attack, or damage of the heart muscle.  Symptoms of this condition include shortness of breath and swelling of the feet, ankles, legs, or belly. You may also feel very tired or feel like you may vomit.  You may be treated with medicines, surgery, or changes in your daily life.  Treat other health conditions as told by your doctor. This information is not intended to replace advice given to you by your health care provider. Make sure you discuss any questions you have with your health care provider. Document Revised: 02/29/2020 Document Reviewed: 02/29/2020 Elsevier Patient Education  2021 Elsevier Inc.  

## 2020-09-22 ENCOUNTER — Other Ambulatory Visit: Payer: Self-pay

## 2020-09-22 LAB — CULTURE, BLOOD (ROUTINE X 2)
Culture: NO GROWTH
Culture: NO GROWTH

## 2020-09-23 ENCOUNTER — Encounter: Payer: Self-pay | Admitting: Adult Health

## 2020-09-23 ENCOUNTER — Ambulatory Visit: Payer: Medicare PPO | Admitting: Adult Health

## 2020-09-23 VITALS — BP 154/60 | Temp 98.1°F | Wt 177.0 lb

## 2020-09-23 DIAGNOSIS — K5901 Slow transit constipation: Secondary | ICD-10-CM

## 2020-09-23 DIAGNOSIS — E876 Hypokalemia: Secondary | ICD-10-CM

## 2020-09-23 DIAGNOSIS — I10 Essential (primary) hypertension: Secondary | ICD-10-CM

## 2020-09-23 DIAGNOSIS — I160 Hypertensive urgency: Secondary | ICD-10-CM

## 2020-09-23 DIAGNOSIS — I5033 Acute on chronic diastolic (congestive) heart failure: Secondary | ICD-10-CM | POA: Diagnosis not present

## 2020-09-23 DIAGNOSIS — N184 Chronic kidney disease, stage 4 (severe): Secondary | ICD-10-CM | POA: Diagnosis not present

## 2020-09-23 DIAGNOSIS — E1122 Type 2 diabetes mellitus with diabetic chronic kidney disease: Secondary | ICD-10-CM

## 2020-09-23 DIAGNOSIS — R918 Other nonspecific abnormal finding of lung field: Secondary | ICD-10-CM

## 2020-09-23 LAB — BASIC METABOLIC PANEL
BUN: 74 mg/dL — ABNORMAL HIGH (ref 6–23)
CO2: 29 mEq/L (ref 19–32)
Calcium: 9.4 mg/dL (ref 8.4–10.5)
Chloride: 101 mEq/L (ref 96–112)
Creatinine, Ser: 5.63 mg/dL (ref 0.40–1.50)
GFR: 8.72 mL/min — CL (ref >60.00)
Glucose, Bld: 134 mg/dL — ABNORMAL HIGH (ref 70–99)
Potassium: 3.8 mEq/L (ref 3.5–5.1)
Sodium: 140 mEq/L (ref 135–145)

## 2020-09-23 NOTE — Addendum Note (Signed)
Addended by: Tessie Fass D on: 09/23/2020 09:45 AM   Modules accepted: Orders

## 2020-09-23 NOTE — Patient Instructions (Signed)
It was great seeing you today   I am going to do blood work today and will follow up with you regarding the results.   Someone from pulmonary will call you to schedule your appointment   Please watch how much fluid you drink and stay away from sodium

## 2020-09-23 NOTE — Progress Notes (Signed)
Subjective:    Patient ID: Austin [name], male    DOB: 07-Feb-1935, 86 y.o.   MRN: [id number]  HPI 86 year old male who  has a past medical history of ANEMIA DUE TO CHRONIC BLOOD LOSS (03/13/2007), CAROTID ARTERY STENOSIS (05/10/2010), CHF (congestive heart failure) ([place]), DIABETES MELLITUS, TYPE II (09/19/2007), DISEASE, CEREBROVASCULAR NEC (03/05/2007), GERD (03/13/2007), HYPERLIPIDEMIA (03/05/2007), HYPERTENSION (03/05/2007), HYPOKALEMIA (11/09/2009), KNEE PAIN, RIGHT (11/09/2009), PROSTATE CANCER, HX OF (03/05/2007), and RENAL DISEASE, CHRONIC (02/03/2009).  He presents to the office today for TCM visit   Admit Date: 09/16/2020 Discharge Date 09/20/2020  He presented to the ER with SOB for several days prior. He was found to have BP of 202/88 and pulmonary edema and admitted for acute diastolic heart failure exacerbation. His exacerbation was thought to be due to non compliance to diet and fluid intake. Cardiology was consulted and he as placed on diuretics, initially with IV lasix. Zaroxolyn 2.5 mg was added. He did have improvement his his volume status  And IV lasix was switched to torsemide 20 mg daily. On 1/29. Unfortunately, his creatinine continued to increase in the setting of diuretics and losartan where his baseline was around 3.0 and it increased to 4.22 on 1/31. Zaroxolyn was held on 1/30 and he was given a small bolus of 250 cc. Nephrology ok with discharge and follow up outpatient   Hospital Course  1.  Acute diastolic CHF- Acute hypoxic respiratory failure  - Initial ABG with po2 52 on room air  - Was on room air upon discharge  - d/t renal function losartan and Zaroxolyn were held  - Discharged on torsemide 20 mg BID and metolazone 2.5 mg daily.  - Follow up with cardiology   2. Hypertension Urgency with elevated troponin  - likely demand ischemia/CHF/renal insufficiency  - Toprol and hydralazine increased: Toprol XL 100 mg daily and Hydralazine 100 mg TID.  - Losartan held this am  -  Also on losartan, norvasc, clonidine, doxazosin, and isosorbide  3. CKD 4 with elevated creatinine from baseline  - Cr 3.51 on admission ---> 4.22 on discharge  - Asymptomatic and uremic  - Follow up with Dr. Royce Macadamia outpatient  - D/t renal function, Zaroxolyn and losartan were held but restarted prior to discharge   4. Fever of unclear etiology  - Resolved   5. New right upper lobe lung mass  - 2.4 x 1.8 cm with irregular margins, bigger than 2018  - Hospitalist discussed with pulmonary who recommended outpatient workup  6. Hypokalemia  - Continue potassium supplement   7. Diabetes with renal complications  - Continue with outpatient management  Lab Results  Component Value Date   HGBA1C 6.8 (H) 09/16/2020   8. Constipation - continue with outpatient management of lactulose  - OTC senna or Miralax   He reports today that since being discharged from the hospital he is feeling much better. He feels back to baseline and is no longer experiencing shortness of breath. Denies CP, fevers, or chills since being discharged.   He has a follow up appointment with Cardiology this week. Has not heard back from Nephrology yet  He denies any acute issues   Review of Systems  Constitutional: Negative.   HENT: Negative.   Eyes: Negative.   Respiratory: Negative.   Cardiovascular: Negative.   Gastrointestinal: Negative.   Endocrine: Negative.   Genitourinary: Negative.   Musculoskeletal: Negative.   Skin: Negative.   Allergic/Immunologic: Negative.   Neurological: Negative.   Hematological: Negative.  Psychiatric/Behavioral: Negative.   All other systems reviewed and are negative.  Past Medical History:  Diagnosis Date  . 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 CAROTID ARTERY STENOSIS 05/10/2010  . CHF (congestive heart failure) ([place])   . DIABETES MELLITUS, TYPE II 09/19/2007  . DISEASE, CEREBROVASCULAR NEC 03/05/2007  . GERD 03/13/2007  . HYPERLIPIDEMIA 03/05/2007  .  HYPERTENSION 03/05/2007  . HYPOKALEMIA 11/09/2009  . KNEE PAIN, RIGHT 11/09/2009  . PROSTATE CANCER, HX OF 03/05/2007  . RENAL DISEASE, CHRONIC 02/03/2009    Social History   Socioeconomic History  . Marital status: Married    Spouse name: Not on file  . Number of children: Not on file  . Years of education: Not on file  . Highest education level: Not on file  Occupational History  . Not on file  Tobacco Use  . Smoking status: Former Smoker    Types: Cigarettes    Quit date: 08/22/1974    Years since quitting: 46.1  . Smokeless tobacco: Never Used  Vaping Use  . Vaping Use: Never used  Substance and Sexual Activity  . Alcohol use: No    Alcohol/week: 0.0 standard drinks  . Drug use: No  . Sexual activity: Not on file  Other Topics Concern  . 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 CAROTID ARTERY ANGIOPLASTY Right Oct. 10, 2001  . ESOPHAGOGASTRODUODENOSCOPY (EGD) WITH PROPOFOL N/A 11/04/2016   Procedure: ESOPHAGOGASTRODUODENOSCOPY (EGD) WITH PROPOFOL;  Surgeon: Otis Brace, MD;  Location: [facility];  Service: Gastroenterology;  Laterality: N/A;  . LAPAROSCOPIC APPENDECTOMY  02/20/2012   Procedure: APPENDECTOMY LAPAROSCOPIC;  Surgeon: Stark Klein, MD;  Location: [facility] OR;  Service: General;  Laterality: N/A;  . PROSTATE SURGERY     prostatectomy    Family History  Problem Relation Age of Onset  . Hypertension Mother   . Cancer Father        Mesothelioma   . Stomach cancer Brother   . Cancer Brother   . Cancer - Cervical Brother   . Cancer Brother   . Diabetes Brother    . Esophageal cancer Neg Hx   . Colon cancer Neg Hx   . Pancreatic cancer Neg Hx     Allergies  Allergen Reactions  . Aspirin Other (See Comments)    High doses causes stomach ulcer and bleeding    Current Outpatient Medications on File Prior to Visit  Medication Sig Dispense Refill  . acetaminophen (TYLENOL) 325 MG tablet Take 650 mg by mouth every 6 (six) hours as needed for mild pain or moderate pain.    Marland Kitchen amLODipine (NORVASC) 10 MG tablet TAKE 1 TABLET BY MOUTH EVERY DAY (Patient taking differently: Take 10 mg by mouth daily.) 90 tablet 1  . atorvastatin (LIPITOR) 20 MG tablet TAKE 1 TABLET BY MOUTH EVERY DAY (Patient taking differently: Take 20 mg by mouth daily.) 90 tablet 1  . Blood Glucose Monitoring Suppl (ACCU-CHEK AVIVA PLUS) w/Device KIT Used to check blood glucose 2 times a day  or PRN (Patient taking differently: 1 each by Other route See admin instructions. Used to check blood glucose 2 times a day or PRN) 1 kit 0  . cholecalciferol (VITAMIN D3) 25 MCG (1000 UNIT) tablet Take 1,000 Units by mouth daily.    . cloNIDine (CATAPRES) 0.3 MG tablet Take 1 tablet (0.3 mg total) by mouth 3 (three) times daily. 60 tablet 0  . clopidogrel (PLAVIX) 75 MG tablet TAKE 1 TABLET BY MOUTH EVERY DAY (Patient taking differently: Take 75 mg by mouth daily.) 90 tablet 3  . doxazosin (CARDURA) 2 MG tablet Take 2 mg by mouth at bedtime.   3  . esomeprazole (NEXIUM) 40 MG capsule TAKE 1 CAPSULE BY MOUTH EVERY DAY (Patient taking differently: Take 40 mg by mouth daily. TAKE 1 CAPSULE BY MOUTH EVERY DAY) 90 capsule 3  . glipiZIDE (GLUCOTROL XL) 5 MG 24 hr tablet Take 1 tablet (5 mg total) by mouth daily. Need diabetic follow up for further refills 30 tablet 0  . glucose blood (ACCU-CHEK AVIVA PLUS) test strip Used to check blood glucose BID or PRN (Patient taking differently: 1 each by Other route See admin instructions. Used to check blood glucose BID or PRN) 100 each 12  . hydrALAZINE (APRESOLINE)  100 MG tablet Take 1 tablet (100 mg total) by mouth 3 (three) times daily. 90 tablet 0  . hydrocortisone (ANUSOL-HC) 2.5 % rectal cream PLACE 1 APPLICATION RECTALLY 2 TIMES A DAY. (Patient taking differently: Place 1 application rectally 2 (two) times daily.) 30 g 0  . isosorbide dinitrate (ISORDIL) 20 MG tablet Take 1 tablet (20 mg total) by mouth 3 (three) times daily. 90 tablet 3  . KLOR-CON M10 10 MEQ tablet Take 2 tablets (20 mEq total) by mouth 2 (two) times daily. 120 tablet 0  . lactulose (CHRONULAC) 10 GM/15ML solution TAKE 15 ML BY MOUTH 2 TIMES DAILY AS NEEDED FOR MILD CONSTIPATION. (Patient taking differently: Take 10 g by mouth 2 (two) times daily as needed for mild constipation.) 946 mL 3  . Lancets (ACCU-CHEK MULTICLIX) lancets Used to check blood glucose BID or PRN (Patient taking differently: 1 each by Other route See admin instructions. Used to check blood glucose BID or PRN) 100 each 12  . losartan (COZAAR) 25 MG tablet Take 12.5 mg by mouth daily.    . metolazone (ZAROXOLYN) 2.5 MG tablet Take 1 tablet (2.5 mg total) by mouth daily. 30 tablet 0  . metoprolol succinate (TOPROL-XL) 100 MG 24 hr tablet Take 1 tablet (100 mg total) by mouth daily. Take with or immediately following a meal. 30 tablet 0  . POLY-IRON 150 150 MG capsule Take 150 mg by mouth daily.    Marland Kitchen Propylene Glycol (SYSTANE BALANCE OP) Place 1 drop into both eyes daily as needed.    . torsemide (DEMADEX) 20 MG tablet Take 1 tablet (20 mg total) by mouth 2 (two) times daily. 60 tablet 0  . triamcinolone cream (KENALOG) 0.1 % Apply 1 application topically 2 (two) times daily.     Current Facility-Administered Medications on File Prior to Visit  Medication Dose Route Frequency Provider Last Rate Last Admin  . ammonium lactate (LAC-HYDRIN) 12 % lotion   Topical PRN Felipa Furnace, DPM        There were no vitals taken for this visit.      Objective:   Physical Exam Vitals and nursing note reviewed.   Constitutional:      General: He is not in acute  distress.    Appearance: Normal appearance. He is well-developed and normal weight.  HENT:     Head: Normocephalic and atraumatic.     Right Ear: Tympanic membrane, ear canal and external ear normal. There is no impacted cerumen.     Left Ear: Tympanic membrane, ear canal and external ear normal. There is no impacted cerumen.     Nose: Nose normal. No congestion or rhinorrhea.     Mouth/Throat:     Mouth: Mucous membranes are moist.     Pharynx: Oropharynx is clear. No oropharyngeal exudate or posterior oropharyngeal erythema.  Eyes:     General:        Right eye: No discharge.        Left eye: No discharge.     Extraocular Movements: Extraocular movements intact.     Conjunctiva/sclera: Conjunctivae normal.     Pupils: Pupils are equal, round, and reactive to light.  Neck:     Vascular: No carotid bruit.     Trachea: No tracheal deviation.  Cardiovascular:     Rate and Rhythm: Normal rate and regular rhythm.     Pulses: Normal pulses.     Heart sounds: Normal heart sounds. No murmur heard. No friction rub. No gallop.   Pulmonary:     Effort: Pulmonary effort is normal. No respiratory distress.     Breath sounds: Normal breath sounds. No stridor. No wheezing, rhonchi or rales.  Chest:     Chest wall: No tenderness.  Abdominal:     General: Bowel sounds are normal. There is no distension.     Palpations: Abdomen is soft. There is no mass.     Tenderness: There is no abdominal tenderness. There is no right CVA tenderness, left CVA tenderness, guarding or rebound.     Hernia: No hernia is present.  Musculoskeletal:        General: No swelling, tenderness, deformity or signs of injury. Normal range of motion.     Right lower leg: No edema.     Left lower leg: No edema.  Lymphadenopathy:     Cervical: No cervical adenopathy.  Skin:    General: Skin is warm and dry.     Capillary Refill: Capillary refill takes less than 2  seconds.     Coloration: Skin is not jaundiced or pale.     Findings: No bruising, erythema, lesion or rash.  Neurological:     General: No focal deficit present.     Mental Status: He is alert and oriented to person, place, and time.     Cranial Nerves: No cranial nerve deficit.     Sensory: No sensory deficit.     Motor: No weakness.     Coordination: Coordination normal.     Gait: Gait normal.     Deep Tendon Reflexes: Reflexes normal.  Psychiatric:        Mood and Affect: Mood normal.        Behavior: Behavior normal.        Thought Content: Thought content normal.        Judgment: Judgment normal.        Assessment & Plan:  1. Acute on chronic diastolic congestive heart failure [facility]) - Reviewed hospital notes, labs, imaging, and discharge instructions.  - All questions answered to the best of my ability.  - Follow up with Cardiology, Nephrology,and Pulmonary as directed - Pay attention to fluid restriction and no sodium diet  - Basic Metabolic Panel; Future  2.  Essential hypertension - Better controlled.  - Basic Metabolic Panel; Future  3. CKD (chronic kidney disease), stage IV ([place]) - Basic Metabolic Panel; Future  4. Type 2 diabetes mellitus with stage 4 chronic kidney disease, without long-term current use of insulin ([place]) - Continue with outpatient treatment  - Basic Metabolic Panel; Future  5. Hypokalemia - Continue with potassium supplements - Basic Metabolic Panel; Future  6. Slow transit constipation - Continue with OTC medication   7. Hypertensive urgency - Better controlled.  - Basic Metabolic Panel; Future  8. Mass of right lung  - Ambulatory referral to Pulmonology  Dorothyann Peng, NP

## 2020-09-25 ENCOUNTER — Other Ambulatory Visit: Payer: Self-pay

## 2020-09-25 ENCOUNTER — Encounter: Payer: Self-pay | Admitting: Podiatry

## 2020-09-25 ENCOUNTER — Ambulatory Visit: Payer: Medicare PPO | Admitting: Podiatry

## 2020-09-25 DIAGNOSIS — E0821 Diabetes mellitus due to underlying condition with diabetic nephropathy: Secondary | ICD-10-CM

## 2020-09-25 DIAGNOSIS — B351 Tinea unguium: Secondary | ICD-10-CM | POA: Diagnosis not present

## 2020-09-25 DIAGNOSIS — M79674 Pain in right toe(s): Secondary | ICD-10-CM

## 2020-09-25 DIAGNOSIS — M79675 Pain in left toe(s): Secondary | ICD-10-CM | POA: Diagnosis not present

## 2020-09-25 NOTE — Progress Notes (Signed)
  Subjective:  Patient ID: Austin [name], male    DOB: [birth year],  MRN: [id number]  Chief Complaint  Patient presents with  . routine foot care    Nail trim    86 y.o. male returns for the above complaint.  Patient presents with thickened elongated mycotic dystrophic toenails x10.  Pain there is pain associated with ambulation.  There is some curvature to the nail.  Patient is a diabetic with last A1c of 6.8.  Patient is currently taking Plavix.  Patient well-controlled diabetic.  He does not have any secondary complaints  Objective:   There were no vitals filed for this visit. Podiatric Exam: Vascular: dorsalis pedis and posterior tibial pulses are palpable bilateral. Capillary return is immediate. Temperature gradient is WNL. Skin turgor WNL  Sensorium: Slightly decreased Semmes Weinstein monofilament test.  Slightly decreased tactile sensation bilaterally. Nail Exam: Pt has thick disfigured discolored nails with subungual debris noted bilateral entire nail hallux through fifth toenails Ulcer Exam: There is no evidence of ulcer or pre-ulcerative changes or infection. Orthopedic Exam: Muscle tone and strength are WNL. No limitations in general ROM. No crepitus or effusions noted. HAV  B/L.  Hammer toes 2-5  B/L. Skin: No Porokeratosis. No infection or ulcers  Assessment & Plan:  Patient was evaluated and treated and all questions answered.  -Xerosis -I explained to the patient the etiology of xerosis and various treatment options were extensively discussed.  I explained to the patient the importance of maintaining moisturization of the skin with application of over-the-counter lotion such as Eucerin or Luciderm.  At this point given that he has failed over-the-counter lotion he will benefit from ammonium lactate.  I have asked him to apply twice a day.  Patient states understanding  Neuropathic pain -I explained the patient the etiology of neuropathic pain and various treatment options were  discussed.  Ultimately I discussed with the patient the importance of glucose management which will ultimately help with the pain.  For now patient does not want to take any medications and ultimately will discuss diet control to help with the pain.  Patient states understanding.  Onychomycosis with pain  -Nails palliatively debrided as below. -Educated on self-care  Procedure: Nail Debridement Rationale: pain  Type of Debridement: manual, sharp debridement. Instrumentation: Nail nipper, rotary burr. Number of Nails: 10  Procedures and Treatment: Consent by patient was obtained for treatment procedures. The patient understood the discussion of treatment and procedures well. All questions were answered thoroughly reviewed. Debridement of mycotic and hypertrophic toenails, 1 through 5 bilateral and clearing of subungual debris. No ulceration, no infection noted.  Return Visit-Office Procedure: Patient instructed to return to the office for a follow up visit 3 months for continued evaluation and treatment.  Boneta Lucks, DPM    No follow-ups on file.

## 2020-10-09 ENCOUNTER — Encounter (HOSPITAL_COMMUNITY): Payer: Self-pay | Admitting: Emergency Medicine

## 2020-10-09 ENCOUNTER — Other Ambulatory Visit: Payer: Self-pay

## 2020-10-09 ENCOUNTER — Inpatient Hospital Stay (HOSPITAL_COMMUNITY)
Admission: EM | Admit: 2020-10-09 | Discharge: 2020-10-12 | DRG: 683 | Disposition: A | Discharge status: Home / Self Care | Payer: Medicare PPO | Attending: Family Medicine | Admitting: Family Medicine

## 2020-10-09 DIAGNOSIS — Z79899 Other long term (current) drug therapy: Secondary | ICD-10-CM | POA: Diagnosis not present

## 2020-10-09 DIAGNOSIS — Z800 Family history of malignant neoplasm of digestive organs: Secondary | ICD-10-CM

## 2020-10-09 DIAGNOSIS — N184 Chronic kidney disease, stage 4 (severe): Secondary | ICD-10-CM | POA: Diagnosis present

## 2020-10-09 DIAGNOSIS — I130 Hypertensive heart and chronic kidney disease with heart failure and stage 1 through stage 4 chronic kidney disease, or unspecified chronic kidney disease: Secondary | ICD-10-CM | POA: Diagnosis present

## 2020-10-09 DIAGNOSIS — Z87891 Personal history of nicotine dependence: Secondary | ICD-10-CM | POA: Diagnosis not present

## 2020-10-09 DIAGNOSIS — Z886 Allergy status to analgesic agent status: Secondary | ICD-10-CM

## 2020-10-09 DIAGNOSIS — E876 Hypokalemia: Secondary | ICD-10-CM | POA: Diagnosis present

## 2020-10-09 DIAGNOSIS — T502X5A Adverse effect of carbonic-anhydrase inhibitors, benzothiadiazides and other diuretics, initial encounter: Secondary | ICD-10-CM | POA: Diagnosis present

## 2020-10-09 DIAGNOSIS — L853 Xerosis cutis: Secondary | ICD-10-CM | POA: Diagnosis present

## 2020-10-09 DIAGNOSIS — I5032 Chronic diastolic (congestive) heart failure: Secondary | ICD-10-CM | POA: Diagnosis present

## 2020-10-09 DIAGNOSIS — N185 Chronic kidney disease, stage 5: Secondary | ICD-10-CM

## 2020-10-09 DIAGNOSIS — K219 Gastro-esophageal reflux disease without esophagitis: Secondary | ICD-10-CM | POA: Diagnosis present

## 2020-10-09 DIAGNOSIS — E1169 Type 2 diabetes mellitus with other specified complication: Secondary | ICD-10-CM | POA: Diagnosis present

## 2020-10-09 DIAGNOSIS — Z8249 Family history of ischemic heart disease and other diseases of the circulatory system: Secondary | ICD-10-CM

## 2020-10-09 DIAGNOSIS — Z8673 Personal history of transient ischemic attack (TIA), and cerebral infarction without residual deficits: Secondary | ICD-10-CM

## 2020-10-09 DIAGNOSIS — N189 Chronic kidney disease, unspecified: Secondary | ICD-10-CM | POA: Diagnosis present

## 2020-10-09 DIAGNOSIS — I10 Essential (primary) hypertension: Secondary | ICD-10-CM | POA: Diagnosis not present

## 2020-10-09 DIAGNOSIS — E785 Hyperlipidemia, unspecified: Secondary | ICD-10-CM | POA: Diagnosis present

## 2020-10-09 DIAGNOSIS — E860 Dehydration: Secondary | ICD-10-CM | POA: Diagnosis present

## 2020-10-09 DIAGNOSIS — K5900 Constipation, unspecified: Secondary | ICD-10-CM | POA: Diagnosis not present

## 2020-10-09 DIAGNOSIS — R768 Other specified abnormal immunological findings in serum: Secondary | ICD-10-CM | POA: Diagnosis present

## 2020-10-09 DIAGNOSIS — E1129 Type 2 diabetes mellitus with other diabetic kidney complication: Secondary | ICD-10-CM | POA: Diagnosis present

## 2020-10-09 DIAGNOSIS — Z7984 Long term (current) use of oral hypoglycemic drugs: Secondary | ICD-10-CM

## 2020-10-09 DIAGNOSIS — K921 Melena: Secondary | ICD-10-CM | POA: Diagnosis present

## 2020-10-09 DIAGNOSIS — Z833 Family history of diabetes mellitus: Secondary | ICD-10-CM

## 2020-10-09 DIAGNOSIS — Z8546 Personal history of malignant neoplasm of prostate: Secondary | ICD-10-CM | POA: Diagnosis not present

## 2020-10-09 DIAGNOSIS — Z20822 Contact with and (suspected) exposure to covid-19: Secondary | ICD-10-CM | POA: Diagnosis present

## 2020-10-09 DIAGNOSIS — E1122 Type 2 diabetes mellitus with diabetic chronic kidney disease: Secondary | ICD-10-CM | POA: Diagnosis present

## 2020-10-09 DIAGNOSIS — Z7902 Long term (current) use of antithrombotics/antiplatelets: Secondary | ICD-10-CM | POA: Diagnosis not present

## 2020-10-09 DIAGNOSIS — R918 Other nonspecific abnormal finding of lung field: Secondary | ICD-10-CM | POA: Diagnosis present

## 2020-10-09 DIAGNOSIS — N179 Acute kidney failure, unspecified: Principal | ICD-10-CM | POA: Diagnosis present

## 2020-10-09 LAB — SARS CORONAVIRUS 2 (TAT 6-24 HRS): SARS Coronavirus 2: NEGATIVE

## 2020-10-09 LAB — CBC WITH DIFFERENTIAL/PLATELET
Abs Immature Granulocytes: 0.03 10*3/uL (ref 0.00–0.07)
Basophils Absolute: 0 10*3/uL (ref 0.0–0.1)
Basophils Relative: 1 %
Eosinophils Absolute: 0.1 10*3/uL (ref 0.0–0.5)
Eosinophils Relative: 1 %
HCT: 33.4 % — ABNORMAL LOW (ref 39.0–52.0)
Hemoglobin: 10.8 g/dL — ABNORMAL LOW (ref 13.0–17.0)
Immature Granulocytes: 0 %
Lymphocytes Relative: 14 %
Lymphs Abs: 1.1 10*3/uL (ref 0.7–4.0)
MCH: 26.5 pg (ref 26.0–34.0)
MCHC: 32.3 g/dL (ref 30.0–36.0)
MCV: 82.1 fL (ref 80.0–100.0)
Monocytes Absolute: 0.5 10*3/uL (ref 0.1–1.0)
Monocytes Relative: 6 %
Neutro Abs: 6.6 10*3/uL (ref 1.7–7.7)
Neutrophils Relative %: 78 %
Platelets: 325 10*3/uL (ref 150–400)
RBC: 4.07 MIL/uL — ABNORMAL LOW (ref 4.22–5.81)
RDW: 13.1 % (ref 11.5–15.5)
WBC: 8.4 10*3/uL (ref 4.0–10.5)
nRBC: 0 % (ref 0.0–0.2)

## 2020-10-09 LAB — CBC
HCT: 30.9 % — ABNORMAL LOW (ref 39.0–52.0)
Hemoglobin: 10.3 g/dL — ABNORMAL LOW (ref 13.0–17.0)
MCH: 27.4 pg (ref 26.0–34.0)
MCHC: 33.3 g/dL (ref 30.0–36.0)
MCV: 82.2 fL (ref 80.0–100.0)
Platelets: 287 10*3/uL (ref 150–400)
RBC: 3.76 MIL/uL — ABNORMAL LOW (ref 4.22–5.81)
RDW: 13.1 % (ref 11.5–15.5)
WBC: 7.6 10*3/uL (ref 4.0–10.5)
nRBC: 0 % (ref 0.0–0.2)

## 2020-10-09 LAB — BASIC METABOLIC PANEL
Anion gap: 14 (ref 5–15)
BUN: 130 mg/dL — ABNORMAL HIGH (ref 8–23)
CO2: 26 mmol/L (ref 22–32)
Calcium: 9 mg/dL (ref 8.9–10.3)
Chloride: 97 mmol/L — ABNORMAL LOW (ref 98–111)
Creatinine, Ser: 5.38 mg/dL — ABNORMAL HIGH (ref 0.61–1.24)
GFR, Estimated: 10 mL/min — ABNORMAL LOW (ref >60)
Glucose, Bld: 175 mg/dL — ABNORMAL HIGH (ref 70–99)
Potassium: 2.9 mmol/L — ABNORMAL LOW (ref 3.5–5.1)
Sodium: 137 mmol/L (ref 135–145)

## 2020-10-09 LAB — COMPREHENSIVE METABOLIC PANEL
ALT: 11 U/L (ref 0–44)
AST: 14 U/L — ABNORMAL LOW (ref 15–41)
Albumin: 3.8 g/dL (ref 3.5–5.0)
Alkaline Phosphatase: 74 U/L (ref 38–126)
Anion gap: 14 (ref 5–15)
BUN: 137 mg/dL — ABNORMAL HIGH (ref 8–23)
CO2: 27 mmol/L (ref 22–32)
Calcium: 9.4 mg/dL (ref 8.9–10.3)
Chloride: 93 mmol/L — ABNORMAL LOW (ref 98–111)
Creatinine, Ser: 6.24 mg/dL — ABNORMAL HIGH (ref 0.61–1.24)
GFR, Estimated: 8 mL/min — ABNORMAL LOW (ref >60)
Glucose, Bld: 141 mg/dL — ABNORMAL HIGH (ref 70–99)
Potassium: 2.9 mmol/L — ABNORMAL LOW (ref 3.5–5.1)
Sodium: 134 mmol/L — ABNORMAL LOW (ref 135–145)
Total Bilirubin: 0.8 mg/dL (ref 0.3–1.2)
Total Protein: 7.3 g/dL (ref 6.5–8.1)

## 2020-10-09 LAB — URINALYSIS, ROUTINE W REFLEX MICROSCOPIC
Bacteria, UA: NONE SEEN
Bilirubin Urine: NEGATIVE
Glucose, UA: NEGATIVE mg/dL
Hgb urine dipstick: NEGATIVE
Ketones, ur: NEGATIVE mg/dL
Leukocytes,Ua: NEGATIVE
Nitrite: NEGATIVE
Protein, ur: 100 mg/dL — AB
Specific Gravity, Urine: 1.01 (ref 1.005–1.030)
pH: 5 (ref 5.0–8.0)

## 2020-10-09 LAB — MAGNESIUM: Magnesium: 2.5 mg/dL — ABNORMAL HIGH (ref 1.7–2.4)

## 2020-10-09 LAB — CREATININE, SERUM
Creatinine, Ser: 5.86 mg/dL — ABNORMAL HIGH (ref 0.61–1.24)
GFR, Estimated: 9 mL/min — ABNORMAL LOW (ref >60)

## 2020-10-09 LAB — GLUCOSE, CAPILLARY: Glucose-Capillary: 138 mg/dL — ABNORMAL HIGH (ref 70–99)

## 2020-10-09 MED ORDER — LACTULOSE 10 GM/15ML PO SOLN
10.0000 g | ORAL | Status: DC
  Administered 2020-10-09 – 2020-10-11 (×2): 10 g via ORAL
  Filled 2020-10-09 (×2): qty 15

## 2020-10-09 MED ORDER — ACETAMINOPHEN 650 MG RE SUPP: 650.0000 mg | Freq: Four times a day (QID) | RECTAL | Status: DC | PRN

## 2020-10-09 MED ORDER — METOPROLOL SUCCINATE ER 100 MG PO TB24
100.0000 mg | ORAL_TABLET | Freq: Every day | ORAL | Status: DC
  Administered 2020-10-09 – 2020-10-12 (×3): 100 mg via ORAL
  Filled 2020-10-09 (×4): qty 1

## 2020-10-09 MED ORDER — ISOSORB DINITRATE-HYDRALAZINE 20-37.5 MG PO TABS
1.0000 | ORAL_TABLET | Freq: Three times a day (TID) | ORAL | Status: DC
  Administered 2020-10-09 – 2020-10-12 (×10): 1 via ORAL
  Filled 2020-10-09 (×11): qty 1

## 2020-10-09 MED ORDER — POTASSIUM CHLORIDE CRYS ER 20 MEQ PO TBCR
20.0000 meq | EXTENDED_RELEASE_TABLET | Freq: Two times a day (BID) | ORAL | Status: DC
  Administered 2020-10-09: 20 meq via ORAL
  Filled 2020-10-09: qty 1

## 2020-10-09 MED ORDER — SODIUM CHLORIDE 0.9% FLUSH
3.0000 mL | Freq: Two times a day (BID) | INTRAVENOUS | Status: DC
  Administered 2020-10-09 – 2020-10-12 (×6): 3 mL via INTRAVENOUS

## 2020-10-09 MED ORDER — AMMONIUM LACTATE 12 % EX LOTN
TOPICAL_LOTION | CUTANEOUS | Status: DC | PRN
  Filled 2020-10-09: qty 225

## 2020-10-09 MED ORDER — DOXAZOSIN MESYLATE 2 MG PO TABS
2.0000 mg | ORAL_TABLET | Freq: Every day | ORAL | Status: DC
  Administered 2020-10-09 – 2020-10-11 (×3): 2 mg via ORAL
  Filled 2020-10-09 (×3): qty 1

## 2020-10-09 MED ORDER — POLYVINYL ALCOHOL 1.4 % OP SOLN
1.0000 [drp] | OPHTHALMIC | Status: DC | PRN
  Filled 2020-10-09: qty 15

## 2020-10-09 MED ORDER — POLYETHYLENE GLYCOL 3350 17 G PO PACK
17.0000 g | PACK | Freq: Every day | ORAL | Status: DC | PRN
  Administered 2020-10-12: 17 g via ORAL
  Filled 2020-10-09: qty 1

## 2020-10-09 MED ORDER — SODIUM CHLORIDE 0.9% FLUSH: 3.0000 mL | INTRAVENOUS | Status: DC | PRN

## 2020-10-09 MED ORDER — POTASSIUM CHLORIDE CRYS ER 20 MEQ PO TBCR
40.0000 meq | EXTENDED_RELEASE_TABLET | Freq: Once | ORAL | Status: AC
  Administered 2020-10-09: 40 meq via ORAL
  Filled 2020-10-09: qty 2

## 2020-10-09 MED ORDER — PROPYLENE GLYCOL 0.6 % OP SOLN: Freq: Every day | OPHTHALMIC | Status: DC | PRN

## 2020-10-09 MED ORDER — SODIUM CHLORIDE 0.9 % IV SOLN: 250.0000 mL | INTRAVENOUS | Status: DC | PRN

## 2020-10-09 MED ORDER — HEPARIN SODIUM (PORCINE) 5000 UNIT/ML IJ SOLN
5000.0000 [IU] | Freq: Three times a day (TID) | INTRAMUSCULAR | Status: DC
  Administered 2020-10-09 – 2020-10-12 (×8): 5000 [IU] via SUBCUTANEOUS
  Filled 2020-10-09 (×9): qty 1

## 2020-10-09 MED ORDER — AMLODIPINE BESYLATE 10 MG PO TABS
10.0000 mg | ORAL_TABLET | Freq: Every day | ORAL | Status: DC
  Administered 2020-10-09 – 2020-10-12 (×4): 10 mg via ORAL
  Filled 2020-10-09 (×2): qty 1
  Filled 2020-10-09: qty 2
  Filled 2020-10-09: qty 1

## 2020-10-09 MED ORDER — ACETAMINOPHEN 325 MG PO TABS
325.0000 mg | ORAL_TABLET | Freq: Four times a day (QID) | ORAL | Status: DC
  Administered 2020-10-09 – 2020-10-12 (×11): 325 mg via ORAL
  Filled 2020-10-09 (×11): qty 1

## 2020-10-09 MED ORDER — ACETAMINOPHEN 325 MG PO TABS: 650.0000 mg | ORAL_TABLET | Freq: Four times a day (QID) | ORAL | Status: DC | PRN

## 2020-10-09 MED ORDER — ATORVASTATIN CALCIUM 10 MG PO TABS
20.0000 mg | ORAL_TABLET | Freq: Every day | ORAL | Status: DC
  Administered 2020-10-09 – 2020-10-12 (×4): 20 mg via ORAL
  Filled 2020-10-09 (×4): qty 2

## 2020-10-09 MED ORDER — CLONIDINE HCL 0.3 MG PO TABS
0.3000 mg | ORAL_TABLET | Freq: Three times a day (TID) | ORAL | Status: DC
Start: 2020-10-09 — End: 2020-10-12
  Administered 2020-10-09 – 2020-10-12 (×9): 0.3 mg via ORAL
  Filled 2020-10-09 (×9): qty 1

## 2020-10-09 MED ORDER — PANTOPRAZOLE SODIUM 40 MG PO TBEC
80.0000 mg | DELAYED_RELEASE_TABLET | Freq: Every day | ORAL | Status: DC
  Administered 2020-10-10 – 2020-10-12 (×3): 80 mg via ORAL
  Filled 2020-10-09 (×3): qty 2

## 2020-10-09 MED ORDER — POTASSIUM CHLORIDE 10 MEQ/100ML IV SOLN
10.0000 meq | Freq: Once | INTRAVENOUS | Status: AC
  Administered 2020-10-09: 10 meq via INTRAVENOUS
  Filled 2020-10-09: qty 100

## 2020-10-09 MED ORDER — BISACODYL 10 MG RE SUPP
10.0000 mg | Freq: Every day | RECTAL | Status: DC | PRN
  Administered 2020-10-09 – 2020-10-12 (×2): 10 mg via RECTAL
  Filled 2020-10-09 (×2): qty 1

## 2020-10-09 MED ORDER — LACTULOSE 10 GM/15ML PO SOLN
10.0000 g | ORAL | Status: DC
  Filled 2020-10-09: qty 15

## 2020-10-09 MED ORDER — INSULIN ASPART 100 UNIT/ML ~~LOC~~ SOLN
0.0000 [IU] | Freq: Three times a day (TID) | SUBCUTANEOUS | Status: DC
  Administered 2020-10-10 – 2020-10-12 (×3): 1 [IU] via SUBCUTANEOUS

## 2020-10-09 MED ORDER — CLOPIDOGREL BISULFATE 75 MG PO TABS
75.0000 mg | ORAL_TABLET | Freq: Every day | ORAL | Status: DC
  Administered 2020-10-09 – 2020-10-12 (×4): 75 mg via ORAL
  Filled 2020-10-09 (×4): qty 1

## 2020-10-09 NOTE — Progress Notes (Signed)
Patient admitted to [facility]. Patient is alert and oriented, no complaints of any pain. 2 nurse skin assessment done upon admission.

## 2020-10-09 NOTE — ED Provider Notes (Signed)
[facility] EMERGENCY DEPARTMENT Provider Note   CSN: [id number] Arrival date & time: 10/09/20  1018     History Chief Complaint  Patient presents with  . Abnormal Lab    Austin [name] is a 86 y.o. male.  The history is provided by the patient and medical records. No language interpreter was used.  Abnormal Lab    86 year old male significant history of chronic kidney disease, stage IV, anemia, diabetes, hypertension, hypokalemia presenting to the ED today due to abnormal labs.  Patient report of approximately 2 to 3 weeks ago he was admitted to the hospital due to shortness of breath.  He was found to be fluid overloaded and was started on several diuretic medication.  He subsequently was discharged home.  He did went to get his labs drawn yesterday for a scheduled lab checkup.  He was contacted by his doctor today with recommendation to come to the ER because his kidney function is impaired.  Patient states he does not have any true symptoms.  He does endorse some generalized weakness but otherwise still making urine and denies any active pain.  No fever chills no runny nose sneezing or coughing no chest pain or shortness of breath.  He did report improvement of his leg swelling while on diuretic.  He has been fully vaccinated for COVID-19.   Past Medical History:  Diagnosis Date  . 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 CAROTID ARTERY STENOSIS 05/10/2010  . CHF (congestive heart failure) ([place])   . DIABETES MELLITUS, TYPE II 09/19/2007  . DISEASE, CEREBROVASCULAR NEC 03/05/2007  . GERD 03/13/2007  . HYPERLIPIDEMIA 03/05/2007  . HYPERTENSION 03/05/2007  . HYPOKALEMIA 11/09/2009  . KNEE PAIN, RIGHT 11/09/2009  . PROSTATE CANCER, HX OF 03/05/2007  . RENAL DISEASE, CHRONIC 02/03/2009    Patient Active Problem List   Diagnosis Date Noted  . Mass of right lung 09/18/2020  . Acute diastolic CHF (congestive heart failure) ([place]) 09/17/2020  . Acute CHF (congestive  heart failure) ([place]) 02/23/2020  . Acute exacerbation of CHF (congestive heart failure) ([place]) 08/04/2019  . CKD (chronic kidney disease), stage IV ([place]) 08/04/2019  . Elevated troponin 08/04/2019  . Hypokalemia 08/04/2019  . Hypertensive urgency 03/28/2019  . CKD (chronic kidney disease), stage III ([place]) 03/28/2019  . 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 Gastrointestinal hemorrhage with melena   . Melena 11/03/2016  . Carotid artery stenosis 05/10/2010  . Diabetes mellitus with renal complications ([place]) 81/19/1478  . Iron deficiency anemia due to chronic blood loss 03/13/2007  . GERD 03/13/2007  . Dyslipidemia 03/05/2007  . Essential hypertension 03/05/2007  . DISEASE, CEREBROVASCULAR NEC 03/05/2007  . PROSTATE CANCER, HX OF 03/05/2007    Past Surgical History:  Procedure Laterality Date  . CAROTID ARTERY ANGIOPLASTY Right Oct. 10, 2001  . ESOPHAGOGASTRODUODENOSCOPY (EGD) WITH PROPOFOL N/A 11/04/2016   Procedure: ESOPHAGOGASTRODUODENOSCOPY (EGD) WITH PROPOFOL;  Surgeon: Otis Brace, MD;  Location: [facility];  Service: Gastroenterology;  Laterality: N/A;  . LAPAROSCOPIC APPENDECTOMY  02/20/2012   Procedure: APPENDECTOMY LAPAROSCOPIC;  Surgeon: Stark Klein, MD;  Location: [facility] OR;  Service: General;  Laterality: N/A;  . PROSTATE SURGERY     prostatectomy       Family History  Problem Relation Age of Onset  . Hypertension Mother   . Cancer Father        Mesothelioma   . Stomach cancer Brother   . Cancer Brother   . Cancer - Cervical Brother   .  Cancer Brother   . Diabetes Brother   . Esophageal cancer Neg Hx   . Colon cancer Neg Hx   . Pancreatic cancer Neg Hx     Social History   Tobacco Use  . Smoking status: Former Smoker    Types: Cigarettes    Quit date: 08/22/1974    Years since quitting: 46.1  . Smokeless tobacco: Never Used  Vaping Use  . Vaping Use: Never used  Substance Use Topics  . Alcohol use: No    Alcohol/week: 0.0  standard drinks  . Drug use: No    Home Medications Prior to Admission medications   Medication Sig Start Date End Date Taking? Authorizing Provider  acetaminophen (TYLENOL) 325 MG tablet Take 650 mg by mouth every 6 (six) hours as needed for mild pain or moderate pain.    [provider]  amLODipine (NORVASC) 10 MG tablet TAKE 1 TABLET BY MOUTH EVERY DAY Patient taking differently: Take 10 mg by mouth daily. 06/29/20   Nafziger, Tommi Rumps, NP  atorvastatin (LIPITOR) 20 MG tablet TAKE 1 TABLET BY MOUTH EVERY DAY Patient taking differently: Take 20 mg by mouth daily. 06/29/20   Nafziger, Tommi Rumps, NP  Blood Glucose Monitoring Suppl (ACCU-CHEK AVIVA PLUS) w/Device KIT Used to check blood glucose 2 times a day or PRN Patient taking differently: 1 each by Other route See admin instructions. Used to check blood glucose 2 times a day or PRN 09/04/19   Nafziger, Tommi Rumps, NP  cholecalciferol (VITAMIN D3) 25 MCG (1000 UNIT) tablet Take 1,000 Units by mouth daily.    [provider]  cloNIDine (CATAPRES) 0.3 MG tablet Take 1 tablet (0.3 mg total) by mouth 3 (three) times daily. 09/20/20   Harold Hedge, MD  clopidogrel (PLAVIX) 75 MG tablet TAKE 1 TABLET BY MOUTH EVERY DAY Patient taking differently: Take 75 mg by mouth daily. 05/20/20   Nafziger, Tommi Rumps, NP  doxazosin (CARDURA) 2 MG tablet Take 2 mg by mouth at bedtime.  12/28/17   [provider]  esomeprazole (NEXIUM) 40 MG capsule TAKE 1 CAPSULE BY MOUTH EVERY DAY Patient taking differently: Take 40 mg by mouth daily. TAKE 1 CAPSULE BY MOUTH EVERY DAY 05/20/20   Nafziger, Tommi Rumps, NP  glipiZIDE (GLUCOTROL XL) 5 MG 24 hr tablet Take 1 tablet (5 mg total) by mouth daily. Need diabetic follow up for further refills 06/24/20   Nafziger, Tommi Rumps, NP  glucose blood (ACCU-CHEK AVIVA PLUS) test strip Used to check blood glucose BID or PRN Patient taking differently: 1 each by Other route See admin instructions. Used to check blood glucose BID or PRN 09/04/19    Dorothyann Peng, NP  hydrALAZINE (APRESOLINE) 100 MG tablet Take 1 tablet (100 mg total) by mouth 3 (three) times daily. 09/20/20 10/20/20  Harold Hedge, MD  hydrocortisone (ANUSOL-HC) 2.5 % rectal cream PLACE 1 APPLICATION RECTALLY 2 TIMES A DAY. Patient taking differently: Place 1 application rectally 2 (two) times daily. 08/28/20   Nafziger, Tommi Rumps, NP  isosorbide dinitrate (ISORDIL) 20 MG tablet Take 1 tablet (20 mg total) by mouth 3 (three) times daily. 08/28/20   Nafziger, Tommi Rumps, NP  KLOR-CON M10 10 MEQ tablet Take 2 tablets (20 mEq total) by mouth 2 (two) times daily. 09/20/20 10/20/20  Harold Hedge, MD  lactulose ([place]ath) 10 GM/15ML solution TAKE 15 ML BY MOUTH 2 TIMES DAILY AS NEEDED FOR MILD CONSTIPATION. Patient taking differently: Take 10 g by mouth 2 (two) times daily as needed for mild constipation. 06/16/20  Nafziger, Tommi Rumps, NP  Lancets (ACCU-CHEK MULTICLIX) lancets Used to check blood glucose BID or PRN Patient taking differently: 1 each by Other route See admin instructions. Used to check blood glucose BID or PRN 09/04/19   Dorothyann Peng, NP  losartan (COZAAR) 25 MG tablet Take 12.5 mg by mouth daily. 05/20/20   [provider]  metolazone (ZAROXOLYN) 2.5 MG tablet Take 1 tablet (2.5 mg total) by mouth daily. 09/21/20 10/21/20  Harold Hedge, MD  metoprolol succinate (TOPROL-XL) 100 MG 24 hr tablet Take 1 tablet (100 mg total) by mouth daily. Take with or immediately following a meal. 09/21/20 10/21/20  Harold Hedge, MD  POLY-IRON 150 150 MG capsule Take 150 mg by mouth daily. 01/27/20   [provider]  Propylene Glycol (SYSTANE BALANCE OP) Place 1 drop into both eyes daily as needed.    [provider]  torsemide (DEMADEX) 20 MG tablet Take 1 tablet (20 mg total) by mouth 2 (two) times daily. 09/20/20 10/20/20  Harold Hedge, MD  triamcinolone cream (KENALOG) 0.1 % Apply 1 application topically 2 (two) times daily. 07/05/19   [provider]    Allergies     Aspirin  Review of Systems   Review of Systems  All other systems reviewed and are negative.   Physical Exam Updated Vital Signs BP (!) 177/72 (BP Location: Left Arm)   Pulse 63   Temp 98.4 F (36.9 C) (Oral)   Resp 14   SpO2 98%   Physical Exam Vitals and nursing note reviewed.  Constitutional:      General: He is not in acute distress.    Appearance: He is well-developed and well-nourished.  HENT:     Head: Atraumatic.  Eyes:     Conjunctiva/sclera: Conjunctivae normal.  Cardiovascular:     Rate and Rhythm: Normal rate and regular rhythm.     Pulses: Normal pulses.     Heart sounds: Normal heart sounds.  Pulmonary:     Effort: Pulmonary effort is normal.     Breath sounds: Normal breath sounds.  Abdominal:     Palpations: Abdomen is soft.     Tenderness: There is no abdominal tenderness.  Musculoskeletal:     Cervical back: Neck supple.     Comments: Trace peripheral edema noted to bilateral lower extremities   Skin:    Findings: No rash.  Neurological:     Mental Status: He is alert and oriented to person, place, and time.  Psychiatric:        Mood and Affect: Mood and affect and mood normal.     ED Results / Procedures / Treatments   Labs (all labs ordered are listed, but only abnormal results are displayed) Labs Reviewed  COMPREHENSIVE METABOLIC PANEL - Abnormal; Notable for the following components:      Result Value   Sodium 134 (*)    Potassium 2.9 (*)    Chloride 93 (*)    Glucose, Bld 141 (*)    BUN 137 (*)    Creatinine, Ser 6.24 (*)    AST 14 (*)    GFR, Estimated 8 (*)    All other components within normal limits  CBC WITH DIFFERENTIAL/PLATELET - Abnormal; Notable for the following components:   RBC 4.07 (*)    Hemoglobin 10.8 (*)    HCT 33.4 (*)    All other components within normal limits  SARS CORONAVIRUS 2 (TAT 6-24 HRS)  MAGNESIUM    EKG EKG Interpretation  Date/Time:  Friday October 09 2020 12:03:59 EST Ventricular Rate:   53 PR Interval:    QRS Duration: 104 QT Interval:  467 QTC Calculation: 439 R Axis:   -19 Text Interpretation: Sinus rhythm Prolonged PR interval LVH with secondary repolarization abnormality No significant change since last tracing Confirmed by Nanda Quinton [phone]) on 10/09/2020 1:17:35 PM   Radiology No results found.  Procedures Procedures   Medications Ordered in ED Medications  potassium chloride SA (KLOR-CON) CR tablet 40 mEq (40 mEq Oral Given 10/09/20 1210)  potassium chloride 10 mEq in 100 mL IVPB (10 mEq Intravenous New Bag/Given 10/09/20 1214)    ED Course  I have reviewed the triage vital signs and the nursing notes.  Pertinent labs & imaging results that were available during my care of the patient were reviewed by me and considered in my medical decision making (see chart for details).    MDM Rules/Calculators/A&P                          BP (!) 177/61   Pulse (!) 51   Temp 98.4 F (36.9 C) (Oral)   Resp 15   SpO2 99%   Final Clinical Impression(s) / ED Diagnoses Final diagnoses:  Acute renal failure, unspecified acute renal failure type ([place])  Hypokalemia    Rx / DC Orders ED Discharge Orders    None     11:59 AM Patient previously admitted for CHF exacerbation several weeks ago discharged home with diuretic and had a routine blood work done yesterday which shows worsening renal function.  This is likely secondary to medication causing acute on chronic kidney disease.  Patient otherwise well-appearing, denies any active pain.  Does endorse mild fatigue.  Potassium is low at 2.9, will give supplementation.  Currently BUN 137, creatinine 6.24.  His baseline is usually a creatinine in the 3s range.  Current medication that may contribute to impaired renal function include torsemide, metolazone, metoprolol, amlodipine, clonidine, hydralazine.  12:58 PM Appreciate consultation from nephrology, Dr. Posey Pronto, who report his team will be involved in patient  care.  Will consult medicine for admission.  1:13 PM Appreciate consultation from [facility] Dr. Jamse Arn, who agrees to see and will admit pt for further management of his condition.    Domenic Moras, PA-C 10/09/20 [place], [facility], MD 10/10/20 [phone]

## 2020-10-09 NOTE — ED Triage Notes (Signed)
Patient states he had blood work done yesterday and was called and told to decline in kidney function he should come to the ER. He states no complaints or pain. Reports urine output has not changed and not noticed in discoloration.

## 2020-10-09 NOTE — ED Notes (Signed)
Tap water enema administered. Pt produced 4 very small bowel movements.

## 2020-10-09 NOTE — H&P (Signed)
History and Physical:    Austin [name]   OEU:[id number] DOB: 10-28-1934 DOA: 10/09/2020  Referring MD/provider: Domenic Moras, PA PCP: Dorothyann Peng, NP   Patient coming from: Home  Chief Complaint: "I was told to come in by my kidney doctor".  History of Present Illness:   Austin [name] is an 86 y.o. male 55-year-old male with PMH significant for HTN, HFpEF, DM 2, CKD 4, HL was discharged 2 weeks ago after treatment for acute exacerbation of HFpEF.  Decompensation was thought to be secondary to noncompliance with Lasix.  Patient was diuresed, Lasix was changed to torsemide and Zaroxolyn was added.  Patient was noted to have worsening of renal function while in house from baseline creatinine of 3.0 up to 4.2.  Patient was deemed okay to be discharged by nephrology and follow-up as an outpatient.  Patient was seen by nephrology yesterday and his routine labs noted creatinine of 6.2 and he was sent to the ED for admission.  Patient himself says that he feels sluggish but it is not really terribly different from how he felt 2 weeks ago.  Denies any shortness of breath or DOE.  Notes no further lower extremity edema.  Has been taking his torsemide as prescribed.  Notes he is still urinating a lot "because I am on that diuretic".  Patient denies nausea or vomiting.  No pruritus.  Has some mild anorexia.  ED Course:  The patient was noted to be hypertensive but appeared comfortable.  Laboratory data were notable for potassium of 2.9 and a creatinine of 6.24.  Patient was discussed with nephrology Dr. Posey Pronto who recommended admission with follow-up by nephrology.  ROS:   ROS   Review of Systems: General: Denies fever, chills, malaise,  Respiratory: Denies cough, SOB at rest or hemoptysis Cardiovascular: Denies chest pain or palpitations GI: Denies nausea, vomiting, diarrhea or constipation GU: Denies dysuria, frequency or hematuria CNS: Denies HA, dizziness, confusion, new weakness or  clumsiness. Mood/affect: Denies anxiety/depression    Past Medical History:   Past Medical History:  Diagnosis Date  . 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 CAROTID ARTERY STENOSIS 05/10/2010  . CHF (congestive heart failure) ([place])   . DIABETES MELLITUS, TYPE II 09/19/2007  . DISEASE, CEREBROVASCULAR NEC 03/05/2007  . GERD 03/13/2007  . HYPERLIPIDEMIA 03/05/2007  . HYPERTENSION 03/05/2007  . HYPOKALEMIA 11/09/2009  . KNEE PAIN, RIGHT 11/09/2009  . PROSTATE CANCER, HX OF 03/05/2007  . RENAL DISEASE, CHRONIC 02/03/2009    Past Surgical History:   Past Surgical History:  Procedure Laterality Date  . CAROTID ARTERY ANGIOPLASTY Right Oct. 10, 2001  . ESOPHAGOGASTRODUODENOSCOPY (EGD) WITH PROPOFOL N/A 11/04/2016   Procedure: ESOPHAGOGASTRODUODENOSCOPY (EGD) WITH PROPOFOL;  Surgeon: Otis Brace, MD;  Location: [place];  Service: Gastroenterology;  Laterality: N/A;  . LAPAROSCOPIC APPENDECTOMY  02/20/2012   Procedure: APPENDECTOMY LAPAROSCOPIC;  Surgeon: Stark Klein, MD;  Location: [facility];  Service: General;  Laterality: N/A;  . PROSTATE SURGERY     prostatectomy    Social History:   Social History   Socioeconomic History  . Marital status: Married    Spouse name: Not on file  . Number of children: Not on file  . Years of education: Not on file  . Highest education level: Not on file  Occupational History  . Not on file  Tobacco Use  . Smoking status: Former Smoker    Types: Cigarettes    Quit date: 08/22/1974    Years since quitting: 46.1  .  Smokeless tobacco: Never Used  Vaping Use  . Vaping Use: Never used  Substance and Sexual Activity  . Alcohol use: No    Alcohol/week: 0.0 standard drinks  . Drug use: No  . Sexual activity: Not on file  Other Topics Concern  . Not on file  Social History Narrative   Retired - Environmental consultant    Married 74 years       He enjoys traveling    Investment banker, operational of Radio broadcast assistant Strain: Not on [employer] Insecurity: Not on file  Transportation Needs: Not on file  Physical Activity: Not on file  Stress: Not on file  Social Connections: Not on file  Intimate Partner Violence: Not on file    Allergies   Aspirin  Family history:   Family History  Problem Relation Age of Onset  . Hypertension Mother   . Cancer Father        Mesothelioma   . Stomach cancer Brother   . Cancer Brother   . Cancer - Cervical Brother   . Cancer Brother   . Diabetes Brother   . Esophageal cancer Neg Hx   . Colon cancer Neg Hx   . Pancreatic cancer Neg Hx     Current Medications:   Prior to Admission medications   Medication Sig Start Date End Date Taking? Authorizing Provider  acetaminophen (TYLENOL) 325 MG tablet Take 650 mg by mouth every 6 (six) hours as needed for mild pain or moderate pain.    [provider]  amLODipine (NORVASC) 10 MG tablet TAKE 1 TABLET BY MOUTH EVERY DAY Patient taking differently: Take 10 mg by mouth daily. 06/29/20   Nafziger, Tommi Rumps, NP  atorvastatin (LIPITOR) 20 MG tablet TAKE 1 TABLET BY MOUTH EVERY DAY Patient taking differently: Take 20 mg by mouth daily. 06/29/20   Nafziger, Tommi Rumps, NP  Blood Glucose Monitoring Suppl (ACCU-CHEK AVIVA PLUS) w/Device KIT Used to check blood glucose 2 times a day or PRN Patient taking differently: 1 each by Other route See admin instructions. Used to check blood glucose 2 times a day or PRN 09/04/19   Nafziger, Tommi Rumps, NP  cholecalciferol (VITAMIN D3) 25 MCG (1000 UNIT) tablet Take 1,000 Units by mouth daily.    [provider]  cloNIDine (CATAPRES) 0.3 MG tablet Take 1 tablet (0.3 mg total) by mouth 3 (three) times daily. 09/20/20   Harold Hedge, MD  clopidogrel (PLAVIX) 75 MG tablet TAKE 1 TABLET BY MOUTH EVERY DAY Patient taking differently: Take 75 mg by mouth daily. 05/20/20   Nafziger, Tommi Rumps, NP  doxazosin (CARDURA) 2 MG tablet Take 2 mg by mouth at bedtime.  12/28/17   [provider]  esomeprazole (NEXIUM)  40 MG capsule TAKE 1 CAPSULE BY MOUTH EVERY DAY Patient taking differently: Take 40 mg by mouth daily. TAKE 1 CAPSULE BY MOUTH EVERY DAY 05/20/20   Nafziger, Tommi Rumps, NP  glipiZIDE (GLUCOTROL XL) 5 MG 24 hr tablet Take 1 tablet (5 mg total) by mouth daily. Need diabetic follow up for further refills 06/24/20   Nafziger, Tommi Rumps, NP  glucose blood (ACCU-CHEK AVIVA PLUS) test strip Used to check blood glucose BID or PRN Patient taking differently: 1 each by Other route See admin instructions. Used to check blood glucose BID or PRN 09/04/19   Dorothyann Peng, NP  hydrALAZINE (APRESOLINE) 100 MG tablet Take 1 tablet (100 mg total) by mouth 3 (three) times daily. 09/20/20 10/20/20  Harold Hedge, MD  hydrocortisone (  ANUSOL-HC) 2.5 % rectal cream PLACE 1 APPLICATION RECTALLY 2 TIMES A DAY. Patient taking differently: Place 1 application rectally 2 (two) times daily. 08/28/20   Nafziger, Tommi Rumps, NP  isosorbide dinitrate (ISORDIL) 20 MG tablet Take 1 tablet (20 mg total) by mouth 3 (three) times daily. 08/28/20   Nafziger, Tommi Rumps, NP  KLOR-CON M10 10 MEQ tablet Take 2 tablets (20 mEq total) by mouth 2 (two) times daily. 09/20/20 10/20/20  Harold Hedge, MD  lactulose ([place]) 10 GM/15ML solution TAKE 15 ML BY MOUTH 2 TIMES DAILY AS NEEDED FOR MILD CONSTIPATION. Patient taking differently: Take 10 g by mouth 2 (two) times daily as needed for mild constipation. 06/16/20   Nafziger, Tommi Rumps, NP  Lancets (ACCU-CHEK MULTICLIX) lancets Used to check blood glucose BID or PRN Patient taking differently: 1 each by Other route See admin instructions. Used to check blood glucose BID or PRN 09/04/19   Dorothyann Peng, NP  losartan (COZAAR) 25 MG tablet Take 12.5 mg by mouth daily. 05/20/20   [provider]  metolazone (ZAROXOLYN) 2.5 MG tablet Take 1 tablet (2.5 mg total) by mouth daily. 09/21/20 10/21/20  Harold Hedge, MD  metoprolol succinate (TOPROL-XL) 100 MG 24 hr tablet Take 1 tablet (100 mg total) by mouth daily. Take with or  immediately following a meal. 09/21/20 10/21/20  Harold Hedge, MD  POLY-IRON 150 150 MG capsule Take 150 mg by mouth daily. 01/27/20   [provider]  Propylene Glycol (SYSTANE BALANCE OP) Place 1 drop into both eyes daily as needed.    [provider]  torsemide (DEMADEX) 20 MG tablet Take 1 tablet (20 mg total) by mouth 2 (two) times daily. 09/20/20 10/20/20  Harold Hedge, MD  triamcinolone cream (KENALOG) 0.1 % Apply 1 application topically 2 (two) times daily. 07/05/19   [provider]    Physical Exam:   Vitals:   10/09/20 1023 10/09/20 1230 10/09/20 1300  BP: (!) 177/72 (!) 177/61 (!) 177/62  Pulse: 63 (!) 51 (!) 48  Resp: '14 15 12  ' Temp: 98.4 F (36.9 C)    TempSrc: Oral    SpO2: 98% 99% 99%     Physical Exam: Blood pressure (!) 177/62, pulse (!) 48, temperature 98.4 F (36.9 C), temperature source Oral, resp. rate 12, SpO2 99 %. Gen: Chronically ill-appearing gentleman looking stated age sitting up in stretcher no acute distress. Eyes: sclera anicteric, conjuctiva mildly injected bilaterally CVS: S1-S2, regulary, no gallops Respiratory:  decreased air entry likely secondary to decreased inspiratory effort GI: NABS, soft, NT  LE: No edema. No cyanosis Neuro: A/O x 3, Moving all extremities equally with normal strength, CN 3-12 intact, grossly nonfocal.  Psych: patient is logical and coherent, judgement and insight appear normal, mood and affect appropriate to situation. Skin: no rashes or lesions or ulcers,    Data Review:    Labs: Basic Metabolic Panel: Recent Labs  Lab 10/09/20 1035  NA 134*  K 2.9*  CL 93*  CO2 27  GLUCOSE 141*  BUN 137*  CREATININE 6.24*  CALCIUM 9.4   Liver Function Tests: Recent Labs  Lab 10/09/20 1035  AST 14*  ALT 11  ALKPHOS 74  BILITOT 0.8  PROT 7.3  ALBUMIN 3.8   No results for input(s): LIPASE, AMYLASE in the last 168 hours. No results for input(s): AMMONIA in the last 168 hours. CBC: Recent  Labs  Lab 10/09/20 1035  WBC 8.4  NEUTROABS 6.6  HGB 10.8*  HCT 33.4*  MCV 82.1  PLT 325   Cardiac Enzymes: No results for input(s): CKTOTAL, CKMB, CKMBINDEX, TROPONINI in the last 168 hours.  BNP (last 3 results) No results for input(s): PROBNP in the last 8760 hours. CBG: No results for input(s): GLUCAP in the last 168 hours.  Urinalysis    Component Value Date/Time   COLORURINE YELLOW 09/17/2020 1240   APPEARANCEUR CLEAR 09/17/2020 1240   LABSPEC 1.012 09/17/2020 1240   PHURINE 6.0 09/17/2020 1240   GLUCOSEU 50 (A) 09/17/2020 1240   HGBUR SMALL (A) 09/17/2020 1240   HGBUR negative 09/19/2007 0000   BILIRUBINUR NEGATIVE 09/17/2020 1240   BILIRUBINUR n 11/18/2014 1003   KETONESUR NEGATIVE 09/17/2020 1240   PROTEINUR >=300 (A) 09/17/2020 1240   UROBILINOGEN 0.2 11/18/2014 1003   UROBILINOGEN 1.0 01/26/2013 2059   NITRITE NEGATIVE 09/17/2020 1240   LEUKOCYTESUR NEGATIVE 09/17/2020 1240      Radiographic Studies: No results found.  EKG: Independently reviewed.  Sinus rhythm at 50.  LVH by voltage.  LAD at -20.  T wave inversions V5 and V6 possible repol abnormality   Assessment/Plan:   Principal Problem:   Acute on chronic renal failure (HCC) Active Problems:   Diabetes mellitus with renal complications (HCC)   Essential hypertension   Gastrointestinal hemorrhage with melena   Elevated serum immunoglobulin free light chains   CKD (chronic kidney disease), stage IV (HCC)   Mass of right lung   86 year old gentleman discharged 2 weeks ago after treatment for decompensated HFpEF is admitted with acute on chronic kidney failure and persistent hypokalemia.  Acute on chronic kidney injury Microscopy is ordered and pending Most likely secondary to prerenal causes given increased diuresis Will hold patient's torsemide and Zaroxolyn for now We will not start IV hydration at present given high risk for decompensation of heart failure Nephrology has been  consulted and will be following  Hypokalemia Potassium 2.9, creatinine 6 Received KCl 10 mill equivalents IV x1 and 40 mill equivalents of K. Dur p.o. Repeat potassium tonight and replete as warranted  HTN Patient has uncontrolled hypertension, was admitted with hypertensive urgency at last admission Patient is presently asymptomatic from this viewpoint, no chest pain, no dizziness Will restart patient's amlodipine, clonidine, doxazosin, Toprol and hydralazine Hold losartan Renal failure was also contributory, further management per renal  DM2 Hold glipizide SSI AC at bedtime, sensitive dose  HFpEF At present patient is euvolemic As noted above will not start IV hydration despite renal failure We will continue metoprolol Hold losartan and diuretics  Right upper lobe lung mass Found at last hospitalization approximately 2 cm with irregular margins Patient is supposed to follow-up with pulmonary as an outpatient  History of TIA Continue Plavix  GERD Continue as omeprazole   Other information:   DVT prophylaxis: Subcu heparin ordered. Code Status: Full Family Communication: Patient stated no need to call family Disposition Plan: Home Consults called: Nephrology Admission status: Inpatient  [facility]  If 7PM-7AM, please contact night-coverage [email or web address] Password [email or web address] 10/09/2020, 1:28 PM

## 2020-10-09 NOTE — ED Notes (Signed)
Dinner Order Placed 

## 2020-10-09 NOTE — ED Notes (Signed)
Pt added to transport teletracking list.

## 2020-10-10 ENCOUNTER — Encounter (HOSPITAL_COMMUNITY): Payer: Self-pay | Admitting: Internal Medicine

## 2020-10-10 DIAGNOSIS — N184 Chronic kidney disease, stage 4 (severe): Secondary | ICD-10-CM

## 2020-10-10 DIAGNOSIS — E876 Hypokalemia: Secondary | ICD-10-CM

## 2020-10-10 DIAGNOSIS — E1122 Type 2 diabetes mellitus with diabetic chronic kidney disease: Secondary | ICD-10-CM

## 2020-10-10 LAB — CBC
HCT: 29.1 % — ABNORMAL LOW (ref 39.0–52.0)
Hemoglobin: 10.1 g/dL — ABNORMAL LOW (ref 13.0–17.0)
MCH: 27.7 pg (ref 26.0–34.0)
MCHC: 34.7 g/dL (ref 30.0–36.0)
MCV: 79.9 fL — ABNORMAL LOW (ref 80.0–100.0)
Platelets: 285 10*3/uL (ref 150–400)
RBC: 3.64 MIL/uL — ABNORMAL LOW (ref 4.22–5.81)
RDW: 13.1 % (ref 11.5–15.5)
WBC: 6.8 10*3/uL (ref 4.0–10.5)
nRBC: 0 % (ref 0.0–0.2)

## 2020-10-10 LAB — BASIC METABOLIC PANEL
Anion gap: 14 (ref 5–15)
BUN: 126 mg/dL — ABNORMAL HIGH (ref 8–23)
CO2: 25 mmol/L (ref 22–32)
Calcium: 9.1 mg/dL (ref 8.9–10.3)
Chloride: 99 mmol/L (ref 98–111)
Creatinine, Ser: 5.01 mg/dL — ABNORMAL HIGH (ref 0.61–1.24)
GFR, Estimated: 11 mL/min — ABNORMAL LOW (ref >60)
Glucose, Bld: 130 mg/dL — ABNORMAL HIGH (ref 70–99)
Potassium: 2.8 mmol/L — ABNORMAL LOW (ref 3.5–5.1)
Sodium: 138 mmol/L (ref 135–145)

## 2020-10-10 LAB — GLUCOSE, CAPILLARY
Glucose-Capillary: 129 mg/dL — ABNORMAL HIGH (ref 70–99)
Glucose-Capillary: 157 mg/dL — ABNORMAL HIGH (ref 70–99)
Glucose-Capillary: 172 mg/dL — ABNORMAL HIGH (ref 70–99)

## 2020-10-10 MED ORDER — POTASSIUM CHLORIDE CRYS ER 10 MEQ PO TBCR
40.0000 meq | EXTENDED_RELEASE_TABLET | Freq: Two times a day (BID) | ORAL | Status: DC
  Administered 2020-10-10 – 2020-10-12 (×5): 40 meq via ORAL
  Filled 2020-10-10 (×3): qty 4
  Filled 2020-10-10: qty 2

## 2020-10-10 MED ORDER — POTASSIUM CHLORIDE CRYS ER 20 MEQ PO TBCR: 40.0000 meq | EXTENDED_RELEASE_TABLET | Freq: Two times a day (BID) | ORAL | Status: DC

## 2020-10-10 MED ORDER — POTASSIUM CHLORIDE CRYS ER 20 MEQ PO TBCR
40.0000 meq | EXTENDED_RELEASE_TABLET | Freq: Two times a day (BID) | ORAL | Status: DC
  Filled 2020-10-10: qty 2

## 2020-10-10 MED ORDER — LACTATED RINGERS IV SOLN: INTRAVENOUS | Status: AC

## 2020-10-10 NOTE — Consult Note (Signed)
Nephrology Consult   Requesting provider: Eleonore Chiquito Service requesting consult: Hospitalist Reason for consult: AKI on CKD4   Assessment/Recommendations: Austin [name] is a/an 86 y.o. male with a past medical history HTN, CHF, DM 2, CKD 4 who present w/ AKI on CKD  Non-Oliguric AKI on CKD 4: Likely secondary to dehydration from overdiuresis.  Creatinine already improving with holding diuretics -Continue to hold diuretics -Infusion of LR over 10 hours.  Would stop for respiratory distress -Continue to monitor daily Cr, Dose meds for GFR -Monitor Daily I/Os, Daily weight  -Maintain MAP>65 for optimal renal perfusion.  -Avoid nephrotoxic medications including NSAIDs and Vanc/Zosyn combo -Currently no indication for HD -Plan is for PD as an outpatient  Volume Status: Appears slightly volume depleted on exam.  Fluids as above  Hypertension: Slightly elevated.  Difficult to control in the past.  Continue current medications  DM2: Management per primary team  Hypokalemia: Potassium 2.8 likely related to diuretics.  Continue with repletion as needed  CHF: Appears slightly volume depleted.  Hold diuretics for now   Recommendations conveyed to primary service.    [facility] 10/10/2020 11:56 AM   _____________________________________________________________________________________ CC: AKI on CKD  History of Present Illness: Austin [name] is a/an 86 y.o. male with a past medical history of HTN, CHF, DM 2, CKD 4 who presents with acute for renal failure and uremia.  Patient was recently hospitalized for decompensated heart failure requiring diuretics.  He received aggressive oral diuresis and was discharged on such.  His creatinine worsened during that time from 3-4.2.  On outpatient follow-up after discharge the patient's creatinine rose to 6.2 and BUN was markedly elevated.  It was recommended that he return to the hospital.  The patient states that he has  felt tired but overall no significant changes.  Denies any shortness of breath.  Feels his lower extremity edema is improved.  Notes that he has been urinating normally without significant issues.  He has been taking his diuretics without missing doses.  Denies nausea or confusion.  Decreased appetite is present   Creatinine was 6.2 on arrival.  Improved to 5 today.  Potassium has remained low at 2.8.  Medications:  Current Facility-Administered Medications  Medication Dose Route Frequency Provider Last Rate Last Admin  . 0.9 %  sodium chloride infusion  250 mL Intravenous PRN Bonnell Public Tublu, MD      . acetaminophen (TYLENOL) tablet 650 mg  650 mg Oral Q6H PRN Vashti Hey, MD       Or  . acetaminophen (TYLENOL) suppository 650 mg  650 mg Rectal Q6H PRN Bonnell Public Tublu, MD      . acetaminophen (TYLENOL) tablet 325 mg  325 mg Oral QID Bonnell Public Tublu, MD   325 mg at 10/10/20 1043  . amLODipine (NORVASC) tablet 10 mg  10 mg Oral Daily Bonnell Public Tublu, MD   10 mg at 10/10/20 1043  . ammonium lactate (LAC-HYDRIN) 12 % lotion   Topical PRN Bonnell Public Tublu, MD      . atorvastatin (LIPITOR) tablet 20 mg  20 mg Oral Daily Bonnell Public Tublu, MD   20 mg at 10/10/20 1043  . bisacodyl (DULCOLAX) suppository 10 mg  10 mg Rectal Daily PRN Vashti Hey, MD   10 mg at 10/09/20 1658  . cloNIDine (CATAPRES) tablet 0.3 mg  0.3 mg Oral TID Bonnell Public Tublu, MD   0.3 mg at 10/10/20 1043  . clopidogrel (PLAVIX) tablet  75 mg  75 mg Oral Q supper Bonnell Public Tublu, MD   75 mg at 10/09/20 1658  . doxazosin (CARDURA) tablet 2 mg  2 mg Oral QHS Bonnell Public Tublu, MD   2 mg at 10/09/20 2159  . heparin injection 5,000 Units  5,000 Units Subcutaneous Q8H Vashti Hey, MD   5,000 Units at 10/10/20 [phone]  . insulin aspart (novoLOG) injection 0-6 Units  0-6 Units Subcutaneous TID WC Chatterjee, Dewaine Oats  Tublu, MD      . isosorbide-hydrALAZINE (BIDIL) 20-37.5 MG per tablet 1 tablet  1 tablet Oral TID Vashti Hey, MD   1 tablet at 10/10/20 1043  . lactated ringers infusion   Intravenous Continuous Reesa Chew, MD      . Derrill Memo ON 10/11/2020] lactulose (CHRONULAC) 10 GM/15ML solution 10 g  10 g Oral QODAY Bonnell Public Tublu, MD      . lactulose ([place]) 10 GM/15ML solution 10 g  10 g Oral Kateri Mc Tublu, MD   10 g at 10/09/20 1732  . metoprolol succinate (TOPROL-XL) 24 hr tablet 100 mg  100 mg Oral Daily Bonnell Public Tublu, MD   100 mg at 10/10/20 1043  . pantoprazole (PROTONIX) EC tablet 80 mg  80 mg Oral Q1200 Bonnell Public Tublu, MD      . polyethylene glycol (MIRALAX / GLYCOLAX) packet 17 g  17 g Oral Daily PRN Bonnell Public Tublu, MD      . polyvinyl alcohol (LIQUIFILM TEARS) 1.4 % ophthalmic solution 1 drop  1 drop Both Eyes PRN Bonnell Public Tublu, MD      . potassium chloride SA (KLOR-CON) CR tablet 40 mEq  40 mEq Oral BID Donnamae Jude, RPH   40 mEq at 10/10/20 1044  . sodium chloride flush (NS) 0.9 % injection 3 mL  3 mL Intravenous Q12H Bonnell Public Tublu, MD   3 mL at 10/10/20 1044  . sodium chloride flush (NS) 0.9 % injection 3 mL  3 mL Intravenous PRN Jamse Arn Kyra Searles, MD         ALLERGIES Aspirin  MEDICAL HISTORY Past Medical History:  Diagnosis Date  . 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 CAROTID ARTERY STENOSIS 05/10/2010  . CHF (congestive heart failure) ([place])   . DIABETES MELLITUS, TYPE II 09/19/2007  . DISEASE, CEREBROVASCULAR NEC 03/05/2007  . GERD 03/13/2007  . HYPERLIPIDEMIA 03/05/2007  . HYPERTENSION 03/05/2007  . HYPOKALEMIA 11/09/2009  . KNEE PAIN, RIGHT 11/09/2009  . PROSTATE CANCER, HX OF 03/05/2007  . RENAL DISEASE, CHRONIC 02/03/2009     SOCIAL HISTORY Social History   Socioeconomic History  . Marital status: Married    Spouse name: Not on file  . Number of  children: Not on file  . Years of education: Not on file  . Highest education level: Not on file  Occupational History  . Not on file  Tobacco Use  . Smoking status: Former Smoker    Types: Cigarettes    Quit date: 08/22/1974    Years since quitting: 46.1  . Smokeless tobacco: Never Used  Vaping Use  . Vaping Use: Never used  Substance and Sexual Activity  . Alcohol use: No    Alcohol/week: 0.0 standard drinks  . Drug use: No  . Sexual activity: Not on file  Other Topics Concern  . Not on file  Social History Narrative   Retired Editor, commissioning    Married 53 years  He enjoys traveling    Social Determinants of Radio broadcast assistant Strain: Not on file  Food Insecurity: Not on file  Transportation Needs: Not on file  Physical Activity: Not on file  Stress: Not on file  Social Connections: Not on file  Intimate Partner Violence: Not on file     FAMILY HISTORY Family History  Problem Relation Age of Onset  . Hypertension Mother   . Cancer Father        Mesothelioma   . Stomach cancer Brother   . Cancer Brother   . Cancer - Cervical Brother   . Cancer Brother   . Diabetes Brother   . Esophageal cancer Neg Hx   . Colon cancer Neg Hx   . Pancreatic cancer Neg Hx       Review of Systems: 12 systems reviewed Otherwise as per HPI, all other systems reviewed and negative  Physical Exam: Vitals:   10/10/20 0519 10/10/20 1100  BP: (!) 161/60 (!) 155/63  Pulse: (!) 54 63  Resp: 20   Temp: 98 F (36.7 C) 98.2 F (36.8 C)  SpO2: 97% 98%   Total I/O In: 240 [P.O.:240] Out: 625 [Urine:625]  Intake/Output Summary (Last 24 hours) at 10/10/2020 1156 Last data filed at 10/10/2020 1000 Gross per 24 hour  Intake 640 ml  Output 1425 ml  Net -785 ml   General: well-appearing, no acute distress HEENT: anicteric sclera, oropharynx clear without lesions CV: Normal rate, no murmurs, no peripheral edema Lungs: clear to auscultation bilaterally, normal  work of breathing Abd: soft, non-tender, non-distended Skin: no visible lesions or rashes Psych: alert, engaged, appropriate mood and affect Musculoskeletal: no obvious deformities Neuro: normal speech, no gross focal deficits   Test Results Reviewed Lab Results  Component Value Date   NA 138 10/10/2020   K 2.8 (L) 10/10/2020   CL 99 10/10/2020   CO2 25 10/10/2020   BUN 126 (H) 10/10/2020   CREATININE 5.01 (H) 10/10/2020   GFR 8.72 (LL) 09/23/2020   GLU 119 12/02/2019   CALCIUM 9.1 10/10/2020   ALBUMIN 3.8 10/09/2020   PHOS 3.2 02/03/2009     I have reviewed all relevant outside healthcare records related to the patient's current hospitalization

## 2020-10-10 NOTE — Progress Notes (Addendum)
[facility]  PROGRESS NOTE  Austin [name] QRF:[id number] DOB: 07-07-1935 DOA: 10/09/2020 PCP: Dorothyann Peng, NP   Brief HPI:   86 year old male with history of hypertension, HFpEF, diabetes mellitus type 2, CKD stage IV, hyperlipidemia was discharged 2 weeks ago after treatment for acute exacerbation of HFpEF.  Lasix at that time was changed to torsemide and Zaroxolyn was added.  At that time creatinine worsened from 3.02-4.2.  He was deemed okay for discharge by nephrology and follow-up as outpatient.  Patient was seen by nephrology yesterday and noted to have creatinine of 6.2 with elevated BUN.  He was sent to ED for further evaluation.  Patient says that he feels sluggish but no different than he fell 2 weeks ago.  Denies shortness of breath or dyspnea on exertion.    Subjective   Patient seen and examined, denies any complaints.   Assessment/Plan:     1. AKI on CKD stage IV-secondary to overdiuresis.  Diuretics currently on hold.  Patient started on LR at 100 ml/hr for 10 hours by nephrology.  Avoid nephrotoxic medications.  Follow renal function in a.m. 2. Hypokalemia-potassium is 2.8, started on K. Dur 40 mg p.o. twice daily. Magnesium is 2.5.  Follow BMP in am. 3. Diabetes mellitus type 2-glipizide on hold.  Continue sliding scale insulin with NovoLog.  CBG well controlled. 4. HFpEF-currently euvolemic, diuretics and losartan on hold.  Continue metoprolol.  Started on IV LR at 100 mL/h.  We will keep a close watch on patient's respiratory status. 5. Right upper lobe lung mass-found at last hospitalization, 2 cm with irregular margins.  Patient was supposed to follow-up with pulmonology as outpatient. 6. Hypertension-blood pressure is elevated, continue home medication including amlodipine, Catapres, metoprolol. 7. History of TIA-continue Plavix 8. GERD-continue pantoprazole     COVID-19 Labs  No results for input(s): DDIMER, FERRITIN, LDH, CRP in the last 72 hours.  Lab  Results  Component Value Date   SARSCOV2NAA NEGATIVE 10/09/2020   SARSCOV2NAA NEGATIVE 09/16/2020   [place] NEGATIVE 02/23/2020   [place] NEGATIVE 08/04/2019     Scheduled medications:   . acetaminophen  325 mg Oral QID  . amLODipine  10 mg Oral Daily  . atorvastatin  20 mg Oral Daily  . cloNIDine  0.3 mg Oral TID  . clopidogrel  75 mg Oral Q supper  . doxazosin  2 mg Oral QHS  . heparin  5,000 Units Subcutaneous Q8H  . insulin aspart  0-6 Units Subcutaneous TID WC  . isosorbide-hydrALAZINE  1 tablet Oral TID  . [START ON 10/11/2020] lactulose  10 g Oral QODAY  . lactulose  10 g Oral QODAY  . metoprolol succinate  100 mg Oral Daily  . pantoprazole  80 mg Oral Q1200  . potassium chloride  40 mEq Oral BID  . sodium chloride flush  3 mL Intravenous Q12H         CBG: Recent Labs  Lab 10/09/20 1902 10/10/20 0651 10/10/20 1136  GLUCAP 138* 129* 157*    SpO2: 98 %    CBC: Recent Labs  Lab 10/09/20 1035 10/09/20 1533 10/10/20 0653  WBC 8.4 7.6 6.8  NEUTROABS 6.6  --   --   HGB 10.8* 10.3* 10.1*  HCT 33.4* 30.9* 29.1*  MCV 82.1 82.2 79.9*  PLT 325 287 826    Basic Metabolic Panel: Recent Labs  Lab 10/09/20 1035 10/09/20 1533 10/09/20 2000 10/10/20 0653  NA 134*  --  137 138  K 2.9*  --  2.9*  2.8*  CL 93*  --  97* 99  CO2 27  --  26 25  GLUCOSE 141*  --  175* 130*  BUN 137*  --  130* 126*  CREATININE 6.24* 5.86* 5.38* 5.01*  CALCIUM 9.4  --  9.0 9.1  MG 2.5*  --   --   --      Liver Function Tests: Recent Labs  Lab 10/09/20 1035  AST 14*  ALT 11  ALKPHOS 74  BILITOT 0.8  PROT 7.3  ALBUMIN 3.8     Antibiotics: Anti-infectives (From admission, onward)   None       DVT prophylaxis: Heparin  Code Status: Full code  Family Communication: No family at bedside   Consultants:  Nephrology  Procedures:      Objective   Vitals:   10/09/20 1902 10/09/20 2023 10/10/20 0519 10/10/20 1100  BP:  (!) 180/61 (!) 161/60  (!) 155/63  Pulse:  60 (!) 54 63  Resp:   20   Temp:  98.1 F (36.7 C) 98 F (36.7 C) 98.2 F (36.8 C)  TempSrc:  Oral Oral Oral  SpO2:  99% 97% 98%  Weight:      Height: 5\' 7"  (1.702 m)       Intake/Output Summary (Last 24 hours) at 10/10/2020 1359 Last data filed at 10/10/2020 1000 Gross per 24 hour  Intake 540 ml  Output 1425 ml  Net -885 ml    02/17 1901 - 02/19 0700 In: 400 [P.O.:300] Out: 800 [Urine:400]  Filed Weights   10/09/20 1847  Weight: 75.7 kg    Physical Examination:    General-appears in no acute distress  Heart-S1-S2, regular, no murmur auscultated  Lungs-clear to auscultation bilaterally, no wheezing or crackles auscultated  Abdomen-soft, nontender, no organomegaly  Extremities-no edema in the lower extremities  Neuro-alert, oriented x3, no focal deficit noted   Status is: Inpatient  Dispo: The patient is from: Home              Anticipated d/c is to: Home              Anticipated d/c date is: 2/ 21/22              Patient currently not stable for discharge  Barrier to discharge-ongoing treatment for acute kidney injury on CKD stage IV       Data Reviewed:   Recent Results (from the past 240 hour(s))  SARS CORONAVIRUS 2 (TAT 6-24 HRS) Nasopharyngeal Nasopharyngeal Swab     Status: None   Collection Time: 10/09/20 12:22 PM   Specimen: Nasopharyngeal Swab  Result Value Ref Range Status   SARS Coronavirus 2 NEGATIVE NEGATIVE Final    Comment: (NOTE) SARS-CoV-2 target nucleic acids are NOT DETECTED.  The SARS-CoV-2 RNA is generally detectable in upper and lower respiratory specimens during the acute phase of infection. Negative results do not preclude SARS-CoV-2 infection, do not rule out co-infections with other pathogens, and should not be used as the sole basis for treatment or other patient management decisions. Negative results must be combined with clinical observations, patient history, and epidemiological information.  The expected result is Negative.  Fact Sheet for Patients: SugarRoll.be  Fact Sheet for Healthcare Providers: https://www.woods-mathews.com/  This test is not yet approved or cleared by the [place] FDA and  has been authorized for detection and/or diagnosis of SARS-CoV-2 by FDA under an Emergency Use Authorization (EUA). This EUA will remain  in effect (meaning this test can be  used) for the duration of the COVID-19 declaration under Se ction 564(b)(1) of the Act, 21 U.S.C. section 360bbb-3(b)(1), unless the authorization is terminated or revoked sooner.  Performed at [facility], [place] [address] [place], [place] [address]     No results for input(s): LIPASE, AMYLASE in the last 168 hours. No results for input(s): AMMONIA in the last 168 hours.  Cardiac Enzymes: No results for input(s): CKTOTAL, CKMB, CKMBINDEX, TROPONINI in the last 168 hours. BNP (last 3 results) Recent Labs    11/28/19 0529 02/23/20 1559 09/16/20 0938  BNP 1,296.2* 1,713.3* 1,223.4*        Oswald Hillock   [facility] If 7PM-7AM, please contact night-coverage at [email or web address], Office  [phone]   10/10/2020, 1:59 PM  LOS: 1 day

## 2020-10-11 LAB — GLUCOSE, CAPILLARY
Glucose-Capillary: 153 mg/dL — ABNORMAL HIGH (ref 70–99)
Glucose-Capillary: 149 mg/dL — ABNORMAL HIGH (ref 70–99)
Glucose-Capillary: 140 mg/dL — ABNORMAL HIGH (ref 70–99)

## 2020-10-11 LAB — COMPREHENSIVE METABOLIC PANEL
ALT: 10 U/L (ref 0–44)
AST: 10 U/L — ABNORMAL LOW (ref 15–41)
Albumin: 3 g/dL — ABNORMAL LOW (ref 3.5–5.0)
Alkaline Phosphatase: 51 U/L (ref 38–126)
Anion gap: 10 (ref 5–15)
BUN: 112 mg/dL — ABNORMAL HIGH (ref 8–23)
CO2: 25 mmol/L (ref 22–32)
Calcium: 9 mg/dL (ref 8.9–10.3)
Chloride: 103 mmol/L (ref 98–111)
Creatinine, Ser: 4.64 mg/dL — ABNORMAL HIGH (ref 0.61–1.24)
GFR, Estimated: 12 mL/min — ABNORMAL LOW (ref >60)
Glucose, Bld: 147 mg/dL — ABNORMAL HIGH (ref 70–99)
Potassium: 3.3 mmol/L — ABNORMAL LOW (ref 3.5–5.1)
Sodium: 138 mmol/L (ref 135–145)
Total Bilirubin: 1.2 mg/dL (ref 0.3–1.2)
Total Protein: 6 g/dL — ABNORMAL LOW (ref 6.5–8.1)

## 2020-10-11 MED ORDER — SACCHAROMYCES BOULARDII 250 MG PO CAPS
250.0000 mg | ORAL_CAPSULE | Freq: Two times a day (BID) | ORAL | Status: DC
  Administered 2020-10-11 – 2020-10-12 (×3): 250 mg via ORAL
  Filled 2020-10-11 (×3): qty 1

## 2020-10-11 NOTE — Progress Notes (Signed)
Nephrology Follow-Up Consult note   Assessment/Recommendations: Austin [name] is a/an 86 y.o. male with a past medical history significant for HTN, CHF, DM 2, CKD 4 who present w/ AKI on CKD  Non-Oliguric AKI on CKD 4: Likely secondary to dehydration from overdiuresis.  Creatinine improving with conservative management -Continue to hold diuretics -Continue to monitor daily Cr, Dose meds for GFR -Monitor Daily I/Os, Daily weight  -Maintain MAP>65 for optimal renal perfusion.  -Avoid nephrotoxic medications including NSAIDs and Vanc/Zosyn combo -Currently no indication for HD -Plan is for PD as an outpatient -Likely can DC tomorrow after nephrology evaluation  Volume Status: Appears euvolemic at this time  Hypertension: Slightly elevated.  Difficult to control in the past.  Continue current medications  DM2: Management per primary team  Hypokalemia: Slowly improving with repletion  CHF: Appears slightly volume depleted.  Hold diuretics for now   Recommendations conveyed to primary service.    [facility] 10/11/2020 11:26 AM  ___________________________________________________________  CC: AKI on CKD  Interval History/Subjective: Patient states he feels well today.  No shortness of breath.  Creatinine improving.  Urine output good   Medications:  Current Facility-Administered Medications  Medication Dose Route Frequency Provider Last Rate Last Admin  . 0.9 %  sodium chloride infusion  250 mL Intravenous PRN Vashti Hey, MD   Stopped at 10/10/20 2154  . acetaminophen (TYLENOL) tablet 650 mg  650 mg Oral Q6H PRN Vashti Hey, MD       Or  . acetaminophen (TYLENOL) suppository 650 mg  650 mg Rectal Q6H PRN Bonnell Public Tublu, MD      . acetaminophen (TYLENOL) tablet 325 mg  325 mg Oral QID Vashti Hey, MD   325 mg at 10/11/20 0914  . amLODipine (NORVASC) tablet 10 mg  10 mg Oral Daily  Bonnell Public Tublu, MD   10 mg at 10/11/20 0914  . ammonium lactate (LAC-HYDRIN) 12 % lotion   Topical PRN Bonnell Public Tublu, MD      . atorvastatin (LIPITOR) tablet 20 mg  20 mg Oral Daily Bonnell Public Tublu, MD   20 mg at 10/11/20 0914  . bisacodyl (DULCOLAX) suppository 10 mg  10 mg Rectal Daily PRN Vashti Hey, MD   10 mg at 10/09/20 1658  . cloNIDine (CATAPRES) tablet 0.3 mg  0.3 mg Oral TID Bonnell Public Tublu, MD   0.3 mg at 10/11/20 0914  . clopidogrel (PLAVIX) tablet 75 mg  75 mg Oral Q supper Bonnell Public Tublu, MD   75 mg at 10/10/20 1738  . doxazosin (CARDURA) tablet 2 mg  2 mg Oral QHS Bonnell Public Tublu, MD   2 mg at 10/10/20 2121  . heparin injection 5,000 Units  5,000 Units Subcutaneous Q8H Vashti Hey, MD   5,000 Units at 10/11/20 0533  . insulin aspart (novoLOG) injection 0-6 Units  0-6 Units Subcutaneous TID WC Vashti Hey, MD   1 Units at 10/11/20 0914  . isosorbide-hydrALAZINE (BIDIL) 20-37.5 MG per tablet 1 tablet  1 tablet Oral TID Vashti Hey, MD   1 tablet at 10/11/20 0913  . lactulose (CHRONULAC) 10 GM/15ML solution 10 g  10 g Oral QODAY Chatterjee, Srobona Tublu, MD      . lactulose ([place]) 10 GM/15ML solution 10 g  10 g Oral Kateri Mc Tublu, MD   10 g at 10/09/20 1732  . metoprolol succinate (TOPROL-XL) 24 hr tablet 100 mg  100 mg Oral  Daily Bonnell Public Tublu, MD   100 mg at 10/10/20 1043  . pantoprazole (PROTONIX) EC tablet 80 mg  80 mg Oral Q1200 Vashti Hey, MD   80 mg at 10/11/20 0913  . polyethylene glycol (MIRALAX / GLYCOLAX) packet 17 g  17 g Oral Daily PRN Bonnell Public Tublu, MD      . polyvinyl alcohol (LIQUIFILM TEARS) 1.4 % ophthalmic solution 1 drop  1 drop Both Eyes PRN Bonnell Public Tublu, MD      . potassium chloride (KLOR-CON) CR tablet 40 mEq  40 mEq Oral BID Oswald Hillock, MD   40 mEq at 10/11/20 0914  .  saccharomyces boulardii (FLORASTOR) capsule 250 mg  250 mg Oral BID [place], Gagan S, MD      . sodium chloride flush (NS) 0.9 % injection 3 mL  3 mL Intravenous Q12H Bonnell Public Tublu, MD   3 mL at 10/11/20 0916  . sodium chloride flush (NS) 0.9 % injection 3 mL  3 mL Intravenous PRN Vashti Hey, MD          Review of Systems: 10 systems reviewed and negative except per interval history/subjective  Physical Exam: Vitals:   10/11/20 0527 10/11/20 0900  BP: (!) 164/58 (!) 155/45  Pulse: (!) 48 (!) 47  Resp: 16 16  Temp: 97.7 F (36.5 C) 97.9 F (36.6 C)  SpO2: 96% 97%   Total I/O In: -  Out: 250 [Urine:250]  Intake/Output Summary (Last 24 hours) at 10/11/2020 1126 Last data filed at 10/11/2020 0900 Gross per 24 hour  Intake 720 ml  Output 1675 ml  Net -955 ml   Constitutional: well-appearing, no acute distress ENMT: ears and nose without scars or lesions, MMM CV: normal rate, no edema Respiratory: Bilateral chest rise, normal work of breathing Gastrointestinal: soft, non-tender, no palpable masses or hernias Skin: no visible lesions or rashes Psych: alert, judgement/insight appropriate, appropriate mood and affect   Test Results I personally reviewed new and old clinical labs and radiology tests Lab Results  Component Value Date   NA 138 10/11/2020   K 3.3 (L) 10/11/2020   CL 103 10/11/2020   CO2 25 10/11/2020   BUN 112 (H) 10/11/2020   CREATININE 4.64 (H) 10/11/2020   GFR 8.72 (LL) 09/23/2020   GLU 119 12/02/2019   CALCIUM 9.0 10/11/2020   ALBUMIN 3.0 (L) 10/11/2020   PHOS 3.2 02/03/2009

## 2020-10-11 NOTE — Progress Notes (Signed)
[facility]  PROGRESS NOTE  Austin [name] JEH:[id number] DOB: [birth year] DOA: 10/09/2020 PCP: Dorothyann Peng, NP   Brief HPI:   86 year old male with history of hypertension, HFpEF, diabetes mellitus type 2, CKD stage IV, hyperlipidemia was discharged 2 weeks ago after treatment for acute exacerbation of HFpEF.  Lasix at that time was changed to torsemide and Zaroxolyn was added.  At that time creatinine worsened from 3.02-4.2.  He was deemed okay for discharge by nephrology and follow-up as outpatient.  Patient was seen by nephrology yesterday and noted to have creatinine of 6.2 with elevated BUN.  He was sent to ED for further evaluation.  Patient says that he feels sluggish but no different than he fell 2 weeks ago.  Denies shortness of breath or dyspnea on exertion.    Subjective   Patient seen and examined, complains of gastric discomfort which lasted for few seconds this morning.  He does have history of GERD and is on PPI.  He denies chest pain or shortness of breath.   Assessment/Plan:     1. AKI on CKD stage IV-secondary to overdiuresis.  Diuretics currently on hold.  Patient started on LR at 100 ml/hr for 10 hours by nephrology.  Avoid nephrotoxic medications.  BUN/creatinine has improved to 112/4.64. 2. Hypokalemia-potassium is 3.3 today, improved from 2.8 yesterday.  Continue K. Dur 40 mg p.o. twice daily. Magnesium is 2.5.  Follow BMP in am. 3. Diabetes mellitus type 2-glipizide on hold.  Continue sliding scale insulin with NovoLog.  CBG well controlled. 4. HFpEF-currently euvolemic, diuretics and losartan on hold.  Continue metoprolol.  Does not appear to be volume overload.  We will keep a close watch on patient's respiratory status. 5. Right upper lobe lung mass-found at last hospitalization, 2 cm with irregular margins.  Patient was supposed to follow-up with pulmonology as outpatient. 6. Hypertension-blood pressure is elevated, continue home medication including  amlodipine, Catapres, metoprolol. 7. History of TIA-continue Plavix 8. GERD-continue pantoprazole 9. Constipation-continue MiraLAX 17 g daily as needed.  Patient said that he has felt bloated and developed constipation since he got antibiotics for infection in the past.  We will start Florastor to 50 mg p.o. twice daily     COVID-19 Labs  No results for input(s): DDIMER, FERRITIN, LDH, CRP in the last 72 hours.  Lab Results  Component Value Date   SARSCOV2NAA NEGATIVE 10/09/2020   SARSCOV2NAA NEGATIVE 09/16/2020   [place] NEGATIVE 02/23/2020   [place] NEGATIVE 08/04/2019     Scheduled medications:   . acetaminophen  325 mg Oral QID  . amLODipine  10 mg Oral Daily  . atorvastatin  20 mg Oral Daily  . cloNIDine  0.3 mg Oral TID  . clopidogrel  75 mg Oral Q supper  . doxazosin  2 mg Oral QHS  . heparin  5,000 Units Subcutaneous Q8H  . insulin aspart  0-6 Units Subcutaneous TID WC  . isosorbide-hydrALAZINE  1 tablet Oral TID  . lactulose  10 g Oral QODAY  . lactulose  10 g Oral QODAY  . metoprolol succinate  100 mg Oral Daily  . pantoprazole  80 mg Oral Q1200  . potassium chloride  40 mEq Oral BID  . saccharomyces boulardii  250 mg Oral BID  . sodium chloride flush  3 mL Intravenous Q12H         CBG: Recent Labs  Lab 10/10/20 0651 10/10/20 1136 10/10/20 1621 10/11/20 0639 10/11/20 1118  GLUCAP 129* 157* 172* 153* 149*  SpO2: 97 % O2 Flow Rate (L/min): 0 L/min    CBC: Recent Labs  Lab 10/09/20 1035 10/09/20 1533 10/10/20 0653  WBC 8.4 7.6 6.8  NEUTROABS 6.6  --   --   HGB 10.8* 10.3* 10.1*  HCT 33.4* 30.9* 29.1*  MCV 82.1 82.2 79.9*  PLT 325 287 892    Basic Metabolic Panel: Recent Labs  Lab 10/09/20 1035 10/09/20 1533 10/09/20 2000 10/10/20 0653 10/11/20 0504  NA 134*  --  137 138 138  K 2.9*  --  2.9* 2.8* 3.3*  CL 93*  --  97* 99 103  CO2 27  --  26 25 25   GLUCOSE 141*  --  175* 130* 147*  BUN 137*  --  130* 126* 112*   CREATININE 6.24* 5.86* 5.38* 5.01* 4.64*  CALCIUM 9.4  --  9.0 9.1 9.0  MG 2.5*  --   --   --   --      Liver Function Tests: Recent Labs  Lab 10/09/20 1035 10/11/20 0504  AST 14* 10*  ALT 11 10  ALKPHOS 74 51  BILITOT 0.8 1.2  PROT 7.3 6.0*  ALBUMIN 3.8 3.0*     Antibiotics: Anti-infectives (From admission, onward)   None       DVT prophylaxis: Heparin  Code Status: Full code  Family Communication: No family at bedside   Consultants:  Nephrology  Procedures:      Objective   Vitals:   10/10/20 2007 10/10/20 2009 10/11/20 0527 10/11/20 0900  BP:  (!) 161/52 (!) 164/58 (!) 155/45  Pulse:  (!) 52 (!) 48 (!) 47  Resp:  20 16 16   Temp:  98.1 F (36.7 C) 97.7 F (36.5 C) 97.9 F (36.6 C)  TempSrc:  Oral Oral Oral  SpO2: 97% 98% 96% 97%  Weight:      Height:        Intake/Output Summary (Last 24 hours) at 10/11/2020 1134 Last data filed at 10/11/2020 0900 Gross per 24 hour  Intake 720 ml  Output 1675 ml  Net -955 ml    02/18 1901 - 02/20 0700 In: 1380 [P.O.:1380] Out: 2850 [Urine:2450]  Filed Weights   10/09/20 1847  Weight: 75.7 kg    Physical Examination:   General-appears in no acute distress Heart-S1-S2, regular, no murmur auscultated Lungs-clear to auscultation bilaterally, no wheezing or crackles auscultated Abdomen-soft, nontender, no organomegaly Extremities-no edema in the lower extremities Neuro-alert, oriented x3, no focal deficit noted  Status is: Inpatient  Dispo: The patient is from: Home              Anticipated d/c is to: Home              Anticipated d/c date is: 2/ 21/22              Patient currently not stable for discharge  Barrier to discharge-ongoing treatment for acute kidney injury on CKD stage IV       Data Reviewed:   Recent Results (from the past 240 hour(s))  SARS CORONAVIRUS 2 (TAT 6-24 HRS) Nasopharyngeal Nasopharyngeal Swab     Status: None   Collection Time: 10/09/20 12:22 PM    Specimen: Nasopharyngeal Swab  Result Value Ref Range Status   SARS Coronavirus 2 NEGATIVE NEGATIVE Final    Comment: (NOTE) SARS-CoV-2 target nucleic acids are NOT DETECTED.  The SARS-CoV-2 RNA is generally detectable in upper and lower respiratory specimens during the acute phase of infection. Negative results do not  preclude SARS-CoV-2 infection, do not rule out co-infections with other pathogens, and should not be used as the sole basis for treatment or other patient management decisions. Negative results must be combined with clinical observations, patient history, and epidemiological information. The expected result is Negative.  Fact Sheet for Patients: SugarRoll.be  Fact Sheet for Healthcare Providers: https://www.woods-mathews.com/  This test is not yet approved or cleared by the [place] FDA and  has been authorized for detection and/or diagnosis of SARS-CoV-2 by FDA under an Emergency Use Authorization (EUA). This EUA will remain  in effect (meaning this test can be used) for the duration of the COVID-19 declaration under Se ction 564(b)(1) of the Act, 21 U.S.C. section 360bbb-3(b)(1), unless the authorization is terminated or revoked sooner.  Performed at [facility], [place] [address] [place], [place] [address]     No results for input(s): LIPASE, AMYLASE in the last 168 hours. No results for input(s): AMMONIA in the last 168 hours.  Cardiac Enzymes: No results for input(s): CKTOTAL, CKMB, CKMBINDEX, TROPONINI in the last 168 hours. BNP (last 3 results) Recent Labs    11/28/19 0529 02/23/20 1559 09/16/20 0938  BNP 1,296.2* 1,713.3* 1,223.4*        Oswald Hillock   [facility] If 7PM-7AM, please contact night-coverage at [email or web address], Office  [phone]   10/11/2020, 11:34 AM  LOS: 2 days

## 2020-10-11 NOTE — Plan of Care (Signed)
  Problem: Education: ?Goal: Knowledge of General Education information will improve ?Description: Including pain rating scale, medication(s)/side effects and non-pharmacologic comfort measures ?Outcome: Completed/Met ?  ?Problem: Health Behavior/Discharge Planning: ?Goal: Ability to manage health-related needs will improve ?Outcome: Completed/Met ?  ?Problem: Clinical Measurements: ?Goal: Ability to maintain clinical measurements within normal limits will improve ?Outcome: Completed/Met ?Goal: Will remain free from infection ?Outcome: Completed/Met ?Goal: Diagnostic test results will improve ?Outcome: Completed/Met ?  ?

## 2020-10-12 DIAGNOSIS — I10 Essential (primary) hypertension: Secondary | ICD-10-CM

## 2020-10-12 LAB — COMPREHENSIVE METABOLIC PANEL
ALT: 16 U/L (ref 0–44)
AST: 16 U/L (ref 15–41)
Albumin: 3 g/dL — ABNORMAL LOW (ref 3.5–5.0)
Alkaline Phosphatase: 57 U/L (ref 38–126)
Anion gap: 9 (ref 5–15)
BUN: 104 mg/dL — ABNORMAL HIGH (ref 8–23)
CO2: 26 mmol/L (ref 22–32)
Calcium: 9 mg/dL (ref 8.9–10.3)
Chloride: 105 mmol/L (ref 98–111)
Creatinine, Ser: 4.78 mg/dL — ABNORMAL HIGH (ref 0.61–1.24)
GFR, Estimated: 11 mL/min — ABNORMAL LOW (ref >60)
Glucose, Bld: 168 mg/dL — ABNORMAL HIGH (ref 70–99)
Potassium: 3.7 mmol/L (ref 3.5–5.1)
Sodium: 140 mmol/L (ref 135–145)
Total Bilirubin: 0.3 mg/dL (ref 0.3–1.2)
Total Protein: 6 g/dL — ABNORMAL LOW (ref 6.5–8.1)

## 2020-10-12 LAB — GLUCOSE, CAPILLARY
Glucose-Capillary: 124 mg/dL — ABNORMAL HIGH (ref 70–99)
Glucose-Capillary: 188 mg/dL — ABNORMAL HIGH (ref 70–99)

## 2020-10-12 MED ORDER — LACTULOSE 10 GM/15ML PO SOLN
20.0000 g | Freq: Once | ORAL | Status: AC
  Administered 2020-10-12: 20 g via ORAL
  Filled 2020-10-12: qty 30

## 2020-10-12 MED ORDER — POLYETHYLENE GLYCOL 3350 17 G PO PACK: 17.0000 g | PACK | Freq: Every day | ORAL | 0 refills | Status: DC | PRN

## 2020-10-12 MED ORDER — SACCHAROMYCES BOULARDII 250 MG PO CAPS: 250.0000 mg | ORAL_CAPSULE | Freq: Two times a day (BID) | ORAL | 0 refills | Status: DC

## 2020-10-12 MED ORDER — SODIUM CHLORIDE 0.9 % IV BOLUS
250.0000 mL | Freq: Once | INTRAVENOUS | Status: AC
  Administered 2020-10-12: 250 mL via INTRAVENOUS
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
## 2020-10-12 NOTE — Discharge Summary (Signed)
Physician Discharge Summary  Ordean Fouts ZOX:[id number] DOB: 1935-08-14 DOA: 10/09/2020  PCP: Dorothyann Peng, NP  Admit date: 10/09/2020 Discharge date: 10/12/2020  Time spent: 50* minutes  Recommendations for Outpatient Follow-up:  1. Follow-up nephrology In 1 week  Discharge Diagnoses:  Principal Problem:   Acute on chronic renal failure (HCC) Active Problems:   Diabetes mellitus with renal complications (HCC)   Essential hypertension   Gastrointestinal hemorrhage with melena   Elevated serum immunoglobulin free light chains   CKD (chronic kidney disease), stage IV (HCC)   Mass of right lung   Discharge Condition: Stable  Diet recommendation: Heart healthy diet  Filed Weights   10/09/20 1847  Weight: 75.7 kg    History of present illness:  86 year old male with history of hypertension, HFpEF, diabetes mellitus type 2, CKD stage IV, hyperlipidemia was discharged 2 weeks ago after treatment for acute exacerbation of HFpEF.  Lasix at that time was changed to torsemide and Zaroxolyn was added.  At that time creatinine worsened from 3.02-4.2.  He was deemed okay for discharge by nephrology and follow-up as outpatient.  Patient was seen by nephrology yesterday and noted to have creatinine of 6.2 with elevated BUN.  He was sent to ED for further evaluation.  Patient says that he feels sluggish but no different than he fell 2 weeks ago.  Denies shortness of breath or dyspnea on exertion.   Hospital Course:  1. AKI on CKD stage IV-secondary to overdiuresis.  Diuretics were held, patient was started on LR at 100 ml/hr for 10 hours by nephrology.  Avoid nephrotoxic medications.  BUN/creatinine has improved to 104/4.78.  We will start torsemide 20 mg p.o. twice daily.  Will discontinue metolazone. 2. Hypokalemia-replete 3. Diabetes mellitus type 2-continue glipizide 4. HFpEF-currently euvolemic, diuretics and losartan on hold.  Continue metoprolol.  Does not appear to be volume overload.     BiDil. 5. Right upper lobe lung mass-found at last hospitalization, 2 cm with irregular margins.  Patient was supposed to follow-up with pulmonology as outpatient. 6. Hypertension-blood pressure is elevated, continue home medication including amlodipine, Catapres, metoprolol. 7. History of TIA-continue Plavix 8. GERD-continue pantoprazole 9. Constipation-continue MiraLAX 17 g daily as needed.  Patient said that he has felt bloated and developed constipation since he got antibiotics for infection in the past.  We will start Florastor to 50 mg p.o. twice daily   Procedures:    Consultations:  Nephrology Discharge Exam: Vitals:   10/12/20 0453 10/12/20 1021  BP: (!) 180/50 (!) 164/50  Pulse: (!) 47 (!) 52  Resp: 20 17  Temp: 97.6 F (36.4 C) 98.4 F (36.9 C)  SpO2: 97% 97%    General: Appears in no acute distress Cardiovascular: S1-S2, regular Respiratory: Clear to auscultation bilaterally  Discharge Instructions   Discharge Instructions    Diet - low sodium heart healthy   Complete by: As directed    Increase activity slowly   Complete by: As directed    Increase activity slowly   Complete by: As directed      Allergies as of 10/12/2020      Reactions   Aspirin Other (See Comments)   High doses causes stomach ulcer and bleeding      Medication List    STOP taking these medications   hydrALAZINE 100 MG tablet Commonly known as: APRESOLINE   isosorbide dinitrate 20 MG tablet Commonly known as: ISORDIL   metolazone 2.5 MG tablet Commonly known as: ZAROXOLYN     TAKE these  medications   Accu-Chek Aviva Plus test strip Generic drug: glucose blood Used to check blood glucose BID or PRN What changed:   how much to take  how to take this  when to take this   Accu-Chek Aviva Plus w/Device Kit Used to check blood glucose 2 times a day or PRN   accu-chek multiclix lancets Used to check blood glucose BID or PRN What changed:   how much to  take  how to take this  when to take this   acetaminophen 325 MG tablet Commonly known as: TYLENOL Take 325 mg by mouth 4 (four) times daily.   amLODipine 10 MG tablet Commonly known as: NORVASC TAKE 1 TABLET BY MOUTH EVERY DAY   atorvastatin 20 MG tablet Commonly known as: LIPITOR TAKE 1 TABLET BY MOUTH EVERY DAY   cholecalciferol 25 MCG (1000 UNIT) tablet Commonly known as: VITAMIN D3 Take 1,000 Units by mouth daily.   cloNIDine 0.3 MG tablet Commonly known as: CATAPRES Take 1 tablet (0.3 mg total) by mouth 3 (three) times daily.   clopidogrel 75 MG tablet Commonly known as: PLAVIX TAKE 1 TABLET BY MOUTH EVERY DAY What changed: when to take this   doxazosin 2 MG tablet Commonly known as: CARDURA Take 2 mg by mouth at bedtime.   esomeprazole 40 MG capsule Commonly known as: NEXIUM TAKE 1 CAPSULE BY MOUTH EVERY DAY What changed: how much to take   glipiZIDE 5 MG 24 hr tablet Commonly known as: GLUCOTROL XL Take 1 tablet (5 mg total) by mouth daily. Need diabetic follow up for further refills   hydrocortisone 2.5 % rectal cream Commonly known as: ANUSOL-HC PLACE 1 APPLICATION RECTALLY 2 TIMES A DAY. What changed: See the new instructions.   isosorbide-hydrALAZINE 20-37.5 MG tablet Commonly known as: BIDIL Take 1 tablet by mouth 3 (three) times daily.   Klor-Con M10 10 MEQ tablet Generic drug: potassium chloride Take 2 tablets (20 mEq total) by mouth 2 (two) times daily. What changed: how much to take   lactulose 10 GM/15ML solution Commonly known as: [place] 15 ML BY MOUTH 2 TIMES DAILY AS NEEDED FOR MILD CONSTIPATION. What changed: See the new instructions.   metoprolol succinate 100 MG 24 hr tablet Commonly known as: TOPROL-XL Take 1 tablet (100 mg total) by mouth daily. Take with or immediately following a meal.   Poly-Iron 150 150 MG capsule Generic drug: iron polysaccharides Take 150 mg by mouth daily.   polyethylene glycol 17 g  packet Commonly known as: MIRALAX / GLYCOLAX Take 17 g by mouth daily as needed for mild constipation.   saccharomyces boulardii 250 MG capsule Commonly known as: FLORASTOR Take 1 capsule (250 mg total) by mouth 2 (two) times daily.   SYSTANE BALANCE OP Place 1 drop into both eyes daily as needed (dry eyes).   torsemide 20 MG tablet Commonly known as: DEMADEX Take 1 tablet (20 mg total) by mouth 2 (two) times daily.   triamcinolone 0.1 % Commonly known as: KENALOG Apply 1 application topically daily.      Allergies  Allergen Reactions  . Aspirin Other (See Comments)    High doses causes stomach ulcer and bleeding    Follow-up Information    Nafziger, Tommi Rumps, NP Follow up in 2 week(s).   Specialty: Family Medicine Contact information: [address] [place] [address] [phone]        Claudia Desanctis, MD Follow up in 1 week(s).   Specialty: Nephrology Contact information:  [address] [place] [address] [phone]                The results of significant diagnostics from this hospitalization (including imaging, microbiology, ancillary and laboratory) are listed below for reference.    Significant Diagnostic Studies: DG Chest 2 View  Result Date: 09/16/2020 CLINICAL DATA:  Shortness of breath EXAM: CHEST - 2 VIEW COMPARISON:  February 23, 2020 FINDINGS: There is apparent scarring in the right upper lobe, stable. There is no edema or airspace opacity. There is cardiomegaly with pulmonary vascularity normal. No adenopathy. There is aortic atherosclerosis. No bone lesions. IMPRESSION: Stable scarring right upper lobe. No edema or airspace opacity. Stable cardiomegaly. Aortic Atherosclerosis (ICD10-I70.0). Electronically Signed   By: Lowella Grip III M.D.   On: 09/16/2020 08:09   CT CHEST WO CONTRAST  Result Date: 09/17/2020 CLINICAL DATA:  86 year old male with cough and shortness of breath. EXAM: CT CHEST WITHOUT CONTRAST TECHNIQUE: Multidetector  CT imaging of the chest was performed following the standard protocol without IV contrast. COMPARISON:  Chest CT dated 11/03/2016. FINDINGS: Evaluation of this exam is limited in the absence of intravenous contrast. Cardiovascular: There is mild cardiomegaly. Small pericardial effusion. Three-vessel coronary vascular calcification. There is advanced atherosclerotic calcification of the thoracic aorta. No aneurysmal dilatation. The central pulmonary arteries are grossly unremarkable. Mediastinum/Nodes: No hilar or mediastinal adenopathy. The esophagus and the thyroid gland are grossly unremarkable. No mediastinal fluid collection. Lungs/Pleura: Trace bilateral pleural effusions. There is diffuse interstitial and interlobular septal prominence with diffuse ground-glass opacity throughout the lungs most consistent with edema. Pneumonia is not excluded clinical correlation is recommended. There is background of emphysema. There is a 2.4 x 1.8 cm right upper lobe subpleural nodule with irregular margins. This nodule has increased in size compared to prior study of 2018. Multidisciplinary consult and further evaluation with PET-CT is recommended. There is no pneumothorax. The central airways are patent. Upper Abdomen: There is bilateral adrenal thickening/hyperplasia. Musculoskeletal: Mild diffuse subcutaneous stranding and edema. Osteopenia with degenerative changes of the spine. No acute osseous pathology. IMPRESSION: 1. Cardiomegaly with findings of CHF and trace bilateral pleural effusions. 2. Interval increase in the size of the right upper lobe subpleural nodule compared to the prior study. PET CT is recommended for further evaluation. 3. Aortic Atherosclerosis (ICD10-I70.0) and Emphysema (ICD10-J43.9). Electronically Signed   By: Anner Crete M.D.   On: 09/17/2020 16:01   DG Chest Port 1 View  Result Date: 09/17/2020 CLINICAL DATA:  Congestive heart failure EXAM: PORTABLE CHEST 1 VIEW COMPARISON:   09/16/2020 FINDINGS: The lungs are symmetrically well expanded. The lungs are clear. No pneumothorax or pleural effusion. Cardiac size is mildly enlarged, unchanged. Mild central pulmonary vascular congestion is again seen without overt pulmonary edema. No acute bone abnormality. IMPRESSION: Stable cardiomegaly with central pulmonary vascular congestion. No overt pulmonary edema. Electronically Signed   By: Fidela Salisbury MD   On: 09/17/2020 06:20   VAS [place] LOWER EXTREMITY VENOUS (DVT)  Result Date: 09/18/2020  Lower Venous DVT Study Indications: Elevated D-dimer, hypoxia, inability to do contrasted CT due to renal insuff.  Comparison Study: no prior Performing Technologist: Abram Sander RVS  Examination Guidelines: A complete evaluation includes B-mode imaging, spectral Doppler, color Doppler, and power Doppler as needed of all accessible portions of each vessel. Bilateral testing is considered an integral part of a complete examination. Limited examinations for reoccurring indications may be performed as noted. The reflux portion of the exam is performed with the patient  in reverse Trendelenburg.  +---------+---------------+---------+-----------+----------+--------------+ RIGHT    CompressibilityPhasicitySpontaneityPropertiesThrombus Aging +---------+---------------+---------+-----------+----------+--------------+ CFV      Full           Yes      Yes                                 +---------+---------------+---------+-----------+----------+--------------+ SFJ      Full                                                        +---------+---------------+---------+-----------+----------+--------------+ FV Prox  Full                                                        +---------+---------------+---------+-----------+----------+--------------+ FV Mid   Full                                                         +---------+---------------+---------+-----------+----------+--------------+ FV DistalFull                                                        +---------+---------------+---------+-----------+----------+--------------+ PFV      Full                                                        +---------+---------------+---------+-----------+----------+--------------+ POP      Full           Yes      Yes                                 +---------+---------------+---------+-----------+----------+--------------+ PTV      Full                                                        +---------+---------------+---------+-----------+----------+--------------+ PERO     Full                                                        +---------+---------------+---------+-----------+----------+--------------+   +---------+---------------+---------+-----------+----------+--------------+ LEFT     CompressibilityPhasicitySpontaneityPropertiesThrombus Aging +---------+---------------+---------+-----------+----------+--------------+ CFV      Full           Yes      Yes                                 +---------+---------------+---------+-----------+----------+--------------+  SFJ      Full                                                        +---------+---------------+---------+-----------+----------+--------------+ FV Prox  Full                                                        +---------+---------------+---------+-----------+----------+--------------+ FV Mid   Full                                                        +---------+---------------+---------+-----------+----------+--------------+ FV DistalFull                                                        +---------+---------------+---------+-----------+----------+--------------+ PFV      Full                                                         +---------+---------------+---------+-----------+----------+--------------+ POP      Full           Yes      Yes                                 +---------+---------------+---------+-----------+----------+--------------+ PTV      Full                                                        +---------+---------------+---------+-----------+----------+--------------+ PERO     Full                                                        +---------+---------------+---------+-----------+----------+--------------+     Summary: BILATERAL: - No evidence of deep vein thrombosis seen in the lower extremities, bilaterally. - No evidence of superficial venous thrombosis in the lower extremities, bilaterally. -No evidence of popliteal cyst, bilaterally.   *See table(s) above for measurements and observations. Electronically signed by Jamelle Haring on 09/18/2020 at 5:22:29 PM.    Final     Microbiology: Recent Results (from the past 240 hour(s))  SARS CORONAVIRUS 2 (TAT 6-24 HRS) Nasopharyngeal Nasopharyngeal Swab     Status: None   Collection Time: 10/09/20 12:22 PM   Specimen: Nasopharyngeal Swab  Result Value Ref Range Status   SARS Coronavirus 2 NEGATIVE  NEGATIVE Final    Comment: (NOTE) SARS-CoV-2 target nucleic acids are NOT DETECTED.  The SARS-CoV-2 RNA is generally detectable in upper and lower respiratory specimens during the acute phase of infection. Negative results do not preclude SARS-CoV-2 infection, do not rule out co-infections with other pathogens, and should not be used as the sole basis for treatment or other patient management decisions. Negative results must be combined with clinical observations, patient history, and epidemiological information. The expected result is Negative.  Fact Sheet for Patients: SugarRoll.be  Fact Sheet for Healthcare Providers: https://www.woods-mathews.com/  This test is not yet approved or cleared  by the [place] FDA and  has been authorized for detection and/or diagnosis of SARS-CoV-2 by FDA under an Emergency Use Authorization (EUA). This EUA will remain  in effect (meaning this test can be used) for the duration of the COVID-19 declaration under Se ction 564(b)(1) of the Act, 21 U.S.C. section 360bbb-3(b)(1), unless the authorization is terminated or revoked sooner.  Performed at [facility], [place] [address], [place], [place] [address]      Labs: Basic Metabolic Panel: Recent Labs  Lab 10/09/20 1035 10/09/20 1533 10/09/20 2000 10/10/20 0653 10/11/20 0504 10/12/20 0339  NA 134*  --  137 138 138 140  K 2.9*  --  2.9* 2.8* 3.3* 3.7  CL 93*  --  97* 99 103 105  CO2 27  --  '26 25 25 26  ' GLUCOSE 141*  --  175* 130* 147* 168*  BUN 137*  --  130* 126* 112* 104*  CREATININE 6.24* 5.86* 5.38* 5.01* 4.64* 4.78*  CALCIUM 9.4  --  9.0 9.1 9.0 9.0  MG 2.5*  --   --   --   --   --    Liver Function Tests: Recent Labs  Lab 10/09/20 1035 10/11/20 0504 10/12/20 0339  AST 14* 10* 16  ALT '11 10 16  ' ALKPHOS 74 51 57  BILITOT 0.8 1.2 0.3  PROT 7.3 6.0* 6.0*  ALBUMIN 3.8 3.0* 3.0*   No results for input(s): LIPASE, AMYLASE in the last 168 hours. No results for input(s): AMMONIA in the last 168 hours. CBC: Recent Labs  Lab 10/09/20 1035 10/09/20 1533 10/10/20 0653  WBC 8.4 7.6 6.8  NEUTROABS 6.6  --   --   HGB 10.8* 10.3* 10.1*  HCT 33.4* 30.9* 29.1*  MCV 82.1 82.2 79.9*  PLT 325 287 285   Cardiac Enzymes: No results for input(s): CKTOTAL, CKMB, CKMBINDEX, TROPONINI in the last 168 hours. BNP: BNP (last 3 results) Recent Labs    11/28/19 0529 02/23/20 1559 09/16/20 0938  BNP 1,296.2* 1,713.3* 1,223.4*    ProBNP (last 3 results) No results for input(s): PROBNP in the last 8760 hours.  CBG: Recent Labs  Lab 10/11/20 0639 10/11/20 1118 10/11/20 1623 10/12/20 0639 10/12/20 1139  GLUCAP 153* 149* 140* 124* 188*       Signed:  Oswald Hillock MD.  [facility] 10/12/2020, 2:26 PM

## 2020-10-12 NOTE — Plan of Care (Signed)
  Problem: Clinical Measurements: Goal: Respiratory complications will improve Outcome: Adequate for Discharge Goal: Cardiovascular complication will be avoided Outcome: Adequate for Discharge   Problem: Activity: Goal: Risk for activity intolerance will decrease Outcome: Adequate for Discharge   Problem: Nutrition: Goal: Adequate nutrition will be maintained Outcome: Adequate for Discharge   Problem: Coping: Goal: Level of anxiety will decrease Outcome: Adequate for Discharge   Problem: Elimination: Goal: Will not experience complications related to bowel motility Outcome: Adequate for Discharge Goal: Will not experience complications related to urinary retention Outcome: Adequate for Discharge   Problem: Pain Managment: Goal: General experience of comfort will improve Outcome: Adequate for Discharge   Problem: Safety: Goal: Ability to remain free from injury will improve Outcome: Adequate for Discharge   Problem: Skin Integrity: Goal: Risk for impaired skin integrity will decrease Outcome: Adequate for Discharge   

## 2020-10-14 ENCOUNTER — Telehealth: Payer: Self-pay | Admitting: Adult Health

## 2020-10-14 NOTE — Telephone Encounter (Signed)
Transition Care Management Follow-up Telephone Call  Date of discharge and from where: 10/12/2020 [facility]   How have you been since you were released from the hospital? Patient states he is doing good.   Any questions or concerns? No  Items Reviewed:  Did the pt receive and understand the discharge instructions provided? Yes   Medications obtained and verified? Yes   Other? No   Any new allergies since your discharge? No   Dietary orders reviewed? Yes  Do you have support at home? Yes   Home Care and Equipment/Supplies: Were home health services ordered? no If so, what is the name of the agency? N/A  Has the agency set up a time to come to the patient's home? not applicable Were any new equipment or medical supplies ordered?  No What is the name of the medical supply agency? No Were you able to get the supplies/equipment? no Do you have any questions related to the use of the equipment or supplies? No  Functional Questionnaire: (I = Independent and D = Dependent) ADLs: I  Bathing/Dressing- I  Meal Prep- I  Eating- I  Maintaining continence- I  Transferring/Ambulation- I  Managing Meds- I  Follow up appointments reviewed:   PCP Hospital f/u appt confirmed? Yes  Scheduled to see Dorothyann Peng  on 10/27/2020 @ 2:00 PM.  [facility] f/u appt confirmed? No    Are transportation arrangements needed? No   If their condition worsens, is the pt aware to call PCP or go to the Emergency Dept.? Yes  Was the patient provided with contact information for the PCP's office or ED? Yes  Was to pt encouraged to call back with questions or concerns? Yes

## 2020-10-17 ENCOUNTER — Other Ambulatory Visit: Payer: Self-pay | Admitting: Adult Health

## 2020-10-22 ENCOUNTER — Other Ambulatory Visit: Payer: Self-pay

## 2020-10-22 ENCOUNTER — Encounter: Payer: Self-pay | Admitting: Emergency Medicine

## 2020-10-22 ENCOUNTER — Other Ambulatory Visit: Payer: Self-pay | Admitting: Adult Health

## 2020-10-22 ENCOUNTER — Ambulatory Visit: Payer: Medicare PPO | Admitting: Emergency Medicine

## 2020-10-22 DIAGNOSIS — R911 Solitary pulmonary nodule: Secondary | ICD-10-CM | POA: Diagnosis not present

## 2020-10-22 DIAGNOSIS — R918 Other nonspecific abnormal finding of lung field: Secondary | ICD-10-CM | POA: Insufficient documentation

## 2020-10-22 NOTE — Progress Notes (Signed)
Subjective:    Patient ID: Austin [name], male    DOB: [birth year], 86 y.o.   MRN: [id number]  HPI 86 year old former smoker (10 pack years) with a history of hypertension with diastolic heart failure, diabetes, CKD stage IV, hyperlipidemia, carotid and cerebrovascular disease (Plavix), prostate cancer.  He has been in and out of the hospital in January and February 2022 for exacerbations of diastolic CHF and volume overload.  As part of his evaluation he was found to have a right upper lobe 2 cm nodule.  He presents today for further evaluation of this. His S Cr 4.78.   Denies any cough, CP, dyspnea  CT chest 09/17/2020 reviewed by me, shows diffuse interstitial and septal groundglass consistent with pulmonary edema, trace bilateral effusions, background emphysema, and a 2.4 x 1.8 cm subpleural right upper lobe nodule with irregular margins, increased in size compared with a CT done 10/2016.    Review of Systems As per HPI  Past Medical History:  Diagnosis Date  . 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 CAROTID ARTERY STENOSIS 05/10/2010  . CHF (congestive heart failure) ([place])   . DIABETES MELLITUS, TYPE II 09/19/2007  . DISEASE, CEREBROVASCULAR NEC 03/05/2007  . GERD 03/13/2007  . HYPERLIPIDEMIA 03/05/2007  . HYPERTENSION 03/05/2007  . HYPOKALEMIA 11/09/2009  . KNEE PAIN, RIGHT 11/09/2009  . PROSTATE CANCER, HX OF 03/05/2007  . RENAL DISEASE, CHRONIC 02/03/2009     Family History  Problem Relation Age of Onset  . Hypertension Mother   . Cancer Father        Mesothelioma   . Stomach cancer Brother   . Cancer Brother   . Cancer - Cervical Brother   . Cancer Brother   . Diabetes Brother   . Esophageal cancer Neg Hx   . Colon cancer Neg Hx   . Pancreatic cancer Neg Hx      Social History   Socioeconomic History  . Marital status: Married    Spouse name: Not on file  . Number of children: Not on file  . Years of education: Not on file  . Highest education level: Not on file   Occupational History  . Not on file  Tobacco Use  . Smoking status: Former Smoker    Types: Cigarettes    Quit date: 08/22/1974    Years since quitting: 46.2  . Smokeless tobacco: Never Used  Vaping Use  . Vaping Use: Never used  Substance and Sexual Activity  . Alcohol use: No    Alcohol/week: 0.0 standard drinks  . Drug use: No  . Sexual activity: Not on file  Other Topics Concern  . Not on file  Social History Narrative   Retired - Environmental consultant    Married 65 years       He enjoys traveling    Investment banker, operational of Radio broadcast assistant Strain: Not on [employer] Insecurity: Not on file  Transportation Needs: Not on file  Physical Activity: Not on file  Stress: Not on file  Social Connections: Not on file  Intimate Partner Violence: Not on file    He was a former Environmental consultant Was in the [employer], stationed in [place], no inhaled exposures  Allergies  Allergen Reactions  . Aspirin Other (See Comments)    High doses causes stomach ulcer and bleeding     Outpatient Medications Prior to Visit  Medication Sig Dispense Refill  . ACCU-CHEK GUIDE test strip USED TO CHECK BLOOD GLUCOSE TWICE  A DAY OR AS NEEDED 100 strip 1  . acetaminophen (TYLENOL) 325 MG tablet Take 325 mg by mouth 4 (four) times daily.    Marland Kitchen amLODipine (NORVASC) 10 MG tablet TAKE 1 TABLET BY MOUTH EVERY DAY (Patient taking differently: Take 10 mg by mouth daily.) 90 tablet 1  . atorvastatin (LIPITOR) 20 MG tablet TAKE 1 TABLET BY MOUTH EVERY DAY (Patient taking differently: Take 20 mg by mouth daily.) 90 tablet 1  . Blood Glucose Monitoring Suppl (ACCU-CHEK AVIVA PLUS) w/Device KIT Used to check blood glucose 2 times a day or PRN 1 kit 0  . cholecalciferol (VITAMIN D3) 25 MCG (1000 UNIT) tablet Take 1,000 Units by mouth daily.    . cloNIDine (CATAPRES) 0.3 MG tablet Take 1 tablet (0.3 mg total) by mouth 3 (three) times daily. 60 tablet 0  . clopidogrel (PLAVIX) 75 MG tablet TAKE 1 TABLET BY MOUTH  EVERY DAY (Patient taking differently: Take 75 mg by mouth daily with supper.) 90 tablet 3  . doxazosin (CARDURA) 2 MG tablet Take 2 mg by mouth at bedtime.   3  . esomeprazole (NEXIUM) 40 MG capsule TAKE 1 CAPSULE BY MOUTH EVERY DAY (Patient taking differently: Take 40 mg by mouth daily.) 90 capsule 3  . glipiZIDE (GLUCOTROL XL) 5 MG 24 hr tablet Take 1 tablet (5 mg total) by mouth daily. Need diabetic follow up for further refills 30 tablet 0  . hydrocortisone (ANUSOL-HC) 2.5 % rectal cream PLACE 1 APPLICATION RECTALLY 2 TIMES A DAY. (Patient taking differently: Place 1 application rectally daily.) 30 g 0  . isosorbide-hydrALAZINE (BIDIL) 20-37.5 MG tablet Take 1 tablet by mouth 3 (three) times daily.    Marland Kitchen lactulose (CHRONULAC) 10 GM/15ML solution TAKE 15 ML BY MOUTH 2 TIMES DAILY AS NEEDED FOR MILD CONSTIPATION. (Patient taking differently: Take 10 g by mouth See admin instructions. Take 15 mls (10 g) by mouth with breakfast and supper every other day) 946 mL 3  . Lancets (ACCU-CHEK MULTICLIX) lancets Used to check blood glucose BID or PRN (Patient taking differently: 1 each by Other route See admin instructions. Used to check blood glucose BID or PRN) 100 each 12  . POLY-IRON 150 150 MG capsule Take 150 mg by mouth daily.    . polyethylene glycol (MIRALAX / GLYCOLAX) 17 g packet Take 17 g by mouth daily as needed for mild constipation. 14 each 0  . Propylene Glycol (SYSTANE BALANCE OP) Place 1 drop into both eyes daily as needed (dry eyes).    . saccharomyces boulardii (FLORASTOR) 250 MG capsule Take 1 capsule (250 mg total) by mouth 2 (two) times daily. 30 capsule 0  . triamcinolone cream (KENALOG) 0.1 % Apply 1 application topically daily.    Marland Kitchen KLOR-CON M10 10 MEQ tablet Take 2 tablets (20 mEq total) by mouth 2 (two) times daily. (Patient taking differently: Take 10 mEq by mouth 2 (two) times daily.) 120 tablet 0  . metoprolol succinate (TOPROL-XL) 100 MG 24 hr tablet Take 1 tablet (100 mg total)  by mouth daily. Take with or immediately following a meal. 30 tablet 0  . torsemide (DEMADEX) 20 MG tablet Take 1 tablet (20 mg total) by mouth 2 (two) times daily. 60 tablet 0   Facility-Administered Medications Prior to Visit  Medication Dose Route Frequency Provider Last Rate Last Admin  . ammonium lactate (LAC-HYDRIN) 12 % lotion   Topical PRN Felipa Furnace, DPM  Objective:   Physical Exam Vitals:   10/22/20 1058  BP: 130/70  Pulse: (!) 52  Temp: 97.7 F (36.5 C)  TempSrc: Temporal  SpO2: 99%  Weight: 172 lb 6.4 oz (78.2 kg)  Height: _0  (1.702 m)   Gen: Pleasant, well-nourished, in no distress,  normal affect  ENT: No lesions,  mouth clear,  oropharynx clear, no postnasal drip  Neck: No JVD, no stridor  Lungs: No use of accessory muscles, no crackles or wheezing on normal respiration, no wheeze on forced expiration  Cardiovascular: RRR, heart sounds normal, no murmur or gallops, no peripheral edema  Musculoskeletal: No deformities, no cyanosis or clubbing  Neuro: alert, awake, non focal  Skin: Warm, no lesions or rash     Assessment & Plan:  Pulmonary nodule 1 cm or greater in diameter Spiculated pulmonary nodule that is increased in size since 2018.  Very suspicious for slow-growing non-small cell lung cancer.  I think he needs bronchoscopy (ENB and EBUS staging) and PET scan.  We discussed this today.  He is open to the procedure but wants to see the PET scan results first.  I will order the PET and see him in a few weeks.  At that time we will talk about setting up a procedure.  He will need to come off of his Plavix for 5 days.. He does carry some significant risk due to his renal disease, heart disease.  Will discuss with Dr. Terrence Dupont before we arrange a procedure  Baltazar Apo, MD, PhD 10/22/2020, 11:49 AM [facility] [phone] or if no answer before 7:00PM call [phone] For any issues after 7:00PM please call eLink  [phone]

## 2020-10-22 NOTE — Addendum Note (Signed)
Addended by: Gavin Potters R on: 10/22/2020 12:08 PM   Modules accepted: Orders

## 2020-10-22 NOTE — Assessment & Plan Note (Signed)
Spiculated pulmonary nodule that is increased in size since 2018.  Very suspicious for slow-growing non-small cell lung cancer.  I think he needs bronchoscopy (ENB and EBUS staging) and PET scan.  We discussed this today.  He is open to the procedure but wants to see the PET scan results first.  I will order the PET and see him in a few weeks.  At that time we will talk about setting up a procedure.  He will need to come off of his Plavix for 5 days.. He does carry some significant risk due to his renal disease, heart disease.  Will discuss with Dr. Terrence Dupont before we arrange a procedure

## 2020-10-22 NOTE — Patient Instructions (Signed)
We will arrange for a PET scan to further evaluate your right upper lobe pulmonary nodule.  We discussed the possible bronchoscopy to biopsy the pulmonary nodule.  We will revisit this at your next appointment. Follow with [name] next available after your PET so that we can review the results together.

## 2020-10-26 ENCOUNTER — Other Ambulatory Visit: Payer: Self-pay

## 2020-10-26 ENCOUNTER — Telehealth: Payer: Self-pay | Admitting: Emergency Medicine

## 2020-10-26 DIAGNOSIS — R918 Other nonspecific abnormal finding of lung field: Secondary | ICD-10-CM

## 2020-10-26 NOTE — Telephone Encounter (Signed)
Pt states he has several questions about CT that RB wants pt to have. Pt states he had like 10 minutes to make decisions about his future and that wasn't enough time. One question being is the scan necessary before the biopsy. Please advise- pt states a message may be left. Please advise [phone]

## 2020-10-26 NOTE — Telephone Encounter (Signed)
Attempted to call pt but received a busy signal. Tried to call again but still received a busy signal. Will try to call back later. 

## 2020-10-27 ENCOUNTER — Other Ambulatory Visit: Payer: Self-pay

## 2020-10-27 ENCOUNTER — Encounter: Payer: Self-pay | Admitting: Adult Health

## 2020-10-27 ENCOUNTER — Ambulatory Visit: Payer: Medicare PPO | Admitting: Adult Health

## 2020-10-27 VITALS — BP 150/66 | HR 51 | Temp 98.6°F | Ht 67.0 in | Wt 175.4 lb

## 2020-10-27 DIAGNOSIS — R918 Other nonspecific abnormal finding of lung field: Secondary | ICD-10-CM

## 2020-10-27 DIAGNOSIS — E1122 Type 2 diabetes mellitus with diabetic chronic kidney disease: Secondary | ICD-10-CM | POA: Diagnosis not present

## 2020-10-27 DIAGNOSIS — N184 Chronic kidney disease, stage 4 (severe): Secondary | ICD-10-CM

## 2020-10-27 DIAGNOSIS — I5033 Acute on chronic diastolic (congestive) heart failure: Secondary | ICD-10-CM

## 2020-10-27 DIAGNOSIS — I10 Essential (primary) hypertension: Secondary | ICD-10-CM

## 2020-10-27 NOTE — Telephone Encounter (Signed)
Spoke with pt who has questions regarding the PET scan ordered during his last OV. He wants to know if the PET is required to proceed with the bronchoscopy, and he is also concerned about the use of dye during the PET and his ability to clear the dye related to decreased kidney function. Pt also stated feeling"twinge" in right lung randomly that he had not felt before. When asked to further define feeling in chest pt stated he could no describe it. [name] please advise on pt's concerns.

## 2020-10-27 NOTE — Progress Notes (Signed)
Subjective:    Patient ID: Austin [name], male    DOB: [birth year], 86 y.o.   MRN: [id number]  HPI 86 year old male who  has a past medical history of ANEMIA DUE TO CHRONIC BLOOD LOSS (03/13/2007), CAROTID ARTERY STENOSIS (05/10/2010), CHF (congestive heart failure) ([place]), DIABETES MELLITUS, TYPE II (09/19/2007), DISEASE, CEREBROVASCULAR NEC (03/05/2007), GERD (03/13/2007), HYPERLIPIDEMIA (03/05/2007), HYPERTENSION (03/05/2007), HYPOKALEMIA (11/09/2009), KNEE PAIN, RIGHT (11/09/2009), PROSTATE CANCER, HX OF (03/05/2007), and RENAL DISEASE, CHRONIC (02/03/2009).  He presents to the clinic today for TCM visit   Was discharged from the hospital 2 weeks prior to this hospital admission after treatment for acute exacerbation of heart failure.  Lasix at that time was changed to torsemide and Zaroxolyn was added.  At this time creatinine worsened from 3.02-4.2.  He was deemed okay for discharge by nephrology and follow-up as outpatient.  When he followed up with outpatient he was noted to have a creatinine of 6.2 with elevated BUN.  He was sent to the emergency room for further evaluation.  Per ER report the patient stated that he was feeling sluggish but no different than in he felt 2 weeks ago.  He denied shortness of breath or dyspnea on exertion  Hospital Course   1.  AKA on CKD stage V-secondary to overdiuresis.  Diuretics were held and patient was started on lactated Ringer's at 100 mL an hour for 10 hours by nephrology.  His BUN/creatinine improved to 4.78/104.  He was started on torsemide 20 mg twice daily upon discharge.  2. Hypokalemia -was replenished in the hospital  3.  Diabetes mellitus-continued on glipizide  4.  Heart failure-was euvolemic, diuretics and losartan were held.  He was continued on metoprolol  5.  Right Upper lung lobe lung mass-found during last hospitalization.  Patient was supposed to follow-up with pulmonary as outpatient  6.  Hypertension-continued on home medications including  Norvasc, clonidine, and metoprolol  7.  History of TIA-tinea with Plavix  8.  GERD-Continue  with Protonix  Today he reports that he is feeling back to baseline but continues to be slightly fatigued.  He has been seen by nephrology since being discharged from the hospital and reports that his creatinine was "for something".  He returns for blood work in the next couple weeks.  Home his blood pressures have been better, usually in the 768G systolic.  He denies shortness of breath or chest pain  Additionally he was seen by pulmonary, Dr. Malvin Johns and there is some suspicion for slow-growing non-small cell lung cancer.  Pulmonary recommended bronchoscopy and a PET scan.  Patient is open to the procedure but wanted the PET scan results first.  Today he reports that he ended up canceling PET scan for the time being as he is concerned that the "dye" used in the PET scan may harm his kidneys or his heart.  He did send pulmonary note and is waiting to hear back.   Review of Systems  Constitutional: Positive for fatigue.  HENT: Negative.   Eyes: Negative.   Respiratory: Negative.   Cardiovascular: Negative.   Gastrointestinal: Negative.   Endocrine: Negative.   Genitourinary: Negative.   Musculoskeletal: Negative.   Skin: Negative.   Allergic/Immunologic: Negative.   Neurological: Negative.   Hematological: Negative.   Psychiatric/Behavioral: Negative.   All other systems reviewed and are negative.   Past Medical History:  Diagnosis Date  . 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 CAROTID ARTERY STENOSIS 05/10/2010  . CHF (  congestive heart failure) ([place])   . DIABETES MELLITUS, TYPE II 09/19/2007  . DISEASE, CEREBROVASCULAR NEC 03/05/2007  . GERD 03/13/2007  . HYPERLIPIDEMIA 03/05/2007  . HYPERTENSION 03/05/2007  . HYPOKALEMIA 11/09/2009  . KNEE PAIN, RIGHT 11/09/2009  . PROSTATE CANCER, HX OF 03/05/2007  . RENAL DISEASE, CHRONIC 02/03/2009    Social History   Socioeconomic History  .  Marital status: Married    Spouse name: Not on file  . Number of children: Not on file  . Years of education: Not on file  . Highest education level: Not on file  Occupational History  . Not on file  Tobacco Use  . Smoking status: Former Smoker    Types: Cigarettes    Quit date: 08/22/1974    Years since quitting: 46.2  . Smokeless tobacco: Never Used  Vaping Use  . Vaping Use: Never used  Substance and Sexual Activity  . Alcohol use: No    Alcohol/week: 0.0 standard drinks  . Drug use: No  . Sexual activity: Not on file  Other Topics Concern  . 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 CAROTID ARTERY ANGIOPLASTY Right Oct. 10, 2001  . ESOPHAGOGASTRODUODENOSCOPY (EGD) WITH PROPOFOL N/A 11/04/2016   Procedure: ESOPHAGOGASTRODUODENOSCOPY (EGD) WITH PROPOFOL;  Surgeon: Otis Brace, MD;  Location: [facility];  Service: Gastroenterology;  Laterality: N/A;  . LAPAROSCOPIC APPENDECTOMY  02/20/2012   Procedure: APPENDECTOMY LAPAROSCOPIC;  Surgeon: Stark Klein, MD;  Location: [facility] OR;  Service: General;  Laterality: N/A;  . PROSTATE SURGERY     prostatectomy    Family History  Problem Relation Age of Onset  . Hypertension Mother   . Cancer Father        Mesothelioma   . Stomach cancer Brother   . Cancer Brother   . Cancer - Cervical Brother   . Cancer Brother   . Diabetes Brother   . Esophageal cancer Neg Hx   . Colon cancer Neg Hx   . Pancreatic cancer Neg Hx     Allergies  Allergen Reactions  . Aspirin Other (See Comments)    High doses causes stomach ulcer and bleeding     Current Outpatient Medications on File Prior to Visit  Medication Sig Dispense Refill  . ACCU-CHEK GUIDE test strip USED TO CHECK BLOOD GLUCOSE TWICE A DAY OR AS NEEDED 100 strip 1  . acetaminophen (TYLENOL) 325 MG tablet Take 325 mg by mouth 4 (four) times daily.    Marland Kitchen amLODipine (NORVASC) 10 MG tablet TAKE 1 TABLET BY MOUTH EVERY DAY (Patient taking differently: Take 10 mg by mouth daily.) 90 tablet 1  . atorvastatin (LIPITOR) 20 MG tablet TAKE 1 TABLET BY MOUTH EVERY DAY (Patient taking differently: Take 20 mg by mouth daily.) 90 tablet 1  . Blood Glucose Monitoring Suppl (ACCU-CHEK AVIVA PLUS) w/Device KIT Used to check blood glucose 2 times a day or PRN 1 kit 0  . cholecalciferol (VITAMIN D3) 25 MCG (1000 UNIT) tablet Take 1,000 Units by mouth daily.    Marland Kitchen  cloNIDine (CATAPRES) 0.3 MG tablet Take 1 tablet (0.3 mg total) by mouth 3 (three) times daily. 60 tablet 0  . clopidogrel (PLAVIX) 75 MG tablet TAKE 1 TABLET BY MOUTH EVERY DAY (Patient taking differently: Take 75 mg by mouth daily with supper.) 90 tablet 3  . doxazosin (CARDURA) 2 MG tablet Take 2 mg by mouth at bedtime.   3  . esomeprazole (NEXIUM) 40 MG capsule TAKE 1 CAPSULE BY MOUTH EVERY DAY (Patient taking differently: Take 40 mg by mouth daily.) 90 capsule 3  . glipiZIDE (GLUCOTROL XL) 5 MG 24 hr tablet TAKE 1 TABLET BY MOUTH EVERY DAY 90 tablet 0  . hydrocortisone (ANUSOL-HC) 2.5 % rectal cream PLACE 1 APPLICATION RECTALLY 2 TIMES A DAY. (Patient taking differently: Place 1 application rectally daily.) 30 g 0  . isosorbide-hydrALAZINE (BIDIL) 20-37.5 MG tablet Take 1 tablet by mouth 3 (three) times daily.    Marland Kitchen lactulose (CHRONULAC) 10 GM/15ML solution TAKE 15 ML BY MOUTH 2 TIMES DAILY AS NEEDED FOR MILD CONSTIPATION. (Patient taking differently: Take 10 g by mouth See admin instructions. Take 15 mls (10 g) by mouth with breakfast and supper every other day) 946 mL 3  . Lancets (ACCU-CHEK MULTICLIX) lancets Used to check blood  glucose BID or PRN (Patient taking differently: 1 each by Other route See admin instructions. Used to check blood glucose BID or PRN) 100 each 12  . polyethylene glycol (MIRALAX / GLYCOLAX) 17 g packet Take 17 g by mouth daily as needed for mild constipation. 14 each 0  . Propylene Glycol (SYSTANE BALANCE OP) Place 1 drop into both eyes daily as needed (dry eyes).    . saccharomyces boulardii (FLORASTOR) 250 MG capsule Take 1 capsule (250 mg total) by mouth 2 (two) times daily. 30 capsule 0  . torsemide (DEMADEX) 20 MG tablet Take 1 tablet (20 mg total) by mouth 2 (two) times daily. 60 tablet 0  . triamcinolone cream (KENALOG) 0.1 % Apply 1 application topically daily.    Marland Kitchen KLOR-CON M10 10 MEQ tablet Take 2 tablets (20 mEq total) by mouth 2 (two) times daily. (Patient taking differently: Take 10 mEq by mouth 2 (two) times daily.) 120 tablet 0  . metoprolol succinate (TOPROL-XL) 100 MG 24 hr tablet Take 1 tablet (100 mg total) by mouth daily. Take with or immediately following a meal. 30 tablet 0  . POLY-IRON 150 150 MG capsule Take 150 mg by mouth daily. (Patient not taking: Reported on 10/27/2020)     Current Facility-Administered Medications on File Prior to Visit  Medication Dose Route Frequency Provider Last Rate Last Admin  . ammonium lactate (LAC-HYDRIN) 12 % lotion   Topical PRN Boneta Lucks P, DPM        BP (!) 150/66 (BP Location: Left Arm, Patient Position: Sitting, Cuff Size: Normal)   Pulse (!) 51   Temp 98.6 F (37 C) (Oral)   Ht _0  (1.702 m)   Wt 175 lb 6 oz (79.5 kg)   SpO2 97%   BMI 27.47 kg/m       Objective:   Physical Exam Vitals and nursing note reviewed.  Constitutional:      Appearance: Normal appearance.  Cardiovascular:     Rate and Rhythm: Normal rate and regular rhythm.     Pulses: Normal pulses.     Heart sounds: Normal heart sounds.  Pulmonary:     Effort: Pulmonary effort is normal.     Breath sounds: Normal breath sounds.  Musculoskeletal:  General: Normal range of motion.     Right lower leg: Edema (trace) present.     Left lower leg: Edema (trace) present.  Skin:    General: Skin is warm and dry.     Capillary Refill: Capillary refill takes less than 2 seconds.  Neurological:     General: No focal deficit present.     Mental Status: He is alert and oriented to person, place, and time.  Psychiatric:        Mood and Affect: Mood normal.        Behavior: Behavior normal.        Thought Content: Thought content normal.        Judgment: Judgment normal.        Assessment & Plan:  1. CKD (chronic kidney disease), stage IV [facility]) -Reviewed his hospital admission notes and discharge instructions.  All questions answered to the best of my ability.  Advise follow-up with nephrology as directed.  Continue current medications.  2. Mass of right lung -Advised that the radioactive tracer reason PET scan should not harm his kidneys.  3. Type 2 diabetes mellitus with stage 4 chronic kidney disease, without long-term current use of insulin (HCC) - Continue glipizide   4. Acute on chronic diastolic congestive heart failure ([place]) - Appears Evolemic today  - Continue with current medications   5. Essential hypertension - BP up slightly today  - Continue to monitor   Dorothyann Peng, NP

## 2020-11-02 NOTE — Telephone Encounter (Signed)
Discussed the issues with the patient. He agrees to proceed with PET scan, and then follow with me to determine next best steps in the eval, probably FOB + EBUS + ENB.   Please set him up for PET scan. Thanks.

## 2020-11-02 NOTE — Telephone Encounter (Signed)
PET ordered and pt aware to be expecting a phone call to schedule.

## 2020-11-05 ENCOUNTER — Ambulatory Visit (HOSPITAL_COMMUNITY): Payer: Medicare PPO

## 2020-11-10 ENCOUNTER — Encounter: Payer: Self-pay | Admitting: Emergency Medicine

## 2020-11-10 ENCOUNTER — Other Ambulatory Visit: Payer: Self-pay

## 2020-11-10 ENCOUNTER — Ambulatory Visit: Payer: Medicare PPO | Admitting: Emergency Medicine

## 2020-11-10 DIAGNOSIS — R911 Solitary pulmonary nodule: Secondary | ICD-10-CM | POA: Diagnosis not present

## 2020-11-10 NOTE — Progress Notes (Signed)
   Subjective:    Patient ID: Austin [name], male    DOB: 1935/04/12, 86 y.o.   MRN: [id number]  HPI 86 year old former smoker (10 pack years) with a history of hypertension with diastolic heart failure, diabetes, CKD stage IV, hyperlipidemia, carotid and cerebrovascular disease (Plavix), prostate cancer.  He has been in and out of the hospital in January and February 2022 for exacerbations of diastolic CHF and volume overload.  As part of his evaluation he was found to have a right upper lobe 2 cm nodule.  He presents today for further evaluation of this. His S Cr 4.78.   Denies any cough, CP, dyspnea  CT chest 09/17/2020 reviewed by me, shows diffuse interstitial and septal groundglass consistent with pulmonary edema, trace bilateral effusions, background emphysema, and a 2.4 x 1.8 cm subpleural right upper lobe nodule with irregular margins, increased in size compared with a CT done 10/2016.   ROV 11/10/20 --follow-up visit for 86 year old former smoker (10 pack years) with history as above including diastolic CHF and chronic renal insufficiency stage IV.  He has a right upper lobe 2 cm nodule that is increasing in size on serial CT scans of the chest.  We had talked about getting a PET scan to help guide decision-making regarding bronchoscopy.  He understandably was concerned regarding possible risk of PET scan and his renal function.  Returns today to discuss further. He reports that every now and then he gets a "twinge" on the R, seems to be random. Notes that he was probably exposed to asbestos as a youth. Also that there is a hx of lung CA in the family. He sees Dr Terrence Dupont for cardiology, was cleared for procedure a couple weeks ago. He also sees Dr Royce Macadamia with Nephrology.    Review of Systems As per HPI      Objective:   Physical Exam Vitals:   11/10/20 0931  BP: (!) 144/70  Pulse: (!) 51  Temp: 97.6 F (36.4 C)  TempSrc: Temporal  SpO2: 99%  Weight: 176 lb 3.2 oz (79.9 kg)  Height:  5\' 7"  (1.702 m)   Gen: Pleasant, well-nourished, in no distress,  normal affect  ENT: No lesions,  mouth clear,  oropharynx clear, no postnasal drip  Neck: No JVD, no stridor  Lungs: No use of accessory muscles, no crackles or wheezing on normal respiration, no wheeze on forced expiration  Cardiovascular: RRR, heart sounds normal, no murmur or gallops, no peripheral edema  Musculoskeletal: No deformities, no cyanosis or clubbing  Neuro: alert, awake, non focal  Skin: Warm, no lesions or rash     Assessment & Plan:  Pulmonary nodule 1 cm or greater in diameter Reviewed films together.  Discussed plan and all questions answered.  There is no renal risk associated with the PET scan and I reassured him about this.  Get your PET scan later this month as planned We will review the PET scan next available after its completed and discuss possible bronchoscopy to better evaluate your pulmonary nodule Follow with Dr. Royce Macadamia with nephrology for your lab work. We will check with Dr. Terrence Dupont to ensure that he believes we can proceed with bronchoscopy if indicated  Baltazar Apo, MD, PhD 11/10/2020, 10:03 AM [facility] [phone] or if no answer before 7:00PM call [phone] For any issues after 7:00PM please call eLink [phone]

## 2020-11-10 NOTE — Assessment & Plan Note (Signed)
Reviewed films together.  Discussed plan and all questions answered.  There is no renal risk associated with the PET scan and I reassured him about this.  Get your PET scan later this month as planned We will review the PET scan next available after its completed and discuss possible bronchoscopy to better evaluate your pulmonary nodule Follow with Dr. Royce Macadamia with nephrology for your lab work. We will check with Dr. Terrence Dupont to ensure that he believes we can proceed with bronchoscopy if indicated

## 2020-11-10 NOTE — Patient Instructions (Signed)
Get your PET scan later this month as planned We will review the PET scan next available after its completed and discuss possible bronchoscopy to better evaluate your pulmonary nodule Follow with Dr. Royce Macadamia with nephrology for your lab work. We will check with Dr. Terrence Dupont to ensure that he believes we can proceed with bronchoscopy if indicated

## 2020-11-16 ENCOUNTER — Ambulatory Visit (HOSPITAL_COMMUNITY)
Admission: RE | Admit: 2020-11-16 | Discharge: 2020-11-16 | Disposition: A | Discharge status: Home / Self Care | Payer: Medicare PPO | Source: Ambulatory Visit | Attending: Emergency Medicine | Admitting: Emergency Medicine

## 2020-11-16 ENCOUNTER — Other Ambulatory Visit: Payer: Self-pay

## 2020-11-16 DIAGNOSIS — R918 Other nonspecific abnormal finding of lung field: Secondary | ICD-10-CM | POA: Insufficient documentation

## 2020-11-16 DIAGNOSIS — D3502 Benign neoplasm of left adrenal gland: Secondary | ICD-10-CM | POA: Diagnosis not present

## 2020-11-16 DIAGNOSIS — I700 Atherosclerosis of aorta: Secondary | ICD-10-CM | POA: Diagnosis not present

## 2020-11-16 DIAGNOSIS — R911 Solitary pulmonary nodule: Secondary | ICD-10-CM

## 2020-11-16 DIAGNOSIS — D3501 Benign neoplasm of right adrenal gland: Secondary | ICD-10-CM | POA: Insufficient documentation

## 2020-11-16 LAB — GLUCOSE, CAPILLARY: Glucose-Capillary: 129 mg/dL — ABNORMAL HIGH (ref 70–99)

## 2020-11-16 MED ORDER — FLUDEOXYGLUCOSE F - 18 (FDG) INJECTION
9.0000 | Freq: Once | INTRAVENOUS | Status: AC | PRN
  Administered 2020-11-16: 8.8 via INTRAVENOUS

## 2020-11-20 DIAGNOSIS — N184 Chronic kidney disease, stage 4 (severe): Secondary | ICD-10-CM | POA: Diagnosis not present

## 2020-11-20 DIAGNOSIS — I129 Hypertensive chronic kidney disease with stage 1 through stage 4 chronic kidney disease, or unspecified chronic kidney disease: Secondary | ICD-10-CM | POA: Diagnosis not present

## 2020-11-20 DIAGNOSIS — R809 Proteinuria, unspecified: Secondary | ICD-10-CM | POA: Diagnosis not present

## 2020-11-20 DIAGNOSIS — D631 Anemia in chronic kidney disease: Secondary | ICD-10-CM | POA: Diagnosis not present

## 2020-11-20 DIAGNOSIS — E876 Hypokalemia: Secondary | ICD-10-CM | POA: Diagnosis not present

## 2020-11-20 DIAGNOSIS — N179 Acute kidney failure, unspecified: Secondary | ICD-10-CM | POA: Diagnosis not present

## 2020-11-20 DIAGNOSIS — I5032 Chronic diastolic (congestive) heart failure: Secondary | ICD-10-CM | POA: Diagnosis not present

## 2020-11-20 DIAGNOSIS — E1122 Type 2 diabetes mellitus with diabetic chronic kidney disease: Secondary | ICD-10-CM | POA: Diagnosis not present

## 2020-11-20 DIAGNOSIS — N2581 Secondary hyperparathyroidism of renal origin: Secondary | ICD-10-CM | POA: Diagnosis not present

## 2020-11-22 ENCOUNTER — Other Ambulatory Visit: Payer: Self-pay | Admitting: Adult Health

## 2020-11-24 ENCOUNTER — Encounter: Payer: Self-pay | Admitting: Emergency Medicine

## 2020-11-24 ENCOUNTER — Other Ambulatory Visit: Payer: Self-pay

## 2020-11-24 ENCOUNTER — Ambulatory Visit: Payer: Medicare PPO | Admitting: Emergency Medicine

## 2020-11-24 DIAGNOSIS — R911 Solitary pulmonary nodule: Secondary | ICD-10-CM

## 2020-11-24 NOTE — Progress Notes (Signed)
Subjective:    Patient ID: Austin [name], male    DOB: 27-Oct-1934, 86 y.o.   MRN: [id number]  HPI 86 year old former smoker (10 pack years) with a history of hypertension with diastolic heart failure, diabetes, CKD stage IV, hyperlipidemia, carotid and cerebrovascular disease (Plavix), prostate cancer.  He has been in and out of the hospital in January and February 2022 for exacerbations of diastolic CHF and volume overload.  As part of his evaluation he was found to have a right upper lobe 2 cm nodule.  He presents today for further evaluation of this. His S Cr 4.78.   Denies any cough, CP, dyspnea  CT chest 09/17/2020 reviewed by me, shows diffuse interstitial and septal groundglass consistent with pulmonary edema, trace bilateral effusions, background emphysema, and a 2.4 x 1.8 cm subpleural right upper lobe nodule with irregular margins, increased in size compared with a CT done 10/2016.   ROV 11/10/20 --follow-up visit for 86 year old former smoker (10 pack years) with history as above including diastolic CHF and chronic renal insufficiency stage IV.  He has a right upper lobe 1.4 x 1.2 2 cm nodule that is increasing in size on serial CT scans of the chest.  We had talked about getting a PET scan to help guide decision-making regarding bronchoscopy.  He understandably was concerned regarding possible risk of PET scan and his renal function.  Returns today to discuss further. He reports that every now and then he gets a "twinge" on the R, seems to be random. Notes that he was probably exposed to asbestos as a youth. Also that there is a hx of lung CA in the family. He sees Dr Terrence Dupont for cardiology, was cleared for procedure a couple weeks ago. He also sees Dr Royce Macadamia with Nephrology.   ROV 11/24/20 --[name] is 12, has chronic renal insufficiency and diastolic CHF.  We have been following a 2.3x1.8 cm right upper lobe pulmonary nodule that has increased in size on CT chest compared with 2018. He  underwent a PET scan on 11/16/2020 that showed no mediastinal hypermetabolism.  The ill-defined right upper lobe subpleural density was 1.4 x 1.2 cm, slightly larger than CT chest 2018 but smaller than most recent Ct 08/2020.  There is low-level metabolic activity (SUV 2.1).  Other stable subpleural nodules are present including stable 1.1 groundglass nodule in the left upper lobe   Review of Systems As per HPI     Objective:   Physical Exam Vitals:   11/24/20 0949  BP: (!) 156/78  Pulse: (!) 57  Temp: (!) 97.4 F (36.3 C)  TempSrc: Temporal  SpO2: 98%  Weight: 178 lb 3.2 oz (80.8 kg)  Height: 5\' 7"  (1.702 m)   Gen: Pleasant, well-nourished, in no distress,  normal affect  ENT: No lesions,  mouth clear,  oropharynx clear, no postnasal drip  Neck: No JVD, no stridor  Lungs: No use of accessory muscles, no crackles or wheezing on normal respiration, no wheeze on forced expiration  Cardiovascular: RRR, heart sounds normal, no murmur or gallops, no peripheral edema  Musculoskeletal: No deformities, no cyanosis or clubbing  Neuro: alert, awake, non focal  Skin: Warm, no lesions or rash     Assessment & Plan:  Pulmonary nodule 1 cm or greater in diameter Right upper lobe pulmonary nodule smaller to me on his PET scan than the CT chest that was done in January 2022, further it is not hypermetabolic.  I think we can follow without planning for  bronchoscopy at this time.  We will repeat a super D CT chest in September 2022 and then follow-up to review.  We will plan to perform a CT scan of the chest without contrast in September 2022 to compare with your priors. Follow [name] in September after your CT so that we can review the results together. Please call if you have any new respiratory symptoms so that we can troubleshoot.  Baltazar Apo, MD, PhD 11/24/2020, 10:14 AM [facility] [phone] or if no answer before 7:00PM call [phone] For any  issues after 7:00PM please call eLink [phone]

## 2020-11-24 NOTE — Patient Instructions (Signed)
We will plan to perform a CT scan of the chest without contrast in September 2022 to compare with your priors. Follow [name] in September after your CT so that we can review the results together. Please call if you have any new respiratory symptoms so that we can troubleshoot.

## 2020-11-24 NOTE — Addendum Note (Signed)
Addended by: Gavin Potters R on: 11/24/2020 10:34 AM   Modules accepted: Orders

## 2020-11-24 NOTE — Assessment & Plan Note (Signed)
Right upper lobe pulmonary nodule smaller to me on his PET scan than the CT chest that was done in January 2022, further it is not hypermetabolic.  I think we can follow without planning for bronchoscopy at this time.  We will repeat a super D CT chest in September 2022 and then follow-up to review.  We will plan to perform a CT scan of the chest without contrast in September 2022 to compare with your priors. Follow [name] in September after your CT so that we can review the results together. Please call if you have any new respiratory symptoms so that we can troubleshoot.

## 2020-11-25 ENCOUNTER — Other Ambulatory Visit: Payer: Self-pay | Admitting: Adult Health

## 2020-11-25 DIAGNOSIS — K5900 Constipation, unspecified: Secondary | ICD-10-CM

## 2020-12-02 DIAGNOSIS — I350 Nonrheumatic aortic (valve) stenosis: Secondary | ICD-10-CM | POA: Diagnosis not present

## 2020-12-02 DIAGNOSIS — I351 Nonrheumatic aortic (valve) insufficiency: Secondary | ICD-10-CM | POA: Diagnosis not present

## 2020-12-02 DIAGNOSIS — I739 Peripheral vascular disease, unspecified: Secondary | ICD-10-CM | POA: Diagnosis not present

## 2020-12-02 DIAGNOSIS — I5030 Unspecified diastolic (congestive) heart failure: Secondary | ICD-10-CM | POA: Diagnosis not present

## 2020-12-02 DIAGNOSIS — E785 Hyperlipidemia, unspecified: Secondary | ICD-10-CM | POA: Diagnosis not present

## 2020-12-02 DIAGNOSIS — I10 Essential (primary) hypertension: Secondary | ICD-10-CM | POA: Diagnosis not present

## 2020-12-02 DIAGNOSIS — E119 Type 2 diabetes mellitus without complications: Secondary | ICD-10-CM | POA: Diagnosis not present

## 2020-12-02 DIAGNOSIS — I679 Cerebrovascular disease, unspecified: Secondary | ICD-10-CM | POA: Diagnosis not present

## 2020-12-02 DIAGNOSIS — N189 Chronic kidney disease, unspecified: Secondary | ICD-10-CM | POA: Diagnosis not present

## 2020-12-07 ENCOUNTER — Telehealth: Payer: Self-pay | Admitting: Adult Health

## 2020-12-07 NOTE — Telephone Encounter (Signed)
Tried calling to schedule  Medicare Annual Wellness Visit (AWV) either virtually or in office.  Mail box full    Last AWV 11/25/14  please schedule at anytime with [facility]   This should be a 45 minute visit.

## 2020-12-10 ENCOUNTER — Other Ambulatory Visit: Payer: Self-pay | Admitting: Adult Health

## 2020-12-10 DIAGNOSIS — K5900 Constipation, unspecified: Secondary | ICD-10-CM

## 2020-12-16 ENCOUNTER — Other Ambulatory Visit: Payer: Self-pay | Admitting: Adult Health

## 2020-12-22 ENCOUNTER — Other Ambulatory Visit: Payer: Self-pay | Admitting: Adult Health

## 2020-12-23 ENCOUNTER — Ambulatory Visit: Payer: Medicare PPO | Admitting: Podiatry

## 2020-12-23 ENCOUNTER — Other Ambulatory Visit: Payer: Self-pay

## 2020-12-23 ENCOUNTER — Encounter: Payer: Self-pay | Admitting: Podiatry

## 2020-12-23 DIAGNOSIS — M79675 Pain in left toe(s): Secondary | ICD-10-CM | POA: Diagnosis not present

## 2020-12-23 DIAGNOSIS — B351 Tinea unguium: Secondary | ICD-10-CM | POA: Diagnosis not present

## 2020-12-23 DIAGNOSIS — E0821 Diabetes mellitus due to underlying condition with diabetic nephropathy: Secondary | ICD-10-CM | POA: Diagnosis not present

## 2020-12-23 DIAGNOSIS — M79674 Pain in right toe(s): Secondary | ICD-10-CM | POA: Diagnosis not present

## 2020-12-24 ENCOUNTER — Other Ambulatory Visit: Payer: Self-pay | Admitting: Adult Health

## 2020-12-24 DIAGNOSIS — E113291 Type 2 diabetes mellitus with mild nonproliferative diabetic retinopathy without macular edema, right eye: Secondary | ICD-10-CM | POA: Diagnosis not present

## 2020-12-24 DIAGNOSIS — H353231 Exudative age-related macular degeneration, bilateral, with active choroidal neovascularization: Secondary | ICD-10-CM | POA: Diagnosis not present

## 2020-12-24 DIAGNOSIS — H35033 Hypertensive retinopathy, bilateral: Secondary | ICD-10-CM | POA: Diagnosis not present

## 2020-12-24 DIAGNOSIS — E113212 Type 2 diabetes mellitus with mild nonproliferative diabetic retinopathy with macular edema, left eye: Secondary | ICD-10-CM | POA: Diagnosis not present

## 2020-12-25 ENCOUNTER — Encounter: Payer: Self-pay | Admitting: Podiatry

## 2020-12-25 NOTE — Progress Notes (Signed)
  Subjective:  Patient ID: Austin [name], male    DOB: Jul 08, 1935,  MRN: [id number]  No chief complaint on file.  86 y.o. male returns for the above complaint.  Patient presents with thickened elongated mycotic dystrophic toenails x10.  Pain there is pain associated with ambulation.  There is some curvature to the nail.  Patient is a diabetic with last A1c of 6.8.  Patient is currently taking Plavix.  Patient well-controlled diabetic.  He does not have any secondary complaints  Objective:   There were no vitals filed for this visit. Podiatric Exam: Vascular: dorsalis pedis and posterior tibial pulses are palpable bilateral. Capillary return is immediate. Temperature gradient is WNL. Skin turgor WNL  Sensorium: Slightly decreased Semmes Weinstein monofilament test.  Slightly decreased tactile sensation bilaterally. Nail Exam: Pt has thick disfigured discolored nails with subungual debris noted bilateral entire nail hallux through fifth toenails Ulcer Exam: There is no evidence of ulcer or pre-ulcerative changes or infection. Orthopedic Exam: Muscle tone and strength are WNL. No limitations in general ROM. No crepitus or effusions noted. HAV  B/L.  Hammer toes 2-5  B/L. Skin: No Porokeratosis. No infection or ulcers  Assessment & Plan:  Patient was evaluated and treated and all questions answered.  -Xerosis -I explained to the patient the etiology of xerosis and various treatment options were extensively discussed.  I explained to the patient the importance of maintaining moisturization of the skin with application of over-the-counter lotion such as Eucerin or Luciderm.  At this point given that he has failed over-the-counter lotion he will benefit from ammonium lactate.  I have asked him to apply twice a day.  Patient states understanding  Neuropathic pain -I explained the patient the etiology of neuropathic pain and various treatment options were discussed.  Ultimately I discussed with the  patient the importance of glucose management which will ultimately help with the pain.  For now patient does not want to take any medications and ultimately will discuss diet control to help with the pain.  Patient states understanding.  Onychomycosis with pain  -Nails palliatively debrided as below. -Educated on self-care  Procedure: Nail Debridement Rationale: pain  Type of Debridement: manual, sharp debridement. Instrumentation: Nail nipper, rotary burr. Number of Nails: 10  Procedures and Treatment: Consent by patient was obtained for treatment procedures. The patient understood the discussion of treatment and procedures well. All questions were answered thoroughly reviewed. Debridement of mycotic and hypertrophic toenails, 1 through 5 bilateral and clearing of subungual debris. No ulceration, no infection noted.  Return Visit-Office Procedure: Patient instructed to return to the office for a follow up visit 3 months for continued evaluation and treatment.  Boneta Lucks, DPM    Return in about 3 months (around 03/25/2021) for MAyer.

## 2020-12-28 ENCOUNTER — Ambulatory Visit: Payer: Medicare PPO

## 2020-12-30 DIAGNOSIS — N184 Chronic kidney disease, stage 4 (severe): Secondary | ICD-10-CM | POA: Diagnosis not present

## 2021-01-04 DIAGNOSIS — N184 Chronic kidney disease, stage 4 (severe): Secondary | ICD-10-CM | POA: Diagnosis not present

## 2021-01-04 DIAGNOSIS — E876 Hypokalemia: Secondary | ICD-10-CM | POA: Diagnosis not present

## 2021-01-04 DIAGNOSIS — E1122 Type 2 diabetes mellitus with diabetic chronic kidney disease: Secondary | ICD-10-CM | POA: Diagnosis not present

## 2021-01-04 DIAGNOSIS — R809 Proteinuria, unspecified: Secondary | ICD-10-CM | POA: Diagnosis not present

## 2021-01-04 DIAGNOSIS — I5032 Chronic diastolic (congestive) heart failure: Secondary | ICD-10-CM | POA: Diagnosis not present

## 2021-01-04 DIAGNOSIS — N2581 Secondary hyperparathyroidism of renal origin: Secondary | ICD-10-CM | POA: Diagnosis not present

## 2021-01-04 DIAGNOSIS — I129 Hypertensive chronic kidney disease with stage 1 through stage 4 chronic kidney disease, or unspecified chronic kidney disease: Secondary | ICD-10-CM | POA: Diagnosis not present

## 2021-01-04 DIAGNOSIS — D631 Anemia in chronic kidney disease: Secondary | ICD-10-CM | POA: Diagnosis not present

## 2021-01-20 DIAGNOSIS — H25043 Posterior subcapsular polar age-related cataract, bilateral: Secondary | ICD-10-CM | POA: Diagnosis not present

## 2021-01-20 DIAGNOSIS — H2513 Age-related nuclear cataract, bilateral: Secondary | ICD-10-CM | POA: Diagnosis not present

## 2021-01-20 DIAGNOSIS — E113293 Type 2 diabetes mellitus with mild nonproliferative diabetic retinopathy without macular edema, bilateral: Secondary | ICD-10-CM | POA: Diagnosis not present

## 2021-01-20 DIAGNOSIS — H524 Presbyopia: Secondary | ICD-10-CM | POA: Diagnosis not present

## 2021-01-20 LAB — HM DIABETES EYE EXAM

## 2021-01-21 ENCOUNTER — Other Ambulatory Visit: Payer: Self-pay | Admitting: Adult Health

## 2021-01-25 ENCOUNTER — Encounter: Payer: Self-pay | Admitting: Adult Health

## 2021-01-28 ENCOUNTER — Other Ambulatory Visit: Payer: Self-pay

## 2021-01-28 ENCOUNTER — Ambulatory Visit (INDEPENDENT_AMBULATORY_CARE_PROVIDER_SITE_OTHER): Payer: Medicare PPO

## 2021-01-28 VITALS — BP 138/63 | HR 63 | Temp 98.0°F | Ht 67.0 in | Wt 172.0 lb

## 2021-01-28 DIAGNOSIS — Z0000 Encounter for general adult medical examination without abnormal findings: Secondary | ICD-10-CM

## 2021-01-28 NOTE — Progress Notes (Signed)
Subjective:   Austin [name] is a 86 y.o. male who presents for an Initial Medicare Annual Wellness Visit.  Review of Systems    N/a       Objective:    There were no vitals filed for this visit. There is no height or weight on file to calculate BMI.  Advanced Directives 10/09/2020 09/19/2020 02/24/2020 11/28/2019 11/28/2019 08/05/2019 08/04/2019  Does Patient Have a Medical Advance Directive? _0  - No  Type of Advance Directive - - - - - - -  Does patient want to make changes to medical advance directive? - - - - - - -  Copy of [place] in Chart? - - - - - - -  Would patient like information on creating a medical advance directive? No - Patient declined No - Patient declined No - Patient declined - - No - Patient declined -  Pre-existing out of facility DNR order (yellow form or pink MOST form) - - - - - - -    Current Medications (verified) Outpatient Encounter Medications as of 01/28/2021  Medication Sig   ACCU-CHEK GUIDE test strip USED TO CHECK BLOOD GLUCOSE TWICE A DAY OR AS NEEDED   Accu-Chek Softclix Lancets lancets USED TO CHECK BLOOD GLUCOSE TWICE A DAY OR AS NEEDED   acetaminophen (TYLENOL) 325 MG tablet Take 325 mg by mouth 4 (four) times daily.   amLODipine (NORVASC) 10 MG tablet TAKE 1 TABLET BY MOUTH EVERY DAY (Patient taking differently: Take 10 mg by mouth daily.)   atorvastatin (LIPITOR) 20 MG tablet TAKE 1 TABLET BY MOUTH EVERY DAY   Blood Glucose Monitoring Suppl (ACCU-CHEK AVIVA PLUS) w/Device KIT Used to check blood glucose 2 times a day or PRN   cholecalciferol (VITAMIN D3) 25 MCG (1000 UNIT) tablet Take 1,000 Units by mouth once a week.   cloNIDine (CATAPRES) 0.3 MG tablet Take 1 tablet (0.3 mg total) by mouth 3 (three) times daily.   clopidogrel (PLAVIX) 75 MG tablet TAKE 1 TABLET BY MOUTH EVERY DAY (Patient taking differently: Take 75 mg by mouth daily with supper.)   doxazosin (CARDURA) 2 MG tablet Take 2 mg by mouth at bedtime.     esomeprazole (NEXIUM) 40 MG capsule TAKE 1 CAPSULE BY MOUTH EVERY DAY (Patient taking differently: Take 40 mg by mouth daily.)   glipiZIDE (GLUCOTROL XL) 5 MG 24 hr tablet TAKE 1 TABLET BY MOUTH EVERY DAY   hydrocortisone (ANUSOL-HC) 2.5 % rectal cream PLACE 1 APPLICATION RECTALLY 2 TIMES A DAY.   isosorbide-hydrALAZINE (BIDIL) 20-37.5 MG tablet Take 1 tablet by mouth 3 (three) times daily.   KLOR-CON M10 10 MEQ tablet Take 2 tablets (20 mEq total) by mouth 2 (two) times daily. (Patient taking differently: Take 10 mEq by mouth 2 (two) times daily.)   lactulose (CHRONULAC) 10 GM/15ML solution TAKE 15 ML BY MOUTH 2 TIMES DAILY AS NEEDED FOR MILD CONSTIPATION.   metoprolol succinate (TOPROL-XL) 100 MG 24 hr tablet Take 1 tablet (100 mg total) by mouth daily. Take with or immediately following a meal.   POLY-IRON 150 150 MG capsule Take 150 mg by mouth daily.   polyethylene glycol (MIRALAX / GLYCOLAX) 17 g packet Take 17 g by mouth daily as needed for mild constipation.   Propylene Glycol (SYSTANE BALANCE OP) Place 1 drop into both eyes daily as needed (dry eyes).   saccharomyces boulardii (FLORASTOR) 250 MG capsule Take 1 capsule (250 mg total) by mouth 2 (two) times  daily.   torsemide (DEMADEX) 20 MG tablet Take 1 tablet (20 mg total) by mouth 2 (two) times daily.   triamcinolone cream (KENALOG) 0.1 % Apply 1 application topically daily.   Facility-Administered Encounter Medications as of 01/28/2021  Medication   ammonium lactate (LAC-HYDRIN) 12 % lotion    Allergies (verified) Aspirin   History: Past Medical History:  Diagnosis Date   ANEMIA DUE TO CHRONIC BLOOD LOSS 03/13/2007   CAROTID ARTERY STENOSIS 05/10/2010   CHF (congestive heart failure) ([place])    DIABETES MELLITUS, TYPE II 09/19/2007   DISEASE, CEREBROVASCULAR NEC 03/05/2007   GERD 03/13/2007   HYPERLIPIDEMIA 03/05/2007   HYPERTENSION 03/05/2007   HYPOKALEMIA 11/09/2009   KNEE PAIN, RIGHT 11/09/2009   PROSTATE CANCER, HX OF 03/05/2007    RENAL DISEASE, CHRONIC 02/03/2009   Past Surgical History:  Procedure Laterality Date   CAROTID ARTERY ANGIOPLASTY Right Oct. 10, 2001   ESOPHAGOGASTRODUODENOSCOPY (EGD) WITH PROPOFOL N/A 11/04/2016   Procedure: ESOPHAGOGASTRODUODENOSCOPY (EGD) WITH PROPOFOL;  Surgeon: Otis Brace, MD;  Location: [facility] [place];  Service: Gastroenterology;  Laterality: N/A;   LAPAROSCOPIC APPENDECTOMY  02/20/2012   Procedure: APPENDECTOMY LAPAROSCOPIC;  Surgeon: Stark Klein, MD;  Location: [facility];  Service: General;  Laterality: N/A;   PROSTATE SURGERY     prostatectomy   Family History  Problem Relation Age of Onset   Hypertension Mother    Cancer Father        Mesothelioma    Stomach cancer Brother    Cancer Brother    Cancer - Cervical Brother    Cancer Brother    Diabetes Brother    Esophageal cancer Neg Hx    Colon cancer Neg Hx    Pancreatic cancer Neg Hx    Social History   Socioeconomic History   Marital status: Married    Spouse name: Not on file   Number of children: Not on file   Years of education: Not on file   Highest education level: Not on file  Occupational History   Not on file  Tobacco Use   Smoking status: Former    Packs/day: 1.00    Years: 10.00    Pack years: 10.00    Types: Cigarettes    Quit date: 08/22/1974    Years since quitting: 46.4   Smokeless tobacco: Never  Vaping Use   Vaping Use: Never used  Substance and Sexual Activity   Alcohol use: No    Alcohol/week: 0.0 standard drinks   Drug use: No   Sexual activity: Not on file  Other Topics Concern   Not on file  Social History Narrative   Retired - Environmental consultant    Married 42 years       He enjoys traveling    Investment banker, operational of Radio broadcast assistant Strain: Not on Art therapist Insecurity: Not on file  Transportation Needs: Not on file  Physical Activity: Not on file  Stress: Not on file  Social Connections: Not on file    Tobacco Counseling Counseling given: Not  Answered   Clinical Intake:                 Diabetic?yes Nutrition Risk Assessment:  Has the patient had any N/V/D within the last 2 months?  No  Does the patient have any non-healing wounds?  No  Has the patient had any unintentional weight loss or weight gain?  No   Diabetes:  Is the patient diabetic?  Yes  If diabetic, was a  CBG obtained today?  Yes  Did the patient bring in their glucometer from home?  No  How often do you monitor your CBG's? Daily .   Financial Strains and Diabetes Management:  Are you having any financial strains with the device, your supplies or your medication? No .  Does the patient want to be seen by Chronic Care Management for management of their diabetes?  No  Would the patient like to be referred to a Nutritionist or for Diabetic Management?  No   Diabetic Exams:  Diabetic Eye Exam: Completed 01/2021 Diabetic Foot Exam: Overdue, Pt has been advised about the importance in completing this exam. Pt is scheduled for diabetic foot exam on next office visit .          Activities of Daily Living In your present state of health, do you have any difficulty performing the following activities: 10/09/2020 09/19/2020  Hearing? N -  Vision? N -  Difficulty concentrating or making decisions? N -  Walking or climbing stairs? N -  Dressing or bathing? N -  Doing errands, shopping? Tempie Donning  Some recent data might be hidden    Patient Care Team: Dorothyann Peng, NP as PCP - General (Family Medicine) Charolette Forward, MD as Consulting Physician (Cardiology) Milus Height, MD as Consulting Physician (Ophthalmology) Lowella Bandy, MD (Inactive) as Consulting Physician (Urology) Milus Banister, MD as Attending Physician (Gastroenterology) Claudia Desanctis, MD as Consulting Physician (Nephrology)  Indicate any recent Medical Services you may have received from other than [facility] providers in the past year (date may be approximate).     Assessment:    This is a routine wellness examination for [place].  Hearing/Vision screen No results found.  Dietary issues and exercise activities discussed:     Goals Addressed   None    Depression Screen PHQ 2/9 Scores 05/01/2019 01/09/2017 11/09/2016 02/10/2016 11/25/2014 10/10/2013 07/08/2013  PHQ - 2 Score 0 0 0 0 0 0 0    Fall Risk Fall Risk  05/01/2019 01/09/2017 11/09/2016 02/10/2016 11/25/2014  Falls in the past year? 0 No No No No    FALL RISK PREVENTION PERTAINING TO THE HOME:  Any stairs in or around the home? Yes  If so, are there any without handrails? Yes  Home free of loose throw rugs in walkways, pet beds, electrical cords, etc? Yes  Adequate lighting in your home to reduce risk of falls? Yes   ASSISTIVE DEVICES UTILIZED TO PREVENT FALLS:  Life alert? No  Use of a cane, walker or w/c? No  Grab bars in the bathroom? No  Shower chair or bench in shower? No  Elevated toilet seat or a handicapped toilet? Yes   TIMED UP AND GO:  Was the test performed? Yes .  Length of time to ambulate 10 feet: 8 sec.   Gait steady and fast without use of assistive device  Cognitive Function:        Immunizations Immunization History  Administered Date(s) Administered   Fluad Quad(high Dose 65+) 05/01/2019   Influenza Split 05/27/2011, 05/30/2012   Influenza Whole 07/10/2009, 05/10/2010   Influenza, High Dose Seasonal PF 07/08/2013, 07/14/2015, 06/20/2016, 05/24/2017, 06/12/2018   Influenza,inj,Quad PF,6+ Mos 06/25/2014   Influenza-Unspecified 10/06/2020   PFIZER(Purple Top)SARS-COV-2 Vaccination 09/11/2019, 10/02/2019, 06/15/2020   Pneumococcal Conjugate-13 11/25/2014   Pneumococcal Polysaccharide-23 03/07/2013   Tetanus 03/07/2013   Zoster Recombinat (Shingrix) 04/03/2020, 07/27/2020    TDAP status: Up to date  Flu Vaccine status: Up to date  Pneumococcal vaccine  status: Up to date  Covid-19 vaccine status: Completed vaccines  Qualifies for Shingles Vaccine? Yes   Zostavax  completed No   Shingrix Completed?: Yes  Screening Tests Health Maintenance  Topic Date Due   Pneumococcal Vaccine 23-86 Years old (1 - PCV) Never done   FOOT EXAM  04/30/2020   COVID-19 Vaccine (4 - Booster for Pfizer series) 09/15/2020   HEMOGLOBIN A1C  03/16/2021   INFLUENZA VACCINE  03/22/2021   OPHTHALMOLOGY EXAM  01/20/2022   TETANUS/TDAP  03/08/2023   PNA vac Low Risk Adult  Completed   Zoster Vaccines- Shingrix  Completed   HPV VACCINES  Aged Out    Health Maintenance  Health Maintenance Due  Topic Date Due   Pneumococcal Vaccine 47-37 Years old (1 - PCV) Never done   FOOT EXAM  04/30/2020   COVID-19 Vaccine (4 - Booster for Pfizer series) 09/15/2020    Colorectal cancer screening: No longer required.   Lung Cancer Screening: (Low Dose CT Chest recommended if Age 71-80 years, 30 pack-year currently smoking OR have quit w/in 15years.) does not qualify.   Lung Cancer Screening Referral: n/a  Additional Screening:  Hepatitis C Screening: does not qualify  Vision Screening: Recommended annual ophthalmology exams for early detection of glaucoma and other disorders of the eye. Is the patient up to date with their annual eye exam?  Yes  Who is the provider or what is the name of the office in which the patient attends annual eye exams? Dr.Tanner If pt is not established with a provider, would they like to be referred to a provider to establish care? No .   Dental Screening: Recommended annual dental exams for proper oral hygiene  Community Resource Referral / Chronic Care Management: CRR required this visit?  No   CCM required this visit?  No      Plan:     I have personally reviewed and noted the following in the patient's chart:   Medical and social history Use of alcohol, tobacco or illicit drugs  Current medications and supplements including opioid prescriptions. Patient is not currently taking opioid prescriptions. Functional ability and  status Nutritional status Physical activity Advanced directives List of other physicians Hospitalizations, surgeries, and ER visits in previous 12 months Vitals Screenings to include cognitive, depression, and falls Referrals and appointments  In addition, I have reviewed and discussed with patient certain preventive protocols, quality metrics, and best practice recommendations. A written personalized care plan for preventive services as well as general preventive health recommendations were provided to patient.     Randel Pigg, LPN   08/23/7869   Nurse Notes: none

## 2021-01-28 NOTE — Patient Instructions (Signed)
Austin [name] , Thank you for taking time to come for your Medicare Wellness Visit. I appreciate your ongoing commitment to your health goals. Please review the following plan we discussed and let me know if I can assist you in the future.   Screening recommendations/referrals: Colonoscopy: no longer required  Recommended yearly ophthalmology/optometry visit for glaucoma screening and checkup Recommended yearly dental visit for hygiene and checkup  Vaccinations: Influenza vaccine:  due in the fall 2022  Pneumococcal vaccine: completed series  Tdap vaccine: current due 2024  Shingles vaccine:  completed se   Advanced directives: will provide copies   Conditions/risks identified: none   Next appointment: none   Preventive Care 50 Years and Older, Male Preventive care refers to lifestyle choices and visits with your health care provider that can promote health and wellness. What does preventive care include? A yearly physical exam. This is also called an annual well check. Dental exams once or twice a year. Routine eye exams. Ask your health care provider how often you should have your eyes checked. Personal lifestyle choices, including: Daily care of your teeth and gums. Regular physical activity. Eating a healthy diet. Avoiding tobacco and drug use. Limiting alcohol use. Practicing safe sex. Taking low doses of aspirin every day. Taking vitamin and mineral supplements as recommended by your health care provider. What happens during an annual well check? The services and screenings done by your health care provider during your annual well check will depend on your age, overall health, lifestyle risk factors, and family history of disease. Counseling  Your health care provider may ask you questions about your: Alcohol use. Tobacco use. Drug use. Emotional well-being. Home and relationship well-being. Sexual activity. Eating habits. History of falls. Memory and ability to  understand (cognition). Work and work Statistician. Screening  You may have the following tests or measurements: Height, weight, and BMI. Blood pressure. Lipid and cholesterol levels. These may be checked every 5 years, or more frequently if you are over 58 years old. Skin check. Lung cancer screening. You may have this screening every year starting at age 32 if you have a 30-pack-year history of smoking and currently smoke or have quit within the past 15 years. Fecal occult blood test (FOBT) of the stool. You may have this test every year starting at age 23. Flexible sigmoidoscopy or colonoscopy. You may have a sigmoidoscopy every 5 years or a colonoscopy every 10 years starting at age 21. Prostate cancer screening. Recommendations will vary depending on your family history and other risks. Hepatitis C blood test. Hepatitis B blood test. Sexually transmitted disease (STD) testing. Diabetes screening. This is done by checking your blood sugar (glucose) after you have not eaten for a while (fasting). You may have this done every 1-3 years. Abdominal aortic aneurysm (AAA) screening. You may need this if you are a current or former smoker. Osteoporosis. You may be screened starting at age 73 if you are at high risk. Talk with your health care provider about your test results, treatment options, and if necessary, the need for more tests. Vaccines  Your health care provider may recommend certain vaccines, such as: Influenza vaccine. This is recommended every year. Tetanus, diphtheria, and acellular pertussis (Tdap, Td) vaccine. You may need a Td booster every 10 years. Zoster vaccine. You may need this after age 82. Pneumococcal 13-valent conjugate (PCV13) vaccine. One dose is recommended after age 63. Pneumococcal polysaccharide (PPSV23) vaccine. One dose is recommended after age 1. Talk to your health  care provider about which screenings and vaccines you need and how often you need them. This  information is not intended to replace advice given to you by your health care provider. Make sure you discuss any questions you have with your health care provider. Document Released: 09/04/2015 Document Revised: 04/27/2016 Document Reviewed: 06/09/2015 Elsevier Interactive Patient Education  2017 [place] Prevention in the Home Falls can cause injuries. They can happen to people of all ages. There are many things you can do to make your home safe and to help prevent falls. What can I do on the outside of my home? Regularly fix the edges of walkways and driveways and fix any cracks. Remove anything that might make you trip as you walk through a door, such as a raised step or threshold. Trim any bushes or trees on the path to your home. Use bright outdoor lighting. Clear any walking paths of anything that might make someone trip, such as rocks or tools. Regularly check to see if handrails are loose or broken. Make sure that both sides of any steps have handrails. Any raised decks and porches should have guardrails on the edges. Have any leaves, snow, or ice cleared regularly. Use sand or salt on walking paths during winter. Clean up any spills in your garage right away. This includes oil or grease spills. What can I do in the bathroom? Use night lights. Install grab bars by the toilet and in the tub and shower. Do not use towel bars as grab bars. Use non-skid mats or decals in the tub or shower. If you need to sit down in the shower, use a plastic, non-slip stool. Keep the floor dry. Clean up any water that spills on the floor as soon as it happens. Remove soap buildup in the tub or shower regularly. Attach bath mats securely with double-sided non-slip rug tape. Do not have throw rugs and other things on the floor that can make you trip. What can I do in the bedroom? Use night lights. Make sure that you have a light by your bed that is easy to reach. Do not use any sheets or  blankets that are too big for your bed. They should not hang down onto the floor. Have a firm chair that has side arms. You can use this for support while you get dressed. Do not have throw rugs and other things on the floor that can make you trip. What can I do in the kitchen? Clean up any spills right away. Avoid walking on wet floors. Keep items that you use a lot in easy-to-reach places. If you need to reach something above you, use a strong step stool that has a grab bar. Keep electrical cords out of the way. Do not use floor polish or wax that makes floors slippery. If you must use wax, use non-skid floor wax. Do not have throw rugs and other things on the floor that can make you trip. What can I do with my stairs? Do not leave any items on the stairs. Make sure that there are handrails on both sides of the stairs and use them. Fix handrails that are broken or loose. Make sure that handrails are as long as the stairways. Check any carpeting to make sure that it is firmly attached to the stairs. Fix any carpet that is loose or worn. Avoid having throw rugs at the top or bottom of the stairs. If you do have throw rugs, attach them to  the floor with carpet tape. Make sure that you have a light switch at the top of the stairs and the bottom of the stairs. If you do not have them, ask someone to add them for you. What else can I do to help prevent falls? Wear shoes that: Do not have high heels. Have rubber bottoms. Are comfortable and fit you well. Are closed at the toe. Do not wear sandals. If you use a stepladder: Make sure that it is fully opened. Do not climb a closed stepladder. Make sure that both sides of the stepladder are locked into place. Ask someone to hold it for you, if possible. Clearly mark and make sure that you can see: Any grab bars or handrails. First and last steps. Where the edge of each step is. Use tools that help you move around (mobility aids) if they are  needed. These include: Canes. Walkers. Scooters. Crutches. Turn on the lights when you go into a dark area. Replace any light bulbs as soon as they burn out. Set up your furniture so you have a clear path. Avoid moving your furniture around. If any of your floors are uneven, fix them. If there are any pets around you, be aware of where they are. Review your medicines with your doctor. Some medicines can make you feel dizzy. This can increase your chance of falling. Ask your doctor what other things that you can do to help prevent falls. This information is not intended to replace advice given to you by your health care provider. Make sure you discuss any questions you have with your health care provider. Document Released: 06/04/2009 Document Revised: 01/14/2016 Document Reviewed: 09/12/2014 Elsevier Interactive Patient Education  2017 [employer].

## 2021-02-12 ENCOUNTER — Other Ambulatory Visit: Payer: Self-pay | Admitting: Adult Health

## 2021-03-03 DIAGNOSIS — N189 Chronic kidney disease, unspecified: Secondary | ICD-10-CM | POA: Diagnosis not present

## 2021-03-03 DIAGNOSIS — E119 Type 2 diabetes mellitus without complications: Secondary | ICD-10-CM | POA: Diagnosis not present

## 2021-03-03 DIAGNOSIS — I350 Nonrheumatic aortic (valve) stenosis: Secondary | ICD-10-CM | POA: Diagnosis not present

## 2021-03-03 DIAGNOSIS — I739 Peripheral vascular disease, unspecified: Secondary | ICD-10-CM | POA: Diagnosis not present

## 2021-03-03 DIAGNOSIS — I10 Essential (primary) hypertension: Secondary | ICD-10-CM | POA: Diagnosis not present

## 2021-03-03 DIAGNOSIS — I351 Nonrheumatic aortic (valve) insufficiency: Secondary | ICD-10-CM | POA: Diagnosis not present

## 2021-03-03 DIAGNOSIS — I5030 Unspecified diastolic (congestive) heart failure: Secondary | ICD-10-CM | POA: Diagnosis not present

## 2021-03-03 DIAGNOSIS — J984 Other disorders of lung: Secondary | ICD-10-CM | POA: Diagnosis not present

## 2021-03-03 DIAGNOSIS — I679 Cerebrovascular disease, unspecified: Secondary | ICD-10-CM | POA: Diagnosis not present

## 2021-03-18 ENCOUNTER — Other Ambulatory Visit: Payer: Self-pay | Admitting: Adult Health

## 2021-03-18 DIAGNOSIS — K5900 Constipation, unspecified: Secondary | ICD-10-CM

## 2021-03-18 NOTE — Telephone Encounter (Signed)
Ok for refill? 

## 2021-03-22 ENCOUNTER — Telehealth: Payer: Self-pay | Admitting: Emergency Medicine

## 2021-03-22 NOTE — Telephone Encounter (Signed)
Called and spoke with patient. He stated that he developed a cough about a week ago. Described the cough as non-productive but he does feel like he has something to cough up but he is unable to cough it up. He also noticed an increase in wheezing especially at night when laying down. He denied any fevers, body aches or chest pain. He also denied being around who has been sick recently.   He has not taken any OTC medications yet.   Pharmacy [facility] on [address].   RB, can you please advise? Thanks!

## 2021-03-22 NOTE — Telephone Encounter (Signed)
Spoke with pt and reviewed Dr. Agustina Caroli recommendations. Pt scheduled with NP on 04/06/21. Nothing further needed at this time.

## 2021-03-22 NOTE — Telephone Encounter (Signed)
He will need to be evaluated - has a hx of lung mass and now new cough. Have him seen by RB or APP In meantime, ask him to try OTC delsym to see if this helps w cough suppression.

## 2021-03-26 ENCOUNTER — Ambulatory Visit: Payer: Medicare PPO | Admitting: Podiatry

## 2021-03-26 ENCOUNTER — Other Ambulatory Visit: Payer: Self-pay

## 2021-03-26 DIAGNOSIS — E0821 Diabetes mellitus due to underlying condition with diabetic nephropathy: Secondary | ICD-10-CM

## 2021-03-26 DIAGNOSIS — B351 Tinea unguium: Secondary | ICD-10-CM

## 2021-03-26 DIAGNOSIS — Q828 Other specified congenital malformations of skin: Secondary | ICD-10-CM

## 2021-03-26 DIAGNOSIS — M792 Neuralgia and neuritis, unspecified: Secondary | ICD-10-CM

## 2021-03-26 DIAGNOSIS — M79675 Pain in left toe(s): Secondary | ICD-10-CM

## 2021-03-26 DIAGNOSIS — M79674 Pain in right toe(s): Secondary | ICD-10-CM

## 2021-03-26 NOTE — Progress Notes (Signed)
This patient returns to my office for at risk foot care.  This patient requires this care by a professional since this patient will be at risk due to having diabetic neuropathy and CKD   This patient is unable to cut nails himself since the patient cannot reach his nails.These nails are painful walking and wearing shoes.  This patient presents for at risk foot care today.  General Appearance  Alert, conversant and in no acute stress.  Vascular  Dorsalis pedis and posterior tibial  pulses are palpable  bilaterally.  Capillary return is within normal limits  bilaterally. Temperature is within normal limits  bilaterally.  Neurologic  Senn-Weinstein monofilament wire test diminished bilaterally. Muscle power within normal limits bilaterally.  Nails Thick disfigured discolored nails with subungual debris  from hallux to fifth toes bilaterally. No evidence of bacterial infection or drainage bilaterally.  Orthopedic  No limitations of motion  feet .  No crepitus or effusions noted.  No bony pathology or digital deformities noted.  HAV  B/L  Hammer toes  B/L.  Skin  normotropic skin with no porokeratosis noted bilaterally.  No signs of infections or ulcers noted.     Onychomycosis  Pain in right toes  Pain in left toes  Consent was obtained for treatment procedures.   Mechanical debridement of nails 1-5  bilaterally performed with a nail nipper.  Filed with dremel without incident. Told him to use vaseline.   Return office visit    3 months                 Told patient to return for periodic foot care and evaluation due to potential at risk complications.   Gardiner Barefoot DPM

## 2021-03-28 IMAGING — DX DG CHEST 2V
2 series · 2 of 2 positions shown · non-contrast
Comparison: 03/27/2019

CLINICAL DATA: Short of breath.

EXAM:
CHEST - 2 VIEW

[chest pa]
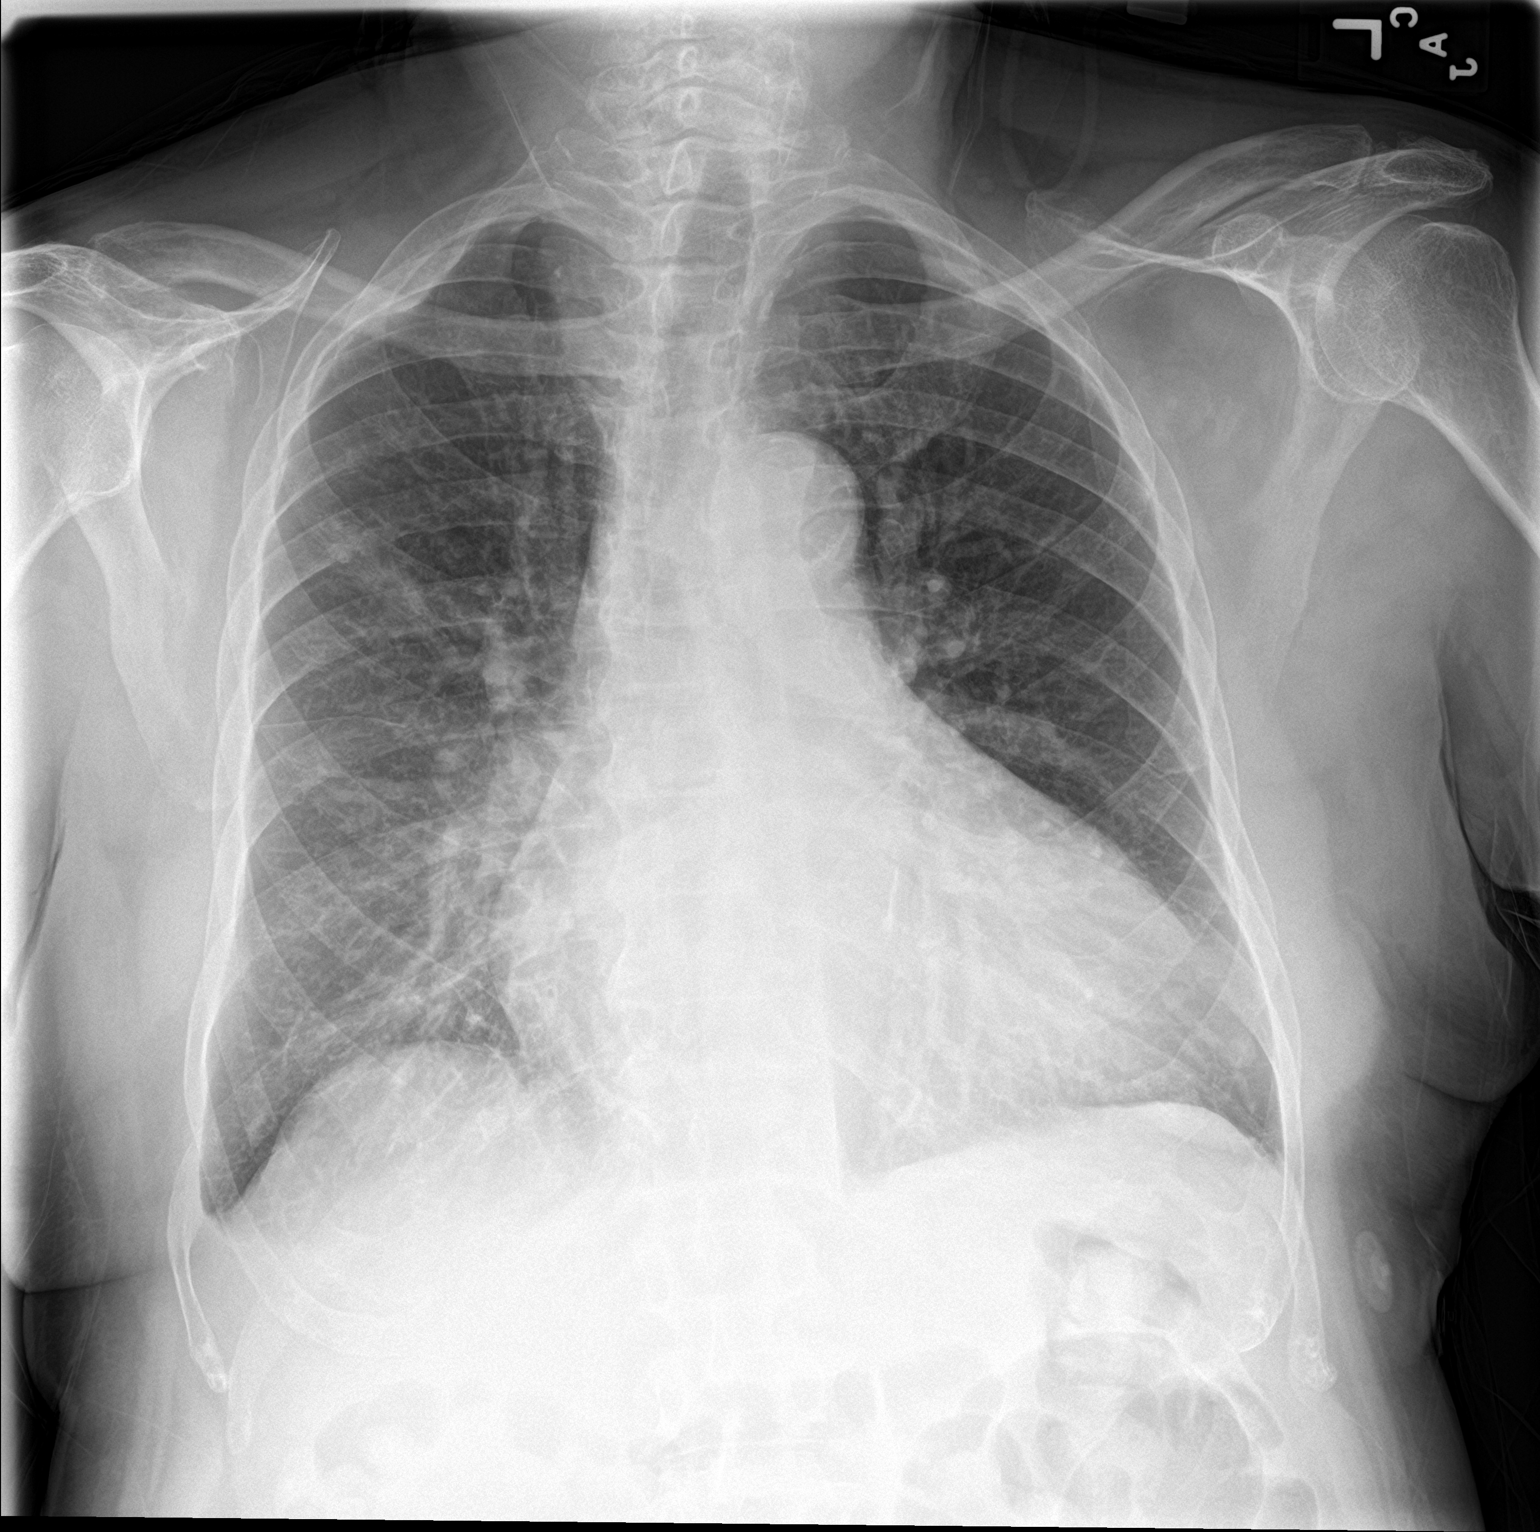

[chest lat]
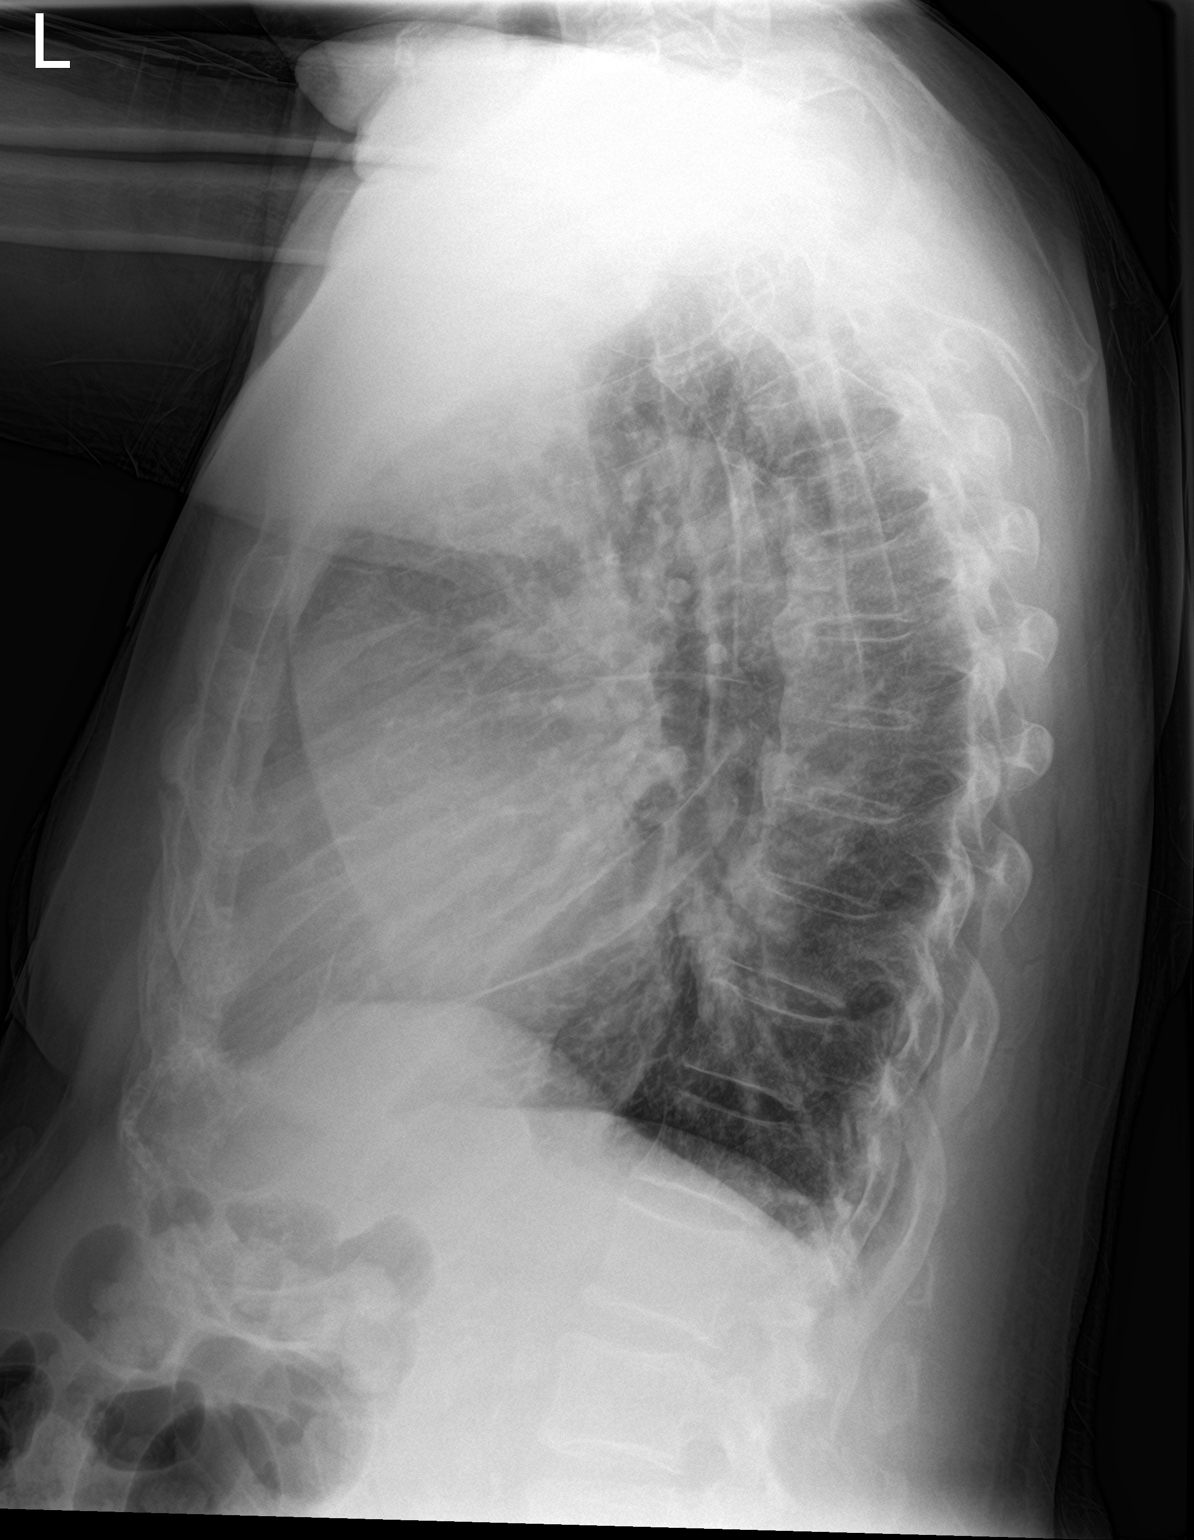

[2 of 2 positions shown; findings below may reference images not displayed]

FINDINGS: Cardiac silhouette is enlarged. There is fine airspace disease in
the RIGHT lower lobe and RIGHT upper lobe which is very similar to
radiograph 03/27/2019. LEFT lung clear. No pneumothorax. No pleural
fluid.
IMPRESSION: Fine asymmetric airspace disease in the RIGHT upper lobe and RIGHT
lower lobe not changed from 03/27/2019. Findings suggest asymmetric
edema over infection.

## 2021-03-29 ENCOUNTER — Other Ambulatory Visit: Payer: Self-pay | Admitting: Family Medicine

## 2021-03-30 NOTE — Telephone Encounter (Signed)
Please advise. This looks  like it was prescribed for acute reasons. Should the patient continue this medication?

## 2021-04-01 DIAGNOSIS — N184 Chronic kidney disease, stage 4 (severe): Secondary | ICD-10-CM | POA: Diagnosis not present

## 2021-04-01 DIAGNOSIS — N189 Chronic kidney disease, unspecified: Secondary | ICD-10-CM | POA: Diagnosis not present

## 2021-04-06 ENCOUNTER — Ambulatory Visit (INDEPENDENT_AMBULATORY_CARE_PROVIDER_SITE_OTHER): Payer: Medicare PPO

## 2021-04-06 ENCOUNTER — Encounter: Payer: Self-pay | Admitting: Primary Care

## 2021-04-06 ENCOUNTER — Other Ambulatory Visit: Payer: Self-pay

## 2021-04-06 ENCOUNTER — Ambulatory Visit: Payer: Medicare PPO | Admitting: Primary Care

## 2021-04-06 VITALS — BP 148/64 | HR 56 | Temp 98.1°F | Ht 67.0 in | Wt 172.8 lb

## 2021-04-06 DIAGNOSIS — R058 Other specified cough: Secondary | ICD-10-CM | POA: Diagnosis not present

## 2021-04-06 DIAGNOSIS — R911 Solitary pulmonary nodule: Secondary | ICD-10-CM | POA: Diagnosis not present

## 2021-04-06 MED ORDER — PREDNISONE 10 MG PO TABS: ORAL_TABLET | ORAL | 0 refills | Status: DC

## 2021-04-06 NOTE — Assessment & Plan Note (Addendum)
-    Patient developed dry cough x 1 week ago with associated wheezing. No shortness of breath. He is afebrile and lungs were clear on exam. Cough is consistent with upper airway cough syndrome. He is compliant with GERD medication. Recommend starting flonase nasal spray and sending in short prednisone taper. No indication for antibiotics at this time. Checking CXR today to rule out infiltrate, pleural effusion or enlarging lung mass. FU if cough is not better.

## 2021-04-06 NOTE — Patient Instructions (Addendum)
Common causes of cough are post nasal drip, asthma or GERD. We will check CXR to make sure there is no evidence of pneumonia, bronchitis, pleural effusion or enlarging mass/pulmonary nodule   Recommendations: - Start over the counter Flonase nasal spray 1 puff per nostril once daily - Prednisone taper as directed  - Monitor for fever, increased shortness of breath, chest pain/discomfort, hemoptysis (coughing up blood) or weight loss   Orders: - CXR today  Follow-up: - If cough does not get better/ and after CT chest in September

## 2021-04-06 NOTE — Progress Notes (Signed)
'@Patient'  ID: Austin [name], male    DOB: [birth year], 86 y.o.   MRN: [id number]  Chief Complaint  Patient presents with   Follow-up    Patient reports dry cough about the same since last visit.     Referring provider: Dorothyann Peng, NP  HPI: 86 year old male, former smoker quit 1976 (10-pack-year history).  Past medical history significant for mass of right lung, ingestive heart failure, coronary artery stenosis, hypertension, GERD, diabetes, chronic kidney disease stage IV, history of prostate cancer.  Patient of [name], last seen in office on 11/24/2020 pulmonary nodule right upper lobe.   04/06/2021 Patient presents today for 58-monthfollow-up.  During his last visit with Dr. BLamonte Sakaiwas felt that his right upper lobe pulmonary nodule was smaller on PET scan versus CT chest that was done in January 2022 and was not hypermetabolic.  It is not recommended that he proceed with bronchoscopy at that time.  Plan was to repeat super D CT chest in September 2022 and follow back up with Dr. BLamonte Sakaiafterwards.  In the interim patient called our office on August 1 with reports of nonproductive cough x1 week and increased wheezing when laying down at night.  It was recommended that he try over-the-counter Delsym to help with cough suppression. He continue to have a mild dry cough, described as having to clear this throat. He has no significant sinus symptoms. He has some nocturnal wheezing which is not new. He is taking diuretics as prescribed. Denies fever, sweats, shortness of breath, hemoptysis, chest pain/discomfort or weight loss. Does not appear that patient has had formal pulmonary function testing.  He is not on maintenance inhaler.    Allergies  Allergen Reactions   Aspirin Other (See Comments)    High doses causes stomach ulcer and bleeding    Immunization History  Administered Date(s) Administered   Fluad Quad(high Dose 65+) 05/01/2019   Influenza Split 05/27/2011, 05/30/2012   Influenza  Whole 07/10/2009, 05/10/2010   Influenza, High Dose Seasonal PF 07/08/2013, 07/14/2015, 06/20/2016, 05/24/2017, 06/12/2018   Influenza,inj,Quad PF,6+ Mos 06/25/2014   Influenza-Unspecified 10/06/2020   PFIZER Comirnaty(Gray Top)Covid-19 Tri-Sucrose Vaccine 03/16/2021   PFIZER(Purple Top)SARS-COV-2 Vaccination 09/11/2019, 10/02/2019, 06/15/2020   Pneumococcal Conjugate-13 11/25/2014   Pneumococcal Polysaccharide-23 03/07/2013   Tetanus 03/07/2013   Zoster Recombinat (Shingrix) 04/03/2020, 07/27/2020    Past Medical History:  Diagnosis Date   ANEMIA DUE TO CHRONIC BLOOD LOSS 03/13/2007   CAROTID ARTERY STENOSIS 05/10/2010   CHF (congestive heart failure) ([facility]    DIABETES MELLITUS, TYPE II 09/19/2007   DISEASE, CEREBROVASCULAR NEC 03/05/2007   GERD 03/13/2007   HYPERLIPIDEMIA 03/05/2007   HYPERTENSION 03/05/2007   HYPOKALEMIA 11/09/2009   KNEE PAIN, RIGHT 11/09/2009   PROSTATE CANCER, HX OF 03/05/2007   RENAL DISEASE, CHRONIC 02/03/2009    Tobacco History: Social History   Tobacco Use  Smoking Status Former   Packs/day: 1.00   Years: 10.00   Pack years: 10.00   Types: Cigarettes   Quit date: 08/22/1974   Years since quitting: 46.6  Smokeless Tobacco Never   Counseling given: Not Answered   Outpatient Medications Prior to Visit  Medication Sig Dispense Refill   ACCU-CHEK GUIDE test strip USED TO CHECK BLOOD GLUCOSE TWICE A DAY OR AS NEEDED 100 strip 1   Accu-Chek Softclix Lancets lancets USED TO CHECK BLOOD GLUCOSE TWICE A DAY OR AS NEEDED 100 each 6   acetaminophen (TYLENOL) 325 MG tablet Take 325 mg by mouth 4 (four) times daily.  amLODipine (NORVASC) 10 MG tablet TAKE 1 TABLET BY MOUTH EVERY DAY 90 tablet 1   atorvastatin (LIPITOR) 20 MG tablet TAKE 1 TABLET BY MOUTH EVERY DAY 90 tablet 3   Blood Glucose Monitoring Suppl (ACCU-CHEK AVIVA PLUS) w/Device KIT Used to check blood glucose 2 times a day or PRN 1 kit 0   cholecalciferol (VITAMIN D3) 25 MCG (1000 UNIT) tablet Take  1,000 Units by mouth once a week.     cloNIDine (CATAPRES) 0.3 MG tablet Take 1 tablet (0.3 mg total) by mouth 3 (three) times daily. 60 tablet 0   clopidogrel (PLAVIX) 75 MG tablet TAKE 1 TABLET BY MOUTH EVERY DAY (Patient taking differently: Take 75 mg by mouth daily with supper.) 90 tablet 3   doxazosin (CARDURA) 2 MG tablet Take 2 mg by mouth at bedtime.   3   esomeprazole (NEXIUM) 40 MG capsule TAKE 1 CAPSULE BY MOUTH EVERY DAY (Patient taking differently: Take 40 mg by mouth daily.) 90 capsule 3   glipiZIDE (GLUCOTROL XL) 5 MG 24 hr tablet TAKE 1 TABLET BY MOUTH EVERY DAY 90 tablet 0   hydrocortisone (ANUSOL-HC) 2.5 % rectal cream PLACE 1 APPLICATION RECTALLY 2 TIMES A DAY. 30 g 0   isosorbide-hydrALAZINE (BIDIL) 20-37.5 MG tablet Take 1 tablet by mouth 3 (three) times daily.     lactulose (CHRONULAC) 10 GM/15ML solution TAKE 15 ML BY MOUTH 2 TIMES DAILY AS NEEDED FOR MILD CONSTIPATION. 237 mL 2   polyethylene glycol (MIRALAX / GLYCOLAX) 17 g packet Take 17 g by mouth daily as needed for mild constipation. 14 each 0   Propylene Glycol (SYSTANE BALANCE OP) Place 1 drop into both eyes daily as needed (dry eyes).     saccharomyces boulardii (FLORASTOR) 250 MG capsule Take 1 capsule (250 mg total) by mouth 2 (two) times daily. 30 capsule 0   triamcinolone cream (KENALOG) 0.1 % Apply 1 application topically daily.     KLOR-CON M10 10 MEQ tablet Take 2 tablets (20 mEq total) by mouth 2 (two) times daily. (Patient taking differently: Take 10 mEq by mouth 2 (two) times daily.) 120 tablet 0   metoprolol succinate (TOPROL-XL) 100 MG 24 hr tablet Take 1 tablet (100 mg total) by mouth daily. Take with or immediately following a meal. 30 tablet 0   torsemide (DEMADEX) 20 MG tablet Take 1 tablet (20 mg total) by mouth 2 (two) times daily. 60 tablet 0   POLY-IRON 150 150 MG capsule Take 150 mg by mouth daily. (Patient not taking: Reported on 04/06/2021)     Facility-Administered Medications Prior to Visit   Medication Dose Route Frequency Provider Last Rate Last Admin   ammonium lactate (LAC-HYDRIN) 12 % lotion   Topical PRN Felipa Furnace, DPM        Review of Systems  Review of Systems  Constitutional: Negative.   HENT:  Negative for congestion and postnasal drip.   Respiratory:  Positive for cough and chest tightness. Negative for shortness of breath and wheezing.   Cardiovascular:  Positive for leg swelling.    Physical Exam  BP (!) 148/64 (BP Location: Left Arm, Patient Position: Sitting, Cuff Size: Normal)   Pulse (!) 56   Temp 98.1 F (36.7 C) (Oral)   Ht '5\' 7"'  (1.702 m)   Wt 172 lb 12.8 oz (78.4 kg)   SpO2 98%   BMI 27.06 kg/m  Physical Exam Constitutional:      Appearance: Normal appearance.  HENT:     Head:  Normocephalic and atraumatic.     Mouth/Throat:     Mouth: Mucous membranes are moist.     Pharynx: Oropharynx is clear.  Cardiovascular:     Rate and Rhythm: Normal rate and regular rhythm.  Pulmonary:     Effort: Pulmonary effort is normal.     Breath sounds: Normal breath sounds. No wheezing, rhonchi or rales.  Musculoskeletal:        General: Normal range of motion.  Skin:    General: Skin is warm and dry.  Neurological:     General: No focal deficit present.     Mental Status: He is alert and oriented to person, place, and time. Mental status is at baseline.  Psychiatric:        Mood and Affect: Mood normal.        Behavior: Behavior normal.        Thought Content: Thought content normal.        Judgment: Judgment normal.     Lab Results:  CBC    Component Value Date/Time   WBC 6.8 10/10/2020 0653   RBC 3.64 (L) 10/10/2020 0653   HGB 10.1 (L) 10/10/2020 0653   HGB 9.7 (L) 08/19/2019 1107   HCT 29.1 (L) 10/10/2020 0653   PLT 285 10/10/2020 0653   PLT 262 08/19/2019 1107   MCV 79.9 (L) 10/10/2020 0653   MCH 27.7 10/10/2020 0653   MCHC 34.7 10/10/2020 0653   RDW 13.1 10/10/2020 0653   LYMPHSABS 1.1 10/09/2020 1035   MONOABS 0.5  10/09/2020 1035   EOSABS 0.1 10/09/2020 1035   BASOSABS 0.0 10/09/2020 1035    BMET    Component Value Date/Time   NA 140 10/12/2020 0339   NA 138 12/02/2019 0000   K 3.7 10/12/2020 0339   CL 105 10/12/2020 0339   CO2 26 10/12/2020 0339   GLUCOSE 168 (H) 10/12/2020 0339   GLUCOSE 112 (H) 06/19/2006 0948   BUN 104 (H) 10/12/2020 0339   BUN 29 (A) 12/02/2019 0000   CREATININE 4.78 (H) 10/12/2020 0339   CREATININE 3.59 (H) 03/03/2020 1454   CALCIUM 9.0 10/12/2020 0339   GFRNONAA 11 (L) 10/12/2020 0339   GFRNONAA 20 (L) 08/19/2019 1107   GFRAA 19 (L) 02/26/2020 0724   GFRAA 23 (L) 08/19/2019 1107    BNP    Component Value Date/Time   BNP 1,223.4 (H) 09/16/2020 0938    ProBNP No results found for: PROBNP  Imaging: No results found.   Assessment & Plan:   Upper airway cough syndrome -  Patient developed dry cough x 1 week ago with associated wheezing. No shortness of breath. He is afebrile and lungs were clear on exam. Cough is consistent with upper airway cough syndrome. He is compliant with GERD medication. Recommend starting flonase nasal spray and sending in short prednisone taper. No indication for antibiotics at this time. Checking CXR today to rule out infiltrate, pleural effusion or enlarging lung mass. FU if cough is not better.   Pulmonary nodule 1 cm or greater in diameter - Right upper lobe pulmonary nodule being monitored by [name]. Planning for repeat Super D CT chest in September 2022. No concerning findings on exam. Denies hemoptysis, shortness of breath or weight loss.    Martyn Ehrich, NP 04/06/2021

## 2021-04-06 NOTE — Assessment & Plan Note (Addendum)
-   Right upper lobe pulmonary nodule being monitored by [name]. Planning for repeat Super D CT chest in September 2022. No concerning findings on exam. Denies hemoptysis, shortness of breath or weight loss.

## 2021-04-08 DIAGNOSIS — R809 Proteinuria, unspecified: Secondary | ICD-10-CM | POA: Diagnosis not present

## 2021-04-08 DIAGNOSIS — N184 Chronic kidney disease, stage 4 (severe): Secondary | ICD-10-CM | POA: Diagnosis not present

## 2021-04-08 DIAGNOSIS — D631 Anemia in chronic kidney disease: Secondary | ICD-10-CM | POA: Diagnosis not present

## 2021-04-08 DIAGNOSIS — I129 Hypertensive chronic kidney disease with stage 1 through stage 4 chronic kidney disease, or unspecified chronic kidney disease: Secondary | ICD-10-CM | POA: Diagnosis not present

## 2021-04-08 DIAGNOSIS — N2581 Secondary hyperparathyroidism of renal origin: Secondary | ICD-10-CM | POA: Diagnosis not present

## 2021-04-08 DIAGNOSIS — E1122 Type 2 diabetes mellitus with diabetic chronic kidney disease: Secondary | ICD-10-CM | POA: Diagnosis not present

## 2021-04-08 DIAGNOSIS — E876 Hypokalemia: Secondary | ICD-10-CM | POA: Diagnosis not present

## 2021-04-08 DIAGNOSIS — I5032 Chronic diastolic (congestive) heart failure: Secondary | ICD-10-CM | POA: Diagnosis not present

## 2021-04-09 ENCOUNTER — Telehealth: Payer: Self-pay | Admitting: Emergency Medicine

## 2021-04-09 NOTE — Telephone Encounter (Signed)
Attempted to contact patient, voicemail is full.

## 2021-04-26 ENCOUNTER — Other Ambulatory Visit: Payer: Self-pay | Admitting: Adult Health

## 2021-04-27 ENCOUNTER — Ambulatory Visit: Payer: Medicare PPO | Admitting: Emergency Medicine

## 2021-04-27 NOTE — Telephone Encounter (Signed)
Pt needs an appt

## 2021-04-28 ENCOUNTER — Inpatient Hospital Stay: Admission: RE | Admit: 2021-04-28 | Payer: Medicare PPO | Source: Ambulatory Visit

## 2021-05-10 ENCOUNTER — Telehealth: Payer: Self-pay | Admitting: Primary Care

## 2021-05-10 NOTE — Telephone Encounter (Signed)
Called and spoke with pt and he stated that he was given prednisone but he never took this. This was given on 08/16 as a taper.  He stated that he was given this for upper airway congestion and he has been using mucinex to help with the congestion and this did help for a bit.  He stated now he started wheezing some last night and was not able to sleep.  Pt did not appear to be in any type of distress when we spoke.  He is concerned about taking the prednisone due to a hx of bleeding ulcer and bleeding in his intestines in the past.   RB please advise.  Pt has a scan scheduled for 10/3 and does have appt with you on 10/06.

## 2021-05-10 NOTE — Telephone Encounter (Signed)
Called and spoke with pt and he is aware of RB recs.  He will call back if the symptoms seem to get worse.

## 2021-05-10 NOTE — Telephone Encounter (Signed)
I think at this point he should defer the prednisone. If cough and congestion are increasing, or of fevers/ constitutional sx then would have him see APP prior to our OV

## 2021-05-13 ENCOUNTER — Other Ambulatory Visit: Payer: Self-pay | Admitting: Adult Health

## 2021-05-17 ENCOUNTER — Telehealth: Payer: Self-pay | Admitting: Emergency Medicine

## 2021-05-17 NOTE — Telephone Encounter (Signed)
Please make sure he is still on GERD meds and the allergy regimen started last month by BW Have him try mucinex / guaifenesin 600mg  bid if not already using to see if this helps Needs an OV w APP to assess further, and then me in my next available.

## 2021-05-17 NOTE — Telephone Encounter (Signed)
Called and spoke with patient. He verbalized understanding of RB's recs.   Nothing further needed at time of call.

## 2021-05-17 NOTE — Telephone Encounter (Signed)
Called and spoke with patient who is calling because he is having symptoms of shortness of breath more at night and with exertion and dry cough. States he feels something but can't get it up. Symptoms started about 4 days ago. Denies fever or any other symptoms.  [name] please advise

## 2021-05-24 ENCOUNTER — Other Ambulatory Visit: Payer: Self-pay

## 2021-05-24 ENCOUNTER — Ambulatory Visit (INDEPENDENT_AMBULATORY_CARE_PROVIDER_SITE_OTHER)
Admission: RE | Admit: 2021-05-24 | Discharge: 2021-05-24 | Disposition: A | Discharge status: Home / Self Care | Payer: Medicare PPO | Source: Ambulatory Visit | Attending: Emergency Medicine | Admitting: Emergency Medicine

## 2021-05-24 DIAGNOSIS — R911 Solitary pulmonary nodule: Secondary | ICD-10-CM | POA: Diagnosis not present

## 2021-05-24 DIAGNOSIS — J90 Pleural effusion, not elsewhere classified: Secondary | ICD-10-CM | POA: Diagnosis not present

## 2021-05-27 ENCOUNTER — Other Ambulatory Visit: Payer: Self-pay

## 2021-05-27 ENCOUNTER — Ambulatory Visit: Payer: Medicare PPO | Admitting: Emergency Medicine

## 2021-05-27 ENCOUNTER — Encounter: Payer: Self-pay | Admitting: Emergency Medicine

## 2021-05-27 VITALS — BP 146/72 | HR 80 | Temp 98.1°F | Ht 67.0 in | Wt 186.0 lb

## 2021-05-27 DIAGNOSIS — R0609 Other forms of dyspnea: Secondary | ICD-10-CM

## 2021-05-27 DIAGNOSIS — R058 Other specified cough: Secondary | ICD-10-CM | POA: Diagnosis not present

## 2021-05-27 DIAGNOSIS — Z23 Encounter for immunization: Secondary | ICD-10-CM | POA: Diagnosis not present

## 2021-05-27 DIAGNOSIS — R911 Solitary pulmonary nodule: Secondary | ICD-10-CM | POA: Diagnosis not present

## 2021-05-27 LAB — BASIC METABOLIC PANEL
BUN: 51 mg/dL — ABNORMAL HIGH (ref 6–23)
CO2: 25 mEq/L (ref 19–32)
Calcium: 8.9 mg/dL (ref 8.4–10.5)
Chloride: 110 mEq/L (ref 96–112)
Creatinine, Ser: 4.07 mg/dL — ABNORMAL HIGH (ref 0.40–1.50)
GFR: 12.81 mL/min — CL (ref >60.00)
Glucose, Bld: 100 mg/dL — ABNORMAL HIGH (ref 70–99)
Potassium: 3 mEq/L — ABNORMAL LOW (ref 3.5–5.1)
Sodium: 145 mEq/L (ref 135–145)

## 2021-05-27 NOTE — Patient Instructions (Addendum)
We will check lab work today Please increase your torsemide to 3 times a day for 2 days, then go back to your usual twice a day schedule.  Let nephrology know that we have made this brief change. Increase your Nexium to twice a day for the next month.  Then go back to once a day. Continue the Mucinex twice a day as you have been taking it. We will plan to repeat a CT scan/PET scan in 6 months, April 2023. Follow here with APP in 1 month to assess for coughing, shortness of breath. Follow [name] in 6 months after your CT scan so that we can review the results together.

## 2021-05-27 NOTE — Progress Notes (Signed)
Subjective:    Patient ID: Austin [name], male    DOB: [birth year], 86 y.o.   MRN: [id number]  HPI 86 year old former smoker (10 pack years) with a history of hypertension with diastolic heart failure, diabetes, CKD stage IV, hyperlipidemia, carotid and cerebrovascular disease (Plavix), prostate cancer.  He has been in and out of the hospital in January and February 2022 for exacerbations of diastolic CHF and volume overload.  As part of his evaluation he was found to have a right upper lobe 2 cm nodule.  He presents today for further evaluation of this. His S Cr 4.78.   ROV 11/24/20 --[name] is 39, has chronic renal insufficiency and diastolic CHF.  We have been following a 2.3x1.8 cm right upper lobe pulmonary nodule that has increased in size on CT chest compared with 2018. He underwent a PET scan on 11/16/2020 that showed no mediastinal hypermetabolism.  The ill-defined right upper lobe subpleural density was 1.4 x 1.2 cm, slightly larger than CT chest 2018 but smaller than most recent Ct 08/2020.  There is low-level metabolic activity (SUV 2.1).  Other stable subpleural nodules are present including stable 1.1 groundglass nodule in the left upper lobe  ROV 05/27/21 --86 year old man with a history of hypertension with diastolic CHF, chronic renal insufficiency, chronic cough with upper airway cough syndrome in the setting of GERD and chronic rhinitis.  He was treated briefly with prednisone taper for this in mid August but did not take it due to concern about possible side effects, GI bleeding.  We have also been following a right upper lobe pulmonary nodule, 1.1 cm groundglass left upper lobe pulmonary nodule. He describes a lot of cough and throat clearing, no mucous production. Also with intermittent dyspnea when climbing stairs - noticed over the last 2 weeks. He feels that he is accumulating LE edema. Wt has been stable. He has been experiencing restriction when sitting. No overt GERD sx on Nexium.  Minimal nasal congestion. He just started some mucinex and feels that he has benfited some.    CT scan of the chest done on 05/24/2021 reviewed by me, shows 2.4 cm posterior right upper lobe pulmonary nodule that is stable compared with 09/17/2020 but which is larger and slightly more dense than prior scans 2018.  The 1.1 cm left upper lobe groundglass nodule is stable   Review of Systems As per HPI     Objective:   Physical Exam Vitals:   05/27/21 1027  BP: (!) 146/72  Pulse: 80  Temp: 98.1 F (36.7 C)  TempSrc: Oral  SpO2: 95%  Weight: 186 lb (84.4 kg)  Height: 5\' 7"  (1.702 m)   Gen: Pleasant, well-nourished, in no distress,  normal affect  ENT: No lesions,  mouth clear,  oropharynx clear, no postnasal drip  Neck: No JVD, no stridor  Lungs: No use of accessory muscles, no crackles or wheezing on normal respiration, no wheeze on forced expiration  Cardiovascular: RRR, heart sounds normal, no murmur or gallops, no peripheral edema  Musculoskeletal: No deformities, no cyanosis or clubbing  Neuro: alert, awake, non focal  Skin: Warm, no lesions or rash     Assessment & Plan:  Pulmonary nodule 1 cm or greater in diameter 2.4 cm right upper lobe nodule stable in size on recent scans but overall remain suspicious, has increased in size going back to 2018.  1.1 cm left upper lobe groundglass nodule stable.  We will plan to defer invasive testing for now, repeat his  PET scan/CT in 6 months, April 2023.  Depending on results at that time we will decide whether biopsy would be beneficial.  Upper airway cough syndrome Continues to have a lot of upper airway disease, throat clearing.  Minimal mucus production.  He did not have any overt GERD symptoms, remains on Nexium.   Increase your Nexium to twice a day for the next month.  Then go back to once a day. Continue the Mucinex twice a day as you have been taking it.  Dyspnea Multifactorial, with contribution of deconditioning,  probable labile volume status. ? Anemia. No suspected significant contribution of obstruction. Probably need to obtain PFT when stable to do so.    We will check lab work today Please increase your torsemide to 3 times a day for 2 days, then go back to your usual twice a day schedule.  Let nephrology know that we have made this brief change.  Baltazar Apo, MD, PhD 06/01/2021, 8:31 PM [facility] [phone] or if no answer before 7:00PM call [phone] For any issues after 7:00PM please call eLink [phone]

## 2021-05-28 ENCOUNTER — Other Ambulatory Visit: Payer: Self-pay | Admitting: Adult Health

## 2021-06-01 ENCOUNTER — Encounter: Payer: Self-pay | Admitting: Emergency Medicine

## 2021-06-01 DIAGNOSIS — R0600 Dyspnea, unspecified: Secondary | ICD-10-CM | POA: Insufficient documentation

## 2021-06-01 NOTE — Assessment & Plan Note (Signed)
2.4 cm right upper lobe nodule stable in size on recent scans but overall remain suspicious, has increased in size going back to 2018.  1.1 cm left upper lobe groundglass nodule stable.  We will plan to defer invasive testing for now, repeat his PET scan/CT in 6 months, April 2023.  Depending on results at that time we will decide whether biopsy would be beneficial.

## 2021-06-01 NOTE — Assessment & Plan Note (Signed)
Multifactorial, with contribution of deconditioning, probable labile volume status. ? Anemia. No suspected significant contribution of obstruction. Probably need to obtain PFT when stable to do so.    We will check lab work today Please increase your torsemide to 3 times a day for 2 days, then go back to your usual twice a day schedule.  Let nephrology know that we have made this brief change.

## 2021-06-01 NOTE — Assessment & Plan Note (Addendum)
Continues to have a lot of upper airway disease, throat clearing.  Minimal mucus production.  He did not have any overt GERD symptoms, remains on Nexium.   Increase your Nexium to twice a day for the next month.  Then go back to once a day. Continue the Mucinex twice a day as you have been taking it.

## 2021-06-02 DIAGNOSIS — I10 Essential (primary) hypertension: Secondary | ICD-10-CM | POA: Diagnosis not present

## 2021-06-02 DIAGNOSIS — I350 Nonrheumatic aortic (valve) stenosis: Secondary | ICD-10-CM | POA: Diagnosis not present

## 2021-06-02 DIAGNOSIS — E785 Hyperlipidemia, unspecified: Secondary | ICD-10-CM | POA: Diagnosis not present

## 2021-06-02 DIAGNOSIS — I5030 Unspecified diastolic (congestive) heart failure: Secondary | ICD-10-CM | POA: Diagnosis not present

## 2021-06-04 ENCOUNTER — Other Ambulatory Visit: Payer: Medicare PPO

## 2021-06-09 ENCOUNTER — Telehealth: Payer: Self-pay | Admitting: Emergency Medicine

## 2021-06-09 NOTE — Telephone Encounter (Signed)
Spoke with pt who was inquiring on results on lab work form 05/27/21 and also CT done in 05/24/21. Pt did c/o increased SOB but he stated it was no worse the normal. Pt denied N/V/D or chills and fever. Pt was scheduled with NP Geraldo Pitter on Friday 06/11/21. Pt was istructed Geraldo Pitter would review those results with him. Pt stated understanding.  Routing to [employer]  as Juluis Rainier. Nothing further needed at this time.

## 2021-06-11 ENCOUNTER — Other Ambulatory Visit: Payer: Self-pay

## 2021-06-11 ENCOUNTER — Ambulatory Visit: Payer: Medicare PPO | Admitting: Primary Care

## 2021-06-11 ENCOUNTER — Encounter: Payer: Self-pay | Admitting: Primary Care

## 2021-06-11 ENCOUNTER — Telehealth: Payer: Self-pay | Admitting: Primary Care

## 2021-06-11 VITALS — BP 138/82 | HR 76 | Ht 67.0 in | Wt 188.2 lb

## 2021-06-11 DIAGNOSIS — R0602 Shortness of breath: Secondary | ICD-10-CM | POA: Diagnosis not present

## 2021-06-11 DIAGNOSIS — R911 Solitary pulmonary nodule: Secondary | ICD-10-CM | POA: Diagnosis not present

## 2021-06-11 LAB — BASIC METABOLIC PANEL
BUN: 54 mg/dL — ABNORMAL HIGH (ref 6–23)
CO2: 24 mEq/L (ref 19–32)
Calcium: 9.2 mg/dL (ref 8.4–10.5)
Chloride: 114 mEq/L — ABNORMAL HIGH (ref 96–112)
Creatinine, Ser: 3.7 mg/dL — ABNORMAL HIGH (ref 0.40–1.50)
GFR: 14.36 mL/min — CL (ref >60.00)
Glucose, Bld: 118 mg/dL — ABNORMAL HIGH (ref 70–99)
Potassium: 3.5 mEq/L (ref 3.5–5.1)
Sodium: 147 mEq/L — ABNORMAL HIGH (ref 135–145)

## 2021-06-11 LAB — BRAIN NATRIURETIC PEPTIDE: Pro B Natriuretic peptide (BNP): 1890 pg/mL — ABNORMAL HIGH (ref 0.0–100.0)

## 2021-06-11 MED ORDER — ALBUTEROL SULFATE HFA 108 (90 BASE) MCG/ACT IN AERS: 2.0000 | INHALATION_SPRAY | Freq: Four times a day (QID) | RESPIRATORY_TRACT | 1 refills | Status: DC | PRN

## 2021-06-11 NOTE — Assessment & Plan Note (Addendum)
-   Patient reports increased shortness of breath with associated cough x 2 week. Suspect multifactorial d/t hx congestive heart failure, obesity and lung mass. CT chest on 05/24/21 showed no evidence of pneumonia. Mild-moderate cardiomegaly, Small pericardial effusion. Trace dependent bilateral pleural effusion. We will check BNP and BMET. Advised he continue to take mucinex twice daily along with Nexium daily for cough. He is maintained on Torsemide 20mg  twice daily + KLOR supplement for HF. We have sent in RX for albuterol hfa for him to use as needed for sob/wheezing.    Recommendations:  - Continue Mucinex 600mg  twice a day --Continue Nexium as directed  - Continue Torsemide 20mg  twice a day + potassium supplement  Rx: - Albuterol 2 puffs every 4-6 hours as needed for shortness of breath/wheezing   Orders: - Labs (ordered)  Follow-up: - Due for visit with [name] in April or sooner if symptoms do not improve or worsen

## 2021-06-11 NOTE — Patient Instructions (Addendum)
Recommendations: - Continue Mucinex 600mg  twice a day --Continue Nexium twice a day - Continue Torsemide 20mg  twice a day + potassium supplement  Rx: - Albuterol 2 puffs every 4-6 hours as needed for shortness of breath/wheezing   Orders: - Labs (ordered)  Follow-up: - Due for visit with [name] in April  - As needed if symptoms do not improve or worsen

## 2021-06-11 NOTE — Assessment & Plan Note (Addendum)
-   CT chest on 05/24/21 showed spiculated RUL pulmonary nodule measuring 2.4cm, mildly increased in density from 1.9cm on 11/03/16. He scheduled for PET scan on 06/14/21.

## 2021-06-11 NOTE — Telephone Encounter (Signed)
Per Benay Spice, she Warden/ranger via [employer]. Will close encounter.

## 2021-06-11 NOTE — Progress Notes (Signed)
_0  ID: Austin [name], male    DOB: 1935-05-25, 86 y.o.   MRN: [id number]  Chief Complaint  Patient presents with   Cough   Shortness of Breath    Referring provider: Dorothyann Peng, NP  HPI: 86 year old male, former smoker quit 1976 (10-pack-year history).  Past medical history significant for mass of right lung, congestive heart failure, coronary artery stenosis, hypertension, GERD, diabetes, chronic kidney disease stage IV, history of prostate cancer.  Patient of [name], last seen in office on 05/27/21 pulmonary nodule right upper lobe.    Previous [facility] pulmonary encounter:  04/06/2021 Patient presents today for 65-monthfollow-up.  During his last visit with Dr. BLamonte Sakaiwas felt that his right upper lobe pulmonary nodule was smaller on PET scan versus CT chest that was done in January 2022 and was not hypermetabolic.  It is not recommended that he proceed with bronchoscopy at that time.  Plan was to repeat super D CT chest in September 2022 and follow back up with Dr. BLamonte Sakaiafterwards.  In the interim patient called our office on August 1 with reports of nonproductive cough x1 week and increased wheezing when laying down at night.  It was recommended that he try over-the-counter Delsym to help with cough suppression. He continue to have a mild dry cough, described as having to clear this throat. He has no significant sinus symptoms. He has some nocturnal wheezing which is not new. He is taking diuretics as prescribed. Denies fever, sweats, shortness of breath, hemoptysis, chest pain/discomfort or weight loss. Does not appear that patient has had formal pulmonary function testing.  He is not on maintenance inhaler.   ROV 05/27/21 --86year old man with a history of hypertension with diastolic CHF, chronic renal insufficiency, chronic cough with upper airway cough syndrome in the setting of GERD and chronic rhinitis.  He was treated briefly with prednisone taper for this in mid August but did  not take it due to concern about possible side effects, GI bleeding.  We have also been following a right upper lobe pulmonary nodule, 1.1 cm groundglass left upper lobe pulmonary nodule. He describes a lot of cough and throat clearing, no mucous production. Also with intermittent dyspnea when climbing stairs - noticed over the last 2 weeks. He feels that he is accumulating LE edema. Wt has been stable. He has been experiencing restriction when sitting. No overt GERD sx on Nexium. Minimal nasal congestion. He just started some mucinex and feels that he has benfited some.    06/11/2021- Interim hx  Patient presents today for acute office visit reports of increased shortness of breath associated cough x 2 weeks. He has some associated nocturnal wheezing. His cough is dry and nonproductive. He did not take prednisone that was prescribed several weeks ago for his cough d/t possible side effects listed for medication. He is taking mucinex and nexium for cough. He is also taking Torsemde and Potassium as directed for history of heart failure. CT chest on 05/24/21 showed spiculated RUL pulmonary nodule measuring 2.4cm, mildly increased in density from 1.9cm on 11/03/16. He scheduled for PET scan on 06/14/21.     Allergies  Allergen Reactions   Aspirin Other (See Comments)    High doses causes stomach ulcer and bleeding    Immunization History  Administered Date(s) Administered   Fluad Quad(high Dose 65+) 05/01/2019, 05/27/2021   Influenza Split 05/27/2011, 05/30/2012   Influenza Whole 07/10/2009, 05/10/2010   Influenza, High Dose Seasonal PF 07/08/2013, 07/14/2015, 06/20/2016, 05/24/2017, 06/12/2018  Influenza,inj,Quad PF,6+ Mos 06/25/2014   Influenza-Unspecified 10/06/2020   PFIZER Comirnaty(Gray Top)Covid-19 Tri-Sucrose Vaccine 03/16/2021   PFIZER(Purple Top)SARS-COV-2 Vaccination 09/11/2019, 10/02/2019, 06/15/2020   Pneumococcal Conjugate-13 11/25/2014   Pneumococcal Polysaccharide-23 03/07/2013    Tetanus 03/07/2013   Zoster Recombinat (Shingrix) 04/03/2020, 07/27/2020    Past Medical History:  Diagnosis Date   ANEMIA DUE TO CHRONIC BLOOD LOSS 03/13/2007   CAROTID ARTERY STENOSIS 05/10/2010   CHF (congestive heart failure) ([place])    DIABETES MELLITUS, TYPE II 09/19/2007   DISEASE, CEREBROVASCULAR NEC 03/05/2007   GERD 03/13/2007   HYPERLIPIDEMIA 03/05/2007   HYPERTENSION 03/05/2007   HYPOKALEMIA 11/09/2009   KNEE PAIN, RIGHT 11/09/2009   PROSTATE CANCER, HX OF 03/05/2007   RENAL DISEASE, CHRONIC 02/03/2009    Tobacco History: Social History   Tobacco Use  Smoking Status Former   Packs/day: 1.00   Years: 10.00   Pack years: 10.00   Types: Cigarettes   Quit date: 08/22/1974   Years since quitting: 46.8  Smokeless Tobacco Never   Counseling given: Not Answered   Outpatient Medications Prior to Visit  Medication Sig Dispense Refill   ACCU-CHEK GUIDE test strip USED TO CHECK BLOOD GLUCOSE TWICE A DAY OR AS NEEDED 100 strip 1   Accu-Chek Softclix Lancets lancets USED TO CHECK BLOOD GLUCOSE TWICE A DAY OR AS NEEDED 100 each 6   acetaminophen (TYLENOL) 325 MG tablet Take 325 mg by mouth 4 (four) times daily.     amLODipine (NORVASC) 10 MG tablet TAKE 1 TABLET BY MOUTH EVERY DAY 90 tablet 1   atorvastatin (LIPITOR) 20 MG tablet TAKE 1 TABLET BY MOUTH EVERY DAY 90 tablet 3   Blood Glucose Monitoring Suppl (ACCU-CHEK AVIVA PLUS) w/Device KIT Used to check blood glucose 2 times a day or PRN 1 kit 0   cholecalciferol (VITAMIN D3) 25 MCG (1000 UNIT) tablet Take 1,000 Units by mouth once a week.     cloNIDine (CATAPRES) 0.3 MG tablet Take 1 tablet (0.3 mg total) by mouth 3 (three) times daily. 60 tablet 0   clopidogrel (PLAVIX) 75 MG tablet TAKE 1 TABLET BY MOUTH EVERY DAY (Patient taking differently: Take 75 mg by mouth daily with supper.) 90 tablet 3   doxazosin (CARDURA) 2 MG tablet Take 2 mg by mouth at bedtime.   3   esomeprazole (NEXIUM) 40 MG capsule TAKE 1 CAPSULE BY MOUTH EVERY  DAY 90 capsule 3   glipiZIDE (GLUCOTROL XL) 5 MG 24 hr tablet TAKE 1 TABLET BY MOUTH EVERY DAY 90 tablet 0   hydrocortisone (ANUSOL-HC) 2.5 % rectal cream PLACE 1 APPLICATION RECTALLY 2 TIMES A DAY. 30 g 0   isosorbide-hydrALAZINE (BIDIL) 20-37.5 MG tablet Take 1 tablet by mouth 3 (three) times daily.     lactulose (CHRONULAC) 10 GM/15ML solution TAKE 15 ML BY MOUTH 2 TIMES DAILY AS NEEDED FOR MILD CONSTIPATION. 237 mL 2   polyethylene glycol (MIRALAX / GLYCOLAX) 17 g packet Take 17 g by mouth daily as needed for mild constipation. 14 each 0   Propylene Glycol (SYSTANE BALANCE OP) Place 1 drop into both eyes daily as needed (dry eyes).     saccharomyces boulardii (FLORASTOR) 250 MG capsule Take 1 capsule (250 mg total) by mouth 2 (two) times daily. 30 capsule 0   triamcinolone cream (KENALOG) 0.1 % Apply 1 application topically daily.     KLOR-CON M10 10 MEQ tablet Take 2 tablets (20 mEq total) by mouth 2 (two) times daily. (Patient taking differently: Take 10 mEq by  mouth 2 (two) times daily.) 120 tablet 0   metoprolol succinate (TOPROL-XL) 100 MG 24 hr tablet Take 1 tablet (100 mg total) by mouth daily. Take with or immediately following a meal. 30 tablet 0   predniSONE (DELTASONE) 10 MG tablet Take 4 tabs by mouth daily x 2 days; then 2 tabs by mouth daily x 2 days; then 1 tab by mouth daily x 2 days (Patient not taking: Reported on 06/11/2021) 14 tablet 0   torsemide (DEMADEX) 20 MG tablet Take 1 tablet (20 mg total) by mouth 2 (two) times daily. 60 tablet 0   Facility-Administered Medications Prior to Visit  Medication Dose Route Frequency Provider Last Rate Last Admin   ammonium lactate (LAC-HYDRIN) 12 % lotion   Topical PRN Felipa Furnace, DPM        Review of Systems  Review of Systems  Constitutional:  Positive for fatigue.  HENT: Negative.    Respiratory:  Positive for cough and shortness of breath.   Cardiovascular:  Positive for leg swelling.    Physical Exam  BP 138/82    Pulse 76   Ht 5' 7" (1.702 m)   Wt 188 lb 3.2 oz (85.4 kg)   SpO2 95%   BMI 29.48 kg/m  Physical Exam Constitutional:      Appearance: Normal appearance. He is well-developed.  HENT:     Head: Normocephalic and atraumatic.  Cardiovascular:     Rate and Rhythm: Normal rate and regular rhythm.     Comments: + 2-3 BLE edema  Pulmonary:     Effort: Pulmonary effort is normal.     Breath sounds: Normal breath sounds. No decreased breath sounds, wheezing or rhonchi.  Musculoskeletal:        General: Normal range of motion.     Cervical back: Normal range of motion and neck supple.  Skin:    General: Skin is warm and dry.  Neurological:     General: No focal deficit present.     Mental Status: He is alert. Mental status is at baseline. He is disoriented.  Psychiatric:        Mood and Affect: Mood normal.        Behavior: Behavior normal.        Thought Content: Thought content normal.        Judgment: Judgment normal.     Lab Results:  CBC    Component Value Date/Time   WBC 6.8 10/10/2020 0653   RBC 3.64 (L) 10/10/2020 0653   HGB 10.1 (L) 10/10/2020 0653   HGB 9.7 (L) 08/19/2019 1107   HCT 29.1 (L) 10/10/2020 0653   PLT 285 10/10/2020 0653   PLT 262 08/19/2019 1107   MCV 79.9 (L) 10/10/2020 0653   MCH 27.7 10/10/2020 0653   MCHC 34.7 10/10/2020 0653   RDW 13.1 10/10/2020 0653   LYMPHSABS 1.1 10/09/2020 1035   MONOABS 0.5 10/09/2020 1035   EOSABS 0.1 10/09/2020 1035   BASOSABS 0.0 10/09/2020 1035    BMET    Component Value Date/Time   NA 145 05/27/2021 1119   NA 138 12/02/2019 0000   K 3.0 (L) 05/27/2021 1119   CL 110 05/27/2021 1119   CO2 25 05/27/2021 1119   GLUCOSE 100 (H) 05/27/2021 1119   GLUCOSE 112 (H) 06/19/2006 0948   BUN 51 (H) 05/27/2021 1119   BUN 29 (A) 12/02/2019 0000   CREATININE 4.07 (H) 05/27/2021 1119   CREATININE 3.59 (H) 03/03/2020 1454   CALCIUM 8.9 05/27/2021  1119   GFRNONAA 11 (L) 10/12/2020 0339   GFRNONAA 20 (L) 08/19/2019 1107    GFRAA 19 (L) 02/26/2020 0724   GFRAA 23 (L) 08/19/2019 1107    BNP    Component Value Date/Time   BNP 1,223.4 (H) 09/16/2020 0938    ProBNP No results found for: PROBNP  Imaging: CT Super D Chest Wo Contrast  Result Date: 05/24/2021 CLINICAL DATA:  Right upper lobe pulmonary nodule. Remote history of prostate cancer. New dyspnea. EXAM: CT CHEST WITHOUT CONTRAST TECHNIQUE: Multidetector CT imaging of the chest was performed using thin slice collimation for electromagnetic bronchoscopy planning purposes, without intravenous contrast. COMPARISON:  11/16/2020 PET-CT.  09/17/2020 chest CT. FINDINGS: Cardiovascular: Similar mild to moderate cardiomegaly. Small pericardial effusion, stable. Three-vessel coronary atherosclerosis. Atherosclerotic thoracic aorta is mildly dilated 4.0 cm ascending thoracic aorta, stable. Top-normal caliber main pulmonary artery (3.3 cm diameter). Mediastinum/Nodes: No discrete thyroid nodules. Unremarkable esophagus. No pathologically enlarged axillary, mediastinal or hilar lymph nodes, noting limited sensitivity for the detection of hilar adenopathy on this noncontrast study. Lungs/Pleura: No pneumothorax. Trace dependent bilateral pleural effusions. Mild centrilobular and paraseptal emphysema with mild diffuse bronchial wall thickening. No acute consolidative airspace disease or lung masses. Mild diffuse interlobular septal thickening in both lungs. Spiculated predominantly solid 2.4 x 1.8 cm posterior right upper lobe pulmonary nodule (series 3/image 55), stable from 2.4 x 1.8 cm on 09/17/2020 chest CT and mildly increased from 1.9 x 1.6 cm on 11/03/2016 chest CT, and clearly increased in density since 11/03/2016 chest CT. Ground-glass 1.0 x 1.0 cm peripheral left upper lobe nodule (series 3/image 74), stable since 11/03/2016 chest CT. Scattered tiny solid right lower lobe pulmonary nodules, largest 0.4 cm (series 3/image 84), stable since 11/03/2016 chest CT and considered  benign. No new significant pulmonary nodules. Upper abdomen: Right adrenal 3.7 cm nodule with density -5 HU, stable, compatible with an adenoma. Musculoskeletal: No aggressive appearing focal osseous lesions. Moderate thoracic spondylosis. IMPRESSION: 1. Spiculated predominantly solid 2.4 cm posterior right upper lobe pulmonary nodule, stable from 09/17/2020 chest CT, mildly increased from 1.9 cm on 11/03/2016 chest CT, and clearly increased in density since 11/03/2016 chest CT. This lesion demonstrated low-grade metabolic activity on 71/24/5809 PET-CT study. Low grade / indolent primary bronchogenic adenocarcinoma is the diagnosis of exclusion. Multidisciplinary thoracic oncology consultation suggested. Continued chest CT surveillance recommended at a minimum. 2. Ground-glass 1.0 cm peripheral left upper lobe nodule, stable since 11/03/2016 chest CT. 3. No thoracic adenopathy or other findings of metastatic disease in the chest. 4. Similar mild to moderate cardiomegaly. Small pericardial effusion. Trace dependent bilateral pleural effusions. Mild diffuse bilateral interlobular septal thickening in the lungs, favoring mild cardiogenic pulmonary edema. 5. Three-vessel coronary atherosclerosis. 6. Stable mildly dilated 4.0 cm ascending thoracic aorta. Recommend annual imaging followup by CTA or MRA. This recommendation follows 2010 ACCF/AHA/AATS/ACR/ASA/SCA/SCAI/SIR/STS/SVM Guidelines for the Diagnosis and Management of Patients with Thoracic Aortic Disease. Circulation. 2010; 121: X833-A250. Aortic aneurysm NOS (ICD10-I71.9). 7. Stable right adrenal adenoma. 8. Aortic Atherosclerosis (ICD10-I70.0) and Emphysema (ICD10-J43.9). Electronically Signed   By: Ilona Sorrel M.D.   On: 05/24/2021 17:53     Assessment & Plan:   Dyspnea - Patient reports increased shortness of breath with associated cough x 2 week. Suspect multifactorial d/t hx congestive heart failure, obesity and lung mass. CT chest on 05/24/21 showed no  evidence of pneumonia. Mild-moderate cardiomegaly, Small pericardial effusion. Trace dependent bilateral pleural effusion. We will check BNP and BMET. Advised he continue to take mucinex twice daily  along with Nexium daily for cough. He is maintained on Torsemide 46m twice daily + KLOR supplement for HF. We have sent in RX for albuterol hfa for him to use as needed for sob/wheezing.    Recommendations:  - Continue Mucinex 6030mtwice a day --Continue Nexium as directed  - Continue Torsemide 2053mwice a day + potassium supplement  Rx: - Albuterol 2 puffs every 4-6 hours as needed for shortness of breath/wheezing   Orders: - Labs (ordered)  Follow-up: - Due for visit with Dr. ByrLamonte Sakai April or sooner if symptoms do not improve or worsen    Pulmonary nodule 1 cm or greater in diameter - CT chest on 05/24/21 showed spiculated RUL pulmonary nodule measuring 2.4cm, mildly increased in density from 1.9cm on 11/03/16. He scheduled for PET scan on 06/14/21.  EliMartyn EhrichP 06/11/2021

## 2021-06-14 ENCOUNTER — Other Ambulatory Visit: Payer: Self-pay

## 2021-06-14 ENCOUNTER — Encounter (HOSPITAL_COMMUNITY)
Admission: RE | Admit: 2021-06-14 | Discharge: 2021-06-14 | Disposition: A | Discharge status: Home / Self Care | Payer: Medicare PPO | Source: Ambulatory Visit | Attending: Emergency Medicine | Admitting: Emergency Medicine

## 2021-06-14 DIAGNOSIS — R911 Solitary pulmonary nodule: Secondary | ICD-10-CM

## 2021-06-14 LAB — GLUCOSE, CAPILLARY: Glucose-Capillary: 109 mg/dL — ABNORMAL HIGH (ref 70–99)

## 2021-06-14 MED ORDER — FLUDEOXYGLUCOSE F - 18 (FDG) INJECTION
9.8000 | Freq: Once | INTRAVENOUS | Status: AC
  Administered 2021-06-14: 9.36 via INTRAVENOUS

## 2021-06-14 NOTE — Telephone Encounter (Signed)
Requested labs faxed to [facility].  Nothing further at this time.

## 2021-06-17 DIAGNOSIS — E113291 Type 2 diabetes mellitus with mild nonproliferative diabetic retinopathy without macular edema, right eye: Secondary | ICD-10-CM | POA: Diagnosis not present

## 2021-06-17 DIAGNOSIS — H35033 Hypertensive retinopathy, bilateral: Secondary | ICD-10-CM | POA: Diagnosis not present

## 2021-06-17 DIAGNOSIS — E113212 Type 2 diabetes mellitus with mild nonproliferative diabetic retinopathy with macular edema, left eye: Secondary | ICD-10-CM | POA: Diagnosis not present

## 2021-06-17 DIAGNOSIS — H353231 Exudative age-related macular degeneration, bilateral, with active choroidal neovascularization: Secondary | ICD-10-CM | POA: Diagnosis not present

## 2021-06-22 ENCOUNTER — Telehealth: Payer: Self-pay | Admitting: Primary Care

## 2021-06-22 NOTE — Telephone Encounter (Signed)
Please let him know that there has been minimal change in his PET scan compared with prior scans. We need to set him up for an OV with me to review and decide whether he needs further testing.

## 2021-06-22 NOTE — Telephone Encounter (Signed)
RB please advise of PET results done 10/24. Thanks :)

## 2021-06-23 NOTE — Telephone Encounter (Signed)
I have attempted to call the pt but unable to leave a VM.  Will try back later.

## 2021-06-25 NOTE — Telephone Encounter (Signed)
Patient is aware results and voiced his understanding. Appt scheduled 06/29/2021 at 4:00. Nothing further needed at this time.

## 2021-06-29 ENCOUNTER — Ambulatory Visit (INDEPENDENT_AMBULATORY_CARE_PROVIDER_SITE_OTHER): Payer: Medicare PPO | Admitting: Emergency Medicine

## 2021-06-29 ENCOUNTER — Other Ambulatory Visit: Payer: Self-pay

## 2021-06-29 ENCOUNTER — Encounter: Payer: Self-pay | Admitting: Emergency Medicine

## 2021-06-29 DIAGNOSIS — R911 Solitary pulmonary nodule: Secondary | ICD-10-CM | POA: Diagnosis not present

## 2021-06-29 NOTE — Assessment & Plan Note (Signed)
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  Discussed this with him today.  He has wanted to avoid interventions if at all possible and so we have followed conservatively.  I talked to him today about possibly doing bronchoscopy and he wants to avoid general anesthesia.  He would be willing to consider possible TTNA.  He is going to get back to me about this.  In the meantime I will schedule a repeat CT chest in 6 months.  I did explain to him that if we get a tissue diagnosis he would be a good candidate for SBRT.  We reviewed your CT scan and your PET scan today.  Both show that your right and left pulmonary nodules are overall stable in the short-term but have slowly grown when compared with prior scans going back to 2018.  There is risk that 1 or both of these could be slow-growing lung cancer. We talked today about possibly setting up a needle biopsy test to sample your right upper lobe pulmonary nodule.  Please think about whether he would like to do this and call our office to let us know if you would like for [place] to schedule it. We will plan to repeat your CT scan of the chest in April 2023 You can continue to keep albuterol available to use 2 puffs if needed for shortness of breath, chest tightness, wheezing. Follow with Dr Lamonte Sakai in April after your CT chest to review results.  Call us sooner if you have any questions or concerns.

## 2021-06-29 NOTE — Progress Notes (Signed)
Subjective:    Patient ID: Austin [name], male    DOB: 1935-03-19, 86 y.o.   MRN: [id number]  HPI  ROV 05/27/21 --86 year old man with a history of hypertension with diastolic CHF, chronic renal insufficiency, chronic cough with upper airway cough syndrome in the setting of GERD and chronic rhinitis.  He was treated briefly with prednisone taper for this in mid August but did not take it due to concern about possible side effects, GI bleeding.  We have also been following a right upper lobe pulmonary nodule, 1.1 cm groundglass left upper lobe pulmonary nodule. He describes a lot of cough and throat clearing, no mucous production. Also with intermittent dyspnea when climbing stairs - noticed over the last 2 weeks. He feels that he is accumulating LE edema. Wt has been stable. He has been experiencing restriction when sitting. No overt GERD sx on Nexium. Minimal nasal congestion. He just started some mucinex and feels that he has benfited some.   CT scan of the chest done on 05/24/2021 reviewed by me, shows 2.4 cm posterior right upper lobe pulmonary nodule that is stable compared with 09/17/2020 but which is larger and slightly more dense than prior scans 2018.  The 1.1 cm left upper lobe groundglass nodule is stable  ROV 06/29/21 --[name] is 75 and has a history of hypertension with diastolic dysfunction, chronic renal insufficiency, chronic cough with upper airway irritation syndrome, GERD, chronic rhinitis.  He has pulmonary nodular disease that we have been following with serial CT scans of the chest.  There is a 2.4 cm posterior right upper lobe pulmonary nodule, a 1.1 cm groundglass left upper lobe pulmonary nodule. He has had some intermittent exertional SOB, has noticed it w stairs. Uses albuterol about 1-2x a day, feels that he does get some benefit - clears his throat. He has wanted to defer any kind of bx of the nodules up to this point.   PET scan 06/14/2021 reviewed by me shows the 2.4 x 1.8  cm right upper lobe nodule is overall stable in size and appearance compared with most recent scans but is increased in size going back to 2018.  Minimal hypermetabolism activity.  The 1.1 cm groundglass left upper lobe nodule slightly more conspicuous.  No evidence of metastatic disease.   Review of Systems As per HPI     Objective:   Physical Exam Vitals:   06/29/21 1602  BP: 124/70  Pulse: 67  Temp: 98.5 F (36.9 C)  TempSrc: Oral  SpO2: 100%  Weight: 189 lb 9.6 oz (86 kg)  Height: 5\' 7"  (1.702 m)   Gen: Pleasant, well-nourished, in no distress,  normal affect  ENT: No lesions,  mouth clear,  oropharynx clear, no postnasal drip  Neck: No JVD, no stridor  Lungs: No use of accessory muscles, no crackles or wheezing on normal respiration, no wheeze on forced expiration  Cardiovascular: RRR, heart sounds normal, no murmur or gallops, no peripheral edema  Musculoskeletal: No deformities, no cyanosis or clubbing  Neuro: alert, awake, non focal  Skin: Warm, no lesions or rash     Assessment & Plan:  Pulmonary nodule 1 cm or greater in diameter Bilateral pulmonary nodules, 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  Discussed this with him today.  He has wanted to avoid interventions if at all possible and so we have followed conservatively.  I talked to him today about possibly doing bronchoscopy and he wants to avoid general  anesthesia.  He would be willing to consider possible TTNA.  He is going to get back to me about this.  In the meantime I will schedule a repeat CT chest in 6 months.  I did explain to him that if we get a tissue diagnosis he would be a good candidate for SBRT.  We reviewed your CT scan and your PET scan today.  Both show that your right and left pulmonary nodules are overall stable in the short-term but have slowly grown when compared with prior scans going back to 2018.  There is risk that  1 or both of these could be slow-growing lung cancer. We talked today about possibly setting up a needle biopsy test to sample your right upper lobe pulmonary nodule.  Please think about whether he would like to do this and call our office to let us know if you would like for [place] to schedule it. We will plan to repeat your CT scan of the chest in April 2023 You can continue to keep albuterol available to use 2 puffs if needed for shortness of breath, chest tightness, wheezing. Follow with Dr Lamonte Sakai in April after your CT chest to review results.  Call us sooner if you have any questions or concerns.  Baltazar Apo, MD, PhD 06/29/2021, 4:40 PM [facility] [phone] or if no answer before 7:00PM call [phone] For any issues after 7:00PM please call eLink [phone]

## 2021-06-29 NOTE — Patient Instructions (Addendum)
We reviewed your CT scan and your PET scan today.  Both show that your right and left pulmonary nodules are overall stable in the short-term but have slowly grown when compared with prior scans going back to 2018.  There is risk that 1 or both of these could be slow-growing lung cancer. We talked today about possibly setting up a needle biopsy test to sample your right upper lobe pulmonary nodule.  Please think about whether he would like to do this and call our office to let us know if you would like for [place] to schedule it. We will plan to repeat your CT scan of the chest in April 2023 You can continue to keep albuterol available to use 2 puffs if needed for shortness of breath, chest tightness, wheezing. Follow with Dr Lamonte Sakai in April after your CT chest to review results.  Call us sooner if you have any questions or concerns.

## 2021-06-29 NOTE — Addendum Note (Signed)
Addended by: Gavin Potters R on: 06/29/2021 04:56 PM   Modules accepted: Orders

## 2021-06-30 ENCOUNTER — Encounter: Payer: Self-pay | Admitting: Podiatry

## 2021-06-30 ENCOUNTER — Ambulatory Visit (INDEPENDENT_AMBULATORY_CARE_PROVIDER_SITE_OTHER): Payer: Medicare PPO | Admitting: Podiatry

## 2021-06-30 DIAGNOSIS — E0821 Diabetes mellitus due to underlying condition with diabetic nephropathy: Secondary | ICD-10-CM

## 2021-06-30 DIAGNOSIS — Q828 Other specified congenital malformations of skin: Secondary | ICD-10-CM

## 2021-06-30 NOTE — Progress Notes (Signed)
This patient returns to my office for at risk foot care.  This patient requires this care by a professional since this patient will be at risk due to having diabetic neuropathy and CKD   This patient is unable to cut nails himself since the patient cannot reach his nails.These nails are painful walking and wearing shoes.  This patient presents for at risk foot care today.  General Appearance  Alert, conversant and in no acute stress.  Vascular  Dorsalis pedis and posterior tibial  pulses are palpable  bilaterally.  Capillary return is within normal limits  bilaterally. Temperature is within normal limits  bilaterally.  Neurologic  Senn-Weinstein monofilament wire test diminished bilaterally. Muscle power within normal limits bilaterally.  Nails Thick disfigured discolored nails with subungual debris  from hallux to fifth toes bilaterally. No evidence of bacterial infection or drainage bilaterally.  Orthopedic  No limitations of motion  feet .  No crepitus or effusions noted.  No bony pathology or digital deformities noted.  HAV  B/L  Hammer toes  B/L.  Skin  normotropic skin with noted bilaterally.  No signs of infections or ulcers noted.   Porokeratosis sub 3,4 left foot. Porokeratosis medial aspect 1st MPJ right foot.  Porokeratosis left forefoot.  Consent was obtained for treatment procedures.   Debride porokeratosis with # 15 blade.   Return office visit    3 months                 Told patient to return for periodic foot care and evaluation due to potential at risk complications.   Gardiner Barefoot DPM

## 2021-07-07 DIAGNOSIS — N189 Chronic kidney disease, unspecified: Secondary | ICD-10-CM | POA: Diagnosis not present

## 2021-07-07 DIAGNOSIS — N184 Chronic kidney disease, stage 4 (severe): Secondary | ICD-10-CM | POA: Diagnosis not present

## 2021-07-08 ENCOUNTER — Encounter (HOSPITAL_COMMUNITY): Payer: Self-pay | Admitting: Emergency Medicine

## 2021-07-08 ENCOUNTER — Other Ambulatory Visit: Payer: Self-pay

## 2021-07-08 ENCOUNTER — Inpatient Hospital Stay (HOSPITAL_COMMUNITY)
Admission: EM | Admit: 2021-07-08 | Discharge: 2021-07-14 | DRG: 291 | Disposition: A | Discharge status: Home Health | Payer: Medicare PPO | Attending: Family Medicine | Admitting: Family Medicine

## 2021-07-08 DIAGNOSIS — R110 Nausea: Secondary | ICD-10-CM | POA: Diagnosis not present

## 2021-07-08 DIAGNOSIS — Z9049 Acquired absence of other specified parts of digestive tract: Secondary | ICD-10-CM

## 2021-07-08 DIAGNOSIS — I5031 Acute diastolic (congestive) heart failure: Secondary | ICD-10-CM | POA: Diagnosis not present

## 2021-07-08 DIAGNOSIS — Z8249 Family history of ischemic heart disease and other diseases of the circulatory system: Secondary | ICD-10-CM | POA: Diagnosis not present

## 2021-07-08 DIAGNOSIS — Z79899 Other long term (current) drug therapy: Secondary | ICD-10-CM | POA: Diagnosis not present

## 2021-07-08 DIAGNOSIS — E1122 Type 2 diabetes mellitus with diabetic chronic kidney disease: Secondary | ICD-10-CM | POA: Diagnosis present

## 2021-07-08 DIAGNOSIS — R0902 Hypoxemia: Secondary | ICD-10-CM

## 2021-07-08 DIAGNOSIS — N189 Chronic kidney disease, unspecified: Secondary | ICD-10-CM | POA: Diagnosis present

## 2021-07-08 DIAGNOSIS — R778 Other specified abnormalities of plasma proteins: Secondary | ICD-10-CM | POA: Diagnosis present

## 2021-07-08 DIAGNOSIS — I517 Cardiomegaly: Secondary | ICD-10-CM | POA: Diagnosis not present

## 2021-07-08 DIAGNOSIS — I110 Hypertensive heart disease with heart failure: Secondary | ICD-10-CM | POA: Diagnosis not present

## 2021-07-08 DIAGNOSIS — R911 Solitary pulmonary nodule: Secondary | ICD-10-CM | POA: Diagnosis present

## 2021-07-08 DIAGNOSIS — E876 Hypokalemia: Secondary | ICD-10-CM | POA: Diagnosis present

## 2021-07-08 DIAGNOSIS — Z8546 Personal history of malignant neoplasm of prostate: Secondary | ICD-10-CM

## 2021-07-08 DIAGNOSIS — Z833 Family history of diabetes mellitus: Secondary | ICD-10-CM | POA: Diagnosis not present

## 2021-07-08 DIAGNOSIS — Z7984 Long term (current) use of oral hypoglycemic drugs: Secondary | ICD-10-CM | POA: Diagnosis not present

## 2021-07-08 DIAGNOSIS — N179 Acute kidney failure, unspecified: Secondary | ICD-10-CM | POA: Diagnosis present

## 2021-07-08 DIAGNOSIS — I161 Hypertensive emergency: Secondary | ICD-10-CM | POA: Diagnosis present

## 2021-07-08 DIAGNOSIS — E1169 Type 2 diabetes mellitus with other specified complication: Secondary | ICD-10-CM | POA: Diagnosis present

## 2021-07-08 DIAGNOSIS — E1129 Type 2 diabetes mellitus with other diabetic kidney complication: Secondary | ICD-10-CM | POA: Diagnosis not present

## 2021-07-08 DIAGNOSIS — R7989 Other specified abnormal findings of blood chemistry: Secondary | ICD-10-CM | POA: Diagnosis present

## 2021-07-08 DIAGNOSIS — R509 Fever, unspecified: Secondary | ICD-10-CM | POA: Diagnosis not present

## 2021-07-08 DIAGNOSIS — Z87891 Personal history of nicotine dependence: Secondary | ICD-10-CM

## 2021-07-08 DIAGNOSIS — R918 Other nonspecific abnormal finding of lung field: Secondary | ICD-10-CM | POA: Diagnosis present

## 2021-07-08 DIAGNOSIS — I160 Hypertensive urgency: Secondary | ICD-10-CM | POA: Diagnosis not present

## 2021-07-08 DIAGNOSIS — Z886 Allergy status to analgesic agent status: Secondary | ICD-10-CM

## 2021-07-08 DIAGNOSIS — D72829 Elevated white blood cell count, unspecified: Secondary | ICD-10-CM | POA: Diagnosis present

## 2021-07-08 DIAGNOSIS — Z800 Family history of malignant neoplasm of digestive organs: Secondary | ICD-10-CM

## 2021-07-08 DIAGNOSIS — J810 Acute pulmonary edema: Secondary | ICD-10-CM | POA: Diagnosis not present

## 2021-07-08 DIAGNOSIS — N185 Chronic kidney disease, stage 5: Secondary | ICD-10-CM | POA: Diagnosis present

## 2021-07-08 DIAGNOSIS — D649 Anemia, unspecified: Secondary | ICD-10-CM | POA: Diagnosis not present

## 2021-07-08 DIAGNOSIS — K5900 Constipation, unspecified: Secondary | ICD-10-CM | POA: Diagnosis present

## 2021-07-08 DIAGNOSIS — D631 Anemia in chronic kidney disease: Secondary | ICD-10-CM | POA: Diagnosis present

## 2021-07-08 DIAGNOSIS — J9601 Acute respiratory failure with hypoxia: Secondary | ICD-10-CM | POA: Diagnosis present

## 2021-07-08 DIAGNOSIS — R059 Cough, unspecified: Secondary | ICD-10-CM | POA: Diagnosis not present

## 2021-07-08 DIAGNOSIS — E8779 Other fluid overload: Secondary | ICD-10-CM | POA: Diagnosis not present

## 2021-07-08 DIAGNOSIS — Z7902 Long term (current) use of antithrombotics/antiplatelets: Secondary | ICD-10-CM

## 2021-07-08 DIAGNOSIS — I120 Hypertensive chronic kidney disease with stage 5 chronic kidney disease or end stage renal disease: Secondary | ICD-10-CM | POA: Diagnosis not present

## 2021-07-08 DIAGNOSIS — I5033 Acute on chronic diastolic (congestive) heart failure: Secondary | ICD-10-CM | POA: Diagnosis present

## 2021-07-08 DIAGNOSIS — R0602 Shortness of breath: Secondary | ICD-10-CM | POA: Diagnosis not present

## 2021-07-08 DIAGNOSIS — Z20822 Contact with and (suspected) exposure to covid-19: Secondary | ICD-10-CM | POA: Diagnosis present

## 2021-07-08 DIAGNOSIS — E785 Hyperlipidemia, unspecified: Secondary | ICD-10-CM | POA: Diagnosis present

## 2021-07-08 DIAGNOSIS — I132 Hypertensive heart and chronic kidney disease with heart failure and with stage 5 chronic kidney disease, or end stage renal disease: Secondary | ICD-10-CM | POA: Diagnosis present

## 2021-07-08 DIAGNOSIS — R053 Chronic cough: Secondary | ICD-10-CM | POA: Diagnosis present

## 2021-07-08 NOTE — ED Provider Notes (Signed)
Emergency Medicine Provider Triage Evaluation Note  Austin [name] , a 86 y.o. male  was evaluated in triage.  Pt complains of SOB for the past few days, worse tonight.  Does report cough and some increased LE swelling.  Has known cardiac history with CHF.  Followed by cardiology, Dr. Terrence Dupont.  Has been taking his demedex as prescribed  Review of Systems  Positive: SOB, cough, leg swelling Negative: Chest pain  Physical Exam  BP (!) 209/95 (BP Location: Left Arm)   Pulse (!) 109   Temp 99.5 F (37.5 C)   Resp 17   SpO2 91%   Gen:   Awake, no distress   Resp:  Normal effort, appears winded with conversation MSK:   Moves extremities without difficulty  Other:  Swelling at ankles, some mild JVD noted  Medical Decision Making  Medically screening exam initiated at 11:52 PM.  Appropriate orders placed.  Austin [name] was informed that the remainder of the evaluation will be completed by another provider, this initial triage assessment does not replace that evaluation, and the importance of remaining in the ED until their evaluation is complete.  SOB for a few days.  He is very hypertensive and hypoxic in triage.  Concern for CHF.  EKG, labs, CXR, covid/flu screen sent.  Charge RN aware, going to next available room.   Larene Pickett, PA-C 07/08/21 2357    [name], MD 07/09/21 [phone]

## 2021-07-08 NOTE — ED Triage Notes (Signed)
Patient reports worsening SOB with productive cough and bilateral legs swelling for several days , history of CHF his cardiologist is Dr. Terrence Dupont.

## 2021-07-09 ENCOUNTER — Other Ambulatory Visit: Payer: Self-pay | Admitting: Adult Health

## 2021-07-09 ENCOUNTER — Other Ambulatory Visit (HOSPITAL_COMMUNITY): Payer: Medicare PPO

## 2021-07-09 ENCOUNTER — Emergency Department (HOSPITAL_COMMUNITY): Payer: Medicare PPO

## 2021-07-09 ENCOUNTER — Encounter (HOSPITAL_COMMUNITY): Payer: Self-pay | Admitting: Family Medicine

## 2021-07-09 DIAGNOSIS — I161 Hypertensive emergency: Secondary | ICD-10-CM | POA: Diagnosis present

## 2021-07-09 DIAGNOSIS — Z7902 Long term (current) use of antithrombotics/antiplatelets: Secondary | ICD-10-CM | POA: Diagnosis not present

## 2021-07-09 DIAGNOSIS — K5900 Constipation, unspecified: Secondary | ICD-10-CM | POA: Diagnosis present

## 2021-07-09 DIAGNOSIS — Z800 Family history of malignant neoplasm of digestive organs: Secondary | ICD-10-CM | POA: Diagnosis not present

## 2021-07-09 DIAGNOSIS — I5031 Acute diastolic (congestive) heart failure: Secondary | ICD-10-CM | POA: Diagnosis not present

## 2021-07-09 DIAGNOSIS — N185 Chronic kidney disease, stage 5: Secondary | ICD-10-CM

## 2021-07-09 DIAGNOSIS — E876 Hypokalemia: Secondary | ICD-10-CM

## 2021-07-09 DIAGNOSIS — I5033 Acute on chronic diastolic (congestive) heart failure: Secondary | ICD-10-CM | POA: Diagnosis present

## 2021-07-09 DIAGNOSIS — I160 Hypertensive urgency: Secondary | ICD-10-CM

## 2021-07-09 DIAGNOSIS — J9601 Acute respiratory failure with hypoxia: Secondary | ICD-10-CM | POA: Diagnosis present

## 2021-07-09 DIAGNOSIS — Z79899 Other long term (current) drug therapy: Secondary | ICD-10-CM | POA: Diagnosis not present

## 2021-07-09 DIAGNOSIS — D72829 Elevated white blood cell count, unspecified: Secondary | ICD-10-CM | POA: Diagnosis present

## 2021-07-09 DIAGNOSIS — R509 Fever, unspecified: Secondary | ICD-10-CM | POA: Diagnosis not present

## 2021-07-09 DIAGNOSIS — Z8249 Family history of ischemic heart disease and other diseases of the circulatory system: Secondary | ICD-10-CM | POA: Diagnosis not present

## 2021-07-09 DIAGNOSIS — Z833 Family history of diabetes mellitus: Secondary | ICD-10-CM | POA: Diagnosis not present

## 2021-07-09 DIAGNOSIS — D631 Anemia in chronic kidney disease: Secondary | ICD-10-CM | POA: Diagnosis present

## 2021-07-09 DIAGNOSIS — N179 Acute kidney failure, unspecified: Secondary | ICD-10-CM | POA: Diagnosis present

## 2021-07-09 DIAGNOSIS — R778 Other specified abnormalities of plasma proteins: Secondary | ICD-10-CM

## 2021-07-09 DIAGNOSIS — R0902 Hypoxemia: Secondary | ICD-10-CM | POA: Diagnosis present

## 2021-07-09 DIAGNOSIS — E1122 Type 2 diabetes mellitus with diabetic chronic kidney disease: Secondary | ICD-10-CM | POA: Diagnosis present

## 2021-07-09 DIAGNOSIS — I132 Hypertensive heart and chronic kidney disease with heart failure and with stage 5 chronic kidney disease, or end stage renal disease: Secondary | ICD-10-CM | POA: Diagnosis present

## 2021-07-09 DIAGNOSIS — Z87891 Personal history of nicotine dependence: Secondary | ICD-10-CM | POA: Diagnosis not present

## 2021-07-09 DIAGNOSIS — Z7984 Long term (current) use of oral hypoglycemic drugs: Secondary | ICD-10-CM | POA: Diagnosis not present

## 2021-07-09 DIAGNOSIS — Z8546 Personal history of malignant neoplasm of prostate: Secondary | ICD-10-CM | POA: Diagnosis not present

## 2021-07-09 DIAGNOSIS — R911 Solitary pulmonary nodule: Secondary | ICD-10-CM

## 2021-07-09 DIAGNOSIS — Z20822 Contact with and (suspected) exposure to covid-19: Secondary | ICD-10-CM | POA: Diagnosis present

## 2021-07-09 DIAGNOSIS — R110 Nausea: Secondary | ICD-10-CM | POA: Diagnosis not present

## 2021-07-09 DIAGNOSIS — Z886 Allergy status to analgesic agent status: Secondary | ICD-10-CM | POA: Diagnosis not present

## 2021-07-09 LAB — BASIC METABOLIC PANEL
Anion gap: 10 (ref 5–15)
Anion gap: 13 (ref 5–15)
BUN: 45 mg/dL — ABNORMAL HIGH (ref 8–23)
BUN: 46 mg/dL — ABNORMAL HIGH (ref 8–23)
CO2: 18 mmol/L — ABNORMAL LOW (ref 22–32)
CO2: 19 mmol/L — ABNORMAL LOW (ref 22–32)
Calcium: 8.7 mg/dL — ABNORMAL LOW (ref 8.9–10.3)
Calcium: 9 mg/dL (ref 8.9–10.3)
Chloride: 113 mmol/L — ABNORMAL HIGH (ref 98–111)
Chloride: 114 mmol/L — ABNORMAL HIGH (ref 98–111)
Creatinine, Ser: 4.02 mg/dL — ABNORMAL HIGH (ref 0.61–1.24)
Creatinine, Ser: 4.04 mg/dL — ABNORMAL HIGH (ref 0.61–1.24)
GFR, Estimated: 14 mL/min — ABNORMAL LOW (ref >60)
GFR, Estimated: 14 mL/min — ABNORMAL LOW (ref >60)
Glucose, Bld: 162 mg/dL — ABNORMAL HIGH (ref 70–99)
Glucose, Bld: 192 mg/dL — ABNORMAL HIGH (ref 70–99)
Potassium: 3.3 mmol/L — ABNORMAL LOW (ref 3.5–5.1)
Potassium: 3.3 mmol/L — ABNORMAL LOW (ref 3.5–5.1)
Sodium: 143 mmol/L (ref 135–145)
Sodium: 144 mmol/L (ref 135–145)

## 2021-07-09 LAB — HEMOGLOBIN A1C
Hgb A1c MFr Bld: 6.3 % — ABNORMAL HIGH (ref 4.8–5.6)
Mean Plasma Glucose: 134.11 mg/dL

## 2021-07-09 LAB — CBC WITH DIFFERENTIAL/PLATELET
Abs Immature Granulocytes: 0.04 10*3/uL (ref 0.00–0.07)
Basophils Absolute: 0.1 10*3/uL (ref 0.0–0.1)
Basophils Relative: 0 %
Eosinophils Absolute: 0.1 10*3/uL (ref 0.0–0.5)
Eosinophils Relative: 1 %
HCT: 28.6 % — ABNORMAL LOW (ref 39.0–52.0)
Hemoglobin: 9 g/dL — ABNORMAL LOW (ref 13.0–17.0)
Immature Granulocytes: 0 %
Lymphocytes Relative: 6 %
Lymphs Abs: 0.7 10*3/uL (ref 0.7–4.0)
MCH: 26.2 pg (ref 26.0–34.0)
MCHC: 31.5 g/dL (ref 30.0–36.0)
MCV: 83.4 fL (ref 80.0–100.0)
Monocytes Absolute: 0.6 10*3/uL (ref 0.1–1.0)
Monocytes Relative: 5 %
Neutro Abs: 10.6 10*3/uL — ABNORMAL HIGH (ref 1.7–7.7)
Neutrophils Relative %: 88 %
Platelets: 297 10*3/uL (ref 150–400)
RBC: 3.43 MIL/uL — ABNORMAL LOW (ref 4.22–5.81)
RDW: 15.3 % (ref 11.5–15.5)
WBC: 12 10*3/uL — ABNORMAL HIGH (ref 4.0–10.5)
nRBC: 0 % (ref 0.0–0.2)

## 2021-07-09 LAB — RESP PANEL BY RT-PCR (FLU A&B, COVID) ARPGX2
Influenza A by PCR: NEGATIVE
Influenza B by PCR: NEGATIVE
SARS Coronavirus 2 by RT PCR: NEGATIVE

## 2021-07-09 LAB — CBC
HCT: 25 % — ABNORMAL LOW (ref 39.0–52.0)
Hemoglobin: 7.9 g/dL — ABNORMAL LOW (ref 13.0–17.0)
MCH: 26.3 pg (ref 26.0–34.0)
MCHC: 31.6 g/dL (ref 30.0–36.0)
MCV: 83.3 fL (ref 80.0–100.0)
Platelets: 270 10*3/uL (ref 150–400)
RBC: 3 MIL/uL — ABNORMAL LOW (ref 4.22–5.81)
RDW: 15.2 % (ref 11.5–15.5)
WBC: 9.2 10*3/uL (ref 4.0–10.5)
nRBC: 0 % (ref 0.0–0.2)

## 2021-07-09 LAB — TROPONIN I (HIGH SENSITIVITY)
Troponin I (High Sensitivity): 116 ng/L (ref <18)
Troponin I (High Sensitivity): 49 ng/L — ABNORMAL HIGH (ref <18)

## 2021-07-09 LAB — GLUCOSE, CAPILLARY
Glucose-Capillary: 143 mg/dL — ABNORMAL HIGH (ref 70–99)
Glucose-Capillary: 161 mg/dL — ABNORMAL HIGH (ref 70–99)

## 2021-07-09 LAB — CBG MONITORING, ED
Glucose-Capillary: 119 mg/dL — ABNORMAL HIGH (ref 70–99)
Glucose-Capillary: 143 mg/dL — ABNORMAL HIGH (ref 70–99)

## 2021-07-09 LAB — MAGNESIUM: Magnesium: 2 mg/dL (ref 1.7–2.4)

## 2021-07-09 LAB — BRAIN NATRIURETIC PEPTIDE: B Natriuretic Peptide: 2229.6 pg/mL — ABNORMAL HIGH (ref 0.0–100.0)

## 2021-07-09 MED ORDER — SODIUM CHLORIDE 0.9% FLUSH: 3.0000 mL | INTRAVENOUS | Status: DC | PRN

## 2021-07-09 MED ORDER — HYDROCORTISONE (PERIANAL) 2.5 % EX CREA
1.0000 "application " | TOPICAL_CREAM | Freq: Two times a day (BID) | CUTANEOUS | Status: DC
  Administered 2021-07-09 – 2021-07-14 (×9): 1 via RECTAL
  Filled 2021-07-09: qty 28.35

## 2021-07-09 MED ORDER — ONDANSETRON HCL 4 MG/2ML IJ SOLN: 4.0000 mg | Freq: Four times a day (QID) | INTRAMUSCULAR | Status: DC | PRN

## 2021-07-09 MED ORDER — FUROSEMIDE 10 MG/ML IJ SOLN
80.0000 mg | Freq: Once | INTRAMUSCULAR | Status: AC
  Administered 2021-07-09: 80 mg via INTRAVENOUS
  Filled 2021-07-09: qty 8

## 2021-07-09 MED ORDER — INSULIN ASPART 100 UNIT/ML IJ SOLN
0.0000 [IU] | Freq: Three times a day (TID) | INTRAMUSCULAR | Status: DC
  Administered 2021-07-10 – 2021-07-12 (×2): 1 [IU] via SUBCUTANEOUS
  Administered 2021-07-13: 2 [IU] via SUBCUTANEOUS
  Administered 2021-07-14: 1 [IU] via SUBCUTANEOUS

## 2021-07-09 MED ORDER — HEPARIN SODIUM (PORCINE) 5000 UNIT/ML IJ SOLN
5000.0000 [IU] | Freq: Three times a day (TID) | INTRAMUSCULAR | Status: DC
  Administered 2021-07-09 – 2021-07-14 (×14): 5000 [IU] via SUBCUTANEOUS
  Filled 2021-07-09 (×15): qty 1

## 2021-07-09 MED ORDER — AMLODIPINE BESYLATE 10 MG PO TABS
10.0000 mg | ORAL_TABLET | Freq: Every day | ORAL | Status: DC
  Administered 2021-07-09 – 2021-07-14 (×6): 10 mg via ORAL
  Filled 2021-07-09: qty 2
  Filled 2021-07-09 (×5): qty 1

## 2021-07-09 MED ORDER — SODIUM CHLORIDE 0.9% FLUSH
3.0000 mL | Freq: Two times a day (BID) | INTRAVENOUS | Status: DC
  Administered 2021-07-09 – 2021-07-14 (×11): 3 mL via INTRAVENOUS

## 2021-07-09 MED ORDER — PANTOPRAZOLE SODIUM 40 MG PO TBEC
40.0000 mg | DELAYED_RELEASE_TABLET | Freq: Every day | ORAL | Status: DC
  Administered 2021-07-09 – 2021-07-14 (×6): 40 mg via ORAL
  Filled 2021-07-09 (×6): qty 1

## 2021-07-09 MED ORDER — ATORVASTATIN CALCIUM 10 MG PO TABS
20.0000 mg | ORAL_TABLET | Freq: Every day | ORAL | Status: DC
  Administered 2021-07-09 – 2021-07-14 (×6): 20 mg via ORAL
  Filled 2021-07-09 (×6): qty 2

## 2021-07-09 MED ORDER — SODIUM CHLORIDE 0.9 % IV SOLN: 250.0000 mL | INTRAVENOUS | Status: DC | PRN

## 2021-07-09 MED ORDER — POTASSIUM CHLORIDE CRYS ER 20 MEQ PO TBCR
20.0000 meq | EXTENDED_RELEASE_TABLET | Freq: Once | ORAL | Status: DC
  Filled 2021-07-09: qty 1

## 2021-07-09 MED ORDER — METOPROLOL SUCCINATE ER 100 MG PO TB24
100.0000 mg | ORAL_TABLET | Freq: Every day | ORAL | Status: DC
  Administered 2021-07-09 – 2021-07-14 (×6): 100 mg via ORAL
  Filled 2021-07-09: qty 4
  Filled 2021-07-09 (×5): qty 1

## 2021-07-09 MED ORDER — HYDRALAZINE HCL 25 MG PO TABS: 25.0000 mg | ORAL_TABLET | Freq: Four times a day (QID) | ORAL | Status: DC | PRN

## 2021-07-09 MED ORDER — ACETAMINOPHEN 325 MG PO TABS: 650.0000 mg | ORAL_TABLET | ORAL | Status: DC | PRN

## 2021-07-09 MED ORDER — POTASSIUM CHLORIDE CRYS ER 10 MEQ PO TBCR
10.0000 meq | EXTENDED_RELEASE_TABLET | Freq: Two times a day (BID) | ORAL | Status: DC
  Administered 2021-07-09 – 2021-07-12 (×8): 10 meq via ORAL
  Filled 2021-07-09 (×8): qty 1

## 2021-07-09 MED ORDER — ISOSORB DINITRATE-HYDRALAZINE 20-37.5 MG PO TABS
1.0000 | ORAL_TABLET | Freq: Three times a day (TID) | ORAL | Status: DC
Start: 2021-07-09 — End: 2021-07-14
  Administered 2021-07-09 – 2021-07-14 (×16): 1 via ORAL
  Filled 2021-07-09 (×19): qty 1

## 2021-07-09 MED ORDER — CLOPIDOGREL BISULFATE 75 MG PO TABS
75.0000 mg | ORAL_TABLET | Freq: Every day | ORAL | Status: DC
  Administered 2021-07-09 – 2021-07-13 (×5): 75 mg via ORAL
  Filled 2021-07-09 (×5): qty 1

## 2021-07-09 MED ORDER — CLONIDINE HCL 0.1 MG PO TABS
0.3000 mg | ORAL_TABLET | Freq: Three times a day (TID) | ORAL | Status: DC
  Administered 2021-07-09 – 2021-07-14 (×16): 0.3 mg via ORAL
  Filled 2021-07-09 (×16): qty 3

## 2021-07-09 MED ORDER — FUROSEMIDE 10 MG/ML IJ SOLN
60.0000 mg | Freq: Two times a day (BID) | INTRAMUSCULAR | Status: DC
  Administered 2021-07-09 – 2021-07-12 (×8): 60 mg via INTRAVENOUS
  Filled 2021-07-09 (×8): qty 6

## 2021-07-09 MED ORDER — ALBUTEROL SULFATE (2.5 MG/3ML) 0.083% IN NEBU
2.5000 mg | INHALATION_SOLUTION | Freq: Four times a day (QID) | RESPIRATORY_TRACT | Status: DC | PRN
  Filled 2021-07-09: qty 3

## 2021-07-09 MED ORDER — NITROGLYCERIN 2 % TD OINT
1.0000 [in_us] | TOPICAL_OINTMENT | Freq: Once | TRANSDERMAL | Status: AC
  Administered 2021-07-09: 1 [in_us] via TOPICAL
  Filled 2021-07-09: qty 1

## 2021-07-09 MED ORDER — CLONIDINE HCL 0.2 MG PO TABS: 0.3000 mg | ORAL_TABLET | Freq: Three times a day (TID) | ORAL | Status: DC

## 2021-07-09 MED ORDER — DOXAZOSIN MESYLATE 2 MG PO TABS
2.0000 mg | ORAL_TABLET | Freq: Every day | ORAL | Status: DC
  Administered 2021-07-09 – 2021-07-13 (×5): 2 mg via ORAL
  Filled 2021-07-09 (×6): qty 1

## 2021-07-09 MED ORDER — ISOSORB DINITRATE-HYDRALAZINE 20-37.5 MG PO TABS: 1.0000 | ORAL_TABLET | Freq: Three times a day (TID) | ORAL | Status: DC

## 2021-07-09 NOTE — Progress Notes (Signed)
Patient admitted after midnight, please see H&P.  Here with SOB from suspected diastolic CHF.  Per patient he has been urinating well.  No labs from this AM to see how kidneys are responding.  In chart review it appears there is consideration given to starting PD so may need renal consult if not able to diurese.  Patient is on O2 asking to go back on Bipap.  Does have increased work of breathing with speaking.  Eulogio Bear DO

## 2021-07-09 NOTE — ED Notes (Signed)
Pt requested to be taken off of BIPAP. RN removed and replaced it with 2L [place]. Pt O2 96%.

## 2021-07-09 NOTE — H&P (Signed)
History and Physical    Austin [name] KKX:[id number] DOB: 05/13/1935 DOA: 07/08/2021  PCP: Dorothyann Peng, NP   Patient coming from: Home   Chief Complaint: SOB   HPI: Austin [name] is a pleasant 86 y.o. male with medical history significant for type 2 diabetes mellitus, resistant hypertension, CKD IV or V, CHF, chronic cough, and pulmonary nodule, now presenting to the emergency department for evaluation of shortness of breath.  Patient reports a few days of progressive dyspnea and increased bilateral leg swelling despite recent increase in torsemide.  Legs are now swollen above the knees and he also feels some swelling in his lower abdomen now.  Shortness of breath is worse at night.  He denies any dietary indiscretion but may have been drinking more fluids due to dry mouth which she attributes to increased torsemide.  He denies chest pain, fevers, or chills.  ED Course: Upon arrival to the ED, patient is found to be afebrile, saturating 91% on room air initially, later 89% on 4 L, slightly tachycardic, and with blood pressure as high as 209/95.  EKG features sinus tachycardia with rate 106 and first-degree AV nodal block.  Chest x-ray with cardiomegaly, vascular congestion, and edema.  Chemistry panel notable for potassium 3.3, bicarbonate 18, and creatinine 4.02.  CBC with leukocytosis to 12,000 and hemoglobin 9.0.  BNP is 2230 and troponin 49.  COVID and influenza negative.  80 mg IV Lasix and 1 inch nitroglycerin ointment were ordered and hospitalists asked to admit.  Review of Systems:  All other systems reviewed and apart from HPI, are negative.  Past Medical History:  Diagnosis Date   ANEMIA DUE TO CHRONIC BLOOD LOSS 03/13/2007   CAROTID ARTERY STENOSIS 05/10/2010   CHF (congestive heart failure) ([place])    DIABETES MELLITUS, TYPE II 09/19/2007   DISEASE, CEREBROVASCULAR NEC 03/05/2007   GERD 03/13/2007   HYPERLIPIDEMIA 03/05/2007   HYPERTENSION 03/05/2007   HYPOKALEMIA 11/09/2009   KNEE PAIN,  RIGHT 11/09/2009   PROSTATE CANCER, HX OF 03/05/2007   RENAL DISEASE, CHRONIC 02/03/2009    Past Surgical History:  Procedure Laterality Date   CAROTID ARTERY ANGIOPLASTY Right Oct. 10, 2001   ESOPHAGOGASTRODUODENOSCOPY (EGD) WITH PROPOFOL N/A 11/04/2016   Procedure: ESOPHAGOGASTRODUODENOSCOPY (EGD) WITH PROPOFOL;  Surgeon: Otis Brace, MD;  Location: [facility];  Service: Gastroenterology;  Laterality: N/A;   LAPAROSCOPIC APPENDECTOMY  02/20/2012   Procedure: APPENDECTOMY LAPAROSCOPIC;  Surgeon: Stark Klein, MD;  Location: [facility];  Service: General;  Laterality: N/A;   PROSTATE SURGERY     prostatectomy    Social History:   reports that he quit smoking about 46 years ago. His smoking use included cigarettes. He has a 10.00 pack-year smoking history. He has never used smokeless tobacco. He reports that he does not drink alcohol and does not use drugs.  Allergies  Allergen Reactions   Aspirin Other (See Comments)    High doses causes stomach ulcer and bleeding    Family History  Problem Relation Age of Onset   Hypertension Mother    Cancer Father        Mesothelioma    Stomach cancer Brother    Cancer Brother    Cancer - Cervical Brother    Cancer Brother    Diabetes Brother    Esophageal cancer Neg Hx    Colon cancer Neg Hx    Pancreatic cancer Neg Hx      Prior to Admission medications   Medication Sig Start Date End Date Taking? Authorizing Provider  ACCU-CHEK GUIDE test strip USED TO CHECK BLOOD GLUCOSE TWICE A DAY OR AS NEEDED 10/20/20   Nafziger, Tommi Rumps, NP  Accu-Chek Softclix Lancets lancets USED TO CHECK BLOOD GLUCOSE TWICE A DAY OR AS NEEDED 12/25/20   Nafziger, Tommi Rumps, NP  acetaminophen (TYLENOL) 325 MG tablet Take 325 mg by mouth 4 (four) times daily.    [provider]  albuterol (VENTOLIN HFA) 108 (90 Base) MCG/ACT inhaler Inhale 2 puffs into the lungs every 6 (six) hours as needed for wheezing or shortness of breath. 06/11/21   Martyn Ehrich, NP   amLODipine (NORVASC) 10 MG tablet TAKE 1 TABLET BY MOUTH EVERY DAY 02/12/21   Nafziger, Tommi Rumps, NP  atorvastatin (LIPITOR) 20 MG tablet TAKE 1 TABLET BY MOUTH EVERY DAY 12/29/20   Nafziger, Tommi Rumps, NP  Blood Glucose Monitoring Suppl (ACCU-CHEK AVIVA PLUS) w/Device KIT Used to check blood glucose 2 times a day or PRN 09/04/19   Nafziger, Tommi Rumps, NP  cholecalciferol (VITAMIN D3) 25 MCG (1000 UNIT) tablet Take 1,000 Units by mouth once a week.    [provider]  cloNIDine (CATAPRES) 0.3 MG tablet Take 1 tablet (0.3 mg total) by mouth 3 (three) times daily. 09/20/20   Harold Hedge, MD  clopidogrel (PLAVIX) 75 MG tablet TAKE 1 TABLET BY MOUTH EVERY DAY Patient taking differently: Take 75 mg by mouth daily with supper. 05/20/20   Nafziger, Tommi Rumps, NP  doxazosin (CARDURA) 2 MG tablet Take 2 mg by mouth at bedtime.  12/28/17   [provider]  esomeprazole (NEXIUM) 40 MG capsule TAKE 1 CAPSULE BY MOUTH EVERY DAY 05/13/21   Nafziger, Tommi Rumps, NP  glipiZIDE (GLUCOTROL XL) 5 MG 24 hr tablet TAKE 1 TABLET BY MOUTH EVERY DAY 05/28/21   Nafziger, Tommi Rumps, NP  hydrocortisone (ANUSOL-HC) 2.5 % rectal cream PLACE 1 APPLICATION RECTALLY 2 TIMES A DAY. 03/31/21   Burchette, Alinda Sierras, MD  isosorbide-hydrALAZINE (BIDIL) 20-37.5 MG tablet Take 1 tablet by mouth 3 (three) times daily.    [provider]  KLOR-CON M10 10 MEQ tablet Take 2 tablets (20 mEq total) by mouth 2 (two) times daily. Patient taking differently: Take 10 mEq by mouth 2 (two) times daily. 09/20/20 10/20/20  Harold Hedge, MD  lactulose ([place]) 10 GM/15ML solution TAKE 15 ML BY MOUTH 2 TIMES DAILY AS NEEDED FOR MILD CONSTIPATION. 03/18/21   Nafziger, Tommi Rumps, NP  metoprolol succinate (TOPROL-XL) 100 MG 24 hr tablet Take 1 tablet (100 mg total) by mouth daily. Take with or immediately following a meal. 09/21/20 10/21/20  Harold Hedge, MD  polyethylene glycol (MIRALAX / GLYCOLAX) 17 g packet Take 17 g by mouth daily as needed for mild constipation.  10/12/20   Oswald Hillock, MD  predniSONE (DELTASONE) 10 MG tablet Take 4 tabs by mouth daily x 2 days; then 2 tabs by mouth daily x 2 days; then 1 tab by mouth daily x 2 days 04/06/21   Martyn Ehrich, NP  Propylene Glycol (SYSTANE BALANCE OP) Place 1 drop into both eyes daily as needed (dry eyes).    [provider]  saccharomyces boulardii (FLORASTOR) 250 MG capsule Take 1 capsule (250 mg total) by mouth 2 (two) times daily. 10/12/20   Oswald Hillock, MD  torsemide (DEMADEX) 20 MG tablet Take 1 tablet (20 mg total) by mouth 2 (two) times daily. 09/20/20 10/20/20  Harold Hedge, MD  triamcinolone cream (KENALOG) 0.1 % Apply 1 application topically daily. 07/05/19   [provider]  Physical Exam: Vitals:   07/09/21 0006 07/09/21 0007 07/09/21 0008 07/09/21 0100  BP: (!) 203/83 (!) 203/83 (!) 199/85 (!) 196/89  Pulse: (!) 109 (!) 109 (!) 105 (!) 101  Resp: '20 17 18 19  ' Temp:      SpO2: 91% 95% 95% 93%  Weight:      Height:        Constitutional: no pallor, no diaphoresis, calm  Eyes: PERTLA, lids and conjunctivae normal ENMT: Mucous membranes are moist. Posterior pharynx clear of any exudate or lesions.   Neck: supple, no masses  Respiratory: Increased WOB, dyspnea with speech. Rales b/l.   Cardiovascular: Rate ~100 and regular. Pretibial pitting edema b/l.   Abdomen: No distension, no tenderness, soft. Bowel sounds active.  Musculoskeletal: no clubbing / cyanosis. No joint deformity upper and lower extremities.   Skin: no significant rashes, lesions, ulcers. Warm, dry, well-perfused. Neurologic: CN 2-12 grossly intact. Moving all extremities. Alert and oriented.  Psychiatric: Pleasant. Cooperative.    Labs and Imaging on Admission: I have personally reviewed following labs and imaging studies  CBC: Recent Labs  Lab 07/08/21 2358  WBC 12.0*  NEUTROABS 10.6*  HGB 9.0*  HCT 28.6*  MCV 83.4  PLT 373   Basic Metabolic Panel: Recent Labs  Lab  07/08/21 2358  NA 144  K 3.3*  CL 113*  CO2 18*  GLUCOSE 192*  BUN 45*  CREATININE 4.02*  CALCIUM 9.0   GFR: Estimated Creatinine Clearance: 14.8 mL/min (A) (by C-G formula based on SCr of 4.02 mg/dL (H)). Liver Function Tests: No results for input(s): AST, ALT, ALKPHOS, BILITOT, PROT, ALBUMIN in the last 168 hours. No results for input(s): LIPASE, AMYLASE in the last 168 hours. No results for input(s): AMMONIA in the last 168 hours. Coagulation Profile: No results for input(s): INR, PROTIME in the last 168 hours. Cardiac Enzymes: No results for input(s): CKTOTAL, CKMB, CKMBINDEX, TROPONINI in the last 168 hours. BNP (last 3 results) Recent Labs    06/11/21 1057  PROBNP 1,890.0*   HbA1C: No results for input(s): HGBA1C in the last 72 hours. CBG: No results for input(s): GLUCAP in the last 168 hours. Lipid Profile: No results for input(s): CHOL, HDL, LDLCALC, TRIG, CHOLHDL, LDLDIRECT in the last 72 hours. Thyroid Function Tests: No results for input(s): TSH, T4TOTAL, FREET4, T3FREE, THYROIDAB in the last 72 hours. Anemia Panel: No results for input(s): VITAMINB12, FOLATE, FERRITIN, TIBC, IRON, RETICCTPCT in the last 72 hours. Urine analysis:    Component Value Date/Time   COLORURINE STRAW (A) 10/09/2020 1720   APPEARANCEUR CLEAR 10/09/2020 1720   LABSPEC 1.010 10/09/2020 1720   PHURINE 5.0 10/09/2020 1720   GLUCOSEU NEGATIVE 10/09/2020 1720   HGBUR NEGATIVE 10/09/2020 1720   HGBUR negative 09/19/2007 0000   BILIRUBINUR NEGATIVE 10/09/2020 1720   BILIRUBINUR n 11/18/2014 1003   KETONESUR NEGATIVE 10/09/2020 1720   PROTEINUR 100 (A) 10/09/2020 1720   UROBILINOGEN 0.2 11/18/2014 1003   UROBILINOGEN 1.0 01/26/2013 2059   NITRITE NEGATIVE 10/09/2020 1720   LEUKOCYTESUR NEGATIVE 10/09/2020 1720   Sepsis Labs: '@LABRCNTIP' (procalcitonin:4,lacticidven:4) ) Recent Results (from the past 240 hour(s))  Resp Panel by RT-PCR (Flu A&B, Covid) Nasopharyngeal Swab     Status:  None   Collection Time: 07/08/21 11:53 PM   Specimen: Nasopharyngeal Swab; Nasopharyngeal(NP) swabs in vial transport medium  Result Value Ref Range Status   SARS Coronavirus 2 by RT PCR NEGATIVE NEGATIVE Final    Comment: (NOTE) SARS-CoV-2 target nucleic acids are NOT DETECTED.  The SARS-CoV-2 RNA is generally detectable in upper respiratory specimens during the acute phase of infection. The lowest concentration of SARS-CoV-2 viral copies this assay can detect is 138 copies/mL. A negative result does not preclude SARS-Cov-2 infection and should not be used as the sole basis for treatment or other patient management decisions. A negative result may occur with  improper specimen collection/handling, submission of specimen other than nasopharyngeal swab, presence of viral mutation(s) within the areas targeted by this assay, and inadequate number of viral copies(<138 copies/mL). A negative result must be combined with clinical observations, patient history, and epidemiological information. The expected result is Negative.  Fact Sheet for Patients:  EntrepreneurPulse.com.au  Fact Sheet for Healthcare Providers:  IncredibleEmployment.be  This test is no t yet approved or cleared by the [place] FDA and  has been authorized for detection and/or diagnosis of SARS-CoV-2 by FDA under an Emergency Use Authorization (EUA). This EUA will remain  in effect (meaning this test can be used) for the duration of the COVID-19 declaration under Section 564(b)(1) of the Act, 21 U.S.C.section 360bbb-3(b)(1), unless the authorization is terminated  or revoked sooner.       Influenza A by PCR NEGATIVE NEGATIVE Final   Influenza B by PCR NEGATIVE NEGATIVE Final    Comment: (NOTE) The Xpert Xpress SARS-CoV-2/FLU/RSV plus assay is intended as an aid in the diagnosis of influenza from Nasopharyngeal swab specimens and should not be used as a sole basis for  treatment. Nasal washings and aspirates are unacceptable for Xpert Xpress SARS-CoV-2/FLU/RSV testing.  Fact Sheet for Patients: EntrepreneurPulse.com.au  Fact Sheet for Healthcare Providers: IncredibleEmployment.be  This test is not yet approved or cleared by the [place] FDA and has been authorized for detection and/or diagnosis of SARS-CoV-2 by FDA under an Emergency Use Authorization (EUA). This EUA will remain in effect (meaning this test can be used) for the duration of the COVID-19 declaration under Section 564(b)(1) of the Act, 21 U.S.C. section 360bbb-3(b)(1), unless the authorization is terminated or revoked.  Performed at [facility], [place] [address], [place], [place] [address]      Radiological Exams on Admission: DG Chest 2 View  Result Date: 07/09/2021 CLINICAL DATA:  Shortness of breath and cough. EXAM: CHEST - 2 VIEW COMPARISON:  Chest radiograph dated 04/06/2021. FINDINGS: There is cardiomegaly with vascular congestion and edema. No focal consolidation, pleural effusion, or pneumothorax. Sclerotic calcification of the aorta. No acute osseous pathology. IMPRESSION: Cardiomegaly with vascular congestion and edema. No focal consolidation. Electronically Signed   By: Anner Crete M.D.   On: 07/09/2021 00:35    EKG: Independently reviewed. Sinus tachycardia, rate 106, 1st degree AV block.   Assessment/Plan   1. Acute on chronic diastolic CHF; acute hypoxic respiratory failure  - Pt with CHF (EF 50-55% in July 2021) p/w progressive SOB and leg swelling despite a few days of increased torsemide and is found to be in acute CHF with hypoxia  - Given 80 mg IV Lasix and nitroglycerin ointment in ED  - Continue diuresis with Lasix 60 mg IV q12h, update echo, follow daily wt and I/Os, monitor renal function and electrolytes   2. Hypertensive urgency    - BP as high as 209/95 in ED  - Anticipate improvement with diuresis;  also given nitroglycerin ointment in ED  - Continue diuresis, clonidine, Norvasc, doxazosin, Toprol, and as-needed hydralazine    3. CKD V  - SCr is 4.02 in ED, up from 3.70 one month earlier  -  Renally-dose medications, follow closely while diuresing    4. Type II DM  - A1c was 6.8% in January 2022  - Check CBGs and use low-intensity SSI    5. Hypokalemia  - Serum potassium 3.3 in ED  - Supplement, monitor    6. Elevated troponin  - HS troponin is 49 without chest pain  - Likely related to severe HTN and respiratory distress rather than ACS  - Trend troponin, follow-up echo    7. Pulmonary nodule  - Followed by pulmonology with plan for repeat CT in April 2023    DVT prophylaxis: sq heparin  Code Status: Full  Level of Care: Level of care: Telemetry Cardiac Family Communication: wife updated at bedside  Disposition Plan:  Patient is from: home  Anticipated d/c is to: home  Anticipated d/c date is: 07/13/21 Patient currently: Pending improvement in respiratory status, stable renal function  Consults called: none  Admission status: Inpatient     Vianne Bulls, MD [facility]  07/09/2021, 1:46 AM

## 2021-07-09 NOTE — Progress Notes (Signed)
Patient to room 3E20 from ED. Vital signs obtained. On monitor CCMD notified. Alert and oriented to room and call light. Call bell within reach.  Era Bumpers, RN

## 2021-07-09 NOTE — ED Notes (Signed)
Pts work of breathing has significantly decreased. O2 100%. Pt resting comfortably with BIPAP applied.

## 2021-07-09 NOTE — ED Notes (Signed)
RN aware of pt BP

## 2021-07-09 NOTE — Progress Notes (Signed)
Heart Failure Navigator Progress Note  Assessed for Heart & Vascular TOC clinic readiness. Pt resting in bed on 5 LPM O2 per . Family at bedside. Pt able to speak in full sentences. Pt states he is considering PD if he really needs it, but is waiting to talk with a kidney doctor, hoping he does not need to start dialysis soon.  Will follow this admission to continue to assess for HV TOC needs.   Pricilla Holm, MSN, RN Heart Failure Nurse Navigator [phone]

## 2021-07-09 NOTE — ED Notes (Signed)
Pt continues to have an increased work of breathing. RT called, BIPAP put on. Pt O2 97%.

## 2021-07-09 NOTE — Plan of Care (Signed)

## 2021-07-09 NOTE — ED Provider Notes (Signed)
[facility] EMERGENCY DEPARTMENT Provider Note  CSN: [id number] Arrival date & time: 07/08/21 2346  Chief Complaint(s) SOB/CHF ; Hypertensive  HPI Austin [name] is a 86 y.o. male with a past medical history listed below who presents to the emergency department with several days of gradually worsening shortness of breath.  Patient is currently followed by cardiology for his heart failure.  Reports that he had increased bilateral lower extremity edema for the past several weeks on his usual diuretic dose.  His Demadex dose was increased to 60 mg daily last week, however his edema is not improving and now has developed shortness of breath.  He reports wet cough but nonproductive.  No known fevers or chills.  No chest pain.  Shortness of breath is exacerbated with exertion.  Alleviated by rest and immobility.  No abdominal pain.  No other physical complaints.   The history is provided by the patient.   Past Medical History Past Medical History:  Diagnosis Date   ANEMIA DUE TO CHRONIC BLOOD LOSS 03/13/2007   CAROTID ARTERY STENOSIS 05/10/2010   CHF (congestive heart failure) ([place])    DIABETES MELLITUS, TYPE II 09/19/2007   DISEASE, CEREBROVASCULAR NEC 03/05/2007   GERD 03/13/2007   HYPERLIPIDEMIA 03/05/2007   HYPERTENSION 03/05/2007   HYPOKALEMIA 11/09/2009   KNEE PAIN, RIGHT 11/09/2009   PROSTATE CANCER, HX OF 03/05/2007   RENAL DISEASE, CHRONIC 02/03/2009   Patient Active Problem List   Diagnosis Date Noted   Dyspnea 06/01/2021   Upper airway cough syndrome 04/06/2021   Porokeratosis 03/26/2021   Pulmonary nodule 1 cm or greater in diameter 10/22/2020   Acute on chronic renal failure ([place]) 10/09/2020   Mass of right lung 93/81/0175   Acute diastolic CHF (congestive heart failure) ([place]) 09/17/2020   Acute CHF (congestive heart failure) ([place]) 02/23/2020   Acute exacerbation of CHF (congestive heart failure) ([place]) 08/04/2019   CKD (chronic kidney disease), stage IV ([place])  08/04/2019   Elevated troponin 08/04/2019   Hypokalemia 08/04/2019   Hypertensive urgency 03/28/2019   CKD (chronic kidney disease), stage III ([place]) 03/28/2019   Elevated serum immunoglobulin free light chains 02/20/2019   Gastrointestinal hemorrhage with melena    Melena 11/03/2016   Carotid artery stenosis 05/10/2010   Diabetes mellitus with renal complications ([place]) 06/15/8526   Iron deficiency anemia due to chronic blood loss 03/13/2007   GERD 03/13/2007   Dyslipidemia 03/05/2007   Essential hypertension 03/05/2007   DISEASE, CEREBROVASCULAR NEC 03/05/2007   PROSTATE CANCER, HX OF 03/05/2007   Home Medication(s) Prior to Admission medications   Medication Sig Start Date End Date Taking? Authorizing Provider  ACCU-CHEK GUIDE test strip USED TO CHECK BLOOD GLUCOSE TWICE A DAY OR AS NEEDED 10/20/20   Nafziger, Tommi Rumps, NP  Accu-Chek Softclix Lancets lancets USED TO CHECK BLOOD GLUCOSE TWICE A DAY OR AS NEEDED 12/25/20   Nafziger, Tommi Rumps, NP  acetaminophen (TYLENOL) 325 MG tablet Take 325 mg by mouth 4 (four) times daily.    [provider]  albuterol (VENTOLIN HFA) 108 (90 Base) MCG/ACT inhaler Inhale 2 puffs into the lungs every 6 (six) hours as needed for wheezing or shortness of breath. 06/11/21   Martyn Ehrich, NP  amLODipine (NORVASC) 10 MG tablet TAKE 1 TABLET BY MOUTH EVERY DAY 02/12/21   Nafziger, Tommi Rumps, NP  atorvastatin (LIPITOR) 20 MG tablet TAKE 1 TABLET BY MOUTH EVERY DAY 12/29/20   Nafziger, Tommi Rumps, NP  Blood Glucose Monitoring Suppl (ACCU-CHEK AVIVA PLUS) w/Device KIT Used to check  blood glucose 2 times a day or PRN 09/04/19   Nafziger, Tommi Rumps, NP  cholecalciferol (VITAMIN D3) 25 MCG (1000 UNIT) tablet Take 1,000 Units by mouth once a week.    [provider]  cloNIDine (CATAPRES) 0.3 MG tablet Take 1 tablet (0.3 mg total) by mouth 3 (three) times daily. 09/20/20   Harold Hedge, MD  clopidogrel (PLAVIX) 75 MG tablet TAKE 1 TABLET BY MOUTH EVERY DAY Patient taking  differently: Take 75 mg by mouth daily with supper. 05/20/20   Nafziger, Tommi Rumps, NP  doxazosin (CARDURA) 2 MG tablet Take 2 mg by mouth at bedtime.  12/28/17   [provider]  esomeprazole (NEXIUM) 40 MG capsule TAKE 1 CAPSULE BY MOUTH EVERY DAY 05/13/21   Nafziger, Tommi Rumps, NP  glipiZIDE (GLUCOTROL XL) 5 MG 24 hr tablet TAKE 1 TABLET BY MOUTH EVERY DAY 05/28/21   Nafziger, Tommi Rumps, NP  hydrocortisone (ANUSOL-HC) 2.5 % rectal cream PLACE 1 APPLICATION RECTALLY 2 TIMES A DAY. 03/31/21   Burchette, Alinda Sierras, MD  isosorbide-hydrALAZINE (BIDIL) 20-37.5 MG tablet Take 1 tablet by mouth 3 (three) times daily.    [provider]  KLOR-CON M10 10 MEQ tablet Take 2 tablets (20 mEq total) by mouth 2 (two) times daily. Patient taking differently: Take 10 mEq by mouth 2 (two) times daily. 09/20/20 10/20/20  Harold Hedge, MD  lactulose ([place]) 10 GM/15ML solution TAKE 15 ML BY MOUTH 2 TIMES DAILY AS NEEDED FOR MILD CONSTIPATION. 03/18/21   Nafziger, Tommi Rumps, NP  metoprolol succinate (TOPROL-XL) 100 MG 24 hr tablet Take 1 tablet (100 mg total) by mouth daily. Take with or immediately following a meal. 09/21/20 10/21/20  Harold Hedge, MD  polyethylene glycol (MIRALAX / GLYCOLAX) 17 g packet Take 17 g by mouth daily as needed for mild constipation. 10/12/20   Oswald Hillock, MD  predniSONE (DELTASONE) 10 MG tablet Take 4 tabs by mouth daily x 2 days; then 2 tabs by mouth daily x 2 days; then 1 tab by mouth daily x 2 days 04/06/21   Martyn Ehrich, NP  Propylene Glycol (SYSTANE BALANCE OP) Place 1 drop into both eyes daily as needed (dry eyes).    [provider]  saccharomyces boulardii (FLORASTOR) 250 MG capsule Take 1 capsule (250 mg total) by mouth 2 (two) times daily. 10/12/20   Oswald Hillock, MD  torsemide (DEMADEX) 20 MG tablet Take 1 tablet (20 mg total) by mouth 2 (two) times daily. 09/20/20 10/20/20  Harold Hedge, MD  triamcinolone cream (KENALOG) 0.1 % Apply 1 application topically daily. 07/05/19    [provider]                                                                                                                                    Past Surgical History Past Surgical History:  Procedure Laterality Date   CAROTID ARTERY ANGIOPLASTY Right Oct. 10, 2001  ESOPHAGOGASTRODUODENOSCOPY (EGD) WITH PROPOFOL N/A 11/04/2016   Procedure: ESOPHAGOGASTRODUODENOSCOPY (EGD) WITH PROPOFOL;  Surgeon: Otis Brace, MD;  Location: [facility];  Service: Gastroenterology;  Laterality: N/A;   LAPAROSCOPIC APPENDECTOMY  02/20/2012   Procedure: APPENDECTOMY LAPAROSCOPIC;  Surgeon: Stark Klein, MD;  Location: [facility] OR;  Service: General;  Laterality: N/A;   PROSTATE SURGERY     prostatectomy   Family History Family History  Problem Relation Age of Onset   Hypertension Mother    Cancer Father        Mesothelioma    Stomach cancer Brother    Cancer Brother    Cancer - Cervical Brother    Cancer Brother    Diabetes Brother    Esophageal cancer Neg Hx    Colon cancer Neg Hx    Pancreatic cancer Neg Hx     Social History Social History   Tobacco Use   Smoking status: Former    Packs/day: 1.00    Years: 10.00    Pack years: 10.00    Types: Cigarettes    Quit date: 08/22/1974    Years since quitting: 46.9   Smokeless tobacco: Never  Vaping Use   Vaping Use: Never used  Substance Use Topics   Alcohol use: No    Alcohol/week: 0.0 standard drinks   Drug use: No   Allergies Aspirin  Review of Systems Review of Systems All other systems are reviewed and are negative for acute change except as noted in the HPI  Physical Exam Vital Signs  I have reviewed the triage vital signs BP (!) 203/83 (BP Location: Left Arm)   Pulse (!) 109   Temp 99.5 F (37.5 C)   Resp 20   Ht _0  (1.702 m)   Wt 92 kg   SpO2 91%   BMI 31.77 kg/m   Physical Exam Vitals reviewed.  Constitutional:      General: He is not in acute distress.    Appearance: He is well-developed. He is  not diaphoretic.  HENT:     Head: Normocephalic and atraumatic.     Nose: Nose normal.  Eyes:     General: No scleral icterus.       Right eye: No discharge.        Left eye: No discharge.     Conjunctiva/sclera: Conjunctivae normal.     Pupils: Pupils are equal, round, and reactive to light.  Cardiovascular:     Rate and Rhythm: Regular rhythm. Tachycardia present.     Heart sounds: No murmur heard.   No friction rub. No gallop.  Pulmonary:     Effort: Pulmonary effort is normal. Tachypnea present. No respiratory distress.     Breath sounds: No stridor. Examination of the right-middle field reveals rales. Examination of the left-middle field reveals rales. Examination of the right-lower field reveals rales. Examination of the left-lower field reveals rales. Rales present. No wheezing.  Abdominal:     General: There is no distension.     Palpations: Abdomen is soft.     Tenderness: There is no abdominal tenderness.  Musculoskeletal:        General: No tenderness.     Cervical back: Normal range of motion and neck supple.     Right lower leg: 2+ Pitting Edema present.     Left lower leg: 2+ Pitting Edema present.  Skin:    General: Skin is warm and dry.     Findings: No erythema or rash.  Neurological:     Mental  Status: He is alert and oriented to person, place, and time.    ED Results and Treatments Labs (all labs ordered are listed, but only abnormal results are displayed) Labs Reviewed  CBC WITH DIFFERENTIAL/PLATELET - Abnormal; Notable for the following components:      Result Value   WBC 12.0 (*)    RBC 3.43 (*)    Hemoglobin 9.0 (*)    HCT 28.6 (*)    Neutro Abs 10.6 (*)    All other components within normal limits  BASIC METABOLIC PANEL - Abnormal; Notable for the following components:   Potassium 3.3 (*)    Chloride 113 (*)    CO2 18 (*)    Glucose, Bld 192 (*)    BUN 45 (*)    Creatinine, Ser 4.02 (*)    GFR, Estimated 14 (*)    All other components  within normal limits  TROPONIN I (HIGH SENSITIVITY) - Abnormal; Notable for the following components:   Troponin I (High Sensitivity) 49 (*)    All other components within normal limits  RESP PANEL BY RT-PCR (FLU A&B, COVID) ARPGX2  BRAIN NATRIURETIC PEPTIDE                                                                                                                         EKG  EKG Interpretation  Date/Time:  Friday July 09 2021 00:54:52 EST Ventricular Rate:  108 PR Interval:  362 QRS Duration: 93 QT Interval:  343 QTC Calculation: 460 R Axis:   -7 Text Interpretation: Sinus tachycardia Prolonged PR interval Left atrial enlargement Repol abnrm suggests ischemia, lateral leads Confirmed by Addison Lank [phone]) on 07/09/2021 12:56:01 AM       Radiology DG Chest 2 View  Result Date: 07/09/2021 CLINICAL DATA:  Shortness of breath and cough. EXAM: CHEST - 2 VIEW COMPARISON:  Chest radiograph dated 04/06/2021. FINDINGS: There is cardiomegaly with vascular congestion and edema. No focal consolidation, pleural effusion, or pneumothorax. Sclerotic calcification of the aorta. No acute osseous pathology. IMPRESSION: Cardiomegaly with vascular congestion and edema. No focal consolidation. Electronically Signed   By: Anner Crete M.D.   On: 07/09/2021 00:35    Pertinent labs & imaging results that were available during my care of the patient were reviewed by me and considered in my medical decision making (see MDM for details).  Medications Ordered in ED Medications  nitroGLYCERIN (NITROGLYN) 2 % ointment 1 inch (has no administration in time range)  furosemide (LASIX) injection 80 mg (has no administration in time range)  Procedures .1-3 Lead EKG Interpretation Performed by: Fatima Blank, MD Authorized by: Fatima Blank, MD      Interpretation: abnormal     ECG rate:  103   ECG rate assessment: tachycardic     Rhythm: sinus tachycardia     Ectopy: none     Conduction: normal   .Critical Care Performed by: Fatima Blank, MD Authorized by: Fatima Blank, MD   Critical care provider statement:    Critical care time (minutes):  45   Critical care time was exclusive of:  Separately billable procedures and treating other patients   Critical care was necessary to treat or prevent imminent or life-threatening deterioration of the following conditions:  Respiratory failure and cardiac failure   Critical care was time spent personally by me on the following activities:  Development of treatment plan with patient or surrogate, discussions with consultants, evaluation of patient's response to treatment, examination of patient, obtaining history from patient or surrogate, review of old charts, re-evaluation of patient's condition, pulse oximetry, ordering and review of radiographic studies, ordering and review of laboratory studies and ordering and performing treatments and interventions   Care discussed with: admitting provider    (including critical care time)  Medical Decision Making / ED Course I have reviewed the nursing notes for this encounter and the patient's prior records (if available in EHR or on provided paperwork).  Emonte Dieujuste was evaluated in Emergency Department on 07/09/2021 for the symptoms described in the history of present illness. He was evaluated in the context of the global COVID-19 pandemic, which necessitated consideration that the patient might be at risk for infection with the SARS-CoV-2 virus that causes COVID-19. Institutional protocols and algorithms that pertain to the evaluation of patients at risk for COVID-19 are in a state of rapid change based on information released by regulatory bodies including the CDC and federal and state organizations. These policies and algorithms were  followed during the patient's care in the ED.     Several weeks of persistent peripheral edema now with several days of gradually worsening shortness of breath. Patient is volume overloaded on exam. Noted to be hypoxic on room air.  Requires 2 L supplemental oxygen. Also noted to have a low-grade temp at 99.5. Patient is hypertensive with systolics in the 093J.  No suspicious for dCHF exacerbation.  Will need to assess for superimposed infection. Labs and imaging ordered.  Pertinent labs & imaging results that were available during my care of the patient were reviewed by me and considered in my medical decision making:  Work-up is consistent with hypertensive emergency with CHF exacerbation.  No evidence of superimposed pneumonia noted on chest x-ray.  Renal function close to his most recent baseline.  Patient provided with high-dose IV Lasix.  Nitroglycerin paste to assist with blood pressure and fluid shift.  Will need admission for continued management.     Final Clinical Impression(s) / ED Diagnoses Final diagnoses:  Hypertensive emergency  Acute on chronic diastolic CHF (congestive heart failure) ([place])  Hypoxia     This chart was dictated using voice recognition software.  Despite best efforts to proofread,  errors can occur which can change the documentation meaning.    Fatima Blank, MD 07/09/21 [phone]

## 2021-07-10 ENCOUNTER — Inpatient Hospital Stay (HOSPITAL_COMMUNITY): Payer: Medicare PPO

## 2021-07-10 ENCOUNTER — Encounter (HOSPITAL_COMMUNITY): Payer: Self-pay | Admitting: Family Medicine

## 2021-07-10 DIAGNOSIS — R778 Other specified abnormalities of plasma proteins: Secondary | ICD-10-CM | POA: Diagnosis not present

## 2021-07-10 DIAGNOSIS — N185 Chronic kidney disease, stage 5: Secondary | ICD-10-CM | POA: Diagnosis not present

## 2021-07-10 DIAGNOSIS — I5033 Acute on chronic diastolic (congestive) heart failure: Secondary | ICD-10-CM

## 2021-07-10 DIAGNOSIS — E1122 Type 2 diabetes mellitus with diabetic chronic kidney disease: Secondary | ICD-10-CM | POA: Diagnosis not present

## 2021-07-10 DIAGNOSIS — I161 Hypertensive emergency: Secondary | ICD-10-CM | POA: Diagnosis not present

## 2021-07-10 LAB — ECHOCARDIOGRAM COMPLETE
AR max vel: 3.05 cm2
AV Area VTI: 2.81 cm2
AV Area mean vel: 2.97 cm2
AV Mean grad: 6 mmHg
AV Peak grad: 11.4 mmHg
Ao pk vel: 1.69 m/s
Area-P 1/2: 4.21 cm2
Height: 67 in
P 1/2 time: 637 msec
S' Lateral: 4 cm
Single Plane A2C EF: 51.9 %
Weight: 2920.65 oz

## 2021-07-10 LAB — URINALYSIS, ROUTINE W REFLEX MICROSCOPIC
Bilirubin Urine: NEGATIVE
Glucose, UA: 50 mg/dL — AB
Ketones, ur: NEGATIVE mg/dL
Leukocytes,Ua: NEGATIVE
Nitrite: NEGATIVE
Protein, ur: 100 mg/dL — AB
Specific Gravity, Urine: 1.012 (ref 1.005–1.030)
pH: 5 (ref 5.0–8.0)

## 2021-07-10 LAB — GLUCOSE, CAPILLARY
Glucose-Capillary: 135 mg/dL — ABNORMAL HIGH (ref 70–99)
Glucose-Capillary: 178 mg/dL — ABNORMAL HIGH (ref 70–99)
Glucose-Capillary: 117 mg/dL — ABNORMAL HIGH (ref 70–99)
Glucose-Capillary: 194 mg/dL — ABNORMAL HIGH (ref 70–99)

## 2021-07-10 LAB — CBC
HCT: 26.5 % — ABNORMAL LOW (ref 39.0–52.0)
Hemoglobin: 8.3 g/dL — ABNORMAL LOW (ref 13.0–17.0)
MCH: 25.9 pg — ABNORMAL LOW (ref 26.0–34.0)
MCHC: 31.3 g/dL (ref 30.0–36.0)
MCV: 82.8 fL (ref 80.0–100.0)
Platelets: 180 10*3/uL (ref 150–400)
RBC: 3.2 MIL/uL — ABNORMAL LOW (ref 4.22–5.81)
RDW: 15 % (ref 11.5–15.5)
WBC: 10 10*3/uL (ref 4.0–10.5)
nRBC: 0 % (ref 0.0–0.2)

## 2021-07-10 LAB — RESPIRATORY PANEL BY PCR

## 2021-07-10 LAB — BASIC METABOLIC PANEL
Anion gap: 10 (ref 5–15)
BUN: 48 mg/dL — ABNORMAL HIGH (ref 8–23)
CO2: 20 mmol/L — ABNORMAL LOW (ref 22–32)
Calcium: 8.5 mg/dL — ABNORMAL LOW (ref 8.9–10.3)
Chloride: 112 mmol/L — ABNORMAL HIGH (ref 98–111)
Creatinine, Ser: 4.52 mg/dL — ABNORMAL HIGH (ref 0.61–1.24)
GFR, Estimated: 12 mL/min — ABNORMAL LOW (ref >60)
Glucose, Bld: 151 mg/dL — ABNORMAL HIGH (ref 70–99)
Potassium: 3.7 mmol/L (ref 3.5–5.1)
Sodium: 142 mmol/L (ref 135–145)

## 2021-07-10 LAB — SODIUM, URINE, RANDOM: Sodium, Ur: 29 mmol/L

## 2021-07-10 LAB — CREATININE, URINE, RANDOM: Creatinine, Urine: 121.86 mg/dL

## 2021-07-10 NOTE — Progress Notes (Signed)
Progress Note    Austin [name]  ENI:[id number] DOB: 1935/04/15  DOA: 07/08/2021 PCP: Dorothyann Peng, NP    Brief Narrative:     Medical records reviewed and are as summarized below:  Austin [name] is an 86 y.o. male with medical history significant for type 2 diabetes mellitus, resistant hypertension, CKD IV or V, CHF, chronic cough, and pulmonary nodule, now presenting to the emergency department for evaluation of shortness of breath.  Patient reports a few days of progressive dyspnea and increased bilateral leg swelling despite recent increase in torsemide.     Assessment/Plan:   Principal Problem:   Diabetes mellitus with renal complications (HCC) Active Problems:   Hypertensive urgency   Elevated troponin   Hypokalemia   Pulmonary nodule 1 cm or greater in diameter   Acute on chronic diastolic CHF (congestive heart failure) (HCC)   CKD (chronic kidney disease), stage V (HCC)   Acute on chronic diastolic CHF; acute hypoxic respiratory failure  - Pt with CHF (EF 50-55% in July 2021) p/w progressive SOB and leg swelling despite a few days of increased torsemide and is found to be in acute CHF with hypoxia  - Given 80 mg IV Lasix  - Continue diuresis with Lasix 60 mg IV q12h, update echo, follow daily wt and I/Os, monitor renal function and electrolytes    Hypertensive urgency    - BP as high as 209/95 in ED  - improved with diuresis   Low-grade fever -unclear etiology -BC if spikes again -hold on abx for now -NP swab  CKD V  - SCr is 4.02 in ED, up from 3.70 one month earlier  - Renal consult   Type II DM  - A1c was 6.8% in January 2022  - Check CBGs and use low-intensity SSI     Hypokalemia  -replete  Elevated troponin  - HS troponin is 49 without chest pain  - Likely related to severe HTN and respiratory distress rather than ACS    Pulmonary nodule  - Followed by pulmonology with plan for repeat CT in April 2023         Family  Communication/Anticipated D/C date and plan/Code Status   DVT prophylaxis: heparin Code Status: Full Code.   Disposition Plan: Status is: Inpatient  Remains inpatient appropriate because: needs improvement  in kidneys         Medical Consultants:   renal  Subjective:   Getting echo, no CP  Objective:    Vitals:   07/10/21 0457 07/10/21 0700 07/10/21 0741 07/10/21 1144  BP: (!) 157/68 (!) 160/63  (!) 159/68  Pulse: 66 65  65  Resp: 17 18  16   Temp: 98.2 F (36.8 C)  99.8 F (37.7 C) 98.1 F (36.7 C)  TempSrc: Oral  Oral Oral  SpO2: 97% 95%  90%  Weight: 82.8 kg     Height:        Intake/Output Summary (Last 24 hours) at 07/10/2021 1449 Last data filed at 07/10/2021 1436 Gross per 24 hour  Intake 1080 ml  Output 2300 ml  Net -1220 ml   Filed Weights   07/08/21 2355 07/09/21 1429 07/10/21 0457  Weight: 92 kg 83 kg 82.8 kg    Exam:  General: Appearance:     Overweight male in no acute distress     Lungs:     respirations unlabored, no wheezing, diminished  Heart:    Normal heart rate.  MS:   All extremities are intact.  Neurologic:   Awake, alert, oriented x 3     Data Reviewed:   I have personally reviewed following labs and imaging studies:  Labs: Labs show the following:   Basic Metabolic Panel: Recent Labs  Lab 07/08/21 2358 07/09/21 0333 07/09/21 0908 07/10/21 0246  NA 144  --  143 142  K 3.3*  --  3.3* 3.7  CL 113*  --  114* 112*  CO2 18*  --  19* 20*  GLUCOSE 192*  --  162* 151*  BUN 45*  --  46* 48*  CREATININE 4.02*  --  4.04* 4.52*  CALCIUM 9.0  --  8.7* 8.5*  MG  --  2.0  --   --    GFR Estimated Creatinine Clearance: 12.5 mL/min (A) (by C-G formula based on SCr of 4.52 mg/dL (H)). Liver Function Tests: No results for input(s): AST, ALT, ALKPHOS, BILITOT, PROT, ALBUMIN in the last 168 hours. No results for input(s): LIPASE, AMYLASE in the last 168 hours. No results for input(s): AMMONIA in the last 168  hours. Coagulation profile No results for input(s): INR, PROTIME in the last 168 hours.  CBC: Recent Labs  Lab 07/08/21 2358 07/09/21 0333 07/10/21 0246  WBC 12.0* 9.2 10.0  NEUTROABS 10.6*  --   --   HGB 9.0* 7.9* 8.3*  HCT 28.6* 25.0* 26.5*  MCV 83.4 83.3 82.8  PLT 297 270 180   Cardiac Enzymes: No results for input(s): CKTOTAL, CKMB, CKMBINDEX, TROPONINI in the last 168 hours. BNP (last 3 results) Recent Labs    06/11/21 1057  PROBNP 1,890.0*   CBG: Recent Labs  Lab 07/09/21 1116 07/09/21 1623 07/09/21 2108 07/10/21 0553 07/10/21 1200  GLUCAP 143* 143* 161* 135* 178*   D-Dimer: No results for input(s): DDIMER in the last 72 hours. Hgb A1c: Recent Labs    07/09/21 0333  HGBA1C 6.3*   Lipid Profile: No results for input(s): CHOL, HDL, LDLCALC, TRIG, CHOLHDL, LDLDIRECT in the last 72 hours. Thyroid function studies: No results for input(s): TSH, T4TOTAL, T3FREE, THYROIDAB in the last 72 hours.  Invalid input(s): FREET3 Anemia work up: No results for input(s): VITAMINB12, FOLATE, FERRITIN, TIBC, IRON, RETICCTPCT in the last 72 hours. Sepsis Labs: Recent Labs  Lab 07/08/21 2358 07/09/21 0333 07/10/21 0246  WBC 12.0* 9.2 10.0    Microbiology Recent Results (from the past 240 hour(s))  Resp Panel by RT-PCR (Flu A&B, Covid) Nasopharyngeal Swab     Status: None   Collection Time: 07/08/21 11:53 PM   Specimen: Nasopharyngeal Swab; Nasopharyngeal(NP) swabs in vial transport medium  Result Value Ref Range Status   SARS Coronavirus 2 by RT PCR NEGATIVE NEGATIVE Final    Comment: (NOTE) SARS-CoV-2 target nucleic acids are NOT DETECTED.  The SARS-CoV-2 RNA is generally detectable in upper respiratory specimens during the acute phase of infection. The lowest concentration of SARS-CoV-2 viral copies this assay can detect is 138 copies/mL. A negative result does not preclude SARS-Cov-2 infection and should not be used as the sole basis for treatment  or other patient management decisions. A negative result may occur with  improper specimen collection/handling, submission of specimen other than nasopharyngeal swab, presence of viral mutation(s) within the areas targeted by this assay, and inadequate number of viral copies(<138 copies/mL). A negative result must be combined with clinical observations, patient history, and epidemiological information. The expected result is Negative.  Fact Sheet for Patients:  EntrepreneurPulse.com.au  Fact Sheet for Healthcare Providers:  IncredibleEmployment.be  This test is no  t yet approved or cleared by the [place] and  has been authorized for detection and/or diagnosis of SARS-CoV-2 by FDA under an Emergency Use Authorization (EUA). This EUA will remain  in effect (meaning this test can be used) for the duration of the COVID-19 declaration under Section 564(b)(1) of the Act, 21 U.S.C.section 360bbb-3(b)(1), unless the authorization is terminated  or revoked sooner.       Influenza A by PCR NEGATIVE NEGATIVE Final   Influenza B by PCR NEGATIVE NEGATIVE Final    Comment: (NOTE) The Xpert Xpress SARS-CoV-2/FLU/RSV plus assay is intended as an aid in the diagnosis of influenza from Nasopharyngeal swab specimens and should not be used as a sole basis for treatment. Nasal washings and aspirates are unacceptable for Xpert Xpress SARS-CoV-2/FLU/RSV testing.  Fact Sheet for Patients: EntrepreneurPulse.com.au  Fact Sheet for Healthcare Providers: IncredibleEmployment.be  This test is not yet approved or cleared by the [place] FDA and has been authorized for detection and/or diagnosis of SARS-CoV-2 by FDA under an Emergency Use Authorization (EUA). This EUA will remain in effect (meaning this test can be used) for the duration of the COVID-19 declaration under Section 564(b)(1) of the Act, 21 U.S.C. section  360bbb-3(b)(1), unless the authorization is terminated or revoked.  Performed at [facility], [place] [address], [place], [place] [address]   Respiratory (~20 pathogens) panel by PCR     Status: None   Collection Time: 07/10/21 10:20 AM   Specimen: Nasopharyngeal Swab; Respiratory  Result Value Ref Range Status   Adenovirus NOT DETECTED NOT DETECTED Final   Coronavirus 229E NOT DETECTED NOT DETECTED Final    Comment: (NOTE) The Coronavirus on the Respiratory Panel, DOES NOT test for the novel  Coronavirus (2019 nCoV)    Coronavirus HKU1 NOT DETECTED NOT DETECTED Final   Coronavirus NL63 NOT DETECTED NOT DETECTED Final   Coronavirus OC43 NOT DETECTED NOT DETECTED Final   Metapneumovirus NOT DETECTED NOT DETECTED Final   Rhinovirus / Enterovirus NOT DETECTED NOT DETECTED Final   Influenza A NOT DETECTED NOT DETECTED Final   Influenza B NOT DETECTED NOT DETECTED Final   Parainfluenza Virus 1 NOT DETECTED NOT DETECTED Final   Parainfluenza Virus 2 NOT DETECTED NOT DETECTED Final   Parainfluenza Virus 3 NOT DETECTED NOT DETECTED Final   Parainfluenza Virus 4 NOT DETECTED NOT DETECTED Final   Respiratory Syncytial Virus NOT DETECTED NOT DETECTED Final   Bordetella pertussis NOT DETECTED NOT DETECTED Final   Bordetella Parapertussis NOT DETECTED NOT DETECTED Final   Chlamydophila pneumoniae NOT DETECTED NOT DETECTED Final   Mycoplasma pneumoniae NOT DETECTED NOT DETECTED Final    Comment: Performed at [facility], [place]. [address] [place], [place] [address]    Procedures and diagnostic studies:  DG Chest 2 View  Result Date: 07/09/2021 CLINICAL DATA:  Shortness of breath and cough. EXAM: CHEST - 2 VIEW COMPARISON:  Chest radiograph dated 04/06/2021. FINDINGS: There is cardiomegaly with vascular congestion and edema. No focal consolidation, pleural effusion, or pneumothorax. Sclerotic calcification of the aorta. No acute osseous pathology. IMPRESSION: Cardiomegaly with  vascular congestion and edema. No focal consolidation. Electronically Signed   By: Anner Crete M.D.   On: 07/09/2021 00:35   ECHOCARDIOGRAM COMPLETE  Result Date: 07/10/2021    ECHOCARDIOGRAM REPORT   Patient Name:   Austin [name] Date of Exam: 07/10/2021 Medical Rec #:  [id number]  Height:       67.0 in Accession #:  [id number] Weight:       182.5 lb Date of Birth:  1935-01-10  BSA:          1.945 m Patient Age:    75 years   BP:           160/63 mmHg Patient Gender: M          HR:           87 bpm. Exam Location:  Inpatient Procedure: 2D Echo, 3D Echo, Cardiac Doppler and Color Doppler Indications:    I50.40* Unspecified combined systolic (congestive) and diastolic                 (congestive) heart failure  History:        Patient has prior history of Echocardiogram examinations, most                 recent 02/25/2020. CHF, CAD, Aortic Valve Disease,                 Signs/Symptoms:Dyspnea and Chest Pain; Risk                 Factors:Dyslipidemia, Hypertension and Diabetes. Cancer.  Sonographer:    Roseanna Rainbow RDCS Referring Phys: [id number] [place]  1. Left ventricular ejection fraction, by estimation, is 55 to 60%. The left ventricle has normal function. The left ventricle has no regional wall motion abnormalities. There is moderate left ventricular hypertrophy. Left ventricular diastolic parameters are consistent with Grade I diastolic dysfunction (impaired relaxation). Elevated left atrial pressure.  2. Right ventricular systolic function is normal. The right ventricular size is normal. There is severely elevated pulmonary artery systolic pressure.  3. Left atrial size was mildly dilated.  4. Moderate pleural effusion in the left lateral region.  5. The mitral valve is abnormal. Mild mitral valve regurgitation. No evidence of mitral stenosis.  6. The tricuspid valve is abnormal. Tricuspid valve regurgitation is moderate.  7. The aortic valve is tricuspid. There is mild calcification of the  aortic valve. There is mild thickening of the aortic valve. Aortic valve regurgitation is moderate. No aortic stenosis is present.  8. Aortic dilatation noted. There is mild dilatation of the ascending aorta, measuring 38 mm.  9. The inferior vena cava is dilated in size with <50% respiratory variability, suggesting right atrial pressure of 15 mmHg. FINDINGS  Left Ventricle: Left ventricular ejection fraction, by estimation, is 55 to 60%. The left ventricle has normal function. The left ventricle has no regional wall motion abnormalities. The left ventricular internal cavity size was normal in size. There is  moderate left ventricular hypertrophy. Left ventricular diastolic parameters are consistent with Grade I diastolic dysfunction (impaired relaxation). Elevated left atrial pressure. Right Ventricle: The right ventricular size is normal. No increase in right ventricular wall thickness. Right ventricular systolic function is normal. There is severely elevated pulmonary artery systolic pressure. The tricuspid regurgitant velocity is 3.43 m/s, and with an assumed right atrial pressure of 15 mmHg, the estimated right ventricular systolic pressure is 24.2 mmHg. Left Atrium: Left atrial size was mildly dilated. Right Atrium: Right atrial size was normal in size. Pericardium: There is no evidence of pericardial effusion. Mitral Valve: The mitral valve is abnormal. There is mild thickening of the mitral valve leaflet(s). There is mild calcification of the mitral valve leaflet(s). Mild mitral annular calcification. Mild mitral valve regurgitation. No evidence of mitral valve stenosis. Tricuspid Valve: The tricuspid valve is abnormal. Tricuspid valve regurgitation is moderate .  No evidence of tricuspid stenosis. Aortic Valve: The aortic valve is tricuspid. There is mild calcification of the aortic valve. There is mild thickening of the aortic valve. There is mild aortic valve annular calcification. Aortic valve  regurgitation is moderate. Aortic regurgitation PHT  measures 637 msec. No aortic stenosis is present. Aortic valve mean gradient measures 6.0 mmHg. Aortic valve peak gradient measures 11.4 mmHg. Aortic valve area, by VTI measures 2.81 cm. Pulmonic Valve: The pulmonic valve was not well visualized. Pulmonic valve regurgitation is not visualized. No evidence of pulmonic stenosis. Aorta: Aortic dilatation noted. There is mild dilatation of the ascending aorta, measuring 38 mm. Venous: The inferior vena cava is dilated in size with less than 50% respiratory variability, suggesting right atrial pressure of 15 mmHg. IAS/Shunts: No atrial level shunt detected by color flow Doppler. Additional Comments: There is a moderate pleural effusion in the left lateral region.  LEFT VENTRICLE PLAX 2D LVIDd:         5.70 cm      Diastology LVIDs:         4.00 cm      LV e' medial:    5.44 cm/s LV PW:         1.40 cm      LV E/e' medial:  20.6 LV IVS:        1.20 cm      LV e' lateral:   8.70 cm/s LVOT diam:     2.00 cm      LV E/e' lateral: 12.9 LV SV:         94 LV SV Index:   48 LVOT Area:     3.14 cm                              3D Volume EF: LV Volumes (MOD)            3D EF:        51 % LV vol d, MOD A2C: 165.0 ml LV EDV:       212 ml LV vol s, MOD A2C: 79.3 ml  LV ESV:       103 ml LV SV MOD A2C:     85.7 ml  LV SV:        109 ml RIGHT VENTRICLE             IVC RV S prime:     16.80 cm/s  IVC diam: 2.70 cm TAPSE (M-mode): 2.1 cm LEFT ATRIUM             Index        RIGHT ATRIUM           Index LA diam:        4.50 cm 2.31 cm/m   RA Area:     19.60 cm LA Vol (A2C):   74.3 ml 38.20 ml/m  RA Volume:   56.40 ml  28.99 ml/m LA Vol (A4C):   68.9 ml 35.42 ml/m LA Biplane Vol: 75.6 ml 38.86 ml/m  AORTIC VALVE                     PULMONIC VALVE AV Area (Vmax):    3.05 cm      PR End Diast Vel: 2.23 msec AV Area (Vmean):   2.97 cm AV Area (VTI):     2.81 cm AV Vmax:  169.00 cm/s AV Vmean:          109.000 cm/s AV VTI:             0.333 m AV Peak Grad:      11.4 mmHg AV Mean Grad:      6.0 mmHg LVOT Vmax:         164.00 cm/s LVOT Vmean:        103.000 cm/s LVOT VTI:          0.298 m LVOT/AV VTI ratio: 0.89 AI PHT:            637 msec  AORTA Ao Root diam: 3.80 cm Ao Asc diam:  3.80 cm MITRAL VALVE                TRICUSPID VALVE MV Area (PHT): 4.21 cm     TR Peak grad:   47.1 mmHg MV Decel Time: 180 msec     TR Vmax:        343.00 cm/s MV E velocity: 112.00 cm/s MV A velocity: 68.00 cm/s   SHUNTS MV E/A ratio:  1.65         Systemic VTI:  0.30 m                             Systemic Diam: 2.00 cm Carlyle Dolly MD Electronically signed by Carlyle Dolly MD Signature Date/Time: 07/10/2021/1:15:14 PM    Final     Medications:    amLODipine  10 mg Oral Daily   atorvastatin  20 mg Oral Daily   cloNIDine  0.3 mg Oral TID   clopidogrel  75 mg Oral Q supper   doxazosin  2 mg Oral QHS   furosemide  60 mg Intravenous BID   heparin  5,000 Units Subcutaneous Q8H   hydrocortisone  1 application Rectal BID   insulin aspart  0-6 Units Subcutaneous TID WC   isosorbide-hydrALAZINE  1 tablet Oral TID   metoprolol succinate  100 mg Oral Daily   pantoprazole  40 mg Oral Daily   potassium chloride  10 mEq Oral BID   potassium chloride  20 mEq Oral Once   sodium chloride flush  3 mL Intravenous Q12H   Continuous Infusions:  sodium chloride       LOS: 1 day   Geradine Girt  [facility]   How to contact the [facility] Attending or Consulting provider [place] or covering provider during after hours [place], for this patient?  Check the care team in [facility] and look for a) attending/consulting [facility] provider listed and b) the [facility] team listed Log into [email or web address] and use [facility] universal password to access. If you do not have the password, please contact the hospital operator. Locate the [facility] provider you are looking for under [facility] and page to a number that you can be directly reached. If you still have  difficulty reaching the provider, please page the [facility] (Director on Call) for the Hospitalists listed on amion for assistance.  07/10/2021, 2:49 PM

## 2021-07-10 NOTE — Progress Notes (Signed)
Pt placed on bipap for Increased WOB, and 02 desaturation.

## 2021-07-10 NOTE — Consult Note (Signed)
Renal Service Consult Note [facility]  [facility]x 07/10/2021 Sol Blazing, MD Requesting Physician: Dr. Eliseo Squires  Reason for Consult: Renal failure HPI: The patient is a 86 y.o. year-old w/ hx of anemia, chronic GI bleed, DM2, HTN, HL, hx prostate cancer and CKD who presented to ED on 11/17 for cough and bilat LE swelling for several days w/ hx of CHF f/b Dr Terrence Dupont.  Pt reported progressive SOB and ^'d leg swelling bilaterally despite recent ^ in torsemide dose. In ED SpO2 was 91% on RA, slightly ^'d HR, BP 209/ 95, EKG w/o acute changes. CXR showed vasc congestion and CM and edema. Creat 4.0, WBC 12K Hb 9, BNP 2300.  Pt was admitted and started on IV lasix 66m bid for decomp CHF/ fluid overload. Asked to see for renal failure/ diuresis assistance.   Pt seen in room. States his knees and thighs are already feeling better. He had his torsemide ^'d from 20 bid to tid and "still wasn't getting the fluid off." [facility]t is the coming Wednesday.    ROS - denies CP, no joint pain, no HA, no blurry vision, no rash, no diarrhea, no nausea/ vomiting, no dysuria, no difficulty voiding   Past Medical History  Past Medical History:  Diagnosis Date   ANEMIA DUE TO CHRONIC BLOOD LOSS 03/13/2007   CAROTID ARTERY STENOSIS 05/10/2010   CHF (congestive heart failure) ([facility]    DIABETES MELLITUS, TYPE II 09/19/2007   DISEASE, CEREBROVASCULAR NEC 03/05/2007   GERD 03/13/2007   HYPERLIPIDEMIA 03/05/2007   HYPERTENSION 03/05/2007   HYPOKALEMIA 11/09/2009   KNEE PAIN, RIGHT 11/09/2009   PROSTATE CANCER, HX OF 03/05/2007   RENAL DISEASE, CHRONIC 02/03/2009   Past Surgical History  Past Surgical History:  Procedure Laterality Date   CAROTID ARTERY ANGIOPLASTY Right Oct. 10, 2001   ESOPHAGOGASTRODUODENOSCOPY (EGD) WITH PROPOFOL N/A 11/04/2016   Procedure: ESOPHAGOGASTRODUODENOSCOPY (EGD) WITH PROPOFOL;  Surgeon: POtis Brace MD;  [facility] [facility]  Service:  Gastroenterology;  Laterality: N/A;   LAPAROSCOPIC APPENDECTOMY  02/20/2012   Procedure: APPENDECTOMY LAPAROSCOPIC;  Surgeon: FStark Klein MD;  Location: [facility];  Service: General;  Laterality: N/A;   PROSTATE SURGERY     prostatectomy   Family History  Family History  Problem Relation Age of Onset   Hypertension Mother    Cancer Father        Mesothelioma    Stomach cancer Brother    Cancer Brother    Cancer - Cervical Brother    Cancer Brother    Diabetes Brother    Esophageal cancer Neg Hx    Colon cancer Neg Hx    Pancreatic cancer Neg Hx    Social History  reports that he quit smoking about 46 years ago. His smoking use included cigarettes. He has a 10.00 pack-year smoking history. He has never used smokeless tobacco. He reports that he does not drink alcohol and does not use drugs. Allergies  Allergies  Allergen Reactions   Aspirin Other (See Comments)    High doses causes stomach ulcer and bleeding   Home medications Prior to Admission medications   Medication Sig Start Date End Date Taking? Authorizing Provider  acetaminophen (TYLENOL) 325 MG tablet Take 325 mg by mouth 4 (four) times daily.   Yes [provider]  albuterol (VENTOLIN HFA) 108 (90 Base) MCG/ACT inhaler Inhale 2 puffs into the lungs every 6 (six) hours as needed for wheezing or shortness of  breath. 06/11/21  Yes Martyn Ehrich, NP  amLODipine (NORVASC) 10 MG tablet TAKE 1 TABLET BY MOUTH EVERY DAY Patient taking differently: Take 10 mg by mouth daily. 02/12/21  Yes Nafziger, Tommi Rumps, NP  atorvastatin (LIPITOR) 20 MG tablet TAKE 1 TABLET BY MOUTH EVERY DAY Patient taking differently: Take 20 mg by mouth daily. 12/29/20  Yes Nafziger, Tommi Rumps, NP  cholecalciferol (VITAMIN D3) 25 MCG (1000 UNIT) tablet Take 1,000 Units by mouth daily.   Yes [provider]  cloNIDine (CATAPRES) 0.3 MG tablet Take 1 tablet (0.3 mg total) by mouth 3 (three) times daily. 09/20/20  Yes Harold Hedge, MD  clopidogrel  (PLAVIX) 75 MG tablet TAKE 1 TABLET BY MOUTH EVERY DAY Patient taking differently: Take 75 mg by mouth daily. 05/20/20  Yes Nafziger, Tommi Rumps, NP  doxazosin (CARDURA) 2 MG tablet Take 2 mg by mouth at bedtime.  12/28/17  Yes [provider]  esomeprazole (NEXIUM) 40 MG capsule TAKE 1 CAPSULE BY MOUTH EVERY DAY Patient taking differently: 80 mg daily. 05/13/21  Yes Nafziger, Tommi Rumps, NP  glipiZIDE (GLUCOTROL XL) 5 MG 24 hr tablet TAKE 1 TABLET BY MOUTH EVERY DAY Patient taking differently: 5 mg daily. 05/28/21  Yes Nafziger, Tommi Rumps, NP  hydrocortisone (ANUSOL-HC) 2.5 % rectal cream PLACE 1 APPLICATION RECTALLY 2 TIMES A DAY. Patient taking differently: Place 1 application rectally 2 (two) times daily. 03/31/21  Yes Burchette, Alinda Sierras, MD  isosorbide-hydrALAZINE (BIDIL) 20-37.5 MG tablet Take 1 tablet by mouth 3 (three) times daily.   Yes [provider]  KLOR-CON M10 10 MEQ tablet Take 2 tablets (20 mEq total) by mouth 2 (two) times daily. Patient taking differently: Take 10 mEq by mouth 2 (two) times daily. 09/20/20 08/28/21 Yes Harold Hedge, MD  lactulose (Bowman) 10 GM/15ML solution TAKE 15 ML BY MOUTH 2 TIMES DAILY AS NEEDED FOR MILD CONSTIPATION. Patient taking differently: Take 10 g by mouth 2 (two) times daily as needed for mild constipation. 03/18/21  Yes Nafziger, Tommi Rumps, NP  metoprolol succinate (TOPROL-XL) 100 MG 24 hr tablet Take 1 tablet (100 mg total) by mouth daily. Take with or immediately following a meal. 09/21/20 08/28/21 Yes Harold Hedge, MD  polyethylene glycol (MIRALAX / GLYCOLAX) 17 g packet Take 17 g by mouth daily as needed for mild constipation.   Yes [provider]  Propylene Glycol (SYSTANE BALANCE OP) Place 1 drop into both eyes daily as needed (dry eyes).   Yes [provider]  torsemide (DEMADEX) 20 MG tablet Take 1 tablet (20 mg total) by mouth 2 (two) times daily. Patient taking differently: Take 20 mg by mouth in the morning, at noon, and at  bedtime. 09/20/20 08/28/21 Yes Harold Hedge, MD  triamcinolone cream (KENALOG) 0.1 % Apply 1 application topically daily as needed (dry/irritated skin). 07/05/19  Yes [provider]  ACCU-CHEK GUIDE test strip USED TO CHECK BLOOD GLUCOSE TWICE A DAY OR AS NEEDED 10/20/20   Nafziger, Tommi Rumps, NP  Accu-Chek Softclix Lancets lancets USED TO CHECK BLOOD GLUCOSE TWICE A DAY OR AS NEEDED 12/25/20   Nafziger, Tommi Rumps, NP  Blood Glucose Monitoring Suppl (ACCU-CHEK AVIVA PLUS) w/Device KIT Used to check blood glucose 2 times a day or PRN 09/04/19   Nafziger, Tommi Rumps, NP  predniSONE (DELTASONE) 10 MG tablet Take 4 tabs by mouth daily x 2 days; then 2 tabs by mouth daily x 2 days; then 1 tab by mouth daily x 2 days Patient not taking: Reported on 07/09/2021 04/06/21  Martyn Ehrich, NP     Vitals:   07/10/21 0700 07/10/21 0741 07/10/21 1144 07/10/21 1620  BP: (!) 160/63  (!) 159/68 139/67  Pulse: 65  65 66  Resp: _0 Temp:  99.8 F (37.7 C) 98.1 F (36.7 C) 99 F (37.2 C)  TempSrc:  Oral Oral Oral  SpO2: 95%  90% 100%  Weight:      Height:       Exam Gen alert, no distress No rash, cyanosis or gangrene Sclera anicteric, throat clear  No jvd or bruits Chest bilat rales 1/3 up, no wheezing RRR no MRG Abd soft ntnd no mass or ascites +bs GU condom cath draining clear yellow urine MS no joint effusions or deformity Ext 2-3+ dense pretib and peda edema bilat,  no wounds or ulcers Neuro is alert, Ox 3 , nf       Home meds include - norvasc 10, lipitor, catapres 0.3 tid, plavix, cardura 2 mg hs, nesium, glipizide, bidil 1 tid, klor-con 10 bid, lactulose, toprol xl 100 qd, demadex 4m tid, pred taper, prns/ vits/ supps     UA - pending      Date   Creat  eGFR    2007- 2017  1.25- 1.85    2018- 19  1.45- 2.49    2020   2.59- 3.46    2021   2.92- 3.59    Jan -feb 2022 3.51- 6.24 8- 16 ml/min     Oct    3.70- 4.07 12- 15 ml/min, stage V    Nov 17  4.02    Nov 18  4.04    Jul 10, 2021 4.52     CXR 11/18 - IMPRESSION: Cardiomegaly with vascular congestion and edema. No focal consolidation.   Assessment/ Plan: CKD V - with b/l creat 3.7- 4.0 from Oct 2022, eGFR 12-15 ml/min. Pt admitted for volume overload w/ pulm edema and LE edema. Diuresing well on IV lasix 60 bid, 3.1 L UOP in 1st 24 hrs here. Creat up today, to be expected. No signs or uremia, no indication for RRT at this time. However his renal function I believe is worse than he thinks (he thought about 18- 20 gfr, when really he is 12-15 range). Pt was thinking about doing peritoneal dialysis. Will get UA, urine lytes. No other suggestions, will follow.  Volume overload/ pulm edema - by CXR HTN - cont home meds DM2 - per pmd      RKelly Splinter MD 07/10/2021, 6:36 PM  Recent Labs  Lab 07/09/21 0333 07/10/21 0246  WBC 9.2 10.0  HGB 7.9* 8.3*   Recent Labs  Lab 07/09/21 0908 07/10/21 0246  K 3.3* 3.7  BUN 46* 48*  CREATININE 4.04* 4.52*  CALCIUM 8.7* 8.5*

## 2021-07-10 NOTE — Progress Notes (Signed)
Patient called due to respiratory distress. His O2 was as low as 87 on 5 liters with HR in low 120's. He had an order for BiPap but it wasn't placed because he was sustaining O2 Stats greater than 95% with no distress. RT was called to place on BiPAP.  After assessing the patient, RT feels it's more edema related. His Sats have returned to 100% on the BiPap and his HR is below 100. He is no longer showing signs of distress. Covering physician MD Shalhoub was notified of the recent change in the patient's status. Will continue to monitor for patient safety and comfort.

## 2021-07-10 NOTE — Progress Notes (Signed)
  Echocardiogram 2D Echocardiogram has been performed.  Austin [name] 07/10/2021, 10:41 AM

## 2021-07-10 NOTE — Plan of Care (Signed)
Removed off the BiPAP this morning.

## 2021-07-11 DIAGNOSIS — N185 Chronic kidney disease, stage 5: Secondary | ICD-10-CM | POA: Diagnosis not present

## 2021-07-11 DIAGNOSIS — E1122 Type 2 diabetes mellitus with diabetic chronic kidney disease: Secondary | ICD-10-CM | POA: Diagnosis not present

## 2021-07-11 LAB — GLUCOSE, CAPILLARY
Glucose-Capillary: 131 mg/dL — ABNORMAL HIGH (ref 70–99)
Glucose-Capillary: 143 mg/dL — ABNORMAL HIGH (ref 70–99)
Glucose-Capillary: 168 mg/dL — ABNORMAL HIGH (ref 70–99)

## 2021-07-11 LAB — TYPE AND SCREEN
ABO/RH(D): O POS
Antibody Screen: NEGATIVE

## 2021-07-11 LAB — CBC
HCT: 22 % — ABNORMAL LOW (ref 39.0–52.0)
Hemoglobin: 6.9 g/dL — CL (ref 13.0–17.0)
MCH: 26.3 pg (ref 26.0–34.0)
MCHC: 31.4 g/dL (ref 30.0–36.0)
MCV: 84 fL (ref 80.0–100.0)
Platelets: 189 10*3/uL (ref 150–400)
RBC: 2.62 MIL/uL — ABNORMAL LOW (ref 4.22–5.81)
RDW: 15.1 % (ref 11.5–15.5)
WBC: 6.8 10*3/uL (ref 4.0–10.5)
nRBC: 0 % (ref 0.0–0.2)

## 2021-07-11 LAB — BASIC METABOLIC PANEL
Anion gap: 8 (ref 5–15)
BUN: 55 mg/dL — ABNORMAL HIGH (ref 8–23)
CO2: 21 mmol/L — ABNORMAL LOW (ref 22–32)
Calcium: 8.1 mg/dL — ABNORMAL LOW (ref 8.9–10.3)
Chloride: 113 mmol/L — ABNORMAL HIGH (ref 98–111)
Creatinine, Ser: 4.77 mg/dL — ABNORMAL HIGH (ref 0.61–1.24)
GFR, Estimated: 11 mL/min — ABNORMAL LOW (ref >60)
Glucose, Bld: 144 mg/dL — ABNORMAL HIGH (ref 70–99)
Potassium: 3.7 mmol/L (ref 3.5–5.1)
Sodium: 142 mmol/L (ref 135–145)

## 2021-07-11 LAB — HEMOGLOBIN AND HEMATOCRIT, BLOOD
HCT: 22.5 % — ABNORMAL LOW (ref 39.0–52.0)
Hemoglobin: 7.3 g/dL — ABNORMAL LOW (ref 13.0–17.0)

## 2021-07-11 MED ORDER — LACTULOSE 10 GM/15ML PO SOLN
10.0000 g | Freq: Two times a day (BID) | ORAL | Status: DC | PRN
  Administered 2021-07-11: 10 g via ORAL
  Filled 2021-07-11: qty 15

## 2021-07-11 MED ORDER — BISACODYL 10 MG RE SUPP
10.0000 mg | Freq: Once | RECTAL | Status: AC
  Administered 2021-07-11: 10 mg via RECTAL
  Filled 2021-07-11: qty 1

## 2021-07-11 NOTE — Progress Notes (Signed)
Received a call from lab reporting a critical hemoglobin of 6.9.   Paged covering provider, Md. Shalhoub to inform him of the critical lab value.   Awaiting for a reply or additional orders.  Patient is asymptomatic.

## 2021-07-11 NOTE — Progress Notes (Addendum)
HOSPITAL MEDICINE OVERNIGHT EVENT NOTE    Notified by nursing that hgb is 6.9.  This is a 1.4 gram drop compared to 8.3 one day go with no signs of bleeding per nursing.  Patient is currently on BiPAP and is actively being diuresed which would lead me to believe hemoglobin should be going up via hemoconcentration.    Will obtain stat repeat H/H before pursuing transfusion as transfusing the patient in cardiogenic volume overload could prove problematic.  Austin Emerald  MD [facility]   ADDENDUM 11/20 720am  Repeat Hgb 7.3. No transfusion for now.  [name]

## 2021-07-11 NOTE — Plan of Care (Signed)
  Problem: Education: Goal: Ability to demonstrate management of disease process will improve Outcome: Progressing Goal: Ability to verbalize understanding of medication therapies will improve Outcome: Progressing   Problem: Cardiac: Goal: Ability to achieve and maintain adequate cardiopulmonary perfusion will improve Outcome: Progressing   

## 2021-07-11 NOTE — Progress Notes (Signed)
Progress Note    Austin [name]  JJO:[id number] DOB: 1934-12-14  DOA: 07/08/2021 PCP: Dorothyann Peng, NP    Brief Narrative:     Medical records reviewed and are as summarized below:  Austin [name] is an 86 y.o. male with medical history significant for type 2 diabetes mellitus, resistant hypertension, CKD IV or V, CHF, chronic cough, and pulmonary nodule, now presenting to the emergency department for evaluation of shortness of breath.  Patient reports a few days of progressive dyspnea and increased bilateral leg swelling despite recent increase in torsemide.  Renal consult to help with diuresis and rising Cr.     Assessment/Plan:   Principal Problem:   Diabetes mellitus with renal complications (HCC) Active Problems:   Hypertensive urgency   Elevated troponin   Hypokalemia   Pulmonary nodule 1 cm or greater in diameter   Acute on chronic diastolic CHF (congestive heart failure) (HCC)   CKD (chronic kidney disease), stage V (HCC)   Acute on chronic diastolic CHF; acute hypoxic respiratory failure  - Pt with CHF (EF 50-55% in July 2021) p/w progressive SOB and leg swelling despite a few days of increased torsemide and is found to be in acute CHF with hypoxia  - Continue diuresis with Lasix 60 mg IV q12h, -follow daily wt and I/Os, monitor renal function and electrolytes  -echo shows EF preserved and grade 1 diastolic dfxn -renal consult appreciated   Hypertensive urgency    - BP as high as 209/95 in ED  - improved with diuresis- continue to monitor   Low-grade fever -resolved  CKD V  - SCr is 4.02 in ED, up from 3.70 one month earlier  - Renal consult   Type II DM  - A1c was 6.8% in January 2022  - Check CBGs and use low-intensity SSI     Hypokalemia  -repleted  Elevated troponin  - HS troponin is 49 without chest pain  - Likely related to severe HTN and respiratory distress rather than ACS    Pulmonary nodule  - Followed by pulmonology with plan for repeat CT  in April 2023     constipation -bowel regimen -suppository  Anemia -prob chronic from renal disease -check stools Denies blood in stools Transfuse for <7    Family Communication/Anticipated D/C date and plan/Code Status   DVT prophylaxis: heparin Code Status: Full Code.   Disposition Plan: Status is: Inpatient  Remains inpatient appropriate because: needs improvement  in kidneys         Medical Consultants:   renal  Subjective:   Getting echo, no CP  Objective:    Vitals:   07/11/21 0411 07/11/21 0500 07/11/21 0700 07/11/21 1100  BP: (!) 144/64  (!) 151/65 (!) 159/72  Pulse:   (!) 56 (!) 58  Resp: 18  18 17   Temp: 98.4 F (36.9 C)  98.3 F (36.8 C) 98 F (36.7 C)  TempSrc: Oral  Oral Oral  SpO2: 100%  100% 98%  Weight: 84.2 kg 84.2 kg    Height:        Intake/Output Summary (Last 24 hours) at 07/11/2021 1237 Last data filed at 07/11/2021 1131 Gross per 24 hour  Intake 1380 ml  Output 1625 ml  Net -245 ml   Filed Weights   07/10/21 0457 07/11/21 0411 07/11/21 0500  Weight: 82.8 kg 84.2 kg 84.2 kg    Exam:  General: Appearance:     Overweight male in no acute distress     Lungs:  respirations unlabored, diminished at bases, on [place]  Heart:    Bradycardic.  MS:   All extremities are intact. + edema   Neurologic:   Awake, alert, oriented x 3     Data Reviewed:   I have personally reviewed following labs and imaging studies:  Labs: Labs show the following:   Basic Metabolic Panel: Recent Labs  Lab 07/08/21 2358 07/09/21 0333 07/09/21 0908 07/10/21 0246 07/11/21 0235  NA 144  --  143 142 142  K 3.3*  --  3.3* 3.7 3.7  CL 113*  --  114* 112* 113*  CO2 18*  --  19* 20* 21*  GLUCOSE 192*  --  162* 151* 144*  BUN 45*  --  46* 48* 55*  CREATININE 4.02*  --  4.04* 4.52* 4.77*  CALCIUM 9.0  --  8.7* 8.5* 8.1*  MG  --  2.0  --   --   --    GFR Estimated Creatinine Clearance: 12 mL/min (A) (by C-G formula based on SCr of 4.77  mg/dL (H)). Liver Function Tests: No results for input(s): AST, ALT, ALKPHOS, BILITOT, PROT, ALBUMIN in the last 168 hours. No results for input(s): LIPASE, AMYLASE in the last 168 hours. No results for input(s): AMMONIA in the last 168 hours. Coagulation profile No results for input(s): INR, PROTIME in the last 168 hours.  CBC: Recent Labs  Lab 07/08/21 2358 07/09/21 0333 07/10/21 0246 07/11/21 0235 07/11/21 0608  WBC 12.0* 9.2 10.0 6.8  --   NEUTROABS 10.6*  --   --   --   --   HGB 9.0* 7.9* 8.3* 6.9* 7.3*  HCT 28.6* 25.0* 26.5* 22.0* 22.5*  MCV 83.4 83.3 82.8 84.0  --   PLT 297 270 180 189  --    Cardiac Enzymes: No results for input(s): CKTOTAL, CKMB, CKMBINDEX, TROPONINI in the last 168 hours. BNP (last 3 results) Recent Labs    06/11/21 1057  PROBNP 1,890.0*   CBG: Recent Labs  Lab 07/10/21 0553 07/10/21 1200 07/10/21 1559 07/10/21 2106 07/11/21 0607  GLUCAP 135* 178* 117* 194* 131*   D-Dimer: No results for input(s): DDIMER in the last 72 hours. Hgb A1c: Recent Labs    07/09/21 0333  HGBA1C 6.3*   Lipid Profile: No results for input(s): CHOL, HDL, LDLCALC, TRIG, CHOLHDL, LDLDIRECT in the last 72 hours. Thyroid function studies: No results for input(s): TSH, T4TOTAL, T3FREE, THYROIDAB in the last 72 hours.  Invalid input(s): FREET3 Anemia work up: No results for input(s): VITAMINB12, FOLATE, FERRITIN, TIBC, IRON, RETICCTPCT in the last 72 hours. Sepsis Labs: Recent Labs  Lab 07/08/21 2358 07/09/21 0333 07/10/21 0246 07/11/21 0235  WBC 12.0* 9.2 10.0 6.8    Microbiology Recent Results (from the past 240 hour(s))  Resp Panel by RT-PCR (Flu A&B, Covid) Nasopharyngeal Swab     Status: None   Collection Time: 07/08/21 11:53 PM   Specimen: Nasopharyngeal Swab; Nasopharyngeal(NP) swabs in vial transport medium  Result Value Ref Range Status   SARS Coronavirus 2 by RT PCR NEGATIVE NEGATIVE Final    Comment: (NOTE) SARS-CoV-2 target nucleic  acids are NOT DETECTED.  The SARS-CoV-2 RNA is generally detectable in upper respiratory specimens during the acute phase of infection. The lowest concentration of SARS-CoV-2 viral copies this assay can detect is 138 copies/mL. A negative result does not preclude SARS-Cov-2 infection and should not be used as the sole basis for treatment or other patient management decisions. A negative result may occur with  improper specimen collection/handling, submission of specimen other than nasopharyngeal swab, presence of viral mutation(s) within the areas targeted by this assay, and inadequate number of viral copies(<138 copies/mL). A negative result must be combined with clinical observations, patient history, and epidemiological information. The expected result is Negative.  Fact Sheet for Patients:  EntrepreneurPulse.com.au  Fact Sheet for Healthcare Providers:  IncredibleEmployment.be  This test is no t yet approved or cleared by the [place] FDA and  has been authorized for detection and/or diagnosis of SARS-CoV-2 by FDA under an Emergency Use Authorization (EUA). This EUA will remain  in effect (meaning this test can be used) for the duration of the COVID-19 declaration under Section 564(b)(1) of the Act, 21 U.S.C.section 360bbb-3(b)(1), unless the authorization is terminated  or revoked sooner.       Influenza A by PCR NEGATIVE NEGATIVE Final   Influenza B by PCR NEGATIVE NEGATIVE Final    Comment: (NOTE) The Xpert Xpress SARS-CoV-2/FLU/RSV plus assay is intended as an aid in the diagnosis of influenza from Nasopharyngeal swab specimens and should not be used as a sole basis for treatment. Nasal washings and aspirates are unacceptable for Xpert Xpress SARS-CoV-2/FLU/RSV testing.  Fact Sheet for Patients: EntrepreneurPulse.com.au  Fact Sheet for Healthcare Providers: IncredibleEmployment.be  This  test is not yet approved or cleared by the [place] FDA and has been authorized for detection and/or diagnosis of SARS-CoV-2 by FDA under an Emergency Use Authorization (EUA). This EUA will remain in effect (meaning this test can be used) for the duration of the COVID-19 declaration under Section 564(b)(1) of the Act, 21 U.S.C. section 360bbb-3(b)(1), unless the authorization is terminated or revoked.  Performed at [facility], [place] [address] [place], [place] [address]   Respiratory (~20 pathogens) panel by PCR     Status: None   Collection Time: 07/10/21 10:20 AM   Specimen: Nasopharyngeal Swab; Respiratory  Result Value Ref Range Status   Adenovirus NOT DETECTED NOT DETECTED Final   Coronavirus 229E NOT DETECTED NOT DETECTED Final    Comment: (NOTE) The Coronavirus on the Respiratory Panel, DOES NOT test for the novel  Coronavirus (2019 nCoV)    Coronavirus HKU1 NOT DETECTED NOT DETECTED Final   Coronavirus NL63 NOT DETECTED NOT DETECTED Final   Coronavirus OC43 NOT DETECTED NOT DETECTED Final   Metapneumovirus NOT DETECTED NOT DETECTED Final   Rhinovirus / Enterovirus NOT DETECTED NOT DETECTED Final   Influenza A NOT DETECTED NOT DETECTED Final   Influenza B NOT DETECTED NOT DETECTED Final   Parainfluenza Virus 1 NOT DETECTED NOT DETECTED Final   Parainfluenza Virus 2 NOT DETECTED NOT DETECTED Final   Parainfluenza Virus 3 NOT DETECTED NOT DETECTED Final   Parainfluenza Virus 4 NOT DETECTED NOT DETECTED Final   Respiratory Syncytial Virus NOT DETECTED NOT DETECTED Final   Bordetella pertussis NOT DETECTED NOT DETECTED Final   Bordetella Parapertussis NOT DETECTED NOT DETECTED Final   Chlamydophila pneumoniae NOT DETECTED NOT DETECTED Final   Mycoplasma pneumoniae NOT DETECTED NOT DETECTED Final    Comment: Performed at [facility], [place]. [address] [place], [place] [address]    Procedures and diagnostic studies:  ECHOCARDIOGRAM COMPLETE  Result Date:  07/10/2021    ECHOCARDIOGRAM REPORT   Patient Name:   Austin [name] Date of Exam: 07/10/2021 Medical Rec #:  [id number]  Height:       67.0 in Accession #:    [id number] Weight:       182.5 lb Date  of Birth:  [birth year]  BSA:          1.945 m Patient Age:    79 years   BP:           160/63 mmHg Patient Gender: M          HR:           87 bpm. Exam Location:  Inpatient Procedure: 2D Echo, 3D Echo, Cardiac Doppler and Color Doppler Indications:    I50.40* Unspecified combined systolic (congestive) and diastolic                 (congestive) heart failure  History:        Patient has prior history of Echocardiogram examinations, most                 recent 02/25/2020. CHF, CAD, Aortic Valve Disease,                 Signs/Symptoms:Dyspnea and Chest Pain; Risk                 Factors:Dyslipidemia, Hypertension and Diabetes. Cancer.  Sonographer:    Roseanna Rainbow RDCS Referring Phys: [id number] [place]  1. Left ventricular ejection fraction, by estimation, is 55 to 60%. The left ventricle has normal function. The left ventricle has no regional wall motion abnormalities. There is moderate left ventricular hypertrophy. Left ventricular diastolic parameters are consistent with Grade I diastolic dysfunction (impaired relaxation). Elevated left atrial pressure.  2. Right ventricular systolic function is normal. The right ventricular size is normal. There is severely elevated pulmonary artery systolic pressure.  3. Left atrial size was mildly dilated.  4. Moderate pleural effusion in the left lateral region.  5. The mitral valve is abnormal. Mild mitral valve regurgitation. No evidence of mitral stenosis.  6. The tricuspid valve is abnormal. Tricuspid valve regurgitation is moderate.  7. The aortic valve is tricuspid. There is mild calcification of the aortic valve. There is mild thickening of the aortic valve. Aortic valve regurgitation is moderate. No aortic stenosis is present.  8. Aortic dilatation noted. There is  mild dilatation of the ascending aorta, measuring 38 mm.  9. The inferior vena cava is dilated in size with <50% respiratory variability, suggesting right atrial pressure of 15 mmHg. FINDINGS  Left Ventricle: Left ventricular ejection fraction, by estimation, is 55 to 60%. The left ventricle has normal function. The left ventricle has no regional wall motion abnormalities. The left ventricular internal cavity size was normal in size. There is  moderate left ventricular hypertrophy. Left ventricular diastolic parameters are consistent with Grade I diastolic dysfunction (impaired relaxation). Elevated left atrial pressure. Right Ventricle: The right ventricular size is normal. No increase in right ventricular wall thickness. Right ventricular systolic function is normal. There is severely elevated pulmonary artery systolic pressure. The tricuspid regurgitant velocity is 3.43 m/s, and with an assumed right atrial pressure of 15 mmHg, the estimated right ventricular systolic pressure is 88.5 mmHg. Left Atrium: Left atrial size was mildly dilated. Right Atrium: Right atrial size was normal in size. Pericardium: There is no evidence of pericardial effusion. Mitral Valve: The mitral valve is abnormal. There is mild thickening of the mitral valve leaflet(s). There is mild calcification of the mitral valve leaflet(s). Mild mitral annular calcification. Mild mitral valve regurgitation. No evidence of mitral valve stenosis. Tricuspid Valve: The tricuspid valve is abnormal. Tricuspid valve regurgitation is moderate . No evidence of tricuspid stenosis. Aortic Valve: The aortic valve is  tricuspid. There is mild calcification of the aortic valve. There is mild thickening of the aortic valve. There is mild aortic valve annular calcification. Aortic valve regurgitation is moderate. Aortic regurgitation PHT  measures 637 msec. No aortic stenosis is present. Aortic valve mean gradient measures 6.0 mmHg. Aortic valve peak gradient  measures 11.4 mmHg. Aortic valve area, by VTI measures 2.81 cm. Pulmonic Valve: The pulmonic valve was not well visualized. Pulmonic valve regurgitation is not visualized. No evidence of pulmonic stenosis. Aorta: Aortic dilatation noted. There is mild dilatation of the ascending aorta, measuring 38 mm. Venous: The inferior vena cava is dilated in size with less than 50% respiratory variability, suggesting right atrial pressure of 15 mmHg. IAS/Shunts: No atrial level shunt detected by color flow Doppler. Additional Comments: There is a moderate pleural effusion in the left lateral region.  LEFT VENTRICLE PLAX 2D LVIDd:         5.70 cm      Diastology LVIDs:         4.00 cm      LV e' medial:    5.44 cm/s LV PW:         1.40 cm      LV E/e' medial:  20.6 LV IVS:        1.20 cm      LV e' lateral:   8.70 cm/s LVOT diam:     2.00 cm      LV E/e' lateral: 12.9 LV SV:         94 LV SV Index:   48 LVOT Area:     3.14 cm                              3D Volume EF: LV Volumes (MOD)            3D EF:        51 % LV vol d, MOD A2C: 165.0 ml LV EDV:       212 ml LV vol s, MOD A2C: 79.3 ml  LV ESV:       103 ml LV SV MOD A2C:     85.7 ml  LV SV:        109 ml RIGHT VENTRICLE             IVC RV S prime:     16.80 cm/s  IVC diam: 2.70 cm TAPSE (M-mode): 2.1 cm LEFT ATRIUM             Index        RIGHT ATRIUM           Index LA diam:        4.50 cm 2.31 cm/m   RA Area:     19.60 cm LA Vol (A2C):   74.3 ml 38.20 ml/m  RA Volume:   56.40 ml  28.99 ml/m LA Vol (A4C):   68.9 ml 35.42 ml/m LA Biplane Vol: 75.6 ml 38.86 ml/m  AORTIC VALVE                     PULMONIC VALVE AV Area (Vmax):    3.05 cm      PR End Diast Vel: 2.23 msec AV Area (Vmean):   2.97 cm AV Area (VTI):     2.81 cm AV Vmax:           169.00 cm/s AV Vmean:  109.000 cm/s AV VTI:            0.333 m AV Peak Grad:      11.4 mmHg AV Mean Grad:      6.0 mmHg LVOT Vmax:         164.00 cm/s LVOT Vmean:        103.000 cm/s LVOT VTI:          0.298 m LVOT/AV  VTI ratio: 0.89 AI PHT:            637 msec  AORTA Ao Root diam: 3.80 cm Ao Asc diam:  3.80 cm MITRAL VALVE                TRICUSPID VALVE MV Area (PHT): 4.21 cm     TR Peak grad:   47.1 mmHg MV Decel Time: 180 msec     TR Vmax:        343.00 cm/s MV E velocity: 112.00 cm/s MV A velocity: 68.00 cm/s   SHUNTS MV E/A ratio:  1.65         Systemic VTI:  0.30 m                             Systemic Diam: 2.00 cm Carlyle Dolly MD Electronically signed by Carlyle Dolly MD Signature Date/Time: 07/10/2021/1:15:14 PM    Final     Medications:    amLODipine  10 mg Oral Daily   atorvastatin  20 mg Oral Daily   bisacodyl  10 mg Rectal Once   cloNIDine  0.3 mg Oral TID   clopidogrel  75 mg Oral Q supper   doxazosin  2 mg Oral QHS   furosemide  60 mg Intravenous BID   heparin  5,000 Units Subcutaneous Q8H   hydrocortisone  1 application Rectal BID   insulin aspart  0-6 Units Subcutaneous TID WC   isosorbide-hydrALAZINE  1 tablet Oral TID   metoprolol succinate  100 mg Oral Daily   pantoprazole  40 mg Oral Daily   potassium chloride  10 mEq Oral BID   potassium chloride  20 mEq Oral Once   sodium chloride flush  3 mL Intravenous Q12H   Continuous Infusions:  sodium chloride       LOS: 2 days   Geradine Girt  [facility]   How to contact the [facility] Attending or Consulting provider [place] or covering provider during after hours [place], for this patient?  Check the care team in [facility] and look for a) attending/consulting [facility] provider listed and b) the [facility] team listed Log into [email or web address] and use [place]'s universal password to access. If you do not have the password, please contact the hospital operator. Locate the [facility] provider you are looking for under [facility] and page to a number that you can be directly reached. If you still have difficulty reaching the provider, please page the [facility] (Director on Call) for the Hospitalists listed on amion for assistance.  07/11/2021,  12:37 PM

## 2021-07-11 NOTE — Progress Notes (Signed)
[facility] Progress Note  Subjective: 2600 cc UOP yesterday, creat up to 4.77  Vitals:   07/10/21 2024 07/10/21 2359 07/11/21 0013 07/11/21 0411  BP: (!) 148/65  (!) 153/65 (!) 144/64  Pulse:  (!) 50    Resp: _0 Temp: 98.2 F (36.8 C)  98.5 F (36.9 C) 98.4 F (36.9 C)  TempSrc: Oral  Oral Oral  SpO2: 100% 98% 100% 100%  Weight:    84.2 kg  Height:        Exam: Gen alert, no distress No jvd or bruits Chest bilat rales 1/3 up RRR no MRG Abd soft ntnd no mass or ascites +bs GU condom cath  Ext 2-3+ dense pretib and peda edema bilat,  no wounds or ulcers Neuro is alert, Ox 3 , nf     Home meds include - norvasc 10, lipitor, catapres 0.3 tid, plavix, cardura 2 mg hs, nesium, glipizide, bidil 1 tid, klor-con 10 bid, lactulose, toprol xl 100 qd, demadex 79m tid, pred taper, prns/ vits/ supps      UA -11/19 - 100 prot, 0-5 rbc/ wbc   UNa 29, UCr 122       Date                         Creat               eGFR    2007- 2017               1.25- 1.85    2018- 19                   1.45- 2.49    2020                         2.59- 3.46    2021                         2.92- 3.59    Jan -feb 2022           3.51- 6.24        8- 16 ml/min        Oct                            3.70- 4.07        12- 15 ml/min, stage V    Nov 17                      4.02    Nov 18                      4.04    Jul 10, 2021           4.52     CXR 11/18 - IMPRESSION: Cardiomegaly with vascular congestion and edema. No focal consolidation.     Assessment/ Plan: CKD V - with b/l creat 3.7- 4.0 from Oct 2022, eGFR 12-15 ml/min. Pt admitted for volume overload w/ pulm edema and LE edema. Creat 4.0 at baseline on admission. Creat up 4.7 today which is expected w/ necessary diuresis. Urine lytes c/w diast CHF. No signs of uremia, no indication for RRT at this time. Diuresing well on IV lasix 60 bid. Cont diuresis.  Volume overload/ pulm edema - by CXR on admission  HTN - cont home  meds DM2 - per pmd       Rob Gavinn Collard 07/11/2021, 5:23 AM   Recent Labs  Lab 07/10/21 0246 07/11/21 0235  K 3.7 3.7  BUN 48* 55*  CREATININE 4.52* 4.77*  CALCIUM 8.5* 8.1*  HGB 8.3* 6.9*   Inpatient medications:  amLODipine  10 mg Oral Daily   atorvastatin  20 mg Oral Daily   cloNIDine  0.3 mg Oral TID   clopidogrel  75 mg Oral Q supper   doxazosin  2 mg Oral QHS   furosemide  60 mg Intravenous BID   heparin  5,000 Units Subcutaneous Q8H   hydrocortisone  1 application Rectal BID   insulin aspart  0-6 Units Subcutaneous TID WC   isosorbide-hydrALAZINE  1 tablet Oral TID   metoprolol succinate  100 mg Oral Daily   pantoprazole  40 mg Oral Daily   potassium chloride  10 mEq Oral BID   potassium chloride  20 mEq Oral Once   sodium chloride flush  3 mL Intravenous Q12H    sodium chloride     sodium chloride, acetaminophen, albuterol, hydrALAZINE, ondansetron (ZOFRAN) IV, sodium chloride flush

## 2021-07-11 NOTE — Evaluation (Signed)
Physical Therapy Evaluation Patient Details Name: Austin [name] MRN: [id number] DOB: December 16, 1934 Today's Date: 07/11/2021  History of Present Illness  The pt is an 86 yo male presenting 11/18 with SOB and LE swelling. Upon work-up, suspect HTN emergency with CHF exacerbation. PMH includes: CHF, HTN, DM II, and CKD V.   Clinical Impression  Pt in bed upon arrival of PT, agreeable to evaluation at this time. Prior to admission the pt was completely independent with all mobility without need for DME or any assist for IADLs. The pt now presents with limitations in functional mobility, dynamic stability, and activity tolerance due to above dx, and will continue to benefit from skilled PT to address these deficits. The pt was able to complete multiple sit-stand transfers from recliner with minG for safety but no physical assist, and was able to complete ~50 ft hallway ambulation without AD or LOB. The pt was on 5L O2 upon my arrival, but tolerated ambulation on 3L with SpO2 >95%. The pt was left on 3L at this time with all needs met. Will continue to benefit from skilled PT to progress activity tolerance and improve stability with gait to facilitate return to full independence.         Recommendations for follow up therapy are one component of a multi-disciplinary discharge planning process, led by the attending physician.  Recommendations may be updated based on patient status, additional functional criteria and insurance authorization.  Follow Up Recommendations Home health PT    Assistance Recommended at Discharge Frequent or constant Supervision/Assistance  Functional Status Assessment Patient has had a recent decline in their functional status and demonstrates the ability to make significant improvements in function in a reasonable and predictable amount of time.  Equipment Recommendations  BSC/3in1;Cane    Recommendations for Other Services       Precautions / Restrictions  Precautions Precautions: Fall Precaution Comments: watch SpO2 Restrictions Weight Bearing Restrictions: No      Mobility  Bed Mobility               General bed mobility comments: pt OOB at start and end of session    Transfers Overall transfer level: Needs assistance Equipment used: None Transfers: Sit to/from Stand Sit to Stand: Min guard           General transfer comment: minG for safety, pt states he has been getting out of chair to [facility] independently    Ambulation/Gait Ambulation/Gait assistance: Min assist;Min guard Gait Distance (Feet): 50 Feet Assistive device: None Gait Pattern/deviations: Decreased stance time - left;Drifts right/left Gait velocity: decreased but improved through session Gait velocity interpretation: <1.31 ft/sec, indicative of household ambulator   General Gait Details: pt with lateral instability, but able to complete with slowed steps and no UE support.      Balance Overall balance assessment: Mild deficits observed, not formally tested                                           Pertinent Vitals/Pain Pain Assessment: No/denies pain    Home Living Family/patient expects to be discharged to:: Private residence Living Arrangements: Spouse/significant other Available Help at Discharge: Family;Available 24 hours/day Type of Home: House Home Access: Stairs to enter Entrance Stairs-Rails: Left Entrance Stairs-Number of Steps: 4 from the garage Alternate Level Stairs-Number of Steps: flight Home Layout: Multi-level;Bed/bath upstairs;1/2 bath on main level Home Equipment: None  Prior Function Prior Level of Function : Independent/Modified Independent;Driving                     Hand Dominance   Dominant Hand: Right    Extremity/Trunk Assessment   Upper Extremity Assessment Upper Extremity Assessment: Overall WFL for tasks assessed    Lower Extremity Assessment Lower Extremity Assessment:  RLE deficits/detail;LLE deficits/detail RLE Deficits / Details: grossly 4/5, pitting edema below knee. pt reports it has been like this x1 month RLE Sensation: WNL RLE Coordination: WNL LLE Deficits / Details: grossly 4/5, pitting edema below knee. pt reports it has been like this x1 month LLE Sensation: WNL LLE Coordination: WNL    Cervical / Trunk Assessment Cervical / Trunk Assessment: Normal  Communication   Communication: No difficulties  Cognition Arousal/Alertness: Awake/alert Behavior During Therapy: WFL for tasks assessed/performed Overall Cognitive Status: Within Functional Limits for tasks assessed                                 General Comments: able to follow all cues and commands, pleasant        General Comments General comments (skin integrity, edema, etc.): VSS on 3L with ambulation, Spo2 97-98% at rest on 3L after session    Exercises     Assessment/Plan    PT Assessment Patient needs continued PT services  PT Problem List Decreased strength;Decreased range of motion;Decreased activity tolerance;Decreased balance;Decreased mobility;Cardiopulmonary status limiting activity       PT Treatment Interventions DME instruction;Gait training;Functional mobility training;Stair training;Therapeutic activities;Therapeutic exercise;Balance training;Patient/family education    PT Goals (Current goals can be found in the Care Plan section)  Acute Rehab PT Goals Patient Stated Goal: return to full independence PT Goal Formulation: With patient Time For Goal Achievement: 07/25/21 Potential to Achieve Goals: Good    Frequency Min 3X/week    AM-PAC PT "6 Clicks" Mobility  Outcome Measure Help needed turning from your back to your side while in a flat bed without using bedrails?: A Little Help needed moving from lying on your back to sitting on the side of a flat bed without using bedrails?: A Little Help needed moving to and from a bed to a chair  (including a wheelchair)?: A Little Help needed standing up from a chair using your arms (e.g., wheelchair or bedside chair)?: A Little Help needed to walk in hospital room?: A Little Help needed climbing 3-5 steps with a railing? : A Little 6 Click Score: 18    End of Session Equipment Utilized During Treatment: Gait belt;Oxygen Activity Tolerance: Patient tolerated treatment well;No increased pain Patient left: in chair;with call bell/phone within reach;with family/visitor present Nurse Communication: Mobility status (decreased O2) PT Visit Diagnosis: Unsteadiness on feet (R26.81);Other abnormalities of gait and mobility (R26.89)    Time: 0211-1735 PT Time Calculation (min) (ACUTE ONLY): 31 min   Charges:   PT Evaluation $PT Eval Low Complexity: 1 Low PT Treatments $Gait Training: 8-22 mins        West Carbo, PT, DPT   Acute Rehabilitation Department Pager #: [phone]  Sandra Cockayne 07/11/2021, 5:06 PM

## 2021-07-11 NOTE — Progress Notes (Signed)
Received placed for [employer] [facility] [employer] [facility]. Will notify  covering MD will the results return.

## 2021-07-12 DIAGNOSIS — E1122 Type 2 diabetes mellitus with diabetic chronic kidney disease: Secondary | ICD-10-CM | POA: Diagnosis not present

## 2021-07-12 DIAGNOSIS — I5033 Acute on chronic diastolic (congestive) heart failure: Secondary | ICD-10-CM | POA: Diagnosis not present

## 2021-07-12 DIAGNOSIS — R778 Other specified abnormalities of plasma proteins: Secondary | ICD-10-CM | POA: Diagnosis not present

## 2021-07-12 DIAGNOSIS — N185 Chronic kidney disease, stage 5: Secondary | ICD-10-CM | POA: Diagnosis not present

## 2021-07-12 LAB — BASIC METABOLIC PANEL
Anion gap: 9 (ref 5–15)
BUN: 60 mg/dL — ABNORMAL HIGH (ref 8–23)
CO2: 21 mmol/L — ABNORMAL LOW (ref 22–32)
Calcium: 8.3 mg/dL — ABNORMAL LOW (ref 8.9–10.3)
Chloride: 112 mmol/L — ABNORMAL HIGH (ref 98–111)
Creatinine, Ser: 4.86 mg/dL — ABNORMAL HIGH (ref 0.61–1.24)
GFR, Estimated: 11 mL/min — ABNORMAL LOW (ref >60)
Glucose, Bld: 151 mg/dL — ABNORMAL HIGH (ref 70–99)
Potassium: 3.6 mmol/L (ref 3.5–5.1)
Sodium: 142 mmol/L (ref 135–145)

## 2021-07-12 LAB — CBC
HCT: 22.1 % — ABNORMAL LOW (ref 39.0–52.0)
Hemoglobin: 7.1 g/dL — ABNORMAL LOW (ref 13.0–17.0)
MCH: 27 pg (ref 26.0–34.0)
MCHC: 32.1 g/dL (ref 30.0–36.0)
MCV: 84 fL (ref 80.0–100.0)
Platelets: 157 10*3/uL (ref 150–400)
RBC: 2.63 MIL/uL — ABNORMAL LOW (ref 4.22–5.81)
RDW: 14.9 % (ref 11.5–15.5)
WBC: 7.6 10*3/uL (ref 4.0–10.5)
nRBC: 0 % (ref 0.0–0.2)

## 2021-07-12 LAB — GLUCOSE, CAPILLARY
Glucose-Capillary: 133 mg/dL — ABNORMAL HIGH (ref 70–99)
Glucose-Capillary: 186 mg/dL — ABNORMAL HIGH (ref 70–99)
Glucose-Capillary: 88 mg/dL (ref 70–99)
Glucose-Capillary: 201 mg/dL — ABNORMAL HIGH (ref 70–99)

## 2021-07-12 LAB — IRON AND TIBC
Iron: 13 ug/dL — ABNORMAL LOW (ref 45–182)
Saturation Ratios: 5 % — ABNORMAL LOW (ref 17.9–39.5)
TIBC: 241 ug/dL — ABNORMAL LOW (ref 250–450)
UIBC: 228 ug/dL

## 2021-07-12 LAB — FERRITIN: Ferritin: 43 ng/mL (ref 24–336)

## 2021-07-12 LAB — MAGNESIUM: Magnesium: 2.1 mg/dL (ref 1.7–2.4)

## 2021-07-12 MED ORDER — BISACODYL 10 MG RE SUPP: 10.0000 mg | Freq: Every day | RECTAL | Status: DC | PRN

## 2021-07-12 MED ORDER — METOLAZONE 5 MG PO TABS
5.0000 mg | ORAL_TABLET | Freq: Once | ORAL | Status: AC
  Administered 2021-07-12: 5 mg via ORAL
  Filled 2021-07-12: qty 1

## 2021-07-12 NOTE — Telephone Encounter (Signed)
Does pt need a 6 month f/u? Or ok to refill? Please advise

## 2021-07-12 NOTE — Progress Notes (Signed)
Mobility Specialist Progress Note:   07/12/21 1440  Mobility  Activity Ambulated in hall  Level of Assistance Contact guard assist, steadying assist  Assistive Device None  Distance Ambulated (ft) 150 ft  Mobility Ambulated with assistance in hallway  Mobility Response Tolerated well  Mobility performed by Mobility specialist  Bed Position Chair  $Mobility charge 1 Mobility   Pt received in chair willing to participate. No complaints of pain, upon returning to room patient said he was a little SOB. Pt returned to chair with call bell in reach and all needs met.   [facility] Public librarian Phone [phone] Secondary Phone [phone]

## 2021-07-12 NOTE — Progress Notes (Signed)
Progress Note    Austin [name]  NFA:[id number] DOB: [birth year]  DOA: 07/08/2021 PCP: Dorothyann Peng, NP    Brief Narrative:     Medical records reviewed and are as summarized below:  Austin [name] is an 86 y.o. male with medical history significant for type 2 diabetes mellitus, resistant hypertension, CKD IV or V, CHF, chronic cough, and pulmonary nodule, now presenting to the emergency department for evaluation of shortness of breath.  Patient reports a few days of progressive dyspnea and increased bilateral leg swelling despite recent increase in torsemide.  Renal consult to help with diuresis and rising Cr.     Assessment/Plan:   Principal Problem:   Diabetes mellitus with renal complications (HCC) Active Problems:   Hypertensive urgency   Elevated troponin   Hypokalemia   Pulmonary nodule 1 cm or greater in diameter   Acute on chronic diastolic CHF (congestive heart failure) (HCC)   CKD (chronic kidney disease), stage V (HCC)   Acute on chronic diastolic CHF; acute hypoxic respiratory failure  - Pt with CHF (EF 50-55% in July 2021) p/w progressive SOB and leg swelling despite a few days of increased torsemide and is found to be in acute CHF with hypoxia  - Continue diuresis with Lasix IV, metolazone added 11/21 -follow daily wt and I/Os, monitor renal function and electrolytes  -echo shows EF preserved and grade 1 diastolic dfxn -renal consult appreciated   Hypertensive urgency    - BP as high as 209/95 in ED  - improved with diuresis- continue to monitor   Low-grade fever -resolved  CKD V  - SCr is 4.02 in ED, up from 3.70 one month earlier  - Renal consult   Type II DM  - A1c was 6.8% in January 2022  - Check CBGs and use low-intensity SSI     Hypokalemia  -repleted  Elevated troponin  - HS troponin is 49 without chest pain  - Likely related to severe HTN and respiratory distress rather than ACS    Pulmonary nodule  - Followed by pulmonology with plan  for repeat CT in April 2023     constipation -bowel regimen -suppository  Anemia -prob chronic from renal disease -check stools Denies blood in stools Transfuse for <7    Family Communication/Anticipated D/C date and plan/Code Status   DVT prophylaxis: heparin Code Status: Full Code.   Disposition Plan: Status is: Inpatient  Remains inpatient appropriate because: needs improvement  in kidneys         Medical Consultants:   renal  Subjective:   Had BM yesterday after suppository  Objective:    Vitals:   07/12/21 0000 07/12/21 0428 07/12/21 0751 07/12/21 0918  BP: (!) 147/63 (!) 149/62 (!) 150/63   Pulse: (!) 52 66 (!) 52   Resp: 19 17    Temp: 98.1 F (36.7 C) 98.1 F (36.7 C) 98.1 F (36.7 C)   TempSrc: Oral Oral Oral   SpO2: 100% 100% 98% 96%  Weight:  83.6 kg    Height:        Intake/Output Summary (Last 24 hours) at 07/12/2021 1036 Last data filed at 07/12/2021 4696 Gross per 24 hour  Intake 1300 ml  Output 1175 ml  Net 125 ml   Filed Weights   07/11/21 0411 07/11/21 0500 07/12/21 0428  Weight: 84.2 kg 84.2 kg 83.6 kg    Exam:   General: Appearance:     Overweight male in no acute distress  Lungs:     respirations unlabored, on [place]  Heart:    Bradycardic.    MS:   All extremities are intact. + edema in LE   Neurologic:   Awake, alert, oriented x 3. No apparent focal neurological           defect.          Data Reviewed:   I have personally reviewed following labs and imaging studies:  Labs: Labs show the following:   Basic Metabolic Panel: Recent Labs  Lab 07/08/21 2358 07/09/21 0333 07/09/21 0908 07/10/21 0246 07/11/21 0235 07/12/21 0230  NA 144  --  143 142 142 142  K 3.3*  --  3.3* 3.7 3.7 3.6  CL 113*  --  114* 112* 113* 112*  CO2 18*  --  19* 20* 21* 21*  GLUCOSE 192*  --  162* 151* 144* 151*  BUN 45*  --  46* 48* 55* 60*  CREATININE 4.02*  --  4.04* 4.52* 4.77* 4.86*  CALCIUM 9.0  --  8.7* 8.5* 8.1*  8.3*  MG  --  2.0  --   --   --  2.1   GFR Estimated Creatinine Clearance: 11.7 mL/min (A) (by C-G formula based on SCr of 4.86 mg/dL (H)). Liver Function Tests: No results for input(s): AST, ALT, ALKPHOS, BILITOT, PROT, ALBUMIN in the last 168 hours. No results for input(s): LIPASE, AMYLASE in the last 168 hours. No results for input(s): AMMONIA in the last 168 hours. Coagulation profile No results for input(s): INR, PROTIME in the last 168 hours.  CBC: Recent Labs  Lab 07/08/21 2358 07/09/21 0333 07/10/21 0246 07/11/21 0235 07/11/21 0608 07/12/21 0230  WBC 12.0* 9.2 10.0 6.8  --  7.6  NEUTROABS 10.6*  --   --   --   --   --   HGB 9.0* 7.9* 8.3* 6.9* 7.3* 7.1*  HCT 28.6* 25.0* 26.5* 22.0* 22.5* 22.1*  MCV 83.4 83.3 82.8 84.0  --  84.0  PLT 297 270 180 189  --  157   Cardiac Enzymes: No results for input(s): CKTOTAL, CKMB, CKMBINDEX, TROPONINI in the last 168 hours. BNP (last 3 results) Recent Labs    06/11/21 1057  PROBNP 1,890.0*   CBG: Recent Labs  Lab 07/10/21 2106 07/11/21 0607 07/11/21 1615 07/11/21 2127 07/12/21 0544  GLUCAP 194* 131* 143* 168* 133*   D-Dimer: No results for input(s): DDIMER in the last 72 hours. Hgb A1c: No results for input(s): HGBA1C in the last 72 hours.  Lipid Profile: No results for input(s): CHOL, HDL, LDLCALC, TRIG, CHOLHDL, LDLDIRECT in the last 72 hours. Thyroid function studies: No results for input(s): TSH, T4TOTAL, T3FREE, THYROIDAB in the last 72 hours.  Invalid input(s): FREET3 Anemia work up: No results for input(s): VITAMINB12, FOLATE, FERRITIN, TIBC, IRON, RETICCTPCT in the last 72 hours. Sepsis Labs: Recent Labs  Lab 07/09/21 0333 07/10/21 0246 07/11/21 0235 07/12/21 0230  WBC 9.2 10.0 6.8 7.6    Microbiology Recent Results (from the past 240 hour(s))  Resp Panel by RT-PCR (Flu A&B, Covid) Nasopharyngeal Swab     Status: None   Collection Time: 07/08/21 11:53 PM   Specimen: Nasopharyngeal Swab;  Nasopharyngeal(NP) swabs in vial transport medium  Result Value Ref Range Status   SARS Coronavirus 2 by RT PCR NEGATIVE NEGATIVE Final    Comment: (NOTE) SARS-CoV-2 target nucleic acids are NOT DETECTED.  The SARS-CoV-2 RNA is generally detectable in upper respiratory specimens during the acute phase of  infection. The lowest concentration of SARS-CoV-2 viral copies this assay can detect is 138 copies/mL. A negative result does not preclude SARS-Cov-2 infection and should not be used as the sole basis for treatment or other patient management decisions. A negative result may occur with  improper specimen collection/handling, submission of specimen other than nasopharyngeal swab, presence of viral mutation(s) within the areas targeted by this assay, and inadequate number of viral copies(<138 copies/mL). A negative result must be combined with clinical observations, patient history, and epidemiological information. The expected result is Negative.  Fact Sheet for Patients:  EntrepreneurPulse.com.au  Fact Sheet for Healthcare Providers:  IncredibleEmployment.be  This test is no t yet approved or cleared by the [place] FDA and  has been authorized for detection and/or diagnosis of SARS-CoV-2 by FDA under an Emergency Use Authorization (EUA). This EUA will remain  in effect (meaning this test can be used) for the duration of the COVID-19 declaration under Section 564(b)(1) of the Act, 21 U.S.C.section 360bbb-3(b)(1), unless the authorization is terminated  or revoked sooner.       Influenza A by PCR NEGATIVE NEGATIVE Final   Influenza B by PCR NEGATIVE NEGATIVE Final    Comment: (NOTE) The Xpert Xpress SARS-CoV-2/FLU/RSV plus assay is intended as an aid in the diagnosis of influenza from Nasopharyngeal swab specimens and should not be used as a sole basis for treatment. Nasal washings and aspirates are unacceptable for Xpert Xpress  SARS-CoV-2/FLU/RSV testing.  Fact Sheet for Patients: EntrepreneurPulse.com.au  Fact Sheet for Healthcare Providers: IncredibleEmployment.be  This test is not yet approved or cleared by the [place] FDA and has been authorized for detection and/or diagnosis of SARS-CoV-2 by FDA under an Emergency Use Authorization (EUA). This EUA will remain in effect (meaning this test can be used) for the duration of the COVID-19 declaration under Section 564(b)(1) of the Act, 21 U.S.C. section 360bbb-3(b)(1), unless the authorization is terminated or revoked.  Performed at [facility], [place] [address], [place], [place] [address]   Respiratory (~20 pathogens) panel by PCR     Status: None   Collection Time: 07/10/21 10:20 AM   Specimen: Nasopharyngeal Swab; Respiratory  Result Value Ref Range Status   Adenovirus NOT DETECTED NOT DETECTED Final   Coronavirus 229E NOT DETECTED NOT DETECTED Final    Comment: (NOTE) The Coronavirus on the Respiratory Panel, DOES NOT test for the novel  Coronavirus (2019 nCoV)    Coronavirus HKU1 NOT DETECTED NOT DETECTED Final   Coronavirus NL63 NOT DETECTED NOT DETECTED Final   Coronavirus OC43 NOT DETECTED NOT DETECTED Final   Metapneumovirus NOT DETECTED NOT DETECTED Final   Rhinovirus / Enterovirus NOT DETECTED NOT DETECTED Final   Influenza A NOT DETECTED NOT DETECTED Final   Influenza B NOT DETECTED NOT DETECTED Final   Parainfluenza Virus 1 NOT DETECTED NOT DETECTED Final   Parainfluenza Virus 2 NOT DETECTED NOT DETECTED Final   Parainfluenza Virus 3 NOT DETECTED NOT DETECTED Final   Parainfluenza Virus 4 NOT DETECTED NOT DETECTED Final   Respiratory Syncytial Virus NOT DETECTED NOT DETECTED Final   Bordetella pertussis NOT DETECTED NOT DETECTED Final   Bordetella Parapertussis NOT DETECTED NOT DETECTED Final   Chlamydophila pneumoniae NOT DETECTED NOT DETECTED Final   Mycoplasma pneumoniae NOT DETECTED  NOT DETECTED Final    Comment: Performed at [facility], [place]. [address], [place], [place] [address]    Procedures and diagnostic studies:  ECHOCARDIOGRAM COMPLETE  Result Date: 07/10/2021  ECHOCARDIOGRAM REPORT   Patient Name:   Austin [name] Date of Exam: 07/10/2021 Medical Rec #:  [id number]  Height:       67.0 in Accession #:    [id number] Weight:       182.5 lb Date of Birth:  1935/01/14  BSA:          1.945 m Patient Age:    45 years   BP:           160/63 mmHg Patient Gender: M          HR:           87 bpm. Exam Location:  Inpatient Procedure: 2D Echo, 3D Echo, Cardiac Doppler and Color Doppler Indications:    I50.40* Unspecified combined systolic (congestive) and diastolic                 (congestive) heart failure  History:        Patient has prior history of Echocardiogram examinations, most                 recent 02/25/2020. CHF, CAD, Aortic Valve Disease,                 Signs/Symptoms:Dyspnea and Chest Pain; Risk                 Factors:Dyslipidemia, Hypertension and Diabetes. Cancer.  Sonographer:    Roseanna Rainbow RDCS Referring Phys: [id number] [place]  1. Left ventricular ejection fraction, by estimation, is 55 to 60%. The left ventricle has normal function. The left ventricle has no regional wall motion abnormalities. There is moderate left ventricular hypertrophy. Left ventricular diastolic parameters are consistent with Grade I diastolic dysfunction (impaired relaxation). Elevated left atrial pressure.  2. Right ventricular systolic function is normal. The right ventricular size is normal. There is severely elevated pulmonary artery systolic pressure.  3. Left atrial size was mildly dilated.  4. Moderate pleural effusion in the left lateral region.  5. The mitral valve is abnormal. Mild mitral valve regurgitation. No evidence of mitral stenosis.  6. The tricuspid valve is abnormal. Tricuspid valve regurgitation is moderate.  7. The aortic valve is tricuspid. There is mild  calcification of the aortic valve. There is mild thickening of the aortic valve. Aortic valve regurgitation is moderate. No aortic stenosis is present.  8. Aortic dilatation noted. There is mild dilatation of the ascending aorta, measuring 38 mm.  9. The inferior vena cava is dilated in size with <50% respiratory variability, suggesting right atrial pressure of 15 mmHg. FINDINGS  Left Ventricle: Left ventricular ejection fraction, by estimation, is 55 to 60%. The left ventricle has normal function. The left ventricle has no regional wall motion abnormalities. The left ventricular internal cavity size was normal in size. There is  moderate left ventricular hypertrophy. Left ventricular diastolic parameters are consistent with Grade I diastolic dysfunction (impaired relaxation). Elevated left atrial pressure. Right Ventricle: The right ventricular size is normal. No increase in right ventricular wall thickness. Right ventricular systolic function is normal. There is severely elevated pulmonary artery systolic pressure. The tricuspid regurgitant velocity is 3.43 m/s, and with an assumed right atrial pressure of 15 mmHg, the estimated right ventricular systolic pressure is 93.7 mmHg. Left Atrium: Left atrial size was mildly dilated. Right Atrium: Right atrial size was normal in size. Pericardium: There is no evidence of pericardial effusion. Mitral Valve: The mitral valve is abnormal. There is mild thickening of the mitral valve leaflet(s). There is  mild calcification of the mitral valve leaflet(s). Mild mitral annular calcification. Mild mitral valve regurgitation. No evidence of mitral valve stenosis. Tricuspid Valve: The tricuspid valve is abnormal. Tricuspid valve regurgitation is moderate . No evidence of tricuspid stenosis. Aortic Valve: The aortic valve is tricuspid. There is mild calcification of the aortic valve. There is mild thickening of the aortic valve. There is mild aortic valve annular calcification.  Aortic valve regurgitation is moderate. Aortic regurgitation PHT  measures 637 msec. No aortic stenosis is present. Aortic valve mean gradient measures 6.0 mmHg. Aortic valve peak gradient measures 11.4 mmHg. Aortic valve area, by VTI measures 2.81 cm. Pulmonic Valve: The pulmonic valve was not well visualized. Pulmonic valve regurgitation is not visualized. No evidence of pulmonic stenosis. Aorta: Aortic dilatation noted. There is mild dilatation of the ascending aorta, measuring 38 mm. Venous: The inferior vena cava is dilated in size with less than 50% respiratory variability, suggesting right atrial pressure of 15 mmHg. IAS/Shunts: No atrial level shunt detected by color flow Doppler. Additional Comments: There is a moderate pleural effusion in the left lateral region.  LEFT VENTRICLE PLAX 2D LVIDd:         5.70 cm      Diastology LVIDs:         4.00 cm      LV e' medial:    5.44 cm/s LV PW:         1.40 cm      LV E/e' medial:  20.6 LV IVS:        1.20 cm      LV e' lateral:   8.70 cm/s LVOT diam:     2.00 cm      LV E/e' lateral: 12.9 LV SV:         94 LV SV Index:   48 LVOT Area:     3.14 cm                              3D Volume EF: LV Volumes (MOD)            3D EF:        51 % LV vol d, MOD A2C: 165.0 ml LV EDV:       212 ml LV vol s, MOD A2C: 79.3 ml  LV ESV:       103 ml LV SV MOD A2C:     85.7 ml  LV SV:        109 ml RIGHT VENTRICLE             IVC RV S prime:     16.80 cm/s  IVC diam: 2.70 cm TAPSE (M-mode): 2.1 cm LEFT ATRIUM             Index        RIGHT ATRIUM           Index LA diam:        4.50 cm 2.31 cm/m   RA Area:     19.60 cm LA Vol (A2C):   74.3 ml 38.20 ml/m  RA Volume:   56.40 ml  28.99 ml/m LA Vol (A4C):   68.9 ml 35.42 ml/m LA Biplane Vol: 75.6 ml 38.86 ml/m  AORTIC VALVE                     PULMONIC VALVE AV Area (Vmax):    3.05 cm  PR End Diast Vel: 2.23 msec AV Area (Vmean):   2.97 cm AV Area (VTI):     2.81 cm AV Vmax:           169.00 cm/s AV Vmean:          109.000  cm/s AV VTI:            0.333 m AV Peak Grad:      11.4 mmHg AV Mean Grad:      6.0 mmHg LVOT Vmax:         164.00 cm/s LVOT Vmean:        103.000 cm/s LVOT VTI:          0.298 m LVOT/AV VTI ratio: 0.89 AI PHT:            637 msec  AORTA Ao Root diam: 3.80 cm Ao Asc diam:  3.80 cm MITRAL VALVE                TRICUSPID VALVE MV Area (PHT): 4.21 cm     TR Peak grad:   47.1 mmHg MV Decel Time: 180 msec     TR Vmax:        343.00 cm/s MV E velocity: 112.00 cm/s MV A velocity: 68.00 cm/s   SHUNTS MV E/A ratio:  1.65         Systemic VTI:  0.30 m                             Systemic Diam: 2.00 cm Austin Dolly MD Electronically signed by Austin Dolly MD Signature Date/Time: 07/10/2021/1:15:14 PM    Final     Medications:    amLODipine  10 mg Oral Daily   atorvastatin  20 mg Oral Daily   cloNIDine  0.3 mg Oral TID   clopidogrel  75 mg Oral Q supper   doxazosin  2 mg Oral QHS   furosemide  60 mg Intravenous BID   heparin  5,000 Units Subcutaneous Q8H   hydrocortisone  1 application Rectal BID   insulin aspart  0-6 Units Subcutaneous TID WC   isosorbide-hydrALAZINE  1 tablet Oral TID   metoprolol succinate  100 mg Oral Daily   pantoprazole  40 mg Oral Daily   potassium chloride  10 mEq Oral BID   potassium chloride  20 mEq Oral Once   sodium chloride flush  3 mL Intravenous Q12H   Continuous Infusions:  sodium chloride       LOS: 3 days   Geradine Girt  [facility]   How to contact the [facility] Attending or Consulting provider [place] or covering provider during after hours [place], for this patient?  Check the care team in [facility] and look for a) attending/consulting [facility] provider listed and b) the [facility] team listed Log into [email or web address] and use [place] universal password to access. If you do not have the password, please contact the hospital operator. Locate the [facility] provider you are looking for under [facility] and page to a number that you can be directly reached. If you  still have difficulty reaching the provider, please page the [facility] (Director on Call) for the Hospitalists listed on amion for assistance.  07/12/2021, 10:36 AM

## 2021-07-12 NOTE — Progress Notes (Signed)
Patient ID: Austin [name], male   DOB: 1934/10/10, 86 y.o.   MRN: [id number] [facility] Progress Note   Assessment/ Plan:   1. Acute kidney Injury on chronic kidney disease stage V: Baseline creatinine usually around 3.7-4.0 and current worsening likely hemodynamically mediated in the setting of volume overload.  Urine output unimpressive overnight at 875 cc that appears to be gradually decreasing over the last 48 hours with weight remarkably unchanged over the last 72 hours.  I will add a dose of metolazone to furosemide today.  He does not have any acute indications for dialysis at this time. 2.  Volume overload/pulmonary edema: On torsemide 60 mg twice daily (transiently increased up from 40 mg twice daily prior to admission) as an outpatient and transitioned to intravenous furosemide on admission.  I will add a dose of metolazone 5 mg today to augment diuresis. 3.  Hypertension: Blood pressure marginally elevated likely compounded by volume status-monitor with ongoing diuresis. 4.  Anemia of chronic disease: Without overt blood loss and downtrending hemoglobin and hematocrit noted, I will check iron studies today and decide on need for supplementation/ESA.  Subjective:   Reports to be feeling fair, slept poorly because of CPAP.   Objective:   BP (!) 150/63 (BP Location: Left Arm)   Pulse (!) 52   Temp 98.1 F (36.7 C) (Oral)   Resp 17   Ht 5\' 7"  (1.702 m)   Wt 83.6 kg   SpO2 98%   BMI 28.87 kg/m   Intake/Output Summary (Last 24 hours) at 07/12/2021 0810 Last data filed at 07/12/2021 0600 Gross per 24 hour  Intake 1300 ml  Output 875 ml  Net 425 ml   Weight change: -0.6 kg  Physical Exam: Gen: Appears comfortable resting in bed-not on supplemental oxygen CVS: Pulse regular rhythm, normal rate, S1 and S2 normal Resp: Clear to auscultation bilaterally, no rales/rhonchi Abd: Soft, obese, nontender, bowel sounds normal Ext: 1+ bilateral lower extremity  edema  Imaging: ECHOCARDIOGRAM COMPLETE  Result Date: 07/10/2021    ECHOCARDIOGRAM REPORT   Patient Name:   Austin [name] Date of Exam: 07/10/2021 Medical Rec #:  [id number]  Height:       67.0 in Accession #:    [id number] Weight:       182.5 lb Date of Birth:  17-May-1935  BSA:          1.945 m Patient Age:    28 years   BP:           160/63 mmHg Patient Gender: M          HR:           87 bpm. Exam Location:  Inpatient Procedure: 2D Echo, 3D Echo, Cardiac Doppler and Color Doppler Indications:    I50.40* Unspecified combined systolic (congestive) and diastolic                 (congestive) heart failure  History:        Patient has prior history of Echocardiogram examinations, most                 recent 02/25/2020. CHF, CAD, Aortic Valve Disease,                 Signs/Symptoms:Dyspnea and Chest Pain; Risk                 Factors:Dyslipidemia, Hypertension and Diabetes. Cancer.  Sonographer:    Roseanna Rainbow RDCS Referring Phys: [id number] [place]  1. Left ventricular ejection fraction, by estimation, is 55 to 60%. The left ventricle has normal function. The left ventricle has no regional wall motion abnormalities. There is moderate left ventricular hypertrophy. Left ventricular diastolic parameters are consistent with Grade I diastolic dysfunction (impaired relaxation). Elevated left atrial pressure.  2. Right ventricular systolic function is normal. The right ventricular size is normal. There is severely elevated pulmonary artery systolic pressure.  3. Left atrial size was mildly dilated.  4. Moderate pleural effusion in the left lateral region.  5. The mitral valve is abnormal. Mild mitral valve regurgitation. No evidence of mitral stenosis.  6. The tricuspid valve is abnormal. Tricuspid valve regurgitation is moderate.  7. The aortic valve is tricuspid. There is mild calcification of the aortic valve. There is mild thickening of the aortic valve. Aortic valve regurgitation is moderate. No aortic  stenosis is present.  8. Aortic dilatation noted. There is mild dilatation of the ascending aorta, measuring 38 mm.  9. The inferior vena cava is dilated in size with <50% respiratory variability, suggesting right atrial pressure of 15 mmHg. FINDINGS  Left Ventricle: Left ventricular ejection fraction, by estimation, is 55 to 60%. The left ventricle has normal function. The left ventricle has no regional wall motion abnormalities. The left ventricular internal cavity size was normal in size. There is  moderate left ventricular hypertrophy. Left ventricular diastolic parameters are consistent with Grade I diastolic dysfunction (impaired relaxation). Elevated left atrial pressure. Right Ventricle: The right ventricular size is normal. No increase in right ventricular wall thickness. Right ventricular systolic function is normal. There is severely elevated pulmonary artery systolic pressure. The tricuspid regurgitant velocity is 3.43 m/s, and with an assumed right atrial pressure of 15 mmHg, the estimated right ventricular systolic pressure is 84.6 mmHg. Left Atrium: Left atrial size was mildly dilated. Right Atrium: Right atrial size was normal in size. Pericardium: There is no evidence of pericardial effusion. Mitral Valve: The mitral valve is abnormal. There is mild thickening of the mitral valve leaflet(s). There is mild calcification of the mitral valve leaflet(s). Mild mitral annular calcification. Mild mitral valve regurgitation. No evidence of mitral valve stenosis. Tricuspid Valve: The tricuspid valve is abnormal. Tricuspid valve regurgitation is moderate . No evidence of tricuspid stenosis. Aortic Valve: The aortic valve is tricuspid. There is mild calcification of the aortic valve. There is mild thickening of the aortic valve. There is mild aortic valve annular calcification. Aortic valve regurgitation is moderate. Aortic regurgitation PHT  measures 637 msec. No aortic stenosis is present. Aortic valve mean  gradient measures 6.0 mmHg. Aortic valve peak gradient measures 11.4 mmHg. Aortic valve area, by VTI measures 2.81 cm. Pulmonic Valve: The pulmonic valve was not well visualized. Pulmonic valve regurgitation is not visualized. No evidence of pulmonic stenosis. Aorta: Aortic dilatation noted. There is mild dilatation of the ascending aorta, measuring 38 mm. Venous: The inferior vena cava is dilated in size with less than 50% respiratory variability, suggesting right atrial pressure of 15 mmHg. IAS/Shunts: No atrial level shunt detected by color flow Doppler. Additional Comments: There is a moderate pleural effusion in the left lateral region.  LEFT VENTRICLE PLAX 2D LVIDd:         5.70 cm      Diastology LVIDs:         4.00 cm      LV e' medial:    5.44 cm/s LV PW:         1.40 cm  LV E/e' medial:  20.6 LV IVS:        1.20 cm      LV e' lateral:   8.70 cm/s LVOT diam:     2.00 cm      LV E/e' lateral: 12.9 LV SV:         94 LV SV Index:   48 LVOT Area:     3.14 cm                              3D Volume EF: LV Volumes (MOD)            3D EF:        51 % LV vol d, MOD A2C: 165.0 ml LV EDV:       212 ml LV vol s, MOD A2C: 79.3 ml  LV ESV:       103 ml LV SV MOD A2C:     85.7 ml  LV SV:        109 ml RIGHT VENTRICLE             IVC RV S prime:     16.80 cm/s  IVC diam: 2.70 cm TAPSE (M-mode): 2.1 cm LEFT ATRIUM             Index        RIGHT ATRIUM           Index LA diam:        4.50 cm 2.31 cm/m   RA Area:     19.60 cm LA Vol (A2C):   74.3 ml 38.20 ml/m  RA Volume:   56.40 ml  28.99 ml/m LA Vol (A4C):   68.9 ml 35.42 ml/m LA Biplane Vol: 75.6 ml 38.86 ml/m  AORTIC VALVE                     PULMONIC VALVE AV Area (Vmax):    3.05 cm      PR End Diast Vel: 2.23 msec AV Area (Vmean):   2.97 cm AV Area (VTI):     2.81 cm AV Vmax:           169.00 cm/s AV Vmean:          109.000 cm/s AV VTI:            0.333 m AV Peak Grad:      11.4 mmHg AV Mean Grad:      6.0 mmHg LVOT Vmax:         164.00 cm/s LVOT Vmean:         103.000 cm/s LVOT VTI:          0.298 m LVOT/AV VTI ratio: 0.89 AI PHT:            637 msec  AORTA Ao Root diam: 3.80 cm Ao Asc diam:  3.80 cm MITRAL VALVE                TRICUSPID VALVE MV Area (PHT): 4.21 cm     TR Peak grad:   47.1 mmHg MV Decel Time: 180 msec     TR Vmax:        343.00 cm/s MV E velocity: 112.00 cm/s MV A velocity: 68.00 cm/s   SHUNTS MV E/A ratio:  1.65         Systemic VTI:  0.30 m  Systemic Diam: 2.00 cm Carlyle Dolly MD Electronically signed by Carlyle Dolly MD Signature Date/Time: 07/10/2021/1:15:14 PM    Final     Labs: BMET Recent Labs  Lab 07/08/21 2358 07/09/21 0908 07/10/21 0246 07/11/21 0235 07/12/21 0230  NA 144 143 142 142 142  K 3.3* 3.3* 3.7 3.7 3.6  CL 113* 114* 112* 113* 112*  CO2 18* 19* 20* 21* 21*  GLUCOSE 192* 162* 151* 144* 151*  BUN 45* 46* 48* 55* 60*  CREATININE 4.02* 4.04* 4.52* 4.77* 4.86*  CALCIUM 9.0 8.7* 8.5* 8.1* 8.3*   CBC Recent Labs  Lab 07/08/21 2358 07/09/21 0333 07/10/21 0246 07/11/21 0235 07/11/21 0608  WBC 12.0* 9.2 10.0 6.8  --   NEUTROABS 10.6*  --   --   --   --   HGB 9.0* 7.9* 8.3* 6.9* 7.3*  HCT 28.6* 25.0* 26.5* 22.0* 22.5*  MCV 83.4 83.3 82.8 84.0  --   PLT 297 270 180 189  --     Medications:     amLODipine  10 mg Oral Daily   atorvastatin  20 mg Oral Daily   cloNIDine  0.3 mg Oral TID   clopidogrel  75 mg Oral Q supper   doxazosin  2 mg Oral QHS   furosemide  60 mg Intravenous BID   heparin  5,000 Units Subcutaneous Q8H   hydrocortisone  1 application Rectal BID   insulin aspart  0-6 Units Subcutaneous TID WC   isosorbide-hydrALAZINE  1 tablet Oral TID   metoprolol succinate  100 mg Oral Daily   pantoprazole  40 mg Oral Daily   potassium chloride  10 mEq Oral BID   potassium chloride  20 mEq Oral Once   sodium chloride flush  3 mL Intravenous Q12H   Elmarie Shiley, MD 07/12/2021, 8:10 AM

## 2021-07-13 DIAGNOSIS — N185 Chronic kidney disease, stage 5: Secondary | ICD-10-CM | POA: Diagnosis not present

## 2021-07-13 DIAGNOSIS — E1122 Type 2 diabetes mellitus with diabetic chronic kidney disease: Secondary | ICD-10-CM | POA: Diagnosis not present

## 2021-07-13 DIAGNOSIS — I5033 Acute on chronic diastolic (congestive) heart failure: Secondary | ICD-10-CM | POA: Diagnosis not present

## 2021-07-13 DIAGNOSIS — I161 Hypertensive emergency: Secondary | ICD-10-CM | POA: Diagnosis not present

## 2021-07-13 LAB — CBC
HCT: 21.8 % — ABNORMAL LOW (ref 39.0–52.0)
Hemoglobin: 7.1 g/dL — ABNORMAL LOW (ref 13.0–17.0)
MCH: 26.8 pg (ref 26.0–34.0)
MCHC: 32.6 g/dL (ref 30.0–36.0)
MCV: 82.3 fL (ref 80.0–100.0)
Platelets: 197 10*3/uL (ref 150–400)
RBC: 2.65 MIL/uL — ABNORMAL LOW (ref 4.22–5.81)
RDW: 14.5 % (ref 11.5–15.5)
WBC: 6.6 10*3/uL (ref 4.0–10.5)
nRBC: 0 % (ref 0.0–0.2)

## 2021-07-13 LAB — RENAL FUNCTION PANEL
Albumin: 2.6 g/dL — ABNORMAL LOW (ref 3.5–5.0)
Anion gap: 10 (ref 5–15)
BUN: 65 mg/dL — ABNORMAL HIGH (ref 8–23)
CO2: 22 mmol/L (ref 22–32)
Calcium: 8.5 mg/dL — ABNORMAL LOW (ref 8.9–10.3)
Chloride: 109 mmol/L (ref 98–111)
Creatinine, Ser: 4.81 mg/dL — ABNORMAL HIGH (ref 0.61–1.24)
GFR, Estimated: 11 mL/min — ABNORMAL LOW (ref >60)
Glucose, Bld: 128 mg/dL — ABNORMAL HIGH (ref 70–99)
Phosphorus: 4.6 mg/dL (ref 2.5–4.6)
Potassium: 3.3 mmol/L — ABNORMAL LOW (ref 3.5–5.1)
Sodium: 141 mmol/L (ref 135–145)

## 2021-07-13 LAB — GLUCOSE, CAPILLARY
Glucose-Capillary: 139 mg/dL — ABNORMAL HIGH (ref 70–99)
Glucose-Capillary: 240 mg/dL — ABNORMAL HIGH (ref 70–99)
Glucose-Capillary: 146 mg/dL — ABNORMAL HIGH (ref 70–99)

## 2021-07-13 MED ORDER — TORSEMIDE 40 MG PO TABS: 40.0000 mg | ORAL_TABLET | Freq: Every day | ORAL | 0 refills | Status: DC

## 2021-07-13 MED ORDER — TORSEMIDE 20 MG PO TABS
40.0000 mg | ORAL_TABLET | Freq: Every day | ORAL | Status: DC
  Administered 2021-07-13 – 2021-07-14 (×2): 40 mg via ORAL
  Filled 2021-07-13 (×2): qty 2

## 2021-07-13 MED ORDER — POTASSIUM CHLORIDE CRYS ER 20 MEQ PO TBCR
20.0000 meq | EXTENDED_RELEASE_TABLET | Freq: Two times a day (BID) | ORAL | Status: DC
  Administered 2021-07-13 – 2021-07-14 (×3): 20 meq via ORAL
  Filled 2021-07-13 (×3): qty 1

## 2021-07-13 MED ORDER — GUAIFENESIN-DM 100-10 MG/5ML PO SYRP
5.0000 mL | ORAL_SOLUTION | ORAL | Status: DC | PRN
  Filled 2021-07-13: qty 5

## 2021-07-13 MED ORDER — POTASSIUM CHLORIDE CRYS ER 20 MEQ PO TBCR
20.0000 meq | EXTENDED_RELEASE_TABLET | Freq: Once | ORAL | Status: AC
  Administered 2021-07-13: 20 meq via ORAL
  Filled 2021-07-13: qty 1

## 2021-07-13 MED ORDER — METOLAZONE 5 MG PO TABS
5.0000 mg | ORAL_TABLET | Freq: Once | ORAL | Status: AC
  Administered 2021-07-13: 5 mg via ORAL
  Filled 2021-07-13: qty 1

## 2021-07-13 MED ORDER — SODIUM CHLORIDE 0.9 % IV SOLN
510.0000 mg | Freq: Once | INTRAVENOUS | Status: AC
  Administered 2021-07-13: 510 mg via INTRAVENOUS
  Filled 2021-07-13: qty 17

## 2021-07-13 MED ORDER — BISACODYL 10 MG RE SUPP: 10.0000 mg | Freq: Every day | RECTAL | 0 refills | Status: DC | PRN

## 2021-07-13 NOTE — Progress Notes (Signed)
SATURATION QUALIFICATIONS: (This note is used to comply with regulatory documentation for home oxygen)  Patient Saturations on Room Air at Rest = 96%  Patient Saturations on Room Air while Ambulating = 93%  Patient Saturations on 0 Liters of oxygen while Ambulating = 93%  Please briefly explain why patient needs home oxygen:

## 2021-07-13 NOTE — TOC Transition Note (Signed)
Transition of Care [facility]) - CM/SW Discharge Note   Patient Details  Name: Austin [name] MRN: [id number] Date of Birth: [birth year]  [facility] CM/SW Contact:  Zenon Mayo, RN Phone Number: 07/13/2021, 12:49 PM   Clinical Narrative:    Patient is from home with wife, NCM offered choice for HHPT, he states he does not have a preference. NCM made referral to [facility] with [facility] for [place]. He is able to take referral soc will begin 24 to 48 hrs post dc.  Patient will also need a BSC, is ok with [employer] supplying.  NCM made referral to [place] with [employer], they will bring the bsc to the room prior to dc. He has transportation home today.     Final next level of care: [place] Barriers to Discharge: No Barriers Identified   Patient Goals and CMS Choice Patient states their goals for this hospitalization and ongoing recovery are:: return home CMS Medicare.gov Compare Post Acute Care list provided to:: Patient Choice offered to / list presented to : Patient  Discharge Placement                       Discharge Plan and Services In-house Referral: NA Discharge Planning Services: CM Consult Post Acute Care Choice: Home Health, Durable Medical Equipment          DME Arranged: Bedside commode DME Agency: [facility] Date DME Agency Contacted: 07/13/21 Time DME Agency Contacted: 1244 Representative spoke with at DME Agency: [place]: PT [place]: [facility] [place] Date [place]: 07/13/21 Time [place]: 1244 Representative spoke with at [place]: [place] ([place]) Interventions     Readmission Risk Interventions Readmission Risk Prevention Plan 07/13/2021  Transportation Screening Complete  PCP or Specialist Appt within 3-5 Days Complete  HRI or [place] Complete  Social Work Consult for [place] Planning/Counseling [place]  Screening Not Applicable  Medication Review Press photographer) Complete  Some recent data might be hidden

## 2021-07-13 NOTE — Progress Notes (Signed)
Attempted to d/c patient but late in the day patient c/o nausea, cough with pain etc.  He is on room air, able to climb stairs.  Will monitor overnight for anticipation of AM d/c Eulogio Bear DO

## 2021-07-13 NOTE — Consult Note (Signed)
[facility] Inpatient Consult   07/13/2021  Kazumi Lachney [birth year] [id number]  [facility] CM)   Patient evaluated for community based chronic complex disease management services with [place] [facility] with noted high risk score for unplanned readmission.   Spoke with patient bedside. Explained [facility] CM program and brochure provided. Patient consents to post hospital follow up for assistance with chronic disease management. Referral placed for CCM services with embedded team at primary provider office.  Of note, [facility] Care Management services does not replace or interfere with any services that are arranged by inpatient case management or social work.   Netta Cedars, MSN, RN [facility] [employer] [phone]  Toll free office [phone]

## 2021-07-13 NOTE — Discharge Summary (Signed)
Physician Discharge Summary  Austin [name] FTD:[id number] DOB: [birth year] DOA: 07/08/2021  PCP: Dorothyann Peng, NP  Admit date: 07/08/2021 Discharge date: 07/13/2021  Admitted From: home Discharge disposition: home   Recommendations for Outpatient Follow-Up:   Renal appointment in AM BMP w/in 1 week Needs outpatient sleep study   Discharge Diagnosis:   Principal Problem:   Diabetes mellitus with renal complications ([place]) Active Problems:   Hypertensive urgency   Elevated troponin   Hypokalemia   Pulmonary nodule 1 cm or greater in diameter   Acute on chronic diastolic CHF (congestive heart failure) (HCC)   CKD (chronic kidney disease), stage V ([place])    Discharge Condition: Improved.  Diet recommendation: Low sodium, heart healthy.  Carbohydrate-modified.    Wound care: None.  Code status: Full.   History of Present Illness:   Austin [name] is a pleasant 86 y.o. male with medical history significant for type 2 diabetes mellitus, resistant hypertension, CKD IV or V, CHF, chronic cough, and pulmonary nodule, now presenting to the emergency department for evaluation of shortness of breath.  Patient reports a few days of progressive dyspnea and increased bilateral leg swelling despite recent increase in torsemide.  Legs are now swollen above the knees and he also feels some swelling in his lower abdomen now.  Shortness of breath is worse at night.  He denies any dietary indiscretion but may have been drinking more fluids due to dry mouth which she attributes to increased torsemide.  He denies chest pain, fevers, or chills.   Hospital Course by Problem:   Acute kidney Injury on chronic kidney disease stage V: Baseline creatinine usually around 3.7-4.0 and current worsening likely hemodynamically mediated in the setting of volume overload.  Increased urine output overnight with the addition of metolazone and essentially stable renal function seen on labs this morning.  -  convert him to oral torsemide (40 mg twice daily) today a -follow-up with Dr. Royce Macadamia tomorrow.   - may need metolazone 2 or 3 days a week based on his outpatient volume assessment--defer to renal   Volume overload/pulmonary edema: Admitted after failure of titrating outpatient diuretic therapy and appears to be improving with ongoing intravenous diuresis based on his subjective assessment.  Hypertension: Blood pressure marginally elevated likely compounded by volume status -resume home meds   Anemia of chronic disease: Without overt blood loss and downtrending hemoglobin and hematocrit noted, iron stores yesterday indicate significant deficiency with an iron saturation of 5% and ferritin of 43.   -s/p dose of Feraheme today.   Type II DM  - A1c was 6.8% in January 2022  - resume home meds   Hypokalemia  -repleted   Elevated troponin  - HS troponin is 49 without chest pain  - Likely related to severe HTN and respiratory distress rather than ACS    Pulmonary nodule  - Followed by pulmonology with plan for repeat CT in April 2023     constipation -bowel regimen -suppository    Medical Consultants:   renal   Discharge Exam:   Vitals:   07/13/21 0939 07/13/21 0942  BP: (!) 153/61   Pulse:  61  Resp:    Temp:    SpO2:     Vitals:   07/12/21 2248 07/13/21 0349 07/13/21 0939 07/13/21 0942  BP:  (!) 152/64 (!) 153/61   Pulse: (!) 58 (!) 52  61  Resp: 18 20    Temp:  98.2 F (36.8 C)    TempSrc:  Oral    SpO2: 98% 100%    Weight:  82.6 kg    Height:        General exam: Appears calm and comfortable.   The results of significant diagnostics from this hospitalization (including imaging, microbiology, ancillary and laboratory) are listed below for reference.     Procedures and Diagnostic Studies:   DG Chest 2 View  Result Date: 07/09/2021 CLINICAL DATA:  Shortness of breath and cough. EXAM: CHEST - 2 VIEW COMPARISON:  Chest radiograph dated 04/06/2021.  FINDINGS: There is cardiomegaly with vascular congestion and edema. No focal consolidation, pleural effusion, or pneumothorax. Sclerotic calcification of the aorta. No acute osseous pathology. IMPRESSION: Cardiomegaly with vascular congestion and edema. No focal consolidation. Electronically Signed   By: Anner Crete M.D.   On: 07/09/2021 00:35     Labs:   Basic Metabolic Panel: Recent Labs  Lab 07/09/21 0333 07/09/21 0908 07/10/21 0246 07/11/21 0235 07/12/21 0230 07/13/21 0312  NA  --  143 142 142 142 141  K  --  3.3* 3.7 3.7 3.6 3.3*  CL  --  114* 112* 113* 112* 109  CO2  --  19* 20* 21* 21* 22  GLUCOSE  --  162* 151* 144* 151* 128*  BUN  --  46* 48* 55* 60* 65*  CREATININE  --  4.04* 4.52* 4.77* 4.86* 4.81*  CALCIUM  --  8.7* 8.5* 8.1* 8.3* 8.5*  MG 2.0  --   --   --  2.1  --   PHOS  --   --   --   --   --  4.6   GFR Estimated Creatinine Clearance: 11.8 mL/min (A) (by C-G formula based on SCr of 4.81 mg/dL (H)). Liver Function Tests: Recent Labs  Lab 07/13/21 0312  ALBUMIN 2.6*   No results for input(s): LIPASE, AMYLASE in the last 168 hours. No results for input(s): AMMONIA in the last 168 hours. Coagulation profile No results for input(s): INR, PROTIME in the last 168 hours.  CBC: Recent Labs  Lab 07/08/21 2358 07/09/21 0333 07/10/21 0246 07/11/21 0235 07/11/21 0608 07/12/21 0230 07/13/21 0312  WBC 12.0* 9.2 10.0 6.8  --  7.6 6.6  NEUTROABS 10.6*  --   --   --   --   --   --   HGB 9.0* 7.9* 8.3* 6.9* 7.3* 7.1* 7.1*  HCT 28.6* 25.0* 26.5* 22.0* 22.5* 22.1* 21.8*  MCV 83.4 83.3 82.8 84.0  --  84.0 82.3  PLT 297 270 180 189  --  157 197   Cardiac Enzymes: No results for input(s): CKTOTAL, CKMB, CKMBINDEX, TROPONINI in the last 168 hours. BNP: Invalid input(s): POCBNP CBG: Recent Labs  Lab 07/12/21 0544 07/12/21 1127 07/12/21 1631 07/12/21 2124 07/13/21 0606  GLUCAP 133* 186* 88 201* 139*   D-Dimer No results for input(s): DDIMER in the last  72 hours. Hgb A1c No results for input(s): HGBA1C in the last 72 hours. Lipid Profile No results for input(s): CHOL, HDL, LDLCALC, TRIG, CHOLHDL, LDLDIRECT in the last 72 hours. Thyroid function studies No results for input(s): TSH, T4TOTAL, T3FREE, THYROIDAB in the last 72 hours.  Invalid input(s): FREET3 Anemia work up Recent Labs    07/12/21 0926  FERRITIN 43  TIBC 241*  IRON 13*   Microbiology Recent Results (from the past 240 hour(s))  Resp Panel by RT-PCR (Flu A&B, Covid) Nasopharyngeal Swab     Status: None   Collection Time: 07/08/21 11:53 PM   Specimen: Nasopharyngeal  Swab; Nasopharyngeal(NP) swabs in vial transport medium  Result Value Ref Range Status   SARS Coronavirus 2 by RT PCR NEGATIVE NEGATIVE Final    Comment: (NOTE) SARS-CoV-2 target nucleic acids are NOT DETECTED.  The SARS-CoV-2 RNA is generally detectable in upper respiratory specimens during the acute phase of infection. The lowest concentration of SARS-CoV-2 viral copies this assay can detect is 138 copies/mL. A negative result does not preclude SARS-Cov-2 infection and should not be used as the sole basis for treatment or other patient management decisions. A negative result may occur with  improper specimen collection/handling, submission of specimen other than nasopharyngeal swab, presence of viral mutation(s) within the areas targeted by this assay, and inadequate number of viral copies(<138 copies/mL). A negative result must be combined with clinical observations, patient history, and epidemiological information. The expected result is Negative.  Fact Sheet for Patients:  EntrepreneurPulse.com.au  Fact Sheet for Healthcare Providers:  IncredibleEmployment.be  This test is no t yet approved or cleared by the [place] FDA and  has been authorized for detection and/or diagnosis of SARS-CoV-2 by FDA under an Emergency Use Authorization (EUA). This EUA  will remain  in effect (meaning this test can be used) for the duration of the COVID-19 declaration under Section 564(b)(1) of the Act, 21 U.S.C.section 360bbb-3(b)(1), unless the authorization is terminated  or revoked sooner.       Influenza A by PCR NEGATIVE NEGATIVE Final   Influenza B by PCR NEGATIVE NEGATIVE Final    Comment: (NOTE) The Xpert Xpress SARS-CoV-2/FLU/RSV plus assay is intended as an aid in the diagnosis of influenza from Nasopharyngeal swab specimens and should not be used as a sole basis for treatment. Nasal washings and aspirates are unacceptable for Xpert Xpress SARS-CoV-2/FLU/RSV testing.  Fact Sheet for Patients: EntrepreneurPulse.com.au  Fact Sheet for Healthcare Providers: IncredibleEmployment.be  This test is not yet approved or cleared by the [place] FDA and has been authorized for detection and/or diagnosis of SARS-CoV-2 by FDA under an Emergency Use Authorization (EUA). This EUA will remain in effect (meaning this test can be used) for the duration of the COVID-19 declaration under Section 564(b)(1) of the Act, 21 U.S.C. section 360bbb-3(b)(1), unless the authorization is terminated or revoked.  Performed at [facility], [place] [address], [place], [place] [address]   Respiratory (~20 pathogens) panel by PCR     Status: None   Collection Time: 07/10/21 10:20 AM   Specimen: Nasopharyngeal Swab; Respiratory  Result Value Ref Range Status   Adenovirus NOT DETECTED NOT DETECTED Final   Coronavirus 229E NOT DETECTED NOT DETECTED Final    Comment: (NOTE) The Coronavirus on the Respiratory Panel, DOES NOT test for the novel  Coronavirus (2019 nCoV)    Coronavirus HKU1 NOT DETECTED NOT DETECTED Final   Coronavirus NL63 NOT DETECTED NOT DETECTED Final   Coronavirus OC43 NOT DETECTED NOT DETECTED Final   Metapneumovirus NOT DETECTED NOT DETECTED Final   Rhinovirus / Enterovirus NOT DETECTED NOT DETECTED  Final   Influenza A NOT DETECTED NOT DETECTED Final   Influenza B NOT DETECTED NOT DETECTED Final   Parainfluenza Virus 1 NOT DETECTED NOT DETECTED Final   Parainfluenza Virus 2 NOT DETECTED NOT DETECTED Final   Parainfluenza Virus 3 NOT DETECTED NOT DETECTED Final   Parainfluenza Virus 4 NOT DETECTED NOT DETECTED Final   Respiratory Syncytial Virus NOT DETECTED NOT DETECTED Final   Bordetella pertussis NOT DETECTED NOT DETECTED Final   Bordetella Parapertussis NOT DETECTED NOT DETECTED  Final   Chlamydophila pneumoniae NOT DETECTED NOT DETECTED Final   Mycoplasma pneumoniae NOT DETECTED NOT DETECTED Final    Comment: Performed at [facility], [place] [address], [place], [place] [address]     Discharge Instructions:   Discharge Instructions     Diet - low sodium heart healthy   Complete by: As directed    Diet Carb Modified   Complete by: As directed    Increase activity slowly   Complete by: As directed       Allergies as of 07/13/2021       Reactions   Aspirin Other (See Comments)   High doses causes stomach ulcer and bleeding        Medication List     STOP taking these medications    predniSONE 10 MG tablet Commonly known as: DELTASONE       TAKE these medications    Accu-Chek Aviva Plus w/Device Kit Used to check blood glucose 2 times a day or PRN   Accu-Chek Guide test strip Generic drug: glucose blood USED TO CHECK BLOOD GLUCOSE TWICE A DAY OR AS NEEDED   Accu-Chek Softclix Lancets lancets USED TO CHECK BLOOD GLUCOSE TWICE A DAY OR AS NEEDED   acetaminophen 325 MG tablet Commonly known as: TYLENOL Take 325 mg by mouth 4 (four) times daily.   albuterol 108 (90 Base) MCG/ACT inhaler Commonly known as: VENTOLIN HFA Inhale 2 puffs into the lungs every 6 (six) hours as needed for wheezing or shortness of breath.   amLODipine 10 MG tablet Commonly known as: NORVASC TAKE 1 TABLET BY MOUTH EVERY DAY   atorvastatin 20 MG tablet Commonly known  as: LIPITOR TAKE 1 TABLET BY MOUTH EVERY DAY   bisacodyl 10 MG suppository Commonly known as: DULCOLAX Place 1 suppository (10 mg total) rectally daily as needed for mild constipation.   cholecalciferol 25 MCG (1000 UNIT) tablet Commonly known as: VITAMIN D3 Take 1,000 Units by mouth daily.   cloNIDine 0.3 MG tablet Commonly known as: CATAPRES Take 1 tablet (0.3 mg total) by mouth 3 (three) times daily.   clopidogrel 75 MG tablet Commonly known as: PLAVIX TAKE 1 TABLET BY MOUTH EVERY DAY   doxazosin 2 MG tablet Commonly known as: CARDURA Take 2 mg by mouth at bedtime.   esomeprazole 40 MG capsule Commonly known as: NEXIUM TAKE 1 CAPSULE BY MOUTH EVERY DAY What changed:  how much to take how to take this   glipiZIDE 5 MG 24 hr tablet Commonly known as: GLUCOTROL XL TAKE 1 TABLET BY MOUTH EVERY DAY What changed: how to take this   hydrocortisone 2.5 % rectal cream Commonly known as: ANUSOL-HC PLACE 1 APPLICATION RECTALLY 2 TIMES A DAY. What changed: See the new instructions.   isosorbide-hydrALAZINE 20-37.5 MG tablet Commonly known as: BIDIL Take 1 tablet by mouth 3 (three) times daily.   Klor-Con M10 10 MEQ tablet Generic drug: potassium chloride Take 2 tablets (20 mEq total) by mouth 2 (two) times daily. What changed: how much to take   lactulose 10 GM/15ML solution Commonly known as: [place] 15 ML BY MOUTH 2 TIMES DAILY AS NEEDED FOR MILD CONSTIPATION. What changed: See the new instructions.   metoprolol succinate 100 MG 24 hr tablet Commonly known as: TOPROL-XL Take 1 tablet (100 mg total) by mouth daily. Take with or immediately following a meal.   polyethylene glycol 17 g packet Commonly known as: MIRALAX / GLYCOLAX Take 17 g by mouth daily as  needed for mild constipation.   SYSTANE BALANCE OP Place 1 drop into both eyes daily as needed (dry eyes).   Torsemide 40 MG Tabs Take 40 mg by mouth daily. Start taking on: July 14, 2021 What  changed:  medication strength how much to take when to take this   triamcinolone cream 0.1 % Commonly known as: KENALOG Apply 1 application topically daily as needed (dry/irritated skin).        Follow-up Information     Nafziger, Tommi Rumps, NP Follow up in 1 week(s).   Specialty: Family Medicine Contact information: [address] [place] [address] [phone]         Claudia Desanctis, MD Follow up.   Specialty: Nephrology Why: keep appointment tomm Contact information: [address] [place] [address] [phone]                  Time coordinating discharge: 35 min  Signed:  Geradine Girt DO  [facility] 07/13/2021, 11:32 AM

## 2021-07-13 NOTE — Progress Notes (Signed)
Physical Therapy Treatment Patient Details Name: Austin [name] MRN: [id number] DOB: 1935/05/21 Today's Date: 07/13/2021   History of Present Illness The pt is an 86 yo male presenting 11/18 with SOB and LE swelling. Upon work-up, suspect HTN emergency with CHF exacerbation. PMH includes: CHF, HTN, DM II, and CKD V.    PT Comments    Pt making good progress today.  He demonstrated improved stability and O2 sats were stable on RA even with stair training.  Pt with min DOE on stairs.  Demonstrates mobility necessary to return home from PT perspective but while admitted will continue to progress.     Recommendations for follow up therapy are one component of a multi-disciplinary discharge planning process, led by the attending physician.  Recommendations may be updated based on patient status, additional functional criteria and insurance authorization.  Follow Up Recommendations  Home health PT     Assistance Recommended at Discharge Intermittent Supervision/Assistance  Equipment Recommendations  BSC/3in1;Cane    Recommendations for Other Services       Precautions / Restrictions Precautions Precautions: Fall Precaution Comments: watch SpO2     Mobility  Bed Mobility               General bed mobility comments: pt OOB at start and end of session    Transfers Overall transfer level: Needs assistance Equipment used: None Transfers: Sit to/from Stand Sit to Stand: Supervision                Ambulation/Gait Ambulation/Gait assistance: Min guard;Supervision Gait Distance (Feet): 200 Feet Assistive device: None Gait Pattern/deviations: Trunk flexed       General Gait Details: Min guard progressing to close supervision, slight trunk flexion with cues for posture, no overt LOB but did drift slightly L/R at times; no DOE with O2 sats 93% on RA   Stairs Stairs: Yes Stairs assistance: Min guard Stair Management: One rail Right;Step to pattern;Forwards Number of  Stairs: 10 General stair comments: Slow but steady with stairs; DOE of 2/4 with steps and sats 93% on RA   Wheelchair Mobility    Modified Rankin (Stroke Patients Only)       Balance Overall balance assessment: Needs assistance Sitting-balance support: No upper extremity supported Sitting balance-Leahy Scale: Normal     Standing balance support: No upper extremity supported Standing balance-Leahy Scale: Good                              Cognition Arousal/Alertness: Awake/alert Behavior During Therapy: WFL for tasks assessed/performed Overall Cognitive Status: Within Functional Limits for tasks assessed                                 General Comments: able to follow all cues and commands, pleasant        Exercises      General Comments General comments (skin integrity, edema, etc.): Pt on RA with sats 97% rest and 93% ambulation and stairs.  Educated pt on gradual increase in endurance with frequent short walks at home.      Pertinent Vitals/Pain Pain Assessment: No/denies pain    Home Living                          Prior Function            PT Goals (current goals can now  be found in the care plan section) Progress towards PT goals: Progressing toward goals    Frequency    Min 3X/week      PT Plan Current plan remains appropriate    Co-evaluation              AM-PAC PT "6 Clicks" Mobility   Outcome Measure  Help needed turning from your back to your side while in a flat bed without using bedrails?: A Little Help needed moving from lying on your back to sitting on the side of a flat bed without using bedrails?: A Little Help needed moving to and from a bed to a chair (including a wheelchair)?: A Little Help needed standing up from a chair using your arms (e.g., wheelchair or bedside chair)?: A Little Help needed to walk in hospital room?: A Little Help needed climbing 3-5 steps with a railing? : A  Little 6 Click Score: 18    End of Session Equipment Utilized During Treatment: Gait belt Activity Tolerance: Patient tolerated treatment well Patient left: in chair;with call bell/phone within reach Nurse Communication: Mobility status PT Visit Diagnosis: Unsteadiness on feet (R26.81);Other abnormalities of gait and mobility (R26.89)     Time: 1610-9604 PT Time Calculation (min) (ACUTE ONLY): 21 min  Charges:  $Gait Training: 8-22 mins                     Abran Richard, PT Acute Rehab Services Pager [phone] [facility] [place] 07/13/2021, 2:55 PM

## 2021-07-13 NOTE — Progress Notes (Addendum)
Patient ID: Austin [name], male   DOB: 25-Sep-1934, 86 y.o.   MRN: [id number] [facility] Progress Note   Assessment/ Plan:   1. Acute kidney Injury on chronic kidney disease stage V: Baseline creatinine usually around 3.7-4.0 and current worsening likely hemodynamically mediated in the setting of volume overload.  Increased urine output overnight with the addition of metolazone and essentially stable renal function seen on labs this morning.  I will convert him to oral torsemide (40 mg twice daily) today and add an additional dose of metolazone.  If stable from a respiratory standpoint, he may be discharged home today to follow-up with Dr. Royce Macadamia tomorrow.  He may need metolazone 2 or 3 days a week based on his outpatient volume assessment.  On potassium replacement. 2.  Volume overload/pulmonary edema: Admitted after failure of titrating outpatient diuretic therapy and appears to be improving with ongoing intravenous diuresis based on his subjective assessment.  Net -2.4 L with 1.6 kg weight reduction. 3.  Hypertension: Blood pressure marginally elevated likely compounded by volume status-monitor with ongoing diuresis. 4.  Anemia of chronic disease: Without overt blood loss and downtrending hemoglobin and hematocrit noted, iron stores yesterday indicate significant deficiency with an iron saturation of 5% and ferritin of 43.  We will give a dose of Feraheme today.  Subjective:   Reports to be feeling better and tolerated CPAP overnight   Objective:   BP (!) 152/64 (BP Location: Right Arm)   Pulse (!) 52   Temp 98.2 F (36.8 C) (Oral)   Resp 20   Ht 5\' 7"  (1.702 m)   Wt 82.6 kg   SpO2 100%   BMI 28.51 kg/m   Intake/Output Summary (Last 24 hours) at 07/13/2021 0707 Last data filed at 07/13/2021 0351 Gross per 24 hour  Intake 1320 ml  Output 2300 ml  Net -980 ml   Weight change: -1.045 kg  Physical Exam: Gen: Appears comfortable resting in bed on CPAP CVS: Pulse regular  rhythm, normal rate, S1 and S2 normal Resp: Clear to auscultation bilaterally, no rales/rhonchi Abd: Soft, obese, nontender, bowel sounds normal Ext: Trace bilateral lower extremity edema  Imaging: No results found.  Labs: BMET Recent Labs  Lab 07/08/21 2358 07/09/21 0908 07/10/21 0246 07/11/21 0235 07/12/21 0230 07/13/21 0312  NA 144 143 142 142 142 141  K 3.3* 3.3* 3.7 3.7 3.6 3.3*  CL 113* 114* 112* 113* 112* 109  CO2 18* 19* 20* 21* 21* 22  GLUCOSE 192* 162* 151* 144* 151* 128*  BUN 45* 46* 48* 55* 60* 65*  CREATININE 4.02* 4.04* 4.52* 4.77* 4.86* 4.81*  CALCIUM 9.0 8.7* 8.5* 8.1* 8.3* 8.5*  PHOS  --   --   --   --   --  4.6   CBC Recent Labs  Lab 07/08/21 2358 07/09/21 0333 07/10/21 0246 07/11/21 0235 07/11/21 0608 07/12/21 0230 07/13/21 0312  WBC 12.0*   < > 10.0 6.8  --  7.6 6.6  NEUTROABS 10.6*  --   --   --   --   --   --   HGB 9.0*   < > 8.3* 6.9* 7.3* 7.1* 7.1*  HCT 28.6*   < > 26.5* 22.0* 22.5* 22.1* 21.8*  MCV 83.4   < > 82.8 84.0  --  84.0 82.3  PLT 297   < > 180 189  --  157 197   < > = values in this interval not displayed.    Medications:  amLODipine  10 mg Oral Daily   atorvastatin  20 mg Oral Daily   cloNIDine  0.3 mg Oral TID   clopidogrel  75 mg Oral Q supper   doxazosin  2 mg Oral QHS   furosemide  60 mg Intravenous BID   heparin  5,000 Units Subcutaneous Q8H   hydrocortisone  1 application Rectal BID   insulin aspart  0-6 Units Subcutaneous TID WC   isosorbide-hydrALAZINE  1 tablet Oral TID   metoprolol succinate  100 mg Oral Daily   pantoprazole  40 mg Oral Daily   potassium chloride  10 mEq Oral BID   potassium chloride  20 mEq Oral Once   sodium chloride flush  3 mL Intravenous Q12H   Elmarie Shiley, MD 07/13/2021, 7:07 AM

## 2021-07-13 NOTE — Care Management Important Message (Signed)
Important Message  Patient Details  Name: Hilbert Briggs MRN: [id number] Date of Birth: [birth year]   Medicare Important Message Given:  Yes     [name] 07/13/2021, 8:23 AM

## 2021-07-13 NOTE — Telephone Encounter (Signed)
Noted  

## 2021-07-13 NOTE — Plan of Care (Signed)
  Problem: Education: Goal: Ability to demonstrate management of disease process will improve Outcome: Progressing   Problem: Education: Goal: Ability to verbalize understanding of medication therapies will improve Outcome: Progressing   

## 2021-07-13 NOTE — Progress Notes (Signed)
Mobility Specialist Progress Note:   07/13/21 1214  Mobility  Activity Ambulated in hall  Level of Assistance Modified independent, requires aide device or extra time  Assistive Device None  Distance Ambulated (ft) 150 ft  Mobility Ambulated with assistance in hallway  Mobility Response Tolerated well  Mobility performed by Mobility specialist  Bed Position Chair  $Mobility charge 1 Mobility  SATURATION QUALIFICATIONS: (This note is used to comply with regulatory documentation for home oxygen)  Patient Saturations on [place] at Rest = 96%  Patient Saturations on [place] while Ambulating = 93%  Patient Saturations on 0 Liters of oxygen while Ambulating = n/a%  Pt received in bed willing to participate in mobility. Complaints of toe pain. SpO2 during ambulation on RA ranged from 93%-95%. Upon returning to room pt stated he was SOB, reminded him of pursed lip breathing. Pt left in chair with call bell in reach and all needs met.  [facility] Public librarian Phone [phone] Secondary Phone [phone]

## 2021-07-13 NOTE — TOC Initial Note (Signed)
Transition of Care [facility]) - Initial/Assessment Note    Patient Details  Name: Austin [name] MRN: [id number] Date of Birth: 1935/07/17  Transition [facility]) CM/SW Contact:    Zenon Mayo, RN Phone Number: 07/13/2021, 12:46 PM  Clinical Narrative:                 Patient is from home with wife, NCM offered choice for HHPT, he states he does not have a preference. NCM made referral to [facility] with [facility] for [place]. He is able to take referral soc will begin 24 to 48 hrs post dc.  Patient will also need a BSC, is ok with [employer] supplying.  NCM made referral to [place] with [employer], they will bring the bsc to the room prior to dc. He has transportation home today.  Expected Discharge Plan: [place] Barriers to Discharge: No Barriers Identified   Patient Goals and CMS Choice Patient states their goals for this hospitalization and ongoing recovery are:: return home with wife CMS Medicare.gov Compare Post Acute Care list provided to:: Patient Choice offered to / list presented to : Patient  Expected Discharge Plan and Services Expected Discharge Plan: [place] In-house Referral: NA Discharge Planning Services: CM Consult Post Acute Care Choice: Home Health, Durable Medical Equipment Living arrangements for the past 2 months: Single Family Home Expected Discharge Date: 07/13/21               DME Arranged: Bedside commode DME Agency: [employer] Date DME Agency Contacted: 07/13/21 Time DME Agency Contacted: 1244 Representative spoke with at DME Agency: [place]: PT [place]: [facility] [place] Date [place]: 07/13/21 Time [place]: 1244 Representative spoke with at [place] Arrangements/Services Living arrangements for the past 2 months: Single Family Home Lives with:: Spouse Patient language and need for interpreter reviewed:: Yes Do you feel safe going back to  the place where you live?: Yes      Need for Family Participation in Patient Care: Yes (Comment) Care giver support system in place?: Yes (comment)   Criminal Activity/Legal Involvement Pertinent to Current Situation/Hospitalization: No - Comment as needed  Activities of Daily Living      Permission Sought/Granted                  Emotional Assessment Appearance:: Appears stated age Attitude/Demeanor/Rapport: Engaged Affect (typically observed): Appropriate Orientation: : Oriented to Situation, Oriented to  Time, Oriented to Place, Oriented to Self Alcohol / Substance Use: Not Applicable Psych Involvement: No (comment)  Admission diagnosis:  Hypoxia [R09.02] Hypertensive emergency [I16.1] Acute on chronic diastolic CHF (congestive heart failure) ([place]) [I50.33] Patient Active Problem List   Diagnosis Date Noted   Acute on chronic diastolic CHF (congestive heart failure) ([place]) 07/09/2021   Dyspnea 06/01/2021   Upper airway cough syndrome 04/06/2021   Porokeratosis 03/26/2021   Pulmonary nodule 1 cm or greater in diameter 10/22/2020   Mass of right lung 24/04/7352   Acute diastolic CHF (congestive heart failure) ([place]) 09/17/2020   Acute exacerbation of CHF (congestive heart failure) ([place]) 08/04/2019   CKD (chronic kidney disease), stage V ([place]) 08/04/2019   Elevated troponin 08/04/2019   Hypokalemia 08/04/2019   Hypertensive urgency 03/28/2019   Elevated serum immunoglobulin free light chains 02/20/2019   Gastrointestinal hemorrhage with melena    Melena 11/03/2016   Carotid artery stenosis 05/10/2010   Diabetes mellitus with renal complications ([place])  09/19/2007   Iron deficiency anemia due to chronic blood loss 03/13/2007   GERD 03/13/2007   Dyslipidemia 03/05/2007   Essential hypertension 03/05/2007   DISEASE, CEREBROVASCULAR NEC 03/05/2007   PROSTATE CANCER, HX OF 03/05/2007   PCP:  Dorothyann Peng, NP Pharmacy:   [id number] - [place], [place] [address]. [place] [address] Phone: [phone] Fax: [phone]     Social Determinants of Health (SDOH) Interventions    Readmission Risk Interventions Readmission Risk Prevention Plan 07/13/2021  Transportation Screening Complete  PCP or Specialist Appt within 3-5 Days Complete  HRI or [place] Complete  Social Work Consult for [place] Planning/Counseling Complete  Palliative Care Screening Not Applicable  Medication Review Press photographer) Complete  Some recent data might be hidden

## 2021-07-13 NOTE — Progress Notes (Signed)
Heart Failure Nurse Navigator Progress Note  No HV TOC planned, SCr 4.81. Pt has follow up appt with nephrology tomorrow (11/23).  Pricilla Holm, MSN, RN Heart Failure Nurse Navigator [phone]

## 2021-07-14 DIAGNOSIS — N185 Chronic kidney disease, stage 5: Secondary | ICD-10-CM | POA: Diagnosis not present

## 2021-07-14 DIAGNOSIS — I5031 Acute diastolic (congestive) heart failure: Secondary | ICD-10-CM | POA: Diagnosis not present

## 2021-07-14 DIAGNOSIS — E1122 Type 2 diabetes mellitus with diabetic chronic kidney disease: Secondary | ICD-10-CM | POA: Diagnosis not present

## 2021-07-14 DIAGNOSIS — R778 Other specified abnormalities of plasma proteins: Secondary | ICD-10-CM | POA: Diagnosis not present

## 2021-07-14 LAB — GLUCOSE, CAPILLARY
Glucose-Capillary: 192 mg/dL — ABNORMAL HIGH (ref 70–99)
Glucose-Capillary: 129 mg/dL — ABNORMAL HIGH (ref 70–99)
Glucose-Capillary: 123 mg/dL — ABNORMAL HIGH (ref 70–99)
Glucose-Capillary: 160 mg/dL — ABNORMAL HIGH (ref 70–99)

## 2021-07-14 LAB — RENAL FUNCTION PANEL
Albumin: 2.6 g/dL — ABNORMAL LOW (ref 3.5–5.0)
Anion gap: 11 (ref 5–15)
BUN: 66 mg/dL — ABNORMAL HIGH (ref 8–23)
CO2: 22 mmol/L (ref 22–32)
Calcium: 8.4 mg/dL — ABNORMAL LOW (ref 8.9–10.3)
Chloride: 106 mmol/L (ref 98–111)
Creatinine, Ser: 4.77 mg/dL — ABNORMAL HIGH (ref 0.61–1.24)
GFR, Estimated: 11 mL/min — ABNORMAL LOW (ref >60)
Glucose, Bld: 139 mg/dL — ABNORMAL HIGH (ref 70–99)
Phosphorus: 4.2 mg/dL (ref 2.5–4.6)
Potassium: 3.5 mmol/L (ref 3.5–5.1)
Sodium: 139 mmol/L (ref 135–145)

## 2021-07-14 NOTE — Progress Notes (Signed)
Patient ID: Austin [name], male   DOB: 1935-02-16, 86 y.o.   MRN: [id number] [facility] Progress Note   Assessment/ Plan:   1. Acute kidney Injury on chronic kidney disease stage V: Baseline creatinine usually around 3.7-4.0 and current worsening likely hemodynamically mediated in the setting of volume overload.  He continues to maintain good urine output overnight with net negative fluid balance again.  Discharge held yesterday after he developed nausea and some chest discomfort with coughing-nausea and chest discomfort have resolved but he continues to have some intermittent cough.  I will set him up for outpatient follow-up with nephrology in 2 weeks. 2.  Volume overload/pulmonary edema: Admitted after failure of titrating outpatient diuretic therapy and appears to be improving with ongoing intravenous diuresis based on his subjective assessment.  He appears to have continued response to diuretics noted overnight input/output quantification. 3.  Hypertension: Blood pressure marginally elevated likely compounded by volume status-monitor with ongoing diuresis. 4.  Anemia of chronic disease: Without overt blood loss and downtrending hemoglobin and hematocrit noted in the setting of iron deficiency; status post intravenous Feraheme yesterday.  Subjective:   Denies any acute events overnight after having some nausea and chest discomfort with coughing.  Slept without problems off BiPAP and without oxygen supplementation overnight.   Objective:   BP (!) 168/59 (BP Location: Right Arm)   Pulse (!) 53   Temp 98.8 F (37.1 C) (Oral)   Resp 17   Ht 5\' 7"  (1.702 m)   Wt 81.4 kg   SpO2 95%   BMI 28.11 kg/m   Intake/Output Summary (Last 24 hours) at 07/14/2021 1287 Last data filed at 07/14/2021 8676 Gross per 24 hour  Intake 700 ml  Output 3100 ml  Net -2400 ml   Weight change: -1.155 kg  Physical Exam: Gen: Sitting up in recliner, not on oxygen supplementation CVS: Pulse regular  rhythm, normal rate, S1 and S2 normal Resp: Fine rales left base otherwise clear to auscultation without rhonchi/wheeze Abd: Soft, obese, nontender, bowel sounds normal Ext: Trace bilateral lower extremity edema  Imaging: No results found.  Labs: BMET Recent Labs  Lab 07/08/21 2358 07/09/21 0908 07/10/21 0246 07/11/21 0235 07/12/21 0230 07/13/21 0312 07/14/21 0145  NA 144 143 142 142 142 141 139  K 3.3* 3.3* 3.7 3.7 3.6 3.3* 3.5  CL 113* 114* 112* 113* 112* 109 106  CO2 18* 19* 20* 21* 21* 22 22  GLUCOSE 192* 162* 151* 144* 151* 128* 139*  BUN 45* 46* 48* 55* 60* 65* 66*  CREATININE 4.02* 4.04* 4.52* 4.77* 4.86* 4.81* 4.77*  CALCIUM 9.0 8.7* 8.5* 8.1* 8.3* 8.5* 8.4*  PHOS  --   --   --   --   --  4.6 4.2   CBC Recent Labs  Lab 07/08/21 2358 07/09/21 0333 07/10/21 0246 07/11/21 0235 07/11/21 0608 07/12/21 0230 07/13/21 0312  WBC 12.0*   < > 10.0 6.8  --  7.6 6.6  NEUTROABS 10.6*  --   --   --   --   --   --   HGB 9.0*   < > 8.3* 6.9* 7.3* 7.1* 7.1*  HCT 28.6*   < > 26.5* 22.0* 22.5* 22.1* 21.8*  MCV 83.4   < > 82.8 84.0  --  84.0 82.3  PLT 297   < > 180 189  --  157 197   < > = values in this interval not displayed.    Medications:     amLODipine  10 mg Oral Daily   atorvastatin  20 mg Oral Daily   cloNIDine  0.3 mg Oral TID   clopidogrel  75 mg Oral Q supper   doxazosin  2 mg Oral QHS   heparin  5,000 Units Subcutaneous Q8H   hydrocortisone  1 application Rectal BID   insulin aspart  0-6 Units Subcutaneous TID WC   isosorbide-hydrALAZINE  1 tablet Oral TID   metoprolol succinate  100 mg Oral Daily   pantoprazole  40 mg Oral Daily   potassium chloride  20 mEq Oral BID   sodium chloride flush  3 mL Intravenous Q12H   torsemide  40 mg Oral Daily   Elmarie Shiley, MD 07/14/2021, 7:28 AM

## 2021-07-14 NOTE — Progress Notes (Signed)
Mobility Specialist Progress Note:   07/14/21 1121  Mobility  Activity Ambulated in hall  Level of Assistance Modified independent, requires aide device or extra time  Assistive Device None  Distance Ambulated (ft) 150 ft  Mobility Ambulated with assistance in hallway  Mobility Response Tolerated well  Mobility performed by Mobility specialist  Bed Position Chair  $Mobility charge 1 Mobility   Pre- Mobility: 99% SpO2 Post Mobility: 93% SpO2  Pt received in chair willing to participate in mobility. No complaints of pain or shortness of breath. Pt returned to chair with call bell in reach and all needs met  [facility] Louellen Haldeman Mobility Specialist Primary Phone [phone] Secondary Phone [phone]

## 2021-07-14 NOTE — Progress Notes (Signed)
Patient requested to sleep with bipap because he has been using this since he's been here, he says it helps his cough. RN discussed this with the respiratory therapist and noticed that the order was discontinued this morning. RT and RN agreed that patient should try not to use bipap tonight because he is supposed to go home tomorrow and he does not have bipap nor oxygen at home. RN explained to patient that we will see how he does without the oxygen and bipap, but if needed we would use it. Patient agreed, but is worried about getting [facility] like he was when he came into the hospital. Will continue to monitor and assess patient respiratory needs.

## 2021-07-14 NOTE — Discharge Summary (Signed)
Physician Discharge Summary  Austin [name] PNT:[id number] DOB: 27-Nov-1934 DOA: 07/08/2021  PCP: Dorothyann Peng, NP  Admit date: 07/08/2021 Discharge date: 07/14/2021  Admitted From: home Discharge disposition: home   Recommendations for Outpatient Follow-Up:   Follow-up with nephrology in 2 weeks, as recommended by nephrologist. BMP w/in 1 week Needs outpatient sleep study   Discharge Diagnosis:   Principal Problem:   Diabetes mellitus with renal complications ([place]) Active Problems:   Hypertensive urgency   CKD (chronic kidney disease), stage V ([facility])   Elevated troponin   Hypokalemia   Pulmonary nodule 1 cm or greater in diameter   Acute on chronic diastolic CHF (congestive heart failure) ([place])    Discharge Condition: Improved.  Diet recommendation: Low sodium, heart healthy.  Carbohydrate-modified.    Wound care: None.  Code status: Full.   History of Present Illness:   Austin [name] is a pleasant 86 y.o. male with medical history significant for type 2 diabetes mellitus, resistant hypertension, CKD IV or V, CHF, chronic cough, and pulmonary nodule, now presenting to the emergency department for evaluation of shortness of breath.  Patient reports a few days of progressive dyspnea and increased bilateral leg swelling despite recent increase in torsemide.  Legs are now swollen above the knees and he also feels some swelling in his lower abdomen now.  Shortness of breath is worse at night.  He denies any dietary indiscretion but may have been drinking more fluids due to dry mouth which she attributes to increased torsemide.  He denies chest pain, fevers, or chills.   Hospital Course by Problem:   Acute kidney Injury on chronic kidney disease stage V: Baseline creatinine usually around 3.7-4.0 and current worsening likely hemodynamically mediated in the setting of volume overload.  Increased urine output overnight with the addition of metolazone and essentially stable  renal function seen on labs this morning.  He is being discharged on torsemide as below.  We will follow-up with nephrology in 2 weeks. - may need metolazone 2 or 3 days a week based on his outpatient volume assessment--defer to renal   Volume overload/pulmonary edema: Admitted after failure of titrating outpatient diuretic therapy and appears to be improving with ongoing intravenous diuresis based on his subjective assessment.  Hypertension: Blood pressure marginally elevated likely compounded by volume status -resume home meds   Anemia of chronic disease: Without overt blood loss and downtrending hemoglobin and hematocrit noted, iron stores yesterday indicate significant deficiency with an iron saturation of 5% and ferritin of 43.   -s/p dose of Feraheme today.   Type II DM  - A1c was 6.8% in January 2022  - resume home meds   Hypokalemia  -repleted   Elevated troponin  - HS troponin is 49 without chest pain  - Likely related to severe HTN and respiratory distress rather than ACS    Pulmonary nodule  - Followed by pulmonology with plan for repeat CT in April 2023     constipation -bowel regimen -suppository  Please note that patient was medically ready for discharge, discharge summary was completed yesterday however after the fact, patient complained of some nausea, cough with pain.  He was started on [place] fashion.  Patient was kept overnight for observation per patient's request as he was apprehensive about going home.  Patient seen and examined this morning, he is doing well.  He himself tells me that he is now more comfortable to go home as he was afraid to go home yesterday.  He is being discharged in stable condition.  Medical Consultants:   renal   Discharge Exam:   Vitals:   07/14/21 0406 07/14/21 0712  BP: (!) 152/64 (!) 168/59  Pulse: (!) 55 (!) 53  Resp: 16 17  Temp: 98.9 F (37.2 C) 98.8 F (37.1 C)  SpO2: 92% 95%   Vitals:   07/13/21 2326 07/14/21 0406  07/14/21 0409 07/14/21 0712  BP: (!) 160/62 (!) 152/64  (!) 168/59  Pulse: (!) 55 (!) 55  (!) 53  Resp: _0 Temp: 98.9 F (37.2 C) 98.9 F (37.2 C)  98.8 F (37.1 C)  TempSrc: Oral Oral  Oral  SpO2: 97% 92%  95%  Weight:   81.4 kg   Height:        General exam: Appears calm and comfortable  Respiratory system: Clear to auscultation. Respiratory effort normal. Cardiovascular system: S1 & S2 heard, RRR. No JVD, murmurs, rubs, gallops or clicks. No pedal edema. Gastrointestinal system: Abdomen is nondistended, soft and nontender. No organomegaly or masses felt. Normal bowel sounds heard. Central nervous system: Alert and oriented. No focal neurological deficits. Extremities: Symmetric 5 x 5 power. Skin: No rashes, lesions or ulcers.  Psychiatry: Judgement and insight appear normal. Mood & affect appropriate.    The results of significant diagnostics from this hospitalization (including imaging, microbiology, ancillary and laboratory) are listed below for reference.     Procedures and Diagnostic Studies:   DG Chest 2 View  Result Date: 07/09/2021 CLINICAL DATA:  Shortness of breath and cough. EXAM: CHEST - 2 VIEW COMPARISON:  Chest radiograph dated 04/06/2021. FINDINGS: There is cardiomegaly with vascular congestion and edema. No focal consolidation, pleural effusion, or pneumothorax. Sclerotic calcification of the aorta. No acute osseous pathology. IMPRESSION: Cardiomegaly with vascular congestion and edema. No focal consolidation. Electronically Signed   By: Anner Crete M.D.   On: 07/09/2021 00:35     Labs:   Basic Metabolic Panel: Recent Labs  Lab 07/09/21 0333 07/09/21 0908 07/10/21 0246 07/11/21 0235 07/12/21 0230 07/13/21 0312 07/14/21 0145  NA  --    < > 142 142 142 141 139  K  --    < > 3.7 3.7 3.6 3.3* 3.5  CL  --    < > 112* 113* 112* 109 106  CO2  --    < > 20* 21* 21* 22 22  GLUCOSE  --    < > 151* 144* 151* 128* 139*  BUN  --    < > 48* 55*  60* 65* 66*  CREATININE  --    < > 4.52* 4.77* 4.86* 4.81* 4.77*  CALCIUM  --    < > 8.5* 8.1* 8.3* 8.5* 8.4*  MG 2.0  --   --   --  2.1  --   --   PHOS  --   --   --   --   --  4.6 4.2   < > = values in this interval not displayed.   GFR Estimated Creatinine Clearance: 11.8 mL/min (A) (by C-G formula based on SCr of 4.77 mg/dL (H)). Liver Function Tests: Recent Labs  Lab 07/13/21 0312 07/14/21 0145  ALBUMIN 2.6* 2.6*   No results for input(s): LIPASE, AMYLASE in the last 168 hours. No results for input(s): AMMONIA in the last 168 hours. Coagulation profile No results for input(s): INR, PROTIME in the last 168 hours.  CBC: Recent Labs  Lab 07/08/21 2358 07/09/21 0333 07/10/21 0246 07/11/21  0235 07/11/21 0608 07/12/21 0230 07/13/21 0312  WBC 12.0* 9.2 10.0 6.8  --  7.6 6.6  NEUTROABS 10.6*  --   --   --   --   --   --   HGB 9.0* 7.9* 8.3* 6.9* 7.3* 7.1* 7.1*  HCT 28.6* 25.0* 26.5* 22.0* 22.5* 22.1* 21.8*  MCV 83.4 83.3 82.8 84.0  --  84.0 82.3  PLT 297 270 180 189  --  157 197   Cardiac Enzymes: No results for input(s): CKTOTAL, CKMB, CKMBINDEX, TROPONINI in the last 168 hours. BNP: Invalid input(s): POCBNP CBG: Recent Labs  Lab 07/12/21 2124 07/13/21 0606 07/13/21 1134 07/13/21 2047 07/14/21 0605  GLUCAP 201* 139* 240* 146* 123*   D-Dimer No results for input(s): DDIMER in the last 72 hours. Hgb A1c No results for input(s): HGBA1C in the last 72 hours. Lipid Profile No results for input(s): CHOL, HDL, LDLCALC, TRIG, CHOLHDL, LDLDIRECT in the last 72 hours. Thyroid function studies No results for input(s): TSH, T4TOTAL, T3FREE, THYROIDAB in the last 72 hours.  Invalid input(s): FREET3 Anemia work up Recent Labs    07/12/21 0926  FERRITIN 43  TIBC 241*  IRON 13*   Microbiology Recent Results (from the past 240 hour(s))  Resp Panel by RT-PCR (Flu A&B, Covid) Nasopharyngeal Swab     Status: None   Collection Time: 07/08/21 11:53 PM   Specimen:  Nasopharyngeal Swab; Nasopharyngeal(NP) swabs in vial transport medium  Result Value Ref Range Status   SARS Coronavirus 2 by RT PCR NEGATIVE NEGATIVE Final    Comment: (NOTE) SARS-CoV-2 target nucleic acids are NOT DETECTED.  The SARS-CoV-2 RNA is generally detectable in upper respiratory specimens during the acute phase of infection. The lowest concentration of SARS-CoV-2 viral copies this assay can detect is 138 copies/mL. A negative result does not preclude SARS-Cov-2 infection and should not be used as the sole basis for treatment or other patient management decisions. A negative result may occur with  improper specimen collection/handling, submission of specimen other than nasopharyngeal swab, presence of viral mutation(s) within the areas targeted by this assay, and inadequate number of viral copies(<138 copies/mL). A negative result must be combined with clinical observations, patient history, and epidemiological information. The expected result is Negative.  Fact Sheet for Patients:  EntrepreneurPulse.com.au  Fact Sheet for Healthcare Providers:  IncredibleEmployment.be  This test is no t yet approved or cleared by the [place] FDA and  has been authorized for detection and/or diagnosis of SARS-CoV-2 by FDA under an Emergency Use Authorization (EUA). This EUA will remain  in effect (meaning this test can be used) for the duration of the COVID-19 declaration under Section 564(b)(1) of the Act, 21 U.S.C.section 360bbb-3(b)(1), unless the authorization is terminated  or revoked sooner.       Influenza A by PCR NEGATIVE NEGATIVE Final   Influenza B by PCR NEGATIVE NEGATIVE Final    Comment: (NOTE) The Xpert Xpress SARS-CoV-2/FLU/RSV plus assay is intended as an aid in the diagnosis of influenza from Nasopharyngeal swab specimens and should not be used as a sole basis for treatment. Nasal washings and aspirates are unacceptable for  Xpert Xpress SARS-CoV-2/FLU/RSV testing.  Fact Sheet for Patients: EntrepreneurPulse.com.au  Fact Sheet for Healthcare Providers: IncredibleEmployment.be  This test is not yet approved or cleared by the [place] FDA and has been authorized for detection and/or diagnosis of SARS-CoV-2 by FDA under an Emergency Use Authorization (EUA). This EUA will remain in effect (meaning this test can be used)  for the duration of the COVID-19 declaration under Section 564(b)(1) of the Act, 21 U.S.C. section 360bbb-3(b)(1), unless the authorization is terminated or revoked.  Performed at [facility], [place] [address] [place], [place] [address]   Respiratory (~20 pathogens) panel by PCR     Status: None   Collection Time: 07/10/21 10:20 AM   Specimen: Nasopharyngeal Swab; Respiratory  Result Value Ref Range Status   Adenovirus NOT DETECTED NOT DETECTED Final   Coronavirus 229E NOT DETECTED NOT DETECTED Final    Comment: (NOTE) The Coronavirus on the Respiratory Panel, DOES NOT test for the novel  Coronavirus (2019 nCoV)    Coronavirus HKU1 NOT DETECTED NOT DETECTED Final   Coronavirus NL63 NOT DETECTED NOT DETECTED Final   Coronavirus OC43 NOT DETECTED NOT DETECTED Final   Metapneumovirus NOT DETECTED NOT DETECTED Final   Rhinovirus / Enterovirus NOT DETECTED NOT DETECTED Final   Influenza A NOT DETECTED NOT DETECTED Final   Influenza B NOT DETECTED NOT DETECTED Final   Parainfluenza Virus 1 NOT DETECTED NOT DETECTED Final   Parainfluenza Virus 2 NOT DETECTED NOT DETECTED Final   Parainfluenza Virus 3 NOT DETECTED NOT DETECTED Final   Parainfluenza Virus 4 NOT DETECTED NOT DETECTED Final   Respiratory Syncytial Virus NOT DETECTED NOT DETECTED Final   Bordetella pertussis NOT DETECTED NOT DETECTED Final   Bordetella Parapertussis NOT DETECTED NOT DETECTED Final   Chlamydophila pneumoniae NOT DETECTED NOT DETECTED Final   Mycoplasma pneumoniae  NOT DETECTED NOT DETECTED Final    Comment: Performed at [facility], [place]. [address] [place], [place] [address]     Discharge Instructions:   Discharge Instructions     AMB Referral to [facility] Coordinaton   Complete by: As directed    Please refer to RN case manager for chronic disease management services.  Netta Cedars, MSN, RN [facility] Phone [phone]  Toll free office [phone]   Reason for Referral: [place] (Dept. specific)   Disease management services needed: Nurse Case Manager   Diagnoses of: Heart Failure   Expected date of contact: Routine - 30 Days   Diet - low sodium heart healthy   Complete by: As directed    Diet Carb Modified   Complete by: As directed    Increase activity slowly   Complete by: As directed       Allergies as of 07/14/2021       Reactions   Aspirin Other (See Comments)   High doses causes stomach ulcer and bleeding        Medication List     STOP taking these medications    predniSONE 10 MG tablet Commonly known as: DELTASONE       TAKE these medications    Accu-Chek Aviva Plus w/Device Kit Used to check blood glucose 2 times a day or PRN   Accu-Chek Guide test strip Generic drug: glucose blood USED TO CHECK BLOOD GLUCOSE TWICE A DAY OR AS NEEDED   Accu-Chek Softclix Lancets lancets USED TO CHECK BLOOD GLUCOSE TWICE A DAY OR AS NEEDED   acetaminophen 325 MG tablet Commonly known as: TYLENOL Take 325 mg by mouth 4 (four) times daily.   albuterol 108 (90 Base) MCG/ACT inhaler Commonly known as: VENTOLIN HFA Inhale 2 puffs into the lungs every 6 (six) hours as needed for wheezing or shortness of breath.   amLODipine 10 MG tablet Commonly known as: NORVASC TAKE 1 TABLET BY  MOUTH EVERY DAY   atorvastatin 20 MG tablet Commonly known as: LIPITOR TAKE 1 TABLET BY MOUTH EVERY DAY   bisacodyl 10 MG suppository Commonly known  as: DULCOLAX Place 1 suppository (10 mg total) rectally daily as needed for mild constipation.   cholecalciferol 25 MCG (1000 UNIT) tablet Commonly known as: VITAMIN D3 Take 1,000 Units by mouth daily.   cloNIDine 0.3 MG tablet Commonly known as: CATAPRES Take 1 tablet (0.3 mg total) by mouth 3 (three) times daily.   clopidogrel 75 MG tablet Commonly known as: PLAVIX TAKE 1 TABLET BY MOUTH EVERY DAY   doxazosin 2 MG tablet Commonly known as: CARDURA Take 2 mg by mouth at bedtime.   esomeprazole 40 MG capsule Commonly known as: NEXIUM TAKE 1 CAPSULE BY MOUTH EVERY DAY What changed:  how much to take how to take this   glipiZIDE 5 MG 24 hr tablet Commonly known as: GLUCOTROL XL TAKE 1 TABLET BY MOUTH EVERY DAY What changed: how to take this   hydrocortisone 2.5 % rectal cream Commonly known as: ANUSOL-HC PLACE 1 APPLICATION RECTALLY 2 TIMES A DAY. What changed: See the new instructions.   isosorbide-hydrALAZINE 20-37.5 MG tablet Commonly known as: BIDIL Take 1 tablet by mouth 3 (three) times daily.   Klor-Con M10 10 MEQ tablet Generic drug: potassium chloride Take 2 tablets (20 mEq total) by mouth 2 (two) times daily. What changed: how much to take   lactulose 10 GM/15ML solution Commonly known as: Healy 15 ML BY MOUTH 2 TIMES DAILY AS NEEDED FOR MILD CONSTIPATION. What changed: See the new instructions.   metoprolol succinate 100 MG 24 hr tablet Commonly known as: TOPROL-XL Take 1 tablet (100 mg total) by mouth daily. Take with or immediately following a meal.   polyethylene glycol 17 g packet Commonly known as: MIRALAX / GLYCOLAX Take 17 g by mouth daily as needed for mild constipation.   SYSTANE BALANCE OP Place 1 drop into both eyes daily as needed (dry eyes).   Torsemide 40 MG Tabs Take 40 mg by mouth daily. What changed:  medication strength how much to take when to take this   triamcinolone cream 0.1 % Commonly known as:  KENALOG Apply 1 application topically daily as needed (dry/irritated skin).               Durable Medical Equipment  (From admission, onward)           Start     Ordered   07/13/21 1222  For home use only DME Bedside commode  Once       Question:  Patient needs a bedside commode to treat with the following condition  Answer:  Weakness   07/13/21 1221            Follow-up Information     Nafziger, Tommi Rumps, NP. Go on 07/21/2021.   Specialty: Family Medicine Why: _0 :30pm Contact information: [place] [address] [phone]         Claudia Desanctis, MD Follow up.   Specialty: Nephrology Why: keep appointment tomm Contact information: [place] [address] [phone]         [facility] Follow up.   Specialty: Home Health Services Why: HHPT Contact information: [place] [address] [phone]                  Time coordinating discharge: 36 min  Signed:  Darliss Cheney MD  [facility] 07/14/2021, 10:17 AM

## 2021-07-19 ENCOUNTER — Telehealth: Payer: Self-pay

## 2021-07-19 NOTE — Telephone Encounter (Signed)
Levada Dy from [facility] called requesting new verbal orders for start date 07/19/21 Call back # [phone]

## 2021-07-20 ENCOUNTER — Telehealth: Payer: Self-pay | Admitting: Adult Health

## 2021-07-20 NOTE — Telephone Encounter (Signed)
Austin [name] PT with [facility] is calling patient  had start of care yesterday and sean needs verbal orders for PT for this patient  2x3 and then 1 x 5 also there is drug interaction between clonidine and metoprolol succinate also clopidogrel and esomeprazole magnesium  has drug interaction

## 2021-07-21 ENCOUNTER — Ambulatory Visit: Payer: Medicare PPO | Admitting: Adult Health

## 2021-07-21 ENCOUNTER — Encounter: Payer: Self-pay | Admitting: Adult Health

## 2021-07-21 VITALS — BP 140/80 | HR 94 | Temp 99.3°F | Ht 67.0 in | Wt 179.0 lb

## 2021-07-21 DIAGNOSIS — I10 Essential (primary) hypertension: Secondary | ICD-10-CM

## 2021-07-21 DIAGNOSIS — N184 Chronic kidney disease, stage 4 (severe): Secondary | ICD-10-CM

## 2021-07-21 DIAGNOSIS — G479 Sleep disorder, unspecified: Secondary | ICD-10-CM | POA: Diagnosis not present

## 2021-07-21 DIAGNOSIS — E1122 Type 2 diabetes mellitus with diabetic chronic kidney disease: Secondary | ICD-10-CM | POA: Diagnosis not present

## 2021-07-21 DIAGNOSIS — E876 Hypokalemia: Secondary | ICD-10-CM

## 2021-07-21 DIAGNOSIS — I5033 Acute on chronic diastolic (congestive) heart failure: Secondary | ICD-10-CM

## 2021-07-21 LAB — BASIC METABOLIC PANEL
BUN: 78 mg/dL — ABNORMAL HIGH (ref 6–23)
CO2: 28 mEq/L (ref 19–32)
Calcium: 9.4 mg/dL (ref 8.4–10.5)
Chloride: 101 mEq/L (ref 96–112)
Creatinine, Ser: 5.06 mg/dL (ref 0.40–1.50)
GFR: 9.85 mL/min — CL (ref >60.00)
Glucose, Bld: 108 mg/dL — ABNORMAL HIGH (ref 70–99)
Potassium: 3.2 mEq/L — ABNORMAL LOW (ref 3.5–5.1)
Sodium: 140 mEq/L (ref 135–145)

## 2021-07-21 NOTE — Telephone Encounter (Signed)
Left message to return phone call.

## 2021-07-21 NOTE — Telephone Encounter (Signed)
Okay for verbal orders? Please advise 

## 2021-07-21 NOTE — Patient Instructions (Signed)
It was great seeing you today and I am glad you are feeling better.   I am going to check some blood work on you today and have referred you to a sleep study   Please follow up with your kidney doctor

## 2021-07-21 NOTE — Progress Notes (Signed)
Subjective:    Patient ID: Austin [name], male    DOB: 01/16/1935, 86 y.o.   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  He presents to the office today for TCM visit  Admit Date 07/08/2021 Discharge Date 07/14/2021  Presented to the emergency room for evaluation of shortness of breath.  He reports a few days of progressive dyspnea and increased bilateral leg swelling despite recent increase in torsemide.  His legs were swollen above the knees and also feels as though he has some swelling in his lower abdomen as well.  Shortness of breath was worse at night.  He denied any dietary indiscretion but may have been drinking more fluids due to dry mouth which he attributed to increased torsemide.  He denied chest pain, fevers, or chills  Hospital Course  Acute Kidney Injury on Chronic Kidney Disease stage V  His baseline creatinine is usually around 3.7-4.0.  On admission he was 4.86.  This was likely due to volume overload.  He had increased urine output overnight with the addition of Metolazone essentially stable renal function upon discharge.  He was advised to follow-up with nephrology in 2 weeks, may need Metolazone 2 or 3 days a week based on his outpatient volume assessment but this will be deferred to nephrology  Volume overload/pulmonary edema -Admitted after failure of titrating outpatient diuretic therapy and appears to be improving with ongoing intravenous diuresis based on subjective assessment  Hypertension  -Blood pressure was marginally elevated which was  likely compounded by volume status.  He was resumed on home medications  Anemia of chronic disease -Without overt blood loss and downtrending hemoglobin and hematocrit noted, iron stores on admission indicate significant deficiency with an iron saturation of 5% and ferritin of 43%.  He received a dose of Feraheme during this hospital admission  Type 2 diabetes -A1c 6.3 in November 2022.  He was resumed on home medications  Hypokalemia  - repleted during hospital admission   Elevated troponin -Troponin at 49 without chest pain.  Likely related to severe hypertension and respiratory distress rather than ACS  Pulmonary nodule -Followed by pulmonary with plan to repeat CT in April 2023  Today he reports that he feels back to baseline, his weight has stayed stable at 179 pounds.  He plans on following up with nephrology as soon as he can get an appointment.  Since being discharged from the hospital he has not experienced any fevers, chills, dizziness, or lightheadedness.  Does have some mild shortness of breath especially with exertion.  He has no acute issues that he would like to discuss today  On his discharge paperwork it was advised to follow-up for outpatient sleep study.  Patient does not believe he has sleep apnea but does have trouble falling asleep.  He is interested in having a sleep study done  Review of Systems  Constitutional: Negative.   HENT: Negative.    Respiratory:  Positive for shortness of breath.   Gastrointestinal: Negative.   Genitourinary: Negative.   Musculoskeletal: Negative.   Skin: Negative.   Neurological: Negative.   Hematological: Negative.   Psychiatric/Behavioral:  Positive for sleep disturbance.   All  other systems reviewed and are negative. Past Medical History:  Diagnosis Date   ANEMIA DUE TO CHRONIC BLOOD LOSS 03/13/2007   CAROTID ARTERY STENOSIS 05/10/2010   CHF (congestive heart failure) ([place])    DIABETES MELLITUS, TYPE II 09/19/2007   DISEASE,  CEREBROVASCULAR NEC 03/05/2007   GERD 03/13/2007   HYPERLIPIDEMIA 03/05/2007   HYPERTENSION 03/05/2007   HYPOKALEMIA 11/09/2009   KNEE PAIN, RIGHT 11/09/2009   PROSTATE CANCER, HX OF 03/05/2007   RENAL DISEASE, CHRONIC 02/03/2009    Social History   Socioeconomic History   Marital status: Married    Spouse name: Not on file   Number of children: Not on file   Years of education: Not on file   Highest education level: Not on file  Occupational History   Not on file  Tobacco Use   Smoking status: Former    Packs/day: 1.00    Years: 10.00    Pack years: 10.00    Types: Cigarettes    Quit date: 08/22/1974    Years since quitting: 46.9   Smokeless tobacco: Never  Vaping Use   Vaping Use: Never used  Substance and Sexual Activity   Alcohol use: No    Alcohol/week: 0.0 standard drinks   Drug use: No   Sexual activity: Not on file  Other Topics Concern   Not on file  Social History Narrative   Retired - Environmental consultant    Married 58 years       He enjoys traveling    Investment banker, operational of Radio broadcast assistant Strain: Low Risk    Difficulty of Paying Living Expenses: Not hard at all  Food Insecurity: No Food Insecurity   Worried About Charity fundraiser in the Last Year: Never true   Arboriculturist in the Last Year: Never true  Transportation Needs: No Transportation Needs   Lack of Transportation (Medical): No   Lack of Transportation (Non-Medical): No  Physical Activity: Inactive   Days of Exercise per Week: 0 days   Minutes of Exercise per Session: 0 min  Stress: No Stress Concern Present   Feeling of Stress : Not at all  Social Connections: Moderately Integrated   Frequency of Communication with Friends and Family: Twice a week   Frequency of Social Gatherings with Friends and Family: Twice a week   Attends Religious Services: More than 4 times per year   Active Member of [employer] or Organizations: No   Attends Archivist Meetings: Never   Marital  Status: Married  Human resources officer Violence: Not on file    Past Surgical History:  Procedure Laterality Date   CAROTID ARTERY ANGIOPLASTY Right Oct. 10, 2001   ESOPHAGOGASTRODUODENOSCOPY (EGD) WITH PROPOFOL N/A 11/04/2016   Procedure: ESOPHAGOGASTRODUODENOSCOPY (EGD) WITH PROPOFOL;  Surgeon: Otis Brace, MD;  Location: [facility];  Service: Gastroenterology;  Laterality: N/A;   LAPAROSCOPIC APPENDECTOMY  02/20/2012   Procedure: APPENDECTOMY LAPAROSCOPIC;  Surgeon: Stark Klein, MD;  Location: [facility] OR;  Service: General;  Laterality: N/A;   PROSTATE SURGERY     prostatectomy    Family History  Problem Relation Age of Onset   Hypertension Mother    Cancer Father        Mesothelioma    Stomach cancer Brother    Cancer Brother    Cancer - Cervical Brother    Cancer Brother    Diabetes Brother    Esophageal cancer Neg Hx    Colon cancer Neg Hx  Pancreatic cancer Neg Hx     Allergies  Allergen Reactions   Aspirin Other (See Comments)    High doses causes stomach ulcer and bleeding    Current Outpatient Medications on File Prior to Visit  Medication Sig Dispense Refill   ACCU-CHEK GUIDE test strip USED TO CHECK BLOOD GLUCOSE TWICE A DAY OR AS NEEDED 100 strip 1   Accu-Chek Softclix Lancets lancets USED TO CHECK BLOOD GLUCOSE TWICE A DAY OR AS NEEDED 100 each 6   acetaminophen (TYLENOL) 325 MG tablet Take 325 mg by mouth 4 (four) times daily.     albuterol (VENTOLIN HFA) 108 (90 Base) MCG/ACT inhaler Inhale 2 puffs into the lungs every 6 (six) hours as needed for wheezing or shortness of breath. 8 g 1   amLODipine (NORVASC) 10 MG tablet TAKE 1 TABLET BY MOUTH EVERY DAY 90 tablet 1   atorvastatin (LIPITOR) 20 MG tablet TAKE 1 TABLET BY MOUTH EVERY DAY (Patient taking differently: Take 20 mg by mouth daily.) 90 tablet 3   bisacodyl (DULCOLAX) 10 MG suppository Place 1 suppository (10 mg total) rectally daily as needed for mild constipation. 12 suppository 0   Blood Glucose  Monitoring Suppl (ACCU-CHEK AVIVA PLUS) w/Device KIT Used to check blood glucose 2 times a day or PRN 1 kit 0   cholecalciferol (VITAMIN D3) 25 MCG (1000 UNIT) tablet Take 1,000 Units by mouth daily.     cloNIDine (CATAPRES) 0.3 MG tablet Take 1 tablet (0.3 mg total) by mouth 3 (three) times daily. 60 tablet 0   clopidogrel (PLAVIX) 75 MG tablet TAKE 1 TABLET BY MOUTH EVERY DAY 90 tablet 3   doxazosin (CARDURA) 2 MG tablet Take 2 mg by mouth at bedtime.   3   esomeprazole (NEXIUM) 40 MG capsule TAKE 1 CAPSULE BY MOUTH EVERY DAY (Patient taking differently: 80 mg daily.) 90 capsule 3   glipiZIDE (GLUCOTROL XL) 5 MG 24 hr tablet TAKE 1 TABLET BY MOUTH EVERY DAY (Patient taking differently: 5 mg daily.) 90 tablet 0   hydrocortisone (ANUSOL-HC) 2.5 % rectal cream PLACE 1 APPLICATION RECTALLY 2 TIMES A DAY. (Patient taking differently: Place 1 application rectally 2 (two) times daily.) 30 g 0   isosorbide-hydrALAZINE (BIDIL) 20-37.5 MG tablet Take 1 tablet by mouth 3 (three) times daily.     KLOR-CON M10 10 MEQ tablet Take 2 tablets (20 mEq total) by mouth 2 (two) times daily. (Patient taking differently: Take 10 mEq by mouth 2 (two) times daily.) 120 tablet 0   lactulose (CHRONULAC) 10 GM/15ML solution TAKE 15 ML BY MOUTH 2 TIMES DAILY AS NEEDED FOR MILD CONSTIPATION. (Patient taking differently: Take 10 g by mouth 2 (two) times daily as needed for mild constipation.) 237 mL 2   metoprolol succinate (TOPROL-XL) 100 MG 24 hr tablet Take 1 tablet (100 mg total) by mouth daily. Take with or immediately following a meal. 30 tablet 0   polyethylene glycol (MIRALAX / GLYCOLAX) 17 g packet Take 17 g by mouth daily as needed for mild constipation.     Propylene Glycol (SYSTANE BALANCE OP) Place 1 drop into both eyes daily as needed (dry eyes).     torsemide 40 MG TABS Take 40 mg by mouth daily. 30 tablet 0   triamcinolone cream (KENALOG) 0.1 % Apply 1 application topically daily as needed (dry/irritated skin).      Current Facility-Administered Medications on File Prior to Visit  Medication Dose Route Frequency Provider Last Rate Last Admin   ammonium lactate (  LAC-HYDRIN) 12 % lotion   Topical PRN Boneta Lucks P, DPM        BP 140/80   Pulse 94   Temp 99.3 F (37.4 C) (Oral)   Ht '5\' 7"'  (1.702 m)   Wt 179 lb (81.2 kg)   SpO2 97%   BMI 28.04 kg/m       Objective:   Physical Exam Vitals and nursing note reviewed.  Constitutional:      Appearance: Normal appearance.  Cardiovascular:     Rate and Rhythm: Normal rate and regular rhythm.     Pulses: Normal pulses.     Heart sounds: Normal heart sounds.  Pulmonary:     Effort: Pulmonary effort is normal.     Breath sounds: Normal breath sounds.  Abdominal:     General: Abdomen is flat.     Palpations: Abdomen is soft.  Musculoskeletal:     Right lower leg: No edema.     Left lower leg: No edema.  Neurological:     General: No focal deficit present.     Mental Status: He is alert and oriented to person, place, and time.  Psychiatric:        Mood and Affect: Mood normal.        Behavior: Behavior normal.        Thought Content: Thought content normal.        Judgment: Judgment normal.          Assessment & Plan:  1. Type 2 diabetes mellitus with stage 4 chronic kidney disease, without long-term current use of insulin ([place]) - Continue home meds - Basic Metabolic Panel; Future - Basic Metabolic Panel  2. CKD (chronic kidney disease), stage IV [facility]) -Reviewed hospital notes, discharge instructions, labs, and imaging.  All questions answered to the best of my ability - Follow up with Nephrology as directed - Basic Metabolic Panel; Future - Basic Metabolic Panel  3. Acute on chronic diastolic congestive heart failure ([place]) - Continue with Torsemide as directed  4. Essential hypertension - At goal. No change in medications  - Basic Metabolic Panel; Future - Basic Metabolic Panel  5. Hypokalemia  - Basic Metabolic  Panel; Future - Basic Metabolic Panel   6. Sleep disturbance  - Ambulatory referral to Pulmonology  Dorothyann Peng, NP

## 2021-07-22 NOTE — Telephone Encounter (Signed)
Hi Cory, FYI -- I was on call last night -- Mr Avants's creat is 5.06 now. It was in upper 4's before. Hope you're well! AP

## 2021-07-25 ENCOUNTER — Other Ambulatory Visit: Payer: Self-pay | Admitting: Adult Health

## 2021-07-28 DIAGNOSIS — I10 Essential (primary) hypertension: Secondary | ICD-10-CM | POA: Diagnosis not present

## 2021-07-28 DIAGNOSIS — I5030 Unspecified diastolic (congestive) heart failure: Secondary | ICD-10-CM | POA: Diagnosis not present

## 2021-07-28 DIAGNOSIS — I351 Nonrheumatic aortic (valve) insufficiency: Secondary | ICD-10-CM | POA: Diagnosis not present

## 2021-07-29 NOTE — Telephone Encounter (Signed)
Called Levada Dy again left another message for a return call.

## 2021-07-29 NOTE — Telephone Encounter (Signed)
Not sure if these orders are in connection with other verbal orders needed for [facility].

## 2021-07-29 NOTE — Telephone Encounter (Signed)
Tried calling provided number again to provide VO no answer.

## 2021-07-30 DIAGNOSIS — E876 Hypokalemia: Secondary | ICD-10-CM | POA: Diagnosis not present

## 2021-07-30 DIAGNOSIS — R918 Other nonspecific abnormal finding of lung field: Secondary | ICD-10-CM | POA: Diagnosis not present

## 2021-07-30 DIAGNOSIS — N184 Chronic kidney disease, stage 4 (severe): Secondary | ICD-10-CM | POA: Diagnosis not present

## 2021-07-30 DIAGNOSIS — E1122 Type 2 diabetes mellitus with diabetic chronic kidney disease: Secondary | ICD-10-CM | POA: Diagnosis not present

## 2021-07-30 DIAGNOSIS — D631 Anemia in chronic kidney disease: Secondary | ICD-10-CM | POA: Diagnosis not present

## 2021-07-30 DIAGNOSIS — N2581 Secondary hyperparathyroidism of renal origin: Secondary | ICD-10-CM | POA: Diagnosis not present

## 2021-07-30 DIAGNOSIS — R809 Proteinuria, unspecified: Secondary | ICD-10-CM | POA: Diagnosis not present

## 2021-07-30 DIAGNOSIS — I5032 Chronic diastolic (congestive) heart failure: Secondary | ICD-10-CM | POA: Diagnosis not present

## 2021-07-30 DIAGNOSIS — I129 Hypertensive chronic kidney disease with stage 1 through stage 4 chronic kidney disease, or unspecified chronic kidney disease: Secondary | ICD-10-CM | POA: Diagnosis not present

## 2021-08-03 ENCOUNTER — Telehealth: Payer: Self-pay | Admitting: *Deleted

## 2021-08-03 DIAGNOSIS — H25013 Cortical age-related cataract, bilateral: Secondary | ICD-10-CM | POA: Diagnosis not present

## 2021-08-03 DIAGNOSIS — H2513 Age-related nuclear cataract, bilateral: Secondary | ICD-10-CM | POA: Diagnosis not present

## 2021-08-03 DIAGNOSIS — E113293 Type 2 diabetes mellitus with mild nonproliferative diabetic retinopathy without macular edema, bilateral: Secondary | ICD-10-CM | POA: Diagnosis not present

## 2021-08-03 DIAGNOSIS — H5203 Hypermetropia, bilateral: Secondary | ICD-10-CM | POA: Diagnosis not present

## 2021-08-03 NOTE — Chronic Care Management (AMB) (Signed)
°  Chronic Care Management   Outreach Note  08/03/2021 Name: Austin [name] MRN: [id number] DOB: 01/25/1935  Austin [name] is a 86 y.o. year old male who is a primary care patient of Austin Peng, NP. I reached out to Austin [name] by phone today in response to a referral sent by Austin [name] primary care provider.  An unsuccessful telephone outreach was attempted today. The patient was referred to the case management team for assistance with care management and care coordination.   Follow Up Plan: The care management team will reach out to the patient again over the next 7 days. If patient returns call to provider office, please advise to call [place] at [phone].  [facility]  Direct Dial: [phone]

## 2021-08-04 ENCOUNTER — Telehealth: Payer: Self-pay | Admitting: Emergency Medicine

## 2021-08-04 ENCOUNTER — Other Ambulatory Visit: Payer: Self-pay | Admitting: Primary Care

## 2021-08-06 NOTE — Telephone Encounter (Signed)
Spoke to patient, who stated that he received a call from our office. No voicemail was left.  I do not see record of our office attempting to contact patient.  Nothing further needed at this time.

## 2021-08-09 NOTE — Chronic Care Management (AMB) (Signed)
Chronic Care Management   Note  08/09/2021 Name: Maria Gallicchio MRN: [id number] DOB: [birth year]  Elsworth Ledin is a 86 y.o. year old male who is a primary care patient of Dorothyann Peng, NP. I reached out to Norm Salt by phone today in response to a referral sent by Mr. Trejuan Matherne PCP.  Mr. Errico was given information about Chronic Care Management services today including:  CCM service includes personalized support from designated clinical staff supervised by his physician, including individualized plan of care and coordination with other care providers 24/7 contact phone numbers for assistance for urgent and routine care needs. Service will only be billed when office clinical staff spend 20 minutes or more in a month to coordinate care. Only one practitioner may furnish and bill the service in a calendar month. The patient may stop CCM services at any time (effective at the end of the month) by phone call to the office staff. The patient is responsible for co-pay (up to 20% after annual deductible is met) if co-pay is required by the individual health plan.   Patient agreed to services and verbal consent obtained.   Follow up plan: Telephone appointment with care management team member scheduled for:09/02/21  [place] [facility]  Direct Dial: [phone]

## 2021-08-24 DIAGNOSIS — N179 Acute kidney failure, unspecified: Secondary | ICD-10-CM | POA: Diagnosis not present

## 2021-08-24 DIAGNOSIS — N185 Chronic kidney disease, stage 5: Secondary | ICD-10-CM | POA: Diagnosis not present

## 2021-09-01 DIAGNOSIS — R809 Proteinuria, unspecified: Secondary | ICD-10-CM | POA: Diagnosis not present

## 2021-09-01 DIAGNOSIS — I120 Hypertensive chronic kidney disease with stage 5 chronic kidney disease or end stage renal disease: Secondary | ICD-10-CM | POA: Diagnosis not present

## 2021-09-01 DIAGNOSIS — D631 Anemia in chronic kidney disease: Secondary | ICD-10-CM | POA: Diagnosis not present

## 2021-09-01 DIAGNOSIS — R918 Other nonspecific abnormal finding of lung field: Secondary | ICD-10-CM | POA: Diagnosis not present

## 2021-09-01 DIAGNOSIS — N185 Chronic kidney disease, stage 5: Secondary | ICD-10-CM | POA: Diagnosis not present

## 2021-09-01 DIAGNOSIS — E785 Hyperlipidemia, unspecified: Secondary | ICD-10-CM | POA: Diagnosis not present

## 2021-09-01 DIAGNOSIS — E1122 Type 2 diabetes mellitus with diabetic chronic kidney disease: Secondary | ICD-10-CM | POA: Diagnosis not present

## 2021-09-01 DIAGNOSIS — I5030 Unspecified diastolic (congestive) heart failure: Secondary | ICD-10-CM | POA: Diagnosis not present

## 2021-09-01 DIAGNOSIS — E876 Hypokalemia: Secondary | ICD-10-CM | POA: Diagnosis not present

## 2021-09-01 DIAGNOSIS — I351 Nonrheumatic aortic (valve) insufficiency: Secondary | ICD-10-CM | POA: Diagnosis not present

## 2021-09-01 DIAGNOSIS — I10 Essential (primary) hypertension: Secondary | ICD-10-CM | POA: Diagnosis not present

## 2021-09-01 DIAGNOSIS — I5032 Chronic diastolic (congestive) heart failure: Secondary | ICD-10-CM | POA: Diagnosis not present

## 2021-09-01 DIAGNOSIS — N2581 Secondary hyperparathyroidism of renal origin: Secondary | ICD-10-CM | POA: Diagnosis not present

## 2021-09-02 ENCOUNTER — Ambulatory Visit (INDEPENDENT_AMBULATORY_CARE_PROVIDER_SITE_OTHER): Payer: Medicare PPO

## 2021-09-02 DIAGNOSIS — E1122 Type 2 diabetes mellitus with diabetic chronic kidney disease: Secondary | ICD-10-CM

## 2021-09-02 DIAGNOSIS — I10 Essential (primary) hypertension: Secondary | ICD-10-CM

## 2021-09-02 DIAGNOSIS — N184 Chronic kidney disease, stage 4 (severe): Secondary | ICD-10-CM

## 2021-09-02 DIAGNOSIS — I5033 Acute on chronic diastolic (congestive) heart failure: Secondary | ICD-10-CM

## 2021-09-02 NOTE — Chronic Care Management (AMB) (Signed)
Chronic Care Management   CCM RN Visit Note  09/02/2021 Name: Austin [name] MRN: [id number] DOB: 1934-11-26  Subjective: Austin [name] is a 87 y.o. year old male who is a primary care patient of Dorothyann Peng, NP. The care management team was consulted for assistance with disease management and care coordination needs.    Engaged with patient by telephone for initial visit in response to provider referral for case management and/or care coordination services.   Consent to Services:  The patient was given the following information about Chronic Care Management services today, agreed to services, and gave verbal consent: 1. CCM service includes personalized support from designated clinical staff supervised by the primary care provider, including individualized plan of care and coordination with other care providers 2. 24/7 contact phone numbers for assistance for urgent and routine care needs. 3. Service will only be billed when office clinical staff spend 20 minutes or more in a month to coordinate care. 4. Only one practitioner may furnish and bill the service in a calendar month. 5.The patient may stop CCM services at any time (effective at the end of the month) by phone call to the office staff. 6. The patient will be responsible for cost sharing (co-pay) of up to 20% of the service fee (after annual deductible is met). Patient agreed to services and consent obtained.  Patient agreed to services and verbal consent obtained.   Assessment: Review of patient past medical history, allergies, medications, health status, including review of consultants reports, laboratory and other test data, was performed as part of comprehensive evaluation and provision of chronic care management services.   SDOH (Social Determinants of Health) assessments and interventions performed:  SDOH Interventions    Flowsheet Row Most Recent Value  SDOH Interventions   Food Insecurity Interventions Intervention Not Indicated   Financial Strain Interventions Intervention Not Indicated  Housing Interventions Intervention Not Indicated  Stress Interventions Intervention Not Indicated  Transportation Interventions Intervention Not Indicated        CCM Care Plan  Allergies  Allergen Reactions   Aspirin Other (See Comments)    High doses causes stomach ulcer and bleeding    Outpatient Encounter Medications as of 09/02/2021  Medication Sig Note   ACCU-CHEK GUIDE test strip USED TO CHECK BLOOD GLUCOSE TWICE A DAY OR AS NEEDED    Accu-Chek Softclix Lancets lancets USED TO CHECK BLOOD GLUCOSE TWICE A DAY OR AS NEEDED    acetaminophen (TYLENOL) 325 MG tablet Take 325 mg by mouth 4 (four) times daily.    albuterol (VENTOLIN HFA) 108 (90 Base) MCG/ACT inhaler TAKE 2 PUFFS BY MOUTH EVERY 6 HOURS AS NEEDED FOR WHEEZE OR SHORTNESS OF BREATH    amLODipine (NORVASC) 10 MG tablet TAKE 1 TABLET BY MOUTH EVERY DAY    atorvastatin (LIPITOR) 20 MG tablet TAKE 1 TABLET BY MOUTH EVERY DAY (Patient taking differently: Take 20 mg by mouth daily.)    bisacodyl (DULCOLAX) 10 MG suppository Place 1 suppository (10 mg total) rectally daily as needed for mild constipation.    Blood Glucose Monitoring Suppl (ACCU-CHEK AVIVA PLUS) w/Device KIT Used to check blood glucose 2 times a day or PRN    cholecalciferol (VITAMIN D3) 25 MCG (1000 UNIT) tablet Take 1,000 Units by mouth daily.    cloNIDine (CATAPRES) 0.3 MG tablet Take 1 tablet (0.3 mg total) by mouth 3 (three) times daily.    clopidogrel (PLAVIX) 75 MG tablet TAKE 1 TABLET BY MOUTH EVERY DAY    doxazosin (CARDURA) 2  MG tablet Take 2 mg by mouth at bedtime.     esomeprazole (NEXIUM) 40 MG capsule TAKE 1 CAPSULE BY MOUTH EVERY DAY (Patient taking differently: 80 mg daily.) 07/09/2021: Pt's md told to increase to 40m daily for 1-2 weeks then resume 476mdaily.    glipiZIDE (GLUCOTROL XL) 5 MG 24 hr tablet TAKE 1 TABLET BY MOUTH EVERY DAY    hydrocortisone (ANUSOL-HC) 2.5 % rectal cream  PLACE 1 APPLICATION RECTALLY 2 TIMES A DAY. (Patient taking differently: Place 1 application rectally 2 (two) times daily.)    isosorbide-hydrALAZINE (BIDIL) 20-37.5 MG tablet Take 1 tablet by mouth 3 (three) times daily.    KLOR-CON M10 10 MEQ tablet Take 2 tablets (20 mEq total) by mouth 2 (two) times daily. (Patient taking differently: Take 10 mEq by mouth 2 (two) times daily.)    lactulose (CHRONULAC) 10 GM/15ML solution TAKE 15 ML BY MOUTH 2 TIMES DAILY AS NEEDED FOR MILD CONSTIPATION. (Patient taking differently: Take 10 g by mouth 2 (two) times daily as needed for mild constipation.)    metoprolol succinate (TOPROL-XL) 100 MG 24 hr tablet Take 1 tablet (100 mg total) by mouth daily. Take with or immediately following a meal.    polyethylene glycol (MIRALAX / GLYCOLAX) 17 g packet Take 17 g by mouth daily as needed for mild constipation.    Propylene Glycol (SYSTANE BALANCE OP) Place 1 drop into both eyes daily as needed (dry eyes).    torsemide 40 MG TABS Take 40 mg by mouth daily.    triamcinolone cream (KENALOG) 0.1 % Apply 1 application topically daily as needed (dry/irritated skin).    Facility-Administered Encounter Medications as of 09/02/2021  Medication   ammonium lactate (LAC-HYDRIN) 12 % lotion    Patient Active Problem List   Diagnosis Date Noted   Acute on chronic diastolic CHF (congestive heart failure) ([facility]11/18/2022   Dyspnea 06/01/2021   Upper airway cough syndrome 04/06/2021   Porokeratosis 03/26/2021   Pulmonary nodule 1 cm or greater in diameter 10/22/2020   Mass of right lung 0116/05/9603 Acute diastolic CHF (congestive heart failure) ([facility]01/27/2022   Acute exacerbation of CHF (congestive heart failure) ([facility]/13/2020   CKD (chronic kidney disease), stage V ([facility]12/13/2020   Elevated troponin 08/04/2019   Hypokalemia 08/04/2019   Hypertensive urgency 03/28/2019   Elevated serum immunoglobulin free light chains 02/20/2019   Gastrointestinal hemorrhage  with melena    Melena 11/03/2016   Carotid artery stenosis 05/10/2010   Diabetes mellitus with renal complications ([facility]0154/04/8118 Iron deficiency anemia due to chronic blood loss 03/13/2007   GERD 03/13/2007   Dyslipidemia 03/05/2007   Essential hypertension 03/05/2007   DISEASE, CEREBROVASCULAR NEC 03/05/2007   PROSTATE CANCER, HX OF 03/05/2007    Conditions to be addressed/monitored:CHF, HTN, HLD, DMII, and CKD Stage 4  Care Plan : RN Care Manager Plan of Care  Updates made by SaDimitri PedRN since 09/02/2021 12:00 AM     Problem: Chronic Disease Management and Care Coordination Needs (CHF, CKD4, DM,HTN and HLD)   Priority: High     Long-Range Goal: Establish Plan of Care for Chronic Disease Management Needs (CHF, CKD4, DM,HTN and HLD)   Start Date: 09/02/2021  Expected End Date: 08/30/2022  Priority: High  Note:   Current Barriers:  Knowledge Deficits related to plan of care for management of CHF, HTN, HLD, DMII, and CKD Stage 4  Chronic Disease Management support and education needs related to CHF, HTN,  HLD, DMII, and CKD Stage 4  States he saw his cardiologist Dr. Terrence Dupont yesterday and he also saw Dr. Royce Macadamia his nephrologist yesterday.  States he has had more swelling in his legs and has some shortness of breath.  His nephrologist ordered for him to take increased torsemide for 3 days.  States he weights daily but has not weighted today.  States his CBGs range from 90-109 most mornings and rarely goes over 130.  States he sometimes will have lows in the afternoon if he forgets to eat a midday snack.  States his B/P was 120/62 yesterday at the doctors office.  States he is still getting PT from home health but his last visit will be in next week  RNCM Clinical Goal(s):  Patient will verbalize understanding of plan for management of CHF, HTN, HLD, DMII, and CKD Stage 4 as evidenced by voiced adherence to plan of care verbalize basic understanding of  CHF, HTN, HLD, DMII,  and CKD Stage 4 disease process and self health management plan as evidenced by voiced understanding and teach back take all medications exactly as prescribed and will call provider for medication related questions as evidenced by dispense report and pt verbalization attend all scheduled medical appointments: Pulmonology 09/28/21,Nephrology 10/01/21, cardiology April 2023 as evidenced by medical records demonstrate Improved adherence to prescribed treatment plan for CHF, HTN, HLD, DMII, and CKD Stage 4 as evidenced by readings within limits, voiced adherence to plan of care continue to work with RN Care Manager to address care management and care coordination needs related to  CHF, HTN, HLD, DMII, and CKD Stage 4 as evidenced by adherence to CM Team Scheduled appointments not experience hospital admission as evidenced by review of EMR. Hospital Admissions in last 6 months = 1 demonstrate a decrease in CHF, HTN, HLD, DMII, and CKD Stage 4 exacerbations as evidenced by pt reports and no hospitalizations  through collaboration with RN Care manager, provider, and care team.   Interventions: 1:1 collaboration with primary care provider regarding development and update of comprehensive plan of care as evidenced by provider attestation and co-signature Inter-disciplinary care team collaboration (see longitudinal plan of care) Evaluation of current treatment plan related to  self management and patient's adherence to plan as established by provider   Heart Failure Interventions:  (Status:  New goal.) Long Term Goal Basic overview and discussion of pathophysiology of Heart Failure reviewed Provided education on low sodium diet Reviewed Heart Failure Action Plan in depth and provided written copy Assessed need for readable accurate scales in home Discussed importance of daily weight and advised patient to weigh and record daily Reviewed role of diuretics in prevention of fluid overload and management of heart  failure; Discussed the importance of keeping all appointments with provider   Chronic Kidney Disease Interventions:  (Status:  New goal.) Long Term Goal Assessed the Patient understanding of chronic kidney disease    Evaluation of current treatment plan related to chronic kidney disease self management and patient's adherence to plan as established by provider      Provided education to patient re: stroke prevention, s/s of heart attack and stroke    Reviewed prescribed diet low sodium low CHO Advised patient, providing education and rationale, to monitor blood pressure daily and record, calling PCP for findings outside established parameters    Screening for signs and symptoms of depression related to chronic disease state      Assessed social determinant of health barriers    Last practice recorded  BP readings:  BP Readings from Last 3 Encounters:  07/21/21 140/80  07/14/21 (!) 163/58  06/29/21 124/70  Most recent eGFR/CrCl: No results found for: EGFR  No components found for: CRCL    Diabetes Interventions:  (Status:  New goal.) Long Term Goal Assessed patient's understanding of A1c goal: <7% Provided education to patient about basic DM disease process Reviewed medications with patient and discussed importance of medication adherence Discussed plans with patient for ongoing care management follow up and provided patient with direct contact information for care management team Provided patient with written educational materials related to hypo and hyperglycemia and importance of correct treatment Reviewed scheduled/upcoming provider appointments including: Pulmonology 09/28/21,Nephrology 10/01/21, cardiology April 2023 Advised patient, providing education and rationale, to check cbg daily and record, calling provider for findings outside established parameters Lab Results  Component Value Date   HGBA1C 6.3 (H) 07/09/2021   Hyperlipidemia Interventions:  (Status:  New goal.) Long Term  Goal Medication review performed; medication list updated in electronic medical record.  Provider established cholesterol goals reviewed Counseled on importance of regular laboratory monitoring as prescribed Reviewed role and benefits of statin for ASCVD risk reduction Reviewed importance of limiting foods high in cholesterol  Hypertension Interventions:  (Status:  New goal.) Long Term Goal Last practice recorded BP readings:  BP Readings from Last 3 Encounters:  07/21/21 140/80  07/14/21 (!) 163/58  06/29/21 124/70  Most recent eGFR/CrCl: No results found for: EGFR  No components found for: CRCL  Evaluation of current treatment plan related to hypertension self management and patient's adherence to plan as established by provider Provided education to patient re: stroke prevention, s/s of heart attack and stroke Discussed plans with patient for ongoing care management follow up and provided patient with direct contact information for care management team Advised patient, providing education and rationale, to monitor blood pressure daily and record, calling PCP for findings outside established parameters  Patient Goals/Self-Care Activities: Take all medications as prescribed Attend all scheduled provider appointments Call pharmacy for medication refills 3-7 days in advance of running out of medications Call provider office for new concerns or questions  call office if I gain more than 2 pounds in one day or 5 pounds in one week keep legs up while sitting track weight in diary watch for swelling in feet, ankles and legs every day weigh myself daily follow rescue plan if symptoms flare-up eat more whole grains, fruits and vegetables, lean meats and healthy fats keep appointment with eye doctor check blood sugar at prescribed times: once daily and when you have symptoms of low or high blood sugar check feet daily for cuts, sores or redness take the blood sugar log to all doctor  visits fill half of plate with vegetables manage portion size switch to sugar-free drinks keep feet up while sitting check blood pressure daily choose a place to take my blood pressure (home, clinic or office, retail store) take blood pressure log to all doctor appointments call doctor for signs and symptoms of high blood pressure eat more whole grains, fruits and vegetables, lean meats and healthy fats limit salt intake to 2085m/day call for medicine refill 2 or 3 days before it runs out take all medications exactly as prescribed call doctor with any symptoms you believe are related to your medicine  Follow Up Plan:  Telephone follow up appointment with care management team member scheduled for:  10/04/21 The patient has been provided with contact information for the care management team and  has been advised to call with any health related questions or concerns.       Plan:Telephone follow up appointment with care management team member scheduled for:  10/04/21 The patient has been provided with contact information for the care management team and has been advised to call with any health related questions or concerns.  Peter Garter RN, Jackquline Denmark, CDE Care Management Coordinator [facility] [phone], Mobile [phone]

## 2021-09-02 NOTE — Patient Instructions (Signed)
Visit Information   Thank you for taking time to visit with me today. Please don't hesitate to contact me if I can be of assistance to you before our next scheduled telephone appointment.  Following are the goals we discussed today:  Take all medications as prescribed Attend all scheduled provider appointments Call pharmacy for medication refills 3-7 days in advance of running out of medications Call provider office for new concerns or questions  call office if I gain more than 2 pounds in one day or 5 pounds in one week keep legs up while sitting track weight in diary watch for swelling in feet, ankles and legs every day weigh myself daily follow rescue plan if symptoms flare-up eat more whole grains, fruits and vegetables, lean meats and healthy fats keep appointment with eye doctor check blood sugar at prescribed times: once daily and when you have symptoms of low or high blood sugar check feet daily for cuts, sores or redness take the blood sugar log to all doctor visits fill half of plate with vegetables manage portion size switch to sugar-free drinks keep feet up while sitting check blood pressure daily choose a place to take my blood pressure (home, clinic or office, retail store) take blood pressure log to all doctor appointments call doctor for signs and symptoms of high blood pressure eat more whole grains, fruits and vegetables, lean meats and healthy fats limit salt intake to 2023m/day call for medicine refill 2 or 3 days before it runs out take all medications exactly as prescribed call doctor with any symptoms you believe are related to your medicine Heart Failure Action Plan A heart failure action plan helps you understand what to do when you have symptoms of heart failure. Your action plan is a color-coded plan that lists the symptoms to watch for and indicates what actions to take. If you have symptoms in the red zone, you need medical care right away. If you  have symptoms in the yellow zone, you are having problems. If you have symptoms in the green zone, you are doing well. Follow the plan that was created by you and your health care provider. Review your plan each time you visit your health care provider. Red zone These signs and symptoms mean you should get medical help right away: You have trouble breathing when resting. You have a dry cough that is getting worse. You have swelling or pain in your legs or abdomen that is getting worse. You suddenly gain more than 2-3 lb (0.9-1.4 kg) in 24 hours, or more than 5 lb (2.3 kg) in a week. This amount may be more or less depending on your condition. You have trouble staying awake or you feel confused. You have chest pain. You do not have an appetite. You pass out. You have worsening sadness or depression. If you have any of these symptoms, call your local emergency services (911 in the [place] right away. Do not drive yourself to the hospital. Yellow zone These signs and symptoms mean your condition may be getting worse and you should make some changes: You have trouble breathing when you are active, or you need to sleep with your head raised on extra pillows to help you breathe. You have swelling in your legs or abdomen. You gain 2-3 lb (0.9-1.4 kg) in 24 hours, or 5 lb (2.3 kg) in a week. This amount may be more or less depending on your condition. You get tired easily. You have trouble sleeping. You have a  dry cough. If you have any of these symptoms: Contact your health care provider within the next day. Your health care provider may adjust your medicines. Green zone These signs mean you are doing well and can continue what you are doing: You do not have shortness of breath. You have very little swelling or no new swelling. Your weight is stable (no gain or loss). You have a normal activity level. You do not have chest pain or any other new symptoms. Follow these instructions at  home: Take over-the-counter and prescription medicines only as told by your health care provider. Weigh yourself daily. Your target weight is __________ lb (__________ kg). Call your health care provider if you gain more than __________ lb (__________ kg) in 24 hours, or more than __________ lb (__________ kg) in a week. Health care provider name: _____________________________________________________ Health care provider phone number: _____________________________________________________ Eat a heart-healthy diet. Work with a diet and nutrition specialist (dietitian) to create an eating plan that is best for you. Keep all follow-up visits. This is important. Where to find more information American Heart Association: www.heart.org Summary A heart failure action plan helps you understand what to do when you have symptoms of heart failure. Follow the action plan that was created by you and your health care provider. Get help right away if you have any symptoms in the red zone. This information is not intended to replace advice given to you by your health care provider. Make sure you discuss any questions you have with your health care provider. Document Revised: 03/23/2020 Document Reviewed: 03/23/2020 Elsevier Patient Education  2022 [place] next appointment is by telephone on 10/04/21 at 11:30 AM  Please call the care guide team at [phone] if you need to cancel or reschedule your appointment.   If you are experiencing a Mental Health or [place] or need someone to talk to, please call the Suicide and Crisis Lifeline: 988 call the [place] National Suicide Prevention Lifeline: (418)514-4711 or TTY: 818-875-6647 TTY 734-624-3073) to talk to a trained counselor call 1-800-273-TALK (toll free, 24 hour hotline) go to [facility] [address] [place] [phone]) call 911   Following is a copy of your full care plan:  Care  Plan : RN Care Manager Plan of Care  Updates made by Dimitri Ped, RN since 09/02/2021 12:00 AM     Problem: Chronic Disease Management and Care Coordination Needs (CHF, CKD4, DM,HTN and HLD)   Priority: High     Long-Range Goal: Establish Plan of Care for Chronic Disease Management Needs (CHF, CKD4, DM,HTN and HLD)   Start Date: 09/02/2021  Expected End Date: 08/30/2022  Priority: High  Note:   Current Barriers:  Knowledge Deficits related to plan of care for management of CHF, HTN, HLD, DMII, and CKD Stage 4  Chronic Disease Management support and education needs related to CHF, HTN, HLD, DMII, and CKD Stage 4  States he saw his cardiologist Dr. Terrence Dupont yesterday and he also saw Dr. Royce Macadamia his nephrologist yesterday.  States he has had more swelling in his legs and has some shortness of breath.  His nephrologist ordered for him to take increased torsemide for 3 days.  States he weights daily but has not weighted today.  States his CBGs range from 90-109 most mornings and rarely goes over 130.  States he sometimes will have lows in the afternoon if he forgets to eat a midday snack.  States his B/P was  120/62 yesterday at the doctors office.  States he is still getting PT from home health but his last visit will be in next week  RNCM Clinical Goal(s):  Patient will verbalize understanding of plan for management of CHF, HTN, HLD, DMII, and CKD Stage 4 as evidenced by voiced adherence to plan of care verbalize basic understanding of  CHF, HTN, HLD, DMII, and CKD Stage 4 disease process and self health management plan as evidenced by voiced understanding and teach back take all medications exactly as prescribed and will call provider for medication related questions as evidenced by dispense report and pt verbalization attend all scheduled medical appointments: Pulmonology 09/28/21,Nephrology 10/01/21, cardiology April 2023 as evidenced by medical records demonstrate Improved adherence to  prescribed treatment plan for CHF, HTN, HLD, DMII, and CKD Stage 4 as evidenced by readings within limits, voiced adherence to plan of care continue to work with RN Care Manager to address care management and care coordination needs related to  CHF, HTN, HLD, DMII, and CKD Stage 4 as evidenced by adherence to CM Team Scheduled appointments not experience hospital admission as evidenced by review of EMR. Hospital Admissions in last 6 months = 1 demonstrate a decrease in CHF, HTN, HLD, DMII, and CKD Stage 4 exacerbations as evidenced by pt reports and no hospitalizations  through collaboration with RN Care manager, provider, and care team.   Interventions: 1:1 collaboration with primary care provider regarding development and update of comprehensive plan of care as evidenced by provider attestation and co-signature Inter-disciplinary care team collaboration (see longitudinal plan of care) Evaluation of current treatment plan related to  self management and patient's adherence to plan as established by provider   Heart Failure Interventions:  (Status:  New goal.) Long Term Goal Basic overview and discussion of pathophysiology of Heart Failure reviewed Provided education on low sodium diet Reviewed Heart Failure Action Plan in depth and provided written copy Assessed need for readable accurate scales in home Discussed importance of daily weight and advised patient to weigh and record daily Reviewed role of diuretics in prevention of fluid overload and management of heart failure; Discussed the importance of keeping all appointments with provider   Chronic Kidney Disease Interventions:  (Status:  New goal.) Long Term Goal Assessed the Patient understanding of chronic kidney disease    Evaluation of current treatment plan related to chronic kidney disease self management and patient's adherence to plan as established by provider      Provided education to patient re: stroke prevention, s/s of heart  attack and stroke    Reviewed prescribed diet low sodium low CHO Advised patient, providing education and rationale, to monitor blood pressure daily and record, calling PCP for findings outside established parameters    Screening for signs and symptoms of depression related to chronic disease state      Assessed social determinant of health barriers    Last practice recorded BP readings:  BP Readings from Last 3 Encounters:  07/21/21 140/80  07/14/21 (!) 163/58  06/29/21 124/70  Most recent eGFR/CrCl: No results found for: EGFR  No components found for: CRCL    Diabetes Interventions:  (Status:  New goal.) Long Term Goal Assessed patient's understanding of A1c goal: <7% Provided education to patient about basic DM disease process Reviewed medications with patient and discussed importance of medication adherence Discussed plans with patient for ongoing care management follow up and provided patient with direct contact information for care management team Provided patient with written educational materials  related to hypo and hyperglycemia and importance of correct treatment Reviewed scheduled/upcoming provider appointments including: Pulmonology 09/28/21,Nephrology 10/01/21, cardiology April 2023 Advised patient, providing education and rationale, to check cbg daily and record, calling provider for findings outside established parameters Lab Results  Component Value Date   HGBA1C 6.3 (H) 07/09/2021   Hyperlipidemia Interventions:  (Status:  New goal.) Long Term Goal Medication review performed; medication list updated in electronic medical record.  Provider established cholesterol goals reviewed Counseled on importance of regular laboratory monitoring as prescribed Reviewed role and benefits of statin for ASCVD risk reduction Reviewed importance of limiting foods high in cholesterol  Hypertension Interventions:  (Status:  New goal.) Long Term Goal Last practice recorded BP readings:   BP Readings from Last 3 Encounters:  07/21/21 140/80  07/14/21 (!) 163/58  06/29/21 124/70  Most recent eGFR/CrCl: No results found for: EGFR  No components found for: CRCL  Evaluation of current treatment plan related to hypertension self management and patient's adherence to plan as established by provider Provided education to patient re: stroke prevention, s/s of heart attack and stroke Discussed plans with patient for ongoing care management follow up and provided patient with direct contact information for care management team Advised patient, providing education and rationale, to monitor blood pressure daily and record, calling PCP for findings outside established parameters  Patient Goals/Self-Care Activities: Take all medications as prescribed Attend all scheduled provider appointments Call pharmacy for medication refills 3-7 days in advance of running out of medications Call provider office for new concerns or questions  call office if I gain more than 2 pounds in one day or 5 pounds in one week keep legs up while sitting track weight in diary watch for swelling in feet, ankles and legs every day weigh myself daily follow rescue plan if symptoms flare-up eat more whole grains, fruits and vegetables, lean meats and healthy fats keep appointment with eye doctor check blood sugar at prescribed times: once daily and when you have symptoms of low or high blood sugar check feet daily for cuts, sores or redness take the blood sugar log to all doctor visits fill half of plate with vegetables manage portion size switch to sugar-free drinks keep feet up while sitting check blood pressure daily choose a place to take my blood pressure (home, clinic or office, retail store) take blood pressure log to all doctor appointments call doctor for signs and symptoms of high blood pressure eat more whole grains, fruits and vegetables, lean meats and healthy fats limit salt intake to  2081m/day call for medicine refill 2 or 3 days before it runs out take all medications exactly as prescribed call doctor with any symptoms you believe are related to your medicine  Follow Up Plan:  Telephone follow up appointment with care management team member scheduled for:  10/04/21 The patient has been provided with contact information for the care management team and has been advised to call with any health related questions or concerns.       Consent to CCM Services: Mr. CWierzbawas given information about Chronic Care Management services including:  CCM service includes personalized support from designated clinical staff supervised by his physician, including individualized plan of care and coordination with other care providers 24/7 contact phone numbers for assistance for urgent and routine care needs. Service will only be billed when office clinical staff spend 20 minutes or more in a month to coordinate care. Only one practitioner may furnish and bill the service in  a calendar month. The patient may stop CCM services at any time (effective at the end of the month) by phone call to the office staff. The patient will be responsible for cost sharing (co-pay) of up to 20% of the service fee (after annual deductible is met).  Patient agreed to services and verbal consent obtained.   The patient verbalized understanding of instructions, educational materials, and care plan provided today and agreed to receive a mailed copy of patient instructions, educational materials, and care plan.   Telephone follow up appointment with care management team member scheduled for: 10/04/21 at 11:30 AM Peter Garter RN, Jackquline Denmark, CDE Care Management Coordinator [facility] [phone], Mobile [phone]

## 2021-09-03 ENCOUNTER — Ambulatory Visit: Payer: Medicare PPO | Admitting: Podiatry

## 2021-09-03 ENCOUNTER — Encounter: Payer: Self-pay | Admitting: Podiatry

## 2021-09-03 ENCOUNTER — Other Ambulatory Visit: Payer: Self-pay

## 2021-09-03 DIAGNOSIS — M79675 Pain in left toe(s): Secondary | ICD-10-CM

## 2021-09-03 DIAGNOSIS — E0821 Diabetes mellitus due to underlying condition with diabetic nephropathy: Secondary | ICD-10-CM | POA: Diagnosis not present

## 2021-09-03 DIAGNOSIS — M79674 Pain in right toe(s): Secondary | ICD-10-CM

## 2021-09-03 DIAGNOSIS — B351 Tinea unguium: Secondary | ICD-10-CM | POA: Diagnosis not present

## 2021-09-03 DIAGNOSIS — Q828 Other specified congenital malformations of skin: Secondary | ICD-10-CM

## 2021-09-03 NOTE — Progress Notes (Signed)
This patient returns to my office for at risk foot care.  This patient requires this care by a professional since this patient will be at risk due to having diabetic neuropathy and CKD   This patient is unable to cut nails himself since the patient cannot reach his nails.These nails are painful walking and wearing shoes.  This patient presents for at risk foot care today.  General Appearance  Alert, conversant and in no acute stress.  Vascular  Dorsalis pedis and posterior tibial  pulses are weakly  palpable due to swelling. bilaterally.  Capillary return is within normal limits  bilaterally. Temperature is within normal limits  bilaterally.  Neurologic  Senn-Weinstein monofilament wire test diminished bilaterally. Muscle power within normal limits bilaterally.  Nails Thick disfigured discolored nails with subungual debris  from hallux to fifth toes bilaterally. No evidence of bacterial infection or drainage bilaterally.  Orthopedic  No limitations of motion  feet .  No crepitus or effusions noted.  No bony pathology or digital deformities noted.  HAV  B/L  Hammer toes  B/L.  Skin  normotropic skin with noted bilaterally.  No signs of infections or ulcers noted.   Porokeratosis sub 3,4 left foot. Porokeratosis medial aspect 1st MPJ B/L.  Porokeratosis left forefoot.  Consent was obtained for treatment procedures.   Debride porokeratosis with # 15 blade.  Nails were done as a courtesy.   Return office visit    9 weeks                Told patient to return for periodic foot care and evaluation due to potential at risk complications.   Gardiner Barefoot DPM

## 2021-09-16 ENCOUNTER — Other Ambulatory Visit: Payer: Self-pay | Admitting: Adult Health

## 2021-09-16 DIAGNOSIS — H43813 Vitreous degeneration, bilateral: Secondary | ICD-10-CM | POA: Diagnosis not present

## 2021-09-16 DIAGNOSIS — H353231 Exudative age-related macular degeneration, bilateral, with active choroidal neovascularization: Secondary | ICD-10-CM | POA: Diagnosis not present

## 2021-09-16 DIAGNOSIS — E113291 Type 2 diabetes mellitus with mild nonproliferative diabetic retinopathy without macular edema, right eye: Secondary | ICD-10-CM | POA: Diagnosis not present

## 2021-09-16 DIAGNOSIS — E113212 Type 2 diabetes mellitus with mild nonproliferative diabetic retinopathy with macular edema, left eye: Secondary | ICD-10-CM | POA: Diagnosis not present

## 2021-09-21 DIAGNOSIS — I10 Essential (primary) hypertension: Secondary | ICD-10-CM

## 2021-09-21 DIAGNOSIS — N184 Chronic kidney disease, stage 4 (severe): Secondary | ICD-10-CM | POA: Diagnosis not present

## 2021-09-21 DIAGNOSIS — E1122 Type 2 diabetes mellitus with diabetic chronic kidney disease: Secondary | ICD-10-CM | POA: Diagnosis not present

## 2021-09-21 DIAGNOSIS — I5033 Acute on chronic diastolic (congestive) heart failure: Secondary | ICD-10-CM | POA: Diagnosis not present

## 2021-09-26 ENCOUNTER — Encounter (HOSPITAL_COMMUNITY): Payer: Self-pay | Admitting: Emergency Medicine

## 2021-09-26 ENCOUNTER — Emergency Department (HOSPITAL_COMMUNITY): Payer: Medicare PPO

## 2021-09-26 ENCOUNTER — Inpatient Hospital Stay (HOSPITAL_COMMUNITY)
Admission: EM | Admit: 2021-09-26 | Discharge: 2021-09-30 | DRG: 291 | Disposition: A | Discharge status: Home Health | Payer: Medicare PPO | Attending: Internal Medicine | Admitting: Internal Medicine

## 2021-09-26 ENCOUNTER — Other Ambulatory Visit: Payer: Self-pay

## 2021-09-26 DIAGNOSIS — J9601 Acute respiratory failure with hypoxia: Secondary | ICD-10-CM | POA: Diagnosis not present

## 2021-09-26 DIAGNOSIS — Z833 Family history of diabetes mellitus: Secondary | ICD-10-CM

## 2021-09-26 DIAGNOSIS — E876 Hypokalemia: Secondary | ICD-10-CM | POA: Diagnosis not present

## 2021-09-26 DIAGNOSIS — I083 Combined rheumatic disorders of mitral, aortic and tricuspid valves: Secondary | ICD-10-CM | POA: Diagnosis present

## 2021-09-26 DIAGNOSIS — I3139 Other pericardial effusion (noninflammatory): Secondary | ICD-10-CM | POA: Diagnosis present

## 2021-09-26 DIAGNOSIS — Z886 Allergy status to analgesic agent status: Secondary | ICD-10-CM

## 2021-09-26 DIAGNOSIS — Z87891 Personal history of nicotine dependence: Secondary | ICD-10-CM | POA: Diagnosis not present

## 2021-09-26 DIAGNOSIS — I440 Atrioventricular block, first degree: Secondary | ICD-10-CM | POA: Diagnosis present

## 2021-09-26 DIAGNOSIS — R0602 Shortness of breath: Secondary | ICD-10-CM

## 2021-09-26 DIAGNOSIS — N250 Renal osteodystrophy: Secondary | ICD-10-CM | POA: Diagnosis not present

## 2021-09-26 DIAGNOSIS — I509 Heart failure, unspecified: Secondary | ICD-10-CM | POA: Insufficient documentation

## 2021-09-26 DIAGNOSIS — R911 Solitary pulmonary nodule: Secondary | ICD-10-CM | POA: Diagnosis present

## 2021-09-26 DIAGNOSIS — I132 Hypertensive heart and chronic kidney disease with heart failure and with stage 5 chronic kidney disease, or end stage renal disease: Principal | ICD-10-CM | POA: Diagnosis present

## 2021-09-26 DIAGNOSIS — N179 Acute kidney failure, unspecified: Secondary | ICD-10-CM | POA: Diagnosis not present

## 2021-09-26 DIAGNOSIS — G4733 Obstructive sleep apnea (adult) (pediatric): Secondary | ICD-10-CM | POA: Diagnosis present

## 2021-09-26 DIAGNOSIS — I5033 Acute on chronic diastolic (congestive) heart failure: Secondary | ICD-10-CM | POA: Diagnosis present

## 2021-09-26 DIAGNOSIS — I517 Cardiomegaly: Secondary | ICD-10-CM | POA: Diagnosis not present

## 2021-09-26 DIAGNOSIS — N185 Chronic kidney disease, stage 5: Secondary | ICD-10-CM | POA: Diagnosis present

## 2021-09-26 DIAGNOSIS — D500 Iron deficiency anemia secondary to blood loss (chronic): Secondary | ICD-10-CM | POA: Diagnosis not present

## 2021-09-26 DIAGNOSIS — Z8249 Family history of ischemic heart disease and other diseases of the circulatory system: Secondary | ICD-10-CM

## 2021-09-26 DIAGNOSIS — E785 Hyperlipidemia, unspecified: Secondary | ICD-10-CM | POA: Diagnosis not present

## 2021-09-26 DIAGNOSIS — M898X9 Other specified disorders of bone, unspecified site: Secondary | ICD-10-CM | POA: Diagnosis present

## 2021-09-26 DIAGNOSIS — I5043 Acute on chronic combined systolic (congestive) and diastolic (congestive) heart failure: Secondary | ICD-10-CM | POA: Diagnosis present

## 2021-09-26 DIAGNOSIS — K219 Gastro-esophageal reflux disease without esophagitis: Secondary | ICD-10-CM | POA: Diagnosis present

## 2021-09-26 DIAGNOSIS — N189 Chronic kidney disease, unspecified: Secondary | ICD-10-CM | POA: Diagnosis present

## 2021-09-26 DIAGNOSIS — R918 Other nonspecific abnormal finding of lung field: Secondary | ICD-10-CM | POA: Diagnosis present

## 2021-09-26 DIAGNOSIS — Z800 Family history of malignant neoplasm of digestive organs: Secondary | ICD-10-CM

## 2021-09-26 DIAGNOSIS — I10 Essential (primary) hypertension: Secondary | ICD-10-CM | POA: Diagnosis not present

## 2021-09-26 DIAGNOSIS — Z20822 Contact with and (suspected) exposure to covid-19: Secondary | ICD-10-CM | POA: Diagnosis present

## 2021-09-26 DIAGNOSIS — E1122 Type 2 diabetes mellitus with diabetic chronic kidney disease: Secondary | ICD-10-CM | POA: Diagnosis not present

## 2021-09-26 DIAGNOSIS — N390 Urinary tract infection, site not specified: Secondary | ICD-10-CM | POA: Diagnosis present

## 2021-09-26 DIAGNOSIS — E1169 Type 2 diabetes mellitus with other specified complication: Secondary | ICD-10-CM | POA: Diagnosis present

## 2021-09-26 DIAGNOSIS — Z8673 Personal history of transient ischemic attack (TIA), and cerebral infarction without residual deficits: Secondary | ICD-10-CM | POA: Diagnosis not present

## 2021-09-26 DIAGNOSIS — D631 Anemia in chronic kidney disease: Secondary | ICD-10-CM | POA: Diagnosis present

## 2021-09-26 DIAGNOSIS — E1129 Type 2 diabetes mellitus with other diabetic kidney complication: Secondary | ICD-10-CM | POA: Diagnosis present

## 2021-09-26 DIAGNOSIS — Z9049 Acquired absence of other specified parts of digestive tract: Secondary | ICD-10-CM

## 2021-09-26 DIAGNOSIS — Z79899 Other long term (current) drug therapy: Secondary | ICD-10-CM

## 2021-09-26 DIAGNOSIS — K5900 Constipation, unspecified: Secondary | ICD-10-CM | POA: Diagnosis present

## 2021-09-26 DIAGNOSIS — Z7984 Long term (current) use of oral hypoglycemic drugs: Secondary | ICD-10-CM

## 2021-09-26 DIAGNOSIS — I110 Hypertensive heart disease with heart failure: Secondary | ICD-10-CM | POA: Diagnosis not present

## 2021-09-26 DIAGNOSIS — I5031 Acute diastolic (congestive) heart failure: Secondary | ICD-10-CM | POA: Diagnosis not present

## 2021-09-26 DIAGNOSIS — Z8546 Personal history of malignant neoplasm of prostate: Secondary | ICD-10-CM | POA: Diagnosis not present

## 2021-09-26 DIAGNOSIS — R059 Cough, unspecified: Secondary | ICD-10-CM | POA: Diagnosis not present

## 2021-09-26 DIAGNOSIS — E870 Hyperosmolality and hypernatremia: Secondary | ICD-10-CM | POA: Diagnosis not present

## 2021-09-26 DIAGNOSIS — Z7902 Long term (current) use of antithrombotics/antiplatelets: Secondary | ICD-10-CM

## 2021-09-26 LAB — BASIC METABOLIC PANEL
Anion gap: 13 (ref 5–15)
BUN: 66 mg/dL — ABNORMAL HIGH (ref 8–23)
CO2: 18 mmol/L — ABNORMAL LOW (ref 22–32)
Calcium: 9.1 mg/dL (ref 8.9–10.3)
Chloride: 113 mmol/L — ABNORMAL HIGH (ref 98–111)
Creatinine, Ser: 4.33 mg/dL — ABNORMAL HIGH (ref 0.61–1.24)
GFR, Estimated: 13 mL/min — ABNORMAL LOW (ref >60)
Glucose, Bld: 181 mg/dL — ABNORMAL HIGH (ref 70–99)
Potassium: 3.7 mmol/L (ref 3.5–5.1)
Sodium: 144 mmol/L (ref 135–145)

## 2021-09-26 LAB — BRAIN NATRIURETIC PEPTIDE: B Natriuretic Peptide: 3670.5 pg/mL — ABNORMAL HIGH (ref 0.0–100.0)

## 2021-09-26 LAB — CBC
HCT: 31.6 % — ABNORMAL LOW (ref 39.0–52.0)
Hemoglobin: 9.9 g/dL — ABNORMAL LOW (ref 13.0–17.0)
MCH: 27 pg (ref 26.0–34.0)
MCHC: 31.3 g/dL (ref 30.0–36.0)
MCV: 86.1 fL (ref 80.0–100.0)
Platelets: 280 10*3/uL (ref 150–400)
RBC: 3.67 MIL/uL — ABNORMAL LOW (ref 4.22–5.81)
RDW: 17 % — ABNORMAL HIGH (ref 11.5–15.5)
WBC: 10.7 10*3/uL — ABNORMAL HIGH (ref 4.0–10.5)
nRBC: 0 % (ref 0.0–0.2)

## 2021-09-26 LAB — MAGNESIUM: Magnesium: 2.3 mg/dL (ref 1.7–2.4)

## 2021-09-26 MED ORDER — FUROSEMIDE 10 MG/ML IJ SOLN
60.0000 mg | Freq: Once | INTRAMUSCULAR | Status: AC
  Administered 2021-09-27: 60 mg via INTRAVENOUS
  Filled 2021-09-26: qty 6

## 2021-09-26 MED ORDER — POTASSIUM CHLORIDE CRYS ER 20 MEQ PO TBCR
20.0000 meq | EXTENDED_RELEASE_TABLET | Freq: Once | ORAL | Status: AC
  Administered 2021-09-26: 20 meq via ORAL
  Filled 2021-09-26: qty 1

## 2021-09-26 MED ORDER — INSULIN ASPART 100 UNIT/ML IJ SOLN
0.0000 [IU] | INTRAMUSCULAR | Status: DC
  Administered 2021-09-27 – 2021-09-28 (×2): 2 [IU] via SUBCUTANEOUS
  Administered 2021-09-28: 1 [IU] via SUBCUTANEOUS
  Administered 2021-09-28: 3 [IU] via SUBCUTANEOUS
  Administered 2021-09-29: 1 [IU] via SUBCUTANEOUS
  Administered 2021-09-29: 3 [IU] via SUBCUTANEOUS
  Administered 2021-09-29: 2 [IU] via SUBCUTANEOUS
  Administered 2021-09-30 (×2): 1 [IU] via SUBCUTANEOUS

## 2021-09-26 MED ORDER — SODIUM BICARBONATE 650 MG PO TABS
1300.0000 mg | ORAL_TABLET | Freq: Three times a day (TID) | ORAL | Status: DC
  Administered 2021-09-27: 1300 mg via ORAL
  Filled 2021-09-26: qty 2

## 2021-09-26 MED ORDER — FUROSEMIDE 10 MG/ML IJ SOLN
20.0000 mg | Freq: Once | INTRAMUSCULAR | Status: AC
  Administered 2021-09-26: 20 mg via INTRAVENOUS
  Filled 2021-09-26: qty 2

## 2021-09-26 MED ORDER — FUROSEMIDE 10 MG/ML IJ SOLN: 80.0000 mg | Freq: Two times a day (BID) | INTRAMUSCULAR | Status: DC

## 2021-09-26 NOTE — Assessment & Plan Note (Signed)
Chronic. 

## 2021-09-26 NOTE — Assessment & Plan Note (Signed)
-  chronic avoid nephrotoxic medications such as NSAIDs, Vanco Zosyn combo,  avoid hypotension, continue to follow renal function  

## 2021-09-26 NOTE — H&P (Signed)
Austin [name] ZWC:[id number] DOB: 03-Feb-1935 DOA: 09/26/2021     PCP: Dorothyann Peng, NP   Outpatient Specialists:   CARDS:  Dr.Harwani NEphrology:   Dr. Royce Macadamia   Pulmonary   Dr.Byrum    GI Dr.  Chryl Heck) Austin Banister, MD    Patient arrived to ER on 09/26/21 at 2034 Referred by Attending Toy Baker, MD   Patient coming from:    home Lives With family    Chief Complaint:   Chief Complaint  Patient presents with   Shortness of Breath    HPI: Austin [name] is a 87 y.o. male with medical history significant of diastolic CHF, DM2 HTN, HLD     Presented with shortness of breath and edema Presents with worsening shortness of breath bilateral leg swelling for the past 2 days he has been taking his Lasix as prescribed he has known history of CHF. He is shortness of breath has progressively have gotten worse in the last night he had more severe presentation.  Seems to be better when he sits up is really bad when he lays back down.  Similar presentation was 2 months ago when he got admitted.  No associated chest pain fevers no abdominal pain.  No fever no chills no CP Some pressure with urination   Initial COVID TEST  in house  PCR testing  Pending  Lab Results  Component Value Date   SARSCOV2NAA NEGATIVE 07/08/2021   [place] NEGATIVE 10/09/2020   [place] NEGATIVE 09/16/2020   [place] NEGATIVE 02/23/2020     Regarding pertinent Chronic problems:     Hyperlipidemia -  on statins lipitor Lipid Panel     Component Value Date/Time   CHOL 124 05/01/2019 0953   TRIG 90.0 05/01/2019 0953   TRIG 133 06/19/2006 0948   HDL 42.70 05/01/2019 0953   CHOLHDL 3 05/01/2019 0953   VLDL 18.0 05/01/2019 0953   LDLCALC 63 05/01/2019 0953     HTN on Norvasc, Bidil, torsemide   chronic CHF diastolic - last echo November 2022 EF 23-53% grade 1 diastolic CHF      DM 2 -  Lab Results  Component Value Date   HGBA1C 6.3 (H) 07/09/2021   diet controlled      OSA  - not diagnosed but supposed to undergo eval   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  Lab Results  Component Value Date   CREATININE 4.33 (H) 09/26/2021   CREATININE 5.06 (HH) 07/21/2021   CREATININE 4.77 (H) 07/14/2021       Chronic anemia - baseline hg Hemoglobin & Hematocrit  Recent Labs    07/12/21 0230 07/13/21 0312 09/26/21 2117  HGB 7.1* 7.1* 9.9*     While in ER:   Hypoxic down to low 80% needing up to 6L  Placed on BIPAP     CXR - early interstitial edema. Cardiomegaly    Following Medications were ordered in ER: Medications  furosemide (LASIX) injection 20 mg (20 mg Intravenous Given 09/26/21 2130)  potassium chloride SA (KLOR-CON M) CR tablet 20 mEq (20 mEq Oral Given 09/26/21 2133)        ED Triage Vitals  Enc Vitals Group     BP 09/26/21 2038 (!) 222/83     Pulse Rate 09/26/21  2038 93     Resp 09/26/21 2038 (!) 22     Temp 09/26/21 2038 98.9 F (37.2 C)     Temp Source 09/26/21 2038 Oral     SpO2 09/26/21 2038 91 %     Weight 09/26/21 2055 179 lb (81.2 kg)     Height 09/26/21 2055 '5\' 7"'  (1.702 m)     Head Circumference --      Peak Flow --      Pain Score 09/26/21 2043 0     Pain Loc --      Pain Edu? --      Excl. in [place]? --   TMAX(24)@     _________________________________________ Significant initial  Findings: Abnormal Labs Reviewed  BASIC METABOLIC PANEL - Abnormal; Notable for the following components:      Result Value   Chloride 113 (*)    CO2 18 (*)    Glucose, Bld 181 (*)    BUN 66 (*)    Creatinine, Ser 4.33 (*)    GFR, Estimated 13 (*)    All other components within normal limits  BRAIN NATRIURETIC PEPTIDE - Abnormal; Notable for the following components:   B Natriuretic Peptide 3,670.5 (*)    All other components within normal limits  CBC - Abnormal; Notable  for the following components:   WBC 10.7 (*)    RBC 3.67 (*)    Hemoglobin 9.9 (*)    HCT 31.6 (*)    RDW 17.0 (*)    All other components within normal limits    _________________________ Troponin  ordered ECG: Ordered Personally reviewed by me showing: HR : 86 Rhythm:  NSR,    nonspecific changes,  QTC 452    The recent clinical data is shown below. Vitals:   09/26/21 2100 09/26/21 2115 09/26/21 2130 09/26/21 2149  BP: (!) 116/96 (!) 134/97 118/82 (!) 124/95  Pulse: 87 86 91   Resp: (!) 27 (!) 21 (!) 24   Temp:      TempSrc:      SpO2: 93% 93% 98%   Weight:      Height:        WBC     Component Value Date/Time   WBC 10.7 (H) 09/26/2021 2117   LYMPHSABS 0.7 07/08/2021 2358   MONOABS 0.6 07/08/2021 2358   EOSABS 0.1 07/08/2021 2358   BASOSABS 0.1 07/08/2021 2358        UA   evidence of UTI      Urine analysis:    Component Value Date/Time   COLORURINE YELLOW 09/26/2021 2147   APPEARANCEUR CLOUDY (A) 09/26/2021 2147   LABSPEC 1.020 09/26/2021 2147   PHURINE 6.0 09/26/2021 2147   GLUCOSEU NEGATIVE 09/26/2021 2147   HGBUR SMALL (A) 09/26/2021 2147   HGBUR negative 09/19/2007 0000   BILIRUBINUR NEGATIVE 09/26/2021 2147   BILIRUBINUR n 11/18/2014 1003   KETONESUR NEGATIVE 09/26/2021 2147   PROTEINUR >300 (A) 09/26/2021 2147   UROBILINOGEN 0.2 11/18/2014 1003   UROBILINOGEN 1.0 01/26/2013 2059   NITRITE POSITIVE (A) 09/26/2021 2147   LEUKOCYTESUR NEGATIVE 09/26/2021 2147    Respiratory (~20 pathogens) panel by PCR     Status: None   Collection Time: 07/10/21 10:20 AM   Specimen: Nasopharyngeal Swab; Respiratory  Result Value Ref Range Status   Adenovirus NOT DETECTED NOT DETECTED Final   Coronavirus 229E NOT DETECTED NOT DETECTED Final    Comment: (NOTE) The Coronavirus on the Respiratory Panel, DOES NOT test for the novel  Coronavirus (2019 nCoV)    Coronavirus HKU1 NOT DETECTED NOT DETECTED Final   Coronavirus NL63 NOT DETECTED NOT DETECTED Final    Coronavirus OC43 NOT DETECTED NOT DETECTED Final   Metapneumovirus NOT DETECTED NOT DETECTED Final   Rhinovirus / Enterovirus NOT DETECTED NOT DETECTED Final   Influenza A NOT DETECTED NOT DETECTED Final   Influenza B NOT DETECTED NOT DETECTED Final   Parainfluenza Virus 1 NOT DETECTED NOT DETECTED Final   Parainfluenza Virus 2 NOT DETECTED NOT DETECTED Final   Parainfluenza Virus 3 NOT DETECTED NOT DETECTED Final   Parainfluenza Virus 4 NOT DETECTED NOT DETECTED Final   Respiratory Syncytial Virus NOT DETECTED NOT DETECTED Final   Bordetella pertussis NOT DETECTED NOT DETECTED Final   Bordetella Parapertussis NOT DETECTED NOT DETECTED Final   Chlamydophila pneumoniae NOT DETECTED NOT DETECTED Final   Mycoplasma pneumoniae NOT DETECTED NOT DETECTED Final    Comment: Performed at [facility], [place] [address], [place], [place] [address]     _______________________________________________ Hospitalist was called for admission for CHF exacerbation  The following Work up has been ordered so far:  Orders Placed This Encounter  Procedures   Critical Care   DG Chest Portable 1 View   Basic metabolic panel   Magnesium   Brain natriuretic peptide (order ONLY if patient c/o SOB)   CBC   Urinalysis, Routine w reflex microscopic   Cardiac monitoring   Height and weight   Strict intake and output   Initiate Carrier Fluid Protocol   If O2 sat If O2 sat < 94% administer O2 at 2 liters/minute via nasal canula   Cardiac monitoring   Consult to hospitalist  [phone]   Pulse oximetry, continuous   Oxygen therapy Mode or (Route): Nasal cannula; Liters Per Minute: 2; Keep 02 saturation: 95 or better   Bipap   EKG 12-Lead   ED EKG   Insert peripheral IV   Admit to Inpatient (patient's expected length of stay will be greater than 2 midnights or inpatient only procedure)     OTHER Significant initial  Findings:  labs showing:    Recent Labs  Lab 09/26/21 2117  NA 144  K 3.7  CO2  18*  GLUCOSE 181*  BUN 66*  CREATININE 4.33*  CALCIUM 9.1  MG 2.3    Cr   stable,   Lab Results  Component Value Date   CREATININE 4.33 (H) 09/26/2021   CREATININE 5.06 (HH) 07/21/2021   CREATININE 4.77 (H) 07/14/2021    No results for input(s): AST, ALT, ALKPHOS, BILITOT, PROT, ALBUMIN in the last 168 hours. Lab Results  Component Value Date   CALCIUM 9.1 09/26/2021   PHOS 4.2 07/14/2021    Plt: Lab Results  Component Value Date   PLT 280 09/26/2021     COVID-19 Labs  No results for input(s): DDIMER, FERRITIN, LDH, CRP in the last 72 hours.  Lab Results  Component Value Date   SARSCOV2NAA NEGATIVE 07/08/2021   SARSCOV2NAA NEGATIVE 10/09/2020   SARSCOV2NAA NEGATIVE 09/16/2020   [place] NEGATIVE 02/23/2020   Ordered VBG       Recent Labs  Lab 09/26/21 2117  WBC 10.7*  HGB 9.9*  HCT 31.6*  MCV 86.1  PLT 280    HG/HCT Up from baseline see below    Component Value Date/Time   HGB 9.9 (L) 09/26/2021 2117   HGB 9.7 (L) 08/19/2019 1107   HCT 31.6 (L) 09/26/2021 2117   MCV 86.1 09/26/2021 2117  BNP (last 3 results) Recent Labs    07/08/21 2358 09/26/21 2117  BNP 2,229.6* 3,670.5*      DM  labs:  HbA1C: Recent Labs    07/09/21 0333  HGBA1C 6.3*       CBG (last 3)  No results for input(s): GLUCAP in the last 72 hours.        Cultures:    Component Value Date/Time   SDES BLOOD LEFT ANTECUBITAL 09/17/2020 0840   SDES BLOOD RIGHT ANTECUBITAL 09/17/2020 0840   SPECREQUEST  09/17/2020 0840    BOTTLES DRAWN AEROBIC AND ANAEROBIC Blood Culture results may not be optimal due to an inadequate volume of blood received in culture bottles   SPECREQUEST  09/17/2020 0840    BOTTLES DRAWN AEROBIC AND ANAEROBIC Blood Culture results may not be optimal due to an inadequate volume of blood received in culture bottles   CULT  09/17/2020 0840    NO GROWTH 5 DAYS Performed at [facility], [place] [address], [place], [place] [address]     CULT  09/17/2020 0840    NO GROWTH 5 DAYS Performed at [facility], [place] [address], [place], [place] [address]    REPTSTATUS 09/22/2020 FINAL 09/17/2020 0840   REPTSTATUS 09/22/2020 FINAL 09/17/2020 0840     Radiological Exams on Admission: DG Chest Portable 1 View  Result Date: 09/26/2021 CLINICAL DATA:  Shortness of breath, difficulty breathing EXAM: PORTABLE CHEST 1 VIEW COMPARISON:  07/09/2021 FINDINGS: Cardiomegaly with vascular congestion and early interstitial edema. No effusions or acute bony abnormality. IMPRESSION: Cardiomegaly, vascular congestion, early interstitial edema. Electronically Signed   By: Rolm Baptise M.D.   On: 09/26/2021 21:28   _______________________________________________________________________________________________________ Latest  Blood pressure (!) 124/95, pulse 91, temperature 98.1 F (36.7 C), temperature source Oral, resp. rate (!) 24, height '5\' 7"'  (1.702 m), weight 81.2 kg, SpO2 98 %.   Vitals  labs and radiology finding personally reviewed  Review of Systems:    Pertinent positives include:   fatigue, shortness of breath at rest.   dyspnea on exertion  Constitutional:  No weight loss, night sweats, Fevers, chills,weight loss  HEENT:  No headaches, Difficulty swallowing,Tooth/dental problems,Sore throat,  No sneezing, itching, ear ache, nasal congestion, post nasal drip,  Cardio-vascular:  No chest pain, Orthopnea, PND, anasarca, dizziness, palpitations.no Bilateral lower extremity swelling  GI:  No heartburn, indigestion, abdominal pain, nausea, vomiting, diarrhea, change in bowel habits, loss of appetite, melena, blood in stool, hematemesis Resp:  no , No excess mucus, no productive cough, No non-productive cough, No coughing up of blood.No change in color of mucus.No wheezing. Skin:  no rash or lesions. No jaundice GU:  no dysuria, change in color of urine, no urgency or frequency. No straining to urinate.  No flank pain.   Musculoskeletal:  No joint pain or no joint swelling. No decreased range of motion. No back pain.  Psych:  No change in mood or affect. No depression or anxiety. No memory loss.  Neuro: no localizing neurological complaints, no tingling, no weakness, no double vision, no gait abnormality, no slurred speech, no confusion  All systems reviewed and apart from [place] all are negative _______________________________________________________________________________________________ Past Medical History:   Past Medical History:  Diagnosis Date   ANEMIA DUE TO CHRONIC BLOOD LOSS 03/13/2007   CAROTID ARTERY STENOSIS 05/10/2010   CHF (congestive heart failure) ([place])    DIABETES MELLITUS, TYPE II 09/19/2007   DISEASE, CEREBROVASCULAR NEC 03/05/2007   GERD 03/13/2007   HYPERLIPIDEMIA  03/05/2007   HYPERTENSION 03/05/2007   HYPOKALEMIA 11/09/2009   KNEE PAIN, RIGHT 11/09/2009   PROSTATE CANCER, HX OF 03/05/2007   RENAL DISEASE, CHRONIC 02/03/2009     Past Surgical History:  Procedure Laterality Date   CAROTID ARTERY ANGIOPLASTY Right Oct. 10, 2001   ESOPHAGOGASTRODUODENOSCOPY (EGD) WITH PROPOFOL N/A 11/04/2016   Procedure: ESOPHAGOGASTRODUODENOSCOPY (EGD) WITH PROPOFOL;  Surgeon: Otis Brace, MD;  Location: [facility];  Service: Gastroenterology;  Laterality: N/A;   LAPAROSCOPIC APPENDECTOMY  02/20/2012   Procedure: APPENDECTOMY LAPAROSCOPIC;  Surgeon: Stark Klein, MD;  Location: [facility];  Service: General;  Laterality: N/A;   PROSTATE SURGERY     prostatectomy    Social History:  Ambulatory   independently      reports that he quit smoking about 47 years ago. His smoking use included cigarettes. He has a 10.00 pack-year smoking history. He has never used smokeless tobacco. He reports that he does not drink alcohol and does not use drugs.     Family History:   Family History  Problem Relation Age of Onset   Hypertension Mother    Cancer Father        Mesothelioma    Stomach cancer Brother     Cancer Brother    Cancer - Cervical Brother    Cancer Brother    Diabetes Brother    Esophageal cancer Neg Hx    Colon cancer Neg Hx    Pancreatic cancer Neg Hx    ______________________________________________________________________________________________ Allergies: Allergies  Allergen Reactions   Aspirin Other (See Comments)    High doses causes stomach ulcer and bleeding     Prior to Admission medications   Medication Sig Start Date End Date Taking? Authorizing Provider  ACCU-CHEK GUIDE test strip USED TO CHECK BLOOD GLUCOSE TWICE A DAY OR AS NEEDED 10/20/20   Nafziger, Tommi Rumps, NP  Accu-Chek Softclix Lancets lancets USED TO CHECK BLOOD GLUCOSE TWICE A DAY OR AS NEEDED 12/25/20   Nafziger, Tommi Rumps, NP  acetaminophen (TYLENOL) 325 MG tablet Take 325 mg by mouth 4 (four) times daily.    [provider]  albuterol (VENTOLIN HFA) 108 (90 Base) MCG/ACT inhaler TAKE 2 PUFFS BY MOUTH EVERY 6 HOURS AS NEEDED FOR WHEEZE OR SHORTNESS OF BREATH 08/04/21   Collene Gobble, MD  amLODipine (NORVASC) 10 MG tablet TAKE 1 TABLET BY MOUTH EVERY DAY 07/13/21   Nafziger, Tommi Rumps, NP  atorvastatin (LIPITOR) 20 MG tablet TAKE 1 TABLET BY MOUTH EVERY DAY Patient taking differently: Take 20 mg by mouth daily. 12/29/20   Nafziger, Tommi Rumps, NP  bisacodyl (DULCOLAX) 10 MG suppository Place 1 suppository (10 mg total) rectally daily as needed for mild constipation. 07/13/21   Geradine Girt, DO  Blood Glucose Monitoring Suppl (ACCU-CHEK AVIVA PLUS) w/Device KIT Used to check blood glucose 2 times a day or PRN 09/04/19   Nafziger, Tommi Rumps, NP  cholecalciferol (VITAMIN D3) 25 MCG (1000 UNIT) tablet Take 1,000 Units by mouth daily.    [provider]  cloNIDine (CATAPRES) 0.3 MG tablet Take 1 tablet (0.3 mg total) by mouth 3 (three) times daily. 09/20/20   Harold Hedge, MD  clopidogrel (PLAVIX) 75 MG tablet TAKE 1 TABLET BY MOUTH EVERY DAY 07/13/21   Nafziger, Tommi Rumps, NP  doxazosin (CARDURA) 2 MG tablet Take 2  mg by mouth at bedtime.  12/28/17   [provider]  esomeprazole (NEXIUM) 40 MG capsule TAKE 1 CAPSULE BY MOUTH EVERY DAY Patient taking differently: 80 mg daily. 05/13/21  Nafziger, Tommi Rumps, NP  glipiZIDE (GLUCOTROL XL) 5 MG 24 hr tablet TAKE 1 TABLET BY MOUTH EVERY DAY 09/16/21   Nafziger, Tommi Rumps, NP  hydrocortisone (ANUSOL-HC) 2.5 % rectal cream PLACE 1 APPLICATION RECTALLY 2 TIMES A DAY. Patient taking differently: Place 1 application rectally 2 (two) times daily. 03/31/21   Burchette, Alinda Sierras, MD  isosorbide-hydrALAZINE (BIDIL) 20-37.5 MG tablet Take 1 tablet by mouth 3 (three) times daily.    [provider]  KLOR-CON M10 10 MEQ tablet Take 2 tablets (20 mEq total) by mouth 2 (two) times daily. Patient taking differently: Take 10 mEq by mouth 2 (two) times daily. 09/20/20 08/28/21  Harold Hedge, MD  lactulose ([place]) 10 GM/15ML solution TAKE 15 ML BY MOUTH 2 TIMES DAILY AS NEEDED FOR MILD CONSTIPATION. Patient taking differently: Take 10 g by mouth 2 (two) times daily as needed for mild constipation. 03/18/21   Nafziger, Tommi Rumps, NP  metoprolol succinate (TOPROL-XL) 100 MG 24 hr tablet Take 1 tablet (100 mg total) by mouth daily. Take with or immediately following a meal. 09/21/20 08/28/21  Harold Hedge, MD  polyethylene glycol (MIRALAX / GLYCOLAX) 17 g packet Take 17 g by mouth daily as needed for mild constipation.    [provider]  Propylene Glycol (SYSTANE BALANCE OP) Place 1 drop into both eyes daily as needed (dry eyes).    [provider]  torsemide 40 MG TABS Take 40 mg by mouth daily. 07/14/21   Geradine Girt, DO  triamcinolone cream (KENALOG) 0.1 % Apply 1 application topically daily as needed (dry/irritated skin). 07/05/19   [provider]    ___________________________________________________________________________________________________ Physical Exam: Vitals with BMI 09/26/2021 09/26/2021 09/26/2021  Height - - -  Weight - - -  BMI - - -   Systolic [phone]  Diastolic 95 82 97  Pulse - 91 86     1. General:  in No  Acute distress   Chronically ill   -appearing 2. Psychological: Alert and   Oriented 3. Head/ENT:   Moist  Mucous Membranes                          Head Non traumatic, neck supple                          Poor Dentition 4. SKIN:  decreased Skin turgor,  Skin clean Dry and intact no rash 5. Heart: Regular rate and rhythm no  Murmur, no Rub or gallop 6. Lungs:   no wheezes or crackles  on BiPAP 7. Abdomen: Soft,  non-tender, Non distended   obese   8. Lower extremities: no clubbing, cyanosis, trace edema 9. Neurologically Grossly intact, moving all 4 extremities equally  10. MSK: Normal range of motion    Chart has been reviewed  ______________________________________________________________________________________________  Assessment/Plan 87 y.o. male with medical history significant of diastolic CHF, DM2 HTN, HLD   Admitted for CHF exacerbation  Present on Admission:  Diabetes mellitus with renal complications ([place])  Essential hypertension  CKD (chronic kidney disease), stage V ([place])  Acute on chronic diastolic CHF (congestive heart failure) (HCC)  Acute respiratory failure with hypoxia (HCC)  Iron deficiency anemia due to chronic blood loss  Dyslipidemia  Pulmonary nodule 1 cm or greater in diameter  UTI (urinary tract infection)     Acute on chronic diastolic CHF (congestive heart failure) ([place]) - Pt diagnosed with CHF based on  presence of the following  PND, OA, rales on exam  Pulmonary edema on CXR, and   bilateral leg edema, With noted response to IV diuretic in ER  admit on telemetry,  cycle cardiac enzymes  obtain serial ECG  to evaluate for ischemia as a cause of heart failure  monitor daily weight:  [facility] Weights   09/26/21 2055  Weight: 81.2 kg   Last BNP BNP (last 3 results) Recent Labs    07/08/21 2358 09/26/21 2117  BNP 2,229.6* 3,670.5*       diurese with IV  lasix and monitor orthostatics and creatinine to avoid over diuresis.  Order echogram to evaluate EF and valves  ACE/ARBi   Contraindicated    cardiology consulted   In the pat needed Metolazone   Diabetes mellitus with renal complications (HCC)  - Order Sensitive * SSI     -  check TSH and HgA1C      Essential hypertension Will diurese,  hold Norvasc as this increases leg edema Once BP is stable would restart BB and Bidil Noted bp is fluctuating widely in ER   CKD (chronic kidney disease), stage V (HCC)  -chronic avoid nephrotoxic medications such as NSAIDs, Vanco Zosyn combo,  avoid hypotension, continue to follow renal function   Iron deficiency anemia due to chronic blood loss Obtain anemia panel hemoglobin improving continue to monitor  PROSTATE CANCER, HX OF Chronic  Acute respiratory failure with hypoxia (HCC)  this patient has acute respiratory failure with Hypoxia  as documented by the presence of following: O2 saturatio< 90% on RA Likely due to:    CHF exacerbation  Provide O2 therapy and titrate as needed  Continuous pulse ox  check Pulse ox with ambulation prior to discharge   may need  TC consult for home O2 set up    Dyslipidemia Stable continue home meds  Pulmonary nodule 1 cm or greater in diameter - Followed by pulmonology with plan for repeat CT in April 2023   UTI (urinary tract infection)  - treat with Rocephin        await results of urine culture and adjust antibiotic coverage as needed    Other plan as per orders.  DVT prophylaxis:  SCD       Code Status:    Code Status: Prior FULL CODE  as per patient  I had personally discussed CODE STATUS with patient and family     Family Communication:   Family    at  Bedside  plan of care was discussed  with Wife,   Disposition Plan:    To home once workup is complete and patient is stable   Following barriers for discharge:                            Electrolytes corrected                                Anemia stable                                                       Will likely need home health, home O2, set up  Will need consultants to evaluate patient prior to discharge                       Would benefit from PT/OT eval prior to DC  Ordered                                       Consults called:  touched base with Dr Candiss Norse will see in AM Emailed cardiology   Admission status:  ED Disposition     ED Disposition  [place]: [place] [[id number]]  Level of Care: Progressive [102]  Admit to Progressive based on following criteria: CARDIOVASCULAR & THORACIC of moderate stability with acute coronary syndrome symptoms/low risk myocardial infarction/hypertensive urgency/arrhythmias/heart failure potentially compromising stability and stable post cardiovascular intervention patients.  May admit patient to [facility] or [facility] if equivalent level of care is available:: No  Covid Evaluation: Asymptomatic Screening Protocol (No Symptoms)  Diagnosis: CHF exacerbation [facility]) [[id number]]  Admitting Physician: Toy Baker [[id number]]  Attending Physician: Toy Baker [[id number]]  Estimated length of stay: past midnight tomorrow  Certification:: I certify this patient will need inpatient services for at least 2 midnights          inpatient     I Expect 2 midnight stay secondary to severity of patient's current illness need for inpatient interventions justified by the following:  hemodynamic instability despite optimal treatment ( hypoxia,  )   Severe lab/radiological/exam abnormalities including:    CHF and extensive comorbidities including:  DM2   CHF     CKD   malignancy,    That are currently affecting medical management.   I expect  patient to be hospitalized for 2 midnights requiring inpatient medical care.  Patient is at high risk for adverse outcome (such as loss of  life or disability) if not treated.  Indication for inpatient stay as follows:    New or worsening hypoxia   Need for IV diuretics need for biPAP    Level of care   progressive tele indefinitely please discontinue once patient no longer qualifies COVID-19 Labs    Lab Results  Component Value Date   [place] 07/08/2021     Precautions: admitted as asymptomatic screening protocol     Bryceton Hantz 09/27/2021, 1:30 AM    [facility]     after 2 AM please page floor coverage PA If 7AM-7PM, please contact the day team taking care of the patient using [email or web address]   Patient was evaluated in the context of the global COVID-19 pandemic, which necessitated consideration that the patient might be at risk for infection with the SARS-CoV-2 virus that causes COVID-19. Institutional protocols and algorithms that pertain to the evaluation of patients at risk for COVID-19 are in a state of rapid change based on information released by regulatory bodies including the CDC and federal and state organizations. These policies and algorithms were followed during the patient's care.

## 2021-09-26 NOTE — Assessment & Plan Note (Signed)
-   Order Sensitive SSI  °  ° -  check TSH and HgA1C °  ° ° °

## 2021-09-26 NOTE — Assessment & Plan Note (Addendum)
-   Pt diagnosed with CHF based on presence of the following  PND, OA, rales on exam  Pulmonary edema on CXR, and   bilateral leg edema, With noted response to IV diuretic in ER  admit on telemetry,  cycle cardiac enzymes  obtain serial ECG  to evaluate for ischemia as a cause of heart failure  monitor daily weight:  [facility] Weights   09/26/21 2055  Weight: 81.2 kg   Last BNP BNP (last 3 results) Recent Labs    07/08/21 2358 09/26/21 2117  BNP 2,229.6* 3,670.5*       diurese with IV lasix and monitor orthostatics and creatinine to avoid over diuresis.  Order echogram to evaluate EF and valves  ACE/ARBi   Contraindicated    cardiology consulted   In the pat needed Metolazone

## 2021-09-26 NOTE — Assessment & Plan Note (Signed)
Obtain anemia panel hemoglobin improving continue to monitor

## 2021-09-26 NOTE — Assessment & Plan Note (Signed)
Stable - continue home meds 

## 2021-09-26 NOTE — Progress Notes (Addendum)
Informed by primary service of patient in ER. Patient with CKD5 presenting with CHF exacerbation. Followed by Dr. Royce Macadamia, [facility]. Currently being evaluated for peritoneal dialysis. Cr relatively stable as compared to his labs on 08/05/2021 (4.11). Agree with lasix and metolazone. Hopefully can medically stabilize/improve volume status that way he can get set up for PD outpatient otherwise if refractory to treatment, will need to consider initiating RRT in-house. Can start nahco3 1300mg  TID. Full consult to follow in AM.  Gean Quint, MD [facility]

## 2021-09-26 NOTE — ED Triage Notes (Signed)
Pt c/o shortness of breath and bilateral leg swelling x 2 days. Hx CHF, reports taking lasix as prescribed.

## 2021-09-26 NOTE — Assessment & Plan Note (Addendum)
Will diurese,  hold Norvasc as this increases leg edema Once BP is stable would restart BB and Bidil Noted bp is fluctuating widely in ER

## 2021-09-26 NOTE — ED Provider Notes (Signed)
[facility] EMERGENCY DEPARTMENT Provider Note   CSN: [id number] Arrival date & time: 09/26/21  2034     History  Chief Complaint  Patient presents with   Shortness of Breath    Austin [name] is a 87 y.o. male with history of CHF, GERD, hypertension, prostate cancer, DM 2.  Patient presents to ED for evaluation of shortness of breath.  Patient states that shortness of breath began 2 days ago and became worse last night along with bilateral leg swelling.  Patient states that shortness of breath is worse when he lies down flat on his back, relieved when he sits up.  Patient states that this occurred 2 months ago when he was admitted.  Patient endorsing shortness of breath, bilateral lower extremity swelling.  Patient denies chest pains, fevers, abdominal pain.   Shortness of Breath Associated symptoms: no abdominal pain, no chest pain, no fever and no vomiting       Home Medications Prior to Admission medications   Medication Sig Start Date End Date Taking? Authorizing Provider  ACCU-CHEK GUIDE test strip USED TO CHECK BLOOD GLUCOSE TWICE A DAY OR AS NEEDED 10/20/20   Nafziger, Tommi Rumps, NP  Accu-Chek Softclix Lancets lancets USED TO CHECK BLOOD GLUCOSE TWICE A DAY OR AS NEEDED 12/25/20   Nafziger, Tommi Rumps, NP  acetaminophen (TYLENOL) 325 MG tablet Take 325 mg by mouth 4 (four) times daily.    [provider]  albuterol (VENTOLIN HFA) 108 (90 Base) MCG/ACT inhaler TAKE 2 PUFFS BY MOUTH EVERY 6 HOURS AS NEEDED FOR WHEEZE OR SHORTNESS OF BREATH 08/04/21   Collene Gobble, MD  amLODipine (NORVASC) 10 MG tablet TAKE 1 TABLET BY MOUTH EVERY DAY 07/13/21   Nafziger, Tommi Rumps, NP  atorvastatin (LIPITOR) 20 MG tablet TAKE 1 TABLET BY MOUTH EVERY DAY Patient taking differently: Take 20 mg by mouth daily. 12/29/20   Nafziger, Tommi Rumps, NP  bisacodyl (DULCOLAX) 10 MG suppository Place 1 suppository (10 mg total) rectally daily as needed for mild constipation. 07/13/21   Geradine Girt, DO   Blood Glucose Monitoring Suppl (ACCU-CHEK AVIVA PLUS) w/Device KIT Used to check blood glucose 2 times a day or PRN 09/04/19   Nafziger, Tommi Rumps, NP  cholecalciferol (VITAMIN D3) 25 MCG (1000 UNIT) tablet Take 1,000 Units by mouth daily.    [provider]  cloNIDine (CATAPRES) 0.3 MG tablet Take 1 tablet (0.3 mg total) by mouth 3 (three) times daily. 09/20/20   Harold Hedge, MD  clopidogrel (PLAVIX) 75 MG tablet TAKE 1 TABLET BY MOUTH EVERY DAY 07/13/21   Nafziger, Tommi Rumps, NP  doxazosin (CARDURA) 2 MG tablet Take 2 mg by mouth at bedtime.  12/28/17   [provider]  esomeprazole (NEXIUM) 40 MG capsule TAKE 1 CAPSULE BY MOUTH EVERY DAY Patient taking differently: 80 mg daily. 05/13/21   Nafziger, Tommi Rumps, NP  glipiZIDE (GLUCOTROL XL) 5 MG 24 hr tablet TAKE 1 TABLET BY MOUTH EVERY DAY 09/16/21   Nafziger, Tommi Rumps, NP  hydrocortisone (ANUSOL-HC) 2.5 % rectal cream PLACE 1 APPLICATION RECTALLY 2 TIMES A DAY. Patient taking differently: Place 1 application rectally 2 (two) times daily. 03/31/21   Burchette, Alinda Sierras, MD  isosorbide-hydrALAZINE (BIDIL) 20-37.5 MG tablet Take 1 tablet by mouth 3 (three) times daily.    [provider]  KLOR-CON M10 10 MEQ tablet Take 2 tablets (20 mEq total) by mouth 2 (two) times daily. Patient taking differently: Take 10 mEq by mouth 2 (two) times daily. 09/20/20 08/28/21  Neysa Bonito,  Chauncy Passy, MD  lactulose (CHRONULAC) 10 GM/15ML solution TAKE 15 ML BY MOUTH 2 TIMES DAILY AS NEEDED FOR MILD CONSTIPATION. Patient taking differently: Take 10 g by mouth 2 (two) times daily as needed for mild constipation. 03/18/21   Nafziger, Tommi Rumps, NP  metoprolol succinate (TOPROL-XL) 100 MG 24 hr tablet Take 1 tablet (100 mg total) by mouth daily. Take with or immediately following a meal. 09/21/20 08/28/21  Harold Hedge, MD  polyethylene glycol (MIRALAX / GLYCOLAX) 17 g packet Take 17 g by mouth daily as needed for mild constipation.    [provider]  Propylene Glycol  (SYSTANE BALANCE OP) Place 1 drop into both eyes daily as needed (dry eyes).    [provider]  torsemide 40 MG TABS Take 40 mg by mouth daily. 07/14/21   Geradine Girt, DO  triamcinolone cream (KENALOG) 0.1 % Apply 1 application topically daily as needed (dry/irritated skin). 07/05/19   [provider]      Allergies    Aspirin    Review of Systems   Review of Systems  Constitutional:  Negative for chills and fever.  Respiratory:  Positive for shortness of breath.   Cardiovascular:  Positive for leg swelling. Negative for chest pain.  Gastrointestinal:  Negative for abdominal pain, nausea and vomiting.  All other systems reviewed and are negative.  Physical Exam Updated Vital Signs BP (!) 124/95    Pulse 91    Temp 98.1 F (36.7 C) (Oral)    Resp (!) 24    Ht _0  (1.702 m)    Wt 81.2 kg    SpO2 98%    BMI 28.04 kg/m  Physical Exam Vitals and nursing note reviewed.  Constitutional:      General: He is not in acute distress.    Appearance: He is not ill-appearing, toxic-appearing or diaphoretic.  HENT:     Head: Normocephalic and atraumatic.  Eyes:     Extraocular Movements: Extraocular movements intact.     Pupils: Pupils are equal, round, and reactive to light.  Cardiovascular:     Rate and Rhythm: Normal rate and regular rhythm.  Pulmonary:     Effort: Tachypnea present.     Breath sounds: Examination of the right-middle field reveals rales. Examination of the left-middle field reveals rales. Examination of the right-lower field reveals rales. Examination of the left-lower field reveals rales. Rales present.  Abdominal:     General: Bowel sounds are normal.     Palpations: Abdomen is soft.  Musculoskeletal:     Cervical back: Normal range of motion and neck supple.     Right lower leg: 1+ Pitting Edema present.     Left lower leg: 1+ Pitting Edema present.  Skin:    General: Skin is warm and dry.     Capillary Refill: Capillary refill takes less  than 2 seconds.  Neurological:     Mental Status: He is alert and oriented to person, place, and time.    ED Results / Procedures / Treatments   Labs (all labs ordered are listed, but only abnormal results are displayed) Labs Reviewed  BASIC METABOLIC PANEL - Abnormal; Notable for the following components:      Result Value   Chloride 113 (*)    CO2 18 (*)    Glucose, Bld 181 (*)    BUN 66 (*)    Creatinine, Ser 4.33 (*)    GFR, Estimated 13 (*)    All other components within  normal limits  BRAIN NATRIURETIC PEPTIDE - Abnormal; Notable for the following components:   B Natriuretic Peptide 3,670.5 (*)    All other components within normal limits  CBC - Abnormal; Notable for the following components:   WBC 10.7 (*)    RBC 3.67 (*)    Hemoglobin 9.9 (*)    HCT 31.6 (*)    RDW 17.0 (*)    All other components within normal limits  RESP PANEL BY RT-PCR (FLU A&B, COVID) ARPGX2  MAGNESIUM  URINALYSIS, ROUTINE W REFLEX MICROSCOPIC  VITAMIN B12  FOLATE  IRON AND TIBC  FERRITIN  RETICULOCYTES  HEMOGLOBIN A1C  TROPONIN I (HIGH SENSITIVITY)    EKG None  Radiology DG Chest Portable 1 View  Result Date: 09/26/2021 CLINICAL DATA:  Shortness of breath, difficulty breathing EXAM: PORTABLE CHEST 1 VIEW COMPARISON:  07/09/2021 FINDINGS: Cardiomegaly with vascular congestion and early interstitial edema. No effusions or acute bony abnormality. IMPRESSION: Cardiomegaly, vascular congestion, early interstitial edema. Electronically Signed   By: Rolm Baptise M.D.   On: 09/26/2021 21:28    Procedures .Critical Care Performed by: Azucena Cecil, PA-C Authorized by: Azucena Cecil, PA-C   Critical care provider statement:    Critical care time (minutes):  45   Critical care was necessary to treat or prevent imminent or life-threatening deterioration of the following conditions:  Cardiac failure and respiratory failure   Critical care was time spent personally by me on the  following activities:  Obtaining history from patient or surrogate, interpretation of cardiac output measurements, examination of patient, evaluation of patient's response to treatment, discussions with primary provider, discussions with consultants, development of treatment plan with patient or surrogate, blood draw for specimens, ordering and performing treatments and interventions, ordering and review of laboratory studies, ordering and review of radiographic studies, re-evaluation of patient's condition, pulse oximetry and review of old charts   I assumed direction of critical care for this patient from another provider in my specialty: no     Care discussed with: admitting provider     Medications Ordered in ED Medications  furosemide (LASIX) injection 60 mg (has no administration in time range)  insulin aspart (novoLOG) injection 0-9 Units (has no administration in time range)  furosemide (LASIX) injection 20 mg (20 mg Intravenous Given 09/26/21 2130)  potassium chloride SA (KLOR-CON M) CR tablet 20 mEq (20 mEq Oral Given 09/26/21 2133)    ED Course/ Medical Decision Making/ A&P                           Medical Decision Making Amount and/or Complexity of Data Reviewed Labs: ordered. Radiology: ordered.  Risk Prescription drug management.   87 year old male presents due to shortness of breath x2 days.  Patient has history of CHF.  On examination, the patient has Rales bilaterally to the middle and lower lung fields, 1+ pitting edema.  During the course of the patient's care, he desaturated down to 80% on room air.  At this time I placed the patient on 6 L of oxygen via nasal cannula he improved back up to 95%.  Patient had audible rails and was working extremely hard to breathe so at this time I decided to place patient on BiPAP.  Patient responded well to the BiPAP, stated that he was able to breathe much better and did not feel as short of breath.  Patient lab work includes the  following: CBC, mag, BNP, BMP, chest x-ray,  EKG CBC shows slightly elevated white blood cell count of 10.7.  Hemoglobin is at baseline BMP shows elevated creatinine of 4.3, this is in line with patient baseline BNP is slightly elevated at 3670.  Patient baseline appears to be between 1000 and 2000. Patient chest x-ray interpreted by me shows vascular congestion and early interstitial edema.  At this time, I feel that this patient is appropriate for admission due to congestive heart failure exacerbation.  He has been placed on BiPAP and will need to be monitored to ensure that he is stabilized.  Hospitalist was paged, Dr. Roel Cluck has agreed to admit the patient.  Orders have been placed at her request.  The patient is stable at time of admission.   Final Clinical Impression(s) / ED Diagnoses Final diagnoses:  SOB (shortness of breath)  Acute on chronic congestive heart failure, unspecified heart failure type [facility])    Rx / DC Orders ED Discharge Orders     None         Lawana Chambers 09/26/21 2259    Davonna Belling, MD 09/27/21 0028

## 2021-09-26 NOTE — Assessment & Plan Note (Signed)
this patient has acute respiratory failure with Hypoxia  as documented by the presence of following: °O2 saturatio< 90% on RA  Likely due to:  CHF exacerbation  °Provide O2 therapy and titrate as needed ° Continuous pulse ox °  check Pulse ox with ambulation prior to discharge °  may need  TC consult for home O2 set up °  °

## 2021-09-26 NOTE — Subjective & Objective (Signed)
Presents with worsening shortness of breath bilateral leg swelling for the past 2 days he has been taking his Lasix as prescribed he has known history of CHF. He is shortness of breath has progressively have gotten worse in the last night he had more severe presentation.  Seems to be better when he sits up is really bad when he lays back down.  Similar presentation was 2 months ago when he got admitted.  No associated chest pain fevers no abdominal pain.

## 2021-09-26 NOTE — ED Notes (Signed)
Pt placed on 2 L via nasal cannula per RN

## 2021-09-26 NOTE — ED Notes (Signed)
The pt has been sob for several days tonight his breathing was worse  he has been admitted and placed on bi=pap in the past.  Alert and oriented x 4 wife at  the bedside

## 2021-09-26 NOTE — Progress Notes (Signed)
RT placed patient on BIPAP at this time. Pt tolerating settings well. RT will monitor as needed.

## 2021-09-26 NOTE — Assessment & Plan Note (Signed)
-   Followed by pulmonology with plan for repeat CT in April 2023

## 2021-09-27 ENCOUNTER — Encounter (HOSPITAL_COMMUNITY): Payer: Self-pay | Admitting: Internal Medicine

## 2021-09-27 ENCOUNTER — Other Ambulatory Visit (HOSPITAL_COMMUNITY): Payer: Medicare PPO

## 2021-09-27 DIAGNOSIS — I509 Heart failure, unspecified: Secondary | ICD-10-CM

## 2021-09-27 DIAGNOSIS — R0602 Shortness of breath: Secondary | ICD-10-CM

## 2021-09-27 DIAGNOSIS — N390 Urinary tract infection, site not specified: Secondary | ICD-10-CM | POA: Diagnosis present

## 2021-09-27 LAB — FERRITIN: Ferritin: 59 ng/mL (ref 24–336)

## 2021-09-27 LAB — URINALYSIS, MICROSCOPIC (REFLEX)

## 2021-09-27 LAB — COMPREHENSIVE METABOLIC PANEL
ALT: 13 U/L (ref 0–44)
AST: 12 U/L — ABNORMAL LOW (ref 15–41)
Albumin: 3.6 g/dL (ref 3.5–5.0)
Alkaline Phosphatase: 84 U/L (ref 38–126)
Anion gap: 14 (ref 5–15)
BUN: 64 mg/dL — ABNORMAL HIGH (ref 8–23)
CO2: 22 mmol/L (ref 22–32)
Calcium: 8.9 mg/dL (ref 8.9–10.3)
Chloride: 111 mmol/L (ref 98–111)
Creatinine, Ser: 4.52 mg/dL — ABNORMAL HIGH (ref 0.61–1.24)
GFR, Estimated: 12 mL/min — ABNORMAL LOW (ref >60)
Glucose, Bld: 122 mg/dL — ABNORMAL HIGH (ref 70–99)
Potassium: 3.4 mmol/L — ABNORMAL LOW (ref 3.5–5.1)
Sodium: 147 mmol/L — ABNORMAL HIGH (ref 135–145)
Total Bilirubin: 0.6 mg/dL (ref 0.3–1.2)
Total Protein: 6.8 g/dL (ref 6.5–8.1)

## 2021-09-27 LAB — CBC WITH DIFFERENTIAL/PLATELET
Abs Immature Granulocytes: 0.05 10*3/uL (ref 0.00–0.07)
Basophils Absolute: 0 10*3/uL (ref 0.0–0.1)
Basophils Relative: 0 %
Eosinophils Absolute: 0 10*3/uL (ref 0.0–0.5)
Eosinophils Relative: 0 %
HCT: 30.8 % — ABNORMAL LOW (ref 39.0–52.0)
Hemoglobin: 9.5 g/dL — ABNORMAL LOW (ref 13.0–17.0)
Immature Granulocytes: 0 %
Lymphocytes Relative: 4 %
Lymphs Abs: 0.4 10*3/uL — ABNORMAL LOW (ref 0.7–4.0)
MCH: 26.8 pg (ref 26.0–34.0)
MCHC: 30.8 g/dL (ref 30.0–36.0)
MCV: 86.8 fL (ref 80.0–100.0)
Monocytes Absolute: 0.7 10*3/uL (ref 0.1–1.0)
Monocytes Relative: 6 %
Neutro Abs: 10.3 10*3/uL — ABNORMAL HIGH (ref 1.7–7.7)
Neutrophils Relative %: 90 %
Platelets: 244 10*3/uL (ref 150–400)
RBC: 3.55 MIL/uL — ABNORMAL LOW (ref 4.22–5.81)
RDW: 17 % — ABNORMAL HIGH (ref 11.5–15.5)
WBC: 11.5 10*3/uL — ABNORMAL HIGH (ref 4.0–10.5)
nRBC: 0 % (ref 0.0–0.2)

## 2021-09-27 LAB — RETICULOCYTES
Immature Retic Fract: 12.6 % (ref 2.3–15.9)
RBC.: 3.31 MIL/uL — ABNORMAL LOW (ref 4.22–5.81)
Retic Count, Absolute: 29.8 10*3/uL (ref 19.0–186.0)
Retic Ct Pct: 0.9 % (ref 0.4–3.1)

## 2021-09-27 LAB — IRON AND TIBC
Iron: 45 ug/dL (ref 45–182)
Saturation Ratios: 19 % (ref 17.9–39.5)
TIBC: 239 ug/dL — ABNORMAL LOW (ref 250–450)
UIBC: 194 ug/dL

## 2021-09-27 LAB — I-STAT VENOUS BLOOD GAS, ED
Acid-base deficit: 3 mmol/L — ABNORMAL HIGH (ref 0.0–2.0)
Bicarbonate: 20.8 mmol/L (ref 20.0–28.0)
Calcium, Ion: 1.15 mmol/L (ref 1.15–1.40)
HCT: 27 % — ABNORMAL LOW (ref 39.0–52.0)
Hemoglobin: 9.2 g/dL — ABNORMAL LOW (ref 13.0–17.0)
O2 Saturation: 99 %
Potassium: 3.7 mmol/L (ref 3.5–5.1)
Sodium: 148 mmol/L — ABNORMAL HIGH (ref 135–145)
TCO2: 22 mmol/L (ref 22–32)
pCO2, Ven: 30 mmHg — ABNORMAL LOW (ref 44.0–60.0)
pH, Ven: 7.45 — ABNORMAL HIGH (ref 7.250–7.430)
pO2, Ven: 126 mmHg — ABNORMAL HIGH (ref 32.0–45.0)

## 2021-09-27 LAB — TSH
TSH: 0.62 u[IU]/mL (ref 0.350–4.500)
TSH: 0.545 u[IU]/mL (ref 0.350–4.500)

## 2021-09-27 LAB — HEPATIC FUNCTION PANEL
ALT: 13 U/L (ref 0–44)
AST: 13 U/L — ABNORMAL LOW (ref 15–41)
Albumin: 3.4 g/dL — ABNORMAL LOW (ref 3.5–5.0)
Alkaline Phosphatase: 81 U/L (ref 38–126)
Bilirubin, Direct: <0.1 mg/dL (ref 0.0–0.2)
Total Bilirubin: 0.4 mg/dL (ref 0.3–1.2)
Total Protein: 7.1 g/dL (ref 6.5–8.1)

## 2021-09-27 LAB — DIFFERENTIAL
Abs Immature Granulocytes: 0.04 10*3/uL (ref 0.00–0.07)
Basophils Absolute: 0 10*3/uL (ref 0.0–0.1)
Basophils Relative: 0 %
Eosinophils Absolute: 0 10*3/uL (ref 0.0–0.5)
Eosinophils Relative: 0 %
Immature Granulocytes: 1 %
Lymphocytes Relative: 5 %
Lymphs Abs: 0.4 10*3/uL — ABNORMAL LOW (ref 0.7–4.0)
Monocytes Absolute: 0.4 10*3/uL (ref 0.1–1.0)
Monocytes Relative: 5 %
Neutro Abs: 8.2 10*3/uL — ABNORMAL HIGH (ref 1.7–7.7)
Neutrophils Relative %: 89 %

## 2021-09-27 LAB — URINALYSIS, ROUTINE W REFLEX MICROSCOPIC
Bilirubin Urine: NEGATIVE
Glucose, UA: NEGATIVE mg/dL
Ketones, ur: NEGATIVE mg/dL
Leukocytes,Ua: NEGATIVE
Nitrite: POSITIVE — AB
Protein, ur: >300 mg/dL — AB
Specific Gravity, Urine: 1.02 (ref 1.005–1.030)
pH: 6 (ref 5.0–8.0)

## 2021-09-27 LAB — RESP PANEL BY RT-PCR (FLU A&B, COVID) ARPGX2
Influenza A by PCR: NEGATIVE
Influenza B by PCR: NEGATIVE
SARS Coronavirus 2 by RT PCR: NEGATIVE

## 2021-09-27 LAB — GLUCOSE, CAPILLARY
Glucose-Capillary: 106 mg/dL — ABNORMAL HIGH (ref 70–99)
Glucose-Capillary: 102 mg/dL — ABNORMAL HIGH (ref 70–99)

## 2021-09-27 LAB — CBG MONITORING, ED
Glucose-Capillary: 108 mg/dL — ABNORMAL HIGH (ref 70–99)
Glucose-Capillary: 119 mg/dL — ABNORMAL HIGH (ref 70–99)
Glucose-Capillary: 124 mg/dL — ABNORMAL HIGH (ref 70–99)
Glucose-Capillary: 72 mg/dL (ref 70–99)

## 2021-09-27 LAB — TROPONIN I (HIGH SENSITIVITY): Troponin I (High Sensitivity): 137 ng/L (ref <18)

## 2021-09-27 LAB — FOLATE: Folate: 19.9 ng/mL (ref >5.9)

## 2021-09-27 LAB — HEMOGLOBIN A1C
Hgb A1c MFr Bld: 6.1 % — ABNORMAL HIGH (ref 4.8–5.6)
Mean Plasma Glucose: 128.37 mg/dL

## 2021-09-27 LAB — PHOSPHORUS
Phosphorus: 4.1 mg/dL (ref 2.5–4.6)
Phosphorus: 4.2 mg/dL (ref 2.5–4.6)

## 2021-09-27 LAB — VITAMIN B12: Vitamin B-12: 158 pg/mL — ABNORMAL LOW (ref 180–914)

## 2021-09-27 LAB — MAGNESIUM: Magnesium: 2.4 mg/dL (ref 1.7–2.4)

## 2021-09-27 MED ORDER — SODIUM CHLORIDE 0.9 % IV SOLN
1.0000 g | INTRAVENOUS | Status: DC
  Administered 2021-09-28 – 2021-09-30 (×3): 1 g via INTRAVENOUS
  Filled 2021-09-27 (×3): qty 10

## 2021-09-27 MED ORDER — ATORVASTATIN CALCIUM 10 MG PO TABS
20.0000 mg | ORAL_TABLET | Freq: Every day | ORAL | Status: DC
Start: 2021-09-27 — End: 2021-09-30
  Administered 2021-09-27 – 2021-09-30 (×4): 20 mg via ORAL
  Filled 2021-09-27 (×4): qty 2

## 2021-09-27 MED ORDER — SODIUM CHLORIDE 0.9 % IV SOLN
250.0000 mL | INTRAVENOUS | Status: DC | PRN
  Administered 2021-09-30: 250 mL via INTRAVENOUS

## 2021-09-27 MED ORDER — FUROSEMIDE 10 MG/ML IJ SOLN
80.0000 mg | Freq: Once | INTRAMUSCULAR | Status: AC
  Administered 2021-09-28: 80 mg via INTRAVENOUS
  Filled 2021-09-27: qty 8

## 2021-09-27 MED ORDER — HYDRALAZINE HCL 20 MG/ML IJ SOLN
10.0000 mg | Freq: Once | INTRAMUSCULAR | Status: AC
  Administered 2021-09-28: 10 mg via INTRAVENOUS
  Filled 2021-09-27: qty 1

## 2021-09-27 MED ORDER — ACETAMINOPHEN 650 MG RE SUPP: 650.0000 mg | Freq: Four times a day (QID) | RECTAL | Status: DC | PRN

## 2021-09-27 MED ORDER — FUROSEMIDE 10 MG/ML IJ SOLN
80.0000 mg | Freq: Once | INTRAMUSCULAR | Status: AC
  Administered 2021-09-27: 80 mg via INTRAVENOUS
  Filled 2021-09-27: qty 8

## 2021-09-27 MED ORDER — CLONIDINE HCL 0.1 MG PO TABS
0.1000 mg | ORAL_TABLET | Freq: Two times a day (BID) | ORAL | Status: DC
  Administered 2021-09-27: 0.1 mg via ORAL
  Filled 2021-09-27: qty 1

## 2021-09-27 MED ORDER — ALBUTEROL SULFATE HFA 108 (90 BASE) MCG/ACT IN AERS
2.0000 | INHALATION_SPRAY | RESPIRATORY_TRACT | Status: DC | PRN
  Filled 2021-09-27: qty 6.7

## 2021-09-27 MED ORDER — METOPROLOL SUCCINATE ER 100 MG PO TB24
100.0000 mg | ORAL_TABLET | Freq: Every day | ORAL | Status: DC
  Administered 2021-09-27 – 2021-09-30 (×4): 100 mg via ORAL
  Filled 2021-09-27 (×3): qty 1
  Filled 2021-09-27: qty 4

## 2021-09-27 MED ORDER — FUROSEMIDE 10 MG/ML IJ SOLN
120.0000 mg | Freq: Two times a day (BID) | INTRAVENOUS | Status: DC
  Administered 2021-09-27: 120 mg via INTRAVENOUS
  Filled 2021-09-27: qty 12
  Filled 2021-09-27: qty 10

## 2021-09-27 MED ORDER — HYDROCODONE-ACETAMINOPHEN 5-325 MG PO TABS: 1.0000 | ORAL_TABLET | ORAL | Status: DC | PRN

## 2021-09-27 MED ORDER — ACETAMINOPHEN 325 MG PO TABS
650.0000 mg | ORAL_TABLET | Freq: Four times a day (QID) | ORAL | Status: DC | PRN
  Administered 2021-09-30: 650 mg via ORAL
  Filled 2021-09-27: qty 2

## 2021-09-27 MED ORDER — SODIUM CHLORIDE 0.9% FLUSH
3.0000 mL | Freq: Two times a day (BID) | INTRAVENOUS | Status: DC
  Administered 2021-09-27 – 2021-09-29 (×6): 3 mL via INTRAVENOUS

## 2021-09-27 MED ORDER — POTASSIUM CHLORIDE CRYS ER 20 MEQ PO TBCR
20.0000 meq | EXTENDED_RELEASE_TABLET | Freq: Once | ORAL | Status: AC
  Administered 2021-09-27: 20 meq via ORAL
  Filled 2021-09-27: qty 1

## 2021-09-27 MED ORDER — CLONIDINE HCL 0.2 MG PO TABS
0.2000 mg | ORAL_TABLET | Freq: Two times a day (BID) | ORAL | Status: DC
  Administered 2021-09-27 – 2021-09-30 (×6): 0.2 mg via ORAL
  Filled 2021-09-27 (×6): qty 1

## 2021-09-27 MED ORDER — SODIUM CHLORIDE 0.9% FLUSH
3.0000 mL | INTRAVENOUS | Status: DC | PRN
  Administered 2021-09-27: 3 mL via INTRAVENOUS

## 2021-09-27 MED ORDER — SODIUM CHLORIDE 0.9 % IV SOLN
1.0000 g | Freq: Once | INTRAVENOUS | Status: AC
  Administered 2021-09-27: 1 g via INTRAVENOUS
  Filled 2021-09-27: qty 10

## 2021-09-27 MED ORDER — HEPARIN SODIUM (PORCINE) 5000 UNIT/ML IJ SOLN
5000.0000 [IU] | Freq: Three times a day (TID) | INTRAMUSCULAR | Status: DC
  Administered 2021-09-27 – 2021-09-30 (×8): 5000 [IU] via SUBCUTANEOUS
  Filled 2021-09-27 (×8): qty 1

## 2021-09-27 MED ORDER — CLOPIDOGREL BISULFATE 75 MG PO TABS
75.0000 mg | ORAL_TABLET | Freq: Every day | ORAL | Status: DC
  Administered 2021-09-27 – 2021-09-30 (×4): 75 mg via ORAL
  Filled 2021-09-27 (×4): qty 1

## 2021-09-27 NOTE — Progress Notes (Signed)
RT called by RN to place pt back on BIPAP. Pt was on NRB 15L when RT entered the room and appeared to be in respiratory distress. Pt has fine crackles throughout. Pt was placed back on BIPAP and appears to be more comfortable at this time. RT will monitor.

## 2021-09-27 NOTE — Assessment & Plan Note (Signed)
-   treat with Rocephin         await results of urine culture and adjust antibiotic coverage as needed  

## 2021-09-27 NOTE — Plan of Care (Signed)
°  Problem: Education: Goal: Ability to demonstrate management of disease process will improve Outcome: Progressing Goal: Ability to verbalize understanding of medication therapies will improve Outcome: Progressing Goal: Individualized Educational Video(s) Outcome: Progressing   Problem: Activity: Goal: Capacity to carry out activities will improve Outcome: Progressing   Problem: Cardiac: Goal: Ability to achieve and maintain adequate cardiopulmonary perfusion will improve Outcome: Progressing   Problem: Education: Goal: Ability to describe self-care measures that may prevent or decrease complications (Diabetes Survival Skills Education) will improve Outcome: Progressing Goal: Individualized Educational Video(s) Outcome: Progressing   Problem: Coping: Goal: Ability to adjust to condition or change in health will improve Outcome: Progressing   Problem: Fluid Volume: Goal: Ability to maintain a balanced intake and output will improve Outcome: Progressing   Problem: Health Behavior/Discharge Planning: Goal: Ability to identify and utilize available resources and services will improve Outcome: Progressing Goal: Ability to manage health-related needs will improve Outcome: Progressing   Problem: Metabolic: Goal: Ability to maintain appropriate glucose levels will improve Outcome: Progressing   Problem: Nutritional: Goal: Maintenance of adequate nutrition will improve Outcome: Progressing Goal: Progress toward achieving an optimal weight will improve Outcome: Progressing   Problem: Skin Integrity: Goal: Risk for impaired skin integrity will decrease Outcome: Progressing   Problem: Tissue Perfusion: Goal: Adequacy of tissue perfusion will improve Outcome: Progressing   Problem: Education: Goal: Knowledge of disease and its progression will improve Outcome: Progressing Goal: Individualized Educational Video(s) Outcome: Progressing   Problem: Fluid Volume: Goal:  Compliance with measures to maintain balanced fluid volume will improve Outcome: Progressing   Problem: Health Behavior/Discharge Planning: Goal: Ability to manage health-related needs will improve Outcome: Progressing   Problem: Nutritional: Goal: Ability to make healthy dietary choices will improve Outcome: Progressing   Problem: Clinical Measurements: Goal: Complications related to the disease process, condition or treatment will be avoided or minimized Outcome: Progressing

## 2021-09-27 NOTE — Consult Note (Addendum)
[facility] Renal Consultation Note  Requesting MD: Toy Baker Indication for Consultation: CKDV  HPI:  Austin [name] is a 87 y.o. male with history of HFpEF last echo 06/2021 with EF 55-60% and grade I DD, diet controled DM2, CKDV, prostate cancer presents for worsening dyspnea for the past 4 days. Noticed this while walking up the stairs and in the parking lot. Some orthopnea off and on. Reports taking torsemide 20 mg twice daily and additional dose if legs are swollen. Took extra in the past 2 days. Also reports abdominal bloating and constipation. Last BM yesterday. Otherwise states he has been feeling well up until he starting feeling short of breath.   Notes overnight he notes episodes of coughing after drinking water. Breathing feeling better this morning on Tea until he took oral medications today and started cough. Denies sputum. Wife reports occasional coughing with meals inconsistently. Was placed back on BiPAP and feeling much better.   Last seen in nephrology clinic on 1/9 with Dr. Royce Macadamia. Notes that he was told to take torsemide 40 mg daily and BID on days with worsening LE edema. Has had discussion as outpatient to start on PD if needed, but kidney function has been stable. He reports good urine out put at home, and increase urination with lasix in ED. Denies fever, chills, chest pain, nausea, vomiting, dysuria, changes in taste, fatigue, or confusion.  Creatinine  Date/Time Value Ref Range Status  12/02/2019 12:00 AM 2.9 (A) 0.6 - 1.3 Final  08/19/2019 11:07 AM 2.81 (H) 0.61 - 1.24 mg/dL Final   Creat  Date/Time Value Ref Range Status  03/03/2020 02:54 PM 3.59 (H) 0.70 - 1.11 mg/dL Final    Comment:    For patients >60 years of age, the reference limit for Creatinine is approximately 13% higher for people identified as African-American. .    Creatinine, Ser  Date/Time Value Ref Range Status  09/26/2021 09:17 PM 4.33 (H) 0.61 - 1.24 mg/dL Final   07/21/2021 02:03 PM 5.06 (HH) 0.40 - 1.50 mg/dL Final  07/14/2021 01:45 AM 4.77 (H) 0.61 - 1.24 mg/dL Final  07/13/2021 03:12 AM 4.81 (H) 0.61 - 1.24 mg/dL Final  07/12/2021 02:30 AM 4.86 (H) 0.61 - 1.24 mg/dL Final  07/11/2021 02:35 AM 4.77 (H) 0.61 - 1.24 mg/dL Final  07/10/2021 02:46 AM 4.52 (H) 0.61 - 1.24 mg/dL Final  07/09/2021 09:08 AM 4.04 (H) 0.61 - 1.24 mg/dL Final  07/08/2021 11:58 PM 4.02 (H) 0.61 - 1.24 mg/dL Final  06/11/2021 10:57 AM 3.70 (H) 0.40 - 1.50 mg/dL Final  05/27/2021 11:19 AM 4.07 (H) 0.40 - 1.50 mg/dL Final  10/12/2020 03:39 AM 4.78 (H) 0.61 - 1.24 mg/dL Final  10/11/2020 05:04 AM 4.64 (H) 0.61 - 1.24 mg/dL Final  10/10/2020 06:53 AM 5.01 (H) 0.61 - 1.24 mg/dL Final  10/09/2020 08:00 PM 5.38 (H) 0.61 - 1.24 mg/dL Final  10/09/2020 03:33 PM 5.86 (H) 0.61 - 1.24 mg/dL Final  10/09/2020 10:35 AM 6.24 (H) 0.61 - 1.24 mg/dL Final  09/23/2020 09:45 AM 5.63 (HH) 0.40 - 1.50 mg/dL Final  09/20/2020 02:38 AM 4.22 (H) 0.61 - 1.24 mg/dL Final  09/19/2020 05:01 AM 3.94 (H) 0.61 - 1.24 mg/dL Final  09/18/2020 05:27 AM 3.70 (H) 0.61 - 1.24 mg/dL Final  09/17/2020 01:59 AM 3.60 (H) 0.61 - 1.24 mg/dL Final  09/16/2020 07:59 AM 3.51 (H) 0.61 - 1.24 mg/dL Final  02/26/2020 07:24 AM 3.35 (H) 0.61 - 1.24 mg/dL Final  02/25/2020 03:21 AM 3.12 (H) 0.61 -  1.24 mg/dL Final  02/24/2020 03:39 AM 2.92 (H) 0.61 - 1.24 mg/dL Final  02/23/2020 03:59 PM 3.09 (H) 0.61 - 1.24 mg/dL Final  11/30/2019 08:00 PM 3.08 (H) 0.61 - 1.24 mg/dL Final  11/28/2019 05:29 AM 2.91 (H) 0.61 - 1.24 mg/dL Final  08/14/2019 10:28 AM 2.73 (H) 0.40 - 1.50 mg/dL Final  08/06/2019 05:43 AM 3.00 (H) 0.61 - 1.24 mg/dL Final  08/05/2019 05:36 AM 2.95 (H) 0.61 - 1.24 mg/dL Final  08/04/2019 05:08 AM 2.67 (H) 0.61 - 1.24 mg/dL Final  05/01/2019 09:53 AM 3.15 (H) 0.40 - 1.50 mg/dL Final  04/03/2019 01:51 PM 3.46 (H) 0.61 - 1.24 mg/dL Final  03/29/2019 11:51 AM 3.30 (H) 0.61 - 1.24 mg/dL Final  03/29/2019 04:07 AM  3.38 (H) 0.61 - 1.24 mg/dL Final  03/28/2019 07:26 PM 3.30 (H) 0.61 - 1.24 mg/dL Final  03/28/2019 05:40 AM 2.59 (H) 0.61 - 1.24 mg/dL Final  03/27/2019 08:30 PM 2.94 (H) 0.61 - 1.24 mg/dL Final  01/14/2019 01:15 PM 2.88 (H) 0.61 - 1.24 mg/dL Final  07/25/2018 11:24 AM 2.49 (H) 0.40 - 1.50 mg/dL Final  11/20/2017 10:27 AM 2.08 (H) 0.40 - 1.50 mg/dL Final  01/09/2017 10:16 AM 1.80 (H) 0.40 - 1.50 mg/dL Final  12/12/2016 01:39 PM 2.06 (H) 0.40 - 1.50 mg/dL Final  11/09/2016 02:26 PM 2.32 (H) 0.40 - 1.50 mg/dL Final  11/05/2016 06:12 AM 1.45 (H) 0.61 - 1.24 mg/dL Final  11/04/2016 12:41 AM 1.90 (H) 0.61 - 1.24 mg/dL Final  11/03/2016 09:15 AM 2.29 (H) 0.61 - 1.24 mg/dL Final  09/09/2016 12:13 PM 2.20 (H) 0.40 - 1.50 mg/dL Final  02/10/2016 10:49 AM 1.80 (H) 0.40 - 1.50 mg/dL Final  11/11/2015 10:39 AM 1.92 (H) 0.40 - 1.50 mg/dL Final     PMHx:   Past Medical History:  Diagnosis Date   Acute diastolic CHF (congestive heart failure) ([place]) 09/17/2020   Acute exacerbation of CHF (congestive heart failure) ([place]) 08/04/2019   ANEMIA DUE TO CHRONIC BLOOD LOSS 03/13/2007   CAROTID ARTERY STENOSIS 05/10/2010   CHF (congestive heart failure) ([place])    DIABETES MELLITUS, TYPE II 09/19/2007   DISEASE, CEREBROVASCULAR NEC 03/05/2007   GERD 03/13/2007   HYPERLIPIDEMIA 03/05/2007   HYPERTENSION 03/05/2007   HYPOKALEMIA 11/09/2009   KNEE PAIN, RIGHT 11/09/2009   PROSTATE CANCER, HX OF 03/05/2007   RENAL DISEASE, CHRONIC 02/03/2009    Past Surgical History:  Procedure Laterality Date   CAROTID ARTERY ANGIOPLASTY Right Oct. 10, 2001   ESOPHAGOGASTRODUODENOSCOPY (EGD) WITH PROPOFOL N/A 11/04/2016   Procedure: ESOPHAGOGASTRODUODENOSCOPY (EGD) WITH PROPOFOL;  Surgeon: Otis Brace, MD;  Location: [facility];  Service: Gastroenterology;  Laterality: N/A;   LAPAROSCOPIC APPENDECTOMY  02/20/2012   Procedure: APPENDECTOMY LAPAROSCOPIC;  Surgeon: Stark Klein, MD;  Location: [facility];  Service: General;   Laterality: N/A;   PROSTATE SURGERY     prostatectomy    Family Hx:  Family History  Problem Relation Age of Onset   Hypertension Mother    Cancer Father        Mesothelioma    Stomach cancer Brother    Cancer Brother    Cancer - Cervical Brother    Cancer Brother    Diabetes Brother    Esophageal cancer Neg Hx    Colon cancer Neg Hx    Pancreatic cancer Neg Hx     Social History:  reports that he quit smoking about 47 years ago. His smoking use included cigarettes. He has a 10.00 pack-year smoking history. He  has never used smokeless tobacco. He reports that he does not drink alcohol and does not use drugs.  Allergies:  Allergies  Allergen Reactions   Aspirin Other (See Comments)    High doses causes stomach ulcer and bleeding    Medications: Prior to Admission medications   Medication Sig Start Date End Date Taking? Authorizing Provider  ACCU-CHEK GUIDE test strip USED TO CHECK BLOOD GLUCOSE TWICE A DAY OR AS NEEDED Patient taking differently: in the morning and at bedtime. 10/20/20  Yes Nafziger, Tommi Rumps, NP  Accu-Chek Softclix Lancets lancets USED TO CHECK BLOOD GLUCOSE TWICE A DAY OR AS NEEDED Patient taking differently: in the morning and at bedtime. 12/25/20  Yes Nafziger, Tommi Rumps, NP  acetaminophen (TYLENOL) 325 MG tablet Take 325 mg by mouth 4 (four) times daily.   Yes [provider]  albuterol (VENTOLIN HFA) 108 (90 Base) MCG/ACT inhaler TAKE 2 PUFFS BY MOUTH EVERY 6 HOURS AS NEEDED FOR WHEEZE OR SHORTNESS OF BREATH Patient taking differently: Inhale 2 puffs into the lungs every 6 (six) hours as needed for shortness of breath or wheezing. 08/04/21  Yes Collene Gobble, MD  amLODipine (NORVASC) 10 MG tablet TAKE 1 TABLET BY MOUTH EVERY DAY Patient taking differently: Take 10 mg by mouth daily. 07/13/21  Yes Nafziger, Tommi Rumps, NP  atorvastatin (LIPITOR) 20 MG tablet TAKE 1 TABLET BY MOUTH EVERY DAY Patient taking differently: Take 20 mg by mouth daily. 12/29/20  Yes  Nafziger, Tommi Rumps, NP  bisacodyl (DULCOLAX) 10 MG suppository Place 1 suppository (10 mg total) rectally daily as needed for mild constipation. 07/13/21  Yes Geradine Girt, DO  Blood Glucose Monitoring Suppl (ACCU-CHEK AVIVA PLUS) w/Device KIT Used to check blood glucose 2 times a day or PRN Patient taking differently: in the morning and at bedtime. 09/04/19  Yes Nafziger, Tommi Rumps, NP  cholecalciferol (VITAMIN D3) 25 MCG (1000 UNIT) tablet Take 1,000 Units by mouth daily.   Yes [provider]  cloNIDine (CATAPRES) 0.3 MG tablet Take 1 tablet (0.3 mg total) by mouth 3 (three) times daily. 09/20/20  Yes Harold Hedge, MD  clopidogrel (PLAVIX) 75 MG tablet TAKE 1 TABLET BY MOUTH EVERY DAY Patient taking differently: Take 75 mg by mouth daily. 07/13/21  Yes Nafziger, Tommi Rumps, NP  doxazosin (CARDURA) 2 MG tablet Take 2 mg by mouth at bedtime.  12/28/17  Yes [provider]  esomeprazole (NEXIUM) 40 MG capsule TAKE 1 CAPSULE BY MOUTH EVERY DAY Patient taking differently: 40 mg daily. 05/13/21  Yes Nafziger, Tommi Rumps, NP  glipiZIDE (GLUCOTROL XL) 5 MG 24 hr tablet TAKE 1 TABLET BY MOUTH EVERY DAY Patient taking differently: Take 5 mg by mouth daily with breakfast. 09/16/21  Yes Nafziger, Tommi Rumps, NP  isosorbide-hydrALAZINE (BIDIL) 20-37.5 MG tablet Take 1 tablet by mouth 3 (three) times daily.   Yes [provider]  KLOR-CON M10 10 MEQ tablet Take 2 tablets (20 mEq total) by mouth 2 (two) times daily. Patient taking differently: Take 10 mEq by mouth 2 (two) times daily. 09/20/20 09/26/21 Yes Harold Hedge, MD  lactulose (Cataio) 10 GM/15ML solution TAKE 15 ML BY MOUTH 2 TIMES DAILY AS NEEDED FOR MILD CONSTIPATION. Patient taking differently: Take 10 g by mouth See admin instructions. Every 2-3 days, alternating with Citrucel 03/18/21  Yes Nafziger, Tommi Rumps, NP  methylcellulose (CITRUCEL) oral powder Take 1 packet by mouth See admin instructions. Every 2-3 days,alternating with lactulose   Yes  [provider]  metoprolol succinate (TOPROL-XL) 100 MG 24 hr  tablet Take 1 tablet (100 mg total) by mouth daily. Take with or immediately following a meal. 09/21/20 09/26/21 Yes Harold Hedge, MD  Propylene Glycol (SYSTANE BALANCE OP) Place 1 drop into both eyes at bedtime.   Yes [provider]  torsemide (DEMADEX) 20 MG tablet Take 20 mg by mouth 2 (two) times daily.   Yes [provider]  triamcinolone cream (KENALOG) 0.1 % Apply 1 application topically daily as needed (dry/irritated skin). 07/05/19  Yes [provider]  hydrocortisone (ANUSOL-HC) 2.5 % rectal cream PLACE 1 APPLICATION RECTALLY 2 TIMES A DAY. Patient taking differently: Place 1 application rectally 2 (two) times daily. 03/31/21   Burchette, Alinda Sierras, MD  polyethylene glycol (MIRALAX / GLYCOLAX) 17 g packet Take 17 g by mouth daily as needed for mild constipation. Patient not taking: Reported on 09/26/2021    [provider]  torsemide 40 MG TABS Take 40 mg by mouth daily. Patient not taking: Reported on 09/26/2021 07/14/21   Geradine Girt, DO    Prior to Admission: (Not in a hospital admission)   Labs:  Results for orders placed or performed during the hospital encounter of 09/26/21 (from the past 48 hour(s))  Basic metabolic panel     Status: Abnormal   Collection Time: 09/26/21  9:17 PM  Result Value Ref Range   Sodium 144 135 - 145 mmol/L   Potassium 3.7 3.5 - 5.1 mmol/L   Chloride 113 (H) 98 - 111 mmol/L   CO2 18 (L) 22 - 32 mmol/L   Glucose, Bld 181 (H) 70 - 99 mg/dL    Comment: Glucose reference range applies only to samples taken after fasting for at least 8 hours.   BUN 66 (H) 8 - 23 mg/dL   Creatinine, Ser 4.33 (H) 0.61 - 1.24 mg/dL   Calcium 9.1 8.9 - 10.3 mg/dL   GFR, Estimated 13 (L) >60 mL/min    Comment: (NOTE) Calculated using the CKD-EPI Creatinine Equation (2021)    Anion gap 13 5 - 15    Comment: Performed at [facility] [address],  [place], [place] [address]  Magnesium     Status: None   Collection Time: 09/26/21  9:17 PM  Result Value Ref Range   Magnesium 2.3 1.7 - 2.4 mg/dL    Comment: Performed at [facility], [place] [address], [place], [place] [address]  Brain natriuretic peptide (order ONLY if patient c/o SOB)     Status: Abnormal   Collection Time: 09/26/21  9:17 PM  Result Value Ref Range   B Natriuretic Peptide 3,670.5 (H) 0.0 - 100.0 pg/mL    Comment: Performed at [facility] [address], [place], [place] [address]  CBC     Status: Abnormal   Collection Time: 09/26/21  9:17 PM  Result Value Ref Range   WBC 10.7 (H) 4.0 - 10.5 K/uL   RBC 3.67 (L) 4.22 - 5.81 MIL/uL   Hemoglobin 9.9 (L) 13.0 - 17.0 g/dL   HCT 31.6 (L) 39.0 - 52.0 %   MCV 86.1 80.0 - 100.0 fL   MCH 27.0 26.0 - 34.0 pg   MCHC 31.3 30.0 - 36.0 g/dL   RDW 17.0 (H) 11.5 - 15.5 %   Platelets 280 150 - 400 K/uL   nRBC 0.0 0.0 - 0.2 %    Comment: Performed at [facility] [address], [place], [place] [address]  Urinalysis, Routine w reflex microscopic  Status: Abnormal   Collection Time: 09/26/21  9:47 PM  Result Value Ref Range   Color, Urine YELLOW YELLOW   APPearance CLOUDY (A) CLEAR   Specific Gravity, Urine 1.020 1.005 - 1.030   pH 6.0 5.0 - 8.0   Glucose, UA NEGATIVE NEGATIVE mg/dL   Hgb urine dipstick SMALL (A) NEGATIVE   Bilirubin Urine NEGATIVE NEGATIVE   Ketones, ur NEGATIVE NEGATIVE mg/dL   Protein, ur >300 (A) NEGATIVE mg/dL   Nitrite POSITIVE (A) NEGATIVE   Leukocytes,Ua NEGATIVE NEGATIVE    Comment: Performed at [facility] [address] [place], [place] [address]  Urinalysis, Microscopic (reflex)     Status: Abnormal   Collection Time: 09/26/21  9:47 PM  Result Value Ref Range   RBC / HPF 0-5 0 - 5 RBC/hpf   WBC, UA 11-20 0 - 5 WBC/hpf   Bacteria, UA MANY (A) NONE SEEN   Squamous Epithelial / LPF 0-5 0 - 5   Mucus PRESENT     Comment: Performed at [facility], [place]  [address], [place], [place] [address]  CBG monitoring, ED     Status: Abnormal   Collection Time: 09/27/21 12:12 AM  Result Value Ref Range   Glucose-Capillary 124 (H) 70 - 99 mg/dL    Comment: Glucose reference range applies only to samples taken after fasting for at least 8 hours.  Troponin I (High Sensitivity)     Status: Abnormal   Collection Time: 09/27/21  1:19 AM  Result Value Ref Range   Troponin I (High Sensitivity) 137 (HH) <18 ng/L    Comment: CRITICAL RESULT CALLED TO, READ BACK BY AND VERIFIED WITH: CHRIS CHRISCO RN 09/27/21 0224 M KOROLESKI (NOTE) Elevated high sensitivity troponin I (hsTnI) values and significant  changes across serial measurements may suggest ACS but many other  chronic and acute conditions are known to elevate hsTnI results.  Refer to the Links section for chest pain algorithms and additional  guidance. Performed at [facility], [place] [address], [place], [place] [address]   Vitamin B12     Status: Abnormal   Collection Time: 09/27/21  1:19 AM  Result Value Ref Range   Vitamin B-12 158 (L) 180 - 914 pg/mL    Comment: (NOTE) This assay is not validated for testing neonatal or myeloproliferative syndrome specimens for Vitamin B12 levels. Performed at [facility], [place] [address] [place], [place] [address]   Folate     Status: None   Collection Time: 09/27/21  1:19 AM  Result Value Ref Range   Folate 19.9 >5.9 ng/mL    Comment: Performed at [facility] [address] [place], [place] [address]  Iron and TIBC     Status: Abnormal   Collection Time: 09/27/21  1:19 AM  Result Value Ref Range   Iron 45 45 - 182 ug/dL   TIBC 239 (L) 250 - 450 ug/dL   Saturation Ratios 19 17.9 - 39.5 %   UIBC 194 ug/dL    Comment: Performed at [facility], [place] [address], [place], [place] [address]  Ferritin     Status: None   Collection Time: 09/27/21  1:19 AM  Result Value Ref Range   Ferritin 59 24 - 336 ng/mL    Comment: Performed at  [facility], [place] [address] [place], [place] [address]  Reticulocytes     Status: Abnormal   Collection Time: 09/27/21  1:19 AM  Result Value Ref Range   Retic Ct Pct 0.9 0.4 - 3.1 %   RBC. 3.31 (L) 4.22 - 5.81 MIL/uL   Retic Count, Absolute 29.8 19.0 - 186.0 K/uL   Immature Retic Fract 12.6 2.3 - 15.9 %    Comment: Performed at [facility] [address], [place], [place] [address]  Hemoglobin A1c     Status: Abnormal   Collection Time: 09/27/21  1:19 AM  Result Value Ref Range   Hgb A1c MFr Bld 6.1 (H) 4.8 - 5.6 %    Comment: (NOTE) Pre diabetes:          5.7%-6.4%  Diabetes:              >6.4%  Glycemic control for   <7.0% adults with diabetes    Mean Plasma Glucose 128.37 mg/dL    Comment: Performed at [facility] [address], [place], [place] [address]  Differential     Status: Abnormal   Collection Time: 09/27/21  1:19 AM  Result Value Ref Range   Neutrophils Relative % 89 %   Neutro Abs 8.2 (H) 1.7 - 7.7 K/uL   Lymphocytes Relative 5 %   Lymphs Abs 0.4 (L) 0.7 - 4.0 K/uL   Monocytes Relative 5 %   Monocytes Absolute 0.4 0.1 - 1.0 K/uL   Eosinophils Relative 0 %   Eosinophils Absolute 0.0 0.0 - 0.5 K/uL   Basophils Relative 0 %   Basophils Absolute 0.0 0.0 - 0.1 K/uL   Immature Granulocytes 1 %   Abs Immature Granulocytes 0.04 0.00 - 0.07 K/uL    Comment: Performed at [facility] [address], [place], [place] [address]  Hepatic function panel     Status: Abnormal   Collection Time: 09/27/21  1:19 AM  Result Value Ref Range   Total Protein 7.1 6.5 - 8.1 g/dL   Albumin 3.4 (L) 3.5 - 5.0 g/dL   AST 13 (L) 15 - 41 U/L   ALT 13 0 - 44 U/L   Alkaline Phosphatase 81 38 - 126 U/L   Total Bilirubin 0.4 0.3 - 1.2 mg/dL   Bilirubin, Direct <0.1 0.0 - 0.2 mg/dL   Indirect Bilirubin NOT CALCULATED 0.3 - 0.9 mg/dL    Comment: Performed at [facility] [address] [place], [place] [address]  Phosphorus     Status: None    Collection Time: 09/27/21  1:19 AM  Result Value Ref Range   Phosphorus 4.1 2.5 - 4.6 mg/dL    Comment: Performed at [facility] [address] [place], [place] [address]  TSH     Status: None   Collection Time: 09/27/21  1:19 AM  Result Value Ref Range   TSH 0.620 0.350 - 4.500 uIU/mL    Comment: Performed by a 3rd Generation assay with a functional sensitivity of <=0.01 uIU/mL. Performed at [facility], [place] [address], [place], [place] [address]   I-Stat venous blood gas, ED     Status: Abnormal   Collection Time: 09/27/21  1:29 AM  Result Value Ref Range   pH, Ven 7.450 (H) 7.250 - 7.430   pCO2, Ven 30.0 (L) 44.0 - 60.0 mmHg   pO2, Ven 126.0 (H) 32.0 - 45.0 mmHg   Bicarbonate 20.8 20.0 - 28.0 mmol/L   TCO2 22 22 - 32 mmol/L   O2 Saturation 99.0 %   Acid-base deficit 3.0 (H) 0.0 - 2.0 mmol/L  Sodium 148 (H) 135 - 145 mmol/L   Potassium 3.7 3.5 - 5.1 mmol/L   Calcium, Ion 1.15 1.15 - 1.40 mmol/L   HCT 27.0 (L) 39.0 - 52.0 %   Hemoglobin 9.2 (L) 13.0 - 17.0 g/dL   Sample type VENOUS   CBG monitoring, ED     Status: Abnormal   Collection Time: 09/27/21  4:38 AM  Result Value Ref Range   Glucose-Capillary 108 (H) 70 - 99 mg/dL    Comment: Glucose reference range applies only to samples taken after fasting for at least 8 hours.  Resp Panel by RT-PCR (Flu A&B, Covid) Nasopharyngeal Swab     Status: None   Collection Time: 09/27/21  4:56 AM   Specimen: Nasopharyngeal Swab; Nasopharyngeal(NP) swabs in vial transport medium  Result Value Ref Range   SARS Coronavirus 2 by RT PCR NEGATIVE NEGATIVE    Comment: (NOTE) SARS-CoV-2 target nucleic acids are NOT DETECTED.  The SARS-CoV-2 RNA is generally detectable in upper respiratory specimens during the acute phase of infection. The lowest concentration of SARS-CoV-2 viral copies this assay can detect is 138 copies/mL. A negative result does not preclude SARS-Cov-2 infection and should not be used as the sole basis for  treatment or other patient management decisions. A negative result may occur with  improper specimen collection/handling, submission of specimen other than nasopharyngeal swab, presence of viral mutation(s) within the areas targeted by this assay, and inadequate number of viral copies(<138 copies/mL). A negative result must be combined with clinical observations, patient history, and epidemiological information. The expected result is Negative.  Fact Sheet for Patients:  EntrepreneurPulse.com.au  Fact Sheet for Healthcare Providers:  IncredibleEmployment.be  This test is no t yet approved or cleared by the [place] FDA and  has been authorized for detection and/or diagnosis of SARS-CoV-2 by FDA under an Emergency Use Authorization (EUA). This EUA will remain  in effect (meaning this test can be used) for the duration of the COVID-19 declaration under Section 564(b)(1) of the Act, 21 U.S.C.section 360bbb-3(b)(1), unless the authorization is terminated  or revoked sooner.       Influenza A by PCR NEGATIVE NEGATIVE   Influenza B by PCR NEGATIVE NEGATIVE    Comment: (NOTE) The Xpert Xpress SARS-CoV-2/FLU/RSV plus assay is intended as an aid in the diagnosis of influenza from Nasopharyngeal swab specimens and should not be used as a sole basis for treatment. Nasal washings and aspirates are unacceptable for Xpert Xpress SARS-CoV-2/FLU/RSV testing.  Fact Sheet for Patients: EntrepreneurPulse.com.au  Fact Sheet for Healthcare Providers: IncredibleEmployment.be  This test is not yet approved or cleared by the [place] FDA and has been authorized for detection and/or diagnosis of SARS-CoV-2 by FDA under an Emergency Use Authorization (EUA). This EUA will remain in effect (meaning this test can be used) for the duration of the COVID-19 declaration under Section 564(b)(1) of the Act, 21 U.S.C. section  360bbb-3(b)(1), unless the authorization is terminated or revoked.  Performed at [facility], [place] [address], [place], [place] [address]   CBC WITH DIFFERENTIAL     Status: Abnormal   Collection Time: 09/27/21  8:00 AM  Result Value Ref Range   WBC 11.5 (H) 4.0 - 10.5 K/uL   RBC 3.55 (L) 4.22 - 5.81 MIL/uL   Hemoglobin 9.5 (L) 13.0 - 17.0 g/dL   HCT 30.8 (L) 39.0 - 52.0 %   MCV 86.8 80.0 - 100.0 fL   MCH 26.8 26.0 - 34.0 pg   MCHC  30.8 30.0 - 36.0 g/dL   RDW 17.0 (H) 11.5 - 15.5 %   Platelets 244 150 - 400 K/uL   nRBC 0.0 0.0 - 0.2 %   Neutrophils Relative % 90 %   Neutro Abs 10.3 (H) 1.7 - 7.7 K/uL   Lymphocytes Relative 4 %   Lymphs Abs 0.4 (L) 0.7 - 4.0 K/uL   Monocytes Relative 6 %   Monocytes Absolute 0.7 0.1 - 1.0 K/uL   Eosinophils Relative 0 %   Eosinophils Absolute 0.0 0.0 - 0.5 K/uL   Basophils Relative 0 %   Basophils Absolute 0.0 0.0 - 0.1 K/uL   Immature Granulocytes 0 %   Abs Immature Granulocytes 0.05 0.00 - 0.07 K/uL    Comment: Performed at [facility] [address], [place], [place] [address]     ROS:  Pertinent items are noted in HPI.  Physical Exam: Vitals:   09/27/21 0806 09/27/21 0815  BP:  (!) 190/80  Pulse: 85 82  Resp: (!) 22 (!) 22  Temp:    SpO2: (!) 89% 92%     General:alert, pleasant, sitting up in bed on BiPAP HEENT: normocephalic, atraumatic, BiPAP Eyes:EOMI, conjunctiva normal Neck: No obvious JVD difficult to appreciate with BIPAP and positioning Heart: normal rate, regular no murmur, gallops, or rubs.  Lungs: Crackles bilateral lungs diffusely, on BiPAP, no accessory muscle use Abdomen: distended, active BS, no tenderness  Extremities: 2+ bilaterally pitting edema to mid thighs Skin: dry flaky of bilateral shins, no erythema Neuro: aoX4   Assessment/Plan: 1.CKDV, stable sCR of 4.5 today, baseline around 4 - no indications to start on PD but preference is for PD when/if RRT needed - lasix as below   2.  HFpEF exacerbation- Hypervolemic on exam. Around 144m output with IV Lasix 60 overnight. Currently on BiPAP. Lasix 120 IV given this morning with about 5096moutput. Will give additional Lasix 806mV this evening and 80 mg IV in the am. Monitor volume status, urine output, and electrolytes. Echo pending. Has been taking less torsemide at home 20 mg twice daily (as opposed to 40 mg daily), likely need torsemide 40 mg BID when he goes home  3. Anemia  - stable hgb 9.5, no signs of bleeding, iron studies c/w anemia of chronic inflammation on EPO due to history of lung nodule   4. Hypokalemia- K 3.4 this morning, repleted overnight, continue to monitor  5. Hypertension- amlodipine held for LE edema, on bidil and metoprolol succinate at home, ARB stopped at last admission for AKI with advance renal disease,   6. Diabetes- on glipizide at home. A1c 6.1. SSI  7. Pulmonary nodule- Follows pulm. Next Ct in April 2023   JesIona BeardD IM PGY-2 09/27/2021, 8:25 AM    Seen and examined independently.  Agree with note and exam as documented above by physician extender and as noted here.  Patient known to me from clinic with CKD stage V, chronic diastolic CHF who presented to the hospital with shortness of breath.  He has been taking torsemide 20 mg BID - last rx in my note is for 40 mg daily.  He has had a good response to IV lasix this am per his wife.  Seen on bipap and feels better.   General adult male in bed in no acute distress on CPAP HEENT normocephalic atraumatic extraocular movements intact sclera anicteric Neck supple trachea midline Lungs reduced breath sounds; on CPAP normal work of breathing at rest  Heart  S1S2 no rub Abdomen soft nontender nondistended Extremities 1+ edema lower extremities Psych normal mood and affect Neuro - alert and oriented x 3 provides hx and follows commands   # CKD stage V - no emergent indication for dialysis - preference is for PD when/if RRT needed  and he is an excellent candidate  # Acute on chronic diastolic CHF  - IV diuretics for now and hopeful for management with diuretics - lasix 80 mg IV this afternoon and tomorrow am (ordered) - will need torsemide 40 mg BID on discharge  # Anemia CKD - defer ESA in setting of lung nodule for now  # HTN  - optimize volume with lasix    # Hypokalemia - give additional 20 meq potassium po  # metabolic bone disease - phos ok  # Lung lesion - follows with pulm  Claudia Desanctis, MD 09/27/2021  2:00 PM

## 2021-09-27 NOTE — ED Notes (Signed)
Breakfast Orders Placed °

## 2021-09-27 NOTE — Plan of Care (Signed)
°  Problem: Coping: Goal: Ability to adjust to condition or change in health will improve Outcome: Progressing   Problem: Metabolic: Goal: Ability to maintain appropriate glucose levels will improve Outcome: Progressing   Problem: Skin Integrity: Goal: Risk for impaired skin integrity will decrease Outcome: Progressing   Problem: Fluid Volume: Goal: Compliance with measures to maintain balanced fluid volume will improve Outcome: Progressing

## 2021-09-27 NOTE — ED Notes (Signed)
Provider at bedside

## 2021-09-27 NOTE — Progress Notes (Addendum)
PROGRESS NOTE    Austin [name]  JGO:[id number] DOB: 1935/07/14 DOA: 09/26/2021 PCP: Dorothyann Peng, NP  Brief Narrative: 87/M with history of CKD 5, chronic diastolic CHF, type 2 diabetes mellitus, history of prostate cancer presented to the ED with worsening dyspnea on exertion, abdominal and leg swelling X 4 days.  Followed by nephrology, on torsemide daily, reports compliance with meds In the ED hypoxic to low 80s requiring 6 L, subsequently became tachypneic and placed on BiPAP, chest x-ray noted interstitial edema and cardiomegaly  Subjective: -Feels a little better compared to last night, filled the whole canister of urine according to wife  Assessment and Plan:  Acute on chronic diastolic CHF Progressive CKD 5 Pulmonary edema -Required BiPAP on admission, now off -Last echo 11/22 with EF 55-60% and grade 1 DD -Urine output overnight not charted, increase Lasix to 120 mg twice daily -Nephrology consulting -Unclear if he is a dialysis candidate, it appears PD was being considered at some point -Follow-up repeat echo -Monitor I's/O, daily weights -Ambulate, PT eval  Chronic anemia -Iron studies consistent with chronic disease  UTI -Continue ceftriaxone, follow-up urine cultures  Type 2 diabetes mellitus -Glipizide on hold, A1c is 6.1  Pulmonary nodule -Needs follow-up CT  Hypertension Uncontrolled -Restart amlodipine, BiDil and Toprol -Diuretics as noted above, taper down clonidine  History of prostate cancer -Monitor for urinary retention  Other meds: Plavix, reason unclear  DVT prophylaxis: Add heparin subcu Code Status: Full code Family Communication: Wife at bedside Disposition Plan:   Consultants:  Nephrology  Procedures:   Antimicrobials:    Objective: Vitals:   09/27/21 1000 09/27/21 1015 09/27/21 1219 09/27/21 1230  BP: (!) 172/74 (!) 163/65 (!) 178/74 (!) 181/77  Pulse: 74 70  85  Resp: 15 15  (!) 22  Temp:      TempSrc:      SpO2: 97% 98%   93%  Weight:      Height:        Intake/Output Summary (Last 24 hours) at 09/27/2021 1242 Last data filed at 09/27/2021 0935 Gross per 24 hour  Intake --  Output 500 ml  Net -500 ml   Filed Weights   09/26/21 2055  Weight: 81.2 kg    Examination:  General exam: Chronically ill elderly male sitting up in bed, AAOx3, mild respiratory distress Respiratory system: Fine bilateral rales Cardiovascular system: S1 & S2 heard, RRR.  Abd: nondistended, soft and nontender.Normal bowel sounds heard. Central nervous system: Alert and oriented. No focal neurological deficits. Extremities: 1+ edema Skin: No rashes Psychiatry:  Mood & affect appropriate.     Data Reviewed:   CBC: Recent Labs  Lab 09/26/21 2117 09/27/21 0119 09/27/21 0129 09/27/21 0800  WBC 10.7*  --   --  11.5*  NEUTROABS  --  8.2*  --  10.3*  HGB 9.9*  --  9.2* 9.5*  HCT 31.6*  --  27.0* 30.8*  MCV 86.1  --   --  86.8  PLT 280  --   --  559   Basic Metabolic Panel: Recent Labs  Lab 09/26/21 2117 09/27/21 0119 09/27/21 0129 09/27/21 0800  NA 144  --  148* 147*  K 3.7  --  3.7 3.4*  CL 113*  --   --  111  CO2 18*  --   --  22  GLUCOSE 181*  --   --  122*  BUN 66*  --   --  64*  CREATININE 4.33*  --   --  4.52*  CALCIUM 9.1  --   --  8.9  MG 2.3  --   --  2.4  PHOS  --  4.1  --  4.2   GFR: Estimated Creatinine Clearance: 12.2 mL/min (A) (by C-G formula based on SCr of 4.52 mg/dL (H)). Liver Function Tests: Recent Labs  Lab 09/27/21 0119 09/27/21 0800  AST 13* 12*  ALT 13 13  ALKPHOS 81 84  BILITOT 0.4 0.6  PROT 7.1 6.8  ALBUMIN 3.4* 3.6   No results for input(s): LIPASE, AMYLASE in the last 168 hours. No results for input(s): AMMONIA in the last 168 hours. Coagulation Profile: No results for input(s): INR, PROTIME in the last 168 hours. Cardiac Enzymes: No results for input(s): CKTOTAL, CKMB, CKMBINDEX, TROPONINI in the last 168 hours. BNP (last 3 results) Recent Labs    06/11/21 1057   PROBNP 1,890.0*   HbA1C: Recent Labs    09/27/21 0119  HGBA1C 6.1*   CBG: Recent Labs  Lab 09/27/21 0012 09/27/21 0438 09/27/21 0841 09/27/21 1235  GLUCAP 124* 108* 119* 72   Lipid Profile: No results for input(s): CHOL, HDL, LDLCALC, TRIG, CHOLHDL, LDLDIRECT in the last 72 hours. Thyroid Function Tests: Recent Labs    09/27/21 0800  TSH 0.545   Anemia Panel: Recent Labs    09/27/21 0119  VITAMINB12 158*  FOLATE 19.9  FERRITIN 59  TIBC 239*  IRON 45  RETICCTPCT 0.9   Urine analysis:    Component Value Date/Time   COLORURINE YELLOW 09/26/2021 2147   APPEARANCEUR CLOUDY (A) 09/26/2021 2147   LABSPEC 1.020 09/26/2021 2147   PHURINE 6.0 09/26/2021 2147   GLUCOSEU NEGATIVE 09/26/2021 2147   HGBUR SMALL (A) 09/26/2021 2147   HGBUR negative 09/19/2007 0000   BILIRUBINUR NEGATIVE 09/26/2021 2147   BILIRUBINUR n 11/18/2014 1003   KETONESUR NEGATIVE 09/26/2021 2147   PROTEINUR >300 (A) 09/26/2021 2147   UROBILINOGEN 0.2 11/18/2014 1003   UROBILINOGEN 1.0 01/26/2013 2059   NITRITE POSITIVE (A) 09/26/2021 2147   LEUKOCYTESUR NEGATIVE 09/26/2021 2147   Sepsis Labs: @LABRCNTIP (procalcitonin:4,lacticidven:4)  ) Recent Results (from the past 240 hour(s))  Resp Panel by RT-PCR (Flu A&B, Covid) Nasopharyngeal Swab     Status: None   Collection Time: 09/27/21  4:56 AM   Specimen: Nasopharyngeal Swab; Nasopharyngeal(NP) swabs in vial transport medium  Result Value Ref Range Status   SARS Coronavirus 2 by RT PCR NEGATIVE NEGATIVE Final    Comment: (NOTE) SARS-CoV-2 target nucleic acids are NOT DETECTED.  The SARS-CoV-2 RNA is generally detectable in upper respiratory specimens during the acute phase of infection. The lowest concentration of SARS-CoV-2 viral copies this assay can detect is 138 copies/mL. A negative result does not preclude SARS-Cov-2 infection and should not be used as the sole basis for treatment or other patient management decisions. A negative  result may occur with  improper specimen collection/handling, submission of specimen other than nasopharyngeal swab, presence of viral mutation(s) within the areas targeted by this assay, and inadequate number of viral copies(<138 copies/mL). A negative result must be combined with clinical observations, patient history, and epidemiological information. The expected result is Negative.  Fact Sheet for Patients:  EntrepreneurPulse.com.au  Fact Sheet for Healthcare Providers:  IncredibleEmployment.be  This test is no t yet approved or cleared by the [place] FDA and  has been authorized for detection and/or diagnosis of SARS-CoV-2 by FDA under an Emergency Use Authorization (EUA). This EUA will remain  in effect (meaning this test can be used)  for the duration of the COVID-19 declaration under Section 564(b)(1) of the Act, 21 U.S.C.section 360bbb-3(b)(1), unless the authorization is terminated  or revoked sooner.       Influenza A by PCR NEGATIVE NEGATIVE Final   Influenza B by PCR NEGATIVE NEGATIVE Final    Comment: (NOTE) The Xpert Xpress SARS-CoV-2/FLU/RSV plus assay is intended as an aid in the diagnosis of influenza from Nasopharyngeal swab specimens and should not be used as a sole basis for treatment. Nasal washings and aspirates are unacceptable for Xpert Xpress SARS-CoV-2/FLU/RSV testing.  Fact Sheet for Patients: EntrepreneurPulse.com.au  Fact Sheet for Healthcare Providers: IncredibleEmployment.be  This test is not yet approved or cleared by the [place] FDA and has been authorized for detection and/or diagnosis of SARS-CoV-2 by FDA under an Emergency Use Authorization (EUA). This EUA will remain in effect (meaning this test can be used) for the duration of the COVID-19 declaration under Section 564(b)(1) of the Act, 21 U.S.C. section 360bbb-3(b)(1), unless the authorization is  terminated or revoked.  Performed at [facility], [place] [address] [place], [place] [address]      Radiology Studies: DG Chest Portable 1 View  Result Date: 09/26/2021 CLINICAL DATA:  Shortness of breath, difficulty breathing EXAM: PORTABLE CHEST 1 VIEW COMPARISON:  07/09/2021 FINDINGS: Cardiomegaly with vascular congestion and early interstitial edema. No effusions or acute bony abnormality. IMPRESSION: Cardiomegaly, vascular congestion, early interstitial edema. Electronically Signed   By: Rolm Baptise M.D.   On: 09/26/2021 21:28     Scheduled Meds:  atorvastatin  20 mg Oral Daily   clopidogrel  75 mg Oral Daily   furosemide  80 mg Intravenous Once   [START ON 09/28/2021] furosemide  80 mg Intravenous Once   insulin aspart  0-9 Units Subcutaneous Q4H   sodium chloride flush  3 mL Intravenous Q12H   Continuous Infusions:  sodium chloride     [START ON 09/28/2021] cefTRIAXone (ROCEPHIN)  IV       LOS: 1 day    Time spent: 66min  Domenic Polite, MD [facility]   09/27/2021, 12:42 PM

## 2021-09-27 NOTE — ED Notes (Signed)
RT notified of pt needing to be placed on Bi-Pap

## 2021-09-27 NOTE — ED Notes (Signed)
PT O2 STAT WAS DECLINING AND I MADE CHARGE AND ASSIGNED NURSE AWARE AND A NONREBREATHER WAS PLACED ON PT UNTIL RESPIRATORY COULD ARRIVE TO REAPPLY BIPAP

## 2021-09-27 NOTE — ED Notes (Signed)
PT WAS STRUGGLING TO BREATHE AND O2 STATS WERE DECLINING SO PT WAS PLACED BACK ON A NONREBREATHER AND O2 STAT IMPROVED TO 93-95%

## 2021-09-27 NOTE — TOC Progression Note (Signed)
Transition of Care [facility]) - Progression Note    Patient Details  Name: Bailey Kolbe MRN: [id number] Date of Birth: 1935-01-29  [facility]) CM/SW Contact  Zenon Mayo, RN Phone Number: 09/27/2021, 4:55 PM  Clinical Narrative:     Transition of Care [facility]) Screening Note   Patient Details  Name: Nameer Summer Date of Birth: 09/27/1934   Transition of Care [facility]) CM/SW Contact:    Zenon Mayo, RN Phone Number: 09/27/2021, 4:55 PM    From home with resp failure and UTI, TOC  will continue to monitor patient advancement through interdisciplinary progression rounds. If new patient transition needs arise, please place a TOC consult.          Expected Discharge Plan and Services                                                 Social Determinants of Health (SDOH) Interventions    Readmission Risk Interventions Readmission Risk Prevention Plan 07/13/2021  Transportation Screening Complete  PCP or Specialist Appt within 3-5 Days Complete  HRI or [place] Complete  Social Work Consult for [place] Planning/Counseling Complete  Palliative Care Screening Not Applicable  Medication Review [employer]) Complete  Some recent data might be hidden

## 2021-09-27 NOTE — Progress Notes (Signed)
PT Cancellation Note  Patient Details Name: Austin [name] MRN: [id number] DOB: 1935/01/27   Cancelled Treatment:    Reason Eval/Treat Not Completed: Medical issues which prohibited therapy.  Pt has elevation of troponin and due to his sats dropping has been placed on Bipap.  Retry when pt is medically more able to be seen.   Ramond Dial 09/27/2021, 10:49 AM  Mee Hives, PT PhD Acute Rehab Dept. Number: [place] and [place]

## 2021-09-28 ENCOUNTER — Institutional Professional Consult (permissible substitution): Payer: Medicare PPO | Admitting: Pulmonary Disease

## 2021-09-28 ENCOUNTER — Inpatient Hospital Stay (HOSPITAL_COMMUNITY): Payer: Medicare PPO

## 2021-09-28 ENCOUNTER — Encounter (HOSPITAL_COMMUNITY): Payer: Self-pay | Admitting: Internal Medicine

## 2021-09-28 DIAGNOSIS — I509 Heart failure, unspecified: Secondary | ICD-10-CM | POA: Diagnosis not present

## 2021-09-28 DIAGNOSIS — I5031 Acute diastolic (congestive) heart failure: Secondary | ICD-10-CM | POA: Diagnosis not present

## 2021-09-28 DIAGNOSIS — R0602 Shortness of breath: Secondary | ICD-10-CM | POA: Diagnosis not present

## 2021-09-28 LAB — COMPREHENSIVE METABOLIC PANEL
ALT: 10 U/L (ref 0–44)
AST: 14 U/L — ABNORMAL LOW (ref 15–41)
Albumin: 3.6 g/dL (ref 3.5–5.0)
Alkaline Phosphatase: 77 U/L (ref 38–126)
Anion gap: 12 (ref 5–15)
BUN: 71 mg/dL — ABNORMAL HIGH (ref 8–23)
CO2: 21 mmol/L — ABNORMAL LOW (ref 22–32)
Calcium: 8.9 mg/dL (ref 8.9–10.3)
Chloride: 114 mmol/L — ABNORMAL HIGH (ref 98–111)
Creatinine, Ser: 4.33 mg/dL — ABNORMAL HIGH (ref 0.61–1.24)
GFR, Estimated: 13 mL/min — ABNORMAL LOW (ref >60)
Glucose, Bld: 103 mg/dL — ABNORMAL HIGH (ref 70–99)
Potassium: 3.4 mmol/L — ABNORMAL LOW (ref 3.5–5.1)
Sodium: 147 mmol/L — ABNORMAL HIGH (ref 135–145)
Total Bilirubin: 0.4 mg/dL (ref 0.3–1.2)
Total Protein: 6.6 g/dL (ref 6.5–8.1)

## 2021-09-28 LAB — ECHOCARDIOGRAM COMPLETE
AV Vena cont: 0.3 cm
Area-P 1/2: 4.52 cm2
Calc EF: 51.4 %
Height: 67 in
P 1/2 time: 325 msec
Radius: 0.2 cm
S' Lateral: 4 cm
Single Plane A2C EF: 50.7 %
Single Plane A4C EF: 53.7 %
Weight: 2649.05 oz

## 2021-09-28 LAB — CBC
HCT: 28.7 % — ABNORMAL LOW (ref 39.0–52.0)
Hemoglobin: 9.2 g/dL — ABNORMAL LOW (ref 13.0–17.0)
MCH: 27 pg (ref 26.0–34.0)
MCHC: 32.1 g/dL (ref 30.0–36.0)
MCV: 84.2 fL (ref 80.0–100.0)
Platelets: 226 10*3/uL (ref 150–400)
RBC: 3.41 MIL/uL — ABNORMAL LOW (ref 4.22–5.81)
RDW: 16.5 % — ABNORMAL HIGH (ref 11.5–15.5)
WBC: 9.3 10*3/uL (ref 4.0–10.5)
nRBC: 0 % (ref 0.0–0.2)

## 2021-09-28 LAB — GLUCOSE, CAPILLARY
Glucose-Capillary: 110 mg/dL — ABNORMAL HIGH (ref 70–99)
Glucose-Capillary: 116 mg/dL — ABNORMAL HIGH (ref 70–99)
Glucose-Capillary: 132 mg/dL — ABNORMAL HIGH (ref 70–99)
Glucose-Capillary: 163 mg/dL — ABNORMAL HIGH (ref 70–99)
Glucose-Capillary: 220 mg/dL — ABNORMAL HIGH (ref 70–99)
Glucose-Capillary: 68 mg/dL — ABNORMAL LOW (ref 70–99)
Glucose-Capillary: 95 mg/dL (ref 70–99)

## 2021-09-28 LAB — URINE CULTURE: Culture: <10000 — AB

## 2021-09-28 MED ORDER — TORSEMIDE 20 MG PO TABS: 40.0000 mg | ORAL_TABLET | Freq: Two times a day (BID) | ORAL | Status: DC

## 2021-09-28 MED ORDER — ENSURE ENLIVE PO LIQD
237.0000 mL | Freq: Two times a day (BID) | ORAL | Status: DC
  Administered 2021-09-28 – 2021-09-29 (×3): 237 mL via ORAL

## 2021-09-28 MED ORDER — ALUM & MAG HYDROXIDE-SIMETH 200-200-20 MG/5ML PO SUSP
15.0000 mL | Freq: Once | ORAL | Status: AC
  Administered 2021-09-28: 15 mL via ORAL
  Filled 2021-09-28: qty 30

## 2021-09-28 MED ORDER — PANTOPRAZOLE SODIUM 40 MG PO TBEC
40.0000 mg | DELAYED_RELEASE_TABLET | Freq: Every day | ORAL | Status: DC
  Administered 2021-09-29 – 2021-09-30 (×3): 40 mg via ORAL
  Filled 2021-09-28 (×3): qty 1

## 2021-09-28 MED ORDER — RENA-VITE PO TABS
1.0000 | ORAL_TABLET | Freq: Every day | ORAL | Status: DC
  Administered 2021-09-28 – 2021-09-29 (×2): 1 via ORAL
  Filled 2021-09-28 (×2): qty 1

## 2021-09-28 MED ORDER — POTASSIUM CHLORIDE CRYS ER 20 MEQ PO TBCR
30.0000 meq | EXTENDED_RELEASE_TABLET | Freq: Once | ORAL | Status: AC
  Administered 2021-09-28: 30 meq via ORAL
  Filled 2021-09-28: qty 1

## 2021-09-28 MED ORDER — POTASSIUM CHLORIDE CRYS ER 20 MEQ PO TBCR
20.0000 meq | EXTENDED_RELEASE_TABLET | Freq: Once | ORAL | Status: DC
Start: 2021-09-28 — End: 2021-09-28

## 2021-09-28 MED ORDER — POLYETHYLENE GLYCOL 3350 17 G PO PACK: 17.0000 g | PACK | Freq: Every day | ORAL | Status: DC | PRN

## 2021-09-28 MED ORDER — POLYETHYLENE GLYCOL 3350 17 G PO PACK
17.0000 g | PACK | Freq: Once | ORAL | Status: DC
  Filled 2021-09-28: qty 1

## 2021-09-28 MED ORDER — AMLODIPINE BESYLATE 10 MG PO TABS
10.0000 mg | ORAL_TABLET | Freq: Every day | ORAL | Status: DC
  Administered 2021-09-28 – 2021-09-30 (×3): 10 mg via ORAL
  Filled 2021-09-28 (×3): qty 1

## 2021-09-28 MED ORDER — ISOSORB DINITRATE-HYDRALAZINE 20-37.5 MG PO TABS
1.0000 | ORAL_TABLET | Freq: Three times a day (TID) | ORAL | Status: DC
  Administered 2021-09-28 – 2021-09-30 (×8): 1 via ORAL
  Filled 2021-09-28 (×8): qty 1

## 2021-09-28 MED ORDER — FUROSEMIDE 10 MG/ML IJ SOLN
80.0000 mg | Freq: Once | INTRAMUSCULAR | Status: AC
  Administered 2021-09-28: 80 mg via INTRAVENOUS
  Filled 2021-09-28: qty 8

## 2021-09-28 NOTE — Assessment & Plan Note (Addendum)
Continue iron supplementation and close follow up as outpatient.

## 2021-09-28 NOTE — Consult Note (Deleted)
[facility] Renal Consultation Note  Requesting MD: Toy Baker Indication for Consultation: CKDV  Subjective: Austin [name] is a 87 y.o. male with history of HFpEF last echo 06/2021 with EF 55-60% and grade I DD, diet controled DM2, CKDV, prostate cancer presents for worsening dyspnea and admitted for CHF exacerbation. Nephrology consulted in setting of advanced renal disease, kidney function has been stable no urgent indication for dialysis.   Patient states he is feeling well this morning. Breathing comfortably on [place]. Denies further episodes of coughing. Feels LE edema is also improving. Still feels somewhat bloated in the abdomen.   Creatinine  Date/Time Value Ref Range Status  12/02/2019 12:00 AM 2.9 (A) 0.6 - 1.3 Final  08/19/2019 11:07 AM 2.81 (H) 0.61 - 1.24 mg/dL Final   Creat  Date/Time Value Ref Range Status  03/03/2020 02:54 PM 3.59 (H) 0.70 - 1.11 mg/dL Final    Comment:    For patients >21 years of age, the reference limit for Creatinine is approximately 13% higher for people identified as African-American. .    Creatinine, Ser  Date/Time Value Ref Range Status  09/28/2021 02:11 AM 4.33 (H) 0.61 - 1.24 mg/dL Final  09/27/2021 08:00 AM 4.52 (H) 0.61 - 1.24 mg/dL Final  09/26/2021 09:17 PM 4.33 (H) 0.61 - 1.24 mg/dL Final  07/21/2021 02:03 PM 5.06 (HH) 0.40 - 1.50 mg/dL Final  07/14/2021 01:45 AM 4.77 (H) 0.61 - 1.24 mg/dL Final  07/13/2021 03:12 AM 4.81 (H) 0.61 - 1.24 mg/dL Final  07/12/2021 02:30 AM 4.86 (H) 0.61 - 1.24 mg/dL Final  07/11/2021 02:35 AM 4.77 (H) 0.61 - 1.24 mg/dL Final  07/10/2021 02:46 AM 4.52 (H) 0.61 - 1.24 mg/dL Final  07/09/2021 09:08 AM 4.04 (H) 0.61 - 1.24 mg/dL Final  07/08/2021 11:58 PM 4.02 (H) 0.61 - 1.24 mg/dL Final  06/11/2021 10:57 AM 3.70 (H) 0.40 - 1.50 mg/dL Final  05/27/2021 11:19 AM 4.07 (H) 0.40 - 1.50 mg/dL Final  10/12/2020 03:39 AM 4.78 (H) 0.61 - 1.24 mg/dL Final  10/11/2020 05:04 AM 4.64 (H) 0.61 -  1.24 mg/dL Final  10/10/2020 06:53 AM 5.01 (H) 0.61 - 1.24 mg/dL Final  10/09/2020 08:00 PM 5.38 (H) 0.61 - 1.24 mg/dL Final  10/09/2020 03:33 PM 5.86 (H) 0.61 - 1.24 mg/dL Final  10/09/2020 10:35 AM 6.24 (H) 0.61 - 1.24 mg/dL Final  09/23/2020 09:45 AM 5.63 (HH) 0.40 - 1.50 mg/dL Final  09/20/2020 02:38 AM 4.22 (H) 0.61 - 1.24 mg/dL Final  09/19/2020 05:01 AM 3.94 (H) 0.61 - 1.24 mg/dL Final  09/18/2020 05:27 AM 3.70 (H) 0.61 - 1.24 mg/dL Final  09/17/2020 01:59 AM 3.60 (H) 0.61 - 1.24 mg/dL Final  09/16/2020 07:59 AM 3.51 (H) 0.61 - 1.24 mg/dL Final  02/26/2020 07:24 AM 3.35 (H) 0.61 - 1.24 mg/dL Final  02/25/2020 03:21 AM 3.12 (H) 0.61 - 1.24 mg/dL Final  02/24/2020 03:39 AM 2.92 (H) 0.61 - 1.24 mg/dL Final  02/23/2020 03:59 PM 3.09 (H) 0.61 - 1.24 mg/dL Final  11/30/2019 08:00 PM 3.08 (H) 0.61 - 1.24 mg/dL Final  11/28/2019 05:29 AM 2.91 (H) 0.61 - 1.24 mg/dL Final  08/14/2019 10:28 AM 2.73 (H) 0.40 - 1.50 mg/dL Final  08/06/2019 05:43 AM 3.00 (H) 0.61 - 1.24 mg/dL Final  08/05/2019 05:36 AM 2.95 (H) 0.61 - 1.24 mg/dL Final  08/04/2019 05:08 AM 2.67 (H) 0.61 - 1.24 mg/dL Final  05/01/2019 09:53 AM 3.15 (H) 0.40 - 1.50 mg/dL Final  04/03/2019 01:51 PM 3.46 (H) 0.61 - 1.24 mg/dL Final  03/29/2019 11:51 AM 3.30 (H) 0.61 - 1.24 mg/dL Final  03/29/2019 04:07 AM 3.38 (H) 0.61 - 1.24 mg/dL Final  03/28/2019 07:26 PM 3.30 (H) 0.61 - 1.24 mg/dL Final  03/28/2019 05:40 AM 2.59 (H) 0.61 - 1.24 mg/dL Final  03/27/2019 08:30 PM 2.94 (H) 0.61 - 1.24 mg/dL Final  01/14/2019 01:15 PM 2.88 (H) 0.61 - 1.24 mg/dL Final  07/25/2018 11:24 AM 2.49 (H) 0.40 - 1.50 mg/dL Final  11/20/2017 10:27 AM 2.08 (H) 0.40 - 1.50 mg/dL Final  01/09/2017 10:16 AM 1.80 (H) 0.40 - 1.50 mg/dL Final  12/12/2016 01:39 PM 2.06 (H) 0.40 - 1.50 mg/dL Final  11/09/2016 02:26 PM 2.32 (H) 0.40 - 1.50 mg/dL Final  11/05/2016 06:12 AM 1.45 (H) 0.61 - 1.24 mg/dL Final  11/04/2016 12:41 AM 1.90 (H) 0.61 - 1.24 mg/dL Final   11/03/2016 09:15 AM 2.29 (H) 0.61 - 1.24 mg/dL Final  09/09/2016 12:13 PM 2.20 (H) 0.40 - 1.50 mg/dL Final     PMHx:   Past Medical History:  Diagnosis Date   Acute diastolic CHF (congestive heart failure) ([place]) 09/17/2020   Acute exacerbation of CHF (congestive heart failure) ([place]) 08/04/2019   ANEMIA DUE TO CHRONIC BLOOD LOSS 03/13/2007   CAROTID ARTERY STENOSIS 05/10/2010   CHF (congestive heart failure) ([place])    DIABETES MELLITUS, TYPE II 09/19/2007   DISEASE, CEREBROVASCULAR NEC 03/05/2007   GERD 03/13/2007   HYPERLIPIDEMIA 03/05/2007   HYPERTENSION 03/05/2007   HYPOKALEMIA 11/09/2009   KNEE PAIN, RIGHT 11/09/2009   PROSTATE CANCER, HX OF 03/05/2007   RENAL DISEASE, CHRONIC 02/03/2009    Past Surgical History:  Procedure Laterality Date   CAROTID ARTERY ANGIOPLASTY Right Oct. 10, 2001   ESOPHAGOGASTRODUODENOSCOPY (EGD) WITH PROPOFOL N/A 11/04/2016   Procedure: ESOPHAGOGASTRODUODENOSCOPY (EGD) WITH PROPOFOL;  Surgeon: Otis Brace, MD;  Location: [facility];  Service: Gastroenterology;  Laterality: N/A;   LAPAROSCOPIC APPENDECTOMY  02/20/2012   Procedure: APPENDECTOMY LAPAROSCOPIC;  Surgeon: Stark Klein, MD;  Location: [facility];  Service: General;  Laterality: N/A;   PROSTATE SURGERY     prostatectomy    Family Hx:  Family History  Problem Relation Age of Onset   Hypertension Mother    Cancer Father        Mesothelioma    Stomach cancer Brother    Cancer Brother    Cancer - Cervical Brother    Cancer Brother    Diabetes Brother    Esophageal cancer Neg Hx    Colon cancer Neg Hx    Pancreatic cancer Neg Hx     Social History:  reports that he quit smoking about 47 years ago. His smoking use included cigarettes. He has a 10.00 pack-year smoking history. He has never used smokeless tobacco. He reports that he does not drink alcohol and does not use drugs.  Allergies:  Allergies  Allergen Reactions   Aspirin Other (See Comments)    High doses causes stomach ulcer and  bleeding    Medications: Prior to Admission medications   Medication Sig Start Date End Date Taking? Authorizing Provider  ACCU-CHEK GUIDE test strip USED TO CHECK BLOOD GLUCOSE TWICE A DAY OR AS NEEDED Patient taking differently: in the morning and at bedtime. 10/20/20  Yes Nafziger, Tommi Rumps, NP  Accu-Chek Softclix Lancets lancets USED TO CHECK BLOOD GLUCOSE TWICE A DAY OR AS NEEDED Patient taking differently: in the morning and at bedtime. 12/25/20  Yes Nafziger, Tommi Rumps, NP  acetaminophen (TYLENOL) 325 MG tablet Take 325 mg by mouth 4 (four) times  daily.   Yes [provider]  albuterol (VENTOLIN HFA) 108 (90 Base) MCG/ACT inhaler TAKE 2 PUFFS BY MOUTH EVERY 6 HOURS AS NEEDED FOR WHEEZE OR SHORTNESS OF BREATH Patient taking differently: Inhale 2 puffs into the lungs every 6 (six) hours as needed for shortness of breath or wheezing. 08/04/21  Yes Collene Gobble, MD  amLODipine (NORVASC) 10 MG tablet TAKE 1 TABLET BY MOUTH EVERY DAY Patient taking differently: Take 10 mg by mouth daily. 07/13/21  Yes Nafziger, Tommi Rumps, NP  atorvastatin (LIPITOR) 20 MG tablet TAKE 1 TABLET BY MOUTH EVERY DAY Patient taking differently: Take 20 mg by mouth daily. 12/29/20  Yes Nafziger, Tommi Rumps, NP  bisacodyl (DULCOLAX) 10 MG suppository Place 1 suppository (10 mg total) rectally daily as needed for mild constipation. 07/13/21  Yes Geradine Girt, DO  Blood Glucose Monitoring Suppl (ACCU-CHEK AVIVA PLUS) w/Device KIT Used to check blood glucose 2 times a day or PRN Patient taking differently: in the morning and at bedtime. 09/04/19  Yes Nafziger, Tommi Rumps, NP  cholecalciferol (VITAMIN D3) 25 MCG (1000 UNIT) tablet Take 1,000 Units by mouth daily.   Yes [provider]  cloNIDine (CATAPRES) 0.3 MG tablet Take 1 tablet (0.3 mg total) by mouth 3 (three) times daily. 09/20/20  Yes Harold Hedge, MD  clopidogrel (PLAVIX) 75 MG tablet TAKE 1 TABLET BY MOUTH EVERY DAY Patient taking differently: Take 75 mg by mouth  daily. 07/13/21  Yes Nafziger, Tommi Rumps, NP  doxazosin (CARDURA) 2 MG tablet Take 2 mg by mouth at bedtime.  12/28/17  Yes [provider]  esomeprazole (NEXIUM) 40 MG capsule TAKE 1 CAPSULE BY MOUTH EVERY DAY Patient taking differently: 40 mg daily. 05/13/21  Yes Nafziger, Tommi Rumps, NP  glipiZIDE (GLUCOTROL XL) 5 MG 24 hr tablet TAKE 1 TABLET BY MOUTH EVERY DAY Patient taking differently: Take 5 mg by mouth daily with breakfast. 09/16/21  Yes Nafziger, Tommi Rumps, NP  isosorbide-hydrALAZINE (BIDIL) 20-37.5 MG tablet Take 1 tablet by mouth 3 (three) times daily.   Yes [provider]  KLOR-CON M10 10 MEQ tablet Take 2 tablets (20 mEq total) by mouth 2 (two) times daily. Patient taking differently: Take 10 mEq by mouth 2 (two) times daily. 09/20/20 09/26/21 Yes Harold Hedge, MD  lactulose ([place]) 10 GM/15ML solution TAKE 15 ML BY MOUTH 2 TIMES DAILY AS NEEDED FOR MILD CONSTIPATION. Patient taking differently: Take 10 g by mouth See admin instructions. Every 2-3 days, alternating with Citrucel 03/18/21  Yes Nafziger, Tommi Rumps, NP  methylcellulose (CITRUCEL) oral powder Take 1 packet by mouth See admin instructions. Every 2-3 days,alternating with lactulose   Yes [provider]  metoprolol succinate (TOPROL-XL) 100 MG 24 hr tablet Take 1 tablet (100 mg total) by mouth daily. Take with or immediately following a meal. 09/21/20 09/26/21 Yes Harold Hedge, MD  Propylene Glycol (SYSTANE BALANCE OP) Place 1 drop into both eyes at bedtime.   Yes [provider]  torsemide (DEMADEX) 20 MG tablet Take 20 mg by mouth 2 (two) times daily.   Yes [provider]  triamcinolone cream (KENALOG) 0.1 % Apply 1 application topically daily as needed (dry/irritated skin). 07/05/19  Yes [provider]  hydrocortisone (ANUSOL-HC) 2.5 % rectal cream PLACE 1 APPLICATION RECTALLY 2 TIMES A DAY. Patient taking differently: Place 1 application rectally 2 (two) times daily. 03/31/21   Burchette,  Alinda Sierras, MD  polyethylene glycol (MIRALAX / GLYCOLAX) 17 g packet Take 17 g by mouth daily as needed  for mild constipation. Patient not taking: Reported on 09/26/2021    [provider]  torsemide 40 MG TABS Take 40 mg by mouth daily. Patient not taking: Reported on 09/26/2021 07/14/21   Geradine Girt, DO    Prior to Admission:  Facility-Administered Medications Prior to Admission  Medication Dose Route Frequency Provider Last Rate Last Admin   ammonium lactate (LAC-HYDRIN) 12 % lotion   Topical PRN Felipa Furnace, DPM       Medications Prior to Admission  Medication Sig Dispense Refill Last Dose   ACCU-CHEK GUIDE test strip USED TO CHECK BLOOD GLUCOSE TWICE A DAY OR AS NEEDED (Patient taking differently: in the morning and at bedtime.) 100 strip 1    Accu-Chek Softclix Lancets lancets USED TO CHECK BLOOD GLUCOSE TWICE A DAY OR AS NEEDED (Patient taking differently: in the morning and at bedtime.) 100 each 6    acetaminophen (TYLENOL) 325 MG tablet Take 325 mg by mouth 4 (four) times daily.   09/26/2021   albuterol (VENTOLIN HFA) 108 (90 Base) MCG/ACT inhaler TAKE 2 PUFFS BY MOUTH EVERY 6 HOURS AS NEEDED FOR WHEEZE OR SHORTNESS OF BREATH (Patient taking differently: Inhale 2 puffs into the lungs every 6 (six) hours as needed for shortness of breath or wheezing.) 8.5 each 5 09/26/2021   amLODipine (NORVASC) 10 MG tablet TAKE 1 TABLET BY MOUTH EVERY DAY (Patient taking differently: Take 10 mg by mouth daily.) 90 tablet 1 09/26/2021   atorvastatin (LIPITOR) 20 MG tablet TAKE 1 TABLET BY MOUTH EVERY DAY (Patient taking differently: Take 20 mg by mouth daily.) 90 tablet 3 09/26/2021   bisacodyl (DULCOLAX) 10 MG suppository Place 1 suppository (10 mg total) rectally daily as needed for mild constipation. 12 suppository 0 unk   Blood Glucose Monitoring Suppl (ACCU-CHEK AVIVA PLUS) w/Device KIT Used to check blood glucose 2 times a day or PRN (Patient taking differently: in the morning and at bedtime.) 1  kit 0    cholecalciferol (VITAMIN D3) 25 MCG (1000 UNIT) tablet Take 1,000 Units by mouth daily.   09/26/2021   cloNIDine (CATAPRES) 0.3 MG tablet Take 1 tablet (0.3 mg total) by mouth 3 (three) times daily. 60 tablet 0 09/26/2021   clopidogrel (PLAVIX) 75 MG tablet TAKE 1 TABLET BY MOUTH EVERY DAY (Patient taking differently: Take 75 mg by mouth daily.) 90 tablet 3 09/26/2021   doxazosin (CARDURA) 2 MG tablet Take 2 mg by mouth at bedtime.   3 09/25/2021   esomeprazole (NEXIUM) 40 MG capsule TAKE 1 CAPSULE BY MOUTH EVERY DAY (Patient taking differently: 40 mg daily.) 90 capsule 3 09/26/2021   glipiZIDE (GLUCOTROL XL) 5 MG 24 hr tablet TAKE 1 TABLET BY MOUTH EVERY DAY (Patient taking differently: Take 5 mg by mouth daily with breakfast.) 90 tablet 0 09/26/2021   isosorbide-hydrALAZINE (BIDIL) 20-37.5 MG tablet Take 1 tablet by mouth 3 (three) times daily.   09/26/2021   KLOR-CON M10 10 MEQ tablet Take 2 tablets (20 mEq total) by mouth 2 (two) times daily. (Patient taking differently: Take 10 mEq by mouth 2 (two) times daily.) 120 tablet 0 09/26/2021   lactulose (CHRONULAC) 10 GM/15ML solution TAKE 15 ML BY MOUTH 2 TIMES DAILY AS NEEDED FOR MILD CONSTIPATION. (Patient taking differently: Take 10 g by mouth See admin instructions. Every 2-3 days, alternating with Citrucel) 237 mL 2 09/26/2021   methylcellulose (CITRUCEL) oral powder Take 1 packet by mouth See admin instructions. Every 2-3 days,alternating with lactulose   09/25/2021  metoprolol succinate (TOPROL-XL) 100 MG 24 hr tablet Take 1 tablet (100 mg total) by mouth daily. Take with or immediately following a meal. 30 tablet 0 09/26/2021 at 1130   Propylene Glycol (SYSTANE BALANCE OP) Place 1 drop into both eyes at bedtime.   09/25/2021   torsemide (DEMADEX) 20 MG tablet Take 20 mg by mouth 2 (two) times daily.   09/26/2021   triamcinolone cream (KENALOG) 0.1 % Apply 1 application topically daily as needed (dry/irritated skin).   Past Month   hydrocortisone (ANUSOL-HC)  2.5 % rectal cream PLACE 1 APPLICATION RECTALLY 2 TIMES A DAY. (Patient taking differently: Place 1 application rectally 2 (two) times daily.) 30 g 0    polyethylene glycol (MIRALAX / GLYCOLAX) 17 g packet Take 17 g by mouth daily as needed for mild constipation. (Patient not taking: Reported on 09/26/2021)   Not Taking   torsemide 40 MG TABS Take 40 mg by mouth daily. (Patient not taking: Reported on 09/26/2021) 30 tablet 0 Not Taking    Labs:  Results for orders placed or performed during the hospital encounter of 09/26/21 (from the past 48 hour(s))  Basic metabolic panel     Status: Abnormal   Collection Time: 09/26/21  9:17 PM  Result Value Ref Range   Sodium 144 135 - 145 mmol/L   Potassium 3.7 3.5 - 5.1 mmol/L   Chloride 113 (H) 98 - 111 mmol/L   CO2 18 (L) 22 - 32 mmol/L   Glucose, Bld 181 (H) 70 - 99 mg/dL    Comment: Glucose reference range applies only to samples taken after fasting for at least 8 hours.   BUN 66 (H) 8 - 23 mg/dL   Creatinine, Ser 4.33 (H) 0.61 - 1.24 mg/dL   Calcium 9.1 8.9 - 10.3 mg/dL   GFR, Estimated 13 (L) >60 mL/min    Comment: (NOTE) Calculated using the CKD-EPI Creatinine Equation (2021)    Anion gap 13 5 - 15    Comment: Performed at [facility] [address], [place], [place] [address]  Magnesium     Status: None   Collection Time: 09/26/21  9:17 PM  Result Value Ref Range   Magnesium 2.3 1.7 - 2.4 mg/dL    Comment: Performed at [facility], [place] [address] [place], [place] [address]  Brain natriuretic peptide (order ONLY if patient c/o SOB)     Status: Abnormal   Collection Time: 09/26/21  9:17 PM  Result Value Ref Range   B Natriuretic Peptide 3,670.5 (H) 0.0 - 100.0 pg/mL    Comment: Performed at [facility] [address], [place], [place] [address]  CBC     Status: Abnormal   Collection Time: 09/26/21  9:17 PM  Result Value Ref Range   WBC 10.7 (H) 4.0 - 10.5 K/uL   RBC 3.67 (L) 4.22 - 5.81 MIL/uL   Hemoglobin 9.9  (L) 13.0 - 17.0 g/dL   HCT 31.6 (L) 39.0 - 52.0 %   MCV 86.1 80.0 - 100.0 fL   MCH 27.0 26.0 - 34.0 pg   MCHC 31.3 30.0 - 36.0 g/dL   RDW 17.0 (H) 11.5 - 15.5 %   Platelets 280 150 - 400 K/uL   nRBC 0.0 0.0 - 0.2 %    Comment: Performed at [facility] [address], [place], [place] [address]  Urinalysis, Routine w reflex microscopic     Status: Abnormal   Collection Time: 09/26/21  9:47  PM  Result Value Ref Range   Color, Urine YELLOW YELLOW   APPearance CLOUDY (A) CLEAR   Specific Gravity, Urine 1.020 1.005 - 1.030   pH 6.0 5.0 - 8.0   Glucose, UA NEGATIVE NEGATIVE mg/dL   Hgb urine dipstick SMALL (A) NEGATIVE   Bilirubin Urine NEGATIVE NEGATIVE   Ketones, ur NEGATIVE NEGATIVE mg/dL   Protein, ur >300 (A) NEGATIVE mg/dL   Nitrite POSITIVE (A) NEGATIVE   Leukocytes,Ua NEGATIVE NEGATIVE    Comment: Performed at [facility] [address] [place], [place] [address]  Urinalysis, Microscopic (reflex)     Status: Abnormal   Collection Time: 09/26/21  9:47 PM  Result Value Ref Range   RBC / HPF 0-5 0 - 5 RBC/hpf   WBC, UA 11-20 0 - 5 WBC/hpf   Bacteria, UA MANY (A) NONE SEEN   Squamous Epithelial / LPF 0-5 0 - 5   Mucus PRESENT     Comment: Performed at [facility], [place] [address] [place], [place] [address]  CBG monitoring, ED     Status: Abnormal   Collection Time: 09/27/21 12:12 AM  Result Value Ref Range   Glucose-Capillary 124 (H) 70 - 99 mg/dL    Comment: Glucose reference range applies only to samples taken after fasting for at least 8 hours.  Troponin I (High Sensitivity)     Status: Abnormal   Collection Time: 09/27/21  1:19 AM  Result Value Ref Range   Troponin I (High Sensitivity) 137 (HH) <18 ng/L    Comment: CRITICAL RESULT CALLED TO, READ BACK BY AND VERIFIED WITH: CHRIS CHRISCO RN 09/27/21 0224 M KOROLESKI (NOTE) Elevated high sensitivity troponin I (hsTnI) values and significant  changes across serial measurements may suggest ACS but  many other  chronic and acute conditions are known to elevate hsTnI results.  Refer to the Links section for chest pain algorithms and additional  guidance. Performed at [facility], [place] [address], [place], [place] [address]   Vitamin B12     Status: Abnormal   Collection Time: 09/27/21  1:19 AM  Result Value Ref Range   Vitamin B-12 158 (L) 180 - 914 pg/mL    Comment: (NOTE) This assay is not validated for testing neonatal or myeloproliferative syndrome specimens for Vitamin B12 levels. Performed at [facility], [place] [address] [place], [place] [address]   Folate     Status: None   Collection Time: 09/27/21  1:19 AM  Result Value Ref Range   Folate 19.9 >5.9 ng/mL    Comment: Performed at [facility] [address], [place], [place] [address]  Iron and TIBC     Status: Abnormal   Collection Time: 09/27/21  1:19 AM  Result Value Ref Range   Iron 45 45 - 182 ug/dL   TIBC 239 (L) 250 - 450 ug/dL   Saturation Ratios 19 17.9 - 39.5 %   UIBC 194 ug/dL    Comment: Performed at [facility], [place] [address], [place], [place] [address]  Ferritin     Status: None   Collection Time: 09/27/21  1:19 AM  Result Value Ref Range   Ferritin 59 24 - 336 ng/mL    Comment: Performed at [facility], [place] [address] [place], [place] [address]  Reticulocytes     Status: Abnormal   Collection Time: 09/27/21  1:19 AM  Result Value Ref Range   Retic Ct Pct  0.9 0.4 - 3.1 %   RBC. 3.31 (L) 4.22 - 5.81 MIL/uL   Retic Count, Absolute 29.8 19.0 - 186.0 K/uL   Immature Retic Fract 12.6 2.3 - 15.9 %    Comment: Performed at [facility] [address], [place], [place] [address]  Hemoglobin A1c     Status: Abnormal   Collection Time: 09/27/21  1:19 AM  Result Value Ref Range   Hgb A1c MFr Bld 6.1 (H) 4.8 - 5.6 %    Comment: (NOTE) Pre diabetes:          5.7%-6.4%  Diabetes:              >6.4%  Glycemic control for   <7.0% adults with diabetes    Mean  Plasma Glucose 128.37 mg/dL    Comment: Performed at [facility] [address] [place], [place] [address]  Differential     Status: Abnormal   Collection Time: 09/27/21  1:19 AM  Result Value Ref Range   Neutrophils Relative % 89 %   Neutro Abs 8.2 (H) 1.7 - 7.7 K/uL   Lymphocytes Relative 5 %   Lymphs Abs 0.4 (L) 0.7 - 4.0 K/uL   Monocytes Relative 5 %   Monocytes Absolute 0.4 0.1 - 1.0 K/uL   Eosinophils Relative 0 %   Eosinophils Absolute 0.0 0.0 - 0.5 K/uL   Basophils Relative 0 %   Basophils Absolute 0.0 0.0 - 0.1 K/uL   Immature Granulocytes 1 %   Abs Immature Granulocytes 0.04 0.00 - 0.07 K/uL    Comment: Performed at [facility] [address] [place], [place] [address]  Hepatic function panel     Status: Abnormal   Collection Time: 09/27/21  1:19 AM  Result Value Ref Range   Total Protein 7.1 6.5 - 8.1 g/dL   Albumin 3.4 (L) 3.5 - 5.0 g/dL   AST 13 (L) 15 - 41 U/L   ALT 13 0 - 44 U/L   Alkaline Phosphatase 81 38 - 126 U/L   Total Bilirubin 0.4 0.3 - 1.2 mg/dL   Bilirubin, Direct <0.1 0.0 - 0.2 mg/dL   Indirect Bilirubin NOT CALCULATED 0.3 - 0.9 mg/dL    Comment: Performed at [facility] [address], [place], [place] [address]  Phosphorus     Status: None   Collection Time: 09/27/21  1:19 AM  Result Value Ref Range   Phosphorus 4.1 2.5 - 4.6 mg/dL    Comment: Performed at [facility] [address] [place], [place] [address]  TSH     Status: None   Collection Time: 09/27/21  1:19 AM  Result Value Ref Range   TSH 0.620 0.350 - 4.500 uIU/mL    Comment: Performed by a 3rd Generation assay with a functional sensitivity of <=0.01 uIU/mL. Performed at [facility], [place] [address], [place], [place] [address]   I-Stat venous blood gas, ED     Status: Abnormal   Collection Time: 09/27/21  1:29 AM  Result Value Ref Range   pH, Ven 7.450 (H) 7.250 - 7.430   pCO2, Ven 30.0 (L) 44.0 - 60.0 mmHg   pO2, Ven 126.0 (H) 32.0 - 45.0  mmHg   Bicarbonate 20.8 20.0 - 28.0 mmol/L   TCO2 22 22 - 32 mmol/L   O2 Saturation 99.0 %   Acid-base deficit 3.0 (H) 0.0 - 2.0 mmol/L   Sodium 148 (H) 135 - 145 mmol/L  Potassium 3.7 3.5 - 5.1 mmol/L   Calcium, Ion 1.15 1.15 - 1.40 mmol/L   HCT 27.0 (L) 39.0 - 52.0 %   Hemoglobin 9.2 (L) 13.0 - 17.0 g/dL   Sample type VENOUS   CBG monitoring, ED     Status: Abnormal   Collection Time: 09/27/21  4:38 AM  Result Value Ref Range   Glucose-Capillary 108 (H) 70 - 99 mg/dL    Comment: Glucose reference range applies only to samples taken after fasting for at least 8 hours.  Resp Panel by RT-PCR (Flu A&B, Covid) Nasopharyngeal Swab     Status: None   Collection Time: 09/27/21  4:56 AM   Specimen: Nasopharyngeal Swab; Nasopharyngeal(NP) swabs in vial transport medium  Result Value Ref Range   SARS Coronavirus 2 by RT PCR NEGATIVE NEGATIVE    Comment: (NOTE) SARS-CoV-2 target nucleic acids are NOT DETECTED.  The SARS-CoV-2 RNA is generally detectable in upper respiratory specimens during the acute phase of infection. The lowest concentration of SARS-CoV-2 viral copies this assay can detect is 138 copies/mL. A negative result does not preclude SARS-Cov-2 infection and should not be used as the sole basis for treatment or other patient management decisions. A negative result may occur with  improper specimen collection/handling, submission of specimen other than nasopharyngeal swab, presence of viral mutation(s) within the areas targeted by this assay, and inadequate number of viral copies(<138 copies/mL). A negative result must be combined with clinical observations, patient history, and epidemiological information. The expected result is Negative.  Fact Sheet for Patients:  EntrepreneurPulse.com.au  Fact Sheet for Healthcare Providers:  IncredibleEmployment.be  This test is no t yet approved or cleared by the [place] FDA and  has been  authorized for detection and/or diagnosis of SARS-CoV-2 by FDA under an Emergency Use Authorization (EUA). This EUA will remain  in effect (meaning this test can be used) for the duration of the COVID-19 declaration under Section 564(b)(1) of the Act, 21 U.S.C.section 360bbb-3(b)(1), unless the authorization is terminated  or revoked sooner.       Influenza A by PCR NEGATIVE NEGATIVE   Influenza B by PCR NEGATIVE NEGATIVE    Comment: (NOTE) The Xpert Xpress SARS-CoV-2/FLU/RSV plus assay is intended as an aid in the diagnosis of influenza from Nasopharyngeal swab specimens and should not be used as a sole basis for treatment. Nasal washings and aspirates are unacceptable for Xpert Xpress SARS-CoV-2/FLU/RSV testing.  Fact Sheet for Patients: EntrepreneurPulse.com.au  Fact Sheet for Healthcare Providers: IncredibleEmployment.be  This test is not yet approved or cleared by the [place] FDA and has been authorized for detection and/or diagnosis of SARS-CoV-2 by FDA under an Emergency Use Authorization (EUA). This EUA will remain in effect (meaning this test can be used) for the duration of the COVID-19 declaration under Section 564(b)(1) of the Act, 21 U.S.C. section 360bbb-3(b)(1), unless the authorization is terminated or revoked.  Performed at [facility], [place] [address], [place], [place] [address]   Magnesium     Status: None   Collection Time: 09/27/21  8:00 AM  Result Value Ref Range   Magnesium 2.4 1.7 - 2.4 mg/dL    Comment: Performed at [facility], [place] [address], [place], [place] [address]  Phosphorus     Status: None   Collection Time: 09/27/21  8:00 AM  Result Value Ref Range   Phosphorus 4.2 2.5 - 4.6 mg/dL    Comment: Performed at [facility], [place]  [address], [place], [place] [address]  CBC WITH DIFFERENTIAL     Status: Abnormal   Collection Time: 09/27/21  8:00 AM  Result Value Ref Range   WBC 11.5  (H) 4.0 - 10.5 K/uL   RBC 3.55 (L) 4.22 - 5.81 MIL/uL   Hemoglobin 9.5 (L) 13.0 - 17.0 g/dL   HCT 30.8 (L) 39.0 - 52.0 %   MCV 86.8 80.0 - 100.0 fL   MCH 26.8 26.0 - 34.0 pg   MCHC 30.8 30.0 - 36.0 g/dL   RDW 17.0 (H) 11.5 - 15.5 %   Platelets 244 150 - 400 K/uL   nRBC 0.0 0.0 - 0.2 %   Neutrophils Relative % 90 %   Neutro Abs 10.3 (H) 1.7 - 7.7 K/uL   Lymphocytes Relative 4 %   Lymphs Abs 0.4 (L) 0.7 - 4.0 K/uL   Monocytes Relative 6 %   Monocytes Absolute 0.7 0.1 - 1.0 K/uL   Eosinophils Relative 0 %   Eosinophils Absolute 0.0 0.0 - 0.5 K/uL   Basophils Relative 0 %   Basophils Absolute 0.0 0.0 - 0.1 K/uL   Immature Granulocytes 0 %   Abs Immature Granulocytes 0.05 0.00 - 0.07 K/uL    Comment: Performed at [facility], [address], [place], [place] [address]  TSH     Status: None   Collection Time: 09/27/21  8:00 AM  Result Value Ref Range   TSH 0.545 0.350 - 4.500 uIU/mL    Comment: Performed by a 3rd Generation assay with a functional sensitivity of <=0.01 uIU/mL. Performed at [facility], [place] [address], [place], [place] [address]   Comprehensive metabolic panel     Status: Abnormal   Collection Time: 09/27/21  8:00 AM  Result Value Ref Range   Sodium 147 (H) 135 - 145 mmol/L   Potassium 3.4 (L) 3.5 - 5.1 mmol/L   Chloride 111 98 - 111 mmol/L   CO2 22 22 - 32 mmol/L   Glucose, Bld 122 (H) 70 - 99 mg/dL    Comment: Glucose reference range applies only to samples taken after fasting for at least 8 hours.   BUN 64 (H) 8 - 23 mg/dL   Creatinine, Ser 4.52 (H) 0.61 - 1.24 mg/dL   Calcium 8.9 8.9 - 10.3 mg/dL   Total Protein 6.8 6.5 - 8.1 g/dL   Albumin 3.6 3.5 - 5.0 g/dL   AST 12 (L) 15 - 41 U/L   ALT 13 0 - 44 U/L   Alkaline Phosphatase 84 38 - 126 U/L   Total Bilirubin 0.6 0.3 - 1.2 mg/dL   GFR, Estimated 12 (L) >60 mL/min    Comment: (NOTE) Calculated using the CKD-EPI Creatinine Equation (2021)    Anion gap 14 5 - 15    Comment: Performed at  [facility] [address], [place], [place] [address]  CBG monitoring, ED     Status: Abnormal   Collection Time: 09/27/21  8:41 AM  Result Value Ref Range   Glucose-Capillary 119 (H) 70 - 99 mg/dL    Comment: Glucose reference range applies only to samples taken after fasting for at least 8 hours.  CBG monitoring, ED     Status: None   Collection Time: 09/27/21 12:35 PM  Result Value Ref Range   Glucose-Capillary 72 70 - 99 mg/dL    Comment: Glucose reference range applies only to samples taken after fasting for at least 8 hours.  Glucose, capillary  Status: Abnormal   Collection Time: 09/27/21  4:11 PM  Result Value Ref Range   Glucose-Capillary 106 (H) 70 - 99 mg/dL    Comment: Glucose reference range applies only to samples taken after fasting for at least 8 hours.  Glucose, capillary     Status: Abnormal   Collection Time: 09/27/21  8:22 PM  Result Value Ref Range   Glucose-Capillary 102 (H) 70 - 99 mg/dL    Comment: Glucose reference range applies only to samples taken after fasting for at least 8 hours.  Glucose, capillary     Status: Abnormal   Collection Time: 09/28/21 12:03 AM  Result Value Ref Range   Glucose-Capillary 110 (H) 70 - 99 mg/dL    Comment: Glucose reference range applies only to samples taken after fasting for at least 8 hours.  CBC     Status: Abnormal   Collection Time: 09/28/21  2:11 AM  Result Value Ref Range   WBC 9.3 4.0 - 10.5 K/uL   RBC 3.41 (L) 4.22 - 5.81 MIL/uL   Hemoglobin 9.2 (L) 13.0 - 17.0 g/dL   HCT 28.7 (L) 39.0 - 52.0 %   MCV 84.2 80.0 - 100.0 fL   MCH 27.0 26.0 - 34.0 pg   MCHC 32.1 30.0 - 36.0 g/dL   RDW 16.5 (H) 11.5 - 15.5 %   Platelets 226 150 - 400 K/uL   nRBC 0.0 0.0 - 0.2 %    Comment: Performed at [facility] [address], [place], [place] [address]  Comprehensive metabolic panel     Status: Abnormal   Collection Time: 09/28/21  2:11 AM  Result Value Ref Range   Sodium 147 (H) 135 - 145 mmol/L    Potassium 3.4 (L) 3.5 - 5.1 mmol/L   Chloride 114 (H) 98 - 111 mmol/L   CO2 21 (L) 22 - 32 mmol/L   Glucose, Bld 103 (H) 70 - 99 mg/dL    Comment: Glucose reference range applies only to samples taken after fasting for at least 8 hours.   BUN 71 (H) 8 - 23 mg/dL   Creatinine, Ser 4.33 (H) 0.61 - 1.24 mg/dL   Calcium 8.9 8.9 - 10.3 mg/dL   Total Protein 6.6 6.5 - 8.1 g/dL   Albumin 3.6 3.5 - 5.0 g/dL   AST 14 (L) 15 - 41 U/L   ALT 10 0 - 44 U/L   Alkaline Phosphatase 77 38 - 126 U/L   Total Bilirubin 0.4 0.3 - 1.2 mg/dL   GFR, Estimated 13 (L) >60 mL/min    Comment: (NOTE) Calculated using the CKD-EPI Creatinine Equation (2021)    Anion gap 12 5 - 15    Comment: Performed at [facility], [place] [address], [place], [place] [address]  Glucose, capillary     Status: None   Collection Time: 09/28/21  3:49 AM  Result Value Ref Range   Glucose-Capillary 95 70 - 99 mg/dL    Comment: Glucose reference range applies only to samples taken after fasting for at least 8 hours.  Glucose, capillary     Status: Abnormal   Collection Time: 09/28/21  7:10 AM  Result Value Ref Range   Glucose-Capillary 116 (H) 70 - 99 mg/dL    Comment: Glucose reference range applies only to samples taken after fasting for at least 8 hours.     ROS:  Pertinent items are noted in HPI.  Physical Exam: Vitals:   09/28/21 0710 09/28/21 5465  BP: (!) 156/56 (!) 186/74  Pulse: 73 84  Resp: 20 20  Temp: 98.8 F (37.1 C) 98.7 F (37.1 C)  SpO2: 97% 96%     General:alert, pleasant, laying comfortably in bed HEENT: normocephalic, atraumatic, Eyes:EOMI, conjunctiva normal Neck: No JVD Heart: normal rate, regular no murmur, gallops, or rubs.  Lungs: CTA anteriorly, echo in progress, no increased work of breathing, satting well on 2L [place] Abdomen: softer than yesterday, mild distention, active BS, no tenderness  Extremities: 1+ Bilateral LE edema improved from yesterday Skin: dry flaky of  bilateral shins, no erythema Neuro: aoX4, no focal deficits   Assessment/Plan: 1.CKDV-  baseline creatinine around 4.8, Bun 71 and sCr 4.33, remains stable - no indications to start on PD but preference is for PD when/if RRT needed - lasix as below   2. HFpEF exacerbation- 1.8L urine output overnight, net 1L output, weight down from 81.2 to 75.1 kg. Now on 2L [place], Hypervolemia improving still has some mild abdominal distention -Continue Lasix 80 mg twice daily -torsemide 40 mg BID when he goes home  3. Anemia of CKD  - hgb stable, deferring ESA 2/2 pulmonary nodule  4. Hypokalemia- 30 meq potassium chloride   5. Hypertension- BP remains elevated working to optimize volume status as above. Restart home amlodipine,  bidil and metoprolol. ARB stopped at last admission for AKI with advance renal disease.  6. Diabetes- on glipizide at home. A1c 6.1. SSI  7. Pulmonary nodule- Follows pulm. Next Ct in April 2023  Iona Beard, MD IM PGY-2 09/28/2021, 8:46 AM

## 2021-09-28 NOTE — Progress Notes (Signed)
No Resp. Distress noted. Stable VS. No BIPAP needed at this time.

## 2021-09-28 NOTE — Evaluation (Signed)
Physical Therapy Evaluation Patient Details Name: Austin [name] MRN: [id number] DOB: Oct 19, 1934 Today's Date: 09/28/2021  History of Present Illness  Pt is an 87 y.o. male who presented 09/26/21 with SOB and edema. Pt admitted with acute on chronic diastolic CHF, progressive CKD 5, and pulmonary edema. PMH: diastolic CHF, DM2 HTN, HLD, prostate CA hx   Clinical Impression  Pt presents with condition above and deficits mentioned below, see PT Problem List. PTA, he was independent without DME, living in a multi-level home with his wife, in which their bedroom is on the 2nd story. He denies any falls. Currently, pt displays deficits in aerobic endurance/activity tolerance and as his DOE worsens it appears to impact his gait pace and stability. Pt is capable of ambulating household distances without UE support or physical assistance though. Coordinated with mobility specialists to add him to their caseload. Will continue to follow acutely to maximize his return to baseline prior to discharge, but will likely only need 1-2 more PT sessions. Educated pt on weighing self daily, fluid restriction, and sodium restriction to manage CHF. See General Comments below in regards to SpO2 levels.        Recommendations for follow up therapy are one component of a multi-disciplinary discharge planning process, led by the attending physician.  Recommendations may be updated based on patient status, additional functional criteria and insurance authorization.  Follow Up Recommendations No PT follow up    Assistance Recommended at Discharge PRN  Patient can return home with the following  Assistance with cooking/housework;Help with stairs or ramp for entrance    Equipment Recommendations None recommended by PT  Recommendations for Other Services       Functional Status Assessment Patient has had a recent decline in their functional status and demonstrates the ability to make significant improvements in function in a  reasonable and predictable amount of time.     Precautions / Restrictions Precautions Precautions: Fall;Other (comment) Precaution Comments: monitor SpO2 Restrictions Weight Bearing Restrictions: No      Mobility  Bed Mobility Overal bed mobility: Modified Independent             General bed mobility comments: Extra time and use of bed rails, HOB elevated.    Transfers Overall transfer level: Needs assistance Equipment used: None Transfers: Sit to/from Stand Sit to Stand: Supervision           General transfer comment: Supervision for safety, no LOB.    Ambulation/Gait Ambulation/Gait assistance: Supervision, Min guard Gait Distance (Feet): 200 Feet Assistive device: None Gait Pattern/deviations: Step-through pattern, Decreased stride length Gait velocity: reduced Gait velocity interpretation: <1.8 ft/sec, indicate of risk for recurrent falls   General Gait Details: Pt with slow, but mostly steady gait. No LOB, but DOE 3/4, SpO2 >/= 91% on 2L. As DOE increased he began to slow and display some mild instability, but no overt LOB.  Stairs            Wheelchair Mobility    Modified Rankin (Stroke Patients Only)       Balance Overall balance assessment: Mild deficits observed, not formally tested                                           Pertinent Vitals/Pain Pain Assessment Pain Assessment: Faces Faces Pain Scale: No hurt Pain Intervention(s): Monitored during session    Home Living Family/patient expects to  be discharged to:: Private residence Living Arrangements: Spouse/significant other Available Help at Discharge: Family;Available 24 hours/day Type of Home: House Home Access: Stairs to enter Entrance Stairs-Rails: Left (ascending) Entrance Stairs-Number of Steps: 3-4 Alternate Level Stairs-Number of Steps: flight Home Layout: Multi-level;Bed/bath upstairs;1/2 bath on main level Home Equipment: BSC/3in1 Additional  Comments: Not on O2 at home    Prior Function Prior Level of Function : Independent/Modified Independent;Driving             Mobility Comments: Does not use an AD. Denies any falls.       Hand Dominance        Extremity/Trunk Assessment   Upper Extremity Assessment Upper Extremity Assessment: Defer to OT evaluation    Lower Extremity Assessment Lower Extremity Assessment: Overall WFL for tasks assessed (MMT scores of 4+ grossly bil, denies numbness/tingling bil currently)    Cervical / Trunk Assessment Cervical / Trunk Assessment: Normal  Communication   Communication: No difficulties  Cognition Arousal/Alertness: Awake/alert Behavior During Therapy: WFL for tasks assessed/performed Overall Cognitive Status: Within Functional Limits for tasks assessed                                          General Comments General comments (skin integrity, edema, etc.): SpO2 down to 87% on RA at rest, >/= 91% on 2L throughout    Exercises     Assessment/Plan    PT Assessment Patient needs continued PT services  PT Problem List Decreased activity tolerance;Decreased balance;Decreased mobility;Cardiopulmonary status limiting activity       PT Treatment Interventions DME instruction;Gait training;Stair training;Functional mobility training;Therapeutic activities;Therapeutic exercise;Balance training;Neuromuscular re-education;Patient/family education    PT Goals (Current goals can be found in the Care Plan section)  Acute Rehab PT Goals Patient Stated Goal: to get better PT Goal Formulation: With patient/family Time For Goal Achievement: 10/12/21 Potential to Achieve Goals: Good    Frequency Min 2X/week     Co-evaluation PT/OT/SLP Co-Evaluation/Treatment: Yes Reason for Co-Treatment: For patient/therapist safety;To address functional/ADL transfers PT goals addressed during session: Mobility/safety with mobility;Balance         AM-PAC PT "6  Clicks" Mobility  Outcome Measure Help needed turning from your back to your side while in a flat bed without using bedrails?: None Help needed moving from lying on your back to sitting on the side of a flat bed without using bedrails?: None Help needed moving to and from a bed to a chair (including a wheelchair)?: A Little Help needed standing up from a chair using your arms (e.g., wheelchair or bedside chair)?: A Little Help needed to walk in hospital room?: A Little Help needed climbing 3-5 steps with a railing? : A Little 6 Click Score: 20    End of Session Equipment Utilized During Treatment: Oxygen;Gait belt Activity Tolerance: Patient tolerated treatment well Patient left: in chair;with call bell/phone within reach;with chair alarm set   PT Visit Diagnosis: Unsteadiness on feet (R26.81);Other abnormalities of gait and mobility (R26.89);Difficulty in walking, not elsewhere classified (R26.2)    Time: 6063-0160 PT Time Calculation (min) (ACUTE ONLY): 38 min   Charges:   PT Evaluation $PT Eval Moderate Complexity: 1 Mod PT Treatments $Gait Training: 8-22 mins        Moishe Spice, PT, DPT Acute Rehabilitation Services  Pager: [phone] Office: [place] 09/28/2021, 12:39 PM

## 2021-09-28 NOTE — Assessment & Plan Note (Addendum)
CT from 08/2020 showed interval increase in size of right  Upper lobe subpleural nodule and recommendations to follow up with PAT scan.  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 Plan to repeat CT scan in 11/2021.

## 2021-09-28 NOTE — Consult Note (Signed)
° °  [facility] Inpatient Consult   09/28/2021  Crystal Ellwood [birth year] [id number]  [facility] [ACO] Patient: [employer]  Primary Care Provider:  Dorothyann Peng, NP Velora Heckler Jacklynn Ganong  is an embedded provider with a Chronic Care Management team and program, and is listed for the transition of care follow up and appointments.  Patient was screened for Embedded practice service needs for chronic care management is showing as active in the Embedded Chronic Care Management.  1209 pm:  came by to see patient and patient is working with therapy.  Will continue to follow progress  Plan: Notification to be sent to the [facility] RN Chronic Care Management for updates and post hospital disposition and needs.  Please contact for further questions,  Natividad Brood, RN BSN [facility] Liaison  [phone] business mobile phone Toll free office [phone]  Fax number: [phone] [email or web address]

## 2021-09-28 NOTE — Progress Notes (Addendum)
Initial Nutrition Assessment  DOCUMENTATION CODES:   Not applicable  INTERVENTION:   Recommend liberalizing pt diet to regular due to pt increased needs. MD messaged.  Renal Multivitamin w/ minerals daily Ensure Enlive po BID, each supplement provides 350 kcal and 20 grams of protein.  NUTRITION DIAGNOSIS:   Increased nutrient needs related to chronic illness (CHF) as evidenced by estimated needs.  GOAL:   Patient will meet greater than or equal to 90% of their needs  MONITOR:   PO intake, Supplement acceptance, Weight trends, Labs  REASON FOR ASSESSMENT:   Consult Assessment of nutrition requirement/status  ASSESSMENT:   87 y.o. male presented to the ED with SOB and edema. PMH includes CHF, T2DM, HTN, GERD, hx prostate cancer, and CKD V (currently undergoing evaluation for PD). Pt admitted with acute on chronic CHF.   Pt reports that his appetite was good at home and has been good here as well. Pt reports that he was having SOB while eating but that has now resolved. Pt denies any difficulty chewing or swallowing. Pt reports a typical intake of, Breakfast: eggs w/ bacon/sausage or pancakes/waffles or hash Lunch/Dinner: chicken or [place] or veal with a few sides   Per EMR, pt has ate 75% of his breakfast on 2/7.   Pt reports that he weighs himself pretty frequently, but not every day. Pt reports that he has been steady around 179# for a while now. Per EMR, pt has had 13% weight loss within 3 months, which is clinically significant for time frame. Although, due to pt chronic illness's unable to determine if loss is related to fluid retention or actual dry weight. Pt reports that he uses no assistance with ambulating.  Discussed ONS with pt, pt agreeable to ONS.   Pt with no other questions or concerns at this time.   Addend: Sent secure chat to MD to discuss liberalizing pt diet. MD concerned with pt being CKD V, agreed to put pt on 2 gm Na diet.   Medications reviewed  and include: Lasix, SSI 0-9 units q4h, Potassium Chloride, IV antibiotics  Labs reviewed:  - Sodium 147  - Potassium 3.4 - Hgb A1c 6.1% - 24 hr CBG 72-116  NUTRITION - FOCUSED PHYSICAL EXAM:  Flowsheet Row Most Recent Value  Orbital Region No depletion  Upper Arm Region No depletion  Thoracic and Lumbar Region No depletion  Buccal Region No depletion  Temple Region Mild depletion  Clavicle Bone Region Mild depletion  Clavicle and Acromion Bone Region Mild depletion  Scapular Bone Region Mild depletion  Dorsal Hand Mild depletion  Patellar Region Mild depletion  Anterior Thigh Region Mild depletion  Posterior Calf Region Mild depletion  Edema (RD Assessment) None  Hair Reviewed  Eyes Reviewed  Mouth Reviewed  Skin Reviewed  Nails Reviewed       Diet Order:   Diet Order             Diet heart healthy/carb modified Room service appropriate? Yes; Fluid consistency: Thin  Diet effective now                   EDUCATION NEEDS:   No education needs have been identified at this time  Skin:  Skin Assessment: Reviewed RN Assessment  Last BM:  2/5  Height:   Ht Readings from Last 1 Encounters:  09/26/21 5\' 7"  (1.702 m)    Weight:   Wt Readings from Last 1 Encounters:  09/28/21 75.1 kg    Ideal Body  Weight:  67.3 kg  BMI:  Body mass index is 25.93 kg/m.  Estimated Nutritional Needs:   Kcal:  2200-2400  Protein:  110-125 grams  Fluid:  </= 2 L    Garin Mata Louie Casa, RD, LDN Clinical Dietitian See [facility] for contact information.

## 2021-09-28 NOTE — Progress Notes (Signed)
°  Echocardiogram 2D Echocardiogram has been performed.  Austin [name] 09/28/2021, 10:10 AM

## 2021-09-28 NOTE — Progress Notes (Signed)
PROGRESS NOTE    Austin [name]  RXV:[id number] DOB: July 28, 1935 DOA: 09/26/2021 PCP: Dorothyann Peng, NP  Brief Narrative:87/M with history of CKD 5, chronic diastolic CHF, type 2 diabetes mellitus, history of prostate cancer presented to the ED with worsening dyspnea on exertion, abdominal and leg swelling X 4 days.  Followed by nephrology, on torsemide daily, compliant with meds. In the ED hypoxic to low 80s requiring 6 L, subsequently became tachypneic and placed on BiPAP, chest x-ray noted interstitial edema and cardiomegaly -Improving with diuresis, nephrology following, plan to hold off on dialysis at this time  Subjective: -Feels better, breathing is improving  Assessment and Plan: * Acute respiratory failure with hypoxia (HCC)- (present on admission) Acute on chronic systolic and diastolic CHF Progressive CKD 5 Pulmonary edema -Required BiPAP on admission, now off -Last echo 11/22 with EF 55% and grade 1 DD, repeat echo today -Diuresing on IV Lasix, patient reports considerable improvement in symptoms, oxygen requirement is also improving, suspect urine output is not accurate, 1 L negative charted so far -Nephrology following -BMP in a.m., plan for peritoneal dialysis in the future  Iron deficiency anemia due to chronic blood loss- (present on admission) Chronic anemia, hemoglobin is stable  Diabetes mellitus with renal complications ([place])- (present on admission) Glipizide on hold, CBGs stable, hemoglobin A1c 6.1  Essential hypertension- (present on admission) Improving, restarted on home regimen of amlodipine,  BiDil, Toprol and clonidine -Wean down clonidine as tolerated, diuretics as above  UTI (urinary tract infection)- (present on admission) Continue ceftriaxone day 2, follow-up urine cultures  Pulmonary nodule 1 cm or greater in diameter- (present on admission) Needs follow-up  PROSTATE CANCER, HX OF - Monitor for urinary retention, follow-up with urology   Other:  On Plavix daily, reason unclear  DVT prophylaxis: Heparin subcutaneous Code Status: Full code Family Communication: Updated wife yesterday Disposition Plan: Home pending improvement in volume status, 1 to 2 days  Consultants:  Nephrology  Procedures:   Antimicrobials:    Objective: Vitals:   09/28/21 0355 09/28/21 0710 09/28/21 0829 09/28/21 1114  BP: (!) 173/62 (!) 156/56 (!) 186/74 (!) 157/70  Pulse: 71 73 84 64  Resp: 19 20 20 16   Temp: 99.3 F (37.4 C) 98.8 F (37.1 C) 98.7 F (37.1 C) 98.2 F (36.8 C)  TempSrc: Oral Oral Oral Oral  SpO2: 100% 97% 96% 94%  Weight: 75.1 kg     Height:        Intake/Output Summary (Last 24 hours) at 09/28/2021 1159 Last data filed at 09/28/2021 1004 Gross per 24 hour  Intake 946 ml  Output 1725 ml  Net -779 ml   Filed Weights   09/26/21 2055 09/28/21 0355  Weight: 81.2 kg 75.1 kg    Examination:  General exam: Pleasant elderly male sitting up in bed, AAOx3, no distress HEENT: Positive JVD CVS: S1-S2, regular rhythm Lungs: Fine basilar rales Abdomen: Soft, obese, nontender, bowel sounds present Extremities: 1+ edema  Skin: No rashes Psychiatry:  Mood & affect appropriate.     Data Reviewed:   CBC: Recent Labs  Lab 09/26/21 2117 09/27/21 0119 09/27/21 0129 09/27/21 0800 09/28/21 0211  WBC 10.7*  --   --  11.5* 9.3  NEUTROABS  --  8.2*  --  10.3*  --   HGB 9.9*  --  9.2* 9.5* 9.2*  HCT 31.6*  --  27.0* 30.8* 28.7*  MCV 86.1  --   --  86.8 84.2  PLT 280  --   --  244 333   Basic Metabolic Panel: Recent Labs  Lab 09/26/21 2117 09/27/21 0119 09/27/21 0129 09/27/21 0800 09/28/21 0211  NA 144  --  148* 147* 147*  K 3.7  --  3.7 3.4* 3.4*  CL 113*  --   --  111 114*  CO2 18*  --   --  22 21*  GLUCOSE 181*  --   --  122* 103*  BUN 66*  --   --  64* 71*  CREATININE 4.33*  --   --  4.52* 4.33*  CALCIUM 9.1  --   --  8.9 8.9  MG 2.3  --   --  2.4  --   PHOS  --  4.1  --  4.2  --    GFR: Estimated  Creatinine Clearance: 11.7 mL/min (A) (by C-G formula based on SCr of 4.33 mg/dL (H)). Liver Function Tests: Recent Labs  Lab 09/27/21 0119 09/27/21 0800 09/28/21 0211  AST 13* 12* 14*  ALT 13 13 10   ALKPHOS 81 84 77  BILITOT 0.4 0.6 0.4  PROT 7.1 6.8 6.6  ALBUMIN 3.4* 3.6 3.6   No results for input(s): LIPASE, AMYLASE in the last 168 hours. No results for input(s): AMMONIA in the last 168 hours. Coagulation Profile: No results for input(s): INR, PROTIME in the last 168 hours. Cardiac Enzymes: No results for input(s): CKTOTAL, CKMB, CKMBINDEX, TROPONINI in the last 168 hours. BNP (last 3 results) Recent Labs    06/11/21 1057  PROBNP 1,890.0*   HbA1C: Recent Labs    09/27/21 0119  HGBA1C 6.1*   CBG: Recent Labs  Lab 09/27/21 2022 09/28/21 0003 09/28/21 0349 09/28/21 0710 09/28/21 1112  GLUCAP 102* 110* 95 116* 132*   Lipid Profile: No results for input(s): CHOL, HDL, LDLCALC, TRIG, CHOLHDL, LDLDIRECT in the last 72 hours. Thyroid Function Tests: Recent Labs    09/27/21 0800  TSH 0.545   Anemia Panel: Recent Labs    09/27/21 0119  VITAMINB12 158*  FOLATE 19.9  FERRITIN 59  TIBC 239*  IRON 45  RETICCTPCT 0.9   Urine analysis:    Component Value Date/Time   COLORURINE YELLOW 09/26/2021 2147   APPEARANCEUR CLOUDY (A) 09/26/2021 2147   LABSPEC 1.020 09/26/2021 2147   PHURINE 6.0 09/26/2021 2147   GLUCOSEU NEGATIVE 09/26/2021 2147   HGBUR SMALL (A) 09/26/2021 2147   HGBUR negative 09/19/2007 0000   BILIRUBINUR NEGATIVE 09/26/2021 2147   BILIRUBINUR n 11/18/2014 1003   KETONESUR NEGATIVE 09/26/2021 2147   PROTEINUR >300 (A) 09/26/2021 2147   UROBILINOGEN 0.2 11/18/2014 1003   UROBILINOGEN 1.0 01/26/2013 2059   NITRITE POSITIVE (A) 09/26/2021 2147   LEUKOCYTESUR NEGATIVE 09/26/2021 2147   Sepsis Labs: @LABRCNTIP (procalcitonin:4,lacticidven:4)  ) Recent Results (from the past 240 hour(s))  Urine Culture     Status: Abnormal   Collection  Time: 09/27/21  1:17 AM   Specimen: Urine, Clean Catch  Result Value Ref Range Status   Specimen Description URINE, CLEAN CATCH  Final   Special Requests NONE  Final   Culture (A)  Final    <10,000 COLONIES/mL INSIGNIFICANT GROWTH Performed at [facility], [place] [address], [place], [place] [address]    Report Status 09/28/2021 FINAL  Final  Resp Panel by RT-PCR (Flu A&B, Covid) Nasopharyngeal Swab     Status: None   Collection Time: 09/27/21  4:56 AM   Specimen: Nasopharyngeal Swab; Nasopharyngeal(NP) swabs in vial transport medium  Result Value Ref Range Status   SARS  Coronavirus 2 by RT PCR NEGATIVE NEGATIVE Final    Comment: (NOTE) SARS-CoV-2 target nucleic acids are NOT DETECTED.  The SARS-CoV-2 RNA is generally detectable in upper respiratory specimens during the acute phase of infection. The lowest concentration of SARS-CoV-2 viral copies this assay can detect is 138 copies/mL. A negative result does not preclude SARS-Cov-2 infection and should not be used as the sole basis for treatment or other patient management decisions. A negative result may occur with  improper specimen collection/handling, submission of specimen other than nasopharyngeal swab, presence of viral mutation(s) within the areas targeted by this assay, and inadequate number of viral copies(<138 copies/mL). A negative result must be combined with clinical observations, patient history, and epidemiological information. The expected result is Negative.  Fact Sheet for Patients:  EntrepreneurPulse.com.au  Fact Sheet for Healthcare Providers:  IncredibleEmployment.be  This test is no t yet approved or cleared by the [place] FDA and  has been authorized for detection and/or diagnosis of SARS-CoV-2 by FDA under an Emergency Use Authorization (EUA). This EUA will remain  in effect (meaning this test can be used) for the duration of the COVID-19 declaration under  Section 564(b)(1) of the Act, 21 U.S.C.section 360bbb-3(b)(1), unless the authorization is terminated  or revoked sooner.       Influenza A by PCR NEGATIVE NEGATIVE Final   Influenza B by PCR NEGATIVE NEGATIVE Final    Comment: (NOTE) The Xpert Xpress SARS-CoV-2/FLU/RSV plus assay is intended as an aid in the diagnosis of influenza from Nasopharyngeal swab specimens and should not be used as a sole basis for treatment. Nasal washings and aspirates are unacceptable for Xpert Xpress SARS-CoV-2/FLU/RSV testing.  Fact Sheet for Patients: EntrepreneurPulse.com.au  Fact Sheet for Healthcare Providers: IncredibleEmployment.be  This test is not yet approved or cleared by the [place] FDA and has been authorized for detection and/or diagnosis of SARS-CoV-2 by FDA under an Emergency Use Authorization (EUA). This EUA will remain in effect (meaning this test can be used) for the duration of the COVID-19 declaration under Section 564(b)(1) of the Act, 21 U.S.C. section 360bbb-3(b)(1), unless the authorization is terminated or revoked.  Performed at [facility], [place] [address], [place], [place] [address]      Radiology Studies: DG Chest Portable 1 View  Result Date: 09/26/2021 CLINICAL DATA:  Shortness of breath, difficulty breathing EXAM: PORTABLE CHEST 1 VIEW COMPARISON:  07/09/2021 FINDINGS: Cardiomegaly with vascular congestion and early interstitial edema. No effusions or acute bony abnormality. IMPRESSION: Cardiomegaly, vascular congestion, early interstitial edema. Electronically Signed   By: Rolm Baptise M.D.   On: 09/26/2021 21:28   ECHOCARDIOGRAM COMPLETE  Result Date: 09/28/2021    ECHOCARDIOGRAM REPORT   Patient Name:   Austin [name] Date of Exam: 09/28/2021 Medical Rec #:  [id number]  Height:       67.0 in Accession #:    [id number] Weight:       165.6 lb Date of Birth:  1935-05-25  BSA:          1.866 m Patient Age:    27 years   BP:            156/56 mmHg Patient Gender: M          HR:           77 bpm. Exam Location:  Inpatient Procedure: 2D Echo, 3D Echo, Cardiac Doppler and Color Doppler Indications:    CHF-Acute Diastolic O75.64  History:  Patient has prior history of Echocardiogram examinations, most                 recent 07/10/2021. CHF; Risk Factors:Hypertension, Dyslipidemia                 and Diabetes.  Sonographer:    Bernadene Person RDCS Referring Phys: [place]  1. Left ventricular ejection fraction, by estimation, is 40 to 45%. The left ventricle has mildly decreased function. The left ventricle demonstrates global hypokinesis. There is mild concentric left ventricular hypertrophy. Left ventricular diastolic parameters are consistent with Grade II diastolic dysfunction (pseudonormalization). Elevated left atrial pressure.  2. Right ventricular systolic function is normal. The right ventricular size is moderately enlarged. There is severely elevated pulmonary artery systolic pressure. The estimated right ventricular systolic pressure is 65.7 mmHg.  3. Left atrial size was moderately dilated.  4. Right atrial size was moderately dilated.  5. A small pericardial effusion is present. The pericardial effusion is circumferential. There is no evidence of cardiac tamponade.  6. The mitral valve is normal in structure. Mild mitral valve regurgitation.  7. Tricuspid valve regurgitation is moderate to severe.  8. The aortic valve is tricuspid. There is mild calcification of the aortic valve. There is mild thickening of the aortic valve. Aortic valve regurgitation is moderate. Aortic valve sclerosis is present, with no evidence of aortic valve stenosis.  9. There is borderline dilatation of the aortic root, measuring 37 mm. 10. The inferior vena cava is dilated in size with <50% respiratory variability, suggesting right atrial pressure of 15 mmHg. Comparison(s): Prior images reviewed side by side. The left  ventricular function is worsened. Previous study also showed grade 2 diastolic dysfunction (elevated mean left atrial pressure). FINDINGS  Left Ventricle: Left ventricular ejection fraction, by estimation, is 40 to 45%. The left ventricle has mildly decreased function. The left ventricle demonstrates global hypokinesis. 3D left ventricular ejection fraction analysis performed but not reported based on interpreter judgement due to suboptimal tracking. The left ventricular internal cavity size was normal in size. There is mild concentric left ventricular hypertrophy. Left ventricular diastolic parameters are consistent with Grade II diastolic dysfunction (pseudonormalization). Elevated left atrial pressure. Right Ventricle: The right ventricular size is moderately enlarged. No increase in right ventricular wall thickness. Right ventricular systolic function is normal. There is severely elevated pulmonary artery systolic pressure. The tricuspid regurgitant velocity is 3.50 m/s, and with an assumed right atrial pressure of 15 mmHg, the estimated right ventricular systolic pressure is 84.6 mmHg. Left Atrium: Left atrial size was moderately dilated. Right Atrium: Right atrial size was moderately dilated. Pericardium: A small pericardial effusion is present. The pericardial effusion is circumferential. There is no evidence of cardiac tamponade. Mitral Valve: The mitral valve is normal in structure. Mild mitral annular calcification. Mild mitral valve regurgitation. Tricuspid Valve: The tricuspid valve is normal in structure. Tricuspid valve regurgitation is moderate to severe. Aortic Valve: The aortic valve is tricuspid. There is mild calcification of the aortic valve. There is mild thickening of the aortic valve. Aortic valve regurgitation is moderate. Aortic regurgitation PHT measures 325 msec. Aortic valve sclerosis is present, with no evidence of aortic valve stenosis. Pulmonic Valve: The pulmonic valve was normal in  structure. Pulmonic valve regurgitation is not visualized. Aorta: The aortic root and ascending aorta are structurally normal, with no evidence of dilitation. There is borderline dilatation of the aortic root, measuring 37 mm. Venous: The inferior vena cava is dilated in size with  less than 50% respiratory variability, suggesting right atrial pressure of 15 mmHg. IAS/Shunts: No atrial level shunt detected by color flow Doppler. Additional Comments: There is pleural effusion in the left lateral region.  LEFT VENTRICLE PLAX 2D LVIDd:         5.40 cm      Diastology LVIDs:         4.00 cm      LV e' medial:    3.77 cm/s LV PW:         1.20 cm      LV E/e' medial:  28.1 LV IVS:        1.20 cm      LV e' lateral:   4.45 cm/s LVOT diam:     2.10 cm      LV E/e' lateral: 23.8 LV SV:         97 LV SV Index:   52 LVOT Area:     3.46 cm                              3D Volume EF: LV Volumes (MOD)            3D EF:        54 % LV vol d, MOD A2C: 192.0 ml LV EDV:       245 ml LV vol d, MOD A4C: 168.0 ml LV ESV:       112 ml LV vol s, MOD A2C: 94.7 ml  LV SV:        133 ml LV vol s, MOD A4C: 77.8 ml LV SV MOD A2C:     97.3 ml LV SV MOD A4C:     168.0 ml LV SV MOD BP:      93.6 ml RIGHT VENTRICLE RV S prime:     16.30 cm/s TAPSE (M-mode): 2.6 cm LEFT ATRIUM           Index        RIGHT ATRIUM           Index LA diam:      3.70 cm 1.98 cm/m   RA Area:     24.80 cm LA Vol (A4C): 85.7 ml 45.92 ml/m  RA Volume:   90.50 ml  48.49 ml/m  AORTIC VALVE LVOT Vmax:         151.00 cm/s LVOT Vmean:        108.000 cm/s LVOT VTI:          0.280 m AI PHT:            325 msec AR Vena Contracta: 0.30 cm  AORTA Ao Root diam: 3.70 cm Ao Asc diam:  3.70 cm MITRAL VALVE                TRICUSPID VALVE MV Area (PHT): 4.52 cm     TR Peak grad:   49.0 mmHg MV Decel Time: 168 msec     TR Vmax:        350.00 cm/s MR PISA:        0.25 cm MR PISA Radius: 0.20 cm     SHUNTS MV E velocity: 106.00 cm/s  Systemic VTI:  0.28 m MV A velocity: 54.70 cm/s    Systemic Diam: 2.10 cm MV E/A ratio:  1.94 Mihai Croitoru MD Electronically signed by Sanda Klein MD Signature Date/Time: 09/28/2021/10:40:15 AM    Final      Scheduled Meds:  amLODipine  10 mg Oral Daily   atorvastatin  20 mg Oral Daily   cloNIDine  0.2 mg Oral BID   clopidogrel  75 mg Oral Daily   furosemide  80 mg Intravenous Once   heparin injection (subcutaneous)  5,000 Units Subcutaneous Q8H   insulin aspart  0-9 Units Subcutaneous Q4H   isosorbide-hydrALAZINE  1 tablet Oral TID   metoprolol succinate  100 mg Oral Daily   sodium chloride flush  3 mL Intravenous Q12H   Continuous Infusions:  sodium chloride     cefTRIAXone (ROCEPHIN)  IV 1 g (09/28/21 0015)     LOS: 2 days    Time spent: 46min    Domenic Polite, MD [facility]   09/28/2021, 11:59 AM

## 2021-09-28 NOTE — Assessment & Plan Note (Addendum)
Patient initially required non invasive mechanical ventilation, with aggressive medical therapy his volume status improved and he was transitioned to nasal cannula and then to room air.  At the time of his discharge his oxygenation is 91% to 94% on room air.

## 2021-09-28 NOTE — Assessment & Plan Note (Addendum)
Patient has been on oral cephalexin.  

## 2021-09-28 NOTE — Assessment & Plan Note (Addendum)
No signs of urinary retention, plan to follow up as outpatient.

## 2021-09-28 NOTE — Progress Notes (Signed)
Heart Failure Navigator Progress Note  Assessed for Heart & Vascular TOC clinic readiness.  Patient does not meet criteria due to CKF V, SCr >4, nephrology following, no HD at present. Pt has had 2 admissions related to HF within 6 months.  Navigator available for educational resources.   Pricilla Holm, MSN, RN Heart Failure Nurse Navigator [phone]

## 2021-09-28 NOTE — Evaluation (Signed)
Occupational Therapy Evaluation Patient Details Name: Austin [name] MRN: [id number] DOB: [birth year] Today's Date: 09/28/2021   History of Present Illness Pt is an 87 y.o. male who presented 09/26/21 with SOB and edema. Pt admitted with acute on chronic diastolic CHF, progressive CKD 5, and pulmonary edema. PMH: diastolic CHF, DM2 HTN, HLD, prostate CA hx   Clinical Impression   PT admitted with acute CHF progression. Pt currently with functional limitiations due to the deficits listed below (see OT problem list). Pt currently requires increased time for all adls due to fatigue and rest break. Next session to further educate on energy conservation techniques.  Pt will benefit from skilled OT to increase their independence and safety with adls and balance to allow discharge home.       Recommendations for follow up therapy are one component of a multi-disciplinary discharge planning process, led by the attending physician.  Recommendations may be updated based on patient status, additional functional criteria and insurance authorization.   Follow Up Recommendations  No OT follow up    Assistance Recommended at Discharge None  Patient can return home with the following      Functional Status Assessment  Patient has had a recent decline in their functional status and demonstrates the ability to make significant improvements in function in a reasonable and predictable amount of time.  Equipment Recommendations  None recommended by OT    Recommendations for Other Services       Precautions / Restrictions Precautions Precautions: Fall;Other (comment) Precaution Comments: monitor SpO2 Restrictions Weight Bearing Restrictions: No      Mobility Bed Mobility Overal bed mobility: Modified Independent             General bed mobility comments: Extra time and use of bed rails, HOB elevated.    Transfers Overall transfer level: Needs assistance Equipment used: None Transfers: Sit  to/from Stand Sit to Stand: Supervision           General transfer comment: Supervision for safety, no LOB.      Balance Overall balance assessment: Mild deficits observed, not formally tested                                         ADL either performed or assessed with clinical judgement   ADL Overall ADL's : Needs assistance/impaired     Grooming: Oral care;Min guard;Standing Grooming Details (indicate cue type and reason): leaning on counter             Lower Body Dressing: Min guard;Sitting/lateral leans Lower Body Dressing Details (indicate cue type and reason): increased time, pt needs rest break between don of shoes with mod cues to figure 4 cross.   Toilet Transfer Details (indicate cue type and reason): need for prima fit at this time due to incontinence. pt states i can't tell when its always coming         Functional mobility during ADLs: Min guard General ADL Comments: pt requires oxygen and increased time due to fatigue     Vision Baseline Vision/History: 1 Wears glasses Ability to See in Adequate Light: 0 Adequate       Perception     Praxis      Pertinent Vitals/Pain Pain Assessment Pain Assessment: No/denies pain Faces Pain Scale: No hurt     Hand Dominance Right   Extremity/Trunk Assessment Upper Extremity Assessment Upper Extremity Assessment: Overall [facility]  for tasks assessed   Lower Extremity Assessment Lower Extremity Assessment: Defer to PT evaluation   Cervical / Trunk Assessment Cervical / Trunk Assessment: Normal   Communication Communication Communication: No difficulties   Cognition Arousal/Alertness: Awake/alert Behavior During Therapy: WFL for tasks assessed/performed Overall Cognitive Status: Within Functional Limits for tasks assessed                                       General Comments  87% RA and sustained >90% with 2L    Exercises     Shoulder Instructions      Home  Living Family/patient expects to be discharged to:: Private residence Living Arrangements: Spouse/significant other Available Help at Discharge: Family;Available 24 hours/day Type of Home: House Home Access: Stairs to enter [employer] of Steps: 3-4 Entrance Stairs-Rails: Left (ascending) Home Layout: Multi-level;Bed/bath upstairs;1/2 bath on main level Alternate Level Stairs-Number of Steps: flight Alternate Level Stairs-Rails: Left;Right (L entire way ascending, R partial way ascending) Bathroom Shower/Tub: Teacher, early years/pre: Standard     Home Equipment: BSC/3in1   Additional Comments: Not on O2 at home      Prior Functioning/Environment Prior Level of Function : Independent/Modified Independent;Driving             Mobility Comments: Does not use an AD. Denies any falls.          OT Problem List: Decreased activity tolerance;Impaired balance (sitting and/or standing);Cardiopulmonary status limiting activity      OT Treatment/Interventions: Self-care/ADL training;Therapeutic exercise;Energy conservation;Therapeutic activities;Patient/family education;Balance training    OT Goals(Current goals can be found in the care plan section) Acute Rehab OT Goals Patient Stated Goal: to be able to do more/ enjoys reading "patterson books" OT Goal Formulation: With patient Time For Goal Achievement: 10/12/21 Potential to Achieve Goals: Good  OT Frequency: Min 2X/week    Co-evaluation PT/OT/SLP Co-Evaluation/Treatment: Yes Reason for Co-Treatment: For patient/therapist safety;To address functional/ADL transfers PT goals addressed during session: Mobility/safety with mobility;Balance OT goals addressed during session: ADL's and self-care;Proper use of Adaptive equipment and DME;Strengthening/ROM      AM-PAC OT "6 Clicks" Daily Activity     Outcome Measure Help from another person eating meals?: None Help from another person taking care of personal  grooming?: None Help from another person toileting, which includes using toliet, bedpan, or urinal?: None Help from another person bathing (including washing, rinsing, drying)?: None Help from another person to put on and taking off regular upper body clothing?: None Help from another person to put on and taking off regular lower body clothing?: A Little 6 Click Score: 23   End of Session Equipment Utilized During Treatment: Oxygen Nurse Communication: Mobility status;Precautions  Activity Tolerance: Patient tolerated treatment well Patient left: Other (comment) (up with PT to continue ambulation)  OT Visit Diagnosis: Unsteadiness on feet (R26.81);Muscle weakness (generalized) (M62.81)                Time: 2094-7096 OT Time Calculation (min): 18 min Charges:  OT General Charges $OT Visit: 1 Visit OT Evaluation $OT Eval Moderate Complexity: 1 Mod   Brynn, OTR/L  Acute Rehabilitation Services Pager: [phone] Office: [phone] .   Jeri Modena 09/28/2021, 3:21 PM

## 2021-09-28 NOTE — Assessment & Plan Note (Addendum)
His capillary glucose remained well controlled during his hospitalization. At home will hold on oral hypoglycemic agents with close follow up. In the setting of worsening renal function high risk of hypoglycemia.

## 2021-09-28 NOTE — Progress Notes (Addendum)
[facility] Renal Consultation Note  Requesting MD: Toy Baker Indication for Consultation: CKDV  Subjective: Austin [name] is a 87 y.o. male with history of HFpEF last echo 06/2021 with EF 55-60% and grade I DD, diet controled DM2, CKDV, prostate cancer presents for worsening dyspnea and admitted for CHF exacerbation. Nephrology consulted in setting of advanced renal disease, kidney function has been stable no urgent indication for dialysis.   Patient states he is feeling well this morning. Breathing comfortably on [place]. Denies further episodes of coughing. Feels LE edema is also improving. Still feels somewhat bloated in the abdomen.   Creatinine  Date/Time Value Ref Range Status  12/02/2019 12:00 AM 2.9 (A) 0.6 - 1.3 Final  08/19/2019 11:07 AM 2.81 (H) 0.61 - 1.24 mg/dL Final   Creat  Date/Time Value Ref Range Status  03/03/2020 02:54 PM 3.59 (H) 0.70 - 1.11 mg/dL Final    Comment:    For patients >70 years of age, the reference limit for Creatinine is approximately 13% higher for people identified as African-American. .    Creatinine, Ser  Date/Time Value Ref Range Status  09/28/2021 02:11 AM 4.33 (H) 0.61 - 1.24 mg/dL Final  09/27/2021 08:00 AM 4.52 (H) 0.61 - 1.24 mg/dL Final  09/26/2021 09:17 PM 4.33 (H) 0.61 - 1.24 mg/dL Final  07/21/2021 02:03 PM 5.06 (HH) 0.40 - 1.50 mg/dL Final  07/14/2021 01:45 AM 4.77 (H) 0.61 - 1.24 mg/dL Final  07/13/2021 03:12 AM 4.81 (H) 0.61 - 1.24 mg/dL Final  07/12/2021 02:30 AM 4.86 (H) 0.61 - 1.24 mg/dL Final  07/11/2021 02:35 AM 4.77 (H) 0.61 - 1.24 mg/dL Final  07/10/2021 02:46 AM 4.52 (H) 0.61 - 1.24 mg/dL Final  07/09/2021 09:08 AM 4.04 (H) 0.61 - 1.24 mg/dL Final  07/08/2021 11:58 PM 4.02 (H) 0.61 - 1.24 mg/dL Final  06/11/2021 10:57 AM 3.70 (H) 0.40 - 1.50 mg/dL Final  05/27/2021 11:19 AM 4.07 (H) 0.40 - 1.50 mg/dL Final  10/12/2020 03:39 AM 4.78 (H) 0.61 - 1.24 mg/dL Final  10/11/2020 05:04 AM 4.64 (H) 0.61 -  1.24 mg/dL Final  10/10/2020 06:53 AM 5.01 (H) 0.61 - 1.24 mg/dL Final  10/09/2020 08:00 PM 5.38 (H) 0.61 - 1.24 mg/dL Final  10/09/2020 03:33 PM 5.86 (H) 0.61 - 1.24 mg/dL Final  10/09/2020 10:35 AM 6.24 (H) 0.61 - 1.24 mg/dL Final  09/23/2020 09:45 AM 5.63 (HH) 0.40 - 1.50 mg/dL Final  09/20/2020 02:38 AM 4.22 (H) 0.61 - 1.24 mg/dL Final  09/19/2020 05:01 AM 3.94 (H) 0.61 - 1.24 mg/dL Final  09/18/2020 05:27 AM 3.70 (H) 0.61 - 1.24 mg/dL Final  09/17/2020 01:59 AM 3.60 (H) 0.61 - 1.24 mg/dL Final  09/16/2020 07:59 AM 3.51 (H) 0.61 - 1.24 mg/dL Final  02/26/2020 07:24 AM 3.35 (H) 0.61 - 1.24 mg/dL Final  02/25/2020 03:21 AM 3.12 (H) 0.61 - 1.24 mg/dL Final  02/24/2020 03:39 AM 2.92 (H) 0.61 - 1.24 mg/dL Final  02/23/2020 03:59 PM 3.09 (H) 0.61 - 1.24 mg/dL Final  11/30/2019 08:00 PM 3.08 (H) 0.61 - 1.24 mg/dL Final  11/28/2019 05:29 AM 2.91 (H) 0.61 - 1.24 mg/dL Final  08/14/2019 10:28 AM 2.73 (H) 0.40 - 1.50 mg/dL Final  08/06/2019 05:43 AM 3.00 (H) 0.61 - 1.24 mg/dL Final  08/05/2019 05:36 AM 2.95 (H) 0.61 - 1.24 mg/dL Final  08/04/2019 05:08 AM 2.67 (H) 0.61 - 1.24 mg/dL Final  05/01/2019 09:53 AM 3.15 (H) 0.40 - 1.50 mg/dL Final  04/03/2019 01:51 PM 3.46 (H) 0.61 - 1.24 mg/dL Final  03/29/2019 11:51 AM 3.30 (H) 0.61 - 1.24 mg/dL Final  03/29/2019 04:07 AM 3.38 (H) 0.61 - 1.24 mg/dL Final  03/28/2019 07:26 PM 3.30 (H) 0.61 - 1.24 mg/dL Final  03/28/2019 05:40 AM 2.59 (H) 0.61 - 1.24 mg/dL Final  03/27/2019 08:30 PM 2.94 (H) 0.61 - 1.24 mg/dL Final  01/14/2019 01:15 PM 2.88 (H) 0.61 - 1.24 mg/dL Final  07/25/2018 11:24 AM 2.49 (H) 0.40 - 1.50 mg/dL Final  11/20/2017 10:27 AM 2.08 (H) 0.40 - 1.50 mg/dL Final  01/09/2017 10:16 AM 1.80 (H) 0.40 - 1.50 mg/dL Final  12/12/2016 01:39 PM 2.06 (H) 0.40 - 1.50 mg/dL Final  11/09/2016 02:26 PM 2.32 (H) 0.40 - 1.50 mg/dL Final  11/05/2016 06:12 AM 1.45 (H) 0.61 - 1.24 mg/dL Final  11/04/2016 12:41 AM 1.90 (H) 0.61 - 1.24 mg/dL Final   11/03/2016 09:15 AM 2.29 (H) 0.61 - 1.24 mg/dL Final  09/09/2016 12:13 PM 2.20 (H) 0.40 - 1.50 mg/dL Final     PMHx:   Past Medical History:  Diagnosis Date   Acute diastolic CHF (congestive heart failure) ([place]) 09/17/2020   Acute exacerbation of CHF (congestive heart failure) ([place]) 08/04/2019   ANEMIA DUE TO CHRONIC BLOOD LOSS 03/13/2007   CAROTID ARTERY STENOSIS 05/10/2010   CHF (congestive heart failure) ([place])    DIABETES MELLITUS, TYPE II 09/19/2007   DISEASE, CEREBROVASCULAR NEC 03/05/2007   GERD 03/13/2007   HYPERLIPIDEMIA 03/05/2007   HYPERTENSION 03/05/2007   HYPOKALEMIA 11/09/2009   KNEE PAIN, RIGHT 11/09/2009   PROSTATE CANCER, HX OF 03/05/2007   RENAL DISEASE, CHRONIC 02/03/2009    Past Surgical History:  Procedure Laterality Date   CAROTID ARTERY ANGIOPLASTY Right Oct. 10, 2001   ESOPHAGOGASTRODUODENOSCOPY (EGD) WITH PROPOFOL N/A 11/04/2016   Procedure: ESOPHAGOGASTRODUODENOSCOPY (EGD) WITH PROPOFOL;  Surgeon: Otis Brace, MD;  Location: [facility];  Service: Gastroenterology;  Laterality: N/A;   LAPAROSCOPIC APPENDECTOMY  02/20/2012   Procedure: APPENDECTOMY LAPAROSCOPIC;  Surgeon: Stark Klein, MD;  Location: [facility];  Service: General;  Laterality: N/A;   PROSTATE SURGERY     prostatectomy    Family Hx:  Family History  Problem Relation Age of Onset   Hypertension Mother    Cancer Father        Mesothelioma    Stomach cancer Brother    Cancer Brother    Cancer - Cervical Brother    Cancer Brother    Diabetes Brother    Esophageal cancer Neg Hx    Colon cancer Neg Hx    Pancreatic cancer Neg Hx     Social History:  reports that he quit smoking about 47 years ago. His smoking use included cigarettes. He has a 10.00 pack-year smoking history. He has never used smokeless tobacco. He reports that he does not drink alcohol and does not use drugs.  Allergies:  Allergies  Allergen Reactions   Aspirin Other (See Comments)    High doses causes stomach ulcer and  bleeding    Medications: Prior to Admission medications   Medication Sig Start Date End Date Taking? Authorizing Provider  ACCU-CHEK GUIDE test strip USED TO CHECK BLOOD GLUCOSE TWICE A DAY OR AS NEEDED Patient taking differently: in the morning and at bedtime. 10/20/20  Yes Nafziger, Tommi Rumps, NP  Accu-Chek Softclix Lancets lancets USED TO CHECK BLOOD GLUCOSE TWICE A DAY OR AS NEEDED Patient taking differently: in the morning and at bedtime. 12/25/20  Yes Nafziger, Tommi Rumps, NP  acetaminophen (TYLENOL) 325 MG tablet Take 325 mg by mouth 4 (four) times  daily.   Yes [provider]  albuterol (VENTOLIN HFA) 108 (90 Base) MCG/ACT inhaler TAKE 2 PUFFS BY MOUTH EVERY 6 HOURS AS NEEDED FOR WHEEZE OR SHORTNESS OF BREATH Patient taking differently: Inhale 2 puffs into the lungs every 6 (six) hours as needed for shortness of breath or wheezing. 08/04/21  Yes Collene Gobble, MD  amLODipine (NORVASC) 10 MG tablet TAKE 1 TABLET BY MOUTH EVERY DAY Patient taking differently: Take 10 mg by mouth daily. 07/13/21  Yes Nafziger, Tommi Rumps, NP  atorvastatin (LIPITOR) 20 MG tablet TAKE 1 TABLET BY MOUTH EVERY DAY Patient taking differently: Take 20 mg by mouth daily. 12/29/20  Yes Nafziger, Tommi Rumps, NP  bisacodyl (DULCOLAX) 10 MG suppository Place 1 suppository (10 mg total) rectally daily as needed for mild constipation. 07/13/21  Yes Geradine Girt, DO  Blood Glucose Monitoring Suppl (ACCU-CHEK AVIVA PLUS) w/Device KIT Used to check blood glucose 2 times a day or PRN Patient taking differently: in the morning and at bedtime. 09/04/19  Yes Nafziger, Tommi Rumps, NP  cholecalciferol (VITAMIN D3) 25 MCG (1000 UNIT) tablet Take 1,000 Units by mouth daily.   Yes [provider]  cloNIDine (CATAPRES) 0.3 MG tablet Take 1 tablet (0.3 mg total) by mouth 3 (three) times daily. 09/20/20  Yes Harold Hedge, MD  clopidogrel (PLAVIX) 75 MG tablet TAKE 1 TABLET BY MOUTH EVERY DAY Patient taking differently: Take 75 mg by mouth  daily. 07/13/21  Yes Nafziger, Tommi Rumps, NP  doxazosin (CARDURA) 2 MG tablet Take 2 mg by mouth at bedtime.  12/28/17  Yes [provider]  esomeprazole (NEXIUM) 40 MG capsule TAKE 1 CAPSULE BY MOUTH EVERY DAY Patient taking differently: 40 mg daily. 05/13/21  Yes Nafziger, Tommi Rumps, NP  glipiZIDE (GLUCOTROL XL) 5 MG 24 hr tablet TAKE 1 TABLET BY MOUTH EVERY DAY Patient taking differently: Take 5 mg by mouth daily with breakfast. 09/16/21  Yes Nafziger, Tommi Rumps, NP  isosorbide-hydrALAZINE (BIDIL) 20-37.5 MG tablet Take 1 tablet by mouth 3 (three) times daily.   Yes [provider]  KLOR-CON M10 10 MEQ tablet Take 2 tablets (20 mEq total) by mouth 2 (two) times daily. Patient taking differently: Take 10 mEq by mouth 2 (two) times daily. 09/20/20 09/26/21 Yes Harold Hedge, MD  lactulose ([place]) 10 GM/15ML solution TAKE 15 ML BY MOUTH 2 TIMES DAILY AS NEEDED FOR MILD CONSTIPATION. Patient taking differently: Take 10 g by mouth See admin instructions. Every 2-3 days, alternating with Citrucel 03/18/21  Yes Nafziger, Tommi Rumps, NP  methylcellulose (CITRUCEL) oral powder Take 1 packet by mouth See admin instructions. Every 2-3 days,alternating with lactulose   Yes [provider]  metoprolol succinate (TOPROL-XL) 100 MG 24 hr tablet Take 1 tablet (100 mg total) by mouth daily. Take with or immediately following a meal. 09/21/20 09/26/21 Yes Harold Hedge, MD  Propylene Glycol (SYSTANE BALANCE OP) Place 1 drop into both eyes at bedtime.   Yes [provider]  torsemide (DEMADEX) 20 MG tablet Take 20 mg by mouth 2 (two) times daily.   Yes [provider]  triamcinolone cream (KENALOG) 0.1 % Apply 1 application topically daily as needed (dry/irritated skin). 07/05/19  Yes [provider]  hydrocortisone (ANUSOL-HC) 2.5 % rectal cream PLACE 1 APPLICATION RECTALLY 2 TIMES A DAY. Patient taking differently: Place 1 application rectally 2 (two) times daily. 03/31/21   Burchette,  Alinda Sierras, MD  polyethylene glycol (MIRALAX / GLYCOLAX) 17 g packet Take 17 g by mouth daily as needed  for mild constipation. Patient not taking: Reported on 09/26/2021    [provider]  torsemide 40 MG TABS Take 40 mg by mouth daily. Patient not taking: Reported on 09/26/2021 07/14/21   Geradine Girt, DO    Prior to Admission:  Facility-Administered Medications Prior to Admission  Medication Dose Route Frequency Provider Last Rate Last Admin   ammonium lactate (LAC-HYDRIN) 12 % lotion   Topical PRN Felipa Furnace, DPM       Medications Prior to Admission  Medication Sig Dispense Refill Last Dose   ACCU-CHEK GUIDE test strip USED TO CHECK BLOOD GLUCOSE TWICE A DAY OR AS NEEDED (Patient taking differently: in the morning and at bedtime.) 100 strip 1    Accu-Chek Softclix Lancets lancets USED TO CHECK BLOOD GLUCOSE TWICE A DAY OR AS NEEDED (Patient taking differently: in the morning and at bedtime.) 100 each 6    acetaminophen (TYLENOL) 325 MG tablet Take 325 mg by mouth 4 (four) times daily.   09/26/2021   albuterol (VENTOLIN HFA) 108 (90 Base) MCG/ACT inhaler TAKE 2 PUFFS BY MOUTH EVERY 6 HOURS AS NEEDED FOR WHEEZE OR SHORTNESS OF BREATH (Patient taking differently: Inhale 2 puffs into the lungs every 6 (six) hours as needed for shortness of breath or wheezing.) 8.5 each 5 09/26/2021   amLODipine (NORVASC) 10 MG tablet TAKE 1 TABLET BY MOUTH EVERY DAY (Patient taking differently: Take 10 mg by mouth daily.) 90 tablet 1 09/26/2021   atorvastatin (LIPITOR) 20 MG tablet TAKE 1 TABLET BY MOUTH EVERY DAY (Patient taking differently: Take 20 mg by mouth daily.) 90 tablet 3 09/26/2021   bisacodyl (DULCOLAX) 10 MG suppository Place 1 suppository (10 mg total) rectally daily as needed for mild constipation. 12 suppository 0 unk   Blood Glucose Monitoring Suppl (ACCU-CHEK AVIVA PLUS) w/Device KIT Used to check blood glucose 2 times a day or PRN (Patient taking differently: in the morning and at bedtime.) 1  kit 0    cholecalciferol (VITAMIN D3) 25 MCG (1000 UNIT) tablet Take 1,000 Units by mouth daily.   09/26/2021   cloNIDine (CATAPRES) 0.3 MG tablet Take 1 tablet (0.3 mg total) by mouth 3 (three) times daily. 60 tablet 0 09/26/2021   clopidogrel (PLAVIX) 75 MG tablet TAKE 1 TABLET BY MOUTH EVERY DAY (Patient taking differently: Take 75 mg by mouth daily.) 90 tablet 3 09/26/2021   doxazosin (CARDURA) 2 MG tablet Take 2 mg by mouth at bedtime.   3 09/25/2021   esomeprazole (NEXIUM) 40 MG capsule TAKE 1 CAPSULE BY MOUTH EVERY DAY (Patient taking differently: 40 mg daily.) 90 capsule 3 09/26/2021   glipiZIDE (GLUCOTROL XL) 5 MG 24 hr tablet TAKE 1 TABLET BY MOUTH EVERY DAY (Patient taking differently: Take 5 mg by mouth daily with breakfast.) 90 tablet 0 09/26/2021   isosorbide-hydrALAZINE (BIDIL) 20-37.5 MG tablet Take 1 tablet by mouth 3 (three) times daily.   09/26/2021   KLOR-CON M10 10 MEQ tablet Take 2 tablets (20 mEq total) by mouth 2 (two) times daily. (Patient taking differently: Take 10 mEq by mouth 2 (two) times daily.) 120 tablet 0 09/26/2021   lactulose (CHRONULAC) 10 GM/15ML solution TAKE 15 ML BY MOUTH 2 TIMES DAILY AS NEEDED FOR MILD CONSTIPATION. (Patient taking differently: Take 10 g by mouth See admin instructions. Every 2-3 days, alternating with Citrucel) 237 mL 2 09/26/2021   methylcellulose (CITRUCEL) oral powder Take 1 packet by mouth See admin instructions. Every 2-3 days,alternating with lactulose   09/25/2021  metoprolol succinate (TOPROL-XL) 100 MG 24 hr tablet Take 1 tablet (100 mg total) by mouth daily. Take with or immediately following a meal. 30 tablet 0 09/26/2021 at 1130   Propylene Glycol (SYSTANE BALANCE OP) Place 1 drop into both eyes at bedtime.   09/25/2021   torsemide (DEMADEX) 20 MG tablet Take 20 mg by mouth 2 (two) times daily.   09/26/2021   triamcinolone cream (KENALOG) 0.1 % Apply 1 application topically daily as needed (dry/irritated skin).   Past Month   hydrocortisone (ANUSOL-HC)  2.5 % rectal cream PLACE 1 APPLICATION RECTALLY 2 TIMES A DAY. (Patient taking differently: Place 1 application rectally 2 (two) times daily.) 30 g 0    polyethylene glycol (MIRALAX / GLYCOLAX) 17 g packet Take 17 g by mouth daily as needed for mild constipation. (Patient not taking: Reported on 09/26/2021)   Not Taking   torsemide 40 MG TABS Take 40 mg by mouth daily. (Patient not taking: Reported on 09/26/2021) 30 tablet 0 Not Taking    Labs:  Results for orders placed or performed during the hospital encounter of 09/26/21 (from the past 48 hour(s))  Basic metabolic panel     Status: Abnormal   Collection Time: 09/26/21  9:17 PM  Result Value Ref Range   Sodium 144 135 - 145 mmol/L   Potassium 3.7 3.5 - 5.1 mmol/L   Chloride 113 (H) 98 - 111 mmol/L   CO2 18 (L) 22 - 32 mmol/L   Glucose, Bld 181 (H) 70 - 99 mg/dL    Comment: Glucose reference range applies only to samples taken after fasting for at least 8 hours.   BUN 66 (H) 8 - 23 mg/dL   Creatinine, Ser 4.33 (H) 0.61 - 1.24 mg/dL   Calcium 9.1 8.9 - 10.3 mg/dL   GFR, Estimated 13 (L) >60 mL/min    Comment: (NOTE) Calculated using the CKD-EPI Creatinine Equation (2021)    Anion gap 13 5 - 15    Comment: Performed at [facility] [address], [place], [place] [address]  Magnesium     Status: None   Collection Time: 09/26/21  9:17 PM  Result Value Ref Range   Magnesium 2.3 1.7 - 2.4 mg/dL    Comment: Performed at [facility], [place] [address], [place], [place] [address]  Brain natriuretic peptide (order ONLY if patient c/o SOB)     Status: Abnormal   Collection Time: 09/26/21  9:17 PM  Result Value Ref Range   B Natriuretic Peptide 3,670.5 (H) 0.0 - 100.0 pg/mL    Comment: Performed at [facility] [address] [place], [place] [address]  CBC     Status: Abnormal   Collection Time: 09/26/21  9:17 PM  Result Value Ref Range   WBC 10.7 (H) 4.0 - 10.5 K/uL   RBC 3.67 (L) 4.22 - 5.81 MIL/uL   Hemoglobin 9.9  (L) 13.0 - 17.0 g/dL   HCT 31.6 (L) 39.0 - 52.0 %   MCV 86.1 80.0 - 100.0 fL   MCH 27.0 26.0 - 34.0 pg   MCHC 31.3 30.0 - 36.0 g/dL   RDW 17.0 (H) 11.5 - 15.5 %   Platelets 280 150 - 400 K/uL   nRBC 0.0 0.0 - 0.2 %    Comment: Performed at [facility] [address] [place],  [address]  Urinalysis, Routine w reflex microscopic     Status: Abnormal   Collection Time: 09/26/21  9:47  PM  Result Value Ref Range   Color, Urine YELLOW YELLOW   APPearance CLOUDY (A) CLEAR   Specific Gravity, Urine 1.020 1.005 - 1.030   pH 6.0 5.0 - 8.0   Glucose, UA NEGATIVE NEGATIVE mg/dL   Hgb urine dipstick SMALL (A) NEGATIVE   Bilirubin Urine NEGATIVE NEGATIVE   Ketones, ur NEGATIVE NEGATIVE mg/dL   Protein, ur >300 (A) NEGATIVE mg/dL   Nitrite POSITIVE (A) NEGATIVE   Leukocytes,Ua NEGATIVE NEGATIVE    Comment: Performed at [facility] [address], [place], [place] [address]  Urinalysis, Microscopic (reflex)     Status: Abnormal   Collection Time: 09/26/21  9:47 PM  Result Value Ref Range   RBC / HPF 0-5 0 - 5 RBC/hpf   WBC, UA 11-20 0 - 5 WBC/hpf   Bacteria, UA MANY (A) NONE SEEN   Squamous Epithelial / LPF 0-5 0 - 5   Mucus PRESENT     Comment: Performed at [facility], [place] [address] [place], [place] [address]  CBG monitoring, ED     Status: Abnormal   Collection Time: 09/27/21 12:12 AM  Result Value Ref Range   Glucose-Capillary 124 (H) 70 - 99 mg/dL    Comment: Glucose reference range applies only to samples taken after fasting for at least 8 hours.  Urine Culture     Status: Abnormal   Collection Time: 09/27/21  1:17 AM   Specimen: Urine, Clean Catch  Result Value Ref Range   Specimen Description URINE, CLEAN CATCH    Special Requests NONE    Culture (A)     <10,000 COLONIES/mL INSIGNIFICANT GROWTH Performed at [facility], [address] [place], [place] [address]    Report Status 09/28/2021 FINAL   Troponin I (High Sensitivity)      Status: Abnormal   Collection Time: 09/27/21  1:19 AM  Result Value Ref Range   Troponin I (High Sensitivity) 137 (HH) <18 ng/L    Comment: CRITICAL RESULT CALLED TO, READ BACK BY AND VERIFIED WITH: CHRIS CHRISCO RN 09/27/21 0224 M KOROLESKI (NOTE) Elevated high sensitivity troponin I (hsTnI) values and significant  changes across serial measurements may suggest ACS but many other  chronic and acute conditions are known to elevate hsTnI results.  Refer to the Links section for chest pain algorithms and additional  guidance. Performed at [facility], [place] [address] [place], [place] [address]   Vitamin B12     Status: Abnormal   Collection Time: 09/27/21  1:19 AM  Result Value Ref Range   Vitamin B-12 158 (L) 180 - 914 pg/mL    Comment: (NOTE) This assay is not validated for testing neonatal or myeloproliferative syndrome specimens for Vitamin B12 levels. Performed at [facility], [place] [address], [place], [place] [address]   Folate     Status: None   Collection Time: 09/27/21  1:19 AM  Result Value Ref Range   Folate 19.9 >5.9 ng/mL    Comment: Performed at [facility] [address], [place], [place] [address]  Iron and TIBC     Status: Abnormal   Collection Time: 09/27/21  1:19 AM  Result Value Ref Range   Iron 45 45 - 182 ug/dL   TIBC 239 (L) 250 - 450 ug/dL   Saturation Ratios 19 17.9 - 39.5 %   UIBC 194 ug/dL    Comment: Performed at [facility], [place] [address]  [place], [place] [address]  Ferritin     Status: None   Collection Time: 09/27/21  1:19 AM  Result Value Ref Range   Ferritin 59 24 - 336 ng/mL    Comment: Performed at [facility] [address] [place], [place] [address]  Reticulocytes     Status: Abnormal   Collection Time: 09/27/21  1:19 AM  Result Value Ref Range   Retic Ct Pct 0.9 0.4 - 3.1 %   RBC. 3.31 (L) 4.22 - 5.81 MIL/uL   Retic Count, Absolute 29.8 19.0 - 186.0 K/uL   Immature Retic Fract 12.6 2.3 - 15.9 %     Comment: Performed at [facility] [address], [place], [place] [address]  Hemoglobin A1c     Status: Abnormal   Collection Time: 09/27/21  1:19 AM  Result Value Ref Range   Hgb A1c MFr Bld 6.1 (H) 4.8 - 5.6 %    Comment: (NOTE) Pre diabetes:          5.7%-6.4%  Diabetes:              >6.4%  Glycemic control for   <7.0% adults with diabetes    Mean Plasma Glucose 128.37 mg/dL    Comment: Performed at [facility] [address], [place], [place] [address]  Differential     Status: Abnormal   Collection Time: 09/27/21  1:19 AM  Result Value Ref Range   Neutrophils Relative % 89 %   Neutro Abs 8.2 (H) 1.7 - 7.7 K/uL   Lymphocytes Relative 5 %   Lymphs Abs 0.4 (L) 0.7 - 4.0 K/uL   Monocytes Relative 5 %   Monocytes Absolute 0.4 0.1 - 1.0 K/uL   Eosinophils Relative 0 %   Eosinophils Absolute 0.0 0.0 - 0.5 K/uL   Basophils Relative 0 %   Basophils Absolute 0.0 0.0 - 0.1 K/uL   Immature Granulocytes 1 %   Abs Immature Granulocytes 0.04 0.00 - 0.07 K/uL    Comment: Performed at [facility] [address] [place], [place] [address]  Hepatic function panel     Status: Abnormal   Collection Time: 09/27/21  1:19 AM  Result Value Ref Range   Total Protein 7.1 6.5 - 8.1 g/dL   Albumin 3.4 (L) 3.5 - 5.0 g/dL   AST 13 (L) 15 - 41 U/L   ALT 13 0 - 44 U/L   Alkaline Phosphatase 81 38 - 126 U/L   Total Bilirubin 0.4 0.3 - 1.2 mg/dL   Bilirubin, Direct <0.1 0.0 - 0.2 mg/dL   Indirect Bilirubin NOT CALCULATED 0.3 - 0.9 mg/dL    Comment: Performed at [facility] [address], [place], [place] [address]  Phosphorus     Status: None   Collection Time: 09/27/21  1:19 AM  Result Value Ref Range   Phosphorus 4.1 2.5 - 4.6 mg/dL    Comment: Performed at [facility] [address], [place], [place] [address]  TSH     Status: None   Collection Time: 09/27/21  1:19 AM  Result Value Ref Range   TSH 0.620 0.350 - 4.500 uIU/mL    Comment: Performed  by a 3rd Generation assay with a functional sensitivity of <=0.01 uIU/mL. Performed at [facility], [place] [address] [place], [place] [address]   I-Stat venous blood gas, ED     Status: Abnormal   Collection Time: 09/27/21  1:29 AM  Result Value Ref Range   pH, Ven 7.450 (H) 7.250 - 7.430   pCO2, Ven 30.0 (L) 44.0 - 60.0 mmHg   pO2, Ven 126.0 (H) 32.0 - 45.0 mmHg   Bicarbonate 20.8 20.0 - 28.0 mmol/L   TCO2 22 22 - 32 mmol/L   O2 Saturation 99.0 %   Acid-base deficit 3.0 (H) 0.0 - 2.0 mmol/L   Sodium 148 (H) 135 - 145 mmol/L   Potassium 3.7 3.5 - 5.1 mmol/L   Calcium, Ion 1.15 1.15 - 1.40 mmol/L   HCT 27.0 (L) 39.0 - 52.0 %   Hemoglobin 9.2 (L) 13.0 - 17.0 g/dL   Sample type VENOUS   CBG monitoring, ED     Status: Abnormal   Collection Time: 09/27/21  4:38 AM  Result Value Ref Range   Glucose-Capillary 108 (H) 70 - 99 mg/dL    Comment: Glucose reference range applies only to samples taken after fasting for at least 8 hours.  Resp Panel by RT-PCR (Flu A&B, Covid) Nasopharyngeal Swab     Status: None   Collection Time: 09/27/21  4:56 AM   Specimen: Nasopharyngeal Swab; Nasopharyngeal(NP) swabs in vial transport medium  Result Value Ref Range   SARS Coronavirus 2 by RT PCR NEGATIVE NEGATIVE    Comment: (NOTE) SARS-CoV-2 target nucleic acids are NOT DETECTED.  The SARS-CoV-2 RNA is generally detectable in upper respiratory specimens during the acute phase of infection. The lowest concentration of SARS-CoV-2 viral copies this assay can detect is 138 copies/mL. A negative result does not preclude SARS-Cov-2 infection and should not be used as the sole basis for treatment or other patient management decisions. A negative result may occur with  improper specimen collection/handling, submission of specimen other than nasopharyngeal swab, presence of viral mutation(s) within the areas targeted by this assay, and inadequate number of viral copies(<138 copies/mL). A negative result  must be combined with clinical observations, patient history, and epidemiological information. The expected result is Negative.  Fact Sheet for Patients:  EntrepreneurPulse.com.au  Fact Sheet for Healthcare Providers:  IncredibleEmployment.be  This test is no t yet approved or cleared by the [place] FDA and  has been authorized for detection and/or diagnosis of SARS-CoV-2 by FDA under an Emergency Use Authorization (EUA). This EUA will remain  in effect (meaning this test can be used) for the duration of the COVID-19 declaration under Section 564(b)(1) of the Act, 21 U.S.C.section 360bbb-3(b)(1), unless the authorization is terminated  or revoked sooner.       Influenza A by PCR NEGATIVE NEGATIVE   Influenza B by PCR NEGATIVE NEGATIVE    Comment: (NOTE) The Xpert Xpress SARS-CoV-2/FLU/RSV plus assay is intended as an aid in the diagnosis of influenza from Nasopharyngeal swab specimens and should not be used as a sole basis for treatment. Nasal washings and aspirates are unacceptable for Xpert Xpress SARS-CoV-2/FLU/RSV testing.  Fact Sheet for Patients: EntrepreneurPulse.com.au  Fact Sheet for Healthcare Providers: IncredibleEmployment.be  This test is not yet approved or cleared by the [place] FDA and has been authorized for detection and/or diagnosis of SARS-CoV-2 by FDA under an Emergency Use Authorization (EUA). This EUA will remain in effect (meaning this test can be used) for the duration of the COVID-19 declaration under Section 564(b)(1) of the Act, 21 U.S.C. section 360bbb-3(b)(1), unless the authorization is terminated or revoked.  Performed at [facility], [place] [address], [place], [place] [address]   Magnesium     Status: None   Collection Time:  09/27/21  8:00 AM  Result Value Ref Range   Magnesium 2.4 1.7 - 2.4 mg/dL    Comment: Performed at [facility],  [place] [address] [place], [place] [address]  Phosphorus     Status: None   Collection Time: 09/27/21  8:00 AM  Result Value Ref Range   Phosphorus 4.2 2.5 - 4.6 mg/dL    Comment: Performed at [facility], [place] [address] [place], [place] [address]  CBC WITH DIFFERENTIAL     Status: Abnormal   Collection Time: 09/27/21  8:00 AM  Result Value Ref Range   WBC 11.5 (H) 4.0 - 10.5 K/uL   RBC 3.55 (L) 4.22 - 5.81 MIL/uL   Hemoglobin 9.5 (L) 13.0 - 17.0 g/dL   HCT 30.8 (L) 39.0 - 52.0 %   MCV 86.8 80.0 - 100.0 fL   MCH 26.8 26.0 - 34.0 pg   MCHC 30.8 30.0 - 36.0 g/dL   RDW 17.0 (H) 11.5 - 15.5 %   Platelets 244 150 - 400 K/uL   nRBC 0.0 0.0 - 0.2 %   Neutrophils Relative % 90 %   Neutro Abs 10.3 (H) 1.7 - 7.7 K/uL   Lymphocytes Relative 4 %   Lymphs Abs 0.4 (L) 0.7 - 4.0 K/uL   Monocytes Relative 6 %   Monocytes Absolute 0.7 0.1 - 1.0 K/uL   Eosinophils Relative 0 %   Eosinophils Absolute 0.0 0.0 - 0.5 K/uL   Basophils Relative 0 %   Basophils Absolute 0.0 0.0 - 0.1 K/uL   Immature Granulocytes 0 %   Abs Immature Granulocytes 0.05 0.00 - 0.07 K/uL    Comment: Performed at [facility], [place] [address], [place], [place] [address]  TSH     Status: None   Collection Time: 09/27/21  8:00 AM  Result Value Ref Range   TSH 0.545 0.350 - 4.500 uIU/mL    Comment: Performed by a 3rd Generation assay with a functional sensitivity of <=0.01 uIU/mL. Performed at [facility], [place] [address] [place], [place] [address]   Comprehensive metabolic panel     Status: Abnormal   Collection Time: 09/27/21  8:00 AM  Result Value Ref Range   Sodium 147 (H) 135 - 145 mmol/L   Potassium 3.4 (L) 3.5 - 5.1 mmol/L   Chloride 111 98 - 111 mmol/L   CO2 22 22 - 32 mmol/L   Glucose, Bld 122 (H) 70 - 99 mg/dL    Comment: Glucose reference range applies only to samples taken after fasting for at least 8 hours.   BUN 64 (H) 8 - 23 mg/dL   Creatinine, Ser 4.52 (H) 0.61 - 1.24 mg/dL   Calcium 8.9  8.9 - 10.3 mg/dL   Total Protein 6.8 6.5 - 8.1 g/dL   Albumin 3.6 3.5 - 5.0 g/dL   AST 12 (L) 15 - 41 U/L   ALT 13 0 - 44 U/L   Alkaline Phosphatase 84 38 - 126 U/L   Total Bilirubin 0.6 0.3 - 1.2 mg/dL   GFR, Estimated 12 (L) >60 mL/min    Comment: (NOTE) Calculated using the CKD-EPI Creatinine Equation (2021)    Anion gap 14 5 - 15    Comment: Performed at [facility] [address] [place], [place] [address]  CBG monitoring, ED     Status: Abnormal   Collection Time: 09/27/21  8:41 AM  Result Value Ref Range   Glucose-Capillary 119 (H)  70 - 99 mg/dL    Comment: Glucose reference range applies only to samples taken after fasting for at least 8 hours.  CBG monitoring, ED     Status: None   Collection Time: 09/27/21 12:35 PM  Result Value Ref Range   Glucose-Capillary 72 70 - 99 mg/dL    Comment: Glucose reference range applies only to samples taken after fasting for at least 8 hours.  Glucose, capillary     Status: Abnormal   Collection Time: 09/27/21  4:11 PM  Result Value Ref Range   Glucose-Capillary 106 (H) 70 - 99 mg/dL    Comment: Glucose reference range applies only to samples taken after fasting for at least 8 hours.  Glucose, capillary     Status: Abnormal   Collection Time: 09/27/21  8:22 PM  Result Value Ref Range   Glucose-Capillary 102 (H) 70 - 99 mg/dL    Comment: Glucose reference range applies only to samples taken after fasting for at least 8 hours.  Glucose, capillary     Status: Abnormal   Collection Time: 09/28/21 12:03 AM  Result Value Ref Range   Glucose-Capillary 110 (H) 70 - 99 mg/dL    Comment: Glucose reference range applies only to samples taken after fasting for at least 8 hours.  CBC     Status: Abnormal   Collection Time: 09/28/21  2:11 AM  Result Value Ref Range   WBC 9.3 4.0 - 10.5 K/uL   RBC 3.41 (L) 4.22 - 5.81 MIL/uL   Hemoglobin 9.2 (L) 13.0 - 17.0 g/dL   HCT 28.7 (L) 39.0 - 52.0 %   MCV 84.2 80.0 - 100.0 fL   MCH 27.0 26.0 -  34.0 pg   MCHC 32.1 30.0 - 36.0 g/dL   RDW 16.5 (H) 11.5 - 15.5 %   Platelets 226 150 - 400 K/uL   nRBC 0.0 0.0 - 0.2 %    Comment: Performed at [facility] [address], [place], [place] [address]  Comprehensive metabolic panel     Status: Abnormal   Collection Time: 09/28/21  2:11 AM  Result Value Ref Range   Sodium 147 (H) 135 - 145 mmol/L   Potassium 3.4 (L) 3.5 - 5.1 mmol/L   Chloride 114 (H) 98 - 111 mmol/L   CO2 21 (L) 22 - 32 mmol/L   Glucose, Bld 103 (H) 70 - 99 mg/dL    Comment: Glucose reference range applies only to samples taken after fasting for at least 8 hours.   BUN 71 (H) 8 - 23 mg/dL   Creatinine, Ser 4.33 (H) 0.61 - 1.24 mg/dL   Calcium 8.9 8.9 - 10.3 mg/dL   Total Protein 6.6 6.5 - 8.1 g/dL   Albumin 3.6 3.5 - 5.0 g/dL   AST 14 (L) 15 - 41 U/L   ALT 10 0 - 44 U/L   Alkaline Phosphatase 77 38 - 126 U/L   Total Bilirubin 0.4 0.3 - 1.2 mg/dL   GFR, Estimated 13 (L) >60 mL/min    Comment: (NOTE) Calculated using the CKD-EPI Creatinine Equation (2021)    Anion gap 12 5 - 15    Comment: Performed at [facility], [place] [address] [place], [place] [address]  Glucose, capillary     Status: None   Collection Time: 09/28/21  3:49 AM  Result Value Ref Range   Glucose-Capillary 95 70 - 99 mg/dL    Comment: Glucose reference range applies only to samples taken after  fasting for at least 8 hours.  Glucose, capillary     Status: Abnormal   Collection Time: 09/28/21  7:10 AM  Result Value Ref Range   Glucose-Capillary 116 (H) 70 - 99 mg/dL    Comment: Glucose reference range applies only to samples taken after fasting for at least 8 hours.  Glucose, capillary     Status: Abnormal   Collection Time: 09/28/21 11:12 AM  Result Value Ref Range   Glucose-Capillary 132 (H) 70 - 99 mg/dL    Comment: Glucose reference range applies only to samples taken after fasting for at least 8 hours.     ROS:  Pertinent items are noted in  HPI.  Physical Exam: Vitals:   09/28/21 0829 09/28/21 1114  BP: (!) 186/74 (!) 157/70  Pulse: 84 64  Resp: 20 16  Temp: 98.7 F (37.1 C) 98.2 F (36.8 C)  SpO2: 96% 94%     General:alert, pleasant, laying comfortably in bed HEENT: normocephalic, atraumatic, Eyes:EOMI, conjunctiva normal Neck: No JVD Heart: normal rate, regular no murmur, gallops, or rubs.  Lungs: CTA anteriorly, echo in progress, no increased work of breathing, satting well on 2L [place] Abdomen: softer than yesterday, mild distention, active BS, no tenderness  Extremities: trace bilateral LE edema improved from yesterday Skin: dry flaky of bilateral shins, no erythema Neuro: aoX4, no focal deficits   Assessment/Plan: 1.CKDV-  baseline creatinine around 4, Bun 71 and sCr 4.33, remains stable - no indications to start on PD but preference is for PD when/if RRT needed - lasix as below   2. HFpEF exacerbation- 1.8L urine output overnight, net 1L output, weight down from 81.2 to 75.1 kg. Now on 2L [place], Hypervolemia improving still has some mild abdominal distention -Continue Lasix 80 mg twice daily, if continues to improve likely transition to PO tomorrow  -torsemide 40 mg BID at discharge  3. Anemia of CKD  - hgb stable, deferring ESA 2/2 pulmonary nodule  4. Hypokalemia- 30 meq potassium chloride   5. Hypertension- BP remains elevated working to optimize volume status as above. Restart home amlodipine,  bidil and metoprolol. ARB stopped at last admission for AKI with advance renal disease.  6. Diabetes- on glipizide at home. A1c 6.1. SSI  7. Pulmonary nodule- Follows pulm. Next Ct in April 2023  Iona Beard, MD IM PGY-2 09/28/2021, 11:51 AM    Seen and examined independently.  Agree with note and exam as documented above by Dr. Lisabeth Devoid and as noted here.  General adult male in bed in no acute distress HEENT normocephalic atraumatic extraocular movements intact sclera anicteric Neck supple trachea  midline Lungs clear to auscultation bilaterally normal work of breathing at rest  Heart regular rate and rhythm no rubs or gallops appreciated Abdomen soft nontender distended Extremities  edema  Psych normal mood and affect  # CKD stage V - no emergent indication for dialysis - preference is for PD when/if RRT needed and he is an excellent candidate   # Acute on chronic diastolic CHF  - IV diuretics for now and hopeful for management with diuretics - lasix 80 mg IV BID today.  Hope for transition to PO diuretics tomorrow  - For planning purposes he will need torsemide 40 mg BID on discharge   # Anemia CKD - defer ESA in setting of lung nodule for now   # HTN  - optimize volume with lasix and continue other current regimen    # Hypokalemia - gave additional 30 meq potassium  po today   # metabolic bone disease - phos ok   # Lung lesion - follows with pulm  # Hypernatremia  - off of bicarb for now   # Constipation  - miralax once today and daily   Claudia Desanctis, MD 12:03 PM 09/28/2021

## 2021-09-28 NOTE — Progress Notes (Signed)
SATURATION QUALIFICATIONS: (This note is used to comply with regulatory documentation for home oxygen)  Patient Saturations on Room Air at Rest = 87%  Patient Saturations on Room Air while Ambulating = N/A  Patient Saturations on 2 Liters of oxygen while Ambulating = 91%  Please briefly explain why patient needs home oxygen: Pt is requiring 2L of continuous supplemental O2 to maintain SpO2 levels >/= 91% at rest and when mobilizing.   Moishe Spice, PT, DPT Acute Rehabilitation Services  Pager: [phone] Office: [phone]

## 2021-09-28 NOTE — Assessment & Plan Note (Addendum)
Continue blood pressure control with amlodipine, Bidil, metoprolol and diuresis with torsemide. Difficult to control hypertension.

## 2021-09-28 NOTE — Plan of Care (Signed)
°  Problem: Education: Goal: Ability to demonstrate management of disease process will improve Outcome: Progressing Goal: Ability to verbalize understanding of medication therapies will improve Outcome: Progressing Goal: Individualized Educational Video(s) Outcome: Progressing   Problem: Activity: Goal: Capacity to carry out activities will improve Outcome: Progressing   Problem: Cardiac: Goal: Ability to achieve and maintain adequate cardiopulmonary perfusion will improve Outcome: Progressing   Problem: Education: Goal: Ability to describe self-care measures that may prevent or decrease complications (Diabetes Survival Skills Education) will improve Outcome: Progressing Goal: Individualized Educational Video(s) Outcome: Progressing   Problem: Coping: Goal: Ability to adjust to condition or change in health will improve Outcome: Progressing   Problem: Fluid Volume: Goal: Ability to maintain a balanced intake and output will improve Outcome: Progressing   Problem: Health Behavior/Discharge Planning: Goal: Ability to identify and utilize available resources and services will improve Outcome: Progressing Goal: Ability to manage health-related needs will improve Outcome: Progressing   Problem: Metabolic: Goal: Ability to maintain appropriate glucose levels will improve Outcome: Progressing   Problem: Nutritional: Goal: Maintenance of adequate nutrition will improve Outcome: Progressing Goal: Progress toward achieving an optimal weight will improve Outcome: Progressing   Problem: Skin Integrity: Goal: Risk for impaired skin integrity will decrease Outcome: Progressing   Problem: Tissue Perfusion: Goal: Adequacy of tissue perfusion will improve Outcome: Progressing   Problem: Education: Goal: Knowledge of disease and its progression will improve Outcome: Progressing Goal: Individualized Educational Video(s) Outcome: Progressing   Problem: Fluid Volume: Goal:  Compliance with measures to maintain balanced fluid volume will improve Outcome: Progressing   Problem: Health Behavior/Discharge Planning: Goal: Ability to manage health-related needs will improve Outcome: Progressing   Problem: Nutritional: Goal: Ability to make healthy dietary choices will improve Outcome: Progressing   Problem: Clinical Measurements: Goal: Complications related to the disease process, condition or treatment will be avoided or minimized Outcome: Progressing

## 2021-09-28 NOTE — Progress Notes (Signed)
Pt is alert and oriented x 4, progressing well and diuresing. He has no complaints of pain this shift and has been compliant with all medications. Blood glucose has been controlled this shift with little to no insulin needed.  Plan of care will continue as ordered.

## 2021-09-28 NOTE — Progress Notes (Signed)
Mobility Specialist Progress Note:   09/28/21 1620  Mobility  Activity Transferred from chair to bed  Level of Assistance Standby assist, set-up cues, supervision of patient - no hands on  Assistive Device None  Distance Ambulated (ft) 3 ft  Activity Response Tolerated well  $Mobility charge 1 Mobility   Pt requesting to get back to bed this afternoon. No physical assist required, pt asx during transfer. Pt back in bed with bed alarm on and all needs met.   Nelta Numbers Mobility Specialist  Phone [phone]

## 2021-09-29 DIAGNOSIS — E1169 Type 2 diabetes mellitus with other specified complication: Secondary | ICD-10-CM

## 2021-09-29 DIAGNOSIS — I5033 Acute on chronic diastolic (congestive) heart failure: Secondary | ICD-10-CM | POA: Diagnosis not present

## 2021-09-29 DIAGNOSIS — N179 Acute kidney failure, unspecified: Secondary | ICD-10-CM | POA: Diagnosis not present

## 2021-09-29 DIAGNOSIS — Z8546 Personal history of malignant neoplasm of prostate: Secondary | ICD-10-CM

## 2021-09-29 DIAGNOSIS — I10 Essential (primary) hypertension: Secondary | ICD-10-CM

## 2021-09-29 DIAGNOSIS — J9601 Acute respiratory failure with hypoxia: Secondary | ICD-10-CM | POA: Diagnosis not present

## 2021-09-29 DIAGNOSIS — D500 Iron deficiency anemia secondary to blood loss (chronic): Secondary | ICD-10-CM

## 2021-09-29 DIAGNOSIS — N189 Chronic kidney disease, unspecified: Secondary | ICD-10-CM

## 2021-09-29 DIAGNOSIS — E785 Hyperlipidemia, unspecified: Secondary | ICD-10-CM

## 2021-09-29 LAB — CBC
HCT: 26.9 % — ABNORMAL LOW (ref 39.0–52.0)
Hemoglobin: 8.8 g/dL — ABNORMAL LOW (ref 13.0–17.0)
MCH: 27.5 pg (ref 26.0–34.0)
MCHC: 32.7 g/dL (ref 30.0–36.0)
MCV: 84.1 fL (ref 80.0–100.0)
Platelets: 195 10*3/uL (ref 150–400)
RBC: 3.2 MIL/uL — ABNORMAL LOW (ref 4.22–5.81)
RDW: 16.3 % — ABNORMAL HIGH (ref 11.5–15.5)
WBC: 8.4 10*3/uL (ref 4.0–10.5)
nRBC: 0.2 % (ref 0.0–0.2)

## 2021-09-29 LAB — GLUCOSE, CAPILLARY
Glucose-Capillary: 101 mg/dL — ABNORMAL HIGH (ref 70–99)
Glucose-Capillary: 110 mg/dL — ABNORMAL HIGH (ref 70–99)
Glucose-Capillary: 118 mg/dL — ABNORMAL HIGH (ref 70–99)
Glucose-Capillary: 150 mg/dL — ABNORMAL HIGH (ref 70–99)
Glucose-Capillary: 159 mg/dL — ABNORMAL HIGH (ref 70–99)
Glucose-Capillary: 204 mg/dL — ABNORMAL HIGH (ref 70–99)

## 2021-09-29 LAB — BASIC METABOLIC PANEL
Anion gap: 12 (ref 5–15)
BUN: 74 mg/dL — ABNORMAL HIGH (ref 8–23)
CO2: 21 mmol/L — ABNORMAL LOW (ref 22–32)
Calcium: 8.5 mg/dL — ABNORMAL LOW (ref 8.9–10.3)
Chloride: 112 mmol/L — ABNORMAL HIGH (ref 98–111)
Creatinine, Ser: 4.44 mg/dL — ABNORMAL HIGH (ref 0.61–1.24)
GFR, Estimated: 12 mL/min — ABNORMAL LOW (ref >60)
Glucose, Bld: 114 mg/dL — ABNORMAL HIGH (ref 70–99)
Potassium: 3.9 mmol/L (ref 3.5–5.1)
Sodium: 145 mmol/L (ref 135–145)

## 2021-09-29 MED ORDER — METOLAZONE 5 MG PO TABS
5.0000 mg | ORAL_TABLET | Freq: Once | ORAL | Status: AC
  Administered 2021-09-29: 5 mg via ORAL
  Filled 2021-09-29: qty 1

## 2021-09-29 MED ORDER — HYDRALAZINE HCL 20 MG/ML IJ SOLN
5.0000 mg | Freq: Once | INTRAMUSCULAR | Status: AC
  Administered 2021-09-29: 5 mg via INTRAVENOUS
  Filled 2021-09-29: qty 1

## 2021-09-29 MED ORDER — FUROSEMIDE 10 MG/ML IJ SOLN
80.0000 mg | Freq: Once | INTRAMUSCULAR | Status: AC
  Administered 2021-09-29: 80 mg via INTRAVENOUS
  Filled 2021-09-29: qty 8

## 2021-09-29 MED ORDER — LACTULOSE 10 GM/15ML PO SOLN
20.0000 g | Freq: Once | ORAL | Status: AC
  Administered 2021-09-29: 20 g via ORAL
  Filled 2021-09-29: qty 30

## 2021-09-29 MED ORDER — TORSEMIDE 20 MG PO TABS
40.0000 mg | ORAL_TABLET | Freq: Two times a day (BID) | ORAL | Status: DC
  Administered 2021-09-30: 40 mg via ORAL
  Filled 2021-09-29: qty 2

## 2021-09-29 NOTE — Assessment & Plan Note (Addendum)
CKD stage V. hypokalemia Patient with advanced renal disease, he tolerated well diuresis with furosemide.  His urine output over last 24 hrs has been 2,650 ml.   At his discharge his renal function has a serum cr of 4.51 with K at 3,0 and bicarbonate at 24.  Ca 8,6 and alb 2,9.  P is 3,6   Plan to continue blood pressure control and diuresis with torsemide 40 mg po bid   Anemia of chronic kidney disease,hgb is 8,8 follow up cell count as outpatient.

## 2021-09-29 NOTE — Progress Notes (Signed)
Pt is alert and oriented x 4. MD ordered to wean patient off oxygen. Turned off oxygen at this time to check how patient will sustain on room air. Pt has had no complaints of pain or discomfort this shift and has shown no signs of distress.  Pt tolerating new medications and blood pressure has remained within normal limits. Pt is voiding well.  Plan of care will continue as ordered. Per patient, he may be discharged back home with  his wife tomorrow 09/30/2021.

## 2021-09-29 NOTE — Plan of Care (Signed)
°  Problem: Education: Goal: Ability to demonstrate management of disease process will improve Outcome: Progressing Goal: Ability to verbalize understanding of medication therapies will improve Outcome: Progressing Goal: Individualized Educational Video(s) Outcome: Progressing   Problem: Activity: Goal: Capacity to carry out activities will improve Outcome: Progressing   Problem: Cardiac: Goal: Ability to achieve and maintain adequate cardiopulmonary perfusion will improve Outcome: Progressing   Problem: Education: Goal: Ability to describe self-care measures that may prevent or decrease complications (Diabetes Survival Skills Education) will improve Outcome: Progressing Goal: Individualized Educational Video(s) Outcome: Progressing   Problem: Coping: Goal: Ability to adjust to condition or change in health will improve Outcome: Progressing   Problem: Fluid Volume: Goal: Ability to maintain a balanced intake and output will improve Outcome: Progressing   Problem: Health Behavior/Discharge Planning: Goal: Ability to identify and utilize available resources and services will improve Outcome: Progressing Goal: Ability to manage health-related needs will improve Outcome: Progressing   Problem: Metabolic: Goal: Ability to maintain appropriate glucose levels will improve Outcome: Progressing   Problem: Nutritional: Goal: Maintenance of adequate nutrition will improve Outcome: Progressing Goal: Progress toward achieving an optimal weight will improve Outcome: Progressing   Problem: Skin Integrity: Goal: Risk for impaired skin integrity will decrease Outcome: Progressing   Problem: Tissue Perfusion: Goal: Adequacy of tissue perfusion will improve Outcome: Progressing   Problem: Education: Goal: Knowledge of disease and its progression will improve Outcome: Progressing Goal: Individualized Educational Video(s) Outcome: Progressing   Problem: Fluid Volume: Goal:  Compliance with measures to maintain balanced fluid volume will improve Outcome: Progressing   Problem: Health Behavior/Discharge Planning: Goal: Ability to manage health-related needs will improve Outcome: Progressing   Problem: Nutritional: Goal: Ability to make healthy dietary choices will improve Outcome: Progressing   Problem: Clinical Measurements: Goal: Complications related to the disease process, condition or treatment will be avoided or minimized Outcome: Progressing

## 2021-09-29 NOTE — Plan of Care (Signed)
°  Problem: Activity: Goal: Capacity to carry out activities will improve Outcome: Progressing   Problem: Cardiac: Goal: Ability to achieve and maintain adequate cardiopulmonary perfusion will improve Outcome: Progressing   Problem: Fluid Volume: Goal: Ability to maintain a balanced intake and output will improve Outcome: Progressing   Problem: Fluid Volume: Goal: Compliance with measures to maintain balanced fluid volume will improve Outcome: Progressing

## 2021-09-29 NOTE — Progress Notes (Signed)
Hypoglycemic Event  CBG: 68  Treatment: 177ml juice  Symptoms: None  Follow-up CBG: Time: 0053 CBG Result: 101  Possible Reasons for Event: SSI q4h  Comments/MD notified: Hypoglycemia protocol followed    Blanco Lions

## 2021-09-29 NOTE — Progress Notes (Signed)
[facility] Renal Consultation Note  Requesting MD: Toy Baker Indication for Consultation: CKDV  Subjective: Austin [name] is a 87 y.o. male with history of HFpEF last echo 06/2021 with EF 55-60% and grade I DD, diet controled DM2, CKDV, prostate cancer presents for worsening dyspnea and admitted for CHF exacerbation. Nephrology consulted in setting of advanced renal disease, kidney function has been stable no urgent indication for dialysis.   Creatinine  Date/Time Value Ref Range Status  12/02/2019 12:00 AM 2.9 (A) 0.6 - 1.3 Final  08/19/2019 11:07 AM 2.81 (H) 0.61 - 1.24 mg/dL Final   Creat  Date/Time Value Ref Range Status  03/03/2020 02:54 PM 3.59 (H) 0.70 - 1.11 mg/dL Final    Comment:    For patients >23 years of age, the reference limit for Creatinine is approximately 13% higher for people identified as African-American. .    Creatinine, Ser  Date/Time Value Ref Range Status  09/29/2021 03:22 AM 4.44 (H) 0.61 - 1.24 mg/dL Final  09/28/2021 02:11 AM 4.33 (H) 0.61 - 1.24 mg/dL Final  09/27/2021 08:00 AM 4.52 (H) 0.61 - 1.24 mg/dL Final  09/26/2021 09:17 PM 4.33 (H) 0.61 - 1.24 mg/dL Final  07/21/2021 02:03 PM 5.06 (HH) 0.40 - 1.50 mg/dL Final  07/14/2021 01:45 AM 4.77 (H) 0.61 - 1.24 mg/dL Final  07/13/2021 03:12 AM 4.81 (H) 0.61 - 1.24 mg/dL Final  07/12/2021 02:30 AM 4.86 (H) 0.61 - 1.24 mg/dL Final  07/11/2021 02:35 AM 4.77 (H) 0.61 - 1.24 mg/dL Final  07/10/2021 02:46 AM 4.52 (H) 0.61 - 1.24 mg/dL Final  07/09/2021 09:08 AM 4.04 (H) 0.61 - 1.24 mg/dL Final  07/08/2021 11:58 PM 4.02 (H) 0.61 - 1.24 mg/dL Final  06/11/2021 10:57 AM 3.70 (H) 0.40 - 1.50 mg/dL Final  05/27/2021 11:19 AM 4.07 (H) 0.40 - 1.50 mg/dL Final  10/12/2020 03:39 AM 4.78 (H) 0.61 - 1.24 mg/dL Final  10/11/2020 05:04 AM 4.64 (H) 0.61 - 1.24 mg/dL Final  10/10/2020 06:53 AM 5.01 (H) 0.61 - 1.24 mg/dL Final  10/09/2020 08:00 PM 5.38 (H) 0.61 - 1.24 mg/dL Final  10/09/2020  03:33 PM 5.86 (H) 0.61 - 1.24 mg/dL Final  10/09/2020 10:35 AM 6.24 (H) 0.61 - 1.24 mg/dL Final  09/23/2020 09:45 AM 5.63 (HH) 0.40 - 1.50 mg/dL Final  09/20/2020 02:38 AM 4.22 (H) 0.61 - 1.24 mg/dL Final  09/19/2020 05:01 AM 3.94 (H) 0.61 - 1.24 mg/dL Final  09/18/2020 05:27 AM 3.70 (H) 0.61 - 1.24 mg/dL Final  09/17/2020 01:59 AM 3.60 (H) 0.61 - 1.24 mg/dL Final  09/16/2020 07:59 AM 3.51 (H) 0.61 - 1.24 mg/dL Final  02/26/2020 07:24 AM 3.35 (H) 0.61 - 1.24 mg/dL Final  02/25/2020 03:21 AM 3.12 (H) 0.61 - 1.24 mg/dL Final  02/24/2020 03:39 AM 2.92 (H) 0.61 - 1.24 mg/dL Final  02/23/2020 03:59 PM 3.09 (H) 0.61 - 1.24 mg/dL Final  11/30/2019 08:00 PM 3.08 (H) 0.61 - 1.24 mg/dL Final  11/28/2019 05:29 AM 2.91 (H) 0.61 - 1.24 mg/dL Final  08/14/2019 10:28 AM 2.73 (H) 0.40 - 1.50 mg/dL Final  08/06/2019 05:43 AM 3.00 (H) 0.61 - 1.24 mg/dL Final  08/05/2019 05:36 AM 2.95 (H) 0.61 - 1.24 mg/dL Final  08/04/2019 05:08 AM 2.67 (H) 0.61 - 1.24 mg/dL Final  05/01/2019 09:53 AM 3.15 (H) 0.40 - 1.50 mg/dL Final  04/03/2019 01:51 PM 3.46 (H) 0.61 - 1.24 mg/dL Final  03/29/2019 11:51 AM 3.30 (H) 0.61 - 1.24 mg/dL Final  03/29/2019 04:07 AM 3.38 (H) 0.61 - 1.24 mg/dL  Final  03/28/2019 07:26 PM 3.30 (H) 0.61 - 1.24 mg/dL Final  03/28/2019 05:40 AM 2.59 (H) 0.61 - 1.24 mg/dL Final  03/27/2019 08:30 PM 2.94 (H) 0.61 - 1.24 mg/dL Final  01/14/2019 01:15 PM 2.88 (H) 0.61 - 1.24 mg/dL Final  07/25/2018 11:24 AM 2.49 (H) 0.40 - 1.50 mg/dL Final  11/20/2017 10:27 AM 2.08 (H) 0.40 - 1.50 mg/dL Final  01/09/2017 10:16 AM 1.80 (H) 0.40 - 1.50 mg/dL Final  12/12/2016 01:39 PM 2.06 (H) 0.40 - 1.50 mg/dL Final  11/09/2016 02:26 PM 2.32 (H) 0.40 - 1.50 mg/dL Final  11/05/2016 06:12 AM 1.45 (H) 0.61 - 1.24 mg/dL Final  11/04/2016 12:41 AM 1.90 (H) 0.61 - 1.24 mg/dL Final  11/03/2016 09:15 AM 2.29 (H) 0.61 - 1.24 mg/dL Final     PMHx:   Past Medical History:  Diagnosis Date   Acute diastolic CHF (congestive  heart failure) ([place]) 09/17/2020   Acute exacerbation of CHF (congestive heart failure) ([place]) 08/04/2019   ANEMIA DUE TO CHRONIC BLOOD LOSS 03/13/2007   CAROTID ARTERY STENOSIS 05/10/2010   CHF (congestive heart failure) ([place])    DIABETES MELLITUS, TYPE II 09/19/2007   DISEASE, CEREBROVASCULAR NEC 03/05/2007   GERD 03/13/2007   HYPERLIPIDEMIA 03/05/2007   HYPERTENSION 03/05/2007   HYPOKALEMIA 11/09/2009   KNEE PAIN, RIGHT 11/09/2009   PROSTATE CANCER, HX OF 03/05/2007   RENAL DISEASE, CHRONIC 02/03/2009    Past Surgical History:  Procedure Laterality Date   CAROTID ARTERY ANGIOPLASTY Right Oct. 10, 2001   ESOPHAGOGASTRODUODENOSCOPY (EGD) WITH PROPOFOL N/A 11/04/2016   Procedure: ESOPHAGOGASTRODUODENOSCOPY (EGD) WITH PROPOFOL;  Surgeon: Otis Brace, MD;  Location: [facility];  Service: Gastroenterology;  Laterality: N/A;   LAPAROSCOPIC APPENDECTOMY  02/20/2012   Procedure: APPENDECTOMY LAPAROSCOPIC;  Surgeon: Stark Klein, MD;  Location: [facility];  Service: General;  Laterality: N/A;   PROSTATE SURGERY     prostatectomy    Family Hx:  Family History  Problem Relation Age of Onset   Hypertension Mother    Cancer Father        Mesothelioma    Stomach cancer Brother    Cancer Brother    Cancer - Cervical Brother    Cancer Brother    Diabetes Brother    Esophageal cancer Neg Hx    Colon cancer Neg Hx    Pancreatic cancer Neg Hx     Social History:  reports that he quit smoking about 47 years ago. His smoking use included cigarettes. He has a 10.00 pack-year smoking history. He has never used smokeless tobacco. He reports that he does not drink alcohol and does not use drugs.  Allergies:  Allergies  Allergen Reactions   Aspirin Other (See Comments)    High doses causes stomach ulcer and bleeding    Medications: Prior to Admission medications   Medication Sig Start Date End Date Taking? Authorizing Provider  ACCU-CHEK GUIDE test strip USED TO CHECK BLOOD GLUCOSE TWICE A DAY OR AS  NEEDED Patient taking differently: in the morning and at bedtime. 10/20/20  Yes Nafziger, Tommi Rumps, NP  Accu-Chek Softclix Lancets lancets USED TO CHECK BLOOD GLUCOSE TWICE A DAY OR AS NEEDED Patient taking differently: in the morning and at bedtime. 12/25/20  Yes Nafziger, Tommi Rumps, NP  acetaminophen (TYLENOL) 325 MG tablet Take 325 mg by mouth 4 (four) times daily.   Yes [provider]  albuterol (VENTOLIN HFA) 108 (90 Base) MCG/ACT inhaler TAKE 2 PUFFS BY MOUTH EVERY 6 HOURS AS NEEDED FOR WHEEZE OR SHORTNESS OF  BREATH Patient taking differently: Inhale 2 puffs into the lungs every 6 (six) hours as needed for shortness of breath or wheezing. 08/04/21  Yes Collene Gobble, MD  amLODipine (NORVASC) 10 MG tablet TAKE 1 TABLET BY MOUTH EVERY DAY Patient taking differently: Take 10 mg by mouth daily. 07/13/21  Yes Nafziger, Tommi Rumps, NP  atorvastatin (LIPITOR) 20 MG tablet TAKE 1 TABLET BY MOUTH EVERY DAY Patient taking differently: Take 20 mg by mouth daily. 12/29/20  Yes Nafziger, Tommi Rumps, NP  bisacodyl (DULCOLAX) 10 MG suppository Place 1 suppository (10 mg total) rectally daily as needed for mild constipation. 07/13/21  Yes Geradine Girt, DO  Blood Glucose Monitoring Suppl (ACCU-CHEK AVIVA PLUS) w/Device KIT Used to check blood glucose 2 times a day or PRN Patient taking differently: in the morning and at bedtime. 09/04/19  Yes Nafziger, Tommi Rumps, NP  cholecalciferol (VITAMIN D3) 25 MCG (1000 UNIT) tablet Take 1,000 Units by mouth daily.   Yes [provider]  cloNIDine (CATAPRES) 0.3 MG tablet Take 1 tablet (0.3 mg total) by mouth 3 (three) times daily. 09/20/20  Yes Harold Hedge, MD  clopidogrel (PLAVIX) 75 MG tablet TAKE 1 TABLET BY MOUTH EVERY DAY Patient taking differently: Take 75 mg by mouth daily. 07/13/21  Yes Nafziger, Tommi Rumps, NP  doxazosin (CARDURA) 2 MG tablet Take 2 mg by mouth at bedtime.  12/28/17  Yes [provider]  esomeprazole (NEXIUM) 40 MG capsule TAKE 1 CAPSULE BY MOUTH  EVERY DAY Patient taking differently: 40 mg daily. 05/13/21  Yes Nafziger, Tommi Rumps, NP  glipiZIDE (GLUCOTROL XL) 5 MG 24 hr tablet TAKE 1 TABLET BY MOUTH EVERY DAY Patient taking differently: Take 5 mg by mouth daily with breakfast. 09/16/21  Yes Nafziger, Tommi Rumps, NP  isosorbide-hydrALAZINE (BIDIL) 20-37.5 MG tablet Take 1 tablet by mouth 3 (three) times daily.   Yes [provider]  KLOR-CON M10 10 MEQ tablet Take 2 tablets (20 mEq total) by mouth 2 (two) times daily. Patient taking differently: Take 10 mEq by mouth 2 (two) times daily. 09/20/20 09/26/21 Yes Harold Hedge, MD  lactulose ([place]) 10 GM/15ML solution TAKE 15 ML BY MOUTH 2 TIMES DAILY AS NEEDED FOR MILD CONSTIPATION. Patient taking differently: Take 10 g by mouth See admin instructions. Every 2-3 days, alternating with Citrucel 03/18/21  Yes Nafziger, Tommi Rumps, NP  methylcellulose (CITRUCEL) oral powder Take 1 packet by mouth See admin instructions. Every 2-3 days,alternating with lactulose   Yes [provider]  metoprolol succinate (TOPROL-XL) 100 MG 24 hr tablet Take 1 tablet (100 mg total) by mouth daily. Take with or immediately following a meal. 09/21/20 09/26/21 Yes Harold Hedge, MD  Propylene Glycol (SYSTANE BALANCE OP) Place 1 drop into both eyes at bedtime.   Yes [provider]  torsemide (DEMADEX) 20 MG tablet Take 20 mg by mouth 2 (two) times daily.   Yes [provider]  triamcinolone cream (KENALOG) 0.1 % Apply 1 application topically daily as needed (dry/irritated skin). 07/05/19  Yes [provider]  hydrocortisone (ANUSOL-HC) 2.5 % rectal cream PLACE 1 APPLICATION RECTALLY 2 TIMES A DAY. Patient taking differently: Place 1 application rectally 2 (two) times daily. 03/31/21   Burchette, Alinda Sierras, MD  polyethylene glycol (MIRALAX / GLYCOLAX) 17 g packet Take 17 g by mouth daily as needed for mild constipation. Patient not taking: Reported on 09/26/2021    [provider]   torsemide 40 MG TABS Take 40 mg by mouth daily. Patient not taking: Reported on  09/26/2021 07/14/21   Geradine Girt, DO    Prior to Admission:  Facility-Administered Medications Prior to Admission  Medication Dose Route Frequency Provider Last Rate Last Admin   ammonium lactate (LAC-HYDRIN) 12 % lotion   Topical PRN Felipa Furnace, DPM       Medications Prior to Admission  Medication Sig Dispense Refill Last Dose   ACCU-CHEK GUIDE test strip USED TO CHECK BLOOD GLUCOSE TWICE A DAY OR AS NEEDED (Patient taking differently: in the morning and at bedtime.) 100 strip 1    Accu-Chek Softclix Lancets lancets USED TO CHECK BLOOD GLUCOSE TWICE A DAY OR AS NEEDED (Patient taking differently: in the morning and at bedtime.) 100 each 6    acetaminophen (TYLENOL) 325 MG tablet Take 325 mg by mouth 4 (four) times daily.   09/26/2021   albuterol (VENTOLIN HFA) 108 (90 Base) MCG/ACT inhaler TAKE 2 PUFFS BY MOUTH EVERY 6 HOURS AS NEEDED FOR WHEEZE OR SHORTNESS OF BREATH (Patient taking differently: Inhale 2 puffs into the lungs every 6 (six) hours as needed for shortness of breath or wheezing.) 8.5 each 5 09/26/2021   amLODipine (NORVASC) 10 MG tablet TAKE 1 TABLET BY MOUTH EVERY DAY (Patient taking differently: Take 10 mg by mouth daily.) 90 tablet 1 09/26/2021   atorvastatin (LIPITOR) 20 MG tablet TAKE 1 TABLET BY MOUTH EVERY DAY (Patient taking differently: Take 20 mg by mouth daily.) 90 tablet 3 09/26/2021   bisacodyl (DULCOLAX) 10 MG suppository Place 1 suppository (10 mg total) rectally daily as needed for mild constipation. 12 suppository 0 unk   Blood Glucose Monitoring Suppl (ACCU-CHEK AVIVA PLUS) w/Device KIT Used to check blood glucose 2 times a day or PRN (Patient taking differently: in the morning and at bedtime.) 1 kit 0    cholecalciferol (VITAMIN D3) 25 MCG (1000 UNIT) tablet Take 1,000 Units by mouth daily.   09/26/2021   cloNIDine (CATAPRES) 0.3 MG tablet Take 1 tablet (0.3 mg total) by mouth 3 (three)  times daily. 60 tablet 0 09/26/2021   clopidogrel (PLAVIX) 75 MG tablet TAKE 1 TABLET BY MOUTH EVERY DAY (Patient taking differently: Take 75 mg by mouth daily.) 90 tablet 3 09/26/2021   doxazosin (CARDURA) 2 MG tablet Take 2 mg by mouth at bedtime.   3 09/25/2021   esomeprazole (NEXIUM) 40 MG capsule TAKE 1 CAPSULE BY MOUTH EVERY DAY (Patient taking differently: 40 mg daily.) 90 capsule 3 09/26/2021   glipiZIDE (GLUCOTROL XL) 5 MG 24 hr tablet TAKE 1 TABLET BY MOUTH EVERY DAY (Patient taking differently: Take 5 mg by mouth daily with breakfast.) 90 tablet 0 09/26/2021   isosorbide-hydrALAZINE (BIDIL) 20-37.5 MG tablet Take 1 tablet by mouth 3 (three) times daily.   09/26/2021   KLOR-CON M10 10 MEQ tablet Take 2 tablets (20 mEq total) by mouth 2 (two) times daily. (Patient taking differently: Take 10 mEq by mouth 2 (two) times daily.) 120 tablet 0 09/26/2021   lactulose (CHRONULAC) 10 GM/15ML solution TAKE 15 ML BY MOUTH 2 TIMES DAILY AS NEEDED FOR MILD CONSTIPATION. (Patient taking differently: Take 10 g by mouth See admin instructions. Every 2-3 days, alternating with Citrucel) 237 mL 2 09/26/2021   methylcellulose (CITRUCEL) oral powder Take 1 packet by mouth See admin instructions. Every 2-3 days,alternating with lactulose   09/25/2021   metoprolol succinate (TOPROL-XL) 100 MG 24 hr tablet Take 1 tablet (100 mg total) by mouth daily. Take with or immediately following a meal. 30 tablet 0 09/26/2021 at 1130  Propylene Glycol (SYSTANE BALANCE OP) Place 1 drop into both eyes at bedtime.   09/25/2021   torsemide (DEMADEX) 20 MG tablet Take 20 mg by mouth 2 (two) times daily.   09/26/2021   triamcinolone cream (KENALOG) 0.1 % Apply 1 application topically daily as needed (dry/irritated skin).   Past Month   hydrocortisone (ANUSOL-HC) 2.5 % rectal cream PLACE 1 APPLICATION RECTALLY 2 TIMES A DAY. (Patient taking differently: Place 1 application rectally 2 (two) times daily.) 30 g 0    polyethylene glycol (MIRALAX / GLYCOLAX)  17 g packet Take 17 g by mouth daily as needed for mild constipation. (Patient not taking: Reported on 09/26/2021)   Not Taking   torsemide 40 MG TABS Take 40 mg by mouth daily. (Patient not taking: Reported on 09/26/2021) 30 tablet 0 Not Taking    Labs:  Results for orders placed or performed during the hospital encounter of 09/26/21 (from the past 48 hour(s))  CBG monitoring, ED     Status: Abnormal   Collection Time: 09/27/21  8:41 AM  Result Value Ref Range   Glucose-Capillary 119 (H) 70 - 99 mg/dL    Comment: Glucose reference range applies only to samples taken after fasting for at least 8 hours.  CBG monitoring, ED     Status: None   Collection Time: 09/27/21 12:35 PM  Result Value Ref Range   Glucose-Capillary 72 70 - 99 mg/dL    Comment: Glucose reference range applies only to samples taken after fasting for at least 8 hours.  Glucose, capillary     Status: Abnormal   Collection Time: 09/27/21  4:11 PM  Result Value Ref Range   Glucose-Capillary 106 (H) 70 - 99 mg/dL    Comment: Glucose reference range applies only to samples taken after fasting for at least 8 hours.  Glucose, capillary     Status: Abnormal   Collection Time: 09/27/21  8:22 PM  Result Value Ref Range   Glucose-Capillary 102 (H) 70 - 99 mg/dL    Comment: Glucose reference range applies only to samples taken after fasting for at least 8 hours.  Glucose, capillary     Status: Abnormal   Collection Time: 09/28/21 12:03 AM  Result Value Ref Range   Glucose-Capillary 110 (H) 70 - 99 mg/dL    Comment: Glucose reference range applies only to samples taken after fasting for at least 8 hours.  CBC     Status: Abnormal   Collection Time: 09/28/21  2:11 AM  Result Value Ref Range   WBC 9.3 4.0 - 10.5 K/uL   RBC 3.41 (L) 4.22 - 5.81 MIL/uL   Hemoglobin 9.2 (L) 13.0 - 17.0 g/dL   HCT 28.7 (L) 39.0 - 52.0 %   MCV 84.2 80.0 - 100.0 fL   MCH 27.0 26.0 - 34.0 pg   MCHC 32.1 30.0 - 36.0 g/dL   RDW 16.5 (H) 11.5 - 15.5 %    Platelets 226 150 - 400 K/uL   nRBC 0.0 0.0 - 0.2 %    Comment: Performed at [facility] [address] [place], [place] [address]  Comprehensive metabolic panel     Status: Abnormal   Collection Time: 09/28/21  2:11 AM  Result Value Ref Range   Sodium 147 (H) 135 - 145 mmol/L   Potassium 3.4 (L) 3.5 - 5.1 mmol/L   Chloride 114 (H) 98 - 111 mmol/L   CO2 21 (L) 22 - 32 mmol/L   Glucose, Bld 103 (H)  70 - 99 mg/dL    Comment: Glucose reference range applies only to samples taken after fasting for at least 8 hours.   BUN 71 (H) 8 - 23 mg/dL   Creatinine, Ser 4.33 (H) 0.61 - 1.24 mg/dL   Calcium 8.9 8.9 - 10.3 mg/dL   Total Protein 6.6 6.5 - 8.1 g/dL   Albumin 3.6 3.5 - 5.0 g/dL   AST 14 (L) 15 - 41 U/L   ALT 10 0 - 44 U/L   Alkaline Phosphatase 77 38 - 126 U/L   Total Bilirubin 0.4 0.3 - 1.2 mg/dL   GFR, Estimated 13 (L) >60 mL/min    Comment: (NOTE) Calculated using the CKD-EPI Creatinine Equation (2021)    Anion gap 12 5 - 15    Comment: Performed at [facility], [place] [address] [place], [place] [address]  Glucose, capillary     Status: None   Collection Time: 09/28/21  3:49 AM  Result Value Ref Range   Glucose-Capillary 95 70 - 99 mg/dL    Comment: Glucose reference range applies only to samples taken after fasting for at least 8 hours.  Glucose, capillary     Status: Abnormal   Collection Time: 09/28/21  7:10 AM  Result Value Ref Range   Glucose-Capillary 116 (H) 70 - 99 mg/dL    Comment: Glucose reference range applies only to samples taken after fasting for at least 8 hours.  Glucose, capillary     Status: Abnormal   Collection Time: 09/28/21 11:12 AM  Result Value Ref Range   Glucose-Capillary 132 (H) 70 - 99 mg/dL    Comment: Glucose reference range applies only to samples taken after fasting for at least 8 hours.  Glucose, capillary     Status: Abnormal   Collection Time: 09/28/21  3:47 PM  Result Value Ref Range   Glucose-Capillary 220  (H) 70 - 99 mg/dL    Comment: Glucose reference range applies only to samples taken after fasting for at least 8 hours.  Glucose, capillary     Status: Abnormal   Collection Time: 09/28/21  7:58 PM  Result Value Ref Range   Glucose-Capillary 163 (H) 70 - 99 mg/dL    Comment: Glucose reference range applies only to samples taken after fasting for at least 8 hours.   Comment 1 Notify RN    Comment 2 Document in Chart   Glucose, capillary     Status: Abnormal   Collection Time: 09/28/21 11:56 PM  Result Value Ref Range   Glucose-Capillary 68 (L) 70 - 99 mg/dL    Comment: Glucose reference range applies only to samples taken after fasting for at least 8 hours.  Glucose, capillary     Status: Abnormal   Collection Time: 09/29/21 12:53 AM  Result Value Ref Range   Glucose-Capillary 101 (H) 70 - 99 mg/dL    Comment: Glucose reference range applies only to samples taken after fasting for at least 8 hours.  CBC     Status: Abnormal   Collection Time: 09/29/21  3:22 AM  Result Value Ref Range   WBC 8.4 4.0 - 10.5 K/uL   RBC 3.20 (L) 4.22 - 5.81 MIL/uL   Hemoglobin 8.8 (L) 13.0 - 17.0 g/dL   HCT 26.9 (L) 39.0 - 52.0 %   MCV 84.1 80.0 - 100.0 fL   MCH 27.5 26.0 - 34.0 pg   MCHC 32.7 30.0 - 36.0 g/dL   RDW 16.3 (H) 11.5 - 15.5 %  Platelets 195 150 - 400 K/uL   nRBC 0.2 0.0 - 0.2 %    Comment: Performed at [facility], [place] [address] [place], [place] [address]  Basic metabolic panel     Status: Abnormal   Collection Time: 09/29/21  3:22 AM  Result Value Ref Range   Sodium 145 135 - 145 mmol/L   Potassium 3.9 3.5 - 5.1 mmol/L   Chloride 112 (H) 98 - 111 mmol/L   CO2 21 (L) 22 - 32 mmol/L   Glucose, Bld 114 (H) 70 - 99 mg/dL    Comment: Glucose reference range applies only to samples taken after fasting for at least 8 hours.   BUN 74 (H) 8 - 23 mg/dL   Creatinine, Ser 4.44 (H) 0.61 - 1.24 mg/dL   Calcium 8.5 (L) 8.9 - 10.3 mg/dL   GFR, Estimated 12 (L) >60 mL/min    Comment:  (NOTE) Calculated using the CKD-EPI Creatinine Equation (2021)    Anion gap 12 5 - 15    Comment: Performed at [facility] [address], [place], [place] [address]  Glucose, capillary     Status: Abnormal   Collection Time: 09/29/21  4:53 AM  Result Value Ref Range   Glucose-Capillary 118 (H) 70 - 99 mg/dL    Comment: Glucose reference range applies only to samples taken after fasting for at least 8 hours.     ROS:  Pertinent items are noted in HPI.  Physical Exam: Vitals:   09/29/21 0459 09/29/21 0736  BP: (!) 159/64 (!) 185/66  Pulse: 65 70  Resp: 18 19  Temp: 98.7 F (37.1 C) 98.7 F (37.1 C)  SpO2: 95% 95%     General:alert, pleasant, laying comfortably in bed HEENT: normocephalic, atraumatic, Eyes:EOMI, conjunctiva normal Neck: No JVD Heart: normal rate, regular no murmur, gallops, or rubs.  Lungs: CTA anteriorly, echo in progress, no increased work of breathing, satting well on 2L [place] Abdomen: softer than yesterday, mild distention, active BS, no tenderness  Extremities: trace bilateral LE edema improved from yesterday Skin: dry flaky of bilateral shins, no erythema Neuro: aoX4, no focal deficits   Assessment/Plan: CKDV-  baseline creatinine around 4.8, BUN 75 and sCr 4.4 and stable - no indications to start on PD but preference is for PD when/if RRT needed - lasix as below   HFpEF exacerbation- Good urine output overnight however weight appear up by 1.7kg. Remains on 2L [place].  -Transition to oral torsemide 40 mg BID  Anemia of CKD  - hgb stable, deferring ESA 2/2 pulmonary nodule. Transfuse if <7. May benefit from iron transfusion given low ferritin with CHF exacerbation  Hypokalemia- resolved with supplementation  Hypernatremia- resolved   Hypertension- BP remains elevated working to optimize volume status as above. Restart home amlodipine,  bidil and metoprolol. ARB stopped at last admission for AKI with advance renal disease.  Diabetes- on  glipizide at home. A1c 6.1. SSI  Pulmonary nodule- Follows pulm. Next Ct in April 2023  Iona Beard, MD IM PGY-2 09/29/2021, 8:03 AM     Seen and examined independently.  Agree with note and exam as documented above by Dr. Lisabeth Devoid and as noted here.  Still on some oxygen and requested the "mask" for a short time last night he states.   General adult male in bed in no acute distress HEENT normocephalic atraumatic extraocular movements intact sclera anicteric Neck supple trachea midline Lungs clear to auscultation bilaterally normal work of breathing at rest  on 2 liters Heart regular rate and rhythm no rubs or gallops appreciated Abdomen soft nontender distended Extremities  edema  Psych normal mood and affect   # CKD stage V - no emergent indication for dialysis - preference is for PD when/if RRT needed and he is an excellent candidate   # Acute on chronic diastolic CHF  - IV diuretics for now and hopeful for management with diuretics - lasix 80 mg IV BID today.  Metolazone 5 mg PO once this am.  Hope for transition to PO diuretics tomorrow  - For planning purposes he will need torsemide 40 mg BID on discharge   # Anemia CKD - defer ESA in setting of lung nodule for now   # HTN  - optimize volume with lasix and continue other current regimen    # Hypokalemia - improved   # metabolic bone disease - phos ok last check   # Lung lesion - follows with pulm   # Hypernatremia  - off of bicarb for now  - improved   # Constipation  - lactulose once today - additional assistance with constipation per primary team. Would avoid fleet's enemas   Disposition - would consider as early as 2/9 from a renal standpoint if renal function stable and no longer requiring oxygen   Claudia Desanctis, MD 09/29/2021  12:08 PM

## 2021-09-29 NOTE — Progress Notes (Signed)
Pt VS are stable.No respiratory distress noted. No BIPAP needed at this time

## 2021-09-29 NOTE — Plan of Care (Signed)
Full note to follow.    Still on 2 liters oxygen and states he wore mask last night as well. Give metolazone 5 mg po once and continue lasix IV today.  If improved transition to torsemide 40 mg PO BID tomorrow.    Constipation and normally on lactulose at home - will reorder here.  No BM yesterday.  Additional assistance for constipation per primary team.  No fleet's enemas   Would continue inpatient monitoring  Claudia Desanctis, MD 9:00 AM 09/29/2021

## 2021-09-29 NOTE — Progress Notes (Signed)
Progress Note   Patient: Austin [name] LKT:[id number] DOB: 08/24/1934 DOA: 09/26/2021     3 DOS: the patient was seen and examined on 09/29/2021   Brief hospital course: Mrs. Sevillano was admitted to the hospital with the working diagnosis of acute heart failure decompensation.   87 yo male with the past medical history of diastolic heart failure, HTN, T2DM, CVA, CKD V and OSA  who presented with worsening dyspnea and edema. Positive orthopnea and PND. On her initial physical examination her blood pressure was 222/83, HR 93, RR 22, temp 98,9 and oxygen saturation 91%, heart with S1 and S2 present and rhythmic, lungs with no rhonchi or wheezing, but increased work of breathing, abdomen soft and no lower extremity edema.   Na 144, K 3,7 CL 113, bicarbonate 18, glucose 181  BUN 66 and cr 4,3  BNP 3,670 Wbc 10,7, hgb 9,9, hct 31,6 and plt 280  SARS covid 19 negative  Urine analysis SG 1,020, protein > 300, 11-20 wbc  Chest radiograph with cardiomegaly with hilar vascular congestion bilaterally.   EKG 86 bpm, normal axis, 1st degree AV block, sinus rhythm with poor R wave progression, no significant ST segment or T wave changes.   Patient was placed on Bipap and admitted to the progressive care unit. Started on aggressive diuresis with IV furosemide.   Assessment and Plan: * Acute respiratory failure with hypoxia ([place])- (present on admission) Acute pulmonary edema due to volume overload.  Clinically improved dyspnea, his oxygenation today is 93% on 2 L/min  Continue diuresis with furosemide Plan to wean off supplemental 02 to room air.   Acute kidney injury superimposed on CKD ([place])- (present on admission) CKD stage V. Patient has been responding to diuretic therapy with improved volume status.  Urine output documented is 1,950 ml   Renal function with serum cr at 4,44 with K at 3,9 and serum bicarbonate at 21, Na 145.   Anemia of chronic kidney disease,hgb is 8,8   Acute on chronic  diastolic CHF (congestive heart failure) ([place])- (present on admission) Diastolic heart failure decompensated by progressive renal failure Continue diuresis and close blood pressure monitoring.   Type 2 diabetes mellitus with hyperlipidemia ([place])- (present on admission) Glucose is 114 fasting Continue with close monitoring with insulin sliding scale.   Continue with statin therapy.   Iron deficiency anemia due to chronic blood loss- (present on admission) Chronic anemia, hemoglobin is stable  Essential hypertension- (present on admission) Blood pressure 342 to 876 systolic.   Continue blood pressure control with amlodipine, clonidine, isosorbide and Bidil.  Diuresis with torsemide 40 mg po bid.   UTI (urinary tract infection)- (present on admission) Completed antibiotic therapy.   Pulmonary nodule 1 cm or greater in diameter- (present on admission) Needs follow-up  PROSTATE CANCER, HX OF - Monitor for urinary retention, follow-up with urology        Subjective: Patient is feeling better, no dyspnea or chest pain, edema has been improving.   Physical Exam: Vitals:   09/29/21 0459 09/29/21 0736 09/29/21 1043 09/29/21 1541  BP: (!) 159/64 (!) 185/66 (!) 190/75 (!) 158/66  Pulse: 65 70 79 64  Resp: 18 19 19 20   Temp: 98.7 F (37.1 C) 98.7 F (37.1 C) 98.6 F (37 C) 98.7 F (37.1 C)  TempSrc: Oral Oral Oral Oral  SpO2: 95% 95% 93% 95%  Weight:      Height:       Neurology patient is awake and alert ENT mild pallor \  Cardiovascular, with S1 and S2 present and rhythmic, no gallops or rubs No JVD Lower extremity edema +/++ bilaterally  Abdomen soft and non tender   Data Reviewed:  Reviewed   Family Communication: I spoke with patient's wife at the bedside, we talked in detail about patient's condition, plan of care and prognosis and all questions were addressed.   Disposition: Status is: Inpatient Remains inpatient appropriate because: renal failure  management.           Planned Discharge Destination: Home     Author: Tawni Millers, MD 09/29/2021 3:48 PM  For on call review [email or web address].

## 2021-09-29 NOTE — Assessment & Plan Note (Addendum)
Heart failure decompensation due to renal failure.  At the time of his discharge his volume status has improved.   Further work up with echocardiogram showed LV EF 40 to 45%, with global hypokinesis, and mild LVH. Preserved RV systolic function. Small pericardial effusion. Moderate to severe tricuspid regurgitation. Moderate aortic valve regurgitation.   Continue blood pressure control with clonidine, amlodipine, bidil and metoprolol. Continue diuresis with torsemide.

## 2021-09-29 NOTE — Hospital Course (Addendum)
Mrs. Austin [name] was admitted to the hospital with the working diagnosis of acute heart failure decompensation, related to AKI on CKD stage V.   87 yo male with the past medical history of diastolic heart failure, HTN, T2DM, CVA, CKD V and OSA  who presented with worsening dyspnea and edema. Positive orthopnea and PND. On his initial physical examination his blood pressure was 222/83, HR 93, RR 22, temp 98,9 and oxygen saturation 91%, heart with S1 and S2 present and rhythmic, lungs with no rhonchi or wheezing, but increased work of breathing, abdomen soft and positive lower extremity edema.   Na 144, K 3,7 CL 113, bicarbonate 18, glucose 181  BUN 66 and cr 4,3  BNP 3,670 Wbc 10,7, hgb 9,9, hct 31,6 and plt 280  SARS covid 19 negative  Urine analysis SG 1,020, protein > 300, 11-20 wbc  Chest radiograph with cardiomegaly with hilar vascular congestion bilaterally.   EKG 86 bpm, normal axis, 1st degree AV block, sinus rhythm with poor R wave progression, no significant ST segment or T wave changes.   Patient was placed on Bipap and admitted to the progressive care unit. Started on aggressive diuresis with IV furosemide.   With medical therapy his volume status improved, he was transitioned to nasal cannula supplemental 02 and the to room air with good toleration.  Patient will continue taking oral diuretic therapy at home and close follow up with nephrology as outpatient.  Eventually will need renal replacement therapy.

## 2021-09-30 ENCOUNTER — Other Ambulatory Visit (HOSPITAL_COMMUNITY): Payer: Self-pay

## 2021-09-30 LAB — GLUCOSE, CAPILLARY
Glucose-Capillary: 132 mg/dL — ABNORMAL HIGH (ref 70–99)
Glucose-Capillary: 133 mg/dL — ABNORMAL HIGH (ref 70–99)
Glucose-Capillary: 82 mg/dL (ref 70–99)
Glucose-Capillary: 97 mg/dL (ref 70–99)

## 2021-09-30 LAB — RENAL FUNCTION PANEL
Albumin: 2.9 g/dL — ABNORMAL LOW (ref 3.5–5.0)
Anion gap: 10 (ref 5–15)
BUN: 73 mg/dL — ABNORMAL HIGH (ref 8–23)
CO2: 24 mmol/L (ref 22–32)
Calcium: 8.6 mg/dL — ABNORMAL LOW (ref 8.9–10.3)
Chloride: 109 mmol/L (ref 98–111)
Creatinine, Ser: 4.51 mg/dL — ABNORMAL HIGH (ref 0.61–1.24)
GFR, Estimated: 12 mL/min — ABNORMAL LOW (ref >60)
Glucose, Bld: 69 mg/dL — ABNORMAL LOW (ref 70–99)
Phosphorus: 3.6 mg/dL (ref 2.5–4.6)
Potassium: 3 mmol/L — ABNORMAL LOW (ref 3.5–5.1)
Sodium: 143 mmol/L (ref 135–145)

## 2021-09-30 LAB — MAGNESIUM: Magnesium: 2.4 mg/dL (ref 1.7–2.4)

## 2021-09-30 MED ORDER — POTASSIUM CHLORIDE CRYS ER 20 MEQ PO TBCR
20.0000 meq | EXTENDED_RELEASE_TABLET | Freq: Every day | ORAL | Status: DC
Start: 2021-10-01 — End: 2021-09-30

## 2021-09-30 MED ORDER — POTASSIUM CHLORIDE CRYS ER 20 MEQ PO TBCR: 20.0000 meq | EXTENDED_RELEASE_TABLET | Freq: Two times a day (BID) | ORAL | Status: DC

## 2021-09-30 MED ORDER — POTASSIUM CHLORIDE CRYS ER 20 MEQ PO TBCR
40.0000 meq | EXTENDED_RELEASE_TABLET | Freq: Once | ORAL | Status: AC
  Administered 2021-09-30: 40 meq via ORAL
  Filled 2021-09-30: qty 2

## 2021-09-30 MED ORDER — RENA-VITE PO TABS
1.0000 | ORAL_TABLET | Freq: Every day | ORAL | 0 refills | Status: AC
  Filled 2021-09-30: qty 30, 30d supply, fill #0

## 2021-09-30 MED ORDER — TORSEMIDE 20 MG PO TABS
40.0000 mg | ORAL_TABLET | Freq: Two times a day (BID) | ORAL | 0 refills | Status: DC
  Filled 2021-09-30: qty 120, 30d supply, fill #0

## 2021-09-30 MED ORDER — ENSURE ENLIVE PO LIQD
237.0000 mL | Freq: Two times a day (BID) | ORAL | 0 refills | Status: AC
  Filled 2021-09-30: qty 14220, 30d supply, fill #0

## 2021-09-30 MED ORDER — CLONIDINE HCL 0.3 MG PO TABS: 0.3000 mg | ORAL_TABLET | Freq: Three times a day (TID) | ORAL | Status: DC

## 2021-09-30 MED ORDER — POTASSIUM CHLORIDE CRYS ER 20 MEQ PO TBCR
20.0000 meq | EXTENDED_RELEASE_TABLET | Freq: Two times a day (BID) | ORAL | 0 refills | Status: DC
  Filled 2021-09-30: qty 60, 30d supply, fill #0

## 2021-09-30 NOTE — Discharge Summary (Addendum)
Physician Discharge Summary   Patient: Austin [name] MRN: [id number] DOB: 1935/03/03  Admit date:     09/26/2021  Discharge date: 09/30/21  Discharge Physician: Jimmy Picket Lorianne Malbrough   PCP: Dorothyann Peng, NP   Recommendations at discharge:    Increased torsemide to 40 mg po bid Increased KCl to 20 meq bid Continue blood pressure control and close follow up with nephrology Patient will need eventually renal replacement therapy, peritoneal dialysis.  Discontinue glipizide due to risk of hypoglycemia.   I spoke over the phone with the patient's wife about patient's  condition, plan of care, prognosis and all questions were addressed.   Discharge Diagnoses: Principal Problem:   Acute respiratory failure with hypoxia (HCC) Active Problems:   Acute kidney injury superimposed on CKD (HCC)   Acute on chronic diastolic CHF (congestive heart failure) (HCC)   Type 2 diabetes mellitus with hyperlipidemia (HCC)   Iron deficiency anemia due to chronic blood loss   Essential hypertension   PROSTATE CANCER, HX OF   Pulmonary nodule 1 cm or greater in diameter   UTI (urinary tract infection)  Resolved Problems:   Dyslipidemia   Hospital Course: Austin [name] was admitted to the hospital with the working diagnosis of acute heart failure decompensation, related to AKI on CKD stage V.   87 yo male with the past medical history of diastolic heart failure, HTN, T2DM, CVA, CKD V and OSA  who presented with worsening dyspnea and edema. Positive orthopnea and PND. On his initial physical examination his blood pressure was 222/83, HR 93, RR 22, temp 98,9 and oxygen saturation 91%, heart with S1 and S2 present and rhythmic, lungs with no rhonchi or wheezing, but increased work of breathing, abdomen soft and positive lower extremity edema.   Na 144, K 3,7 CL 113, bicarbonate 18, glucose 181  BUN 66 and cr 4,3  BNP 3,670 Wbc 10,7, hgb 9,9, hct 31,6 and plt 280  SARS covid 19 negative  Urine analysis SG  1,020, protein > 300, 11-20 wbc  Chest radiograph with cardiomegaly with hilar vascular congestion bilaterally.   EKG 86 bpm, normal axis, 1st degree AV block, sinus rhythm with poor R wave progression, no significant ST segment or T wave changes.   Patient was placed on Bipap and admitted to the progressive care unit. Started on aggressive diuresis with IV furosemide.   With medical therapy his volume status improved, he was transitioned to nasal cannula supplemental 02 and the to room air with good toleration.  Patient will continue taking oral diuretic therapy at home and close follow up with nephrology as outpatient.  Eventually will need renal replacement therapy.   Assessment and Plan: * Acute respiratory failure with hypoxia ([facility])- (present on admission) Patient initially required non invasive mechanical ventilation, with aggressive medical therapy his volume status improved and he was transitioned to nasal cannula and then to room air.  At the time of his discharge his oxygenation is 91% to 94% on room air.   Acute kidney injury superimposed on CKD ([place])- (present on admission) CKD stage V. hypokalemia Patient with advanced renal disease, he tolerated well diuresis with furosemide.  His urine output over last 24 hrs has been 2,650 ml.   At his discharge his renal function has a serum cr of 4.51 with K at 3,0 and bicarbonate at 24.  Ca 8,6 and alb 2,9.  P is 3,6   Plan to continue blood pressure control and diuresis with torsemide 40 mg po bid  Anemia of chronic kidney disease,hgb is 8,8 follow up cell count as outpatient.   Acute on chronic diastolic CHF (congestive heart failure) ([place])- (present on admission) Heart failure decompensation due to renal failure.  At the time of his discharge his volume status has improved.   Further work up with echocardiogram showed LV EF 40 to 45%, with global hypokinesis, and mild LVH. Preserved RV systolic function. Small pericardial  effusion. Moderate to severe tricuspid regurgitation. Moderate aortic valve regurgitation.   Continue blood pressure control with clonidine, amlodipine, bidil and metoprolol. Continue diuresis with torsemide.   Type 2 diabetes mellitus with hyperlipidemia ([place])- (present on admission) His capillary glucose remained well controlled during his hospitalization. At home will hold on oral hypoglycemic agents with close follow up. In the setting of worsening renal function high risk of hypoglycemia.   Iron deficiency anemia due to chronic blood loss- (present on admission) Continue iron supplementation and close follow up as outpatient.   Essential hypertension- (present on admission) Continue blood pressure control with amlodipine, Bidil, metoprolol and diuresis with torsemide. Difficult to control hypertension.   UTI (urinary tract infection)- (present on admission) Completed antibiotic therapy.   Pulmonary nodule 1 cm or greater in diameter- (present on admission) CT from 08/2020 showed interval increase in size of right  Upper lobe subpleural nodule and recommendations to follow up with PAT scan.  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 Plan to repeat CT scan in 11/2021.   PROSTATE CANCER, HX OF No signs of urinary retention, plan to follow up as outpatient.            Consultants: nephrology  Procedures performed: none  Disposition: Home Diet recommendation:  Cardiac diet  DISCHARGE MEDICATION: Allergies as of 09/30/2021       Reactions   Aspirin Other (See Comments)   High doses causes stomach ulcer and bleeding        Medication List     STOP taking these medications    glipiZIDE 5 MG 24 hr tablet Commonly known as: GLUCOTROL XL   polyethylene glycol 17 g packet Commonly known as: MIRALAX / GLYCOLAX       TAKE these medications    Accu-Chek Aviva Plus w/Device  Kit Used to check blood glucose 2 times a day or PRN What changed:  when to take this additional instructions   Accu-Chek Guide test strip Generic drug: glucose blood USED TO CHECK BLOOD GLUCOSE TWICE A DAY OR AS NEEDED What changed: See the new instructions.   Accu-Chek Softclix Lancets lancets USED TO CHECK BLOOD GLUCOSE TWICE A DAY OR AS NEEDED What changed:  when to take this additional instructions   acetaminophen 325 MG tablet Commonly known as: TYLENOL Take 325 mg by mouth 4 (four) times daily.   albuterol 108 (90 Base) MCG/ACT inhaler Commonly known as: VENTOLIN HFA TAKE 2 PUFFS BY MOUTH EVERY 6 HOURS AS NEEDED FOR WHEEZE OR SHORTNESS OF BREATH What changed: See the new instructions.   amLODipine 10 MG tablet Commonly known as: NORVASC TAKE 1 TABLET BY MOUTH EVERY DAY   atorvastatin 20 MG tablet Commonly known as: LIPITOR TAKE 1 TABLET BY MOUTH EVERY DAY   bisacodyl 10 MG suppository Commonly known as: DULCOLAX Place 1 suppository (10 mg total) rectally daily as needed for mild constipation.   cholecalciferol 25 MCG (1000 UNIT) tablet Commonly known as: VITAMIN D3 Take 1,000 Units by mouth daily.   Citrucel oral powder Generic  drug: methylcellulose Take 1 packet by mouth See admin instructions. Every 2-3 days,alternating with lactulose   cloNIDine 0.3 MG tablet Commonly known as: CATAPRES Take 1 tablet (0.3 mg total) by mouth 3 (three) times daily.   clopidogrel 75 MG tablet Commonly known as: PLAVIX TAKE 1 TABLET BY MOUTH EVERY DAY   doxazosin 2 MG tablet Commonly known as: CARDURA Take 2 mg by mouth at bedtime.   esomeprazole 40 MG capsule Commonly known as: NEXIUM TAKE 1 CAPSULE BY MOUTH EVERY DAY What changed:  how much to take how to take this   feeding supplement Liqd Take 237 mLs by mouth 2 (two) times daily between meals.   hydrocortisone 2.5 % rectal cream Commonly known as: ANUSOL-HC PLACE 1 APPLICATION RECTALLY 2 TIMES A  DAY. What changed: See the new instructions.   isosorbide-hydrALAZINE 20-37.5 MG tablet Commonly known as: BIDIL Take 1 tablet by mouth 3 (three) times daily.   lactulose 10 GM/15ML solution Commonly known as: CHRONULAC TAKE 15 ML BY MOUTH 2 TIMES DAILY AS NEEDED FOR MILD CONSTIPATION. What changed: See the new instructions.   metoprolol succinate 100 MG 24 hr tablet Commonly known as: TOPROL-XL Take 1 tablet (100 mg total) by mouth daily. Take with or immediately following a meal.   multivitamin Tabs tablet Take 1 tablet by mouth at bedtime.   potassium chloride SA 20 MEQ tablet Commonly known as: KLOR-CON M Take 1 tablet (20 mEq total) by mouth 2 (two) times daily. What changed: medication strength   SYSTANE BALANCE OP Place 1 drop into both eyes at bedtime.   Torsemide 40 MG Tabs Take 40 mg by mouth 2 (two) times daily. What changed:  when to take this Another medication with the same name was removed. Continue taking this medication, and follow the directions you see here.   triamcinolone cream 0.1 % Commonly known as: KENALOG Apply 1 application topically daily as needed (dry/irritated skin).               Durable Medical Equipment  (From admission, onward)           Start     Ordered   09/30/21 1115  For home use only DME 4 wheeled rolling walker with seat  Once       Question:  Patient needs a walker to treat with the following condition  Answer:  Weakness   09/30/21 1115             Discharge Exam: Filed Weights   09/28/21 0355 09/29/21 0006 09/30/21 0012  Weight: 75.1 kg 76.8 kg 79.6 kg   Neurology patient is awake and alert ENT no pallor Cardiovascular with heart with S1 and S2 present and rhythmic, no rubs or gallops.  No JVD Trace non pitting bilateral lower extremity edema. Respiratory with no wheezing or rhonchi, no rales Abdomen soft and non tender   Condition at discharge: stable  The results of significant diagnostics  from this hospitalization (including imaging, microbiology, ancillary and laboratory) are listed below for reference.   Imaging Studies: DG Chest Portable 1 View  Result Date: 09/26/2021 CLINICAL DATA:  Shortness of breath, difficulty breathing EXAM: PORTABLE CHEST 1 VIEW COMPARISON:  07/09/2021 FINDINGS: Cardiomegaly with vascular congestion and early interstitial edema. No effusions or acute bony abnormality. IMPRESSION: Cardiomegaly, vascular congestion, early interstitial edema. Electronically Signed   By: Rolm Baptise M.D.   On: 09/26/2021 21:28   ECHOCARDIOGRAM COMPLETE  Result Date: 09/28/2021    ECHOCARDIOGRAM REPORT  Patient Name:   Austin [name] Date of Exam: 09/28/2021 Medical Rec #:  [id number]  Height:       67.0 in Accession #:    [id number] Weight:       165.6 lb Date of Birth:  Jun 14, 1935  BSA:          1.866 m Patient Age:    6 years   BP:           156/56 mmHg Patient Gender: M          HR:           77 bpm. Exam Location:  Inpatient Procedure: 2D Echo, 3D Echo, Cardiac Doppler and Color Doppler Indications:    CHF-Acute Diastolic W29.93  History:        Patient has prior history of Echocardiogram examinations, most                 recent 07/10/2021. CHF; Risk Factors:Hypertension, Dyslipidemia                 and Diabetes.  Sonographer:    Bernadene Person RDCS Referring Phys: Randall  1. Left ventricular ejection fraction, by estimation, is 40 to 45%. The left ventricle has mildly decreased function. The left ventricle demonstrates global hypokinesis. There is mild concentric left ventricular hypertrophy. Left ventricular diastolic parameters are consistent with Grade II diastolic dysfunction (pseudonormalization). Elevated left atrial pressure.  2. Right ventricular systolic function is normal. The right ventricular size is moderately enlarged. There is severely elevated pulmonary artery systolic pressure. The estimated right ventricular systolic pressure is 71.6  mmHg.  3. Left atrial size was moderately dilated.  4. Right atrial size was moderately dilated.  5. A small pericardial effusion is present. The pericardial effusion is circumferential. There is no evidence of cardiac tamponade.  6. The mitral valve is normal in structure. Mild mitral valve regurgitation.  7. Tricuspid valve regurgitation is moderate to severe.  8. The aortic valve is tricuspid. There is mild calcification of the aortic valve. There is mild thickening of the aortic valve. Aortic valve regurgitation is moderate. Aortic valve sclerosis is present, with no evidence of aortic valve stenosis.  9. There is borderline dilatation of the aortic root, measuring 37 mm. 10. The inferior vena cava is dilated in size with <50% respiratory variability, suggesting right atrial pressure of 15 mmHg. Comparison(s): Prior images reviewed side by side. The left ventricular function is worsened. Previous study also showed grade 2 diastolic dysfunction (elevated mean left atrial pressure). FINDINGS  Left Ventricle: Left ventricular ejection fraction, by estimation, is 40 to 45%. The left ventricle has mildly decreased function. The left ventricle demonstrates global hypokinesis. 3D left ventricular ejection fraction analysis performed but not reported based on interpreter judgement due to suboptimal tracking. The left ventricular internal cavity size was normal in size. There is mild concentric left ventricular hypertrophy. Left ventricular diastolic parameters are consistent with Grade II diastolic dysfunction (pseudonormalization). Elevated left atrial pressure. Right Ventricle: The right ventricular size is moderately enlarged. No increase in right ventricular wall thickness. Right ventricular systolic function is normal. There is severely elevated pulmonary artery systolic pressure. The tricuspid regurgitant velocity is 3.50 m/s, and with an assumed right atrial pressure of 15 mmHg, the estimated right ventricular  systolic pressure is 96.7 mmHg. Left Atrium: Left atrial size was moderately dilated. Right Atrium: Right atrial size was moderately dilated. Pericardium: A small pericardial effusion is present. The pericardial effusion is circumferential. There is  no evidence of cardiac tamponade. Mitral Valve: The mitral valve is normal in structure. Mild mitral annular calcification. Mild mitral valve regurgitation. Tricuspid Valve: The tricuspid valve is normal in structure. Tricuspid valve regurgitation is moderate to severe. Aortic Valve: The aortic valve is tricuspid. There is mild calcification of the aortic valve. There is mild thickening of the aortic valve. Aortic valve regurgitation is moderate. Aortic regurgitation PHT measures 325 msec. Aortic valve sclerosis is present, with no evidence of aortic valve stenosis. Pulmonic Valve: The pulmonic valve was normal in structure. Pulmonic valve regurgitation is not visualized. Aorta: The aortic root and ascending aorta are structurally normal, with no evidence of dilitation. There is borderline dilatation of the aortic root, measuring 37 mm. Venous: The inferior vena cava is dilated in size with less than 50% respiratory variability, suggesting right atrial pressure of 15 mmHg. IAS/Shunts: No atrial level shunt detected by color flow Doppler. Additional Comments: There is pleural effusion in the left lateral region.  LEFT VENTRICLE PLAX 2D LVIDd:         5.40 cm      Diastology LVIDs:         4.00 cm      LV e' medial:    3.77 cm/s LV PW:         1.20 cm      LV E/e' medial:  28.1 LV IVS:        1.20 cm      LV e' lateral:   4.45 cm/s LVOT diam:     2.10 cm      LV E/e' lateral: 23.8 LV SV:         97 LV SV Index:   52 LVOT Area:     3.46 cm                              3D Volume EF: LV Volumes (MOD)            3D EF:        54 % LV vol d, MOD A2C: 192.0 ml LV EDV:       245 ml LV vol d, MOD A4C: 168.0 ml LV ESV:       112 ml LV vol s, MOD A2C: 94.7 ml  LV SV:        133 ml  LV vol s, MOD A4C: 77.8 ml LV SV MOD A2C:     97.3 ml LV SV MOD A4C:     168.0 ml LV SV MOD BP:      93.6 ml RIGHT VENTRICLE RV S prime:     16.30 cm/s TAPSE (M-mode): 2.6 cm LEFT ATRIUM           Index        RIGHT ATRIUM           Index LA diam:      3.70 cm 1.98 cm/m   RA Area:     24.80 cm LA Vol (A4C): 85.7 ml 45.92 ml/m  RA Volume:   90.50 ml  48.49 ml/m  AORTIC VALVE LVOT Vmax:         151.00 cm/s LVOT Vmean:        108.000 cm/s LVOT VTI:          0.280 m AI PHT:            325 msec AR Vena Contracta: 0.30 cm  AORTA Ao Root diam:  3.70 cm Ao Asc diam:  3.70 cm MITRAL VALVE                TRICUSPID VALVE MV Area (PHT): 4.52 cm     TR Peak grad:   49.0 mmHg MV Decel Time: 168 msec     TR Vmax:        350.00 cm/s MR PISA:        0.25 cm MR PISA Radius: 0.20 cm     SHUNTS MV E velocity: 106.00 cm/s  Systemic VTI:  0.28 m MV A velocity: 54.70 cm/s   Systemic Diam: 2.10 cm MV E/A ratio:  1.94 Mihai Croitoru MD Electronically signed by Sanda Klein MD Signature Date/Time: 09/28/2021/10:40:15 AM    Final     Microbiology: Results for orders placed or performed during the hospital encounter of 09/26/21  Urine Culture     Status: Abnormal   Collection Time: 09/27/21  1:17 AM   Specimen: Urine, Clean Catch  Result Value Ref Range Status   Specimen Description URINE, CLEAN CATCH  Final   Special Requests NONE  Final   Culture (A)  Final    <10,000 COLONIES/mL INSIGNIFICANT GROWTH Performed at [facility], [address], [place], [place] [address]    Report Status 09/28/2021 FINAL  Final  Resp Panel by RT-PCR (Flu A&B, Covid) Nasopharyngeal Swab     Status: None   Collection Time: 09/27/21  4:56 AM   Specimen: Nasopharyngeal Swab; Nasopharyngeal(NP) swabs in vial transport medium  Result Value Ref Range Status   SARS Coronavirus 2 by RT PCR NEGATIVE NEGATIVE Final    Comment: (NOTE) SARS-CoV-2 target nucleic acids are NOT DETECTED.  The SARS-CoV-2 RNA is generally detectable in upper  respiratory specimens during the acute phase of infection. The lowest concentration of SARS-CoV-2 viral copies this assay can detect is 138 copies/mL. A negative result does not preclude SARS-Cov-2 infection and should not be used as the sole basis for treatment or other patient management decisions. A negative result may occur with  improper specimen collection/handling, submission of specimen other than nasopharyngeal swab, presence of viral mutation(s) within the areas targeted by this assay, and inadequate number of viral copies(<138 copies/mL). A negative result must be combined with clinical observations, patient history, and epidemiological information. The expected result is Negative.  Fact Sheet for Patients:  EntrepreneurPulse.com.au  Fact Sheet for Healthcare Providers:  IncredibleEmployment.be  This test is no t yet approved or cleared by the [place] FDA and  has been authorized for detection and/or diagnosis of SARS-CoV-2 by FDA under an Emergency Use Authorization (EUA). This EUA will remain  in effect (meaning this test can be used) for the duration of the COVID-19 declaration under Section 564(b)(1) of the Act, 21 U.S.C.section 360bbb-3(b)(1), unless the authorization is terminated  or revoked sooner.       Influenza A by PCR NEGATIVE NEGATIVE Final   Influenza B by PCR NEGATIVE NEGATIVE Final    Comment: (NOTE) The Xpert Xpress SARS-CoV-2/FLU/RSV plus assay is intended as an aid in the diagnosis of influenza from Nasopharyngeal swab specimens and should not be used as a sole basis for treatment. Nasal washings and aspirates are unacceptable for Xpert Xpress SARS-CoV-2/FLU/RSV testing.  Fact Sheet for Patients: EntrepreneurPulse.com.au  Fact Sheet for Healthcare Providers: IncredibleEmployment.be  This test is not yet approved or cleared by the [place] FDA and has been  authorized for detection and/or diagnosis of SARS-CoV-2 by FDA under an Emergency Use Authorization (  EUA). This EUA will remain in effect (meaning this test can be used) for the duration of the COVID-19 declaration under Section 564(b)(1) of the Act, 21 U.S.C. section 360bbb-3(b)(1), unless the authorization is terminated or revoked.  Performed at [facility], [place] [address], [place], [place] [address]     Labs: CBC: Recent Labs  Lab 09/26/21 2117 09/27/21 0119 09/27/21 0129 09/27/21 0800 09/28/21 0211 09/29/21 0322  WBC 10.7*  --   --  11.5* 9.3 8.4  NEUTROABS  --  8.2*  --  10.3*  --   --   HGB 9.9*  --  9.2* 9.5* 9.2* 8.8*  HCT 31.6*  --  27.0* 30.8* 28.7* 26.9*  MCV 86.1  --   --  86.8 84.2 84.1  PLT 280  --   --  244 226 642   Basic Metabolic Panel: Recent Labs  Lab 09/26/21 2117 09/27/21 0119 09/27/21 0129 09/27/21 0800 09/28/21 0211 09/29/21 0322 09/30/21 0224  NA 144  --  148* 147* 147* 145 143  K 3.7  --  3.7 3.4* 3.4* 3.9 3.0*  CL 113*  --   --  111 114* 112* 109  CO2 18*  --   --  22 21* 21* 24  GLUCOSE 181*  --   --  122* 103* 114* 69*  BUN 66*  --   --  64* 71* 74* 73*  CREATININE 4.33*  --   --  4.52* 4.33* 4.44* 4.51*  CALCIUM 9.1  --   --  8.9 8.9 8.5* 8.6*  MG 2.3  --   --  2.4  --   --  2.4  PHOS  --  4.1  --  4.2  --   --  3.6   Liver Function Tests: Recent Labs  Lab 09/27/21 0119 09/27/21 0800 09/28/21 0211 09/30/21 0224  AST 13* 12* 14*  --   ALT '13 13 10  ' --   ALKPHOS 81 84 77  --   BILITOT 0.4 0.6 0.4  --   PROT 7.1 6.8 6.6  --   ALBUMIN 3.4* 3.6 3.6 2.9*   CBG: Recent Labs  Lab 09/29/21 1614 09/29/21 1949 09/30/21 0009 09/30/21 0421 09/30/21 0751  GLUCAP 110* 159* 132* 82 97    Discharge time spent: greater than 30 minutes.  Signed: Tawni Millers, MD [facility] 09/30/2021

## 2021-09-30 NOTE — TOC Initial Note (Signed)
Transition of Care [facility]) - Initial/Assessment Note    Patient Details  Name: Austin [name] MRN: [id number] Date of Birth: Jun 24, 1935  Transition of Care [facility]) CM/SW Contact:    Zenon Mayo, RN Phone Number: 09/30/2021, 11:21 AM  Clinical Narrative:                 Patient is for dc today, NCM offered choice for HHPT, he states he has had [facility] and would like to continue with them.  NCM made referral to [facility] with Alvis Lemmings, he is able to take referral.  Soc will begin 24 to 48 hrs post dc.  Also Patient wants a rollator, ok with [employer] supplying this for him.  NCM made referral to [facility] with [employer] , this will be delivered to patient 's home.  Per patient  that will be better.    Expected Discharge Plan: [place] Barriers to Discharge: No Barriers Identified   Patient Goals and CMS Choice Patient states their goals for this hospitalization and ongoing recovery are:: return home with wife CMS Medicare.gov Compare Post Acute Care list provided to:: Patient Choice offered to / list presented to : Patient  Expected Discharge Plan and Services Expected Discharge Plan: [place]   Discharge Planning Services: CM Consult Post Acute Care Choice: Durable Medical Equipment, Home Health Living arrangements for the past 2 months: Single Family Home Expected Discharge Date: 09/30/21               DME Arranged: Gilford Rile rolling with seat DME Agency: [employer] Date DME Agency Contacted: 09/30/21 Time DME Agency Contacted: 1120 Representative spoke with at DME Agency: [place] Arranged: PT [place]: [place] Date [place]: 09/30/21 Time HH Agency Contacted: 1120 Representative spoke with at [place]: Tommi Rumps  Prior Living Arrangements/Services Living arrangements for the past 2 months: Single Family Home Lives with:: Spouse Patient language and need for interpreter reviewed:: Yes Do you feel safe going back to the place  where you live?: Yes      Need for Family Participation in Patient Care: Yes (Comment) Care giver support system in place?: Yes (comment) Current home services: DME (has [facility],) Criminal Activity/Legal Involvement Pertinent to Current Situation/Hospitalization: No - Comment as needed  Activities of Daily Living Home Assistive Devices/Equipment: None ADL Screening (condition at time of admission) Patient's cognitive ability adequate to safely complete daily activities?: Yes Is the patient deaf or have difficulty hearing?: No Does the patient have difficulty seeing, even when wearing glasses/contacts?: No Does the patient have difficulty concentrating, remembering, or making decisions?: No Patient able to express need for assistance with ADLs?: Yes Does the patient have difficulty dressing or bathing?: No Independently performs ADLs?: Yes (appropriate for developmental age) Does the patient have difficulty walking or climbing stairs?: Yes Weakness of Legs: None Weakness of Arms/Hands: None  Permission Sought/Granted                  Emotional Assessment Appearance:: Appears stated age Attitude/Demeanor/Rapport: Engaged Affect (typically observed): Appropriate Orientation: : Oriented to Self, Oriented to Place, Oriented to  Time, Oriented to Situation Alcohol / Substance Use: Not Applicable Psych Involvement: No (comment)  Admission diagnosis:  SOB (shortness of breath) [R06.02] CHF exacerbation (HCC) [I50.9] Acute on chronic congestive heart failure, unspecified heart failure type [facility]) [I50.9] Patient Active Problem List   Diagnosis Date Noted   UTI (urinary tract infection) 09/27/2021   CHF exacerbation ([place]) 09/26/2021  Acute respiratory failure with hypoxia (HCC) 09/26/2021   Acute on chronic diastolic CHF (congestive heart failure) ([place]) 07/09/2021   Dyspnea 06/01/2021   Upper airway cough syndrome 04/06/2021   Porokeratosis 03/26/2021   Pulmonary nodule 1 cm or  greater in diameter 10/22/2020   Acute on chronic renal failure ([place]) 10/09/2020   Mass of right lung 09/18/2020   Acute kidney injury superimposed on CKD ([place]) 08/04/2019   Elevated troponin 08/04/2019   Hypokalemia 08/04/2019   Hypertensive urgency 03/28/2019   Elevated serum immunoglobulin free light chains 02/20/2019   Gastrointestinal hemorrhage with melena    Melena 11/03/2016   Carotid artery stenosis 05/10/2010   Type 2 diabetes mellitus with hyperlipidemia ([place]) 09/19/2007   Iron deficiency anemia due to chronic blood loss 03/13/2007   GERD 03/13/2007   Essential hypertension 03/05/2007   DISEASE, CEREBROVASCULAR NEC 03/05/2007   PROSTATE CANCER, HX OF 03/05/2007   PCP:  Dorothyann Peng, NP Pharmacy:   [id number] - [place], [place] [address] [place] [address] Phone: [phone] Fax: [place] [address] [place] [address] Phone: [phone] Fax: [phone]     Social Determinants of Health (SDOH) Interventions    Readmission Risk Interventions Readmission Risk Prevention Plan 09/30/2021 07/13/2021  Transportation Screening Complete Complete  PCP or Specialist Appt within 3-5 Days Complete Complete  HRI or Home Care Consult Complete Complete  Social Work Consult for [place] Planning/Counseling Complete Complete  Palliative Care Screening Not Applicable Not Applicable  Medication Review Press photographer) Complete Complete  Some recent data might be hidden

## 2021-09-30 NOTE — Care Management Important Message (Signed)
Important Message  Patient Details  Name: Austin [name] MRN: [id number] Date of Birth: [birth year]   Medicare Important Message Given:  Yes     [name] 09/30/2021, 9:17 AM

## 2021-09-30 NOTE — Progress Notes (Signed)
Physical Therapy Treatment Patient Details Name: Austin [name] MRN: [id number] DOB: August 10, 1935 Today's Date: 09/30/2021   History of Present Illness 87 y.o. male who presented 09/26/21 with SOB and edema. Pt admitted with acute on chronic diastolic CHF, progressive CKD 5, and pulmonary edema. PMH: diastolic CHF, DM2 HTN, HLD, prostate CA hx    PT Comments    The pt remains eager to mobilize, but demos continued deficits in dynamic stability and activity tolerance that required up to 2L O2 for hallway ambulation and stairs and prompted trial of rollator to improve stability and endurance with prolonged ambulation. The pt was on RA upon my arrival with SpO2 88-92%, low of 86% after 25 ft ambulation so pt placed on 1L O2 which maintained SpO2 92-94% for gait, but sropped to 84% with stairs. Pt then placed on 2L for remainder of session with SpO2 >94%. The pt was educated on use of rollator and demos improved stability compared to ambulation without AD. Will continue to benefit from skilled PT acutely as well as HHPT to maximize recovery of independence and endurance.    Recommendations for follow up therapy are one component of a multi-disciplinary discharge planning process, led by the attending physician.  Recommendations may be updated based on patient status, additional functional criteria and insurance authorization.  Follow Up Recommendations  Home health PT     Assistance Recommended at Discharge Intermittent Supervision/Assistance  Patient can return home with the following Assistance with cooking/housework;Help with stairs or ramp for entrance   Equipment Recommendations  Rollator (4 wheels)    Recommendations for Other Services       Precautions / Restrictions Precautions Precautions: Fall;Other (comment) Precaution Comments: monitor SpO2, on 2L for mobility Restrictions Weight Bearing Restrictions: No     Mobility  Bed Mobility               General bed mobility  comments: pt OOB in recliner upon arrival    Transfers Overall transfer level: Needs assistance Equipment used: None Transfers: Sit to/from Stand Sit to Stand: Supervision           General transfer comment: Supervision for safety, no LOB.    Ambulation/Gait Ambulation/Gait assistance: Min guard Gait Distance (Feet): 25 Feet (+ 150 ft + 75 ft) Assistive device: None, Rollator (4 wheels) Gait Pattern/deviations: Step-through pattern, Decreased stride length, Staggering left Gait velocity: 0.5 m/s Gait velocity interpretation: 1.31 - 2.62 ft/sec, indicative of limited community ambulator   General Gait Details: pt with slow but mostly step-through gait with x3 mild staggering steps to L that he was able to correct. SpO2 89-92% on RA, low of 86% after 25 ft then given 1-2L O2 to maintain in 90s. tried rollator with improved stability   Stairs Stairs: Yes Stairs assistance: Min guard Stair Management: One rail Left, Step to pattern, Forwards Number of Stairs: 15 General stair comments: step-to pattern with increased time and rest. low of 84% on RA, 2L to maintain in 90s      Balance Overall balance assessment: Mild deficits observed, not formally tested                                          Cognition Arousal/Alertness: Awake/alert Behavior During Therapy: WFL for tasks assessed/performed Overall Cognitive Status: Within Functional Limits for tasks assessed  General Comments General comments (skin integrity, edema, etc.): pt SpO2 88-92% on RA,low of 86% with 25 ft ambulation. Then placed on 1L O2 with gait and maintained 90-94% until pt on stairs, SpO2 desat to 84% on 1L. recovered with standing rest and 2L for ambulation back to room 96-99% on 2L      Pertinent Vitals/Pain Pain Assessment Pain Assessment: Faces Faces Pain Scale: Hurts a little bit Pain Location: R knee Pain  Descriptors / Indicators: Discomfort, Sore Pain Intervention(s): Limited activity within patient's tolerance, Monitored during session, Repositioned     PT Goals (current goals can now be found in the care plan section) Acute Rehab PT Goals Patient Stated Goal: to get better PT Goal Formulation: With patient/family Time For Goal Achievement: 10/12/21 Potential to Achieve Goals: Good Progress towards PT goals: Progressing toward goals    Frequency    Min 3X/week      PT Plan Discharge plan needs to be updated;Frequency needs to be updated       AM-PAC PT "6 Clicks" Mobility   Outcome Measure  Help needed turning from your back to your side while in a flat bed without using bedrails?: None Help needed moving from lying on your back to sitting on the side of a flat bed without using bedrails?: None Help needed moving to and from a bed to a chair (including a wheelchair)?: A Little Help needed standing up from a chair using your arms (e.g., wheelchair or bedside chair)?: A Little Help needed to walk in hospital room?: A Little Help needed climbing 3-5 steps with a railing? : A Little 6 Click Score: 20    End of Session Equipment Utilized During Treatment: Oxygen;Gait belt Activity Tolerance: Patient tolerated treatment well Patient left: in chair Nurse Communication: Mobility status (O2 needs) PT Visit Diagnosis: Unsteadiness on feet (R26.81);Other abnormalities of gait and mobility (R26.89);Difficulty in walking, not elsewhere classified (R26.2)     Time: [id number] PT Time Calculation (min) (ACUTE ONLY): 37 min  Charges:  $Therapeutic Exercise: 23-37 mins                     [place], PT, DPT   Acute Rehabilitation Department Pager #: [phone]   Sandra Cockayne 09/30/2021, 9:42 AM

## 2021-09-30 NOTE — Progress Notes (Signed)
SATURATION QUALIFICATIONS: (This note is used to comply with regulatory documentation for home oxygen)  Patient Saturations on Room Air at Rest = 92%  Patient Saturations on Room Air while Ambulating = 86%  Patient Saturations on 1 Liters of oxygen while Ambulating = 94%  Please briefly explain why patient needs home oxygen: decrease oxygen with ambulation

## 2021-09-30 NOTE — TOC Transition Note (Addendum)
Transition of Care [facility]) - CM/SW Discharge Note   Patient Details  Name: Austin [name] MRN: [id number] Date of Birth: [birth year]  Transition of Care [facility]) CM/SW Contact:  Zenon Mayo, RN Phone Number: 09/30/2021, 11:35 AM   Clinical Narrative:    Patient is for dc today, states his wife will transport him home. He is set up with [facility] for [place].  MD will put in order.  Staff RN notified , patient wants his bp to be checked again.  Patient states he wants the rollator to be delivered to his home.  Patient will need home oxygen , NCM , informed Shelia with [employer], they will bring the oxygen to patient room prior to dc., since they are bringing the oxygen they will bring the rollator as well.    Final next level of care: Home w Home Health Services Barriers to Discharge: No Barriers Identified   Patient Goals and CMS Choice Patient states their goals for this hospitalization and ongoing recovery are:: return home with wife CMS Medicare.gov Compare Post Acute Care list provided to:: Patient Choice offered to / list presented to : Patient  Discharge Placement                       Discharge Plan and Services   Discharge Planning Services: CM Consult Post Acute Care Choice: Durable Medical Equipment, Home Health          DME Arranged: Walker rolling with seat, Oxygen DME Agency: [employer] Date DME Agency Contacted: 09/30/21 Time DME Agency Contacted: 7001 Representative spoke with at DME Agency: [place]: PT [place]: [place] Date [place]: 09/30/21 Time [place]: 1120 Representative spoke with at [place]: [place] ([place]) Interventions     Readmission Risk Interventions Readmission Risk Prevention Plan 09/30/2021 07/13/2021  Transportation Screening Complete Complete  PCP or Specialist Appt within 3-5 Days Complete Complete  HRI or [place] Complete Complete  Social Work  Consult for [place] Planning/Counseling Complete Complete  Palliative Care Screening Not Applicable Not Applicable  Medication Review Press photographer) Complete Complete  Some recent data might be hidden

## 2021-09-30 NOTE — Progress Notes (Addendum)
Occupational Therapy Treatment Patient Details Name: Austin [name] MRN: [id number] DOB: [birth year] Today's Date: 09/30/2021   History of present illness 87 y.o. male who presented 09/26/21 with SOB and edema. Pt admitted with acute on chronic diastolic CHF, progressive CKD 5, and pulmonary edema. PMH: diastolic CHF, DM2 HTN, HLD, prostate CA hx   OT comments  Pt progressing towards established OT goals. Provided pt with handout and education on energy conservation for ADLs and IADLs. Pt verbalized understanding and ways he would implement into his daily routine. Pt performing functional mobility to recliner with Supervision. VSS on RA. Continue to recommend dc to home once medically stable per physician. Pt would benefit from shower seat for safety and activity tolerance with bathing. Will continue to follow to address decreased activity tolerance.   Recommendations for follow up therapy are one component of a multi-disciplinary discharge planning process, led by the attending physician.  Recommendations may be updated based on patient status, additional functional criteria and insurance authorization.    Follow Up Recommendations  No OT follow up    Assistance Recommended at Discharge None  Patient can return home with the following      Equipment Recommendations  Tub/shower seat    Recommendations for Other Services      Precautions / Restrictions Precautions Precautions: Fall;Other (comment) Precaution Comments: monitor SpO2       Mobility Bed Mobility Overal bed mobility: Modified Independent             General bed mobility comments: Extra time and use of bed rails, HOB elevated.    Transfers Overall transfer level: Needs assistance Equipment used: None Transfers: Sit to/from Stand Sit to Stand: Supervision           General transfer comment: Supervision for safety, no LOB.     Balance Overall balance assessment: Mild deficits observed, not formally tested                                          ADL either performed or assessed with clinical judgement   ADL Overall ADL's : Needs assistance/impaired                         Toilet Transfer: Ambulation;Supervision/safety           Functional mobility during ADLs: Supervision/safety General ADL Comments: Providing education and handout for energy conservation. Pt verbalized understanding and ways he would implement into his daily schedule. Pt then performing mobility to recliner    Extremity/Trunk Assessment Upper Extremity Assessment Upper Extremity Assessment: Overall WFL for tasks assessed   Lower Extremity Assessment Lower Extremity Assessment: Defer to PT evaluation        Vision       Perception     Praxis      Cognition Arousal/Alertness: Awake/alert Behavior During Therapy: WFL for tasks assessed/performed Overall Cognitive Status: Within Functional Limits for tasks assessed                                          Exercises      Shoulder Instructions       General Comments VSS on RA    Pertinent Vitals/ Pain       Pain Assessment Pain Assessment: Faces Faces Pain Scale:  No hurt Pain Intervention(s): Monitored during session  Home Living                                          Prior Functioning/Environment              Frequency  Min 2X/week        Progress Toward Goals  OT Goals(current goals can now be found in the care plan section)  Progress towards OT goals: Progressing toward goals  Acute Rehab OT Goals OT Goal Formulation: With patient Time For Goal Achievement: 10/12/21 Potential to Achieve Goals: Good ADL Goals Additional ADL Goal #1: pt will demonstrate two energy conservation techniques during adl task MOD I  Plan Discharge plan remains appropriate    Co-evaluation                 AM-PAC OT "6 Clicks" Daily Activity     Outcome Measure   Help from  another person eating meals?: None Help from another person taking care of personal grooming?: None Help from another person toileting, which includes using toliet, bedpan, or urinal?: None Help from another person bathing (including washing, rinsing, drying)?: None Help from another person to put on and taking off regular upper body clothing?: None Help from another person to put on and taking off regular lower body clothing?: A Little 6 Click Score: 23    End of Session    OT Visit Diagnosis: Unsteadiness on feet (R26.81);Muscle weakness (generalized) (M62.81)   Activity Tolerance Patient tolerated treatment well   Patient Left in chair;with chair alarm set   Nurse Communication Mobility status;Precautions        Time: [phone] OT Time Calculation (min): 27 min  Charges: OT General Charges $OT Visit: 1 Visit OT Treatments $Self Care/Home Management : 23-37 mins  [place], OTR/L Acute Rehab Pager: [phone] Office: [place] 09/30/2021, 8:38 AM

## 2021-09-30 NOTE — Progress Notes (Signed)
[facility] Progress Note  Name: Tyjae Issa MRN: [id number] DOB: [birth year]  Chief Complaint:  Shortness of breath   Subjective:  He had 2.7 liters UOP over 2/8.  He was weaned to room air in the interim. Last weight different than the rest and suspect not accurate?  He is normally on clonidine 0.3 mg TID and has been getting clonidine 0.2 mg BID here.  He is worried about his blood pressure.  States he desat'd with PT but has been off of oxygen this am when at rest.   Review of systems:  Denies shortness of breath or chest pain  Denies n/v Had BM yesterday    Intake/Output Summary (Last 24 hours) at 09/30/2021 0904 Last data filed at 09/30/2021 0052 Gross per 24 hour  Intake 720 ml  Output 2275 ml  Net -1555 ml    Vitals:  Vitals:   09/29/21 1952 09/29/21 2130 09/30/21 0012 09/30/21 0420  BP: (!) 172/68  (!) 162/64 (!) 176/62  Pulse: 64  64 69  Resp: 15  18 18   Temp: 98.8 F (37.1 C)  98.6 F (37 C) 98.4 F (36.9 C)  TempSrc: Oral  Oral Oral  SpO2: 91% 94% 92% 91%  Weight:   79.6 kg   Height:         Physical Exam:  General adult male in bed in no acute distress  HEENT normocephalic atraumatic extraocular movements intact sclera anicteric Neck supple trachea midline Lungs rare basilar crackles; normal work of breathing at rest on room air  Heart S1S2 no rub Abdomen soft nontender distended Extremities no edema  Psych normal mood and affect  Medications reviewed   Labs:  BMP Latest Ref Rng & Units 09/30/2021 09/29/2021 09/28/2021  Glucose 70 - 99 mg/dL 69(L) 114(H) 103(H)  BUN 8 - 23 mg/dL 73(H) 74(H) 71(H)  Creatinine 0.61 - 1.24 mg/dL 4.51(H) 4.44(H) 4.33(H)  BUN/Creat Ratio 6 - 22 (calc) - - -  Sodium 135 - 145 mmol/L 143 145 147(H)  Potassium 3.5 - 5.1 mmol/L 3.0(L) 3.9 3.4(L)  Chloride 98 - 111 mmol/L 109 112(H) 114(H)  CO2 22 - 32 mmol/L 24 21(L) 21(L)  Calcium 8.9 - 10.3 mg/dL 8.6(L) 8.5(L) 8.9     Assessment/Plan:  # CKD stage V - no  emergent indication for dialysis - preference is for PD when/if RRT needed and he is an excellent candidate   # Acute on chronic diastolic CHF  - Transition to PO diuretics today torsemide 40 mg BID - For planning purposes he will need torsemide 40 mg BID on discharge   # Anemia CKD - defer ESA in setting of lung nodule for now   # HTN  - increase clonidine back to his home dose of 0.3 mg TID   # Hypokalemia - he is on potassium 20 meq daily at home. Would increase to 20 meq BID. Mag is replete - potassium 40 meq PO once now    # metabolic bone disease - phos ok    # Lung lesion - follows with pulm   # Hypernatremia  - improved - off of bicarb    # Constipation  - s/p lactulose on 2/8 with BM.  additional assistance with constipation per primary team. Would avoid fleet's enemas     Disposition - per primary team discretion.  As early as 2/9 from a strictly renal standpoint.  He states he has follow-up with me on 2/15  Claudia Desanctis, MD 09/30/2021 9:04 AM

## 2021-10-01 ENCOUNTER — Ambulatory Visit: Payer: Medicare PPO | Admitting: Podiatry

## 2021-10-04 ENCOUNTER — Telehealth: Payer: Self-pay

## 2021-10-04 ENCOUNTER — Ambulatory Visit (INDEPENDENT_AMBULATORY_CARE_PROVIDER_SITE_OTHER): Payer: Medicare PPO

## 2021-10-04 DIAGNOSIS — I5033 Acute on chronic diastolic (congestive) heart failure: Secondary | ICD-10-CM

## 2021-10-04 DIAGNOSIS — I10 Essential (primary) hypertension: Secondary | ICD-10-CM

## 2021-10-04 DIAGNOSIS — N184 Chronic kidney disease, stage 4 (severe): Secondary | ICD-10-CM

## 2021-10-04 DIAGNOSIS — E1122 Type 2 diabetes mellitus with diabetic chronic kidney disease: Secondary | ICD-10-CM

## 2021-10-04 NOTE — Chronic Care Management (AMB) (Signed)
Chronic Care Management   CCM RN Visit Note  10/04/2021 Name: Austin [name] MRN: [id number] DOB: 09/01/1934  Subjective: Austin [name] is a 87 y.o. year old male who is a primary care patient of Dorothyann Peng, NP. The care management team was consulted for assistance with disease management and care coordination needs.    Engaged with patient by telephone for follow up visit in response to provider referral for case management and/or care coordination services.   Consent to Services:  The patient was given information about Chronic Care Management services, agreed to services, and gave verbal consent prior to initiation of services.  Please see initial visit note for detailed documentation.   Patient agreed to services and verbal consent obtained.   Assessment: Review of patient past medical history, allergies, medications, health status, including review of consultants reports, laboratory and other test data, was performed as part of comprehensive evaluation and provision of chronic care management services.   SDOH (Social Determinants of Health) assessments and interventions performed:    CCM Care Plan  Allergies  Allergen Reactions   Aspirin Other (See Comments)    High doses causes stomach ulcer and bleeding    Outpatient Encounter Medications as of 10/04/2021  Medication Sig Note   ACCU-CHEK GUIDE test strip USED TO CHECK BLOOD GLUCOSE TWICE A DAY OR AS NEEDED (Patient taking differently: in the morning and at bedtime.)    Accu-Chek Softclix Lancets lancets USED TO CHECK BLOOD GLUCOSE TWICE A DAY OR AS NEEDED (Patient taking differently: in the morning and at bedtime.)    acetaminophen (TYLENOL) 325 MG tablet Take 325 mg by mouth 4 (four) times daily.    albuterol (VENTOLIN HFA) 108 (90 Base) MCG/ACT inhaler TAKE 2 PUFFS BY MOUTH EVERY 6 HOURS AS NEEDED FOR WHEEZE OR SHORTNESS OF BREATH (Patient taking differently: Inhale 2 puffs into the lungs every 6 (six) hours as needed for  shortness of breath or wheezing.)    amLODipine (NORVASC) 10 MG tablet TAKE 1 TABLET BY MOUTH EVERY DAY (Patient taking differently: Take 10 mg by mouth daily.)    atorvastatin (LIPITOR) 20 MG tablet TAKE 1 TABLET BY MOUTH EVERY DAY (Patient taking differently: Take 20 mg by mouth daily.)    bisacodyl (DULCOLAX) 10 MG suppository Place 1 suppository (10 mg total) rectally daily as needed for mild constipation.    Blood Glucose Monitoring Suppl (ACCU-CHEK AVIVA PLUS) w/Device KIT Used to check blood glucose 2 times a day or PRN (Patient taking differently: in the morning and at bedtime.)    cholecalciferol (VITAMIN D3) 25 MCG (1000 UNIT) tablet Take 1,000 Units by mouth daily.    cloNIDine (CATAPRES) 0.3 MG tablet Take 1 tablet (0.3 mg total) by mouth 3 (three) times daily.    clopidogrel (PLAVIX) 75 MG tablet TAKE 1 TABLET BY MOUTH EVERY DAY (Patient taking differently: Take 75 mg by mouth daily.)    doxazosin (CARDURA) 2 MG tablet Take 2 mg by mouth at bedtime.     esomeprazole (NEXIUM) 40 MG capsule TAKE 1 CAPSULE BY MOUTH EVERY DAY (Patient taking differently: 40 mg daily.)    feeding supplement (ENSURE ENLIVE / ENSURE PLUS) LIQD Take 237 mLs by mouth 2 (two) times daily between meals.    hydrocortisone (ANUSOL-HC) 2.5 % rectal cream PLACE 1 APPLICATION RECTALLY 2 TIMES A DAY. (Patient taking differently: Place 1 application rectally 2 (two) times daily.) 09/26/2021: Needs a refill   isosorbide-hydrALAZINE (BIDIL) 20-37.5 MG tablet Take 1 tablet by mouth 3 (three) times  daily.    lactulose (CHRONULAC) 10 GM/15ML solution TAKE 15 ML BY MOUTH 2 TIMES DAILY AS NEEDED FOR MILD CONSTIPATION. (Patient taking differently: Take 10 g by mouth See admin instructions. Every 2-3 days, alternating with Citrucel)    methylcellulose (CITRUCEL) oral powder Take 1 packet by mouth See admin instructions. Every 2-3 days,alternating with lactulose    metoprolol succinate (TOPROL-XL) 100 MG 24 hr tablet Take 1 tablet  (100 mg total) by mouth daily. Take with or immediately following a meal.    multivitamin (RENA-VIT) TABS tablet Take 1 tablet by mouth at bedtime.    potassium chloride SA (KLOR-CON M) 20 MEQ tablet Take 1 tablet (20 mEq total) by mouth 2 (two) times daily.    Propylene Glycol (SYSTANE BALANCE OP) Place 1 drop into both eyes at bedtime.    torsemide (DEMADEX) 20 MG tablet Take 2 tablets (40 mg total) by mouth 2 (two) times daily.    triamcinolone cream (KENALOG) 0.1 % Apply 1 application topically daily as needed (dry/irritated skin).    Facility-Administered Encounter Medications as of 10/04/2021  Medication   ammonium lactate (LAC-HYDRIN) 12 % lotion    Patient Active Problem List   Diagnosis Date Noted   UTI (urinary tract infection) 09/27/2021   CHF exacerbation ([place]) 09/26/2021   Acute respiratory failure with hypoxia ([place]) 09/26/2021   Acute on chronic diastolic CHF (congestive heart failure) ([place]) 07/09/2021   Dyspnea 06/01/2021   Upper airway cough syndrome 04/06/2021   Porokeratosis 03/26/2021   Pulmonary nodule 1 cm or greater in diameter 10/22/2020   Acute on chronic renal failure ([place]) 10/09/2020   Mass of right lung 09/18/2020   Acute kidney injury superimposed on CKD ([place]) 08/04/2019   Elevated troponin 08/04/2019   Hypokalemia 08/04/2019   Hypertensive urgency 03/28/2019   Elevated serum immunoglobulin free light chains 02/20/2019   Gastrointestinal hemorrhage with melena    Melena 11/03/2016   Carotid artery stenosis 05/10/2010   Type 2 diabetes mellitus with hyperlipidemia ([place]) 09/19/2007   Iron deficiency anemia due to chronic blood loss 03/13/2007   GERD 03/13/2007   Essential hypertension 03/05/2007   DISEASE, CEREBROVASCULAR NEC 03/05/2007   PROSTATE CANCER, HX OF 03/05/2007    Conditions to be addressed/monitored:CHF, HTN, HLD, DMII, and CKD Stage 4  Care Plan : RN Care Manager Plan of Care  Updates made by Dimitri Ped, RN since 10/04/2021 12:00  AM     Problem: Chronic Disease Management and Care Coordination Needs (CHF, CKD4, DM,HTN and HLD)   Priority: High     Long-Range Goal: Establish Plan of Care for Chronic Disease Management Needs (CHF, CKD4, DM,HTN and HLD)   Start Date: 09/02/2021  Expected End Date: 08/30/2022  Priority: High  Note:   Current Barriers:  Knowledge Deficits related to plan of care for management of CHF, HTN, HLD, DMII, and CKD Stage 4  Chronic Disease Management support and education needs related to CHF, HTN, HLD, DMII, and CKD Stage 4  States he his breathing has been good since he got home from the hospital.  States he has not been using the O2 they sent home with him.  States he is trying to weigh every day and he weighted 172 this morning.  States home health is coming to visit him today.  States he is to see nephrology on 10/06/21 and he is waiting to hear back from Dr. Zenia Resides office for an appointment with him.  States his B/P was 143/67 today.  States his  CBG was 119 and it has been less than 130 with no low readings.  RNCM Clinical Goal(s):  Patient will verbalize understanding of plan for management of CHF, HTN, HLD, DMII, and CKD Stage 4 as evidenced by voiced adherence to plan of care verbalize basic understanding of  CHF, HTN, HLD, DMII, and CKD Stage 4 disease process and self health management plan as evidenced by voiced understanding and teach back take all medications exactly as prescribed and will call provider for medication related questions as evidenced by dispense report and pt verbalization attend all scheduled medical appointments: Pulmonology/sleep 11/02/21,Nephrology 10/06/21, cardiology 12/01/21 as evidenced by medical records demonstrate Improved adherence to prescribed treatment plan for CHF, HTN, HLD, DMII, and CKD Stage 4 as evidenced by readings within limits, voiced adherence to plan of care continue to work with RN Care Manager to address care management and care coordination  needs related to  CHF, HTN, HLD, DMII, and CKD Stage 4 as evidenced by adherence to CM Team Scheduled appointments not experience hospital admission as evidenced by review of EMR. Hospital Admissions in last 6 months = 2 demonstrate a decrease in CHF, HTN, HLD, DMII, and CKD Stage 4 exacerbations as evidenced by pt reports and no hospitalizations  through collaboration with RN Care manager, provider, and care team.   Interventions: 1:1 collaboration with primary care provider regarding development and update of comprehensive plan of care as evidenced by provider attestation and co-signature Inter-disciplinary care team collaboration (see longitudinal plan of care) Evaluation of current treatment plan related to  self management and patient's adherence to plan as established by provider   Heart Failure Interventions:  (Status:  Goal on track:  NO.) Long Term Goal Basic overview and discussion of pathophysiology of Heart Failure reviewed Provided education on low sodium diet Reviewed Heart Failure Action Plan in depth and provided written copy Assessed need for readable accurate scales in home Discussed importance of daily weight and advised patient to weigh and record daily Reviewed role of diuretics in prevention of fluid overload and management of heart failure; Discussed the importance of keeping all appointments with provider Reinforced importance of weighting daily and when to call provider, Reviewed to watch for increased shortness of breath ,swelling in legs and abdomen to notify provider   Chronic Kidney Disease Interventions:  (Status:  New goal. and Goal on track:  Yes.) Long Term Goal Assessed the Patient understanding of chronic kidney disease    Evaluation of current treatment plan related to chronic kidney disease self management and patient's adherence to plan as established by provider      Provided education to patient re: stroke prevention, s/s of heart attack and stroke     Reviewed prescribed diet low sodium low CHO Advised patient, providing education and rationale, to monitor blood pressure daily and record, calling PCP for findings outside established parameters    Reviewed to keep appointment with nephrologist on 10/07/21 Last practice recorded BP readings:  BP Readings from Last 3 Encounters:  09/30/21 (!) 158/61  07/21/21 140/80  07/14/21 (!) 163/58  Most recent eGFR/CrCl: No results found for: EGFR  No components found for: CRCL    Diabetes Interventions:  (Status:  Goal on track:  Yes.) Long Term Goal Assessed patient's understanding of A1c goal: <7% Provided education to patient about basic DM disease process Reviewed medications with patient and discussed importance of medication adherence Discussed plans with patient for ongoing care management follow up and provided patient with direct contact information for  care management team Provided patient with written educational materials related to hypo and hyperglycemia and importance of correct treatment Reviewed scheduled/upcoming provider appointments including: Pulmonology/sleep 11/02/21,Nephrology 10/06/21, cardiology 12/01/21 Advised patient, providing education and rationale, to check cbg daily and record, calling provider for findings outside established parameters Reviewed to stop glipizide as ordered post discharge and to discuss with nephrologist to see if he needs to restart Lab Results  Component Value Date   HGBA1C 6.1 (H) 09/27/2021   Hyperlipidemia Interventions:  (Status:  Goal on track:  Yes.) Long Term Goal Medication review performed; medication list updated in electronic medical record.  Provider established cholesterol goals reviewed Counseled on importance of regular laboratory monitoring as prescribed Reviewed role and benefits of statin for ASCVD risk reduction Reviewed importance of limiting foods high in cholesterol  Hypertension Interventions:  (Status:  Goal on track:   Yes.) Long Term Goal Last practice recorded BP readings:  BP Readings from Last 3 Encounters:  09/30/21 (!) 158/61  07/21/21 140/80  07/14/21 (!) 163/58  Most recent eGFR/CrCl: No results found for: EGFR  No components found for: CRCL  Evaluation of current treatment plan related to hypertension self management and patient's adherence to plan as established by provider Provided education to patient re: stroke prevention, s/s of heart attack and stroke Discussed plans with patient for ongoing care management follow up and provided patient with direct contact information for care management team Advised patient, providing education and rationale, to monitor blood pressure daily and record, calling PCP for findings outside established parameters Discussed complications of poorly controlled blood pressure such as heart disease, stroke, circulatory complications, vision complications, kidney impairment, sexual dysfunction  Patient Goals/Self-Care Activities: Take all medications as prescribed Attend all scheduled provider appointments Call pharmacy for medication refills 3-7 days in advance of running out of medications Call provider office for new concerns or questions  call office if I gain more than 2 pounds in one day or 5 pounds in one week keep legs up while sitting track weight in diary watch for swelling in feet, ankles and legs every day weigh myself daily follow rescue plan if symptoms flare-up eat more whole grains, fruits and vegetables, lean meats and healthy fats keep appointment with eye doctor check blood sugar at prescribed times: once daily and when you have symptoms of low or high blood sugar check feet daily for cuts, sores or redness take the blood sugar log to all doctor visits fill half of plate with vegetables manage portion size switch to sugar-free drinks keep feet up while sitting check blood pressure daily choose a place to take my blood pressure (home,  clinic or office, retail store) take blood pressure log to all doctor appointments call doctor for signs and symptoms of high blood pressure eat more whole grains, fruits and vegetables, lean meats and healthy fats limit salt intake to 2065m/day call for medicine refill 2 or 3 days before it runs out take all medications exactly as prescribed call doctor with any symptoms you believe are related to your medicine  Follow Up Plan:  Telephone follow up appointment with care management team member scheduled for:  10/25/21/23 The patient has been provided with contact information for the care management team and has been advised to call with any health related questions or concerns.       Plan:Telephone follow up appointment with care management team member scheduled for:  10/25/21 The patient has been provided with contact information for the care management team and  has been advised to call with any health related questions or concerns.  Peter Garter RN, Jackquline Denmark, CDE Care Management Coordinator [facility] [phone]

## 2021-10-04 NOTE — Telephone Encounter (Addendum)
Transition Care Management Unsuccessful Follow-up Telephone Call  Date of discharge and from where:  [facility] 09-30-21 Dx: acute respiratory failure with hypoxia  Attempts:  1st Attempt  Reason for unsuccessful TCM follow-up call:  Unable to leave message  Transition Care Management Unsuccessful Follow-up Telephone Call  Date of discharge and from where:  [place] 09-30-21 Dx: acute respiratory failure with hypoxia  Attempts:  2nd Attempt  Reason for unsuccessful TCM follow-up call:  Left voice message  Transition Care Management Unsuccessful Follow-up Telephone Call TCM [facility] 09-30-21 Dx: acute respiratory failure with hypoxia Date of discharge and from where:    Attempts:  3rd Attempt  Reason for unsuccessful TCM follow-up call:  Unable to leave message

## 2021-10-04 NOTE — Patient Instructions (Addendum)
Visit Information  Thank you for taking time to visit with me today. Please don't hesitate to contact me if I can be of assistance to you before our next scheduled telephone appointment.  Following are the goals we discussed today:  Take all medications as prescribed Attend all scheduled provider appointments Call pharmacy for medication refills 3-7 days in advance of running out of medications Call provider office for new concerns or questions  call office if I gain more than 2 pounds in one day or 5 pounds in one week keep legs up while sitting track weight in diary watch for swelling in feet, ankles and legs every day weigh myself daily follow rescue plan if symptoms flare-up eat more whole grains, fruits and vegetables, lean meats and healthy fats keep appointment with eye doctor check blood sugar at prescribed times: once daily and when you have symptoms of low or high blood sugar check feet daily for cuts, sores or redness take the blood sugar log to all doctor visits fill half of plate with vegetables manage portion size switch to sugar-free drinks keep feet up while sitting check blood pressure daily choose a place to take my blood pressure (home, clinic or office, retail store) take blood pressure log to all doctor appointments call doctor for signs and symptoms of high blood pressure eat more whole grains, fruits and vegetables, lean meats and healthy fats limit salt intake to 2000mg /day call for medicine refill 2 or 3 days before it runs out take all medications exactly as prescribed call doctor with any symptoms you believe are related to your medicine Heart Failure, Self-Care Heart failure is a serious condition. The following information explains things you need to do to take care of yourself at home. To help you stay as healthy as possible, you may be asked to change your diet, take certain medicines, and make other changes in your life. Your doctor may also give you  more specific instructions. If you have problems or questions, call your doctor. What are the risks? Having heart failure makes it more likely for you to have some problems. These problems can get worse if you do not take good care of yourself. Problems may include: Damage to the kidneys, liver, or lungs. Malnutrition. Abnormal heart rhythms. Blood clotting problems that could cause a stroke. Supplies needed: Scale for weighing yourself. Blood pressure monitor. Notebook. Medicines. How to care for yourself when you have heart failure Medicines Take over-the-counter and prescription medicines only as told by your doctor. Take your medicines every day. Do not stop taking your medicine unless your doctor tells you to do so. Do not skip any medicines. Get your prescriptions refilled before you run out of medicine. This is important. Talk with your doctor if you cannot afford your medicines. Eating and drinking  Eat heart-healthy foods. Talk with a diet specialist (dietitian) to create an eating plan. Limit salt (sodium) if told by your doctor. Ask your diet specialist to tell you which seasonings are healthy for your heart. Cook in healthy ways instead of frying. Healthy ways of cooking include roasting, grilling, broiling, baking, poaching, steaming, and stir-frying. Choose foods that: Have no trans fat. Are low in saturated fat and cholesterol. Choose healthy foods, such as: Fresh or frozen fruits and vegetables. Fish. Low-fat (lean) meats. Legumes, such as beans, peas, and lentils. Fat-free or low-fat dairy products. Whole-grain foods. High-fiber foods. Limit how much fluid you drink, if told by your doctor. Alcohol use Do not drink alcohol  if: Your doctor tells you not to drink. Your heart was damaged by alcohol, or you have very bad heart failure. You are pregnant, may be pregnant, or are planning to become pregnant. If you drink alcohol: Limit how much you have to: 0-1  drink a day for women. 0-2 drinks a day for men. Know how much alcohol is in your drink. In the [place] one drink equals one 12 oz bottle of beer (355 mL), one 5 oz glass of wine (148 mL), or one 1 oz glass of hard liquor (44 mL). Lifestyle  Do not smoke or use any products that contain nicotine or tobacco. If you need help quitting, ask your doctor. Do not use nicotine gum or patches before talking to your doctor. Do not use illegal drugs. Lose weight if told by your doctor. Do physical activity if told by your doctor. Talk to your doctor before you begin an exercise if: You are an older adult. You have very bad heart failure. Learn to manage stress. If you need help, ask your doctor. Get physical rehab (rehabilitation) to help you stay independent and to help with your quality of life. Participate in a cardiac rehab program. This program helps you improve your health through exercise, education, and counseling. Plan time to rest when you get tired. Check weight and blood pressure  Weigh yourself every day. This will help you to know if fluid is building up in your body. Weigh yourself every morning after you pee (urinate) and before you eat breakfast. Wear the same amount of clothing each time. Write down your daily weight. Give your record to your doctor. Check and write down your blood pressure as told by your doctor. Check your pulse as told by your doctor. Dealing with very hot and very cold weather If it is very hot: Avoid activities that take a lot of energy. Use air conditioning or fans, or find a cooler place. Avoid caffeine and alcohol. Wear clothing that is loose-fitting, lightweight, and light-colored. If it is very cold: Avoid activities that take a lot of energy. Layer your clothes. Wear mittens or gloves, a hat, and a face covering when you go outside. Avoid alcohol. Follow these instructions at home: Stay up to date with shots (vaccines). Get pneumococcal and flu  (influenza) shots. Keep all follow-up visits. Contact a doctor if: You gain 2-3 lb (1-1.4 kg) in 24 hours or 5 lb (2.3 kg) in a week. You have increasing shortness of breath. You cannot do your normal activities. You get tired easily. You cough a lot. You do not feel like eating or feel like you may vomit (nauseous). You have swelling in your hands, feet, ankles, or belly (abdomen). You cannot sleep well because it is hard to breathe. You feel like your heart is beating fast (palpitations). You get dizzy when you stand up. You feel depressed or sad. Get help right away if: You have trouble breathing. You or someone else notices a change in your behavior, such as having trouble staying awake. You have chest pain or discomfort. You pass out (faint). These symptoms may be an emergency. Get help right away. Call your local emergency services (911 in the [place] Do not wait to see if the symptoms will go away. Do not drive yourself to the hospital. Summary Heart failure is a serious condition. To care for yourself, you may have to change your diet, take medicines, and make other lifestyle changes. Take your medicines every day. Do not stop  taking them unless your doctor tells you to do so. Limit salt and eat heart-healthy foods. Ask your doctor if you can drink alcohol. You may have to stop alcohol use if you have very bad heart failure. Contact your doctor if you gain weight quickly or feel that your heart is beating too fast. Get help right away if you pass out or have chest pain or trouble breathing. This information is not intended to replace advice given to you by your health care provider. Make sure you discuss any questions you have with your health care provider. Document Revised: 02/29/2020 Document Reviewed: 02/29/2020 Elsevier Patient Education  [place] next appointment is by telephone on 10/25/21 at 2:15 PM  Please call the care guide team at [phone] if you  need to cancel or reschedule your appointment.   If you are experiencing a Mental Health or [place] or need someone to talk to, please call the Suicide and Crisis Lifeline: 988 call the [place] National Suicide Prevention Lifeline: 862-263-5245 or TTY: (302)443-7251 TTY (204) 737-2034) to talk to a trained counselor call 1-800-273-TALK (toll free, 24 hour hotline) go to [facility] [address], [place] [phone]) call 911   The patient verbalized understanding of instructions, educational materials, and care plan provided today and agreed to receive a mailed copy of patient instructions, educational materials, and care plan.   Peter Garter RN, Jackquline Denmark, CDE Care Management Coordinator [facility] [phone]

## 2021-10-05 ENCOUNTER — Telehealth: Payer: Self-pay | Admitting: Adult Health

## 2021-10-05 NOTE — Telephone Encounter (Signed)
Sharyn Lull with [facility] completed home health occupational therapy evaluation and he is modified independent with living and daily activities.  Sharyn Lull could be contacted at [phone].  Please advise.

## 2021-10-05 NOTE — Telephone Encounter (Signed)
Thought I routed this

## 2021-10-05 NOTE — Telephone Encounter (Signed)
fyi

## 2021-10-06 DIAGNOSIS — E876 Hypokalemia: Secondary | ICD-10-CM | POA: Diagnosis not present

## 2021-10-06 DIAGNOSIS — I5032 Chronic diastolic (congestive) heart failure: Secondary | ICD-10-CM | POA: Diagnosis not present

## 2021-10-06 DIAGNOSIS — N185 Chronic kidney disease, stage 5: Secondary | ICD-10-CM | POA: Diagnosis not present

## 2021-10-06 DIAGNOSIS — E1122 Type 2 diabetes mellitus with diabetic chronic kidney disease: Secondary | ICD-10-CM | POA: Diagnosis not present

## 2021-10-06 DIAGNOSIS — N2581 Secondary hyperparathyroidism of renal origin: Secondary | ICD-10-CM | POA: Diagnosis not present

## 2021-10-06 DIAGNOSIS — I120 Hypertensive chronic kidney disease with stage 5 chronic kidney disease or end stage renal disease: Secondary | ICD-10-CM | POA: Diagnosis not present

## 2021-10-06 DIAGNOSIS — D631 Anemia in chronic kidney disease: Secondary | ICD-10-CM | POA: Diagnosis not present

## 2021-10-06 DIAGNOSIS — R918 Other nonspecific abnormal finding of lung field: Secondary | ICD-10-CM | POA: Diagnosis not present

## 2021-10-06 DIAGNOSIS — R809 Proteinuria, unspecified: Secondary | ICD-10-CM | POA: Diagnosis not present

## 2021-10-09 ENCOUNTER — Other Ambulatory Visit: Payer: Self-pay | Admitting: Adult Health

## 2021-10-09 DIAGNOSIS — K5900 Constipation, unspecified: Secondary | ICD-10-CM

## 2021-10-12 ENCOUNTER — Encounter: Payer: Self-pay | Admitting: Adult Health

## 2021-10-12 ENCOUNTER — Ambulatory Visit: Payer: Medicare PPO | Admitting: Adult Health

## 2021-10-12 VITALS — BP 160/58 | HR 73 | Temp 97.9°F | Ht 67.0 in | Wt 174.0 lb

## 2021-10-12 DIAGNOSIS — I10 Essential (primary) hypertension: Secondary | ICD-10-CM

## 2021-10-12 DIAGNOSIS — D500 Iron deficiency anemia secondary to blood loss (chronic): Secondary | ICD-10-CM | POA: Diagnosis not present

## 2021-10-12 DIAGNOSIS — N184 Chronic kidney disease, stage 4 (severe): Secondary | ICD-10-CM

## 2021-10-12 DIAGNOSIS — E1122 Type 2 diabetes mellitus with diabetic chronic kidney disease: Secondary | ICD-10-CM

## 2021-10-12 DIAGNOSIS — E876 Hypokalemia: Secondary | ICD-10-CM

## 2021-10-12 DIAGNOSIS — I5033 Acute on chronic diastolic (congestive) heart failure: Secondary | ICD-10-CM

## 2021-10-12 MED ORDER — HYDROCORTISONE (PERIANAL) 2.5 % EX CREA: 1.0000 "application " | TOPICAL_CREAM | Freq: Two times a day (BID) | CUTANEOUS | 3 refills | Status: DC

## 2021-10-12 NOTE — Patient Instructions (Signed)
It was great seeing you today   Please let me know if you need anything

## 2021-10-12 NOTE — Progress Notes (Signed)
Subjective:    Patient ID: Austin [name], male    DOB: Oct 10, 1934, 87 y.o.   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  He presents to the office today for TCM visit   Admit Date 09/26/2021 Discharge Date 09/30/2021  He presented to the emergency room with worsening dyspnea and edema.  Positive for cocaine PMD.  Physical exam his blood pressure was 222/83, heart rate 93, temp 98.9, and oxygen saturation 91%.  He had no rhonchi or wheezing but increased work of breathing  Labs showed Na 144, K 3,7 CL 113, bicarbonate 18, glucose 181  BUN 66 and cr 4,3  BNP 3,670 Wbc 10,7, hgb 9,9, hct 31,6 and plt 280  SARS covid 19 negative  His  chest x-ray with cardiomegaly with hilar vascular congestion bilaterally.  EKG showed first-degree AV block, sinus rhythm with poor R wave progression no significant ST segment or T wave changes.  Was placed on BiPAP and admitted to the progressive care unit.  He was started on aggressive diuresis with IV furosemide  With medical therapy his volume status improved and he was transitioned to nasal cannula supplemental O2 and then to room air with good toleration.  Hospital Course  Acute respiratory failure with hypoxia -Initially required noninvasive mechanical ventilation, with aggressive medical therapy his volume status improved and he was transitioned to nasal cannula and then to room air.  At the  time of discharge his oxygenation was 91 to 94% on room air  Acute kidney injury superimposed on CKD -CKD stage V -Has advanced renal disease, tolerated diuresis with IV furosemide well.  Urine output over 24 hours had been 2650 mL.  At time of discharge his renal function has a serum creatinine of 4.51 with a K at 3.0 and a bicarbonate at 24.  Furosemide was increased to 40 mg twice daily and increase potassium to 20 mEq twice daily  Acute on chronic diastolic CHF -Heart failure decompensation due to renal failure.  At the time of his discharge his volume status had improved.  Further work-up with echocardiogram showed LVEF of 40 to 45% with mobile hypokinesis and mild LVH.  Preserved RV systolic function.  Small pericardial effusion.  Moderate tricuspid regurg regurgitation.  Moderate aortic valve regurgitation. -He was continued on blood pressure control with clonidine, amlodipine, BiDil, and metoprolol.  Continue diuresis with torsemide  Type 2 diabetes mellitus with hyperlipidemia Glucose remained well controlled during hospitalization.  His home hypoglycemic agents were held during hospitalization in the setting of worsening renal function and high risk for hypoglycemia.  Iron deficiency anemia due to chronic blood loss -Continue iron supplementation   Essential hypertension -Continued home medication  UTI -Present on admission.  Completed antibiotic therapy  Today he reports that he is feeling better overall. Is kind of upset that he has to start peritoneal dialysis. He has an appointment to meet with Nephrology and surgery in the next two weeks. Does report some mild DOE but overall has no complaints.   Since stopping glipizide his blood sugars have been in the 115 -  130 range.     Review of Systems  Constitutional: Negative.   HENT: Negative.    Eyes: Negative.   Respiratory:  Positive for shortness of breath.   Cardiovascular: Negative.   Gastrointestinal: Negative.    Endocrine: Negative.   Genitourinary:  Positive for frequency and urgency.  Musculoskeletal: Negative.   Skin: Negative.   Allergic/Immunologic: Negative.   Neurological: Negative.   Hematological: Negative.   Psychiatric/Behavioral: Negative.    All other systems reviewed and are negative.  Past Medical History:  Diagnosis Date   Acute diastolic CHF (congestive heart failure) ([facility]) 09/17/2020   Acute exacerbation of CHF (congestive heart failure) ([place]) 08/04/2019   ANEMIA DUE TO CHRONIC BLOOD LOSS 03/13/2007   CAROTID ARTERY STENOSIS 05/10/2010   CHF (congestive heart failure) ([place])    DIABETES MELLITUS, TYPE II 09/19/2007   DISEASE, CEREBROVASCULAR NEC 03/05/2007   GERD 03/13/2007   HYPERLIPIDEMIA 03/05/2007   HYPERTENSION 03/05/2007   HYPOKALEMIA 11/09/2009   KNEE PAIN, RIGHT 11/09/2009   PROSTATE CANCER, HX OF 03/05/2007   RENAL DISEASE, CHRONIC 02/03/2009    Social History   Socioeconomic History   Marital status: Married    Spouse name: Not on file   Number of children: Not on file   Years of education: Not on file   Highest education level: Not on file  Occupational History   Not on file  Tobacco Use   Smoking status: Former    Packs/day: 1.00    Years: 10.00    Pack years: 10.00    Types: Cigarettes    Quit date: 08/22/1974    Years since quitting: 47.1   Smokeless tobacco: Never  Vaping Use   Vaping Use: Never used  Substance and Sexual Activity   Alcohol use: No    Alcohol/week: 0.0 standard drinks   Drug use: No   Sexual activity: Not Currently  Other Topics Concern   Not on file  Social History Narrative   Retired - Environmental consultant    Married 45 years       He enjoys traveling    Investment banker, operational of Radio broadcast assistant Strain: Low Risk    Difficulty of Paying Living Expenses: Not hard at all  Food Insecurity: No Food Insecurity   Worried About Charity fundraiser in the Last Year: Never true   Arboriculturist in the Last Year: Never true   Transportation Needs: No Transportation Needs   Lack of Transportation (Medical): No   Lack of Transportation (Non-Medical): No  Physical Activity: Inactive   Days of Exercise per Week: 0 days   Minutes of Exercise per Session: 0 min  Stress: No Stress Concern Present   Feeling of Stress : Not at all  Social Connections: Moderately Integrated   Frequency of Communication with Friends and Family: Twice a week   Frequency of Social Gatherings with Friends and Family: Twice a week   Attends Religious Services: More than 4 times per year   Active Member of [employer] or Organizations: No   Attends Archivist Meetings: Never   Marital Status: Married  Human resources officer Violence: Not on file    Past Surgical History:  Procedure Laterality Date   CAROTID ARTERY ANGIOPLASTY Right Oct. 10, 2001   ESOPHAGOGASTRODUODENOSCOPY (EGD) WITH PROPOFOL N/A 11/04/2016   Procedure: ESOPHAGOGASTRODUODENOSCOPY (EGD) WITH PROPOFOL;  Surgeon: Otis Brace, MD;  Location: [facility];  Service: Gastroenterology;  Laterality: N/A;   LAPAROSCOPIC APPENDECTOMY  02/20/2012   Procedure:  APPENDECTOMY LAPAROSCOPIC;  Surgeon: Stark Klein, MD;  Location: [facility];  Service: General;  Laterality: N/A;   PROSTATE SURGERY     prostatectomy    Family History  Problem Relation Age of Onset   Hypertension Mother    Cancer Father        Mesothelioma    Stomach cancer Brother    Cancer Brother    Cancer - Cervical Brother    Cancer Brother    Diabetes Brother    Esophageal cancer Neg Hx    Colon cancer Neg Hx    Pancreatic cancer Neg Hx     Allergies  Allergen Reactions   Aspirin Other (See Comments)    High doses causes stomach ulcer and bleeding    Current Outpatient Medications on File Prior to Visit  Medication Sig Dispense Refill   ACCU-CHEK GUIDE test strip USED TO CHECK BLOOD GLUCOSE TWICE A DAY OR AS NEEDED (Patient taking differently: in the morning and at bedtime.) 100 strip 1   Accu-Chek  Softclix Lancets lancets USED TO CHECK BLOOD GLUCOSE TWICE A DAY OR AS NEEDED (Patient taking differently: in the morning and at bedtime.) 100 each 6   acetaminophen (TYLENOL) 325 MG tablet Take 325 mg by mouth 4 (four) times daily.     albuterol (VENTOLIN HFA) 108 (90 Base) MCG/ACT inhaler TAKE 2 PUFFS BY MOUTH EVERY 6 HOURS AS NEEDED FOR WHEEZE OR SHORTNESS OF BREATH (Patient taking differently: Inhale 2 puffs into the lungs every 6 (six) hours as needed for shortness of breath or wheezing.) 8.5 each 5   amLODipine (NORVASC) 10 MG tablet TAKE 1 TABLET BY MOUTH EVERY DAY (Patient taking differently: Take 10 mg by mouth daily.) 90 tablet 1   atorvastatin (LIPITOR) 20 MG tablet TAKE 1 TABLET BY MOUTH EVERY DAY (Patient taking differently: Take 20 mg by mouth daily.) 90 tablet 3   bisacodyl (DULCOLAX) 10 MG suppository Place 1 suppository (10 mg total) rectally daily as needed for mild constipation. 12 suppository 0   Blood Glucose Monitoring Suppl (ACCU-CHEK AVIVA PLUS) w/Device KIT Used to check blood glucose 2 times a day or PRN (Patient taking differently: in the morning and at bedtime.) 1 kit 0   cholecalciferol (VITAMIN D3) 25 MCG (1000 UNIT) tablet Take 1,000 Units by mouth daily.     cloNIDine (CATAPRES) 0.3 MG tablet Take 1 tablet (0.3 mg total) by mouth 3 (three) times daily. 60 tablet 0   clopidogrel (PLAVIX) 75 MG tablet TAKE 1 TABLET BY MOUTH EVERY DAY (Patient taking differently: Take 75 mg by mouth daily.) 90 tablet 3   doxazosin (CARDURA) 2 MG tablet Take 2 mg by mouth at bedtime.   3   esomeprazole (NEXIUM) 40 MG capsule TAKE 1 CAPSULE BY MOUTH EVERY DAY (Patient taking differently: 40 mg daily.) 90 capsule 3   feeding supplement (ENSURE ENLIVE / ENSURE PLUS) LIQD Take 237 mLs by mouth 2 (two) times daily between meals. 14220 mL 0   hydrocortisone (ANUSOL-HC) 2.5 % rectal cream PLACE 1 APPLICATION RECTALLY 2 TIMES A DAY. (Patient taking differently: Place 1 application rectally 2 (two)  times daily.) 30 g 0   isosorbide-hydrALAZINE (BIDIL) 20-37.5 MG tablet Take 1 tablet by mouth 3 (three) times daily.     lactulose (CHRONULAC) 10 GM/15ML solution TAKE 15 ML BY MOUTH 2 TIMES DAILY AS NEEDED FOR MILD CONSTIPATION. 473 mL 1   methylcellulose (CITRUCEL) oral powder Take 1 packet by mouth See admin instructions. Every 2-3 days,alternating with lactulose  metoprolol succinate (TOPROL-XL) 100 MG 24 hr tablet Take 1 tablet (100 mg total) by mouth daily. Take with or immediately following a meal. 30 tablet 0   multivitamin (RENA-VIT) TABS tablet Take 1 tablet by mouth at bedtime. 30 tablet 0   potassium chloride SA (KLOR-CON M) 20 MEQ tablet Take 1 tablet (20 mEq total) by mouth 2 (two) times daily. 60 tablet 0   Propylene Glycol (SYSTANE BALANCE OP) Place 1 drop into both eyes at bedtime.     torsemide (DEMADEX) 20 MG tablet Take 2 tablets (40 mg total) by mouth 2 (two) times daily. 120 tablet 0   triamcinolone cream (KENALOG) 0.1 % Apply 1 application topically daily as needed (dry/irritated skin).     Current Facility-Administered Medications on File Prior to Visit  Medication Dose Route Frequency Provider Last Rate Last Admin   ammonium lactate (LAC-HYDRIN) 12 % lotion   Topical PRN Felipa Furnace, DPM        There were no vitals taken for this visit.      Objective:   Physical Exam Vitals and nursing note reviewed.  Constitutional:      General: He is not in acute distress.    Appearance: Normal appearance. He is well-developed and normal weight.  HENT:     Head: Normocephalic and atraumatic.     Right Ear: Tympanic membrane, ear canal and external ear normal. There is no impacted cerumen.     Left Ear: Tympanic membrane, ear canal and external ear normal. There is no impacted cerumen.     Nose: Nose normal. No congestion or rhinorrhea.     Mouth/Throat:     Mouth: Mucous membranes are moist.     Pharynx: Oropharynx is clear. No oropharyngeal exudate or posterior  oropharyngeal erythema.  Eyes:     General:        Right eye: No discharge.        Left eye: No discharge.     Extraocular Movements: Extraocular movements intact.     Conjunctiva/sclera: Conjunctivae normal.     Pupils: Pupils are equal, round, and reactive to light.  Neck:     Vascular: No carotid bruit.     Trachea: No tracheal deviation.  Cardiovascular:     Rate and Rhythm: Normal rate and regular rhythm.     Pulses: Normal pulses.     Heart sounds: Normal heart sounds. No murmur heard.   No friction rub. No gallop.  Pulmonary:     Effort: Pulmonary effort is normal. No respiratory distress.     Breath sounds: Normal breath sounds. No stridor. No wheezing, rhonchi or rales.  Chest:     Chest wall: No tenderness.  Abdominal:     General: Bowel sounds are normal. There is no distension.     Palpations: Abdomen is soft. There is no mass.     Tenderness: There is no abdominal tenderness. There is no right CVA tenderness, left CVA tenderness, guarding or rebound.     Hernia: No hernia is present.  Musculoskeletal:        General: No swelling, tenderness, deformity or signs of injury. Normal range of motion.     Right lower leg: No edema.     Left lower leg: No edema.  Lymphadenopathy:     Cervical: No cervical adenopathy.  Skin:    General: Skin is warm and dry.     Capillary Refill: Capillary refill takes less than 2 seconds.     Coloration: Skin  is not jaundiced or pale.     Findings: No bruising, erythema, lesion or rash.  Neurological:     General: No focal deficit present.     Mental Status: He is alert and oriented to person, place, and time.     Cranial Nerves: No cranial nerve deficit.     Sensory: No sensory deficit.     Motor: No weakness.     Coordination: Coordination normal.     Gait: Gait normal.     Deep Tendon Reflexes: Reflexes normal.  Psychiatric:        Mood and Affect: Mood normal.        Behavior: Behavior normal.        Thought Content: Thought  content normal.        Judgment: Judgment normal.      Assessment & Plan:  1. Acute on chronic diastolic congestive heart failure [facility]) - Reviewed admission labs, imaging, and discharge instructions with patient. All questions answered to the best of my ability  - Continue current medications   2. Type 2 diabetes mellitus with stage 4 chronic kidney disease, without long-term current use of insulin ([facility]) - Advised to hold glipizide unless blood sugars are increasing above 140-150 consistently and then will let me know and we will restart   3. CKD (chronic kidney disease), stage IV ([place]) - Follow up with nephrology as directed  4. Essential hypertension - At goal   5. Hypokalemia -  Continue Potassium supplementation   6. Iron deficiency anemia due to chronic blood loss - Continue OTC iron   Dorothyann Peng, NP

## 2021-10-14 LAB — HM DIABETES EYE EXAM

## 2021-10-15 ENCOUNTER — Encounter: Payer: Self-pay | Admitting: Adult Health

## 2021-10-15 DIAGNOSIS — I10 Essential (primary) hypertension: Secondary | ICD-10-CM | POA: Diagnosis not present

## 2021-10-15 DIAGNOSIS — I5030 Unspecified diastolic (congestive) heart failure: Secondary | ICD-10-CM | POA: Diagnosis not present

## 2021-10-15 DIAGNOSIS — I351 Nonrheumatic aortic (valve) insufficiency: Secondary | ICD-10-CM | POA: Diagnosis not present

## 2021-10-15 DIAGNOSIS — E1169 Type 2 diabetes mellitus with other specified complication: Secondary | ICD-10-CM | POA: Diagnosis not present

## 2021-10-15 DIAGNOSIS — I350 Nonrheumatic aortic (valve) stenosis: Secondary | ICD-10-CM | POA: Diagnosis not present

## 2021-10-18 ENCOUNTER — Other Ambulatory Visit: Payer: Self-pay | Admitting: Adult Health

## 2021-10-18 DIAGNOSIS — K5900 Constipation, unspecified: Secondary | ICD-10-CM

## 2021-10-19 DIAGNOSIS — I10 Essential (primary) hypertension: Secondary | ICD-10-CM | POA: Diagnosis not present

## 2021-10-19 DIAGNOSIS — E1122 Type 2 diabetes mellitus with diabetic chronic kidney disease: Secondary | ICD-10-CM | POA: Diagnosis not present

## 2021-10-19 DIAGNOSIS — N184 Chronic kidney disease, stage 4 (severe): Secondary | ICD-10-CM | POA: Diagnosis not present

## 2021-10-19 DIAGNOSIS — I5033 Acute on chronic diastolic (congestive) heart failure: Secondary | ICD-10-CM | POA: Diagnosis not present

## 2021-10-23 ENCOUNTER — Other Ambulatory Visit: Payer: Self-pay | Admitting: Adult Health

## 2021-10-25 ENCOUNTER — Ambulatory Visit (INDEPENDENT_AMBULATORY_CARE_PROVIDER_SITE_OTHER): Payer: Medicare PPO

## 2021-10-25 DIAGNOSIS — N184 Chronic kidney disease, stage 4 (severe): Secondary | ICD-10-CM

## 2021-10-25 DIAGNOSIS — I10 Essential (primary) hypertension: Secondary | ICD-10-CM

## 2021-10-25 DIAGNOSIS — I5033 Acute on chronic diastolic (congestive) heart failure: Secondary | ICD-10-CM

## 2021-10-25 DIAGNOSIS — E785 Hyperlipidemia, unspecified: Secondary | ICD-10-CM

## 2021-10-25 DIAGNOSIS — E1169 Type 2 diabetes mellitus with other specified complication: Secondary | ICD-10-CM

## 2021-10-25 NOTE — Chronic Care Management (AMB) (Signed)
Chronic Care Management   CCM RN Visit Note  10/25/2021 Name: Austin [name] MRN: [id number] DOB: [birth year]  Subjective: Austin [name] is a 87 y.o. year old male who is a primary care patient of Austin Peng, NP. The care management team was consulted for assistance with disease management and care coordination needs.    Engaged with patient by telephone for follow up visit in response to provider referral for case management and/or care coordination services.   Consent to Services:  The patient was given information about Chronic Care Management services, agreed to services, and gave verbal consent prior to initiation of services.  Please see initial visit note for detailed documentation.   Patient agreed to services and verbal consent obtained.   Assessment: Review of patient past medical history, allergies, medications, health status, including review of consultants reports, laboratory and other test data, was performed as part of comprehensive evaluation and provision of chronic care management services.   SDOH (Social Determinants of Health) assessments and interventions performed:    CCM Care Plan  Allergies  Allergen Reactions   Aspirin Other (See Comments)    High doses causes stomach ulcer and bleeding    Outpatient Encounter Medications as of 10/25/2021  Medication Sig   ACCU-CHEK GUIDE test strip USED TO CHECK BLOOD GLUCOSE TWICE A DAY OR AS NEEDED (Patient taking differently: in the morning and at bedtime.)   Accu-Chek Softclix Lancets lancets USED TO CHECK BLOOD GLUCOSE TWICE A DAY OR AS NEEDED (Patient taking differently: in the morning and at bedtime.)   acetaminophen (TYLENOL) 325 MG tablet Take 325 mg by mouth 4 (four) times daily.   albuterol (VENTOLIN HFA) 108 (90 Base) MCG/ACT inhaler TAKE 2 PUFFS BY MOUTH EVERY 6 HOURS AS NEEDED FOR WHEEZE OR SHORTNESS OF BREATH (Patient taking differently: Inhale 2 puffs into the lungs every 6 (six) hours as needed for shortness of  breath or wheezing.)   amLODipine (NORVASC) 10 MG tablet TAKE 1 TABLET BY MOUTH EVERY DAY (Patient taking differently: Take 10 mg by mouth daily.)   atorvastatin (LIPITOR) 20 MG tablet TAKE 1 TABLET BY MOUTH EVERY DAY (Patient taking differently: Take 20 mg by mouth daily.)   bisacodyl (DULCOLAX) 10 MG suppository Place 1 suppository (10 mg total) rectally daily as needed for mild constipation.   Blood Glucose Monitoring Suppl (ACCU-CHEK AVIVA PLUS) w/Device KIT Used to check blood glucose 2 times a day or PRN (Patient taking differently: in the morning and at bedtime.)   cholecalciferol (VITAMIN D3) 25 MCG (1000 UNIT) tablet Take 1,000 Units by mouth daily.   cloNIDine (CATAPRES) 0.3 MG tablet Take 1 tablet (0.3 mg total) by mouth 3 (three) times daily.   clopidogrel (PLAVIX) 75 MG tablet TAKE 1 TABLET BY MOUTH EVERY DAY (Patient taking differently: Take 75 mg by mouth daily.)   doxazosin (CARDURA) 2 MG tablet Take 2 mg by mouth at bedtime.    esomeprazole (NEXIUM) 40 MG capsule TAKE 1 CAPSULE BY MOUTH EVERY DAY (Patient taking differently: 40 mg daily.)   feeding supplement (ENSURE ENLIVE / ENSURE PLUS) LIQD Take 237 mLs by mouth 2 (two) times daily between meals.   hydrocortisone (ANUSOL-HC) 2.5 % rectal cream Place 1 application rectally 2 (two) times daily.   isosorbide-hydrALAZINE (BIDIL) 20-37.5 MG tablet Take 1 tablet by mouth 3 (three) times daily.   KLOR-CON M10 10 MEQ tablet Take by mouth.   lactulose (CHRONULAC) 10 GM/15ML solution TAKE 15 ML BY MOUTH 2 TIMES DAILY AS NEEDED FOR MILD  CONSTIPATION.   methylcellulose (CITRUCEL) oral powder Take 1 packet by mouth See admin instructions. Every 2-3 days,alternating with lactulose   metoprolol succinate (TOPROL-XL) 100 MG 24 hr tablet Take 1 tablet (100 mg total) by mouth daily. Take with or immediately following a meal.   multivitamin (RENA-VIT) TABS tablet Take 1 tablet by mouth at bedtime.   potassium chloride SA (KLOR-CON M) 20 MEQ tablet  Take 1 tablet (20 mEq total) by mouth 2 (two) times daily.   Propylene Glycol (SYSTANE BALANCE OP) Place 1 drop into both eyes at bedtime.   torsemide (DEMADEX) 20 MG tablet Take 2 tablets (40 mg total) by mouth 2 (two) times daily.   triamcinolone cream (KENALOG) 0.1 % Apply 1 application topically daily as needed (dry/irritated skin).   Facility-Administered Encounter Medications as of 10/25/2021  Medication   ammonium lactate (LAC-HYDRIN) 12 % lotion    Patient Active Problem List   Diagnosis Date Noted   UTI (urinary tract infection) 09/27/2021   CHF exacerbation ([place]) 09/26/2021   Acute respiratory failure with hypoxia ([place]) 09/26/2021   Acute on chronic diastolic CHF (congestive heart failure) ([place]) 07/09/2021   Dyspnea 06/01/2021   Upper airway cough syndrome 04/06/2021   Porokeratosis 03/26/2021   Pulmonary nodule 1 cm or greater in diameter 10/22/2020   Acute on chronic renal failure ([place]) 10/09/2020   Mass of right lung 09/18/2020   Acute kidney injury superimposed on CKD ([place]) 08/04/2019   Elevated troponin 08/04/2019   Hypokalemia 08/04/2019   Hypertensive urgency 03/28/2019   Elevated serum immunoglobulin free light chains 02/20/2019   Gastrointestinal hemorrhage with melena    Melena 11/03/2016   Carotid artery stenosis 05/10/2010   Type 2 diabetes mellitus with hyperlipidemia ([place]) 09/19/2007   Iron deficiency anemia due to chronic blood loss 03/13/2007   GERD 03/13/2007   Essential hypertension 03/05/2007   DISEASE, CEREBROVASCULAR NEC 03/05/2007   PROSTATE CANCER, HX OF 03/05/2007    Conditions to be addressed/monitored:CHF, HTN, HLD, DMII, and CKD Stage 4  Care Plan : RN Care Manager Plan of Care  Updates made by Dimitri Ped, RN since 10/25/2021 12:00 AM     Problem: Chronic Disease Management and Care Coordination Needs (CHF, CKD4, DM,HTN and HLD)   Priority: High     Long-Range Goal: Establish Plan of Care for Chronic Disease Management Needs  (CHF, CKD4, DM,HTN and HLD)   Start Date: 09/02/2021  Expected End Date: 08/30/2022  Priority: High  Note:   Current Barriers:  Knowledge Deficits related to plan of care for management of CHF, HTN, HLD, DMII, and CKD Stage 4  Chronic Disease Management support and education needs related to CHF, HTN, HLD, DMII, and CKD Stage 4  States he has been feeling good.  States he has not been using his walker.  States he is still getting PT twice a week   States he is trying to weigh every day and he weighted 175.3 this morning.   States he is to see nephrology on 11/10/21 and he still may have to start dialysis. States he saw  Dr. Terrence Dupont last week.  States his B/P was 140/60 when PT checked it for him. States it usually is in that range at home and goes up when he goes to the doctor.  States his CBG have ranged 105-130 with no low readings. States he is trying to follow healthy diet and he can no longer have diary products due to his kidneys RNCM Clinical Goal(s):  Patient will  verbalize understanding of plan for management of CHF, HTN, HLD, DMII, and CKD Stage 4 as evidenced by voiced adherence to plan of care verbalize basic understanding of  CHF, HTN, HLD, DMII, and CKD Stage 4 disease process and self health management plan as evidenced by voiced understanding and teach back take all medications exactly as prescribed and will call provider for medication related questions as evidenced by dispense report and pt verbalization attend all scheduled medical appointments: Pulmonology/sleep 11/02/21,Nephrology 11/10/21, cardiology 12/01/21 as evidenced by medical records demonstrate Improved adherence to prescribed treatment plan for CHF, HTN, HLD, DMII, and CKD Stage 4 as evidenced by readings within limits, voiced adherence to plan of care continue to work with RN Care Manager to address care management and care coordination needs related to  CHF, HTN, HLD, DMII, and CKD Stage 4 as evidenced by adherence to CM Team  Scheduled appointments not experience hospital admission as evidenced by review of EMR. Hospital Admissions in last 6 months = 2 demonstrate a decrease in CHF, HTN, HLD, DMII, and CKD Stage 4 exacerbations as evidenced by pt reports and no hospitalizations  through collaboration with RN Care manager, provider, and care team.   Interventions: 1:1 collaboration with primary care provider regarding development and update of comprehensive plan of care as evidenced by provider attestation and co-signature Inter-disciplinary care team collaboration (see longitudinal plan of care) Evaluation of current treatment plan related to  self management and patient's adherence to plan as established by provider   Heart Failure Interventions:  (Status:  Goal on track:  NO.) Long Term Goal Basic overview and discussion of pathophysiology of Heart Failure reviewed Provided education on low sodium diet Reviewed Heart Failure Action Plan in depth and provided written copy Assessed need for readable accurate scales in home Discussed importance of daily weight and advised patient to weigh and record daily Reviewed role of diuretics in prevention of fluid overload and management of heart failure; Discussed the importance of keeping all appointments with provider Reinforced importance of weighting daily and when to call provider, Reinforced to watch for increased shortness of breath ,swelling in legs and abdomen to notify provider   Chronic Kidney Disease Interventions:  (Status:  Goal on track:  Yes.) Long Term Goal Assessed the Patient understanding of chronic kidney disease    Evaluation of current treatment plan related to chronic kidney disease self management and patient's adherence to plan as established by provider      Provided education to patient re: stroke prevention, s/s of heart attack and stroke    Reviewed prescribed diet low sodium low CHO renal diet Advised patient, providing education and  rationale, to monitor blood pressure daily and record, calling PCP for findings outside established parameters    Reviewed to keep appointment with nephrologist on 11/10/21 Last practice recorded BP readings:  BP Readings from Last 3 Encounters:  10/12/21 (!) 160/58  09/30/21 (!) 158/61  07/21/21 140/80  Most recent eGFR/CrCl: No results found for: EGFR  No components found for: CRCL    Diabetes Interventions:  (Status:  Goal on track:  Yes.) Long Term Goal Assessed patient's understanding of A1c goal: <7% Provided education to patient about basic DM disease process Reviewed medications with patient and discussed importance of medication adherence Discussed plans with patient for ongoing care management follow up and provided patient with direct contact information for care management team Provided patient with written educational materials related to hypo and hyperglycemia and importance of correct treatment Reviewed scheduled/upcoming provider  appointments including: Pulmonology/sleep 11/02/21,Nephrology 11/10/21, cardiology 12/01/21 Advised patient, providing education and rationale, to check cbg daily and record, calling provider for findings outside established parameters Reviewed to notify provider if his blood sugars start to increase since he is off of glipizide Lab Results  Component Value Date   HGBA1C 6.1 (H) 09/27/2021   Hyperlipidemia Interventions:  (Status:  Condition stable.  Not addressed this visit.) Long Term Goal Medication review performed; medication list updated in electronic medical record.  Provider established cholesterol goals reviewed Counseled on importance of regular laboratory monitoring as prescribed Reviewed role and benefits of statin for ASCVD risk reduction Reviewed importance of limiting foods high in cholesterol  Hypertension Interventions:  (Status:  Goal on track:  Yes.) Long Term Goal Last practice recorded BP readings:  BP Readings from Last 3  Encounters:  10/12/21 (!) 160/58  09/30/21 (!) 158/61  07/21/21 140/80  Most recent eGFR/CrCl: No results found for: EGFR  No components found for: CRCL  Evaluation of current treatment plan related to hypertension self management and patient's adherence to plan as established by provider Provided education to patient re: stroke prevention, s/s of heart attack and stroke Discussed plans with patient for ongoing care management follow up and provided patient with direct contact information for care management team Advised patient, providing education and rationale, to monitor blood pressure daily and record, calling PCP for findings outside established parameters Discussed complications of poorly controlled blood pressure such as heart disease, stroke, circulatory complications, vision complications, kidney impairment, sexual dysfunction Reviewed to try to check his B/P at home regularly  Patient Goals/Self-Care Activities: Take all medications as prescribed Attend all scheduled provider appointments Call pharmacy for medication refills 3-7 days in advance of running out of medications Call provider office for new concerns or questions  call office if I gain more than 2 pounds in one day or 5 pounds in one week keep legs up while sitting track weight in diary watch for swelling in feet, ankles and legs every day weigh myself daily follow rescue plan if symptoms flare-up eat more whole grains, fruits and vegetables, lean meats and healthy fats track symptoms and what helps feel better or worse keep appointment with eye doctor check blood sugar at prescribed times: once daily and when you have symptoms of low or high blood sugar check feet daily for cuts, sores or redness take the blood sugar log to all doctor visits fill half of plate with vegetables manage portion size switch to sugar-free drinks keep feet up while sitting wash and dry feet carefully every day wear comfortable,  cotton socks wear comfortable, well-fitting shoes check blood pressure daily choose a place to take my blood pressure (home, clinic or office, retail store) take blood pressure log to all doctor appointments call doctor for signs and symptoms of high blood pressure eat more whole grains, fruits and vegetables, lean meats and healthy fats limit salt intake to 2051m/day call for medicine refill 2 or 3 days before it runs out take all medications exactly as prescribed call doctor with any symptoms you believe are related to your medicine  Follow Up Plan:  Telephone follow up appointment with care management team member scheduled for:  12/07/21 The patient has been provided with contact information for the care management team and has been advised to call with any health related questions or concerns.       Plan:Telephone follow up appointment with care management team member scheduled for:  12/07/21 The patient has  been provided with contact information for the care management team and has been advised to call with any health related questions or concerns.  Peter Garter RN, Jackquline Denmark, CDE Care Management Coordinator [facility] [phone]

## 2021-10-25 NOTE — Patient Instructions (Signed)
Visit Information ? ?Thank you for taking time to visit with me today. Please don't hesitate to contact me if I can be of assistance to you before our next scheduled telephone appointment. ? ?Following are the goals we discussed today:  ?Take all medications as prescribed ?Attend all scheduled provider appointments ?Call pharmacy for medication refills 3-7 days in advance of running out of medications ?Call provider office for new concerns or questions  ?call office if I gain more than 2 pounds in one day or 5 pounds in one week ?keep legs up while sitting ?track weight in diary ?watch for swelling in feet, ankles and legs every day ?weigh myself daily ?follow rescue plan if symptoms flare-up ?eat more whole grains, fruits and vegetables, lean meats and healthy fats ?track symptoms and what helps feel better or worse ?keep appointment with eye doctor ?check blood sugar at prescribed times: once daily and when you have symptoms of low or high blood sugar ?check feet daily for cuts, sores or redness ?take the blood sugar log to all doctor visits ?fill half of plate with vegetables ?manage portion size ?switch to sugar-free drinks ?keep feet up while sitting ?wash and dry feet carefully every day ?wear comfortable, cotton socks ?wear comfortable, well-fitting shoes ?check blood pressure daily ?choose a place to take my blood pressure (home, clinic or office, retail store) ?take blood pressure log to all doctor appointments ?call doctor for signs and symptoms of high blood pressure ?eat more whole grains, fruits and vegetables, lean meats and healthy fats ?limit salt intake to '2000mg'$ /day ?call for medicine refill 2 or 3 days before it runs out ?take all medications exactly as prescribed ?call doctor with any symptoms you believe are related to your medicine ? ?Our next appointment is by telephone on 12/07/21 at 2:15 PM ? ?Please call the care guide team at [phone] if you need to cancel or reschedule your  appointment.  ? ?If you are experiencing a Mental Health or [place] or need someone to talk to, please call the Suicide and Crisis Lifeline: 988 ?call the [place] National Suicide Prevention Lifeline: 858-271-0993 or TTY: (204) 748-6825 TTY (647)475-6186) to talk to a trained counselor ?call 1-800-273-TALK (toll free, 24 hour hotline) ?go to [facility] [address], [place] [id number]) ?call 911  ? ?The patient verbalized understanding of instructions, educational materials, and care plan provided today and agreed to receive a mailed copy of patient instructions, educational materials, and care plan.  ? ?Peter Garter RN, BSN,CCM, CDE ?Care Management Coordinator ?[facility] ?[phone]   ?

## 2021-11-02 ENCOUNTER — Institutional Professional Consult (permissible substitution): Payer: Medicare PPO | Admitting: Pulmonary Disease

## 2021-11-04 DIAGNOSIS — N185 Chronic kidney disease, stage 5: Secondary | ICD-10-CM | POA: Diagnosis not present

## 2021-11-10 ENCOUNTER — Telehealth: Payer: Self-pay

## 2021-11-10 ENCOUNTER — Ambulatory Visit: Payer: Medicare PPO | Admitting: Emergency Medicine

## 2021-11-10 ENCOUNTER — Other Ambulatory Visit: Payer: Self-pay

## 2021-11-10 ENCOUNTER — Encounter: Payer: Self-pay | Admitting: Emergency Medicine

## 2021-11-10 DIAGNOSIS — I5032 Chronic diastolic (congestive) heart failure: Secondary | ICD-10-CM | POA: Diagnosis not present

## 2021-11-10 DIAGNOSIS — N2581 Secondary hyperparathyroidism of renal origin: Secondary | ICD-10-CM | POA: Diagnosis not present

## 2021-11-10 DIAGNOSIS — D631 Anemia in chronic kidney disease: Secondary | ICD-10-CM | POA: Diagnosis not present

## 2021-11-10 DIAGNOSIS — R911 Solitary pulmonary nodule: Secondary | ICD-10-CM

## 2021-11-10 DIAGNOSIS — I120 Hypertensive chronic kidney disease with stage 5 chronic kidney disease or end stage renal disease: Secondary | ICD-10-CM | POA: Diagnosis not present

## 2021-11-10 DIAGNOSIS — N185 Chronic kidney disease, stage 5: Secondary | ICD-10-CM | POA: Diagnosis not present

## 2021-11-10 DIAGNOSIS — R918 Other nonspecific abnormal finding of lung field: Secondary | ICD-10-CM | POA: Diagnosis not present

## 2021-11-10 DIAGNOSIS — E1122 Type 2 diabetes mellitus with diabetic chronic kidney disease: Secondary | ICD-10-CM | POA: Diagnosis not present

## 2021-11-10 DIAGNOSIS — E876 Hypokalemia: Secondary | ICD-10-CM | POA: Diagnosis not present

## 2021-11-10 NOTE — Progress Notes (Signed)
? ?  Subjective:  ? ? Patient ID: Austin [name], male    DOB: 06-Oct-1934, 87 y.o.   MRN: [id number] ? ?HPI ? ?ROV 11/10/21 --follow-up visit for 87 year old man with a history of hypertension with diastolic dysfunction, CRI, chronic cough with upper airway irritation syndrome in the setting of GERD and chronic rhinitis.  He also has pulmonary nodular disease that we have followed with serial CT scans of the chest.  Last imaging was a PET scan 06/14/2021 that showed a stable 2.4 x 1.8 cm right upper lobe nodule with minimal hypermetabolic activity, 1.1 cm groundglass left upper lobe nodule.  We decided to manage conservatively with a repeat CT scan to look for interval change, plan for April. ?He returns today reporting that began to develop some R subscapular pain, began about 10 days ago. No cough. No SOB. The pain seems to have resolved.  ? ? ?Review of Systems ?As per HPI ? ?   ?Objective:  ? Physical Exam ?Vitals:  ? 11/10/21 1637  ?BP: (!) 142/78  ?Pulse: 60  ?Temp: 98 ?F (36.7 ?C)  ?TempSrc: Oral  ?SpO2: 100%  ?Weight: 174 lb 6.4 oz (79.1 kg)  ?Height: '5\' 7"'$  (1.702 m)  ? ?Gen: Pleasant, well-nourished, in no distress,  normal affect ? ?ENT: No lesions,  mouth clear,  oropharynx clear, no postnasal drip ? ?Neck: No JVD, no stridor ? ?Lungs: No use of accessory muscles, no crackles or wheezing on normal respiration, no wheeze on forced expiration ? ?Cardiovascular: RRR, heart sounds normal, no murmur or gallops, no peripheral edema ? ?Musculoskeletal: No deformities, no cyanosis or clubbing ? ?Neuro: alert, awake, non focal ? ?Skin: Warm, no lesions or rash ? ?   ?Assessment & Plan:  ?Pulmonary nodule 1 cm or greater in diameter ?Pulmonary nodules on CT chest that we have following conservatively.  Planning for repeat study in April.  He is having some subscapular pain that does not have any radiographical correlate on CT. ? ?Please get your CT scan of the chest on 11/25/2021 as planned. ?[name] will call you after  the scan is done to review together.  Based on the results we will decide whether to consider any further testing to evaluate your pulmonary nodules, including possible biopsy ? ?Baltazar Apo, MD, PhD ?11/26/2021, 7:49 AM ?[facility] ?[phone] or if no answer before 7:00PM call [phone] ?For any issues after 7:00PM please call eLink [phone] ? ?

## 2021-11-10 NOTE — Telephone Encounter (Signed)
Pt came in with PPW stating the clopidogrel and esomeprazole has a contraindication. Per cory pt is okay to take together.  ? ?Pt also came in with billing concerns regarding a hospital visit. Pt notified that this would need to go to the billing department at the hospital.  ? ?Pt left [place] a copy of a letter from [facility] stating that his Hydrocortisone 2.5 cream is not covered by insurance for the full amount prescribed.   ?

## 2021-11-10 NOTE — Patient Instructions (Signed)
Please get your CT scan of the chest on 11/25/2021 as planned. ?[name] will call you after the scan is done to review together.  Based on the results we will decide whether to consider any further testing to evaluate your pulmonary nodules, including possible biopsy. ? ?

## 2021-11-11 ENCOUNTER — Ambulatory Visit: Payer: Medicare PPO | Admitting: Physician Assistant

## 2021-11-11 ENCOUNTER — Ambulatory Visit (HOSPITAL_COMMUNITY)
Admission: RE | Admit: 2021-11-11 | Discharge: 2021-11-11 | Disposition: A | Discharge status: Home / Self Care | Payer: Medicare PPO | Source: Ambulatory Visit | Attending: Physician Assistant | Admitting: Physician Assistant

## 2021-11-11 VITALS — Temp 97.6°F | Ht 67.0 in | Wt 169.7 lb

## 2021-11-11 DIAGNOSIS — I6521 Occlusion and stenosis of right carotid artery: Secondary | ICD-10-CM

## 2021-11-11 NOTE — Progress Notes (Signed)
? ? ? ? ? ?History of Present Illness:  Patient is a 87 y.o. year old male who presents for evaluation of carotid stenosis.  The patient denies symptoms of TIA, amaurosis, or stroke.  He is s/p right carotid intracranial angioplasty in October 2001 by Dr. Estanislado Pandy. ?  ? ? He is medically managed on Plavix and Statin daily.   ?  ? ? ? ? ?Past Medical History:  ?Diagnosis Date  ? Acute diastolic CHF (congestive heart failure) ([place]) 09/17/2020  ? Acute exacerbation of CHF (congestive heart failure) ([place]) 08/04/2019  ? ANEMIA DUE TO CHRONIC BLOOD LOSS 03/13/2007  ? CAROTID ARTERY STENOSIS 05/10/2010  ? CHF (congestive heart failure) ([place])   ? DIABETES MELLITUS, TYPE II 09/19/2007  ? DISEASE, CEREBROVASCULAR NEC 03/05/2007  ? GERD 03/13/2007  ? HYPERLIPIDEMIA 03/05/2007  ? HYPERTENSION 03/05/2007  ? HYPOKALEMIA 11/09/2009  ? KNEE PAIN, RIGHT 11/09/2009  ? PROSTATE CANCER, HX OF 03/05/2007  ? RENAL DISEASE, CHRONIC 02/03/2009  ? ? ?Past Surgical History:  ?Procedure Laterality Date  ? CAROTID ARTERY ANGIOPLASTY Right Oct. 10, 2001  ? ESOPHAGOGASTRODUODENOSCOPY (EGD) WITH PROPOFOL N/A 11/04/2016  ? Procedure: ESOPHAGOGASTRODUODENOSCOPY (EGD) WITH PROPOFOL;  Surgeon: Otis Brace, MD;  Location: [facility];  Service: Gastroenterology;  Laterality: N/A;  ? LAPAROSCOPIC APPENDECTOMY  02/20/2012  ? Procedure: APPENDECTOMY LAPAROSCOPIC;  Surgeon: Stark Klein, MD;  Location: [facility];  Service: General;  Laterality: N/A;  ? PROSTATE SURGERY    ? prostatectomy  ? ? ? ?Social History ?Social History  ? ?Tobacco Use  ? Smoking status: Former  ?  Packs/day: 1.00  ?  Years: 10.00  ?  Pack years: 10.00  ?  Types: Cigarettes  ?  Quit date: 08/22/1974  ?  Years since quitting: 75.2  ? Smokeless tobacco: Never  ?Vaping Use  ? Vaping Use: Never used  ?Substance Use Topics  ? Alcohol use: No  ?  Alcohol/week: 0.0 standard drinks  ? Drug use: No  ? ? ?Family History ?Family History   ?Problem Relation Age of Onset  ? Hypertension Mother   ? Cancer Father   ?     Mesothelioma   ? Stomach cancer Brother   ? Cancer Brother   ? Cancer - Cervical Brother   ? Cancer Brother   ? Diabetes Brother   ? Esophageal cancer Neg Hx   ? Colon cancer Neg Hx   ? Pancreatic cancer Neg Hx   ? ? ?Allergies ? ?Allergies  ?Allergen Reactions  ? Aspirin Other (See Comments)  ?  High doses causes stomach ulcer and bleeding  ? ? ? ?Current Outpatient Medications  ?Medication Sig Dispense Refill  ? ACCU-CHEK GUIDE test strip USED TO CHECK BLOOD GLUCOSE TWICE A DAY OR AS NEEDED 100 strip 1  ? Accu-Chek Softclix Lancets lancets USED TO CHECK BLOOD GLUCOSE TWICE A DAY OR AS NEEDED (Patient taking differently: in the morning and at bedtime.) 100 each 6  ? acetaminophen (TYLENOL) 325 MG tablet Take 325 mg by mouth 4 (four) times daily.    ? albuterol (VENTOLIN HFA) 108 (90 Base) MCG/ACT inhaler TAKE 2 PUFFS BY MOUTH EVERY 6 HOURS AS NEEDED FOR WHEEZE OR SHORTNESS OF BREATH (Patient taking differently: Inhale 2 puffs into the lungs every 6 (six) hours as needed for shortness of breath or wheezing.) 8.5 each 5  ? amLODipine (NORVASC) 10 MG tablet TAKE 1 TABLET BY MOUTH EVERY DAY (Patient taking differently: Take 10 mg by mouth daily.) 90 tablet 1  ?  atorvastatin (LIPITOR) 20 MG tablet TAKE 1 TABLET BY MOUTH EVERY DAY (Patient taking differently: Take 20 mg by mouth daily.) 90 tablet 3  ? Blood Glucose Monitoring Suppl (ACCU-CHEK AVIVA PLUS) w/Device KIT Used to check blood glucose 2 times a day or PRN (Patient taking differently: in the morning and at bedtime.) 1 kit 0  ? cholecalciferol (VITAMIN D3) 25 MCG (1000 UNIT) tablet Take 1,000 Units by mouth daily.    ? cloNIDine (CATAPRES) 0.3 MG tablet Take 1 tablet (0.3 mg total) by mouth 3 (three) times daily. 60 tablet 0  ? clopidogrel (PLAVIX) 75 MG tablet TAKE 1 TABLET BY MOUTH EVERY DAY (Patient taking differently: Take 75 mg by mouth daily.) 90 tablet 3  ? doxazosin (CARDURA)  2 MG tablet Take 2 mg by mouth at bedtime.   3  ? esomeprazole (NEXIUM) 40 MG capsule TAKE 1 CAPSULE BY MOUTH EVERY DAY (Patient taking differently: 40 mg daily.) 90 capsule 3  ? hydrocortisone (ANUSOL-HC) 2.5 % rectal cream Place 1 application rectally 2 (two) times daily. 28 g 3  ? isosorbide-hydrALAZINE (BIDIL) 20-37.5 MG tablet Take 1 tablet by mouth 3 (three) times daily.    ? KLOR-CON M10 10 MEQ tablet Take by mouth.    ? lactulose (CHRONULAC) 10 GM/15ML solution TAKE 15 ML BY MOUTH 2 TIMES DAILY AS NEEDED FOR MILD CONSTIPATION. 473 mL 1  ? methylcellulose (CITRUCEL) oral powder Take 1 packet by mouth See admin instructions. Every 2-3 days,alternating with lactulose    ? metoprolol succinate (TOPROL-XL) 100 MG 24 hr tablet Take 1 tablet (100 mg total) by mouth daily. Take with or immediately following a meal. 30 tablet 0  ? potassium chloride SA (KLOR-CON M) 20 MEQ tablet Take 1 tablet (20 mEq total) by mouth 2 (two) times daily. 60 tablet 0  ? Propylene Glycol (SYSTANE BALANCE OP) Place 1 drop into both eyes at bedtime.    ? torsemide (DEMADEX) 20 MG tablet Take 2 tablets (40 mg total) by mouth 2 (two) times daily. 120 tablet 0  ? triamcinolone cream (KENALOG) 0.1 % Apply 1 application topically daily as needed (dry/irritated skin).    ? bisacodyl (DULCOLAX) 10 MG suppository Place 1 suppository (10 mg total) rectally daily as needed for mild constipation. (Patient not taking: Reported on 11/11/2021) 12 suppository 0  ? ?Current Facility-Administered Medications  ?Medication Dose Route Frequency Provider Last Rate Last Admin  ? ammonium lactate (LAC-HYDRIN) 12 % lotion   Topical PRN Felipa Furnace, DPM      ? ? ?ROS:  ? ?General:  No weight loss, Fever, chills ? ?HEENT: No recent headaches, no nasal bleeding, no visual changes, no sore throat ? ?Neurologic: No dizziness, blackouts, seizures. No recent symptoms of stroke or mini- stroke. No recent episodes of slurred speech, or temporary blindness. ? ?Cardiac:  No recent episodes of chest pain/pressure, no shortness of breath at rest.  No shortness of breath with exertion.  Denies history of atrial fibrillation or irregular heartbeat ? ?Vascular: No history of rest pain in feet.  No history of claudication.  No history of non-healing ulcer, No history of DVT  ? ?Pulmonary: No home oxygen, no productive cough, no hemoptysis,  No asthma or wheezing ? ?Musculoskeletal:  '[ ]'  Arthritis, '[ ]'  Low back pain,  '[ ]'  Joint pain ? ?Hematologic:No history of hypercoagulable state.  No history of easy bleeding.  No history of anemia ? ?Gastrointestinal: No hematochezia or melena,  No gastroesophageal reflux, no trouble swallowing ? ?  Urinary: '[ ]'  chronic Kidney disease, '[ ]'  on HD - '[ ]'  MWF or '[ ]'  TTHS, '[ ]'  Burning with urination, '[ ]'  Frequent urination, '[ ]'  Difficulty urinating;  ? ?Skin: No rashes ? ?Psychological: No history of anxiety,  No history of depression ? ? ?Physical Examination ? ?Vitals:  ? 11/11/21 1419  ?Temp: 97.6 ?F (36.4 ?C)  ?Weight: 169 lb 11.2 oz (77 kg)  ?Height: '5\' 7"'  (1.702 m)  ? ? ?Body mass index is 26.58 kg/m?. ? ?General:  Alert and oriented, no acute distress ?HEENT: Normal ?Neck: No bruit or JVD ?Pulmonary: Clear to auscultation bilaterally ?Cardiac: Regular Rate and Rhythm without murmur ?Gastrointestinal: Soft, non-tender, non-distended, no mass, no scars ?Skin: No rash ?Extremity Pulses:  2+ radial ?Musculoskeletal: No deformity or edema  ?Neurologic: Upper and lower extremity motor 5/5 and symmetric ? ?DATA:  ?   ?Right Carotid Findings:  ?+----------+--------+--------+--------+------------------+--------+  ?          PSV cm/sEDV cm/sStenosisPlaque DescriptionComments  ?+----------+--------+--------+--------+------------------+--------+  ?CCA Prox  73      10                                tortuous  ?+----------+--------+--------+--------+------------------+--------+  ?CCA Mid   62                                                   ?+----------+--------+--------+--------+------------------+--------+  ?CCA Distal55      9               heterogenous                ?+----------+--------+--------+--------+------------------+--------+  ?ICA Prox  47      11

## 2021-11-12 ENCOUNTER — Encounter: Payer: Self-pay | Admitting: Podiatry

## 2021-11-12 ENCOUNTER — Other Ambulatory Visit: Payer: Self-pay

## 2021-11-12 ENCOUNTER — Ambulatory Visit: Payer: Medicare PPO | Admitting: Podiatry

## 2021-11-12 DIAGNOSIS — E0821 Diabetes mellitus due to underlying condition with diabetic nephropathy: Secondary | ICD-10-CM | POA: Diagnosis not present

## 2021-11-12 DIAGNOSIS — B351 Tinea unguium: Secondary | ICD-10-CM | POA: Diagnosis not present

## 2021-11-12 DIAGNOSIS — L853 Xerosis cutis: Secondary | ICD-10-CM | POA: Diagnosis not present

## 2021-11-12 DIAGNOSIS — M792 Neuralgia and neuritis, unspecified: Secondary | ICD-10-CM | POA: Diagnosis not present

## 2021-11-12 DIAGNOSIS — M79675 Pain in left toe(s): Secondary | ICD-10-CM | POA: Diagnosis not present

## 2021-11-12 DIAGNOSIS — M79674 Pain in right toe(s): Secondary | ICD-10-CM

## 2021-11-12 NOTE — Progress Notes (Signed)
This patient returns to my office for at risk foot care.  This patient requires this care by a professional since this patient will be at risk due to having diabetic neuropathy and CKD   This patient is unable to cut nails himself since the patient cannot reach his nails.These nails are painful walking and wearing shoes.  This patient presents for at risk foot care today. ? ?General Appearance  Alert, conversant and in no acute stress. ? ?Vascular  Dorsalis pedis and posterior tibial  pulses are palpable  bilaterally.  Capillary return is within normal limits  bilaterally. Temperature is within normal limits  bilaterally. ? ?Neurologic  Senn-Weinstein monofilament wire test diminished bilaterally. Muscle power within normal limits bilaterally. ? ?Nails Thick disfigured discolored nails with subungual debris  from hallux to fifth toes bilaterally. No evidence of bacterial infection or drainage bilaterally. ? ?Orthopedic  No limitations of motion  feet .  No crepitus or effusions noted.  No bony pathology or digital deformities noted.  HAV  B/L  Hammer toes  B/L. ? ?Skin  normotropic skin with no porokeratosis noted bilaterally.  No signs of infections or ulcers noted.    ? ?Onychomycosis  Pain in right toes  Pain in left toes ? ?Consent was obtained for treatment procedures.   Mechanical debridement of nails 1-5  bilaterally performed with a nail nipper.  Filed with dremel without incident. Told him to use vaseline. ? ? ?Return office visit    9 weeks                 Told patient to return for periodic foot care and evaluation due to potential at risk complications. ? ? ?Gardiner Barefoot DPM   ?

## 2021-11-19 DIAGNOSIS — E1122 Type 2 diabetes mellitus with diabetic chronic kidney disease: Secondary | ICD-10-CM

## 2021-11-19 DIAGNOSIS — N184 Chronic kidney disease, stage 4 (severe): Secondary | ICD-10-CM | POA: Diagnosis not present

## 2021-11-19 DIAGNOSIS — I110 Hypertensive heart disease with heart failure: Secondary | ICD-10-CM | POA: Diagnosis not present

## 2021-11-19 DIAGNOSIS — E785 Hyperlipidemia, unspecified: Secondary | ICD-10-CM

## 2021-11-25 ENCOUNTER — Ambulatory Visit (INDEPENDENT_AMBULATORY_CARE_PROVIDER_SITE_OTHER)
Admission: RE | Admit: 2021-11-25 | Discharge: 2021-11-25 | Disposition: A | Discharge status: Home / Self Care | Payer: Medicare PPO | Source: Ambulatory Visit | Attending: Emergency Medicine | Admitting: Emergency Medicine

## 2021-11-25 DIAGNOSIS — R911 Solitary pulmonary nodule: Secondary | ICD-10-CM

## 2021-11-25 DIAGNOSIS — R918 Other nonspecific abnormal finding of lung field: Secondary | ICD-10-CM | POA: Diagnosis not present

## 2021-11-25 DIAGNOSIS — I7121 Aneurysm of the ascending aorta, without rupture: Secondary | ICD-10-CM | POA: Diagnosis not present

## 2021-11-26 ENCOUNTER — Ambulatory Visit (INDEPENDENT_AMBULATORY_CARE_PROVIDER_SITE_OTHER): Payer: Medicare PPO | Admitting: Pulmonary Disease

## 2021-11-26 ENCOUNTER — Encounter: Payer: Self-pay | Admitting: Pulmonary Disease

## 2021-11-26 VITALS — BP 150/52 | HR 57 | Temp 98.1°F | Ht 67.0 in | Wt 170.8 lb

## 2021-11-26 DIAGNOSIS — G4709 Other insomnia: Secondary | ICD-10-CM | POA: Diagnosis not present

## 2021-11-26 MED ORDER — ESZOPICLONE 1 MG PO TABS: 1.0000 mg | ORAL_TABLET | Freq: Every evening | ORAL | 1 refills | Status: DC | PRN

## 2021-11-26 NOTE — Assessment & Plan Note (Signed)
Pulmonary nodules on CT chest that we have following conservatively.  Planning for repeat study in April.  He is having some subscapular pain that does not have any radiographical correlate on CT. ? ?Please get your CT scan of the chest on 11/25/2021 as planned. ?[name] will call you after the scan is done to review together.  Based on the results we will decide whether to consider any further testing to evaluate your pulmonary nodules, including possible biopsy ?

## 2021-11-26 NOTE — Patient Instructions (Signed)
I am placing a prescription for a sleep aid ? ?It is a low-dose ?If you tolerate it okay, we can always go to a higher dose of it to help even more ? ?Have to be careful with side effects ? ?If it does not help at all, we can try a different agent ? ?Try to stay active during the day, no napping during the day ? ? ?Call with significant concerns ? ?I will see you in about 6-8 weeks ?

## 2021-11-26 NOTE — Progress Notes (Signed)
? ?      ?Austin [name]    [id number]    1935/02/15 ? ?Primary Care Physician:Nafziger, Tommi Rumps, NP ? ?Referring Physician: Dorothyann Peng, NP ?[place] ?[place],   [id number] ? ?Chief complaint:   ?Sleep onset and sleep maintenance insomnia  ?sleep difficulty for about the last year and a half ? ? ?HPI: ? ?Recent diagnosis of heart failure ? ?Usually tries to go to bed about 1230, may take him about half an hour to an hour to fall asleep ?About 2 awakenings but sometimes will wake up and not be able to go back to sleep for a couple of hours ?Final wake up time about 9 AM ? ?Did not have long-term sleep issues ? ?Does not snore ? ?History of hypertension, history of heart failure, history of diabetes, history of kidney disease ?History of prostate cancer ? ?Following up with [name] for lung nodules ? ?Not as active during the day any longer ?Does take naps during the day and sometimes may sleep for more than an hour ? ?Quit smoking many many years ago ? ?Outpatient Encounter Medications as of 11/26/2021  ?Medication Sig  ? ACCU-CHEK GUIDE test strip USED TO CHECK BLOOD GLUCOSE TWICE A DAY OR AS NEEDED  ? Accu-Chek Softclix Lancets lancets USED TO CHECK BLOOD GLUCOSE TWICE A DAY OR AS NEEDED (Patient taking differently: in the morning and at bedtime.)  ? acetaminophen (TYLENOL) 325 MG tablet Take 325 mg by mouth 4 (four) times daily.  ? albuterol (VENTOLIN HFA) 108 (90 Base) MCG/ACT inhaler TAKE 2 PUFFS BY MOUTH EVERY 6 HOURS AS NEEDED FOR WHEEZE OR SHORTNESS OF BREATH (Patient taking differently: Inhale 2 puffs into the lungs every 6 (six) hours as needed for shortness of breath or wheezing.)  ? amLODipine (NORVASC) 10 MG tablet TAKE 1 TABLET BY MOUTH EVERY DAY (Patient taking differently: Take 10 mg by mouth daily.)  ? atorvastatin (LIPITOR) 20 MG tablet TAKE 1 TABLET BY MOUTH EVERY DAY (Patient taking differently: Take 20 mg by mouth daily.)  ? Blood Glucose Monitoring Suppl (ACCU-CHEK AVIVA PLUS)  w/Device KIT Used to check blood glucose 2 times a day or PRN (Patient taking differently: in the morning and at bedtime.)  ? cholecalciferol (VITAMIN D3) 25 MCG (1000 UNIT) tablet Take 1,000 Units by mouth daily.  ? cloNIDine (CATAPRES) 0.3 MG tablet Take 1 tablet (0.3 mg total) by mouth 3 (three) times daily.  ? clopidogrel (PLAVIX) 75 MG tablet TAKE 1 TABLET BY MOUTH EVERY DAY (Patient taking differently: Take 75 mg by mouth daily.)  ? doxazosin (CARDURA) 2 MG tablet Take 2 mg by mouth at bedtime.   ? esomeprazole (NEXIUM) 40 MG capsule TAKE 1 CAPSULE BY MOUTH EVERY DAY (Patient taking differently: 40 mg daily.)  ? eszopiclone (LUNESTA) 1 MG TABS tablet Take 1 tablet (1 mg total) by mouth at bedtime as needed for sleep. Take immediately before bedtime  ? hydrocortisone (ANUSOL-HC) 2.5 % rectal cream Place 1 application rectally 2 (two) times daily.  ? isosorbide-hydrALAZINE (BIDIL) 20-37.5 MG tablet Take 1 tablet by mouth 3 (three) times daily.  ? KLOR-CON M10 10 MEQ tablet Take by mouth.  ? lactulose (CHRONULAC) 10 GM/15ML solution TAKE 15 ML BY MOUTH 2 TIMES DAILY AS NEEDED FOR MILD CONSTIPATION.  ? methylcellulose (CITRUCEL) oral powder Take 1 packet by mouth See admin instructions. Every 2-3 days,alternating with lactulose  ? potassium chloride SA (KLOR-CON M) 20 MEQ tablet Take 1 tablet (20 mEq total) by  mouth 2 (two) times daily.  ? Propylene Glycol (SYSTANE BALANCE OP) Place 1 drop into both eyes at bedtime.  ? triamcinolone cream (KENALOG) 0.1 % Apply 1 application topically daily as needed (dry/irritated skin).  ? bisacodyl (DULCOLAX) 10 MG suppository Place 1 suppository (10 mg total) rectally daily as needed for mild constipation. (Patient not taking: Reported on 11/11/2021)  ? metoprolol succinate (TOPROL-XL) 100 MG 24 hr tablet Take 1 tablet (100 mg total) by mouth daily. Take with or immediately following a meal.  ? torsemide (DEMADEX) 20 MG tablet Take 2 tablets (40 mg total) by mouth 2 (two) times  daily.  ? ?Facility-Administered Encounter Medications as of 11/26/2021  ?Medication  ? ammonium lactate (LAC-HYDRIN) 12 % lotion  ? ? ?Allergies as of 11/26/2021 - Review Complete 11/26/2021  ?Allergen Reaction Noted  ? Aspirin Other (See Comments) 02/20/2012  ? ? ?Past Medical History:  ?Diagnosis Date  ? Acute diastolic CHF (congestive heart failure) ([place]) 09/17/2020  ? Acute exacerbation of CHF (congestive heart failure) ([place]) 08/04/2019  ? ANEMIA DUE TO CHRONIC BLOOD LOSS 03/13/2007  ? CAROTID ARTERY STENOSIS 05/10/2010  ? CHF (congestive heart failure) ([place])   ? DIABETES MELLITUS, TYPE II 09/19/2007  ? DISEASE, CEREBROVASCULAR NEC 03/05/2007  ? GERD 03/13/2007  ? HYPERLIPIDEMIA 03/05/2007  ? HYPERTENSION 03/05/2007  ? HYPOKALEMIA 11/09/2009  ? KNEE PAIN, RIGHT 11/09/2009  ? PROSTATE CANCER, HX OF 03/05/2007  ? RENAL DISEASE, CHRONIC 02/03/2009  ? ? ?Past Surgical History:  ?Procedure Laterality Date  ? CAROTID ARTERY ANGIOPLASTY Right Oct. 10, 2001  ? ESOPHAGOGASTRODUODENOSCOPY (EGD) WITH PROPOFOL N/A 11/04/2016  ? Procedure: ESOPHAGOGASTRODUODENOSCOPY (EGD) WITH PROPOFOL;  Surgeon: Otis Brace, MD;  Location: [facility];  Service: Gastroenterology;  Laterality: N/A;  ? LAPAROSCOPIC APPENDECTOMY  02/20/2012  ? Procedure: APPENDECTOMY LAPAROSCOPIC;  Surgeon: Stark Klein, MD;  Location: [facility];  Service: General;  Laterality: N/A;  ? PROSTATE SURGERY    ? prostatectomy  ? ? ?Family History  ?Problem Relation Age of Onset  ? Hypertension Mother   ? Cancer Father   ?     Mesothelioma   ? Stomach cancer Brother   ? Cancer Brother   ? Cancer - Cervical Brother   ? Cancer Brother   ? Diabetes Brother   ? Esophageal cancer Neg Hx   ? Colon cancer Neg Hx   ? Pancreatic cancer Neg Hx   ? ? ?Social History  ? ?Socioeconomic History  ? Marital status: Married  ?  Spouse name: Not on file  ? Number of children: Not on file  ? Years of education: Not on file  ? Highest education level: Not on file  ?Occupational History  ? Not on  file  ?Tobacco Use  ? Smoking status: Former  ?  Packs/day: 1.00  ?  Years: 10.00  ?  Pack years: 10.00  ?  Types: Cigarettes  ?  Quit date: 08/22/1974  ?  Years since quitting: 8.2  ? Smokeless tobacco: Never  ?Vaping Use  ? Vaping Use: Never used  ?Substance and Sexual Activity  ? Alcohol use: No  ?  Alcohol/week: 0.0 standard drinks  ? Drug use: No  ? Sexual activity: Not Currently  ?Other Topics Concern  ? Not on file  ?Social History Narrative  ? Retired Editor, commissioning   ? Married 53 years   ?   ? He enjoys traveling   ? ?Social Determinants of Health  ? ?Financial Resource Strain: Low Risk   ?  Difficulty of Paying Living Expenses: Not hard at all  ?Food Insecurity: No Food Insecurity  ? Worried About Charity fundraiser in the Last Year: Never true  ? Ran Out of Food in the Last Year: Never true  ?Transportation Needs: No Transportation Needs  ? Lack of Transportation (Medical): No  ? Lack of Transportation (Non-Medical): No  ?Physical Activity: Inactive  ? Days of Exercise per Week: 0 days  ? Minutes of Exercise per Session: 0 min  ?Stress: No Stress Concern Present  ? Feeling of Stress : Not at all  ?Social Connections: Moderately Integrated  ? Frequency of Communication with Friends and Family: Twice a week  ? Frequency of Social Gatherings with Friends and Family: Twice a week  ? Attends Religious Services: More than 4 times per year  ? Active Member of Clubs or Organizations: No  ? Attends Archivist Meetings: Never  ? Marital Status: Married  ?Intimate Partner Violence: Not on file  ? ? ?Review of Systems  ?Constitutional:  Positive for fatigue.  ?Psychiatric/Behavioral:  Positive for sleep disturbance.   ? ?Vitals:  ? 11/26/21 1100  ?BP: (!) 150/52  ?Pulse: (!) 57  ?Temp: 98.1 ?F (36.7 ?C)  ?SpO2: 98%  ? ? ? ?Physical Exam ?Constitutional:   ?   Appearance: Normal appearance.  ?HENT:  ?   Head: Normocephalic.  ?   Mouth/Throat:  ?   Mouth: Mucous membranes are moist.  ?Cardiovascular:  ?    Rate and Rhythm: Normal rate and regular rhythm.  ?   Heart sounds: No murmur heard. ?  No friction rub.  ?Pulmonary:  ?   Effort: No respiratory distress.  ?   Breath sounds: No stridor. No wheezing or rh

## 2021-11-30 ENCOUNTER — Other Ambulatory Visit: Payer: Self-pay

## 2021-11-30 DIAGNOSIS — R911 Solitary pulmonary nodule: Secondary | ICD-10-CM

## 2021-12-07 ENCOUNTER — Ambulatory Visit (INDEPENDENT_AMBULATORY_CARE_PROVIDER_SITE_OTHER): Payer: Medicare PPO

## 2021-12-07 NOTE — Patient Instructions (Signed)
Visit Information ? ?Thank you for taking time to visit with me today. Please don't hesitate to contact me if I can be of assistance to you before our next scheduled telephone appointment. ? ?Following are the goals we discussed today:  ?Take all medications as prescribed ?Attend all scheduled provider appointments ?Call pharmacy for medication refills 3-7 days in advance of running out of medications ?Call provider office for new concerns or questions  ?call office if I gain more than 2 pounds in one day or 5 pounds in one week ?keep legs up while sitting ?track weight in diary ?watch for swelling in feet, ankles and legs every day ?weigh myself daily ?follow rescue plan if symptoms flare-up ?eat more whole grains, fruits and vegetables, lean meats and healthy fats ?track symptoms and what helps feel better or worse ?keep appointment with eye doctor ?check blood sugar at prescribed times: once daily and when you have symptoms of low or high blood sugar ?check feet daily for cuts, sores or redness ?take the blood sugar log to all doctor visits ?fill half of plate with vegetables ?manage portion size ?switch to sugar-free drinks ?keep feet up while sitting ?wash and dry feet carefully every day ?wear comfortable, cotton socks ?wear comfortable, well-fitting shoes ?check blood pressure daily ?choose a place to take my blood pressure (home, clinic or office, retail store) ?take blood pressure log to all doctor appointments ?call doctor for signs and symptoms of high blood pressure ?eat more whole grains, fruits and vegetables, lean meats and healthy fats ?limit salt intake to '2000mg'$ /day ?call for medicine refill 2 or 3 days before it runs out ?take all medications exactly as prescribed ?call doctor with any symptoms you believe are related to your medicine ? ?Our next appointment is by telephone on 01/24/22 at 2:15 PM ? ?Please call the care guide team at [phone] if you need to cancel or reschedule your appointment.   ? ?If you are experiencing a Mental Health or [place] or need someone to talk to, please call the Suicide and Crisis Lifeline: 988 ?call the [place] National Suicide Prevention Lifeline: 513-398-7069 or TTY: 6066975757 TTY (443)760-6296) to talk to a trained counselor ?call 1-800-273-TALK (toll free, 24 hour hotline) ?go to [facility] [address] [place] [phone]) ?call 911  ? ?The patient verbalized understanding of instructions, educational materials, and care plan provided today and agreed to receive a mailed copy of patient instructions, educational materials, and care plan.  ? ?Peter Garter RN, BSN,CCM, CDE ?Care Management Coordinator ?[facility] ?[phone]   ?

## 2021-12-07 NOTE — Chronic Care Management (AMB) (Signed)
?Chronic Care Management  ? ?CCM RN Visit Note ? ?12/07/2021 ?Name: Austin [name] MRN: [id number] DOB: Jan 17, 1935 ? ?Subjective: ?Austin [name] is a 87 y.o. year old male who is a primary care patient of Dorothyann Peng, NP. The care management team was consulted for assistance with disease management and care coordination needs.   ? ?Engaged with patient by telephone for follow up visit in response to provider referral for case management and/or care coordination services.  ? ?Consent to Services:  ?The patient was given information about Chronic Care Management services, agreed to services, and gave verbal consent prior to initiation of services.  Please see initial visit note for detailed documentation.  ? ?Patient agreed to services and verbal consent obtained.  ? ?Assessment: Review of patient past medical history, allergies, medications, health status, including review of consultants reports, laboratory and other test data, was performed as part of comprehensive evaluation and provision of chronic care management services.  ? ?SDOH (Social Determinants of Health) assessments and interventions performed:   ? ?CCM Care Plan ? ?Allergies  ?Allergen Reactions  ? Aspirin Other (See Comments)  ?  High doses causes stomach ulcer and bleeding  ? ? ?Outpatient Encounter Medications as of 12/07/2021  ?Medication Sig  ? ACCU-CHEK GUIDE test strip USED TO CHECK BLOOD GLUCOSE TWICE A DAY OR AS NEEDED  ? Accu-Chek Softclix Lancets lancets USED TO CHECK BLOOD GLUCOSE TWICE A DAY OR AS NEEDED (Patient taking differently: in the morning and at bedtime.)  ? acetaminophen (TYLENOL) 325 MG tablet Take 325 mg by mouth 4 (four) times daily.  ? albuterol (VENTOLIN HFA) 108 (90 Base) MCG/ACT inhaler TAKE 2 PUFFS BY MOUTH EVERY 6 HOURS AS NEEDED FOR WHEEZE OR SHORTNESS OF BREATH (Patient taking differently: Inhale 2 puffs into the lungs every 6 (six) hours as needed for shortness of breath or wheezing.)  ? amLODipine (NORVASC) 10 MG tablet TAKE  1 TABLET BY MOUTH EVERY DAY (Patient taking differently: Take 10 mg by mouth daily.)  ? atorvastatin (LIPITOR) 20 MG tablet TAKE 1 TABLET BY MOUTH EVERY DAY (Patient taking differently: Take 20 mg by mouth daily.)  ? bisacodyl (DULCOLAX) 10 MG suppository Place 1 suppository (10 mg total) rectally daily as needed for mild constipation. (Patient not taking: Reported on 11/11/2021)  ? Blood Glucose Monitoring Suppl (ACCU-CHEK AVIVA PLUS) w/Device KIT Used to check blood glucose 2 times a day or PRN (Patient taking differently: in the morning and at bedtime.)  ? cholecalciferol (VITAMIN D3) 25 MCG (1000 UNIT) tablet Take 1,000 Units by mouth daily.  ? cloNIDine (CATAPRES) 0.3 MG tablet Take 1 tablet (0.3 mg total) by mouth 3 (three) times daily.  ? clopidogrel (PLAVIX) 75 MG tablet TAKE 1 TABLET BY MOUTH EVERY DAY (Patient taking differently: Take 75 mg by mouth daily.)  ? doxazosin (CARDURA) 2 MG tablet Take 2 mg by mouth at bedtime.   ? esomeprazole (NEXIUM) 40 MG capsule TAKE 1 CAPSULE BY MOUTH EVERY DAY (Patient taking differently: 40 mg daily.)  ? eszopiclone (LUNESTA) 1 MG TABS tablet Take 1 tablet (1 mg total) by mouth at bedtime as needed for sleep. Take immediately before bedtime  ? hydrocortisone (ANUSOL-HC) 2.5 % rectal cream Place 1 application rectally 2 (two) times daily.  ? isosorbide-hydrALAZINE (BIDIL) 20-37.5 MG tablet Take 1 tablet by mouth 3 (three) times daily.  ? KLOR-CON M10 10 MEQ tablet Take by mouth.  ? lactulose (CHRONULAC) 10 GM/15ML solution TAKE 15 ML BY MOUTH 2 TIMES DAILY AS NEEDED  FOR MILD CONSTIPATION.  ? methylcellulose (CITRUCEL) oral powder Take 1 packet by mouth See admin instructions. Every 2-3 days,alternating with lactulose  ? metoprolol succinate (TOPROL-XL) 100 MG 24 hr tablet Take 1 tablet (100 mg total) by mouth daily. Take with or immediately following a meal.  ? potassium chloride SA (KLOR-CON M) 20 MEQ tablet Take 1 tablet (20 mEq total) by mouth 2 (two) times daily.  ?  Propylene Glycol (SYSTANE BALANCE OP) Place 1 drop into both eyes at bedtime.  ? torsemide (DEMADEX) 20 MG tablet Take 2 tablets (40 mg total) by mouth 2 (two) times daily.  ? triamcinolone cream (KENALOG) 0.1 % Apply 1 application topically daily as needed (dry/irritated skin).  ? ?Facility-Administered Encounter Medications as of 12/07/2021  ?Medication  ? ammonium lactate (LAC-HYDRIN) 12 % lotion  ? ? ?Patient Active Problem List  ? Diagnosis Date Noted  ? UTI (urinary tract infection) 09/27/2021  ? CHF exacerbation ([place]) 09/26/2021  ? Acute respiratory failure with hypoxia ([place]) 09/26/2021  ? Acute on chronic diastolic CHF (congestive heart failure) ([place]) 07/09/2021  ? Dyspnea 06/01/2021  ? Upper airway cough syndrome 04/06/2021  ? Porokeratosis 03/26/2021  ? Pulmonary nodule 1 cm or greater in diameter 10/22/2020  ? Acute on chronic renal failure ([place]) 10/09/2020  ? Mass of right lung 09/18/2020  ? Acute kidney injury superimposed on CKD ([place]) 08/04/2019  ? Elevated troponin 08/04/2019  ? Hypokalemia 08/04/2019  ? Hypertensive urgency 03/28/2019  ? Elevated serum immunoglobulin free light chains 02/20/2019  ? Gastrointestinal hemorrhage with melena   ? Melena 11/03/2016  ? Carotid artery stenosis 05/10/2010  ? Type 2 diabetes mellitus with hyperlipidemia ([place]) 09/19/2007  ? Iron deficiency anemia due to chronic blood loss 03/13/2007  ? GERD 03/13/2007  ? Essential hypertension 03/05/2007  ? DISEASE, CEREBROVASCULAR NEC 03/05/2007  ? PROSTATE CANCER, HX OF 03/05/2007  ? ? ?Conditions to be addressed/monitored:CHF, HTN, HLD, DMII, and CKD Stage 4 ? ?Care Plan : RN Care Manager Plan of Care  ?Updates made by Dimitri Ped, RN since 12/07/2021 12:00 AM  ?  ? ?Problem: Chronic Disease Management and Care Coordination Needs (CHF, CKD4, DM,HTN and HLD)   ?Priority: High  ?  ? ?Long-Range Goal: Establish Plan of Care for Chronic Disease Management Needs (CHF, CKD4, DM,HTN and HLD)   ?Start Date: 09/02/2021  ?Expected  End Date: 08/30/2022  ?Priority: High  ?Note:   ?Current Barriers:  ?Knowledge Deficits related to plan of care for management of CHF, HTN, HLD, DMII, and CKD Stage 4  ?Chronic Disease Management support and education needs related to CHF, HTN, HLD, DMII, and CKD Stage 4  ?States he has been feeling good. States his kidneys have been improving and he has not needed to go on dialysis.  States he is to see the nephrologist again on 12/16/21 States he has completed PT.  States he is trying to walk every day.  Denies any chest pains, shortness of breath or increased swelling.  States he is trying to weigh every day and he weighted 174 this morning.   States his B/P was 150/65 when last checked.  States he has not been checking his B/P everyday. States his CBG have ranged 105-137 with no low readings. States he is trying to follow healthy diet and he can no longer have diary products due to his kidneys ?RNCM Clinical Goal(s):  ?Patient will verbalize understanding of plan for management of CHF, HTN, HLD, DMII, and CKD Stage 4 as evidenced  by voiced adherence to plan of care ?verbalize basic understanding of  CHF, HTN, HLD, DMII, and CKD Stage 4 disease process and self health management plan as evidenced by voiced understanding and teach back ?take all medications exactly as prescribed and will call provider for medication related questions as evidenced by dispense report and pt verbalization ?attend all scheduled medical appointments: Nephrology 12/16/21, podiatry 01/19/22  as evidenced by medical records ?demonstrate Improved adherence to prescribed treatment plan for CHF, HTN, HLD, DMII, and CKD Stage 4 as evidenced by readings within limits, voiced adherence to plan of care ?continue to work with RN Care Manager to address care management and care coordination needs related to  CHF, HTN, HLD, DMII, and CKD Stage 4 as evidenced by adherence to CM Team Scheduled appointments ?not experience hospital admission as evidenced  by review of EMR. Hospital Admissions in last 6 months = 2 ?demonstrate a decrease in CHF, HTN, HLD, DMII, and CKD Stage 4 exacerbations as evidenced by pt reports and no hospitalizations  through coll

## 2021-12-08 DIAGNOSIS — N185 Chronic kidney disease, stage 5: Secondary | ICD-10-CM | POA: Diagnosis not present

## 2021-12-16 DIAGNOSIS — R809 Proteinuria, unspecified: Secondary | ICD-10-CM | POA: Diagnosis not present

## 2021-12-16 DIAGNOSIS — N185 Chronic kidney disease, stage 5: Secondary | ICD-10-CM | POA: Diagnosis not present

## 2021-12-16 DIAGNOSIS — I5032 Chronic diastolic (congestive) heart failure: Secondary | ICD-10-CM | POA: Diagnosis not present

## 2021-12-16 DIAGNOSIS — E1122 Type 2 diabetes mellitus with diabetic chronic kidney disease: Secondary | ICD-10-CM | POA: Diagnosis not present

## 2021-12-16 DIAGNOSIS — N2581 Secondary hyperparathyroidism of renal origin: Secondary | ICD-10-CM | POA: Diagnosis not present

## 2021-12-16 DIAGNOSIS — R918 Other nonspecific abnormal finding of lung field: Secondary | ICD-10-CM | POA: Diagnosis not present

## 2021-12-16 DIAGNOSIS — D631 Anemia in chronic kidney disease: Secondary | ICD-10-CM | POA: Diagnosis not present

## 2021-12-16 DIAGNOSIS — I120 Hypertensive chronic kidney disease with stage 5 chronic kidney disease or end stage renal disease: Secondary | ICD-10-CM | POA: Diagnosis not present

## 2021-12-16 DIAGNOSIS — E876 Hypokalemia: Secondary | ICD-10-CM | POA: Diagnosis not present

## 2021-12-19 DIAGNOSIS — E1169 Type 2 diabetes mellitus with other specified complication: Secondary | ICD-10-CM

## 2021-12-19 DIAGNOSIS — I110 Hypertensive heart disease with heart failure: Secondary | ICD-10-CM

## 2021-12-19 DIAGNOSIS — E785 Hyperlipidemia, unspecified: Secondary | ICD-10-CM

## 2021-12-19 DIAGNOSIS — N184 Chronic kidney disease, stage 4 (severe): Secondary | ICD-10-CM | POA: Diagnosis not present

## 2021-12-23 ENCOUNTER — Other Ambulatory Visit: Payer: Self-pay | Admitting: Adult Health

## 2022-01-03 ENCOUNTER — Other Ambulatory Visit: Payer: Self-pay | Admitting: Adult Health

## 2022-01-06 DIAGNOSIS — N185 Chronic kidney disease, stage 5: Secondary | ICD-10-CM | POA: Diagnosis not present

## 2022-01-06 DIAGNOSIS — N189 Chronic kidney disease, unspecified: Secondary | ICD-10-CM | POA: Diagnosis not present

## 2022-01-11 ENCOUNTER — Other Ambulatory Visit: Payer: Self-pay | Admitting: Adult Health

## 2022-01-11 DIAGNOSIS — K5900 Constipation, unspecified: Secondary | ICD-10-CM

## 2022-01-13 DIAGNOSIS — E113291 Type 2 diabetes mellitus with mild nonproliferative diabetic retinopathy without macular edema, right eye: Secondary | ICD-10-CM | POA: Diagnosis not present

## 2022-01-13 DIAGNOSIS — H43823 Vitreomacular adhesion, bilateral: Secondary | ICD-10-CM | POA: Diagnosis not present

## 2022-01-13 DIAGNOSIS — E113212 Type 2 diabetes mellitus with mild nonproliferative diabetic retinopathy with macular edema, left eye: Secondary | ICD-10-CM | POA: Diagnosis not present

## 2022-01-13 DIAGNOSIS — H43813 Vitreous degeneration, bilateral: Secondary | ICD-10-CM | POA: Diagnosis not present

## 2022-01-13 DIAGNOSIS — H353231 Exudative age-related macular degeneration, bilateral, with active choroidal neovascularization: Secondary | ICD-10-CM | POA: Diagnosis not present

## 2022-01-14 DIAGNOSIS — D631 Anemia in chronic kidney disease: Secondary | ICD-10-CM | POA: Diagnosis not present

## 2022-01-14 DIAGNOSIS — N2581 Secondary hyperparathyroidism of renal origin: Secondary | ICD-10-CM | POA: Diagnosis not present

## 2022-01-14 DIAGNOSIS — E1122 Type 2 diabetes mellitus with diabetic chronic kidney disease: Secondary | ICD-10-CM | POA: Diagnosis not present

## 2022-01-14 DIAGNOSIS — I351 Nonrheumatic aortic (valve) insufficiency: Secondary | ICD-10-CM | POA: Diagnosis not present

## 2022-01-14 DIAGNOSIS — I350 Nonrheumatic aortic (valve) stenosis: Secondary | ICD-10-CM | POA: Diagnosis not present

## 2022-01-14 DIAGNOSIS — I10 Essential (primary) hypertension: Secondary | ICD-10-CM | POA: Diagnosis not present

## 2022-01-14 DIAGNOSIS — I5030 Unspecified diastolic (congestive) heart failure: Secondary | ICD-10-CM | POA: Diagnosis not present

## 2022-01-14 DIAGNOSIS — I120 Hypertensive chronic kidney disease with stage 5 chronic kidney disease or end stage renal disease: Secondary | ICD-10-CM | POA: Diagnosis not present

## 2022-01-14 DIAGNOSIS — R809 Proteinuria, unspecified: Secondary | ICD-10-CM | POA: Diagnosis not present

## 2022-01-14 DIAGNOSIS — N185 Chronic kidney disease, stage 5: Secondary | ICD-10-CM | POA: Diagnosis not present

## 2022-01-14 DIAGNOSIS — R918 Other nonspecific abnormal finding of lung field: Secondary | ICD-10-CM | POA: Diagnosis not present

## 2022-01-14 DIAGNOSIS — I5032 Chronic diastolic (congestive) heart failure: Secondary | ICD-10-CM | POA: Diagnosis not present

## 2022-01-14 DIAGNOSIS — E876 Hypokalemia: Secondary | ICD-10-CM | POA: Diagnosis not present

## 2022-01-19 ENCOUNTER — Encounter: Payer: Self-pay | Admitting: Podiatry

## 2022-01-19 ENCOUNTER — Ambulatory Visit: Payer: Medicare PPO | Admitting: Podiatry

## 2022-01-19 DIAGNOSIS — B351 Tinea unguium: Secondary | ICD-10-CM | POA: Diagnosis not present

## 2022-01-19 DIAGNOSIS — M79675 Pain in left toe(s): Secondary | ICD-10-CM | POA: Diagnosis not present

## 2022-01-19 DIAGNOSIS — E0821 Diabetes mellitus due to underlying condition with diabetic nephropathy: Secondary | ICD-10-CM

## 2022-01-19 DIAGNOSIS — Q828 Other specified congenital malformations of skin: Secondary | ICD-10-CM | POA: Diagnosis not present

## 2022-01-19 DIAGNOSIS — M792 Neuralgia and neuritis, unspecified: Secondary | ICD-10-CM

## 2022-01-19 DIAGNOSIS — M79674 Pain in right toe(s): Secondary | ICD-10-CM

## 2022-01-19 NOTE — Progress Notes (Signed)
This patient returns to my office for at risk foot care.  This patient requires this care by a professional since this patient will be at risk due to having diabetic neuropathy and CKD   This patient is unable to cut nails himself since the patient cannot reach his nails.These nails are painful walking and wearing shoes.  This patient presents for at risk foot care today.  General Appearance  Alert, conversant and in no acute stress.  Vascular  Dorsalis pedis and posterior tibial  pulses are palpable  bilaterally.  Capillary return is within normal limits  bilaterally. Temperature is within normal limits  bilaterally.  Neurologic  Senn-Weinstein monofilament wire test diminished bilaterally. Muscle power within normal limits bilaterally.  Nails Thick disfigured discolored nails with subungual debris  from hallux to fifth toes bilaterally. No evidence of bacterial infection or drainage bilaterally.  Orthopedic  No limitations of motion  feet .  No crepitus or effusions noted.  No bony pathology or digital deformities noted.  HAV  B/L  Hammer toes  B/L.  Skin  normotropic skin noted bilaterally.  No signs of infections or ulcers noted.   Porokeratosis sub  3,4 and 5 left foot.  Onychomycosis  Pain in right toes  Pain in left toes  Porokeratosis left foot.  Consent was obtained for treatment procedures.   Mechanical debridement of nails 1-5  bilaterally performed with a nail nipper.  Filed with dremel without incident. Debride porokeratosis with # 15 blade and dremel tool.   Return office visit    9 weeks                 Told patient to return for periodic foot care and evaluation due to potential at risk complications.   Kylyn Mcdade DPM   

## 2022-01-24 ENCOUNTER — Ambulatory Visit (INDEPENDENT_AMBULATORY_CARE_PROVIDER_SITE_OTHER): Payer: Medicare PPO

## 2022-01-24 DIAGNOSIS — N184 Chronic kidney disease, stage 4 (severe): Secondary | ICD-10-CM

## 2022-01-24 DIAGNOSIS — I10 Essential (primary) hypertension: Secondary | ICD-10-CM

## 2022-01-24 DIAGNOSIS — I5033 Acute on chronic diastolic (congestive) heart failure: Secondary | ICD-10-CM

## 2022-01-24 DIAGNOSIS — E1169 Type 2 diabetes mellitus with other specified complication: Secondary | ICD-10-CM

## 2022-01-24 NOTE — Chronic Care Management (AMB) (Signed)
Chronic Care Management   CCM RN Visit Note  01/24/2022 Name: Austin [name] MRN: [id number] DOB: 11-Nov-1934  Subjective: Austin [name] is a 87 y.o. year old male who is a primary care patient of Dorothyann Peng, NP. The care management team was consulted for assistance with disease management and care coordination needs.    Engaged with patient by telephone for follow up visit in response to provider referral for case management and/or care coordination services.   Consent to Services:  The patient was given information about Chronic Care Management services, agreed to services, and gave verbal consent prior to initiation of services.  Please see initial visit note for detailed documentation.   Patient agreed to services and verbal consent obtained.   Assessment: Review of patient past medical history, allergies, medications, health status, including review of consultants reports, laboratory and other test data, was performed as part of comprehensive evaluation and provision of chronic care management services.   SDOH (Social Determinants of Health) assessments and interventions performed:    CCM Care Plan  Allergies  Allergen Reactions   Aspirin Other (See Comments)    High doses causes stomach ulcer and bleeding    Outpatient Encounter Medications as of 01/24/2022  Medication Sig   ACCU-CHEK GUIDE test strip USE TO CHECK BLOOD GLUCOSE TWICE A DAY OR AS NEEDED   Accu-Chek Softclix Lancets lancets USED TO CHECK BLOOD GLUCOSE TWICE A DAY OR AS NEEDED (Patient taking differently: in the morning and at bedtime.)   acetaminophen (TYLENOL) 325 MG tablet Take 325 mg by mouth 4 (four) times daily.   albuterol (VENTOLIN HFA) 108 (90 Base) MCG/ACT inhaler TAKE 2 PUFFS BY MOUTH EVERY 6 HOURS AS NEEDED FOR WHEEZE OR SHORTNESS OF BREATH (Patient taking differently: Inhale 2 puffs into the lungs every 6 (six) hours as needed for shortness of breath or wheezing.)   amLODipine (NORVASC) 10 MG tablet TAKE 1  TABLET BY MOUTH EVERY DAY   atorvastatin (LIPITOR) 20 MG tablet TAKE 1 TABLET BY MOUTH EVERY DAY   bisacodyl (DULCOLAX) 10 MG suppository Place 1 suppository (10 mg total) rectally daily as needed for mild constipation. (Patient not taking: Reported on 11/11/2021)   Blood Glucose Monitoring Suppl (ACCU-CHEK AVIVA PLUS) w/Device KIT Used to check blood glucose 2 times a day or PRN (Patient taking differently: in the morning and at bedtime.)   calcitRIOL (ROCALTROL) 0.25 MCG capsule Take 0.25 mcg by mouth 3 (three) times a week.   cholecalciferol (VITAMIN D3) 25 MCG (1000 UNIT) tablet Take 1,000 Units by mouth daily.   cloNIDine (CATAPRES) 0.3 MG tablet Take 1 tablet (0.3 mg total) by mouth 3 (three) times daily.   clopidogrel (PLAVIX) 75 MG tablet TAKE 1 TABLET BY MOUTH EVERY DAY (Patient taking differently: Take 75 mg by mouth daily.)   doxazosin (CARDURA) 2 MG tablet Take 2 mg by mouth at bedtime.    esomeprazole (NEXIUM) 40 MG capsule TAKE 1 CAPSULE BY MOUTH EVERY DAY (Patient taking differently: 40 mg daily.)   eszopiclone (LUNESTA) 1 MG TABS tablet Take 1 tablet (1 mg total) by mouth at bedtime as needed for sleep. Take immediately before bedtime   glipiZIDE (GLUCOTROL XL) 5 MG 24 hr tablet Take by mouth. (Patient not taking: Reported on 01/24/2022)   hydrocortisone (ANUSOL-HC) 2.5 % rectal cream Place 1 application rectally 2 (two) times daily.   isosorbide-hydrALAZINE (BIDIL) 20-37.5 MG tablet Take 1 tablet by mouth 3 (three) times daily.   KLOR-CON M10 10 MEQ tablet Take by  mouth.   lactulose (CHRONULAC) 10 GM/15ML solution TAKE 15 ML BY MOUTH 2 TIMES DAILY AS NEEDED FOR MILD CONSTIPATION.   methylcellulose (CITRUCEL) oral powder Take 1 packet by mouth See admin instructions. Every 2-3 days,alternating with lactulose   metoprolol succinate (TOPROL-XL) 100 MG 24 hr tablet Take 1 tablet (100 mg total) by mouth daily. Take with or immediately following a meal.   potassium chloride SA (KLOR-CON M)  20 MEQ tablet Take 1 tablet (20 mEq total) by mouth 2 (two) times daily.   Propylene Glycol (SYSTANE BALANCE OP) Place 1 drop into both eyes at bedtime.   torsemide (DEMADEX) 20 MG tablet Take 2 tablets (40 mg total) by mouth 2 (two) times daily.   triamcinolone cream (KENALOG) 0.1 % Apply 1 application topically daily as needed (dry/irritated skin).   Facility-Administered Encounter Medications as of 01/24/2022  Medication   ammonium lactate (LAC-HYDRIN) 12 % lotion    Patient Active Problem List   Diagnosis Date Noted   UTI (urinary tract infection) 09/27/2021   CHF exacerbation ([place]) 09/26/2021   Acute respiratory failure with hypoxia ([place]) 09/26/2021   Acute on chronic diastolic CHF (congestive heart failure) ([place]) 07/09/2021   Dyspnea 06/01/2021   Upper airway cough syndrome 04/06/2021   Porokeratosis 03/26/2021   Pulmonary nodule 1 cm or greater in diameter 10/22/2020   Acute on chronic renal failure ([place]) 10/09/2020   Mass of right lung 09/18/2020   Acute kidney injury superimposed on CKD ([place]) 08/04/2019   Elevated troponin 08/04/2019   Hypokalemia 08/04/2019   Hypertensive urgency 03/28/2019   Elevated serum immunoglobulin free light chains 02/20/2019   Gastrointestinal hemorrhage with melena    Melena 11/03/2016   Carotid artery stenosis 05/10/2010   Type 2 diabetes mellitus with hyperlipidemia ([place]) 09/19/2007   Iron deficiency anemia due to chronic blood loss 03/13/2007   GERD 03/13/2007   Essential hypertension 03/05/2007   DISEASE, CEREBROVASCULAR NEC 03/05/2007   PROSTATE CANCER, HX OF 03/05/2007    Conditions to be addressed/monitored:CHF, HTN, HLD, DMII, and CKD Stage 4  Care Plan : RN Care Manager Plan of Care  Updates made by Dimitri Ped, RN since 01/24/2022 12:00 AM     Problem: Chronic Disease Management and Care Coordination Needs (CHF, CKD4, DM,HTN and HLD)   Priority: High     Long-Range Goal: Establish Plan of Care for Chronic Disease  Management Needs (CHF, CKD4, DM,HTN and HLD)   Start Date: 09/02/2021  Expected End Date: 08/30/2022  Priority: High  Note:   Current Barriers:  Knowledge Deficits related to plan of care for management of CHF, HTN, HLD, DMII, and CKD Stage 4  Chronic Disease Management support and education needs related to CHF, HTN, HLD, DMII, and CKD Stage 4  States he has been feeling good. States his kidneys have been improving and he has not needed to go on dialysis.  States he is to see the nephrologist again on 02/14/22 States he saw her last week and his kidney function was staying good.  States he is trying to walk around [place] for exercise.  Denies any chest pains, shortness of breath or increased swelling.  States he is trying to weigh every day and he weighted 177 this morning.   States his B/P was last checked at his doctors visits last week and it was 105/64 at the nephrologist.  States he has not been checking his B/P everyday. States his CBG have ranged 122-140 with no low readings. States he is trying  to follow healthy diet and he can no longer have diary products due to his kidneys RNCM Clinical Goal(s):  Patient will verbalize understanding of plan for management of CHF, HTN, HLD, DMII, and CKD Stage 4 as evidenced by voiced adherence to plan of care verbalize basic understanding of  CHF, HTN, HLD, DMII, and CKD Stage 4 disease process and self health management plan as evidenced by voiced understanding and teach back take all medications exactly as prescribed and will call provider for medication related questions as evidenced by dispense report and pt verbalization attend all scheduled medical appointments: Nephrology 02/14/22, podiatry 03/28/22  as evidenced by medical records demonstrate Improved adherence to prescribed treatment plan for CHF, HTN, HLD, DMII, and CKD Stage 4 as evidenced by readings within limits, voiced adherence to plan of care continue to work with RN Care Manager to address  care management and care coordination needs related to  CHF, HTN, HLD, DMII, and CKD Stage 4 as evidenced by adherence to CM Team Scheduled appointments not experience hospital admission as evidenced by review of EMR. Hospital Admissions in last 6 months = 2 demonstrate a decrease in CHF, HTN, HLD, DMII, and CKD Stage 4 exacerbations as evidenced by pt reports and no hospitalizations  through collaboration with RN Care manager, provider, and care team.   Interventions: 1:1 collaboration with primary care provider regarding development and update of comprehensive plan of care as evidenced by provider attestation and co-signature Inter-disciplinary care team collaboration (see longitudinal plan of care) Evaluation of current treatment plan related to  self management and patient's adherence to plan as established by provider   Heart Failure Interventions:  (Status:  Goal on track:  Yes.) Long Term Goal Basic overview and discussion of pathophysiology of Heart Failure reviewed Provided education on low sodium diet Discussed importance of daily weight and advised patient to weigh and record daily Reviewed role of diuretics in prevention of fluid overload and management of heart failure; Discussed the importance of keeping all appointments with provider Provided patient with education about the role of exercise in the management of heart failure Reviewed to keep appointment wth Dr. Terrence Dupont. Reinforced again heart failure zones and action plan.Reinforced importance of weighting daily and when to call provider, Reinforced to watch for increased shortness of breath ,swelling in legs and abdomen to notify provider   Chronic Kidney Disease Interventions:  (Status:  Goal on track:  Yes.) Long Term Goal Assessed the Patient understanding of chronic kidney disease    Evaluation of current treatment plan related to chronic kidney disease self management and patient's adherence to plan as established by  provider      Provided education to patient re: stroke prevention, s/s of heart attack and stroke    Reviewed prescribed diet low sodium low CHO renal diet Advised patient, providing education and rationale, to monitor blood pressure daily and record, calling PCP for findings outside established parameters    Reviewed to keep appointment with nephrologist on 02/14/22 Last practice recorded BP readings:  BP Readings from Last 3 Encounters:  11/26/21 (!) 150/52  11/10/21 (!) 142/78  10/12/21 (!) 160/58  Most recent eGFR/CrCl: No results found for: EGFR  No components found for: CRCL    Diabetes Interventions:  (Status:  Goal on track:  Yes.) Long Term Goal Assessed patient's understanding of A1c goal: <7% Provided education to patient about basic DM disease process Reviewed medications with patient and discussed importance of medication adherence Discussed plans with patient for ongoing care  management follow up and provided patient with direct contact information for care management team Provided patient with written educational materials related to hypo and hyperglycemia and importance of correct treatment Reviewed scheduled/upcoming provider appointments including:  Nephrology 02/14/22, podiatry 03/28/22 Advised patient, providing education and rationale, to check cbg daily and record, calling provider for findings outside established parameters Reviewed portion sizes of fruit and to try to have protein with his snacks. Reviewed to have protein with his bedtime snack Lab Results  Component Value Date   HGBA1C 6.1 (H) 09/27/2021   Hyperlipidemia Interventions:  (Status:  Condition stable.  Not addressed this visit.) Long Term Goal Medication review performed; medication list updated in electronic medical record.  Provider established cholesterol goals reviewed Counseled on importance of regular laboratory monitoring as prescribed Reviewed role and benefits of statin for ASCVD risk  reduction Reviewed importance of limiting foods high in cholesterol  Hypertension Interventions:  (Status:  Goal on track:  Yes.) Long Term Goal Last practice recorded BP readings:  BP Readings from Last 3 Encounters:  11/26/21 (!) 150/52  11/10/21 (!) 142/78  10/12/21 (!) 160/58  Most recent eGFR/CrCl: No results found for: EGFR  No components found for: CRCL  Evaluation of current treatment plan related to hypertension self management and patient's adherence to plan as established by provider Provided education to patient re: stroke prevention, s/s of heart attack and stroke Counseled on the importance of exercise goals with target of 150 minutes per week Discussed plans with patient for ongoing care management follow up and provided patient with direct contact information for care management team Advised patient, providing education and rationale, to monitor blood pressure daily and record, calling PCP for findings outside established parameters Discussed complications of poorly controlled blood pressure such as heart disease, stroke, circulatory complications, vision complications, kidney impairment, sexual dysfunction Reinforced to try to check his B/P at home regularly  Patient Goals/Self-Care Activities: Take all medications as prescribed Attend all scheduled provider appointments Call pharmacy for medication refills 3-7 days in advance of running out of medications Call provider office for new concerns or questions  call office if I gain more than 2 pounds in one day or 5 pounds in one week keep legs up while sitting track weight in diary watch for swelling in feet, ankles and legs every day weigh myself daily follow rescue plan if symptoms flare-up eat more whole grains, fruits and vegetables, lean meats and healthy fats track symptoms and what helps feel better or worse keep appointment with eye doctor check blood sugar at prescribed times: once daily and when you have  symptoms of low or high blood sugar check feet daily for cuts, sores or redness take the blood sugar log to all doctor visits fill half of plate with vegetables manage portion size switch to sugar-free drinks keep feet up while sitting wash and dry feet carefully every day wear comfortable, cotton socks wear comfortable, well-fitting shoes check blood pressure daily choose a place to take my blood pressure (home, clinic or office, retail store) take blood pressure log to all doctor appointments call doctor for signs and symptoms of high blood pressure eat more whole grains, fruits and vegetables, lean meats and healthy fats limit salt intake to 2084m/day call for medicine refill 2 or 3 days before it runs out take all medications exactly as prescribed call doctor with any symptoms you believe are related to your medicine  Follow Up Plan:  Telephone follow up appointment with care management team  member scheduled for:  04/18/22 The patient has been provided with contact information for the care management team and has been advised to call with any health related questions or concerns.       Plan:Telephone follow up appointment with care management team member scheduled for:  04/18/22 The patient has been provided with contact information for the care management team and has been advised to call with any health related questions or concerns.  Peter Garter RN, Jackquline Denmark, CDE Care Management Coordinator [facility] [phone]

## 2022-01-24 NOTE — Patient Instructions (Signed)
Visit Information  Thank you for taking time to visit with me today. Please don't hesitate to contact me if I can be of assistance to you before our next scheduled telephone appointment.  Following are the goals we discussed today:  Take all medications as prescribed Attend all scheduled provider appointments Call pharmacy for medication refills 3-7 days in advance of running out of medications Call provider office for new concerns or questions  call office if I gain more than 2 pounds in one day or 5 pounds in one week keep legs up while sitting track weight in diary watch for swelling in feet, ankles and legs every day weigh myself daily follow rescue plan if symptoms flare-up eat more whole grains, fruits and vegetables, lean meats and healthy fats track symptoms and what helps feel better or worse keep appointment with eye doctor check blood sugar at prescribed times: once daily and when you have symptoms of low or high blood sugar check feet daily for cuts, sores or redness take the blood sugar log to all doctor visits fill half of plate with vegetables manage portion size switch to sugar-free drinks keep feet up while sitting wash and dry feet carefully every day wear comfortable, cotton socks wear comfortable, well-fitting shoes check blood pressure daily choose a place to take my blood pressure (home, clinic or office, retail store) take blood pressure log to all doctor appointments call doctor for signs and symptoms of high blood pressure eat more whole grains, fruits and vegetables, lean meats and healthy fats limit salt intake to '2000mg'$ /day call for medicine refill 2 or 3 days before it runs out take all medications exactly as prescribed call doctor with any symptoms you believe are related to your medicine  Our next appointment is by telephone on 04/18/22 at 2:15 PM  Please call the care guide team at [phone] if you need to cancel or reschedule your  appointment.   If you are experiencing a Mental Health or [place] or need someone to talk to, please call the Suicide and Crisis Lifeline: 988 call the [place] National Suicide Prevention Lifeline: (520)276-7911 or TTY: 641-635-7155 TTY (252)824-6072) to talk to a trained counselor call 1-800-273-TALK (toll free, 24 hour hotline) go to [facility] [address] [place] [phone]) call 911   The patient verbalized understanding of instructions, educational materials, and care plan provided today and agreed to receive a mailed copy of patient instructions, educational materials, and care plan.   Peter Garter RN, Jackquline [place], CDE Care Management Coordinator  [facility] [phone]

## 2022-01-26 ENCOUNTER — Telehealth: Payer: Self-pay | Admitting: Adult Health

## 2022-01-26 NOTE — Telephone Encounter (Signed)
Tried calling patient to schedule Medicare Annual Wellness Visit (AWV) either virtually or in office.   No answer  Last AWV 01/28/21 ; please schedule at anytime with [facility] Nurse Health Advisor 1 or 2

## 2022-02-07 DIAGNOSIS — N185 Chronic kidney disease, stage 5: Secondary | ICD-10-CM | POA: Diagnosis not present

## 2022-02-07 DIAGNOSIS — N189 Chronic kidney disease, unspecified: Secondary | ICD-10-CM | POA: Diagnosis not present

## 2022-02-10 ENCOUNTER — Ambulatory Visit (INDEPENDENT_AMBULATORY_CARE_PROVIDER_SITE_OTHER): Payer: Medicare PPO

## 2022-02-10 VITALS — Ht 67.0 in | Wt 175.0 lb

## 2022-02-10 DIAGNOSIS — Z0000 Encounter for general adult medical examination without abnormal findings: Secondary | ICD-10-CM | POA: Diagnosis not present

## 2022-02-10 NOTE — Patient Instructions (Signed)
[name] , Thank you for taking time to come for your Medicare Wellness Visit. I appreciate your ongoing commitment to your health goals. Please review the following plan we discussed and let me know if I can assist you in the future.   These are the goals we discussed:  Goals       Cut out extra servings      Patient stated (pt-stated)      I would like to travel more.        This is a list of the screening recommended for you and due dates:  Health Maintenance  Topic Date Due   Flu Shot  03/22/2022   Complete foot exam   03/26/2022   Hemoglobin A1C  03/27/2022   Eye exam for diabetics  10/14/2022   Tetanus Vaccine  03/08/2023   Pneumonia Vaccine  Completed   COVID-19 Vaccine  Completed   Zoster (Shingles) Vaccine  Completed   HPV Vaccine  Aged Out   Advanced directives: Yes  Conditions/risks identified: None  Next appointment: Follow up in one year for your annual wellness visit.    Preventive Care 12 Years and Older, Male Preventive care refers to lifestyle choices and visits with your health care provider that can promote health and wellness. What does preventive care include? A yearly physical exam. This is also called an annual well check. Dental exams once or twice a year. Routine eye exams. Ask your health care provider how often you should have your eyes checked. Personal lifestyle choices, including: Daily care of your teeth and gums. Regular physical activity. Eating a healthy diet. Avoiding tobacco and drug use. Limiting alcohol use. Practicing safe sex. Taking low doses of aspirin every day. Taking vitamin and mineral supplements as recommended by your health care provider. What happens during an annual well check? The services and screenings done by your health care provider during your annual well check will depend on your age, overall health, lifestyle risk factors, and family history of disease. Counseling  Your health care provider may ask you  questions about your: Alcohol use. Tobacco use. Drug use. Emotional well-being. Home and relationship well-being. Sexual activity. Eating habits. History of falls. Memory and ability to understand (cognition). Work and work Statistician. Screening  You may have the following tests or measurements: Height, weight, and BMI. Blood pressure. Lipid and cholesterol levels. These may be checked every 5 years, or more frequently if you are over 54 years old. Skin check. Lung cancer screening. You may have this screening every year starting at age 52 if you have a 30-pack-year history of smoking and currently smoke or have quit within the past 15 years. Fecal occult blood test (FOBT) of the stool. You may have this test every year starting at age 69. Flexible sigmoidoscopy or colonoscopy. You may have a sigmoidoscopy every 5 years or a colonoscopy every 10 years starting at age 49. Prostate cancer screening. Recommendations will vary depending on your family history and other risks. Hepatitis C blood test. Hepatitis B blood test. Sexually transmitted disease (STD) testing. Diabetes screening. This is done by checking your blood sugar (glucose) after you have not eaten for a while (fasting). You may have this done every 1-3 years. Abdominal aortic aneurysm (AAA) screening. You may need this if you are a current or former smoker. Osteoporosis. You may be screened starting at age 22 if you are at high risk. Talk with your health care provider about your test results, treatment options, and  if necessary, the need for more tests. Vaccines  Your health care provider may recommend certain vaccines, such as: Influenza vaccine. This is recommended every year. Tetanus, diphtheria, and acellular pertussis (Tdap, Td) vaccine. You may need a Td booster every 10 years. Zoster vaccine. You may need this after age 71. Pneumococcal 13-valent conjugate (PCV13) vaccine. One dose is recommended after age  80. Pneumococcal polysaccharide (PPSV23) vaccine. One dose is recommended after age 2. Talk to your health care provider about which screenings and vaccines you need and how often you need them. This information is not intended to replace advice given to you by your health care provider. Make sure you discuss any questions you have with your health care provider. Document Released: 09/04/2015 Document Revised: 04/27/2016 Document Reviewed: 06/09/2015 Elsevier Interactive Patient Education  2017 [place] [employer] Prevention in the Home Falls can cause injuries. They can happen to people of all ages. There are many things you can do to make your home safe and to help prevent falls. What can I do on the outside of my home? Regularly fix the edges of walkways and driveways and fix any cracks. Remove anything that might make you trip as you walk through a door, such as a raised step or threshold. Trim any bushes or trees on the path to your home. Use bright outdoor lighting. Clear any walking paths of anything that might make someone trip, such as rocks or tools. Regularly check to see if handrails are loose or broken. Make sure that both sides of any steps have handrails. Any raised decks and porches should have guardrails on the edges. Have any leaves, snow, or ice cleared regularly. Use sand or salt on walking paths during winter. Clean up any spills in your garage right away. This includes oil or grease spills. What can I do in the bathroom? Use night lights. Install grab bars by the toilet and in the tub and shower. Do not use towel bars as grab bars. Use non-skid mats or decals in the tub or shower. If you need to sit down in the shower, use a plastic, non-slip stool. Keep the floor dry. Clean up any water that spills on the floor as soon as it happens. Remove soap buildup in the tub or shower regularly. Attach bath mats securely with double-sided non-slip rug tape. Do not have throw  rugs and other things on the floor that can make you trip. What can I do in the bedroom? Use night lights. Make sure that you have a light by your bed that is easy to reach. Do not use any sheets or blankets that are too big for your bed. They should not hang down onto the floor. Have a firm chair that has side arms. You can use this for support while you get dressed. Do not have throw rugs and other things on the floor that can make you trip. What can I do in the kitchen? Clean up any spills right away. Avoid walking on wet floors. Keep items that you use a lot in easy-to-reach places. If you need to reach something above you, use a strong step stool that has a grab bar. Keep electrical cords out of the way. Do not use floor polish or wax that makes floors slippery. If you must use wax, use non-skid floor wax. Do not have throw rugs and other things on the floor that can make you trip. What can I do with my stairs? Do not leave any  items on the stairs. Make sure that there are handrails on both sides of the stairs and use them. Fix handrails that are broken or loose. Make sure that handrails are as long as the stairways. Check any carpeting to make sure that it is firmly attached to the stairs. Fix any carpet that is loose or worn. Avoid having throw rugs at the top or bottom of the stairs. If you do have throw rugs, attach them to the floor with carpet tape. Make sure that you have a light switch at the top of the stairs and the bottom of the stairs. If you do not have them, ask someone to add them for you. What else can I do to help prevent falls? Wear shoes that: Do not have high heels. Have rubber bottoms. Are comfortable and fit you well. Are closed at the toe. Do not wear sandals. If you use a stepladder: Make sure that it is fully opened. Do not climb a closed stepladder. Make sure that both sides of the stepladder are locked into place. Ask someone to hold it for you, if  possible. Clearly mark and make sure that you can see: Any grab bars or handrails. First and last steps. Where the edge of each step is. Use tools that help you move around (mobility aids) if they are needed. These include: Canes. Walkers. Scooters. Crutches. Turn on the lights when you go into a dark area. Replace any light bulbs as soon as they burn out. Set up your furniture so you have a clear path. Avoid moving your furniture around. If any of your floors are uneven, fix them. If there are any pets around you, be aware of where they are. Review your medicines with your doctor. Some medicines can make you feel dizzy. This can increase your chance of falling. Ask your doctor what other things that you can do to help prevent falls. This information is not intended to replace advice given to you by your health care provider. Make sure you discuss any questions you have with your health care provider. Document Released: 06/04/2009 Document Revised: 01/14/2016 Document Reviewed: 09/12/2014 Elsevier Interactive Patient Education  2017 [employer].

## 2022-02-14 DIAGNOSIS — N2581 Secondary hyperparathyroidism of renal origin: Secondary | ICD-10-CM | POA: Diagnosis not present

## 2022-02-14 DIAGNOSIS — I5032 Chronic diastolic (congestive) heart failure: Secondary | ICD-10-CM | POA: Diagnosis not present

## 2022-02-14 DIAGNOSIS — R918 Other nonspecific abnormal finding of lung field: Secondary | ICD-10-CM | POA: Diagnosis not present

## 2022-02-14 DIAGNOSIS — I120 Hypertensive chronic kidney disease with stage 5 chronic kidney disease or end stage renal disease: Secondary | ICD-10-CM | POA: Diagnosis not present

## 2022-02-14 DIAGNOSIS — N185 Chronic kidney disease, stage 5: Secondary | ICD-10-CM | POA: Diagnosis not present

## 2022-02-14 DIAGNOSIS — E1122 Type 2 diabetes mellitus with diabetic chronic kidney disease: Secondary | ICD-10-CM | POA: Diagnosis not present

## 2022-02-14 DIAGNOSIS — E876 Hypokalemia: Secondary | ICD-10-CM | POA: Diagnosis not present

## 2022-02-14 DIAGNOSIS — D631 Anemia in chronic kidney disease: Secondary | ICD-10-CM | POA: Diagnosis not present

## 2022-02-14 DIAGNOSIS — R809 Proteinuria, unspecified: Secondary | ICD-10-CM | POA: Diagnosis not present

## 2022-02-18 DIAGNOSIS — E785 Hyperlipidemia, unspecified: Secondary | ICD-10-CM

## 2022-02-18 DIAGNOSIS — N184 Chronic kidney disease, stage 4 (severe): Secondary | ICD-10-CM | POA: Diagnosis not present

## 2022-02-18 DIAGNOSIS — I130 Hypertensive heart and chronic kidney disease with heart failure and stage 1 through stage 4 chronic kidney disease, or unspecified chronic kidney disease: Secondary | ICD-10-CM | POA: Diagnosis not present

## 2022-02-18 DIAGNOSIS — E1122 Type 2 diabetes mellitus with diabetic chronic kidney disease: Secondary | ICD-10-CM

## 2022-02-18 DIAGNOSIS — E1159 Type 2 diabetes mellitus with other circulatory complications: Secondary | ICD-10-CM | POA: Diagnosis not present

## 2022-02-18 DIAGNOSIS — I509 Heart failure, unspecified: Secondary | ICD-10-CM

## 2022-02-23 ENCOUNTER — Other Ambulatory Visit (HOSPITAL_COMMUNITY): Payer: Self-pay

## 2022-02-25 ENCOUNTER — Encounter (HOSPITAL_COMMUNITY)
Admission: RE | Admit: 2022-02-25 | Discharge: 2022-02-25 | Disposition: A | Discharge status: Home / Self Care | Payer: Medicare PPO | Source: Ambulatory Visit | Attending: Nephrology | Admitting: Nephrology

## 2022-02-25 DIAGNOSIS — N189 Chronic kidney disease, unspecified: Secondary | ICD-10-CM | POA: Diagnosis not present

## 2022-02-25 DIAGNOSIS — D631 Anemia in chronic kidney disease: Secondary | ICD-10-CM | POA: Insufficient documentation

## 2022-02-25 MED ORDER — SODIUM CHLORIDE 0.9 % IV SOLN
200.0000 mg | INTRAVENOUS | Status: DC
  Administered 2022-02-25: 200 mg via INTRAVENOUS
  Filled 2022-02-25: qty 10

## 2022-03-04 ENCOUNTER — Encounter (HOSPITAL_COMMUNITY)
Admission: RE | Admit: 2022-03-04 | Discharge: 2022-03-04 | Disposition: A | Discharge status: Home / Self Care | Payer: Medicare PPO | Source: Ambulatory Visit | Attending: Nephrology | Admitting: Nephrology

## 2022-03-04 DIAGNOSIS — N189 Chronic kidney disease, unspecified: Secondary | ICD-10-CM | POA: Diagnosis not present

## 2022-03-04 DIAGNOSIS — D631 Anemia in chronic kidney disease: Secondary | ICD-10-CM | POA: Diagnosis not present

## 2022-03-04 MED ORDER — SODIUM CHLORIDE 0.9 % IV SOLN
200.0000 mg | INTRAVENOUS | Status: AC
  Administered 2022-03-04: 200 mg via INTRAVENOUS
  Filled 2022-03-04: qty 200

## 2022-03-07 ENCOUNTER — Other Ambulatory Visit: Payer: Self-pay | Admitting: Adult Health

## 2022-03-11 DIAGNOSIS — N185 Chronic kidney disease, stage 5: Secondary | ICD-10-CM | POA: Diagnosis not present

## 2022-03-14 ENCOUNTER — Other Ambulatory Visit: Payer: Self-pay | Admitting: Adult Health

## 2022-03-14 DIAGNOSIS — K5900 Constipation, unspecified: Secondary | ICD-10-CM

## 2022-03-15 NOTE — Telephone Encounter (Signed)
Hard stop for Rx refill stating that it may interfere with clopidogrel and platelets. Ok to refill?

## 2022-03-18 ENCOUNTER — Ambulatory Visit: Payer: Medicare PPO

## 2022-03-18 DIAGNOSIS — I120 Hypertensive chronic kidney disease with stage 5 chronic kidney disease or end stage renal disease: Secondary | ICD-10-CM | POA: Diagnosis not present

## 2022-03-18 DIAGNOSIS — I5033 Acute on chronic diastolic (congestive) heart failure: Secondary | ICD-10-CM

## 2022-03-18 DIAGNOSIS — I5032 Chronic diastolic (congestive) heart failure: Secondary | ICD-10-CM | POA: Diagnosis not present

## 2022-03-18 DIAGNOSIS — R918 Other nonspecific abnormal finding of lung field: Secondary | ICD-10-CM | POA: Diagnosis not present

## 2022-03-18 DIAGNOSIS — E1122 Type 2 diabetes mellitus with diabetic chronic kidney disease: Secondary | ICD-10-CM | POA: Diagnosis not present

## 2022-03-18 DIAGNOSIS — R809 Proteinuria, unspecified: Secondary | ICD-10-CM | POA: Diagnosis not present

## 2022-03-18 DIAGNOSIS — N2581 Secondary hyperparathyroidism of renal origin: Secondary | ICD-10-CM | POA: Diagnosis not present

## 2022-03-18 DIAGNOSIS — N185 Chronic kidney disease, stage 5: Secondary | ICD-10-CM | POA: Diagnosis not present

## 2022-03-18 DIAGNOSIS — E876 Hypokalemia: Secondary | ICD-10-CM | POA: Diagnosis not present

## 2022-03-18 DIAGNOSIS — N184 Chronic kidney disease, stage 4 (severe): Secondary | ICD-10-CM

## 2022-03-18 DIAGNOSIS — D631 Anemia in chronic kidney disease: Secondary | ICD-10-CM | POA: Diagnosis not present

## 2022-03-18 DIAGNOSIS — I10 Essential (primary) hypertension: Secondary | ICD-10-CM

## 2022-03-18 NOTE — Patient Instructions (Signed)
Visit Information Case closed goals met Thank you for allowing me to share the care management and care coordination services that are available to you as part of your health plan and services through your primary care provider and medical home. Please reach out to me at [phone] if the care management/care coordination team may be of assistance to you in the future.   Morrill Bomkamp RN, BSN,CCM, CDE Care Management Coordinator [facility] [phone]   

## 2022-03-18 NOTE — Chronic Care Management (AMB) (Signed)
Chronic Care Management   CCM RN Visit Note  03/18/2022 Name: Austin [name] MRN: [id number] DOB: 06/26/1935  Subjective: Austin [name] is a 87 y.o. year old male who is a primary care patient of Dorothyann Peng, NP. The care management team was consulted for assistance with disease management and care coordination needs.    Engaged with patient by telephone for follow up visit in response to provider referral for case management and/or care coordination services.   Consent to Services:  The patient was given information about Chronic Care Management services, agreed to services, and gave verbal consent prior to initiation of services.  Please see initial visit note for detailed documentation.   Patient agreed to services and verbal consent obtained.   Assessment: Review of patient past medical history, allergies, medications, health status, including review of consultants reports, laboratory and other test data, was performed as part of comprehensive evaluation and provision of chronic care management services.   SDOH (Social Determinants of Health) assessments and interventions performed:    CCM Care Plan  Allergies  Allergen Reactions   Aspirin Other (See Comments)    High doses causes stomach ulcer and bleeding    Outpatient Encounter Medications as of 03/18/2022  Medication Sig   ACCU-CHEK GUIDE test strip USE TO CHECK BLOOD GLUCOSE TWICE A DAY OR AS NEEDED   Accu-Chek Softclix Lancets lancets USED TO CHECK BLOOD GLUCOSE TWICE A DAY OR AS NEEDED   acetaminophen (TYLENOL) 325 MG tablet Take 325 mg by mouth 4 (four) times daily.   albuterol (VENTOLIN HFA) 108 (90 Base) MCG/ACT inhaler TAKE 2 PUFFS BY MOUTH EVERY 6 HOURS AS NEEDED FOR WHEEZE OR SHORTNESS OF BREATH (Patient taking differently: Inhale 2 puffs into the lungs every 6 (six) hours as needed for shortness of breath or wheezing.)   amLODipine (NORVASC) 10 MG tablet TAKE 1 TABLET BY MOUTH EVERY DAY   atorvastatin (LIPITOR) 20 MG  tablet TAKE 1 TABLET BY MOUTH EVERY DAY   bisacodyl (DULCOLAX) 10 MG suppository Place 1 suppository (10 mg total) rectally daily as needed for mild constipation. (Patient not taking: Reported on 11/11/2021)   Blood Glucose Monitoring Suppl (ACCU-CHEK AVIVA PLUS) w/Device KIT Used to check blood glucose 2 times a day or PRN (Patient taking differently: in the morning and at bedtime.)   calcitRIOL (ROCALTROL) 0.25 MCG capsule Take 0.25 mcg by mouth 3 (three) times a week.   cholecalciferol (VITAMIN D3) 25 MCG (1000 UNIT) tablet Take 1,000 Units by mouth daily.   cloNIDine (CATAPRES) 0.3 MG tablet Take 1 tablet (0.3 mg total) by mouth 3 (three) times daily.   clopidogrel (PLAVIX) 75 MG tablet TAKE 1 TABLET BY MOUTH EVERY DAY (Patient taking differently: Take 75 mg by mouth daily.)   doxazosin (CARDURA) 2 MG tablet Take 2 mg by mouth at bedtime.    esomeprazole (NEXIUM) 40 MG capsule TAKE 1 CAPSULE BY MOUTH EVERY DAY   eszopiclone (LUNESTA) 1 MG TABS tablet Take 1 tablet (1 mg total) by mouth at bedtime as needed for sleep. Take immediately before bedtime   glipiZIDE (GLUCOTROL XL) 5 MG 24 hr tablet Take by mouth. (Patient not taking: Reported on 01/24/2022)   hydrocortisone (ANUSOL-HC) 2.5 % rectal cream Place 1 application rectally 2 (two) times daily.   isosorbide-hydrALAZINE (BIDIL) 20-37.5 MG tablet Take 1 tablet by mouth 3 (three) times daily.   KLOR-CON M10 10 MEQ tablet Take by mouth.   lactulose (CHRONULAC) 10 GM/15ML solution TAKE 15 ML BY MOUTH 2 TIMES  DAILY AS NEEDED FOR MILD CONSTIPATION.   methylcellulose (CITRUCEL) oral powder Take 1 packet by mouth See admin instructions. Every 2-3 days,alternating with lactulose   metoprolol succinate (TOPROL-XL) 100 MG 24 hr tablet Take 1 tablet (100 mg total) by mouth daily. Take with or immediately following a meal.   potassium chloride SA (KLOR-CON M) 20 MEQ tablet Take 1 tablet (20 mEq total) by mouth 2 (two) times daily.   Propylene Glycol (SYSTANE  BALANCE OP) Place 1 drop into both eyes at bedtime.   torsemide (DEMADEX) 20 MG tablet Take 2 tablets (40 mg total) by mouth 2 (two) times daily.   triamcinolone cream (KENALOG) 0.1 % Apply 1 application topically daily as needed (dry/irritated skin).   Facility-Administered Encounter Medications as of 03/18/2022  Medication   ammonium lactate (LAC-HYDRIN) 12 % lotion    Patient Active Problem List   Diagnosis Date Noted   UTI (urinary tract infection) 09/27/2021   CHF exacerbation ([facility]) 09/26/2021   Acute respiratory failure with hypoxia ([facility]) 09/26/2021   Acute on chronic diastolic CHF (congestive heart failure) ([facility]) 07/09/2021   Dyspnea 06/01/2021   Upper airway cough syndrome 04/06/2021   Porokeratosis 03/26/2021   Pulmonary nodule 1 cm or greater in diameter 10/22/2020   Acute on chronic renal failure ([facility]) 10/09/2020   Mass of right lung 09/18/2020   Acute kidney injury superimposed on CKD ([facility]) 08/04/2019   Elevated troponin 08/04/2019   Hypokalemia 08/04/2019   Hypertensive urgency 03/28/2019   Elevated serum immunoglobulin free light chains 02/20/2019   Gastrointestinal hemorrhage with melena    Melena 11/03/2016   Carotid artery stenosis 05/10/2010   Type 2 diabetes mellitus with hyperlipidemia ([facility]) 09/19/2007   Iron deficiency anemia due to chronic blood loss 03/13/2007   GERD 03/13/2007   Essential hypertension 03/05/2007   DISEASE, CEREBROVASCULAR NEC 03/05/2007   PROSTATE CANCER, HX OF 03/05/2007    Conditions to be addressed/monitored:CHF, HTN, DMII, and CKD Stage 4  Care Plan : RN Care Manager Plan of Care  Updates made by Dimitri Ped, RN since 03/18/2022 12:00 AM  Completed 03/18/2022   Problem: Chronic Disease Management and Care Coordination Needs (CHF, CKD4, DM,HTN and HLD) Resolved 03/18/2022  Priority: High     Long-Range Goal: Establish Plan of Care for Chronic Disease Management Needs (CHF, CKD4, DM,HTN and HLD) Completed 03/18/2022  Start  Date: 09/02/2021  Expected End Date: 08/30/2022  Priority: High  Note:   Case closed goals met Current Barriers:  Knowledge Deficits related to plan of care for management of CHF, HTN, HLD, DMII, and CKD Stage 4  Chronic Disease Management support and education needs related to CHF, HTN, HLD, DMII, and CKD Stage 4  States he has been feeling good. States his kidneys have been improving and he has not needed to go on dialysis.  States he  saw the nephrologist today States his kidney function is stable.  States he is trying to walk around [place] for exercise.  Denies any chest pains, shortness of breath or increased swelling.  States he is trying to weigh every day and he weighted 171 this morning.   States his B/P was last checked at his doctors visits.  States he has not been checking his B/P everyday. States his CBG have ranged 122-140 with no low readings. States he is trying to follow healthy diet and he can no longer have diary products due to his kidneys RNCM Clinical Goal(s):  Patient will verbalize understanding of plan for management  of CHF, HTN, HLD, DMII, and CKD Stage 4 as evidenced by voiced adherence to plan of care verbalize basic understanding of  CHF, HTN, HLD, DMII, and CKD Stage 4 disease process and self health management plan as evidenced by voiced understanding and teach back take all medications exactly as prescribed and will call provider for medication related questions as evidenced by dispense report and pt verbalization attend all scheduled medical appointments: Nephrology 03/18/22, podiatry 03/28/22  as evidenced by medical records demonstrate Improved adherence to prescribed treatment plan for CHF, HTN, HLD, DMII, and CKD Stage 4 as evidenced by readings within limits, voiced adherence to plan of care continue to work with RN Care Manager to address care management and care coordination needs related to  CHF, HTN, HLD, DMII, and CKD Stage 4 as evidenced by adherence to CM Team  Scheduled appointments not experience hospital admission as evidenced by review of EMR. Hospital Admissions in last 6 months = 2 demonstrate a decrease in CHF, HTN, HLD, DMII, and CKD Stage 4 exacerbations as evidenced by pt reports and no hospitalizations  through collaboration with RN Care manager, provider, and care team.   Interventions: 1:1 collaboration with primary care provider regarding development and update of comprehensive plan of care as evidenced by provider attestation and co-signature Inter-disciplinary care team collaboration (see longitudinal plan of care) Evaluation of current treatment plan related to  self management and patient's adherence to plan as established by provider   Heart Failure Interventions:  (Status:  Goal Met.) Long Term Goal Basic overview and discussion of pathophysiology of Heart Failure reviewed Provided education on low sodium diet Discussed importance of daily weight and advised patient to weigh and record daily Reviewed role of diuretics in prevention of fluid overload and management of heart failure; Discussed the importance of keeping all appointments with provider Provided patient with education about the role of exercise in the management of heart failure Reinforced to keep appointment wth Dr. Terrence Dupont. Reinforced again heart failure zones and action plan.Reinforced importance of weighting daily and when to call provider, Reinforced to watch for increased shortness of breath ,swelling in legs and abdomen to notify provider   Chronic Kidney Disease Interventions:  (Status:  Goal Met.) Long Term Goal Assessed the Patient understanding of chronic kidney disease    Evaluation of current treatment plan related to chronic kidney disease self management and patient's adherence to plan as established by provider      Provided education to patient re: stroke prevention, s/s of heart attack and stroke    Reviewed prescribed diet low sodium low CHO renal  diet Advised patient, providing education and rationale, to monitor blood pressure daily and record, calling PCP for findings outside established parameters    Reinforced to keep appointment with nephrologist  Last practice recorded BP readings:  BP Readings from Last 3 Encounters:  03/04/22 (!) 146/46  02/25/22 (!) 144/56  11/26/21 (!) 150/52  Most recent eGFR/CrCl: No results found for: EGFR  No components found for: CRCL    Diabetes Interventions:  (Status:  Goal Met.) Long Term Goal Assessed patient's understanding of A1c goal: <7% Provided education to patient about basic DM disease process Reviewed medications with patient and discussed importance of medication adherence Discussed plans with patient for ongoing care management follow up and provided patient with direct contact information for care management team Provided patient with written educational materials related to hypo and hyperglycemia and importance of correct treatment Reviewed scheduled/upcoming provider appointments including:  Nephrology 02/14/22,  podiatry 03/28/22 Advised patient, providing education and rationale, to check cbg daily and record, calling provider for findings outside established parameters Reinforced portion sizes of fruit and to try to have protein with his snacks. Reviewed to have protein with his bedtime snack Lab Results  Component Value Date   HGBA1C 6.1 (H) 09/27/2021   Hyperlipidemia Interventions:  (Status:  Goal Met.) Long Term Goal Medication review performed; medication list updated in electronic medical record.  Provider established cholesterol goals reviewed Counseled on importance of regular laboratory monitoring as prescribed Reviewed role and benefits of statin for ASCVD risk reduction Reviewed importance of limiting foods high in cholesterol  Hypertension Interventions:  (Status:  Goal Met.) Long Term Goal Last practice recorded BP readings:  BP Readings from Last 3 Encounters:   03/04/22 (!) 146/46  02/25/22 (!) 144/56  11/26/21 (!) 150/52  Most recent eGFR/CrCl: No results found for: EGFR  No components found for: CRCL  Evaluation of current treatment plan related to hypertension self management and patient's adherence to plan as established by provider Provided education to patient re: stroke prevention, s/s of heart attack and stroke Counseled on the importance of exercise goals with target of 150 minutes per week Discussed plans with patient for ongoing care management follow up and provided patient with direct contact information for care management team Advised patient, providing education and rationale, to monitor blood pressure daily and record, calling PCP for findings outside established parameters Discussed complications of poorly controlled blood pressure such as heart disease, stroke, circulatory complications, vision complications, kidney impairment, sexual dysfunction Reinforced to try to check his B/P at home regularly  Patient Goals/Self-Care Activities: Take all medications as prescribed Attend all scheduled provider appointments Call pharmacy for medication refills 3-7 days in advance of running out of medications Call provider office for new concerns or questions  call office if I gain more than 2 pounds in one day or 5 pounds in one week keep legs up while sitting track weight in diary watch for swelling in feet, ankles and legs every day weigh myself daily follow rescue plan if symptoms flare-up eat more whole grains, fruits and vegetables, lean meats and healthy fats track symptoms and what helps feel better or worse keep appointment with eye doctor check blood sugar at prescribed times: once daily and when you have symptoms of low or high blood sugar check feet daily for cuts, sores or redness take the blood sugar log to all doctor visits fill half of plate with vegetables manage portion size switch to sugar-free drinks keep feet up  while sitting wash and dry feet carefully every day wear comfortable, cotton socks wear comfortable, well-fitting shoes check blood pressure daily choose a place to take my blood pressure (home, clinic or office, retail store) take blood pressure log to all doctor appointments call doctor for signs and symptoms of high blood pressure eat more whole grains, fruits and vegetables, lean meats and healthy fats limit salt intake to 208m/day call for medicine refill 2 or 3 days before it runs out take all medications exactly as prescribed call doctor with any symptoms you believe are related to your medicine  Follow Up Plan:  The patient has been provided with contact information for the care management team and has been advised to call with any health related questions or concerns.  No further follow up required: Case closed goals met       Plan:The patient has been provided with contact information for the care management  team and has been advised to call with any health related questions or concerns.  No further follow up required: Case closed goals met Peter Garter RN, [facility], CDE Care Management Coordinator [facility] [phone]

## 2022-03-28 ENCOUNTER — Ambulatory Visit: Payer: Medicare PPO | Admitting: Podiatry

## 2022-03-28 DIAGNOSIS — M79674 Pain in right toe(s): Secondary | ICD-10-CM

## 2022-03-28 DIAGNOSIS — M79675 Pain in left toe(s): Secondary | ICD-10-CM | POA: Diagnosis not present

## 2022-03-28 DIAGNOSIS — E0821 Diabetes mellitus due to underlying condition with diabetic nephropathy: Secondary | ICD-10-CM

## 2022-03-28 DIAGNOSIS — Q828 Other specified congenital malformations of skin: Secondary | ICD-10-CM

## 2022-03-28 DIAGNOSIS — B351 Tinea unguium: Secondary | ICD-10-CM

## 2022-03-28 NOTE — Progress Notes (Signed)
This patient returns to my office for at risk foot care.  This patient requires this care by a professional since this patient will be at risk due to having diabetic neuropathy and CKD   This patient is unable to cut nails himself since the patient cannot reach his nails.These nails are painful walking and wearing shoes.  This patient presents for at risk foot care today.  General Appearance  Alert, conversant and in no acute stress.  Vascular  Dorsalis pedis and posterior tibial  pulses are palpable  bilaterally.  Capillary return is within normal limits  bilaterally. Temperature is within normal limits  bilaterally.  Neurologic  Senn-Weinstein monofilament wire test diminished bilaterally. Muscle power within normal limits bilaterally.  Nails Thick disfigured discolored nails with subungual debris  from hallux to fifth toes bilaterally. No evidence of bacterial infection or drainage bilaterally.  Orthopedic  No limitations of motion  feet .  No crepitus or effusions noted.  No bony pathology or digital deformities noted.  HAV  B/L  Hammer toes  B/L.  Skin  normotropic skin noted bilaterally.  No signs of infections or ulcers noted.   Porokeratosis sub  3,4 and 5 left foot.  Onychomycosis  Pain in right toes  Pain in left toes  Porokeratosis left foot.  Consent was obtained for treatment procedures.   Mechanical debridement of nails 1-5  bilaterally performed with a nail nipper.  Filed with dremel without incident. Debride porokeratosis with # 15 blade and dremel tool.   Return office visit    9 weeks                 Told patient to return for periodic foot care and evaluation due to potential at risk complications.   Arvo Ealy DPM   

## 2022-04-13 DIAGNOSIS — N185 Chronic kidney disease, stage 5: Secondary | ICD-10-CM | POA: Diagnosis not present

## 2022-04-15 DIAGNOSIS — I679 Cerebrovascular disease, unspecified: Secondary | ICD-10-CM | POA: Diagnosis not present

## 2022-04-15 DIAGNOSIS — I739 Peripheral vascular disease, unspecified: Secondary | ICD-10-CM | POA: Diagnosis not present

## 2022-04-15 DIAGNOSIS — I350 Nonrheumatic aortic (valve) stenosis: Secondary | ICD-10-CM | POA: Diagnosis not present

## 2022-04-15 DIAGNOSIS — I351 Nonrheumatic aortic (valve) insufficiency: Secondary | ICD-10-CM | POA: Diagnosis not present

## 2022-04-18 ENCOUNTER — Telehealth: Payer: Medicare PPO

## 2022-04-22 DIAGNOSIS — E1122 Type 2 diabetes mellitus with diabetic chronic kidney disease: Secondary | ICD-10-CM | POA: Diagnosis not present

## 2022-04-22 DIAGNOSIS — D631 Anemia in chronic kidney disease: Secondary | ICD-10-CM | POA: Diagnosis not present

## 2022-04-22 DIAGNOSIS — N185 Chronic kidney disease, stage 5: Secondary | ICD-10-CM | POA: Diagnosis not present

## 2022-04-22 DIAGNOSIS — R809 Proteinuria, unspecified: Secondary | ICD-10-CM | POA: Diagnosis not present

## 2022-04-22 DIAGNOSIS — I130 Hypertensive heart and chronic kidney disease with heart failure and stage 1 through stage 4 chronic kidney disease, or unspecified chronic kidney disease: Secondary | ICD-10-CM | POA: Diagnosis not present

## 2022-04-22 DIAGNOSIS — N2581 Secondary hyperparathyroidism of renal origin: Secondary | ICD-10-CM | POA: Diagnosis not present

## 2022-04-22 DIAGNOSIS — E876 Hypokalemia: Secondary | ICD-10-CM | POA: Diagnosis not present

## 2022-04-22 DIAGNOSIS — R918 Other nonspecific abnormal finding of lung field: Secondary | ICD-10-CM | POA: Diagnosis not present

## 2022-04-22 DIAGNOSIS — I5032 Chronic diastolic (congestive) heart failure: Secondary | ICD-10-CM | POA: Diagnosis not present

## 2022-05-04 ENCOUNTER — Other Ambulatory Visit (HOSPITAL_COMMUNITY): Payer: Self-pay | Admitting: *Deleted

## 2022-05-05 ENCOUNTER — Ambulatory Visit (HOSPITAL_COMMUNITY)
Admission: RE | Admit: 2022-05-05 | Discharge: 2022-05-05 | Disposition: A | Discharge status: Home / Self Care | Payer: Medicare PPO | Source: Ambulatory Visit | Attending: Nephrology | Admitting: Nephrology

## 2022-05-05 DIAGNOSIS — R911 Solitary pulmonary nodule: Secondary | ICD-10-CM | POA: Insufficient documentation

## 2022-05-05 MED ORDER — SODIUM CHLORIDE 0.9 % IV SOLN
200.0000 mg | INTRAVENOUS | Status: DC
  Administered 2022-05-05: 200 mg via INTRAVENOUS
  Filled 2022-05-05: qty 200

## 2022-05-06 ENCOUNTER — Ambulatory Visit (HOSPITAL_COMMUNITY): Payer: Medicare PPO

## 2022-05-11 ENCOUNTER — Ambulatory Visit: Payer: Medicare PPO | Admitting: Emergency Medicine

## 2022-05-12 ENCOUNTER — Encounter (HOSPITAL_COMMUNITY)
Admission: RE | Admit: 2022-05-12 | Discharge: 2022-05-12 | Disposition: A | Discharge status: Home / Self Care | Payer: Medicare PPO | Source: Ambulatory Visit | Attending: Nephrology | Admitting: Nephrology

## 2022-05-12 DIAGNOSIS — N189 Chronic kidney disease, unspecified: Secondary | ICD-10-CM | POA: Diagnosis not present

## 2022-05-12 DIAGNOSIS — D631 Anemia in chronic kidney disease: Secondary | ICD-10-CM | POA: Insufficient documentation

## 2022-05-12 MED ORDER — SODIUM CHLORIDE 0.9 % IV SOLN
200.0000 mg | INTRAVENOUS | Status: DC
  Administered 2022-05-12: 200 mg via INTRAVENOUS
  Filled 2022-05-12: qty 200

## 2022-05-19 ENCOUNTER — Encounter (HOSPITAL_COMMUNITY)
Admission: RE | Admit: 2022-05-19 | Discharge: 2022-05-19 | Disposition: A | Discharge status: Home / Self Care | Payer: Medicare PPO | Source: Ambulatory Visit | Attending: Nephrology | Admitting: Nephrology

## 2022-05-19 DIAGNOSIS — D631 Anemia in chronic kidney disease: Secondary | ICD-10-CM | POA: Diagnosis not present

## 2022-05-19 DIAGNOSIS — N189 Chronic kidney disease, unspecified: Secondary | ICD-10-CM | POA: Diagnosis not present

## 2022-05-19 MED ORDER — SODIUM CHLORIDE 0.9 % IV SOLN
200.0000 mg | INTRAVENOUS | Status: DC
  Administered 2022-05-19: 200 mg via INTRAVENOUS
  Filled 2022-05-19: qty 200

## 2022-05-23 DIAGNOSIS — I130 Hypertensive heart and chronic kidney disease with heart failure and stage 1 through stage 4 chronic kidney disease, or unspecified chronic kidney disease: Secondary | ICD-10-CM | POA: Diagnosis not present

## 2022-05-23 DIAGNOSIS — E876 Hypokalemia: Secondary | ICD-10-CM | POA: Diagnosis not present

## 2022-05-23 DIAGNOSIS — R809 Proteinuria, unspecified: Secondary | ICD-10-CM | POA: Diagnosis not present

## 2022-05-23 DIAGNOSIS — E1122 Type 2 diabetes mellitus with diabetic chronic kidney disease: Secondary | ICD-10-CM | POA: Diagnosis not present

## 2022-05-23 DIAGNOSIS — N185 Chronic kidney disease, stage 5: Secondary | ICD-10-CM | POA: Diagnosis not present

## 2022-05-23 DIAGNOSIS — R918 Other nonspecific abnormal finding of lung field: Secondary | ICD-10-CM | POA: Diagnosis not present

## 2022-05-23 DIAGNOSIS — D631 Anemia in chronic kidney disease: Secondary | ICD-10-CM | POA: Diagnosis not present

## 2022-05-23 DIAGNOSIS — N2581 Secondary hyperparathyroidism of renal origin: Secondary | ICD-10-CM | POA: Diagnosis not present

## 2022-05-23 DIAGNOSIS — I5032 Chronic diastolic (congestive) heart failure: Secondary | ICD-10-CM | POA: Diagnosis not present

## 2022-05-25 ENCOUNTER — Other Ambulatory Visit: Payer: Self-pay | Admitting: Adult Health

## 2022-06-06 ENCOUNTER — Ambulatory Visit: Payer: Medicare PPO | Admitting: Podiatry

## 2022-06-15 ENCOUNTER — Other Ambulatory Visit: Payer: Self-pay | Admitting: Adult Health

## 2022-06-23 DIAGNOSIS — E876 Hypokalemia: Secondary | ICD-10-CM | POA: Diagnosis not present

## 2022-06-23 DIAGNOSIS — R918 Other nonspecific abnormal finding of lung field: Secondary | ICD-10-CM | POA: Diagnosis not present

## 2022-06-23 DIAGNOSIS — N185 Chronic kidney disease, stage 5: Secondary | ICD-10-CM | POA: Diagnosis not present

## 2022-06-23 DIAGNOSIS — N189 Chronic kidney disease, unspecified: Secondary | ICD-10-CM | POA: Diagnosis not present

## 2022-06-23 DIAGNOSIS — R809 Proteinuria, unspecified: Secondary | ICD-10-CM | POA: Diagnosis not present

## 2022-06-23 DIAGNOSIS — D631 Anemia in chronic kidney disease: Secondary | ICD-10-CM | POA: Diagnosis not present

## 2022-06-23 DIAGNOSIS — E1122 Type 2 diabetes mellitus with diabetic chronic kidney disease: Secondary | ICD-10-CM | POA: Diagnosis not present

## 2022-06-23 DIAGNOSIS — N2581 Secondary hyperparathyroidism of renal origin: Secondary | ICD-10-CM | POA: Diagnosis not present

## 2022-06-23 DIAGNOSIS — I129 Hypertensive chronic kidney disease with stage 1 through stage 4 chronic kidney disease, or unspecified chronic kidney disease: Secondary | ICD-10-CM | POA: Diagnosis not present

## 2022-07-04 ENCOUNTER — Telehealth: Payer: Self-pay | Admitting: Adult Health

## 2022-07-04 NOTE — Telephone Encounter (Signed)
Collie Siad with [facility] called because they need to extract a tooth and Pt is on blood thinners. They are requesting a clearance letter, before they can schedule Pt.  FAX:  [phone]  CB: [phone]

## 2022-07-05 ENCOUNTER — Encounter: Payer: Self-pay | Admitting: Adult Health

## 2022-07-05 NOTE — Telephone Encounter (Signed)
[facility] for clearance letter?

## 2022-07-05 NOTE — Telephone Encounter (Signed)
Collie Siad verbalized that fax was received.

## 2022-07-05 NOTE — Telephone Encounter (Signed)
Letter faxed.

## 2022-07-08 DIAGNOSIS — I350 Nonrheumatic aortic (valve) stenosis: Secondary | ICD-10-CM | POA: Diagnosis not present

## 2022-07-08 DIAGNOSIS — I5032 Chronic diastolic (congestive) heart failure: Secondary | ICD-10-CM | POA: Diagnosis not present

## 2022-07-08 DIAGNOSIS — I351 Nonrheumatic aortic (valve) insufficiency: Secondary | ICD-10-CM | POA: Diagnosis not present

## 2022-07-08 DIAGNOSIS — R001 Bradycardia, unspecified: Secondary | ICD-10-CM | POA: Diagnosis not present

## 2022-07-10 ENCOUNTER — Other Ambulatory Visit: Payer: Self-pay | Admitting: Adult Health

## 2022-07-18 DIAGNOSIS — H353231 Exudative age-related macular degeneration, bilateral, with active choroidal neovascularization: Secondary | ICD-10-CM | POA: Diagnosis not present

## 2022-07-18 DIAGNOSIS — H43813 Vitreous degeneration, bilateral: Secondary | ICD-10-CM | POA: Diagnosis not present

## 2022-07-18 DIAGNOSIS — H43822 Vitreomacular adhesion, left eye: Secondary | ICD-10-CM | POA: Diagnosis not present

## 2022-07-18 DIAGNOSIS — E113212 Type 2 diabetes mellitus with mild nonproliferative diabetic retinopathy with macular edema, left eye: Secondary | ICD-10-CM | POA: Diagnosis not present

## 2022-07-18 DIAGNOSIS — E113291 Type 2 diabetes mellitus with mild nonproliferative diabetic retinopathy without macular edema, right eye: Secondary | ICD-10-CM | POA: Diagnosis not present

## 2022-07-19 ENCOUNTER — Ambulatory Visit: Payer: Medicare PPO | Admitting: Podiatry

## 2022-07-19 ENCOUNTER — Encounter: Payer: Self-pay | Admitting: Podiatry

## 2022-07-19 DIAGNOSIS — M79674 Pain in right toe(s): Secondary | ICD-10-CM | POA: Diagnosis not present

## 2022-07-19 DIAGNOSIS — B351 Tinea unguium: Secondary | ICD-10-CM

## 2022-07-19 DIAGNOSIS — Q828 Other specified congenital malformations of skin: Secondary | ICD-10-CM | POA: Diagnosis not present

## 2022-07-19 DIAGNOSIS — M792 Neuralgia and neuritis, unspecified: Secondary | ICD-10-CM

## 2022-07-19 DIAGNOSIS — M79675 Pain in left toe(s): Secondary | ICD-10-CM

## 2022-07-19 DIAGNOSIS — E0821 Diabetes mellitus due to underlying condition with diabetic nephropathy: Secondary | ICD-10-CM

## 2022-07-19 NOTE — Progress Notes (Signed)
This patient returns to my office for at risk foot care.  This patient requires this care by a professional since this patient will be at risk due to having diabetic neuropathy and CKD   This patient is unable to cut nails himself since the patient cannot reach his nails.These nails are painful walking and wearing shoes.  This patient presents for at risk foot care today.  General Appearance  Alert, conversant and in no acute stress.  Vascular  Dorsalis pedis and posterior tibial  pulses are palpable  bilaterally.  Capillary return is within normal limits  bilaterally. Temperature is within normal limits  bilaterally.  Neurologic  Senn-Weinstein monofilament wire test diminished bilaterally. Muscle power within normal limits bilaterally.  Nails Thick disfigured discolored nails with subungual debris  from hallux to fifth toes bilaterally. No evidence of bacterial infection or drainage bilaterally.  Orthopedic  No limitations of motion  feet .  No crepitus or effusions noted.  No bony pathology or digital deformities noted.  HAV  B/L  Hammer toes  B/L.  Skin  normotropic skin noted bilaterally.  No signs of infections or ulcers noted.   Porokeratosis sub  3,4 and 5 left foot.  Onychomycosis  Pain in right toes  Pain in left toes  Porokeratosis left foot.  Consent was obtained for treatment procedures.   Mechanical debridement of nails 1-5  bilaterally performed with a nail nipper.  Filed with dremel without incident. Debride porokeratosis with # 15 blade and dremel tool.   Return office visit    9 weeks                 Told patient to return for periodic foot care and evaluation due to potential at risk complications.   Gardiner Barefoot DPM

## 2022-07-22 ENCOUNTER — Other Ambulatory Visit: Payer: Self-pay | Admitting: Adult Health

## 2022-07-22 ENCOUNTER — Encounter: Payer: Self-pay | Admitting: *Deleted

## 2022-07-22 ENCOUNTER — Telehealth: Payer: Self-pay | Admitting: *Deleted

## 2022-07-22 DIAGNOSIS — I5032 Chronic diastolic (congestive) heart failure: Secondary | ICD-10-CM

## 2022-07-22 DIAGNOSIS — K5900 Constipation, unspecified: Secondary | ICD-10-CM

## 2022-07-22 NOTE — Patient Instructions (Signed)
Visit Information  Thank you for taking time to visit with me today. Please don't hesitate to contact me if I can be of assistance to you.   Following are the goals we discussed today:   Goals Addressed               This Visit's Progress     Transportation/CHF Management (pt-stated)        Care Coordination Interventions: Basic overview and discussion of pathophysiology of Heart Failure reviewed Provided education on low sodium diet Reviewed Heart Failure Action Plan in depth and provided written copy Assessed need for readable accurate scales in home Provided education about placing scale on hard, flat surface Advised patient to weigh each morning after emptying bladder Discussed importance of daily weight and advised patient to weigh and record daily Reviewed role of diuretics in prevention of fluid overload and management of heart failure; Discussed the importance of keeping all appointments with provider Provided patient with education about the role of exercise in the management of heart failure Referral made to community resources care guide team for assistance with Transportation (SCATS application) and any other available resources; Screening for signs and symptoms of depression related to chronic disease state  Assessed social determinant of health barriers  Will mail CHF education and the HF zones on what to do if acute symptoms occur. Will include in the mail out how to reach this RN case manager for ongoing case management needs.          Our next appointment is by telephone on 08/26/2022 at 1100  Please call the care guide team at [phone] if you need to cancel or reschedule your appointment.   If you are experiencing a Mental Health or [place] or need someone to talk to, please call the Suicide and Crisis Lifeline: 988 call the [place] National Suicide Prevention Lifeline: (385) 176-1128 or TTY: 918-625-8828 TTY 317-480-9711) to talk to a  trained counselor call 1-800-273-TALK (toll free, 24 hour hotline)  The patient verbalized understanding of instructions, educational materials, and care plan provided today and agreed to receive a mailed copy of patient instructions, educational materials, and care plan.   The patient has been provided with contact information for the care management team and has been advised to call with any health related questions or concerns.   Raina Mina, RN Care Management Coordinator [place] Office [phone]

## 2022-07-22 NOTE — Patient Outreach (Signed)
  Care Coordination   Initial Visit Note   07/22/2022 Name: Austin [name] MRN: [id number] DOB: 1935-02-18  [name] is a 87 y.o. year old male who sees Nafziger, Tommi Rumps, NP for primary care. I spoke with  Norm Salt by phone today.  What matters to the patients health and wellness today?  Transportation and HF management    Goals Addressed               This Visit's Progress     Transportation/CHF Management (pt-stated)        Care Coordination Interventions: Basic overview and discussion of pathophysiology of Heart Failure reviewed Provided education on low sodium diet Reviewed Heart Failure Action Plan in depth and provided written copy Assessed need for readable accurate scales in home Provided education about placing scale on hard, flat surface Advised patient to weigh each morning after emptying bladder Discussed importance of daily weight and advised patient to weigh and record daily Reviewed role of diuretics in prevention of fluid overload and management of heart failure; Discussed the importance of keeping all appointments with provider Provided patient with education about the role of exercise in the management of heart failure Referral made to community resources care guide team for assistance with Transportation (SCATS application) and any other available resources; Screening for signs and symptoms of depression related to chronic disease state  Assessed social determinant of health barriers  Will mail CHF education and the HF zones on what to do if acute symptoms occur. Will include in the mail out how to reach this RN case manager for ongoing case management needs.          SDOH assessments and interventions completed:  Yes  SDOH Interventions Today    Flowsheet Row Most Recent Value  SDOH Interventions   Food Insecurity Interventions Intervention Not Indicated  Housing Interventions Intervention Not Indicated  Transportation Interventions Other (Comment),  SCAT (Specialized Community Area Transporation)  [Referral made to Careguides]  Utilities Interventions Intervention Not Indicated        Care Coordination Interventions:  Yes, provided   Follow up plan: Follow up call scheduled for 08/26/2022 '@1100'$     Encounter Outcome:  Pt. Visit Completed   Raina Mina, RN Care Management Coordinator [place] Office [phone]

## 2022-07-26 DIAGNOSIS — N2581 Secondary hyperparathyroidism of renal origin: Secondary | ICD-10-CM | POA: Diagnosis not present

## 2022-07-26 DIAGNOSIS — D631 Anemia in chronic kidney disease: Secondary | ICD-10-CM | POA: Diagnosis not present

## 2022-07-26 DIAGNOSIS — I120 Hypertensive chronic kidney disease with stage 5 chronic kidney disease or end stage renal disease: Secondary | ICD-10-CM | POA: Diagnosis not present

## 2022-07-26 DIAGNOSIS — R918 Other nonspecific abnormal finding of lung field: Secondary | ICD-10-CM | POA: Diagnosis not present

## 2022-07-26 DIAGNOSIS — R809 Proteinuria, unspecified: Secondary | ICD-10-CM | POA: Diagnosis not present

## 2022-07-26 DIAGNOSIS — E876 Hypokalemia: Secondary | ICD-10-CM | POA: Diagnosis not present

## 2022-07-26 DIAGNOSIS — N185 Chronic kidney disease, stage 5: Secondary | ICD-10-CM | POA: Diagnosis not present

## 2022-07-26 DIAGNOSIS — E1122 Type 2 diabetes mellitus with diabetic chronic kidney disease: Secondary | ICD-10-CM | POA: Diagnosis not present

## 2022-07-26 DIAGNOSIS — N189 Chronic kidney disease, unspecified: Secondary | ICD-10-CM | POA: Diagnosis not present

## 2022-07-26 DIAGNOSIS — R768 Other specified abnormal immunological findings in serum: Secondary | ICD-10-CM | POA: Diagnosis not present

## 2022-07-27 ENCOUNTER — Telehealth: Payer: Self-pay | Admitting: *Deleted

## 2022-07-27 NOTE — Telephone Encounter (Signed)
   Telephone encounter was:  Successful.  07/27/2022 Name: Austin [name] MRN: [id number] DOB: 08-Jul-1935  Austin [name] is a 87 y.o. year old male who is a primary care patient of Dorothyann Peng, NP . The community resource team was consulted for assistance with Transportation Needs   Care guide performed the following interventions: Patient provided with information about care guide support team and interviewed to confirm resource needs.  Follow Up Plan:  Will mail application for [employer] and advised  [place] opt 30 min away Aug 09 2022 at 915 and the wife will ride  [facility] [address] , [place] [address] Email : Ashby [email or web address] [email or web address]-moran [email or web address]

## 2022-07-27 NOTE — Telephone Encounter (Signed)
   Austin [name] DOB: 04-27-1935 MRN: [id number]   RIDER WAIVER AND RELEASE OF LIABILITY  For purposes of improving physical access to our facilities, [facility] is pleased to partner with third parties to provide [facility] patients or other authorized individuals the option of convenient, on-demand ground transportation services (the [place]") through use of the technology service that enables users to request on-demand ground transportation from independent third-party providers.  By opting to use and accept these [employer], I, the undersigned, hereby agree on behalf of myself, and on behalf of any minor child using the Government social research officer for whom I am the parent or legal guardian, as follows:  Government social research officer provided to me are provided by independent third-party transportation providers who are not [employer] or employees and who are unaffiliated with [employer]. [facility] is neither a transportation carrier nor a common or public carrier. [facility] has no control over the quality or safety of the transportation that occurs as a result of the [employer]. [facility] cannot guarantee that any third-party transportation provider will complete any arranged transportation service. [facility] makes no representation, warranty, or guarantee regarding the reliability, timeliness, quality, safety, suitability, or availability of any of the Transport Services or that they will be error free. I fully understand that traveling by vehicle involves risks and dangers of serious bodily injury, including permanent disability, paralysis, and death. I agree, on behalf of myself and on behalf of any minor child using the Transport Services for whom I am the parent or legal guardian, that the entire risk arising out of my use of the [employer] remains solely with me, to the maximum extent permitted under applicable law. The [employer] are provided "as is"  and "as available." [facility] disclaims all representations and warranties, express, implied or statutory, not expressly set out in these terms, including the implied warranties of merchantability and fitness for a particular purpose. I hereby waive and release [facility], its agents, employees, officers, directors, representatives, insurers, attorneys, assigns, successors, subsidiaries, and affiliates from any and all past, present, or future claims, demands, liabilities, actions, causes of action, or suits of any kind directly or indirectly arising from acceptance and use of the [employer]. I further waive and release [facility] and its affiliates from all present and future liability and responsibility for any injury or death to persons or damages to property caused by or related to the use of the [employer]. I have read this Waiver and Release of Liability, and I understand the terms used in it and their legal significance. This Waiver is freely and voluntarily given with the understanding that my right (as well as the right of any minor child for whom I am the parent or legal guardian using the [employer]) to legal recourse against [facility] in connection with the [employer] is knowingly surrendered in return for use of these services.   I attest that I read the consent document to Austin [name], gave Austin [name] the opportunity to ask questions and answered the questions asked (if any). I affirm that Austin [name] then provided consent for he's participation in this program.     Austin [name]

## 2022-08-09 DIAGNOSIS — H25043 Posterior subcapsular polar age-related cataract, bilateral: Secondary | ICD-10-CM | POA: Diagnosis not present

## 2022-08-09 DIAGNOSIS — E113293 Type 2 diabetes mellitus with mild nonproliferative diabetic retinopathy without macular edema, bilateral: Secondary | ICD-10-CM | POA: Diagnosis not present

## 2022-08-09 DIAGNOSIS — H2513 Age-related nuclear cataract, bilateral: Secondary | ICD-10-CM | POA: Diagnosis not present

## 2022-08-09 DIAGNOSIS — H52203 Unspecified astigmatism, bilateral: Secondary | ICD-10-CM | POA: Diagnosis not present

## 2022-08-09 DIAGNOSIS — H25013 Cortical age-related cataract, bilateral: Secondary | ICD-10-CM | POA: Diagnosis not present

## 2022-08-09 DIAGNOSIS — H524 Presbyopia: Secondary | ICD-10-CM | POA: Diagnosis not present

## 2022-08-09 DIAGNOSIS — H5203 Hypermetropia, bilateral: Secondary | ICD-10-CM | POA: Diagnosis not present

## 2022-08-09 LAB — HM DIABETES EYE EXAM

## 2022-08-11 ENCOUNTER — Encounter: Payer: Self-pay | Admitting: Adult Health

## 2022-08-15 ENCOUNTER — Other Ambulatory Visit: Payer: Self-pay | Admitting: Adult Health

## 2022-08-15 DIAGNOSIS — K5900 Constipation, unspecified: Secondary | ICD-10-CM

## 2022-08-16 ENCOUNTER — Other Ambulatory Visit: Payer: Self-pay | Admitting: Adult Health

## 2022-08-16 DIAGNOSIS — K5900 Constipation, unspecified: Secondary | ICD-10-CM

## 2022-08-17 ENCOUNTER — Other Ambulatory Visit: Payer: Self-pay

## 2022-08-17 MED ORDER — BLOOD GLUCOSE METER KIT: PACK | 0 refills | Status: DC

## 2022-08-25 ENCOUNTER — Telehealth: Payer: Self-pay | Admitting: *Deleted

## 2022-08-25 NOTE — Telephone Encounter (Signed)
   Telephone encounter was:  Successful.  08/25/2022 Name: Austin [name] MRN: [id number] DOB: 11-30-1934  Austin [name] is a 88 y.o. year old male who is a primary care patient of Dorothyann Peng, NP . The community resource team was consulted for assistance with Transportation Needs  patient called with questions about Access application and to book transport on 08/29/2022 booked for [facility] transport to kidney dr  Care guide performed the following interventions: Patient provided with information about care guide support team and interviewed to confirm resource needs.  Follow Up Plan:  No further follow up planned at this time. The patient has been provided with needed resources. [facility] [phone] [address] , [place] [address] Email : Ashby Dawes. Greenauer-moran [email or web address]

## 2022-08-26 ENCOUNTER — Ambulatory Visit: Payer: Self-pay | Admitting: *Deleted

## 2022-08-26 NOTE — Patient Outreach (Signed)
  Care Coordination   08/26/2022 Name: Infant Zink MRN: [id number] DOB: [birth year]   Care Coordination Outreach Attempts:  An unsuccessful telephone outreach was attempted for a scheduled appointment today.  Follow Up Plan:  Additional outreach attempts will be made to offer the patient care coordination information and services.   Encounter Outcome:  No Answer   Care Coordination Interventions:  No, not indicated    Raina Mina, RN Care Management Coordinator [place] Office [phone]

## 2022-08-29 DIAGNOSIS — E876 Hypokalemia: Secondary | ICD-10-CM | POA: Diagnosis not present

## 2022-08-29 DIAGNOSIS — N2581 Secondary hyperparathyroidism of renal origin: Secondary | ICD-10-CM | POA: Diagnosis not present

## 2022-08-29 DIAGNOSIS — N185 Chronic kidney disease, stage 5: Secondary | ICD-10-CM | POA: Diagnosis not present

## 2022-08-29 DIAGNOSIS — I120 Hypertensive chronic kidney disease with stage 5 chronic kidney disease or end stage renal disease: Secondary | ICD-10-CM | POA: Diagnosis not present

## 2022-08-29 DIAGNOSIS — E1122 Type 2 diabetes mellitus with diabetic chronic kidney disease: Secondary | ICD-10-CM | POA: Diagnosis not present

## 2022-08-29 DIAGNOSIS — D631 Anemia in chronic kidney disease: Secondary | ICD-10-CM | POA: Diagnosis not present

## 2022-08-29 LAB — LAB REPORT - SCANNED: EGFR: 9

## 2022-08-31 DIAGNOSIS — I509 Heart failure, unspecified: Secondary | ICD-10-CM | POA: Diagnosis not present

## 2022-08-31 DIAGNOSIS — J9601 Acute respiratory failure with hypoxia: Secondary | ICD-10-CM | POA: Diagnosis not present

## 2022-08-31 DIAGNOSIS — I5033 Acute on chronic diastolic (congestive) heart failure: Secondary | ICD-10-CM | POA: Diagnosis not present

## 2022-08-31 DIAGNOSIS — R531 Weakness: Secondary | ICD-10-CM | POA: Diagnosis not present

## 2022-09-05 ENCOUNTER — Ambulatory Visit: Payer: Self-pay | Admitting: *Deleted

## 2022-09-05 NOTE — Patient Instructions (Signed)
Visit Information  Thank you for taking time to visit with me today. Please don't hesitate to contact me if I can be of assistance to you.   Following are the goals we discussed today:   Goals Addressed               This Visit's Progress     COMPLETED: Transportation/CHF Management/Sleep issues (pt-stated)        Care Coordination Interventions: UPDATE Received information packet and verified pt awareness of what to do with any fluid retention related to his HF is encountered. Pt has reported some non-adherence with his daily weights however able to recite baseline weights over the last month with home scales and provider office visits. Denies any swelling with his ongoing home dialysis.  Pt also verified he received the [employer] application for transportation assistance and has started that process. Pt reports ongoing issues with no able to sleep through the night. States she has seen a provider for sleep issue. RN discussed remedies to assist with sleep habits to avoid eating late at night and drinking caffeinated products after certain hours. Encouraged pt to contact his provider if there is no improvement.  No other issues presented at pt verified he is aware of who to call with any symptoms related to his ongoing medical conditions.   Based upon this information will close this case with no additional needs to address at this time.         Please call the care guide team at [phone] if you need to cancel or reschedule your appointment.   If you are experiencing a Mental Health or [place] or need someone to talk to, please call the Suicide and Crisis Lifeline: 988  Patient verbalizes understanding of instructions and care plan provided today and agrees to view in [facility]. Active MyChart status and patient understanding of how to access instructions and care plan via MyChart confirmed with patient.     No further follow up required: No additional needs.  Raina Mina, RN Care Management Coordinator [place] Office [phone]

## 2022-09-05 NOTE — Patient Outreach (Signed)
  Care Coordination   Follow Up Visit Note   09/05/2022 Name: Austin [name] MRN: [id number] DOB: July 23, 1935  Austin [name] is a 88 y.o. year old male who sees Nafziger, Tommi Rumps, NP for primary care. I spoke with  Austin [name] by phone today.  What matters to the patients health and wellness today?  Sleep issues    Goals Addressed               This Visit's Progress     COMPLETED: Transportation/CHF Management/Sleep issues (pt-stated)        Care Coordination Interventions: UPDATE Received information packet and verified pt awareness of what to do with any fluid retention related to his HF is encountered. Pt has reported some non-adherence with his daily weights however able to recite baseline weights over the last month with home scales and provider office visits. Denies any swelling with his ongoing home dialysis.  Pt also verified he received the [employer] application for transportation assistance and has started that process. Pt reports ongoing issues with no able to sleep through the night. States she has seen a provider for sleep issue. RN discussed remedies to assist with sleep habits to avoid eating late at night and drinking caffeinated products after certain hours. Encouraged pt to contact his provider if there is no improvement.  No other issues presented at pt verified he is aware of who to call with any symptoms related to his ongoing medical conditions.   Based upon this information will close this case with no additional needs to address at this time.         SDOH assessments and interventions completed:  No     Care Coordination Interventions:  Yes, provided   Follow up plan: No further intervention required.   Encounter Outcome:  Pt. Visit Completed   Raina Mina, RN Care Management Coordinator [place] Office [phone]

## 2022-09-10 ENCOUNTER — Other Ambulatory Visit: Payer: Self-pay | Admitting: Emergency Medicine

## 2022-09-21 DIAGNOSIS — E876 Hypokalemia: Secondary | ICD-10-CM | POA: Diagnosis not present

## 2022-09-21 DIAGNOSIS — R809 Proteinuria, unspecified: Secondary | ICD-10-CM | POA: Diagnosis not present

## 2022-09-21 DIAGNOSIS — D631 Anemia in chronic kidney disease: Secondary | ICD-10-CM | POA: Diagnosis not present

## 2022-09-21 DIAGNOSIS — N2581 Secondary hyperparathyroidism of renal origin: Secondary | ICD-10-CM | POA: Diagnosis not present

## 2022-09-21 DIAGNOSIS — I5032 Chronic diastolic (congestive) heart failure: Secondary | ICD-10-CM | POA: Diagnosis not present

## 2022-09-21 DIAGNOSIS — N185 Chronic kidney disease, stage 5: Secondary | ICD-10-CM | POA: Diagnosis not present

## 2022-09-21 DIAGNOSIS — I130 Hypertensive heart and chronic kidney disease with heart failure and stage 1 through stage 4 chronic kidney disease, or unspecified chronic kidney disease: Secondary | ICD-10-CM | POA: Diagnosis not present

## 2022-09-21 DIAGNOSIS — R918 Other nonspecific abnormal finding of lung field: Secondary | ICD-10-CM | POA: Diagnosis not present

## 2022-09-21 DIAGNOSIS — E1122 Type 2 diabetes mellitus with diabetic chronic kidney disease: Secondary | ICD-10-CM | POA: Diagnosis not present

## 2022-09-22 LAB — LAB REPORT - SCANNED: EGFR: 9

## 2022-09-26 ENCOUNTER — Other Ambulatory Visit: Payer: Self-pay | Admitting: *Deleted

## 2022-09-26 DIAGNOSIS — N179 Acute kidney failure, unspecified: Secondary | ICD-10-CM

## 2022-09-29 ENCOUNTER — Ambulatory Visit (HOSPITAL_COMMUNITY)
Admission: RE | Admit: 2022-09-29 | Discharge: 2022-09-29 | Disposition: A | Discharge status: Home / Self Care | Payer: Medicare PPO | Source: Ambulatory Visit | Attending: Vascular Surgery | Admitting: Vascular Surgery

## 2022-09-29 ENCOUNTER — Encounter: Payer: Self-pay | Admitting: Podiatry

## 2022-09-29 ENCOUNTER — Ambulatory Visit: Payer: Medicare PPO | Admitting: Podiatry

## 2022-09-29 ENCOUNTER — Ambulatory Visit (INDEPENDENT_AMBULATORY_CARE_PROVIDER_SITE_OTHER)
Admission: RE | Admit: 2022-09-29 | Discharge: 2022-09-29 | Disposition: A | Discharge status: Home / Self Care | Payer: Medicare PPO | Source: Ambulatory Visit | Attending: Vascular Surgery | Admitting: Vascular Surgery

## 2022-09-29 VITALS — BP 159/64 | HR 57

## 2022-09-29 DIAGNOSIS — M79674 Pain in right toe(s): Secondary | ICD-10-CM

## 2022-09-29 DIAGNOSIS — B351 Tinea unguium: Secondary | ICD-10-CM

## 2022-09-29 DIAGNOSIS — Q828 Other specified congenital malformations of skin: Secondary | ICD-10-CM | POA: Diagnosis not present

## 2022-09-29 DIAGNOSIS — M79675 Pain in left toe(s): Secondary | ICD-10-CM

## 2022-09-29 DIAGNOSIS — N179 Acute kidney failure, unspecified: Secondary | ICD-10-CM

## 2022-09-29 DIAGNOSIS — E0821 Diabetes mellitus due to underlying condition with diabetic nephropathy: Secondary | ICD-10-CM | POA: Diagnosis not present

## 2022-09-29 DIAGNOSIS — N189 Chronic kidney disease, unspecified: Secondary | ICD-10-CM

## 2022-09-29 NOTE — Progress Notes (Signed)
Office Note     CC:  ESRD Requesting Provider:  Claudia Desanctis, MD  HPI: Austin [name] is a Right handed 88 y.o. (1935/01/03) male with kidney disease who presents at the request of Claudia Desanctis, MD for permanent HD access. The patient has had no prior access procedures.  Currently CKD 5.  On exam today, Austin [name] was doing well, accompanied by his wife.  Originally from outside of [place], he moved to [place] to attend [employer].  He met his wife who was at [employer] at the time.  After graduating with a science degree, he was drafted into the [employer] and served from 19 63-19 65, doing tours in [place].  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  I continues to live independently with his wife.  He enjoys spending time with his grandchildren and great-grandchildren.  Both of his sons are deceased.  Past Medical History:  Diagnosis Date   Acute diastolic CHF (congestive heart failure) (HCC) 09/17/2020   Acute exacerbation of CHF (congestive heart failure) ([facility]) 08/04/2019   ANEMIA DUE TO CHRONIC BLOOD LOSS 03/13/2007   CAROTID ARTERY STENOSIS 05/10/2010   CHF (congestive heart failure) ([facility])    DIABETES MELLITUS, TYPE II 09/19/2007   DISEASE, CEREBROVASCULAR NEC 03/05/2007   GERD 03/13/2007   HYPERLIPIDEMIA 03/05/2007   HYPERTENSION 03/05/2007   HYPOKALEMIA 11/09/2009   KNEE PAIN, RIGHT 11/09/2009   PROSTATE CANCER, HX OF 03/05/2007   RENAL DISEASE, CHRONIC 02/03/2009    Past Surgical History:  Procedure Laterality Date   CAROTID ARTERY ANGIOPLASTY Right Oct. 10, 2001   ESOPHAGOGASTRODUODENOSCOPY (EGD) WITH PROPOFOL N/A 11/04/2016   Procedure: ESOPHAGOGASTRODUODENOSCOPY (EGD) WITH PROPOFOL;  Surgeon: Otis Brace, MD;  Location: [facility];  Service: Gastroenterology;  Laterality: N/A;   LAPAROSCOPIC APPENDECTOMY  02/20/2012   Procedure: APPENDECTOMY LAPAROSCOPIC;  Surgeon: Stark Klein, MD;  Location: [facility];  Service: General;  Laterality: N/A;   PROSTATE SURGERY     prostatectomy    Social History   Socioeconomic History   Marital status: Married    Spouse name: Not on file   Number of children: Not on file   Years of education: Not on file   Highest education level: Not on file  Occupational History   Not on file  Tobacco Use   Smoking status: Former    Packs/day: 1.00    Years: 10.00    Total pack years: 10.00    Types: Cigarettes    Quit date: 08/22/1974    Years since quitting: 48.1   Smokeless tobacco: Never  Vaping Use   Vaping Use: Never used  Substance and Sexual Activity   Alcohol use: No    Alcohol/week: 0.0 standard drinks of alcohol   Drug use: No   Sexual activity: Not Currently  Other Topics Concern   Not on file  Social History Narrative   Retired - Environmental consultant    Married 58 years       He enjoys traveling    Investment banker, operational of Radio broadcast assistant Strain: Low Risk  (02/10/2022)   Overall Financial Resource Strain (CARDIA)    Difficulty of Paying Living Expenses: Not hard at all  Food Insecurity: No Food Insecurity (07/22/2022)   Hunger Vital Sign    Worried About Running Out of Food in the Last Year: Never true    Ran Out of Food in the Last Year: Never true  Transportation Needs: No Transportation Needs (07/27/2022)   PRAPARE - Hydrologist (Medical): No    Lack of Transportation (Non-Medical): No  Physical Activity: Insufficiently Active (02/10/2022)   Exercise Vital Sign    Days of Exercise per Week: 2 days    Minutes of Exercise per Session: 30 min  Stress: No Stress Concern Present (02/10/2022)   [place]    Feeling of Stress : Not at all  Social Connections: Socially Integrated (02/10/2022)   Social Connection and Isolation Panel [NHANES]    Frequency of Communication with Friends and Family: More than three times a week     Frequency of Social Gatherings with Friends and Family: More than three times a week    Attends Religious Services: More than 4 times per year    Active Member of [employer] or Organizations: Yes    Attends Music therapist: More than 4 times per year    Marital Status: Married  Human resources officer Violence: Not At Risk (02/10/2022)   Humiliation, Afraid, Rape, and Kick questionnaire    Fear of Current or Ex-Partner: No    Emotionally Abused: No    Physically Abused: No    Sexually Abused: No   Family History  Problem Relation Age of Onset   Hypertension Mother    Cancer Father        Mesothelioma    Stomach cancer Brother    Cancer Brother    Cancer - Cervical Brother    Cancer Brother    Diabetes Brother    Esophageal cancer Neg Hx    Colon cancer Neg Hx    Pancreatic cancer Neg Hx     Current Outpatient Medications  Medication Sig Dispense Refill   ACCU-CHEK GUIDE test strip USE TO CHECK BLOOD GLUCOSE TWICE A DAY OR AS NEEDED 100 strip 1   Accu-Chek Softclix Lancets lancets USED TO CHECK BLOOD GLUCOSE TWICE A DAY OR AS NEEDED 100 each 6   acetaminophen (TYLENOL) 325 MG tablet Take 325 mg by mouth 4 (four) times daily.     albuterol (VENTOLIN HFA) 108 (90 Base) MCG/ACT inhaler TAKE 2 PUFFS BY MOUTH EVERY 6 HOURS AS NEEDED FOR WHEEZE OR SHORTNESS OF BREATH 8.5 each 5   amLODipine (NORVASC) 10 MG tablet TAKE 1 TABLET BY MOUTH EVERY DAY 90 tablet 1   atorvastatin (LIPITOR) 20 MG tablet TAKE 1 TABLET BY MOUTH EVERY DAY 90 tablet 3   bisacodyl (DULCOLAX) 10 MG suppository Place 1 suppository (10 mg total) rectally daily as needed for mild constipation. 12 suppository 0   blood glucose meter kit and supplies Dispense based on patient and insurance preference. Use up to four times daily as directed. (FOR ICD-10 E10.9, E11.9). 1 each 0   Blood Glucose Monitoring Suppl (ACCU-CHEK AVIVA PLUS) w/Device KIT Used to check blood glucose 2 times a day or PRN (Patient taking differently:  in the morning and at bedtime.) 1 kit 0   calcitRIOL (ROCALTROL) 0.25 MCG capsule Take 0.25 mcg by mouth 3 (three) times a week.     cholecalciferol (VITAMIN D3) 25 MCG (1000 UNIT) tablet Take 1,000 Units by mouth daily.     cloNIDine (CATAPRES) 0.3 MG tablet Take 1 tablet (0.3 mg total) by mouth 3 (three) times daily. 60 tablet 0   clopidogrel (PLAVIX) 75 MG tablet TAKE 1 TABLET BY MOUTH EVERY DAY 90 tablet 3   doxazosin (CARDURA) 2 MG tablet Take  2 mg by mouth at bedtime.   3   esomeprazole (NEXIUM) 40 MG capsule TAKE 1 CAPSULE BY MOUTH EVERY DAY 90 capsule 3   eszopiclone (LUNESTA) 1 MG TABS tablet Take 1 tablet (1 mg total) by mouth at bedtime as needed for sleep. Take immediately before bedtime 30 tablet 1   glipiZIDE (GLUCOTROL XL) 5 MG 24 hr tablet Take by mouth.     hydrocortisone (ANUSOL-HC) 2.5 % rectal cream Place 1 application rectally 2 (two) times daily. 28 g 3   isosorbide-hydrALAZINE (BIDIL) 20-37.5 MG tablet Take 1 tablet by mouth 3 (three) times daily.     KLOR-CON M10 10 MEQ tablet Take by mouth.     lactulose (CHRONULAC) 10 GM/15ML solution TAKE 15ML BY MOUTH 2 TIMES DAILY AS NEEDED FOR MILD CONSTIPATION. 15 mL 1   methylcellulose (CITRUCEL) oral powder Take 1 packet by mouth See admin instructions. Every 2-3 days,alternating with lactulose     metolazone (ZAROXOLYN) 5 MG tablet Take by mouth.     metoprolol succinate (TOPROL-XL) 100 MG 24 hr tablet Take 1 tablet (100 mg total) by mouth daily. Take with or immediately following a meal. 30 tablet 0   potassium chloride SA (KLOR-CON M) 20 MEQ tablet Take 1 tablet (20 mEq total) by mouth 2 (two) times daily. 60 tablet 0   Propylene Glycol (SYSTANE BALANCE OP) Place 1 drop into both eyes at bedtime.     torsemide (DEMADEX) 20 MG tablet Take 2 tablets (40 mg total) by mouth 2 (two) times daily. 120 tablet 0   triamcinolone cream (KENALOG) 0.1 % Apply 1 application topically daily as needed (dry/irritated skin).     Current  Facility-Administered Medications  Medication Dose Route Frequency Provider Last Rate Last Admin   ammonium lactate (LAC-HYDRIN) 12 % lotion   Topical PRN Felipa Furnace, DPM        Allergies  Allergen Reactions   Aspirin Other (See Comments)    High doses causes stomach ulcer and bleeding     REVIEW OF SYSTEMS:  [X]$  denotes positive finding, [ ]$  denotes negative finding Cardiac  Comments:  Chest pain or chest pressure:    Shortness of breath upon exertion:    Short of breath when lying flat:    Irregular heart rhythm:        Vascular    Pain in calf, thigh, or hip brought on by ambulation:    Pain in feet at night that wakes you up from your sleep:     Blood clot in your veins:    Leg swelling:         Pulmonary    Oxygen at home:    Productive cough:     Wheezing:         Neurologic    Sudden weakness in arms or legs:     Sudden numbness in arms or legs:     Sudden onset of difficulty speaking or slurred speech:    Temporary loss of vision in one eye:     Problems with dizziness:         Gastrointestinal    Blood in stool:     Vomited blood:         Genitourinary    Burning when urinating:     Blood in urine:        Psychiatric    Major depression:         Hematologic    Bleeding problems:    Problems with  blood clotting too easily:        Skin    Rashes or ulcers:        Constitutional    Fever or chills:      PHYSICAL EXAMINATION:  There were no vitals filed for this visit.  General:  WDWN in NAD; vital signs documented above Gait: Not observed HENT: WNL, normocephalic Pulmonary: normal non-labored breathing , without Rales, rhonchi,  wheezing Cardiac: regular HR,  Abdomen: soft, NT, no masses Skin: without rashes Vascular Exam/Pulses:  Right Left  Radial 2+ (normal) 2+ (normal)                       Extremities: without ischemic changes, without Gangrene , without cellulitis; without open wounds;  Musculoskeletal: no muscle wasting  or atrophy  Neurologic: A&O X 3;  No focal weakness or paresthesias are detected Psychiatric:  The pt has Normal affect.   Non-Invasive Vascular Imaging:   Small superficial veins bilaterally.    ASSESSMENT/PLAN:  Austin [name] is a 88 y.o. male who presents with chronic kidney disease stage 5  Based on vein mapping and examination, Austin [name] would be best served with left-sided AV graft, as his superficial veins are small bilaterally. I had an extensive discussion with this patient in regards to the nature of access surgery, including risk, benefits, and alternatives.   The patient is aware that the risks of access surgery include but are not limited to: bleeding, infection, steal syndrome, nerve damage, ischemic monomelic neuropathy, failure of access to mature, complications related to venous hypertension, and possible need for additional access procedures in the future. The patient has agreed to proceed with the above procedure which will be scheduled at his convenience.  Austin John, MD [facility] [phone]

## 2022-09-29 NOTE — Progress Notes (Signed)
This patient returns to my office for at risk foot care.  This patient requires this care by a professional since this patient will be at risk due to having diabetic neuropathy and CKD   This patient is unable to cut nails himself since the patient cannot reach his nails.These nails are painful walking and wearing shoes.  This patient presents for at risk foot care today.  General Appearance  Alert, conversant and in no acute stress.  Vascular  Dorsalis pedis and posterior tibial  pulses are weakly  palpable  bilaterally.  Capillary return is within normal limits  bilaterally. Temperature is within normal limits  bilaterally.  Neurologic  Senn-Weinstein monofilament wire test diminished bilaterally. Muscle power within normal limits bilaterally.  Nails Thick disfigured discolored nails with subungual debris  from hallux to fifth toes bilaterally. No evidence of bacterial infection or drainage bilaterally.  Orthopedic  No limitations of motion  feet .  No crepitus or effusions noted.  No bony pathology or digital deformities noted.  HAV  B/L  Hammer toes  B/L.  Skin  normotropic skin noted bilaterally.  No signs of infections or ulcers noted.   Porokeratosis sub  3,4 and 5 left foot.  Onychomycosis  Pain in right toes  Pain in left toes  ROV.  Consent was obtained for treatment procedures.   Mechanical debridement of nails 1-5  bilaterally performed with a nail nipper.  Filed with dremel without incident. Debride porokeratosis with # 15 blade and dremel tool as a courtesy. Padding dispensed.  RTC 10 weeks.   Return office visit    10 weeks                 Told patient to return for periodic foot care and evaluation due to potential at risk complications.   Gardiner Barefoot DPM

## 2022-09-30 ENCOUNTER — Ambulatory Visit: Payer: Medicare PPO | Admitting: Vascular Surgery

## 2022-09-30 ENCOUNTER — Ambulatory Visit: Payer: Medicare PPO | Admitting: Emergency Medicine

## 2022-09-30 ENCOUNTER — Encounter: Payer: Self-pay | Admitting: Emergency Medicine

## 2022-09-30 ENCOUNTER — Other Ambulatory Visit: Payer: Self-pay

## 2022-09-30 ENCOUNTER — Encounter: Payer: Self-pay | Admitting: Vascular Surgery

## 2022-09-30 VITALS — BP 140/76 | HR 63 | Temp 97.9°F | Ht 67.0 in | Wt 177.6 lb

## 2022-09-30 VITALS — BP 168/68 | HR 57 | Temp 97.2°F | Resp 16 | Ht 67.0 in | Wt 175.0 lb

## 2022-09-30 DIAGNOSIS — R058 Other specified cough: Secondary | ICD-10-CM

## 2022-09-30 DIAGNOSIS — N184 Chronic kidney disease, stage 4 (severe): Secondary | ICD-10-CM | POA: Diagnosis not present

## 2022-09-30 DIAGNOSIS — R918 Other nonspecific abnormal finding of lung field: Secondary | ICD-10-CM | POA: Diagnosis not present

## 2022-09-30 NOTE — Assessment & Plan Note (Signed)
Stable to smaller on his most recent CT 11/2021.  We will plan to continue surveillance, repeat his CT in April 2024 and then follow-up to review.

## 2022-09-30 NOTE — Progress Notes (Signed)
   Subjective:    Patient ID: Austin [name], male    DOB: [birth year], 88 y.o.   MRN: [id number]  HPI  ROV 11/10/21 --follow-up visit for 88 year old man with a history of hypertension with diastolic dysfunction, CRI, chronic cough with upper airway irritation syndrome in the setting of GERD and chronic rhinitis.  He also has pulmonary nodular disease that we have followed with serial CT scans of the chest.  Last imaging was a PET scan 06/14/2021 that showed a stable 2.4 x 1.8 cm right upper lobe nodule with minimal hypermetabolic activity, 1.1 cm groundglass left upper lobe nodule.  We decided to manage conservatively with a repeat CT scan to look for interval change, plan for April. He returns today reporting that began to develop some R subscapular pain, began about 10 days ago. No cough. No SOB. The pain seems to have resolved.   ROV 09/30/2022 --Austin [name] is 73 with history of hypertension and diastolic dysfunction, chronic renal insufficiency, chronic cough with upper airway instability in the setting of GERD and allergic rhinitis.  I have followed him for this as well as pulmonary nodular disease that we have been monitoring conservatively.  He is also seeing Dr. Ander Slade for insomnia, ordered Johnnye Sima but he never took it because he was worried about side effects.Marland Kitchen He is planning to start HD soon for progressive renal failure. He reports that his breathing is doing well.   Most recent CT chest 11/25/2021 reviewed by me showed a slight interval decrease in size in the posterior right upper lobe nodule now 15 x 11 mm, groundglass peripheral left lower lobe nodule, stable 4.5 mm subpleural right lower lobe nodule, borderline mediastinal and hilar adenopathy.   Review of Systems As per HPI     Objective:   Physical Exam Vitals:   09/30/22 0856  BP: (!) 140/76  Pulse: 63  Temp: 97.9 F (36.6 C)  TempSrc: Oral  SpO2: 100%  Weight: 177 lb 9.6 oz (80.6 kg)  Height: '5\' 7"'$  (1.702 m)   Gen: Pleasant,  well-nourished, in no distress,  normal affect  ENT: No lesions,  mouth clear,  oropharynx clear, no postnasal drip  Neck: No JVD, no stridor  Lungs: No use of accessory muscles, no crackles or wheezing on normal respiration, no wheeze on forced expiration  Cardiovascular: RRR, heart sounds normal, no murmur or gallops, no peripheral edema  Musculoskeletal: No deformities, no cyanosis or clubbing  Neuro: alert, awake, non focal  Skin: Warm, no lesions or rash     Assessment & Plan:  Upper airway cough syndrome Doing okay at this time.  No changes planned  Pulmonary nodules Stable to smaller on his most recent CT 11/2021.  We will plan to continue surveillance, repeat his CT in April 2024 and then follow-up to review.  Baltazar Apo, MD, PhD 09/30/2022, 9:19 AM [facility] [phone] or if no answer before 7:00PM call [phone] For any issues after 7:00PM please call eLink [phone]

## 2022-09-30 NOTE — Patient Instructions (Signed)
We will plan to repeat your CT scan of the chest in April 2024 to compare with priors. Follow [name] after your CT scan to review the results together

## 2022-09-30 NOTE — Assessment & Plan Note (Signed)
Doing okay at this time.  No changes planned

## 2022-09-30 NOTE — Addendum Note (Signed)
Addended by: Dierdre Highman on: 09/30/2022 09:44 AM   Modules accepted: Orders

## 2022-10-01 DIAGNOSIS — I5033 Acute on chronic diastolic (congestive) heart failure: Secondary | ICD-10-CM | POA: Diagnosis not present

## 2022-10-01 DIAGNOSIS — R531 Weakness: Secondary | ICD-10-CM | POA: Diagnosis not present

## 2022-10-01 DIAGNOSIS — J9601 Acute respiratory failure with hypoxia: Secondary | ICD-10-CM | POA: Diagnosis not present

## 2022-10-01 DIAGNOSIS — I509 Heart failure, unspecified: Secondary | ICD-10-CM | POA: Diagnosis not present

## 2022-10-06 ENCOUNTER — Telehealth: Payer: Self-pay | Admitting: *Deleted

## 2022-10-09 ENCOUNTER — Other Ambulatory Visit: Payer: Self-pay | Admitting: Adult Health

## 2022-10-11 ENCOUNTER — Ambulatory Visit (HOSPITAL_COMMUNITY)
Admission: RE | Admit: 2022-10-11 | Discharge: 2022-10-11 | Disposition: A | Discharge status: Home / Self Care | Payer: Medicare PPO | Source: Ambulatory Visit | Attending: Nephrology | Admitting: Nephrology

## 2022-10-11 VITALS — BP 149/58 | HR 72 | Temp 97.7°F | Resp 18

## 2022-10-11 DIAGNOSIS — N185 Chronic kidney disease, stage 5: Secondary | ICD-10-CM | POA: Diagnosis not present

## 2022-10-11 DIAGNOSIS — N170 Acute kidney failure with tubular necrosis: Secondary | ICD-10-CM | POA: Diagnosis not present

## 2022-10-11 LAB — POCT HEMOGLOBIN-HEMACUE: Hemoglobin: 9 g/dL — ABNORMAL LOW (ref 13.0–17.0)

## 2022-10-11 MED ORDER — EPOETIN ALFA-EPBX 10000 UNIT/ML IJ SOLN
INTRAMUSCULAR | Status: AC
  Filled 2022-10-11: qty 1

## 2022-10-11 MED ORDER — EPOETIN ALFA-EPBX 10000 UNIT/ML IJ SOLN
10000.0000 [IU] | INTRAMUSCULAR | Status: DC
  Administered 2022-10-11: 10000 [IU] via SUBCUTANEOUS

## 2022-10-11 NOTE — Telephone Encounter (Signed)
Patient need to schedule an ov for more refills. 

## 2022-10-13 ENCOUNTER — Encounter (HOSPITAL_COMMUNITY): Payer: Self-pay

## 2022-10-13 DIAGNOSIS — H25813 Combined forms of age-related cataract, bilateral: Secondary | ICD-10-CM | POA: Diagnosis not present

## 2022-10-13 DIAGNOSIS — H10413 Chronic giant papillary conjunctivitis, bilateral: Secondary | ICD-10-CM | POA: Diagnosis not present

## 2022-10-13 DIAGNOSIS — H04123 Dry eye syndrome of bilateral lacrimal glands: Secondary | ICD-10-CM | POA: Diagnosis not present

## 2022-10-14 ENCOUNTER — Encounter (HOSPITAL_COMMUNITY): Payer: Self-pay | Admitting: Vascular Surgery

## 2022-10-14 ENCOUNTER — Other Ambulatory Visit: Payer: Self-pay

## 2022-10-14 NOTE — Progress Notes (Signed)
PCP - Dorothyann Peng, NP at [facility] Vascular: Dr. Estanislado Pandy Pulmonologist: [name]  PPM/ICD - Denies  Chest x-ray - 09/26/2021 EKG - 09/26/2021 Stress Test - Denies ECHO - 09/28/2021 Cardiac Cath - Denies  Sleep study/sleep apnea/CPAP: Denies  Type II diabetic Fasting Blood Sugar - 110-118 Checks Blood Sugar every other day.  States his PCP took him off his diabetic medication and he doesn't check it as often and sugars have been fine.   Blood Thinner Instructions: Follow surgeon instructions re: plavix Aspirin Instructions: Denies  ERAS Protcol - No, NPO  COVID TEST- Denies  Anesthesia review: yes, Htn, DM, CHF, ESRD  Patient verbally denies any shortness of breath, fever, cough and chest pain during phone call   -------------  SDW INSTRUCTIONS given:  Your procedure is scheduled on Tuesday, October 18, 2022.  Report to [facility] Main Entrance "A" at 0530 A.M., and check in at the Admitting office.  Call this number if you have problems the morning of surgery:  [phone]   Remember:  Do not eat or drink after midnight the night before your surgery   Take these medicines the morning of surgery with A SIP OF WATER  Tylenol, norvasc, lipitor, clonidine, nexium, isosorbide-hydrolazine,   As needed: Albuterol inhaler  Follow your surgeon's instructions on when to stop Plavix.  If no instructions were given by your surgeon then you will need to call the office to get those instructions.      As of today, STOP taking any Aleve, Naproxen, Ibuprofen, Motrin, Advil, Goody's, BC's, all herbal medications, fish oil, and all vitamins.             Do not wear lotions, powders, colognes, or deodorant. Men may shave face and neck.            Do not bring valuables to the hospital.            [facility] is not responsible for any belongings or valuables.  Do NOT Smoke (Tobacco/Vaping) 24 hours prior to your procedure If you use a CPAP at night, you may bring  all equipment for your overnight stay.   Contacts, glasses, dentures or bridgework may not be worn into surgery.      For patients admitted to the hospital, discharge time will be determined by your treatment team.   Patients discharged the day of surgery will not be allowed to drive home, and someone needs to stay with them for 24 hours.    Special instructions:   [place]- Preparing For Surgery  Before surgery, you can play an important role. Because skin is not sterile, your skin needs to be as free of germs as possible. You can reduce the number of germs on your skin by washing with Dial Soap before surgery.    Oral Hygiene is also important to reduce your risk of infection.  Remember - BRUSH YOUR TEETH THE MORNING OF SURGERY WITH YOUR REGULAR TOOTHPASTE  Please follow these instructions carefully.   Shower the NIGHT BEFORE SURGERY and the MORNING OF SURGERY with DIAL Soap.   Pat yourself dry with a CLEAN TOWEL.  Wear CLEAN PAJAMAS to bed the night before surgery  Place CLEAN SHEETS on your bed the night of your first shower and DO NOT SLEEP WITH PETS.   Day of Surgery: Please shower morning of surgery  Wear Clean/Comfortable clothing the morning of surgery Do not apply any deodorants/lotions.   Remember to brush your teeth WITH YOUR  REGULAR TOOTHPASTE.   Questions were answered. Patient verbalized understanding of instructions.

## 2022-10-17 ENCOUNTER — Encounter (HOSPITAL_COMMUNITY): Payer: Self-pay | Admitting: Anesthesiology

## 2022-10-17 ENCOUNTER — Telehealth: Payer: Self-pay

## 2022-10-17 NOTE — Progress Notes (Signed)
Patient was called and informed that the surgery time for tomorrow was changed to 09:17 o'clock. Patient was instructed to be at the hospital at 07:00 o'clock. Patient verbalized understanding.

## 2022-10-17 NOTE — Telephone Encounter (Signed)
Spoke with patient who stated he was seen by Dr. Terrence Dupont on last week.   Contacted Dr. Zenia Resides office. Last office visit note will be faxed to [facility] preadmission anesthesia department. Karoline Caldwell, PA-C made aware.   Spoke with patient and advised ok to proceed with surgery. Instructions reviewed- patient verbalized understanding.

## 2022-10-17 NOTE — Anesthesia Preprocedure Evaluation (Signed)
Anesthesia Evaluation  Patient identified by MRN, date of birth, ID band Patient awake    Reviewed: Allergy & Precautions, H&P , NPO status , Patient's Chart, lab work & pertinent test results  Airway Mallampati: IV  TM Distance: >3 FB Neck ROM: Full  Mouth opening: Limited Mouth Opening  Dental  (+) Teeth Intact, Dental Advisory Given   Pulmonary shortness of breath and with exertion, former smoker 10 pack year history    Pulmonary exam normal breath sounds clear to auscultation       Cardiovascular hypertension, Pt. on medications pulmonary hypertension (severe pHTN)+CHF (LVEF [id number], grade 2 diastolic dysfunction)  Normal cardiovascular exam Rhythm:Regular Rate:Normal  echo 09/28/2021 during admission for acute respiratory failure, acute on chronic diastolic CHF exacerbation, and AKI on CKD.  Echo showed newly depressed EF 40 to 45%, grade 2 DD, severely elevated pulmonary artery systolic pressure.   Neuro/Psych negative neurological ROS  negative psych ROS   GI/Hepatic Neg liver ROS,GERD  Controlled and Medicated,,  Endo/Other  diabetes, Well Controlled, Type 2    Renal/GU ESRFRenal diseaseK 3.4  negative genitourinary   Musculoskeletal negative musculoskeletal ROS (+)    Abdominal   Peds negative pediatric ROS (+)  Hematology  (+) Blood dyscrasia, anemia Hb 9.2   Anesthesia Other Findings   Reproductive/Obstetrics negative OB ROS                             Anesthesia Physical Anesthesia Plan  ASA: 4  Anesthesia Plan: General   Post-op Pain Management: Regional block* and Tylenol PO (pre-op)*   Induction:   PONV Risk Score and Plan: 2 and Ondansetron and Treatment may vary due to age or medical condition  Airway Management Planned: LMA  Additional Equipment: None  Intra-op Plan:   Post-operative Plan: Extubation in OR  Informed Consent: I have reviewed the patients  History and Physical, chart, labs and discussed the procedure including the risks, benefits and alternatives for the proposed anesthesia with the patient or authorized representative who has indicated his/her understanding and acceptance.     Dental advisory given  Plan Discussed with: CRNA  Anesthesia Plan Comments: (Graft will be in axilla, will proceed w/ GA  High risk of complications perioperatively d/t cardiac history, full code per pt and wife)       Anesthesia Quick Evaluation

## 2022-10-17 NOTE — Progress Notes (Addendum)
Anesthesia Chart Review: Same-day workup   88 year old male for insertion of left arm AV Gore-Tex graft 10/18/2022 with Dr. Virl Cagey.  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  BMP 09/21/2022 reviewed, BUN 94, creatinine 5.60, potassium 3.4, otherwise unremarkable.  Last hemoglobin 9.7 on 09/21/2022.  Patient had echo 09/28/2021 during admission for acute respiratory failure, acute on chronic diastolic CHF exacerbation, and AKI on CKD.  Echo showed newly depressed EF 40 to 45%, grade 2 DD, severely elevated pulmonary artery systolic pressure.  Unfortunately, he did not have cardiology follow-up postdischarge.  I reviewed this with anesthesiologist Dr. Fransisco Beau.  He advised patient should have outpatient follow-up with cardiology prior to undergoing surgery.  I relayed this message to surgeon's office.  Patient to be rescheduled pending cardiology evaluation.  Addendum 10/17/22: Patient reports that he has recently been followed by cardiologist Dr. Terrence Dupont who is aware of upcoming procedure.  Reviewed most recent office visit note from Dr. Terrence Dupont dated 10/07/2022 which does state the patient is scheduled to have AV fistula placement on February 27.  No acute concerns that time from cardiac standpoint, no changes to management.  He was advised to follow-up for vascular surgery as scheduled.  TTE 09/28/2021:  1. Left ventricular ejection fraction, by estimation, is 40 to 45%. The  left ventricle has mildly decreased function. The left ventricle  demonstrates global hypokinesis. There is mild concentric left ventricular  hypertrophy. Left ventricular diastolic  parameters are consistent with Grade II diastolic dysfunction  (pseudonormalization). Elevated left atrial pressure.   2. Right ventricular systolic function is normal. The right ventricular  size is moderately enlarged. There is severely  elevated pulmonary artery  systolic pressure. The estimated right ventricular systolic pressure is  [id number] mmHg.   3. Left atrial size was moderately dilated.   4. Right atrial size was moderately dilated.   5. A small pericardial effusion is present. The pericardial effusion is  circumferential. There is no evidence of cardiac tamponade.   6. The mitral valve is normal in structure. Mild mitral valve  regurgitation.   7. Tricuspid valve regurgitation is moderate to severe.   8. The aortic valve is tricuspid. There is mild calcification of the  aortic valve. There is mild thickening of the aortic valve. Aortic valve  regurgitation is moderate. Aortic valve sclerosis is present, with no  evidence of aortic valve stenosis.   9. There is borderline dilatation of the aortic root, measuring 37 mm.  10. The inferior vena cava is dilated in size with <50% respiratory  variability, suggesting right atrial pressure of 15 mmHg.   Comparison(s): Prior images reviewed side by side. The left ventricular  function is worsened. Previous study also showed grade 2 diastolic  dysfunction (elevated mean left atrial pressure).    Nuclear stress 10/05/2015: IMPRESSION: 1. No evidence for reversible ischemia.   2. Mild hypokinesia in the inferior wall, otherwise, normal wall motion.   3. Left ventricular ejection fraction is 55%.   4. Low-risk stress test findings*.     Wynonia Musty [facility] Short Stay Center/Anesthesiology Phone [phone] 10/17/2022 2:28 PM

## 2022-10-17 NOTE — Telephone Encounter (Signed)
Received notification from Karoline Caldwell, PA-C regarding patient's surgery scheduled on tomorrow. Per anesthesia, patient needs to see cardiology prior to having surgery due to multiple abnormalities on echo 09/28/21 without any follow up.   Attempted to reach patient at both numbers and wife, but no answer and unable to leave a vm.   Spoke with patient's grand-daughter, Jessee Avers regarding the need to cancel surgery. She verbalized understanding and will have patient contact office for further information.

## 2022-10-18 ENCOUNTER — Ambulatory Visit (HOSPITAL_COMMUNITY): Payer: Medicare PPO | Admitting: Anesthesiology

## 2022-10-18 ENCOUNTER — Ambulatory Visit (HOSPITAL_BASED_OUTPATIENT_CLINIC_OR_DEPARTMENT_OTHER): Payer: Medicare PPO | Admitting: Anesthesiology

## 2022-10-18 ENCOUNTER — Ambulatory Visit (HOSPITAL_COMMUNITY)
Admission: RE | Admit: 2022-10-18 | Discharge: 2022-10-18 | Disposition: A | Discharge status: Home / Self Care | Payer: Medicare PPO | Attending: Vascular Surgery | Admitting: Vascular Surgery

## 2022-10-18 ENCOUNTER — Other Ambulatory Visit: Payer: Self-pay

## 2022-10-18 ENCOUNTER — Other Ambulatory Visit (HOSPITAL_COMMUNITY): Payer: Self-pay

## 2022-10-18 ENCOUNTER — Encounter (HOSPITAL_COMMUNITY)
Admission: RE | Disposition: A | Discharge status: Home / Self Care | Payer: Self-pay | Source: Home / Self Care | Attending: Vascular Surgery

## 2022-10-18 ENCOUNTER — Encounter (HOSPITAL_COMMUNITY): Payer: Self-pay | Admitting: Vascular Surgery

## 2022-10-18 DIAGNOSIS — D631 Anemia in chronic kidney disease: Secondary | ICD-10-CM | POA: Diagnosis not present

## 2022-10-18 DIAGNOSIS — N185 Chronic kidney disease, stage 5: Secondary | ICD-10-CM

## 2022-10-18 DIAGNOSIS — N184 Chronic kidney disease, stage 4 (severe): Secondary | ICD-10-CM

## 2022-10-18 DIAGNOSIS — I509 Heart failure, unspecified: Secondary | ICD-10-CM | POA: Diagnosis not present

## 2022-10-18 DIAGNOSIS — Z8673 Personal history of transient ischemic attack (TIA), and cerebral infarction without residual deficits: Secondary | ICD-10-CM | POA: Insufficient documentation

## 2022-10-18 DIAGNOSIS — E1122 Type 2 diabetes mellitus with diabetic chronic kidney disease: Secondary | ICD-10-CM

## 2022-10-18 DIAGNOSIS — Z87891 Personal history of nicotine dependence: Secondary | ICD-10-CM

## 2022-10-18 DIAGNOSIS — I5033 Acute on chronic diastolic (congestive) heart failure: Secondary | ICD-10-CM | POA: Insufficient documentation

## 2022-10-18 DIAGNOSIS — Z992 Dependence on renal dialysis: Secondary | ICD-10-CM | POA: Insufficient documentation

## 2022-10-18 DIAGNOSIS — N186 End stage renal disease: Secondary | ICD-10-CM | POA: Diagnosis not present

## 2022-10-18 DIAGNOSIS — I132 Hypertensive heart and chronic kidney disease with heart failure and with stage 5 chronic kidney disease, or end stage renal disease: Secondary | ICD-10-CM | POA: Insufficient documentation

## 2022-10-18 DIAGNOSIS — Z7902 Long term (current) use of antithrombotics/antiplatelets: Secondary | ICD-10-CM | POA: Diagnosis not present

## 2022-10-18 DIAGNOSIS — Z09 Encounter for follow-up examination after completed treatment for conditions other than malignant neoplasm: Secondary | ICD-10-CM | POA: Insufficient documentation

## 2022-10-18 DIAGNOSIS — K219 Gastro-esophageal reflux disease without esophagitis: Secondary | ICD-10-CM | POA: Diagnosis not present

## 2022-10-18 HISTORY — PX: AV FISTULA PLACEMENT: SHX1204

## 2022-10-18 LAB — GLUCOSE, CAPILLARY
Glucose-Capillary: 152 mg/dL — ABNORMAL HIGH (ref 70–99)
Glucose-Capillary: 162 mg/dL — ABNORMAL HIGH (ref 70–99)

## 2022-10-18 LAB — POCT I-STAT, CHEM 8
BUN: 117 mg/dL — ABNORMAL HIGH (ref 8–23)
Calcium, Ion: 1.08 mmol/L — ABNORMAL LOW (ref 1.15–1.40)
Chloride: 107 mmol/L (ref 98–111)
Creatinine, Ser: 6.8 mg/dL — ABNORMAL HIGH (ref 0.61–1.24)
Glucose, Bld: 150 mg/dL — ABNORMAL HIGH (ref 70–99)
HCT: 27 % — ABNORMAL LOW (ref 39.0–52.0)
Hemoglobin: 9.2 g/dL — ABNORMAL LOW (ref 13.0–17.0)
Potassium: 3.4 mmol/L — ABNORMAL LOW (ref 3.5–5.1)
Sodium: 146 mmol/L — ABNORMAL HIGH (ref 135–145)
TCO2: 25 mmol/L (ref 22–32)

## 2022-10-18 SURGERY — INSERTION OF ARTERIOVENOUS (AV) GORE-TEX GRAFT ARM
Anesthesia: General | Site: Arm Upper | Laterality: Left

## 2022-10-18 SURGERY — INSERTION OF ARTERIOVENOUS (AV) GORE-TEX GRAFT ARM
Anesthesia: Monitor Anesthesia Care | Laterality: Left

## 2022-10-18 MED ORDER — CHLORHEXIDINE GLUCONATE 4 % EX LIQD: 60.0000 mL | Freq: Once | CUTANEOUS | Status: DC

## 2022-10-18 MED ORDER — CEFAZOLIN SODIUM-DEXTROSE 2-3 GM-%(50ML) IV SOLR: INTRAVENOUS | Status: DC | PRN

## 2022-10-18 MED ORDER — FENTANYL CITRATE (PF) 250 MCG/5ML IJ SOLN
INTRAMUSCULAR | Status: AC
  Filled 2022-10-18: qty 5

## 2022-10-18 MED ORDER — CHLORHEXIDINE GLUCONATE 0.12 % MT SOLN: 15.0000 mL | OROMUCOSAL | Status: DC

## 2022-10-18 MED ORDER — CEFAZOLIN SODIUM-DEXTROSE 2-4 GM/100ML-% IV SOLN: 2.0000 g | INTRAVENOUS | Status: DC

## 2022-10-18 MED ORDER — SODIUM CHLORIDE 0.9 % IV SOLN: INTRAVENOUS | Status: DC

## 2022-10-18 MED ORDER — CHLORHEXIDINE GLUCONATE 4 % EX LIQD
60.0000 mL | Freq: Once | CUTANEOUS | Status: AC
  Administered 2022-10-18: 4 via TOPICAL

## 2022-10-18 MED ORDER — OXYCODONE-ACETAMINOPHEN 5-325 MG PO TABS
1.0000 | ORAL_TABLET | Freq: Four times a day (QID) | ORAL | 0 refills | Status: DC | PRN
  Filled 2022-10-18: qty 20, 5d supply, fill #0

## 2022-10-18 MED ORDER — FENTANYL CITRATE (PF) 100 MCG/2ML IJ SOLN: 25.0000 ug | INTRAMUSCULAR | Status: DC | PRN

## 2022-10-18 MED ORDER — HEPARIN 6000 UNIT IRRIGATION SOLUTION
Status: DC | PRN
  Administered 2022-10-18: 1

## 2022-10-18 MED ORDER — CEFAZOLIN SODIUM-DEXTROSE 2-4 GM/100ML-% IV SOLN
INTRAVENOUS | Status: AC
  Filled 2022-10-18: qty 100

## 2022-10-18 MED ORDER — CEFAZOLIN SODIUM-DEXTROSE 2-3 GM-%(50ML) IV SOLR
INTRAVENOUS | Status: DC | PRN
  Administered 2022-10-18: 2 g via INTRAVENOUS

## 2022-10-18 MED ORDER — HEPARIN SODIUM (PORCINE) 1000 UNIT/ML IJ SOLN
INTRAMUSCULAR | Status: DC | PRN
  Administered 2022-10-18: 2000 [IU] via INTRAVENOUS

## 2022-10-18 MED ORDER — PHENYLEPHRINE HCL (PRESSORS) 10 MG/ML IV SOLN
INTRAVENOUS | Status: DC | PRN
  Administered 2022-10-18: 160 ug via INTRAVENOUS
  Administered 2022-10-18: 80 ug via INTRAVENOUS

## 2022-10-18 MED ORDER — OXYCODONE HCL 5 MG PO TABS: 5.0000 mg | ORAL_TABLET | Freq: Once | ORAL | Status: DC | PRN

## 2022-10-18 MED ORDER — 0.9 % SODIUM CHLORIDE (POUR BTL) OPTIME
TOPICAL | Status: DC | PRN
  Administered 2022-10-18: 1000 mL

## 2022-10-18 MED ORDER — DEXAMETHASONE SODIUM PHOSPHATE 4 MG/ML IJ SOLN
INTRAMUSCULAR | Status: DC | PRN
  Administered 2022-10-18: 4 mg via INTRAVENOUS

## 2022-10-18 MED ORDER — PROPOFOL 10 MG/ML IV BOLUS
INTRAVENOUS | Status: AC
  Filled 2022-10-18: qty 20

## 2022-10-18 MED ORDER — OXYCODONE HCL 5 MG/5ML PO SOLN: 5.0000 mg | Freq: Once | ORAL | Status: DC | PRN

## 2022-10-18 MED ORDER — ONDANSETRON HCL 4 MG/2ML IJ SOLN
INTRAMUSCULAR | Status: DC | PRN
  Administered 2022-10-18: 4 mg via INTRAVENOUS

## 2022-10-18 MED ORDER — ACETAMINOPHEN 500 MG PO TABS
1000.0000 mg | ORAL_TABLET | Freq: Once | ORAL | Status: DC
  Filled 2022-10-18: qty 2

## 2022-10-18 MED ORDER — PHENYLEPHRINE HCL-NACL 20-0.9 MG/250ML-% IV SOLN
INTRAVENOUS | Status: DC | PRN
  Administered 2022-10-18: 30 ug/min via INTRAVENOUS

## 2022-10-18 MED ORDER — FENTANYL CITRATE (PF) 100 MCG/2ML IJ SOLN
INTRAMUSCULAR | Status: DC | PRN
  Administered 2022-10-18: 25 ug via INTRAVENOUS
  Administered 2022-10-18: 50 ug via INTRAVENOUS

## 2022-10-18 MED ORDER — HEMOSTATIC AGENTS (NO CHARGE) OPTIME
TOPICAL | Status: DC | PRN
  Administered 2022-10-18: 1 via TOPICAL

## 2022-10-18 MED ORDER — EPHEDRINE SULFATE-NACL 50-0.9 MG/10ML-% IV SOSY
PREFILLED_SYRINGE | INTRAVENOUS | Status: DC | PRN
  Administered 2022-10-18 (×3): 5 mg via INTRAVENOUS

## 2022-10-18 MED ORDER — ONDANSETRON HCL 4 MG/2ML IJ SOLN: 4.0000 mg | Freq: Once | INTRAMUSCULAR | Status: DC | PRN

## 2022-10-18 MED ORDER — INSULIN ASPART 100 UNIT/ML IJ SOLN: 0.0000 [IU] | INTRAMUSCULAR | Status: DC | PRN

## 2022-10-18 MED ORDER — PROPOFOL 10 MG/ML IV BOLUS
INTRAVENOUS | Status: DC | PRN
  Administered 2022-10-18: 40 mg via INTRAVENOUS
  Administered 2022-10-18: 80 mg via INTRAVENOUS

## 2022-10-18 MED ORDER — HEPARIN 6000 UNIT IRRIGATION SOLUTION
Status: AC
  Filled 2022-10-18: qty 500

## 2022-10-18 MED ORDER — LIDOCAINE HCL (PF) 1 % IJ SOLN
INTRAMUSCULAR | Status: AC
  Filled 2022-10-18: qty 30

## 2022-10-18 MED ORDER — CHLORHEXIDINE GLUCONATE 0.12 % MT SOLN
OROMUCOSAL | Status: AC
  Filled 2022-10-18: qty 15

## 2022-10-18 SURGICAL SUPPLY — 40 items
ADH SKN CLS APL DERMABOND .7 (GAUZE/BANDAGES/DRESSINGS) ×1
ARMBAND PINK RESTRICT EXTREMIT (MISCELLANEOUS) ×2 IMPLANT
BAG COUNTER SPONGE SURGICOUNT (BAG) ×2 IMPLANT
BAG SPNG CNTER NS LX DISP (BAG) ×1
BLADE CLIPPER SURG (BLADE) ×2 IMPLANT
BNDG ELASTIC 4X5.8 VLCR STR LF (GAUZE/BANDAGES/DRESSINGS) ×2 IMPLANT
CANISTER SUCT 3000ML PPV (MISCELLANEOUS) ×2 IMPLANT
CLIP LIGATING EXTRA MED SLVR (CLIP) ×2 IMPLANT
CLIP LIGATING EXTRA SM BLUE (MISCELLANEOUS) ×2 IMPLANT
CLIP VESOCCLUDE MED 6/CT (CLIP) ×2 IMPLANT
COVER PROBE W GEL 5X96 (DRAPES) ×2 IMPLANT
DERMABOND ADVANCED .7 DNX12 (GAUZE/BANDAGES/DRESSINGS) ×2 IMPLANT
ELECT REM PT RETURN 9FT ADLT (ELECTROSURGICAL) ×1
ELECTRODE REM PT RTRN 9FT ADLT (ELECTROSURGICAL) ×2 IMPLANT
GLOVE BIOGEL PI IND STRL 8 (GLOVE) ×2 IMPLANT
GOWN STRL REUS W/ TWL LRG LVL3 (GOWN DISPOSABLE) ×4 IMPLANT
GOWN STRL REUS W/TWL 2XL LVL3 (GOWN DISPOSABLE) ×4 IMPLANT
GOWN STRL REUS W/TWL LRG LVL3 (GOWN DISPOSABLE) ×2
GRAFT GORETEX STRT 4-7X45 (Vascular Products) IMPLANT
HEMOSTAT SNOW SURGICEL 2X4 (HEMOSTASIS) IMPLANT
KIT BASIN OR (CUSTOM PROCEDURE TRAY) ×2 IMPLANT
KIT TURNOVER KIT B (KITS) ×2 IMPLANT
NS IRRIG 1000ML POUR BTL (IV SOLUTION) ×2 IMPLANT
PACK CV ACCESS (CUSTOM PROCEDURE TRAY) ×2 IMPLANT
PAD ARMBOARD 7.5X6 YLW CONV (MISCELLANEOUS) ×4 IMPLANT
SLING ARM FOAM STRAP LRG (SOFTGOODS) IMPLANT
SLING ARM FOAM STRAP MED (SOFTGOODS) IMPLANT
SPIKE FLUID TRANSFER (MISCELLANEOUS) ×2 IMPLANT
SUT GORETEX 6.0 TT13 (SUTURE) IMPLANT
SUT MNCRL AB 4-0 PS2 18 (SUTURE) ×2 IMPLANT
SUT PROLENE 5 0 C 1 24 (SUTURE) IMPLANT
SUT PROLENE 6 0 BV (SUTURE) ×2 IMPLANT
SUT PROLENE 7 0 BV 1 (SUTURE) IMPLANT
SUT SILK 2 0 PERMA HAND 18 BK (SUTURE) IMPLANT
SUT SILK 3 0 SH CR/8 (SUTURE) ×2 IMPLANT
SUT VIC AB 3-0 SH 27 (SUTURE) ×1
SUT VIC AB 3-0 SH 27X BRD (SUTURE) ×4 IMPLANT
TOWEL GREEN STERILE (TOWEL DISPOSABLE) ×2 IMPLANT
UNDERPAD 30X36 HEAVY ABSORB (UNDERPADS AND DIAPERS) ×2 IMPLANT
WATER STERILE IRR 1000ML POUR (IV SOLUTION) ×2 IMPLANT

## 2022-10-18 NOTE — H&P (Signed)
Office Note   Patient seen and examined in preop holding.  No complaints. No changes to medication history or physical exam since last seen in clinic. After discussing the risks and benefits of left arm AV graft, Austin [name] elected to proceed.   Austin John MD   CC:  ESRD Requesting Provider:  No ref. provider found  HPI: Austin [name] is a Right handed 88 y.o. (09/28/1934) male with kidney disease who presents at the request of No ref. provider found for permanent HD access. The patient has had no prior access procedures.  Currently CKD 5.  On exam today, Austin [name] was doing well, accompanied by his wife.  Originally from outside of [place], he moved to [place] to attend [employer].  He met his wife who was at [employer] at the time.  After graduating with a science degree, he was drafted into the [employer] and served from 19 63-19 65, doing tours in [place].  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  I continues to live independently with his wife.  He enjoys spending time with his grandchildren and great-grandchildren.  Both of his sons are deceased.  Past Medical History:  Diagnosis Date   Acute diastolic CHF (congestive heart failure) ([facility]) 09/17/2020   Acute exacerbation of CHF (congestive heart failure) ([facility]) 08/04/2019   ANEMIA DUE TO CHRONIC BLOOD LOSS 03/13/2007   CAROTID ARTERY STENOSIS 05/10/2010   CHF (congestive heart failure) ([place])    DIABETES MELLITUS, TYPE II 09/19/2007   DISEASE, CEREBROVASCULAR NEC 03/05/2007   GERD 03/13/2007   HYPERLIPIDEMIA 03/05/2007   HYPERTENSION 03/05/2007   HYPOKALEMIA 11/09/2009   KNEE PAIN, RIGHT 11/09/2009   PROSTATE CANCER, HX OF 03/05/2007   RENAL DISEASE, CHRONIC 02/03/2009    Past Surgical History:  Procedure Laterality Date   CAROTID ARTERY ANGIOPLASTY Right Oct. 10, 2001   ESOPHAGOGASTRODUODENOSCOPY (EGD) WITH PROPOFOL N/A 11/04/2016    Procedure: ESOPHAGOGASTRODUODENOSCOPY (EGD) WITH PROPOFOL;  Surgeon: Austin Brace, MD;  Location: [facility];  Service: Gastroenterology;  Laterality: N/A;   LAPAROSCOPIC APPENDECTOMY  02/20/2012   Procedure: APPENDECTOMY LAPAROSCOPIC;  Surgeon: Austin Klein, MD;  Location: [facility] OR;  Service: General;  Laterality: N/A;   PROSTATE SURGERY     prostatectomy    Social History   Socioeconomic History   Marital status: Married    Spouse name: Not on file   Number of children: Not on file   Years of education: Not on file   Highest education level: Not on file  Occupational History   Not on file  Tobacco Use   Smoking status: Former    Packs/day: 1.00    Years: 10.00    Total pack years: 10.00    Types: Cigarettes    Quit date: 08/22/1974    Years since quitting: 48.1   Smokeless tobacco: Never  Vaping Use   Vaping Use: Never used  Substance and Sexual Activity   Alcohol use: No    Alcohol/week: 0.0 standard drinks of alcohol   Drug use: No   Sexual activity: Not Currently  Other Topics Concern   Not on file  Social History Narrative   Retired - Environmental consultant    Married 67 years       He enjoys traveling    Social Determinants of Radio broadcast assistant Strain: Low Risk  (02/10/2022)   Overall Financial Resource Strain (CARDIA)    Difficulty of Paying Living  Expenses: Not hard at all  Food Insecurity: No Food Insecurity (07/22/2022)   Hunger Vital Sign    Worried About Running Out of Food in the Last Year: Never true    Ran Out of Food in the Last Year: Never true  Transportation Needs: No Transportation Needs (07/27/2022)   PRAPARE - Hydrologist (Medical): No    Lack of Transportation (Non-Medical): No  Physical Activity: Insufficiently Active (02/10/2022)   Exercise Vital Sign    Days of Exercise per Week: 2 days    Minutes of Exercise per Session: 30 min  Stress: No Stress Concern Present (02/10/2022)   [place]    Feeling of Stress : Not at all  Social Connections: [place] (02/10/2022)   Social Connection and Isolation Panel [NHANES]    Frequency of Communication with Friends and Family: More than three times a week    Frequency of Social Gatherings with Friends and Family: More than three times a week    Attends Religious Services: More than 4 times per year    Active Member of [employer] or Organizations: Yes    Attends Music therapist: More than 4 times per year    Marital Status: Married  Human resources officer Violence: Not At Risk (02/10/2022)   Humiliation, Afraid, Rape, and Kick questionnaire    Fear of Current or Ex-Partner: No    Emotionally Abused: No    Physically Abused: No    Sexually Abused: No   Family History  Problem Relation Age of Onset   Hypertension Mother    Cancer Father        Mesothelioma    Stomach cancer Brother    Cancer Brother    Cancer - Cervical Brother    Cancer Brother    Diabetes Brother    Esophageal cancer Neg Hx    Colon cancer Neg Hx    Pancreatic cancer Neg Hx     Current Facility-Administered Medications  Medication Dose Route Frequency Provider Last Rate Last Admin   0.9 %  sodium chloride infusion   Intravenous Continuous Austin John, MD       acetaminophen (TYLENOL) tablet 1,000 mg  1,000 mg Oral Once [name], Austin M, DO       ceFAZolin (ANCEF) 2-4 GM/100ML-% IVPB            ceFAZolin (ANCEF) IVPB 2g/100 mL premix  2 g Intravenous 30 min Pre-Op Austin John, MD       chlorhexidine (HIBICLENS) 4 % liquid 4 Application  60 mL Topical Once Austin John, MD       And   [START ON 10/19/2022] chlorhexidine (HIBICLENS) 4 % liquid 4 Application  60 mL Topical Once Austin John, MD       chlorhexidine (PERIDEX) 0.12 % solution 15 mL  15 mL Mouth/Throat NOW [name], Austin M, DO       chlorhexidine (PERIDEX) 0.12 % solution             Allergies   Allergen Reactions   Aspirin Other (See Comments)    High doses causes stomach ulcer and bleeding     REVIEW OF SYSTEMS:  '[X]'$  denotes positive finding, '[ ]'$  denotes negative finding Cardiac  Comments:  Chest pain or chest pressure:    Shortness of breath upon exertion:    Short of breath when lying flat:    Irregular heart rhythm:  Vascular    Pain in calf, thigh, or hip brought on by ambulation:    Pain in feet at night that wakes you up from your sleep:     Blood clot in your veins:    Leg swelling:         Pulmonary    Oxygen at home:    Productive cough:     Wheezing:         Neurologic    Sudden weakness in arms or legs:     Sudden numbness in arms or legs:     Sudden onset of difficulty speaking or slurred speech:    Temporary loss of vision in one eye:     Problems with dizziness:         Gastrointestinal    Blood in stool:     Vomited blood:         Genitourinary    Burning when urinating:     Blood in urine:        Psychiatric    Major depression:         Hematologic    Bleeding problems:    Problems with blood clotting too easily:        Skin    Rashes or ulcers:        Constitutional    Fever or chills:      PHYSICAL EXAMINATION:  Vitals:   10/14/22 1708 10/18/22 0719  BP:  (!) 148/81  Pulse:  90  Resp:  18  Temp:  98.9 F (37.2 C)  TempSrc:  Oral  SpO2:  95%  Weight: 79.4 kg 79.4 kg  Height: '5\' 7"'$  (1.702 [name]) '5\' 7"'$  (1.702 [name])    General:  WDWN in NAD; vital signs documented above Gait: Not observed HENT: WNL, normocephalic Pulmonary: normal non-labored breathing , without Rales, rhonchi,  wheezing Cardiac: regular HR,  Abdomen: soft, NT, no masses Skin: without rashes Vascular Exam/Pulses:  Right Left  Radial 2+ (normal) 2+ (normal)                       Extremities: without ischemic changes, without Gangrene , without cellulitis; without open wounds;  Musculoskeletal: no muscle wasting or atrophy  Neurologic: A&O  X 3;  No focal weakness or paresthesias are detected Psychiatric:  The pt has Normal affect.   Non-Invasive Vascular Imaging:   Small superficial veins bilaterally.    ASSESSMENT/PLAN:  Mekel Bridewell is a 88 y.o. male who presents with chronic kidney disease stage 5  Based on vein mapping and examination, Kaile would be best served with left-sided AV graft, as his superficial veins are small bilaterally. I had an extensive discussion with this patient in regards to the nature of access surgery, including risk, benefits, and alternatives.   The patient is aware that the risks of access surgery include but are not limited to: bleeding, infection, steal syndrome, nerve damage, ischemic monomelic neuropathy, failure of access to mature, complications related to venous hypertension, and possible need for additional access procedures in the future. The patient has agreed to proceed with the above procedure which will be scheduled at his convenience.  Austin John, MD [facility] [phone]

## 2022-10-18 NOTE — Op Note (Signed)
    NAME: Austin [name]    MRN: [id number] DOB: 15-Aug-1935    DATE OF OPERATION: 10/18/2022  PREOP DIAGNOSIS:    CKD 5  POSTOP DIAGNOSIS:    Same  PROCEDURE:    Left arm brachial artery to axillary vein AV graft with 4-78m PTFE  SURGEON: JBroadus John ASSIST: ERoxy Horseman ANESTHESIA: Mac   EBL: 124m INDICATIONS:    Austin Antonovichs a [age].o. male with CKD5 in need of long term HD access. Pt without usable superficial veins. After discussing the risks and benefits of left arm AV graft, LeYechiellected to proceed.   FINDINGS:    15m8mrachial artery 7mm13millary vein  TECHNIQUE:   After informed consent was obtained, the patient was brought to the operating room, placed supine on the operating room table.  Anesthesia was used.  The patient was prepped and draped in normal sterile fashion.  Surgical time-out was taken. Pre-op antibiotics were given.  Procedure began with using ultrasound and sent the brachial artery and axillary vein in the left arm.  Both proved of sufficient size to continue with AV graft.  A longitudinal incision was made at the distal aspect of the humerus. The brachial artery was identified, gently dissected, and encircled with silastic loops. A second incision was then made over the axillary vein and similarly dissected and encircled. The tunneling device was then passed between these incisions. A 4 X 7 taper PTFE graft was passed through the tunnel, taking care to avoid kinking or twists. The graft was irrigated with heparinized saline solution.   Proximal and distal arterial control was then obtained and a longitudinal arteriotomy was made on the brachial artery. The artery was irrigated with heparinized saline solution. An end to side artery anastomosis was then performed from the 4mm 57mft to the brachial vein in a running fashion using running 6-0 prolene suture. When the clamps were removed from the artery, the graft demonstrated excellent inflow.  It was flushed with heparinized saline prior to being clamped. There was an excellent pulse at the wrist.  Attention was then directed to the axillary vein anastomosis. Proximal and distal control were obtained and a longitudinal venotomy was made. The vein was then irrigated with heparinized saline solution. The end of the PTFE graft was then cut in a beveled fashion at the appropriate length. An end to side vein anastomosis was then performed, using a two,running, 6-0 prolene sutures. Prior to establishing flow, all vessels were back bled and flushed to ensure there was no clot.    There was a strong distal signal in the brachial artery and a thrill in the vein centrally. Hemostasis was found to be satisfactory, and all wounds were irrigated with saline solution and closed in layers with vicryl and Monocryl at the skin. A dry sterile dressing was applied. Anesthetic care was terminated. The patient was transported to the recovery room in stable condition.   JoshuMacie BurowsVascular and [facility] OF DICTATION:   10/18/2022

## 2022-10-18 NOTE — Discharge Instructions (Signed)
° °  [facility] [place] ° °Discharge Instructions ° °AV Fistula or Graft Surgery for Dialysis Access ° °Please refer to the following instructions for your post-procedure care. Your surgeon or physician assistant will discuss any changes with you. ° °Activity ° °You may drive the day following your surgery, if you are comfortable and no longer taking prescription pain medication. Resume full activity as the soreness in your incision resolves. ° °Bathing/Showering ° °You may shower after you go home. Keep your incision dry for 48 hours. Do not soak in a bathtub, hot tub, or swim until the incision heals completely. You may not shower if you have a hemodialysis catheter. ° °Incision Care ° °Clean your incision with mild soap and water after 48 hours. Pat the area dry with a clean towel. You do not need a bandage unless otherwise instructed. Do not apply any ointments or creams to your incision. You may have skin glue on your incision. Do not peel it off. It will come off on its own in about one week. Your arm may swell a bit after surgery. To reduce swelling use pillows to elevate your arm so it is above your heart. Your doctor will tell you if you need to lightly wrap your arm with an ACE bandage. ° °Diet ° °Resume your normal diet. There are not special food restrictions following this procedure. In order to heal from your surgery, it is CRITICAL to get adequate nutrition. Your body requires vitamins, minerals, and protein. Vegetables are the best source of vitamins and minerals. Vegetables also provide the perfect balance of protein. Processed food has little nutritional value, so try to avoid this. ° °Medications ° °Resume taking all of your medications. If your incision is causing pain, you may take over-the counter pain relievers such as acetaminophen (Tylenol). If you were prescribed a stronger pain medication, please be aware these medications can cause nausea and constipation. Prevent  nausea by taking the medication with a snack or meal. Avoid constipation by drinking plenty of fluids and eating foods with high amount of fiber, such as fruits, vegetables, and grains. Do not take Tylenol if you are taking prescription pain medications. ° ° ° ° °Follow up °Your surgeon may want to see you in the office following your access surgery. If so, this will be arranged at the time of your surgery. ° °Please call us immediately for any of the following conditions: ° °Increased pain, redness, drainage (pus) from your incision site °Fever of 101 degrees or higher °Severe or worsening pain at your incision site °Hand pain or numbness. ° °Reduce your risk of vascular disease: ° °Stop smoking. If you would like help, call [facility] at 1-800-QUIT-NOW (1-800-784-8669) or  at 336-586-4000 ° °Manage your cholesterol °Maintain a desired weight °Control your diabetes °Keep your blood pressure down ° °Dialysis ° °It will take several weeks to several months for your new dialysis access to be ready for use. Your surgeon will determine when it is OK to use it. Your nephrologist will continue to direct your dialysis. You can continue to use your Permcath until your new access is ready for use. ° °If you have any questions, please call the office at [phone]. ° °

## 2022-10-18 NOTE — Anesthesia Procedure Notes (Signed)
Procedure Name: LMA Insertion Date/Time: 10/18/2022 9:24 AM  Performed by: Lieutenant Diego, CRNAPre-anesthesia Checklist: Patient identified, Emergency Drugs available, Suction available and Patient being monitored Patient Re-evaluated:Patient Re-evaluated prior to induction Oxygen Delivery Method: Circle system utilized Preoxygenation: Pre-oxygenation with 100% oxygen Induction Type: IV induction Ventilation: Mask ventilation without difficulty LMA: LMA inserted LMA Size: 4.0 Number of attempts: 1 Placement Confirmation: positive ETCO2 and breath sounds checked- equal and bilateral Tube secured with: Tape Dental Injury: Teeth and Oropharynx as per pre-operative assessment

## 2022-10-18 NOTE — Transfer of Care (Signed)
Immediate Anesthesia Transfer of Care Note  Patient: Austin [name]  Procedure(s) Performed: INSERTION OF LEFT ARM BRACHIAL ARTERY TO AXILLARY VEIN ARTERIOVENOUS (AV) GORE-TEX GRAFT (Left: Arm Upper)  Patient Location: PACU  Anesthesia Type:General  Level of Consciousness: awake  Airway & Oxygen Therapy: Patient Spontanous Breathing and Patient connected to nasal cannula oxygen  Post-op Assessment: Report given to RN and Post -op Vital signs reviewed and stable  Post vital signs: Reviewed and stable  Last Vitals:  Vitals Value Taken Time  BP    Temp    Pulse 76 10/18/22 1106  Resp 28 10/18/22 1106  SpO2 93 % 10/18/22 1106  Vitals shown include unvalidated device data.  Last Pain:  Vitals:   10/18/22 0743  TempSrc:   PainSc: 0-No pain         Complications: No notable events documented.

## 2022-10-18 NOTE — Anesthesia Postprocedure Evaluation (Signed)
Anesthesia Post Note  Patient: Austin [name]  Procedure(s) Performed: INSERTION OF LEFT ARM BRACHIAL ARTERY TO AXILLARY VEIN ARTERIOVENOUS (AV) GORE-TEX GRAFT (Left: Arm Upper)     Patient location during evaluation: PACU Anesthesia Type: General Level of consciousness: awake and alert, oriented and patient cooperative Pain management: pain level controlled Vital Signs Assessment: post-procedure vital signs reviewed and stable Respiratory status: spontaneous breathing, nonlabored ventilation and respiratory function stable Cardiovascular status: blood pressure returned to baseline and stable Postop Assessment: no apparent nausea or vomiting Anesthetic complications: no   No notable events documented.  Last Vitals:  Vitals:   10/18/22 1105 10/18/22 1120  BP: (!) 151/62 (!) 146/64  Pulse: 76 73  Resp: 15 17  Temp: 36.8 C   SpO2: 93% 100%    Last Pain:  Vitals:   10/18/22 1105  TempSrc:   PainSc: 0-No pain                 Pervis Hocking

## 2022-10-19 ENCOUNTER — Encounter (HOSPITAL_COMMUNITY): Payer: Self-pay | Admitting: Vascular Surgery

## 2022-10-21 ENCOUNTER — Telehealth: Payer: Self-pay | Admitting: *Deleted

## 2022-10-21 NOTE — Telephone Encounter (Signed)
   Telephone encounter was:  Successful.  10/21/2022 Name: Austin [name] MRN: [id number] DOB: 1935/08/02  Austin [name] is a 88 y.o. year old male who is a primary care patient of Dorothyann Peng, NP . The community resource team was consulted for assistance with Transportation Needs   Care guide performed the following interventions: Patient provided with information about care guide support team and interviewed to confirm resource needs.  Patient needed to book transportation for 10/25/2022 again gave him the number to [place] transportation   Follow Up Plan:  No further follow up planned at this time. The patient has been provided with needed resources. [place] [phone] [address] , [place] [address] Email : [email or web address]

## 2022-10-24 ENCOUNTER — Ambulatory Visit: Payer: Medicare PPO | Admitting: Podiatry

## 2022-10-25 ENCOUNTER — Inpatient Hospital Stay (HOSPITAL_COMMUNITY): Payer: Medicare PPO

## 2022-10-25 ENCOUNTER — Inpatient Hospital Stay (HOSPITAL_COMMUNITY)
Admission: RE | Admit: 2022-10-25 | Discharge: 2022-10-25 | Disposition: A | Discharge status: Home / Self Care | Payer: Medicare PPO | Source: Ambulatory Visit | Attending: Nephrology | Admitting: Nephrology

## 2022-10-25 ENCOUNTER — Encounter (HOSPITAL_COMMUNITY): Payer: Self-pay

## 2022-10-25 ENCOUNTER — Other Ambulatory Visit: Payer: Self-pay

## 2022-10-25 ENCOUNTER — Emergency Department (HOSPITAL_COMMUNITY): Payer: Medicare PPO

## 2022-10-25 ENCOUNTER — Inpatient Hospital Stay (HOSPITAL_COMMUNITY)
Admission: EM | Admit: 2022-10-25 | Discharge: 2022-11-05 | DRG: 177 | Disposition: A | Discharge status: Skilled Nursing Facility | Payer: Medicare PPO | Attending: Internal Medicine | Admitting: Internal Medicine

## 2022-10-25 DIAGNOSIS — Z8546 Personal history of malignant neoplasm of prostate: Secondary | ICD-10-CM

## 2022-10-25 DIAGNOSIS — Z9079 Acquired absence of other genital organ(s): Secondary | ICD-10-CM

## 2022-10-25 DIAGNOSIS — Z751 Person awaiting admission to adequate facility elsewhere: Secondary | ICD-10-CM

## 2022-10-25 DIAGNOSIS — E872 Acidosis, unspecified: Secondary | ICD-10-CM | POA: Diagnosis not present

## 2022-10-25 DIAGNOSIS — T82534A Leakage of infusion catheter, initial encounter: Secondary | ICD-10-CM | POA: Diagnosis not present

## 2022-10-25 DIAGNOSIS — J189 Pneumonia, unspecified organism: Secondary | ICD-10-CM

## 2022-10-25 DIAGNOSIS — M6281 Muscle weakness (generalized): Secondary | ICD-10-CM | POA: Diagnosis not present

## 2022-10-25 DIAGNOSIS — E43 Unspecified severe protein-calorie malnutrition: Secondary | ICD-10-CM | POA: Diagnosis not present

## 2022-10-25 DIAGNOSIS — Z833 Family history of diabetes mellitus: Secondary | ICD-10-CM

## 2022-10-25 DIAGNOSIS — I272 Pulmonary hypertension, unspecified: Secondary | ICD-10-CM | POA: Diagnosis present

## 2022-10-25 DIAGNOSIS — D631 Anemia in chronic kidney disease: Secondary | ICD-10-CM | POA: Diagnosis not present

## 2022-10-25 DIAGNOSIS — G4700 Insomnia, unspecified: Secondary | ICD-10-CM | POA: Diagnosis present

## 2022-10-25 DIAGNOSIS — R509 Fever, unspecified: Secondary | ICD-10-CM | POA: Diagnosis not present

## 2022-10-25 DIAGNOSIS — Z741 Need for assistance with personal care: Secondary | ICD-10-CM | POA: Diagnosis not present

## 2022-10-25 DIAGNOSIS — D500 Iron deficiency anemia secondary to blood loss (chronic): Secondary | ICD-10-CM | POA: Diagnosis present

## 2022-10-25 DIAGNOSIS — Z7401 Bed confinement status: Secondary | ICD-10-CM | POA: Diagnosis not present

## 2022-10-25 DIAGNOSIS — N179 Acute kidney failure, unspecified: Secondary | ICD-10-CM | POA: Diagnosis not present

## 2022-10-25 DIAGNOSIS — I351 Nonrheumatic aortic (valve) insufficiency: Secondary | ICD-10-CM | POA: Diagnosis present

## 2022-10-25 DIAGNOSIS — G4733 Obstructive sleep apnea (adult) (pediatric): Secondary | ICD-10-CM | POA: Diagnosis present

## 2022-10-25 DIAGNOSIS — E875 Hyperkalemia: Secondary | ICD-10-CM

## 2022-10-25 DIAGNOSIS — I5042 Chronic combined systolic (congestive) and diastolic (congestive) heart failure: Secondary | ICD-10-CM | POA: Diagnosis present

## 2022-10-25 DIAGNOSIS — R5383 Other fatigue: Secondary | ICD-10-CM | POA: Diagnosis not present

## 2022-10-25 DIAGNOSIS — N185 Chronic kidney disease, stage 5: Secondary | ICD-10-CM | POA: Insufficient documentation

## 2022-10-25 DIAGNOSIS — Z8616 Personal history of COVID-19: Secondary | ICD-10-CM

## 2022-10-25 DIAGNOSIS — U071 COVID-19: Principal | ICD-10-CM

## 2022-10-25 DIAGNOSIS — E785 Hyperlipidemia, unspecified: Secondary | ICD-10-CM | POA: Diagnosis present

## 2022-10-25 DIAGNOSIS — Z6826 Body mass index (BMI) 26.0-26.9, adult: Secondary | ICD-10-CM

## 2022-10-25 DIAGNOSIS — N189 Chronic kidney disease, unspecified: Secondary | ICD-10-CM | POA: Diagnosis present

## 2022-10-25 DIAGNOSIS — J208 Acute bronchitis due to other specified organisms: Secondary | ICD-10-CM | POA: Diagnosis present

## 2022-10-25 DIAGNOSIS — K219 Gastro-esophageal reflux disease without esophagitis: Secondary | ICD-10-CM | POA: Diagnosis present

## 2022-10-25 DIAGNOSIS — R531 Weakness: Secondary | ICD-10-CM | POA: Diagnosis not present

## 2022-10-25 DIAGNOSIS — W19XXXA Unspecified fall, initial encounter: Secondary | ICD-10-CM | POA: Diagnosis not present

## 2022-10-25 DIAGNOSIS — J9601 Acute respiratory failure with hypoxia: Secondary | ICD-10-CM | POA: Diagnosis not present

## 2022-10-25 DIAGNOSIS — I120 Hypertensive chronic kidney disease with stage 5 chronic kidney disease or end stage renal disease: Secondary | ICD-10-CM | POA: Diagnosis not present

## 2022-10-25 DIAGNOSIS — Z8249 Family history of ischemic heart disease and other diseases of the circulatory system: Secondary | ICD-10-CM

## 2022-10-25 DIAGNOSIS — G9340 Encephalopathy, unspecified: Secondary | ICD-10-CM | POA: Diagnosis not present

## 2022-10-25 DIAGNOSIS — Z7902 Long term (current) use of antithrombotics/antiplatelets: Secondary | ICD-10-CM

## 2022-10-25 DIAGNOSIS — I132 Hypertensive heart and chronic kidney disease with heart failure and with stage 5 chronic kidney disease, or end stage renal disease: Secondary | ICD-10-CM | POA: Diagnosis present

## 2022-10-25 DIAGNOSIS — D649 Anemia, unspecified: Secondary | ICD-10-CM | POA: Diagnosis not present

## 2022-10-25 DIAGNOSIS — Z886 Allergy status to analgesic agent status: Secondary | ICD-10-CM

## 2022-10-25 DIAGNOSIS — Z4901 Encounter for fitting and adjustment of extracorporeal dialysis catheter: Secondary | ICD-10-CM | POA: Diagnosis not present

## 2022-10-25 DIAGNOSIS — N186 End stage renal disease: Secondary | ICD-10-CM | POA: Diagnosis not present

## 2022-10-25 DIAGNOSIS — E1169 Type 2 diabetes mellitus with other specified complication: Secondary | ICD-10-CM | POA: Diagnosis present

## 2022-10-25 DIAGNOSIS — Y838 Other surgical procedures as the cause of abnormal reaction of the patient, or of later complication, without mention of misadventure at the time of the procedure: Secondary | ICD-10-CM | POA: Diagnosis not present

## 2022-10-25 DIAGNOSIS — R262 Difficulty in walking, not elsewhere classified: Secondary | ICD-10-CM | POA: Diagnosis not present

## 2022-10-25 DIAGNOSIS — G9341 Metabolic encephalopathy: Secondary | ICD-10-CM | POA: Diagnosis present

## 2022-10-25 DIAGNOSIS — I2510 Atherosclerotic heart disease of native coronary artery without angina pectoris: Secondary | ICD-10-CM | POA: Insufficient documentation

## 2022-10-25 DIAGNOSIS — Z79899 Other long term (current) drug therapy: Secondary | ICD-10-CM

## 2022-10-25 DIAGNOSIS — E1122 Type 2 diabetes mellitus with diabetic chronic kidney disease: Secondary | ICD-10-CM | POA: Diagnosis not present

## 2022-10-25 DIAGNOSIS — I10 Essential (primary) hypertension: Secondary | ICD-10-CM | POA: Diagnosis present

## 2022-10-25 DIAGNOSIS — R4182 Altered mental status, unspecified: Secondary | ICD-10-CM | POA: Diagnosis not present

## 2022-10-25 DIAGNOSIS — J1282 Pneumonia due to coronavirus disease 2019: Secondary | ICD-10-CM | POA: Diagnosis not present

## 2022-10-25 DIAGNOSIS — R2681 Unsteadiness on feet: Secondary | ICD-10-CM | POA: Diagnosis not present

## 2022-10-25 DIAGNOSIS — I5043 Acute on chronic combined systolic (congestive) and diastolic (congestive) heart failure: Secondary | ICD-10-CM | POA: Diagnosis not present

## 2022-10-25 DIAGNOSIS — Z87891 Personal history of nicotine dependence: Secondary | ICD-10-CM

## 2022-10-25 DIAGNOSIS — Z992 Dependence on renal dialysis: Secondary | ICD-10-CM

## 2022-10-25 DIAGNOSIS — K5900 Constipation, unspecified: Secondary | ICD-10-CM | POA: Diagnosis present

## 2022-10-25 DIAGNOSIS — R7989 Other specified abnormal findings of blood chemistry: Secondary | ICD-10-CM | POA: Diagnosis not present

## 2022-10-25 HISTORY — PX: IR US GUIDE VASC ACCESS RIGHT: IMG2390

## 2022-10-25 HISTORY — PX: IR FLUORO GUIDE CV LINE RIGHT: IMG2283

## 2022-10-25 LAB — RESP PANEL BY RT-PCR (RSV, FLU A&B, COVID)  RVPGX2
Influenza A by PCR: NEGATIVE
Influenza B by PCR: NEGATIVE
Resp Syncytial Virus by PCR: NEGATIVE
SARS Coronavirus 2 by RT PCR: POSITIVE — AB

## 2022-10-25 LAB — C-REACTIVE PROTEIN: CRP: 8.9 mg/dL — ABNORMAL HIGH (ref <1.0)

## 2022-10-25 LAB — CBC WITH DIFFERENTIAL/PLATELET
Abs Immature Granulocytes: 0.02 10*3/uL (ref 0.00–0.07)
Basophils Absolute: 0 10*3/uL (ref 0.0–0.1)
Basophils Relative: 0 %
Eosinophils Absolute: 0 10*3/uL (ref 0.0–0.5)
Eosinophils Relative: 0 %
HCT: 25.6 % — ABNORMAL LOW (ref 39.0–52.0)
Hemoglobin: 8.7 g/dL — ABNORMAL LOW (ref 13.0–17.0)
Immature Granulocytes: 0 %
Lymphocytes Relative: 2 %
Lymphs Abs: 0.1 10*3/uL — ABNORMAL LOW (ref 0.7–4.0)
MCH: 28 pg (ref 26.0–34.0)
MCHC: 34 g/dL (ref 30.0–36.0)
MCV: 82.3 fL (ref 80.0–100.0)
Monocytes Absolute: 0.4 10*3/uL (ref 0.1–1.0)
Monocytes Relative: 6 %
Neutro Abs: 6.8 10*3/uL (ref 1.7–7.7)
Neutrophils Relative %: 92 %
Platelets: 325 10*3/uL (ref 150–400)
RBC: 3.11 MIL/uL — ABNORMAL LOW (ref 4.22–5.81)
RDW: 15.4 % (ref 11.5–15.5)
WBC: 7.4 10*3/uL (ref 4.0–10.5)
nRBC: 0 % (ref 0.0–0.2)

## 2022-10-25 LAB — CBC
HCT: 24.9 % — ABNORMAL LOW (ref 39.0–52.0)
Hemoglobin: 8 g/dL — ABNORMAL LOW (ref 13.0–17.0)
MCH: 27.4 pg (ref 26.0–34.0)
MCHC: 32.1 g/dL (ref 30.0–36.0)
MCV: 85.3 fL (ref 80.0–100.0)
Platelets: 187 10*3/uL (ref 150–400)
RBC: 2.92 MIL/uL — ABNORMAL LOW (ref 4.22–5.81)
RDW: 14.7 % (ref 11.5–15.5)
WBC: 5.4 10*3/uL (ref 4.0–10.5)
nRBC: 0 % (ref 0.0–0.2)

## 2022-10-25 LAB — BASIC METABOLIC PANEL
Anion gap: 21 — ABNORMAL HIGH (ref 5–15)
BUN: 153 mg/dL — ABNORMAL HIGH (ref 8–23)
CO2: 13 mmol/L — ABNORMAL LOW (ref 22–32)
Calcium: 8.7 mg/dL — ABNORMAL LOW (ref 8.9–10.3)
Chloride: 111 mmol/L (ref 98–111)
Creatinine, Ser: 7.61 mg/dL — ABNORMAL HIGH (ref 0.61–1.24)
GFR, Estimated: 6 mL/min — ABNORMAL LOW (ref >60)
Glucose, Bld: 166 mg/dL — ABNORMAL HIGH (ref 70–99)
Potassium: 3.5 mmol/L (ref 3.5–5.1)
Sodium: 145 mmol/L (ref 135–145)

## 2022-10-25 LAB — COMPREHENSIVE METABOLIC PANEL
ALT: 6 U/L (ref 0–44)
AST: 26 U/L (ref 15–41)
Albumin: 2.9 g/dL — ABNORMAL LOW (ref 3.5–5.0)
Alkaline Phosphatase: 42 U/L (ref 38–126)
Anion gap: 14 (ref 5–15)
BUN: 147 mg/dL — ABNORMAL HIGH (ref 8–23)
CO2: 18 mmol/L — ABNORMAL LOW (ref 22–32)
Calcium: 8.6 mg/dL — ABNORMAL LOW (ref 8.9–10.3)
Chloride: 110 mmol/L (ref 98–111)
Creatinine, Ser: 7.45 mg/dL — ABNORMAL HIGH (ref 0.61–1.24)
GFR, Estimated: 7 mL/min — ABNORMAL LOW (ref >60)
Glucose, Bld: 167 mg/dL — ABNORMAL HIGH (ref 70–99)
Potassium: 3.7 mmol/L (ref 3.5–5.1)
Sodium: 142 mmol/L (ref 135–145)
Total Bilirubin: 0.6 mg/dL (ref 0.3–1.2)
Total Protein: 6.5 g/dL (ref 6.5–8.1)

## 2022-10-25 LAB — I-STAT CHEM 8, ED
BUN: >130 mg/dL — ABNORMAL HIGH (ref 8–23)
Calcium, Ion: 1 mmol/L — ABNORMAL LOW (ref 1.15–1.40)
Chloride: 112 mmol/L — ABNORMAL HIGH (ref 98–111)
Creatinine, Ser: 8.9 mg/dL — ABNORMAL HIGH (ref 0.61–1.24)
Glucose, Bld: 159 mg/dL — ABNORMAL HIGH (ref 70–99)
HCT: 25 % — ABNORMAL LOW (ref 39.0–52.0)
Hemoglobin: 8.5 g/dL — ABNORMAL LOW (ref 13.0–17.0)
Potassium: 6.5 mmol/L (ref 3.5–5.1)
Sodium: 141 mmol/L (ref 135–145)
TCO2: 19 mmol/L — ABNORMAL LOW (ref 22–32)

## 2022-10-25 LAB — GLUCOSE, CAPILLARY
Glucose-Capillary: 189 mg/dL — ABNORMAL HIGH (ref 70–99)
Glucose-Capillary: 190 mg/dL — ABNORMAL HIGH (ref 70–99)
Glucose-Capillary: 213 mg/dL — ABNORMAL HIGH (ref 70–99)

## 2022-10-25 LAB — PROTIME-INR
INR: 1.2 (ref 0.8–1.2)
Prothrombin Time: 15.2 seconds (ref 11.4–15.2)

## 2022-10-25 LAB — CBG MONITORING, ED: Glucose-Capillary: 143 mg/dL — ABNORMAL HIGH (ref 70–99)

## 2022-10-25 LAB — D-DIMER, QUANTITATIVE: D-Dimer, Quant: 3.5 ug/mL-FEU — ABNORMAL HIGH (ref 0.00–0.50)

## 2022-10-25 LAB — LACTIC ACID, PLASMA: Lactic Acid, Venous: 1.5 mmol/L (ref 0.5–1.9)

## 2022-10-25 LAB — CREATININE, SERUM
Creatinine, Ser: 7.71 mg/dL — ABNORMAL HIGH (ref 0.61–1.24)
GFR, Estimated: 6 mL/min — ABNORMAL LOW (ref >60)

## 2022-10-25 LAB — APTT: aPTT: 35 seconds (ref 24–36)

## 2022-10-25 LAB — BRAIN NATRIURETIC PEPTIDE: B Natriuretic Peptide: 2179.9 pg/mL — ABNORMAL HIGH (ref 0.0–100.0)

## 2022-10-25 LAB — HEPATITIS B SURFACE ANTIGEN: Hepatitis B Surface Ag: NONREACTIVE

## 2022-10-25 LAB — LACTATE DEHYDROGENASE: LDH: 209 U/L — ABNORMAL HIGH (ref 98–192)

## 2022-10-25 MED ORDER — ALBUTEROL SULFATE (2.5 MG/3ML) 0.083% IN NEBU
10.0000 mg | INHALATION_SOLUTION | Freq: Once | RESPIRATORY_TRACT | Status: AC
  Administered 2022-10-25: 10 mg via RESPIRATORY_TRACT
  Filled 2022-10-25: qty 12

## 2022-10-25 MED ORDER — DEXAMETHASONE SODIUM PHOSPHATE 10 MG/ML IJ SOLN
6.0000 mg | INTRAMUSCULAR | Status: AC
  Administered 2022-10-25 – 2022-11-03 (×9): 6 mg via INTRAVENOUS
  Filled 2022-10-25 (×2): qty 1
  Filled 2022-10-25: qty 0.6
  Filled 2022-10-25 (×2): qty 1
  Filled 2022-10-25: qty 0.6
  Filled 2022-10-25: qty 1
  Filled 2022-10-25: qty 0.6
  Filled 2022-10-25: qty 1

## 2022-10-25 MED ORDER — SODIUM BICARBONATE 8.4 % IV SOLN
50.0000 meq | Freq: Once | INTRAVENOUS | Status: AC
  Administered 2022-10-25: 50 meq via INTRAVENOUS
  Filled 2022-10-25: qty 50

## 2022-10-25 MED ORDER — ACETAMINOPHEN 325 MG PO TABS
650.0000 mg | ORAL_TABLET | Freq: Four times a day (QID) | ORAL | Status: DC | PRN
  Administered 2022-10-28 – 2022-10-30 (×3): 650 mg via ORAL
  Filled 2022-10-25 (×3): qty 2

## 2022-10-25 MED ORDER — HEPARIN SODIUM (PORCINE) 5000 UNIT/ML IJ SOLN
5000.0000 [IU] | Freq: Three times a day (TID) | INTRAMUSCULAR | Status: DC
  Administered 2022-10-25: 5000 [IU] via SUBCUTANEOUS
  Filled 2022-10-25: qty 1

## 2022-10-25 MED ORDER — ONDANSETRON HCL 4 MG/2ML IJ SOLN
4.0000 mg | Freq: Four times a day (QID) | INTRAMUSCULAR | Status: DC | PRN
  Filled 2022-10-25: qty 2

## 2022-10-25 MED ORDER — ADULT MULTIVITAMIN W/MINERALS CH
1.0000 | ORAL_TABLET | Freq: Every day | ORAL | Status: DC
  Administered 2022-10-25 – 2022-10-28 (×4): 1 via ORAL
  Filled 2022-10-25 (×4): qty 1

## 2022-10-25 MED ORDER — VITAMIN D 25 MCG (1000 UNIT) PO TABS
1000.0000 [IU] | ORAL_TABLET | Freq: Every day | ORAL | Status: DC
  Administered 2022-10-25 – 2022-11-04 (×11): 1000 [IU] via ORAL
  Filled 2022-10-25 (×11): qty 1

## 2022-10-25 MED ORDER — ONDANSETRON HCL 4 MG PO TABS: 4.0000 mg | ORAL_TABLET | Freq: Four times a day (QID) | ORAL | Status: DC | PRN

## 2022-10-25 MED ORDER — ACETAMINOPHEN 650 MG RE SUPP: 650.0000 mg | Freq: Four times a day (QID) | RECTAL | Status: DC | PRN

## 2022-10-25 MED ORDER — SODIUM CHLORIDE 0.9 % IV SOLN
1.0000 g | Freq: Once | INTRAVENOUS | Status: AC
  Administered 2022-10-25: 1 g via INTRAVENOUS
  Filled 2022-10-25: qty 10

## 2022-10-25 MED ORDER — SODIUM ZIRCONIUM CYCLOSILICATE 5 G PO PACK
5.0000 g | PACK | Freq: Three times a day (TID) | ORAL | Status: DC
  Filled 2022-10-25 (×2): qty 1

## 2022-10-25 MED ORDER — CHLORHEXIDINE GLUCONATE CLOTH 2 % EX PADS
6.0000 | MEDICATED_PAD | Freq: Every day | CUTANEOUS | Status: DC
  Administered 2022-10-25 – 2022-11-03 (×7): 6 via TOPICAL

## 2022-10-25 MED ORDER — CLOPIDOGREL BISULFATE 75 MG PO TABS
75.0000 mg | ORAL_TABLET | Freq: Every day | ORAL | Status: DC
  Administered 2022-10-25 – 2022-11-04 (×11): 75 mg via ORAL
  Filled 2022-10-25 (×11): qty 1

## 2022-10-25 MED ORDER — ISOSORB DINITRATE-HYDRALAZINE 20-37.5 MG PO TABS
1.0000 | ORAL_TABLET | Freq: Three times a day (TID) | ORAL | Status: DC
  Administered 2022-10-25 – 2022-11-04 (×28): 1 via ORAL
  Filled 2022-10-25 (×34): qty 1

## 2022-10-25 MED ORDER — SODIUM CHLORIDE 0.9 % IV SOLN
500.0000 mg | Freq: Once | INTRAVENOUS | Status: AC
  Administered 2022-10-25: 500 mg via INTRAVENOUS
  Filled 2022-10-25: qty 5

## 2022-10-25 MED ORDER — SODIUM ZIRCONIUM CYCLOSILICATE 10 G PO PACK
10.0000 g | PACK | Freq: Once | ORAL | Status: DC
  Filled 2022-10-25: qty 1

## 2022-10-25 MED ORDER — HEPARIN SODIUM (PORCINE) 1000 UNIT/ML IJ SOLN
INTRAMUSCULAR | Status: AC
  Filled 2022-10-25: qty 10

## 2022-10-25 MED ORDER — LIDOCAINE-EPINEPHRINE 1 %-1:100000 IJ SOLN
INTRAMUSCULAR | Status: AC
  Filled 2022-10-25: qty 1

## 2022-10-25 MED ORDER — INSULIN ASPART 100 UNIT/ML IJ SOLN
0.0000 [IU] | Freq: Every day | INTRAMUSCULAR | Status: DC
  Administered 2022-10-25: 2 [IU] via SUBCUTANEOUS

## 2022-10-25 MED ORDER — FOLIC ACID 1 MG PO TABS
1.0000 mg | ORAL_TABLET | Freq: Every day | ORAL | Status: DC
  Administered 2022-10-25 – 2022-11-04 (×11): 1 mg via ORAL
  Filled 2022-10-25 (×11): qty 1

## 2022-10-25 MED ORDER — PANTOPRAZOLE SODIUM 40 MG PO TBEC
40.0000 mg | DELAYED_RELEASE_TABLET | Freq: Every day | ORAL | Status: DC
  Administered 2022-10-25 – 2022-11-04 (×11): 40 mg via ORAL
  Filled 2022-10-25 (×11): qty 1

## 2022-10-25 MED ORDER — FUROSEMIDE 10 MG/ML IJ SOLN
40.0000 mg | Freq: Once | INTRAMUSCULAR | Status: AC
  Administered 2022-10-25: 40 mg via INTRAVENOUS
  Filled 2022-10-25: qty 4

## 2022-10-25 MED ORDER — AMLODIPINE BESYLATE 10 MG PO TABS
10.0000 mg | ORAL_TABLET | Freq: Every day | ORAL | Status: DC
  Administered 2022-10-25 – 2022-10-31 (×6): 10 mg via ORAL
  Filled 2022-10-25 (×6): qty 1

## 2022-10-25 MED ORDER — CLONIDINE HCL 0.1 MG PO TABS
0.3000 mg | ORAL_TABLET | Freq: Two times a day (BID) | ORAL | Status: DC
  Administered 2022-10-25 – 2022-10-31 (×13): 0.3 mg via ORAL
  Filled 2022-10-25 (×13): qty 3

## 2022-10-25 MED ORDER — HEPARIN BOLUS VIA INFUSION
4800.0000 [IU] | Freq: Once | INTRAVENOUS | Status: AC
  Administered 2022-10-25: 4800 [IU] via INTRAVENOUS
  Filled 2022-10-25: qty 4800

## 2022-10-25 MED ORDER — ATORVASTATIN CALCIUM 10 MG PO TABS
20.0000 mg | ORAL_TABLET | Freq: Every day | ORAL | Status: DC
  Administered 2022-10-25 – 2022-11-04 (×11): 20 mg via ORAL
  Filled 2022-10-25 (×11): qty 2

## 2022-10-25 MED ORDER — INSULIN ASPART 100 UNIT/ML IJ SOLN
0.0000 [IU] | Freq: Three times a day (TID) | INTRAMUSCULAR | Status: DC
  Administered 2022-10-25 – 2022-10-26 (×3): 1 [IU] via SUBCUTANEOUS
  Administered 2022-10-26: 3 [IU] via SUBCUTANEOUS

## 2022-10-25 MED ORDER — SODIUM CHLORIDE 0.9% FLUSH
3.0000 mL | Freq: Two times a day (BID) | INTRAVENOUS | Status: DC
  Administered 2022-10-25 – 2022-11-04 (×19): 3 mL via INTRAVENOUS

## 2022-10-25 MED ORDER — DOXAZOSIN MESYLATE 2 MG PO TABS
2.0000 mg | ORAL_TABLET | Freq: Every day | ORAL | Status: DC
  Administered 2022-10-25 – 2022-11-04 (×11): 2 mg via ORAL
  Filled 2022-10-25 (×12): qty 1

## 2022-10-25 MED ORDER — HEPARIN (PORCINE) 25000 UT/250ML-% IV SOLN
1150.0000 [IU]/h | INTRAVENOUS | Status: DC
  Administered 2022-10-25 – 2022-10-26 (×2): 1300 [IU]/h via INTRAVENOUS
  Filled 2022-10-25 (×2): qty 250

## 2022-10-25 NOTE — Procedures (Signed)
Interventional Radiology Procedure Note  Procedure: Temporary hemodialysis catheter placement  Findings: Please refer to procedural dictation for full description. 20 cm Trialysis via right IJ.  Tip in right atrium.  Complications: None immediate  Estimated Blood Loss: < 5 mL  Recommendations: Catheter ready for immediate use.   Ruthann Cancer, MD

## 2022-10-25 NOTE — ED Notes (Addendum)
ED TO INPATIENT HANDOFF REPORT  ED Nurse Name and Phone #: Overton Mam [phone]  S Name/Age/Gender Austin [name] 88 y.o. male Room/Bed: 009C/009C  Code Status   Code Status: Full Code  Home/SNF/Other Home Patient oriented to: self, place, time, and situation Is this baseline? Yes   Triage Complete: Triage complete  Chief Complaint Acute respiratory failure with hypoxia ([place]) [J96.01]  Triage Note Pt BIB [facility] d/t weakness for 1 week and "Not acting himself".  Pt had 102 oral temp.  [facility] gave 650 mg Tylenol in route.  Pt had Fistula placed 2/27 in Left arm.   Allergies Allergies  Allergen Reactions   Aspirin Other (See Comments)    High doses causes stomach ulcer and bleeding    Level of Care/Admitting Diagnosis ED Disposition     ED Disposition  Admit   Condition  --   [place]: [place] [100100]  Level of Care: Progressive [102]  Admit to Progressive based on following criteria: RESPIRATORY PROBLEMS hypoxemic/hypercapnic respiratory failure that is responsive to NIPPV (BiPAP) or High Flow Nasal Cannula (6-80 lpm). Frequent assessment/intervention, no > Q2 hrs < Q4 hrs, to maintain oxygenation and pulmonary hygiene.  May admit patient to [facility] or [facility] if equivalent level of care is available:: No  Covid Evaluation: Confirmed COVID Positive  Diagnosis: Acute respiratory failure with hypoxia [facility]) [id number]  Admitting Physician: [place], [place]  Attending Physician: Samuella Cota [id number]  Certification:: I certify this patient will need inpatient services for at least 2 midnights  Estimated Length of Stay: 5          B Medical/Surgery History Past Medical History:  Diagnosis Date   Acute diastolic CHF (congestive heart failure) ([place]) 09/17/2020   Acute exacerbation of CHF (congestive heart failure) ([place]) 08/04/2019   ANEMIA DUE TO CHRONIC BLOOD LOSS 03/13/2007   CAROTID ARTERY STENOSIS 05/10/2010   CHF  (congestive heart failure) ([place])    DIABETES MELLITUS, TYPE II 09/19/2007   DISEASE, 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 10, 2001   ESOPHAGOGASTRODUODENOSCOPY (EGD) WITH PROPOFOL N/A 11/04/2016   Procedure: ESOPHAGOGASTRODUODENOSCOPY (EGD) WITH PROPOFOL;  Surgeon: Otis Brace, MD;  Location: [facility];  Service: Gastroenterology;  Laterality: N/A;   LAPAROSCOPIC APPENDECTOMY  02/20/2012   Procedure: APPENDECTOMY LAPAROSCOPIC;  Surgeon: Stark Klein, MD;  Location: [facility];  Service: General;  Laterality: N/A;   PROSTATE SURGERY     prostatectomy     A IV Location/Drains/Wounds Patient Lines/Drains/Airways Status     Active Line/Drains/Airways     Name Placement date Placement time Site Days   Peripheral IV 10/25/22 20 G 1" Anterior;Distal;Right;Upper Arm 10/25/22  0510  Arm  less than 1   Peripheral IV 10/25/22 20 G Anterior;Right Hand 10/25/22  0617  Hand  less than 1   Fistula / Graft Left Upper arm Arteriovenous fistula 10/18/22  1045  Upper arm  7            Intake/Output  Last 24 hours No intake or output data in the 24 hours ending 10/25/22 0758  Labs/Imaging Results for orders placed or performed during the hospital encounter of 10/25/22 (from the past 48 hour(s))  Resp panel by RT-PCR (RSV, Flu A&B, Covid) Anterior Nasal Swab     Status: Abnormal   Collection Time: 10/25/22  5:46 AM   Specimen: Anterior Nasal Swab  Result Value Ref Range   SARS  Coronavirus 2 by RT PCR POSITIVE (A) NEGATIVE   Influenza A by PCR NEGATIVE NEGATIVE   Influenza B by PCR NEGATIVE NEGATIVE    Comment: (NOTE) The Xpert Xpress SARS-CoV-2/FLU/RSV plus assay is intended as an aid in the diagnosis of influenza from Nasopharyngeal swab specimens and should not be used as a sole basis for treatment. Nasal washings and aspirates are unacceptable for Xpert Xpress SARS-CoV-2/FLU/RSV testing.  Fact Sheet for Patients: EntrepreneurPulse.com.au  Fact Sheet for Healthcare Providers: IncredibleEmployment.be  This test is not yet approved or cleared by the [place] FDA and has been authorized for detection and/or diagnosis of SARS-CoV-2 by FDA under an Emergency Use Authorization (EUA). This EUA will remain in effect (meaning this test can be used) for the duration of the COVID-19 declaration under Section 564(b)(1) of the Act, 21 U.S.C. section 360bbb-3(b)(1), unless the authorization is terminated or revoked.     Resp Syncytial Virus by PCR NEGATIVE NEGATIVE    Comment: (NOTE) Fact Sheet for Patients: EntrepreneurPulse.com.au  Fact Sheet for Healthcare Providers: IncredibleEmployment.be  This test is not yet approved or cleared by the [place] FDA and has been authorized for detection and/or diagnosis of SARS-CoV-2 by FDA under an Emergency Use Authorization (EUA). This EUA will remain in effect (meaning this test can be used) for the duration of the COVID-19 declaration under Section 564(b)(1) of the Act, 21 U.S.C. section 360bbb-3(b)(1), unless the authorization is terminated or revoked.  Performed at [facility], [place] [address] [place], [place] [address]   Lactic acid, plasma     Status: None   Collection Time: 10/25/22  5:55 AM  Result Value Ref Range   Lactic Acid, Venous 1.5 0.5 - 1.9 mmol/L    Comment: Performed at [facility] [address], [place], [place] [address]  CBC with Differential     Status: Abnormal   Collection Time: 10/25/22  5:55 AM  Result Value Ref Range   WBC 7.4 4.0 - 10.5 K/uL   RBC 3.11 (L) 4.22 - 5.81 MIL/uL   Hemoglobin 8.7 (L) 13.0 - 17.0 g/dL   HCT 25.6 (L) 39.0 - 52.0 %   MCV 82.3 80.0 - 100.0 fL   MCH 28.0 26.0 - 34.0 pg   MCHC 34.0 30.0 - 36.0 g/dL   RDW 15.4 11.5 - 15.5 %   Platelets 325 150 - 400 K/uL   nRBC 0.0 0.0 - 0.2 %   Neutrophils Relative % 92 %   Neutro Abs 6.8 1.7 - 7.7 K/uL   Lymphocytes Relative 2 %   Lymphs Abs 0.1 (L) 0.7 - 4.0 K/uL   Monocytes Relative 6 %   Monocytes Absolute 0.4 0.1 - 1.0 K/uL   Eosinophils Relative 0 %   Eosinophils Absolute 0.0 0.0 - 0.5 K/uL   Basophils Relative 0 %   Basophils Absolute 0.0 0.0 - 0.1 K/uL   Immature Granulocytes 0 %   Abs Immature Granulocytes 0.02 0.00 - 0.07 K/uL    Comment: Performed at [facility], [place]  [address] [place], [place] [address]  Protime-INR     Status: None   Collection Time: 10/25/22  5:55 AM  Result Value Ref Range   Prothrombin Time 15.2 11.4 - 15.2 seconds   INR 1.2 0.8 - 1.2    Comment: (NOTE) INR goal varies based on device and disease states. Performed at [facility], [place] [address], [place], [place] [address]   APTT     Status: None   Collection Time: 10/25/22  5:55 AM  Result Value Ref Range   aPTT 35 24 - 36 seconds    Comment: Performed at [facility] [address], [place], [place] [address]  Comprehensive metabolic panel     Status: Abnormal   Collection Time: 10/25/22  6:45 AM  Result Value Ref Range   Sodium 142 135 - 145 mmol/L   Potassium 3.7 3.5 - 5.1 mmol/L   Chloride 110 98 - 111 mmol/L   CO2 18 (L) 22 - 32 mmol/L   Glucose, Bld 167 (H) 70 - 99 mg/dL    Comment: Glucose reference range applies only to samples taken after fasting for at least 8 hours.   BUN 147 (H) 8 - 23 mg/dL   Creatinine, Ser 7.45 (H) 0.61 - 1.24 mg/dL   Calcium 8.6 (L) 8.9 - 10.3 mg/dL    Total Protein 6.5 6.5 - 8.1 g/dL   Albumin 2.9 (L) 3.5 - 5.0 g/dL   AST 26 15 - 41 U/L   ALT 6 0 - 44 U/L   Alkaline Phosphatase 42 38 - 126 U/L   Total Bilirubin 0.6 0.3 - 1.2 mg/dL   GFR, Estimated 7 (L) >60 mL/min    Comment: (NOTE) Calculated using the CKD-EPI Creatinine Equation (2021)    Anion gap 14 5 - 15    Comment: Performed at [facility], [place] [address] [place], [place] [address]  I-stat chem 8, ED (not at [facility], [facility] or [facility])     Status: Abnormal   Collection Time: 10/25/22  6:51 AM  Result Value Ref Range   Sodium 141 135 - 145 mmol/L   Potassium 6.5 (HH) 3.5 - 5.1 mmol/L   Chloride 112 (H) 98 - 111 mmol/L   BUN >130 (H) 8 - 23 mg/dL   Creatinine, Ser 8.90 (H) 0.61 - 1.24 mg/dL   Glucose, Bld 159 (H) 70 - 99 mg/dL    Comment: Glucose reference range applies only to samples taken after fasting for at least 8 hours.   Calcium, Ion 1.00 (L) 1.15 - 1.40 mmol/L   TCO2 19 (L) 22 - 32 mmol/L   Hemoglobin 8.5 (L) 13.0 - 17.0 g/dL   HCT 25.0 (L) 39.0 - 52.0 %   Comment NOTIFIED PHYSICIAN    DG Chest Port 1 View  Result Date: 10/25/2022 CLINICAL DATA:  88 year old male with possible sepsis.  Fever. EXAM: PORTABLE CHEST 1 VIEW COMPARISON:  Chest x-ray 09/26/2021. FINDINGS: Lung volumes are normal. Widespread areas of interstitial prominence and peribronchial cuffing are noted, concerning for probable acute bronchitis. No definite confluent consolidative airspace disease. No pleural effusions. No pneumothorax. Pulmonary vasculature does not appear engorged. Heart size is mildly enlarged. Upper mediastinal contours are within normal limits allowing for patient rotation to the left. Atherosclerotic calcifications in the thoracic aorta. IMPRESSION: 1. The appearance the chest is concerning for an acute bronchitis. 2. Mild cardiomegaly. 3. Aortic atherosclerosis. Electronically Signed   By: Vinnie Langton M.D.   On: 10/25/2022 06:18  Pending Labs Unresulted Labs (From  admission, onward)     Start     Ordered   10/26/22 0500  CBC  Tomorrow morning,   R        10/25/22 0745   10/25/22 0747  Hemoglobin A1c  Once,   R       Comments: To assess prior glycemic control    10/25/22 0746   10/25/22 0744  C-reactive protein  Daily,   R      10/25/22 0743   10/25/22 0744  Lactate dehydrogenase  Daily,   R      10/25/22 0743   10/25/22 0744  D-dimer, quantitative  Daily,   R      10/25/22 0743   10/25/22 0744  CBC  (heparin)  Once,   R       Comments: Baseline for heparin therapy IF NOT ALREADY DRAWN.  Notify MD if PLT < 100 K.    10/25/22 0745   10/25/22 0744  Creatinine, serum  (heparin)  Once,   R       Comments: Baseline for heparin therapy IF NOT ALREADY DRAWN.    10/25/22 0745   10/25/22 0639  Brain natriuretic peptide  Once,   AD        10/25/22 [id number]   10/25/22 0545  Lactic acid, plasma  (Undifferentiated presentation (screening labs and basic nursing orders))  Now then every 2 hours,   R (with STAT occurrences)      10/25/22 0545   10/25/22 0545  Blood Culture (routine x 2)  (Undifferentiated presentation (screening labs and basic nursing orders))  BLOOD CULTURE X 2,   STAT      10/25/22 0545   10/25/22 0545  Urinalysis, Routine w reflex microscopic -Urine, Clean Catch  (Undifferentiated presentation (screening labs and basic nursing orders))  ONCE - URGENT,   URGENT       Question:  Specimen Source  Answer:  Urine, Clean Catch   10/25/22 0545            Vitals/Pain Today's Vitals   10/25/22 0645 10/25/22 0700 10/25/22 0715 10/25/22 0730  BP: (!) 158/62 (!) 176/67 (!) 162/62 (!) 163/68  Pulse:      Resp: '20 17 18 19  '$ Temp:      SpO2:      Weight:      Height:      PainSc:        Isolation Precautions Airborne and Contact precautions  Medications Medications  azithromycin (ZITHROMAX) 500 mg in sodium chloride 0.9 % 250 mL IVPB (500 mg Intravenous New Bag/Given 10/25/22 0741)  sodium zirconium cyclosilicate (LOKELMA) packet 5 g  (has no administration in time range)  dexamethasone (DECADRON) injection 6 mg (has no administration in time range)  folic acid (FOLVITE) tablet 1 mg (has no administration in time range)  multivitamin with minerals tablet 1 tablet (has no administration in time range)  heparin injection 5,000 Units (has no administration in time range)  sodium chloride flush (NS) 0.9 % injection 3 mL (has no administration in time range)  acetaminophen (TYLENOL) tablet 650 mg (has no administration in time range)    Or  acetaminophen (TYLENOL) suppository 650 mg (has no administration in time range)  ondansetron (ZOFRAN) tablet 4 mg (has no administration in time range)    Or  ondansetron (ZOFRAN) injection 4 mg (has no administration in time range)  insulin aspart (novoLOG) injection 0-6 Units (has no administration in time range)  insulin  aspart (novoLOG) injection 0-5 Units (has no administration in time range)  cefTRIAXone (ROCEPHIN) 1 g in sodium chloride 0.9 % 100 mL IVPB (0 g Intravenous Stopped 10/25/22 0740)  furosemide (LASIX) injection 40 mg (40 mg Intravenous Given 10/25/22 0732)  sodium bicarbonate injection 50 mEq (50 mEq Intravenous Given 10/25/22 0733)  albuterol (PROVENTIL) (2.5 MG/3ML) 0.083% nebulizer solution 10 mg (10 mg Nebulization Given 10/25/22 0728)    Mobility walks     Focused Assessments Renal Assessment Handoff:  Hemodialysis Schedule:  Last Hemodialysis date and time:    Restricted appendage: left arm   R Recommendations: See Admitting Provider Note  Report given to:   Additional Notes: Pt will be a new dialysis pt, fistula recently placed end of Feb

## 2022-10-25 NOTE — H&P (Addendum)
History and Physical    Patient: Austin [name] [id number] DOB: 11/07/1934 DOA: 10/25/2022 DOS: the patient was seen and examined on 10/25/2022 PCP: Dorothyann Peng, NP  Patient coming from: Home with wife   Chief Complaint:  Chief Complaint  Patient presents with   Fever   HPI: Austin [name] is a 88 y.o. male with medical history significant of diabetes mellitus 2, hypertension, CAD, prior GI bleed, CHF, sleep apnea, CKD 4 with impending dialysis-status post left AV graft placement on 10/18/2022.  Patient presented today ER via EMS due to weakness and reported not acting like himself.  Noted to have a temperature of 102 F by EMS and was given Tylenol.  Concerns over possible community-acquired pneumonia so patient was given Rocephin and Zithromax..  O2 sats were low at 90% so he was placed on oxygen.  Chest x-ray concerning for acute bronchitis.  On exam he appeared volume overloaded with wife noting increased swelling in both legs greater on the left over the past week.  BNP was elevated on 3/4 at 2179 in the context of underlying CKD.  Lactic acid was normal at 1.5.  Patient was positive for COVID via PCR.  White count normal.  BUN 117 with creatinine is 6.8 which is baseline.  Potassium 3.4.  Repeat electrolyte panel on 3/5 revealed a potassium of 6.5 with a BUN greater than 130 and a creatinine of 8.9.  Hospitalist consulted for admission and EDP has consulted nephrology in anticipation of likely initiation of hemodialysis.  Upon my examination of the patient patient and family report cough for 3 days, generalized weakness for at least 1 week.  This was associated with myalgias and chills.  At baseline he is independent.  No known COVID exposures.  Does not utilize oxygen at baseline.  Review of Systems: As mentioned in the history of present illness. All other systems reviewed and are negative. Past Medical History:  Diagnosis Date   Acute diastolic CHF (congestive heart failure) ([place]) 09/17/2020    Acute exacerbation of CHF (congestive heart failure) ([place]) 08/04/2019   ANEMIA DUE TO CHRONIC BLOOD LOSS 03/13/2007   CAROTID ARTERY STENOSIS 05/10/2010   CHF (congestive heart failure) ([place])    DIABETES MELLITUS, TYPE II 09/19/2007   DISEASE, CEREBROVASCULAR NEC 03/05/2007   GERD 03/13/2007   HYPERLIPIDEMIA 03/05/2007   HYPERTENSION 03/05/2007   HYPOKALEMIA 11/09/2009   KNEE PAIN, RIGHT 11/09/2009   PROSTATE CANCER, HX OF 03/05/2007   RENAL DISEASE, CHRONIC 02/03/2009   Past Surgical History:  Procedure Laterality Date   AV FISTULA PLACEMENT Left 10/18/2022   Procedure: INSERTION OF LEFT ARM BRACHIAL ARTERY TO AXILLARY VEIN ARTERIOVENOUS (AV) GORE-TEX GRAFT;  Surgeon: Broadus John, MD;  Location: [facility];  Service: Vascular;  Laterality: Left;   CAROTID ARTERY ANGIOPLASTY Right Oct. 10, 2001   ESOPHAGOGASTRODUODENOSCOPY (EGD) WITH PROPOFOL N/A 11/04/2016   Procedure: ESOPHAGOGASTRODUODENOSCOPY (EGD) WITH PROPOFOL;  Surgeon: Otis Brace, MD;  Location: [facility];  Service: Gastroenterology;  Laterality: N/A;   LAPAROSCOPIC APPENDECTOMY  02/20/2012   Procedure: APPENDECTOMY LAPAROSCOPIC;  Surgeon: Stark Klein, MD;  Location: [facility];  Service: General;  Laterality: N/A;   PROSTATE SURGERY     prostatectomy   Social History:  reports that he quit smoking about 48 years ago. His smoking use included cigarettes. He has a 10.00 pack-year smoking history. He has never used smokeless tobacco. He reports that he does not drink alcohol and does not use drugs.  Allergies  Allergen Reactions   Aspirin Other (  See Comments)    High doses causes stomach ulcer and bleeding    Family History  Problem Relation Age of Onset   Hypertension Mother    Cancer Father        Mesothelioma    Stomach cancer Brother    Cancer Brother    Cancer - Cervical Brother    Cancer Brother    Diabetes Brother    Esophageal cancer Neg Hx    Colon cancer Neg Hx    Pancreatic cancer Neg Hx     Prior  to Admission medications   Medication Sig Start Date End Date Taking? Authorizing Provider  ACCU-CHEK GUIDE test strip USE TO CHECK BLOOD GLUCOSE TWICE A DAY OR AS NEEDED 06/16/22   Nafziger, Tommi Rumps, NP  Accu-Chek Softclix Lancets lancets USED TO CHECK BLOOD GLUCOSE TWICE A DAY OR AS NEEDED 03/08/22   Nafziger, Tommi Rumps, NP  acetaminophen (TYLENOL) 500 MG tablet Take 1,000 mg by mouth 2 (two) times daily.    [provider]  albuterol (VENTOLIN HFA) 108 (90 Base) MCG/ACT inhaler TAKE 2 PUFFS BY MOUTH EVERY 6 HOURS AS NEEDED FOR WHEEZE OR SHORTNESS OF BREATH 09/13/22   Collene Gobble, MD  amLODipine (NORVASC) 10 MG tablet TAKE 1 TABLET BY MOUTH EVERY DAY 05/26/22   Nafziger, Tommi Rumps, NP  atorvastatin (LIPITOR) 20 MG tablet TAKE 1 TABLET BY MOUTH EVERY DAY 12/23/21   Nafziger, Tommi Rumps, NP  blood glucose meter kit and supplies Dispense based on patient and insurance preference. Use up to four times daily as directed. (FOR ICD-10 E10.9, E11.9). 08/17/22   Nafziger, Tommi Rumps, NP  Blood Glucose Monitoring Suppl (ACCU-CHEK AVIVA PLUS) w/Device KIT Used to check blood glucose 2 times a day or PRN Patient taking differently: in the morning and at bedtime. 09/04/19   Nafziger, Tommi Rumps, NP  cholecalciferol (VITAMIN D3) 25 MCG (1000 UNIT) tablet Take 1,000 Units by mouth daily.    [provider]  cloNIDine (CATAPRES) 0.3 MG tablet Take 1 tablet (0.3 mg total) by mouth 3 (three) times daily. Patient taking differently: Take 0.3 mg by mouth 2 (two) times daily. 09/20/20   Harold Hedge, MD  clopidogrel (PLAVIX) 75 MG tablet TAKE 1 TABLET BY MOUTH EVERY DAY 07/12/22   Nafziger, Tommi Rumps, NP  doxazosin (CARDURA) 2 MG tablet Take 2 mg by mouth at bedtime.  12/28/17   [provider]  esomeprazole (NEXIUM) 40 MG capsule TAKE 1 CAPSULE BY MOUTH EVERY DAY 03/15/22   Nafziger, Tommi Rumps, NP  eszopiclone (LUNESTA) 1 MG TABS tablet Take 1 tablet (1 mg total) by mouth at bedtime as needed for sleep. Take immediately before  bedtime Patient not taking: Reported on 10/14/2022 11/26/21   Laurin Coder, MD  guaiFENesin (MUCINEX) 600 MG 12 hr tablet Take 600 mg by mouth at bedtime.    [provider]  hydrocortisone (ANUSOL-HC) 2.5 % rectal cream Place 1 application rectally 2 (two) times daily. Patient not taking: Reported on 10/14/2022 10/12/21   Dorothyann Peng, NP  isosorbide-hydrALAZINE (BIDIL) 20-37.5 MG tablet Take 1 tablet by mouth 3 (three) times daily.    [provider]  lactulose (CHRONULAC) 10 GM/15ML solution TAKE 15ML BY MOUTH 2 TIMES DAILY AS NEEDED FOR MILD CONSTIPATION. Patient taking differently: Take 10 g by mouth See admin instructions. Take 10 g every 2 to 3 days with the citrucel powder 08/16/22   Nafziger, Tommi Rumps, NP  Melatonin 10 MG TABS Take 10 mg by mouth at bedtime.    [provider]  methylcellulose (CITRUCEL) oral powder 1 tablespoon every 2-3 days,alternating with lactulose    [provider]  metolazone (ZAROXOLYN) 5 MG tablet Take 5 mg by mouth once a week. 02/14/22   [provider]  metoprolol succinate (TOPROL-XL) 100 MG 24 hr tablet Take 1 tablet (100 mg total) by mouth daily. Take with or immediately following a meal. Patient not taking: Reported on 10/14/2022 09/21/20 10/14/22  Harold Hedge, MD  neomycin-polymyxin b-dexamethasone (MAXITROL) 3.5-10000-0.1 SUSP Place 1 drop into both eyes 4 (four) times daily. 10/13/22   [provider]  oxyCODONE-acetaminophen (PERCOCET/ROXICET) 5-325 MG tablet Take 1 tablet by mouth every 6 (six) hours as needed. 10/18/22   Ulyses Amor, PA-C  potassium chloride (KLOR-CON) 10 MEQ tablet Take 20 mEq by mouth 2 (two) times daily.    [provider]  potassium chloride SA (KLOR-CON M) 20 MEQ tablet Take 1 tablet (20 mEq total) by mouth 2 (two) times daily. Patient not taking: Reported on 10/14/2022 09/30/21   Arrien, Jimmy Picket, MD  Propylene Glycol (SYSTANE BALANCE OP) Place 1 drop into both  eyes at bedtime.    [provider]  torsemide (DEMADEX) 20 MG tablet Take 2 tablets (40 mg total) by mouth 2 (two) times daily. 09/30/21 10/14/22  Arrien, Jimmy Picket, MD  triamcinolone cream (KENALOG) 0.1 % Apply 1 application topically daily as needed (dry/irritated skin). 07/05/19   [provider]    Physical Exam: Vitals:   10/25/22 0543 10/25/22 0545 10/25/22 0558 10/25/22 0601  BP:  (!) 180/68 (!) 180/86   Pulse:   85   Resp:  (!) 30 18   Temp:   (!) 101.1 F (38.4 C)   SpO2:   90% 97%  Weight: 79.4 kg     Height: '5\' 7"'$  (1.702 m)      Constitutional: NAD, calm, comfortable-appears toxic Eyes: PERRL, lids and conjunctivae normal ENMT: Mucous membranes are dry. Posterior pharynx clear of any exudate or lesions.Normal dentition.  Neck: normal, supple, no masses, no thyromegaly Respiratory: clear to auscultation bilaterally, no wheezing, no crackles upon anterior exam. Normal respiratory effort. No accessory muscle use.  Currently requiring 2 L nasal cannula oxygen Cardiovascular: Regular rate and rhythm, no murmurs / rubs although does have a grade 3/6 systolic murmur consistent with aortic valve murmur.  Wife states is congenital.  Bilateral lower extremity nonpitting edema left greater than right.  2+ pedal pulses. No carotid bruits.  Abdomen: no tenderness, no masses palpated. No hepatosplenomegaly. Bowel sounds positive.  Musculoskeletal: no clubbing / cyanosis. No joint deformity upper and lower extremities. Good ROM, no contractures. Normal muscle tone.  Skin: no rashes, lesions, ulcers. No induration Neurologic: CN 2-12 grossly intact. Sensation intact, DTR normal. Strength 5/5 x all 4 extremities.  Psychiatric: Normal judgment and insight. Slow to respond to questions asked but otherwise is oriented x 3. Normal mood.   Data Reviewed:  As per HPI white count 5400 hemoglobin 8 hematocrit 24.9  Assessment and Plan: Acute hypoxemic respiratory failure  appears to be multifactorial: Acute COVID-19 positive test (U07.1, COVID-19) with Acute Pneumonia (J12.89, Other viral pneumonia) Volume overload secondary to underlying CKD 5 Currently requiring 2 to 4 L O2 to maintain sats greater than 92% Awaiting CRP before determination made about administering Actemra-currently O2 needs relatively stable Begin IV steroid with Decadron Continue supportive care titrating oxygen as needed and flutter valve; maintain mobility Trend CRP, D-dimer and LDH-current platelets are normal (*LDH 209 and D dimer 3.5)  Dialysis per nephrology-chest x-ray without any obvious volume overload Follow-up on blood cultures obtained in the ED  CKD 5-likely initiation of dialysis this admission Left AV fistula placed on 2/27 Baseline weight unknown Clinically appears volume overloaded and has mild hyperkalemia with today's potassium 6.5 although recent potassium in less than 24 hours was 3.4 so this could be related to hemolyzed specimen Appreciate nephrology assistance Patient previously on Demadex and potassium supplementation.  On Zaroxolyn prior to admission and will hold given likelihood of initiation of dialysis this admission.  Can resume if nephrology feels is appropriate. Renal ultrasound pending  Diabetes mellitus 2 Not on medications prior to admission Check hemoglobin A1c-hemoglobin A1c February 2023 was 6.1 Follow CBGs and provide SSI Monitor for hypoglycemia during steroid therapy  Hypertension Continue preadmission Norvasc, Catapres, Cardura, and BiDil  Chronic combined CHF: EF 40 to 45% with grade 2 diastolic dysfunction Severe pulmonary hypertension 60 mmHg Moderate aortic regurgitation Continue GDMT with BiDil, no longer on beta-blocker.  Continue blood pressure control with Norvasc, Catapres and Cardura Unclear if would benefit from SGLT2i pharmacotherapy  History of CAD Continue Plavix and statin  Reported OSA/history of insomnia Previously on  Lunesta and no longer taking ?on CPAP prior to admission  History of GI bleeding Currently asymptomatic Continue Protonix      Advance Care Planning:   Code Status: Full Code confirmed with patient and wife  DVT prophylaxis: Subcutaneous heparin  Consults: Nephrology  Family Communication: Wife at bedside  Severity of Illness: The appropriate patient status for this patient is INPATIENT. Inpatient status is judged to be reasonable and necessary in order to provide the required intensity of service to ensure the patient's safety. The patient's presenting symptoms, physical exam findings, and initial radiographic and laboratory data in the context of their chronic comorbidities is felt to place them at high risk for further clinical deterioration. Furthermore, it is not anticipated that the patient will be medically stable for discharge from the hospital within 2 midnights of admission.   * I certify that at the point of admission it is my clinical judgment that the patient will require inpatient hospital care spanning beyond 2 midnights from the point of admission due to high intensity of service, high risk for further deterioration and high frequency of surveillance required.*  Author: Erin Hearing, NP 10/25/2022 7:47 AM  For on call review [email or web address].

## 2022-10-25 NOTE — ED Provider Notes (Signed)
  Physical Exam  BP (!) 180/86   Pulse 85   Temp (!) 101.1 F (38.4 C)   Resp 18   Ht 1.702 m ('5\' 7"'$ )   Wt 79.4 kg   SpO2 97%   BMI 27.41 kg/m   Physical Exam  Procedures  Procedures  ED Course / MDM   Clinical Course as of 10/25/22 0724  Tue Oct 25, 2022  0636 Hemoglobin(!): 8.7 Chronic anemia [DW]  [id number] Wife is at bedside.  She reports she has had increasing weakness since his vascular procedure.  She reports over the past days had increasing generalized weakness and difficulty walking.  He is also appears slightly confused at times.  He did lose his balance, but no traumatic injuries  [DW]  [phone] Patient found to have a new oxygen requirement, now on 2 L. [DW]  [id number] Patient resting comfortably.  He reports he quit smoking back in the 70s.  He does not use any nebulized treatments at home [DW]  [phone] X-Zela Sobieski was reviewed does not reveal any focal infiltrates, though strong suspicion he has occult pneumonia given his fever, new oxygen requirement and recent cough.  There may also be a component of heart failure.  IV antibiotics have been ordered [DW]  [id number] Patient found to have acute hyperkalemia.  Will consult nephrology [DW]  [phone] Patient found to have COVID-19 which is likely contributing to his symptoms. [DW]  [id number] Discussed with Dr. Hollie Salk with nephrology.  We discussed labs and clinical picture.  She recommends a dose of bicarb, Lokelma 3 times daily, albuterol, Lasix.  She will see the patient.  Admit to the medical service [DW]  [phone] Signed out to dr Averill Winters at shift change to call report [DW]    Clinical Course User Index [DW] Ripley Fraise, MD   Medical Decision Making Amount and/or Complexity of Data Reviewed Labs: ordered. Decision-making details documented in ED Course. Radiology: ordered. ECG/medicine tests: ordered.  Risk Prescription drug management.  88 yo male ho mmp, worsening renal failure, s/p left arm, incresed weakness, fatigue, cough confusion Temp   New o2 requirement CXR- probable failure-question pneumonia Covid 19 positive Potassium here 6.5 Creatinine increased to 8 from 4 Dr. Viona Gilmore  d.w. Dr Hollie Salk Hyperkalemia tx initiated and nephrology to see        Pattricia Boss, MD 10/26/22 [phone]

## 2022-10-25 NOTE — ED Notes (Signed)
Pt gives consent to treat - I pad not working in room 15

## 2022-10-25 NOTE — Progress Notes (Signed)
IR was requested for image guided tunneled HD cath if possible, temp cath acceptable per nephrology.    Patient had fever of 101.1 yesterday, CBC showed WBC wnl but blood cx pending.   IR will proceed with temp cath placement for now, will convert to tunneled cath if blood cx shows no growth > 48 hrs.   Informed consent obtained from wife, in chart.   Please call IR for questions and concerns.   Armando Gang Constanza Mincy PA-C 10/25/2022 1:07 PM

## 2022-10-25 NOTE — Progress Notes (Cosign Needed)
  Transition of Care [facility]) Screening Note   Patient Details  Name: Aksel Francisco Date of Birth: 1935/05/22   Transition of Care [facility]) CM/SW Contact:    Cyndi Bender, RN Phone Number: 10/25/2022, 3:24 PM    Transition of Care Department [facility]) has reviewed patient and no TOC needs have been identified at this time. We will continue to monitor patient advancement through interdisciplinary progression rounds. If new patient transition needs arise, please place a TOC consult.

## 2022-10-25 NOTE — Progress Notes (Signed)
ANTICOAGULATION CONSULT NOTE - Initial Consult  Pharmacy Consult for Heparin infusion Indication:  COVID+ with elevated D-dimer, not critically-ill  Allergies  Allergen Reactions   Aspirin Other (See Comments)    High doses causes stomach ulcer and bleeding    Patient Measurements: Height: '5\' 7"'$  (170.2 cm) Weight: 79.4 kg (175 lb 0.6 oz) IBW/kg (Calculated) : 66.1 Heparin Dosing Weight: 79.4 kg  Vital Signs: Temp: 98.7 F (37.1 C) (03/05 1208) Temp Source: Oral (03/05 1208) BP: 146/55 (03/05 1208) Pulse Rate: 71 (03/05 1208)  Labs: Recent Labs    10/25/22 0555 10/25/22 0645 10/25/22 0651 10/25/22 0927  HGB 8.7*  --  8.5* 8.0*  HCT 25.6*  --  25.0* 24.9*  PLT 325  --   --  187  APTT 35  --   --   --   LABPROT 15.2  --   --   --   INR 1.2  --   --   --   CREATININE  --  7.45* 8.90* 7.61*  7.71*    Estimated Creatinine Clearance: 6.9 mL/min (A) (by C-G formula based on SCr of 7.71 mg/dL (H)).   Medical History: Past Medical History:  Diagnosis Date   Acute diastolic CHF (congestive heart failure) ([facility]) 09/17/2020   Acute exacerbation of CHF (congestive heart failure) ([place]) 08/04/2019   ANEMIA DUE TO CHRONIC BLOOD LOSS 03/13/2007   CAROTID ARTERY STENOSIS 05/10/2010   CHF (congestive heart failure) ([facility])    DIABETES MELLITUS, TYPE II 09/19/2007   DISEASE, CEREBROVASCULAR NEC 03/05/2007   GERD 03/13/2007   HYPERLIPIDEMIA 03/05/2007   HYPERTENSION 03/05/2007   HYPOKALEMIA 11/09/2009   KNEE PAIN, RIGHT 11/09/2009   PROSTATE CANCER, HX OF 03/05/2007   RENAL DISEASE, CHRONIC 02/03/2009    Medications:  Scheduled:   amLODipine  10 mg Oral Daily   atorvastatin  20 mg Oral Daily   [START ON 10/26/2022] Chlorhexidine Gluconate Cloth  6 each Topical Q0600   cholecalciferol  1,000 Units Oral Daily   cloNIDine  0.3 mg Oral BID   clopidogrel  75 mg Oral Daily   dexamethasone (DECADRON) injection  6 mg Intravenous Q24H   doxazosin  2 mg Oral QHS   folic acid  1 mg  Oral Daily   insulin aspart  0-5 Units Subcutaneous QHS   insulin aspart  0-6 Units Subcutaneous TID WC   isosorbide-hydrALAZINE  1 tablet Oral TID   multivitamin with minerals  1 tablet Oral Daily   pantoprazole  40 mg Oral Daily   sodium chloride flush  3 mL Intravenous Q12H    Assessment: 88 yo M presents to ED with generalized weakness, fever, SOB and found to be COVID+ with acute hypoxic respiratory failure. D-dimer is elevated at 3.5. Pt has worsening AKI in the setting of CKD V, progressing to need for iHD. Nephrology is initiating iHD. Pt is requiring supplemental oxygen with 4L [place]. Due to AKI, unable to obtain CT PE to assess for PE. Pt was not taking any anticoagulation prior to admission. Pharmacy consulted to dose heparin infusion for VTE ppx of non-critically ill COVID+ with elevated d-dimer, per NIH COVID guideline recommendations.   Hgb 8, Plt 187  No s/sx of bleeding  Goal of Therapy:  Heparin level 0.3-0.7 units/ml Monitor platelets by anticoagulation protocol: Yes   Plan:  Give 4800 units IV bolus from infusion, followed by Initiate heparin infusion at 1300 units/hr Check heparin level in 8 hours Monitor daily CBC, heparin level, and for s/sx of  bleeding Continue heparin infusion for 14 days or until discharge, whichever is sooner. (No anticoagulation indicated at point of discharge)  Luisa Hart, PharmD, BCPS Clinical Pharmacist 10/25/2022 1:22 PM   Please refer to [facility] for pharmacy phone number

## 2022-10-25 NOTE — ED Provider Notes (Signed)
[facility] Provider Note   CSN: [id number] Arrival date & time: 10/25/22  0534     History  Chief Complaint  Patient presents with   Fever    Austin [name] is a 88 y.o. male.  The history is provided by the patient and the EMS personnel.  Patient with extensive history including chronic kidney disease, hypertension presents with generalized weakness for 1 week.  EMS reports most of the history.  They report the patient's had some confusion at home.  On their arrival patient had an oral temperature up to 102.  Patient was given Tylenol.  Patient reports he is feeling generalized weakness, does have increased cough and shortness of breath.  No vomiting.  No chest pain or abdominal pain. Patient had a AV graft placed to his left arm last month     Home Medications Prior to Admission medications   Medication Sig Start Date End Date Taking? Authorizing Provider  ACCU-CHEK GUIDE test strip USE TO CHECK BLOOD GLUCOSE TWICE A DAY OR AS NEEDED 06/16/22   Nafziger, Tommi Rumps, NP  Accu-Chek Softclix Lancets lancets USED TO CHECK BLOOD GLUCOSE TWICE A DAY OR AS NEEDED 03/08/22   Nafziger, Tommi Rumps, NP  acetaminophen (TYLENOL) 500 MG tablet Take 1,000 mg by mouth 2 (two) times daily.    [provider]  albuterol (VENTOLIN HFA) 108 (90 Base) MCG/ACT inhaler TAKE 2 PUFFS BY MOUTH EVERY 6 HOURS AS NEEDED FOR WHEEZE OR SHORTNESS OF BREATH 09/13/22   Collene Gobble, MD  amLODipine (NORVASC) 10 MG tablet TAKE 1 TABLET BY MOUTH EVERY DAY 05/26/22   Nafziger, Tommi Rumps, NP  atorvastatin (LIPITOR) 20 MG tablet TAKE 1 TABLET BY MOUTH EVERY DAY 12/23/21   Nafziger, Tommi Rumps, NP  blood glucose meter kit and supplies Dispense based on patient and insurance preference. Use up to four times daily as directed. (FOR ICD-10 E10.9, E11.9). 08/17/22   Nafziger, Tommi Rumps, NP  Blood Glucose Monitoring Suppl (ACCU-CHEK AVIVA PLUS) w/Device KIT Used to check blood glucose 2 times a day or  PRN Patient taking differently: in the morning and at bedtime. 09/04/19   Nafziger, Tommi Rumps, NP  cholecalciferol (VITAMIN D3) 25 MCG (1000 UNIT) tablet Take 1,000 Units by mouth daily.    [provider]  cloNIDine (CATAPRES) 0.3 MG tablet Take 1 tablet (0.3 mg total) by mouth 3 (three) times daily. Patient taking differently: Take 0.3 mg by mouth 2 (two) times daily. 09/20/20   Harold Hedge, MD  clopidogrel (PLAVIX) 75 MG tablet TAKE 1 TABLET BY MOUTH EVERY DAY 07/12/22   Nafziger, Tommi Rumps, NP  doxazosin (CARDURA) 2 MG tablet Take 2 mg by mouth at bedtime.  12/28/17   [provider]  esomeprazole (NEXIUM) 40 MG capsule TAKE 1 CAPSULE BY MOUTH EVERY DAY 03/15/22   Nafziger, Tommi Rumps, NP  eszopiclone (LUNESTA) 1 MG TABS tablet Take 1 tablet (1 mg total) by mouth at bedtime as needed for sleep. Take immediately before bedtime Patient not taking: Reported on 10/14/2022 11/26/21   Laurin Coder, MD  guaiFENesin (MUCINEX) 600 MG 12 hr tablet Take 600 mg by mouth at bedtime.    [provider]  hydrocortisone (ANUSOL-HC) 2.5 % rectal cream Place 1 application rectally 2 (two) times daily. Patient not taking: Reported on 10/14/2022 10/12/21   Dorothyann Peng, NP  isosorbide-hydrALAZINE (BIDIL) 20-37.5 MG tablet Take 1 tablet by mouth 3 (three) times daily.    [provider]  lactulose ([place]) 10 GM/15ML  solution TAKE 15ML BY MOUTH 2 TIMES DAILY AS NEEDED FOR MILD CONSTIPATION. Patient taking differently: Take 10 g by mouth See admin instructions. Take 10 g every 2 to 3 days with the citrucel powder 08/16/22   Nafziger, Tommi Rumps, NP  Melatonin 10 MG TABS Take 10 mg by mouth at bedtime.    [provider]  methylcellulose (CITRUCEL) oral powder 1 tablespoon every 2-3 days,alternating with lactulose    [provider]  metolazone (ZAROXOLYN) 5 MG tablet Take 5 mg by mouth once a week. 02/14/22   [provider]  metoprolol succinate (TOPROL-XL) 100 MG 24 hr  tablet Take 1 tablet (100 mg total) by mouth daily. Take with or immediately following a meal. Patient not taking: Reported on 10/14/2022 09/21/20 10/14/22  Harold Hedge, MD  neomycin-polymyxin b-dexamethasone (MAXITROL) 3.5-10000-0.1 SUSP Place 1 drop into both eyes 4 (four) times daily. 10/13/22   [provider]  oxyCODONE-acetaminophen (PERCOCET/ROXICET) 5-325 MG tablet Take 1 tablet by mouth every 6 (six) hours as needed. 10/18/22   Ulyses Amor, PA-C  potassium chloride (KLOR-CON) 10 MEQ tablet Take 20 mEq by mouth 2 (two) times daily.    [provider]  potassium chloride SA (KLOR-CON M) 20 MEQ tablet Take 1 tablet (20 mEq total) by mouth 2 (two) times daily. Patient not taking: Reported on 10/14/2022 09/30/21   Arrien, Jimmy Picket, MD  Propylene Glycol (SYSTANE BALANCE OP) Place 1 drop into both eyes at bedtime.    [provider]  torsemide (DEMADEX) 20 MG tablet Take 2 tablets (40 mg total) by mouth 2 (two) times daily. 09/30/21 10/14/22  Arrien, Jimmy Picket, MD  triamcinolone cream (KENALOG) 0.1 % Apply 1 application topically daily as needed (dry/irritated skin). 07/05/19   [provider]      Allergies    Aspirin    Review of Systems   Review of Systems  Constitutional:  Positive for fatigue and fever.  Respiratory:  Positive for cough.     Physical Exam Updated Vital Signs BP (!) 180/86   Pulse 85   Temp (!) 101.1 F (38.4 C)   Resp 18   Ht 1.702 m ('5\' 7"'$ )   Wt 79.4 kg   SpO2 97%   BMI 27.41 kg/m  Physical Exam CONSTITUTIONAL: Elderly, no acute distress HEAD: Normocephalic/atraumatic EYES: EOMI ENMT: Mucous membranes moist NECK: supple no meningeal signs CV: S1/S2 noted LUNGS: Tachypnea, crackles noted throughout the left lung field ABDOMEN: soft, nontender NEURO: Pt is awake/alert/appropriate, moves all extremitiesx4.  No facial droop.   EXTREMITIES: pulses normal/equal, full ROM Dialysis graft noted to left upper  extremity, thrill noted.  Mild erythema noted, no tenderness, no drainage or crepitus SKIN: warm, color normal, see photo PSYCH: no abnormalities of mood noted, alert and oriented to situation   ED Results / Procedures / Treatments   Labs (all labs ordered are listed, but only abnormal results are displayed) Labs Reviewed  RESP PANEL BY RT-PCR (RSV, FLU A&B, COVID)  [id number] - Abnormal; Notable for the following components:      Result Value   SARS Coronavirus 2 by RT PCR POSITIVE (*)    All other components within normal limits  CBC WITH DIFFERENTIAL/PLATELET - Abnormal; Notable for the following components:   RBC 3.11 (*)    Hemoglobin 8.7 (*)    HCT 25.6 (*)    Lymphs Abs 0.1 (*)    All other components within normal limits  I-STAT CHEM 8, ED - Abnormal; Notable  for the following components:   Potassium 6.5 (*)    Chloride 112 (*)    BUN >130 (*)    Creatinine, Ser 8.90 (*)    Glucose, Bld 159 (*)    Calcium, Ion 1.00 (*)    TCO2 19 (*)    Hemoglobin 8.5 (*)    HCT 25.0 (*)    All other components within normal limits  CULTURE, BLOOD (ROUTINE X 2)  CULTURE, BLOOD (ROUTINE X 2)  LACTIC ACID, PLASMA  PROTIME-INR  APTT  LACTIC ACID, PLASMA  URINALYSIS, ROUTINE W REFLEX MICROSCOPIC  COMPREHENSIVE METABOLIC PANEL  BRAIN NATRIURETIC PEPTIDE    EKG EKG Interpretation  Date/Time:  Tuesday October 25 2022 05:48:50 EST Ventricular Rate:  95 PR Interval:  185 QRS Duration: 91 QT Interval:  349 QTC Calculation: 439 R Axis:   -29 Text Interpretation: Sinus rhythm Left ventricular hypertrophy Nonspecific T abnormalities, lateral leads Confirmed by Ripley Fraise [id number]) on 10/25/2022 5:52:47 AM  Radiology DG Chest Port 1 View  Result Date: 10/25/2022 CLINICAL DATA:  88 year old male with possible sepsis.  Fever. EXAM: PORTABLE CHEST 1 VIEW COMPARISON:  Chest x-ray 09/26/2021. FINDINGS: Lung volumes are normal. Widespread areas of interstitial prominence and peribronchial  cuffing are noted, concerning for probable acute bronchitis. No definite confluent consolidative airspace disease. No pleural effusions. No pneumothorax. Pulmonary vasculature does not appear engorged. Heart size is mildly enlarged. Upper mediastinal contours are within normal limits allowing for patient rotation to the left. Atherosclerotic calcifications in the thoracic aorta. IMPRESSION: 1. The appearance the chest is concerning for an acute bronchitis. 2. Mild cardiomegaly. 3. Aortic atherosclerosis. Electronically Signed   By: Vinnie Langton M.D.   On: 10/25/2022 06:18    Procedures .Critical Care  Performed by: Ripley Fraise, MD Authorized by: Ripley Fraise, MD   Critical care provider statement:    Critical care time (minutes):  75   Critical care start time:  10/25/2022 5:50 AM   Critical care end time:  10/25/2022 7:05 AM   Critical care time was exclusive of:  Separately billable procedures and treating other patients   Critical care was necessary to treat or prevent imminent or life-threatening deterioration of the following conditions:  Renal failure and sepsis   Critical care was time spent personally by me on the following activities:  Evaluation of patient's response to treatment, obtaining history from patient or surrogate, pulse oximetry, ordering and review of radiographic studies, ordering and review of laboratory studies, ordering and performing treatments and interventions, discussions with consultants, development of treatment plan with patient or surrogate, examination of patient, review of old charts and re-evaluation of patient's condition   I assumed direction of critical care for this patient from another provider in my specialty: no       Medications Ordered in ED Medications  cefTRIAXone (ROCEPHIN) 1 g in sodium chloride 0.9 % 100 mL IVPB (1 g Intravenous New Bag/Given 10/25/22 0659)  azithromycin (ZITHROMAX) 500 mg in sodium chloride 0.9 % 250 mL IVPB (has no  administration in time range)  furosemide (LASIX) injection 40 mg (has no administration in time range)  sodium zirconium cyclosilicate (LOKELMA) packet 5 g (has no administration in time range)  sodium bicarbonate injection 50 mEq (has no administration in time range)  albuterol (PROVENTIL) (2.5 MG/3ML) 0.083% nebulizer solution 10 mg (has no administration in time range)    ED Course/ Medical Decision Making/ A&P Clinical Course as of 10/25/22 0719  Tue Oct 25, 2022  0636 Hemoglobin(!): 8.7  Chronic anemia [DW]  0636 Wife is at bedside.  She reports she has had increasing weakness since his vascular procedure.  She reports over the past days had increasing generalized weakness and difficulty walking.  He is also appears slightly confused at times.  He did lose his balance, but no traumatic injuries  [DW]  [phone] Patient found to have a new oxygen requirement, now on 2 L. [DW]  [id number] Patient resting comfortably.  He reports he quit smoking back in the 70s.  He does not use any nebulized treatments at home [DW]  [phone] X-ray was reviewed does not reveal any focal infiltrates, though strong suspicion he has occult pneumonia given his fever, new oxygen requirement and recent cough.  There may also be a component of heart failure.  IV antibiotics have been ordered [DW]  [id number] Patient found to have acute hyperkalemia.  Will consult nephrology [DW]  [phone] Patient found to have COVID-19 which is likely contributing to his symptoms. [DW]  [id number] Discussed with Dr. Hollie Salk with nephrology.  We discussed labs and clinical picture.  She recommends a dose of bicarb, Lokelma 3 times daily, albuterol, Lasix.  She will see the patient.  Admit to the medical service [DW]  [phone] Signed out to dr ray at shift change to call report [DW]    Clinical Course User Index [DW] Ripley Fraise, MD                             Medical Decision Making Amount and/or Complexity of Data Reviewed Labs: ordered. Decision-making  details documented in ED Course. Radiology: ordered. ECG/medicine tests: ordered.  Risk Prescription drug management.   This patient presents to the ED for concern of fever and weakness, this involves an extensive number of treatment options, and is a complaint that carries with it a high risk of complications and morbidity.  The differential diagnosis includes but is not limited to acute coronary syndrome, renal failure, urinary tract infection, electrolyte disturbance, pneumonia    Comorbidities that complicate the patient evaluation: Patient's presentation is complicated by their history of renal failure, hypertension   Additional history obtained: Additional history obtained from EMS  Records reviewed previous admission documents recent op note reviewed  Lab Tests: I Ordered, and personally interpreted labs.  The pertinent results include: Hyperkalemia, acute kidney injury, COVID-19  Imaging Studies ordered: I ordered imaging studies including X-ray chest   I independently visualized and interpreted imaging which showed bronchitis versus edema I agree with the radiologist interpretation  Cardiac Monitoring: The patient was maintained on a cardiac monitor.  I personally viewed and interpreted the cardiac monitor which showed an underlying rhythm of:  sinus rhythm  Medicines ordered and prescription drug management: I ordered medication including antibiotics for presumed pneumonia Lasix/Lokelma/bicarb and albuterol for hyperkalemia Reevaluation of the patient after these medicines showed that the patient    stayed the same   Critical Interventions:   admission for treatment for hyperkalemia and COVID-19  Consultations Obtained: I requested consultation with the consultant nephrology Dr. Hollie Salk , and discussed  findings as well as pertinent plan - they recommend: Medications for treatment for hyperkalemia  Reevaluation: After the interventions noted above, I reevaluated  the patient and found that they have :stayed the same  Complexity of problems addressed: Patient's presentation is most consistent with  acute presentation with potential threat to life or bodily function  Disposition: After consideration of the diagnostic results and the patient's  response to treatment,  I feel that the patent would benefit from admission   .           Final Clinical Impression(s) / ED Diagnoses Final diagnoses:  Acute respiratory failure with hypoxia ([place])  Community acquired pneumonia, unspecified laterality  COVID-19  AKI (acute kidney injury) ([place])  Hyperkalemia    Rx / DC Orders ED Discharge Orders     None         Ripley Fraise, MD 10/25/22 [phone]

## 2022-10-25 NOTE — ED Triage Notes (Signed)
Pt BIB [facility] d/t weakness for 1 week and "Not acting himself".  Pt had 102 oral temp.  EMS gave 650 mg Tylenol in route.  Pt had Fistula placed 2/27 in Left arm.

## 2022-10-25 NOTE — Hospital Course (Addendum)
88 year old man PMH CKD stage V and recent placement of AV fistula presented to the emergency department with generalized weakness.  Found to be febrile and hypoxic.  Admitted for acute hypoxic respiratory failure, fever, COVID-pneumonia.  Currently patient feels well and is breathing fine.  No pain.  Tmax 101.1 Vitals otherwise stable.  Sats in the 90s on 4 L General.  Appears calm and comfortable.  Nontoxic.  Does not appear ill. Respiratory.  Clear to auscultation bilaterally.  No wheezes, rales or rhonchi.  Normal respiratory effort. Cardiovascular.  Regular rate and rhythm.  No murmur, rub or gallop.  2+ pedal edema. Psychiatric.  Grossly normal mood and affect.  Speech is fluent and appropriate.  COVID-positive Potassium 6.5, unclear if this is accurate as BMP 6 minutes earlier showed potassium of 3.7 and creatinine of 7.45 Creatinine 8.9, with baseline approximately 4.5. BUN greater than 130 Hemoglobin stable at 8.7.  Normocytic anemia. Renal ultrasound pending Chest x-ray per radiology suggestive bronchitis.  CKD stage V  Status post AV fistula placement 2/27 EDP discussed with Dr. Hollie Salk who recommended bicarb, Lokelma 3 times daily, albuterol Lasix.  Nephrology will see in consultation.

## 2022-10-25 NOTE — Consult Note (Signed)
[facility]  HISTORY AND PHYSICAL  Austin [name] is an 88 y.o. male.    Chief Complaint: AMS and swelling  HPI: Pt is an 31M with advanced CKD, HTN, HLD, DM II who is now seen in consultation at the request of Dr Sarajane Jews for eval and recs re: AKI on CKD.  Pt had AVG placed 2/27 by [facility]- went well.  At that time, BUN was 117 and Cr was 7.4.  Since that time, pt has been having bad taste in mouth, swelling, AMS.  Wife brought him in for eval.    In ED, BUN 14, Cr 8.9.  K 3.7 (said 6.5 on Istat, not accurate).  In this setting we are asked to see.  Pt's wife says pt has been sleeping more.  Is followed by Dr Royce Macadamia.  UOP has remained the same.    COVID + in ED.    PMH: Past Medical History:  Diagnosis Date   Acute diastolic CHF (congestive heart failure) ([place]) 09/17/2020   Acute exacerbation of CHF (congestive heart failure) ([place]) 08/04/2019   ANEMIA DUE TO CHRONIC BLOOD LOSS 03/13/2007   CAROTID ARTERY STENOSIS 05/10/2010   CHF (congestive heart failure) ([place])    DIABETES MELLITUS, TYPE II 09/19/2007   DISEASE, CEREBROVASCULAR NEC 03/05/2007   GERD 03/13/2007   HYPERLIPIDEMIA 03/05/2007   HYPERTENSION 03/05/2007   HYPOKALEMIA 11/09/2009   KNEE PAIN, RIGHT 11/09/2009   PROSTATE CANCER, HX OF 03/05/2007   RENAL DISEASE, CHRONIC 02/03/2009   PSH: Past Surgical History:  Procedure Laterality Date   AV FISTULA PLACEMENT Left 10/18/2022   Procedure: INSERTION OF LEFT ARM BRACHIAL ARTERY TO AXILLARY VEIN ARTERIOVENOUS (AV) GORE-TEX GRAFT;  Surgeon: Broadus John, MD;  Location: [facility];  Service: Vascular;  Laterality: Left;   CAROTID ARTERY ANGIOPLASTY Right Oct. 10, 2001   ESOPHAGOGASTRODUODENOSCOPY (EGD) WITH PROPOFOL N/A 11/04/2016   Procedure: ESOPHAGOGASTRODUODENOSCOPY (EGD) WITH PROPOFOL;  Surgeon: Otis Brace, MD;  Location: [facility];  Service: Gastroenterology;  Laterality: N/A;   LAPAROSCOPIC APPENDECTOMY  02/20/2012   Procedure: APPENDECTOMY LAPAROSCOPIC;   Surgeon: Stark Klein, MD;  Location: [facility];  Service: General;  Laterality: N/A;   PROSTATE SURGERY     prostatectomy     Past Medical History:  Diagnosis Date   Acute diastolic CHF (congestive heart failure) ([place]) 09/17/2020   Acute exacerbation of CHF (congestive heart failure) ([place]) 08/04/2019   ANEMIA DUE TO CHRONIC BLOOD LOSS 03/13/2007   CAROTID ARTERY STENOSIS 05/10/2010   CHF (congestive heart failure) ([place])    DIABETES MELLITUS, TYPE II 09/19/2007   DISEASE, CEREBROVASCULAR NEC 03/05/2007   GERD 03/13/2007   HYPERLIPIDEMIA 03/05/2007   HYPERTENSION 03/05/2007   HYPOKALEMIA 11/09/2009   KNEE PAIN, RIGHT 11/09/2009   PROSTATE CANCER, HX OF 03/05/2007   RENAL DISEASE, CHRONIC 02/03/2009    Medications:  Prior to Admission:  Facility-Administered Medications Prior to Admission  Medication Dose Route Frequency Provider Last Rate Last Admin   ammonium lactate (LAC-HYDRIN) 12 % lotion   Topical PRN Felipa Furnace, DPM       Medications Prior to Admission  Medication Sig Dispense Refill Last Dose   acetaminophen (TYLENOL) 500 MG tablet Take 1,000 mg by mouth 2 (two) times daily.   unk   amLODipine (NORVASC) 10 MG tablet TAKE 1 TABLET BY MOUTH EVERY DAY (Patient taking differently: Take 10 mg by mouth daily.) 90 tablet 1 10/24/2022   atorvastatin (LIPITOR) 20 MG tablet TAKE 1 TABLET BY MOUTH EVERY DAY 90 tablet  3 10/24/2022   cholecalciferol (VITAMIN D3) 25 MCG (1000 UNIT) tablet Take 1,000 Units by mouth daily.   10/24/2022   ACCU-CHEK GUIDE test strip USE TO CHECK BLOOD GLUCOSE TWICE A DAY OR AS NEEDED 100 strip 1    Accu-Chek Softclix Lancets lancets USED TO CHECK BLOOD GLUCOSE TWICE A DAY OR AS NEEDED 100 each 6    albuterol (VENTOLIN HFA) 108 (90 Base) MCG/ACT inhaler TAKE 2 PUFFS BY MOUTH EVERY 6 HOURS AS NEEDED FOR WHEEZE OR SHORTNESS OF BREATH (Patient not taking: Reported on 10/25/2022) 8.5 each 5 Not Taking   blood glucose meter kit and supplies Dispense based on patient  and insurance preference. Use up to four times daily as directed. (FOR ICD-10 E10.9, E11.9). 1 each 0    Blood Glucose Monitoring Suppl (ACCU-CHEK AVIVA PLUS) w/Device KIT Used to check blood glucose 2 times a day or PRN (Patient taking differently: in the morning and at bedtime.) 1 kit 0    cloNIDine (CATAPRES) 0.3 MG tablet Take 1 tablet (0.3 mg total) by mouth 3 (three) times daily. (Patient taking differently: Take 0.3 mg by mouth 2 (two) times daily.) 60 tablet 0    clopidogrel (PLAVIX) 75 MG tablet TAKE 1 TABLET BY MOUTH EVERY DAY (Patient taking differently: Take 75 mg by mouth daily.) 90 tablet 3    doxazosin (CARDURA) 2 MG tablet Take 2 mg by mouth at bedtime.   3    esomeprazole (NEXIUM) 40 MG capsule TAKE 1 CAPSULE BY MOUTH EVERY DAY 90 capsule 3    eszopiclone (LUNESTA) 1 MG TABS tablet Take 1 tablet (1 mg total) by mouth at bedtime as needed for sleep. Take immediately before bedtime (Patient not taking: Reported on 10/14/2022) 30 tablet 1    guaiFENesin (MUCINEX) 600 MG 12 hr tablet Take 600 mg by mouth at bedtime.      hydrocortisone (ANUSOL-HC) 2.5 % rectal cream Place 1 application rectally 2 (two) times daily. (Patient not taking: Reported on 10/14/2022) 28 g 3    isosorbide-hydrALAZINE (BIDIL) 20-37.5 MG tablet Take 1 tablet by mouth 3 (three) times daily.      lactulose (CHRONULAC) 10 GM/15ML solution TAKE 15ML BY MOUTH 2 TIMES DAILY AS NEEDED FOR MILD CONSTIPATION. (Patient taking differently: Take 10 g by mouth See admin instructions. Take 10 g every 2 to 3 days with the citrucel powder) 15 mL 1    Melatonin 10 MG TABS Take 10 mg by mouth at bedtime.      methylcellulose (CITRUCEL) oral powder 1 tablespoon every 2-3 days,alternating with lactulose      metolazone (ZAROXOLYN) 5 MG tablet Take 5 mg by mouth once a week.      metoprolol succinate (TOPROL-XL) 100 MG 24 hr tablet Take 1 tablet (100 mg total) by mouth daily. Take with or immediately following a meal. (Patient not taking:  Reported on 10/14/2022) 30 tablet 0    neomycin-polymyxin b-dexamethasone (MAXITROL) 3.5-10000-0.1 SUSP Place 1 drop into both eyes 4 (four) times daily.      oxyCODONE-acetaminophen (PERCOCET/ROXICET) 5-325 MG tablet Take 1 tablet by mouth every 6 (six) hours as needed. 20 tablet 0    potassium chloride (KLOR-CON) 10 MEQ tablet Take 20 mEq by mouth 2 (two) times daily.      potassium chloride SA (KLOR-CON M) 20 MEQ tablet Take 1 tablet (20 mEq total) by mouth 2 (two) times daily. (Patient not taking: Reported on 10/14/2022) 60 tablet 0    Propylene Glycol (SYSTANE BALANCE OP) Place  1 drop into both eyes at bedtime.      torsemide (DEMADEX) 20 MG tablet Take 2 tablets (40 mg total) by mouth 2 (two) times daily. 120 tablet 0    triamcinolone cream (KENALOG) 0.1 % Apply 1 application topically daily as needed (dry/irritated skin).       (Not in a hospital admission)   ALLERGIES:   Allergies  Allergen Reactions   Aspirin Other (See Comments)    High doses causes stomach ulcer and bleeding    FAM HX: Family History  Problem Relation Age of Onset   Hypertension Mother    Cancer Father        Mesothelioma    Stomach cancer Brother    Cancer Brother    Cancer - Cervical Brother    Cancer Brother    Diabetes Brother    Esophageal cancer Neg Hx    Colon cancer Neg Hx    Pancreatic cancer Neg Hx     Social History:   reports that he quit smoking about 48 years ago. His smoking use included cigarettes. He has a 10.00 pack-year smoking history. He has never used smokeless tobacco. He reports that he does not drink alcohol and does not use drugs.  ROS: ROS: all other systems reviewed and are negative except as per HPI   Blood pressure (!) 149/55, pulse 85, temperature 98.8 F (37.1 C), temperature source Oral, resp. rate 16, height '5\' 7"'$  (1.702 m), weight 79.4 kg, SpO2 97 %. PHYSICAL EXAM: Physical Exam GEN sleeping, easily arousable HEENT EOMI PERRL NECK NO JVD PULM occasional  expiratory wheeze CV RRR no rubs, soft murmur ABD soft, nontender NABS EXT 2+ pedal edema NEURO sleeping, arousable, no frank asterixis SKIN warm and dry   Results for orders placed or performed during the hospital encounter of 10/25/22 (from the past 48 hour(s))  Resp panel by RT-PCR (RSV, Flu A&B, Covid) Anterior Nasal Swab     Status: Abnormal   Collection Time: 10/25/22  5:46 AM   Specimen: Anterior Nasal Swab  Result Value Ref Range   SARS Coronavirus 2 by RT PCR POSITIVE (A) NEGATIVE   Influenza A by PCR NEGATIVE NEGATIVE   Influenza B by PCR NEGATIVE NEGATIVE    Comment: (NOTE) The Xpert Xpress SARS-CoV-2/FLU/RSV plus assay is intended as an aid in the diagnosis of influenza from Nasopharyngeal swab specimens and should not be used as a sole basis for treatment. Nasal washings and aspirates are unacceptable for Xpert Xpress SARS-CoV-2/FLU/RSV testing.  Fact Sheet for Patients: EntrepreneurPulse.com.au  Fact Sheet for Healthcare Providers: IncredibleEmployment.be  This test is not yet approved or cleared by the [place] FDA and has been authorized for detection and/or diagnosis of SARS-CoV-2 by FDA under an Emergency Use Authorization (EUA). This EUA will remain in effect (meaning this test can be used) for the duration of the COVID-19 declaration under Section 564(b)(1) of the Act, 21 U.S.C. section 360bbb-3(b)(1), unless the authorization is terminated or revoked.     Resp Syncytial Virus by PCR NEGATIVE NEGATIVE    Comment: (NOTE) Fact Sheet for Patients: EntrepreneurPulse.com.au  Fact Sheet for Healthcare Providers: IncredibleEmployment.be  This test is not yet approved or cleared by the [place] FDA and has been authorized for detection and/or diagnosis of SARS-CoV-2 by FDA under an Emergency Use Authorization (EUA). This EUA will remain in effect (meaning this test can be used)  for the duration of the COVID-19 declaration under Section 564(b)(1) of the Act, 21 U.S.C. section 360bbb-3(b)(1),  unless the authorization is terminated or revoked.  Performed at [facility], [place] [address], [place], [place] [address]   Lactic acid, plasma     Status: None   Collection Time: 10/25/22  5:55 AM  Result Value Ref Range   Lactic Acid, Venous 1.5 0.5 - 1.9 mmol/L    Comment: Performed at [facility] [address], [place], [place] [address]  CBC with Differential     Status: Abnormal   Collection Time: 10/25/22  5:55 AM  Result Value Ref Range   WBC 7.4 4.0 - 10.5 K/uL   RBC 3.11 (L) 4.22 - 5.81 MIL/uL   Hemoglobin 8.7 (L) 13.0 - 17.0 g/dL   HCT 25.6 (L) 39.0 - 52.0 %   MCV 82.3 80.0 - 100.0 fL   MCH 28.0 26.0 - 34.0 pg   MCHC 34.0 30.0 - 36.0 g/dL   RDW 15.4 11.5 - 15.5 %   Platelets 325 150 - 400 K/uL   nRBC 0.0 0.0 - 0.2 %   Neutrophils Relative % 92 %   Neutro Abs 6.8 1.7 - 7.7 K/uL   Lymphocytes Relative 2 %   Lymphs Abs 0.1 (L) 0.7 - 4.0 K/uL   Monocytes Relative 6 %   Monocytes Absolute 0.4 0.1 - 1.0 K/uL   Eosinophils Relative 0 %   Eosinophils Absolute 0.0 0.0 - 0.5 K/uL   Basophils Relative 0 %   Basophils Absolute 0.0 0.0 - 0.1 K/uL   Immature Granulocytes 0 %   Abs Immature Granulocytes 0.02 0.00 - 0.07 K/uL    Comment: Performed at [facility], [place] [address] [place], [place] [address]  Protime-INR     Status: None   Collection Time: 10/25/22  5:55 AM  Result Value Ref Range   Prothrombin Time 15.2 11.4 - 15.2 seconds   INR 1.2 0.8 - 1.2    Comment: (NOTE) INR goal varies based on device and disease states. Performed at [facility], [place] [address], [place], [place] [address]   APTT     Status: None   Collection Time: 10/25/22  5:55 AM  Result Value Ref Range   aPTT 35 24 - 36 seconds    Comment: Performed at [facility] [address] [place], [place] [address]  Brain natriuretic peptide      Status: Abnormal   Collection Time: 10/25/22  5:55 AM  Result Value Ref Range   B Natriuretic Peptide 2,179.9 (H) 0.0 - 100.0 pg/mL    Comment: Performed at [facility] [address] [place],  [address]  Comprehensive metabolic panel     Status: Abnormal   Collection Time: 10/25/22  6:45 AM  Result Value Ref Range   Sodium 142 135 - 145 mmol/L   Potassium 3.7 3.5 - 5.1 mmol/L   Chloride 110 98 - 111 mmol/L   CO2 18 (L) 22 - 32 mmol/L   Glucose, Bld 167 (H) 70 - 99 mg/dL    Comment: Glucose reference range applies only to samples taken after fasting for at least 8 hours.   BUN 147 (H) 8 - 23 mg/dL   Creatinine, Ser 7.45 (H) 0.61 - 1.24 mg/dL   Calcium 8.6 (L) 8.9 - 10.3 mg/dL   Total Protein 6.5 6.5 - 8.1 g/dL   Albumin 2.9 (L) 3.5 - 5.0 g/dL   AST 26 15 - 41 U/L   ALT 6 0 - 44 U/L  Alkaline Phosphatase 42 38 - 126 U/L   Total Bilirubin 0.6 0.3 - 1.2 mg/dL   GFR, Estimated 7 (L) >60 mL/min    Comment: (NOTE) Calculated using the CKD-EPI Creatinine Equation (2021)    Anion gap 14 5 - 15    Comment: Performed at [facility] [address], [place], [place] [address]  I-stat chem 8, ED (not at [facility], [facility] or [facility])     Status: Abnormal   Collection Time: 10/25/22  6:51 AM  Result Value Ref Range   Sodium 141 135 - 145 mmol/L   Potassium 6.5 (HH) 3.5 - 5.1 mmol/L   Chloride 112 (H) 98 - 111 mmol/L   BUN >130 (H) 8 - 23 mg/dL   Creatinine, Ser 8.90 (H) 0.61 - 1.24 mg/dL   Glucose, Bld 159 (H) 70 - 99 mg/dL    Comment: Glucose reference range applies only to samples taken after fasting for at least 8 hours.   Calcium, Ion 1.00 (L) 1.15 - 1.40 mmol/L   TCO2 19 (L) 22 - 32 mmol/L   Hemoglobin 8.5 (L) 13.0 - 17.0 g/dL   HCT 25.0 (L) 39.0 - 52.0 %   Comment NOTIFIED PHYSICIAN   CBG monitoring, ED     Status: Abnormal   Collection Time: 10/25/22  8:37 AM  Result Value Ref Range   Glucose-Capillary 143 (H) 70 - 99 mg/dL    Comment: Glucose reference range  applies only to samples taken after fasting for at least 8 hours.    DG Chest Port 1 View  Result Date: 10/25/2022 CLINICAL DATA:  88 year old male with possible sepsis.  Fever. EXAM: PORTABLE CHEST 1 VIEW COMPARISON:  Chest x-ray 09/26/2021. FINDINGS: Lung volumes are normal. Widespread areas of interstitial prominence and peribronchial cuffing are noted, concerning for probable acute bronchitis. No definite confluent consolidative airspace disease. No pleural effusions. No pneumothorax. Pulmonary vasculature does not appear engorged. Heart size is mildly enlarged. Upper mediastinal contours are within normal limits allowing for patient rotation to the left. Atherosclerotic calcifications in the thoracic aorta. IMPRESSION: 1. The appearance the chest is concerning for an acute bronchitis. 2. Mild cardiomegaly. 3. Aortic atherosclerosis. Electronically Signed   By: Vinnie Langton M.D.   On: 10/25/2022 06:18    Assessment/Plan  AKI on Advanced CKD: - likely progression, now uremic - will do renal US for completeness to ensure no obstruction after anesthesia last week- doubt this is driving factor - will need to start dialysis - d/w pt and wife- will need TDC to start, IR order placed - HD #1 orders written   2.  Acute encephalopathy:  - likely multifactorial- uremia, COVID etc  - per primary  3. COVID +  - got CAP coverage  - on dex  4.  Anemia:  - will do iron panel  - will likely need ESA  5.  BMM:  - PTH  6.  Dispo: admitted, will need CLIP    Ranard Harte 10/25/2022, 9:41 AM

## 2022-10-26 ENCOUNTER — Inpatient Hospital Stay (HOSPITAL_COMMUNITY): Payer: Medicare PPO

## 2022-10-26 DIAGNOSIS — R7989 Other specified abnormal findings of blood chemistry: Secondary | ICD-10-CM

## 2022-10-26 DIAGNOSIS — Z992 Dependence on renal dialysis: Secondary | ICD-10-CM | POA: Diagnosis not present

## 2022-10-26 DIAGNOSIS — J9601 Acute respiratory failure with hypoxia: Secondary | ICD-10-CM | POA: Diagnosis not present

## 2022-10-26 DIAGNOSIS — U071 COVID-19: Secondary | ICD-10-CM | POA: Diagnosis not present

## 2022-10-26 DIAGNOSIS — N186 End stage renal disease: Secondary | ICD-10-CM

## 2022-10-26 LAB — HEMOGLOBIN A1C
Hgb A1c MFr Bld: 7.4 % — ABNORMAL HIGH (ref 4.8–5.6)
Mean Plasma Glucose: 166 mg/dL

## 2022-10-26 LAB — CBC
HCT: 21.8 % — ABNORMAL LOW (ref 39.0–52.0)
Hemoglobin: 7.3 g/dL — ABNORMAL LOW (ref 13.0–17.0)
MCH: 26.8 pg (ref 26.0–34.0)
MCHC: 33.5 g/dL (ref 30.0–36.0)
MCV: 80.1 fL (ref 80.0–100.0)
Platelets: 193 10*3/uL (ref 150–400)
RBC: 2.72 MIL/uL — ABNORMAL LOW (ref 4.22–5.81)
RDW: 14.3 % (ref 11.5–15.5)
WBC: 2.7 10*3/uL — ABNORMAL LOW (ref 4.0–10.5)
nRBC: 0 % (ref 0.0–0.2)

## 2022-10-26 LAB — BASIC METABOLIC PANEL
Anion gap: 13 (ref 5–15)
BUN: 100 mg/dL — ABNORMAL HIGH (ref 8–23)
CO2: 24 mmol/L (ref 22–32)
Calcium: 8.4 mg/dL — ABNORMAL LOW (ref 8.9–10.3)
Chloride: 104 mmol/L (ref 98–111)
Creatinine, Ser: 5.49 mg/dL — ABNORMAL HIGH (ref 0.61–1.24)
GFR, Estimated: 9 mL/min — ABNORMAL LOW (ref >60)
Glucose, Bld: 173 mg/dL — ABNORMAL HIGH (ref 70–99)
Potassium: 3.5 mmol/L (ref 3.5–5.1)
Sodium: 141 mmol/L (ref 135–145)

## 2022-10-26 LAB — C-REACTIVE PROTEIN: CRP: 9.2 mg/dL — ABNORMAL HIGH (ref <1.0)

## 2022-10-26 LAB — GLUCOSE, CAPILLARY
Glucose-Capillary: 175 mg/dL — ABNORMAL HIGH (ref 70–99)
Glucose-Capillary: 264 mg/dL — ABNORMAL HIGH (ref 70–99)
Glucose-Capillary: 188 mg/dL — ABNORMAL HIGH (ref 70–99)
Glucose-Capillary: 215 mg/dL — ABNORMAL HIGH (ref 70–99)

## 2022-10-26 LAB — IRON AND TIBC
Iron: 23 ug/dL — ABNORMAL LOW (ref 45–182)
Saturation Ratios: 15 % — ABNORMAL LOW (ref 17.9–39.5)
TIBC: 150 ug/dL — ABNORMAL LOW (ref 250–450)
UIBC: 127 ug/dL

## 2022-10-26 LAB — HEPARIN LEVEL (UNFRACTIONATED)
Heparin Unfractionated: 0.52 IU/mL (ref 0.30–0.70)
Heparin Unfractionated: 0.85 IU/mL — ABNORMAL HIGH (ref 0.30–0.70)
Heparin Unfractionated: 0.9 IU/mL — ABNORMAL HIGH (ref 0.30–0.70)

## 2022-10-26 LAB — D-DIMER, QUANTITATIVE: D-Dimer, Quant: 1.65 ug/mL-FEU — ABNORMAL HIGH (ref 0.00–0.50)

## 2022-10-26 LAB — HEPATITIS B SURFACE ANTIBODY, QUANTITATIVE: Hep B S AB Quant (Post): 281.6 m[IU]/mL (ref >9.9)

## 2022-10-26 LAB — FERRITIN: Ferritin: 304 ng/mL (ref 24–336)

## 2022-10-26 MED ORDER — INSULIN ASPART 100 UNIT/ML IJ SOLN
0.0000 [IU] | Freq: Every day | INTRAMUSCULAR | Status: DC
  Administered 2022-10-26 – 2022-10-31 (×3): 2 [IU] via SUBCUTANEOUS
  Administered 2022-11-01: 3 [IU] via SUBCUTANEOUS

## 2022-10-26 MED ORDER — INSULIN ASPART 100 UNIT/ML IJ SOLN
0.0000 [IU] | Freq: Three times a day (TID) | INTRAMUSCULAR | Status: DC
  Administered 2022-10-26: 3 [IU] via SUBCUTANEOUS
  Administered 2022-10-27: 2 [IU] via SUBCUTANEOUS
  Administered 2022-10-27: 5 [IU] via SUBCUTANEOUS
  Administered 2022-10-27: 8 [IU] via SUBCUTANEOUS
  Administered 2022-10-28 (×2): 5 [IU] via SUBCUTANEOUS
  Administered 2022-10-29: 3 [IU] via SUBCUTANEOUS
  Administered 2022-10-29 – 2022-10-30 (×2): 2 [IU] via SUBCUTANEOUS
  Administered 2022-10-30: 3 [IU] via SUBCUTANEOUS
  Administered 2022-10-30: 8 [IU] via SUBCUTANEOUS
  Administered 2022-10-31: 3 [IU] via SUBCUTANEOUS
  Administered 2022-10-31: 5 [IU] via SUBCUTANEOUS
  Administered 2022-10-31: 8 [IU] via SUBCUTANEOUS
  Administered 2022-11-01: 11 [IU] via SUBCUTANEOUS
  Administered 2022-11-01: 3 [IU] via SUBCUTANEOUS
  Administered 2022-11-02: 2 [IU] via SUBCUTANEOUS
  Administered 2022-11-02: 5 [IU] via SUBCUTANEOUS
  Administered 2022-11-02: 11 [IU] via SUBCUTANEOUS
  Administered 2022-11-03: 3 [IU] via SUBCUTANEOUS
  Administered 2022-11-03: 5 [IU] via SUBCUTANEOUS
  Administered 2022-11-04: 2 [IU] via SUBCUTANEOUS
  Administered 2022-11-04: 3 [IU] via SUBCUTANEOUS
  Administered 2022-11-04: 5 [IU] via SUBCUTANEOUS

## 2022-10-26 MED ORDER — DARBEPOETIN ALFA 100 MCG/0.5ML IJ SOSY
100.0000 ug | PREFILLED_SYRINGE | INTRAMUSCULAR | Status: DC
  Administered 2022-10-27 – 2022-11-03 (×2): 100 ug via SUBCUTANEOUS
  Filled 2022-10-26 (×2): qty 0.5

## 2022-10-26 MED ORDER — HEPARIN (PORCINE) 25000 UT/250ML-% IV SOLN
950.0000 [IU]/h | INTRAVENOUS | Status: DC
  Administered 2022-10-26 – 2022-10-27 (×2): 950 [IU]/h via INTRAVENOUS
  Filled 2022-10-26: qty 250

## 2022-10-26 NOTE — Procedures (Signed)
1st HD tx completed today for 2Hrs, pt Tx was tolerated with cramping, UF goal of 570m was not met . Vitals remained stable throughout tx, CVC is positional and when pt moved or spoke the the lines kinked. Overall tx was tolerated well.

## 2022-10-26 NOTE — Progress Notes (Signed)
BLE venous duplex has been completed.   Results can be found under chart review under CV PROC. 10/26/2022 11:36 AM Dustyn Dansereau RVT, RDMS

## 2022-10-26 NOTE — Progress Notes (Signed)
ANTICOAGULATION CONSULT NOTE  Pharmacy Consult for Heparin infusion Indication:  COVID+ with elevated D-dimer  Allergies  Allergen Reactions   Aspirin Other (See Comments)    High doses causes stomach ulcer and bleeding    Patient Measurements: Height: '5\' 7"'$  (170.2 cm) Weight: 79.4 kg (175 lb 0.6 oz) IBW/kg (Calculated) : 66.1 Heparin Dosing Weight: 79.4 kg  Vital Signs: Temp: 98 F (36.7 C) (03/06 1946) Temp Source: Oral (03/06 1946) BP: 131/59 (03/06 1946) Pulse Rate: 69 (03/06 1946)  Labs: Recent Labs    10/25/22 0555 10/25/22 0645 10/25/22 [id number] 10/25/22 0927 10/26/22 0025 10/26/22 0625 10/26/22 1015 10/26/22 2100  HGB 8.7*  --  8.5* 8.0* 7.3*  --   --   --   HCT 25.6*  --  25.0* 24.9* 21.8*  --   --   --   PLT 325  --   --  187 193  --   --   --   APTT 35  --   --   --   --   --   --   --   LABPROT 15.2  --   --   --   --   --   --   --   INR 1.2  --   --   --   --   --   --   --   HEPARINUNFRC  --   --   --   --  0.90*  --  0.85* 0.52  CREATININE  --    < > 8.90* 7.61*  7.71*  --  5.49*  --   --    < > = values in this interval not displayed.     Estimated Creatinine Clearance: 9.8 mL/min (A) (by C-G formula based on SCr of 5.49 mg/dL (H)).   Medical History: Past Medical History:  Diagnosis Date   Acute diastolic CHF (congestive heart failure) ([place]) 09/17/2020   Acute exacerbation of CHF (congestive heart failure) ([place]) 08/04/2019   ANEMIA DUE TO CHRONIC BLOOD LOSS 03/13/2007   CAROTID ARTERY STENOSIS 05/10/2010   CHF (congestive heart failure) ([facility])    DIABETES MELLITUS, TYPE II 09/19/2007   DISEASE, CEREBROVASCULAR NEC 03/05/2007   GERD 03/13/2007   HYPERLIPIDEMIA 03/05/2007   HYPERTENSION 03/05/2007   HYPOKALEMIA 11/09/2009   KNEE PAIN, RIGHT 11/09/2009   PROSTATE CANCER, HX OF 03/05/2007   RENAL DISEASE, CHRONIC 02/03/2009    Medications:  Scheduled:   amLODipine  10 mg Oral Daily   atorvastatin  20 mg Oral Daily   Chlorhexidine  Gluconate Cloth  6 each Topical Q0600   cholecalciferol  1,000 Units Oral Daily   cloNIDine  0.3 mg Oral BID   clopidogrel  75 mg Oral Daily   [START ON 10/27/2022] darbepoetin (ARANESP) injection - DIALYSIS  100 mcg Subcutaneous Q Thu-1800   dexamethasone (DECADRON) injection  6 mg Intravenous Q24H   doxazosin  2 mg Oral QHS   folic acid  1 mg Oral Daily   insulin aspart  0-15 Units Subcutaneous TID WC   insulin aspart  0-5 Units Subcutaneous QHS   isosorbide-hydrALAZINE  1 tablet Oral TID   multivitamin with minerals  1 tablet Oral Daily   pantoprazole  40 mg Oral Daily   sodium chloride flush  3 mL Intravenous Q12H    Assessment: 88 yo M presents to ED with generalized weakness, fever, SOB and found to be COVID+ with acute hypoxic respiratory failure. D-dimer is elevated at 3.5. Pt has  worsening AKI in the setting of CKD V, progressing to need for iHD. Nephrology is initiating iHD. Pt is requiring supplemental oxygen with 4L [place]. Due to AKI, unable to obtain CT PE to assess for PE. Pt was not taking any anticoagulation prior to admission. Pharmacy consulted to dose heparin infusion for VTE ppx of non-critically ill COVID+ with elevated d-dimer, per NIH COVID guideline recommendations.   Of note, preliminary b/l LE dopplers are negative for DVT on 3/6.  3/6 PM update:  Heparin level therapeutic after rate decrease  Goal of Therapy:  Heparin level 0.3-0.7 units/ml Monitor platelets by anticoagulation protocol: Yes   Plan:  Cont heparin 950 units/hr Heparin level with AM labs  Narda Bonds, PharmD, [facility] Pharmacist Phone: [phone]

## 2022-10-26 NOTE — Progress Notes (Signed)
[facility] Progress Note   Assessment/ Plan:    AKI on Advanced CKD-->ESRD: - uremic on admission - renal US neg for obstruction - HD #1 10/25/22 - has tempcath, appreciate IR- will need to be converted to [facility] before d/c - HD #2 10/27/22   2.  Acute encephalopathy:             - likely multifactorial- uremia, COVID etc             - improving  - rest per primary   3. COVID +             - got CAP coverage             - on dex   4.  Anemia:             - will do iron panel             - will likely need ESA-- ordered for tomorrow in HD   5.  BMM:             - PTH   6.  Dispo: admitted, will need CLIP  Subjective:    Seen in room.  Much more awake and alert.  HD #1 yesterday, for HD #2 tomorrow.     Objective:   BP (!) 120/56 (BP Location: Right Arm)   Pulse (!) 56   Temp 99.3 F (37.4 C) (Oral)   Resp 20   Ht '5\' 7"'$  (1.702 m)   Wt 79.4 kg   SpO2 99%   BMI 27.41 kg/m   Physical Exam: GEN awake and alert- appears much better today HEENT EOMI PERRL NECK NO JVD PULM clear anteriorly CV RRR no rubs, soft murmur ABD soft, nontender NABS EXT 2+ pedal edema NEURO awake and alert SKIN warm and dry  Labs: BMET Recent Labs  Lab 10/25/22 0645 10/25/22 0651 10/25/22 0927 10/26/22 0625  NA 142 141 145 141  K 3.7 6.5* 3.5 3.5  CL 110 112* 111 104  CO2 18*  --  13* 24  GLUCOSE 167* 159* 166* 173*  BUN 147* >130* 153* 100*  CREATININE 7.45* 8.90* 7.61*  7.71* 5.49*  CALCIUM 8.6*  --  8.7* 8.4*   CBC Recent Labs  Lab 10/25/22 0555 10/25/22 0651 10/25/22 0927 10/26/22 0025  WBC 7.4  --  5.4 2.7*  NEUTROABS 6.8  --   --   --   HGB 8.7* 8.5* 8.0* 7.3*  HCT 25.6* 25.0* 24.9* 21.8*  MCV 82.3  --  85.3 80.1  PLT 325  --  187 193      Medications:     amLODipine  10 mg Oral Daily   atorvastatin  20 mg Oral Daily   Chlorhexidine Gluconate Cloth  6 each Topical Q0600   cholecalciferol  1,000 Units Oral Daily   cloNIDine  0.3 mg Oral BID    clopidogrel  75 mg Oral Daily   dexamethasone (DECADRON) injection  6 mg Intravenous Q24H   doxazosin  2 mg Oral QHS   folic acid  1 mg Oral Daily   insulin aspart  0-5 Units Subcutaneous QHS   insulin aspart  0-6 Units Subcutaneous TID WC   isosorbide-hydrALAZINE  1 tablet Oral TID   multivitamin with minerals  1 tablet Oral Daily   pantoprazole  40 mg Oral Daily   sodium chloride flush  3 mL Intravenous Q12H     Madelon Lips MD 10/26/2022, 11:29 AM

## 2022-10-26 NOTE — Progress Notes (Signed)
ANTICOAGULATION CONSULT NOTE - Initial Consult  Pharmacy Consult for Heparin infusion Indication:  COVID+ with elevated D-dimer, not critically-ill  Allergies  Allergen Reactions   Aspirin Other (See Comments)    High doses causes stomach ulcer and bleeding    Patient Measurements: Height: '5\' 7"'$  (170.2 cm) Weight: 79.4 kg (175 lb 0.6 oz) IBW/kg (Calculated) : 66.1 Heparin Dosing Weight: 79.4 kg  Vital Signs: Temp: 97.6 F (36.4 C) (03/06 1135) Temp Source: Oral (03/06 1135) BP: 120/53 (03/06 1135) Pulse Rate: 57 (03/06 1135)  Labs: Recent Labs    10/25/22 0555 10/25/22 0645 10/25/22 0651 10/25/22 0927 10/26/22 0025 10/26/22 0625 10/26/22 1015  HGB 8.7*  --  8.5* 8.0* 7.3*  --   --   HCT 25.6*  --  25.0* 24.9* 21.8*  --   --   PLT 325  --   --  187 193  --   --   APTT 35  --   --   --   --   --   --   LABPROT 15.2  --   --   --   --   --   --   INR 1.2  --   --   --   --   --   --   HEPARINUNFRC  --   --   --   --  0.90*  --  0.85*  CREATININE  --    < > 8.90* 7.61*  7.71*  --  5.49*  --    < > = values in this interval not displayed.     Estimated Creatinine Clearance: 9.8 mL/min (A) (by C-G formula based on SCr of 5.49 mg/dL (H)).   Medical History: Past Medical History:  Diagnosis Date   Acute diastolic CHF (congestive heart failure) ([place]) 09/17/2020   Acute exacerbation of CHF (congestive heart failure) ([place]) 08/04/2019   ANEMIA DUE TO CHRONIC BLOOD LOSS 03/13/2007   CAROTID ARTERY STENOSIS 05/10/2010   CHF (congestive heart failure) ([facility])    DIABETES MELLITUS, TYPE II 09/19/2007   DISEASE, CEREBROVASCULAR NEC 03/05/2007   GERD 03/13/2007   HYPERLIPIDEMIA 03/05/2007   HYPERTENSION 03/05/2007   HYPOKALEMIA 11/09/2009   KNEE PAIN, RIGHT 11/09/2009   PROSTATE CANCER, HX OF 03/05/2007   RENAL DISEASE, CHRONIC 02/03/2009    Medications:  Scheduled:   amLODipine  10 mg Oral Daily   atorvastatin  20 mg Oral Daily   Chlorhexidine Gluconate Cloth  6  each Topical Q0600   cholecalciferol  1,000 Units Oral Daily   cloNIDine  0.3 mg Oral BID   clopidogrel  75 mg Oral Daily   [START ON 10/27/2022] darbepoetin (ARANESP) injection - DIALYSIS  100 mcg Subcutaneous Q Thu-1800   dexamethasone (DECADRON) injection  6 mg Intravenous Q24H   doxazosin  2 mg Oral QHS   folic acid  1 mg Oral Daily   insulin aspart  0-5 Units Subcutaneous QHS   insulin aspart  0-6 Units Subcutaneous TID WC   isosorbide-hydrALAZINE  1 tablet Oral TID   multivitamin with minerals  1 tablet Oral Daily   pantoprazole  40 mg Oral Daily   sodium chloride flush  3 mL Intravenous Q12H    Assessment: 88 yo M presents to ED with generalized weakness, fever, SOB and found to be COVID+ with acute hypoxic respiratory failure. D-dimer is elevated at 3.5. Pt has worsening AKI in the setting of CKD V, progressing to need for iHD. Nephrology is initiating iHD. Pt is  requiring supplemental oxygen with 4L [place]. Due to AKI, unable to obtain CT PE to assess for PE. Pt was not taking any anticoagulation prior to admission. Pharmacy consulted to dose heparin infusion for VTE ppx of non-critically ill COVID+ with elevated d-dimer, per NIH COVID guideline recommendations.   Of note, preliminary b/l LE dopplers are negative for DVT on 3/6.  Heparin level 0.85, supratherapeutic Current heparin infusion rate: 1150 units/hr  Hgb trending down to 7.3, Plt 193 are stable Bleeding noted around HD catheter site placed on 3/5 overnight, but that has resolved this morning, per RN report  Goal of Therapy:  Heparin level 0.3-0.7 units/ml Monitor platelets by anticoagulation protocol: Yes   Plan:  Hold heparin infusion for 1 hour, then Resume heparin and decrease infusion rate to 950 units/hr Check heparin level in 8 hours Monitor daily CBC, heparin level, and for s/sx of bleeding Continue heparin infusion for a total of 14 days or until discharge, whichever is sooner. (No anticoagulation indicated  at point of discharge)  Luisa Hart, PharmD, BCPS Clinical Pharmacist 10/26/2022 12:40 PM   Please refer to [facility] for pharmacy phone number

## 2022-10-26 NOTE — Inpatient Diabetes Management (Signed)
Inpatient Diabetes Program Recommendations  AACE/ADA: New Consensus Statement on Inpatient Glycemic Control (2015)  Target Ranges:  Prepandial:   less than 140 mg/dL      Peak postprandial:   less than 180 mg/dL (1-2 hours)      Critically ill patients:  140 - 180 mg/dL    Latest Reference Range & Units 10/25/22 09:27  Hemoglobin A1C 4.8 - 5.6 % 7.4 (H)  (H): Data is abnormally high  Latest Reference Range & Units 10/25/22 08:37 10/25/22 12:10 10/25/22 17:02 10/25/22 21:35  Glucose-Capillary 70 - 99 mg/dL 143 (H) 189 (H)  1 unit Novolog  190 (H)  1 unit Novolog  213 (H)  2 units Novolog   (H): Data is abnormally high  Latest Reference Range & Units 10/26/22 06:24 10/26/22 11:32  Glucose-Capillary 70 - 99 mg/dL 175 (H)  1 unit Novolog  264 (H)  (H): Data is abnormally high   Admit with: Acute hypoxic respiratory failure, fever, COVID-pneumonia  History: DM, CKD5 (impending Dialysis)  Home DM Meds: None listed  Current Orders: Novolog 0-6 units TID AC + HS    Needs to start Dialysis this admission  MD- Note pt started on Decadron 6 mg Q24H yesterday AM  CBG 264 at Lunch today--Allowed Regular Diet  Please consider:   1. Add Carbohydrate Modified to current PO diet  2. Start low dose Novolog Meal Coverage: Novolog 3 units TID with meals HOLD if pt NPO HOLD if pt eats <50% meals    --Will follow patient during hospitalization--  Wyn Quaker RN, MSN, [place] Diabetes Coordinator Inpatient Glycemic Control Team Team Pager: [phone] (8a-5p)

## 2022-10-26 NOTE — Progress Notes (Signed)
ANTICOAGULATION CONSULT NOTE - Follow Up Consult  Pharmacy Consult for heparin Indication:  elevated D-dimer in setting of Covid  Labs: Recent Labs    10/25/22 0555 10/25/22 0645 10/25/22 0651 10/25/22 0927 10/26/22 0025  HGB 8.7*  --  8.5* 8.0* 7.3*  HCT 25.6*  --  25.0* 24.9* 21.8*  PLT 325  --   --  187 193  APTT 35  --   --   --   --   LABPROT 15.2  --   --   --   --   INR 1.2  --   --   --   --   HEPARINUNFRC  --   --   --   --  0.90*  CREATININE  --  7.45* 8.90* 7.61*  7.71*  --     Assessment: 88yo male supratherapeutic on heparin with initial dosing for Px in Covid pt w/ elevated D-dimer; no infusion issues or signs of bleeding per RN.  Goal of Therapy:  Heparin level 0.3-0.7 units/ml   Plan:  Will decrease heparin infusion by 2 units/kg/hr to 1150 units/hr and check level in 8 hours.    Wynona Neat, PharmD, BCPS  10/26/2022,2:38 AM

## 2022-10-26 NOTE — Progress Notes (Signed)
PROGRESS NOTE   Austin [name]  [id number]    DOB: Jan 08, 1935    DOA: 10/25/2022  PCP: Dorothyann Peng, NP   I have briefly reviewed patients previous medical records in [facility].  Chief Complaint  Patient presents with   Fever    Brief Narrative:  88 year old married male, independent, medical history significant for type II DM, HTN, CAD, prior GI bleed, CHF, sleep apnea, stage V CKD, recent placement of left upper extremity AV fistula on 10/18/2022, presented to the ED on 10/25/2022 with complaints of generalized weakness, not acting like himself, EMS noted fever of 102 F and oxygen saturation in the low 90s.  He was admitted for acute respiratory failure with hypoxia, COVID-19 acute bronchitis, acute kidney injury complicating stage V CKD with early uremia.  Nephrology was consulted, right TDC placed and started HD on 2/5.   Assessment & Plan:  Principal Problem:   Acute respiratory failure with hypoxia (HCC) Active Problems:   Type 2 diabetes mellitus with hyperlipidemia (HCC)   Iron deficiency anemia due to chronic blood loss   Essential hypertension   GERD   Acute kidney injury superimposed on CKD ([place])   Pneumonia due to COVID-19 virus   CKD (chronic kidney disease) stage 5, GFR less than 15 ml/min (HCC)   Severe pulmonary hypertension (HCC)   Moderate aortic regurgitation   CAD in native artery   Acute kidney injury complicating stage V CKD, possibly new ESRD with volume overload/metabolic acidosis: S/p LUE AVG placed 2/27.  Nephrology consultation appreciated.  Now likely progressed to ESRD and early uremia.  Renal ultrasound without hydronephrosis.  Had right TDC placed and initiated HD 2/5, tolerated 2 hours and then stopped due to cramps.  Management per nephrology.  Will need OP HD slot/clip.'s.  Spurious hyperkalemia, resolved.  Anion gap metabolic acidosis is improved.  COVID 19 acute bronchitis/acute respiratory failure with hypoxia: Currently on 3 L/min [place]  oxygen and saturating in the low to mid 90s.  Remains on IV Decadron 6 mg daily.  On admission, did not meet criteria for Actemra.  Wean oxygen as tolerated.  Inflammatory markers including D-dimer elevated.  D-dimer has improved.  As discussed with admitting MD, was placed on IV heparin as per pharmacy guidance due to elevated D-dimer in the context of COVID-19, to continue until D-dimer decreases.  Acute metabolic encephalopathy: Multifactorial due to ESRD with uremia, COVID-19.  Resolved.  Anemia due to ESRD: Hemoglobin has dropped from mid to low 8 g range to 7.3 in the absence of overt bleeding.  Nephrology checking iron stores and may need ESA.  Follow CBCs closely and transfuse if hemoglobin 7 g or less.  Type II DM with renal complications: [id number] 7.4.  Mildly uncontrolled and fluctuating.  Currently on very sensitive SSI.  Titrate insulins as needed, may need to increase, on steroids.  Hypertension Controlled on amlodipine, clonidine, doxazosin, BiDil.  Volume management across HD.  Dyslipidemia Continue statins.  Chronic combined CHF/severe pulmonary hypertension/moderate aortic regurgitation: Cardiac meds as above.  Volume management across HD.  CAD No angina.  Continue clopidogrel and statins.  Reported OSA Unclear if patient on CPAP prior to admission-will have to verify with him.  Prior history of GI bleeding Asymptomatic.  Body mass index is 27.41 kg/m.    DVT prophylaxis:   Added subcutaneous heparin   Code Status: Full Code:  ACP Documents: None present Family Communication: None at bedside Disposition:  Status is: Inpatient Remains inpatient appropriate because: May need  further dialysis and outpatient HD placement.     Consultants:   Nephrology Interventional radiology  Procedures:   Placement of right [facility]  Antimicrobials:   Hemodialysis   Subjective:  Patient reports feeling better.  Never had chest pain.  Mild dyspnea that he had yesterday has  resolved.  Ankle swelling improved.  Denies long distance travel, asymmetric leg swelling or pain.  Objective:   Vitals:   10/26/22 0010 10/26/22 0500 10/26/22 0700 10/26/22 0725  BP: (!) 126/56 (!) 126/51 (!) 131/47   Pulse: 66 (!) 59 (!) 59 63  Resp: '18 20 17 19  '$ Temp: 98.1 F (36.7 C)   99.3 F (37.4 C)  TempSrc: Oral   Oral  SpO2: 96% 98% 97% 97%  Weight:      Height:        General exam: Elderly male, moderately built and nourished, looks younger than stated age, sitting up comfortably in bed eating his breakfast. Respiratory system: Slightly diminished breath in the bases but otherwise clear to auscultation.  No increased work of breathing.  Right TDC without acute finding. Cardiovascular system: S1 & S2 heard, RRR. No JVD, murmurs, rubs, gallops or clicks.  Very trace bilateral ankle edema.  Telemetry personally reviewed: Sinus rhythm. Gastrointestinal system: Abdomen is nondistended, soft and nontender. No organomegaly or masses felt. Normal bowel sounds heard. Central nervous system: Alert and oriented. No focal neurological deficits. Extremities: Symmetric 5 x 5 power.  Left upper arm AVF thrill well-appreciated, site appears healed. Skin: No rashes, lesions or ulcers Psychiatry: Judgement and insight appear normal. Mood & affect appropriate.     Data Reviewed:   I have personally reviewed following labs and imaging studies   CBC: Recent Labs  Lab 10/25/22 0555 10/25/22 0651 10/25/22 0927 10/26/22 0025  WBC 7.4  --  5.4 2.7*  NEUTROABS 6.8  --   --   --   HGB 8.7* 8.5* 8.0* 7.3*  HCT 25.6* 25.0* 24.9* 21.8*  MCV 82.3  --  85.3 80.1  PLT 325  --  187 0000000    Basic Metabolic Panel: Recent Labs  Lab 10/25/22 0645 10/25/22 0651 10/25/22 0927 10/26/22 0625  NA 142 141 145 141  K 3.7 6.5* 3.5 3.5  CL 110 112* 111 104  CO2 18*  --  13* 24  GLUCOSE 167* 159* 166* 173*  BUN 147* >130* 153* 100*  CREATININE 7.45* 8.90* 7.61*  7.71* 5.49*  CALCIUM 8.6*  --   8.7* 8.4*    Liver Function Tests: Recent Labs  Lab 10/25/22 0645  AST 26  ALT 6  ALKPHOS 42  BILITOT 0.6  PROT 6.5  ALBUMIN 2.9*    CBG: Recent Labs  Lab 10/25/22 1702 10/25/22 2135 10/26/22 0624  GLUCAP 190* 213* 175*    Microbiology Studies:   Recent Results (from the past 240 hour(s))  Resp panel by RT-PCR (RSV, Flu A&B, Covid) Anterior Nasal Swab     Status: Abnormal   Collection Time: 10/25/22  5:46 AM   Specimen: Anterior Nasal Swab  Result Value Ref Range Status   SARS Coronavirus 2 by RT PCR POSITIVE (A) NEGATIVE Final   Influenza A by PCR NEGATIVE NEGATIVE Final   Influenza B by PCR NEGATIVE NEGATIVE Final    Comment: (NOTE) The Xpert Xpress SARS-CoV-2/FLU/RSV plus assay is intended as an aid in the diagnosis of influenza from Nasopharyngeal swab specimens and should not be used as a sole basis for treatment. Nasal washings and aspirates are unacceptable  for Xpert Xpress SARS-CoV-2/FLU/RSV testing.  Fact Sheet for Patients: EntrepreneurPulse.com.au  Fact Sheet for Healthcare Providers: IncredibleEmployment.be  This test is not yet approved or cleared by the [place] FDA and has been authorized for detection and/or diagnosis of SARS-CoV-2 by FDA under an Emergency Use Authorization (EUA). This EUA will remain in effect (meaning this test can be used) for the duration of the COVID-19 declaration under Section 564(b)(1) of the Act, 21 U.S.C. section 360bbb-3(b)(1), unless the authorization is terminated or revoked.     Resp Syncytial Virus by PCR NEGATIVE NEGATIVE Final    Comment: (NOTE) Fact Sheet for Patients: EntrepreneurPulse.com.au  Fact Sheet for Healthcare Providers: IncredibleEmployment.be  This test is not yet approved or cleared by the [place] FDA and has been authorized for detection and/or diagnosis of SARS-CoV-2 by FDA under an Emergency Use  Authorization (EUA). This EUA will remain in effect (meaning this test can be used) for the duration of the COVID-19 declaration under Section 564(b)(1) of the Act, 21 U.S.C. section 360bbb-3(b)(1), unless the authorization is terminated or revoked.  Performed at [facility], [place] [address], [place], [place] [address]   Blood Culture (routine x 2)     Status: None (Preliminary result)   Collection Time: 11/11/2022  6:10 AM   Specimen: BLOOD RIGHT HAND  Result Value Ref Range Status   Specimen Description BLOOD RIGHT HAND  Final   Special Requests   Final    BOTTLES DRAWN AEROBIC AND ANAEROBIC Blood Culture adequate volume   Culture   Final    NO GROWTH 1 DAY Performed at [facility], [place] [address] [place], [place] [address]    Report Status PENDING  Incomplete  Blood Culture (routine x 2)     Status: None (Preliminary result)   Collection Time: November 11, 2022  6:15 AM   Specimen: BLOOD RIGHT HAND  Result Value Ref Range Status   Specimen Description BLOOD RIGHT HAND  Final   Special Requests   Final    BOTTLES DRAWN AEROBIC AND ANAEROBIC Blood Culture results may not be optimal due to an inadequate volume of blood received in culture bottles   Culture   Final    NO GROWTH 1 DAY Performed at [facility], [place] [address], [place],  [address]    Report Status PENDING  Incomplete    Radiology Studies:  US RENAL  Result Date: 11/11/2022 CLINICAL DATA:  Acute kidney injury EXAM: RENAL / URINARY TRACT ULTRASOUND COMPLETE COMPARISON:  None Available. FINDINGS: Right Kidney: Renal measurements: 10.0 x 7.0 x 4.6 cm = volume: 167.5 mL. Lower pole the right kidney not well visualized due to patient body habitus. Echogenicity within normal limits. Small simple appearing cyst of the mid region of the right kidney measuring to 4.2 cm and 1.5 cm. No mass or hydronephrosis visualized. Left Kidney: Renal measurements: 9.9 x 5.2 x 3.9 cm = volume: 103.5 mL. Lobulated contour.  Echogenicity within normal limits. No mass or hydronephrosis visualized. Bladder: Appears normal for degree of bladder distention. Other: None. IMPRESSION: No hydronephrosis. Electronically Signed   By: Yetta Glassman M.D.   On: 11-11-2022 11:54   DG Chest Port 1 View  Result Date: 11/11/22 CLINICAL DATA:  88 year old male with possible sepsis.  Fever. EXAM: PORTABLE CHEST 1 VIEW COMPARISON:  Chest x-ray 09/26/2021. FINDINGS: Lung volumes are normal. Widespread areas of interstitial prominence and peribronchial cuffing are noted, concerning for probable acute bronchitis. No definite confluent consolidative airspace disease.  No pleural effusions. No pneumothorax. Pulmonary vasculature does not appear engorged. Heart size is mildly enlarged. Upper mediastinal contours are within normal limits allowing for patient rotation to the left. Atherosclerotic calcifications in the thoracic aorta. IMPRESSION: 1. The appearance the chest is concerning for an acute bronchitis. 2. Mild cardiomegaly. 3. Aortic atherosclerosis. Electronically Signed   By: Vinnie Langton M.D.   On: 10/25/2022 06:18    Scheduled Meds:    amLODipine  10 mg Oral Daily   atorvastatin  20 mg Oral Daily   Chlorhexidine Gluconate Cloth  6 each Topical Q0600   cholecalciferol  1,000 Units Oral Daily   cloNIDine  0.3 mg Oral BID   clopidogrel  75 mg Oral Daily   dexamethasone (DECADRON) injection  6 mg Intravenous Q24H   doxazosin  2 mg Oral QHS   folic acid  1 mg Oral Daily   insulin aspart  0-5 Units Subcutaneous QHS   insulin aspart  0-6 Units Subcutaneous TID WC   isosorbide-hydrALAZINE  1 tablet Oral TID   multivitamin with minerals  1 tablet Oral Daily   pantoprazole  40 mg Oral Daily   sodium chloride flush  3 mL Intravenous Q12H    Continuous Infusions:    heparin 1,150 Units/hr (10/26/22 0247)     LOS: 1 day     Vernell Leep, MD,  FACP, [place], [facility], [facility], [place]     To contact the attending provider between 7A-7P or the covering provider during after hours 7P-7A, please log into the web site [email or web address] and access using universal  password for that web site. If you do not have the password, please call the hospital operator.  10/26/2022, 9:34 AM

## 2022-10-27 ENCOUNTER — Encounter (HOSPITAL_COMMUNITY): Payer: Self-pay | Admitting: Family Medicine

## 2022-10-27 ENCOUNTER — Inpatient Hospital Stay (HOSPITAL_COMMUNITY): Payer: Medicare PPO

## 2022-10-27 DIAGNOSIS — U071 COVID-19: Secondary | ICD-10-CM | POA: Diagnosis not present

## 2022-10-27 DIAGNOSIS — N186 End stage renal disease: Secondary | ICD-10-CM | POA: Diagnosis not present

## 2022-10-27 DIAGNOSIS — Z992 Dependence on renal dialysis: Secondary | ICD-10-CM | POA: Diagnosis not present

## 2022-10-27 DIAGNOSIS — J9601 Acute respiratory failure with hypoxia: Secondary | ICD-10-CM | POA: Diagnosis not present

## 2022-10-27 HISTORY — PX: IR US GUIDE VASC ACCESS RIGHT: IMG2390

## 2022-10-27 HISTORY — PX: IR FLUORO GUIDE CV LINE RIGHT: IMG2283

## 2022-10-27 LAB — RENAL FUNCTION PANEL
Albumin: 2.5 g/dL — ABNORMAL LOW (ref 3.5–5.0)
Anion gap: 14 (ref 5–15)
BUN: 120 mg/dL — ABNORMAL HIGH (ref 8–23)
CO2: 21 mmol/L — ABNORMAL LOW (ref 22–32)
Calcium: 8.2 mg/dL — ABNORMAL LOW (ref 8.9–10.3)
Chloride: 97 mmol/L — ABNORMAL LOW (ref 98–111)
Creatinine, Ser: 6.75 mg/dL — ABNORMAL HIGH (ref 0.61–1.24)
GFR, Estimated: 7 mL/min — ABNORMAL LOW (ref >60)
Glucose, Bld: 268 mg/dL — ABNORMAL HIGH (ref 70–99)
Phosphorus: 7.2 mg/dL — ABNORMAL HIGH (ref 2.5–4.6)
Potassium: 4 mmol/L (ref 3.5–5.1)
Sodium: 132 mmol/L — ABNORMAL LOW (ref 135–145)

## 2022-10-27 LAB — CBC
HCT: 28.2 % — ABNORMAL LOW (ref 39.0–52.0)
HCT: 23.7 % — ABNORMAL LOW (ref 39.0–52.0)
Hemoglobin: 9 g/dL — ABNORMAL LOW (ref 13.0–17.0)
Hemoglobin: 7.6 g/dL — ABNORMAL LOW (ref 13.0–17.0)
MCH: 26.6 pg (ref 26.0–34.0)
MCH: 27 pg (ref 26.0–34.0)
MCHC: 31.9 g/dL (ref 30.0–36.0)
MCHC: 32.1 g/dL (ref 30.0–36.0)
MCV: 83.4 fL (ref 80.0–100.0)
MCV: 84.3 fL (ref 80.0–100.0)
Platelets: 143 10*3/uL — ABNORMAL LOW (ref 150–400)
Platelets: 172 10*3/uL (ref 150–400)
RBC: 3.38 MIL/uL — ABNORMAL LOW (ref 4.22–5.81)
RBC: 2.81 MIL/uL — ABNORMAL LOW (ref 4.22–5.81)
RDW: 14.5 % (ref 11.5–15.5)
RDW: 14.4 % (ref 11.5–15.5)
WBC: 4.3 10*3/uL (ref 4.0–10.5)
WBC: 5.9 10*3/uL (ref 4.0–10.5)
nRBC: 1.4 % — ABNORMAL HIGH (ref 0.0–0.2)
nRBC: 0.7 % — ABNORMAL HIGH (ref 0.0–0.2)

## 2022-10-27 LAB — D-DIMER, QUANTITATIVE: D-Dimer, Quant: 0.94 ug/mL-FEU — ABNORMAL HIGH (ref 0.00–0.50)

## 2022-10-27 LAB — HEPARIN LEVEL (UNFRACTIONATED): Heparin Unfractionated: 0.5 IU/mL (ref 0.30–0.70)

## 2022-10-27 LAB — GLUCOSE, CAPILLARY
Glucose-Capillary: 146 mg/dL — ABNORMAL HIGH (ref 70–99)
Glucose-Capillary: 213 mg/dL — ABNORMAL HIGH (ref 70–99)
Glucose-Capillary: 276 mg/dL — ABNORMAL HIGH (ref 70–99)
Glucose-Capillary: 218 mg/dL — ABNORMAL HIGH (ref 70–99)
Glucose-Capillary: 132 mg/dL — ABNORMAL HIGH (ref 70–99)

## 2022-10-27 LAB — C-REACTIVE PROTEIN: CRP: 5.5 mg/dL — ABNORMAL HIGH (ref <1.0)

## 2022-10-27 MED ORDER — HEPARIN SODIUM (PORCINE) 5000 UNIT/ML IJ SOLN
5000.0000 [IU] | Freq: Three times a day (TID) | INTRAMUSCULAR | Status: DC
  Administered 2022-10-27 – 2022-11-05 (×26): 5000 [IU] via SUBCUTANEOUS
  Filled 2022-10-27 (×28): qty 1

## 2022-10-27 MED ORDER — MIDAZOLAM HCL 2 MG/2ML IJ SOLN
INTRAMUSCULAR | Status: AC | PRN
  Administered 2022-10-27: 1 mg via INTRAVENOUS
  Administered 2022-10-27: .5 mg via INTRAVENOUS

## 2022-10-27 MED ORDER — MIDAZOLAM HCL 2 MG/2ML IJ SOLN
INTRAMUSCULAR | Status: AC
  Filled 2022-10-27: qty 2

## 2022-10-27 MED ORDER — "THROMBI-PAD 3""X3"" EX PADS"
1.0000 | MEDICATED_PAD | CUTANEOUS | Status: AC
  Filled 2022-10-27: qty 1

## 2022-10-27 MED ORDER — CEFAZOLIN SODIUM-DEXTROSE 2-4 GM/100ML-% IV SOLN
INTRAVENOUS | Status: AC
  Administered 2022-10-27: 2 g via INTRAVENOUS
  Filled 2022-10-27: qty 100

## 2022-10-27 MED ORDER — HEPARIN SODIUM (PORCINE) 1000 UNIT/ML IJ SOLN
INTRAMUSCULAR | Status: AC
  Administered 2022-10-27: 3.8 mL
  Filled 2022-10-27: qty 10

## 2022-10-27 MED ORDER — LIDOCAINE-EPINEPHRINE 1 %-1:100000 IJ SOLN
INTRAMUSCULAR | Status: AC
  Administered 2022-10-27: 10 mL
  Filled 2022-10-27: qty 1

## 2022-10-27 MED ORDER — CEFAZOLIN SODIUM-DEXTROSE 2-4 GM/100ML-% IV SOLN: 2.0000 g | INTRAVENOUS | Status: AC

## 2022-10-27 NOTE — Progress Notes (Addendum)
PROGRESS NOTE   Sarkis Sollazzo  [id number]    DOB: Apr 28, 1935    DOA: 10/25/2022  PCP: Dorothyann Peng, NP   I have briefly reviewed patients previous medical records in [facility].  Chief Complaint  Patient presents with   Fever    Brief Narrative:  88 year old married male, independent, medical history significant for type II DM, HTN, CAD, prior GI bleed, CHF, sleep apnea, stage V CKD, recent placement of left upper extremity AV fistula on 10/18/2022, presented to the ED on 10/25/2022 with complaints of generalized weakness, not acting like himself, EMS noted fever of 102 F and oxygen saturation in the low 90s.  He was admitted for acute respiratory failure with hypoxia, COVID-19 acute bronchitis, acute kidney injury complicating stage V CKD with early uremia.  Nephrology was consulted, right temporary HD catheter was placed and started HD on 2/5.   Assessment & Plan:  Principal Problem:   Acute respiratory failure with hypoxia (HCC) Active Problems:   Type 2 diabetes mellitus with hyperlipidemia (HCC)   Iron deficiency anemia due to chronic blood loss   Essential hypertension   GERD   Acute kidney injury superimposed on CKD ([employer])   Pneumonia due to COVID-19 virus   CKD (chronic kidney disease) stage 5, GFR less than 15 ml/min (HCC)   Severe pulmonary hypertension (HCC)   Moderate aortic regurgitation   CAD in native artery   Acute kidney injury complicating stage V CKD, possibly new ESRD with volume overload/metabolic acidosis: S/p LUE AVG placed 2/27.  Nephrology consultation appreciated.  Now likely progressed to ESRD and early uremia.  Renal ultrasound without hydronephrosis.  Had right temporary HD catheter placed and initiated HD 2/5, tolerated 2 hours and then stopped due to cramps.  Management per nephrology.  Will need OP HD slot/clip.  Spurious hyperkalemia, resolved.  Anion gap metabolic acidosis improved.  Plan for second HD 3/7.  Mild bleeding around HD catheter site  overnight, appears to have stopped with pressure dressing, nephrology to reassess.  Will need TDC placement prior to discharge.  COVID 19 acute bronchitis/acute respiratory failure with hypoxia: Currently on 2 L/min [place] oxygen and saturating in the high 90s.  Remains on IV Decadron 6 mg daily.  On admission, did not meet criteria for Actemra.  Wean oxygen as tolerated.  Inflammatory markers including D-dimer elevated.  D-dimer has improved.  As discussed with admitting MD 3/6, was placed on IV heparin as per pharmacy guidance due to elevated D-dimer in the context of COVID-19, to continue until D-dimer decreases (Pharmacy was consulted to dose heparin infusion for VTE ppx of non-critically ill COVID+ with elevated d-dimer, per NIH COVID guideline recommendations.).  D-dimer down to 0.94.  Bilateral lower extremity venous Dopplers negative for DVT.  Addendum: This afternoon, Dr. Hollie Salk updated that patient was bleeding from HD catheter site due to a cut in it.  At the same time, had extensive communication with pharmacy who had contacted this provider to recommend discontinuing heparin (please refer to the detailed note from 3/7).  Given that patient had low index of suspicion for PE to begin with, negative lower extremity venous Dopplers, decreasing D-dimer, no bleeding from HD catheter site, and pharmacy recommendations, discontinued during anticoagulation and changed to prophylactic dose.  Acute metabolic encephalopathy: Multifactorial due to ESRD with uremia, COVID-19.  Resolved.  Anemia due to ESRD: Hemoglobin has dropped from mid to low 8 g range to 7.3 in the absence of overt bleeding.  Nephrology checking iron stores  and may need ESA.  Follow CBCs closely and transfuse if hemoglobin 7 g or less.  Hemoglobin up to 9.  Type II DM with renal complications: [id number] 7.4.  Mildly uncontrolled and fluctuating.  Titrate insulins as needed, may need to increase, on steroids.  SSI was increased from very  sensitive to moderate scale with improved control.  Hypertension Controlled on amlodipine, clonidine, doxazosin, BiDil.  Volume management across HD.  Dyslipidemia Continue statins.  Chronic combined CHF/severe pulmonary hypertension/moderate aortic regurgitation: Cardiac meds as above.  Volume management across HD.  CAD No angina.  Continue clopidogrel and statins.  Reported OSA Unclear if patient on CPAP prior to admission-will have to verify with him.  Prior history of GI bleeding Asymptomatic.  Body mass index is 28.21 kg/m.    DVT prophylaxis:   Added subcutaneous heparin   Code Status: Full Code:  ACP Documents: None present Family Communication: None at bedside Disposition:  Status is: Inpatient Remains inpatient appropriate because: Will need need further inpatient dialysis and outpatient HD placement.     Consultants:   Nephrology Interventional radiology  Procedures:   Placement of right temporary HD catheter  Antimicrobials:   Hemodialysis 3/5,   Subjective:  Mild intermittent dry cough.  No dyspnea or pain.  Has not been out of bed.  No other complaints reported.  Objective:   Vitals:   10/26/22 1946 10/27/22 0047 10/27/22 0541 10/27/22 0727  BP: (!) 131/59 (!) 125/59 (!) 124/49 (!) 124/53  Pulse: 69 (!) 58 60 (!) 56  Resp: '16 16 17 17  '$ Temp: 98 F (36.7 C) 99.2 F (37.3 C) 99.2 F (37.3 C) 99.4 F (37.4 C)  TempSrc: Oral Oral Oral Oral  SpO2:  97%  97%  Weight:   81.7 kg   Height:        General exam: Elderly male, moderately built and nourished, looks younger than stated age, lying comfortable propped up in bed without distress. Respiratory system: Clear diminished breath sounds in the bases but otherwise clear to auscultation.  No increased work of breathing.  Right temporary HD catheter site with mild bloodsoaked dressing but no active bleeding. Cardiovascular system: S1 & S2 heard, RRR. No JVD, murmurs, rubs, gallops or clicks.   Very trace bilateral ankle edema.  Does have some bilateral upper extremity edema, left more than right. Gastrointestinal system: Abdomen is nondistended, soft and nontender. No organomegaly or masses felt. Normal bowel sounds heard. Central nervous system: Alert and oriented. No focal neurological deficits. Extremities: Symmetric 5 x 5 power.  Left upper arm AVF thrill well-appreciated, site appears healed. Skin: No rashes, lesions or ulcers Psychiatry: Judgement and insight appear normal. Mood & affect appropriate.     Data Reviewed:   I have personally reviewed following labs and imaging studies   CBC: Recent Labs  Lab 10/25/22 0555 10/25/22 0651 10/25/22 0927 10/26/22 0025 10/27/22 0500  WBC 7.4  --  5.4 2.7* 4.3  NEUTROABS 6.8  --   --   --   --   HGB 8.7*   < > 8.0* 7.3* 9.0*  HCT 25.6*   < > 24.9* 21.8* 28.2*  MCV 82.3  --  85.3 80.1 83.4  PLT 325  --  187 193 143*   < > = values in this interval not displayed.    Basic Metabolic Panel: Recent Labs  Lab 10/25/22 0645 10/25/22 0651 10/25/22 0927 10/26/22 0625  NA 142 141 145 141  K 3.7 6.5* 3.5 3.5  CL  110 112* 111 104  CO2 18*  --  13* 24  GLUCOSE 167* 159* 166* 173*  BUN 147* >130* 153* 100*  CREATININE 7.45* 8.90* 7.61*  7.71* 5.49*  CALCIUM 8.6*  --  8.7* 8.4*    Liver Function Tests: Recent Labs  Lab 10/25/22 0645  AST 26  ALT 6  ALKPHOS 42  BILITOT 0.6  PROT 6.5  ALBUMIN 2.9*    CBG: Recent Labs  Lab 10/26/22 1608 10/26/22 2116 10/27/22 0626  GLUCAP 188* 215* 146*    Microbiology Studies:   Recent Results (from the past 240 hour(s))  Resp panel by RT-PCR (RSV, Flu A&B, Covid) Anterior Nasal Swab     Status: Abnormal   Collection Time: 10/25/22  5:46 AM   Specimen: Anterior Nasal Swab  Result Value Ref Range Status   SARS Coronavirus 2 by RT PCR POSITIVE (A) NEGATIVE Final   Influenza A by PCR NEGATIVE NEGATIVE Final   Influenza B by PCR NEGATIVE NEGATIVE Final    Comment:  (NOTE) The Xpert Xpress SARS-CoV-2/FLU/RSV plus assay is intended as an aid in the diagnosis of influenza from Nasopharyngeal swab specimens and should not be used as a sole basis for treatment. Nasal washings and aspirates are unacceptable for Xpert Xpress SARS-CoV-2/FLU/RSV testing.  Fact Sheet for Patients: EntrepreneurPulse.com.au  Fact Sheet for Healthcare Providers: IncredibleEmployment.be  This test is not yet approved or cleared by the [place] FDA and has been authorized for detection and/or diagnosis of SARS-CoV-2 by FDA under an Emergency Use Authorization (EUA). This EUA will remain in effect (meaning this test can be used) for the duration of the COVID-19 declaration under Section 564(b)(1) of the Act, 21 U.S.C. section 360bbb-3(b)(1), unless the authorization is terminated or revoked.     Resp Syncytial Virus by PCR NEGATIVE NEGATIVE Final    Comment: (NOTE) Fact Sheet for Patients: EntrepreneurPulse.com.au  Fact Sheet for Healthcare Providers: IncredibleEmployment.be  This test is not yet approved or cleared by the [place] FDA and has been authorized for detection and/or diagnosis of SARS-CoV-2 by FDA under an Emergency Use Authorization (EUA). This EUA will remain in effect (meaning this test can be used) for the duration of the COVID-19 declaration under Section 564(b)(1) of the Act, 21 U.S.C. section 360bbb-3(b)(1), unless the authorization is terminated or revoked.  Performed at [facility], [place] [address], [place], [place] [address]   Blood Culture (routine x 2)     Status: None (Preliminary result)   Collection Time: 10/25/22  6:10 AM   Specimen: BLOOD RIGHT HAND  Result Value Ref Range Status   Specimen Description BLOOD RIGHT HAND  Final   Special Requests   Final    BOTTLES DRAWN AEROBIC AND ANAEROBIC Blood Culture adequate volume   Culture   Final    NO  GROWTH 1 DAY Performed at [facility], [place] [address], [place], [place] [address]    Report Status PENDING  Incomplete  Blood Culture (routine x 2)     Status: None (Preliminary result)   Collection Time: 10/25/22  6:15 AM   Specimen: BLOOD RIGHT HAND  Result Value Ref Range Status   Specimen Description BLOOD RIGHT HAND  Final   Special Requests   Final    BOTTLES DRAWN AEROBIC AND ANAEROBIC Blood Culture results may not be optimal due to an inadequate volume of blood received in culture bottles   Culture   Final    NO GROWTH 1 DAY Performed at  [facility], [place] [address], [place], [place] [address]    Report Status PENDING  Incomplete    Radiology Studies:  IR Fluoro Guide CV Line Right  Result Date: 10/26/2022 INDICATION: 88 year old male with history of acute on chronic kidney disease requiring hemodialysis. EXAM: NON-TUNNELED CENTRAL VENOUS HEMODIALYSIS CATHETER PLACEMENT WITH ULTRASOUND AND FLUOROSCOPIC GUIDANCE COMPARISON:  None Available. MEDICATIONS: None FLUOROSCOPY TIME:  Two mGy COMPLICATIONS: None immediate. PROCEDURE: Informed written consent was obtained from the patient after a discussion of the risks, benefits, and alternatives to treatment. Questions regarding the procedure were encouraged and answered. The right neck and chest were prepped with chlorhexidine in a sterile fashion, and a sterile drape was applied covering the operative field. Maximum barrier sterile technique with sterile gowns and gloves were used for the procedure. A timeout was performed prior to the initiation of the procedure. After the overlying soft tissues were anesthetized, a small venotomy incision was created and a micropuncture kit was utilized to access the internal jugular vein. Real-time ultrasound guidance was utilized for vascular access including the acquisition of a permanent ultrasound image documenting patency of the accessed vessel. The microwire was utilized to measure  appropriate catheter length. A stiff glidewire was advanced to the level of the IVC. Under fluoroscopic guidance, the venotomy was serially dilated, ultimately allowing placement of a 20 cm temporary Trialysis catheter with tip ultimately terminating within the superior aspect of the right atrium. Final catheter positioning was confirmed and documented with a spot radiographic image. The catheter aspirates and flushes normally. The catheter was flushed with appropriate volume heparin dwells. The catheter exit site was secured with a 0-Prolene retention suture. A dressing was placed. The patient tolerated the procedure well without immediate post procedural complication. IMPRESSION: Successful placement of a right internal jugular approach 20 cm temporary dialysis catheter with tip terminating with in the superior aspect of the right atrium. The catheter is ready for immediate use. PLAN: This catheter may be converted to a tunneled dialysis catheter at a later date as indicated. Ruthann Cancer, MD [facility] Radiology Electronically Signed   By: Ruthann Cancer M.D.   On: 10/26/2022 13:06   IR US Guide Vasc Access Right  Result Date: 10/26/2022 INDICATION: 88 year old male with history of acute on chronic kidney disease requiring hemodialysis. EXAM: NON-TUNNELED CENTRAL VENOUS HEMODIALYSIS CATHETER PLACEMENT WITH ULTRASOUND AND FLUOROSCOPIC GUIDANCE COMPARISON:  None Available. MEDICATIONS: None FLUOROSCOPY TIME:  Two mGy COMPLICATIONS: None immediate. PROCEDURE: Informed written consent was obtained from the patient after a discussion of the risks, benefits, and alternatives to treatment. Questions regarding the procedure were encouraged and answered. The right neck and chest were prepped with chlorhexidine in a sterile fashion, and a sterile drape was applied covering the operative field. Maximum barrier sterile technique with sterile gowns and gloves were used for  the procedure. A timeout was performed prior to the initiation of the procedure. After the overlying soft tissues were anesthetized, a small venotomy incision was created and a micropuncture kit was utilized to access the internal jugular vein. Real-time ultrasound guidance was utilized for vascular access including the acquisition of a permanent ultrasound image documenting patency of the accessed vessel. The microwire was utilized to measure appropriate catheter length. A stiff glidewire was advanced to the level of the IVC. Under fluoroscopic guidance, the venotomy was serially dilated, ultimately allowing placement of a 20 cm temporary Trialysis catheter with tip ultimately terminating within the superior aspect of the right atrium.  Final catheter positioning was confirmed and documented with a spot radiographic image. The catheter aspirates and flushes normally. The catheter was flushed with appropriate volume heparin dwells. The catheter exit site was secured with a 0-Prolene retention suture. A dressing was placed. The patient tolerated the procedure well without immediate post procedural complication. IMPRESSION: Successful placement of a right internal jugular approach 20 cm temporary dialysis catheter with tip terminating with in the superior aspect of the right atrium. The catheter is ready for immediate use. PLAN: This catheter may be converted to a tunneled dialysis catheter at a later date as indicated. Ruthann Cancer, MD [facility] Radiology Electronically Signed   By: Ruthann Cancer M.D.   On: 10/26/2022 13:06   VAS [place] LOWER EXTREMITY VENOUS (DVT)  Result Date: 10/26/2022  Lower Venous DVT Study Patient Name:  YOANDRI ANDERER  Date of Exam:   10/26/2022 Medical Rec #: [id number]   Accession #:    [id number] Date of Birth: 01-27-1935   Patient Gender: M Patient Age:   89 years Exam Location:  [facility] Procedure:      VAS [place] LOWER EXTREMITY VENOUS  (DVT) Referring Phys: Murray Hodgkins --------------------------------------------------------------------------------  Indications: Elevated D-dimer in setting of Covid (1.65).  Limitations: Poor ultrasound/tissue interface. Comparison Study: Previous exam on 09/18/20 was negative for DVT Performing Technologist: Rogelia Rohrer RVT, RDMS  Examination Guidelines: A complete evaluation includes B-mode imaging, spectral Doppler, color Doppler, and power Doppler as needed of all accessible portions of each vessel. Bilateral testing is considered an integral part of a complete examination. Limited examinations for reoccurring indications may be performed as noted. The reflux portion of the exam is performed with the patient in reverse Trendelenburg.  +---------+---------------+---------+-----------+----------+--------------+ RIGHT    CompressibilityPhasicitySpontaneityPropertiesThrombus Aging +---------+---------------+---------+-----------+----------+--------------+ CFV      Full           Yes      Yes                                 +---------+---------------+---------+-----------+----------+--------------+ SFJ      Full                                                        +---------+---------------+---------+-----------+----------+--------------+ FV Prox  Full           Yes      Yes                                 +---------+---------------+---------+-----------+----------+--------------+ FV Mid   Full           Yes      Yes                                 +---------+---------------+---------+-----------+----------+--------------+ FV DistalFull           Yes      Yes                                 +---------+---------------+---------+-----------+----------+--------------+ PFV      Full                                                        +---------+---------------+---------+-----------+----------+--------------+  POP      Full           Yes      Yes                                  +---------+---------------+---------+-----------+----------+--------------+ PTV      Full                                                        +---------+---------------+---------+-----------+----------+--------------+ PERO     Full                                                        +---------+---------------+---------+-----------+----------+--------------+   +---------+---------------+---------+-----------+----------+--------------+ LEFT     CompressibilityPhasicitySpontaneityPropertiesThrombus Aging +---------+---------------+---------+-----------+----------+--------------+ CFV      Full           Yes      Yes                                 +---------+---------------+---------+-----------+----------+--------------+ SFJ      Full                                                        +---------+---------------+---------+-----------+----------+--------------+ FV Prox  Full           Yes      Yes                                 +---------+---------------+---------+-----------+----------+--------------+ FV Mid   Full           Yes      Yes                                 +---------+---------------+---------+-----------+----------+--------------+ FV DistalFull           Yes      Yes                                 +---------+---------------+---------+-----------+----------+--------------+ PFV      Full                                                        +---------+---------------+---------+-----------+----------+--------------+ POP      Full           Yes      Yes                                 +---------+---------------+---------+-----------+----------+--------------+ PTV  Full                                                        +---------+---------------+---------+-----------+----------+--------------+ PERO     Full                                                         +---------+---------------+---------+-----------+----------+--------------+     Summary: BILATERAL: - No evidence of deep vein thrombosis seen in the lower extremities, bilaterally. - RIGHT: - No cystic structure found in the popliteal fossa.  LEFT: - A cystic structure is found in the popliteal fossa (4.63 x 1.06 x 3.32 cm).  *See table(s) above for measurements and observations. Electronically signed by Jamelle Haring on 10/26/2022 at 12:38:28 PM.    Final    US RENAL  Result Date: 10/25/2022 CLINICAL DATA:  Acute kidney injury EXAM: RENAL / URINARY TRACT ULTRASOUND COMPLETE COMPARISON:  None Available. FINDINGS: Right Kidney: Renal measurements: 10.0 x 7.0 x 4.6 cm = volume: 167.5 mL. Lower pole the right kidney not well visualized due to patient body habitus. Echogenicity within normal limits. Small simple appearing cyst of the mid region of the right kidney measuring to 4.2 cm and 1.5 cm. No mass or hydronephrosis visualized. Left Kidney: Renal measurements: 9.9 x 5.2 x 3.9 cm = volume: 103.5 mL. Lobulated contour. Echogenicity within normal limits. No mass or hydronephrosis visualized. Bladder: Appears normal for degree of bladder distention. Other: None. IMPRESSION: No hydronephrosis. Electronically Signed   By: Yetta Glassman M.D.   On: 10/25/2022 11:54    Scheduled Meds:    amLODipine  10 mg Oral Daily   atorvastatin  20 mg Oral Daily   Chlorhexidine Gluconate Cloth  6 each Topical Q0600   cholecalciferol  1,000 Units Oral Daily   cloNIDine  0.3 mg Oral BID   clopidogrel  75 mg Oral Daily   darbepoetin (ARANESP) injection - DIALYSIS  100 mcg Subcutaneous Q Thu-1800   dexamethasone (DECADRON) injection  6 mg Intravenous Q24H   doxazosin  2 mg Oral QHS   folic acid  1 mg Oral Daily   insulin aspart  0-15 Units Subcutaneous TID WC   insulin aspart  0-5 Units Subcutaneous QHS   isosorbide-hydrALAZINE  1 tablet Oral TID   multivitamin with minerals  1 tablet Oral Daily   pantoprazole  40 mg  Oral Daily   sodium chloride flush  3 mL Intravenous Q12H    Continuous Infusions:    heparin 950 Units/hr (10/27/22 0508)     LOS: 2 days     Vernell Leep, MD,  FACP, FHM, [facility], [facility], [place]     To contact the attending provider between 7A-7P or the covering provider during after hours 7P-7A, please log into the web site [email or web address] and access using universal [place] password for that web site. If you do not have the password, please call the hospital operator.  10/27/2022, 8:48 AM

## 2022-10-27 NOTE — Progress Notes (Signed)
Called to bedside- bleeding from catheter RRT and primary RN in room Catheter has a slice in it- bigger than a pinhole Clamped- bleeding controlled Placed pt NPO, stat labs, I called IR for urgent intervention- greatly appreciate assistance Hep gtt paused  Madelon Lips MD [place] [facility] [phone]

## 2022-10-27 NOTE — Progress Notes (Addendum)
ANTICOAGULATION CONSULT NOTE  Pharmacy Consult for Heparin infusion Indication:  COVID+ with elevated D-dimer  Allergies  Allergen Reactions   Aspirin Other (See Comments)    High doses causes stomach ulcer and bleeding    Patient Measurements: Height: '5\' 7"'$  (170.2 cm) Weight: 81.7 kg (180 lb 1.9 oz) IBW/kg (Calculated) : 66.1 Heparin Dosing Weight: 79.4 kg  Vital Signs: Temp: 99.4 F (37.4 C) (03/07 0727) Temp Source: Oral (03/07 0727) BP: 124/53 (03/07 0727) Pulse Rate: 56 (03/07 0727)  Labs: Recent Labs    10/25/22 0555 10/25/22 0645 10/25/22 [id number] 10/25/22 0927 10/25/22 0927 10/26/22 0025 10/26/22 0625 10/26/22 1015 10/26/22 2100 10/27/22 0500  HGB 8.7*  --  8.5* 8.0*  --  7.3*  --   --   --  9.0*  HCT 25.6*  --  25.0* 24.9*  --  21.8*  --   --   --  28.2*  PLT 325  --   --  187  --  193  --   --   --  143*  APTT 35  --   --   --   --   --   --   --   --   --   LABPROT 15.2  --   --   --   --   --   --   --   --   --   INR 1.2  --   --   --   --   --   --   --   --   --   HEPARINUNFRC  --   --   --   --    < > 0.90*  --  0.85* 0.52 0.50  CREATININE  --    < > 8.90* 7.61*  7.71*  --   --  5.49*  --   --   --    < > = values in this interval not displayed.    Estimated Creatinine Clearance: 9.9 mL/min (A) (by C-G formula based on SCr of 5.49 mg/dL (H)).   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 LOSS 03/13/2007   CAROTID ARTERY STENOSIS 05/10/2010   CHF (congestive heart failure) ([facility])    DIABETES MELLITUS, TYPE II 09/19/2007   DISEASE, CEREBROVASCULAR NEC 03/05/2007   GERD 03/13/2007   HYPERLIPIDEMIA 03/05/2007   HYPERTENSION 03/05/2007   HYPOKALEMIA 11/09/2009   KNEE PAIN, RIGHT 11/09/2009   PROSTATE CANCER, HX OF 03/05/2007   RENAL DISEASE, CHRONIC 02/03/2009     Medications:  Scheduled:   amLODipine  10 mg Oral Daily   atorvastatin  20 mg Oral Daily   Chlorhexidine Gluconate Cloth  6 each Topical Q0600   cholecalciferol  1,000 Units Oral Daily   cloNIDine  0.3 mg Oral BID   clopidogrel  75 mg Oral Daily   darbepoetin (ARANESP) injection - DIALYSIS  100 mcg Subcutaneous Q Thu-1800   dexamethasone (DECADRON) injection  6 mg Intravenous Q24H   doxazosin  2 mg Oral QHS   folic acid  1 mg Oral Daily   insulin aspart  0-15 Units Subcutaneous TID WC   insulin aspart  0-5 Units Subcutaneous QHS   isosorbide-hydrALAZINE  1 tablet Oral TID   multivitamin with minerals  1 tablet Oral Daily   pantoprazole  40 mg Oral Daily  sodium chloride flush  3 mL Intravenous Q12H    Assessment: 88 yo M presents to ED with generalized weakness, fever, SOB and found to be COVID+ with acute hypoxic respiratory failure. D-dimer was elevated at 3.5. Pt presented with worsening AKI in the setting of CKD V, progressing to need for iHD and required supplemental oxygen with 4L [place]. Due to AKI, were unable to obtain CT PE to assess for PE. Pt was not taking any anticoagulation prior to admission. Pharmacy was consulted to dose heparin infusion for VTE ppx of non-critically ill COVID+ with elevated d-dimer, per NIH COVID guideline recommendations. Of note, preliminary b/l LE dopplers were negative for DVT on 3/6.  Heparin level this AM is therapeutic for goal at 0.50 on current rate of 950 units/hr.  Platelets down this AM (day 3 of heparin therapy) - will monitor. Remains on Plavix. Hemoglobin has improved at 9 without transfusion.  D-dimer and CRP are trending down at 0.94.  Noted some bleeding around new HD catheter on 3/6 but appears to have resolved per RN inspection, although she does note that it is hard to tell with guaze around site but no fresh blood seen.   Goal of Therapy:  Heparin level 0.3-0.7 units/ml Monitor platelets by anticoagulation protocol: Yes    Plan:  Continue heparin 950 units/hr. Daily heparin level and CBC while on therapy. Continue to monitor for any bleeding specifically around new HD catheter site.  Addendum:  Patient now with relative contraindication of ongoing bleeding from HD catheter site and low clinical suspicion for PE with negative LE dopplers, improved respiratory status, and D-dimer down. Discussed with primary team Dr. Algis Liming today and plan is to discontinue therapeutic anticoagulation following [place] guidance and change to VTE prophylaxis dosing only.   Plan:  Stop IV Heparin.  Start Unfractionated Heparin 5000 units subcutaneous every 8 hours for VTE prophylaxis.  Pharmacy will sign off Heparin consult. Please re-consult if needed.   Sloan Leiter, PharmD, BCPS, BCCCP Clinical Pharmacist Please refer to [facility] for [place] numbers 10/27/2022, 7:53 AM

## 2022-10-27 NOTE — Consult Note (Signed)
Chief Complaint: Patient was seen in consultation today for conversion of temporary HD catheter to tunneled at the request of Madelon Lips  Referring Physician(s): Madelon Lips  Supervising Physician: Ruthann Cancer  Patient Status: [facility] - In-pt  History of Present Illness: Austin [name] is a 88 y.o. male who presented to [facility] ED 10/25/22 c/o generalized weakness.   Pt was found to be febrile at 102 F with O2 saturation in low 90's.   He was admitted for acute respiratory failure, Covid-19 acute bronchitis and AKI complicating stage V CKD. Pt underwent right temp HD cath and started HD 3/5.   IR received call today reporting a hole in the catheter with constant oozing of blood.   Pt to return to IR today for conversion of temp cath to tunneled HD catheter. Procedure approved by Dr. Serafina Royals.   He is NOT NPO so he will only have one medication and local anesthesia.  Past Medical History:  Diagnosis Date   Acute diastolic CHF (congestive heart failure) ([place]) 09/17/2020   Acute exacerbation of CHF (congestive heart failure) ([place]) 08/04/2019   ANEMIA DUE TO CHRONIC BLOOD LOSS 03/13/2007   CAROTID ARTERY STENOSIS 05/10/2010   CHF (congestive heart failure) ([place])    DIABETES MELLITUS, TYPE II 09/19/2007   DISEASE, CEREBROVASCULAR NEC 03/05/2007   GERD 03/13/2007   HYPERLIPIDEMIA 03/05/2007   HYPERTENSION 03/05/2007   HYPOKALEMIA 11/09/2009   KNEE PAIN, RIGHT 11/09/2009   PROSTATE CANCER, HX OF 03/05/2007   RENAL DISEASE, CHRONIC 02/03/2009    Past Surgical History:  Procedure Laterality Date   AV FISTULA PLACEMENT Left 10/18/2022   Procedure: INSERTION OF LEFT ARM BRACHIAL ARTERY TO AXILLARY VEIN ARTERIOVENOUS (AV) GORE-TEX GRAFT;  Surgeon: Broadus John, MD;  Location: [place];  Service: Vascular;  Laterality: Left;   CAROTID ARTERY ANGIOPLASTY Right Oct. 10, 2001   ESOPHAGOGASTRODUODENOSCOPY (EGD) WITH PROPOFOL N/A 11/04/2016   Procedure: ESOPHAGOGASTRODUODENOSCOPY  (EGD) WITH PROPOFOL;  Surgeon: Otis Brace, MD;  Location: [facility];  Service: Gastroenterology;  Laterality: N/A;   IR FLUORO GUIDE CV LINE RIGHT  10/25/2022   IR US GUIDE VASC ACCESS RIGHT  10/25/2022   LAPAROSCOPIC APPENDECTOMY  02/20/2012   Procedure: APPENDECTOMY LAPAROSCOPIC;  Surgeon: Stark Klein, MD;  Location: [place];  Service: General;  Laterality: N/A;   PROSTATE SURGERY     prostatectomy    Allergies: Aspirin  Medications: Prior to Admission medications   Medication Sig Start Date End Date Taking? Authorizing Provider  acetaminophen (TYLENOL) 500 MG tablet Take 1,000 mg by mouth 2 (two) times daily.   Yes [provider]  amLODipine (NORVASC) 10 MG tablet TAKE 1 TABLET BY MOUTH EVERY DAY Patient taking differently: Take 10 mg by mouth daily. 05/26/22  Yes Nafziger, Tommi Rumps, NP  atorvastatin (LIPITOR) 20 MG tablet TAKE 1 TABLET BY MOUTH EVERY DAY 12/23/21  Yes Nafziger, Tommi Rumps, NP  cholecalciferol (VITAMIN D3) 25 MCG (1000 UNIT) tablet Take 1,000 Units by mouth daily.   Yes [provider]  ACCU-CHEK GUIDE test strip USE TO CHECK BLOOD GLUCOSE TWICE A DAY OR AS NEEDED 06/16/22   Nafziger, Tommi Rumps, NP  Accu-Chek Softclix Lancets lancets USED TO CHECK BLOOD GLUCOSE TWICE A DAY OR AS NEEDED 03/08/22   Nafziger, Tommi Rumps, NP  albuterol (VENTOLIN HFA) 108 (90 Base) MCG/ACT inhaler TAKE 2 PUFFS BY MOUTH EVERY 6 HOURS AS NEEDED FOR WHEEZE OR SHORTNESS OF BREATH Patient not taking: Reported on 10/25/2022 09/13/22   Collene Gobble, MD  cloNIDine (CATAPRES) 0.3  MG tablet Take 1 tablet (0.3 mg total) by mouth 3 (three) times daily. Patient taking differently: Take 0.3 mg by mouth 2 (two) times daily. 09/20/20   Harold Hedge, MD  clopidogrel (PLAVIX) 75 MG tablet TAKE 1 TABLET BY MOUTH EVERY DAY Patient taking differently: Take 75 mg by mouth daily. 07/12/22   Nafziger, Tommi Rumps, NP  doxazosin (CARDURA) 2 MG tablet Take 2 mg by mouth at bedtime.  12/28/17   [provider]   esomeprazole (NEXIUM) 40 MG capsule TAKE 1 CAPSULE BY MOUTH EVERY DAY 03/15/22   Nafziger, Tommi Rumps, NP  eszopiclone (LUNESTA) 1 MG TABS tablet Take 1 tablet (1 mg total) by mouth at bedtime as needed for sleep. Take immediately before bedtime Patient not taking: Reported on 10/14/2022 11/26/21   Laurin Coder, MD  guaiFENesin (MUCINEX) 600 MG 12 hr tablet Take 600 mg by mouth at bedtime.    [provider]  hydrocortisone (ANUSOL-HC) 2.5 % rectal cream Place 1 application rectally 2 (two) times daily. Patient not taking: Reported on 10/14/2022 10/12/21   Dorothyann Peng, NP  isosorbide-hydrALAZINE (BIDIL) 20-37.5 MG tablet Take 1 tablet by mouth 3 (three) times daily.    [provider]  lactulose (CHRONULAC) 10 GM/15ML solution TAKE 15ML BY MOUTH 2 TIMES DAILY AS NEEDED FOR MILD CONSTIPATION. Patient taking differently: Take 10 g by mouth See admin instructions. Take 10 g every 2 to 3 days with the citrucel powder 08/16/22   Nafziger, Tommi Rumps, NP  Melatonin 10 MG TABS Take 10 mg by mouth at bedtime.    [provider]  methylcellulose (CITRUCEL) oral powder 1 tablespoon every 2-3 days,alternating with lactulose    [provider]  metolazone (ZAROXOLYN) 5 MG tablet Take 5 mg by mouth once a week. 02/14/22   [provider]  metoprolol succinate (TOPROL-XL) 100 MG 24 hr tablet Take 1 tablet (100 mg total) by mouth daily. Take with or immediately following a meal. Patient not taking: Reported on 10/14/2022 09/21/20 10/14/22  Harold Hedge, MD  neomycin-polymyxin b-dexamethasone (MAXITROL) 3.5-10000-0.1 SUSP Place 1 drop into both eyes 4 (four) times daily. 10/13/22   [provider]  oxyCODONE-acetaminophen (PERCOCET/ROXICET) 5-325 MG tablet Take 1 tablet by mouth every 6 (six) hours as needed. 10/18/22   Ulyses Amor, PA-C  potassium chloride (KLOR-CON) 10 MEQ tablet Take 20 mEq by mouth 2 (two) times daily.    [provider]  potassium  chloride SA (KLOR-CON M) 20 MEQ tablet Take 1 tablet (20 mEq total) by mouth 2 (two) times daily. Patient not taking: Reported on 10/14/2022 09/30/21   Arrien, Jimmy Picket, MD  Propylene Glycol (SYSTANE BALANCE OP) Place 1 drop into both eyes at bedtime.    [provider]  torsemide (DEMADEX) 20 MG tablet Take 2 tablets (40 mg total) by mouth 2 (two) times daily. 09/30/21 10/14/22  Arrien, Jimmy Picket, MD  triamcinolone cream (KENALOG) 0.1 % Apply 1 application topically daily as needed (dry/irritated skin). 07/05/19   [provider]     Family History  Problem Relation Age of Onset   Hypertension Mother    Cancer Father        Mesothelioma    Stomach cancer Brother    Cancer Brother    Cancer - Cervical Brother    Cancer Brother    Diabetes Brother    Esophageal cancer Neg Hx    Colon cancer Neg Hx    Pancreatic cancer Neg Hx  Social History   Socioeconomic History   Marital status: Married    Spouse name: Not on file   Number of children: Not on file   Years of education: Not on file   Highest education level: Not on file  Occupational History   Not on file  Tobacco Use   Smoking status: Former    Packs/day: 1.00    Years: 10.00    Total pack years: 10.00    Types: Cigarettes    Quit date: 08/22/1974    Years since quitting: 48.2   Smokeless tobacco: Never  Vaping Use   Vaping Use: Never used  Substance and Sexual Activity   Alcohol use: No    Alcohol/week: 0.0 standard drinks of alcohol   Drug use: No   Sexual activity: Not Currently  Other Topics Concern   Not on file  Social History Narrative   Retired - Environmental consultant    Married 58 years       He enjoys traveling    Investment banker, operational of Radio broadcast assistant Strain: Low Risk  (02/10/2022)   Overall Financial Resource Strain (CARDIA)    Difficulty of Paying Living Expenses: Not hard at all  Food Insecurity: No Food Insecurity (07/22/2022)   Hunger Vital Sign    Worried  About Running Out of Food in the Last Year: Never true    [place] in the Last Year: Never true  Transportation Needs: No Transportation Needs (07/27/2022)   PRAPARE - Hydrologist (Medical): No    Lack of Transportation (Non-Medical): No  Physical Activity: Insufficiently Active (02/10/2022)   Exercise Vital Sign    Days of Exercise per Week: 2 days    Minutes of Exercise per Session: 30 min  Stress: No Stress Concern Present (02/10/2022)   [place]    Feeling of Stress : Not at all  Social Connections: [place] (02/10/2022)   Social Connection and Isolation Panel [NHANES]    Frequency of Communication with Friends and Family: More than three times a week    Frequency of Social Gatherings with Friends and Family: More than three times a week    Attends Religious Services: More than 4 times per year    Active Member of [employer] or Organizations: Yes    Attends Music therapist: More than 4 times per year    Marital Status: Married     Review of Systems: A 12 point ROS discussed and pertinent positives are indicated in the HPI above.  All other systems are negative.  Review of Systems  Vital Signs: BP (!) 143/53   Pulse 63   Temp 98.1 F (36.7 C) (Oral)   Resp 15   Ht '5\' 7"'$  (1.702 m)   Wt 180 lb 1.9 oz (81.7 kg)   SpO2 93%   BMI 28.21 kg/m   Code status: Full code    Physical Exam Constitutional:      Appearance: Normal appearance.  HENT:     Head: Normocephalic and atraumatic.  Cardiovascular:     Rate and Rhythm: Normal rate.  Pulmonary:     Effort: Pulmonary effort is normal. No respiratory distress.  Abdominal:     Palpations: Abdomen is soft.     Tenderness: There is no abdominal tenderness.  Skin:    General: Skin is warm and dry.  Neurological:     General: No focal deficit present.  Mental Status: He is alert and oriented to  person, place, and time.  Psychiatric:        Mood and Affect: Mood normal.        Behavior: Behavior normal.        Thought Content: Thought content normal.        Judgment: Judgment normal.     Imaging: IR Fluoro Guide CV Line Right  Result Date: 10/26/2022 INDICATION: 88 year old male with history of acute on chronic kidney disease requiring hemodialysis. EXAM: NON-TUNNELED CENTRAL VENOUS HEMODIALYSIS CATHETER PLACEMENT WITH ULTRASOUND AND FLUOROSCOPIC GUIDANCE COMPARISON:  None Available. MEDICATIONS: None FLUOROSCOPY TIME:  Two mGy COMPLICATIONS: None immediate. PROCEDURE: Informed written consent was obtained from the patient after a discussion of the risks, benefits, and alternatives to treatment. Questions regarding the procedure were encouraged and answered. The right neck and chest were prepped with chlorhexidine in a sterile fashion, and a sterile drape was applied covering the operative field. Maximum barrier sterile technique with sterile gowns and gloves were used for the procedure. A timeout was performed prior to the initiation of the procedure. After the overlying soft tissues were anesthetized, a small venotomy incision was created and a micropuncture kit was utilized to access the internal jugular vein. Real-time ultrasound guidance was utilized for vascular access including the acquisition of a permanent ultrasound image documenting patency of the accessed vessel. The microwire was utilized to measure appropriate catheter length. A stiff glidewire was advanced to the level of the IVC. Under fluoroscopic guidance, the venotomy was serially dilated, ultimately allowing placement of a 20 cm temporary Trialysis catheter with tip ultimately terminating within the superior aspect of the right atrium. Final catheter positioning was confirmed and documented with a spot radiographic image. The catheter aspirates and flushes normally. The catheter was flushed with appropriate volume heparin  dwells. The catheter exit site was secured with a 0-Prolene retention suture. A dressing was placed. The patient tolerated the procedure well without immediate post procedural complication. IMPRESSION: Successful placement of a right internal jugular approach 20 cm temporary dialysis catheter with tip terminating with in the superior aspect of the right atrium. The catheter is ready for immediate use. PLAN: This catheter may be converted to a tunneled dialysis catheter at a later date as indicated. Ruthann Cancer, MD [facility] Radiology Electronically Signed   By: Ruthann Cancer M.D.   On: 10/26/2022 13:06   IR US Guide Vasc Access Right  Result Date: 10/26/2022 INDICATION: 88 year old male with history of acute on chronic kidney disease requiring hemodialysis. EXAM: NON-TUNNELED CENTRAL VENOUS HEMODIALYSIS CATHETER PLACEMENT WITH ULTRASOUND AND FLUOROSCOPIC GUIDANCE COMPARISON:  None Available. MEDICATIONS: None FLUOROSCOPY TIME:  Two mGy COMPLICATIONS: None immediate. PROCEDURE: Informed written consent was obtained from the patient after a discussion of the risks, benefits, and alternatives to treatment. Questions regarding the procedure were encouraged and answered. The right neck and chest were prepped with chlorhexidine in a sterile fashion, and a sterile drape was applied covering the operative field. Maximum barrier sterile technique with sterile gowns and gloves were used for the procedure. A timeout was performed prior to the initiation of the procedure. After the overlying soft tissues were anesthetized, a small venotomy incision was created and a micropuncture kit was utilized to access the internal jugular vein. Real-time ultrasound guidance was utilized for vascular access including the acquisition of a permanent ultrasound image documenting patency of the accessed vessel. The microwire was utilized to measure appropriate catheter length. A stiff  glidewire was advanced to the level of the IVC. Under fluoroscopic guidance, the venotomy was serially dilated, ultimately allowing placement of a 20 cm temporary Trialysis catheter with tip ultimately terminating within the superior aspect of the right atrium. Final catheter positioning was confirmed and documented with a spot radiographic image. The catheter aspirates and flushes normally. The catheter was flushed with appropriate volume heparin dwells. The catheter exit site was secured with a 0-Prolene retention suture. A dressing was placed. The patient tolerated the procedure well without immediate post procedural complication. IMPRESSION: Successful placement of a right internal jugular approach 20 cm temporary dialysis catheter with tip terminating with in the superior aspect of the right atrium. The catheter is ready for immediate use. PLAN: This catheter may be converted to a tunneled dialysis catheter at a later date as indicated. Ruthann Cancer, MD [facility] Radiology Electronically Signed   By: Ruthann Cancer M.D.   On: 10/26/2022 13:06   VAS [place] LOWER EXTREMITY VENOUS (DVT)  Result Date: 10/26/2022  Lower Venous DVT Study Patient Name:  Austin [name]  Date of Exam:   10/26/2022 Medical Rec #: [id number]   Accession #:    [id number] Date of Birth: 09-26-1934   Patient Gender: M Patient Age:   77 years Exam Location:  [facility] Procedure:      VAS [place] LOWER EXTREMITY VENOUS (DVT) Referring Phys: Murray Hodgkins --------------------------------------------------------------------------------  Indications: Elevated D-dimer in setting of Covid (1.65).  Limitations: Poor ultrasound/tissue interface. Comparison Study: Previous exam on 09/18/20 was negative for DVT Performing Technologist: Rogelia Rohrer RVT, RDMS  Examination Guidelines: A complete evaluation includes B-mode imaging, spectral Doppler, color Doppler, and power Doppler as needed of all accessible  portions of each vessel. Bilateral testing is considered an integral part of a complete examination. Limited examinations for reoccurring indications may be performed as noted. The reflux portion of the exam is performed with the patient in reverse Trendelenburg.  +---------+---------------+---------+-----------+----------+--------------+ RIGHT    CompressibilityPhasicitySpontaneityPropertiesThrombus Aging +---------+---------------+---------+-----------+----------+--------------+ CFV      Full           Yes      Yes                                 +---------+---------------+---------+-----------+----------+--------------+ SFJ      Full                                                        +---------+---------------+---------+-----------+----------+--------------+ FV Prox  Full           Yes      Yes                                 +---------+---------------+---------+-----------+----------+--------------+ FV Mid   Full           Yes      Yes                                 +---------+---------------+---------+-----------+----------+--------------+ FV DistalFull           Yes      Yes                                 +---------+---------------+---------+-----------+----------+--------------+  PFV      Full                                                        +---------+---------------+---------+-----------+----------+--------------+ POP      Full           Yes      Yes                                 +---------+---------------+---------+-----------+----------+--------------+ PTV      Full                                                        +---------+---------------+---------+-----------+----------+--------------+ PERO     Full                                                        +---------+---------------+---------+-----------+----------+--------------+   +---------+---------------+---------+-----------+----------+--------------+ LEFT      CompressibilityPhasicitySpontaneityPropertiesThrombus Aging +---------+---------------+---------+-----------+----------+--------------+ CFV      Full           Yes      Yes                                 +---------+---------------+---------+-----------+----------+--------------+ SFJ      Full                                                        +---------+---------------+---------+-----------+----------+--------------+ FV Prox  Full           Yes      Yes                                 +---------+---------------+---------+-----------+----------+--------------+ FV Mid   Full           Yes      Yes                                 +---------+---------------+---------+-----------+----------+--------------+ FV DistalFull           Yes      Yes                                 +---------+---------------+---------+-----------+----------+--------------+ PFV      Full                                                        +---------+---------------+---------+-----------+----------+--------------+  POP      Full           Yes      Yes                                 +---------+---------------+---------+-----------+----------+--------------+ PTV      Full                                                        +---------+---------------+---------+-----------+----------+--------------+ PERO     Full                                                        +---------+---------------+---------+-----------+----------+--------------+     Summary: BILATERAL: - No evidence of deep vein thrombosis seen in the lower extremities, bilaterally. - RIGHT: - No cystic structure found in the popliteal fossa.  LEFT: - A cystic structure is found in the popliteal fossa (4.63 x 1.06 x 3.32 cm).  *See table(s) above for measurements and observations. Electronically signed by Jamelle Haring on 10/26/2022 at 12:38:28 PM.    Final    US RENAL  Result Date: 10/25/2022 CLINICAL DATA:   Acute kidney injury EXAM: RENAL / URINARY TRACT ULTRASOUND COMPLETE COMPARISON:  None Available. FINDINGS: Right Kidney: Renal measurements: 10.0 x 7.0 x 4.6 cm = volume: 167.5 mL. Lower pole the right kidney not well visualized due to patient body habitus. Echogenicity within normal limits. Small simple appearing cyst of the mid region of the right kidney measuring to 4.2 cm and 1.5 cm. No mass or hydronephrosis visualized. Left Kidney: Renal measurements: 9.9 x 5.2 x 3.9 cm = volume: 103.5 mL. Lobulated contour. Echogenicity within normal limits. No mass or hydronephrosis visualized. Bladder: Appears normal for degree of bladder distention. Other: None. IMPRESSION: No hydronephrosis. Electronically Signed   By: Yetta Glassman M.D.   On: 10/25/2022 11:54   DG Chest Port 1 View  Result Date: 10/25/2022 CLINICAL DATA:  88 year old male with possible sepsis.  Fever. EXAM: PORTABLE CHEST 1 VIEW COMPARISON:  Chest x-ray 09/26/2021. FINDINGS: Lung volumes are normal. Widespread areas of interstitial prominence and peribronchial cuffing are noted, concerning for probable acute bronchitis. No definite confluent consolidative airspace disease. No pleural effusions. No pneumothorax. Pulmonary vasculature does not appear engorged. Heart size is mildly enlarged. Upper mediastinal contours are within normal limits allowing for patient rotation to the left. Atherosclerotic calcifications in the thoracic aorta. IMPRESSION: 1. The appearance the chest is concerning for an acute bronchitis. 2. Mild cardiomegaly. 3. Aortic atherosclerosis. Electronically Signed   By: Vinnie Langton M.D.   On: 10/25/2022 06:18   VAS [place] UPPER EXT VEIN MAPPING (PRE-OP AVF)  Result Date: 09/30/2022 UPPER EXTREMITY VEIN MAPPING Patient Name:  Austin [name]  Date of Exam:   09/29/2022 Medical Rec #: [id number]   Accession #:    [id number] Date of Birth: Jun 11, 1935   Patient Gender: M Patient Age:   59 years [facility]am [facility]  Procedure:      VAS [place] UPPER EXT VEIN MAPPING (PRE-OP AVF) Referring Phys: JOSHUA ROBINS --------------------------------------------------------------------------------  Indications: Pre-access. Comparison  Study: No prior study Performing Technologist: Maudry Mayhew MHA, RDMS, RVT, RDCS  Examination Guidelines: A complete evaluation includes B-mode imaging, spectral Doppler, color Doppler, and power Doppler as needed of all accessible portions of each vessel. Bilateral testing is considered an integral part of a complete examination. Limited examinations for reoccurring indications may be performed as noted. +-----------------+-------------+----------+---------+ Right Cephalic   Diameter (cm)Depth (cm)Findings  +-----------------+-------------+----------+---------+ Shoulder             0.18        0.94             +-----------------+-------------+----------+---------+ Prox upper arm       0.14        0.62             +-----------------+-------------+----------+---------+ Mid upper arm        0.15        0.40             +-----------------+-------------+----------+---------+ Dist upper arm       0.14        0.30             +-----------------+-------------+----------+---------+ Antecubital fossa    0.19        0.33             +-----------------+-------------+----------+---------+ Prox forearm         0.29        0.29   branching +-----------------+-------------+----------+---------+ Mid forearm          0.11        0.24             +-----------------+-------------+----------+---------+ Dist forearm         0.10        0.20             +-----------------+-------------+----------+---------+ +-----------------+-------------+----------+--------------+ Right Basilic    Diameter (cm)Depth (cm)   Findings    +-----------------+-------------+----------+--------------+ Mid upper arm        0.21        2.26                   +-----------------+-------------+----------+--------------+ Dist upper arm       0.21        2.10                  +-----------------+-------------+----------+--------------+ Antecubital fossa                       not visualized +-----------------+-------------+----------+--------------+ Prox forearm                            not visualized +-----------------+-------------+----------+--------------+ +-----------------+-------------+----------+--------+ Left Cephalic    Diameter (cm)Depth (cm)Findings +-----------------+-------------+----------+--------+ Shoulder             0.13        0.27            +-----------------+-------------+----------+--------+ Prox upper arm       0.14        0.32            +-----------------+-------------+----------+--------+ Mid upper arm        0.14        0.22            +-----------------+-------------+----------+--------+ Dist upper arm       0.18        0.36            +-----------------+-------------+----------+--------+ Antecubital fossa  0.17        0.32            +-----------------+-------------+----------+--------+ Prox forearm         0.19        0.27            +-----------------+-------------+----------+--------+ Mid forearm          0.13        0.24            +-----------------+-------------+----------+--------+ Dist forearm         0.11        0.15            +-----------------+-------------+----------+--------+ +-----------------+-------------+----------+--------------+ Left Basilic     Diameter (cm)Depth (cm)   Findings    +-----------------+-------------+----------+--------------+ Prox upper arm       0.78        0.97                  +-----------------+-------------+----------+--------------+ Mid upper arm        0.47        1.19     branching    +-----------------+-------------+----------+--------------+ Dist upper arm       0.25        0.64     branching     +-----------------+-------------+----------+--------------+ Antecubital fossa    0.22        0.82     branching    +-----------------+-------------+----------+--------------+ Prox forearm                            not visualized +-----------------+-------------+----------+--------------+ *See table(s) above for measurements and observations.  Diagnosing physician: Orlie Pollen Electronically signed by Orlie Pollen on 09/30/2022 at 9:20:58 AM.    Final    VAS [place] UPPER EXTREMITY ARTERIAL DUPLEX  Result Date: 09/30/2022  UPPER EXTREMITY DUPLEX STUDY Patient Name:  Austin [name]  Date of Exam:   09/29/2022 Medical Rec #: [id number]   Accession #:    [id number] Date of Birth: 04-Jul-1935   Patient Gender: M Patient Age:   62 years Exam Location:  [facility] Procedure:      VAS [place] UPPER EXTREMITY ARTERIAL DUPLEX Referring Phys: Vonna Kotyk ROBINS --------------------------------------------------------------------------------  Indications: Pre-surgical evaluation for HD access.  Risk Factors: Hypertension, Diabetes, past history of smoking. Comparison Study: No prior study Performing Technologist: Maudry Mayhew MHA, RDMS, RVT, RDCS  Examination Guidelines: A complete evaluation includes B-mode imaging, spectral Doppler, color Doppler, and power Doppler as needed of all accessible portions of each vessel. Bilateral testing is considered an integral part of a complete examination. Limited examinations for reoccurring indications may be performed as noted.  Right Pre-Dialysis Findings: +-----------------------+----------+--------------------+---------+--------+ Location               PSV (cm/s)Intralum. Diam. (cm)Waveform Comments +-----------------------+----------+--------------------+---------+--------+ Brachial Antecub. fossa83        0.62                biphasic          +-----------------------+----------+--------------------+---------+--------+ Radial Art at Wrist    85         0.31                biphasic          +-----------------------+----------+--------------------+---------+--------+ Ulnar Art at Wrist     100       0.28                triphasic         +-----------------------+----------+--------------------+---------+--------+  Left Pre-Dialysis Findings: +-----------------------+----------+--------------------+--------+--------+ Location               PSV (cm/s)Intralum. Diam. (cm)WaveformComments +-----------------------+----------+--------------------+--------+--------+ Brachial Antecub. fossa103       0.58                biphasic         +-----------------------+----------+--------------------+--------+--------+ Radial Art at Wrist    90        0.32                biphasic         +-----------------------+----------+--------------------+--------+--------+ Ulnar Art at Wrist     86        0.37                biphasic         +-----------------------+----------+--------------------+--------+--------+  Summary:  Right: No obstruction visualized in the right upper extremity. Left: No obstruction visualized in the left upper extremity. *See table(s) above for measurements and observations. Electronically signed by Orlie Pollen on 09/30/2022 at 9:20:48 AM.    Final     Labs:  CBC: Recent Labs    10/25/22 0555 10/25/22 0651 10/25/22 0927 10/26/22 0025 10/27/22 0500  WBC 7.4  --  5.4 2.7* 4.3  HGB 8.7* 8.5* 8.0* 7.3* 9.0*  HCT 25.6* 25.0* 24.9* 21.8* 28.2*  PLT 325  --  187 193 143*    COAGS: Recent Labs    10/25/22 0555  INR 1.2  APTT 35    BMP: Recent Labs    10/25/22 0645 10/25/22 0651 10/25/22 0927 10/26/22 0625  NA 142 141 145 141  K 3.7 6.5* 3.5 3.5  CL 110 112* 111 104  CO2 18*  --  13* 24  GLUCOSE 167* 159* 166* 173*  BUN 147* >130* 153* 100*  CALCIUM 8.6*  --  8.7* 8.4*  CREATININE 7.45* 8.90* 7.61*  7.71* 5.49*  GFRNONAA 7*  --  6*  6* 9*    LIVER FUNCTION TESTS: Recent Labs    10/25/22 0645   BILITOT 0.6  AST 26  ALT 6  ALKPHOS 42  PROT 6.5  ALBUMIN 2.9*    TUMOR MARKERS: No results for input(s): "AFPTM", "CEA", "CA199", "CHROMGRNA" in the last 8760 hours.  Assessment and Plan:  88 yo male with hx significant for DM II, HTN, CAD, GI bleed, CHF, OSA, stage V CKD and LUE AVF presents to IR for conversion of temp HD cath to tunneled HD cath placement.   Risks and benefits discussed with the patient including, but not limited to bleeding, infection, vascular injury, pneumothorax which may require chest tube placement, air embolism or even death  All of the patient's questions were answered, patient is agreeable to proceed. Consent signed and in chart.  Thank you for this interesting consult.  I greatly enjoyed meeting Austin [name] and look forward to participating in their care.  A copy of this report was sent to the requesting provider on this date.    Jeanne Diefendorf S Tanylah Schnoebelen PA-C 10/27/2022 1:27 PM      I spent a total of 20 Minutes  in face to face in clinical consultation, greater than 50% of which was counseling/coordinating care for temporary HD catheter to tunneled HD catheter.

## 2022-10-27 NOTE — Progress Notes (Signed)
Pharmacist Heart Failure Core Measure Documentation  Assessment: Austin [name] has an EF documented as 40-45% on 09/2021 by ECHO.  Rationale: Heart failure patients with left ventricular systolic dysfunction (LVSD) and an EF < 40% should be prescribed an angiotensin converting enzyme inhibitor (ACEI) or angiotensin receptor blocker (ARB) at discharge unless a contraindication is documented in the medical record.  This patient is not currently on an ACEI or ARB for HF.  This note is being placed in the record in order to provide documentation that a contraindication to the use of these agents is present for this encounter.  ACE Inhibitor or Angiotensin Receptor Blocker is contraindicated (specify all that apply)  '[]'$   ACEI allergy AND ARB allergy '[]'$   Angioedema '[]'$   Moderate or severe aortic stenosis '[]'$   Hyperkalemia '[]'$   Hypotension '[]'$   Renal artery stenosis '[x]'$   Worsening renal function, preexisting renal disease or dysfunction  Patient with CKD5 - on Isosorbide-Hydralazine alternatively.    [name] 10/27/2022 2:26 PM

## 2022-10-27 NOTE — Progress Notes (Signed)
Called to patient's room due to HD cath site bleeding.  Prior to entering room, I contacted nephrology, hospitalist and pharmacy to inform cath site was bleeding again, as I was told in report this issue had been ongoing. Upon arrival, the HD cath site was oozing blood, but during removal of previous dressing, the cath site began bleeding intensely. Rapid RN was called and pressure was held at site. Rapid RN at bedside along with nephrology. During rapid RNs assessment, they realized there was a slice in HD catheter, clamp was applied, bleeding successfully stopped. Nephrology on phone with IR for replacement of catheter, labs ordered. Will continue to monitor.

## 2022-10-27 NOTE — Progress Notes (Signed)
[facility] Progress Note   Assessment/ Plan:    AKI on Advanced CKD-->ESRD: - uremic on admission - renal US neg for obstruction - HD #1 10/25/22 - has tempcath, appreciate IR- will need to be converted to [facility] before d/c--. WBC ct 4.3, blood cultures NGTD x 2 days- will ask IR if can be coverted tomorrow - HD #2 10/27/22   2.  Acute encephalopathy:             - likely multifactorial- uremia, COVID etc             - improving  - rest per primary   3. COVID +             - got CAP coverage             - on dex  - dopplers negative for DVT  - on hep gtt   4.  Anemia:             - will do iron panel             - will likely need ESA-- ordered in HD   5.  BMM:             - PTH   6.  Dispo: admitted, CLIP in process  Subjective:    Seen in room.  Feeling good -sitting up and eating breakfast.  For HD today.  CLIP in process   Objective:   BP (!) 124/53 (BP Location: Right Arm)   Pulse (!) 56   Temp 99.4 F (37.4 C) (Oral)   Resp 17   Ht '5\' 7"'$  (1.702 m)   Wt 81.7 kg   SpO2 97%   BMI 28.21 kg/m   Physical Exam: GEN awake and alert- up in chair HEENT EOMI PERRL NECK NO JVD PULM clear anteriorly CV RRR no rubs, soft murmur ABD soft, nontender NABS EXT 2+ pedal edema NEURO awake and alert SKIN warm and dry ACCESS: R IJ tempcath  Labs: BMET Recent Labs  Lab 10/25/22 0645 10/25/22 0651 10/25/22 0927 10/26/22 0625  NA 142 141 145 141  K 3.7 6.5* 3.5 3.5  CL 110 112* 111 104  CO2 18*  --  13* 24  GLUCOSE 167* 159* 166* 173*  BUN 147* >130* 153* 100*  CREATININE 7.45* 8.90* 7.61*  7.71* 5.49*  CALCIUM 8.6*  --  8.7* 8.4*   CBC Recent Labs  Lab 10/25/22 0555 10/25/22 0651 10/25/22 0927 10/26/22 0025 10/27/22 0500  WBC 7.4  --  5.4 2.7* 4.3  NEUTROABS 6.8  --   --   --   --   HGB 8.7* 8.5* 8.0* 7.3* 9.0*  HCT 25.6* 25.0* 24.9* 21.8* 28.2*  MCV 82.3  --  85.3 80.1 83.4  PLT 325  --  187 193 143*      Medications:      amLODipine  10 mg Oral Daily   atorvastatin  20 mg Oral Daily   Chlorhexidine Gluconate Cloth  6 each Topical Q0600   cholecalciferol  1,000 Units Oral Daily   cloNIDine  0.3 mg Oral BID   clopidogrel  75 mg Oral Daily   darbepoetin (ARANESP) injection - DIALYSIS  100 mcg Subcutaneous Q Thu-1800   dexamethasone (DECADRON) injection  6 mg Intravenous Q24H   doxazosin  2 mg Oral QHS   folic acid  1 mg Oral Daily   insulin aspart  0-15 Units Subcutaneous TID WC   insulin aspart  0-5 Units Subcutaneous QHS   isosorbide-hydrALAZINE  1 tablet Oral TID   multivitamin with minerals  1 tablet Oral Daily   pantoprazole  40 mg Oral Daily   sodium chloride flush  3 mL Intravenous Q12H     Madelon Lips MD 10/27/2022, 10:29 AM

## 2022-10-27 NOTE — Progress Notes (Addendum)
Requested to see pt for out-pt HD needs at d/c. Unable to reach pt via phone since pt is covid positive. Spoke to pt's wife via phone. Introduced self and explained role. Discussed HD options. Pt's wife voices interest in TCU program (HD 4x's a week) for continued education on HD at d/c. Pt's wife informed navigator that pt/wife do not have a car and pt will need assistance with transportation to HD at d/c. Contacted TOC staff with pt's wife's request for resources/assistance with transportation to HD at d/c. Also inquired of TOC staff regarding pt's possible d/c plan. Should pt need rehab for any reason, then TCU will not be an option and will need to locate HD clinic that rehab will transport pt to at d/c. Advised by [facility] staff that therapy has not seen pt to provide any recs as this time. Referral submitted to [facility] admissions today for TCU. Can request regular in-center clinic if needed to accommodate pt's d/c plan. Will assist as needed.   Melven Sartorius Renal Navigator [phone]

## 2022-10-27 NOTE — Significant Event (Addendum)
Rapid Response Event Note   Reason for Call :  "[place]ushing blood from IJ HD catheter"  Told RN to apply pressure until my arrival without obstructing airway  Initial Focused Assessment:  Upon arrival patient A&O x4 in bed, VSS, with pressure held at insertion site. Large area of bright blood. Inspecting line--large cut blue lumen almost slicing tubing in two pieces. *See photo under media.  VS 150/57 (86) HR 68 RR 19 O2 93%   Interventions:  Pressure held x22mn Stop hep gtt Clamp applied to blue lumen below level of injury to stop blood flow MD E. Upton to bedside Stat Labs CBC/Renal NPO Contact IR for earlier procedure time  Bleeding is controlled after clamping line. VSS. Dressing replaced  Plan of Care:  Keep NPO, IR for tunneled HD catheter today   Event Summary:  MD Notified: ELaurena BeringMD, A. Hongalgi MD Call Time: 1ZebulonTime: 1216 End Time: 1[place] MNewman Nickels RN

## 2022-10-28 DIAGNOSIS — E785 Hyperlipidemia, unspecified: Secondary | ICD-10-CM | POA: Insufficient documentation

## 2022-10-28 DIAGNOSIS — N186 End stage renal disease: Secondary | ICD-10-CM | POA: Diagnosis not present

## 2022-10-28 DIAGNOSIS — Z992 Dependence on renal dialysis: Secondary | ICD-10-CM | POA: Diagnosis not present

## 2022-10-28 DIAGNOSIS — J9601 Acute respiratory failure with hypoxia: Secondary | ICD-10-CM | POA: Diagnosis not present

## 2022-10-28 DIAGNOSIS — E43 Unspecified severe protein-calorie malnutrition: Secondary | ICD-10-CM | POA: Insufficient documentation

## 2022-10-28 DIAGNOSIS — U071 COVID-19: Secondary | ICD-10-CM | POA: Diagnosis not present

## 2022-10-28 DIAGNOSIS — D631 Anemia in chronic kidney disease: Secondary | ICD-10-CM | POA: Insufficient documentation

## 2022-10-28 LAB — CBC
HCT: 22.7 % — ABNORMAL LOW (ref 39.0–52.0)
Hemoglobin: 7.9 g/dL — ABNORMAL LOW (ref 13.0–17.0)
MCH: 27.6 pg (ref 26.0–34.0)
MCHC: 34.8 g/dL (ref 30.0–36.0)
MCV: 79.4 fL — ABNORMAL LOW (ref 80.0–100.0)
Platelets: 180 10*3/uL (ref 150–400)
RBC: 2.86 MIL/uL — ABNORMAL LOW (ref 4.22–5.81)
RDW: 13.9 % (ref 11.5–15.5)
WBC: 6.6 10*3/uL (ref 4.0–10.5)
nRBC: 1.1 % — ABNORMAL HIGH (ref 0.0–0.2)

## 2022-10-28 LAB — D-DIMER, QUANTITATIVE: D-Dimer, Quant: 1.41 ug/mL-FEU — ABNORMAL HIGH (ref 0.00–0.50)

## 2022-10-28 LAB — GLUCOSE, CAPILLARY
Glucose-Capillary: 116 mg/dL — ABNORMAL HIGH (ref 70–99)
Glucose-Capillary: 174 mg/dL — ABNORMAL HIGH (ref 70–99)
Glucose-Capillary: 242 mg/dL — ABNORMAL HIGH (ref 70–99)
Glucose-Capillary: 213 mg/dL — ABNORMAL HIGH (ref 70–99)

## 2022-10-28 LAB — C-REACTIVE PROTEIN: CRP: 4.3 mg/dL — ABNORMAL HIGH (ref <1.0)

## 2022-10-28 MED ORDER — NEPRO/CARBSTEADY PO LIQD: 237.0000 mL | ORAL | Status: DC

## 2022-10-28 MED ORDER — POLYETHYLENE GLYCOL 3350 17 G PO PACK
17.0000 g | PACK | Freq: Every day | ORAL | Status: DC
  Administered 2022-10-28 – 2022-11-04 (×8): 17 g via ORAL
  Filled 2022-10-28 (×8): qty 1

## 2022-10-28 MED ORDER — NEPRO/CARBSTEADY PO LIQD
237.0000 mL | Freq: Two times a day (BID) | ORAL | Status: DC
  Administered 2022-10-28 – 2022-11-04 (×15): 237 mL via ORAL

## 2022-10-28 MED ORDER — INSULIN ASPART 100 UNIT/ML IJ SOLN
3.0000 [IU] | Freq: Three times a day (TID) | INTRAMUSCULAR | Status: DC
  Administered 2022-10-28 – 2022-11-04 (×18): 3 [IU] via SUBCUTANEOUS

## 2022-10-28 MED ORDER — SENNOSIDES-DOCUSATE SODIUM 8.6-50 MG PO TABS
1.0000 | ORAL_TABLET | Freq: Two times a day (BID) | ORAL | Status: DC
  Administered 2022-10-28 – 2022-11-04 (×16): 1 via ORAL
  Filled 2022-10-28 (×16): qty 1

## 2022-10-28 MED ORDER — BISACODYL 5 MG PO TBEC
5.0000 mg | DELAYED_RELEASE_TABLET | Freq: Every day | ORAL | Status: DC | PRN
  Administered 2022-10-30 – 2022-11-04 (×2): 5 mg via ORAL
  Filled 2022-10-28 (×2): qty 1

## 2022-10-28 MED ORDER — RENA-VITE PO TABS
1.0000 | ORAL_TABLET | Freq: Every day | ORAL | Status: DC
  Administered 2022-10-28 – 2022-11-04 (×8): 1 via ORAL
  Filled 2022-10-28 (×8): qty 1

## 2022-10-28 NOTE — Progress Notes (Addendum)
PROGRESS NOTE   Austin [name]  [id number]    DOB: 1935/05/30    DOA: 10/25/2022  PCP: Dorothyann Peng, NP   I have briefly reviewed patients previous medical records in [facility].  Chief Complaint  Patient presents with   Fever    Brief Narrative:  88 year old married male, independent, medical history significant for type II DM, HTN, CAD, prior GI bleed, CHF, sleep apnea, stage V CKD, recent placement of left upper extremity AV fistula on 10/18/2022, presented to the ED on 10/25/2022 with complaints of generalized weakness, not acting like himself, EMS noted fever of 102 F and oxygen saturation in the low 90s.  He was admitted for acute respiratory failure with hypoxia, COVID-19 acute bronchitis, acute kidney injury complicating stage V CKD with early uremia.  Nephrology was consulted, right temporary HD catheter (switched to [facility] on 3/7) was placed and started HD on 2/5.  Improving.   Assessment & Plan:  Principal Problem:   Acute respiratory failure with hypoxia (HCC) Active Problems:   Type 2 diabetes mellitus with hyperlipidemia (HCC)   Iron deficiency anemia due to chronic blood loss   Essential hypertension   GERD   Acute kidney injury superimposed on CKD ([place])   Pneumonia due to COVID-19 virus   CKD (chronic kidney disease) stage 5, GFR less than 15 ml/min (HCC)   Severe pulmonary hypertension (HCC)   Moderate aortic regurgitation   CAD in native artery   Protein-calorie malnutrition, severe   Acute kidney injury complicating stage V CKD, possibly new ESRD with volume overload/metabolic acidosis: S/p LUE AVG placed 2/27.  Nephrology consultation appreciated.  Now likely progressed to ESRD and early uremia.  Renal ultrasound without hydronephrosis.  Had right temporary HD catheter placed and initiated HD 2/5.  Due to bleeding complications from temporary HD catheter (slice in it), this was removed and TDC placed by IR 3/7.  Hyperkalemia and metabolic acidosis resolved.   Will need OP HD slot/clip.  Nephrology following.  COVID 19 acute bronchitis/acute respiratory failure with hypoxia: Required [place] oxygen initially but now has been weaned off to room air.  Hypoxia resolved.  Remains on IV Decadron 6 mg daily.  On admission, did not meet criteria for Actemra.  Inflammatory markers including D-dimer elevated.  D-dimer has improved.  D-dimer has gone up slightly compared to yesterday.  Was briefly on IV heparin infusion since admission until yesterday based on pharmacy recommendations given positive COVID, low oxygen needs and elevated D-dimer.  On 3/7, given that patient had low index of suspicion for PE to begin with, negative lower extremity venous Dopplers, decreasing D-dimer, bleeding from HD catheter site, and pharmacy recommendations, discontinued anticoagulation and changed to prophylactic dose.  Acute metabolic encephalopathy: Multifactorial due to ESRD with uremia, COVID-19.  Resolved.  Anemia due to ESRD: Hemoglobin has dropped from mid to low 8 g range to 7.3 in the absence of overt bleeding.  Nephrology checking iron stores and may need ESA.  Follow CBCs closely and transfuse if hemoglobin 7 g or less.  Hemoglobin checked yesterday afternoon during HD catheter site bleeding, 7.6 > 7.9 today, stable.  Type II DM with renal complications: [id number] 7.4.  Mildly uncontrolled and fluctuating.  Titrate insulins as needed, may need to increase, on steroids.  SSI was increased from very sensitive to moderate scale with improved control.  DM coordinator input appreciated and initiated NovoLog mealtime insulin 3 units.  Hypertension Continue amlodipine, clonidine, doxazosin, BiDil.  Volume management across HD.  Mildly  uncontrolled.  Dyslipidemia Continue statins.  Chronic combined CHF/severe pulmonary hypertension/moderate aortic regurgitation: Cardiac meds as above.  Volume management across HD.  Appears clinically euvolemic.  CAD No angina.  Continue clopidogrel  and statins.  Reported OSA Unclear if patient on CPAP prior to admission-will have to verify with him.  Prior history of GI bleeding Asymptomatic.  Constipation: No BM since hospital admission.  Passing flatus.  Tolerating diet well.  Initiated bowel regimen.  Mobilize.  PT consulted.  Body mass index is 28.21 kg/m.  Nutrition Status: Nutrition Problem: Moderate Malnutrition Etiology: poor appetite, chronic illness Signs/Symptoms: energy intake < or equal to 75% for > or equal to 1 month, mild fat depletion, moderate muscle depletion Interventions: MVI, Nepro shake, Liberalize Diet, Refer to RD note for recommendations, Education    DVT prophylaxis: heparin injection 5,000 Units Start: 10/27/22 1500  Added subcutaneous heparin   Code Status: Full Code:  ACP Documents: None present Family Communication: Spouse via phone. Disposition:  Status is: Inpatient Remains inpatient appropriate because: Will need need further inpatient dialysis and outpatient HD placement.  Transfer to medical bed.     Consultants:   Nephrology Interventional radiology  Procedures:   Placement of right temporary HD catheter Placement of right TDC by IR 3/7.  Antimicrobials:   Hemodialysis 3/5, 3/7   Subjective:  Minimal dry intermittent cough.  No dyspnea.  Mild pain at right [facility] site.  No further bleeding.  No BM since hospital admission.  Passing flatus.  Tolerating diet well.  Objective:   Vitals:   10/28/22 1126 10/28/22 1240 10/28/22 1413 10/28/22 1615  BP: (!) 134/50 (!) 138/54 (!) 127/50 (!) 132/52  Pulse: 67 63 68 64  Resp: '17 14 18 18  '$ Temp: 98.2 F (36.8 C)   98.8 F (37.1 C)  TempSrc: Oral   Oral  SpO2: 100% 100% 97% 95%  Weight:      Height:        General exam: Elderly male, moderately built and nourished, looks younger than stated age, lying comfortable propped up in bed without distress. Respiratory system: Clear to auscultation.  No increased work of breathing.   Right infraclavicular TDC site without acute findings.  Right base of neck prior temporary HD catheter site dressing clean and dry without bleeding. Cardiovascular system: S1 & S2 heard, RRR. No JVD, murmurs, rubs, gallops or clicks.  No pedal edema.  Telemetry personally reviewed: Sinus rhythm.  Discontinued telemetry 3/8 Gastrointestinal system: Abdomen is nondistended, soft and nontender. No organomegaly or masses felt. Normal bowel sounds heard. Central nervous system: Alert and oriented. No focal neurological deficits. Extremities: Symmetric 5 x 5 power.  Left upper arm AVF thrill well-appreciated, site appears healed. Skin: No rashes, lesions or ulcers Psychiatry: Judgement and insight appear normal. Mood & affect appropriate.     Data Reviewed:   I have personally reviewed following labs and imaging studies   CBC: Recent Labs  Lab 10/25/22 0555 10/25/22 0651 10/27/22 0500 10/27/22 1241 10/28/22 0039  WBC 7.4   < > 4.3 5.9 6.6  NEUTROABS 6.8  --   --   --   --   HGB 8.7*   < > 9.0* 7.6* 7.9*  HCT 25.6*   < > 28.2* 23.7* 22.7*  MCV 82.3   < > 83.4 84.3 79.4*  PLT 325   < > 143* 172 180   < > = values in this interval not displayed.    Basic Metabolic Panel: Recent Labs  Lab  10/25/22 0645 10/25/22 0651 10/25/22 0927 10/26/22 0625 10/27/22 1241  NA 142 141 145 141 132*  K 3.7 6.5* 3.5 3.5 4.0  CL 110 112* 111 104 97*  CO2 18*  --  13* 24 21*  GLUCOSE 167* 159* 166* 173* 268*  BUN 147* >130* 153* 100* 120*  CREATININE 7.45* 8.90* 7.61*  7.71* 5.49* 6.75*  CALCIUM 8.6*  --  8.7* 8.4* 8.2*  PHOS  --   --   --   --  7.2*    Liver Function Tests: Recent Labs  Lab 10/25/22 0645 10/27/22 1241  AST 26  --   ALT 6  --   ALKPHOS 42  --   BILITOT 0.6  --   PROT 6.5  --   ALBUMIN 2.9* 2.5*    CBG: Recent Labs  Lab 10/28/22 0547 10/28/22 1120 10/28/22 1610  GLUCAP 116* 174* 242*    Microbiology Studies:   Recent Results (from the past 240 hour(s))   Resp panel by RT-PCR (RSV, Flu A&B, Covid) Anterior Nasal Swab     Status: Abnormal   Collection Time: 10/25/22  5:46 AM   Specimen: Anterior Nasal Swab  Result Value Ref Range Status   SARS Coronavirus 2 by RT PCR POSITIVE (A) NEGATIVE Final   Influenza A by PCR NEGATIVE NEGATIVE Final   Influenza B by PCR NEGATIVE NEGATIVE Final    Comment: (NOTE) The Xpert Xpress SARS-CoV-2/FLU/RSV plus assay is intended as an aid in the diagnosis of influenza from Nasopharyngeal swab specimens and should not be used as a sole basis for treatment. Nasal washings and aspirates are unacceptable for Xpert Xpress SARS-CoV-2/FLU/RSV testing.  Fact Sheet for Patients: EntrepreneurPulse.com.au  Fact Sheet for Healthcare Providers: IncredibleEmployment.be  This test is not yet approved or cleared by the [place] FDA and has been authorized for detection and/or diagnosis of SARS-CoV-2 by FDA under an Emergency Use Authorization (EUA). This EUA will remain in effect (meaning this test can be used) for the duration of the COVID-19 declaration under Section 564(b)(1) of the Act, 21 U.S.C. section 360bbb-3(b)(1), unless the authorization is terminated or revoked.     Resp Syncytial Virus by PCR NEGATIVE NEGATIVE Final    Comment: (NOTE) Fact Sheet for Patients: EntrepreneurPulse.com.au  Fact Sheet for Healthcare Providers: IncredibleEmployment.be  This test is not yet approved or cleared by the [place] FDA and has been authorized for detection and/or diagnosis of SARS-CoV-2 by FDA under an Emergency Use Authorization (EUA). This EUA will remain in effect (meaning this test can be used) for the duration of the COVID-19 declaration under Section 564(b)(1) of the Act, 21 U.S.C. section 360bbb-3(b)(1), unless the authorization is terminated or revoked.  Performed at [facility], [place] [address], [place],  [place] [id number]   Blood Culture (routine x 2)     Status: None (Preliminary result)   Collection Time: 10/25/22  6:10 AM   Specimen: BLOOD RIGHT HAND  Result Value Ref Range Status   Specimen Description BLOOD RIGHT HAND  Final   Special Requests   Final    BOTTLES DRAWN AEROBIC AND ANAEROBIC Blood Culture adequate volume   Culture   Final    NO GROWTH 3 DAYS Performed at [facility], [place] [address], [place], [place] [address]    Report Status PENDING  Incomplete  Blood Culture (routine x 2)     Status: None (Preliminary result)   Collection Time: 10/25/22  6:15 AM   Specimen: BLOOD  RIGHT HAND  Result Value Ref Range Status   Specimen Description BLOOD RIGHT HAND  Final   Special Requests   Final    BOTTLES DRAWN AEROBIC AND ANAEROBIC Blood Culture results may not be optimal due to an inadequate volume of blood received in culture bottles   Culture   Final    NO GROWTH 3 DAYS Performed at [facility], [place] [address], [place], [place] [address]    Report Status PENDING  Incomplete    Radiology Studies:  IR Fluoro Guide CV Line Right  Result Date: 10/27/2022 INDICATION: 88 year old male with history of acute on chronic kidney disease requiring hemodialysis status post temporary hemodialysis catheter placement on 10/25/2022, presenting with a hole in the external portion of the indwelling catheter. EXAM: TUNNELED CENTRAL VENOUS HEMODIALYSIS CATHETER PLACEMENT WITH FLUOROSCOPIC GUIDANCE MEDICATIONS: Ancef 2 gm IV . The antibiotic was given in an appropriate time interval prior to skin puncture. ANESTHESIA/SEDATION: Moderate (conscious) sedation was employed during this procedure. A total of Versed 1.5 mg and Fentanyl 0 mcg was administered intravenously. Moderate Sedation Time: 0 minutes. The patient's level of consciousness and vital signs were monitored continuously by radiology nursing throughout the procedure under my direct supervision. FLUOROSCOPY TIME:  One mGy COMPLICATIONS:  None immediate. PROCEDURE: Informed written consent was obtained from the patient after a discussion of the risks, benefits, and alternatives to treatment. Questions regarding the procedure were encouraged and answered. The right neck and chest were prepped with chlorhexidine in a sterile fashion, and a sterile drape was applied covering the operative field. Maximum barrier sterile technique with sterile gowns and gloves were used for the procedure. A timeout was performed prior to the initiation of the procedure. Three indwelling right IJ catheter, a Rosen wire was advanced to the level of the IVC. A 14.5 French tunneled hemodialysis catheter measuring 23 cm from tip to cuff was tunneled in a retrograde fashion from the anterior chest wall to the established venotomy incision. The indwelling catheter was removed over the wire and a peel-away sheath was placed. The catheter was then placed through the peel-away sheath with the catheter tip ultimately positioned within the right atrium. Final catheter positioning was confirmed and documented with a spot radiographic image. The catheter aspirates and flushes normally. The catheter was flushed with appropriate volume heparin dwells. The catheter exit site was secured with a 0-Silk retention suture. The venotomy incision was closed with Dermabond. Sterile dressings were applied. The patient tolerated the procedure well without immediate post procedural complication. IMPRESSION: Successful placement of 23 cm tip to cuff tunneled hemodialysis catheter via the right internal jugular vein with catheter tip terminating within the right atrium. The catheter is ready for immediate use. Ruthann Cancer, MD [facility] Rad[facility] Electronically Signed   By: Ruthann Cancer M.D.   On: 10/27/2022 14:59    Scheduled Meds:    amLODipine  10 mg Oral Daily   atorvastatin  20 mg Oral Daily   Chlorhexidine Gluconate Cloth  6 each Topical  Q0600   cholecalciferol  1,000 Units Oral Daily   cloNIDine  0.3 mg Oral BID   clopidogrel  75 mg Oral Daily   darbepoetin (ARANESP) injection - DIALYSIS  100 mcg Subcutaneous Q Thu-1800   dexamethasone (DECADRON) injection  6 mg Intravenous Q24H   doxazosin  2 mg Oral QHS   feeding supplement (NEPRO CARB STEADY)  237 mL Oral BID BM   folic acid  1 mg Oral Daily  heparin injection (subcutaneous)  5,000 Units Subcutaneous Q8H   insulin aspart  0-15 Units Subcutaneous TID WC   insulin aspart  0-5 Units Subcutaneous QHS   insulin aspart  3 Units Subcutaneous TID WC   isosorbide-hydrALAZINE  1 tablet Oral TID   multivitamin  1 tablet Oral QHS   pantoprazole  40 mg Oral Daily   polyethylene glycol  17 g Oral Daily   senna-docusate  1 tablet Oral BID   sodium chloride flush  3 mL Intravenous Q12H    Continuous Infusions:       LOS: 3 days     Vernell Leep, MD,  FACP, FHM, SFHM, [facility], [place]     To contact the attending provider between 7A-7P or the covering provider during after hours 7P-7A, please log into the web site [email or web address] and access using universal [facility] password for that web site. If you do not have the password, please call the hospital operator.  10/28/2022, 4:46 PM

## 2022-10-28 NOTE — Progress Notes (Signed)
[facility] Progress Note   Assessment/ Plan:    AKI on Advanced CKD-->ESRD: - uremic on admission - renal US neg for obstruction - HD #1 10/25/22 - had tempcath- had a slice in the catheter- not a pinhole- see images under media tab- IR quickly placed [facility]- greatly appreciate assistance - HD #2 10/27/22 - HD #3 10/29/22   2.  Acute encephalopathy:             - likely multifactorial- uremia, COVID etc             - improving  - rest per primary   3. COVID +             - got CAP coverage             - on dex  - dopplers negative for DVT  - on hep gtt   4.  Anemia:             - will do iron panel             - will likely need ESA-- ordered in HD   5.  BMM:             - PTH   6.  Dispo: admitted, CLIP in process  Subjective:    Seen in room.  Feeling better,  Eating breakfast.  For HD #3 tomorrow   Objective:   BP (!) 134/50 (BP Location: Right Arm)   Pulse 67   Temp 98.2 F (36.8 C) (Oral)   Resp 17   Ht '5\' 7"'$  (1.702 m)   Wt 81.7 kg   SpO2 100%   BMI 28.21 kg/m   Physical Exam: GEN awake and alert- up in chair HEENT EOMI PERRL NECK NO JVD PULM clear anteriorly CV RRR no rubs, soft murmur ABD soft, nontender NABS EXT 2+ pedal edema NEURO awake and alert SKIN warm and dry ACCESS: R IJ tempcath  Labs: BMET Recent Labs  Lab 10/25/22 0645 10/25/22 0651 10/25/22 0927 10/26/22 0625 10/27/22 1241  NA 142 141 145 141 132*  K 3.7 6.5* 3.5 3.5 4.0  CL 110 112* 111 104 97*  CO2 18*  --  13* 24 21*  GLUCOSE 167* 159* 166* 173* 268*  BUN 147* >130* 153* 100* 120*  CREATININE 7.45* 8.90* 7.61*  7.71* 5.49* 6.75*  CALCIUM 8.6*  --  8.7* 8.4* 8.2*  PHOS  --   --   --   --  7.2*   CBC Recent Labs  Lab 10/25/22 0555 10/25/22 0651 10/26/22 0025 10/27/22 0500 10/27/22 1241 10/28/22 0039  WBC 7.4   < > 2.7* 4.3 5.9 6.6  NEUTROABS 6.8  --   --   --   --   --   HGB 8.7*   < > 7.3* 9.0* 7.6* 7.9*  HCT 25.6*   < > 21.8* 28.2* 23.7* 22.7*   MCV 82.3   < > 80.1 83.4 84.3 79.4*  PLT 325   < > 193 143* 172 180   < > = values in this interval not displayed.      Medications:     amLODipine  10 mg Oral Daily   atorvastatin  20 mg Oral Daily   Chlorhexidine Gluconate Cloth  6 each Topical Q0600   cholecalciferol  1,000 Units Oral Daily   cloNIDine  0.3 mg Oral BID   clopidogrel  75 mg Oral Daily   darbepoetin (ARANESP) injection - DIALYSIS  100 mcg Subcutaneous Q Thu-1800   dexamethasone (DECADRON) injection  6 mg Intravenous Q24H   doxazosin  2 mg Oral QHS   folic acid  1 mg Oral Daily   heparin injection (subcutaneous)  5,000 Units Subcutaneous Q8H   insulin aspart  0-15 Units Subcutaneous TID WC   insulin aspart  0-5 Units Subcutaneous QHS   insulin aspart  3 Units Subcutaneous TID WC   isosorbide-hydrALAZINE  1 tablet Oral TID   multivitamin with minerals  1 tablet Oral Daily   pantoprazole  40 mg Oral Daily   polyethylene glycol  17 g Oral Daily   senna-docusate  1 tablet Oral BID   sodium chloride flush  3 mL Intravenous Q12H   Thrombi-Pad  1 each Topical STAT     Madelon Lips MD 10/28/2022, 12:09 PM

## 2022-10-28 NOTE — Inpatient Diabetes Management (Signed)
Inpatient Diabetes Program Recommendations  AACE/ADA: New Consensus Statement on Inpatient Glycemic Control (2015)  Target Ranges:  Prepandial:   less than 140 mg/dL      Peak postprandial:   less than 180 mg/dL (1-2 hours)      Critically ill patients:  140 - 180 mg/dL    Latest Reference Range & Units 10/27/22 06:26 10/27/22 11:50 10/27/22 14:53 10/27/22 16:35 10/27/22 21:34  Glucose-Capillary 70 - 99 mg/dL 146 (H)  2 units Novolog  213 (H) 276 (H)  8 units Novolog  218 (H)  5 units Novolog  132 (H)  (H): Data is abnormally high  Latest Reference Range & Units 10/28/22 05:47  Glucose-Capillary 70 - 99 mg/dL 116 (H)  (H): Data is abnormally high   Admit with: Acute hypoxic respiratory failure, fever, COVID-pneumonia   History: DM, CKD5 (impending Dialysis)   Home DM Meds: None listed   Current Orders: Novolog 0-15 units TID AC + HS         MD- Note pt getting Decadron 6 mg Q24H    Per Flowsheet, pt ate 90% of at least 1 meal yeesterday  Please consider adding Novolog Meal Coverage:   Novolog 3 units TID with meals HOLD if pt NPO HOLD if pt eats <50% meals    --Will follow patient during hospitalization--  Wyn Quaker RN, MSN, [place] Diabetes Coordinator Inpatient Glycemic Control Team Team Pager: [phone] (8a-5p)

## 2022-10-28 NOTE — Progress Notes (Signed)
Pt has been tentatively accepted at [facility] on MWF 12:40 chair time. Final schedule/acceptance pending pt's final d/c plan and d/c date. PT eval pending. Should pt require snf at d/c, will need to make sure that snf would be able to accommodate clinic and schedule. If pt returns to home with wife, clinic states they will likely be able to start pt on Wednesday, March 13. Can confirm details with clinic on Monday. Spoke to pt's wife via phone to go over tentative schedule at [facility] MWF 12:40 chair time and will finalize start date once d/c disposition is confirmed and d/c date known. Will update nephrologist. Will assist as needed.   Melven Sartorius Renal Navigator [phone]

## 2022-10-28 NOTE — Evaluation (Signed)
Physical Therapy Evaluation Patient Details Name: Austin [name] MRN: [id number] DOB: 1934-11-04 Today's Date: 10/28/2022  History of Present Illness  Pt is 88 year old presented to [facility] on 10/25/22 with acute hypoxic respiratory failure due to acute Covid. Pt with continued progression of ckd and temporary HD cath placed and HD intitiated. PMH: ckd with impending HD, diastolic CHF, DM2 HTN, HLD, prostate CA hx  Clinical Impression  Pt admitted with above diagnosis and presents to PT with functional limitations due to deficits listed below (See PT problem list). Pt needs skilled PT to maximize independence and safety to allow discharge to SNF for further rehab prior to return home. Pt typically able to mobilize independently without assistive device including managing a flight of stairs at home to get to his bedroom/bathroom. Do not feel pt will be at a level to do that when ready for dc from acute care. Pt also with new HD which means he will have to get to outpt HD 3x/wk at dc which will contribute to pt's fatigue. Expect pt will make slow, steady progress but will need time at SNF to progress to home.         Recommendations for follow up therapy are one component of a multi-disciplinary discharge planning process, led by the attending physician.  Recommendations may be updated based on patient status, additional functional criteria and insurance authorization.  Follow Up Recommendations Skilled nursing-short term rehab (<3 hours/day) Can patient physically be transported by private vehicle: Yes    Assistance Recommended at Discharge Frequent or constant Supervision/Assistance  Patient can return home with the following  A little help with walking and/or transfers;A little help with bathing/dressing/bathroom;Assistance with cooking/housework;Help with stairs or ramp for entrance;Assist for transportation    Equipment Recommendations Wheelchair cushion (measurements PT);Wheelchair (measurements PT)   Recommendations for Other Services       Functional Status Assessment Patient has had a recent decline in their functional status and demonstrates the ability to make significant improvements in function in a reasonable and predictable amount of time.     Precautions / Restrictions Precautions Precautions: Fall Restrictions Weight Bearing Restrictions: No      Mobility  Bed Mobility Overal bed mobility: Needs Assistance Bed Mobility: Supine to Sit     Supine to sit: Min assist     General bed mobility comments: Assist to elevate trunk into sitting    Transfers Overall transfer level: Needs assistance Equipment used: Rolling walker (2 wheels) Transfers: Sit to/from Stand Sit to Stand: Min assist           General transfer comment: Assist to power up and to stabilize. Verbal cues for hand placement    Ambulation/Gait Ambulation/Gait assistance: Min assist Gait Distance (Feet): 20 Feet Assistive device: Rolling walker (2 wheels) Gait Pattern/deviations: Step-through pattern, Decreased step length - right, Decreased step length - left, Decreased stride length Gait velocity: decr Gait velocity interpretation: <1.31 ft/sec, indicative of household ambulator   General Gait Details: Assist for support and stability.  Stairs            Wheelchair Mobility    Modified Rankin (Stroke Patients Only)       Balance Overall balance assessment: Needs assistance Sitting-balance support: No upper extremity supported, Feet supported Sitting balance-Leahy Scale: Good     Standing balance support: Bilateral upper extremity supported, During functional activity Standing balance-Leahy Scale: Poor Standing balance comment: walker and min guard for static standing  Pertinent Vitals/Pain Pain Assessment Pain Assessment: No/denies pain    Home Living Family/patient expects to be discharged to:: Private residence Living  Arrangements: Spouse/significant other Available Help at Discharge: Family;Available 24 hours/day Type of Home: House Home Access: Stairs to enter Entrance Stairs-Rails: Left Entrance Stairs-Number of Steps: 3-4 Alternate Level Stairs-Number of Steps: flight Home Layout: Multi-level;Bed/bath upstairs;1/2 bath on main level Home Equipment: Rollator (4 wheels);BSC/3in1      Prior Function Prior Level of Function : Independent/Modified Independent             Mobility Comments: Not using assistive device       Hand Dominance   Dominant Hand: Right    Extremity/Trunk Assessment   Upper Extremity Assessment Upper Extremity Assessment: Defer to OT evaluation    Lower Extremity Assessment Lower Extremity Assessment: Generalized weakness       Communication   Communication: No difficulties  Cognition Arousal/Alertness: Awake/alert Behavior During Therapy: Flat affect Overall Cognitive Status: Within Functional Limits for tasks assessed                                          General Comments General comments (skin integrity, edema, etc.): Pt on 1L O2 at rest on entry with SpO2 97%. Removed O2 and pt remained >92% on RA throughout session. Left O2 off and nurse aware    Exercises     Assessment/Plan    PT Assessment Patient needs continued PT services  PT Problem List Decreased strength;Decreased activity tolerance;Decreased balance;Decreased mobility;Decreased knowledge of use of DME       PT Treatment Interventions DME instruction;Gait training;Stair training;Functional mobility training;Therapeutic activities;Therapeutic exercise;Balance training;Patient/family education    PT Goals (Current goals can be found in the Care Plan section)  Acute Rehab PT Goals Patient Stated Goal: return home PT Goal Formulation: With patient Time For Goal Achievement: 11/11/22 Potential to Achieve Goals: Good    Frequency Min 3X/week     Co-evaluation                AM-PAC PT "6 Clicks" Mobility  Outcome Measure Help needed turning from your back to your side while in a flat bed without using bedrails?: A Little Help needed moving from lying on your back to sitting on the side of a flat bed without using bedrails?: A Little Help needed moving to and from a bed to a chair (including a wheelchair)?: A Little Help needed standing up from a chair using your arms (e.g., wheelchair or bedside chair)?: A Little Help needed to walk in hospital room?: A Little Help needed climbing 3-5 steps with a railing? : A Lot 6 Click Score: 17    End of Session Equipment Utilized During Treatment: Gait belt Activity Tolerance: Patient limited by fatigue Patient left: in chair;with call bell/phone within reach;with chair alarm set;with family/visitor present Nurse Communication: Mobility status;Other (comment) (Left O2 off) PT Visit Diagnosis: Other abnormalities of gait and mobility (R26.89);Muscle weakness (generalized) (M62.81)    Time: 1539-1610 PT Time Calculation (min) (ACUTE ONLY): 31 min   Charges:   PT Evaluation $PT Eval Moderate Complexity: 1 Mod PT Treatments $Gait Training: 8-22 mins        [place] [facility] [place] 10/28/2022, 4:35 PM

## 2022-10-28 NOTE — Progress Notes (Signed)
bedside Alert and oriented.  Informed consent signed and in chart.   TX duration 3 hours  Patient tolerated well.  Alert, without acute distress.  Hand-off given to patient's nurse.   Access used: catheter Access issues: none  Total UF removed: 1000 mls Medication(s) given: none.    10/27/22 2326  Vitals  Temp 99.9 F (37.7 C)  Temp Source Oral  BP (!) 163/58  MAP (mmHg) 89  BP Location Right Arm  BP Method Automatic  Patient Position (if appropriate) Lying  Pulse Rate 80  Pulse Rate Source Monitor  ECG Heart Rate 80  Resp 17  Oxygen Therapy  SpO2 91 %  O2 Device Room Air  Patient Activity (if Appropriate) In bed  Pulse Oximetry Type Continuous  Post Treatment  Dialyzer Clearance Lightly streaked  Duration of HD Treatment -hour(s) 3 hour(s)  Hemodialysis Intake (mL) 0 mL  Liters Processed 54  Fluid Removed (mL) 1000 mL  Tolerated HD Treatment Yes  Post-Hemodialysis Comments HD tx completed as expected, uf goal reached, tolerated well.  pt is stable.  AVG/AVF Arterial Site Held (minutes) 0 minutes  AVG/AVF Venous Site Held (minutes) 0 minutes  Note  Observations pt is bed resting, alert, oriented, verbally responsive, no complaints.  Hemodialysis Catheter Right Internal jugular Double lumen Permanent (Tunneled)  Placement Date/Time: 10/27/22 1407   Serial / Lot #: [id number]  Expiration Date: 09/01/26  Time Out: Correct patient;Correct site;Correct procedure  Maximum sterile barrier precautions: Hand hygiene;Cap;Mask;Sterile gown;Sterile gloves;Large sterile ...  Site Condition No complications  Blue Lumen Status Infusing  Red Lumen Status Infusing  Purple Lumen Status N/A  Catheter fill solution Heparin 1000 units/ml  Catheter fill volume (Arterial) 1.9 cc  Catheter fill volume (Venous) 1.9  Dressing Type Gauze/Drain sponge  Dressing Status Antimicrobial disc in place  Drainage Description None  Dressing Change Due 11/03/22  Post treatment catheter status  Capped and Clamped

## 2022-10-28 NOTE — Progress Notes (Signed)
Initial Nutrition Assessment  DOCUMENTATION CODES:   Severe malnutrition in context of chronic illness  INTERVENTION:  Change MVI to renal MVI  Liberalize diet to regular diet, no salt packet, 1200 ml fluid restriction Nepro Shake po BID, each supplement provides 425 kcal and 19 grams protein Initiated education on appropriate nutrition for HD  Educated patient on adequate nutrition  Provided handouts on nutrition for HD patients  NUTRITION DIAGNOSIS:   Moderate Malnutrition related to poor appetite, chronic illness as evidenced by energy intake < or equal to 75% for > or equal to 1 month, mild fat depletion, moderate muscle depletion.   GOAL:   Patient will meet greater than or equal to 90% of their needs   MONITOR:   PO intake, Weight trends, Supplement acceptance, I & O's, Labs  REASON FOR ASSESSMENT:   Consult Diet education  ASSESSMENT:   88 yo male presnets with generalized weakness and admitted with acute hypoxic respiratory failure, fever, Covid-pneumonia, AKI on CKD 5 requiring initiation of HD.  PMHx CHF, chronic constipation, DM2, CAD, CKD 5 s/p AV fistula 2 weeks ago, prior GI bleed Labs: Na 132, Glu 268, phos 7.2 Wt hx: stable wt x 1 yr Meds: vitamin D3, dexamethasone,.folvite, insulin, miralax, senokot, MVI with minerals  Visited patient at bedside whose wife was present. Patient reports fair appetite and some taste changes for the past 2 weeks. He denies losing his sense of smell. RD observed he had eaten bites of his lunch tray. Patient and his wife report him to have a good appetite at baseline and he normally eats 2 large meals per day (B: eggs, sausage/bacon, grits, biscuit/toast; D: varies). Patient reports he snacks on roasted nuts between meals. Patient has recently started HD. RD encouraged him to consult with his renal RD at clinic when he discharges. Patient reports UBW to be 180# which is consistent with his CBW however, patient has non pitting edema  which is potentially masking his weight loss. Patient was beginning to nod off as education was continued. Handouts were given to his wife. Patient and his wife are agreeable with nutrition plans in place.      NUTRITION - FOCUSED PHYSICAL EXAM:  Flowsheet Row Most Recent Value  Orbital Region No depletion  Upper Arm Region Moderate depletion  Thoracic and Lumbar Region No depletion  Buccal Region No depletion  Temple Region Moderate depletion  Clavicle Bone Region Mild depletion  Clavicle and Acromion Bone Region Moderate depletion  Scapular Bone Region Moderate depletion  Dorsal Hand No depletion  Patellar Region Moderate depletion  Anterior Thigh Region Moderate depletion  Posterior Calf Region Moderate depletion  Edema (RD Assessment) Mild  Hair Reviewed  Eyes Reviewed  [milky]  Mouth Reviewed  Skin Reviewed  Nails Reviewed       Diet Order:   Diet Order             Diet regular Room service appropriate? Yes; Fluid consistency: Thin; Fluid restriction: 1200 mL Fluid  Diet effective now                   EDUCATION NEEDS:   Education needs have been addressed  Skin:  Skin Assessment: Reviewed RN Assessment  Last BM:  3/5  Height:   Ht Readings from Last 1 Encounters:  10/25/22 '5\' 7"'$  (1.702 m)    Weight:   Wt Readings from Last 1 Encounters:  10/27/22 81.7 kg     BMI:  Body mass index is 28.21 kg/m.  Estimated Nutritional Needs:   Kcal:  1700-2000 kcal/day  Protein:  85-100 g protein/day  Fluid:  </= 1200 ml    Trey Paula, RDN, LDN  Clinical Nutrition

## 2022-10-28 NOTE — TOC Progression Note (Signed)
Transition of Care [facility]) - Progression Note    Patient Details  Name: Austin [name] MRN: [id number] Date of Birth: September 28, 1934  Transition of Care [facility]) CM/SW [place], RN Phone Number: 10/28/2022, 2:50 PM  Clinical Narrative:    CM met with the patient and wife at the bedside to discuss TOC needs after patient was transferred to 2 [facility] unit.  The patient was admitted for COVID and is a new HD patient.  He lives at home with his wife and is pending evaluation for mobility at this time.  DME at the home includes a BSC and RW.  The patient states that she is able to drive but she explained to renal navigator that they currently do not have a car and will need assistance with transportation for HD sessions as an Outpatient if patient is able to return home for care.  PT eval is currently pending.  Transportation - I called [employer] and patient has available transportation assistance through [place] for 12 rides - at $0 cost to the patient - through [facility] - [phone].  This information will be provided to the wife.  [employer] application will be provided to the wife for alternative transportation needs as well.  I spoke with Melven Sartorius, renal navigator is she is aware of patient's pending PT eval and need for transportation as well.  CM will continue to follow the patient for discharge planning needed - SNF versus [facility] services.   Expected Discharge Plan: [facility] versus SNF placement - pending PT/OT eval) Barriers to Discharge: Continued Medical Work up  Expected Discharge Plan and Services   Discharge Planning Services: CM Consult   Living arrangements for the past 2 months: Single Family Home                                       Social Determinants of Health (SDOH) Interventions SDOH Screenings   Food Insecurity: No Food Insecurity (07/22/2022)  Housing: Low Risk  (07/22/2022)  Transportation Needs: No Transportation  Needs (07/27/2022)  Utilities: Not At Risk (07/22/2022)  Alcohol Screen: Low Risk  (02/10/2022)  Depression (PHQ2-9): Low Risk  (07/22/2022)  Financial Resource Strain: Low Risk  (02/10/2022)  Physical Activity: Insufficiently Active (02/10/2022)  Social Connections: Socially Integrated (02/10/2022)  Stress: No Stress Concern Present (02/10/2022)  Tobacco Use: Medium Risk (10/27/2022)    Readmission Risk Interventions    10/28/2022    2:49 PM 09/30/2021   11:17 AM 07/13/2021   12:43 PM  Readmission Risk Prevention Plan  Transportation Screening Complete Complete Complete  PCP or Specialist Appt within 5-7 Days Complete    PCP or Specialist Appt within 3-5 Days  Complete Complete  Home Care Screening Complete    Medication Review (RN CM) Complete    HRI or Home Care Consult  Complete Complete  Social Work Consult for [place] Planning/Counseling  Complete Complete  Palliative Care Screening  Not Applicable Not Applicable  Medication Review Press photographer)  Complete Complete

## 2022-10-28 NOTE — Progress Notes (Signed)
Patient complained difficult breathing around  12.35 PM and assessed for vitals.  RR 14, SPO2 100 on RA. HOB is already elevated. Lungs are clear and diminished. Reassured patient and kept on monitoring.   Patient complained the same again . RR 18, SPO2 97.  1 L [place] initiated. Patient is satisfied.

## 2022-10-29 DIAGNOSIS — N186 End stage renal disease: Secondary | ICD-10-CM

## 2022-10-29 DIAGNOSIS — Z992 Dependence on renal dialysis: Secondary | ICD-10-CM | POA: Diagnosis not present

## 2022-10-29 DIAGNOSIS — J9601 Acute respiratory failure with hypoxia: Secondary | ICD-10-CM | POA: Diagnosis not present

## 2022-10-29 LAB — CBC
HCT: 22.1 % — ABNORMAL LOW (ref 39.0–52.0)
Hemoglobin: 7.5 g/dL — ABNORMAL LOW (ref 13.0–17.0)
MCH: 27.4 pg (ref 26.0–34.0)
MCHC: 33.9 g/dL (ref 30.0–36.0)
MCV: 80.7 fL (ref 80.0–100.0)
Platelets: 154 10*3/uL (ref 150–400)
RBC: 2.74 MIL/uL — ABNORMAL LOW (ref 4.22–5.81)
RDW: 13.9 % (ref 11.5–15.5)
WBC: 6.6 10*3/uL (ref 4.0–10.5)
nRBC: 1.1 % — ABNORMAL HIGH (ref 0.0–0.2)

## 2022-10-29 LAB — D-DIMER, QUANTITATIVE: D-Dimer, Quant: 2.32 ug/mL-FEU — ABNORMAL HIGH (ref 0.00–0.50)

## 2022-10-29 LAB — RENAL FUNCTION PANEL
Albumin: 2.5 g/dL — ABNORMAL LOW (ref 3.5–5.0)
Anion gap: 12 (ref 5–15)
BUN: 96 mg/dL — ABNORMAL HIGH (ref 8–23)
CO2: 24 mmol/L (ref 22–32)
Calcium: 8 mg/dL — ABNORMAL LOW (ref 8.9–10.3)
Chloride: 94 mmol/L — ABNORMAL LOW (ref 98–111)
Creatinine, Ser: 5.52 mg/dL — ABNORMAL HIGH (ref 0.61–1.24)
GFR, Estimated: 9 mL/min — ABNORMAL LOW (ref >60)
Glucose, Bld: 146 mg/dL — ABNORMAL HIGH (ref 70–99)
Phosphorus: 5.8 mg/dL — ABNORMAL HIGH (ref 2.5–4.6)
Potassium: 3.9 mmol/L (ref 3.5–5.1)
Sodium: 130 mmol/L — ABNORMAL LOW (ref 135–145)

## 2022-10-29 LAB — GLUCOSE, CAPILLARY
Glucose-Capillary: 108 mg/dL — ABNORMAL HIGH (ref 70–99)
Glucose-Capillary: 145 mg/dL — ABNORMAL HIGH (ref 70–99)
Glucose-Capillary: 187 mg/dL — ABNORMAL HIGH (ref 70–99)
Glucose-Capillary: 198 mg/dL — ABNORMAL HIGH (ref 70–99)
Glucose-Capillary: 209 mg/dL — ABNORMAL HIGH (ref 70–99)

## 2022-10-29 LAB — C-REACTIVE PROTEIN: CRP: 3.5 mg/dL — ABNORMAL HIGH (ref <1.0)

## 2022-10-29 MED ORDER — ANTICOAGULANT SODIUM CITRATE 4% (200MG/5ML) IV SOLN: 5.0000 mL | Status: DC | PRN

## 2022-10-29 MED ORDER — ALUM & MAG HYDROXIDE-SIMETH 200-200-20 MG/5ML PO SUSP
15.0000 mL | Freq: Four times a day (QID) | ORAL | Status: DC | PRN
  Administered 2022-10-29 – 2022-11-01 (×2): 15 mL via ORAL
  Filled 2022-10-29 (×2): qty 30

## 2022-10-29 MED ORDER — HEPARIN SODIUM (PORCINE) 1000 UNIT/ML DIALYSIS
1000.0000 [IU] | INTRAMUSCULAR | Status: DC | PRN
  Administered 2022-10-29 – 2022-11-01 (×2): 1000 [IU]
  Administered 2022-11-03: 3800 [IU]
  Filled 2022-10-29 (×9): qty 1

## 2022-10-29 MED ORDER — GUAIFENESIN-DM 100-10 MG/5ML PO SYRP
10.0000 mL | ORAL_SOLUTION | ORAL | Status: DC | PRN
  Administered 2022-10-29: 10 mL via ORAL
  Filled 2022-10-29: qty 10

## 2022-10-29 MED ORDER — ALTEPLASE 2 MG IJ SOLR: 2.0000 mg | Freq: Once | INTRAMUSCULAR | Status: DC | PRN

## 2022-10-29 NOTE — Progress Notes (Signed)
POST HD TX NOTE  10/29/22 1157  Vitals  Temp 98.4 F (36.9 C)  Temp Source Oral  BP (!) 149/57  MAP (mmHg) 83  BP Location Right Arm  BP Method Automatic  Patient Position (if appropriate) Lying  Pulse Rate 60  Pulse Rate Source Monitor  ECG Heart Rate 63  Resp 18  Oxygen Therapy  SpO2 97 %  O2 Device Room Air  Pulse Oximetry Type Continuous  During Treatment Monitoring  Intra-Hemodialysis Comments (S)   (post HD tx VS check)  Post Treatment  Dialyzer Clearance Lightly streaked  Duration of HD Treatment -hour(s) 3.5 hour(s)  Hemodialysis Intake (mL) 0 mL  Liters Processed 84  Fluid Removed (mL) 3500 mL  Tolerated HD Treatment Yes  Post-Hemodialysis Comments (S)  tx completed w/o problem, UF goal met, blood rinsed back. Medication Admin: Heparin Dwells 3800 units  Hemodialysis Catheter Right Internal jugular Double lumen Permanent (Tunneled)  Placement Date/Time: 10/27/22 1407   Serial / Lot #: [id number]  Expiration Date: 09/01/26  Time Out: Correct patient;Correct site;Correct procedure  Maximum sterile barrier precautions: Hand hygiene;Cap;Mask;Sterile gown;Sterile gloves;Large sterile ...  Site Condition No complications  Blue Lumen Status Heparin locked;Dead end cap in place  Red Lumen Status Heparin locked;Dead end cap in place  Purple Lumen Status N/A  Catheter fill solution Heparin 1000 units/ml  Catheter fill volume (Arterial) 1.9 cc  Catheter fill volume (Venous) 1.9  Dressing Type Transparent  Dressing Status Antimicrobial disc in place;Clean, Dry, Intact  Drainage Description None  Dressing Change Due 11/04/22  Post treatment catheter status Capped and Clamped

## 2022-10-29 NOTE — Progress Notes (Signed)
[facility] Progress Note   Assessment/ Plan:    AKI on Advanced CKD-->ESRD: - uremic on admission - renal US neg for obstruction - HD #1 10/25/22 - had tempcath- had a slice in the catheter- not a pinhole- see images under media tab- IR quickly placed [facility]- greatly appreciate assistance - HD #2 10/27/22 - HD #3 10/29/22   2.  Acute encephalopathy:             - likely multifactorial- uremia, COVID etc             - improving  - rest per primary   3. COVID +             - got CAP coverage             - on dex  - dopplers negative for DVT  - s/p hep gtt   4.  Anemia:             - will do iron panel             - will likely need ESA-- ordered in HD   5.  BMM:             - PTH   6.  Dispo: admitted, CLIP in process  Subjective:    Didn't sleep much last night.  For HD today   Objective:   BP 134/63 (BP Location: Left Arm)   Pulse 66   Temp 98.6 F (37 C) (Oral)   Resp 19   Ht '5\' 7"'$  (1.702 m)   Wt 81.7 kg   SpO2 96%   BMI 28.21 kg/m   Physical Exam: GEN awake and alert- up in chair HEENT EOMI PERRL NECK NO JVD PULM clear anteriorly CV RRR no rubs, soft murmur ABD soft, nontender NABS EXT 2+ pedal edema NEURO awake and alert SKIN warm and dry ACCESS: R IJ [facility]  Labs: BMET Recent Labs  Lab 10/25/22 0645 10/25/22 0651 10/25/22 0927 10/26/22 0625 10/27/22 1241  NA 142 141 145 141 132*  K 3.7 6.5* 3.5 3.5 4.0  CL 110 112* 111 104 97*  CO2 18*  --  13* 24 21*  GLUCOSE 167* 159* 166* 173* 268*  BUN 147* >130* 153* 100* 120*  CREATININE 7.45* 8.90* 7.61*  7.71* 5.49* 6.75*  CALCIUM 8.6*  --  8.7* 8.4* 8.2*  PHOS  --   --   --   --  7.2*   CBC Recent Labs  Lab 10/25/22 0555 10/25/22 0651 10/27/22 0500 10/27/22 1241 10/28/22 0039 10/29/22 0511  WBC 7.4   < > 4.3 5.9 6.6 6.6  NEUTROABS 6.8  --   --   --   --   --   HGB 8.7*   < > 9.0* 7.6* 7.9* 7.5*  HCT 25.6*   < > 28.2* 23.7* 22.7* 22.1*  MCV 82.3   < > 83.4 84.3 79.4* 80.7   PLT 325   < > 143* 172 180 154   < > = values in this interval not displayed.      Medications:     amLODipine  10 mg Oral Daily   atorvastatin  20 mg Oral Daily   Chlorhexidine Gluconate Cloth  6 each Topical Q0600   cholecalciferol  1,000 Units Oral Daily   cloNIDine  0.3 mg Oral BID   clopidogrel  75 mg Oral Daily   darbepoetin (ARANESP) injection - DIALYSIS  100 mcg Subcutaneous Q Thu-1800  dexamethasone (DECADRON) injection  6 mg Intravenous Q24H   doxazosin  2 mg Oral QHS   feeding supplement (NEPRO CARB STEADY)  237 mL Oral BID BM   folic acid  1 mg Oral Daily   heparin injection (subcutaneous)  5,000 Units Subcutaneous Q8H   insulin aspart  0-15 Units Subcutaneous TID WC   insulin aspart  0-5 Units Subcutaneous QHS   insulin aspart  3 Units Subcutaneous TID WC   isosorbide-hydrALAZINE  1 tablet Oral TID   multivitamin  1 tablet Oral QHS   pantoprazole  40 mg Oral Daily   polyethylene glycol  17 g Oral Daily   senna-docusate  1 tablet Oral BID   sodium chloride flush  3 mL Intravenous Q12H     Madelon Lips MD 10/29/2022, 7:04 AM

## 2022-10-29 NOTE — Progress Notes (Signed)
PROGRESS NOTE   Austin [name]  [id number]    DOB: 1935-07-08    DOA: 10/25/2022  PCP: Dorothyann Peng, NP   I have briefly reviewed patients previous medical records in [facility].  Chief Complaint  Patient presents with   Fever    Brief Narrative:  88 year old married male, independent, medical history significant for type II DM, HTN, CAD, prior GI bleed, CHF, sleep apnea, stage V CKD, recent placement of left upper extremity AV fistula on 10/18/2022, presented to the ED on 10/25/2022 with complaints of generalized weakness, not acting like himself, EMS noted fever of 102 F and oxygen saturation in the low 90s.  He was admitted for acute respiratory failure with hypoxia, COVID-19 acute bronchitis, acute kidney injury complicating stage V CKD with early uremia.  Nephrology was consulted, right temporary HD catheter (switched to [facility] on 3/7) was placed and started HD on 2/5.  Improving.   Assessment & Plan:  Principal Problem:   Acute respiratory failure with hypoxia (HCC) Active Problems:   Type 2 diabetes mellitus with hyperlipidemia (HCC)   Iron deficiency anemia due to chronic blood loss   Essential hypertension   GERD   Acute kidney injury superimposed on CKD ([place])   Pneumonia due to COVID-19 virus   CKD (chronic kidney disease) stage 5, GFR less than 15 ml/min (HCC)   Severe pulmonary hypertension (HCC)   Moderate aortic regurgitation   CAD in native artery   Protein-calorie malnutrition, severe   Acute kidney injury complicating stage V CKD, possibly new ESRD with volume overload/metabolic acidosis: S/p LUE AVG placed 2/27.  Nephrology consultation appreciated.  Now likely progressed to ESRD and early uremia.  Renal ultrasound without hydronephrosis.  Had right temporary HD catheter placed and initiated HD 3/5.  Due to bleeding complications from temporary HD catheter (slice in it), this was removed and TDC placed by IR 3/7.  Hyperkalemia and metabolic acidosis resolved.   Will need OP HD slot/clip.  Nephrology following.  Getting HD at bedside 3/9.  COVID 19 acute bronchitis/acute respiratory failure with hypoxia: Required [place] oxygen initially but now has been weaned off to room air.  Hypoxia resolved.  Remains on IV Decadron 6 mg daily.  On admission, did not meet criteria for Actemra.  Inflammatory markers including D-dimer elevated.  D-dimer has improved.  D-dimer starting to gradually increase over the last 2 days, follow-up tomorrow.  Was briefly on IV heparin infusion since admission until 3/7 based on earlier pharmacy recommendations given positive COVID, low oxygen needs and elevated D-dimer.  On 3/7, given that patient had low index of suspicion for PE to begin with, negative lower extremity venous Dopplers, decreasing D-dimer, bleeding from HD catheter site, and pharmacy recommendations, discontinued anticoagulation and changed to prophylactic dose.  Acute metabolic encephalopathy: Multifactorial due to ESRD with uremia, COVID-19.  Resolved.  Anemia due to ESRD: Hemoglobin has dropped from mid to low 8 g range to 7.3 in the absence of overt bleeding.  Nephrology checking iron stores and may need ESA.  Follow CBCs closely and transfuse if hemoglobin 7 g or less.  Hemoglobin checked yesterday afternoon during HD catheter site bleeding, 7.6 > 7.9 >7.5.  Stable  Type II DM with renal complications: [id number] 7.4.  Mildly uncontrolled and fluctuating.  Titrate insulins as needed, may need to increase, on steroids.  SSI was increased from very sensitive to moderate scale with improved control.  DM coordinator input appreciated and initiated NovoLog mealtime insulin 3 units.  Reasonably  controlled today.  Hypertension Continue amlodipine, clonidine, doxazosin, BiDil.  Volume management across HD.  Controlled.  Dyslipidemia Continue statins.  Chronic combined CHF/severe pulmonary hypertension/moderate aortic regurgitation: Cardiac meds as above.  Volume management  across HD.  Appears clinically euvolemic.  CAD No angina.  Continue clopidogrel and statins.  Reported OSA Unclear if patient on CPAP prior to admission-will have to verify with him.  Prior history of GI bleeding Asymptomatic.  Constipation: No BM since hospital admission.  Passing flatus.  Tolerating diet well.  Initiated bowel regimen.  Mobilize.  PT consulted.  Still without a BM  Body mass index is 26.59 kg/m.  Nutrition Status: Nutrition Problem: Moderate Malnutrition Etiology: poor appetite, chronic illness Signs/Symptoms: energy intake < or equal to 75% for > or equal to 1 month, mild fat depletion, moderate muscle depletion Interventions: MVI, Nepro shake, Liberalize Diet, Refer to RD note for recommendations, Education    DVT prophylaxis: heparin injection 5,000 Units Start: 10/27/22 1500  Added subcutaneous heparin   Code Status: Full Code:  ACP Documents: None present Family Communication: None at bedside Disposition:  Status is: Inpatient Remains inpatient appropriate because: Will need need further inpatient dialysis and outpatient HD placement.  Transfer to medical bed.     Consultants:   Nephrology Interventional radiology  Procedures:   Placement of right temporary HD catheter Placement of right TDC by IR 3/7.  Antimicrobials:   Hemodialysis 3/5, 3/7, 3/9   Subjective:  Was having HD at bedside.  Denied complaints when I saw him.  Subsequently RN requested medications for cough and congestion.  Objective:   Vitals:   10/29/22 1130 10/29/22 1144 10/29/22 1157 10/29/22 1240  BP: (!) 148/53 (!) 156/55 (!) 149/57 (!) 143/52  Pulse: (!) 42 65 60 64  Resp: '16 14 18 16  '$ Temp:   98.4 F (36.9 C) 98.5 F (36.9 C)  TempSrc:   Oral Oral  SpO2: 99% 98% 97% 96%  Weight:   77 kg   Height:        General exam: Elderly male, moderately built and nourished, looks younger than stated age, lying comfortable supine in bed.  Undergoing hemodialysis at  bedside. Respiratory system: Clear to auscultation.  No increased work of breathing.  Right infraclavicular TDC site without acute findings.  Right base of neck prior temporary HD catheter site dressing clean and dry without bleeding. Cardiovascular system: S1 & S2 heard, RRR. No JVD, murmurs, rubs, gallops or clicks.  No pedal edema.  Off of telemetry. Gastrointestinal system: Abdomen is nondistended, soft and nontender. No organomegaly or masses felt. Normal bowel sounds heard. Central nervous system: Alert and oriented. No focal neurological deficits. Extremities: Symmetric 5 x 5 power.  Left upper arm AVF thrill well-appreciated, site appears healed. Skin: No rashes, lesions or ulcers Psychiatry: Judgement and insight appear normal. Mood & affect appropriate.     Data Reviewed:   I have personally reviewed following labs and imaging studies   CBC: Recent Labs  Lab 10/25/22 0555 10/25/22 0651 10/27/22 1241 10/28/22 0039 10/29/22 0511  WBC 7.4   < > 5.9 6.6 6.6  NEUTROABS 6.8  --   --   --   --   HGB 8.7*   < > 7.6* 7.9* 7.5*  HCT 25.6*   < > 23.7* 22.7* 22.1*  MCV 82.3   < > 84.3 79.4* 80.7  PLT 325   < > 172 180 154   < > = values in this interval not displayed.  Basic Metabolic Panel: Recent Labs  Lab 10/25/22 0645 10/25/22 0651 10/25/22 0927 10/26/22 0625 10/27/22 1241 10/29/22 0510  NA 142 141 145 141 132* 130*  K 3.7 6.5* 3.5 3.5 4.0 3.9  CL 110 112* 111 104 97* 94*  CO2 18*  --  13* 24 21* 24  GLUCOSE 167* 159* 166* 173* 268* 146*  BUN 147* >130* 153* 100* 120* 96*  CREATININE 7.45* 8.90* 7.61*  7.71* 5.49* 6.75* 5.52*  CALCIUM 8.6*  --  8.7* 8.4* 8.2* 8.0*  PHOS  --   --   --   --  7.2* 5.8*    Liver Function Tests: Recent Labs  Lab 10/25/22 0645 10/27/22 1241 10/29/22 0510  AST 26  --   --   ALT 6  --   --   ALKPHOS 42  --   --   BILITOT 0.6  --   --   PROT 6.5  --   --   ALBUMIN 2.9* 2.5* 2.5*    CBG: Recent Labs  Lab 10/28/22 2354  10/29/22 0735 10/29/22 1236  GLUCAP 209* 145* 108*    Microbiology Studies:   Recent Results (from the past 240 hour(s))  Resp panel by RT-PCR (RSV, Flu A&B, Covid) Anterior Nasal Swab     Status: Abnormal   Collection Time: 10/25/22  5:46 AM   Specimen: Anterior Nasal Swab  Result Value Ref Range Status   SARS Coronavirus 2 by RT PCR POSITIVE (A) NEGATIVE Final   Influenza A by PCR NEGATIVE NEGATIVE Final   Influenza B by PCR NEGATIVE NEGATIVE Final    Comment: (NOTE) The Xpert Xpress SARS-CoV-2/FLU/RSV plus assay is intended as an aid in the diagnosis of influenza from Nasopharyngeal swab specimens and should not be used as a sole basis for treatment. Nasal washings and aspirates are unacceptable for Xpert Xpress SARS-CoV-2/FLU/RSV testing.  Fact Sheet for Patients: EntrepreneurPulse.com.au  Fact Sheet for Healthcare Providers: IncredibleEmployment.be  This test is not yet approved or cleared by the [place] FDA and has been authorized for detection and/or diagnosis of SARS-CoV-2 by FDA under an Emergency Use Authorization (EUA). This EUA will remain in effect (meaning this test can be used) for the duration of the COVID-19 declaration under Section 564(b)(1) of the Act, 21 U.S.C. section 360bbb-3(b)(1), unless the authorization is terminated or revoked.     Resp Syncytial Virus by PCR NEGATIVE NEGATIVE Final    Comment: (NOTE) Fact Sheet for Patients: EntrepreneurPulse.com.au  Fact Sheet for Healthcare Providers: IncredibleEmployment.be  This test is not yet approved or cleared by the [place] FDA and has been authorized for detection and/or diagnosis of SARS-CoV-2 by FDA under an Emergency Use Authorization (EUA). This EUA will remain in effect (meaning this test can be used) for the duration of the COVID-19 declaration under Section 564(b)(1) of the Act, 21 U.S.C. section  360bbb-3(b)(1), unless the authorization is terminated or revoked.  Performed at [facility], [place] [address] [place], [place] [address]   Blood Culture (routine x 2)     Status: None (Preliminary result)   Collection Time: 10/25/22  6:10 AM   Specimen: BLOOD RIGHT HAND  Result Value Ref Range Status   Specimen Description BLOOD RIGHT HAND  Final   Special Requests   Final    BOTTLES DRAWN AEROBIC AND ANAEROBIC Blood Culture adequate volume   Culture   Final    NO GROWTH 4 DAYS Performed at [facility], [place],  [place] [address]    Report Status PENDING  Incomplete  Blood Culture (routine x 2)     Status: None (Preliminary result)   Collection Time: 10/25/22  6:15 AM   Specimen: BLOOD RIGHT HAND  Result Value Ref Range Status   Specimen Description BLOOD RIGHT HAND  Final   Special Requests   Final    BOTTLES DRAWN AEROBIC AND ANAEROBIC Blood Culture results may not be optimal due to an inadequate volume of blood received in culture bottles   Culture   Final    NO GROWTH 4 DAYS Performed at [facility], [place] [address], [place], [place] [address]    Report Status PENDING  Incomplete    Radiology Studies:  No results found.  Scheduled Meds:    amLODipine  10 mg Oral Daily   atorvastatin  20 mg Oral Daily   Chlorhexidine Gluconate Cloth  6 each Topical Q0600   cholecalciferol  1,000 Units Oral Daily   cloNIDine  0.3 mg Oral BID   clopidogrel  75 mg Oral Daily   darbepoetin (ARANESP) injection - DIALYSIS  100 mcg Subcutaneous Q Thu-1800   dexamethasone (DECADRON) injection  6 mg Intravenous Q24H   doxazosin  2 mg Oral QHS   feeding supplement (NEPRO CARB STEADY)  237 mL Oral BID BM   folic acid  1 mg Oral Daily   heparin injection (subcutaneous)  5,000 Units Subcutaneous Q8H   insulin aspart  0-15 Units Subcutaneous TID WC   insulin aspart  0-5 Units Subcutaneous QHS   insulin aspart  3 Units Subcutaneous TID WC    isosorbide-hydrALAZINE  1 tablet Oral TID   multivitamin  1 tablet Oral QHS   pantoprazole  40 mg Oral Daily   polyethylene glycol  17 g Oral Daily   senna-docusate  1 tablet Oral BID   sodium chloride flush  3 mL Intravenous Q12H    Continuous Infusions:    anticoagulant sodium citrate        LOS: 4 days     Vernell Leep, MD,  FACP, FHM, SFHM, [facility], [facility]   [facility] [place]     To contact the attending provider between 7A-7P or the covering provider during after hours 7P-7A, please log into the web site [email or web address] and access using universal [place] password for that web site. If you do not have the password, please call the hospital operator.  10/29/2022, 2:54 PM

## 2022-10-30 DIAGNOSIS — N186 End stage renal disease: Secondary | ICD-10-CM | POA: Diagnosis not present

## 2022-10-30 DIAGNOSIS — Z992 Dependence on renal dialysis: Secondary | ICD-10-CM | POA: Diagnosis not present

## 2022-10-30 DIAGNOSIS — J9601 Acute respiratory failure with hypoxia: Secondary | ICD-10-CM | POA: Diagnosis not present

## 2022-10-30 LAB — CBC
HCT: 24.7 % — ABNORMAL LOW (ref 39.0–52.0)
Hemoglobin: 8.3 g/dL — ABNORMAL LOW (ref 13.0–17.0)
MCH: 27.6 pg (ref 26.0–34.0)
MCHC: 33.6 g/dL (ref 30.0–36.0)
MCV: 82.1 fL (ref 80.0–100.0)
Platelets: 203 10*3/uL (ref 150–400)
RBC: 3.01 MIL/uL — ABNORMAL LOW (ref 4.22–5.81)
RDW: 13.8 % (ref 11.5–15.5)
WBC: 9.5 10*3/uL (ref 4.0–10.5)
nRBC: 1.2 % — ABNORMAL HIGH (ref 0.0–0.2)

## 2022-10-30 LAB — D-DIMER, QUANTITATIVE: D-Dimer, Quant: 3 ug/mL-FEU — ABNORMAL HIGH (ref 0.00–0.50)

## 2022-10-30 LAB — CULTURE, BLOOD (ROUTINE X 2)
Culture: NO GROWTH
Culture: NO GROWTH
Special Requests: ADEQUATE

## 2022-10-30 LAB — GLUCOSE, CAPILLARY
Glucose-Capillary: 141 mg/dL — ABNORMAL HIGH (ref 70–99)
Glucose-Capillary: 170 mg/dL — ABNORMAL HIGH (ref 70–99)
Glucose-Capillary: 280 mg/dL — ABNORMAL HIGH (ref 70–99)
Glucose-Capillary: 189 mg/dL — ABNORMAL HIGH (ref 70–99)

## 2022-10-30 LAB — C-REACTIVE PROTEIN: CRP: 2.5 mg/dL — ABNORMAL HIGH (ref <1.0)

## 2022-10-30 MED ORDER — MELATONIN 3 MG PO TABS
3.0000 mg | ORAL_TABLET | Freq: Every day | ORAL | Status: DC
  Administered 2022-10-30: 3 mg via ORAL
  Filled 2022-10-30: qty 1

## 2022-10-30 MED ORDER — SODIUM CHLORIDE 0.9 % IV SOLN
250.0000 mg | INTRAVENOUS | Status: DC
  Administered 2022-11-01 – 2022-11-05 (×2): 250 mg via INTRAVENOUS
  Filled 2022-10-30 (×3): qty 20

## 2022-10-30 MED ORDER — BISACODYL 10 MG RE SUPP
10.0000 mg | Freq: Once | RECTAL | Status: DC
  Filled 2022-10-30: qty 1

## 2022-10-30 NOTE — Progress Notes (Addendum)
[facility] Progress Note   Assessment/ Plan:    AKI on Advanced CKD-->ESRD: - uremic on admission - renal US neg for obstruction - HD #1 10/25/22 - had tempcath- had a slice in the catheter- not a pinhole- see images under media tab- IR quickly placed [facility]- greatly appreciate assistance - HD #2 10/27/22 - HD #3 10/29/22 - may have spot at [facility], may depend on SNF placement though - next HD Tuesday   2.  Acute encephalopathy:             - likely multifactorial- uremia, COVID etc             - improving  - rest per primary   3. COVID +             - got CAP coverage             - on dex  - dopplers negative for DVT  - s/p hep gtt   4.  Anemia:             - iron panel with [place] 15%- will start iron with HD             - ESA started in HD   5.  BMM:             - PTH   6.  Dispo: admitted, CLIP in process, may need SNF  Subjective:    Tolerated HD well yesterday.  Still not sleeping very well at night.     Objective:   BP (!) 141/51   Pulse 70   Temp 99.3 F (37.4 C) (Oral)   Resp 16   Ht '5\' 7"'$  (1.702 m)   Wt 77 kg   SpO2 95%   BMI 26.59 kg/m   Physical Exam: GEN awake and alert- up in chair HEENT EOMI PERRL NECK NO JVD PULM clear anteriorly CV RRR no rubs, soft murmur ABD soft, nontender NABS EXT 2+ pedal edema NEURO awake and alert SKIN warm and dry ACCESS: R IJ [facility]  Labs: BMET Recent Labs  Lab 10/25/22 0645 10/25/22 0651 10/25/22 0927 10/26/22 0625 10/27/22 1241 10/29/22 0510  NA 142 141 145 141 132* 130*  K 3.7 6.5* 3.5 3.5 4.0 3.9  CL 110 112* 111 104 97* 94*  CO2 18*  --  13* 24 21* 24  GLUCOSE 167* 159* 166* 173* 268* 146*  BUN 147* >130* 153* 100* 120* 96*  CREATININE 7.45* 8.90* 7.61*  7.71* 5.49* 6.75* 5.52*  CALCIUM 8.6*  --  8.7* 8.4* 8.2* 8.0*  PHOS  --   --   --   --  7.2* 5.8*   CBC Recent Labs  Lab 10/25/22 0555 10/25/22 0651 10/27/22 1241 10/28/22 0039 10/29/22 0511 10/30/22 0445  WBC 7.4   < > 5.9  6.6 6.6 9.5  NEUTROABS 6.8  --   --   --   --   --   HGB 8.7*   < > 7.6* 7.9* 7.5* 8.3*  HCT 25.6*   < > 23.7* 22.7* 22.1* 24.7*  MCV 82.3   < > 84.3 79.4* 80.7 82.1  PLT 325   < > 172 180 154 203   < > = values in this interval not displayed.      Medications:     amLODipine  10 mg Oral Daily   atorvastatin  20 mg Oral Daily   Chlorhexidine Gluconate Cloth  6 each Topical Q0600  cholecalciferol  1,000 Units Oral Daily   cloNIDine  0.3 mg Oral BID   clopidogrel  75 mg Oral Daily   darbepoetin (ARANESP) injection - DIALYSIS  100 mcg Subcutaneous Q Thu-1800   dexamethasone (DECADRON) injection  6 mg Intravenous Q24H   doxazosin  2 mg Oral QHS   feeding supplement (NEPRO CARB STEADY)  237 mL Oral BID BM   folic acid  1 mg Oral Daily   heparin injection (subcutaneous)  5,000 Units Subcutaneous Q8H   insulin aspart  0-15 Units Subcutaneous TID WC   insulin aspart  0-5 Units Subcutaneous QHS   insulin aspart  3 Units Subcutaneous TID WC   isosorbide-hydrALAZINE  1 tablet Oral TID   multivitamin  1 tablet Oral QHS   pantoprazole  40 mg Oral Daily   polyethylene glycol  17 g Oral Daily   senna-docusate  1 tablet Oral BID   sodium chloride flush  3 mL Intravenous Q12H     Madelon Lips MD 10/30/2022, 9:32 AM

## 2022-10-30 NOTE — Progress Notes (Signed)
PROGRESS NOTE   Austin [name]  [id number]    DOB: July 05, 1935    DOA: 10/25/2022  PCP: Dorothyann Peng, NP   I have briefly reviewed patients previous medical records in [facility].  Chief Complaint  Patient presents with   Fever    Brief Narrative:  88 year old married male, independent, medical history significant for type II DM, HTN, CAD, prior GI bleed, CHF, sleep apnea, stage V CKD, recent placement of left upper extremity AV fistula on 10/18/2022, presented to the ED on 10/25/2022 with complaints of generalized weakness, not acting like himself, EMS noted fever of 102 F and oxygen saturation in the low 90s.  He was admitted for acute respiratory failure with hypoxia, COVID-19 acute bronchitis, acute kidney injury complicating stage V CKD with early uremia.  Nephrology was consulted, right temporary HD catheter (switched to [facility] on 3/7) was placed and started HD on 2/5.  Improving.   Assessment & Plan:  Principal Problem:   Acute respiratory failure with hypoxia (HCC) Active Problems:   Type 2 diabetes mellitus with hyperlipidemia (HCC)   Iron deficiency anemia due to chronic blood loss   Essential hypertension   GERD   Acute kidney injury superimposed on CKD ([place])   Pneumonia due to COVID-19 virus   CKD (chronic kidney disease) stage 5, GFR less than 15 ml/min (HCC)   Severe pulmonary hypertension (HCC)   Moderate aortic regurgitation   CAD in native artery   Protein-calorie malnutrition, severe   ESRD on dialysis ([place])   Acute kidney injury complicating stage V CKD, possibly new ESRD with volume overload/metabolic acidosis: S/p LUE AVG placed 2/27.  Nephrology consultation appreciated.  Now likely progressed to ESRD and early uremia.  Renal ultrasound without hydronephrosis.  Had right temporary HD catheter placed and initiated HD 3/5.  Due to bleeding complications from temporary HD catheter (slice in it), this was removed and TDC placed by IR 3/7.  Hyperkalemia and  metabolic acidosis resolved.  Will need OP HD slot/clip.  Nephrology following. S/p HD at bedside 3/9.  Next HD 3/12.  COVID 19 acute bronchitis/acute respiratory failure with hypoxia: Required [place] oxygen initially but now has been weaned off to room air.  Hypoxia resolved.  Remains on IV Decadron 6 mg daily.  On admission, did not meet criteria for Actemra.  Inflammatory markers including D-dimer elevated.  D-dimer had improved but gradually creeping up, unclear etiology.  Was briefly on IV heparin infusion since admission until 3/7 based on earlier pharmacy recommendations given positive COVID, low oxygen needs and elevated D-dimer.  On 3/7, given that patient had low index of suspicion for PE to begin with, negative lower extremity venous Dopplers, decreasing D-dimer, bleeding from HD catheter site, and pharmacy recommendations, discontinued anticoagulation and changed to prophylactic dose.  Acute metabolic encephalopathy: Multifactorial due to ESRD with uremia, COVID-19.  Resolved.  Anemia due to ESRD: Hemoglobin up to 8.3.  Stable.  Nephrology has started iron and ESA with HD.  Type II DM with renal complications: [id number] 7.4.  Mildly uncontrolled and fluctuating.  Titrate insulins as needed, may need to increase, on steroids.  SSI was increased from very sensitive to moderate scale with improved control.  DM coordinator input appreciated and initiated NovoLog mealtime insulin 3 units.  Reasonably controlled today.  Hypertension Continue amlodipine, clonidine, doxazosin, BiDil.  Volume management across HD.  Controlled.  Dyslipidemia Continue statins.  Chronic combined CHF/severe pulmonary hypertension/moderate aortic regurgitation: Cardiac meds as above.  Volume management across HD.  Appears clinically euvolemic.  CAD No angina.  Continue clopidogrel and statins.  Reported OSA Unclear if patient on CPAP prior to admission-will have to verify with him.  Prior history of GI  bleeding Asymptomatic.  Constipation: No BM since hospital admission.  Passing flatus.  Tolerating diet well.  Initiated bowel regimen.  Mobilize.  PT consulted.  Still without a BM, agreeable to suppository.  Insomnia Started melatonin at bedtime.  Body mass index is 26.59 kg/m.  Nutrition Status: Nutrition Problem: Moderate Malnutrition Etiology: poor appetite, chronic illness Signs/Symptoms: energy intake < or equal to 75% for > or equal to 1 month, mild fat depletion, moderate muscle depletion Interventions: MVI, Nepro shake, Liberalize Diet, Refer to RD note for recommendations, Education    DVT prophylaxis: heparin injection 5,000 Units Start: 10/27/22 1500  Added subcutaneous heparin   Code Status: Full Code:  ACP Documents: None present Family Communication: None at bedside Disposition:  Status is: Inpatient Remains inpatient appropriate because: Will need need further inpatient dialysis and outpatient HD placement.  Transfer to medical bed.     Consultants:   Nephrology Interventional radiology  Procedures:   Placement of right temporary HD catheter Placement of right TDC by IR 3/7.  Antimicrobials:   Hemodialysis 3/5, 3/7, 3/9   Subjective:  Has not been sleeping well at night.  Also no BM despite bowel regimen.  Some cough with congestion but intermittent and mild.  No other complaints.  Per nursing, no acute issues.  Objective:   Vitals:   10/29/22 1554 10/29/22 2040 10/30/22 0624 10/30/22 0827  BP: (!) 119/50 (!) 143/52 (!) 137/57 (!) 141/51  Pulse: 81 71 65 70  Resp: '16 16 16   '$ Temp: 97.7 F (36.5 C) 97.7 F (36.5 C) 98.7 F (37.1 C) 99.3 F (37.4 C)  TempSrc: Oral Oral Oral Oral  SpO2: 100% 100% 96% 95%  Weight:      Height:        General exam: Elderly male, moderately built and nourished, looks younger than stated age, lying comfortable supine in bed.   Respiratory system: Clear to auscultation.  No increased work of breathing.  Right  infraclavicular TDC site without acute findings.  Right base of neck prior temporary HD catheter site dressing clean and dry without bleeding. Cardiovascular system: S1 & S2 heard, RRR. No JVD, murmurs, rubs, gallops or clicks.  No pedal edema.  Off of telemetry. Gastrointestinal system: Abdomen is nondistended, soft and nontender. No organomegaly or masses felt. Normal bowel sounds heard. Central nervous system: Alert and oriented. No focal neurological deficits. Extremities: Symmetric 5 x 5 power.  Left upper arm AVF thrill well-appreciated, site appears healed. Skin: No rashes, lesions or ulcers Psychiatry: Judgement and insight appear normal. Mood & affect appropriate.     Data Reviewed:   I have personally reviewed following labs and imaging studies   CBC: Recent Labs  Lab 10/25/22 0555 10/25/22 0651 10/28/22 0039 10/29/22 0511 10/30/22 0445  WBC 7.4   < > 6.6 6.6 9.5  NEUTROABS 6.8  --   --   --   --   HGB 8.7*   < > 7.9* 7.5* 8.3*  HCT 25.6*   < > 22.7* 22.1* 24.7*  MCV 82.3   < > 79.4* 80.7 82.1  PLT 325   < > 180 154 203   < > = values in this interval not displayed.    Basic Metabolic Panel: Recent Labs  Lab 10/25/22 0645 10/25/22 0651 10/25/22 0927 10/26/22  [id number] 10/27/22 1241 10/29/22 0510  NA 142 141 145 141 132* 130*  K 3.7 6.5* 3.5 3.5 4.0 3.9  CL 110 112* 111 104 97* 94*  CO2 18*  --  13* 24 21* 24  GLUCOSE 167* 159* 166* 173* 268* 146*  BUN 147* >130* 153* 100* 120* 96*  CREATININE 7.45* 8.90* 7.61*  7.71* 5.49* 6.75* 5.52*  CALCIUM 8.6*  --  8.7* 8.4* 8.2* 8.0*  PHOS  --   --   --   --  7.2* 5.8*    Liver Function Tests: Recent Labs  Lab 10/25/22 0645 10/27/22 1241 10/29/22 0510  AST 26  --   --   ALT 6  --   --   ALKPHOS 42  --   --   BILITOT 0.6  --   --   PROT 6.5  --   --   ALBUMIN 2.9* 2.5* 2.5*    CBG: Recent Labs  Lab 10/29/22 2039 10/30/22 0824 10/30/22 1126  GLUCAP 187* 141* 170*    Microbiology Studies:   Recent  Results (from the past 240 hour(s))  Resp panel by RT-PCR (RSV, Flu A&B, Covid) Anterior Nasal Swab     Status: Abnormal   Collection Time: 10/25/22  5:46 AM   Specimen: Anterior Nasal Swab  Result Value Ref Range Status   SARS Coronavirus 2 by RT PCR POSITIVE (A) NEGATIVE Final   Influenza A by PCR NEGATIVE NEGATIVE Final   Influenza B by PCR NEGATIVE NEGATIVE Final    Comment: (NOTE) The Xpert Xpress SARS-CoV-2/FLU/RSV plus assay is intended as an aid in the diagnosis of influenza from Nasopharyngeal swab specimens and should not be used as a sole basis for treatment. Nasal washings and aspirates are unacceptable for Xpert Xpress SARS-CoV-2/FLU/RSV testing.  Fact Sheet for Patients: EntrepreneurPulse.com.au  Fact Sheet for Healthcare Providers: IncredibleEmployment.be  This test is not yet approved or cleared by the [place] FDA and has been authorized for detection and/or diagnosis of SARS-CoV-2 by FDA under an Emergency Use Authorization (EUA). This EUA will remain in effect (meaning this test can be used) for the duration of the COVID-19 declaration under Section 564(b)(1) of the Act, 21 U.S.C. section 360bbb-3(b)(1), unless the authorization is terminated or revoked.     Resp Syncytial Virus by PCR NEGATIVE NEGATIVE Final    Comment: (NOTE) Fact Sheet for Patients: EntrepreneurPulse.com.au  Fact Sheet for Healthcare Providers: IncredibleEmployment.be  This test is not yet approved or cleared by the [place] FDA and has been authorized for detection and/or diagnosis of SARS-CoV-2 by FDA under an Emergency Use Authorization (EUA). This EUA will remain in effect (meaning this test can be used) for the duration of the COVID-19 declaration under Section 564(b)(1) of the Act, 21 U.S.C. section 360bbb-3(b)(1), unless the authorization is terminated or revoked.  Performed at [place], [place] [address] [place], [place] [address]   Blood Culture (routine x 2)     Status: None   Collection Time: 10/25/22  6:10 AM   Specimen: BLOOD RIGHT HAND  Result Value Ref Range Status   Specimen Description BLOOD RIGHT HAND  Final   Special Requests   Final    BOTTLES DRAWN AEROBIC AND ANAEROBIC Blood Culture adequate volume   Culture   Final    NO GROWTH 5 DAYS Performed at [facility], [place] [address] [place], [place] [address]    Report Status 10/30/2022 FINAL  Final  Blood Culture (routine x  2)     Status: None   Collection Time: 10/25/22  6:15 AM   Specimen: BLOOD RIGHT HAND  Result Value Ref Range Status   Specimen Description BLOOD RIGHT HAND  Final   Special Requests   Final    BOTTLES DRAWN AEROBIC AND ANAEROBIC Blood Culture results may not be optimal due to an inadequate volume of blood received in culture bottles   Culture   Final    NO GROWTH 5 DAYS Performed at [facility], [place] [address] [place], [place] [address]    Report Status 10/30/2022 FINAL  Final    Radiology Studies:  No results found.  Scheduled Meds:    amLODipine  10 mg Oral Daily   atorvastatin  20 mg Oral Daily   Chlorhexidine Gluconate Cloth  6 each Topical Q0600   cholecalciferol  1,000 Units Oral Daily   cloNIDine  0.3 mg Oral BID   clopidogrel  75 mg Oral Daily   darbepoetin (ARANESP) injection - DIALYSIS  100 mcg Subcutaneous Q Thu-1800   dexamethasone (DECADRON) injection  6 mg Intravenous Q24H   doxazosin  2 mg Oral QHS   feeding supplement (NEPRO CARB STEADY)  237 mL Oral BID BM   folic acid  1 mg Oral Daily   heparin injection (subcutaneous)  5,000 Units Subcutaneous Q8H   insulin aspart  0-15 Units Subcutaneous TID WC   insulin aspart  0-5 Units Subcutaneous QHS   insulin aspart  3 Units Subcutaneous TID WC   isosorbide-hydrALAZINE  1 tablet Oral TID   multivitamin  1 tablet Oral QHS   pantoprazole  40 mg Oral Daily   polyethylene glycol  17 g Oral Daily    senna-docusate  1 tablet Oral BID   sodium chloride flush  3 mL Intravenous Q12H    Continuous Infusions:    anticoagulant sodium citrate     [START ON 11/01/2022] ferric gluconate (FERRLECIT) IVPB        LOS: 5 days     Vernell Leep, MD,  FACP, FHM, SFHM, [facility]   [facility] [place]     To contact the attending provider between 7A-7P or the covering provider during after hours 7P-7A, please log into the web site [email or web address] and access using universal  password for that web site. If you do not have the password, please call the hospital operator.  10/30/2022, 3:14 PM

## 2022-10-31 DIAGNOSIS — J9601 Acute respiratory failure with hypoxia: Secondary | ICD-10-CM | POA: Diagnosis not present

## 2022-10-31 DIAGNOSIS — N186 End stage renal disease: Secondary | ICD-10-CM | POA: Diagnosis not present

## 2022-10-31 DIAGNOSIS — Z992 Dependence on renal dialysis: Secondary | ICD-10-CM | POA: Diagnosis not present

## 2022-10-31 LAB — CBC
HCT: 22.7 % — ABNORMAL LOW (ref 39.0–52.0)
Hemoglobin: 7.7 g/dL — ABNORMAL LOW (ref 13.0–17.0)
MCH: 27.6 pg (ref 26.0–34.0)
MCHC: 33.9 g/dL (ref 30.0–36.0)
MCV: 81.4 fL (ref 80.0–100.0)
Platelets: 249 10*3/uL (ref 150–400)
RBC: 2.79 MIL/uL — ABNORMAL LOW (ref 4.22–5.81)
RDW: 13.8 % (ref 11.5–15.5)
WBC: 12.5 10*3/uL — ABNORMAL HIGH (ref 4.0–10.5)
nRBC: 1.4 % — ABNORMAL HIGH (ref 0.0–0.2)

## 2022-10-31 LAB — GLUCOSE, CAPILLARY
Glucose-Capillary: 169 mg/dL — ABNORMAL HIGH (ref 70–99)
Glucose-Capillary: 211 mg/dL — ABNORMAL HIGH (ref 70–99)
Glucose-Capillary: 290 mg/dL — ABNORMAL HIGH (ref 70–99)
Glucose-Capillary: 235 mg/dL — ABNORMAL HIGH (ref 70–99)
Glucose-Capillary: 211 mg/dL — ABNORMAL HIGH (ref 70–99)

## 2022-10-31 LAB — D-DIMER, QUANTITATIVE: D-Dimer, Quant: 3.86 ug/mL-FEU — ABNORMAL HIGH (ref 0.00–0.50)

## 2022-10-31 LAB — C-REACTIVE PROTEIN: CRP: 2.2 mg/dL — ABNORMAL HIGH (ref <1.0)

## 2022-10-31 MED ORDER — TRAZODONE HCL 50 MG PO TABS
50.0000 mg | ORAL_TABLET | Freq: Every day | ORAL | Status: DC
  Administered 2022-10-31 – 2022-11-04 (×5): 50 mg via ORAL
  Filled 2022-10-31 (×5): qty 1

## 2022-10-31 MED ORDER — CHLORHEXIDINE GLUCONATE CLOTH 2 % EX PADS
6.0000 | MEDICATED_PAD | Freq: Every day | CUTANEOUS | Status: DC
  Administered 2022-10-31 – 2022-11-02 (×3): 6 via TOPICAL

## 2022-10-31 MED ORDER — CALCIUM ACETATE (PHOS BINDER) 667 MG PO CAPS
667.0000 mg | ORAL_CAPSULE | Freq: Three times a day (TID) | ORAL | Status: DC
  Administered 2022-10-31 – 2022-11-04 (×13): 667 mg via ORAL
  Filled 2022-10-31 (×13): qty 1

## 2022-10-31 NOTE — TOC Initial Note (Signed)
Transition of Care [facility]) - Initial/Assessment Note    Patient Details  Name: Austin [name] MRN: [id number] Date of Birth: 09/18/1934  Transition of Care [facility]) CM/SW Contact:    Jinger Neighbors, LCSW Phone Number: 10/31/2022, 3:21 PM  Clinical Narrative:                  CSW met with pt and his wife at bedside. Pt is alert and oriented and appears stated age. CSW discussed SNF recommendations and pt is in agreement. CSW provided pt's wife with SCAT print out that [place], [facility] printed for pt. CSW will complete SNF work up.   Expected Discharge Plan: Skilled Nursing Facility Barriers to Discharge: Continued Medical Work up   Patient Goals and CMS Choice Patient states their goals for this hospitalization and ongoing recovery are:: to return home if able - patient's home has stairs and bedroom is located upstairs per wife CMS Medicare.gov Compare Post Acute Care list provided to:: Patient Choice offered to / list presented to : Patient      Expected Discharge Plan and Services   Discharge Planning Services: CM Consult   Living arrangements for the past 2 months: Single Family Home                                      Prior Living Arrangements/Services Living arrangements for the past 2 months: Single Family Home Lives with:: Spouse Patient language and need for interpreter reviewed:: Yes Do you feel safe going back to the place where you live?: Yes      Need for Family Participation in Patient Care: Yes (Comment) Care giver support system in place?: Yes (comment) Current home services: DME (BSC and RW at home) Criminal Activity/Legal Involvement Pertinent to Current Situation/Hospitalization: No - Comment as needed  Activities of Daily Living Home Assistive Devices/Equipment: CBG Meter ADL Screening (condition at time of admission) Patient's cognitive ability adequate to safely complete daily activities?: Yes Is the patient deaf or have difficulty hearing?: No Does the  patient have difficulty seeing, even when wearing glasses/contacts?: No Does the patient have difficulty concentrating, remembering, or making decisions?: No Patient able to express need for assistance with ADLs?: Yes Does the patient have difficulty dressing or bathing?: No Independently performs ADLs?: Yes (appropriate for developmental age) Does the patient have difficulty walking or climbing stairs?: No Weakness of Legs: Both Weakness of Arms/Hands: Both  Permission Sought/Granted Permission sought to share information with : Case Manager, Family Supports, Customer service manager Permission granted to share information with : Yes, Verbal Permission Granted        Permission granted to share info w Relationship: wife - [phone]     Emotional Assessment Appearance:: Appears stated age Attitude/Demeanor/Rapport: Engaged Affect (typically observed): Appropriate Orientation: : Oriented to Self, Oriented to Place, Oriented to  Time, Oriented to Situation Alcohol / Substance Use: Not Applicable Psych Involvement: No (comment)  Admission diagnosis:  Hyperkalemia [E87.5] Acute respiratory failure with hypoxia ([place]) [J96.01] AKI (acute kidney injury) ([place]) [N17.9] Community acquired pneumonia, unspecified laterality [J18.9] COVID-19 [U07.1] Patient Active Problem List   Diagnosis Date Noted   ESRD on dialysis ([place]) 10/29/2022   Protein-calorie malnutrition, severe 10/28/2022   Pneumonia due to COVID-19 virus 10/25/2022   CKD (chronic kidney disease) stage 5, GFR less than 15 ml/min ([place]) 10/25/2022   Severe pulmonary hypertension ([place]) 10/25/2022   Moderate aortic regurgitation 10/25/2022   CAD  in native artery 10/25/2022   UTI (urinary tract infection) 09/27/2021   CHF exacerbation ([place]) 09/26/2021   Acute respiratory failure with hypoxia ([place]) 09/26/2021   Acute on chronic diastolic CHF (congestive heart failure) ([place]) 07/09/2021   Dyspnea 06/01/2021   Upper airway  cough syndrome 04/06/2021   Porokeratosis 03/26/2021   Pulmonary nodules 10/22/2020   Acute on chronic renal failure ([place]) 10/09/2020   Mass of right lung 09/18/2020   Acute kidney injury superimposed on CKD ([place]) 08/04/2019   Elevated troponin 08/04/2019   Hypokalemia 08/04/2019   Hypertensive urgency 03/28/2019   Elevated serum immunoglobulin free light chains 02/20/2019   Gastrointestinal hemorrhage with melena    Melena 11/03/2016   Carotid artery stenosis 05/10/2010   Type 2 diabetes mellitus with hyperlipidemia ([place]) 09/19/2007   Iron deficiency anemia due to chronic blood loss 03/13/2007   GERD 03/13/2007   Essential hypertension 03/05/2007   DISEASE, CEREBROVASCULAR NEC 03/05/2007   PROSTATE CANCER, HX OF 03/05/2007   PCP:  Dorothyann Peng, NP Pharmacy:   [id number] [place]  [id number]: [phone]: [id number]: [phone]: [phone]    Social Determinants of Health (SDOH) Social History: [place] No Food Insecurity (07/22/2022)  Housing: Low Risk  (07/22/2022)  Transportation Needs: No Transportation Needs (07/27/2022)  Utilities: Not At Risk (07/22/2022)  Alcohol Screen: Low Risk  (02/10/2022)  Depression (PHQ2-9): Low Risk  (07/22/2022)  Financial Resource Strain: Low Risk  (02/10/2022)  Physical Activity: Insufficiently Active (02/10/2022)  Social Connections: Socially Integrated (02/10/2022)  Stress: No Stress Concern Present (02/10/2022)  Tobacco Use: Medium Risk (10/27/2022)   SDOH Interventions:     Readmission Risk Interventions    10/28/2022    2:49 PM 09/30/2021   11:17 AM 07/13/2021   12:43 PM  Readmission Risk Prevention Plan  Transportation Screening Complete Complete Complete  PCP or Specialist Appt within 5-7  Days Complete    PCP or Specialist Appt within 3-5 Days  Complete Complete  Home Care Screening Complete    Medication Review (RN CM) Complete    HRI or Home Care Consult  Complete Complete  Social Work Consult for RDenisonPlanning/Counseling  Complete Complete  Palliative Care Screening  Not Applicable Not Applicable  Medication Review (Press photographer  Complete Complete

## 2022-10-31 NOTE — Progress Notes (Addendum)
Noted PT rec for snf at d/c. Will await final d/c plan to see if pt admits to snf and if snf will be able to transport to [place] on MWF. Will assist as needed.   Melven Sartorius Renal Navigator [phone]  Addendum at 3:02 pm: Case discussed with [facility] staff. D/C plan TBD and navigator to assist with out-pt HD arrangements according to d/c plan.

## 2022-10-31 NOTE — NC FL2 (Signed)
[place] LEVEL OF CARE FORM     IDENTIFICATION  Patient Name: Austin [name] Birthdate: 1934-12-16 Sex: male Admission Date (Current Location): 10/25/2022  [facility] and [place] Number:  Herbalist and Address:  [facility], [place] [address] [place], [place] [address]      Provider Number: [id number]  Attending Physician Name and Address:  Modena Jansky, MD  Relative Name and Phone Number:  Epimenio, Mittag Spouse [phone]  [phone]    Current Level of Care: Hospital Recommended Level of Care: [place] Prior Approval Number:    Date Approved/Denied:   PASRR Number: [id number]  Discharge Plan: SNF    Current Diagnoses: Patient Active Problem List   Diagnosis Date Noted   ESRD on dialysis ([place]) 10/29/2022   Protein-calorie malnutrition, severe 10/28/2022   Pneumonia due to COVID-19 virus 10/25/2022   CKD (chronic kidney disease) stage 5, GFR less than 15 ml/min ([place]) 10/25/2022   Severe pulmonary hypertension ([place]) 10/25/2022   Moderate aortic regurgitation 10/25/2022   CAD in native artery 10/25/2022   UTI (urinary tract infection) 09/27/2021   CHF exacerbation ([place]) 09/26/2021   Acute respiratory failure with hypoxia ([place]) 09/26/2021   Acute on chronic diastolic CHF (congestive heart failure) ([place]) 07/09/2021   Dyspnea 06/01/2021   Upper airway cough syndrome 04/06/2021   Porokeratosis 03/26/2021   Pulmonary nodules 10/22/2020   Acute on chronic renal failure ([place]) 10/09/2020   Mass of right lung 09/18/2020   Acute kidney injury superimposed on CKD ([place]) 08/04/2019   Elevated troponin 08/04/2019   Hypokalemia 08/04/2019   Hypertensive urgency 03/28/2019   Elevated serum immunoglobulin free light chains 02/20/2019   Gastrointestinal hemorrhage with melena    Melena 11/03/2016   Carotid artery stenosis 05/10/2010   Type 2 diabetes mellitus with hyperlipidemia ([place]) 09/19/2007   Iron deficiency  anemia due to chronic blood loss 03/13/2007   GERD 03/13/2007   Essential hypertension 03/05/2007   DISEASE, CEREBROVASCULAR NEC 03/05/2007   PROSTATE CANCER, HX OF 03/05/2007    Orientation RESPIRATION BLADDER Height & Weight     Self, Time, Situation, Place  Normal Continent Weight: 174 lb 9.7 oz (79.2 kg) Height:  '5\' 7"'$  (170.2 cm)  BEHAVIORAL SYMPTOMS/MOOD NEUROLOGICAL BOWEL NUTRITION STATUS      Continent Diet (See dc summary)  AMBULATORY STATUS COMMUNICATION OF NEEDS Skin   Extensive Assist Verbally Normal                       Personal Care Assistance Level of Assistance  Bathing, Feeding, Dressing Bathing Assistance: Limited assistance Feeding assistance: Independent Dressing Assistance: Limited assistance     Functional Limitations Info  Sight, Hearing, Speech Sight Info: Adequate Hearing Info: Adequate Speech Info: Adequate    SPECIAL CARE FACTORS FREQUENCY  OT (By licensed OT), PT (By licensed PT)     PT Frequency: 5x/wk OT Frequency: 5x.wk            Contractures Contractures Info: Not present    Additional Factors Info  Code Status Code Status Info: Full             Current Medications (10/31/2022):  This is the current hospital active medication list Current Facility-Administered Medications  Medication Dose Route Frequency Provider Last Rate Last Admin   acetaminophen (TYLENOL) tablet 650 mg  650 mg Oral Q6H PRN Samella Parr, NP   650 mg at 10/30/22 0955   Or   acetaminophen (TYLENOL)  suppository 650 mg  650 mg Rectal Q6H PRN Samella Parr, NP       alteplase (CATHFLO ACTIVASE) injection 2 mg  2 mg Intracatheter Once PRN Madelon Lips, MD       alum & mag hydroxide-simeth (MAALOX/MYLANTA) 200-200-20 MG/5ML suspension 15 mL  15 mL Oral QID PRN Hosie Poisson, MD   15 mL at 10/29/22 2352   amLODipine (NORVASC) tablet 10 mg  10 mg Oral Daily Samella Parr, NP   10 mg at 10/31/22 0857   anticoagulant sodium citrate solution 5 mL   5 mL Intracatheter PRN Madelon Lips, MD       atorvastatin (LIPITOR) tablet 20 mg  20 mg Oral Daily Samella Parr, NP   20 mg at 10/31/22 0857   bisacodyl (DULCOLAX) EC tablet 5 mg  5 mg Oral Daily PRN Vernell Leep D, MD   5 mg at 10/30/22 0843   bisacodyl (DULCOLAX) suppository 10 mg  10 mg Rectal Once Vernell Leep D, MD       calcium acetate (PHOSLO) capsule 667 mg  667 mg Oral TID WC Corliss Parish, MD   667 mg at 10/31/22 1251   Chlorhexidine Gluconate Cloth 2 % PADS 6 each  6 each Topical Q0600 Madelon Lips, MD   6 each at 10/30/22 1251   Chlorhexidine Gluconate Cloth 2 % PADS 6 each  6 each Topical Q0600 Corliss Parish, MD   6 each at 10/31/22 1252   cholecalciferol (VITAMIN D3) 25 MCG (1000 UNIT) tablet 1,000 Units  1,000 Units Oral Daily Samella Parr, NP   1,000 Units at 10/31/22 0855   cloNIDine (CATAPRES) tablet 0.3 mg  0.3 mg Oral BID Samella Parr, NP   0.3 mg at 10/31/22 0855   clopidogrel (PLAVIX) tablet 75 mg  75 mg Oral Daily Samella Parr, NP   75 mg at 10/31/22 0855   Darbepoetin Alfa (ARANESP) injection 100 mcg  100 mcg Subcutaneous Q Thu-1800 Madelon Lips, MD   100 mcg at 10/27/22 1750   dexamethasone (DECADRON) injection 6 mg  6 mg Intravenous Q24H Samella Parr, NP   6 mg at 10/31/22 0856   doxazosin (CARDURA) tablet 2 mg  2 mg Oral QHS Samella Parr, NP   2 mg at 10/30/22 2200   feeding supplement (NEPRO CARB STEADY) liquid 237 mL  237 mL Oral BID BM Hongalgi, Anand D, MD   237 mL at 10/31/22 1331   [START ON 11/01/2022] ferric gluconate (FERRLECIT) 250 mg in sodium chloride 0.9 % 250 mL IVPB  250 mg Intravenous Q T,Th,Sa-HD Madelon Lips, MD       folic acid (FOLVITE) tablet 1 mg  1 mg Oral Daily Samella Parr, NP   1 mg at 10/31/22 0855   guaiFENesin-dextromethorphan (ROBITUSSIN DM) 100-10 MG/5ML syrup 10 mL  10 mL Oral Q4H PRN Vernell Leep D, MD   10 mL at 10/29/22 1242   heparin injection 1,000 Units  1,000 Units  Intracatheter PRN Madelon Lips, MD   1,000 Units at 10/29/22 1155   heparin injection 5,000 Units  5,000 Units Subcutaneous Q8H Hongalgi, Everlene Farrier D, MD   5,000 Units at 10/31/22 1452   insulin aspart (novoLOG) injection 0-15 Units  0-15 Units Subcutaneous TID WC Modena Jansky, MD   5 Units at 10/31/22 1313   insulin aspart (novoLOG) injection 0-5 Units  0-5 Units Subcutaneous QHS Modena Jansky, MD   2 Units at 10/28/22 2359  insulin aspart (novoLOG) injection 3 Units  3 Units Subcutaneous TID WC Modena Jansky, MD   3 Units at 10/31/22 1313   isosorbide-hydrALAZINE (BIDIL) 20-37.5 MG per tablet 1 tablet  1 tablet Oral TID Samella Parr, NP   1 tablet at 10/31/22 0855   multivitamin (RENA-VIT) tablet 1 tablet  1 tablet Oral QHS Modena Jansky, MD   1 tablet at 10/30/22 2113   ondansetron (ZOFRAN) tablet 4 mg  4 mg Oral Q6H PRN Samella Parr, NP       Or   ondansetron [facility]) injection 4 mg  4 mg Intravenous Q6H PRN Samella Parr, NP       pantoprazole (PROTONIX) EC tablet 40 mg  40 mg Oral Daily Erin Hearing L, NP   40 mg at 10/31/22 0855   polyethylene glycol (MIRALAX / GLYCOLAX) packet 17 g  17 g Oral Daily Modena Jansky, MD   17 g at 10/31/22 0854   senna-docusate (Senokot-S) tablet 1 tablet  1 tablet Oral BID Vernell Leep D, MD   1 tablet at 10/31/22 0856   sodium chloride flush (NS) 0.9 % injection 3 mL  3 mL Intravenous Q12H Samella Parr, NP   3 mL at 10/31/22 0857   traZODone (DESYREL) tablet 50 mg  50 mg Oral QHS Hongalgi, Lenis Dickinson, MD         Discharge Medications: Please see discharge summary for a list of discharge medications.  Relevant Imaging Results:  Relevant Lab Results:   Additional Information SS#: [id number]  Jinger Neighbors, LCSW

## 2022-10-31 NOTE — Progress Notes (Signed)
PROGRESS NOTE   Austin [name]  [id number]    DOB: 1935-04-26    DOA: 10/25/2022  PCP: Dorothyann Peng, NP   I have briefly reviewed patients previous medical records in [facility].  Chief Complaint  Patient presents with   Fever    Brief Narrative:  88 year old married male, independent, medical history significant for type II DM, HTN, CAD, prior GI bleed, CHF, sleep apnea, stage V CKD, recent placement of left upper extremity AV fistula on 10/18/2022, presented to the ED on 10/25/2022 with complaints of generalized weakness, not acting like himself, EMS noted fever of 102 F and oxygen saturation in the low 90s.  He was admitted for acute respiratory failure with hypoxia, COVID-19 acute bronchitis, acute kidney injury complicating stage V CKD with early uremia.  Nephrology was consulted, right temporary HD catheter (switched to [facility] on 3/7) was placed and started HD on 2/5.  Improving.   Assessment & Plan:  Principal Problem:   Acute respiratory failure with hypoxia (HCC) Active Problems:   Type 2 diabetes mellitus with hyperlipidemia (HCC)   Iron deficiency anemia due to chronic blood loss   Essential hypertension   GERD   Acute kidney injury superimposed on CKD ([place])   Pneumonia due to COVID-19 virus   CKD (chronic kidney disease) stage 5, GFR less than 15 ml/min (HCC)   Severe pulmonary hypertension (HCC)   Moderate aortic regurgitation   CAD in native artery   Protein-calorie malnutrition, severe   ESRD on dialysis ([place])   Acute kidney injury complicating stage V CKD, possibly new ESRD with volume overload/metabolic acidosis: S/p LUE AVG placed 2/27.  Nephrology consultation appreciated.  Now likely progressed to ESRD and early uremia.  Renal ultrasound without hydronephrosis.  Had right temporary HD catheter placed and initiated HD 3/5.  Due to bleeding complications from temporary HD catheter (slice in it), this was removed and TDC placed by IR 3/7.  Hyperkalemia and  metabolic acidosis resolved.  Will need OP HD slot/clip.  Nephrology following. S/p HD at bedside 3/9.  Next HD 3/12.  Per nephrology follow-up, for now MWF schedule.  COVID 19 acute bronchitis/acute respiratory failure with hypoxia: Required [place] oxygen initially but now has been weaned off to room air.  Hypoxia resolved.  Remains on IV Decadron 6 mg daily.  On admission, did not meet criteria for Actemra.  Inflammatory markers including D-dimer elevated.  D-dimer had improved but gradually creeping up, unclear etiology.  Was briefly on IV heparin infusion since admission until 3/7 based on earlier pharmacy recommendations given positive COVID, low oxygen needs and elevated D-dimer.  On 3/7, given that patient had low index of suspicion for PE to begin with, negative lower extremity venous Dopplers, decreasing D-dimer, bleeding from HD catheter site, and pharmacy recommendations, discontinued anticoagulation and changed to prophylactic dose.  Acute metabolic encephalopathy: Multifactorial due to ESRD with uremia, COVID-19.  Resolved.  Anemia due to ESRD: Hemoglobin up to 8.3.  Nephrology has started iron and ESA with HD.  Hemoglobin has dropped to 7.7 in the absence of overt bleeding.  Follow CBC at dialysis tomorrow and transfuse if hemoglobin 7 g or less.  Follow daily CBCs.  Type II DM with renal complications: 123456 7.4.  Mildly uncontrolled and fluctuating.  Titrate insulins as needed, may need to increase, on steroids.  SSI was increased from very sensitive to moderate scale with improved control.  DM coordinator input appreciated and initiated NovoLog mealtime insulin 3 units.  Reasonably controlled today.  Hypertension Continue amlodipine, clonidine, doxazosin, BiDil.  Volume management across HD.  Controlled.  Dyslipidemia Continue statins.  Chronic combined CHF/severe pulmonary hypertension/moderate aortic regurgitation: Cardiac meds as above.  Volume management across HD.  Appears  clinically euvolemic.  CAD No angina.  Continue clopidogrel and statins.  Reported OSA Unclear if patient on CPAP prior to admission-will have to verify with him.  Prior history of GI bleeding Asymptomatic.  Constipation: As per nursing, had a big BM on 3/10.  Continue aggressive bowel regimen.  Patient declined suppository yesterday.  Insomnia States that melatonin does not work for him at home and neither did work here last night.  Trial of trazodone.  Body mass index is 27.35 kg/m.  Nutrition Status: Nutrition Problem: Moderate Malnutrition Etiology: poor appetite, chronic illness Signs/Symptoms: energy intake < or equal to 75% for > or equal to 1 month, mild fat depletion, moderate muscle depletion Interventions: MVI, Nepro shake, Liberalize Diet, Refer to RD note for recommendations, Education    DVT prophylaxis: heparin injection 5,000 Units Start: 10/27/22 1500  Added subcutaneous heparin   Code Status: Full Code:  ACP Documents: None present Family Communication: None at bedside Disposition:  Status is: Inpatient Remains inpatient appropriate because: Will need need further inpatient dialysis and outpatient HD placement.     Consultants:   Nephrology Interventional radiology  Procedures:   Placement of right temporary HD catheter Placement of right TDC by IR 3/7.  Antimicrobials:   Hemodialysis 3/5, 3/7, 3/9   Subjective:  Seen this morning.  Sitting up in reclining chair.  First time I have seen him out of bed to chair but he has been up to chair prior per his report.  States that melatonin does not help him and he did not sleep well last night either.  Objective:   Vitals:   10/30/22 1601 10/30/22 2000 10/31/22 0300 10/31/22 0753  BP: (!) 160/60 (!) 165/56 (!) 143/51 (!) 158/62  Pulse: 98 84 81 80  Resp:  '18 18 16  '$ Temp: 98.2 F (36.8 C) 98.6 F (37 C) 98.8 F (37.1 C) 98.7 F (37.1 C)  TempSrc:  Oral Oral Oral  SpO2: 98% 96% 100% 98%   Weight:   79.2 kg   Height:        General exam: Elderly male, moderately built and nourished, looks younger than stated age, sitting up comfortably in chair. Respiratory system: Clear to auscultation.  No increased work of breathing.  Right infraclavicular TDC site without acute findings.  Right base of neck prior temporary HD catheter site dressing clean and dry without bleeding. Cardiovascular system: S1 & S2 heard, RRR. No JVD, murmurs, rubs, gallops or clicks.  No pedal edema.  Off of telemetry. Gastrointestinal system: Abdomen is nondistended, soft and nontender. No organomegaly or masses felt. Normal bowel sounds heard. Central nervous system: Alert and oriented. No focal neurological deficits. Extremities: Symmetric 5 x 5 power.  Left upper arm AVF thrill well-appreciated, site appears healed. Skin: No rashes, lesions or ulcers Psychiatry: Judgement and insight appear normal. Mood & affect appropriate.     Data Reviewed:   I have personally reviewed following labs and imaging studies   CBC: Recent Labs  Lab 10/25/22 0555 10/25/22 0651 10/29/22 0511 10/30/22 0445 10/31/22 0619  WBC 7.4   < > 6.6 9.5 12.5*  NEUTROABS 6.8  --   --   --   --   HGB 8.7*   < > 7.5* 8.3* 7.7*  HCT 25.6*   < >  22.1* 24.7* 22.7*  MCV 82.3   < > 80.7 82.1 81.4  PLT 325   < > 154 203 249   < > = values in this interval not displayed.    Basic Metabolic Panel: Recent Labs  Lab 10/25/22 0645 10/25/22 0651 10/25/22 0927 10/26/22 0625 10/27/22 1241 10/29/22 0510  NA 142 141 145 141 132* 130*  K 3.7 6.5* 3.5 3.5 4.0 3.9  CL 110 112* 111 104 97* 94*  CO2 18*  --  13* 24 21* 24  GLUCOSE 167* 159* 166* 173* 268* 146*  BUN 147* >130* 153* 100* 120* 96*  CREATININE 7.45* 8.90* 7.61*  7.71* 5.49* 6.75* 5.52*  CALCIUM 8.6*  --  8.7* 8.4* 8.2* 8.0*  PHOS  --   --   --   --  7.2* 5.8*    Liver Function Tests: Recent Labs  Lab 10/25/22 0645 10/27/22 1241 10/29/22 0510  AST 26  --   --    ALT 6  --   --   ALKPHOS 42  --   --   BILITOT 0.6  --   --   PROT 6.5  --   --   ALBUMIN 2.9* 2.5* 2.5*    CBG: Recent Labs  Lab 10/30/22 1600 10/30/22 2256 10/31/22 0754  GLUCAP 280* 189* 169*    Microbiology Studies:   Recent Results (from the past 240 hour(s))  Resp panel by RT-PCR (RSV, Flu A&B, Covid) Anterior Nasal Swab     Status: Abnormal   Collection Time: 10/25/22  5:46 AM   Specimen: Anterior Nasal Swab  Result Value Ref Range Status   SARS Coronavirus 2 by RT PCR POSITIVE (A) NEGATIVE Final   Influenza A by PCR NEGATIVE NEGATIVE Final   Influenza B by PCR NEGATIVE NEGATIVE Final    Comment: (NOTE) The Xpert Xpress SARS-CoV-2/FLU/RSV plus assay is intended as an aid in the diagnosis of influenza from Nasopharyngeal swab specimens and should not be used as a sole basis for treatment. Nasal washings and aspirates are unacceptable for Xpert Xpress SARS-CoV-2/FLU/RSV testing.  Fact Sheet for Patients: EntrepreneurPulse.com.au  Fact Sheet for Healthcare Providers: IncredibleEmployment.be  This test is not yet approved or cleared by the [place] FDA and has been authorized for detection and/or diagnosis of SARS-CoV-2 by FDA under an Emergency Use Authorization (EUA). This EUA will remain in effect (meaning this test can be used) for the duration of the COVID-19 declaration under Section 564(b)(1) of the Act, 21 U.S.C. section 360bbb-3(b)(1), unless the authorization is terminated or revoked.     Resp Syncytial Virus by PCR NEGATIVE NEGATIVE Final    Comment: (NOTE) Fact Sheet for Patients: EntrepreneurPulse.com.au  Fact Sheet for Healthcare Providers: IncredibleEmployment.be  This test is not yet approved or cleared by the [place] FDA and has been authorized for detection and/or diagnosis of SARS-CoV-2 by FDA under an Emergency Use Authorization (EUA). This EUA will  remain in effect (meaning this test can be used) for the duration of the COVID-19 declaration under Section 564(b)(1) of the Act, 21 U.S.C. section 360bbb-3(b)(1), unless the authorization is terminated or revoked.  Performed at [facility], [place] [address] [place], [place] [address]   Blood Culture (routine x 2)     Status: None   Collection Time: 10/25/22  6:10 AM   Specimen: BLOOD RIGHT HAND  Result Value Ref Range Status   Specimen Description BLOOD RIGHT HAND  Final   Special Requests   Final  BOTTLES DRAWN AEROBIC AND ANAEROBIC Blood Culture adequate volume   Culture   Final    NO GROWTH 5 DAYS Performed at [facility], [place] [address], [place], [place] [address]    Report Status 10/30/2022 FINAL  Final  Blood Culture (routine x 2)     Status: None   Collection Time: 10/25/22  6:15 AM   Specimen: BLOOD RIGHT HAND  Result Value Ref Range Status   Specimen Description BLOOD RIGHT HAND  Final   Special Requests   Final    BOTTLES DRAWN AEROBIC AND ANAEROBIC Blood Culture results may not be optimal due to an inadequate volume of blood received in culture bottles   Culture   Final    NO GROWTH 5 DAYS Performed at [facility], [place] [address] [place], [place] [address]    Report Status 10/30/2022 FINAL  Final    Radiology Studies:  No results found.  Scheduled Meds:    amLODipine  10 mg Oral Daily   atorvastatin  20 mg Oral Daily   bisacodyl  10 mg Rectal Once   calcium acetate  667 mg Oral TID WC   Chlorhexidine Gluconate Cloth  6 each Topical Q0600   Chlorhexidine Gluconate Cloth  6 each Topical Q0600   cholecalciferol  1,000 Units Oral Daily   cloNIDine  0.3 mg Oral BID   clopidogrel  75 mg Oral Daily   darbepoetin (ARANESP) injection - DIALYSIS  100 mcg Subcutaneous Q Thu-1800   dexamethasone (DECADRON) injection  6 mg Intravenous Q24H   doxazosin  2 mg Oral QHS   feeding supplement (NEPRO CARB STEADY)  237 mL Oral BID BM   folic acid  1  mg Oral Daily   heparin injection (subcutaneous)  5,000 Units Subcutaneous Q8H   insulin aspart  0-15 Units Subcutaneous TID WC   insulin aspart  0-5 Units Subcutaneous QHS   insulin aspart  3 Units Subcutaneous TID WC   isosorbide-hydrALAZINE  1 tablet Oral TID   melatonin  3 mg Oral QHS   multivitamin  1 tablet Oral QHS   pantoprazole  40 mg Oral Daily   polyethylene glycol  17 g Oral Daily   senna-docusate  1 tablet Oral BID   sodium chloride flush  3 mL Intravenous Q12H    Continuous Infusions:    anticoagulant sodium citrate     [START ON 11/01/2022] ferric gluconate (FERRLECIT) IVPB        LOS: 6 days     Vernell Leep, MD,  FACP, FHM, SFHM, [facility], [facility]   [facility] [place]     To contact the attending provider between 7A-7P or the covering provider during after hours 7P-7A, please log into the web site [email or web address] and access using universal [place] password for that web site. If you do not have the password, please call the hospital operator.  10/31/2022, 9:48 AM

## 2022-10-31 NOTE — Progress Notes (Signed)
Physical Therapy Treatment Patient Details Name: Austin [name] MRN: [id number] DOB: Aug 02, 1935 Today's Date: 10/31/2022   History of Present Illness Pt is 88 year old presented to [facility] on 10/25/22 with acute hypoxic respiratory failure due to acute Covid. Pt with continued progression of ckd and temporary HD cath placed and HD intitiated. PMH: ckd with impending HD, diastolic CHF, DM2 HTN, HLD, prostate CA hx    PT Comments    Pt admitted with above diagnosis. Pt was able to ambulate with RW with min guard assist overall with cues for safety at times. Pt is making progress.  No LOB with use of RW.  Pt currently with functional limitations due to balance and endurance deficits. Pt will benefit from skilled PT to increase their independence and safety with mobility to allow discharge to the venue listed below.     Recommendations for follow up therapy are one component of a multi-disciplinary discharge planning process, led by the attending physician.  Recommendations may be updated based on patient status, additional functional criteria and insurance authorization.  Follow Up Recommendations  Skilled nursing-short term rehab (<3 hours/day) Can patient physically be transported by private vehicle: Yes   Assistance Recommended at Discharge Frequent or constant Supervision/Assistance  Patient can return home with the following A little help with walking and/or transfers;A little help with bathing/dressing/bathroom;Assistance with cooking/housework;Help with stairs or ramp for entrance;Assist for transportation   Equipment Recommendations  Wheelchair cushion (measurements PT);Wheelchair (measurements PT)    Recommendations for Other Services       Precautions / Restrictions Precautions Precautions: Fall Restrictions Weight Bearing Restrictions: No     Mobility  Bed Mobility               General bed mobility comments: Pt in chair on arrival    Transfers Overall transfer level:  Needs assistance Equipment used: Rolling walker (2 wheels) Transfers: Sit to/from Stand Sit to Stand: Min guard           General transfer comment: incr time to  power up but pt didnt need physical assist. Verbal cues for hand placement    Ambulation/Gait Ambulation/Gait assistance: Min guard, Supervision Gait Distance (Feet): 150 Feet Assistive device: Rolling walker (2 wheels) Gait Pattern/deviations: Step-through pattern, Decreased step length - right, Decreased step length - left, Decreased stride length, Trunk flexed Gait velocity: decr Gait velocity interpretation: <1.31 ft/sec, indicative of household ambulator   General Gait Details: Assist for support and stability.  Pt does demonstrate improved safety awareness.   Stairs             Wheelchair Mobility    Modified Rankin (Stroke Patients Only)       Balance Overall balance assessment: Needs assistance Sitting-balance support: No upper extremity supported, Feet supported Sitting balance-Leahy Scale: Good     Standing balance support: Bilateral upper extremity supported, During functional activity Standing balance-Leahy Scale: Poor Standing balance comment: walker and min guard for static standing                            Cognition Arousal/Alertness: Awake/alert Behavior During Therapy: Flat affect Overall Cognitive Status: Within Functional Limits for tasks assessed                                          Exercises General Exercises - Lower Extremity Ankle Circles/Pumps: AROM,  Both, 10 reps, Seated Long Arc Quad: AROM, Both, 10 reps, Seated Hip Flexion/Marching: AROM, Both, 10 reps, Seated    General Comments General comments (skin integrity, edema, etc.): VSS on RA      Pertinent Vitals/Pain Pain Assessment Pain Assessment: No/denies pain    Home Living                          Prior Function            PT Goals (current goals can now be  found in the care plan section) Acute Rehab PT Goals Patient Stated Goal: return home Progress towards PT goals: Progressing toward goals    Frequency    Min 3X/week      PT Plan Current plan remains appropriate    Co-evaluation              AM-PAC PT "6 Clicks" Mobility   Outcome Measure  Help needed turning from your back to your side while in a flat bed without using bedrails?: A Little Help needed moving from lying on your back to sitting on the side of a flat bed without using bedrails?: A Little Help needed moving to and from a bed to a chair (including a wheelchair)?: A Little Help needed standing up from a chair using your arms (e.g., wheelchair or bedside chair)?: A Little Help needed to walk in hospital room?: A Little Help needed climbing 3-5 steps with a railing? : A Lot 6 Click Score: 17    End of Session Equipment Utilized During Treatment: Gait belt Activity Tolerance: Patient limited by fatigue Patient left: in chair;with call bell/phone within reach;with chair alarm set Nurse Communication: Mobility status PT Visit Diagnosis: Other abnormalities of gait and mobility (R26.89);Muscle weakness (generalized) (M62.81)     Time: 0940-1000 PT Time Calculation (min) (ACUTE ONLY): 20 min  Charges:  $Gait Training: 8-22 mins                     Cristie Mckinney M,PT Acute Rehab Services [phone]    Alvira Philips 10/31/2022, 3:08 PM

## 2022-10-31 NOTE — Progress Notes (Signed)
[facility] Progress Note   Assessment/ Plan:    AKI on Advanced CKD-->ESRD: - uremic on admission-   had a graft placed on 10/18/22 - renal US neg for obstruction - HD #1 10/25/22 - had tempcath- had a slice in the catheter- not a pinhole- see images under media tab- IR quickly placed [facility]- greatly appreciate assistance - HD #2 10/27/22 - HD #3 10/29/22 - may have spot at [facility], may depend on SNF placement though - next HD Tuesday-  tomorrow -  may be able to use the AVG by the end of the month-  right now maybe OP schedule is MWF-  will modify inpt schedule as needed   2.  Acute encephalopathy:             - likely multifactorial- uremia, COVID etc             - improving     3. COVID +             - got CAP coverage             - on dex  - dopplers negative for DVT  - s/p hep gtt   4.  Anemia:             - iron panel with Tsat 15%- started iron with HD             - ESA started in HD   5.  BMM:             - PTH has not been checked-  phos high and calcium   6.  Dispo: admitted, CLIP in process, may need SNF  7. HTN/volume-  BP a little high -  norvasc 10/cardura 2, clonidine 0.2 BID-  no change for now-  UF as able with HD -  sodium is going down ? Unclear why  Subjective:   No new c/o's-   sitting up in bedside chair eating breakfast.  Thinks he will be going to an SNF    Objective:   BP (!) 158/62 (BP Location: Right Arm)   Pulse 80   Temp 98.7 F (37.1 C) (Oral)   Resp 16   Ht '5\' 7"'$  (1.702 m)   Wt 79.2 kg   SpO2 98%   BMI 27.35 kg/m   Physical Exam: GEN awake and alert- up in chair-  slow speaking but looks younger than age HEENT EOMI PERRL NECK NO JVD PULM clear anteriorly CV RRR no rubs, soft murmur ABD soft, nontender NABS EXT min pedal edema NEURO awake and alert SKIN warm and dry ACCESS: R IJ TDC-  also has left arm AVG-  great thrill and bruit  Labs: BMET Recent Labs  Lab 10/25/22 0645 10/25/22 0651 10/25/22 0927  10/26/22 0625 10/27/22 1241 10/29/22 0510  NA 142 141 145 141 132* 130*  K 3.7 6.5* 3.5 3.5 4.0 3.9  CL 110 112* 111 104 97* 94*  CO2 18*  --  13* 24 21* 24  GLUCOSE 167* 159* 166* 173* 268* 146*  BUN 147* >130* 153* 100* 120* 96*  CREATININE 7.45* 8.90* 7.61*  7.71* 5.49* 6.75* 5.52*  CALCIUM 8.6*  --  8.7* 8.4* 8.2* 8.0*  PHOS  --   --   --   --  7.2* 5.8*   CBC Recent Labs  Lab 10/25/22 0555 10/25/22 0651 10/28/22 0039 10/29/22 0511 10/30/22 0445 10/31/22 0619  WBC 7.4   < > 6.6 6.6  9.5 12.5*  NEUTROABS 6.8  --   --   --   --   --   HGB 8.7*   < > 7.9* 7.5* 8.3* 7.7*  HCT 25.6*   < > 22.7* 22.1* 24.7* 22.7*  MCV 82.3   < > 79.4* 80.7 82.1 81.4  PLT 325   < > 180 154 203 249   < > = values in this interval not displayed.      Medications:     amLODipine  10 mg Oral Daily   atorvastatin  20 mg Oral Daily   bisacodyl  10 mg Rectal Once   Chlorhexidine Gluconate Cloth  6 each Topical Q0600   cholecalciferol  1,000 Units Oral Daily   cloNIDine  0.3 mg Oral BID   clopidogrel  75 mg Oral Daily   darbepoetin (ARANESP) injection - DIALYSIS  100 mcg Subcutaneous Q Thu-1800   dexamethasone (DECADRON) injection  6 mg Intravenous Q24H   doxazosin  2 mg Oral QHS   feeding supplement (NEPRO CARB STEADY)  237 mL Oral BID BM   folic acid  1 mg Oral Daily   heparin injection (subcutaneous)  5,000 Units Subcutaneous Q8H   insulin aspart  0-15 Units Subcutaneous TID WC   insulin aspart  0-5 Units Subcutaneous QHS   insulin aspart  3 Units Subcutaneous TID WC   isosorbide-hydrALAZINE  1 tablet Oral TID   melatonin  3 mg Oral QHS   multivitamin  1 tablet Oral QHS   pantoprazole  40 mg Oral Daily   polyethylene glycol  17 g Oral Daily   senna-docusate  1 tablet Oral BID   sodium chloride flush  3 mL Intravenous Q12H     Austin [name]  10/31/2022, 9:17 AM

## 2022-10-31 NOTE — Progress Notes (Signed)
Mobility Specialist - Progress Note   10/31/22 1520  Mobility  Activity Ambulated independently in hallway  Level of Assistance Standby assist, set-up cues, supervision of patient - no hands on  Assistive Device Front wheel walker  Distance Ambulated (ft) 100 ft  Activity Response Tolerated well  Mobility Referral Yes  $Mobility charge 1 Mobility   Pt was received in bed and agreeable to mobility. No complaints throughout session. Pt was returned to bed with all needs met.   Franki Monte  Mobility Specialist Please contact via Solicitor or Rehab office at [phone]

## 2022-11-01 DIAGNOSIS — D631 Anemia in chronic kidney disease: Secondary | ICD-10-CM

## 2022-11-01 DIAGNOSIS — Z87891 Personal history of nicotine dependence: Secondary | ICD-10-CM | POA: Insufficient documentation

## 2022-11-01 DIAGNOSIS — Z992 Dependence on renal dialysis: Secondary | ICD-10-CM | POA: Diagnosis not present

## 2022-11-01 DIAGNOSIS — N186 End stage renal disease: Secondary | ICD-10-CM | POA: Diagnosis not present

## 2022-11-01 LAB — D-DIMER, QUANTITATIVE: D-Dimer, Quant: 3.53 ug/mL-FEU — ABNORMAL HIGH (ref 0.00–0.50)

## 2022-11-01 LAB — GLUCOSE, CAPILLARY
Glucose-Capillary: 117 mg/dL — ABNORMAL HIGH (ref 70–99)
Glucose-Capillary: 124 mg/dL — ABNORMAL HIGH (ref 70–99)
Glucose-Capillary: 126 mg/dL — ABNORMAL HIGH (ref 70–99)
Glucose-Capillary: 174 mg/dL — ABNORMAL HIGH (ref 70–99)

## 2022-11-01 LAB — CBC
HCT: 20.9 % — ABNORMAL LOW (ref 39.0–52.0)
Hemoglobin: 7 g/dL — ABNORMAL LOW (ref 13.0–17.0)
MCH: 27.3 pg (ref 26.0–34.0)
MCHC: 33.5 g/dL (ref 30.0–36.0)
MCV: 81.6 fL (ref 80.0–100.0)
Platelets: 257 10*3/uL (ref 150–400)
RBC: 2.56 MIL/uL — ABNORMAL LOW (ref 4.22–5.81)
RDW: 13.9 % (ref 11.5–15.5)
WBC: 12.8 10*3/uL — ABNORMAL HIGH (ref 4.0–10.5)
nRBC: 1.2 % — ABNORMAL HIGH (ref 0.0–0.2)

## 2022-11-01 LAB — RENAL FUNCTION PANEL
Albumin: 2.5 g/dL — ABNORMAL LOW (ref 3.5–5.0)
Anion gap: 15 (ref 5–15)
BUN: 98 mg/dL — ABNORMAL HIGH (ref 8–23)
CO2: 25 mmol/L (ref 22–32)
Calcium: 8.7 mg/dL — ABNORMAL LOW (ref 8.9–10.3)
Chloride: 92 mmol/L — ABNORMAL LOW (ref 98–111)
Creatinine, Ser: 5.97 mg/dL — ABNORMAL HIGH (ref 0.61–1.24)
GFR, Estimated: 9 mL/min — ABNORMAL LOW (ref >60)
Glucose, Bld: 129 mg/dL — ABNORMAL HIGH (ref 70–99)
Phosphorus: 5.1 mg/dL — ABNORMAL HIGH (ref 2.5–4.6)
Potassium: 4.2 mmol/L (ref 3.5–5.1)
Sodium: 132 mmol/L — ABNORMAL LOW (ref 135–145)

## 2022-11-01 LAB — C-REACTIVE PROTEIN: CRP: 1.1 mg/dL — ABNORMAL HIGH (ref <1.0)

## 2022-11-01 MED ORDER — CLONIDINE HCL 0.1 MG PO TABS
0.2000 mg | ORAL_TABLET | Freq: Two times a day (BID) | ORAL | Status: DC
  Administered 2022-11-01 – 2022-11-02 (×3): 0.2 mg via ORAL
  Filled 2022-11-01 (×3): qty 2

## 2022-11-01 MED ORDER — AMLODIPINE BESYLATE 10 MG PO TABS
10.0000 mg | ORAL_TABLET | Freq: Every day | ORAL | Status: DC
  Administered 2022-11-01 – 2022-11-04 (×4): 10 mg via ORAL
  Filled 2022-11-01 (×4): qty 1

## 2022-11-01 NOTE — Progress Notes (Signed)
Mobility Specialist - Progress Note   11/01/22 1016  Mobility  Activity Ambulated with assistance in hallway  Level of Assistance Standby assist, set-up cues, supervision of patient - no hands on  Assistive Device Front wheel walker  Distance Ambulated (ft) 150 ft  Activity Response Tolerated well  Mobility Referral Yes  $Mobility charge 1 Mobility   Pt was received in bed and agreeable to mobility. Session limited d/t fatigue. Pt was left in chair with all needs met.   Franki Monte  Mobility Specialist Please contact via Solicitor or Rehab office at [phone]

## 2022-11-01 NOTE — Progress Notes (Signed)
PROGRESS NOTE   Austin [name]  [id number]    DOB: July 12, 1935    DOA: 10/25/2022  PCP: Dorothyann Peng, NP   I have briefly reviewed patients previous medical records in [facility].  Chief Complaint  Patient presents with   Fever    Brief Narrative:  88 year old married male, independent, medical history significant for type II DM, HTN, CAD, prior GI bleed, CHF, sleep apnea, stage V CKD, recent placement of left upper extremity AV fistula on 10/18/2022, presented to the ED on 10/25/2022 with complaints of generalized weakness, not acting like himself, EMS noted fever of 102 F and oxygen saturation in the low 90s.  He was admitted for acute respiratory failure with hypoxia, COVID-19 acute bronchitis, acute kidney injury complicating stage V CKD with early uremia.  Nephrology was consulted, right temporary HD catheter (switched to [facility] on 3/7) was placed and started HD on 2/5.  Improving.  Awaiting SNF placement following which renal navigator will assist with outpatient HD arrangements.  TOC on board.   Assessment & Plan:  Principal Problem:   Acute respiratory failure with hypoxia (HCC) Active Problems:   Type 2 diabetes mellitus with hyperlipidemia (HCC)   Iron deficiency anemia due to chronic blood loss   Essential hypertension   GERD   Acute kidney injury superimposed on CKD ([place])   Pneumonia due to COVID-19 virus   CKD (chronic kidney disease) stage 5, GFR less than 15 ml/min (HCC)   Severe pulmonary hypertension (HCC)   Moderate aortic regurgitation   CAD in native artery   Protein-calorie malnutrition, severe   ESRD on dialysis ([place])   Acute kidney injury complicating stage V CKD, possibly new ESRD with volume overload/metabolic acidosis: S/p LUE AVG placed 2/27.  Nephrology following.  Now likely new ESRD and early uremia.  Renal ultrasound without hydronephrosis.  Had right temporary HD catheter placed and initiated HD 3/5.  Due to bleeding complications from temporary HD  catheter (slice in it), this was removed and TDC placed by IR 3/7.  Hyperkalemia and metabolic acidosis resolved.  Nephrology following. S/p HD at bedside 3/9 & 3/12.  Eventually may be an MWF OP schedule. Awaiting SNF placement following which renal navigator will assist with outpatient HD arrangements.  TOC on board.  Also need to consider if SNF wants patient to complete 10 days of COVID-19 isolation prior to DC.  Tested COVID-positive on 10/25/2022.  COVID 19 acute bronchitis/acute respiratory failure with hypoxia: Hypoxia resolved.  Remains on IV Decadron 6 mg daily (day 8/10).  On admission, did not meet criteria for Actemra.  Inflammatory markers including D-dimer elevated.  D-dimer had come down to 0.9 but increased and peaked to 3.86, starting to decrease again. Was briefly on IV heparin infusion since admission until 3/7 based on earlier pharmacy recommendations given positive COVID, low oxygen needs and elevated D-dimer.  On 3/7, given that patient had low index of suspicion for PE to begin with, negative lower extremity venous Dopplers, decreasing D-dimer, bleeding from HD catheter site, and pharmacy recommendations, discontinued anticoagulation and changed to prophylactic dose.  Acute metabolic encephalopathy: Multifactorial due to ESRD with uremia, COVID-19.  Resolved.  Anemia due to ESRD: Hemoglobin up to 8.3.  Nephrology has started iron and ESA with HD.  Hemoglobin was mostly in the mid 7 g range but has decreased to 7 appears to have already completed HD today.  Recheck hemoglobin in a.m. and if hemoglobin still at 7 or lower than transfuse a unit of PRBC.  Patient asymptomatic.  Type II DM with renal complications: [id number] 7.4.  Mildly uncontrolled and fluctuating.  Continue moderate sensitivity SSI and mealtime NovoLog 3 units.  CBGs mostly controlled but at times in the low 200s.  Hypertension On amlodipine, clonidine, doxazosin, BiDil.  Volume management across HD.  Blood pressures  decreasing and nephrology titrating down clonidine with eventual plan to discontinue.  Dyslipidemia Continue statins.  Chronic combined CHF/severe pulmonary hypertension/moderate aortic regurgitation: Cardiac meds as above.  Volume management across HD.  Appears clinically euvolemic.  CAD No angina.  Continue clopidogrel and statins.  Reported OSA Unclear if patient on CPAP prior to admission-will have to verify with him.  Prior history of GI bleeding Asymptomatic.  Constipation: As per nursing, had a big BM on 3/10.  Continue aggressive bowel regimen.   Insomnia States that melatonin does not work for him at home and neither did work here last night.  Trial of trazodone.  Reports that he slept for 4 to 5 hours at night last night but also indicated that he had been taking several naps in the day.  Advised him to minimize daytime sleeping/naps to avoid nighttime insomnia.  Body mass index is 26.31 kg/m.  Nutrition Status: Nutrition Problem: Moderate Malnutrition Etiology: poor appetite, chronic illness Signs/Symptoms: energy intake < or equal to 75% for > or equal to 1 month, mild fat depletion, moderate muscle depletion Interventions: MVI, Nepro shake, Liberalize Diet, Refer to RD note for recommendations, Education    DVT prophylaxis: heparin injection 5,000 Units Start: 10/27/22 1500  Added subcutaneous heparin   Code Status: Full Code:  ACP Documents: None present Family Communication: None at bedside Disposition:   Awaiting SNF placement following which renal navigator will assist with outpatient HD arrangements.  TOC on board.  I suspect he could be discharged to SNF if he gets a bed provided COVID-related isolation is not an issue.   Consultants:   Nephrology Interventional radiology  Procedures:   Placement of right temporary HD catheter Placement of right TDC by IR 3/7.  Antimicrobials:   Hemodialysis 3/5, 3/7, 3/9, 3/12   Subjective:  No BM yesterday.   Slept 4 to 5 hours last night but also reported significant daytime napping.  No other complaints.  Objective:   Vitals:   11/01/22 1030 11/01/22 1045 11/01/22 1100 11/01/22 1113  BP: (!) 138/48 131/82 136/79 (!) 150/50  Pulse: (!) 55 (!) 55 (!) 56 (!) 57  Resp: '19 13 14 12  '$ Temp:    98.4 F (36.9 C)  TempSrc:    Oral  SpO2: 100% 100% 99% 99%  Weight:    76.2 kg  Height:        General exam: Elderly male, moderately built and nourished, looks younger than stated age, lying comfortably in bed undergoing bedside hemodialysis. Respiratory system: Clear to auscultation.  No increased work of breathing.  Right infraclavicular TDC site without acute findings.  Right base of neck prior temporary HD catheter site dressing clean and dry without bleeding.  Stable without change. Cardiovascular system: S1 & S2 heard, RRR. No JVD, murmurs, rubs, gallops or clicks.  No pedal edema.  Off of telemetry.  Stable without change. Gastrointestinal system: Abdomen is nondistended, soft and nontender. No organomegaly or masses felt. Normal bowel sounds heard. Central nervous system: Alert and oriented. No focal neurological deficits. Extremities: Symmetric 5 x 5 power.  Left upper arm AVF thrill well-appreciated, site appears healed. Skin: No rashes, lesions or ulcers Psychiatry: Judgement and insight appear  normal. Mood & affect appropriate.     Data Reviewed:   I have personally reviewed following labs and imaging studies   CBC: Recent Labs  Lab 10/30/22 0445 10/31/22 0619 11/01/22 0216  WBC 9.5 12.5* 12.8*  HGB 8.3* 7.7* 7.0*  HCT 24.7* 22.7* 20.9*  MCV 82.1 81.4 81.6  PLT 203 249 [id number]    Basic Metabolic Panel: Recent Labs  Lab 10/26/22 0625 10/27/22 1241 10/29/22 0510 11/01/22 0216  NA 141 132* 130* 132*  K 3.5 4.0 3.9 4.2  CL 104 97* 94* 92*  CO2 24 21* 24 25  GLUCOSE 173* 268* 146* 129*  BUN 100* 120* 96* 98*  CREATININE 5.49* 6.75* 5.52* 5.97*  CALCIUM 8.4* 8.2* 8.0* 8.7*   PHOS  --  7.2* 5.8* 5.1*    Liver Function Tests: Recent Labs  Lab 10/27/22 1241 10/29/22 0510 11/01/22 0216  ALBUMIN 2.5* 2.5* 2.5*    CBG: Recent Labs  Lab 11/01/22 0438 11/01/22 0654 11/01/22 0718  GLUCAP 117* 126* 124*    Microbiology Studies:   Recent Results (from the past 240 hour(s))  Resp panel by RT-PCR (RSV, Flu A&B, Covid) Anterior Nasal Swab     Status: Abnormal   Collection Time: 10/25/22  5:46 AM   Specimen: Anterior Nasal Swab  Result Value Ref Range Status   SARS Coronavirus 2 by RT PCR POSITIVE (A) NEGATIVE Final   Influenza A by PCR NEGATIVE NEGATIVE Final   Influenza B by PCR NEGATIVE NEGATIVE Final    Comment: (NOTE) The Xpert Xpress SARS-CoV-2/FLU/RSV plus assay is intended as an aid in the diagnosis of influenza from Nasopharyngeal swab specimens and should not be used as a sole basis for treatment. Nasal washings and aspirates are unacceptable for Xpert Xpress SARS-CoV-2/FLU/RSV testing.  Fact Sheet for Patients: EntrepreneurPulse.com.au  Fact Sheet for Healthcare Providers: IncredibleEmployment.be  This test is not yet approved or cleared by the [place] FDA and has been authorized for detection and/or diagnosis of SARS-CoV-2 by FDA under an Emergency Use Authorization (EUA). This EUA will remain in effect (meaning this test can be used) for the duration of the COVID-19 declaration under Section 564(b)(1) of the Act, 21 U.S.C. section 360bbb-3(b)(1), unless the authorization is terminated or revoked.     Resp Syncytial Virus by PCR NEGATIVE NEGATIVE Final    Comment: (NOTE) Fact Sheet for Patients: EntrepreneurPulse.com.au  Fact Sheet for Healthcare Providers: IncredibleEmployment.be  This test is not yet approved or cleared by the [place] FDA and has been authorized for detection and/or diagnosis of SARS-CoV-2 by FDA under an Emergency Use  Authorization (EUA). This EUA will remain in effect (meaning this test can be used) for the duration of the COVID-19 declaration under Section 564(b)(1) of the Act, 21 U.S.C. section 360bbb-3(b)(1), unless the authorization is terminated or revoked.  Performed at [facility], [place] [address] [place], [place] [address]   Blood Culture (routine x 2)     Status: None   Collection Time: 10/25/22  6:10 AM   Specimen: BLOOD RIGHT HAND  Result Value Ref Range Status   Specimen Description BLOOD RIGHT HAND  Final   Special Requests   Final    BOTTLES DRAWN AEROBIC AND ANAEROBIC Blood Culture adequate volume   Culture   Final    NO GROWTH 5 DAYS Performed at [facility], [place] [address], [place], [place] [address]    Report Status 10/30/2022 FINAL  Final  Blood Culture (routine x 2)  Status: None   Collection Time: 10/25/22  6:15 AM   Specimen: BLOOD RIGHT HAND  Result Value Ref Range Status   Specimen Description BLOOD RIGHT HAND  Final   Special Requests   Final    BOTTLES DRAWN AEROBIC AND ANAEROBIC Blood Culture results may not be optimal due to an inadequate volume of blood received in culture bottles   Culture   Final    NO GROWTH 5 DAYS Performed at [facility], [place] [address], [place], [place] [address]    Report Status 10/30/2022 FINAL  Final    Radiology Studies:  No results found.  Scheduled Meds:    amLODipine  10 mg Oral QHS   atorvastatin  20 mg Oral Daily   bisacodyl  10 mg Rectal Once   calcium acetate  667 mg Oral TID WC   Chlorhexidine Gluconate Cloth  6 each Topical Q0600   Chlorhexidine Gluconate Cloth  6 each Topical Q0600   cholecalciferol  1,000 Units Oral Daily   cloNIDine  0.2 mg Oral BID   clopidogrel  75 mg Oral Daily   darbepoetin (ARANESP) injection - DIALYSIS  100 mcg Subcutaneous Q Thu-1800   dexamethasone (DECADRON) injection  6 mg Intravenous Q24H   doxazosin  2 mg Oral QHS   feeding supplement (NEPRO CARB STEADY)  237  mL Oral BID BM   folic acid  1 mg Oral Daily   heparin injection (subcutaneous)  5,000 Units Subcutaneous Q8H   insulin aspart  0-15 Units Subcutaneous TID WC   insulin aspart  0-5 Units Subcutaneous QHS   insulin aspart  3 Units Subcutaneous TID WC   isosorbide-hydrALAZINE  1 tablet Oral TID   multivitamin  1 tablet Oral QHS   pantoprazole  40 mg Oral Daily   polyethylene glycol  17 g Oral Daily   senna-docusate  1 tablet Oral BID   sodium chloride flush  3 mL Intravenous Q12H   traZODone  50 mg Oral QHS    Continuous Infusions:    anticoagulant sodium citrate     ferric gluconate (FERRLECIT) IVPB 250 mg (11/01/22 0900)      LOS: 7 days     Vernell Leep, MD,  FACP, FHM, [facility], [facility], [facility]   [facility] [place]     To contact the attending provider between 7A-7P or the covering provider during after hours 7P-7A, please log into the web site [email or web address] and access using universal [place] password for that web site. If you do not have the password, please call the hospital operator.  11/01/2022, 12:22 PM

## 2022-11-01 NOTE — Progress Notes (Signed)
19:42 Blood sugar via capillary was 298.    Glucose machine did not transfer the information over. Had the charge RN, Maree Erie Y to confirm the result. Proceed with administration of insulin.

## 2022-11-01 NOTE — Progress Notes (Signed)
[facility] Progress Note   Assessment/ Plan:    AKI on Advanced CKD-->ESRD: - uremic on admission-   had a graft placed on 10/18/22 - renal US neg for obstruction - HD #1 10/25/22 - had tempcath- had a slice in the catheter- not a pinhole- see images under media tab- IR quickly placed [facility]- greatly appreciate assistance - HD #2 10/27/22 - HD #3 10/29/22 - may have spot at [facility], may depend on SNF placement though - next HD Tuesday-  tomorrow -  may be able to use the AVG by the end of the month-   maybe OP schedule is MWF ?   will modify inpt schedule as needed   2.  Acute encephalopathy:             - likely multifactorial- uremia, COVID etc             - improving     3. COVID +             - got CAP coverage             - on dex  - dopplers negative for DVT  - s/p hep gtt   4.  Anemia:             - iron panel with Tsat 15%- started iron with HD             - ESA started in HD   5.  BMM:             - PTH pending -  phos high and calcium-  started phoslo    6.  Dispo: admitted, CLIP in process, awaiting  SNF  7. HTN/volume-  BP better-  norvasc 10/cardura 2, clonidine 0.3 BID, bidil- will dec clonidine to 0.2-  would love to get him off of this by discharge  UF as able with HD -   Subjective:   No new c/o's-   getting dialysis in the room-    SNF placement pending     Objective:   BP (!) 133/46 (BP Location: Right Arm)   Pulse (!) 43   Temp 98.2 F (36.8 C) (Oral)   Resp 13   Ht '5\' 7"'$  (1.702 m)   Wt 77.8 kg   SpO2 99%   BMI 26.86 kg/m   Physical Exam: GEN awake and alert- up in chair-  slow speaking but looks younger than age HEENT EOMI PERRL NECK NO JVD PULM clear anteriorly CV RRR no rubs, soft murmur ABD soft, nontender NABS EXT min pedal edema NEURO awake and alert SKIN warm and dry ACCESS: R IJ TDC-  also has left arm AVG-  great thrill and bruit  Labs: BMET Recent Labs  Lab 10/25/22 0927 10/26/22 0625 10/27/22 1241 10/29/22 0510  11/01/22 0216  NA 145 141 132* 130* 132*  K 3.5 3.5 4.0 3.9 4.2  CL 111 104 97* 94* 92*  CO2 13* 24 21* 24 25  GLUCOSE 166* 173* 268* 146* 129*  BUN 153* 100* 120* 96* 98*  CREATININE 7.61*  7.71* 5.49* 6.75* 5.52* 5.97*  CALCIUM 8.7* 8.4* 8.2* 8.0* 8.7*  PHOS  --   --  7.2* 5.8* 5.1*   CBC Recent Labs  Lab 10/29/22 0511 10/30/22 0445 10/31/22 0619 11/01/22 0216  WBC 6.6 9.5 12.5* 12.8*  HGB 7.5* 8.3* 7.7* 7.0*  HCT 22.1* 24.7* 22.7* 20.9*  MCV 80.7 82.1 81.4 81.6  PLT 154 203 249 257  Medications:     amLODipine  10 mg Oral Daily   atorvastatin  20 mg Oral Daily   bisacodyl  10 mg Rectal Once   calcium acetate  667 mg Oral TID WC   Chlorhexidine Gluconate Cloth  6 each Topical Q0600   Chlorhexidine Gluconate Cloth  6 each Topical Q0600   cholecalciferol  1,000 Units Oral Daily   cloNIDine  0.3 mg Oral BID   clopidogrel  75 mg Oral Daily   darbepoetin (ARANESP) injection - DIALYSIS  100 mcg Subcutaneous Q Thu-1800   dexamethasone (DECADRON) injection  6 mg Intravenous Q24H   doxazosin  2 mg Oral QHS   feeding supplement (NEPRO CARB STEADY)  237 mL Oral BID BM   folic acid  1 mg Oral Daily   heparin injection (subcutaneous)  5,000 Units Subcutaneous Q8H   insulin aspart  0-15 Units Subcutaneous TID WC   insulin aspart  0-5 Units Subcutaneous QHS   insulin aspart  3 Units Subcutaneous TID WC   isosorbide-hydrALAZINE  1 tablet Oral TID   multivitamin  1 tablet Oral QHS   pantoprazole  40 mg Oral Daily   polyethylene glycol  17 g Oral Daily   senna-docusate  1 tablet Oral BID   sodium chloride flush  3 mL Intravenous Q12H   traZODone  50 mg Oral QHS     Nelsie Domino A Icholas Irby  11/01/2022, 8:18 AM

## 2022-11-01 NOTE — Progress Notes (Signed)
POST HD TX NOTE  11/01/22 1113  Vitals  Temp 98.4 F (36.9 C)  Temp Source Oral  BP (!) 150/50  MAP (mmHg) 79  BP Location Right Arm  BP Method Automatic  Patient Position (if appropriate) Lying  Pulse Rate (!) 57  Pulse Rate Source Monitor  ECG Heart Rate (!) 59  Resp 12  Oxygen Therapy  SpO2 99 %  O2 Device Room Air  Pulse Oximetry Type Continuous  During Treatment Monitoring  Intra-Hemodialysis Comments (S)   (post HD tx VS check)  Post Treatment  Dialyzer Clearance Lightly streaked  Duration of HD Treatment -hour(s) 3.25 hour(s)  Hemodialysis Intake (mL) 270 mL (Ferric Gluconate)  Liters Processed 58.5  Fluid Removed (mL) 1530 mL (1872m (value from machine) - 2725m(Ferric Gluconate) = 153075m Tolerated HD Treatment Yes  Post-Hemodialysis Comments (S)  tx completed w/o problem, UF goal met, blood rinsed back. Medication Admin: Ferric Gluconate '250mg'$  IVPB, Heparin Dwells 3800 units  Hemodialysis Catheter Right Internal jugular Double lumen Permanent (Tunneled)  Placement Date/Time: 10/27/22 1407   Serial / Lot #: [id number] Date: 09/01/26  Time Out: Correct patient;Correct site;Correct procedure  Maximum sterile barrier precautions: Hand hygiene;Cap;Mask;Sterile gown;Sterile gloves;Large sterile ...  Site Condition No complications  Blue Lumen Status Heparin locked;Dead end cap in place  Red Lumen Status Heparin locked;Dead end cap in place  Purple Lumen Status N/A  Catheter fill solution Heparin 1000 units/ml  Catheter fill volume (Arterial) 1.9 cc  Catheter fill volume (Venous) 1.9  Dressing Type Transparent  Dressing Status Antimicrobial disc in place;Clean, Dry, Intact  Drainage Description None  Dressing Change Due 11/05/22  Post treatment catheter status Capped and Clamped

## 2022-11-01 NOTE — Procedures (Signed)
Patient was seen on dialysis and the procedure was supervised.  BFR 300  Via TDC BP is  137/60.   Patient appears to be tolerating treatment well  Louis Meckel 11/01/2022

## 2022-11-02 DIAGNOSIS — J9601 Acute respiratory failure with hypoxia: Secondary | ICD-10-CM | POA: Diagnosis not present

## 2022-11-02 LAB — GLUCOSE, CAPILLARY
Glucose-Capillary: 156 mg/dL — ABNORMAL HIGH (ref 70–99)
Glucose-Capillary: 219 mg/dL — ABNORMAL HIGH (ref 70–99)
Glucose-Capillary: 298 mg/dL — ABNORMAL HIGH (ref 70–99)
Glucose-Capillary: 318 mg/dL — ABNORMAL HIGH (ref 70–99)
Glucose-Capillary: 124 mg/dL — ABNORMAL HIGH (ref 70–99)

## 2022-11-02 LAB — C-REACTIVE PROTEIN: CRP: 0.9 mg/dL (ref <1.0)

## 2022-11-02 LAB — TYPE AND SCREEN
ABO/RH(D): O POS
Antibody Screen: NEGATIVE

## 2022-11-02 LAB — CBC
HCT: 24.5 % — ABNORMAL LOW (ref 39.0–52.0)
Hemoglobin: 8.1 g/dL — ABNORMAL LOW (ref 13.0–17.0)
MCH: 27.1 pg (ref 26.0–34.0)
MCHC: 33.1 g/dL (ref 30.0–36.0)
MCV: 81.9 fL (ref 80.0–100.0)
Platelets: 270 K/uL (ref 150–400)
RBC: 2.99 MIL/uL — ABNORMAL LOW (ref 4.22–5.81)
RDW: 14.2 % (ref 11.5–15.5)
WBC: 14.6 K/uL — ABNORMAL HIGH (ref 4.0–10.5)
nRBC: 1.5 % — ABNORMAL HIGH (ref 0.0–0.2)

## 2022-11-02 LAB — D-DIMER, QUANTITATIVE: D-Dimer, Quant: 5.45 ug/mL-FEU — ABNORMAL HIGH (ref 0.00–0.50)

## 2022-11-02 MED ORDER — CHLORHEXIDINE GLUCONATE CLOTH 2 % EX PADS
6.0000 | MEDICATED_PAD | Freq: Every day | CUTANEOUS | Status: DC
  Administered 2022-11-02 – 2022-11-05 (×4): 6 via TOPICAL

## 2022-11-02 NOTE — Inpatient Diabetes Management (Signed)
Inpatient Diabetes Program Recommendations  AACE/ADA: New Consensus Statement on Inpatient Glycemic Control (2015)  Target Ranges:  Prepandial:   less than 140 mg/dL      Peak postprandial:   less than 180 mg/dL (1-2 hours)      Critically ill patients:  140 - 180 mg/dL   Lab Results  Component Value Date   GLUCAP 219 (H) 11/02/2022   HGBA1C 7.4 (H) 10/25/2022    Review of Glycemic Control  Latest Reference Range & Units 11/01/22 07:18 11/01/22 12:05 11/01/22 16:10 11/01/22 19:42 11/02/22 00:29 11/02/22 07:57  Glucose-Capillary 70 - 99 mg/dL 124 (H) 174 (H) 318 (H) 298 (H) 156 (H) 219 (H)  (H): Data is abnormally high  Diabetes history: DM2 Outpatient Diabetes medications: None listed Current orders for Inpatient glycemic control: Novolog 0-15 units TID and 0-5 units QHS, Novolog 3 units TID with meals, Decadron 6 mg QD  Inpatient Diabetes Program Recommendations:    Please consider:  Novolog 5 units TID with meals.  Will continue to follow while inpatient.  Thank you, Reche Dixon, MSN, [place] [facility] Coordinator Inpatient Diabetes Program [phone] (team pager from 8a-5p)

## 2022-11-02 NOTE — Progress Notes (Signed)
Nutrition Follow-up  DOCUMENTATION CODES:   Severe malnutrition in context of chronic illness  INTERVENTION:  - Continue Nepro Shake po BID, each supplement provides 425 kcal and 19 grams protein  NUTRITION DIAGNOSIS:   Moderate Malnutrition related to poor appetite, chronic illness as evidenced by energy intake < or equal to 75% for > or equal to 1 month, mild fat depletion, moderate muscle depletion.  GOAL:   Patient will meet greater than or equal to 90% of their needs  MONITOR:   PO intake, Weight trends, Supplement acceptance, I & O's, Labs  REASON FOR ASSESSMENT:   Consult Diet education  ASSESSMENT:   88 yo male presnets with generalized weakness and admitted with acute hypoxic respiratory failure, fever, Covid-pneumonia, AKI on CKD 5 requiring initiation of HD.  PMHx CHF, chronic constipation, DM2, CAD, CKD 5 s/p AV fistula 2 weeks ago, prior GI bleed  Meds reviewed: Lipitor, phoslo, Vitamin D, Decadron, folic acid, sliding scale insulin, Rena-vit, miralax, senokot.   Pt ate 75% of his breakfast this am. Per record, pt has been eating 50-100% of his meals over the past 7 days. Pt is meeting his needs at this time. RD will continue to monitor PO intakes.   Diet Order:   Diet Order             Diet regular Room service appropriate? Yes; Fluid consistency: Thin; Fluid restriction: 1200 mL Fluid  Diet effective now                   EDUCATION NEEDS:   Education needs have been addressed  Skin:  Skin Assessment: Reviewed RN Assessment  Last BM:  3/10  Height:   Ht Readings from Last 1 Encounters:  10/25/22 '5\' 7"'$  (1.702 m)    Weight:   Wt Readings from Last 1 Encounters:  11/01/22 76.2 kg    Ideal Body Weight:     BMI:  Body mass index is 26.31 kg/m.  Estimated Nutritional Needs:   Kcal:  1700-2000 kcal/day  Protein:  85-100 g protein/day  Fluid:  </= 1200 ml  Thalia Bloodgood, RD, LDN, CNSC.

## 2022-11-02 NOTE — Progress Notes (Signed)
Mobility Specialist - Progress Note   11/02/22 0939  Mobility  Activity Ambulated with assistance in hallway  Level of Assistance Minimal assist, patient does 75% or more  Assistive Device Front wheel walker  Distance Ambulated (ft) 100 ft  Activity Response Tolerated well  Mobility Referral Yes  $Mobility charge 1 Mobility   Pt was received in bed and agreeable to mobility. Pt was MinA to stand from bed and SB throughout ambulation. Pt was returned to bed with all needs met and bed alarm on.  Franki Monte  Mobility Specialist Please contact via Solicitor or Rehab office at [phone]

## 2022-11-02 NOTE — Progress Notes (Signed)
PROGRESS NOTE  Austin [name] [id number] DOB: [birth year] DOA: 10/25/2022 PCP: Dorothyann Peng, NP   LOS: 8 days   Brief Narrative / Interim history: 88 year old married male, independent, medical history significant for type II DM, HTN, CAD, prior GI bleed, CHF, sleep apnea, stage V CKD, recent placement of left upper extremity AV fistula on 10/18/2022, presented to the ED on 10/25/2022 with complaints of generalized weakness, not acting like himself, EMS noted fever of 102 F and oxygen saturation in the low 90s.  He was admitted for acute respiratory failure with hypoxia, COVID-19 acute bronchitis, acute kidney injury complicating stage V CKD with early uremia.  Nephrology was consulted, right temporary HD catheter (switched to [facility] on 3/7) was placed and started HD on 2/5.  Improving.  Awaiting SNF placement following which renal navigator will assist with outpatient HD arrangements.  TOC on board.   Subjective / 24h Interval events: Complains of intermittent indigestion.  Still has residual cough but overall feels much better.  Denies any shortness of breath, no chest pain  Assesement and Plan: Principal Problem:   Acute respiratory failure with hypoxia (HCC) Active Problems:   Type 2 diabetes mellitus with hyperlipidemia (HCC)   Iron deficiency anemia due to chronic blood loss   Essential hypertension   GERD   Acute kidney injury superimposed on CKD ([place])   Pneumonia due to COVID-19 virus   CKD (chronic kidney disease) stage 5, GFR less than 15 ml/min (HCC)   Severe pulmonary hypertension (HCC)   Moderate aortic regurgitation   CAD in native artery   Protein-calorie malnutrition, severe   ESRD on dialysis [facility])   Principal problem Acute kidney injury complicating stage V CKD, possibly new ESRD with volume overload/metabolic acidosis - S/p LUE AVG placed 2/27.  Nephrology following.  Now on IHD.  He had a temporary HD cath which was removed and replaced by [facility] on 3/7, due to some  bleeding complications.  Awaiting SNF placement, renal navigator assisting with outpatient HD arrangements as well.  Active problems COVID 19 acute bronchitis/acute respiratory failure with hypoxia - Hypoxia resolved.  Remains on IV Decadron 6 mg daily (day 9/10).  On admission, did not meet criteria for Actemra.  Improving, currently on room air, he initially tested positive on 3/5 and will need to come off isolation on 3/15.  He was briefly on heparin due to elevated D-dimer, however with low index of suspicion for PE to begin with, negative lower extremity venous Dopplers, bleeding from HD catheter site, and pharmacy recommendations, discontinued anticoagulation and changed to prophylactic dose.   Acute metabolic encephalopathy- Multifactorial due to ESRD with uremia, COVID-19.  This is now resolved   Anemia due to ESRD -started on iron and ESA with HD.  No bleeding, hemoglobin 8.1 this morning   Type II DM with renal complications - [id number] 7.4, acceptable in his age group   Hypertension -on amlodipine, clonidine, Cardura.  Blood pressure acceptable   Dyslipidemia - Continue statins.   Chronic combined CHF/severe pulmonary hypertension/moderate aortic regurgitation - Cardiac meds as below, appears Evola, volume management with HD   CAD - No angina.  Continue clopidogrel and statins.   Reported OSA - Unclear if patient on CPAP prior to admission   Prior history of GI bleeding - Asymptomatic.  Scheduled Meds:  amLODipine  10 mg Oral QHS   atorvastatin  20 mg Oral Daily   calcium acetate  667 mg Oral TID WC   Chlorhexidine Gluconate Cloth  6 each Topical  [id number]   Chlorhexidine Gluconate Cloth  6 each Topical Q0600   Chlorhexidine Gluconate Cloth  6 each Topical Q0600   cholecalciferol  1,000 Units Oral Daily   cloNIDine  0.2 mg Oral BID   clopidogrel  75 mg Oral Daily   darbepoetin (ARANESP) injection - DIALYSIS  100 mcg Subcutaneous Q Thu-1800   dexamethasone (DECADRON) injection  6 mg  Intravenous Q24H   doxazosin  2 mg Oral QHS   feeding supplement (NEPRO CARB STEADY)  237 mL Oral BID BM   folic acid  1 mg Oral Daily   heparin injection (subcutaneous)  5,000 Units Subcutaneous Q8H   insulin aspart  0-15 Units Subcutaneous TID WC   insulin aspart  0-5 Units Subcutaneous QHS   insulin aspart  3 Units Subcutaneous TID WC   isosorbide-hydrALAZINE  1 tablet Oral TID   multivitamin  1 tablet Oral QHS   pantoprazole  40 mg Oral Daily   polyethylene glycol  17 g Oral Daily   senna-docusate  1 tablet Oral BID   sodium chloride flush  3 mL Intravenous Q12H   traZODone  50 mg Oral QHS   Continuous Infusions:  anticoagulant sodium citrate     ferric gluconate (FERRLECIT) IVPB 250 mg (11/01/22 0900)   PRN Meds:.acetaminophen **OR** acetaminophen, alteplase, alum & mag hydroxide-simeth, anticoagulant sodium citrate, bisacodyl, guaiFENesin-dextromethorphan, heparin, ondansetron **OR** ondansetron (ZOFRAN) IV  Current Outpatient Medications  Medication Instructions   ACCU-CHEK GUIDE test strip USE TO CHECK BLOOD GLUCOSE TWICE A DAY OR AS NEEDED   Accu-Chek Softclix Lancets lancets USED TO CHECK BLOOD GLUCOSE TWICE A DAY OR AS NEEDED   acetaminophen (TYLENOL) 1,000 mg, Oral, 2 times daily   albuterol (VENTOLIN HFA) 108 (90 Base) MCG/ACT inhaler TAKE 2 PUFFS BY MOUTH EVERY 6 HOURS AS NEEDED FOR WHEEZE OR SHORTNESS OF BREATH   amLODipine (NORVASC) 10 MG tablet TAKE 1 TABLET BY MOUTH EVERY DAY   atorvastatin (LIPITOR) 20 MG tablet TAKE 1 TABLET BY MOUTH EVERY DAY   cholecalciferol (VITAMIN D3) 1,000 Units, Oral, Daily   cloNIDine (CATAPRES) 0.3 mg, Oral, 3 times daily   clopidogrel (PLAVIX) 75 MG tablet TAKE 1 TABLET BY MOUTH EVERY DAY   doxazosin (CARDURA) 2 mg, Oral, Daily at bedtime   esomeprazole (NEXIUM) 40 MG capsule TAKE 1 CAPSULE BY MOUTH EVERY DAY   eszopiclone (LUNESTA) 1 mg, Oral, At bedtime PRN, Take immediately before bedtime   guaiFENesin (MUCINEX) 600 mg, Oral,  Daily at bedtime   hydrocortisone (ANUSOL-HC) 2.5 % rectal cream 1 application , Rectal, 2 times daily   isosorbide-hydrALAZINE (BIDIL) 20-37.5 MG tablet 1 tablet, Oral, 3 times daily   lactulose (CHRONULAC) 10 GM/15ML solution TAKE 15ML BY MOUTH 2 TIMES DAILY AS NEEDED FOR MILD CONSTIPATION.   Melatonin 10 mg, Oral, Daily at bedtime   methylcellulose (CITRUCEL) oral powder 1 tablespoon every 2-3 days,alternating with lactulose   metolazone (ZAROXOLYN) 5 mg, Oral, Weekly   metoprolol succinate (TOPROL-XL) 100 mg, Oral, Daily, Take with or immediately following a meal.   neomycin-polymyxin b-dexamethasone (MAXITROL) 3.5-10000-0.1 SUSP 1 drop, Both Eyes, 4 times daily   oxyCODONE-acetaminophen (PERCOCET/ROXICET) 5-325 MG tablet 1 tablet, Oral, Every 6 hours PRN   potassium chloride (KLOR-CON) 10 MEQ tablet 20 mEq, Oral, 2 times daily   potassium chloride SA (KLOR-CON M) 20 MEQ tablet 20 mEq, Oral, 2 times daily   Propylene Glycol (SYSTANE BALANCE OP) 1 drop, Both Eyes, Daily at bedtime   torsemide (DEMADEX) 40 mg, Oral, 2 times  daily   triamcinolone cream (KENALOG) 0.1 % 1 application , Topical, Daily PRN    Diet Orders (From admission, onward)     Start     Ordered   10/28/22 1608  Diet regular Room service appropriate? Yes; Fluid consistency: Thin; Fluid restriction: 1200 mL Fluid  Diet effective now       Comments: No salt packet  Question Answer Comment  Room service appropriate? Yes   Fluid consistency: Thin   Fluid restriction: 1200 mL Fluid      10/28/22 1609            DVT prophylaxis: heparin injection 5,000 Units Start: 10/27/22 1500   Lab Results  Component Value Date   PLT 270 11/02/2022      Code Status: Full Code  Family Communication: no family at bedside   Status is: Inpatient  Remains inpatient appropriate because: Covid  Level of care: Med-Surg  Consultants:  Nephrology   Objective: Vitals:   11/01/22 1609 11/01/22 1940 11/02/22 0603 11/02/22  0756  BP: (!) 152/47 (!) 151/51 (!) 134/52 (!) 149/55  Pulse: 64 66 66 72  Resp: '18 17 16 18  '$ Temp: 98.3 F (36.8 C) 98 F (36.7 C) 98.6 F (37 C) 98.5 F (36.9 C)  TempSrc: Oral Oral Oral Oral  SpO2: 100% 100% 98% 96%  Weight:      Height:        Intake/Output Summary (Last 24 hours) at 11/02/2022 1125 Last data filed at 11/02/2022 1047 Gross per 24 hour  Intake 643 ml  Output 300 ml  Net 343 ml   Wt Readings from Last 3 Encounters:  11/01/22 76.2 kg  10/18/22 79.4 kg  09/30/22 79.4 kg    Examination:  Constitutional: NAD Eyes: no scleral icterus ENMT: Mucous membranes are moist.  Neck: normal, supple Respiratory: clear to auscultation bilaterally, no wheezing, no crackles. Normal respiratory effort. No accessory muscle use.  Cardiovascular: Regular rate and rhythm, no murmurs / rubs / gallops. No LE edema.  Abdomen: non distended, no tenderness. Bowel sounds positive.  Musculoskeletal: no clubbing / cyanosis.  Skin: no rashes Neurologic: non focal   Data Reviewed: I have independently reviewed following labs and imaging studies   CBC Recent Labs  Lab 10/29/22 0511 10/30/22 0445 10/31/22 0619 11/01/22 0216 11/02/22 0236  WBC 6.6 9.5 12.5* 12.8* 14.6*  HGB 7.5* 8.3* 7.7* 7.0* 8.1*  HCT 22.1* 24.7* 22.7* 20.9* 24.5*  PLT 154 203 249 257 270  MCV 80.7 82.1 81.4 81.6 81.9  MCH 27.4 27.6 27.6 27.3 27.1  MCHC 33.9 33.6 33.9 33.5 33.1  RDW 13.9 13.8 13.8 13.9 14.2    Recent Labs  Lab 10/27/22 1241 10/28/22 0039 10/29/22 0510 10/29/22 0511 10/30/22 0445 10/31/22 0619 11/01/22 0216 11/02/22 0236  NA 132*  --  130*  --   --   --  132*  --   K 4.0  --  3.9  --   --   --  4.2  --   CL 97*  --  94*  --   --   --  92*  --   CO2 21*  --  24  --   --   --  25  --   GLUCOSE 268*  --  146*  --   --   --  129*  --   BUN 120*  --  96*  --   --   --  98*  --   CREATININE 6.75*  --  5.52*  --   --   --  5.97*  --   CALCIUM 8.2*  --  8.0*  --   --   --  8.7*  --    ALBUMIN 2.5*  --  2.5*  --   --   --  2.5*  --   CRP  --    < >  --  3.5* 2.5* 2.2* 1.1* 0.9  DDIMER  --    < >  --  2.32* 3.00* 3.86* 3.53* 5.45*   < > = values in this interval not displayed.    ------------------------------------------------------------------------------------------------------------------ No results for input(s): "CHOL", "HDL", "LDLCALC", "TRIG", "CHOLHDL", "LDLDIRECT" in the last 72 hours.  Lab Results  Component Value Date   HGBA1C 7.4 (H) 10/25/2022   ------------------------------------------------------------------------------------------------------------------ No results for input(s): "TSH", "T4TOTAL", "T3FREE", "THYROIDAB" in the last 72 hours.  Invalid input(s): "FREET3"  Cardiac Enzymes No results for input(s): "CKMB", "TROPONINI", "MYOGLOBIN" in the last 168 hours.  Invalid input(s): "CK" ------------------------------------------------------------------------------------------------------------------    Component Value Date/Time   BNP 2,179.9 (H) 10/25/2022 0555    CBG: Recent Labs  Lab 11/01/22 1205 11/01/22 1610 11/01/22 1942 11/02/22 0029 11/02/22 0757  GLUCAP 174* 318* 298* 156* 219*    Recent Results (from the past 240 hour(s))  Resp panel by RT-PCR (RSV, Flu A&B, Covid) Anterior Nasal Swab     Status: Abnormal   Collection Time: 10/25/22  5:46 AM   Specimen: Anterior Nasal Swab  Result Value Ref Range Status   SARS Coronavirus 2 by RT PCR POSITIVE (A) NEGATIVE Final   Influenza A by PCR NEGATIVE NEGATIVE Final   Influenza B by PCR NEGATIVE NEGATIVE Final    Comment: (NOTE) The Xpert Xpress SARS-CoV-2/FLU/RSV plus assay is intended as an aid in the diagnosis of influenza from Nasopharyngeal swab specimens and should not be used as a sole basis for treatment. Nasal washings and aspirates are unacceptable for Xpert Xpress SARS-CoV-2/FLU/RSV testing.  Fact Sheet for  Patients: EntrepreneurPulse.com.au  Fact Sheet for Healthcare Providers: IncredibleEmployment.be  This test is not yet approved or cleared by the [place] FDA and has been authorized for detection and/or diagnosis of SARS-CoV-2 by FDA under an Emergency Use Authorization (EUA). This EUA will remain in effect (meaning this test can be used) for the duration of the COVID-19 declaration under Section 564(b)(1) of the Act, 21 U.S.C. section 360bbb-3(b)(1), unless the authorization is terminated or revoked.     Resp Syncytial Virus by PCR NEGATIVE NEGATIVE Final    Comment: (NOTE) Fact Sheet for Patients: EntrepreneurPulse.com.au  Fact Sheet for Healthcare Providers: IncredibleEmployment.be  This test is not yet approved or cleared by the [place] FDA and has been authorized for detection and/or diagnosis of SARS-CoV-2 by FDA under an Emergency Use Authorization (EUA). This EUA will remain in effect (meaning this test can be used) for the duration of the COVID-19 declaration under Section 564(b)(1) of the Act, 21 U.S.C. section 360bbb-3(b)(1), unless the authorization is terminated or revoked.  Performed at [facility], [place] [address] [place], [place] [address]   Blood Culture (routine x 2)     Status: None   Collection Time: 10/25/22  6:10 AM   Specimen: BLOOD RIGHT HAND  Result Value Ref Range Status   Specimen Description BLOOD RIGHT HAND  Final   Special Requests   Final    BOTTLES DRAWN AEROBIC AND ANAEROBIC Blood Culture adequate volume   Culture   Final    NO GROWTH  5 DAYS Performed at [facility], [place] [address] [place], [place] [address]    Report Status 10/30/2022 FINAL  Final  Blood Culture (routine x 2)     Status: None   Collection Time: 10/25/22  6:15 AM   Specimen: BLOOD RIGHT HAND  Result Value Ref Range Status   Specimen Description BLOOD RIGHT HAND  Final    Special Requests   Final    BOTTLES DRAWN AEROBIC AND ANAEROBIC Blood Culture results may not be optimal due to an inadequate volume of blood received in culture bottles   Culture   Final    NO GROWTH 5 DAYS Performed at [facility], [place] [address] [place], [place] [address]    Report Status 10/30/2022 FINAL  Final     Radiology Studies: No results found.   Marzetta Board, MD, PhD [facility]  Between 7 am - 7 pm I am available, please contact me via Amion (for emergencies) or Securechat (non urgent messages)  Between 7 pm - 7 am I am not available, please contact night coverage MD/APP via Amion

## 2022-11-02 NOTE — Progress Notes (Signed)
Physical Therapy Treatment Patient Details Name: Austin [name] MRN: [id number] DOB: 1935-04-25 Today's Date: 11/02/2022   History of Present Illness Pt is 88 year old presented to [facility] on 10/25/22 with acute hypoxic respiratory failure due to acute Covid. Pt with continued progression of ckd and temporary HD cath placed and HD intitiated. PMH: ckd with impending HD, diastolic CHF, DM2 HTN, HLD, prostate CA hx    PT Comments    Pt admitted with above diagnosis. Pt was able to ambulate with RW with min guard to supervision. Pt is safe overall with RW but cannot ambulate without UE support. Has incr steps at home therefore continues to recommend SNF for therapy prior to d/c home. Family in room and agree and asking to speak with SW.   Pt currently with functional limitations due to balance and endurance deficits. Pt will benefit from skilled PT to increase their independence and safety with mobility to allow discharge to the venue listed below.      Recommendations for follow up therapy are one component of a multi-disciplinary discharge planning process, led by the attending physician.  Recommendations may be updated based on patient status, additional functional criteria and insurance authorization.  Follow Up Recommendations  Skilled nursing-short term rehab (<3 hours/day) Can patient physically be transported by private vehicle: Yes   Assistance Recommended at Discharge Frequent or constant Supervision/Assistance  Patient can return home with the following A little help with walking and/or transfers;A little help with bathing/dressing/bathroom;Assistance with cooking/housework;Help with stairs or ramp for entrance;Assist for transportation   Equipment Recommendations  Wheelchair cushion (measurements PT);Wheelchair (measurements PT)    Recommendations for Other Services       Precautions / Restrictions Precautions Precautions: Fall Restrictions Weight Bearing Restrictions: No      Mobility  Bed Mobility Overal bed mobility: Needs Assistance Bed Mobility: Supine to Sit     Supine to sit: Min assist     General bed mobility comments: Needed min assist to come to eOB with use of pad to scoot to EOB.    Transfers Overall transfer level: Needs assistance Equipment used: Rolling walker (2 wheels) Transfers: Sit to/from Stand Sit to Stand: Min guard           General transfer comment: incr time to  power up but pt didnt need physical assist. Verbal cues for hand placement    Ambulation/Gait Ambulation/Gait assistance: Min guard, Supervision Gait Distance (Feet): 280 Feet Assistive device: Rolling walker (2 wheels) Gait Pattern/deviations: Step-through pattern, Decreased step length - right, Decreased step length - left, Decreased stride length, Trunk flexed Gait velocity: decr Gait velocity interpretation: <1.31 ft/sec, indicative of household ambulator   General Gait Details: Assist for support and stability.  Pt does demonstrate improved safety awareness.   Stairs             Wheelchair Mobility    Modified Rankin (Stroke Patients Only)       Balance Overall balance assessment: Needs assistance Sitting-balance support: No upper extremity supported, Feet supported Sitting balance-Leahy Scale: Good     Standing balance support: Bilateral upper extremity supported, During functional activity Standing balance-Leahy Scale: Poor Standing balance comment: walker and min guard for static standing                            Cognition Arousal/Alertness: Awake/alert Behavior During Therapy: Flat affect Overall Cognitive Status: Within Functional Limits for tasks assessed  Exercises General Exercises - Lower Extremity Ankle Circles/Pumps: AROM, Both, 10 reps, Seated Long Arc Quad: AROM, Both, 10 reps, Seated Hip Flexion/Marching: AROM, Both, 10 reps, Seated     General Comments General comments (skin integrity, edema, etc.): VSS on RA. Family asking to speak with SW.  PT messaged SW.      Pertinent Vitals/Pain Pain Assessment Pain Assessment: No/denies pain    Home Living                          Prior Function            PT Goals (current goals can now be found in the care plan section) Acute Rehab PT Goals Patient Stated Goal: return home Progress towards PT goals: Progressing toward goals    Frequency    Min 3X/week      PT Plan Current plan remains appropriate    Co-evaluation              AM-PAC PT "6 Clicks" Mobility   Outcome Measure  Help needed turning from your back to your side while in a flat bed without using bedrails?: A Little Help needed moving from lying on your back to sitting on the side of a flat bed without using bedrails?: A Little Help needed moving to and from a bed to a chair (including a wheelchair)?: A Little Help needed standing up from a chair using your arms (e.g., wheelchair or bedside chair)?: A Little Help needed to walk in hospital room?: A Little Help needed climbing 3-5 steps with a railing? : A Lot 6 Click Score: 17    End of Session Equipment Utilized During Treatment: Gait belt Activity Tolerance: Patient limited by fatigue Patient left: with call bell/phone within reach;in bed;with bed alarm set;with family/visitor present Nurse Communication: Mobility status PT Visit Diagnosis: Other abnormalities of gait and mobility (R26.89);Muscle weakness (generalized) (M62.81)     Time: [id number] PT Time Calculation (min) (ACUTE ONLY): 22 min  Charges:  $Gait Training: 8-22 mins                     Quamir Willemsen M,PT Acute Rehab Services [place] 11/02/2022, 12:44 PM

## 2022-11-02 NOTE — Progress Notes (Signed)
[facility] Progress Note   Assessment/ Plan:    AKI on Advanced CKD-->ESRD: - uremic on admission-   had a graft placed on 10/18/22 - HD #1 10/25/22 - had tempcath- had a slice in the catheter- not a pinhole- see images under media tab- IR quickly placed [facility]- greatly appreciate assistance - HD #2 10/27/22 - HD #3 10/29/22, HD #4 on 3/12 - may have spot at [facility], may depend on SNF placement though - next HD Thursday ( tomorrow)-  may be able to use the AVG by the end of the month-   maybe OP schedule is MWF ?   will modify inpt schedule as needed   2.  Acute encephalopathy:             - likely multifactorial- uremia, COVID etc             - improving     3. COVID +             - got CAP coverage             - on dex  - dopplers negative for DVT  - s/p hep gtt   4.  Anemia:             - iron panel with Tsat 15%- started iron with HD             - ESA started in HD   5.  BMM:             - PTH pending -  phos high and calcium-  started phoslo    6.  Dispo: admitted, CLIP in process, awaiting  SNF  7. HTN/volume-  BP fine on  norvasc 10/cardura 2, clonidine 0.3 BID,  and bidil- dec clonidine to 0.2 on 3/12-  would love to get him off of this by discharge  UF as able with HD -   Subjective:   No new c/o's-   HD yest-  removed 1530, tolerated well-     SNF placement pending -  maybe a little slower speech today     Objective:   BP (!) 134/52 (BP Location: Right Arm)   Pulse 66   Temp 98.6 F (37 C) (Oral)   Resp 16   Ht '5\' 7"'$  (1.702 m)   Wt 76.2 kg   SpO2 98%   BMI 26.31 kg/m   Physical Exam: GEN awake and alert- up in chair-  slow speaking but looks younger than age HEENT EOMI PERRL NECK NO JVD PULM clear anteriorly CV RRR no rubs, soft murmur ABD soft, nontender NABS EXT min pedal edema NEURO awake and alert SKIN warm and dry ACCESS: R IJ TDC-  also has left arm AVG-  great thrill and bruit  Labs: BMET Recent Labs  Lab 10/27/22 1241  10/29/22 0510 11/01/22 0216  NA 132* 130* 132*  K 4.0 3.9 4.2  CL 97* 94* 92*  CO2 21* 24 25  GLUCOSE 268* 146* 129*  BUN 120* 96* 98*  CREATININE 6.75* 5.52* 5.97*  CALCIUM 8.2* 8.0* 8.7*  PHOS 7.2* 5.8* 5.1*   CBC Recent Labs  Lab 10/30/22 0445 10/31/22 0619 11/01/22 0216 11/02/22 0236  WBC 9.5 12.5* 12.8* 14.6*  HGB 8.3* 7.7* 7.0* 8.1*  HCT 24.7* 22.7* 20.9* 24.5*  MCV 82.1 81.4 81.6 81.9  PLT 203 249 257 270      Medications:     amLODipine  10 mg Oral QHS  atorvastatin  20 mg Oral Daily   calcium acetate  667 mg Oral TID WC   Chlorhexidine Gluconate Cloth  6 each Topical Q0600   Chlorhexidine Gluconate Cloth  6 each Topical Q0600   cholecalciferol  1,000 Units Oral Daily   cloNIDine  0.2 mg Oral BID   clopidogrel  75 mg Oral Daily   darbepoetin (ARANESP) injection - DIALYSIS  100 mcg Subcutaneous Q Thu-1800   dexamethasone (DECADRON) injection  6 mg Intravenous Q24H   doxazosin  2 mg Oral QHS   feeding supplement (NEPRO CARB STEADY)  237 mL Oral BID BM   folic acid  1 mg Oral Daily   heparin injection (subcutaneous)  5,000 Units Subcutaneous Q8H   insulin aspart  0-15 Units Subcutaneous TID WC   insulin aspart  0-5 Units Subcutaneous QHS   insulin aspart  3 Units Subcutaneous TID WC   isosorbide-hydrALAZINE  1 tablet Oral TID   multivitamin  1 tablet Oral QHS   pantoprazole  40 mg Oral Daily   polyethylene glycol  17 g Oral Daily   senna-docusate  1 tablet Oral BID   sodium chloride flush  3 mL Intravenous Q12H   traZODone  50 mg Oral QHS     Austin [name]  11/02/2022, 7:55 AM

## 2022-11-03 DIAGNOSIS — J9601 Acute respiratory failure with hypoxia: Secondary | ICD-10-CM | POA: Diagnosis not present

## 2022-11-03 LAB — GLUCOSE, CAPILLARY
Glucose-Capillary: 173 mg/dL — ABNORMAL HIGH (ref 70–99)
Glucose-Capillary: 187 mg/dL — ABNORMAL HIGH (ref 70–99)
Glucose-Capillary: 198 mg/dL — ABNORMAL HIGH (ref 70–99)
Glucose-Capillary: 319 mg/dL — ABNORMAL HIGH (ref 70–99)
Glucose-Capillary: 166 mg/dL — ABNORMAL HIGH (ref 70–99)
Glucose-Capillary: 212 mg/dL — ABNORMAL HIGH (ref 70–99)

## 2022-11-03 LAB — COMPREHENSIVE METABOLIC PANEL
ALT: 11 U/L (ref 0–44)
AST: 20 U/L (ref 15–41)
Albumin: 2.6 g/dL — ABNORMAL LOW (ref 3.5–5.0)
Alkaline Phosphatase: 57 U/L (ref 38–126)
Anion gap: 10 (ref 5–15)
BUN: 90 mg/dL — ABNORMAL HIGH (ref 8–23)
CO2: 27 mmol/L (ref 22–32)
Calcium: 8.8 mg/dL — ABNORMAL LOW (ref 8.9–10.3)
Chloride: 95 mmol/L — ABNORMAL LOW (ref 98–111)
Creatinine, Ser: 5.5 mg/dL — ABNORMAL HIGH (ref 0.61–1.24)
GFR, Estimated: 9 mL/min — ABNORMAL LOW (ref >60)
Glucose, Bld: 193 mg/dL — ABNORMAL HIGH (ref 70–99)
Potassium: 4.4 mmol/L (ref 3.5–5.1)
Sodium: 132 mmol/L — ABNORMAL LOW (ref 135–145)
Total Bilirubin: 0.8 mg/dL (ref 0.3–1.2)
Total Protein: 5.4 g/dL — ABNORMAL LOW (ref 6.5–8.1)

## 2022-11-03 LAB — CBC
HCT: 22.3 % — ABNORMAL LOW (ref 39.0–52.0)
Hemoglobin: 7.3 g/dL — ABNORMAL LOW (ref 13.0–17.0)
MCH: 27 pg (ref 26.0–34.0)
MCHC: 32.7 g/dL (ref 30.0–36.0)
MCV: 82.6 fL (ref 80.0–100.0)
Platelets: 264 10*3/uL (ref 150–400)
RBC: 2.7 MIL/uL — ABNORMAL LOW (ref 4.22–5.81)
RDW: 14.8 % (ref 11.5–15.5)
WBC: 12.8 10*3/uL — ABNORMAL HIGH (ref 4.0–10.5)
nRBC: 2 % — ABNORMAL HIGH (ref 0.0–0.2)

## 2022-11-03 LAB — MAGNESIUM: Magnesium: 2 mg/dL (ref 1.7–2.4)

## 2022-11-03 MED ORDER — CLONIDINE HCL 0.1 MG PO TABS
0.1000 mg | ORAL_TABLET | Freq: Two times a day (BID) | ORAL | Status: DC
  Administered 2022-11-03 – 2022-11-04 (×3): 0.1 mg via ORAL
  Filled 2022-11-03 (×3): qty 1

## 2022-11-03 NOTE — Progress Notes (Signed)
[facility] Progress Note   Assessment/ Plan:    AKI on Advanced CKD-->ESRD: - uremic on admission-   had Austin graft placed on 10/18/22 - HD #1 10/25/22 - had tempcath- had Austin slice in the catheter- IR quickly placed [facility]- greatly appreciate assistance - HD #2 10/27/22 - HD #3 10/29/22, HD #4 on 3/12 - may have spot at [facility], may depend on SNF placement though - next HD today-  may be able to use the AVG by the end of the month-   maybe OP schedule is MWF ? But dependent on placement-   will modify inpt schedule as needed    2.  Acute encephalopathy:             - likely multifactorial- uremia, COVID etc             - improving     3. COVID +             - got CAP coverage             - on dex  - dopplers negative for DVT  - s/p hep gtt   4.  Anemia:             - iron panel with Tsat 15%- started iron with HD             - ESA started in HD   5.  BMM:             - PTH pending -  phos high and calcium-  started phoslo    6.  Dispo: admitted, CLIP in process, awaiting  SNF  7. HTN/volume-  BP fine on  norvasc 10/cardura 2, clonidine 0.3 BID,  and bidil- dec clonidine to 0.2 on 3/12-  would love to get him off of this by discharge  UF as able with HD -   Subjective:   No new c/o's-   due for HD today -  just woke up so is Austin little confused     SNF placement pending -      Objective:   BP (!) 133/48 (BP Location: Right Arm)   Pulse 60   Temp 98.8 F (37.1 C) (Oral)   Resp 17   Ht '5\' 7"'$  (1.702 m)   Wt 76.2 kg   SpO2 96%   BMI 26.31 kg/m   Physical Exam: GEN awake and alert- up in chair-  slow speaking but looks younger than age HEENT EOMI PERRL NECK NO JVD PULM clear anteriorly CV RRR no rubs, soft murmur ABD soft, nontender NABS EXT min pedal edema NEURO awake and alert SKIN warm and dry ACCESS: R IJ TDC-  also has left arm AVG-  great thrill and bruit  Labs: BMET Recent Labs  Lab 10/27/22 1241 10/29/22 0510 11/01/22 0216 11/03/22 0337  NA  132* 130* 132* 132*  K 4.0 3.9 4.2 4.4  CL 97* 94* 92* 95*  CO2 21* '24 25 27  '$ GLUCOSE 268* 146* 129* 193*  BUN 120* 96* 98* 90*  CREATININE 6.75* 5.52* 5.97* 5.50*  CALCIUM 8.2* 8.0* 8.7* 8.8*  PHOS 7.2* 5.8* 5.1*  --    CBC Recent Labs  Lab 10/31/22 0619 11/01/22 0216 11/02/22 0236 11/03/22 0337  WBC 12.5* 12.8* 14.6* 12.8*  HGB 7.7* 7.0* 8.1* 7.3*  HCT 22.7* 20.9* 24.5* 22.3*  MCV 81.4 81.6 81.9 82.6  PLT 249 257 270 264      Medications:  amLODipine  10 mg Oral QHS   atorvastatin  20 mg Oral Daily   calcium acetate  667 mg Oral TID WC   Chlorhexidine Gluconate Cloth  6 each Topical Q0600   Chlorhexidine Gluconate Cloth  6 each Topical Q0600   Chlorhexidine Gluconate Cloth  6 each Topical Q0600   cholecalciferol  1,000 Units Oral Daily   cloNIDine  0.2 mg Oral BID   clopidogrel  75 mg Oral Daily   darbepoetin (ARANESP) injection - DIALYSIS  100 mcg Subcutaneous Q Thu-1800   dexamethasone (DECADRON) injection  6 mg Intravenous Q24H   doxazosin  2 mg Oral QHS   feeding supplement (NEPRO CARB STEADY)  237 mL Oral BID BM   folic acid  1 mg Oral Daily   heparin injection (subcutaneous)  5,000 Units Subcutaneous Q8H   insulin aspart  0-15 Units Subcutaneous TID WC   insulin aspart  0-5 Units Subcutaneous QHS   insulin aspart  3 Units Subcutaneous TID WC   isosorbide-hydrALAZINE  1 tablet Oral TID   multivitamin  1 tablet Oral QHS   pantoprazole  40 mg Oral Daily   polyethylene glycol  17 g Oral Daily   senna-docusate  1 tablet Oral BID   sodium chloride flush  3 mL Intravenous Q12H   traZODone  50 mg Oral QHS     Austin [name] Austin [name]  11/03/2022, 8:00 AM

## 2022-11-03 NOTE — Progress Notes (Addendum)
Spoke to pt's granddtr Loma Sousa [phone] and provided current SNF offers. Loma Sousa discussed offers with pt/spouse and they have accepted [facility]. Confirmed with Janie at [facility] they are prepared to admit pt Saturday 3/16 after COVID quarantine, provided auth received and they have a bed available.   Will begin [employer] auth today and f/u with [facility] tomorrow to check bed availability for Saturday.   [facility] aware pt's HD schedule is MWF 12:40pm chair time at [facility] and confirmed they will transport.   SW will provide updates as available.    UPDATE 1515: Kara Dies request for SNF submitted, ref [id number].  Wandra Feinstein, MSW, LCSW [phone] (coverage)

## 2022-11-03 NOTE — Progress Notes (Addendum)
PROGRESS NOTE  Austin [name] [id number] DOB: 03-22-1935 DOA: 10/25/2022 PCP: Dorothyann Peng, NP   LOS: 9 days   Brief Narrative / Interim history: 88 year old married male, independent, medical history significant for type II DM, HTN, CAD, prior GI bleed, CHF, sleep apnea, stage V CKD, recent placement of left upper extremity AV fistula on 10/18/2022, presented to the ED on 10/25/2022 with complaints of generalized weakness, not acting like himself, EMS noted fever of 102 F and oxygen saturation in the low 90s.  He was admitted for acute respiratory failure with hypoxia, COVID-19 acute bronchitis, acute kidney injury complicating stage V CKD with early uremia.  Nephrology was consulted, right temporary HD catheter (switched to [facility] on 3/7) was placed and started HD on 2/5.  Improving.  Awaiting SNF placement following which renal navigator will assist with outpatient HD arrangements.  TOC on board.   Subjective / 24h Interval events: Feeling better today.  Cough is better and denies any shortness of breath.  Assesement and Plan: Principal Problem:   Acute respiratory failure with hypoxia (HCC) Active Problems:   Type 2 diabetes mellitus with hyperlipidemia (HCC)   Iron deficiency anemia due to chronic blood loss   Essential hypertension   GERD   Acute kidney injury superimposed on CKD ([place])   Pneumonia due to COVID-19 virus   CKD (chronic kidney disease) stage 5, GFR less than 15 ml/min (HCC)   Severe pulmonary hypertension (HCC)   Moderate aortic regurgitation   CAD in native artery   Protein-calorie malnutrition, severe   ESRD on dialysis [facility])   Principal problem Acute kidney injury complicating stage V CKD, possibly new ESRD with volume overload/metabolic acidosis - S/p LUE AVG placed 2/27.  Nephrology following.  Now on IHD.  He had a temporary HD cath which was removed and replaced by [facility] on 3/7, due to some bleeding complications.  Awaiting SNF placement, renal navigator assisting  with outpatient HD arrangements as well.  Plan to discharge on Saturday, it appears that he has outpatient HD spot  Active problems COVID 19 acute bronchitis/acute respiratory failure with hypoxia - Hypoxia resolved.  Remains on IV Decadron 6 mg daily (day 9/10).  On admission, did not meet criteria for Actemra.  Improving, currently on room air, he initially tested positive on 3/5 and will need to come off isolation on 3/15.  He was briefly on heparin due to elevated D-dimer, however with low index of suspicion for PE to begin with, negative lower extremity venous Dopplers, bleeding from HD catheter site, and pharmacy recommendations, discontinued anticoagulation and changed to prophylactic dose. -Symptoms have resolved, he will come off isolation tomorrow   Acute metabolic encephalopathy- Multifactorial due to ESRD with uremia, COVID-19.  This is now resolved   Anemia due to ESRD -started on iron and ESA with HD.  No bleeding, hemoglobin 7.3 this morning.  Continue to monitor   Type II DM with renal complications - [id number] 7.4, acceptable in his age group   Hypertension -on amlodipine, clonidine, Cardura.  Blood pressure acceptable   Dyslipidemia - Continue statins.   Chronic combined CHF/severe pulmonary hypertension/moderate aortic regurgitation - Cardiac meds as below, appears [place], volume management with HD   CAD - No angina.  Continue clopidogrel and statins.   Reported OSA - Unclear if patient on CPAP prior to admission   Prior history of GI bleeding - Asymptomatic.  Scheduled Meds:  amLODipine  10 mg Oral QHS   atorvastatin  20 mg Oral Daily  calcium acetate  667 mg Oral TID WC   Chlorhexidine Gluconate Cloth  6 each Topical Q0600   Chlorhexidine Gluconate Cloth  6 each Topical Q0600   Chlorhexidine Gluconate Cloth  6 each Topical Q0600   cholecalciferol  1,000 Units Oral Daily   cloNIDine  0.1 mg Oral BID   clopidogrel  75 mg Oral Daily   darbepoetin (ARANESP) injection -  DIALYSIS  100 mcg Subcutaneous Q Thu-1800   dexamethasone (DECADRON) injection  6 mg Intravenous Q24H   doxazosin  2 mg Oral QHS   feeding supplement (NEPRO CARB STEADY)  237 mL Oral BID BM   folic acid  1 mg Oral Daily   heparin injection (subcutaneous)  5,000 Units Subcutaneous Q8H   insulin aspart  0-15 Units Subcutaneous TID WC   insulin aspart  0-5 Units Subcutaneous QHS   insulin aspart  3 Units Subcutaneous TID WC   isosorbide-hydrALAZINE  1 tablet Oral TID   multivitamin  1 tablet Oral QHS   pantoprazole  40 mg Oral Daily   polyethylene glycol  17 g Oral Daily   senna-docusate  1 tablet Oral BID   sodium chloride flush  3 mL Intravenous Q12H   traZODone  50 mg Oral QHS   Continuous Infusions:  anticoagulant sodium citrate     ferric gluconate (FERRLECIT) IVPB 250 mg (11/01/22 0900)   PRN Meds:.acetaminophen **OR** acetaminophen, alteplase, alum & mag hydroxide-simeth, anticoagulant sodium citrate, bisacodyl, guaiFENesin-dextromethorphan, heparin, ondansetron **OR** ondansetron (ZOFRAN) IV  Current Outpatient Medications  Medication Instructions   ACCU-CHEK GUIDE test strip USE TO CHECK BLOOD GLUCOSE TWICE A DAY OR AS NEEDED   Accu-Chek Softclix Lancets lancets USED TO CHECK BLOOD GLUCOSE TWICE A DAY OR AS NEEDED   acetaminophen (TYLENOL) 1,000 mg, Oral, 2 times daily   albuterol (VENTOLIN HFA) 108 (90 Base) MCG/ACT inhaler TAKE 2 PUFFS BY MOUTH EVERY 6 HOURS AS NEEDED FOR WHEEZE OR SHORTNESS OF BREATH   amLODipine (NORVASC) 10 MG tablet TAKE 1 TABLET BY MOUTH EVERY DAY   atorvastatin (LIPITOR) 20 MG tablet TAKE 1 TABLET BY MOUTH EVERY DAY   cholecalciferol (VITAMIN D3) 1,000 Units, Oral, Daily   cloNIDine (CATAPRES) 0.3 mg, Oral, 3 times daily   clopidogrel (PLAVIX) 75 MG tablet TAKE 1 TABLET BY MOUTH EVERY DAY   doxazosin (CARDURA) 2 mg, Oral, Daily at bedtime   esomeprazole (NEXIUM) 40 MG capsule TAKE 1 CAPSULE BY MOUTH EVERY DAY   eszopiclone (LUNESTA) 1 mg, Oral, At  bedtime PRN, Take immediately before bedtime   guaiFENesin (MUCINEX) 600 mg, Oral, Daily at bedtime   hydrocortisone (ANUSOL-HC) 2.5 % rectal cream 1 application , Rectal, 2 times daily   isosorbide-hydrALAZINE (BIDIL) 20-37.5 MG tablet 1 tablet, Oral, 3 times daily   lactulose (CHRONULAC) 10 GM/15ML solution TAKE 15ML BY MOUTH 2 TIMES DAILY AS NEEDED FOR MILD CONSTIPATION.   Melatonin 10 mg, Oral, Daily at bedtime   methylcellulose (CITRUCEL) oral powder 1 tablespoon every 2-3 days,alternating with lactulose   metolazone (ZAROXOLYN) 5 mg, Oral, Weekly   metoprolol succinate (TOPROL-XL) 100 mg, Oral, Daily, Take with or immediately following a meal.   neomycin-polymyxin b-dexamethasone (MAXITROL) 3.5-10000-0.1 SUSP 1 drop, Both Eyes, 4 times daily   oxyCODONE-acetaminophen (PERCOCET/ROXICET) 5-325 MG tablet 1 tablet, Oral, Every 6 hours PRN   potassium chloride (KLOR-CON) 10 MEQ tablet 20 mEq, Oral, 2 times daily   potassium chloride SA (KLOR-CON M) 20 MEQ tablet 20 mEq, Oral, 2 times daily   Propylene Glycol (SYSTANE BALANCE  OP) 1 drop, Both Eyes, Daily at bedtime   torsemide (DEMADEX) 40 mg, Oral, 2 times daily   triamcinolone cream (KENALOG) 0.1 % 1 application , Topical, Daily PRN    Diet Orders (From admission, onward)     Start     Ordered   10/28/22 1608  Diet regular Room service appropriate? Yes; Fluid consistency: Thin; Fluid restriction: 1200 mL Fluid  Diet effective now       Comments: No salt packet  Question Answer Comment  Room service appropriate? Yes   Fluid consistency: Thin   Fluid restriction: 1200 mL Fluid      10/28/22 1609            DVT prophylaxis: heparin injection 5,000 Units Start: 10/27/22 1500   Lab Results  Component Value Date   PLT 264 11/03/2022      Code Status: Full Code  Family Communication: no family at bedside   Status is: Inpatient  Remains inpatient appropriate because: Covid  Level of care: Med-Surg  Consultants:   Nephrology   Objective: Vitals:   11/03/22 0430 11/03/22 0845 11/03/22 0900 11/03/22 0912  BP: (!) 133/48 (!) 133/48 (!) 120/50 (!) 110/48  Pulse: 60 61    Resp: 17 17    Temp: 98.8 F (37.1 C) 99.2 F (37.3 C)    TempSrc: Oral Oral    SpO2: 96% 99%    Weight:      Height:        Intake/Output Summary (Last 24 hours) at 11/03/2022 1136 Last data filed at 11/03/2022 0127 Gross per 24 hour  Intake 480 ml  Output 250 ml  Net 230 ml    Wt Readings from Last 3 Encounters:  11/01/22 76.2 kg  10/18/22 79.4 kg  09/30/22 79.4 kg    Examination:  Constitutional: NAD Eyes: lids and conjunctivae normal, no scleral icterus ENMT: mmm Neck: normal, supple Respiratory: clear to auscultation bilaterally, no wheezing, no crackles. Normal respiratory effort.  Cardiovascular: Regular rate and rhythm, no murmurs / rubs / gallops. No LE edema. Abdomen: soft, no distention, no tenderness. Bowel sounds positive.  Skin: no rashes Neurologic: no focal deficits, equal strength   Data Reviewed: I have independently reviewed following labs and imaging studies   CBC Recent Labs  Lab 10/30/22 0445 10/31/22 0619 11/01/22 0216 11/02/22 0236 11/03/22 0337  WBC 9.5 12.5* 12.8* 14.6* 12.8*  HGB 8.3* 7.7* 7.0* 8.1* 7.3*  HCT 24.7* 22.7* 20.9* 24.5* 22.3*  PLT 203 249 257 270 264  MCV 82.1 81.4 81.6 81.9 82.6  MCH 27.6 27.6 27.3 27.1 27.0  MCHC 33.6 33.9 33.5 33.1 32.7  RDW 13.8 13.8 13.9 14.2 14.8     Recent Labs  Lab 10/27/22 1241 10/28/22 0039 10/29/22 0510 10/29/22 0511 10/30/22 0445 10/31/22 0619 11/01/22 0216 11/02/22 0236 11/03/22 0337  NA 132*  --  130*  --   --   --  132*  --  132*  K 4.0  --  3.9  --   --   --  4.2  --  4.4  CL 97*  --  94*  --   --   --  92*  --  95*  CO2 21*  --  24  --   --   --  25  --  27  GLUCOSE 268*  --  146*  --   --   --  129*  --  193*  BUN 120*  --  96*  --   --   --  98*  --  90*  CREATININE 6.75*  --  5.52*  --   --   --  5.97*  --   5.50*  CALCIUM 8.2*  --  8.0*  --   --   --  8.7*  --  8.8*  AST  --   --   --   --   --   --   --   --  20  ALT  --   --   --   --   --   --   --   --  11  ALKPHOS  --   --   --   --   --   --   --   --  57  BILITOT  --   --   --   --   --   --   --   --  0.8  ALBUMIN 2.5*  --  2.5*  --   --   --  2.5*  --  2.6*  MG  --   --   --   --   --   --   --   --  2.0  CRP  --    < >  --  3.5* 2.5* 2.2* 1.1* 0.9  --   DDIMER  --    < >  --  2.32* 3.00* 3.86* 3.53* 5.45*  --    < > = values in this interval not displayed.     ------------------------------------------------------------------------------------------------------------------ No results for input(s): "CHOL", "HDL", "LDLCALC", "TRIG", "CHOLHDL", "LDLDIRECT" in the last 72 hours.  Lab Results  Component Value Date   HGBA1C 7.4 (H) 10/25/2022   ------------------------------------------------------------------------------------------------------------------ No results for input(s): "TSH", "T4TOTAL", "T3FREE", "THYROIDAB" in the last 72 hours.  Invalid input(s): "FREET3"  Cardiac Enzymes No results for input(s): "CKMB", "TROPONINI", "MYOGLOBIN" in the last 168 hours.  Invalid input(s): "CK" ------------------------------------------------------------------------------------------------------------------    Component Value Date/Time   BNP 2,179.9 (H) 10/25/2022 0555    CBG: Recent Labs  Lab 11/02/22 1607 11/02/22 2052 11/02/22 2355 11/03/22 0432 11/03/22 0827  GLUCAP 319* 187* 198* 173* 166*     Recent Results (from the past 240 hour(s))  Resp panel by RT-PCR (RSV, Flu A&B, Covid) Anterior Nasal Swab     Status: Abnormal   Collection Time: 10/25/22  5:46 AM   Specimen: Anterior Nasal Swab  Result Value Ref Range Status   SARS Coronavirus 2 by RT PCR POSITIVE (A) NEGATIVE Final   Influenza A by PCR NEGATIVE NEGATIVE Final   Influenza B by PCR NEGATIVE NEGATIVE Final    Comment: (NOTE) The Xpert Xpress  SARS-CoV-2/FLU/RSV plus assay is intended as an aid in the diagnosis of influenza from Nasopharyngeal swab specimens and should not be used as a sole basis for treatment. Nasal washings and aspirates are unacceptable for Xpert Xpress SARS-CoV-2/FLU/RSV testing.  Fact Sheet for Patients: EntrepreneurPulse.com.au  Fact Sheet for Healthcare Providers: IncredibleEmployment.be  This test is not yet approved or cleared by the [place] FDA and has been authorized for detection and/or diagnosis of SARS-CoV-2 by FDA under an Emergency Use Authorization (EUA). This EUA will remain in effect (meaning this test can be used) for the duration of the COVID-19 declaration under Section 564(b)(1) of the Act, 21 U.S.C. section 360bbb-3(b)(1), unless the authorization is terminated or revoked.     Resp Syncytial Virus by PCR NEGATIVE NEGATIVE Final    Comment: (NOTE) Fact Sheet for Patients: EntrepreneurPulse.com.au  Fact Sheet for  Healthcare Providers: IncredibleEmployment.be  This test is not yet approved or cleared by the [place] and has been authorized for detection and/or diagnosis of SARS-CoV-2 by FDA under an Emergency Use Authorization (EUA). This EUA will remain in effect (meaning this test can be used) for the duration of the COVID-19 declaration under Section 564(b)(1) of the Act, 21 U.S.C. section 360bbb-3(b)(1), unless the authorization is terminated or revoked.  Performed at [facility], [place] [address], [place], [place] [address]   Blood Culture (routine x 2)     Status: None   Collection Time: 10/25/22  6:10 AM   Specimen: BLOOD RIGHT HAND  Result Value Ref Range Status   Specimen Description BLOOD RIGHT HAND  Final   Special Requests   Final    BOTTLES DRAWN AEROBIC AND ANAEROBIC Blood Culture adequate volume   Culture   Final    NO GROWTH 5 DAYS Performed at [place], [place] [address] [place], [place] [address]    Report Status 10/30/2022 FINAL  Final  Blood Culture (routine x 2)     Status: None   Collection Time: 10/25/22  6:15 AM   Specimen: BLOOD RIGHT HAND  Result Value Ref Range Status   Specimen Description BLOOD RIGHT HAND  Final   Special Requests   Final    BOTTLES DRAWN AEROBIC AND ANAEROBIC Blood Culture results may not be optimal due to an inadequate volume of blood received in culture bottles   Culture   Final    NO GROWTH 5 DAYS Performed at [facility], [place] [address], [place], [place] [address]    Report Status 10/30/2022 FINAL  Final     Radiology Studies: No results found.   Marzetta Board, MD, PhD [facility]  Between 7 am - 7 pm I am available, please contact me via Amion (for emergencies) or Securechat (non urgent messages)  Between 7 pm - 7 am I am not available, please contact night coverage MD/APP via Amion

## 2022-11-03 NOTE — Progress Notes (Signed)
Pt's case discussed with CSW. Pt for snf placement at [facility] and pt may d/c on Saturday if snf bed available. CSW confirmed with Ritta Slot that snf can accommodate [facility] on MWF 12:40 chair time (pt will need to arrive at 11:40 for first appt to complete paperwork prior to treatment). Contacted [place] to confirm clinic can start pt on Monday should pt d/c this weekend. Clinic can start pt on Monday and there will be no specific needs/instructions due to pt recent covid positive status. Update provided to nephrologist due to pt currently being on a TTS schedule and pt will be MWF at d/c. Will add out-pt HD arrangements to AVS. Will assist as needed.  Melven Sartorius Renal Navigator [phone]

## 2022-11-03 NOTE — Progress Notes (Signed)
   11/03/22 2103  Vitals  Temp 98.4 F (36.9 C)  Pulse Rate 71  Resp 16  BP (!) 154/54  SpO2 100 %  O2 Device Room Air  Weight  (unable to weigh)  Type of Weight Post-Dialysis  Oxygen Therapy  Patient Activity (if Appropriate) In bed  Pulse Oximetry Type Continuous  Oximetry Probe Site Changed No  Post Treatment  Dialyzer Clearance Lightly streaked  Duration of HD Treatment -hour(s) 3.75 hour(s)  Hemodialysis Intake (mL) 0 mL  Liters Processed 82  Fluid Removed (mL) 2000 mL  Tolerated HD Treatment Yes   Received patient in bed to unit.  Alert and oriented.  Informed consent signed and in chart.   TX duration: 3.75 Patient tolerated well.  Transported back to the room  Alert, without acute distress.  Hand-off given to patient's nurse.   Access used: [facility] Access issues: no complications  Total UF removed: 2000 Medication(s) given: none   [facility]

## 2022-11-04 DIAGNOSIS — J9601 Acute respiratory failure with hypoxia: Secondary | ICD-10-CM | POA: Diagnosis not present

## 2022-11-04 LAB — CBC
HCT: 24.8 % — ABNORMAL LOW (ref 39.0–52.0)
Hemoglobin: 7.8 g/dL — ABNORMAL LOW (ref 13.0–17.0)
MCH: 26.9 pg (ref 26.0–34.0)
MCHC: 31.5 g/dL (ref 30.0–36.0)
MCV: 85.5 fL (ref 80.0–100.0)
Platelets: 268 10*3/uL (ref 150–400)
RBC: 2.9 MIL/uL — ABNORMAL LOW (ref 4.22–5.81)
RDW: 15.7 % — ABNORMAL HIGH (ref 11.5–15.5)
WBC: 10.8 10*3/uL — ABNORMAL HIGH (ref 4.0–10.5)
nRBC: 1.1 % — ABNORMAL HIGH (ref 0.0–0.2)

## 2022-11-04 NOTE — Progress Notes (Signed)
    Durable Medical Equipment  (From admission, onward)           Start     Ordered   11/04/22 1631  For home use only DME lightweight manual wheelchair with seat cushion  Once       Comments: Patient suffers from generalized weakness which impairs their ability to perform daily activities like grooming, bathing in the home.  A walker will not resolve  issue with performing activities of daily living. A wheelchair will allow patient to safely perform daily activities. Patient is not able to propel themselves in the home using a standard weight wheelchair due to general weakness. Patient can self propel in the lightweight wheelchair. Length of need 12 months . Accessories: elevating leg rests (ELRs), wheel locks, extensions and anti-tippers.   11/04/22 1631

## 2022-11-04 NOTE — Care Management Important Message (Signed)
Important Message  Patient Details  Name: Austin [name] MRN: [id number] Date of Birth: 10/29/1934   Medicare Important Message Given:  Yes     Leeanna Slaby 11/04/2022, 1:10 PM

## 2022-11-04 NOTE — Progress Notes (Addendum)
Pt has been accepted at [facility] on MWF 12:40 chair time. Pt will need to arrive at 11:40 for first appt to complete paperwork prior to treatment. Pt can start at clinic on Monday should pt d/c to snf or home this weekend. Out-pt HD arrangements added to pt's AVS. Contacted renal NP regarding clinic's need for orders should pt d/c to home or snf this weekend.   Melven Sartorius Renal Navigator [phone]  Addendum at 3:09 pm: Spoke to [place] at [facility] planning to start pt on Monday in the event pt stable for d/c this weekend to home or snf. If pt not stable for d/c this weekend, will contact clinic to provide update on Monday.

## 2022-11-04 NOTE — Evaluation (Signed)
Glucose monitor not crossing results over. Results pulled from patient record on monitor.  [id number]

## 2022-11-04 NOTE — Progress Notes (Signed)
PROGRESS NOTE  Austin [name] [id number] DOB: [birth year] DOA: 10/25/2022 PCP: Dorothyann Peng, NP   LOS: 10 days   Brief Narrative / Interim history: 88 year old married male, independent, medical history significant for type II DM, HTN, CAD, prior GI bleed, CHF, sleep apnea, stage V CKD, recent placement of left upper extremity AV fistula on 10/18/2022, presented to the ED on 10/25/2022 with complaints of generalized weakness, not acting like himself, EMS noted fever of 102 F and oxygen saturation in the low 90s.  He was admitted for acute respiratory failure with hypoxia, COVID-19 acute bronchitis, acute kidney injury complicating stage V CKD with early uremia.  Nephrology was consulted, right temporary HD catheter (switched to [facility] on 3/7) was placed and started HD on 2/5.  Improving.  Awaiting SNF placement following which renal navigator will assist with outpatient HD arrangements.  TOC on board.   Subjective / 24h Interval events: No significant complaints, no cough, no shortness of breath  Assesement and Plan: Principal Problem:   Acute respiratory failure with hypoxia (HCC) Active Problems:   Type 2 diabetes mellitus with hyperlipidemia (HCC)   Iron deficiency anemia due to chronic blood loss   Essential hypertension   GERD   Acute kidney injury superimposed on CKD ([place])   Pneumonia due to COVID-19 virus   CKD (chronic kidney disease) stage 5, GFR less than 15 ml/min (HCC)   Severe pulmonary hypertension (HCC)   Moderate aortic regurgitation   CAD in native artery   Protein-calorie malnutrition, severe   ESRD on dialysis [facility])   Principal problem Acute kidney injury complicating stage V CKD, possibly new ESRD with volume overload/metabolic acidosis - S/p LUE AVG placed 2/27.  Nephrology following.  Now on IHD.  He had a temporary HD cath which was removed and replaced by [facility] on 3/7, due to some bleeding complications.  Awaiting SNF placement, renal navigator assisting with  outpatient HD arrangements as well.  Plan to discharge on Saturday, it appears that he has outpatient HD spot  Active problems COVID 19 acute bronchitis/acute respiratory failure with hypoxia - Hypoxia resolved.  Completed Decadron.  Tested positive on 3/5, off precautions today. On admission, did not meet criteria for Actemra.  Improving, currently on room air, he initially tested positive on 3/5 and will need to come off isolation on 3/15.  He was briefly on heparin due to elevated D-dimer, however with low index of suspicion for PE to begin with, negative lower extremity venous Dopplers, bleeding from HD catheter site, and pharmacy recommendations, discontinued anticoagulation.   Acute metabolic encephalopathy- Multifactorial due to ESRD with uremia, COVID-19.  This is now resolved   Anemia due to ESRD -started on iron and ESA with HD.  No bleeding, hemoglobin 7.3 this morning.  Continue to monitor   Type II DM with renal complications - [id number] 7.4, acceptable in his age group   Hypertension -on amlodipine, clonidine, Cardura.  Blood pressure acceptable   Dyslipidemia - Continue statins.   Chronic combined CHF/severe pulmonary hypertension/moderate aortic regurgitation - Cardiac meds as below, appears Evola, volume management with HD   CAD - No angina.  Continue clopidogrel and statins.   Reported OSA - Unclear if patient on CPAP prior to admission   Prior history of GI bleeding - Asymptomatic.  Scheduled Meds:  amLODipine  10 mg Oral QHS   atorvastatin  20 mg Oral Daily   calcium acetate  667 mg Oral TID WC   Chlorhexidine Gluconate Cloth  6 each Topical Q0600  Chlorhexidine Gluconate Cloth  6 each Topical Q0600   Chlorhexidine Gluconate Cloth  6 each Topical Q0600   cholecalciferol  1,000 Units Oral Daily   cloNIDine  0.1 mg Oral BID   clopidogrel  75 mg Oral Daily   darbepoetin (ARANESP) injection - DIALYSIS  100 mcg Subcutaneous Q Thu-1800   doxazosin  2 mg Oral QHS   feeding  supplement (NEPRO CARB STEADY)  237 mL Oral BID BM   folic acid  1 mg Oral Daily   heparin injection (subcutaneous)  5,000 Units Subcutaneous Q8H   insulin aspart  0-15 Units Subcutaneous TID WC   insulin aspart  0-5 Units Subcutaneous QHS   insulin aspart  3 Units Subcutaneous TID WC   isosorbide-hydrALAZINE  1 tablet Oral TID   multivitamin  1 tablet Oral QHS   pantoprazole  40 mg Oral Daily   polyethylene glycol  17 g Oral Daily   senna-docusate  1 tablet Oral BID   sodium chloride flush  3 mL Intravenous Q12H   traZODone  50 mg Oral QHS   Continuous Infusions:  anticoagulant sodium citrate     ferric gluconate (FERRLECIT) IVPB 250 mg (11/01/22 0900)   PRN Meds:.acetaminophen **OR** acetaminophen, alteplase, alum & mag hydroxide-simeth, anticoagulant sodium citrate, bisacodyl, guaiFENesin-dextromethorphan, heparin, ondansetron **OR** ondansetron (ZOFRAN) IV  Current Outpatient Medications  Medication Instructions   ACCU-CHEK GUIDE test strip USE TO CHECK BLOOD GLUCOSE TWICE A DAY OR AS NEEDED   Accu-Chek Softclix Lancets lancets USED TO CHECK BLOOD GLUCOSE TWICE A DAY OR AS NEEDED   acetaminophen (TYLENOL) 1,000 mg, Oral, 2 times daily   albuterol (VENTOLIN HFA) 108 (90 Base) MCG/ACT inhaler TAKE 2 PUFFS BY MOUTH EVERY 6 HOURS AS NEEDED FOR WHEEZE OR SHORTNESS OF BREATH   amLODipine (NORVASC) 10 MG tablet TAKE 1 TABLET BY MOUTH EVERY DAY   atorvastatin (LIPITOR) 20 MG tablet TAKE 1 TABLET BY MOUTH EVERY DAY   cholecalciferol (VITAMIN D3) 1,000 Units, Oral, Daily   cloNIDine (CATAPRES) 0.3 mg, Oral, 3 times daily   clopidogrel (PLAVIX) 75 MG tablet TAKE 1 TABLET BY MOUTH EVERY DAY   doxazosin (CARDURA) 2 mg, Oral, Daily at bedtime   esomeprazole (NEXIUM) 40 MG capsule TAKE 1 CAPSULE BY MOUTH EVERY DAY   eszopiclone (LUNESTA) 1 mg, Oral, At bedtime PRN, Take immediately before bedtime   guaiFENesin (MUCINEX) 600 mg, Oral, Daily at bedtime   hydrocortisone (ANUSOL-HC) 2.5 % rectal  cream 1 application , Rectal, 2 times daily   isosorbide-hydrALAZINE (BIDIL) 20-37.5 MG tablet 1 tablet, Oral, 3 times daily   lactulose (CHRONULAC) 10 GM/15ML solution TAKE 15ML BY MOUTH 2 TIMES DAILY AS NEEDED FOR MILD CONSTIPATION.   Melatonin 10 mg, Oral, Daily at bedtime   methylcellulose (CITRUCEL) oral powder 1 tablespoon every 2-3 days,alternating with lactulose   metolazone (ZAROXOLYN) 5 mg, Oral, Weekly   metoprolol succinate (TOPROL-XL) 100 mg, Oral, Daily, Take with or immediately following a meal.   neomycin-polymyxin b-dexamethasone (MAXITROL) 3.5-10000-0.1 SUSP 1 drop, Both Eyes, 4 times daily   oxyCODONE-acetaminophen (PERCOCET/ROXICET) 5-325 MG tablet 1 tablet, Oral, Every 6 hours PRN   potassium chloride (KLOR-CON) 10 MEQ tablet 20 mEq, Oral, 2 times daily   potassium chloride SA (KLOR-CON M) 20 MEQ tablet 20 mEq, Oral, 2 times daily   Propylene Glycol (SYSTANE BALANCE OP) 1 drop, Both Eyes, Daily at bedtime   torsemide (DEMADEX) 40 mg, Oral, 2 times daily   triamcinolone cream (KENALOG) 0.1 % 1 application , Topical, Daily  PRN    Diet Orders (From admission, onward)     Start     Ordered   10/28/22 1608  Diet regular Room service appropriate? Yes; Fluid consistency: Thin; Fluid restriction: 1200 mL Fluid  Diet effective now       Comments: No salt packet  Question Answer Comment  Room service appropriate? Yes   Fluid consistency: Thin   Fluid restriction: 1200 mL Fluid      10/28/22 1609            DVT prophylaxis: heparin injection 5,000 Units Start: 10/27/22 1500   Lab Results  Component Value Date   PLT 268 11/04/2022      Code Status: Full Code  Family Communication: no family at bedside   Status is: Inpatient  Remains inpatient appropriate because: Covid  Level of care: Med-Surg  Consultants:  Nephrology   Objective: Vitals:   11/03/22 2103 11/03/22 2128 11/04/22 0504 11/04/22 0817  BP: (!) 154/54 (!) 154/65 (!) 136/52 (!) 147/51   Pulse: 71 63 68 74  Resp: 16 17 17 15   Temp: 98.4 F (36.9 C) 98.9 F (37.2 C)  99 F (37.2 C)  TempSrc:  Oral  Oral  SpO2: 100% 100% 96% 100%  Height:        Intake/Output Summary (Last 24 hours) at 11/04/2022 1144 Last data filed at 11/04/2022 0050 Gross per 24 hour  Intake --  Output 2400 ml  Net -2400 ml    Wt Readings from Last 3 Encounters:  10/18/22 79.4 kg  09/30/22 79.4 kg  09/30/22 80.6 kg    Examination:  Constitutional: NAD Eyes: lids and conjunctivae normal, no scleral icterus ENMT: mmm Neck: normal, supple Respiratory: clear to auscultation bilaterally, no wheezing, no crackles. Normal respiratory effort.  Cardiovascular: Regular rate and rhythm, no murmurs / rubs / gallops. No LE edema. Abdomen: soft, no distention, no tenderness. Bowel sounds positive.  Skin: no rashes Neurologic: no focal deficits, equal strength   Data Reviewed: I have independently reviewed following labs and imaging studies   CBC Recent Labs  Lab 10/31/22 0619 11/01/22 0216 11/02/22 0236 11/03/22 0337 11/04/22 0646  WBC 12.5* 12.8* 14.6* 12.8* 10.8*  HGB 7.7* 7.0* 8.1* 7.3* 7.8*  HCT 22.7* 20.9* 24.5* 22.3* 24.8*  PLT 249 257 270 264 268  MCV 81.4 81.6 81.9 82.6 85.5  MCH 27.6 27.3 27.1 27.0 26.9  MCHC 33.9 33.5 33.1 32.7 31.5  RDW 13.8 13.9 14.2 14.8 15.7*     Recent Labs  Lab 10/29/22 0510 10/29/22 0511 10/30/22 0445 10/31/22 0619 11/01/22 0216 11/02/22 0236 11/03/22 0337  NA 130*  --   --   --  132*  --  132*  K 3.9  --   --   --  4.2  --  4.4  CL 94*  --   --   --  92*  --  95*  CO2 24  --   --   --  25  --  27  GLUCOSE 146*  --   --   --  129*  --  193*  BUN 96*  --   --   --  98*  --  90*  CREATININE 5.52*  --   --   --  5.97*  --  5.50*  CALCIUM 8.0*  --   --   --  8.7*  --  8.8*  AST  --   --   --   --   --   --  20  ALT  --   --   --   --   --   --  11  ALKPHOS  --   --   --   --   --   --  57  BILITOT  --   --   --   --   --   --  0.8   ALBUMIN 2.5*  --   --   --  2.5*  --  2.6*  MG  --   --   --   --   --   --  2.0  CRP  --  3.5* 2.5* 2.2* 1.1* 0.9  --   DDIMER  --  2.32* 3.00* 3.86* 3.53* 5.45*  --      ------------------------------------------------------------------------------------------------------------------ No results for input(s): "CHOL", "HDL", "LDLCALC", "TRIG", "CHOLHDL", "LDLDIRECT" in the last 72 hours.  Lab Results  Component Value Date   HGBA1C 7.4 (H) 10/25/2022   ------------------------------------------------------------------------------------------------------------------ No results for input(s): "TSH", "T4TOTAL", "T3FREE", "THYROIDAB" in the last 72 hours.  Invalid input(s): "FREET3"  Cardiac Enzymes No results for input(s): "CKMB", "TROPONINI", "MYOGLOBIN" in the last 168 hours.  Invalid input(s): "CK" ------------------------------------------------------------------------------------------------------------------    Component Value Date/Time   BNP 2,179.9 (H) 10/25/2022 0555    CBG: Recent Labs  Lab 11/02/22 2052 11/02/22 2355 11/03/22 0432 11/03/22 0827 11/03/22 1200  GLUCAP 187* 198* 173* 166* 212*     No results found for this or any previous visit (from the past 240 hour(s)).    Radiology Studies: No results found.   Marzetta Board, MD, PhD [facility]  Between 7 am - 7 pm I am available, please contact me via Amion (for emergencies) or Securechat (non urgent messages)  Between 7 pm - 7 am I am not available, please contact night coverage MD/APP via Amion

## 2022-11-04 NOTE — Progress Notes (Signed)
Physical Therapy Treatment Patient Details Name: Austin [name] MRN: [id number] DOB: 1935/06/27 Today's Date: 11/04/2022   History of Present Illness Pt is 88 year old presented to [facility] on 10/25/22 with acute hypoxic respiratory failure due to acute Covid. Pt with continued progression of ckd and temporary HD cath placed and HD intitiated. PMH: ckd with impending HD, diastolic CHF, DM2 HTN, HLD, prostate CA hx    PT Comments    Pt was seen for progression to sit up from bed and then walk with a great deal more help today.  He is beginning HD and may be feeling more tired with the treatment.  Has been seen in attendance with his wife, and requiring mod assist to get OOB and back.  Pt is sitting in chair posture in bed, declining to sit in chair for his lunch.  Follow up with him to encourage longer walks and greater time OOB as tolerated.  HD is scheduled tomorrow. Recommended SNF care due to his depth of assistance with bed mob and lethargic presentation but will try to move on the weekend.   Recommendations for follow up therapy are one component of a multi-disciplinary discharge planning process, led by the attending physician.  Recommendations may be updated based on patient status, additional functional criteria and insurance authorization.  Follow Up Recommendations  Skilled nursing-short term rehab (<3 hours/day) Can patient physically be transported by private vehicle: Yes   Assistance Recommended at Discharge Frequent or constant Supervision/Assistance  Patient can return home with the following A little help with walking and/or transfers;A little help with bathing/dressing/bathroom;Assistance with cooking/housework;Help with stairs or ramp for entrance;Assist for transportation   Equipment Recommendations  Wheelchair cushion (measurements PT);Wheelchair (measurements PT)    Recommendations for Other Services       Precautions / Restrictions Precautions Precautions:  Fall Restrictions Weight Bearing Restrictions: No     Mobility  Bed Mobility Overal bed mobility: Needs Assistance Bed Mobility: Supine to Sit, Sit to Supine     Supine to sit: Mod assist Sit to supine: Mod assist   General bed mobility comments: mod with bed pad used for OOB    Transfers Overall transfer level: Needs assistance Equipment used: Rolling walker (2 wheels) Transfers: Sit to/from Stand Sit to Stand: Min assist           General transfer comment: increased support and time to stand, pt's wife in attendance    Ambulation/Gait Ambulation/Gait assistance: Min guard, Min assist Gait Distance (Feet): 80 Feet Assistive device: Rolling walker (2 wheels) Gait Pattern/deviations: Step-to pattern, Step-through pattern, Decreased stride length, Wide base of support (wide turning radius and extra help to pivot walker) Gait velocity: reduced Gait velocity interpretation: <1.31 ft/sec, indicative of household ambulator   General Gait Details: pt is struggling to move the walker   Stairs             Wheelchair Mobility    Modified Rankin (Stroke Patients Only)       Balance Overall balance assessment: Needs assistance Sitting-balance support: Feet supported Sitting balance-Leahy Scale: Fair     Standing balance support: Bilateral upper extremity supported, During functional activity Standing balance-Leahy Scale: Poor                              Cognition Arousal/Alertness: Lethargic Behavior During Therapy: Flat affect Overall Cognitive Status: Difficult to assess  Exercises      General Comments General comments (skin integrity, edema, etc.): Pt is struggling to have energy and endurance for gait, sleepy and weaker than last PT visit      Pertinent Vitals/Pain Pain Assessment Pain Assessment: Faces Faces Pain Scale: No hurt    Home Living                           Prior Function            PT Goals (current goals can now be found in the care plan section) Acute Rehab PT Goals Patient Stated Goal: return home    Frequency    Min 3X/week      PT Plan Current plan remains appropriate    Co-evaluation              AM-PAC PT "6 Clicks" Mobility   Outcome Measure  Help needed turning from your back to your side while in a flat bed without using bedrails?: A Lot Help needed moving from lying on your back to sitting on the side of a flat bed without using bedrails?: A Lot Help needed moving to and from a bed to a chair (including a wheelchair)?: A Lot Help needed standing up from a chair using your arms (e.g., wheelchair or bedside chair)?: A Little Help needed to walk in hospital room?: A Little Help needed climbing 3-5 steps with a railing? : Total 6 Click Score: 13    End of Session Equipment Utilized During Treatment: Gait belt Activity Tolerance: Patient limited by fatigue Patient left: with call bell/phone within reach;in bed;with bed alarm set;with family/visitor present Nurse Communication: Mobility status PT Visit Diagnosis: Other abnormalities of gait and mobility (R26.89);Muscle weakness (generalized) (M62.81)     Time: [id number] PT Time Calculation (min) (ACUTE ONLY): 28 min  Charges:  $Gait Training: 8-22 mins $Therapeutic Activity: 8-22 mins       Ramond Dial 11/04/2022, 3:52 PM  Mee Hives, PT PhD Acute Rehab Dept. Number: [facility]

## 2022-11-04 NOTE — Progress Notes (Signed)
Per [employer] auth request is under Medical Director review at this time. There is a possibility pt will be denied for SNF due to ambulating 280' with PT on 3/13. CM aware of potential need for [facility]. SW will provide updates as available.   Wandra Feinstein, MSW, LCSW [phone] (coverage)

## 2022-11-04 NOTE — Progress Notes (Signed)
CBG results not crossing over, results retrieved from patient record on monitor.   [id number]-[id number]

## 2022-11-04 NOTE — Progress Notes (Signed)
Patient ID: Austin [name], male   DOB: 05-May-1935, 88 y.o.   MRN: [id number] S: No new complaints O:BP (!) 147/51 (BP Location: Right Arm)   Pulse 74   Temp 99 F (37.2 C) (Oral)   Resp 15   Ht 5\' 7"  (1.702 m)   Wt 76.2 kg   SpO2 100%   BMI 26.31 kg/m   Intake/Output Summary (Last 24 hours) at 11/04/2022 1355 Last data filed at 11/04/2022 0050 Gross per 24 hour  Intake --  Output 2200 ml  Net -2200 ml   Intake/Output: I/O last 3 completed shifts: In: -  Out: 2650 [Urine:650; Other:2000]  Intake/Output this shift:  No intake/output data recorded. Weight change:  Gen: NAD CVS: RRR Resp: occ rhonchi Abd:+BS, soft, NT/ND Ext: +1 pretibial edema, LUE AVG +T/B  Recent Labs  Lab 10/29/22 0510 11/01/22 0216 11/03/22 0337  NA 130* 132* 132*  K 3.9 4.2 4.4  CL 94* 92* 95*  CO2 24 25 27   GLUCOSE 146* 129* 193*  BUN 96* 98* 90*  CREATININE 5.52* 5.97* 5.50*  ALBUMIN 2.5* 2.5* 2.6*  CALCIUM 8.0* 8.7* 8.8*  PHOS 5.8* 5.1*  --   AST  --   --  20  ALT  --   --  11   Liver Function Tests: Recent Labs  Lab 10/29/22 0510 11/01/22 0216 11/03/22 0337  AST  --   --  20  ALT  --   --  11  ALKPHOS  --   --  57  BILITOT  --   --  0.8  PROT  --   --  5.4*  ALBUMIN 2.5* 2.5* 2.6*   No results for input(s): "LIPASE", "AMYLASE" in the last 168 hours. No results for input(s): "AMMONIA" in the last 168 hours. CBC: Recent Labs  Lab 10/31/22 0619 11/01/22 0216 11/02/22 0236 11/03/22 0337 11/04/22 0646  WBC 12.5* 12.8* 14.6* 12.8* 10.8*  HGB 7.7* 7.0* 8.1* 7.3* 7.8*  HCT 22.7* 20.9* 24.5* 22.3* 24.8*  MCV 81.4 81.6 81.9 82.6 85.5  PLT 249 257 270 264 268   Cardiac Enzymes: No results for input(s): "CKTOTAL", "CKMB", "CKMBINDEX", "TROPONINI" in the last 168 hours. CBG: Recent Labs  Lab 11/02/22 2052 11/02/22 2355 11/03/22 0432 11/03/22 0827 11/03/22 1200  GLUCAP 187* 198* 173* 166* 212*    Iron Studies: No results for input(s): "IRON", "TIBC", "TRANSFERRIN",  "FERRITIN" in the last 72 hours. Studies/Results: No results found.  amLODipine  10 mg Oral QHS   atorvastatin  20 mg Oral Daily   calcium acetate  667 mg Oral TID WC   Chlorhexidine Gluconate Cloth  6 each Topical Q0600   Chlorhexidine Gluconate Cloth  6 each Topical Q0600   Chlorhexidine Gluconate Cloth  6 each Topical Q0600   cholecalciferol  1,000 Units Oral Daily   cloNIDine  0.1 mg Oral BID   clopidogrel  75 mg Oral Daily   darbepoetin (ARANESP) injection - DIALYSIS  100 mcg Subcutaneous Q Thu-1800   doxazosin  2 mg Oral QHS   feeding supplement (NEPRO CARB STEADY)  237 mL Oral BID BM   folic acid  1 mg Oral Daily   heparin injection (subcutaneous)  5,000 Units Subcutaneous Q8H   insulin aspart  0-15 Units Subcutaneous TID WC   insulin aspart  0-5 Units Subcutaneous QHS   insulin aspart  3 Units Subcutaneous TID WC   isosorbide-hydrALAZINE  1 tablet Oral TID   multivitamin  1 tablet Oral QHS   pantoprazole  40 mg Oral Daily   polyethylene glycol  17 g Oral Daily   senna-docusate  1 tablet Oral BID   sodium chloride flush  3 mL Intravenous Q12H   traZODone  50 mg Oral QHS    BMET    Component Value Date/Time   NA 132 (L) 11/03/2022 0337   NA 138 12/02/2019 0000   K 4.4 11/03/2022 0337   CL 95 (L) 11/03/2022 0337   CO2 27 11/03/2022 0337   GLUCOSE 193 (H) 11/03/2022 0337   GLUCOSE 112 (H) 06/19/2006 0948   BUN 90 (H) 11/03/2022 0337   BUN 29 (A) 12/02/2019 0000   CREATININE 5.50 (H) 11/03/2022 0337   CREATININE 3.59 (H) 03/03/2020 1454   CALCIUM 8.8 (L) 11/03/2022 0337   GFRNONAA 9 (L) 11/03/2022 0337   GFRNONAA 20 (L) 08/19/2019 1107   GFRAA 19 (L) 02/26/2020 0724   GFRAA 23 (L) 08/19/2019 1107   CBC    Component Value Date/Time   WBC 10.8 (H) 11/04/2022 0646   RBC 2.90 (L) 11/04/2022 0646   HGB 7.8 (L) 11/04/2022 0646   HGB 9.7 (L) 08/19/2019 1107   HCT 24.8 (L) 11/04/2022 0646   PLT 268 11/04/2022 0646   PLT 262 08/19/2019 1107   MCV 85.5 11/04/2022  0646   MCH 26.9 11/04/2022 0646   MCHC 31.5 11/04/2022 0646   RDW 15.7 (H) 11/04/2022 0646   LYMPHSABS 0.1 (L) 10/25/2022 0555   MONOABS 0.4 10/25/2022 0555   EOSABS 0.0 10/25/2022 0555   BASOSABS 0.0 10/25/2022 0555     Assessment/Plan:  New ESRD - first HD 10/25/22, then 3/7, 3/9, 3/12, and 3/14.  Tolerating HD well and awaiting disposition before can arrange for outpatient dialysis.  Plan for HD tomorrow but may need to go to MWF schedule if accepted at [facility] per renal navigator notes. Acute encephalopathy - multifactorial with covid infection and uremia.  Slowly improving. Covid+ - per primary svc Anemia of ESRD - IV iron and ESA CKD-BMD - started on binders, iPTH pending Vascular access - RIJ TDC placed 10/27/22 by IR.  LUE AVG created 10/18/22 by Dr. Virl Cagey. Disposition - pending discharge placement then will need to arrange for outpatient dialysis.   Donetta Potts, MD [facility]

## 2022-11-05 DIAGNOSIS — I120 Hypertensive chronic kidney disease with stage 5 chronic kidney disease or end stage renal disease: Secondary | ICD-10-CM | POA: Diagnosis not present

## 2022-11-05 DIAGNOSIS — G9340 Encephalopathy, unspecified: Secondary | ICD-10-CM | POA: Diagnosis not present

## 2022-11-05 DIAGNOSIS — I509 Heart failure, unspecified: Secondary | ICD-10-CM | POA: Diagnosis not present

## 2022-11-05 DIAGNOSIS — E785 Hyperlipidemia, unspecified: Secondary | ICD-10-CM | POA: Diagnosis not present

## 2022-11-05 DIAGNOSIS — K5900 Constipation, unspecified: Secondary | ICD-10-CM | POA: Diagnosis not present

## 2022-11-05 DIAGNOSIS — I5032 Chronic diastolic (congestive) heart failure: Secondary | ICD-10-CM | POA: Diagnosis not present

## 2022-11-05 DIAGNOSIS — J9601 Acute respiratory failure with hypoxia: Secondary | ICD-10-CM | POA: Diagnosis not present

## 2022-11-05 DIAGNOSIS — N179 Acute kidney failure, unspecified: Secondary | ICD-10-CM | POA: Diagnosis not present

## 2022-11-05 DIAGNOSIS — K922 Gastrointestinal hemorrhage, unspecified: Secondary | ICD-10-CM | POA: Diagnosis not present

## 2022-11-05 DIAGNOSIS — D500 Iron deficiency anemia secondary to blood loss (chronic): Secondary | ICD-10-CM | POA: Diagnosis not present

## 2022-11-05 DIAGNOSIS — E119 Type 2 diabetes mellitus without complications: Secondary | ICD-10-CM | POA: Diagnosis not present

## 2022-11-05 DIAGNOSIS — E43 Unspecified severe protein-calorie malnutrition: Secondary | ICD-10-CM | POA: Diagnosis not present

## 2022-11-05 DIAGNOSIS — R262 Difficulty in walking, not elsewhere classified: Secondary | ICD-10-CM | POA: Diagnosis not present

## 2022-11-05 DIAGNOSIS — J189 Pneumonia, unspecified organism: Secondary | ICD-10-CM | POA: Diagnosis not present

## 2022-11-05 DIAGNOSIS — K219 Gastro-esophageal reflux disease without esophagitis: Secondary | ICD-10-CM | POA: Diagnosis not present

## 2022-11-05 DIAGNOSIS — I5043 Acute on chronic combined systolic (congestive) and diastolic (congestive) heart failure: Secondary | ICD-10-CM | POA: Diagnosis not present

## 2022-11-05 DIAGNOSIS — I2510 Atherosclerotic heart disease of native coronary artery without angina pectoris: Secondary | ICD-10-CM | POA: Diagnosis not present

## 2022-11-05 DIAGNOSIS — N185 Chronic kidney disease, stage 5: Secondary | ICD-10-CM | POA: Diagnosis not present

## 2022-11-05 DIAGNOSIS — M6281 Muscle weakness (generalized): Secondary | ICD-10-CM | POA: Diagnosis not present

## 2022-11-05 DIAGNOSIS — J1282 Pneumonia due to coronavirus disease 2019: Secondary | ICD-10-CM | POA: Diagnosis not present

## 2022-11-05 DIAGNOSIS — Z741 Need for assistance with personal care: Secondary | ICD-10-CM | POA: Diagnosis not present

## 2022-11-05 DIAGNOSIS — I5040 Unspecified combined systolic (congestive) and diastolic (congestive) heart failure: Secondary | ICD-10-CM | POA: Diagnosis not present

## 2022-11-05 DIAGNOSIS — E1169 Type 2 diabetes mellitus with other specified complication: Secondary | ICD-10-CM | POA: Diagnosis not present

## 2022-11-05 DIAGNOSIS — Z7401 Bed confinement status: Secondary | ICD-10-CM | POA: Diagnosis not present

## 2022-11-05 DIAGNOSIS — R4182 Altered mental status, unspecified: Secondary | ICD-10-CM | POA: Diagnosis not present

## 2022-11-05 DIAGNOSIS — U071 COVID-19: Secondary | ICD-10-CM | POA: Diagnosis not present

## 2022-11-05 DIAGNOSIS — N186 End stage renal disease: Secondary | ICD-10-CM | POA: Diagnosis not present

## 2022-11-05 DIAGNOSIS — R531 Weakness: Secondary | ICD-10-CM | POA: Diagnosis not present

## 2022-11-05 DIAGNOSIS — I350 Nonrheumatic aortic (valve) stenosis: Secondary | ICD-10-CM | POA: Diagnosis not present

## 2022-11-05 DIAGNOSIS — I129 Hypertensive chronic kidney disease with stage 1 through stage 4 chronic kidney disease, or unspecified chronic kidney disease: Secondary | ICD-10-CM | POA: Diagnosis not present

## 2022-11-05 DIAGNOSIS — E1022 Type 1 diabetes mellitus with diabetic chronic kidney disease: Secondary | ICD-10-CM | POA: Diagnosis not present

## 2022-11-05 DIAGNOSIS — I5042 Chronic combined systolic (congestive) and diastolic (congestive) heart failure: Secondary | ICD-10-CM | POA: Diagnosis not present

## 2022-11-05 DIAGNOSIS — I351 Nonrheumatic aortic (valve) insufficiency: Secondary | ICD-10-CM | POA: Diagnosis not present

## 2022-11-05 DIAGNOSIS — D649 Anemia, unspecified: Secondary | ICD-10-CM | POA: Diagnosis not present

## 2022-11-05 DIAGNOSIS — Z992 Dependence on renal dialysis: Secondary | ICD-10-CM | POA: Diagnosis not present

## 2022-11-05 DIAGNOSIS — D509 Iron deficiency anemia, unspecified: Secondary | ICD-10-CM | POA: Diagnosis not present

## 2022-11-05 DIAGNOSIS — I10 Essential (primary) hypertension: Secondary | ICD-10-CM | POA: Diagnosis not present

## 2022-11-05 DIAGNOSIS — I5033 Acute on chronic diastolic (congestive) heart failure: Secondary | ICD-10-CM | POA: Diagnosis not present

## 2022-11-05 DIAGNOSIS — R2681 Unsteadiness on feet: Secondary | ICD-10-CM | POA: Diagnosis not present

## 2022-11-05 LAB — RENAL FUNCTION PANEL
Albumin: 2.8 g/dL — ABNORMAL LOW (ref 3.5–5.0)
Anion gap: 12 (ref 5–15)
BUN: 70 mg/dL — ABNORMAL HIGH (ref 8–23)
CO2: 26 mmol/L (ref 22–32)
Calcium: 9 mg/dL (ref 8.9–10.3)
Chloride: 98 mmol/L (ref 98–111)
Creatinine, Ser: 5.19 mg/dL — ABNORMAL HIGH (ref 0.61–1.24)
GFR, Estimated: 10 mL/min — ABNORMAL LOW (ref >60)
Glucose, Bld: 122 mg/dL — ABNORMAL HIGH (ref 70–99)
Phosphorus: 4.6 mg/dL (ref 2.5–4.6)
Potassium: 4.3 mmol/L (ref 3.5–5.1)
Sodium: 136 mmol/L (ref 135–145)

## 2022-11-05 LAB — CBC
HCT: 26.5 % — ABNORMAL LOW (ref 39.0–52.0)
Hemoglobin: 8.3 g/dL — ABNORMAL LOW (ref 13.0–17.0)
MCH: 27.2 pg (ref 26.0–34.0)
MCHC: 31.3 g/dL (ref 30.0–36.0)
MCV: 86.9 fL (ref 80.0–100.0)
Platelets: 266 10*3/uL (ref 150–400)
RBC: 3.05 MIL/uL — ABNORMAL LOW (ref 4.22–5.81)
RDW: 16.4 % — ABNORMAL HIGH (ref 11.5–15.5)
WBC: 8.9 10*3/uL (ref 4.0–10.5)
nRBC: 0.6 % — ABNORMAL HIGH (ref 0.0–0.2)

## 2022-11-05 LAB — GLUCOSE, CAPILLARY
Glucose-Capillary: 115 mg/dL — ABNORMAL HIGH (ref 70–99)
Glucose-Capillary: 137 mg/dL — ABNORMAL HIGH (ref 70–99)

## 2022-11-05 MED ORDER — TRAZODONE HCL 50 MG PO TABS: 50.0000 mg | ORAL_TABLET | Freq: Every day | ORAL | Status: DC

## 2022-11-05 NOTE — Progress Notes (Signed)
[employer] offering peer-to-peer. Information provided to MD who plans to complete. Will assist as indicated.   Wandra Feinstein, MSW, LCSW [phone] (coverage)

## 2022-11-05 NOTE — Progress Notes (Signed)
Patient ID: Vadhir Tehan, male   DOB: 06-18-1935, 88 y.o.   MRN: [id number] S:  No c/o this AM CLIP MWF 2nd shift at [facility].    O:BP (!) 112/50 (BP Location: Left Wrist)   Pulse 81   Temp 99 F (37.2 C) (Oral)   Resp 17   Ht 5\' 7"  (1.702 m)   Wt 76.2 kg   SpO2 96%   BMI 26.31 kg/m   Intake/Output Summary (Last 24 hours) at 11/05/2022 0714 Last data filed at 11/05/2022 0526 Gross per 24 hour  Intake 390 ml  Output --  Net 390 ml    Intake/Output: I/O last 3 completed shifts: In: 390 [P.O.:120; IV Piggyback:270] Out: 2200 [Urine:200; Other:2000]  Intake/Output this shift:  No intake/output data recorded. Weight change:  Gen: NAD CVS: RRR Resp: occ rhonchi Abd:+BS, soft, NT/ND Ext: +1 pretibial edema, LUE AVG +T/B R IJ TDC bandaged  Recent Labs  Lab 11/01/22 0216 11/03/22 0337 11/05/22 0552  NA 132* 132* 136  K 4.2 4.4 4.3  CL 92* 95* 98  CO2 25 27 26   GLUCOSE 129* 193* 122*  BUN 98* 90* 70*  CREATININE 5.97* 5.50* 5.19*  ALBUMIN 2.5* 2.6* 2.8*  CALCIUM 8.7* 8.8* 9.0  PHOS 5.1*  --  4.6  AST  --  20  --   ALT  --  11  --     Liver Function Tests: Recent Labs  Lab 11/01/22 0216 11/03/22 0337 11/05/22 0552  AST  --  20  --   ALT  --  11  --   ALKPHOS  --  57  --   BILITOT  --  0.8  --   PROT  --  5.4*  --   ALBUMIN 2.5* 2.6* 2.8*    No results for input(s): "LIPASE", "AMYLASE" in the last 168 hours. No results for input(s): "AMMONIA" in the last 168 hours. CBC: Recent Labs  Lab 11/01/22 0216 11/02/22 0236 11/03/22 0337 11/04/22 0646 11/05/22 0552  WBC 12.8* 14.6* 12.8* 10.8* 8.9  HGB 7.0* 8.1* 7.3* 7.8* 8.3*  HCT 20.9* 24.5* 22.3* 24.8* 26.5*  MCV 81.6 81.9 82.6 85.5 86.9  PLT 257 270 264 268 266    Cardiac Enzymes: No results for input(s): "CKTOTAL", "CKMB", "CKMBINDEX", "TROPONINI" in the last 168 hours. CBG: Recent Labs  Lab 11/02/22 2052 11/02/22 2355 11/03/22 0432 11/03/22 0827 11/03/22 1200  GLUCAP 187* 198* 173* 166*  212*     Iron Studies: No results for input(s): "IRON", "TIBC", "TRANSFERRIN", "FERRITIN" in the last 72 hours. Studies/Results: No results found.  amLODipine  10 mg Oral QHS   atorvastatin  20 mg Oral Daily   calcium acetate  667 mg Oral TID WC   Chlorhexidine Gluconate Cloth  6 each Topical Q0600   Chlorhexidine Gluconate Cloth  6 each Topical Q0600   Chlorhexidine Gluconate Cloth  6 each Topical Q0600   cholecalciferol  1,000 Units Oral Daily   cloNIDine  0.1 mg Oral BID   clopidogrel  75 mg Oral Daily   darbepoetin (ARANESP) injection - DIALYSIS  100 mcg Subcutaneous Q Thu-1800   doxazosin  2 mg Oral QHS   feeding supplement (NEPRO CARB STEADY)  237 mL Oral BID BM   folic acid  1 mg Oral Daily   heparin injection (subcutaneous)  5,000 Units Subcutaneous Q8H   insulin aspart  0-15 Units Subcutaneous TID WC   insulin aspart  0-5 Units Subcutaneous QHS   insulin aspart  3 Units  Subcutaneous TID WC   isosorbide-hydrALAZINE  1 tablet Oral TID   multivitamin  1 tablet Oral QHS   pantoprazole  40 mg Oral Daily   polyethylene glycol  17 g Oral Daily   senna-docusate  1 tablet Oral BID   sodium chloride flush  3 mL Intravenous Q12H   traZODone  50 mg Oral QHS    BMET    Component Value Date/Time   NA 136 11/05/2022 0552   NA 138 12/02/2019 0000   K 4.3 11/05/2022 0552   CL 98 11/05/2022 0552   CO2 26 11/05/2022 0552   GLUCOSE 122 (H) 11/05/2022 0552   GLUCOSE 112 (H) 06/19/2006 0948   BUN 70 (H) 11/05/2022 0552   BUN 29 (A) 12/02/2019 0000   CREATININE 5.19 (H) 11/05/2022 0552   CREATININE 3.59 (H) 03/03/2020 1454   CALCIUM 9.0 11/05/2022 0552   GFRNONAA 10 (L) 11/05/2022 0552   GFRNONAA 20 (L) 08/19/2019 1107   GFRAA 19 (L) 02/26/2020 0724   GFRAA 23 (L) 08/19/2019 1107   CBC    Component Value Date/Time   WBC 8.9 11/05/2022 0552   RBC 3.05 (L) 11/05/2022 0552   HGB 8.3 (L) 11/05/2022 0552   HGB 9.7 (L) 08/19/2019 1107   HCT 26.5 (L) 11/05/2022 0552   PLT 266  11/05/2022 0552   PLT 262 08/19/2019 1107   MCV 86.9 11/05/2022 0552   MCH 27.2 11/05/2022 0552   MCHC 31.3 11/05/2022 0552   RDW 16.4 (H) 11/05/2022 0552   LYMPHSABS 0.1 (L) 10/25/2022 0555   MONOABS 0.4 10/25/2022 0555   EOSABS 0.0 10/25/2022 0555   BASOSABS 0.0 10/25/2022 0555     Assessment/Plan:  New ESRD - first HD 10/25/22, for HD  today then on MWF schedule.  CLIP complete MWF [facility] and could leave today post HD for next Tx as outpt 3/18.   Acute encephalopathy - multifactorial with covid infection and uremia.  Slowly improving. Covid+ - per primary svc; off precautions Anemia of ESRD - IV iron and ESA, Hb increasing; last ESA 3/14 CKD-BMD - outpt mgmt  cont [facility] V[facility] access - RIJ TDC placed 10/27/22 by IR.  LUE AVG created 10/18/22 by Dr. Virl Cagey. Disposition -as above  Rexene Agent, MD  [employer]

## 2022-11-05 NOTE — TOC Transition Note (Signed)
Transition of Care [facility]) - CM/SW Discharge Note   Patient Details  Name: Austin [name] MRN: [id number] Date of Birth: 28-Oct-1934  Transition of Care [facility]) CM/SW Contact:  Amador Cunas, Jan [place] Phone Number: 11/05/2022, 2:47 PM   Clinical Narrative: Josem Kaufmann received for SNF after successful peer-to-peer by MD. Confirmed with Janie at [facility] they are prepared to admit pt to room 3205. Pt's spouse and granddtr Loma Sousa [phone] aware of dc and report agreeable. RN provided with number for report and PTAR arranged for transport. SW signing off at dc.   Wandra Feinstein, MSW, LCSW [phone] (coverage)        Final next level of care: Skilled Nursing Facility Barriers to Discharge: No Barriers Identified   Patient Goals and CMS Choice CMS Medicare.gov Compare Post Acute Care list provided to:: Patient Choice offered to / list presented to : Spouse, Patient  Discharge Placement                Patient chooses bed at: [facility] Patient to be transferred to facility by: [place] Name of family member notified: Courtney/granddtr Patient and family notified of of transfer: 11/05/22  Discharge Plan and Services Additional resources added to the After Visit Summary for     Discharge Planning Services: CM Consult                                 Social Determinants of Health (SDOH) Interventions SDOH Screenings   Food Insecurity: No Food Insecurity (07/22/2022)  Housing: Low Risk  (07/22/2022)  Transportation Needs: No Transportation Needs (07/27/2022)  Utilities: Not At Risk (07/22/2022)  Alcohol Screen: Low Risk  (02/10/2022)  Depression (PHQ2-9): Low Risk  (07/22/2022)  Financial Resource Strain: Low Risk  (02/10/2022)  Physical Activity: Insufficiently Active (02/10/2022)  Social Connections: Socially Integrated (02/10/2022)  Stress: No Stress Concern Present (02/10/2022)  Tobacco Use: Medium Risk (10/27/2022)     Readmission Risk  Interventions    10/28/2022    2:49 PM 09/30/2021   11:17 AM 07/13/2021   12:43 PM  Readmission Risk Prevention Plan  Transportation Screening Complete Complete Complete  PCP or Specialist Appt within 5-7 Days Complete    PCP or Specialist Appt within 3-5 Days  Complete Complete  Home Care Screening Complete    Medication Review (RN CM) Complete    HRI or Home Care Consult  Complete Complete  Social Work Consult for [place] Planning/Counseling  Complete Complete  Palliative Care Screening  Not Applicable Not Applicable  Medication Review [employer] photographer)  Complete Complete

## 2022-11-05 NOTE — Discharge Summary (Signed)
Physician Discharge Summary  Austin [name] [id number] DOB: 01-22-1935 DOA: 10/25/2022  PCP: Dorothyann Peng, NP  Admit date: 10/25/2022 Discharge date: 11/05/2022  Admitted From: home Disposition:  SNF  Recommendations for Outpatient Follow-up:  Follow up with PCP in 1-2 weeks Please obtain BMP/CBC in one week Continue dialysis as an outpatient, he will be at [facility] on MWF 12:40 chair time   Home Health: none Equipment/Devices: none  Discharge Condition: stable CODE STATUS: Full code Diet Orders (From admission, onward)     Start     Ordered   10/28/22 1608  Diet regular Room service appropriate? Yes; Fluid consistency: Thin; Fluid restriction: 1200 mL Fluid  Diet effective now       Comments: No salt packet  Question Answer Comment  Room service appropriate? Yes   Fluid consistency: Thin   Fluid restriction: 1200 mL Fluid      10/28/22 1609            HPI: Per admitting MD, Austin [name] is a 88 y.o. male with medical history significant of diabetes mellitus 2, hypertension, CAD, prior GI bleed, CHF, sleep apnea, CKD 4 with impending dialysis-status post left AV graft placement on 10/18/2022.  Patient presented today ER via EMS due to weakness and reported not acting like himself.  Noted to have a temperature of 102 F by EMS and was given Tylenol.  Concerns over possible community-acquired pneumonia so patient was given Rocephin and Zithromax..  O2 sats were low at 90% so he was placed on oxygen.  Chest x-ray concerning for acute bronchitis.  On exam he appeared volume overloaded with wife noting increased swelling in both legs greater on the left over the past week.  BNP was elevated on 3/4 at 2179 in the context of underlying CKD.  Lactic acid was normal at 1.5.  Patient was positive for COVID via PCR.  White count normal.  BUN 117 with creatinine is 6.8 which is baseline.  Potassium 3.4.  Repeat electrolyte panel on 3/5 revealed a potassium of 6.5 with a BUN greater than 130 and  a creatinine of 8.9.  Hospitalist consulted for admission and EDP has consulted nephrology in anticipation of likely initiation of hemodialysis.   Hospital Course / Discharge diagnoses: Principal Problem:   Acute respiratory failure with hypoxia (HCC) Active Problems:   Type 2 diabetes mellitus with hyperlipidemia (HCC)   Iron deficiency anemia due to chronic blood loss   Essential hypertension   GERD   Acute kidney injury superimposed on CKD ([place])   Pneumonia due to COVID-19 virus   CKD (chronic kidney disease) stage 5, GFR less than 15 ml/min (HCC)   Severe pulmonary hypertension (HCC)   Moderate aortic regurgitation   CAD in native artery   Protein-calorie malnutrition, severe   ESRD on dialysis [facility])   Principal problem Acute kidney injury complicating stage V CKD, possibly new ESRD with volume overload/metabolic acidosis - S/p LUE AVG placed 2/27.  Nephrology following.  Now on IHD.  He had a temporary HD cath which was removed and replaced by [facility] on 3/7, due to some bleeding complications.  He is now tolerating dialysis well, he has been established as an outpatient and has a dialysis spot.  Will be discharged in stable condition   Active problems COVID 19 acute bronchitis/acute respiratory failure with hypoxia - Hypoxia resolved.  Completed Decadron.  Tested positive on 3/5, now off precautions.  He was briefly on heparin due to elevated D-dimer, however with low  index of suspicion for PE to begin with, negative lower extremity venous Dopplers, bleeding from HD catheter site, and pharmacy recommendations, discontinued anticoagulation. Acute metabolic encephalopathy- Multifactorial due to ESRD with uremia, COVID-19.  This is now resolved Anemia due to ESRD -started on iron and ESA with HD.  No bleeding, hemoglobin at 8.3 on discharge.  Continue to monitor periodically as an outpatient  Type II DM with renal complications - 123456 7.4, acceptable in his age group Hypertension -continue  home regimen Dyslipidemia - Continue statins. Chronic combined CHF/severe pulmonary hypertension/moderate aortic regurgitation - Cardiac meds as below, appears euvolemic, volume management with HD, discontinue diuretics and potassium supplements  CAD - No angina.  Continue clopidogrel and statins. Reported OSA - Unclear if patient on CPAP prior to admission Prior history of GI bleeding - Asymptomatic.  Sepsis ruled out   Discharge Instructions   Allergies as of 11/05/2022       Reactions   Aspirin Other (See Comments)   High doses causes stomach ulcer and bleeding        Medication List     STOP taking these medications    eszopiclone 1 MG Tabs tablet Commonly known as: Lunesta   metolazone 5 MG tablet Commonly known as: ZAROXOLYN   oxyCODONE-acetaminophen 5-325 MG tablet Commonly known as: PERCOCET/ROXICET   potassium chloride 10 MEQ tablet Commonly known as: KLOR-CON   potassium chloride SA 20 MEQ tablet Commonly known as: KLOR-CON M   torsemide 20 MG tablet Commonly known as: DEMADEX       TAKE these medications    Accu-Chek Guide test strip Generic drug: glucose blood USE TO CHECK BLOOD GLUCOSE TWICE A DAY OR AS NEEDED   Accu-Chek Softclix Lancets lancets USED TO CHECK BLOOD GLUCOSE TWICE A DAY OR AS NEEDED   acetaminophen 500 MG tablet Commonly known as: TYLENOL Take 1,000 mg by mouth 2 (two) times daily.   albuterol 108 (90 Base) MCG/ACT inhaler Commonly known as: VENTOLIN HFA TAKE 2 PUFFS BY MOUTH EVERY 6 HOURS AS NEEDED FOR WHEEZE OR SHORTNESS OF BREATH   amLODipine 10 MG tablet Commonly known as: NORVASC TAKE 1 TABLET BY MOUTH EVERY DAY   atorvastatin 20 MG tablet Commonly known as: LIPITOR TAKE 1 TABLET BY MOUTH EVERY DAY   cholecalciferol 25 MCG (1000 UNIT) tablet Commonly known as: VITAMIN D3 Take 1,000 Units by mouth daily.   Citrucel oral powder Generic drug: methylcellulose 1 tablespoon every 2-3 days,alternating with  lactulose   cloNIDine 0.3 MG tablet Commonly known as: CATAPRES Take 1 tablet (0.3 mg total) by mouth 3 (three) times daily. What changed: when to take this   clopidogrel 75 MG tablet Commonly known as: PLAVIX TAKE 1 TABLET BY MOUTH EVERY DAY   doxazosin 2 MG tablet Commonly known as: CARDURA Take 2 mg by mouth at bedtime.   esomeprazole 40 MG capsule Commonly known as: NEXIUM TAKE 1 CAPSULE BY MOUTH EVERY DAY   guaiFENesin 600 MG 12 hr tablet Commonly known as: MUCINEX Take 600 mg by mouth at bedtime.   hydrocortisone 2.5 % rectal cream Commonly known as: ANUSOL-HC Place 1 application rectally 2 (two) times daily.   isosorbide-hydrALAZINE 20-37.5 MG tablet Commonly known as: BIDIL Take 1 tablet by mouth 3 (three) times daily.   lactulose 10 GM/15ML solution Commonly known as: Noonday BY MOUTH 2 TIMES DAILY AS NEEDED FOR MILD CONSTIPATION. What changed: See the new instructions.   Melatonin 10 MG Tabs Take 10 mg by mouth at  bedtime.   metoprolol succinate 100 MG 24 hr tablet Commonly known as: TOPROL-XL Take 1 tablet (100 mg total) by mouth daily. Take with or immediately following a meal.   neomycin-polymyxin b-dexamethasone 3.5-10000-0.1 Susp Commonly known as: MAXITROL Place 1 drop into both eyes 4 (four) times daily.   SYSTANE BALANCE OP Place 1 drop into both eyes at bedtime.   traZODone 50 MG tablet Commonly known as: DESYREL Take 1 tablet (50 mg total) by mouth at bedtime.   triamcinolone cream 0.1 % Commonly known as: KENALOG Apply 1 application topically daily as needed (dry/irritated skin).               Durable Medical Equipment  (From admission, onward)           Start     Ordered   11/04/22 1631  For home use only DME lightweight manual wheelchair with seat cushion  Once       Comments: Patient suffers from generalized weakness which impairs their ability to perform daily activities like grooming, bathing in the home.   A walker will not resolve  issue with performing activities of daily living. A wheelchair will allow patient to safely perform daily activities. Patient is not able to propel themselves in the home using a standard weight wheelchair due to general weakness. Patient can self propel in the lightweight wheelchair. Length of need 12 months . Accessories: elevating leg rests (ELRs), wheel locks, extensions and anti-tippers.   11/04/22 1631            Contact information for follow-up providers     [employer] thru [place] Follow up.   Why: You are covered for 12 transports through [employer] through [employer] policy at [employer] per trip.  Please call [phone] to set up transportation 48 hours before transportation is needed. Contact information: [place], [facility] Go on 11/07/2022.   Why: Schedule is Monday/Wednesday/Friday with 12:40 chair time.  For first appointment (3/18), please arrive at 11:40 to complete paperwork prior to treatment. Contact information: [address] [place] [address] [phone]              Contact information for after-discharge care     [facility]. Preferred SNF .   Service: Skilled Nursing Contact information: [place] [phone]                     Consultations: Nephrology  Procedures/Studies:  IR Fluoro Guide CV Line Right  Result Date: 10/27/2022 INDICATION: 88 year old male with history of acute on chronic kidney disease requiring hemodialysis status post temporary hemodialysis catheter placement on 10/25/2022, presenting with a hole in the external portion of the indwelling catheter. EXAM: TUNNELED CENTRAL VENOUS HEMODIALYSIS CATHETER PLACEMENT WITH FLUOROSCOPIC GUIDANCE MEDICATIONS: Ancef 2 gm IV . The antibiotic was given in an  appropriate time interval prior to skin puncture. ANESTHESIA/SEDATION: Moderate (conscious) sedation was employed during this procedure. A total of Versed 1.5 mg and Fentanyl 0 mcg was administered intravenously. Moderate Sedation Time: 0 minutes. The patient's level of consciousness and vital signs were monitored continuously by radiology nursing throughout the procedure under my direct supervision. FLUOROSCOPY TIME:  One mGy COMPLICATIONS: None immediate. PROCEDURE: Informed written consent was obtained from the patient after a discussion of the risks, benefits, and alternatives to treatment. Questions regarding the procedure were encouraged and  answered. The right neck and chest were prepped with chlorhexidine in a sterile fashion, and a sterile drape was applied covering the operative field. Maximum barrier sterile technique with sterile gowns and gloves were used for the procedure. A timeout was performed prior to the initiation of the procedure. Three indwelling right IJ catheter, a Rosen wire was advanced to the level of the IVC. A 14.5 French tunneled hemodialysis catheter measuring 23 cm from tip to cuff was tunneled in a retrograde fashion from the anterior chest wall to the established venotomy incision. The indwelling catheter was removed over the wire and a peel-away sheath was placed. The catheter was then placed through the peel-away sheath with the catheter tip ultimately positioned within the right atrium. Final catheter positioning was confirmed and documented with a spot radiographic image. The catheter aspirates and flushes normally. The catheter was flushed with appropriate volume heparin dwells. The catheter exit site was secured with a 0-Silk retention suture. The venotomy incision was closed with Dermabond. Sterile dressings were applied. The patient tolerated the procedure well without immediate post procedural complication. IMPRESSION: Successful placement of 23 cm tip to cuff tunneled  hemodialysis catheter via the right internal jugular vein with catheter tip terminating within the right atrium. The catheter is ready for immediate use. Ruthann Cancer, MD [facility] Radiology Electronically Signed   By: Ruthann Cancer M.D.   On: 10/27/2022 14:59   IR Fluoro Guide CV Line Right  Result Date: 10/26/2022 INDICATION: 88 year old male with history of acute on chronic kidney disease requiring hemodialysis. EXAM: NON-TUNNELED CENTRAL VENOUS HEMODIALYSIS CATHETER PLACEMENT WITH ULTRASOUND AND FLUOROSCOPIC GUIDANCE COMPARISON:  None Available. MEDICATIONS: None FLUOROSCOPY TIME:  Two mGy COMPLICATIONS: None immediate. PROCEDURE: Informed written consent was obtained from the patient after a discussion of the risks, benefits, and alternatives to treatment. Questions regarding the procedure were encouraged and answered. The right neck and chest were prepped with chlorhexidine in a sterile fashion, and a sterile drape was applied covering the operative field. Maximum barrier sterile technique with sterile gowns and gloves were used for the procedure. A timeout was performed prior to the initiation of the procedure. After the overlying soft tissues were anesthetized, a small venotomy incision was created and a micropuncture kit was utilized to access the internal jugular vein. Real-time ultrasound guidance was utilized for vascular access including the acquisition of a permanent ultrasound image documenting patency of the accessed vessel. The microwire was utilized to measure appropriate catheter length. A stiff glidewire was advanced to the level of the IVC. Under fluoroscopic guidance, the venotomy was serially dilated, ultimately allowing placement of a 20 cm temporary Trialysis catheter with tip ultimately terminating within the superior aspect of the right atrium. Final catheter positioning was confirmed and documented with a spot radiographic image. The  catheter aspirates and flushes normally. The catheter was flushed with appropriate volume heparin dwells. The catheter exit site was secured with a 0-Prolene retention suture. A dressing was placed. The patient tolerated the procedure well without immediate post procedural complication. IMPRESSION: Successful placement of a right internal jugular approach 20 cm temporary dialysis catheter with tip terminating with in the superior aspect of the right atrium. The catheter is ready for immediate use. PLAN: This catheter may be converted to a tunneled dialysis catheter at a later date as indicated. Ruthann Cancer, MD [facility] Radiology Electronically Signed   By: Ruthann Cancer M.D.   On: 10/26/2022 13:06   IR  US Guide Vasc Access Right  Result Date: 10/26/2022 INDICATION: 88 year old male with history of acute on chronic kidney disease requiring hemodialysis. EXAM: NON-TUNNELED CENTRAL VENOUS HEMODIALYSIS CATHETER PLACEMENT WITH ULTRASOUND AND FLUOROSCOPIC GUIDANCE COMPARISON:  None Available. MEDICATIONS: None FLUOROSCOPY TIME:  Two mGy COMPLICATIONS: None immediate. PROCEDURE: Informed written consent was obtained from the patient after a discussion of the risks, benefits, and alternatives to treatment. Questions regarding the procedure were encouraged and answered. The right neck and chest were prepped with chlorhexidine in a sterile fashion, and a sterile drape was applied covering the operative field. Maximum barrier sterile technique with sterile gowns and gloves were used for the procedure. A timeout was performed prior to the initiation of the procedure. After the overlying soft tissues were anesthetized, a small venotomy incision was created and a micropuncture kit was utilized to access the internal jugular vein. Real-time ultrasound guidance was utilized for vascular access including the acquisition of a permanent ultrasound image documenting patency of the  accessed vessel. The microwire was utilized to measure appropriate catheter length. A stiff glidewire was advanced to the level of the IVC. Under fluoroscopic guidance, the venotomy was serially dilated, ultimately allowing placement of a 20 cm temporary Trialysis catheter with tip ultimately terminating within the superior aspect of the right atrium. Final catheter positioning was confirmed and documented with a spot radiographic image. The catheter aspirates and flushes normally. The catheter was flushed with appropriate volume heparin dwells. The catheter exit site was secured with a 0-Prolene retention suture. A dressing was placed. The patient tolerated the procedure well without immediate post procedural complication. IMPRESSION: Successful placement of a right internal jugular approach 20 cm temporary dialysis catheter with tip terminating with in the superior aspect of the right atrium. The catheter is ready for immediate use. PLAN: This catheter may be converted to a tunneled dialysis catheter at a later date as indicated. Ruthann Cancer, MD [facility] Radiology Electronically Signed   By: Ruthann Cancer M.D.   On: 10/26/2022 13:06   VAS [place] LOWER EXTREMITY VENOUS (DVT)  Result Date: 10/26/2022  Lower Venous DVT Study Patient Name:  Austin [name]  Date of Exam:   10/26/2022 Medical Rec #: [id number]   Accession #:    [id number] Date of Birth: 1935-06-29   Patient Gender: M Patient Age:   32 years Exam Location:  [facility] Procedure:      VAS [place] LOWER EXTREMITY VENOUS (DVT) Referring Phys: Murray Hodgkins --------------------------------------------------------------------------------  Indications: Elevated D-dimer in setting of Covid (1.65).  Limitations: Poor ultrasound/tissue interface. Comparison Study: Previous exam on 09/18/20 was negative for DVT Performing Technologist: Rogelia Rohrer RVT, RDMS  Examination Guidelines: A complete evaluation includes  B-mode imaging, spectral Doppler, color Doppler, and power Doppler as needed of all accessible portions of each vessel. Bilateral testing is considered an integral part of a complete examination. Limited examinations for reoccurring indications may be performed as noted. The reflux portion of the exam is performed with the patient in reverse Trendelenburg.  +---------+---------------+---------+-----------+----------+--------------+ RIGHT    CompressibilityPhasicitySpontaneityPropertiesThrombus Aging +---------+---------------+---------+-----------+----------+--------------+ CFV      Full           Yes      Yes                                 +---------+---------------+---------+-----------+----------+--------------+ SFJ      Full                                                        +---------+---------------+---------+-----------+----------+--------------+  FV Prox  Full           Yes      Yes                                 +---------+---------------+---------+-----------+----------+--------------+ FV Mid   Full           Yes      Yes                                 +---------+---------------+---------+-----------+----------+--------------+ FV DistalFull           Yes      Yes                                 +---------+---------------+---------+-----------+----------+--------------+ PFV      Full                                                        +---------+---------------+---------+-----------+----------+--------------+ POP      Full           Yes      Yes                                 +---------+---------------+---------+-----------+----------+--------------+ PTV      Full                                                        +---------+---------------+---------+-----------+----------+--------------+ PERO     Full                                                         +---------+---------------+---------+-----------+----------+--------------+   +---------+---------------+---------+-----------+----------+--------------+ LEFT     CompressibilityPhasicitySpontaneityPropertiesThrombus Aging +---------+---------------+---------+-----------+----------+--------------+ CFV      Full           Yes      Yes                                 +---------+---------------+---------+-----------+----------+--------------+ SFJ      Full                                                        +---------+---------------+---------+-----------+----------+--------------+ FV Prox  Full           Yes      Yes                                 +---------+---------------+---------+-----------+----------+--------------+ FV Mid   Full  Yes      Yes                                 +---------+---------------+---------+-----------+----------+--------------+ FV DistalFull           Yes      Yes                                 +---------+---------------+---------+-----------+----------+--------------+ PFV      Full                                                        +---------+---------------+---------+-----------+----------+--------------+ POP      Full           Yes      Yes                                 +---------+---------------+---------+-----------+----------+--------------+ PTV      Full                                                        +---------+---------------+---------+-----------+----------+--------------+ PERO     Full                                                        +---------+---------------+---------+-----------+----------+--------------+     Summary: BILATERAL: - No evidence of deep vein thrombosis seen in the lower extremities, bilaterally. - RIGHT: - No cystic structure found in the popliteal fossa.  LEFT: - A cystic structure is found in the popliteal fossa (4.63 x 1.06 x 3.32 cm).  *See table(s) above  for measurements and observations. Electronically signed by Jamelle Haring on 10/26/2022 at 12:38:28 PM.    Final    US RENAL  Result Date: 10/25/2022 CLINICAL DATA:  Acute kidney injury EXAM: RENAL / URINARY TRACT ULTRASOUND COMPLETE COMPARISON:  None Available. FINDINGS: Right Kidney: Renal measurements: 10.0 x 7.0 x 4.6 cm = volume: 167.5 mL. Lower pole the right kidney not well visualized due to patient body habitus. Echogenicity within normal limits. Small simple appearing cyst of the mid region of the right kidney measuring to 4.2 cm and 1.5 cm. No mass or hydronephrosis visualized. Left Kidney: Renal measurements: 9.9 x 5.2 x 3.9 cm = volume: 103.5 mL. Lobulated contour. Echogenicity within normal limits. No mass or hydronephrosis visualized. Bladder: Appears normal for degree of bladder distention. Other: None. IMPRESSION: No hydronephrosis. Electronically Signed   By: Yetta Glassman M.D.   On: 10/25/2022 11:54   DG Chest Port 1 View  Result Date: 10/25/2022 CLINICAL DATA:  88 year old male with possible sepsis.  Fever. EXAM: PORTABLE CHEST 1 VIEW COMPARISON:  Chest x-ray 09/26/2021. FINDINGS: Lung volumes are normal. Widespread areas of interstitial prominence and peribronchial cuffing are noted, concerning for probable acute bronchitis. No definite confluent consolidative airspace disease. No  pleural effusions. No pneumothorax. Pulmonary vasculature does not appear engorged. Heart size is mildly enlarged. Upper mediastinal contours are within normal limits allowing for patient rotation to the left. Atherosclerotic calcifications in the thoracic aorta. IMPRESSION: 1. The appearance the chest is concerning for an acute bronchitis. 2. Mild cardiomegaly. 3. Aortic atherosclerosis. Electronically Signed   By: Vinnie Langton M.D.   On: 10/25/2022 06:18     Subjective: - no chest pain, shortness of breath, no abdominal pain, nausea or vomiting.   Discharge Exam: BP (!) 199/51 (BP Location: Right  Leg)   Pulse 91   Temp 97.9 F (36.6 C) (Oral)   Resp 16   Ht 5\' 7"  (1.702 m)   Wt 75 kg   SpO2 99%   BMI 25.90 kg/m   General: Pt is alert, awake, not in acute distress Cardiovascular: RRR, S1/S2 +, no rubs, no gallops Respiratory: CTA bilaterally, no wheezing, no rhonchi Abdominal: Soft, NT, ND, bowel sounds + Extremities: no edema, no cyanosis   The results of significant diagnostics from this hospitalization (including imaging, microbiology, ancillary and laboratory) are listed below for reference.     Microbiology: No results found for this or any previous visit (from the past 240 hour(s)).   Labs: Basic Metabolic Panel: Recent Labs  Lab 11/01/22 0216 11/03/22 0337 11/05/22 0552  NA 132* 132* 136  K 4.2 4.4 4.3  CL 92* 95* 98  CO2 25 27 26   GLUCOSE 129* 193* 122*  BUN 98* 90* 70*  CREATININE 5.97* 5.50* 5.19*  CALCIUM 8.7* 8.8* 9.0  MG  --  2.0  --   PHOS 5.1*  --  4.6   Liver Function Tests: Recent Labs  Lab 11/01/22 0216 11/03/22 0337 11/05/22 0552  AST  --  20  --   ALT  --  11  --   ALKPHOS  --  57  --   BILITOT  --  0.8  --   PROT  --  5.4*  --   ALBUMIN 2.5* 2.6* 2.8*   CBC: Recent Labs  Lab 11/01/22 0216 11/02/22 0236 11/03/22 0337 11/04/22 0646 11/05/22 0552  WBC 12.8* 14.6* 12.8* 10.8* 8.9  HGB 7.0* 8.1* 7.3* 7.8* 8.3*  HCT 20.9* 24.5* 22.3* 24.8* 26.5*  MCV 81.6 81.9 82.6 85.5 86.9  PLT 257 270 264 268 266   CBG: Recent Labs  Lab 11/02/22 2052 11/02/22 2355 11/03/22 0432 11/03/22 0827 11/03/22 1200  GLUCAP 187* 198* 173* 166* 212*   Hgb A1c No results for input(s): "HGBA1C" in the last 72 hours. Lipid Profile No results for input(s): "CHOL", "HDL", "LDLCALC", "TRIG", "CHOLHDL", "LDLDIRECT" in the last 72 hours. Thyroid function studies No results for input(s): "TSH", "T4TOTAL", "T3FREE", "THYROIDAB" in the last 72 hours.  Invalid input(s): "FREET3" Urinalysis    Component Value Date/Time   COLORURINE YELLOW  09/26/2021 2147   APPEARANCEUR CLOUDY (A) 09/26/2021 2147   LABSPEC 1.020 09/26/2021 2147   PHURINE 6.0 09/26/2021 2147   GLUCOSEU NEGATIVE 09/26/2021 2147   HGBUR SMALL (A) 09/26/2021 2147   HGBUR negative 09/19/2007 0000   BILIRUBINUR NEGATIVE 09/26/2021 2147   BILIRUBINUR n 11/18/2014 1003   KETONESUR NEGATIVE 09/26/2021 2147   PROTEINUR >300 (A) 09/26/2021 2147   UROBILINOGEN 0.2 11/18/2014 1003   UROBILINOGEN 1.0 01/26/2013 2059   NITRITE POSITIVE (A) 09/26/2021 2147   LEUKOCYTESUR NEGATIVE 09/26/2021 2147    FURTHER DISCHARGE INSTRUCTIONS:   Get Medicines reviewed and adjusted: Please take all your medications with you for your  next visit with your Primary MD   Laboratory/radiological data: Please request your Primary MD to go over all hospital tests and procedure/radiological results at the follow up, please ask your Primary MD to get all Hospital records sent to his/her office.   In some cases, they will be blood work, cultures and biopsy results pending at the time of your discharge. Please request that your primary care M.D. goes through all the records of your hospital data and follows up on these results.   Also Note the following: If you experience worsening of your admission symptoms, develop shortness of breath, life threatening emergency, suicidal or homicidal thoughts you must seek medical attention immediately by calling 911 or calling your MD immediately  if symptoms less severe.   You must read complete instructions/literature along with all the possible adverse reactions/side effects for all the Medicines you take and that have been prescribed to you. Take any new Medicines after you have completely understood and accpet all the possible adverse reactions/side effects.    Do not drive when taking Pain medications or sleeping medications (Benzodaizepines)   Do not take more than prescribed Pain, Sleep and Anxiety Medications. It is not advisable to combine  anxiety,sleep and pain medications without talking with your primary care practitioner   Special Instructions: If you have smoked or chewed Tobacco  in the last 2 yrs please stop smoking, stop any regular Alcohol  and or any Recreational drug use.   Wear Seat belts while driving.   Please note: You were cared for by a hospitalist during your hospital stay. Once you are discharged, your primary care physician will handle any further medical issues. Please note that NO REFILLS for any discharge medications will be authorized once you are discharged, as it is imperative that you return to your primary care physician (or establish a relationship with a primary care physician if you do not have one) for your post hospital discharge needs so that they can reassess your need for medications and monitor your lab values.  Time coordinating discharge: 45 minutes  SIGNED:  Marzetta Board, MD, PhD 11/05/2022, 1:39 PM

## 2022-11-05 NOTE — Progress Notes (Signed)
Received patient in bed to unit.  Alert and oriented.  Informed consent signed and in chart.   TX duration:3.75  Patient tolerated well.  Transported back to the room  Alert, without acute distress.  Hand-off given to patient's nurse.   Access used: Catheter Access issues: none  Total UF removed: 2 L  Medication(s) given: Ferric Gluconate 250 mg IV  Post HD VS: 190/51 P 90 R 18. O2 sat 99 in room air. Post HD weight: 75 kg   [facility]

## 2022-11-06 DIAGNOSIS — I5042 Chronic combined systolic (congestive) and diastolic (congestive) heart failure: Secondary | ICD-10-CM | POA: Diagnosis not present

## 2022-11-06 DIAGNOSIS — U071 COVID-19: Secondary | ICD-10-CM | POA: Diagnosis not present

## 2022-11-06 DIAGNOSIS — J189 Pneumonia, unspecified organism: Secondary | ICD-10-CM | POA: Diagnosis not present

## 2022-11-06 DIAGNOSIS — J9601 Acute respiratory failure with hypoxia: Secondary | ICD-10-CM | POA: Diagnosis not present

## 2022-11-07 DIAGNOSIS — N186 End stage renal disease: Secondary | ICD-10-CM | POA: Diagnosis not present

## 2022-11-07 DIAGNOSIS — K922 Gastrointestinal hemorrhage, unspecified: Secondary | ICD-10-CM | POA: Diagnosis not present

## 2022-11-07 DIAGNOSIS — K5900 Constipation, unspecified: Secondary | ICD-10-CM | POA: Diagnosis not present

## 2022-11-07 DIAGNOSIS — E119 Type 2 diabetes mellitus without complications: Secondary | ICD-10-CM | POA: Diagnosis not present

## 2022-11-07 DIAGNOSIS — I129 Hypertensive chronic kidney disease with stage 1 through stage 4 chronic kidney disease, or unspecified chronic kidney disease: Secondary | ICD-10-CM | POA: Diagnosis not present

## 2022-11-07 DIAGNOSIS — Z992 Dependence on renal dialysis: Secondary | ICD-10-CM | POA: Diagnosis not present

## 2022-11-07 DIAGNOSIS — D689 Coagulation defect, unspecified: Secondary | ICD-10-CM | POA: Insufficient documentation

## 2022-11-07 DIAGNOSIS — D509 Iron deficiency anemia, unspecified: Secondary | ICD-10-CM | POA: Diagnosis not present

## 2022-11-07 DIAGNOSIS — K219 Gastro-esophageal reflux disease without esophagitis: Secondary | ICD-10-CM | POA: Diagnosis not present

## 2022-11-07 DIAGNOSIS — I2510 Atherosclerotic heart disease of native coronary artery without angina pectoris: Secondary | ICD-10-CM | POA: Diagnosis not present

## 2022-11-07 DIAGNOSIS — I5040 Unspecified combined systolic (congestive) and diastolic (congestive) heart failure: Secondary | ICD-10-CM | POA: Diagnosis not present

## 2022-11-07 DIAGNOSIS — E785 Hyperlipidemia, unspecified: Secondary | ICD-10-CM | POA: Diagnosis not present

## 2022-11-07 LAB — GLUCOSE, CAPILLARY
Glucose-Capillary: 252 mg/dL — ABNORMAL HIGH (ref 70–99)
Glucose-Capillary: 132 mg/dL — ABNORMAL HIGH (ref 70–99)
Glucose-Capillary: 158 mg/dL — ABNORMAL HIGH (ref 70–99)
Glucose-Capillary: 166 mg/dL — ABNORMAL HIGH (ref 70–99)
Glucose-Capillary: 164 mg/dL — ABNORMAL HIGH (ref 70–99)
Glucose-Capillary: 83 mg/dL (ref 70–99)
Glucose-Capillary: 94 mg/dL (ref 70–99)

## 2022-11-07 NOTE — Progress Notes (Signed)
Late Note Entry  Pt d/c to snf on Saturday. Contacted [facility] to advise clinic that pt should start today as planned. Clinic requesting orders. Contacted Renal PA to request that orders be sent to clinic.   Melven Sartorius Renal Navigator [phone]

## 2022-11-08 ENCOUNTER — Encounter (HOSPITAL_COMMUNITY): Payer: Medicare PPO

## 2022-11-09 DIAGNOSIS — N186 End stage renal disease: Secondary | ICD-10-CM | POA: Diagnosis not present

## 2022-11-09 DIAGNOSIS — Z992 Dependence on renal dialysis: Secondary | ICD-10-CM | POA: Diagnosis not present

## 2022-11-10 DIAGNOSIS — E785 Hyperlipidemia, unspecified: Secondary | ICD-10-CM | POA: Diagnosis not present

## 2022-11-10 DIAGNOSIS — I129 Hypertensive chronic kidney disease with stage 1 through stage 4 chronic kidney disease, or unspecified chronic kidney disease: Secondary | ICD-10-CM | POA: Diagnosis not present

## 2022-11-10 DIAGNOSIS — N186 End stage renal disease: Secondary | ICD-10-CM | POA: Diagnosis not present

## 2022-11-10 DIAGNOSIS — D509 Iron deficiency anemia, unspecified: Secondary | ICD-10-CM | POA: Diagnosis not present

## 2022-11-10 DIAGNOSIS — I5040 Unspecified combined systolic (congestive) and diastolic (congestive) heart failure: Secondary | ICD-10-CM | POA: Diagnosis not present

## 2022-11-10 DIAGNOSIS — K5900 Constipation, unspecified: Secondary | ICD-10-CM | POA: Diagnosis not present

## 2022-11-10 DIAGNOSIS — E119 Type 2 diabetes mellitus without complications: Secondary | ICD-10-CM | POA: Diagnosis not present

## 2022-11-11 DIAGNOSIS — Z992 Dependence on renal dialysis: Secondary | ICD-10-CM | POA: Diagnosis not present

## 2022-11-11 DIAGNOSIS — N186 End stage renal disease: Secondary | ICD-10-CM | POA: Diagnosis not present

## 2022-11-14 DIAGNOSIS — Z992 Dependence on renal dialysis: Secondary | ICD-10-CM | POA: Diagnosis not present

## 2022-11-14 DIAGNOSIS — N186 End stage renal disease: Secondary | ICD-10-CM | POA: Diagnosis not present

## 2022-11-16 DIAGNOSIS — N186 End stage renal disease: Secondary | ICD-10-CM | POA: Diagnosis not present

## 2022-11-16 DIAGNOSIS — Z992 Dependence on renal dialysis: Secondary | ICD-10-CM | POA: Diagnosis not present

## 2022-11-18 DIAGNOSIS — Z992 Dependence on renal dialysis: Secondary | ICD-10-CM | POA: Diagnosis not present

## 2022-11-18 DIAGNOSIS — N186 End stage renal disease: Secondary | ICD-10-CM | POA: Diagnosis not present

## 2022-11-20 DIAGNOSIS — E1022 Type 1 diabetes mellitus with diabetic chronic kidney disease: Secondary | ICD-10-CM | POA: Diagnosis not present

## 2022-11-20 DIAGNOSIS — N186 End stage renal disease: Secondary | ICD-10-CM | POA: Diagnosis not present

## 2022-11-20 DIAGNOSIS — Z992 Dependence on renal dialysis: Secondary | ICD-10-CM | POA: Diagnosis not present

## 2022-11-21 DIAGNOSIS — Z992 Dependence on renal dialysis: Secondary | ICD-10-CM | POA: Diagnosis not present

## 2022-11-21 DIAGNOSIS — N186 End stage renal disease: Secondary | ICD-10-CM | POA: Diagnosis not present

## 2022-11-22 DIAGNOSIS — N186 End stage renal disease: Secondary | ICD-10-CM | POA: Diagnosis not present

## 2022-11-22 DIAGNOSIS — K219 Gastro-esophageal reflux disease without esophagitis: Secondary | ICD-10-CM | POA: Diagnosis not present

## 2022-11-22 DIAGNOSIS — I129 Hypertensive chronic kidney disease with stage 1 through stage 4 chronic kidney disease, or unspecified chronic kidney disease: Secondary | ICD-10-CM | POA: Diagnosis not present

## 2022-11-22 DIAGNOSIS — I5040 Unspecified combined systolic (congestive) and diastolic (congestive) heart failure: Secondary | ICD-10-CM | POA: Diagnosis not present

## 2022-11-23 DIAGNOSIS — N186 End stage renal disease: Secondary | ICD-10-CM | POA: Diagnosis not present

## 2022-11-23 DIAGNOSIS — Z992 Dependence on renal dialysis: Secondary | ICD-10-CM | POA: Diagnosis not present

## 2022-11-25 DIAGNOSIS — N186 End stage renal disease: Secondary | ICD-10-CM | POA: Diagnosis not present

## 2022-11-25 DIAGNOSIS — Z992 Dependence on renal dialysis: Secondary | ICD-10-CM | POA: Diagnosis not present

## 2022-11-28 DIAGNOSIS — I129 Hypertensive chronic kidney disease with stage 1 through stage 4 chronic kidney disease, or unspecified chronic kidney disease: Secondary | ICD-10-CM | POA: Diagnosis not present

## 2022-11-28 DIAGNOSIS — K219 Gastro-esophageal reflux disease without esophagitis: Secondary | ICD-10-CM | POA: Diagnosis not present

## 2022-11-28 DIAGNOSIS — Z992 Dependence on renal dialysis: Secondary | ICD-10-CM | POA: Diagnosis not present

## 2022-11-28 DIAGNOSIS — I5040 Unspecified combined systolic (congestive) and diastolic (congestive) heart failure: Secondary | ICD-10-CM | POA: Diagnosis not present

## 2022-11-28 DIAGNOSIS — N186 End stage renal disease: Secondary | ICD-10-CM | POA: Diagnosis not present

## 2022-11-30 ENCOUNTER — Other Ambulatory Visit: Payer: Medicare PPO

## 2022-11-30 DIAGNOSIS — Z992 Dependence on renal dialysis: Secondary | ICD-10-CM | POA: Diagnosis not present

## 2022-11-30 DIAGNOSIS — N186 End stage renal disease: Secondary | ICD-10-CM | POA: Diagnosis not present

## 2022-12-01 DIAGNOSIS — I5040 Unspecified combined systolic (congestive) and diastolic (congestive) heart failure: Secondary | ICD-10-CM | POA: Diagnosis not present

## 2022-12-01 DIAGNOSIS — E43 Unspecified severe protein-calorie malnutrition: Secondary | ICD-10-CM | POA: Diagnosis not present

## 2022-12-01 DIAGNOSIS — I129 Hypertensive chronic kidney disease with stage 1 through stage 4 chronic kidney disease, or unspecified chronic kidney disease: Secondary | ICD-10-CM | POA: Diagnosis not present

## 2022-12-01 DIAGNOSIS — K219 Gastro-esophageal reflux disease without esophagitis: Secondary | ICD-10-CM | POA: Diagnosis not present

## 2022-12-02 DIAGNOSIS — N186 End stage renal disease: Secondary | ICD-10-CM | POA: Diagnosis not present

## 2022-12-02 DIAGNOSIS — Z992 Dependence on renal dialysis: Secondary | ICD-10-CM | POA: Diagnosis not present

## 2022-12-05 DIAGNOSIS — Z992 Dependence on renal dialysis: Secondary | ICD-10-CM | POA: Diagnosis not present

## 2022-12-05 DIAGNOSIS — N186 End stage renal disease: Secondary | ICD-10-CM | POA: Diagnosis not present

## 2022-12-06 DIAGNOSIS — D509 Iron deficiency anemia, unspecified: Secondary | ICD-10-CM | POA: Diagnosis not present

## 2022-12-06 DIAGNOSIS — K922 Gastrointestinal hemorrhage, unspecified: Secondary | ICD-10-CM | POA: Diagnosis not present

## 2022-12-06 DIAGNOSIS — I5040 Unspecified combined systolic (congestive) and diastolic (congestive) heart failure: Secondary | ICD-10-CM | POA: Diagnosis not present

## 2022-12-06 DIAGNOSIS — I129 Hypertensive chronic kidney disease with stage 1 through stage 4 chronic kidney disease, or unspecified chronic kidney disease: Secondary | ICD-10-CM | POA: Diagnosis not present

## 2022-12-06 DIAGNOSIS — K219 Gastro-esophageal reflux disease without esophagitis: Secondary | ICD-10-CM | POA: Diagnosis not present

## 2022-12-07 DIAGNOSIS — N186 End stage renal disease: Secondary | ICD-10-CM | POA: Diagnosis not present

## 2022-12-07 DIAGNOSIS — Z992 Dependence on renal dialysis: Secondary | ICD-10-CM | POA: Diagnosis not present

## 2022-12-08 ENCOUNTER — Other Ambulatory Visit: Payer: Self-pay | Admitting: Adult Health

## 2022-12-08 DIAGNOSIS — I350 Nonrheumatic aortic (valve) stenosis: Secondary | ICD-10-CM | POA: Diagnosis not present

## 2022-12-08 DIAGNOSIS — I5032 Chronic diastolic (congestive) heart failure: Secondary | ICD-10-CM | POA: Diagnosis not present

## 2022-12-08 DIAGNOSIS — I351 Nonrheumatic aortic (valve) insufficiency: Secondary | ICD-10-CM | POA: Diagnosis not present

## 2022-12-08 DIAGNOSIS — I10 Essential (primary) hypertension: Secondary | ICD-10-CM | POA: Diagnosis not present

## 2022-12-09 ENCOUNTER — Telehealth: Payer: Self-pay | Admitting: Adult Health

## 2022-12-09 DIAGNOSIS — N186 End stage renal disease: Secondary | ICD-10-CM | POA: Diagnosis not present

## 2022-12-09 DIAGNOSIS — Z992 Dependence on renal dialysis: Secondary | ICD-10-CM | POA: Diagnosis not present

## 2022-12-09 NOTE — Telephone Encounter (Signed)
Patient need to schedule an ov for more refills. 

## 2022-12-09 NOTE — Telephone Encounter (Signed)
Pt made an appt. Today Rx refilled for 30 days.

## 2022-12-09 NOTE — Telephone Encounter (Signed)
[facility] called to FU on Standard Written Order regarding Pt's Oxygen -   Previously sent via [place]   Very bad connection, could not hear what he was saying or all of the digits of the call back number he was reciting.  Had to document number on caller ID :   [phone]

## 2022-12-09 NOTE — Telephone Encounter (Signed)
Tried to call number back that was provided but its asking for an ext number. I have no ext number to call back. However, the requested equipment needs to come from pulmonary not PCP.

## 2022-12-12 DIAGNOSIS — Z992 Dependence on renal dialysis: Secondary | ICD-10-CM | POA: Diagnosis not present

## 2022-12-12 DIAGNOSIS — N186 End stage renal disease: Secondary | ICD-10-CM | POA: Diagnosis not present

## 2022-12-13 DIAGNOSIS — U071 COVID-19: Secondary | ICD-10-CM | POA: Diagnosis not present

## 2022-12-13 DIAGNOSIS — J1282 Pneumonia due to coronavirus disease 2019: Secondary | ICD-10-CM | POA: Diagnosis not present

## 2022-12-13 DIAGNOSIS — I5043 Acute on chronic combined systolic (congestive) and diastolic (congestive) heart failure: Secondary | ICD-10-CM | POA: Diagnosis not present

## 2022-12-13 DIAGNOSIS — E1122 Type 2 diabetes mellitus with diabetic chronic kidney disease: Secondary | ICD-10-CM | POA: Diagnosis not present

## 2022-12-13 DIAGNOSIS — N186 End stage renal disease: Secondary | ICD-10-CM | POA: Diagnosis not present

## 2022-12-13 DIAGNOSIS — I2510 Atherosclerotic heart disease of native coronary artery without angina pectoris: Secondary | ICD-10-CM | POA: Diagnosis not present

## 2022-12-13 DIAGNOSIS — I110 Hypertensive heart disease with heart failure: Secondary | ICD-10-CM | POA: Diagnosis not present

## 2022-12-13 DIAGNOSIS — J9601 Acute respiratory failure with hypoxia: Secondary | ICD-10-CM | POA: Diagnosis not present

## 2022-12-13 DIAGNOSIS — D631 Anemia in chronic kidney disease: Secondary | ICD-10-CM | POA: Diagnosis not present

## 2022-12-14 DIAGNOSIS — Z992 Dependence on renal dialysis: Secondary | ICD-10-CM | POA: Diagnosis not present

## 2022-12-14 DIAGNOSIS — N186 End stage renal disease: Secondary | ICD-10-CM | POA: Diagnosis not present

## 2022-12-16 ENCOUNTER — Telehealth: Payer: Self-pay | Admitting: Adult Health

## 2022-12-16 DIAGNOSIS — N186 End stage renal disease: Secondary | ICD-10-CM | POA: Diagnosis not present

## 2022-12-16 DIAGNOSIS — Z992 Dependence on renal dialysis: Secondary | ICD-10-CM | POA: Diagnosis not present

## 2022-12-16 NOTE — Telephone Encounter (Signed)
Cara rn with [facility] is calling and needs VO SN 1x6 for CHF and Covid PNA

## 2022-12-16 NOTE — Telephone Encounter (Signed)
Okay for verbal orders? Please advise 

## 2022-12-16 NOTE — Telephone Encounter (Signed)
Verbal orders given to [employer]

## 2022-12-19 ENCOUNTER — Other Ambulatory Visit: Payer: Self-pay | Admitting: Adult Health

## 2022-12-19 DIAGNOSIS — N186 End stage renal disease: Secondary | ICD-10-CM | POA: Diagnosis not present

## 2022-12-19 DIAGNOSIS — Z992 Dependence on renal dialysis: Secondary | ICD-10-CM | POA: Diagnosis not present

## 2022-12-19 DIAGNOSIS — K5900 Constipation, unspecified: Secondary | ICD-10-CM

## 2022-12-20 DIAGNOSIS — E1022 Type 1 diabetes mellitus with diabetic chronic kidney disease: Secondary | ICD-10-CM | POA: Diagnosis not present

## 2022-12-20 DIAGNOSIS — J9601 Acute respiratory failure with hypoxia: Secondary | ICD-10-CM | POA: Diagnosis not present

## 2022-12-20 DIAGNOSIS — Z992 Dependence on renal dialysis: Secondary | ICD-10-CM | POA: Diagnosis not present

## 2022-12-20 DIAGNOSIS — N186 End stage renal disease: Secondary | ICD-10-CM | POA: Diagnosis not present

## 2022-12-20 DIAGNOSIS — I2510 Atherosclerotic heart disease of native coronary artery without angina pectoris: Secondary | ICD-10-CM | POA: Diagnosis not present

## 2022-12-20 DIAGNOSIS — I5043 Acute on chronic combined systolic (congestive) and diastolic (congestive) heart failure: Secondary | ICD-10-CM | POA: Diagnosis not present

## 2022-12-20 DIAGNOSIS — E1122 Type 2 diabetes mellitus with diabetic chronic kidney disease: Secondary | ICD-10-CM | POA: Diagnosis not present

## 2022-12-20 DIAGNOSIS — J1282 Pneumonia due to coronavirus disease 2019: Secondary | ICD-10-CM | POA: Diagnosis not present

## 2022-12-20 DIAGNOSIS — I110 Hypertensive heart disease with heart failure: Secondary | ICD-10-CM | POA: Diagnosis not present

## 2022-12-20 DIAGNOSIS — D631 Anemia in chronic kidney disease: Secondary | ICD-10-CM | POA: Diagnosis not present

## 2022-12-20 DIAGNOSIS — U071 COVID-19: Secondary | ICD-10-CM | POA: Diagnosis not present

## 2022-12-21 ENCOUNTER — Telehealth: Payer: Self-pay | Admitting: Adult Health

## 2022-12-21 DIAGNOSIS — Z992 Dependence on renal dialysis: Secondary | ICD-10-CM | POA: Diagnosis not present

## 2022-12-21 DIAGNOSIS — N186 End stage renal disease: Secondary | ICD-10-CM | POA: Diagnosis not present

## 2022-12-21 NOTE — Telephone Encounter (Signed)
Noted  

## 2022-12-21 NOTE — Telephone Encounter (Signed)
Ron Parker, OT with [facility]  Please fax Rx for regular rolling walker to [facility]  [phone]

## 2022-12-21 NOTE — Telephone Encounter (Signed)
Did you happen to get a phone number from [facility]?

## 2022-12-21 NOTE — Telephone Encounter (Signed)
Pt has an appt. 5/7. Will call the walker in at that appt. since we have not seen pt in a year. Demetrius Charity snot available for update but spoke with his supervisor who will relay the message.

## 2022-12-21 NOTE — Telephone Encounter (Signed)
I did not get his cell phone number, but...  [facility]  [address],  [place], [place] [address]   [phone]  [email or web address]

## 2022-12-23 ENCOUNTER — Telehealth: Payer: Self-pay | Admitting: Adult Health

## 2022-12-23 DIAGNOSIS — Z992 Dependence on renal dialysis: Secondary | ICD-10-CM | POA: Diagnosis not present

## 2022-12-23 DIAGNOSIS — N186 End stage renal disease: Secondary | ICD-10-CM | POA: Diagnosis not present

## 2022-12-23 NOTE — Telephone Encounter (Signed)
Left message for Erin to return phone call

## 2022-12-23 NOTE — Telephone Encounter (Signed)
Verbal orders given to Erin 

## 2022-12-23 NOTE — Telephone Encounter (Signed)
Okay for verbal orders? Please advise 

## 2022-12-23 NOTE — Telephone Encounter (Signed)
Erin  PT [facility] 1x4 then 1x eow x 4

## 2022-12-26 ENCOUNTER — Telehealth: Payer: Self-pay

## 2022-12-26 DIAGNOSIS — N186 End stage renal disease: Secondary | ICD-10-CM | POA: Diagnosis not present

## 2022-12-26 DIAGNOSIS — Z992 Dependence on renal dialysis: Secondary | ICD-10-CM | POA: Diagnosis not present

## 2022-12-26 NOTE — Telephone Encounter (Signed)
   Marvins Starratt DOB: 1935/06/12 MRN: [id number]   RIDER WAIVER AND RELEASE OF LIABILITY  For purposes of improving physical access to our facilities, [facility] is pleased to partner with third parties to provide [place] patients or other authorized individuals the option of convenient, on-demand ground transportation services (the [employer]") through use of the technology service that enables users to request on-demand ground transportation from independent third-party providers.  By opting to use and accept these [employer], I, the undersigned, hereby agree on behalf of myself, and on behalf of any minor child using the Science writer for whom I am the parent or legal guardian, as follows:  Science writer provided to me are provided by independent third-party transportation providers who are not [employer] or employees and who are unaffiliated with [employer]. [place] is neither a transportation carrier nor a common or public carrier. [facility] has no control over the quality or safety of the transportation that occurs as a result of the [employer]. [place] cannot guarantee that any third-party transportation provider will complete any arranged transportation service. [place] makes no representation, warranty, or guarantee regarding the reliability, timeliness, quality, safety, suitability, or availability of any of the Transport Services or that they will be error free. I fully understand that traveling by vehicle involves risks and dangers of serious bodily injury, including permanent disability, paralysis, and death. I agree, on behalf of myself and on behalf of any minor child using the Transport Services for whom I am the parent or legal guardian, that the entire risk arising out of my use of the [employer] remains solely with me, to the maximum extent permitted under applicable law. The [employer] are provided "as is"  and "as available." [place] disclaims all representations and warranties, express, implied or statutory, not expressly set out in these terms, including the implied warranties of merchantability and fitness for a particular purpose. I hereby waive and release [place], its agents, employees, officers, directors, representatives, insurers, attorneys, assigns, successors, subsidiaries, and affiliates from any and all past, present, or future claims, demands, liabilities, actions, causes of action, or suits of any kind directly or indirectly arising from acceptance and use of the [employer]. I further waive and release [place] and its affiliates from all present and future liability and responsibility for any injury or death to persons or damages to property caused by or related to the use of the [employer]. I have read this Waiver and Release of Liability, and I understand the terms used in it and their legal significance. This Waiver is freely and voluntarily given with the understanding that my right (as well as the right of any minor child for whom I am the parent or legal guardian using the [employer]) to legal recourse against [place] in connection with the [employer] is knowingly surrendered in return for use of these services.   I attest that I read the consent document to [place], gave Mr. Klase the opportunity to ask questions and answered the questions asked (if any). I affirm that Yaret Pont then provided consent for he's participation in this program.     Deri Fuelling

## 2022-12-26 NOTE — Telephone Encounter (Signed)
Telephone encounter was:  Successful.  12/26/2022 Name: Austin [name] MRN: [id number] DOB: 01/27/1935  Austin [name] is a 88 y.o. year old male who is a primary care patient of [name], NP . The community resource team was consulted for assistance with Transportation Needs   Care guide performed the following interventions: Patient provided with information about care guide support team and interviewed to confirm resource needs.Patient needs transportation for 5/7 appointment ride has been set up and accepted. Patient just has to confirm his part. Pick up time is 1:05 pm and will call for return ride.   Follow Up Plan:  Care guide will follow up with patient by phone over the next DAY    [facility] Guide, [facility] [phone] [address] [place], [place], [place] [address] Phone: [phone] Email: [name] DOB: 10/15/1934 MRN: [id number]   RIDER WAIVER AND RELEASE OF LIABILITY  For purposes of improving physical access to our facilities, [name] is pleased to partner with third parties to provide [facility] patients or other authorized individuals the option of convenient, on-demand ground transportation services (the [employer]") through use of the technology service that enables users to request on-demand ground transportation from independent third-party providers.  By opting to use and accept these [employer], I, the undersigned, hereby agree on behalf of myself, and on behalf of any minor child using the Science writer for whom I am the parent or legal guardian, as follows:  Science writer provided to me are provided by independent third-party transportation providers who are not [employer] or employees and who are unaffiliated with [employer]. [place] is neither a transportation carrier nor a common or public carrier. [place] has no control over the quality or safety of the  transportation that occurs as a result of the [employer]. [place] cannot guarantee that any third-party transportation provider will complete any arranged transportation service. [facility] makes no representation, warranty, or guarantee regarding the reliability, timeliness, quality, safety, suitability, or availability of any of the Transport Services or that they will be error free. I fully understand that traveling by vehicle involves risks and dangers of serious bodily injury, including permanent disability, paralysis, and death. I agree, on behalf of myself and on behalf of any minor child using the Transport Services for whom I am the parent or legal guardian, that the entire risk arising out of my use of the [employer] remains solely with me, to the maximum extent permitted under applicable law. The [employer] are provided "as is" and "as available." [place] disclaims all representations and warranties, express, implied or statutory, not expressly set out in these terms, including the implied warranties of merchantability and fitness for a particular purpose. I hereby waive and release , its agents, employees, officers, directors, representatives, insurers, attorneys, assigns, successors, subsidiaries, and affiliates from any and all past, present, or future claims, demands, liabilities, actions, causes of action, or suits of any kind directly or indirectly arising from acceptance and use of the [employer]. I further waive and release  and its affiliates from all present and future liability and responsibility for any injury or death to persons or damages to property caused by or related to the use of the [employer]. I have read this Waiver and Release of Liability, and I understand the terms used in it and their legal significance. This Waiver is freely and voluntarily given  with the understanding that my right (as well as the  right of any minor child for whom I am the parent or legal guardian using the [employer]) to legal recourse against [place] in connection with the [employer] is knowingly surrendered in return for use of these services.   I attest that I read the consent document to Austin [name], gave Mr. Geiser the opportunity to ask questions and answered the questions asked (if any). I affirm that Brijesh Vite then provided consent for he's participation in this program.     Deri Fuelling

## 2022-12-26 NOTE — Telephone Encounter (Signed)
   Telephone encounter was:  Successful.  12/26/2022 Name: Austin [name] MRN: [id number] DOB: November 13, 1934  Austin [name] is a 88 y.o. year old male who is a primary care patient of [name], NP . The community resource team was consulted for assistance with Transportation Needs   Care guide performed the following interventions: Patient provided with information about care guide support team and interviewed to confirm resource needs. Ride confirmation  Follow Up Plan:  No further follow up planned at this time. The patient has been provided with needed resources.    Lenard Forth [facility] Guide, [facility] [phone] [address] [place], [place], [place] [address] Phone: [phone] Email: [email or web address]

## 2022-12-27 ENCOUNTER — Ambulatory Visit: Payer: Medicare PPO | Admitting: Adult Health

## 2022-12-27 ENCOUNTER — Encounter: Payer: Self-pay | Admitting: Adult Health

## 2022-12-27 VITALS — BP 148/40 | HR 56 | Temp 98.1°F | Ht 67.0 in | Wt 164.0 lb

## 2022-12-27 DIAGNOSIS — I2583 Coronary atherosclerosis due to lipid rich plaque: Secondary | ICD-10-CM

## 2022-12-27 DIAGNOSIS — D631 Anemia in chronic kidney disease: Secondary | ICD-10-CM

## 2022-12-27 DIAGNOSIS — N179 Acute kidney failure, unspecified: Secondary | ICD-10-CM

## 2022-12-27 DIAGNOSIS — I5043 Acute on chronic combined systolic (congestive) and diastolic (congestive) heart failure: Secondary | ICD-10-CM | POA: Diagnosis not present

## 2022-12-27 DIAGNOSIS — J9601 Acute respiratory failure with hypoxia: Secondary | ICD-10-CM

## 2022-12-27 DIAGNOSIS — R2681 Unsteadiness on feet: Secondary | ICD-10-CM | POA: Diagnosis not present

## 2022-12-27 DIAGNOSIS — J1282 Pneumonia due to coronavirus disease 2019: Secondary | ICD-10-CM | POA: Diagnosis not present

## 2022-12-27 DIAGNOSIS — G9341 Metabolic encephalopathy: Secondary | ICD-10-CM

## 2022-12-27 DIAGNOSIS — Z992 Dependence on renal dialysis: Secondary | ICD-10-CM

## 2022-12-27 DIAGNOSIS — I110 Hypertensive heart disease with heart failure: Secondary | ICD-10-CM | POA: Diagnosis not present

## 2022-12-27 DIAGNOSIS — I2510 Atherosclerotic heart disease of native coronary artery without angina pectoris: Secondary | ICD-10-CM

## 2022-12-27 DIAGNOSIS — I5032 Chronic diastolic (congestive) heart failure: Secondary | ICD-10-CM

## 2022-12-27 DIAGNOSIS — N185 Chronic kidney disease, stage 5: Secondary | ICD-10-CM

## 2022-12-27 DIAGNOSIS — I10 Essential (primary) hypertension: Secondary | ICD-10-CM

## 2022-12-27 DIAGNOSIS — Z7984 Long term (current) use of oral hypoglycemic drugs: Secondary | ICD-10-CM

## 2022-12-27 DIAGNOSIS — E785 Hyperlipidemia, unspecified: Secondary | ICD-10-CM

## 2022-12-27 DIAGNOSIS — N189 Chronic kidney disease, unspecified: Secondary | ICD-10-CM

## 2022-12-27 DIAGNOSIS — E1122 Type 2 diabetes mellitus with diabetic chronic kidney disease: Secondary | ICD-10-CM

## 2022-12-27 DIAGNOSIS — I5033 Acute on chronic diastolic (congestive) heart failure: Secondary | ICD-10-CM

## 2022-12-27 DIAGNOSIS — N186 End stage renal disease: Secondary | ICD-10-CM | POA: Diagnosis not present

## 2022-12-27 DIAGNOSIS — U071 COVID-19: Secondary | ICD-10-CM | POA: Diagnosis not present

## 2022-12-27 MED ORDER — AMLODIPINE BESYLATE 10 MG PO TABS
10.0000 mg | ORAL_TABLET | Freq: Every day | ORAL | 3 refills | Status: DC
Start: 2022-12-27 — End: 2023-12-05

## 2022-12-27 MED ORDER — LACTULOSE 20 GM/30ML PO SOLN: 30.0000 mL | Freq: Two times a day (BID) | ORAL | 1 refills | Status: DC | PRN

## 2022-12-27 MED ORDER — ATORVASTATIN CALCIUM 20 MG PO TABS
20.0000 mg | ORAL_TABLET | Freq: Every day | ORAL | 3 refills | Status: DC
Start: 2022-12-27 — End: 2023-04-11

## 2022-12-27 NOTE — Patient Instructions (Signed)
It was great seeing you today and I am glad you are feeling better.   We will check some labs on you today   Medication have been sent to the pharmacy for you

## 2022-12-27 NOTE — Progress Notes (Addendum)
Subjective:    Patient ID: Austin [name], male    DOB: [birth year], 88 y.o.   MRN: [id number]  HPI 88 year old male who  has a past medical history of Acute diastolic CHF (congestive heart failure) (HCC) (09/17/2020), Acute exacerbation of CHF (congestive heart failure) (HCC) (08/04/2019), ANEMIA DUE TO CHRONIC BLOOD LOSS (03/13/2007), CAROTID ARTERY STENOSIS (05/10/2010), CHF (congestive heart failure) (HCC), DIABETES MELLITUS, TYPE II (09/19/2007), DISEASE, CEREBROVASCULAR NEC (03/05/2007), GERD (03/13/2007), HYPERLIPIDEMIA (03/05/2007), HYPERTENSION (03/05/2007), HYPOKALEMIA (11/09/2009), KNEE PAIN, RIGHT (11/09/2009), PROSTATE CANCER, HX OF (03/05/2007), and RENAL DISEASE, CHRONIC (02/03/2009).  He presents to the office today for TCM visit   Admit Date 3/5/20243/16/2024 Discharge from hospital  Discharge from SNF " two weeks ago"  He presented to the emergency room via EMS for weakness and not acting like himself.  He was noted to have a temperature of 102 degrees by EMS and was given Tylenol.  Originally there were concerns for community-acquired pneumonia so he was given Rocephin and Zithromax.  O2 sats were low at 90% so he was placed on oxygen.  His chest x-ray was concerning for acute bronchitis.  On exam he appeared volume overloaded with increased swelling in his legs greater on the left over the past week.  His BNP was elevated on 10/24/2022 at 2179 in the context of underlying CKD.  His lactic acid was normal at 1.5.  Patient was positive for COVID via PCR.  He had a normal white count, BUN 117 with creatinine 6.8 which was his baseline.  Potassium was 3.4.  Repeat electrolyte panel on 10/25/2022 revealed a potassium of 6.5 with a BUN greater than 130 and a creatinine of 8.9.  Nephrology was consulted for likely initiation of hemodialysis  Hospital Course   Acute kidney injury complicating stage V CKD -Status post LUE AVG placed in February 2024.  Now on IHD.  He had a temporary  hemodialysis cath that her which was removed and replaced by [facility] on 10/27/2022 due to some bleeding complications.  He is tolerating dialysis well and has established outpatient.  Covid 19 acute bronchitis/acute respiratory failure with hypoxia -His hypoxia resolved.  Completed Decadron.  He was briefly on heparin due to elevated D-dimer however low suspicion of PE to begin with, negative lower extremity venous Dopplers.  He did have bleeding from hemodialysis catheter site at which time anticoagulation was discontinued  Metabolic encephalopathy -type factorial due to ESRD with uremia and COVID-19.  This resolved upon discharge  Anemia due to ESRD -He was started on iron and ESA with hemodialysis.  Hemoglobin 8.3 on discharge.  Type 2 diabetes mellitus with renal complications -A1c 7.4.  Acceptable  for his age group  Type II DM with renal complications  - Continue home regimen of Norvasc 10 mg, Toprol 100 mg daily, clonidine 0.3 mg BID  Dyslipidemia  - Continue home statins   Chronic Combined CHF/severe pulmonary hypertension/moderate aortic regurgitation  -Euvolemic discharge.  Volume management with hemodialysis, discontinue diuretics and potassium supplements  CAD -Continued on Plavix and statin  Today, he presents to the office with his wife.  He reports that he is feeling well overall, going to dialysis on Monday Wednesday Friday.  He does not have any shortness of breath, chest pain, fevers, or chills.  Patient reports that he has improved significantly since being home from a skilled nursing facility, both feel as though he is back to his baseline except for his gait which he is working with physical therapy for.  His physical therapist would like him to have a rolling walker to help get him up walking more often.  He has no acute complaints, does need a refill of his blood pressure medication, statin, and lactulose that he takes as needed for constipation   Review of Systems   Constitutional: Negative.   HENT: Negative.    Eyes: Negative.   Respiratory: Negative.    Cardiovascular: Negative.   Gastrointestinal: Negative.   Endocrine: Negative.   Genitourinary: Negative.   Musculoskeletal:  Positive for gait problem.  Skin: Negative.   Allergic/Immunologic: Negative.   Neurological:  Positive for weakness.  Hematological: Negative.   Psychiatric/Behavioral: Negative.    All other systems reviewed and are negative.  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 BLOOD LOSS 03/13/2007   CAROTID ARTERY STENOSIS 05/10/2010   CHF (congestive heart failure) (HCC)    DIABETES MELLITUS, TYPE II 09/19/2007   DISEASE, CEREBROVASCULAR NEC 03/05/2007   GERD 03/13/2007   HYPERLIPIDEMIA 03/05/2007   HYPERTENSION 03/05/2007   HYPOKALEMIA 11/09/2009   KNEE PAIN, RIGHT 11/09/2009   PROSTATE CANCER, HX OF 03/05/2007   RENAL DISEASE, CHRONIC 02/03/2009    Social History   Socioeconomic History   Marital status: Married    Spouse name: Not on file   Number of children: Not on file   Years of education: Not on file   Highest education level: Not on file  Occupational History   Not on file  Tobacco Use   Smoking status: Former    Packs/day: 1.00    Years: 10.00    Additional pack years: 0.00    Total pack years: 10.00    Types: Cigarettes    Quit date: 08/22/1974    Years since quitting: 48.3   Smokeless tobacco: Never  Vaping Use   Vaping Use: Never used  Substance and Sexual Activity   Alcohol use: No    Alcohol/week: 0.0 standard drinks of alcohol   Drug use: No   Sexual activity: Not Currently  Other Topics Concern   Not on file  Social History Narrative   Retired - Administrator, arts    Married 53 years       He enjoys traveling    Social Determinants of Corporate investment banker Strain:  Low Risk  (02/10/2022)   Overall Financial Resource Strain (CARDIA)    Difficulty of Paying Living Expenses: Not hard at all  Food Insecurity: No Food Insecurity (07/22/2022)   Hunger Vital Sign    Worried About Running Out of Food in the Last Year: Never true    Ran Out of Food in the Last Year: Never true  Transportation Needs: No Transportation Needs (07/27/2022)   PRAPARE - Administrator, Civil Service (Medical): No    Lack of Transportation (Non-Medical): No  Physical Activity: Insufficiently Active (02/10/2022)   Exercise Vital Sign    Days of Exercise per Week: 2 days    Minutes of Exercise per Session: 30 min  Stress: No Stress Concern Present (02/10/2022)   [employer] of Occupational Health - Occupational Stress Questionnaire    Feeling of Stress : Not at all  Social Connections: Socially Integrated (02/10/2022)   Social Connection and Isolation Panel [NHANES]    Frequency of Communication with Friends and Family: More than three times a week    Frequency  of Social Gatherings with Friends and Family: More than three times a week    Attends Religious Services: More than 4 times per year    Active Member of Clubs or Organizations: Yes    Attends Banker Meetings: More than 4 times per year    Marital Status: Married  Catering manager Violence: Not At Risk (02/10/2022)   Humiliation, Afraid, Rape, and Kick questionnaire    Fear of Current or Ex-Partner: No    Emotionally Abused: No    Physically Abused: No    Sexually Abused: No    Past Surgical History:  Procedure Laterality Date   AV FISTULA PLACEMENT Left 10/18/2022   Procedure: INSERTION OF LEFT ARM BRACHIAL ARTERY TO AXILLARY VEIN ARTERIOVENOUS (AV) GORE-TEX GRAFT;  Surgeon: Victorino Sparrow, MD;  Location: [facility] OR;  Service: Vascular;  Laterality: Left;   CAROTID ARTERY ANGIOPLASTY Right Oct. 10, 2001   ESOPHAGOGASTRODUODENOSCOPY (EGD) WITH PROPOFOL N/A 11/04/2016   Procedure:  ESOPHAGOGASTRODUODENOSCOPY (EGD) WITH PROPOFOL;  Surgeon: Kathi Der, MD;  Location: [facility] ENDOSCOPY;  Service: Gastroenterology;  Laterality: N/A;   IR FLUORO GUIDE CV LINE RIGHT  10/25/2022   IR FLUORO GUIDE CV LINE RIGHT  10/27/2022   IR US GUIDE VASC ACCESS RIGHT  10/25/2022   IR US GUIDE VASC ACCESS RIGHT  10/27/2022   LAPAROSCOPIC APPENDECTOMY  02/20/2012   Procedure: APPENDECTOMY LAPAROSCOPIC;  Surgeon: Almond Lint, MD;  Location: [facility] OR;  Service: General;  Laterality: N/A;   PROSTATE SURGERY     prostatectomy    Family History  Problem Relation Age of Onset   Hypertension Mother    Cancer Father        Mesothelioma    Stomach cancer Brother    Cancer Brother    Cancer - Cervical Brother    Cancer Brother    Diabetes Brother    Esophageal cancer Neg Hx    Colon cancer Neg Hx    Pancreatic cancer Neg Hx     Allergies  Allergen Reactions   Aspirin Other (See Comments)    High doses causes stomach ulcer and bleeding    Current Outpatient Medications on File Prior to Visit  Medication Sig Dispense Refill   ACCU-CHEK GUIDE test strip USE TO CHECK BLOOD GLUCOSE TWICE A DAY OR AS NEEDED 100 strip 1   Accu-Chek Softclix Lancets lancets USED TO CHECK BLOOD GLUCOSE TWICE A DAY OR AS NEEDED 100 each 6   acetaminophen (TYLENOL) 500 MG tablet Take 1,000 mg by mouth 2 (two) times daily.     albuterol (VENTOLIN HFA) 108 (90 Base) MCG/ACT inhaler TAKE 2 PUFFS BY MOUTH EVERY 6 HOURS AS NEEDED FOR WHEEZE OR SHORTNESS OF BREATH 8.5 each 5   cholecalciferol (VITAMIN D3) 25 MCG (1000 UNIT) tablet Take 1,000 Units by mouth daily.     cloNIDine (CATAPRES) 0.3 MG tablet Take 1 tablet (0.3 mg total) by mouth 3 (three) times daily. (Patient taking differently: Take 0.3 mg by mouth 2 (two) times daily.) 60 tablet 0   clopidogrel (PLAVIX) 75 MG tablet TAKE 1 TABLET BY MOUTH EVERY DAY (Patient taking differently: Take 75 mg by mouth daily.) 90 tablet 3   doxazosin (CARDURA) 2 MG tablet Take 2 mg by  mouth at bedtime.   3   esomeprazole (NEXIUM) 40 MG capsule TAKE 1 CAPSULE BY MOUTH EVERY DAY 90 capsule 3   guaiFENesin (MUCINEX) 600 MG 12 hr tablet Take 600 mg by mouth at bedtime.     hydrocortisone (  ANUSOL-HC) 2.5 % rectal cream Place 1 application rectally 2 (two) times daily. 28 g 3   isosorbide-hydrALAZINE (BIDIL) 20-37.5 MG tablet Take 1 tablet by mouth 3 (three) times daily.     KLOR-CON M10 10 MEQ tablet Take by mouth.     Melatonin 10 MG TABS Take 10 mg by mouth at bedtime.     methylcellulose (CITRUCEL) oral powder 1 tablespoon every 2-3 days,alternating with lactulose     metolazone (ZAROXOLYN) 5 MG tablet Take 5 mg by mouth daily as needed.     neomycin-polymyxin b-dexamethasone (MAXITROL) 3.5-10000-0.1 SUSP Place 1 drop into both eyes 4 (four) times daily.     Propylene Glycol (SYSTANE BALANCE OP) Place 1 drop into both eyes at bedtime.     sevelamer carbonate (RENVELA) 800 MG tablet Take 800 mg by mouth 3 (three) times daily.     torsemide (DEMADEX) 20 MG tablet Take 40 mg by mouth 2 (two) times daily.     traZODone (DESYREL) 50 MG tablet Take 1 tablet (50 mg total) by mouth at bedtime.     triamcinolone cream (KENALOG) 0.1 % Apply 1 application topically daily as needed (dry/irritated skin).     metoprolol succinate (TOPROL-XL) 100 MG 24 hr tablet Take 1 tablet (100 mg total) by mouth daily. Take with or immediately following a meal. (Patient not taking: Reported on 10/14/2022) 30 tablet 0   Current Facility-Administered Medications on File Prior to Visit  Medication Dose Route Frequency Provider Last Rate Last Admin   ammonium lactate (LAC-HYDRIN) 12 % lotion   Topical PRN Candelaria Stagers, DPM        BP (!) 148/40   Pulse (!) 56   Temp 98.1 F (36.7 C) (Oral)   Ht 5\' 7"  (1.702 m)   Wt 164 lb (74.4 kg)   SpO2 97%   BMI 25.69 kg/m       Objective:   Physical Exam Vitals and nursing note reviewed.  Constitutional:      Appearance: Normal appearance.   Cardiovascular:     Rate and Rhythm: Normal rate and regular rhythm.     Pulses: Normal pulses.     Heart sounds: Normal heart sounds.  Pulmonary:     Effort: Pulmonary effort is normal.     Breath sounds: Normal breath sounds.  Abdominal:     General: Abdomen is flat. Bowel sounds are normal.     Palpations: Abdomen is soft.  Musculoskeletal:        General: Normal range of motion.     Right lower leg: No edema.     Left lower leg: No edema.  Skin:    General: Skin is warm and dry.     Comments: HD access LUE  Neurological:     General: No focal deficit present.     Mental Status: He is alert and oriented to person, place, and time.     Gait: Gait abnormal.  Psychiatric:        Mood and Affect: Mood normal.        Behavior: Behavior normal.        Thought Content: Thought content normal.        Judgment: Judgment normal.       Assessment & Plan:  1. Acute kidney injury superimposed on CKD [facility]) -Reviewed hospital notes, discharge instructions, labs, and imaging with the patient and his wife.  All questions answered to the best of my ability. - Comprehensive metabolic panel; Future - CBC with  Differential/Platelet; Future - atorvastatin (LIPITOR) 20 MG tablet; Take 1 tablet (20 mg total) by mouth daily.  Dispense: 90 tablet; Refill: 3 - CBC with Differential/Platelet - Comprehensive metabolic panel  2. Acute respiratory failure with hypoxia (HCC) - Resolved  - Comprehensive metabolic panel; Future - CBC with Differential/Platelet; Future - CBC with Differential/Platelet - Comprehensive metabolic panel  3. Stage 5 chronic kidney disease, on dialysis  - Per nephrology  - Comprehensive metabolic panel; Future - CBC with Differential/Platelet; Future - atorvastatin (LIPITOR) 20 MG tablet; Take 1 tablet (20 mg total) by mouth daily.  Dispense: 90 tablet; Refill: 3 - CBC with Differential/Platelet - Comprehensive metabolic panel  4. Metabolic encephalopathy -  Resolved  - Comprehensive metabolic panel; Future - CBC with Differential/Platelet; Future - CBC with Differential/Platelet - Comprehensive metabolic panel  5. Anemia due to chronic kidney disease, on chronic dialysis (HCC) - continue iron suppelement - Comprehensive metabolic panel; Future - CBC with Differential/Platelet; Future - CBC with Differential/Platelet - Comprehensive metabolic panel  6. Type 2 diabetes mellitus with chronic kidney disease on chronic dialysis, without long-term current use of insulin (HCC) - Continue to monitor  - Comprehensive metabolic panel; Future - CBC with Differential/Platelet; Future - CBC with Differential/Platelet - Comprehensive metabolic panel  7. Dyslipidemia - Continue statin  - Comprehensive metabolic panel; Future - CBC with Differential/Platelet; Future - CBC with Differential/Platelet - Comprehensive metabolic panel  8. Acute on chronic diastolic CHF (congestive heart failure) (HCC) - No signs of fluid overload in the office today  - Comprehensive metabolic panel; Future - CBC with Differential/Platelet; Future - atorvastatin (LIPITOR) 20 MG tablet; Take 1 tablet (20 mg total) by mouth daily.  Dispense: 90 tablet; Refill: 3 - CBC with Differential/Platelet - Comprehensive metabolic panel  9. Coronary artery disease due to lipid rich plaque - Continue Lipitor  - Comprehensive metabolic panel; Future - CBC with Differential/Platelet; Future - atorvastatin (LIPITOR) 20 MG tablet; Take 1 tablet (20 mg total) by mouth daily.  Dispense: 90 tablet; Refill: 3 - CBC with Differential/Platelet - Comprehensive metabolic panel  10. Gait instability - Prescription faxed for rolling walker with seat - Comprehensive metabolic panel; Future - CBC with Differential/Platelet; Future - CBC with Differential/Platelet - Comprehensive metabolic panel  11. Essential hypertension - Well controlled. No change in therapy  - Comprehensive  metabolic panel; Future - CBC with Differential/Platelet; Future - amLODipine (NORVASC) 10 MG tablet; Take 1 tablet (10 mg total) by mouth daily.  Dispense: 90 tablet; Refill: 3 - CBC with Differential/Platelet - Comprehensive metabolic panel  [name], NP

## 2022-12-28 ENCOUNTER — Telehealth: Payer: Self-pay

## 2022-12-28 ENCOUNTER — Other Ambulatory Visit: Payer: Self-pay

## 2022-12-28 DIAGNOSIS — Z992 Dependence on renal dialysis: Secondary | ICD-10-CM | POA: Diagnosis not present

## 2022-12-28 DIAGNOSIS — N186 End stage renal disease: Secondary | ICD-10-CM | POA: Diagnosis not present

## 2022-12-28 LAB — CBC WITH DIFFERENTIAL/PLATELET
Basophils Absolute: 0.1 10*3/uL (ref 0.0–0.1)
Basophils Relative: 0.9 % (ref 0.0–3.0)
Eosinophils Absolute: 0.2 10*3/uL (ref 0.0–0.7)
Eosinophils Relative: 2.2 % (ref 0.0–5.0)
HCT: 32.3 % — ABNORMAL LOW (ref 39.0–52.0)
Hemoglobin: 10.4 g/dL — ABNORMAL LOW (ref 13.0–17.0)
Lymphocytes Relative: 14.5 % (ref 12.0–46.0)
Lymphs Abs: 1 10*3/uL (ref 0.7–4.0)
MCHC: 32.3 g/dL (ref 30.0–36.0)
MCV: 88.3 fl (ref 78.0–100.0)
Monocytes Absolute: 0.6 10*3/uL (ref 0.1–1.0)
Monocytes Relative: 9.2 % (ref 3.0–12.0)
Neutro Abs: 5.1 10*3/uL (ref 1.4–7.7)
Neutrophils Relative %: 73.2 % (ref 43.0–77.0)
Platelets: 182 10*3/uL (ref 150.0–400.0)
RBC: 3.66 Mil/uL — ABNORMAL LOW (ref 4.22–5.81)
RDW: 18.5 % — ABNORMAL HIGH (ref 11.5–15.5)
WBC: 7 10*3/uL (ref 4.0–10.5)

## 2022-12-28 LAB — COMPREHENSIVE METABOLIC PANEL
ALT: 6 U/L (ref 0–53)
AST: 13 U/L (ref 0–37)
Albumin: 3.9 g/dL (ref 3.5–5.2)
Alkaline Phosphatase: 82 U/L (ref 39–117)
BUN: 30 mg/dL — ABNORMAL HIGH (ref 6–23)
CO2: 34 mEq/L — ABNORMAL HIGH (ref 19–32)
Calcium: 9.4 mg/dL (ref 8.4–10.5)
Chloride: 98 mEq/L (ref 96–112)
Creatinine, Ser: 4.78 mg/dL (ref 0.40–1.50)
GFR: 10.44 mL/min — CL (ref >60.00)
Glucose, Bld: 110 mg/dL — ABNORMAL HIGH (ref 70–99)
Potassium: 4.4 mEq/L (ref 3.5–5.1)
Sodium: 142 mEq/L (ref 135–145)
Total Bilirubin: 0.4 mg/dL (ref 0.2–1.2)
Total Protein: 7.1 g/dL (ref 6.0–8.3)

## 2022-12-28 NOTE — Telephone Encounter (Signed)
Critical lab from Austin [name] from [place] lab. Pt has GFR 10.44 and creatinine 4.78.

## 2022-12-29 DIAGNOSIS — D631 Anemia in chronic kidney disease: Secondary | ICD-10-CM | POA: Diagnosis not present

## 2022-12-29 DIAGNOSIS — J1282 Pneumonia due to coronavirus disease 2019: Secondary | ICD-10-CM | POA: Diagnosis not present

## 2022-12-29 DIAGNOSIS — I110 Hypertensive heart disease with heart failure: Secondary | ICD-10-CM | POA: Diagnosis not present

## 2022-12-29 DIAGNOSIS — J9601 Acute respiratory failure with hypoxia: Secondary | ICD-10-CM | POA: Diagnosis not present

## 2022-12-29 DIAGNOSIS — I2510 Atherosclerotic heart disease of native coronary artery without angina pectoris: Secondary | ICD-10-CM | POA: Diagnosis not present

## 2022-12-29 DIAGNOSIS — I5043 Acute on chronic combined systolic (congestive) and diastolic (congestive) heart failure: Secondary | ICD-10-CM | POA: Diagnosis not present

## 2022-12-29 DIAGNOSIS — E1122 Type 2 diabetes mellitus with diabetic chronic kidney disease: Secondary | ICD-10-CM | POA: Diagnosis not present

## 2022-12-29 DIAGNOSIS — U071 COVID-19: Secondary | ICD-10-CM | POA: Diagnosis not present

## 2022-12-29 DIAGNOSIS — N186 End stage renal disease: Secondary | ICD-10-CM | POA: Diagnosis not present

## 2022-12-30 ENCOUNTER — Telehealth: Payer: Self-pay | Admitting: Adult Health

## 2022-12-30 DIAGNOSIS — I5033 Acute on chronic diastolic (congestive) heart failure: Secondary | ICD-10-CM | POA: Diagnosis not present

## 2022-12-30 DIAGNOSIS — N186 End stage renal disease: Secondary | ICD-10-CM | POA: Diagnosis not present

## 2022-12-30 DIAGNOSIS — R531 Weakness: Secondary | ICD-10-CM | POA: Diagnosis not present

## 2022-12-30 DIAGNOSIS — Z992 Dependence on renal dialysis: Secondary | ICD-10-CM | POA: Diagnosis not present

## 2022-12-30 DIAGNOSIS — I509 Heart failure, unspecified: Secondary | ICD-10-CM | POA: Diagnosis not present

## 2022-12-30 DIAGNOSIS — J9601 Acute respiratory failure with hypoxia: Secondary | ICD-10-CM | POA: Diagnosis not present

## 2022-12-30 NOTE — Telephone Encounter (Signed)
Called [employer] and they were requesting O2 orders. Advised that I spoke with someone regarding this and informed them that it would need to come from pulmonary. [employer] representative notified of update.

## 2022-12-30 NOTE — Telephone Encounter (Signed)
Boneta Lucks with [employer] [phone] ext [phone]  FU on written order   Please return her call.

## 2023-01-02 ENCOUNTER — Telehealth: Payer: Self-pay

## 2023-01-02 ENCOUNTER — Ambulatory Visit: Payer: Medicare PPO | Admitting: Podiatry

## 2023-01-02 DIAGNOSIS — Z992 Dependence on renal dialysis: Secondary | ICD-10-CM | POA: Diagnosis not present

## 2023-01-02 DIAGNOSIS — N186 End stage renal disease: Secondary | ICD-10-CM | POA: Diagnosis not present

## 2023-01-02 NOTE — Telephone Encounter (Signed)
Attempt to contact patient regarding transportation request for 5/14. Unable to reach patient to discuss ambulatory status as previous transportation request are for a standard vehicle which requires 3 business day notice to match with an available provider.  Smitty Knudsen [place]  [facility] Population Health Manager, Patient and [facility] Transformation

## 2023-01-03 ENCOUNTER — Telehealth: Payer: Self-pay | Admitting: *Deleted

## 2023-01-03 ENCOUNTER — Ambulatory Visit: Payer: Medicare PPO | Admitting: Adult Health

## 2023-01-03 ENCOUNTER — Telehealth: Payer: Self-pay

## 2023-01-03 DIAGNOSIS — U071 COVID-19: Secondary | ICD-10-CM | POA: Diagnosis not present

## 2023-01-03 DIAGNOSIS — J1282 Pneumonia due to coronavirus disease 2019: Secondary | ICD-10-CM | POA: Diagnosis not present

## 2023-01-03 DIAGNOSIS — E1122 Type 2 diabetes mellitus with diabetic chronic kidney disease: Secondary | ICD-10-CM | POA: Diagnosis not present

## 2023-01-03 DIAGNOSIS — N186 End stage renal disease: Secondary | ICD-10-CM | POA: Diagnosis not present

## 2023-01-03 DIAGNOSIS — D631 Anemia in chronic kidney disease: Secondary | ICD-10-CM | POA: Diagnosis not present

## 2023-01-03 DIAGNOSIS — I5043 Acute on chronic combined systolic (congestive) and diastolic (congestive) heart failure: Secondary | ICD-10-CM | POA: Diagnosis not present

## 2023-01-03 DIAGNOSIS — I2510 Atherosclerotic heart disease of native coronary artery without angina pectoris: Secondary | ICD-10-CM | POA: Diagnosis not present

## 2023-01-03 DIAGNOSIS — J9601 Acute respiratory failure with hypoxia: Secondary | ICD-10-CM | POA: Diagnosis not present

## 2023-01-03 DIAGNOSIS — I110 Hypertensive heart disease with heart failure: Secondary | ICD-10-CM | POA: Diagnosis not present

## 2023-01-03 NOTE — Telephone Encounter (Signed)
Telephone encounter was:  Successful.  01/03/2023 Name: Austin [name] MRN: [id number] DOB: 07-Nov-1934  Austin [name] is a 88 y.o. year old male who is a primary care patient of [name], NP . The community resource team was consulted for assistance with Transportation Needs   Care guide performed the following interventions: Patient provided with information about care guide support team and interviewed to confirm resource needs.01/05/2023 ride for appointment. pick up time is 10:20.   Follow Up Plan:  No further follow up planned at this time. The patient has been provided with needed resources.    Austin [name] [facility] Guide, [facility] [phone] [address] [place], [place], [place] [address] Phone: [phone] Email: [email or web address]     Austin [name] DOB: December 08, 1934 MRN: [id number]   RIDER WAIVER AND RELEASE OF LIABILITY  For purposes of improving physical access to our facilities, [facility] is pleased to partner with third parties to provide [facility] patients or other authorized individuals the option of convenient, on-demand ground transportation services (the [employer]") through use of the technology service that enables users to request on-demand ground transportation from independent third-party providers.  By opting to use and accept these [employer], I, the undersigned, hereby agree on behalf of myself, and on behalf of any minor child using the Science writer for whom I am the parent or legal guardian, as follows:  Science writer provided to me are provided by independent third-party transportation providers who are not [employer] or employees and who are unaffiliated with [employer]. [facility] is neither a transportation carrier nor a common or public carrier. [facility] has no control over the quality or safety of the transportation that occurs as a result of the [employer]. [facility] cannot  guarantee that any third-party transportation provider will complete any arranged transportation service. [facility] makes no representation, warranty, or guarantee regarding the reliability, timeliness, quality, safety, suitability, or availability of any of the Transport Services or that they will be error free. I fully understand that traveling by vehicle involves risks and dangers of serious bodily injury, including permanent disability, paralysis, and death. I agree, on behalf of myself and on behalf of any minor child using the Transport Services for whom I am the parent or legal guardian, that the entire risk arising out of my use of the [employer] remains solely with me, to the maximum extent permitted under applicable law. The [employer] are provided "as is" and "as available." [facility] disclaims all representations and warranties, express, implied or statutory, not expressly set out in these terms, including the implied warranties of merchantability and fitness for a particular purpose. I hereby waive and release [facility], its agents, employees, officers, directors, representatives, insurers, attorneys, assigns, successors, subsidiaries, and affiliates from any and all past, present, or future claims, demands, liabilities, actions, causes of action, or suits of any kind directly or indirectly arising from acceptance and use of the [employer]. I further waive and release [facility] and its affiliates from all present and future liability and responsibility for any injury or death to persons or damages to property caused by or related to the use of the [employer]. I have read this Waiver and Release of Liability, and I understand the terms used in it and their legal significance. This Waiver is freely and voluntarily given with the understanding that my right (as well as the right of any minor child for whom I am the  parent or legal guardian using the Futures trader) to legal recourse against [facility] in connection with the [employer] is knowingly surrendered in return for use of these services.   I attest that I read the consent document to Austin [name], gave Austin [name] the opportunity to ask questions and answered the questions asked (if any). I affirm that Austin [name] then provided consent for he's participation in this program.     Austin [name]

## 2023-01-03 NOTE — Telephone Encounter (Signed)
   Telephone encounter was:  Successful.  01/03/2023 Name: Evelyn Kuyper MRN: [id number] DOB: 04-26-1935  Yoshito Poznanski is a 88 y.o. year old male who is a primary care patient of [name], NP . The community resource team was consulted for assistance with Transportation Needs   Care guide performed the following interventions: Follow up call placed to the patient to discuss status of referral. patient has scheduled transportation for appts that he had left on my voicemail  Follow Up Plan:  No follow up needed   Yehuda Mao Greenauer -[facility] [place], Population Health [phone] [address] [place] , [place] [address] Email : [email or web address]

## 2023-01-04 DIAGNOSIS — Z992 Dependence on renal dialysis: Secondary | ICD-10-CM | POA: Diagnosis not present

## 2023-01-04 DIAGNOSIS — N186 End stage renal disease: Secondary | ICD-10-CM | POA: Diagnosis not present

## 2023-01-05 DIAGNOSIS — Z452 Encounter for adjustment and management of vascular access device: Secondary | ICD-10-CM | POA: Diagnosis not present

## 2023-01-05 DIAGNOSIS — Z992 Dependence on renal dialysis: Secondary | ICD-10-CM | POA: Diagnosis not present

## 2023-01-05 DIAGNOSIS — N186 End stage renal disease: Secondary | ICD-10-CM | POA: Diagnosis not present

## 2023-01-06 DIAGNOSIS — Z992 Dependence on renal dialysis: Secondary | ICD-10-CM | POA: Diagnosis not present

## 2023-01-06 DIAGNOSIS — N186 End stage renal disease: Secondary | ICD-10-CM | POA: Diagnosis not present

## 2023-01-07 DIAGNOSIS — R262 Difficulty in walking, not elsewhere classified: Secondary | ICD-10-CM | POA: Diagnosis not present

## 2023-01-07 DIAGNOSIS — E43 Unspecified severe protein-calorie malnutrition: Secondary | ICD-10-CM | POA: Diagnosis not present

## 2023-01-09 DIAGNOSIS — N186 End stage renal disease: Secondary | ICD-10-CM | POA: Diagnosis not present

## 2023-01-09 DIAGNOSIS — Z992 Dependence on renal dialysis: Secondary | ICD-10-CM | POA: Diagnosis not present

## 2023-01-10 DIAGNOSIS — I110 Hypertensive heart disease with heart failure: Secondary | ICD-10-CM | POA: Diagnosis not present

## 2023-01-10 DIAGNOSIS — J1282 Pneumonia due to coronavirus disease 2019: Secondary | ICD-10-CM | POA: Diagnosis not present

## 2023-01-10 DIAGNOSIS — I2510 Atherosclerotic heart disease of native coronary artery without angina pectoris: Secondary | ICD-10-CM | POA: Diagnosis not present

## 2023-01-10 DIAGNOSIS — I5043 Acute on chronic combined systolic (congestive) and diastolic (congestive) heart failure: Secondary | ICD-10-CM | POA: Diagnosis not present

## 2023-01-10 DIAGNOSIS — J9601 Acute respiratory failure with hypoxia: Secondary | ICD-10-CM | POA: Diagnosis not present

## 2023-01-10 DIAGNOSIS — E1122 Type 2 diabetes mellitus with diabetic chronic kidney disease: Secondary | ICD-10-CM | POA: Diagnosis not present

## 2023-01-10 DIAGNOSIS — U071 COVID-19: Secondary | ICD-10-CM | POA: Diagnosis not present

## 2023-01-10 DIAGNOSIS — N186 End stage renal disease: Secondary | ICD-10-CM | POA: Diagnosis not present

## 2023-01-10 DIAGNOSIS — D631 Anemia in chronic kidney disease: Secondary | ICD-10-CM | POA: Diagnosis not present

## 2023-01-11 DIAGNOSIS — N186 End stage renal disease: Secondary | ICD-10-CM | POA: Diagnosis not present

## 2023-01-11 DIAGNOSIS — Z992 Dependence on renal dialysis: Secondary | ICD-10-CM | POA: Diagnosis not present

## 2023-01-12 DIAGNOSIS — J9601 Acute respiratory failure with hypoxia: Secondary | ICD-10-CM | POA: Diagnosis not present

## 2023-01-12 DIAGNOSIS — I5043 Acute on chronic combined systolic (congestive) and diastolic (congestive) heart failure: Secondary | ICD-10-CM | POA: Diagnosis not present

## 2023-01-12 DIAGNOSIS — D631 Anemia in chronic kidney disease: Secondary | ICD-10-CM | POA: Diagnosis not present

## 2023-01-12 DIAGNOSIS — N186 End stage renal disease: Secondary | ICD-10-CM | POA: Diagnosis not present

## 2023-01-12 DIAGNOSIS — H6123 Impacted cerumen, bilateral: Secondary | ICD-10-CM | POA: Diagnosis not present

## 2023-01-12 DIAGNOSIS — J1282 Pneumonia due to coronavirus disease 2019: Secondary | ICD-10-CM | POA: Diagnosis not present

## 2023-01-12 DIAGNOSIS — I2510 Atherosclerotic heart disease of native coronary artery without angina pectoris: Secondary | ICD-10-CM | POA: Diagnosis not present

## 2023-01-12 DIAGNOSIS — E1122 Type 2 diabetes mellitus with diabetic chronic kidney disease: Secondary | ICD-10-CM | POA: Diagnosis not present

## 2023-01-12 DIAGNOSIS — U071 COVID-19: Secondary | ICD-10-CM | POA: Diagnosis not present

## 2023-01-12 DIAGNOSIS — I110 Hypertensive heart disease with heart failure: Secondary | ICD-10-CM | POA: Diagnosis not present

## 2023-01-13 DIAGNOSIS — N186 End stage renal disease: Secondary | ICD-10-CM | POA: Diagnosis not present

## 2023-01-13 DIAGNOSIS — Z992 Dependence on renal dialysis: Secondary | ICD-10-CM | POA: Diagnosis not present

## 2023-01-16 DIAGNOSIS — Z992 Dependence on renal dialysis: Secondary | ICD-10-CM | POA: Diagnosis not present

## 2023-01-16 DIAGNOSIS — N186 End stage renal disease: Secondary | ICD-10-CM | POA: Diagnosis not present

## 2023-01-18 DIAGNOSIS — Z992 Dependence on renal dialysis: Secondary | ICD-10-CM | POA: Diagnosis not present

## 2023-01-18 DIAGNOSIS — N186 End stage renal disease: Secondary | ICD-10-CM | POA: Diagnosis not present

## 2023-01-20 DIAGNOSIS — I110 Hypertensive heart disease with heart failure: Secondary | ICD-10-CM | POA: Diagnosis not present

## 2023-01-20 DIAGNOSIS — J9601 Acute respiratory failure with hypoxia: Secondary | ICD-10-CM | POA: Diagnosis not present

## 2023-01-20 DIAGNOSIS — N186 End stage renal disease: Secondary | ICD-10-CM | POA: Diagnosis not present

## 2023-01-20 DIAGNOSIS — U071 COVID-19: Secondary | ICD-10-CM | POA: Diagnosis not present

## 2023-01-20 DIAGNOSIS — Z992 Dependence on renal dialysis: Secondary | ICD-10-CM | POA: Diagnosis not present

## 2023-01-20 DIAGNOSIS — D631 Anemia in chronic kidney disease: Secondary | ICD-10-CM | POA: Diagnosis not present

## 2023-01-20 DIAGNOSIS — I2510 Atherosclerotic heart disease of native coronary artery without angina pectoris: Secondary | ICD-10-CM | POA: Diagnosis not present

## 2023-01-20 DIAGNOSIS — E1022 Type 1 diabetes mellitus with diabetic chronic kidney disease: Secondary | ICD-10-CM | POA: Diagnosis not present

## 2023-01-20 DIAGNOSIS — J1282 Pneumonia due to coronavirus disease 2019: Secondary | ICD-10-CM | POA: Diagnosis not present

## 2023-01-20 DIAGNOSIS — I5043 Acute on chronic combined systolic (congestive) and diastolic (congestive) heart failure: Secondary | ICD-10-CM | POA: Diagnosis not present

## 2023-01-20 DIAGNOSIS — E1122 Type 2 diabetes mellitus with diabetic chronic kidney disease: Secondary | ICD-10-CM | POA: Diagnosis not present

## 2023-01-23 DIAGNOSIS — N186 End stage renal disease: Secondary | ICD-10-CM | POA: Diagnosis not present

## 2023-01-23 DIAGNOSIS — Z992 Dependence on renal dialysis: Secondary | ICD-10-CM | POA: Diagnosis not present

## 2023-01-24 ENCOUNTER — Other Ambulatory Visit: Payer: Self-pay | Admitting: Adult Health

## 2023-01-24 ENCOUNTER — Encounter: Payer: Self-pay | Admitting: Family Medicine

## 2023-01-24 ENCOUNTER — Ambulatory Visit: Payer: Medicare PPO | Admitting: Family Medicine

## 2023-01-24 VITALS — BP 118/48 | HR 76 | Temp 98.9°F | Wt 159.8 lb

## 2023-01-24 DIAGNOSIS — I10 Essential (primary) hypertension: Secondary | ICD-10-CM

## 2023-01-24 DIAGNOSIS — G629 Polyneuropathy, unspecified: Secondary | ICD-10-CM

## 2023-01-24 DIAGNOSIS — G5601 Carpal tunnel syndrome, right upper limb: Secondary | ICD-10-CM | POA: Diagnosis not present

## 2023-01-24 DIAGNOSIS — N186 End stage renal disease: Secondary | ICD-10-CM

## 2023-01-24 LAB — MAGNESIUM: Magnesium: 2 mg/dL (ref 1.5–2.5)

## 2023-01-24 LAB — VITAMIN B12: Vitamin B-12: 250 pg/mL (ref 211–911)

## 2023-01-24 NOTE — Progress Notes (Signed)
   Subjective:    Patient ID: Austin [name], male    DOB: [birth year], 88 y.o.   MRN: [id number]  HPI Here for cramps in his right hand that started several months ago and which have gotten worse in the past few weeks. He started on dialysis several weeks ago, and this seems to have worsened his symptoms. His fingers will curl up so that he cannot straighten them, and when this happens it is very painful. He also has some numbness and tingling in the hand, as well as weakness that comes and goes. No swelling. No joint pains. He had lab work done here on 12-27-22 showing a normal potassium level.    Review of Systems  Constitutional: Negative.   Respiratory: Negative.    Cardiovascular: Negative.   Musculoskeletal:  Positive for arthralgias.  Neurological:  Positive for weakness and numbness.       Objective:   Physical Exam Constitutional:      Appearance: Normal appearance.  Cardiovascular:     Rate and Rhythm: Normal rate and regular rhythm.     Pulses: Normal pulses.     Heart sounds: Normal heart sounds.  Pulmonary:     Effort: Pulmonary effort is normal.     Breath sounds: Normal breath sounds.  Musculoskeletal:     Comments: Right hand appears normal. Skin is warm, radial pulses are full. No swelling or tenderness.   Neurological:     Mental Status: He is alert.           Assessment & Plan:  He seems to have a carpal tunnel syndrome, and the dialysis may have worsened this. We will check levels of B12 and magnesium today. We will set up a nerve conduction study. He will start taking OTC magnesium tablets for 800 mg daily.  Gershon Crane, MD

## 2023-01-25 DIAGNOSIS — N186 End stage renal disease: Secondary | ICD-10-CM | POA: Diagnosis not present

## 2023-01-25 DIAGNOSIS — Z992 Dependence on renal dialysis: Secondary | ICD-10-CM | POA: Diagnosis not present

## 2023-01-26 ENCOUNTER — Telehealth: Payer: Self-pay | Admitting: Adult Health

## 2023-01-26 DIAGNOSIS — D631 Anemia in chronic kidney disease: Secondary | ICD-10-CM | POA: Diagnosis not present

## 2023-01-26 DIAGNOSIS — U071 COVID-19: Secondary | ICD-10-CM | POA: Diagnosis not present

## 2023-01-26 DIAGNOSIS — I110 Hypertensive heart disease with heart failure: Secondary | ICD-10-CM | POA: Diagnosis not present

## 2023-01-26 DIAGNOSIS — I2510 Atherosclerotic heart disease of native coronary artery without angina pectoris: Secondary | ICD-10-CM | POA: Diagnosis not present

## 2023-01-26 DIAGNOSIS — E1122 Type 2 diabetes mellitus with diabetic chronic kidney disease: Secondary | ICD-10-CM | POA: Diagnosis not present

## 2023-01-26 DIAGNOSIS — J1282 Pneumonia due to coronavirus disease 2019: Secondary | ICD-10-CM | POA: Diagnosis not present

## 2023-01-26 DIAGNOSIS — J9601 Acute respiratory failure with hypoxia: Secondary | ICD-10-CM | POA: Diagnosis not present

## 2023-01-26 DIAGNOSIS — N186 End stage renal disease: Secondary | ICD-10-CM | POA: Diagnosis not present

## 2023-01-26 DIAGNOSIS — I5043 Acute on chronic combined systolic (congestive) and diastolic (congestive) heart failure: Secondary | ICD-10-CM | POA: Diagnosis not present

## 2023-01-26 NOTE — Telephone Encounter (Signed)
Pt is calling and also would like blood work result

## 2023-01-26 NOTE — Telephone Encounter (Signed)
Pt states that he has not been sleeping since he started taking magnesium, pt requests for something else stronger for pain that will not keep him up at night. Please advise

## 2023-01-26 NOTE — Telephone Encounter (Signed)
He can try applying Voltaren gel to the hand up to 4 times a day as needed. Also he can wear an OTC wrist splint. Otherwise follow up with [facility]

## 2023-01-26 NOTE — Telephone Encounter (Signed)
Pt is calling and was seen by dr fry on 01-24-2023. Pt started magnesium 800 mg and the med did not help with his hand pain also pt has not been able to have full night sleep. Please advise

## 2023-01-27 DIAGNOSIS — Z992 Dependence on renal dialysis: Secondary | ICD-10-CM | POA: Diagnosis not present

## 2023-01-27 DIAGNOSIS — N186 End stage renal disease: Secondary | ICD-10-CM | POA: Diagnosis not present

## 2023-01-27 NOTE — Telephone Encounter (Signed)
Pt is aware of lab results.

## 2023-01-27 NOTE — Telephone Encounter (Signed)
Spoke with pt wife verbalized understanding of Dr Clent Ridges recommendation

## 2023-01-28 ENCOUNTER — Other Ambulatory Visit: Payer: Self-pay | Admitting: Adult Health

## 2023-01-30 DIAGNOSIS — I509 Heart failure, unspecified: Secondary | ICD-10-CM | POA: Diagnosis not present

## 2023-01-30 DIAGNOSIS — Z992 Dependence on renal dialysis: Secondary | ICD-10-CM | POA: Diagnosis not present

## 2023-01-30 DIAGNOSIS — J9601 Acute respiratory failure with hypoxia: Secondary | ICD-10-CM | POA: Diagnosis not present

## 2023-01-30 DIAGNOSIS — N186 End stage renal disease: Secondary | ICD-10-CM | POA: Diagnosis not present

## 2023-01-30 DIAGNOSIS — R531 Weakness: Secondary | ICD-10-CM | POA: Diagnosis not present

## 2023-01-30 DIAGNOSIS — I5033 Acute on chronic diastolic (congestive) heart failure: Secondary | ICD-10-CM | POA: Diagnosis not present

## 2023-01-31 ENCOUNTER — Encounter: Payer: Self-pay | Admitting: Neurology

## 2023-01-31 ENCOUNTER — Telehealth: Payer: Self-pay | Admitting: Emergency Medicine

## 2023-01-31 NOTE — Telephone Encounter (Signed)
pt calling in to get sch for his CT scan and Overdue f/u

## 2023-02-01 DIAGNOSIS — N186 End stage renal disease: Secondary | ICD-10-CM | POA: Diagnosis not present

## 2023-02-01 DIAGNOSIS — Z992 Dependence on renal dialysis: Secondary | ICD-10-CM | POA: Diagnosis not present

## 2023-02-01 NOTE — Telephone Encounter (Addendum)
Rescheduled pt's CT to 7/1.  Called and spoke to him and he states has dialysis on Mon, Wed, Fri and can't go on 7/1.  Is getting ready to go to dialysis now and won't be able to answer phone.  Rescheduled CT to 7/11 and went ahead and scheduled follow up with RB on 7/18.  Called pt's home # and phone just rings.  Called cell and pt states getting ready to leave and call wife at [phone].  Called her and vm is full.  Will have to call back later.

## 2023-02-01 NOTE — Telephone Encounter (Signed)
Tried to call home # again and it just rings.  Tried wife's # and no answer - vm is still full.

## 2023-02-02 ENCOUNTER — Other Ambulatory Visit: Payer: Self-pay

## 2023-02-02 DIAGNOSIS — N186 End stage renal disease: Secondary | ICD-10-CM | POA: Diagnosis not present

## 2023-02-02 DIAGNOSIS — I2510 Atherosclerotic heart disease of native coronary artery without angina pectoris: Secondary | ICD-10-CM | POA: Diagnosis not present

## 2023-02-02 DIAGNOSIS — R202 Paresthesia of skin: Secondary | ICD-10-CM

## 2023-02-02 DIAGNOSIS — J1282 Pneumonia due to coronavirus disease 2019: Secondary | ICD-10-CM | POA: Diagnosis not present

## 2023-02-02 DIAGNOSIS — E1122 Type 2 diabetes mellitus with diabetic chronic kidney disease: Secondary | ICD-10-CM | POA: Diagnosis not present

## 2023-02-02 DIAGNOSIS — I5043 Acute on chronic combined systolic (congestive) and diastolic (congestive) heart failure: Secondary | ICD-10-CM | POA: Diagnosis not present

## 2023-02-02 DIAGNOSIS — J9601 Acute respiratory failure with hypoxia: Secondary | ICD-10-CM | POA: Diagnosis not present

## 2023-02-02 DIAGNOSIS — U071 COVID-19: Secondary | ICD-10-CM | POA: Diagnosis not present

## 2023-02-02 DIAGNOSIS — I110 Hypertensive heart disease with heart failure: Secondary | ICD-10-CM | POA: Diagnosis not present

## 2023-02-02 DIAGNOSIS — D631 Anemia in chronic kidney disease: Secondary | ICD-10-CM | POA: Diagnosis not present

## 2023-02-02 NOTE — Telephone Encounter (Signed)
I called and spoke with the pt and gave him CT appt info and the appt for RB. Pt verbalized understanding and was okay with these dates. Nothing further needed.

## 2023-02-03 DIAGNOSIS — Z992 Dependence on renal dialysis: Secondary | ICD-10-CM | POA: Diagnosis not present

## 2023-02-03 DIAGNOSIS — N186 End stage renal disease: Secondary | ICD-10-CM | POA: Diagnosis not present

## 2023-02-06 DIAGNOSIS — N186 End stage renal disease: Secondary | ICD-10-CM | POA: Diagnosis not present

## 2023-02-06 DIAGNOSIS — Z992 Dependence on renal dialysis: Secondary | ICD-10-CM | POA: Diagnosis not present

## 2023-02-06 IMAGING — PT NM PET TUM IMG RESTAG (PS) SKULL BASE T - THIGH
1 of 7 series · 1 of 25 positions shown · non-contrast
Comparison: Prior PET scan from Tuesday October, 2020 and numerous prior CT
images including CT of the chest of the same date and from [REDACTED]
a select [REDACTED].

CLINICAL DATA: Subsequent treatment strategy for pulmonary nodule
previously shown to have enlargement in an 86-year-old male.

EXAM:
NUCLEAR MEDICINE PET SKULL BASE TO THIGH
TECHNIQUE: 9.36 mCi F-18 FDG was injected intravenously. Full-ring PET imaging
was performed from the skull base to thigh after the radiotracer. CT
data was obtained and used for attenuation correction and anatomic
localization.
Fasting blood glucose: 109 mg/dl

[Series 4: ct sk_thigh 5.0 bf37 · axial · 5.0mm · 0.98mm/px · 1 of 215 slices shown]
[im 215/215  brain]
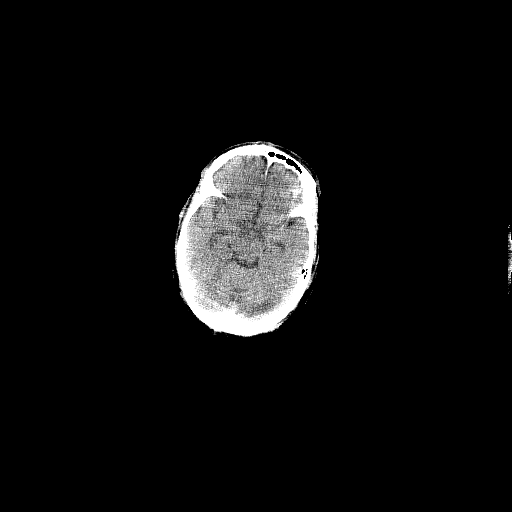

[1 of 25 positions shown; findings below may reference images not displayed]

FINDINGS: Mediastinal blood pool activity: SUV max

Liver activity: SUV max NA

NECK: No hypermetabolic lymph nodes in the neck.

Incidental CT findings: RIGHT maxillary sinus disease.

CHEST: Area of concern in the RIGHT upper lobe as on the CT acquired
on the same date and on prior CT exams measuring 2.4 x 1.8 cm
dimension on the CT portion of the exam it shows a maximum SUV on
today's study of 2.71 as compared to 2.1 on the previous PET. It has
enlarged since Saturday August, 2020., on the CT of Tuesday October, 2020 it
measured approximately 2.0 x 1.7 cm when measured in a similar
fashion. Clearly enlarged since 2711. Small ground-glass nodule in
the peripheral LEFT chest (image 64/4) 8 mm with a maximum SUV of
1.29 with sub solid features previously with a maximum SUV of 1 and
more conspicuous perhaps on today's study than on previous imaging
this is in a subpleural location and abuts the major fissure in the
RIGHT chest. (Image 60/4).

Incidental CT findings: Cardiac enlargement similar to prior
imaging. Small pericardial effusion also unchanged. Three-vessel
coronary artery disease. Calcified aortic atherosclerosis with
approximately 4 cm ascending thoracic aorta. Central pulmonary
vasculature unremarkable on noncontrast imaging no signs of
additional suspicious nodule. Small bilateral pleural effusions.

No pathologically enlarged lymph nodes in the chest or lymph nodes
with abnormal metabolic activity.

ABDOMEN/PELVIS: No abnormal hypermetabolic activity within the
liver, pancreas, adrenal glands, or spleen. No hypermetabolic lymph
nodes in the abdomen or pelvis.

Incidental CT findings: No acute findings relative liver,
gallbladder, pancreas, spleen, adrenal glands or kidneys. Stable
adrenal adenomas and LEFT adrenal hyperplasia. Adrenal adenoma on
the RIGHT measuring 2 x 2.1 cm. Renal cysts on the RIGHT. No
hydronephrosis. Urinary bladder is decompressed. Post prostatectomy.
Is aortic atherosclerosis. No acute gastrointestinal findings.

SKELETON: No focal hypermetabolic activity to suggest skeletal
metastasis.

Incidental CT findings: Spinal degenerative changes.
IMPRESSION: RIGHT upper lobe perifissural nodule abutting the pleural surface
and fissure, showing increase in size since 2711 slight increase in
size since Saturday August, 2020 and with slight increase in metabolic
activity, since the previous PET exam, though still only slightly
greater than mediastinal blood pool remains suspicious for
bronchogenic neoplasm.

LEFT upper lobe subsolid nodule perhaps slightly more conspicuous
also with very low level metabolic activity, persistence raises the
question of multifocal bronchogenic neoplasm.

No signs of metastatic disease.

Marked cardiomegaly and coronary artery disease.

4.1 cm ascending thoracic aorta with aortic atherosclerosis.
Recommend annual imaging followup by CTA or MRA. This recommendation
follows 6868 ACCF/AHA/AATS/ACR/ASA/SCA/VINH/RELOS/FREDERIC/NYA Guidelines
for the Diagnosis and Management of Patients with Thoracic Aortic
Disease. Circulation. 6868; 121: [id number]. Aortic aneurysm NOS
(3YJZC-YXF.H)

Post prostatectomy.

Aortic Atherosclerosis ([id number]-RJ2.2).

## 2023-02-07 DIAGNOSIS — R262 Difficulty in walking, not elsewhere classified: Secondary | ICD-10-CM | POA: Diagnosis not present

## 2023-02-07 DIAGNOSIS — I110 Hypertensive heart disease with heart failure: Secondary | ICD-10-CM | POA: Diagnosis not present

## 2023-02-07 DIAGNOSIS — N186 End stage renal disease: Secondary | ICD-10-CM | POA: Diagnosis not present

## 2023-02-07 DIAGNOSIS — E1122 Type 2 diabetes mellitus with diabetic chronic kidney disease: Secondary | ICD-10-CM | POA: Diagnosis not present

## 2023-02-07 DIAGNOSIS — D631 Anemia in chronic kidney disease: Secondary | ICD-10-CM | POA: Diagnosis not present

## 2023-02-07 DIAGNOSIS — J1282 Pneumonia due to coronavirus disease 2019: Secondary | ICD-10-CM | POA: Diagnosis not present

## 2023-02-07 DIAGNOSIS — I2510 Atherosclerotic heart disease of native coronary artery without angina pectoris: Secondary | ICD-10-CM | POA: Diagnosis not present

## 2023-02-07 DIAGNOSIS — U071 COVID-19: Secondary | ICD-10-CM | POA: Diagnosis not present

## 2023-02-07 DIAGNOSIS — E43 Unspecified severe protein-calorie malnutrition: Secondary | ICD-10-CM | POA: Diagnosis not present

## 2023-02-07 DIAGNOSIS — I5043 Acute on chronic combined systolic (congestive) and diastolic (congestive) heart failure: Secondary | ICD-10-CM | POA: Diagnosis not present

## 2023-02-07 DIAGNOSIS — J9601 Acute respiratory failure with hypoxia: Secondary | ICD-10-CM | POA: Diagnosis not present

## 2023-02-08 DIAGNOSIS — Z992 Dependence on renal dialysis: Secondary | ICD-10-CM | POA: Diagnosis not present

## 2023-02-08 DIAGNOSIS — N186 End stage renal disease: Secondary | ICD-10-CM | POA: Diagnosis not present

## 2023-02-09 ENCOUNTER — Ambulatory Visit (INDEPENDENT_AMBULATORY_CARE_PROVIDER_SITE_OTHER): Payer: Medicare PPO | Admitting: Family Medicine

## 2023-02-09 VITALS — BP 111/50 | Ht 67.0 in | Wt 155.0 lb

## 2023-02-09 DIAGNOSIS — J1282 Pneumonia due to coronavirus disease 2019: Secondary | ICD-10-CM | POA: Diagnosis not present

## 2023-02-09 DIAGNOSIS — I5043 Acute on chronic combined systolic (congestive) and diastolic (congestive) heart failure: Secondary | ICD-10-CM | POA: Diagnosis not present

## 2023-02-09 DIAGNOSIS — Z0000 Encounter for general adult medical examination without abnormal findings: Secondary | ICD-10-CM

## 2023-02-09 DIAGNOSIS — J9601 Acute respiratory failure with hypoxia: Secondary | ICD-10-CM | POA: Diagnosis not present

## 2023-02-09 DIAGNOSIS — D631 Anemia in chronic kidney disease: Secondary | ICD-10-CM | POA: Diagnosis not present

## 2023-02-09 DIAGNOSIS — U071 COVID-19: Secondary | ICD-10-CM | POA: Diagnosis not present

## 2023-02-09 DIAGNOSIS — E1122 Type 2 diabetes mellitus with diabetic chronic kidney disease: Secondary | ICD-10-CM | POA: Diagnosis not present

## 2023-02-09 DIAGNOSIS — I110 Hypertensive heart disease with heart failure: Secondary | ICD-10-CM | POA: Diagnosis not present

## 2023-02-09 DIAGNOSIS — I2510 Atherosclerotic heart disease of native coronary artery without angina pectoris: Secondary | ICD-10-CM | POA: Diagnosis not present

## 2023-02-09 DIAGNOSIS — N186 End stage renal disease: Secondary | ICD-10-CM | POA: Diagnosis not present

## 2023-02-09 NOTE — Progress Notes (Signed)
PATIENT CHECK-IN and HEALTH RISK ASSESSMENT QUESTIONNAIRE:  -completed by phone/video for upcoming Medicare Preventive Visit  Pre-Visit Check-in: 1)Vitals (height, wt, BP, etc) - record in vitals section for visit on day of visit 2)Review and Update Medications, Allergies PMH, Surgeries, Social history in Epic 3)Hospitalizations in the last year with date/reason? 2 month ago for covid and pneumonia.  No feels has recovered.  4)Review and Update Care Team (patient's specialists) in Epic 5) Complete PHQ9 in Epic  6) Complete Fall Screening in Epic 7)Review all Health Maintenance Due and order under PCP if not done.  Medicare Wellness Patient Questionnaire:  Answer theses question about your habits: Do you drink alcohol? No  Have you ever smoked?Yes  Quit date if applicable? 1976  How many packs a day do/did you smoke? 1 pk a day Do you use smokeless tobacco?No Do you use an illicit drugs?no Do you exercises? Yes, twice a week - doing a series of exercises the physical therapist provided for 30 minutes, he also walks, PT the last few weeks  Are you sexually active? No Typical breakfast: eggs, [place] bacon, toast Typical lunch: some kind of sandwich when do takes lunch. Go to dialysis mon. Wed. ,fri from 1140-  500 Typical dinner: [place], rice, potatoe, salad Typical snacks: gold fish  Beverages: cranberry/apple juice, water  Answer theses question about you: Can you perform most household chores?yes Do you find it hard to follow a conversation in a noisy room?no Do you often ask people to speak up or repeat themselves?no  Do you feel that you have a problem with memory?somewhat. Forget names of actors known for years.  Do you balance your checkbook and or bank acounts?no, wife takes care of it Do you feel safe at home?no Last dentist visit? last month  Do you need assistance with any of the following: Please note if so   Driving? yes  Feeding yourself?  Getting from bed to  chair?  Getting to the toilet?  Bathing or showering?  Dressing yourself?  Managing money? Wife helps  Climbing a flight of stairs  Preparing meals?    Do you have Advanced Directives in place (Living Will, Healthcare Power or Attorney)? Yes   Last eye Exam and location?3 months ago. Appt on 6/25 with [place] [facility].    Do you currently use prescribed or non-prescribed narcotic or opioid pain medications?no  Do you have a history or close family history of breast, ovarian, tubal or peritoneal cancer or a family member with BRCA (breast cancer susceptibility 1 and 2) gene mutations? Yes, lung cancer. Also father died from lung cancer.   Nurse/Assistant Credentials/time stamp: Karpuih M./CMA/206pm   ----------------------------------------------------------------------------------------------------------------------------------------------------------------------------------------------------------------------    MEDICARE ANNUAL PREVENTIVE CARE VISIT WITH PROVIDER (Welcome to Medicare, initial annual wellness or annual wellness exam)  Virtual Visit via Phone Note  I connected with Austin [name] on 02/09/23  by phone and verified that I am speaking with the correct person using two identifiers.  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 Still having symptoms and magnesium did not help - about the same, tingling and pain in right hand/wrist more at night, some weakness as well. He thinks involves the whole hand and wrist and reports shock like pain when opening jars. Reports was told is carpel tunnel and sees neurologist next week.    See HM section in  Epic for other details of completed HM.    ROS: negative for report of fevers, unintentional weight loss, vision changes, vision loss, hearing loss or change,  chest pain, sob, hemoptysis, melena, hematochezia, hematuria, falls, bleeding or bruising, thoughts of suicide or self harm, memory loss  Patient-completed extensive health risk assessment - reviewed and discussed with the patient: See Health Risk Assessment completed with patient prior to the visit either above or in recent phone note. This was reviewed in detailed with the patient today and appropriate recommendations, orders and referrals were placed as needed per Summary below and patient instructions.   Review of Medical History: -PMH, PSH, Family History and current specialty and care providers reviewed and updated and listed below   Patient Care Team: [name], NP as PCP - General (Family Medicine) Rinaldo Cloud, MD as Consulting Physician (Cardiology) Marcelene Butte, MD as Consulting Physician (Ophthalmology) Su Grand, MD (Inactive) as Consulting Physician (Urology) Rachael Fee, MD as Attending Physician (Gastroenterology) Estanislado Emms, MD as Consulting Physician (Nephrology) Alejandro Mulling, RN as [facility] Care Management   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 BLOOD LOSS 03/13/2007   CAROTID ARTERY STENOSIS 05/10/2010   CHF (congestive heart failure) (HCC)    DIABETES MELLITUS, TYPE II 09/19/2007   DISEASE, CEREBROVASCULAR NEC 03/05/2007   GERD 03/13/2007   HYPERLIPIDEMIA 03/05/2007   HYPERTENSION 03/05/2007   HYPOKALEMIA 11/09/2009   KNEE PAIN, RIGHT 11/09/2009   PROSTATE CANCER, HX OF 03/05/2007   RENAL DISEASE, CHRONIC 02/03/2009    Past Surgical History:  Procedure Laterality Date   AV FISTULA PLACEMENT Left 10/18/2022   Procedure: INSERTION OF LEFT ARM BRACHIAL ARTERY TO AXILLARY VEIN ARTERIOVENOUS (AV) GORE-TEX GRAFT;  Surgeon: Victorino Sparrow, MD;  Location: [facility] OR;   Service: Vascular;  Laterality: Left;   CAROTID ARTERY ANGIOPLASTY Right Oct. 10, 2001   ESOPHAGOGASTRODUODENOSCOPY (EGD) WITH PROPOFOL N/A 11/04/2016   Procedure: ESOPHAGOGASTRODUODENOSCOPY (EGD) WITH PROPOFOL;  Surgeon: Kathi Der, MD;  Location: [facility] ENDOSCOPY;  Service: Gastroenterology;  Laterality: N/A;   IR FLUORO GUIDE CV LINE RIGHT  10/25/2022   IR FLUORO GUIDE CV LINE RIGHT  10/27/2022   IR US GUIDE VASC ACCESS RIGHT  10/25/2022   IR US GUIDE VASC ACCESS RIGHT  10/27/2022   LAPAROSCOPIC APPENDECTOMY  02/20/2012   Procedure: APPENDECTOMY LAPAROSCOPIC;  Surgeon: Almond Lint, MD;  Location: [facility] OR;  Service: General;  Laterality: N/A;   PROSTATE SURGERY     prostatectomy    Social History   Socioeconomic History   Marital status: Married    Spouse name: Not on file   Number of children: Not on file   Years of education: Not on file   Highest education level: Not on file  Occupational History   Not on file  Tobacco Use   Smoking status: Former    Packs/day: 1.00    Years: 10.00    Additional pack years: 0.00    Total pack years: 10.00    Types: Cigarettes    Quit date: 08/22/1974    Years since quitting: 48.5   Smokeless tobacco: Never  Vaping Use   Vaping Use: Never used  Substance and Sexual Activity   Alcohol use: No    Alcohol/week: 0.0 standard drinks of alcohol   Drug use: No   Sexual activity: Not Currently  Other Topics Concern   Not on file  Social History Narrative   Retired - Administrator, arts    Married 53 years       He enjoys traveling    International aid/development worker of Corporate investment banker Strain: Low Risk  (02/09/2023)   Overall Financial Resource Strain (CARDIA)    Difficulty of Paying Living Expenses: Not very hard  Food Insecurity: No Food Insecurity (02/09/2023)   Hunger Vital Sign    Worried About Running Out of Food in the Last Year: Never true    Ran Out of Food in the Last Year: Never true  Transportation Needs: No Transportation Needs  (07/27/2022)   PRAPARE - Administrator, Civil Service (Medical): No    Lack of Transportation (Non-Medical): No  Physical Activity: Insufficiently Active (02/09/2023)   Exercise Vital Sign    Days of Exercise per Week: 3 days    Minutes of Exercise per Session: 30 min  Stress: No Stress Concern Present (02/09/2023)   [employer] of Occupational Health - Occupational Stress Questionnaire    Feeling of Stress : Not at all  Social Connections: Moderately Integrated (02/09/2023)   Social Connection and Isolation Panel [NHANES]    Frequency of Communication with Friends and Family: Twice a week    Frequency of Social Gatherings with Friends and Family: Never    Attends Religious Services: More than 4 times per year    Active Member of [employer] or Organizations: Yes    Attends Engineer, structural: More than 4 times per year    Marital Status: Married  Catering manager Violence: Not At Risk (02/10/2022)   Humiliation, Afraid, Rape, and Kick questionnaire    Fear of Current or Ex-Partner: No    Emotionally Abused: No    Physically Abused: No    Sexually Abused: No    Family History  Problem Relation Age of Onset   Hypertension Mother    Cancer Father        Mesothelioma    Stomach cancer Brother    Cancer Brother    Cancer - Cervical Brother    Cancer Brother    Diabetes Brother    Esophageal cancer Neg Hx    Colon cancer Neg Hx    Pancreatic cancer Neg Hx     Current Outpatient Medications on File Prior to Visit  Medication Sig Dispense Refill   ACCU-CHEK GUIDE test strip USE TO CHECK BLOOD GLUCOSE TWICE A DAY OR AS NEEDED 100 strip 1   Accu-Chek Softclix Lancets lancets USED TO CHECK BLOOD GLUCOSE TWICE A DAY OR AS NEEDED 100 each 6   acetaminophen (TYLENOL) 500 MG tablet Take 1,000 mg by mouth every 4 (four) hours as needed.     amLODipine (NORVASC) 10 MG tablet Take 1 tablet (10 mg total) by mouth daily. 90 tablet 3   atorvastatin (LIPITOR) 20 MG tablet  Take 1 tablet (20 mg total) by mouth daily. 90 tablet 3   cholecalciferol (VITAMIN D3) 25 MCG (1000 UNIT) tablet Take 1,000 Units by mouth daily.     cloNIDine (CATAPRES) 0.3 MG tablet Take 1 tablet (0.3 mg total) by mouth 3 (three) times daily. (Patient taking differently: Take 0.3 mg by mouth 2 (two) times daily.) 60 tablet 0   clopidogrel (PLAVIX) 75 MG tablet TAKE 1 TABLET BY MOUTH EVERY DAY (Patient taking differently: Take 75 mg by mouth daily.) 90 tablet 3   doxazosin (CARDURA) 2 MG tablet Take 2 mg  by mouth at bedtime.   3   esomeprazole (NEXIUM) 40 MG capsule TAKE 1 CAPSULE BY MOUTH EVERY DAY 90 capsule 3   guaiFENesin (MUCINEX) 600 MG 12 hr tablet Take 600 mg by mouth at bedtime.     hydrocortisone (ANUSOL-HC) 2.5 % rectal cream Place 1 application rectally 2 (two) times daily. 28 g 3   isosorbide-hydrALAZINE (BIDIL) 20-37.5 MG tablet Take 1 tablet by mouth 3 (three) times daily.     KLOR-CON M10 10 MEQ tablet Take by mouth.     lactulose (CHRONULAC) 10 GM/15ML solution TAKE 30 MLS (20 G TOTAL) BY MOUTH 2 (TWO) TIMES DAILY AS NEEDED. 473 mL 1   methylcellulose (CITRUCEL) oral powder 1 tablespoon every 2-3 days,alternating with lactulose     metoprolol succinate (TOPROL-XL) 100 MG 24 hr tablet Take 1 tablet (100 mg total) by mouth daily. Take with or immediately following a meal. 30 tablet 0   neomycin-polymyxin b-dexamethasone (MAXITROL) 3.5-10000-0.1 SUSP Place 1 drop into both eyes 4 (four) times daily.     Propylene Glycol (SYSTANE BALANCE OP) Place 1 drop into both eyes at bedtime.     torsemide (DEMADEX) 20 MG tablet Take 40 mg by mouth 2 (two) times daily.     traZODone (DESYREL) 50 MG tablet Take 1 tablet (50 mg total) by mouth at bedtime.     triamcinolone cream (KENALOG) 0.1 % Apply 1 application topically daily as needed (dry/irritated skin).     albuterol (VENTOLIN HFA) 108 (90 Base) MCG/ACT inhaler TAKE 2 PUFFS BY MOUTH EVERY 6 HOURS AS NEEDED FOR WHEEZE OR SHORTNESS OF BREATH  (Patient not taking: Reported on 02/09/2023) 8.5 each 5   Melatonin 10 MG TABS Take 10 mg by mouth at bedtime. (Patient not taking: Reported on 02/09/2023)     metolazone (ZAROXOLYN) 5 MG tablet Take 5 mg by mouth daily as needed.     sevelamer carbonate (RENVELA) 800 MG tablet Take 800 mg by mouth 3 (three) times daily. (Patient not taking: Reported on 02/09/2023)     Current Facility-Administered Medications on File Prior to Visit  Medication Dose Route Frequency Provider Last Rate Last Admin   ammonium lactate (LAC-HYDRIN) 12 % lotion   Topical PRN Candelaria Stagers, DPM        Allergies  Allergen Reactions   Aspirin Other (See Comments)    High doses causes stomach ulcer and bleeding       Physical Exam Vitals:   02/09/23 1340  BP: (!) 111/50   Estimated body mass index is 24.28 kg/m as calculated from the following:   Height as of this encounter: 5\' 7"  (1.702 m).   Weight as of this encounter: 155 lb (70.3 kg).  EKG (optional): deferred due to virtual visit  GENERAL: alert, oriented, no acute distress detected; full vision exam deferred due to pandemic and/or virtual encounter  PSYCH/NEURO: pleasant and cooperative, no obvious depression or anxiety, speech and thought processing grossly intact, Cognitive function grossly intact  Flowsheet Row Office Visit [facility]/24/20[facility]  PHQ-9 Total Score 1           01/24/2023    9:58 AM 07/22/2022    9:53 AM 02/10/2022   11:22 AM 09/02/2021    2:01 PM 07/21/2021    1:38 PM  Depression screen PHQ 2/9  Decreased Interest 0 0 0 0 0  Down, Depressed, Hopeless 0 0 0 0 0  PHQ - 2 Score 0 0 0 0  0  Altered sleeping 1      Tired, decreased energy 0      Change in appetite 0      Feeling bad or failure about yourself  0      Trouble concentrating 0      Moving slowly or fidgety/restless 0      Suicidal thoughts 0      PHQ-9 Score 1      Difficult doing work/chores Not difficult at all            02/10/2022   11:25 AM 02/25/2022   11:01 AM 05/12/2022   12:07 PM 01/24/2023    9:57 AM 02/09/2023    1:55 PM  Fall Risk  Falls in the past year? 0   1 1  Was there an injury with Fall? 0   0 0  Fall Risk Category Calculator 0   2 1  Fall Risk Category (Retired) Low      (RETIRED) Patient Fall Risk Level Low fall risk Moderate fall risk Moderate fall risk    Patient at Risk for Falls Due to No Fall Risks   History of fall(s) Other (Comment)  Fall risk Follow up    Falls evaluation completed Falls evaluation completed  Except when had covid and pneumonia - sled down the wall when very sick - this is the only time he has ever fallen and reports balance is good.   SUMMARY AND PLAN:  Encounter for Medicare annual wellness exam   Discussed applicable health maintenance/preventive health measures and advised and referred or ordered per patient preferences: -discussed covid booster recs, reports had booster in the fall Health Maintenance  Topic Date Due   COVID-19 Vaccine (6 - 2023-24 season) 02/25/2023 (Originally 04/22/2022)   INFLUENZA VACCINE  03/23/2023   FOOT EXAM  03/29/2023   HEMOGLOBIN A1C  04/27/2023   OPHTHALMOLOGY EXAM  08/10/2023   Medicare Annual Wellness (AWV)  02/09/2024   Pneumonia Vaccine 38+ Years old  Completed   Zoster Vaccines- Shingrix  Completed   HPV VACCINES  Aged Out   DTaP/Tdap/Td  Discontinued   Education and counseling on the following was provided based on the above review of health and a plan/checklist for the patient, along with additional information discussed, was provided for the patient in the patient instructions :  -for hand discomfort discussed wrist brace, capsaicin topical he could try while awaiting specialist appt to confrim dx and eval, also provided info for ortho walk in clinics in case needs to be seen sooner -Advised and counseled on a healthy lifestyle - including the importance of a healthy diet, regular physical activity, social  connections and stress management. -Reviewed patient's current diet. Advised and counseled on a whole foods based healthy diet. A summary of a healthy diet was provided in the Patient Instructions.  -reviewed patient's current physical activity level and discussed exercise guidelines for adults. Discussed community resources and ideas for safe exercise at home to assist in meeting exercise guideline recommendations in a safe and healthy way.  -Advise yearly dental visits at minimum and regular eye exams   Follow up: see patient instructions   Patient Instructions  I really enjoyed getting to talk with you today! I am available on Tuesdays and Thursdays for virtual visits if you have any questions or concerns, or if I can be of any further assistance.   CHECKLIST FROM ANNUAL WELLNESS VISIT:  -Follow up (please call to schedule if not scheduled after visit):   -  yearly for annual wellness visit with primary care office  Here is a list of your preventive care/health maintenance measures and the plan for each if any are due:  PLAN For any measures below that may be due:   Health Maintenance  Topic Date Due   COVID-19 Vaccine (6 - 2023-24 season) 02/25/2023 (Originally 04/22/2022)   INFLUENZA VACCINE  03/23/2023   FOOT EXAM  03/29/2023   HEMOGLOBIN A1C  04/27/2023   OPHTHALMOLOGY EXAM  08/10/2023   Medicare Annual Wellness (AWV)  02/09/2024   Pneumonia Vaccine 77+ Years old  Completed   Zoster Vaccines- Shingrix  Completed   HPV VACCINES  Aged Out   DTaP/Tdap/Td  Discontinued    -See a dentist at least yearly  -Get your eyes checked and then per your eye specialist's recommendations  -Other issues addressed today:   -I have included below further information regarding a healthy whole foods based diet, physical activity guidelines for adults, stress management and opportunities for social connections. I hope you find this information useful.    -----------------------------------------------------------------------------------------------------------------------------------------------------------------------------------------------------------------------------------------------------------  NUTRITION: -eat real food: lots of colorful vegetables (half the plate) and fruits -5-7 servings of vegetables and fruits per day (fresh or steamed is best), exp. 2 servings of vegetables with lunch and dinner and 2 servings of fruit per day. Berries and greens such as kale and collards are great choices.  -consume on a regular basis: whole grains (make sure first ingredient on label contains the word "whole"), fresh fruits, fish, nuts, seeds, healthy oils (such as olive oil, avocado oil, grape seed oil) -may eat small amounts of dairy and lean meat on occasion, but avoid processed meats such as ham, bacon, lunch meat, etc. -drink water -try to avoid fast food and pre-packaged foods, processed meat -most experts advise limiting sodium to < 2300mg  per day, should limit further is any chronic conditions such as high blood pressure, heart disease, diabetes, etc. The American Heart Association advised that < 1500mg  is is ideal -try to avoid foods that contain any ingredients with names you do not recognize  -try to avoid sugar/sweets (except for the natural sugar that occurs in fresh fruit) -try to avoid sweet drinks -try to avoid white rice, white bread, pasta (unless whole grain), white or yellow potatoes  EXERCISE GUIDELINES FOR ADULTS: -if you wish to increase your physical activity, do so gradually and with the approval of your doctor -STOP and seek medical care immediately if you have any chest pain, chest discomfort or trouble breathing when starting or increasing exercise  -move and stretch your body, legs, feet and arms when sitting for long periods -Physical activity guidelines for optimal health in adults: -least 150 minutes per week of  aerobic exercise (can talk, but not sing) once approved by your doctor, 20-30 minutes of sustained activity or two 10 minute episodes of sustained activity every day.  -resistance training at least 2 days per week if approved by your doctor -balance exercises 3+ days per week:   Stand somewhere where you have something sturdy to hold onto if you lose balance.    1) lift up on toes, start with 5x per day and work up to 20x   2) stand and lift on leg straight out to the side so that foot is a few inches of the floor, start with 5x each side and work up to 20x each side   3) stand on one foot, start with 5 seconds each side and work up to 20 seconds  on each side  If you need ideas or help with getting more active:  -Silver sneakers https://tools.silversneakers.com  -Walk with a Doc: http://www.duncan-williams.com/  -try to include resistance (weight lifting/strength building) and balance exercises twice per week: or the following link for ideas: http://castillo-powell.com/  BuyDucts.dk  STRESS MANAGEMENT: -can try meditating, or just sitting quietly with deep breathing while intentionally relaxing all parts of your body for 5 minutes daily -if you need further help with stress, anxiety or depression please follow up with your primary doctor or contact the wonderful folks at [facility]: [phone]  SOCIAL CONNECTIONS: -options in [place] if you wish to engage in more social and exercise related activities:  -Silver sneakers https://tools.silversneakers.com  -Walk with a Doc: http://www.duncan-williams.com/  -Check out the [facility] Active Adults 50+ section on the [place] of [employer] (hiking clubs, book clubs, cards and games, chess, exercise classes, aquatic classes and much more) - see the website for  details: https://www.[place].gov/departments/parks-recreation/active-adults50  -YouTube has lots of exercise videos for different ages and abilities as well  -[facility] (a variety of indoor and outdoor inperson activities for adults). 805-550-2862. [address]  -Virtual Online Classes (a variety of topics): see seniorplanet.org or call ([phone])435-1290  -consider volunteering at a school, hospice center, church, senior center or elsewhere           Terressa Koyanagi, DO

## 2023-02-09 NOTE — Patient Instructions (Signed)
I really enjoyed getting to talk with you today! I am available on Tuesdays and Thursdays for virtual visits if you have any questions or concerns, or if I can be of any further assistance.   CHECKLIST FROM ANNUAL WELLNESS VISIT:  -Follow up (please call to schedule if not scheduled after visit):   -yearly for annual wellness visit with primary care office  Here is a list of your preventive care/health maintenance measures and the plan for each if any are due:  PLAN For any measures below that may be due:   Health Maintenance  Topic Date Due   COVID-19 Vaccine (6 - 2023-24 season) 02/25/2023 (Originally 04/22/2022)   INFLUENZA VACCINE  03/23/2023   FOOT EXAM  03/29/2023   HEMOGLOBIN A1C  04/27/2023   OPHTHALMOLOGY EXAM  08/10/2023   Medicare Annual Wellness (AWV)  02/09/2024   Pneumonia Vaccine 27+ Years old  Completed   Zoster Vaccines- Shingrix  Completed   HPV VACCINES  Aged Out   DTaP/Tdap/Td  Discontinued    -See a dentist at least yearly  -Get your eyes checked and then per your eye specialist's recommendations  -Other issues addressed today:   -I have included below further information regarding a healthy whole foods based diet, physical activity guidelines for adults, stress management and opportunities for social connections. I hope you find this information useful.   -----------------------------------------------------------------------------------------------------------------------------------------------------------------------------------------------------------------------------------------------------------  NUTRITION: -eat real food: lots of colorful vegetables (half the plate) and fruits -5-7 servings of vegetables and fruits per day (fresh or steamed is best), exp. 2 servings of vegetables with lunch and dinner and 2 servings of fruit per day. Berries and greens such as kale and collards are great choices.  -consume on a regular basis: whole grains (make sure  first ingredient on label contains the word "whole"), fresh fruits, fish, nuts, seeds, healthy oils (such as olive oil, avocado oil, grape seed oil) -may eat small amounts of dairy and lean meat on occasion, but avoid processed meats such as ham, bacon, lunch meat, etc. -drink water -try to avoid fast food and pre-packaged foods, processed meat -most experts advise limiting sodium to < 2300mg  per day, should limit further is any chronic conditions such as high blood pressure, heart disease, diabetes, etc. The American Heart Association advised that < 1500mg  is is ideal -try to avoid foods that contain any ingredients with names you do not recognize  -try to avoid sugar/sweets (except for the natural sugar that occurs in fresh fruit) -try to avoid sweet drinks -try to avoid white rice, white bread, pasta (unless whole grain), white or yellow potatoes  EXERCISE GUIDELINES FOR ADULTS: -if you wish to increase your physical activity, do so gradually and with the approval of your doctor -STOP and seek medical care immediately if you have any chest pain, chest discomfort or trouble breathing when starting or increasing exercise  -move and stretch your body, legs, feet and arms when sitting for long periods -Physical activity guidelines for optimal health in adults: -least 150 minutes per week of aerobic exercise (can talk, but not sing) once approved by your doctor, 20-30 minutes of sustained activity or two 10 minute episodes of sustained activity every day.  -resistance training at least 2 days per week if approved by your doctor -balance exercises 3+ days per week:   Stand somewhere where you have something sturdy to hold onto if you lose balance.    1) lift up on toes, start with 5x per day and work up to  20x   2) stand and lift on leg straight out to the side so that foot is a few inches of the floor, start with 5x each side and work up to 20x each side   3) stand on one foot, start with 5  seconds each side and work up to 20 seconds on each side  If you need ideas or help with getting more active:  -Silver sneakers https://tools.silversneakers.com  -Walk with a Doc: http://stephens-thompson.biz/  -try to include resistance (weight lifting/strength building) and balance exercises twice per week: or the following link for ideas: ChessContest.fr  UpdateClothing.com.cy  STRESS MANAGEMENT: -can try meditating, or just sitting quietly with deep breathing while intentionally relaxing all parts of your body for 5 minutes daily -if you need further help with stress, anxiety or depression please follow up with your primary doctor or contact the wonderful folks at [facility]: [place]: -options in [place] if you wish to engage in more social and exercise related activities:  -Silver sneakers https://tools.silversneakers.com  -Walk with a Doc: http://stephens-thompson.biz/  -Check out the [place] 50+ section on the [place] of [employer] (hiking clubs, book clubs, cards and games, chess, exercise classes, aquatic classes and much more) - see the website for details: https://www.[place].gov/departments/parks-recreation/active-adults50  -YouTube has lots of exercise videos for different ages and abilities as well  -[place] (a variety of indoor and outdoor inperson activities for adults). [phone]. [address]  -Virtual Online Classes (a variety of topics): see seniorplanet.org or call [phone]  -consider volunteering at a school, hospice center, church, senior center or elsewhere

## 2023-02-10 DIAGNOSIS — Z992 Dependence on renal dialysis: Secondary | ICD-10-CM | POA: Diagnosis not present

## 2023-02-10 DIAGNOSIS — N186 End stage renal disease: Secondary | ICD-10-CM | POA: Diagnosis not present

## 2023-02-13 ENCOUNTER — Telehealth: Payer: Self-pay | Admitting: *Deleted

## 2023-02-13 DIAGNOSIS — Z992 Dependence on renal dialysis: Secondary | ICD-10-CM | POA: Diagnosis not present

## 2023-02-13 DIAGNOSIS — N186 End stage renal disease: Secondary | ICD-10-CM | POA: Diagnosis not present

## 2023-02-13 NOTE — Telephone Encounter (Signed)
Telephone encounter was:  Successful.  02/13/2023 Name: Austin [name] MRN: [id number] DOB: 1935-06-02  Austin [name] is a 88 y.o. year old male who is a primary care patient of [name], NP . The community resource team was consulted for assistance with Transportation Needs   Care guide performed the following interventions: Patient provided with information about care guide support team and interviewed to confirm resource needs. arranged transportation to opthamalogist  Follow Up Plan:  No further follow up planned at this time. The patient has been provided with needed resources.  Austin [name] -[facility] [place], [facility] [phone] [address] [place] , [place] [address] Email : [name] DOB: 08/02/1935 MRN: [id number]   RIDER WAIVER AND RELEASE OF LIABILITY  For purposes of improving physical access to our facilities, [facility] is pleased to partner with third parties to provide [place] patients or other authorized individuals the option of convenient, on-demand ground transportation services (the [employer]") through use of the technology service that enables users to request on-demand ground transportation from independent third-party providers.  By opting to use and accept these [employer], I, the undersigned, hereby agree on behalf of myself, and on behalf of any minor child using the Science writer for whom I am the parent or legal guardian, as follows:  Science writer provided to me are provided by independent third-party transportation providers who are not [employer] or employees and who are unaffiliated with [employer]. [place] is neither a transportation carrier nor a common or public carrier. [place] has no control over the quality or safety of the transportation that occurs as a result of the [employer]. [place] cannot guarantee that any  third-party transportation provider will complete any arranged transportation service. [place] makes no representation, warranty, or guarantee regarding the reliability, timeliness, quality, safety, suitability, or availability of any of the Transport Services or that they will be error free. I fully understand that traveling by vehicle involves risks and dangers of serious bodily injury, including permanent disability, paralysis, and death. I agree, on behalf of myself and on behalf of any minor child using the Transport Services for whom I am the parent or legal guardian, that the entire risk arising out of my use of the [employer] remains solely with me, to the maximum extent permitted under applicable law. The [employer] are provided "as is" and "as available." [place] disclaims all representations and warranties, express, implied or statutory, not expressly set out in these terms, including the implied warranties of merchantability and fitness for a particular purpose. I hereby waive and release [place], its agents, employees, officers, directors, representatives, insurers, attorneys, assigns, successors, subsidiaries, and affiliates from any and all past, present, or future claims, demands, liabilities, actions, causes of action, or suits of any kind directly or indirectly arising from acceptance and use of the [employer]. I further waive and release [place] and its affiliates from all present and future liability and responsibility for any injury or death to persons or damages to property caused by or related to the use of the [employer]. I have read this Waiver and Release of Liability, and I understand the terms used in it and their legal significance. This Waiver is freely and voluntarily given with the understanding that my right (as well as the right of any minor child for whom I am the parent or legal guardian using  the [employer]) to legal  recourse against [place] in connection with the [employer] is knowingly surrendered in return for use of these services.   I attest that I read the consent document to Lyn[place] Citizen, gave Mr. Maniscalco the opportunity to ask questions and answered the questions asked (if any). I affirm that Lynford Citizen then provided consent for he's participation in this program.     Dione Booze

## 2023-02-14 DIAGNOSIS — H25043 Posterior subcapsular polar age-related cataract, bilateral: Secondary | ICD-10-CM | POA: Diagnosis not present

## 2023-02-14 DIAGNOSIS — H5203 Hypermetropia, bilateral: Secondary | ICD-10-CM | POA: Diagnosis not present

## 2023-02-14 DIAGNOSIS — H524 Presbyopia: Secondary | ICD-10-CM | POA: Diagnosis not present

## 2023-02-14 DIAGNOSIS — H2513 Age-related nuclear cataract, bilateral: Secondary | ICD-10-CM | POA: Diagnosis not present

## 2023-02-14 DIAGNOSIS — E119 Type 2 diabetes mellitus without complications: Secondary | ICD-10-CM | POA: Diagnosis not present

## 2023-02-14 DIAGNOSIS — H25013 Cortical age-related cataract, bilateral: Secondary | ICD-10-CM | POA: Diagnosis not present

## 2023-02-15 DIAGNOSIS — N186 End stage renal disease: Secondary | ICD-10-CM | POA: Diagnosis not present

## 2023-02-15 DIAGNOSIS — Z992 Dependence on renal dialysis: Secondary | ICD-10-CM | POA: Diagnosis not present

## 2023-02-16 ENCOUNTER — Ambulatory Visit: Payer: Medicare PPO | Admitting: Neurology

## 2023-02-16 DIAGNOSIS — R202 Paresthesia of skin: Secondary | ICD-10-CM | POA: Diagnosis not present

## 2023-02-16 DIAGNOSIS — E1142 Type 2 diabetes mellitus with diabetic polyneuropathy: Secondary | ICD-10-CM

## 2023-02-16 NOTE — Procedures (Signed)
[facility] Neurology  [address] [place], [address]  [place], [place] [address] Tel: [phone] Fax: [phone] Test Date:  02/16/2023  Patient: Austin [name] DOB: 10-05-1934 Physician: Nita Sickle, DO  Sex: Male Height: 5\' 7"  Ref Phys: Gershon Crane, MD  ID#: [id number]   Technician:    History: This is a 88 year old man referred for evaluation of bilateral hand paresthesias.  NCV & EMG Findings: Extensive electrodiagnostic testing of the right upper extremity and additional studies of the left shows:  Right median sensory response is absent.  Left median and ulnar sensory responses show prolonged latency (L4.6, L3.7 ms) and reduced amplitude (L7.3, L4.2 V).  Right ulnar sensory response shows prolonged latency (R3.4 ms).  Bilateral radial sensory responses are within normal limits. Right median motor response shows prolonged latency (6.3 ms), reduced amplitude (2.9 mV), and decreased conduction velocity (Elbow-Wrist, 38 m/s).  Left median motor response shows prolonged latency (4.4 ms) and decreased conduction velocity (Elbow-Wrist, 42 m/s).  Right ulnar motor response shows slowed conduction velocity along the course of the nerve (29 - 39 m/s).  Left ulnar motor response shows prolonged latency (4.1 ms), reduced amplitude (5.4 mV), decreased conduction velocity (B Elbow-Wrist, 40 m/s), and decreased conduction velocity (A Elbow-B Elbow, 34 m/s).   Chronic motor axon loss changes are seen affecting the distal hand muscles bilaterally, which is worse in the right abductor pollicis brevis muscle.  There is no evidence of accompanying active denervation affecting any of the tested muscles.   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  Overall, these findings are moderate-to-severe.  Probable overlapping right median neuropathy at the wrist (carpal tunnel  syndrome).  Consider nerve ultrasound to further evaluate.    ___________________________ Nita Sickle, DO    Nerve Conduction Studies   Stim Site NR Peak (ms) Norm Peak (ms) O-P Amp (V) Norm O-P Amp  Left Median Anti Sensory (2nd Digit)  32 C  Wrist    *4.6 <3.8 *7.3 >10  Right Median Anti Sensory (2nd Digit)  32 C  Wrist *NR  <3.8  >10  Left Radial Anti Sensory (Base 1st Digit)  32 C  Wrist    2.8 <2.8 13.8 >10  Right Radial Anti Sensory (Base 1st Digit)  32 C  Wrist    2.3 <2.8 11.6 >10  Left Ulnar Anti Sensory (5th Digit)  32 C  Wrist    *3.7 <3.2 *4.2 >5  Right Ulnar Anti Sensory (5th Digit)  32 C  Wrist    *3.4 <3.2 6.0 >5     Stim Site NR Onset (ms) Norm Onset (ms) O-P Amp (mV) Norm O-P Amp Site1 Site2 Delta-0 (ms) Dist (cm) Vel (m/s) Norm Vel (m/s)  Left Median Motor (Abd Poll Brev)  32 C  Wrist    *4.4 <4.0 6.3 >5 Elbow Wrist 6.9 29.0 *42 >50  Elbow    11.3  5.4         Right Median Motor (Abd Poll Brev)  32 C  Wrist    *6.3 <4.0 *2.9 >5 Elbow Wrist 7.1 27.0 *38 >50  Elbow    13.4  2.3         Left Ulnar Motor (Abd Dig Minimi)  32 C  Wrist    *4.1 <3.1 *5.4 >7 B Elbow Wrist 5.7 23.0 *40 >50  B Elbow    9.8  3.6  A Elbow B Elbow 2.9 10.0 *34 >  50  A Elbow    12.7  3.4         Right Ulnar Motor (Abd Dig Minimi)  32 C  Wrist    2.8 <3.1 8.1 >7 B Elbow Wrist 5.6 22.0 *39 >50  B Elbow    8.4  7.2  A Elbow B Elbow 3.5 10.0 *29 >50  A Elbow    11.9  5.9          Electromyography   Side Muscle Ins.Act Fibs Fasc Recrt Amp Dur Poly Activation Comment  Right 1stDorInt Nml Nml Nml *2- *1+ *1+ *1+ Nml N/A  Right Abd Poll Brev Nml Nml Nml *3- *1+ *1+ *1+ Nml N/A  Right PronatorTeres Nml Nml Nml Nml Nml Nml Nml Nml N/A  Right Biceps Nml Nml Nml Nml Nml Nml Nml Nml N/A  Right Triceps Nml Nml Nml Nml Nml Nml Nml Nml N/A  Right Deltoid Nml Nml Nml Nml Nml Nml Nml Nml N/A  Right Ext Indicis Nml Nml Nml *1- *1+ *1+ *1+ Nml N/A  Right Abd Dig Min Nml Nml Nml *1- *1+  *1+ *1+ Nml N/A  Left 1stDorInt Nml Nml Nml *1- *1+ *1+ *1+ Nml N/A  Left Ext Indicis Nml Nml Nml *1- *1+ *1+ *1+ Nml N/A  Left Abd Poll Brev Nml Nml Nml *1- *1+ *1+ *1+ Nml N/A  Left Abd Dig Min Nml Nml Nml *1- *1+ *1+ *1+ Nml N/A      Waveforms:

## 2023-02-17 DIAGNOSIS — N186 End stage renal disease: Secondary | ICD-10-CM | POA: Diagnosis not present

## 2023-02-17 DIAGNOSIS — Z992 Dependence on renal dialysis: Secondary | ICD-10-CM | POA: Diagnosis not present

## 2023-02-17 NOTE — Addendum Note (Signed)
Addended by: Gershon Crane A on: 02/17/2023 01:03 PM   Modules accepted: Orders

## 2023-02-19 DIAGNOSIS — N186 End stage renal disease: Secondary | ICD-10-CM | POA: Diagnosis not present

## 2023-02-19 DIAGNOSIS — E1022 Type 1 diabetes mellitus with diabetic chronic kidney disease: Secondary | ICD-10-CM | POA: Diagnosis not present

## 2023-02-19 DIAGNOSIS — Z992 Dependence on renal dialysis: Secondary | ICD-10-CM | POA: Diagnosis not present

## 2023-02-20 ENCOUNTER — Other Ambulatory Visit: Payer: Medicare PPO

## 2023-02-20 DIAGNOSIS — N186 End stage renal disease: Secondary | ICD-10-CM | POA: Diagnosis not present

## 2023-02-20 DIAGNOSIS — Z992 Dependence on renal dialysis: Secondary | ICD-10-CM | POA: Diagnosis not present

## 2023-02-22 DIAGNOSIS — Z992 Dependence on renal dialysis: Secondary | ICD-10-CM | POA: Diagnosis not present

## 2023-02-22 DIAGNOSIS — N186 End stage renal disease: Secondary | ICD-10-CM | POA: Diagnosis not present

## 2023-02-24 DIAGNOSIS — N186 End stage renal disease: Secondary | ICD-10-CM | POA: Diagnosis not present

## 2023-02-24 DIAGNOSIS — Z992 Dependence on renal dialysis: Secondary | ICD-10-CM | POA: Diagnosis not present

## 2023-02-27 DIAGNOSIS — N186 End stage renal disease: Secondary | ICD-10-CM | POA: Diagnosis not present

## 2023-02-27 DIAGNOSIS — Z992 Dependence on renal dialysis: Secondary | ICD-10-CM | POA: Diagnosis not present

## 2023-03-01 DIAGNOSIS — N186 End stage renal disease: Secondary | ICD-10-CM | POA: Diagnosis not present

## 2023-03-01 DIAGNOSIS — R531 Weakness: Secondary | ICD-10-CM | POA: Diagnosis not present

## 2023-03-01 DIAGNOSIS — Z992 Dependence on renal dialysis: Secondary | ICD-10-CM | POA: Diagnosis not present

## 2023-03-01 DIAGNOSIS — I509 Heart failure, unspecified: Secondary | ICD-10-CM | POA: Diagnosis not present

## 2023-03-01 DIAGNOSIS — I5033 Acute on chronic diastolic (congestive) heart failure: Secondary | ICD-10-CM | POA: Diagnosis not present

## 2023-03-01 DIAGNOSIS — J9601 Acute respiratory failure with hypoxia: Secondary | ICD-10-CM | POA: Diagnosis not present

## 2023-03-02 ENCOUNTER — Other Ambulatory Visit: Payer: Medicare PPO

## 2023-03-02 ENCOUNTER — Telehealth: Payer: Self-pay | Admitting: Adult Health

## 2023-03-02 DIAGNOSIS — H25012 Cortical age-related cataract, left eye: Secondary | ICD-10-CM | POA: Diagnosis not present

## 2023-03-02 DIAGNOSIS — G5601 Carpal tunnel syndrome, right upper limb: Secondary | ICD-10-CM

## 2023-03-02 DIAGNOSIS — H25042 Posterior subcapsular polar age-related cataract, left eye: Secondary | ICD-10-CM | POA: Diagnosis not present

## 2023-03-02 DIAGNOSIS — H2512 Age-related nuclear cataract, left eye: Secondary | ICD-10-CM | POA: Diagnosis not present

## 2023-03-02 DIAGNOSIS — H25812 Combined forms of age-related cataract, left eye: Secondary | ICD-10-CM | POA: Diagnosis not present

## 2023-03-02 NOTE — Telephone Encounter (Signed)
Pt called regarding Neurology results from 02/16/2023. Pt is disappointed that he has not been called and has not been sleeping.

## 2023-03-02 NOTE — Telephone Encounter (Signed)
Please advise 

## 2023-03-02 NOTE — Telephone Encounter (Signed)
Patient notified of update  and verbalized understanding. 

## 2023-03-03 DIAGNOSIS — N186 End stage renal disease: Secondary | ICD-10-CM | POA: Diagnosis not present

## 2023-03-03 DIAGNOSIS — Z992 Dependence on renal dialysis: Secondary | ICD-10-CM | POA: Diagnosis not present

## 2023-03-06 DIAGNOSIS — Z992 Dependence on renal dialysis: Secondary | ICD-10-CM | POA: Diagnosis not present

## 2023-03-06 DIAGNOSIS — N186 End stage renal disease: Secondary | ICD-10-CM | POA: Diagnosis not present

## 2023-03-07 ENCOUNTER — Other Ambulatory Visit: Payer: Self-pay | Admitting: Adult Health

## 2023-03-07 DIAGNOSIS — I350 Nonrheumatic aortic (valve) stenosis: Secondary | ICD-10-CM | POA: Diagnosis not present

## 2023-03-07 DIAGNOSIS — I10 Essential (primary) hypertension: Secondary | ICD-10-CM | POA: Diagnosis not present

## 2023-03-07 DIAGNOSIS — I5032 Chronic diastolic (congestive) heart failure: Secondary | ICD-10-CM | POA: Diagnosis not present

## 2023-03-07 DIAGNOSIS — I351 Nonrheumatic aortic (valve) insufficiency: Secondary | ICD-10-CM | POA: Diagnosis not present

## 2023-03-07 MED ORDER — PREGABALIN 25 MG PO CAPS: 25.0000 mg | ORAL_CAPSULE | Freq: Every day | ORAL | 0 refills | Status: DC

## 2023-03-07 NOTE — Telephone Encounter (Signed)
Spoke to pt and advise that I spoke with him about the issue and advised to f/u with neurology. Pt claimed that the neurologist stated she does not go over the readings but will send to treating physician and PCP to result. Pt stated that he is in pain and someone needs to go over it with him so he can have some answers and possible medication. Pt stated that he has lost a lot of sleep from this.

## 2023-03-07 NOTE — Telephone Encounter (Signed)
Pt states he did not speak to CMA on 03/02/23.  Pt stated he was having surgery on that day. Pt says CMA may have spoken to his wife. Wife says she did not understand what CMA said. Pt wants a call back to elaborate. Pt added he is in a lot of Carpal Tunnel pain.   Please return call asap.

## 2023-03-08 DIAGNOSIS — Z992 Dependence on renal dialysis: Secondary | ICD-10-CM | POA: Diagnosis not present

## 2023-03-08 DIAGNOSIS — N186 End stage renal disease: Secondary | ICD-10-CM | POA: Diagnosis not present

## 2023-03-08 NOTE — Telephone Encounter (Signed)
 Patient notified of update  and verbalized understanding. 

## 2023-03-08 NOTE — Telephone Encounter (Signed)
Referral sent and pt will pick up the medication. Pt advised to call back if he does not hear from sports med by Friday.

## 2023-03-09 ENCOUNTER — Ambulatory Visit: Payer: Medicare PPO | Admitting: Emergency Medicine

## 2023-03-09 ENCOUNTER — Telehealth: Payer: Self-pay | Admitting: *Deleted

## 2023-03-09 DIAGNOSIS — R262 Difficulty in walking, not elsewhere classified: Secondary | ICD-10-CM | POA: Diagnosis not present

## 2023-03-09 DIAGNOSIS — E43 Unspecified severe protein-calorie malnutrition: Secondary | ICD-10-CM | POA: Diagnosis not present

## 2023-03-09 NOTE — Telephone Encounter (Signed)
   Austin [name] DOB: 04-27-1935 MRN: [id number]   RIDER WAIVER AND RELEASE OF LIABILITY  For purposes of improving physical access to our facilities, [facility] is pleased to partner with third parties to provide [facility] patients or other authorized individuals the option of convenient, on-demand ground transportation services (the [place]") through use of the technology service that enables users to request on-demand ground transportation from independent third-party providers.  By opting to use and accept these [employer], I, the undersigned, hereby agree on behalf of myself, and on behalf of any minor child using the Government social research officer for whom I am the parent or legal guardian, as follows:  Government social research officer provided to me are provided by independent third-party transportation providers who are not [employer] or employees and who are unaffiliated with [employer]. [place] is neither a transportation carrier nor a common or public carrier. [place] has no control over the quality or safety of the transportation that occurs as a result of the [employer]. [place] cannot guarantee that any third-party transportation provider will complete any arranged transportation service. [place] makes no representation, warranty, or guarantee regarding the reliability, timeliness, quality, safety, suitability, or availability of any of the Transport Services or that they will be error free. I fully understand that traveling by vehicle involves risks and dangers of serious bodily injury, including permanent disability, paralysis, and death. I agree, on behalf of myself and on behalf of any minor child using the Transport Services for whom I am the parent or legal guardian, that the entire risk arising out of my use of the [employer] remains solely with me, to the maximum extent permitted under applicable law. The [employer] are provided "as is"  and "as available." [place] disclaims all representations and warranties, express, implied or statutory, not expressly set out in these terms, including the implied warranties of merchantability and fitness for a particular purpose. I hereby waive and release [place], its agents, employees, officers, directors, representatives, insurers, attorneys, assigns, successors, subsidiaries, and affiliates from any and all past, present, or future claims, demands, liabilities, actions, causes of action, or suits of any kind directly or indirectly arising from acceptance and use of the [employer]. I further waive and release [place] and its affiliates from all present and future liability and responsibility for any injury or death to persons or damages to property caused by or related to the use of the [employer]. I have read this Waiver and Release of Liability, and I understand the terms used in it and their legal significance. This Waiver is freely and voluntarily given with the understanding that my right (as well as the right of any minor child for whom I am the parent or legal guardian using the [employer]) to legal recourse against [place] in connection with the [employer] is knowingly surrendered in return for use of these services.   I attest that I read the consent document to Austin [name], gave Mr. Weigelt the opportunity to ask questions and answered the questions asked (if any). I affirm that Austin [name] then provided consent for he's participation in this program.     Austin [name]

## 2023-03-10 DIAGNOSIS — Z992 Dependence on renal dialysis: Secondary | ICD-10-CM | POA: Diagnosis not present

## 2023-03-10 DIAGNOSIS — N186 End stage renal disease: Secondary | ICD-10-CM | POA: Diagnosis not present

## 2023-03-13 DIAGNOSIS — N186 End stage renal disease: Secondary | ICD-10-CM | POA: Diagnosis not present

## 2023-03-13 DIAGNOSIS — Z992 Dependence on renal dialysis: Secondary | ICD-10-CM | POA: Diagnosis not present

## 2023-03-15 DIAGNOSIS — N186 End stage renal disease: Secondary | ICD-10-CM | POA: Diagnosis not present

## 2023-03-15 DIAGNOSIS — Z992 Dependence on renal dialysis: Secondary | ICD-10-CM | POA: Diagnosis not present

## 2023-03-15 NOTE — Progress Notes (Unsigned)
[name] [address] [place] [address] Phone: [phone] Subjective:   INadine Counts, am serving as a scribe for Dr. Antoine Primas.  I'm seeing this patient by the request  of:  [name], NP  CC: Right wrist pain follow-up  [id number]  Austin [name] is a 88 y.o. male coming in with complaint of R carpal tunnel. Patient states pain for about a month. Stays about the same. Has flares. Voltaren helped in the beginning. Numbness and tingling in hand. Talk about pregabalin, was prescribed, but not taking because of side effect warnings.     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 BLOOD LOSS 03/13/2007   CAROTID ARTERY STENOSIS 05/10/2010   CHF (congestive heart failure) (HCC)    DIABETES MELLITUS, TYPE II 09/19/2007   DISEASE, CEREBROVASCULAR NEC 03/05/2007   GERD 03/13/2007   HYPERLIPIDEMIA 03/05/2007   HYPERTENSION 03/05/2007   HYPOKALEMIA 11/09/2009   KNEE PAIN, RIGHT 11/09/2009   PROSTATE CANCER, HX OF 03/05/2007   RENAL DISEASE, CHRONIC 02/03/2009   Past Surgical History:  Procedure Laterality Date   AV FISTULA PLACEMENT Left 10/18/2022   Procedure: INSERTION OF LEFT ARM BRACHIAL ARTERY TO AXILLARY VEIN ARTERIOVENOUS (AV) GORE-TEX GRAFT;  Surgeon: Victorino Sparrow, MD;  Location: [facility] OR;  Service: Vascular;  Laterality: Left;   CAROTID ARTERY ANGIOPLASTY Right Oct. 10, 2001   ESOPHAGOGASTRODUODENOSCOPY (EGD) WITH PROPOFOL N/A 11/04/2016   Procedure: ESOPHAGOGASTRODUODENOSCOPY (EGD) WITH PROPOFOL;  Surgeon: Kathi Der, MD;  Location: [facility];  Service: Gastroenterology;  Laterality: N/A;   IR FLUORO GUIDE CV LINE RIGHT  10/25/2022   IR FLUORO GUIDE CV LINE RIGHT  10/27/2022   IR US GUIDE VASC ACCESS RIGHT  10/25/2022   IR US GUIDE VASC ACCESS RIGHT  10/27/2022    LAPAROSCOPIC APPENDECTOMY  02/20/2012   Procedure: APPENDECTOMY LAPAROSCOPIC;  Surgeon: Almond Lint, MD;  Location: [facility] OR;  Service: General;  Laterality: N/A;   PROSTATE SURGERY     prostatectomy   Social History   Socioeconomic History   Marital status: Married    Spouse name: Not on file   Number of children: Not on file   Years of education: Not on file   Highest education level: Not on file  Occupational History   Not on file  Tobacco Use   Smoking status: Former    Current packs/day: 0.00    Average packs/day: 1 pack/day for 10.0 years (10.0 ttl pk-yrs)    Types: Cigarettes    Start date: 08/22/1964    Quit date: 08/22/1974    Years since quitting: 48.5   Smokeless tobacco: Never  Vaping Use   Vaping status: Never Used  Substance and Sexual Activity   Alcohol use: No    Alcohol/week: 0.0 standard drinks of alcohol   Drug use: No   Sexual activity: Not Currently  Other Topics Concern   Not on file  Social History Narrative   Retired - Administrator, arts    Married 53 years       He enjoys traveling    Social Determinants of Corporate investment banker Strain: Low Risk  (02/09/2023)   Overall Financial Resource Strain (CARDIA)    Difficulty of Paying Living Expenses: Not very hard  Food Insecurity: No Food Insecurity (02/09/2023)   Hunger Vital Sign  Worried About Programme researcher, broadcasting/film/video in the Last Year: Never true    Ran Out of Food in the Last Year: Never true  Transportation Needs: No Transportation Needs (07/27/2022)   PRAPARE - Administrator, Civil Service (Medical): No    Lack of Transportation (Non-Medical): No  Physical Activity: Insufficiently Active (02/09/2023)   Exercise Vital Sign    Days of Exercise per Week: 3 days    Minutes of Exercise per Session: 30 min  Stress: No Stress Concern Present (02/09/2023)   [employer] of Occupational Health - Occupational Stress Questionnaire    Feeling of Stress : Not at all  Social Connections:  Moderately Integrated (02/09/2023)   Social Connection and Isolation Panel [NHANES]    Frequency of Communication with Friends and Family: Twice a week    Frequency of Social Gatherings with Friends and Family: Never    Attends Religious Services: More than 4 times per year    Active Member of [employer] or Organizations: Yes    Attends Engineer, structural: More than 4 times per year    Marital Status: Married   Allergies  Allergen Reactions   Aspirin Other (See Comments)    High doses causes stomach ulcer and bleeding   Family History  Problem Relation Age of Onset   Hypertension Mother    Cancer Father        Mesothelioma    Stomach cancer Brother    Cancer Brother    Cancer - Cervical Brother    Cancer Brother    Diabetes Brother    Esophageal cancer Neg Hx    Colon cancer Neg Hx    Pancreatic cancer Neg Hx       Current Outpatient Medications (Cardiovascular):    amLODipine (NORVASC) 10 MG tablet, Take 1 tablet (10 mg total) by mouth daily.   atorvastatin (LIPITOR) 20 MG tablet, Take 1 tablet (20 mg total) by mouth daily.   cloNIDine (CATAPRES) 0.3 MG tablet, Take 1 tablet (0.3 mg total) by mouth 3 (three) times daily. (Patient taking differently: Take 0.3 mg by mouth 2 (two) times daily.)   doxazosin (CARDURA) 2 MG tablet, Take 2 mg by mouth at bedtime.    isosorbide-hydrALAZINE (BIDIL) 20-37.5 MG tablet, Take 1 tablet by mouth 3 (three) times daily.   metolazone (ZAROXOLYN) 5 MG tablet, Take 5 mg by mouth daily as needed.   metoprolol succinate (TOPROL-XL) 100 MG 24 hr tablet, Take 1 tablet (100 mg total) by mouth daily. Take with or immediately following a meal.   torsemide (DEMADEX) 20 MG tablet, Take 40 mg by mouth 2 (two) times daily.   Current Outpatient Medications (Respiratory):    albuterol (VENTOLIN HFA) 108 (90 Base) MCG/ACT inhaler, TAKE 2 PUFFS BY MOUTH EVERY 6 HOURS AS NEEDED FOR WHEEZE OR SHORTNESS OF BREATH (Patient not taking: Reported on  02/09/2023)   guaiFENesin (MUCINEX) 600 MG 12 hr tablet, Take 600 mg by mouth at bedtime.   Current Outpatient Medications (Analgesics):    acetaminophen (TYLENOL) 500 MG tablet, Take 1,000 mg by mouth every 4 (four) hours as needed.   Current Outpatient Medications (Hematological):    clopidogrel (PLAVIX) 75 MG tablet, TAKE 1 TABLET BY MOUTH EVERY DAY (Patient taking differently: Take 75 mg by mouth daily.)   Current Outpatient Medications (Other):    ACCU-CHEK GUIDE test strip, USE TO CHECK BLOOD GLUCOSE TWICE A DAY OR AS NEEDED   Accu-Chek Softclix Lancets lancets, USED TO CHECK BLOOD GLUCOSE  TWICE A DAY OR AS NEEDED   cholecalciferol (VITAMIN D3) 25 MCG (1000 UNIT) tablet, Take 1,000 Units by mouth daily.   esomeprazole (NEXIUM) 40 MG capsule, TAKE 1 CAPSULE BY MOUTH EVERY DAY   hydrocortisone (ANUSOL-HC) 2.5 % rectal cream, Place 1 application rectally 2 (two) times daily.   KLOR-CON M10 10 MEQ tablet, Take by mouth.   lactulose (CHRONULAC) 10 GM/15ML solution, TAKE 30 MLS (20 G TOTAL) BY MOUTH 2 (TWO) TIMES DAILY AS NEEDED.   Melatonin 10 MG TABS, Take 10 mg by mouth at bedtime. (Patient not taking: Reported on 02/09/2023)   methylcellulose (CITRUCEL) oral powder, 1 tablespoon every 2-3 days,alternating with lactulose   neomycin-polymyxin b-dexamethasone (MAXITROL) 3.5-10000-0.1 SUSP, Place 1 drop into both eyes 4 (four) times daily.   pregabalin (LYRICA) 25 MG capsule, Take 1 capsule (25 mg total) by mouth daily.   Propylene Glycol (SYSTANE BALANCE OP), Place 1 drop into both eyes at bedtime.   sevelamer carbonate (RENVELA) 800 MG tablet, Take 800 mg by mouth 3 (three) times daily. (Patient not taking: Reported on 02/09/2023)   traZODone (DESYREL) 50 MG tablet, Take 1 tablet (50 mg total) by mouth at bedtime.   triamcinolone cream (KENALOG) 0.1 %, Apply 1 application topically daily as needed (dry/irritated skin).  Current Facility-Administered Medications (Other):    ammonium  lactate (LAC-HYDRIN) 12 % lotion   Reviewed prior external information including notes and imaging from  primary care provider As well as notes that were available from care everywhere and other healthcare systems.  Past medical history, social, surgical and family history all reviewed in electronic medical record.  No pertanent information unless stated regarding to the chief complaint.   Review of Systems:  No headache, visual changes, nausea, vomiting, diarrhea, constipation, dizziness, abdominal pain, skin rash, fevers, chills, night sweats, weight loss, swollen lymph nodes, body aches, joint swelling, chest pain, shortness of breath, mood changes. POSITIVE muscle aches  Objective  Blood pressure (!) 106/59, pulse 87, height 5\' 7"  (1.702 m), weight 168 lb (76.2 kg), SpO2 94%.   General: No apparent distress alert and oriented x3 mood and affect normal, dressed appropriately.  HEENT: Pupils equal, extraocular movements intact  Respiratory: Patient's speak in full sentences and does not appear short of breath  Cardiovascular: No lower extremity edema, non tender, no erythema  Antalgic gait noted. Wrist though does have a positive Tinel's noted.  Does have some thenar eminence wasting noted.  Some findings signs and symptoms are also consistent with some peripheral neuropathy.  Procedure: Real-time Ultrasound Guided Injection of right carpal tunnel Device: GE Logiq Q7  Ultrasound guided injection is preferred based studies that show increased duration, increased effect, greater accuracy, decreased procedural pain, increased response rate with ultrasound guided versus blind injection.  Verbal informed consent obtained.  Time-out conducted.  Noted no overlying erythema, induration, or other signs of local infection.  Skin prepped in a sterile fashion.  Local anesthesia: Topical Ethyl chloride.  With sterile technique and under real time ultrasound guidance:  median nerve visualized.  23g  5/8 inch needle inserted distal to proximal approach into nerve sheath. Pictures taken nfor needle placement. Patient did have injection of 0.5cc of 0.5% Marcaine, and 0.5 cc of Kenalog 40 mg/dL. Completed without difficulty  Pain immediately resolved suggesting accurate placement of the medication.  Advised to call if fevers/chills, erythema, induration, drainage, or persistent bleeding.  Impression: Technically successful ultrasound guided injection.    Impression and Recommendations:     The above  documentation has been reviewed and is accurate and complete Judi Saa, DO

## 2023-03-16 ENCOUNTER — Other Ambulatory Visit: Payer: Self-pay

## 2023-03-16 ENCOUNTER — Encounter: Payer: Self-pay | Admitting: Family Medicine

## 2023-03-16 ENCOUNTER — Ambulatory Visit: Payer: Medicare PPO | Admitting: Family Medicine

## 2023-03-16 VITALS — BP 106/59 | HR 87 | Ht 67.0 in | Wt 168.0 lb

## 2023-03-16 DIAGNOSIS — G5601 Carpal tunnel syndrome, right upper limb: Secondary | ICD-10-CM

## 2023-03-16 DIAGNOSIS — M25531 Pain in right wrist: Secondary | ICD-10-CM

## 2023-03-16 NOTE — Assessment & Plan Note (Signed)
Patient has many different comorbidities which does make treatment difficult.  Patient would not want to have any surgical intervention even though the patient's nerve conduction study does show moderate to severe arthritic changes.  Discussed with patient at great length about icing regimen and home exercises.  Discussed bracing at night.  Discussed can repeat the injection every 3 to 4 months if needed.  Follow-up with me again in 2 to 3 months.

## 2023-03-16 NOTE — Patient Instructions (Addendum)
Injection in wrist today Brace day and night for 2 weeks at night 2 additional weeks Do prescribed exercises at least 3x a week  See you again in 6-8 weeks

## 2023-03-17 DIAGNOSIS — Z992 Dependence on renal dialysis: Secondary | ICD-10-CM | POA: Diagnosis not present

## 2023-03-17 DIAGNOSIS — N186 End stage renal disease: Secondary | ICD-10-CM | POA: Diagnosis not present

## 2023-03-20 DIAGNOSIS — Z992 Dependence on renal dialysis: Secondary | ICD-10-CM | POA: Diagnosis not present

## 2023-03-20 DIAGNOSIS — N186 End stage renal disease: Secondary | ICD-10-CM | POA: Diagnosis not present

## 2023-03-22 DIAGNOSIS — Z992 Dependence on renal dialysis: Secondary | ICD-10-CM | POA: Diagnosis not present

## 2023-03-22 DIAGNOSIS — E1022 Type 1 diabetes mellitus with diabetic chronic kidney disease: Secondary | ICD-10-CM | POA: Diagnosis not present

## 2023-03-22 DIAGNOSIS — N186 End stage renal disease: Secondary | ICD-10-CM | POA: Diagnosis not present

## 2023-03-23 ENCOUNTER — Ambulatory Visit: Payer: Medicare PPO | Admitting: Emergency Medicine

## 2023-03-24 DIAGNOSIS — N186 End stage renal disease: Secondary | ICD-10-CM | POA: Diagnosis not present

## 2023-03-24 DIAGNOSIS — Z992 Dependence on renal dialysis: Secondary | ICD-10-CM | POA: Diagnosis not present

## 2023-03-27 DIAGNOSIS — Z992 Dependence on renal dialysis: Secondary | ICD-10-CM | POA: Diagnosis not present

## 2023-03-27 DIAGNOSIS — N186 End stage renal disease: Secondary | ICD-10-CM | POA: Diagnosis not present

## 2023-03-28 ENCOUNTER — Ambulatory Visit: Admission: RE | Admit: 2023-03-28 | Payer: Medicare PPO | Source: Ambulatory Visit

## 2023-03-28 DIAGNOSIS — R911 Solitary pulmonary nodule: Secondary | ICD-10-CM | POA: Diagnosis not present

## 2023-03-28 DIAGNOSIS — R918 Other nonspecific abnormal finding of lung field: Secondary | ICD-10-CM

## 2023-03-29 DIAGNOSIS — Z992 Dependence on renal dialysis: Secondary | ICD-10-CM | POA: Diagnosis not present

## 2023-03-29 DIAGNOSIS — N186 End stage renal disease: Secondary | ICD-10-CM | POA: Diagnosis not present

## 2023-03-30 ENCOUNTER — Telehealth: Payer: Self-pay | Admitting: Emergency Medicine

## 2023-03-30 NOTE — Telephone Encounter (Signed)
ATC X2- 1st time I called patient someone answered the phone and put me on hold. I wanted several minutes before the call ended. I have tried to call the patient back and receive a busy signal  Patient will need OV to be cleared for surgery. Please schedule with RB or NP

## 2023-03-30 NOTE — Telephone Encounter (Signed)
Austin [name] checking on fax for surgical clearance. Austin [name] phone number is [phone].

## 2023-03-31 DIAGNOSIS — N186 End stage renal disease: Secondary | ICD-10-CM | POA: Diagnosis not present

## 2023-03-31 DIAGNOSIS — Z992 Dependence on renal dialysis: Secondary | ICD-10-CM | POA: Diagnosis not present

## 2023-04-01 DIAGNOSIS — J9601 Acute respiratory failure with hypoxia: Secondary | ICD-10-CM | POA: Diagnosis not present

## 2023-04-01 DIAGNOSIS — R531 Weakness: Secondary | ICD-10-CM | POA: Diagnosis not present

## 2023-04-01 DIAGNOSIS — I5033 Acute on chronic diastolic (congestive) heart failure: Secondary | ICD-10-CM | POA: Diagnosis not present

## 2023-04-01 DIAGNOSIS — I509 Heart failure, unspecified: Secondary | ICD-10-CM | POA: Diagnosis not present

## 2023-04-03 DIAGNOSIS — Z992 Dependence on renal dialysis: Secondary | ICD-10-CM | POA: Diagnosis not present

## 2023-04-03 DIAGNOSIS — N186 End stage renal disease: Secondary | ICD-10-CM | POA: Diagnosis not present

## 2023-04-04 ENCOUNTER — Other Ambulatory Visit: Payer: Self-pay | Admitting: Adult Health

## 2023-04-05 DIAGNOSIS — N186 End stage renal disease: Secondary | ICD-10-CM | POA: Diagnosis not present

## 2023-04-05 DIAGNOSIS — Z992 Dependence on renal dialysis: Secondary | ICD-10-CM | POA: Diagnosis not present

## 2023-04-07 DIAGNOSIS — N186 End stage renal disease: Secondary | ICD-10-CM | POA: Diagnosis not present

## 2023-04-07 DIAGNOSIS — Z992 Dependence on renal dialysis: Secondary | ICD-10-CM | POA: Diagnosis not present

## 2023-04-07 NOTE — Telephone Encounter (Signed)
Patient has OV on 9/12. Surgical Clearance can be discussed then

## 2023-04-08 ENCOUNTER — Other Ambulatory Visit: Payer: Self-pay | Admitting: Adult Health

## 2023-04-09 DIAGNOSIS — E43 Unspecified severe protein-calorie malnutrition: Secondary | ICD-10-CM | POA: Diagnosis not present

## 2023-04-09 DIAGNOSIS — R262 Difficulty in walking, not elsewhere classified: Secondary | ICD-10-CM | POA: Diagnosis not present

## 2023-04-10 ENCOUNTER — Other Ambulatory Visit: Payer: Self-pay | Admitting: Adult Health

## 2023-04-10 DIAGNOSIS — I5033 Acute on chronic diastolic (congestive) heart failure: Secondary | ICD-10-CM

## 2023-04-10 DIAGNOSIS — N189 Chronic kidney disease, unspecified: Secondary | ICD-10-CM

## 2023-04-10 DIAGNOSIS — Z992 Dependence on renal dialysis: Secondary | ICD-10-CM

## 2023-04-10 DIAGNOSIS — N186 End stage renal disease: Secondary | ICD-10-CM | POA: Diagnosis not present

## 2023-04-10 DIAGNOSIS — I2510 Atherosclerotic heart disease of native coronary artery without angina pectoris: Secondary | ICD-10-CM

## 2023-04-12 DIAGNOSIS — N186 End stage renal disease: Secondary | ICD-10-CM | POA: Diagnosis not present

## 2023-04-12 DIAGNOSIS — Z992 Dependence on renal dialysis: Secondary | ICD-10-CM | POA: Diagnosis not present

## 2023-04-12 NOTE — Telephone Encounter (Signed)
 Patient need to schedule for more refills.

## 2023-04-14 DIAGNOSIS — Z992 Dependence on renal dialysis: Secondary | ICD-10-CM | POA: Diagnosis not present

## 2023-04-14 DIAGNOSIS — N186 End stage renal disease: Secondary | ICD-10-CM | POA: Diagnosis not present

## 2023-04-17 DIAGNOSIS — N186 End stage renal disease: Secondary | ICD-10-CM | POA: Diagnosis not present

## 2023-04-17 DIAGNOSIS — Z992 Dependence on renal dialysis: Secondary | ICD-10-CM | POA: Diagnosis not present

## 2023-04-19 DIAGNOSIS — N186 End stage renal disease: Secondary | ICD-10-CM | POA: Diagnosis not present

## 2023-04-19 DIAGNOSIS — Z992 Dependence on renal dialysis: Secondary | ICD-10-CM | POA: Diagnosis not present

## 2023-04-21 DIAGNOSIS — N186 End stage renal disease: Secondary | ICD-10-CM | POA: Diagnosis not present

## 2023-04-21 DIAGNOSIS — Z992 Dependence on renal dialysis: Secondary | ICD-10-CM | POA: Diagnosis not present

## 2023-04-22 DIAGNOSIS — N186 End stage renal disease: Secondary | ICD-10-CM | POA: Diagnosis not present

## 2023-04-22 DIAGNOSIS — E1022 Type 1 diabetes mellitus with diabetic chronic kidney disease: Secondary | ICD-10-CM | POA: Diagnosis not present

## 2023-04-22 DIAGNOSIS — Z992 Dependence on renal dialysis: Secondary | ICD-10-CM | POA: Diagnosis not present

## 2023-04-24 DIAGNOSIS — Z992 Dependence on renal dialysis: Secondary | ICD-10-CM | POA: Diagnosis not present

## 2023-04-24 DIAGNOSIS — N186 End stage renal disease: Secondary | ICD-10-CM | POA: Diagnosis not present

## 2023-04-26 ENCOUNTER — Other Ambulatory Visit: Payer: Self-pay | Admitting: Adult Health

## 2023-04-26 DIAGNOSIS — Z992 Dependence on renal dialysis: Secondary | ICD-10-CM | POA: Diagnosis not present

## 2023-04-26 DIAGNOSIS — N186 End stage renal disease: Secondary | ICD-10-CM | POA: Diagnosis not present

## 2023-04-28 ENCOUNTER — Ambulatory Visit: Payer: Medicare PPO | Admitting: Family Medicine

## 2023-04-28 DIAGNOSIS — Z992 Dependence on renal dialysis: Secondary | ICD-10-CM | POA: Diagnosis not present

## 2023-04-28 DIAGNOSIS — N186 End stage renal disease: Secondary | ICD-10-CM | POA: Diagnosis not present

## 2023-05-01 ENCOUNTER — Telehealth: Payer: Self-pay | Admitting: *Deleted

## 2023-05-01 DIAGNOSIS — N186 End stage renal disease: Secondary | ICD-10-CM | POA: Diagnosis not present

## 2023-05-01 DIAGNOSIS — Z992 Dependence on renal dialysis: Secondary | ICD-10-CM | POA: Diagnosis not present

## 2023-05-01 NOTE — Telephone Encounter (Signed)
   Telephone encounter was:  Successful.  05/01/2023 Name: Cristopher Bjornson MRN: [id number] DOB: 01/06/1935  Keylin Stettner is a 88 y.o. year old male who is a primary care patient of [name], NP . The community resource team was consulted for assistance with Transportation Needs   Care guide performed the following interventions: Patient provided with information about care guide support team and interviewed to confirm resource needs Patient called to schedule 2 rides  .  Follow Up Plan:  No further follow up planned at this time. The patient has been provided with needed resources.  Dione Booze [facility]  Population Health Careguide  Direct Dial: [phone] Website: [email or web address]

## 2023-05-02 ENCOUNTER — Ambulatory Visit: Payer: Medicare PPO | Admitting: Family Medicine

## 2023-05-02 ENCOUNTER — Other Ambulatory Visit: Payer: Self-pay

## 2023-05-02 ENCOUNTER — Encounter: Payer: Self-pay | Admitting: Family Medicine

## 2023-05-02 VITALS — BP 120/60 | HR 92 | Ht 67.0 in | Wt 166.0 lb

## 2023-05-02 DIAGNOSIS — R531 Weakness: Secondary | ICD-10-CM | POA: Diagnosis not present

## 2023-05-02 DIAGNOSIS — J9601 Acute respiratory failure with hypoxia: Secondary | ICD-10-CM | POA: Diagnosis not present

## 2023-05-02 DIAGNOSIS — G5601 Carpal tunnel syndrome, right upper limb: Secondary | ICD-10-CM

## 2023-05-02 DIAGNOSIS — I5033 Acute on chronic diastolic (congestive) heart failure: Secondary | ICD-10-CM | POA: Diagnosis not present

## 2023-05-02 DIAGNOSIS — M25531 Pain in right wrist: Secondary | ICD-10-CM | POA: Diagnosis not present

## 2023-05-02 DIAGNOSIS — I509 Heart failure, unspecified: Secondary | ICD-10-CM | POA: Diagnosis not present

## 2023-05-02 NOTE — Patient Instructions (Signed)
Injection in wrist today See you again in 3 months

## 2023-05-02 NOTE — Assessment & Plan Note (Signed)
Chronic problem that was better but still having some numbness noted.  Discussed icing regimen and home exercises, discussed which activities to do and he plans to increase activity slowly over the course of next several weeks.  Follow-up again in 6 to 8 weeks.

## 2023-05-02 NOTE — Progress Notes (Signed)
[name] [address] [place] [address] Phone: [phone] Subjective:   INadine Counts, am serving as a scribe for Dr. Antoine Primas.  I'm seeing this patient by the request  of:  [name], NP  CC: Right wrist pain follow-up  [id number]  Austin [name] is a 88 y.o. male coming in with complaint of R wrist pain. Ijnjection in wrist helped last time.  States that it is feeling much better overall.       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 BLOOD LOSS 03/13/2007   CAROTID ARTERY STENOSIS 05/10/2010   CHF (congestive heart failure) (HCC)    DIABETES MELLITUS, TYPE II 09/19/2007   DISEASE, CEREBROVASCULAR NEC 03/05/2007   GERD 03/13/2007   HYPERLIPIDEMIA 03/05/2007   HYPERTENSION 03/05/2007   HYPOKALEMIA 11/09/2009   KNEE PAIN, RIGHT 11/09/2009   PROSTATE CANCER, HX OF 03/05/2007   RENAL DISEASE, CHRONIC 02/03/2009   Past Surgical History:  Procedure Laterality Date   AV FISTULA PLACEMENT Left 10/18/2022   Procedure: INSERTION OF LEFT ARM BRACHIAL ARTERY TO AXILLARY VEIN ARTERIOVENOUS (AV) GORE-TEX GRAFT;  Surgeon: Victorino Sparrow, MD;  Location: [facility] OR;  Service: Vascular;  Laterality: Left;   CAROTID ARTERY ANGIOPLASTY Right Oct. 10, 2001   ESOPHAGOGASTRODUODENOSCOPY (EGD) WITH PROPOFOL N/A 11/04/2016   Procedure: ESOPHAGOGASTRODUODENOSCOPY (EGD) WITH PROPOFOL;  Surgeon: Kathi Der, MD;  Location: [facility];  Service: Gastroenterology;  Laterality: N/A;   IR FLUORO GUIDE CV LINE RIGHT  10/25/2022   IR FLUORO GUIDE CV LINE RIGHT  10/27/2022   IR US GUIDE VASC ACCESS RIGHT  10/25/2022   IR US GUIDE VASC ACCESS RIGHT  10/27/2022   LAPAROSCOPIC APPENDECTOMY  02/20/2012   Procedure: APPENDECTOMY LAPAROSCOPIC;  Surgeon: Almond Lint, MD;  Location: [facility] OR;  Service: General;   Laterality: N/A;   PROSTATE SURGERY     prostatectomy   Social History   Socioeconomic History   Marital status: Married    Spouse name: Not on file   Number of children: Not on file   Years of education: Not on file   Highest education level: Not on file  Occupational History   Not on file  Tobacco Use   Smoking status: Former    Current packs/day: 0.00    Average packs/day: 1 pack/day for 10.0 years (10.0 ttl pk-yrs)    Types: Cigarettes    Start date: 08/22/1964    Quit date: 08/22/1974    Years since quitting: 48.7   Smokeless tobacco: Never  Vaping Use   Vaping status: Never Used  Substance and Sexual Activity   Alcohol use: No    Alcohol/week: 0.0 standard drinks of alcohol   Drug use: No   Sexual activity: Not Currently  Other Topics Concern   Not on file  Social History Narrative   Retired - Administrator, arts    Married 53 years       He enjoys traveling    Social Determinants of Corporate investment banker Strain: Low Risk  (02/09/2023)   Overall Financial Resource Strain (CARDIA)    Difficulty of Paying Living Expenses: Not very hard  Food Insecurity: No Food Insecurity (02/09/2023)   Hunger Vital Sign    Worried About Running Out of Food in the Last Year: Never true    Ran Out of Food  in the Last Year: Never true  Transportation Needs: No Transportation Needs (07/27/2022)   PRAPARE - Administrator, Civil Service (Medical): No    Lack of Transportation (Non-Medical): No  Physical Activity: Insufficiently Active (02/09/2023)   Exercise Vital Sign    Days of Exercise per Week: 3 days    Minutes of Exercise per Session: 30 min  Stress: No Stress Concern Present (02/09/2023)   [employer] of Occupational Health - Occupational Stress Questionnaire    Feeling of Stress : Not at all  Social Connections: Moderately Integrated (02/09/2023)   Social Connection and Isolation Panel [NHANES]    Frequency of Communication with Friends and Family: Twice a  week    Frequency of Social Gatherings with Friends and Family: Never    Attends Religious Services: More than 4 times per year    Active Member of [employer] or Organizations: Yes    Attends Engineer, structural: More than 4 times per year    Marital Status: Married   Allergies  Allergen Reactions   Aspirin Other (See Comments)    High doses causes stomach ulcer and bleeding   Family History  Problem Relation Age of Onset   Hypertension Mother    Cancer Father        Mesothelioma    Stomach cancer Brother    Cancer Brother    Cancer - Cervical Brother    Cancer Brother    Diabetes Brother    Esophageal cancer Neg Hx    Colon cancer Neg Hx    Pancreatic cancer Neg Hx       Current Outpatient Medications (Cardiovascular):    amLODipine (NORVASC) 10 MG tablet, Take 1 tablet (10 mg total) by mouth daily.   atorvastatin (LIPITOR) 20 MG tablet, TAKE 1 TABLET BY MOUTH EVERY DAY   cloNIDine (CATAPRES) 0.3 MG tablet, Take 1 tablet (0.3 mg total) by mouth 3 (three) times daily. (Patient taking differently: Take 0.3 mg by mouth 2 (two) times daily.)   doxazosin (CARDURA) 2 MG tablet, Take 2 mg by mouth at bedtime.    isosorbide-hydrALAZINE (BIDIL) 20-37.5 MG tablet, Take 1 tablet by mouth 3 (three) times daily.   metolazone (ZAROXOLYN) 5 MG tablet, Take 5 mg by mouth daily as needed.   metoprolol succinate (TOPROL-XL) 100 MG 24 hr tablet, Take 1 tablet (100 mg total) by mouth daily. Take with or immediately following a meal.   torsemide (DEMADEX) 20 MG tablet, Take 40 mg by mouth 2 (two) times daily.   Current Outpatient Medications (Respiratory):    albuterol (VENTOLIN HFA) 108 (90 Base) MCG/ACT inhaler, TAKE 2 PUFFS BY MOUTH EVERY 6 HOURS AS NEEDED FOR WHEEZE OR SHORTNESS OF BREATH (Patient not taking: Reported on 02/09/2023)   guaiFENesin (MUCINEX) 600 MG 12 hr tablet, Take 600 mg by mouth at bedtime.   Current Outpatient Medications (Analgesics):    acetaminophen (TYLENOL)  500 MG tablet, Take 1,000 mg by mouth every 4 (four) hours as needed.   Current Outpatient Medications (Hematological):    clopidogrel (PLAVIX) 75 MG tablet, TAKE 1 TABLET BY MOUTH EVERY DAY (Patient taking differently: Take 75 mg by mouth daily.)   Current Outpatient Medications (Other):    Accu-Chek Softclix Lancets lancets, USED TO CHECK BLOOD GLUCOSE TWICE A DAY OR AS NEEDED   cholecalciferol (VITAMIN D3) 25 MCG (1000 UNIT) tablet, Take 1,000 Units by mouth daily.   esomeprazole (NEXIUM) 40 MG capsule, TAKE 1 CAPSULE BY MOUTH EVERY DAY  glucose blood (ACCU-CHEK GUIDE) test strip, USE TO CHECK BLOOD GLUCOSE TWICE A DAY OR AS NEEDED   hydrocortisone (ANUSOL-HC) 2.5 % rectal cream, Place 1 application rectally 2 (two) times daily.   KLOR-CON M10 10 MEQ tablet, Take by mouth.   lactulose (CHRONULAC) 10 GM/15ML solution, TAKE 30 MLS (20 G TOTAL) BY MOUTH 2 (TWO) TIMES DAILY AS NEEDED.   Melatonin 10 MG TABS, Take 10 mg by mouth at bedtime. (Patient not taking: Reported on 02/09/2023)   methylcellulose (CITRUCEL) oral powder, 1 tablespoon every 2-3 days,alternating with lactulose   neomycin-polymyxin b-dexamethasone (MAXITROL) 3.5-10000-0.1 SUSP, Place 1 drop into both eyes 4 (four) times daily.   pregabalin (LYRICA) 25 MG capsule, Take 1 capsule (25 mg total) by mouth daily.   Propylene Glycol (SYSTANE BALANCE OP), Place 1 drop into both eyes at bedtime.   sevelamer carbonate (RENVELA) 800 MG tablet, Take 800 mg by mouth 3 (three) times daily. (Patient not taking: Reported on 02/09/2023)   traZODone (DESYREL) 50 MG tablet, Take 1 tablet (50 mg total) by mouth at bedtime.   triamcinolone cream (KENALOG) 0.1 %, Apply 1 application topically daily as needed (dry/irritated skin).  Current Facility-Administered Medications (Other):    ammonium lactate (LAC-HYDRIN) 12 % lotion   Reviewed prior external information including notes and imaging from  primary care provider As well as notes that  were available from care everywhere and other healthcare systems.  Past medical history, social, surgical and family history all reviewed in electronic medical record.  No pertanent information unless stated regarding to the chief complaint.   Review of Systems:  No headache, visual changes, nausea, vomiting, diarrhea, constipation, dizziness, abdominal pain, skin rash, fevers, chills, night sweats, weight loss, swollen lymph nodes, body aches, joint swelling, chest pain, shortness of breath, mood changes. POSITIVE muscle aches  Objective  Blood pressure 120/60, pulse 92, height 5\' 7"  (1.702 m), weight 166 lb (75.3 kg), SpO2 96%.   General: No apparent distress alert and oriented x3 mood and affect normal, dressed appropriately.  HEENT: Pupils equal, extraocular movements intact  Respiratory: Patient's speak in full sentences and does not appear short of breath  Cardiovascular: No lower extremity edema, non tender, no erythema  Patient's right wrist does have still some tenderness to palpation.  Atrophy of the thenar eminence noted.  Still a mild positive Tinel's noted.  Procedure: Real-time Ultrasound Guided Injection of right carpal tunnel Device: GE Logiq Q7  Ultrasound guided injection is preferred based studies that show increased duration, increased effect, greater accuracy, decreased procedural pain, increased response rate with ultrasound guided versus blind injection.  Verbal informed consent obtained.  Time-out conducted.  Noted no overlying erythema, induration, or other signs of local infection.  Skin prepped in a sterile fashion.  Local anesthesia: Topical Ethyl chloride.  With sterile technique and under real time ultrasound guidance:  median nerve visualized.  23g 5/8 inch needle inserted distal to proximal approach into nerve sheath. Pictures taken nfor needle placement. Patient did have injection of  1 cc of 0.5% Marcaine, and 1 cc of Kenalog 40 mg/dL. Completed without  difficulty  Pain immediately resolved suggesting accurate placement of the medication.  Advised to call if fevers/chills, erythema, induration, drainage, or persistent bleeding.  Impression: Technically successful ultrasound guided injection.    Impression and Recommendations:    The above documentation has been reviewed and is accurate and complete Judi Saa, DO

## 2023-05-03 DIAGNOSIS — N186 End stage renal disease: Secondary | ICD-10-CM | POA: Diagnosis not present

## 2023-05-03 DIAGNOSIS — Z992 Dependence on renal dialysis: Secondary | ICD-10-CM | POA: Diagnosis not present

## 2023-05-04 ENCOUNTER — Ambulatory Visit: Payer: Medicare PPO | Admitting: Emergency Medicine

## 2023-05-04 ENCOUNTER — Encounter: Payer: Self-pay | Admitting: Emergency Medicine

## 2023-05-04 VITALS — BP 106/58 | HR 81 | Temp 98.0°F | Ht 67.0 in | Wt 170.8 lb

## 2023-05-04 DIAGNOSIS — R918 Other nonspecific abnormal finding of lung field: Secondary | ICD-10-CM | POA: Diagnosis not present

## 2023-05-04 DIAGNOSIS — Z01818 Encounter for other preprocedural examination: Secondary | ICD-10-CM

## 2023-05-04 DIAGNOSIS — R058 Other specified cough: Secondary | ICD-10-CM | POA: Diagnosis not present

## 2023-05-04 NOTE — Patient Instructions (Addendum)
We reviewed your CT scan of the chest today. We will plan to repeat your CT chest in August 2025 to compare with priors. Keep your albuterol available to use 2 puffs if needed for shortness of breath, chest tightness, wheezing. Continue Mucinex as needed Use your oxygen with heavy exertion.  Goal is to keep your oxygen saturations > 90% Follow Dr. Delton Coombes in 1 year, sooner if you have any problems.  We reviewed your surgical risk today.  You are low risk for oral surgery.  There are no barriers to proceeding.  We will fax a copy of this note to [facility]

## 2023-05-04 NOTE — Progress Notes (Signed)
Subjective:    Patient ID: Austin [name], male    DOB: 1935/01/10, 88 y.o.   MRN: [id number]  HPI  ROV 09/30/2022 --Austin [name] is 60 with history of hypertension and diastolic dysfunction, chronic renal insufficiency, chronic cough with upper airway instability in the setting of GERD and allergic rhinitis.  I have followed him for this as well as pulmonary nodular disease that we have been monitoring conservatively.  He is also seeing Austin [name] for insomnia, ordered Austin [name] but he never took it because he was worried about side effects.Marland Kitchen He is planning to start HD soon for progressive renal failure. He reports that his breathing is doing well.   Most recent CT chest 11/25/2021 reviewed by me showed a slight interval decrease in size in the posterior right upper lobe nodule now 15 x 11 mm, groundglass peripheral left lower lobe nodule, stable 4.5 mm subpleural right lower lobe nodule, borderline mediastinal and hilar adenopathy.   ROV 05/04/2023 --Austin [name] is 79 with chronic cough and upper airway irritation syndrome in the setting of GERD and chronic rhinitis.  He also has hypertension with diastolic dysfunction, chronic renal failure on HD.  I have followed him for his cough and for pulmonary nodular disease that we have followed with serial imaging. He needs surgical clearance for [facility] [address] He has been doing well. Rare O2 use at home. He has albuterol available, never uses it. He has some throat irritation and mucous. He is on mucinex, does not want to start an antihistamine.   Super D CT chest 03/28/2023 reviewed by me, shows slight decrease in size of the dominant spiculated posterior segment right upper lobe nodule (compared with April 2023) consistent with a benign lesion.  He has other bilateral pulmonary nodules that are all stable in size and appearance.   Review of Systems As per HPI     Objective:   Physical Exam Vitals:   05/04/23 1147  BP: (!) 106/58  Pulse: 81   Temp: 98 F (36.7 C)  TempSrc: Oral  SpO2: 96%  Weight: 170 lb 12.8 oz (77.5 kg)  Height: 5\' 7"  (1.702 m)   Gen: Pleasant, well-nourished, in no distress,  normal affect  ENT: No lesions,  mouth clear,  oropharynx clear, no postnasal drip  Neck: No JVD, no stridor  Lungs: No use of accessory muscles, no crackles or wheezing on normal respiration, no wheeze on forced expiration  Cardiovascular: RRR, heart sounds normal, no murmur or gallops, no peripheral edema  Musculoskeletal: No deformities, no cyanosis or clubbing  Neuro: alert, awake, non focal  Skin: Warm, no lesions or rash     Assessment & Plan:  Pulmonary nodules I reviewed his CT scans of the chest.  Nodular infiltrates are all stable in size, reassuring.  We will plan to repeat his CT in 1 year which is August 2025.  Upper airway cough syndrome Well-controlled at this time without any intrusive symptoms.  He uses Mucinex occasionally but has not required any allergy regimen  Preoperative evaluation of a medical condition to rule out surgical contraindications (TAR required) Based on patient's chronic conditions he is at moderate risk for surgical procedures especially those that would require general anesthesia.  He would need to come off of his Plavix for 5 days prior to tooth extraction, to then be restarted when okay with the surgeon.  There is nothing to preclude him from proceeding as long as the benefits outweigh his risks.   Austin [name]  Austin Coombes, MD, PhD 05/04/2023, 1:25 PM  Pulmonary and Critical Care [phone] or if no answer before 7:00PM call [phone] For any issues after 7:00PM please call eLink [phone]

## 2023-05-04 NOTE — Assessment & Plan Note (Signed)
I reviewed his CT scans of the chest.  Nodular infiltrates are all stable in size, reassuring.  We will plan to repeat his CT in 1 year which is August 2025.

## 2023-05-04 NOTE — Assessment & Plan Note (Signed)
Based on patient's chronic conditions he is at moderate risk for surgical procedures especially those that would require general anesthesia.  He would need to come off of his Plavix for 5 days prior to tooth extraction, to then be restarted when okay with the surgeon.  There is nothing to preclude him from proceeding as long as the benefits outweigh his risks.

## 2023-05-04 NOTE — Assessment & Plan Note (Signed)
Well-controlled at this time without any intrusive symptoms.  He uses Mucinex occasionally but has not required any allergy regimen

## 2023-05-05 DIAGNOSIS — N186 End stage renal disease: Secondary | ICD-10-CM | POA: Diagnosis not present

## 2023-05-05 DIAGNOSIS — Z992 Dependence on renal dialysis: Secondary | ICD-10-CM | POA: Diagnosis not present

## 2023-05-05 NOTE — Telephone Encounter (Signed)
Ov note with risk assessment faxed to [facility] [phone]

## 2023-05-08 ENCOUNTER — Other Ambulatory Visit: Payer: Self-pay | Admitting: Adult Health

## 2023-05-08 DIAGNOSIS — N186 End stage renal disease: Secondary | ICD-10-CM | POA: Diagnosis not present

## 2023-05-08 DIAGNOSIS — Z992 Dependence on renal dialysis: Secondary | ICD-10-CM | POA: Diagnosis not present

## 2023-05-09 NOTE — Telephone Encounter (Signed)
Okay for refill?  

## 2023-05-10 DIAGNOSIS — R262 Difficulty in walking, not elsewhere classified: Secondary | ICD-10-CM | POA: Diagnosis not present

## 2023-05-10 DIAGNOSIS — N186 End stage renal disease: Secondary | ICD-10-CM | POA: Diagnosis not present

## 2023-05-10 DIAGNOSIS — Z992 Dependence on renal dialysis: Secondary | ICD-10-CM | POA: Diagnosis not present

## 2023-05-10 DIAGNOSIS — E43 Unspecified severe protein-calorie malnutrition: Secondary | ICD-10-CM | POA: Diagnosis not present

## 2023-05-12 ENCOUNTER — Telehealth: Payer: Self-pay

## 2023-05-12 DIAGNOSIS — N186 End stage renal disease: Secondary | ICD-10-CM | POA: Diagnosis not present

## 2023-05-12 DIAGNOSIS — Z992 Dependence on renal dialysis: Secondary | ICD-10-CM | POA: Diagnosis not present

## 2023-05-12 NOTE — Telephone Encounter (Signed)
Telephone encounter was:  Successful.  05/12/2023 Name: Austin [name] MRN: [id number] DOB: 10-Jul-1935  Austin [name] is a 88 y.o. year old male who is a primary care patient of [name], NP . The community resource team was consulted for assistance with Transportation Needs   Care guide performed the following interventions: Patient provided with information about care guide support team and interviewed to confirm resource needs.Pts wife stated Pt needs transportation for 9/24. I provided long term resources for the pt [place] [employer] and [employer] over the phone as well as mailing resources . Pt has had several rides with [place] and was prompted to set up transportation with these other resources.   Follow Up Plan:  No further follow up planned at this time. The patient has been provided with needed resources.    Austin [name]  [facility] Guide, Phone: [phone] Website: [email or web address]

## 2023-05-15 DIAGNOSIS — Z992 Dependence on renal dialysis: Secondary | ICD-10-CM | POA: Diagnosis not present

## 2023-05-15 DIAGNOSIS — N186 End stage renal disease: Secondary | ICD-10-CM | POA: Diagnosis not present

## 2023-05-17 DIAGNOSIS — Z992 Dependence on renal dialysis: Secondary | ICD-10-CM | POA: Diagnosis not present

## 2023-05-17 DIAGNOSIS — N186 End stage renal disease: Secondary | ICD-10-CM | POA: Diagnosis not present

## 2023-05-19 DIAGNOSIS — Z992 Dependence on renal dialysis: Secondary | ICD-10-CM | POA: Diagnosis not present

## 2023-05-19 DIAGNOSIS — N186 End stage renal disease: Secondary | ICD-10-CM | POA: Diagnosis not present

## 2023-05-22 DIAGNOSIS — N186 End stage renal disease: Secondary | ICD-10-CM | POA: Diagnosis not present

## 2023-05-22 DIAGNOSIS — Z992 Dependence on renal dialysis: Secondary | ICD-10-CM | POA: Diagnosis not present

## 2023-05-22 DIAGNOSIS — E1022 Type 1 diabetes mellitus with diabetic chronic kidney disease: Secondary | ICD-10-CM | POA: Diagnosis not present

## 2023-05-24 DIAGNOSIS — N186 End stage renal disease: Secondary | ICD-10-CM | POA: Diagnosis not present

## 2023-05-24 DIAGNOSIS — N2581 Secondary hyperparathyroidism of renal origin: Secondary | ICD-10-CM | POA: Diagnosis not present

## 2023-05-24 DIAGNOSIS — Z992 Dependence on renal dialysis: Secondary | ICD-10-CM | POA: Diagnosis not present

## 2023-05-25 DIAGNOSIS — R310 Gross hematuria: Secondary | ICD-10-CM | POA: Diagnosis not present

## 2023-05-25 DIAGNOSIS — C61 Malignant neoplasm of prostate: Secondary | ICD-10-CM | POA: Diagnosis not present

## 2023-05-25 DIAGNOSIS — Z8546 Personal history of malignant neoplasm of prostate: Secondary | ICD-10-CM | POA: Diagnosis not present

## 2023-05-26 DIAGNOSIS — Z992 Dependence on renal dialysis: Secondary | ICD-10-CM | POA: Diagnosis not present

## 2023-05-26 DIAGNOSIS — N2581 Secondary hyperparathyroidism of renal origin: Secondary | ICD-10-CM | POA: Diagnosis not present

## 2023-05-26 DIAGNOSIS — N186 End stage renal disease: Secondary | ICD-10-CM | POA: Diagnosis not present

## 2023-05-29 DIAGNOSIS — N186 End stage renal disease: Secondary | ICD-10-CM | POA: Diagnosis not present

## 2023-05-29 DIAGNOSIS — N2581 Secondary hyperparathyroidism of renal origin: Secondary | ICD-10-CM | POA: Diagnosis not present

## 2023-05-29 DIAGNOSIS — Z992 Dependence on renal dialysis: Secondary | ICD-10-CM | POA: Diagnosis not present

## 2023-05-30 ENCOUNTER — Ambulatory Visit: Payer: Medicare PPO | Admitting: Family Medicine

## 2023-05-31 DIAGNOSIS — N186 End stage renal disease: Secondary | ICD-10-CM | POA: Diagnosis not present

## 2023-05-31 DIAGNOSIS — Z992 Dependence on renal dialysis: Secondary | ICD-10-CM | POA: Diagnosis not present

## 2023-05-31 DIAGNOSIS — N2581 Secondary hyperparathyroidism of renal origin: Secondary | ICD-10-CM | POA: Diagnosis not present

## 2023-06-01 DIAGNOSIS — I5033 Acute on chronic diastolic (congestive) heart failure: Secondary | ICD-10-CM | POA: Diagnosis not present

## 2023-06-01 DIAGNOSIS — I509 Heart failure, unspecified: Secondary | ICD-10-CM | POA: Diagnosis not present

## 2023-06-01 DIAGNOSIS — J9601 Acute respiratory failure with hypoxia: Secondary | ICD-10-CM | POA: Diagnosis not present

## 2023-06-01 DIAGNOSIS — R531 Weakness: Secondary | ICD-10-CM | POA: Diagnosis not present

## 2023-06-02 DIAGNOSIS — N2581 Secondary hyperparathyroidism of renal origin: Secondary | ICD-10-CM | POA: Insufficient documentation

## 2023-06-02 DIAGNOSIS — N186 End stage renal disease: Secondary | ICD-10-CM | POA: Diagnosis not present

## 2023-06-02 DIAGNOSIS — Z992 Dependence on renal dialysis: Secondary | ICD-10-CM | POA: Diagnosis not present

## 2023-06-05 DIAGNOSIS — N186 End stage renal disease: Secondary | ICD-10-CM | POA: Diagnosis not present

## 2023-06-05 DIAGNOSIS — Z992 Dependence on renal dialysis: Secondary | ICD-10-CM | POA: Diagnosis not present

## 2023-06-05 DIAGNOSIS — N2581 Secondary hyperparathyroidism of renal origin: Secondary | ICD-10-CM | POA: Diagnosis not present

## 2023-06-06 DIAGNOSIS — I350 Nonrheumatic aortic (valve) stenosis: Secondary | ICD-10-CM | POA: Diagnosis not present

## 2023-06-06 DIAGNOSIS — I5032 Chronic diastolic (congestive) heart failure: Secondary | ICD-10-CM | POA: Diagnosis not present

## 2023-06-06 DIAGNOSIS — I351 Nonrheumatic aortic (valve) insufficiency: Secondary | ICD-10-CM | POA: Diagnosis not present

## 2023-06-06 DIAGNOSIS — I10 Essential (primary) hypertension: Secondary | ICD-10-CM | POA: Diagnosis not present

## 2023-06-07 ENCOUNTER — Other Ambulatory Visit: Payer: Self-pay | Admitting: Adult Health

## 2023-06-07 DIAGNOSIS — N2581 Secondary hyperparathyroidism of renal origin: Secondary | ICD-10-CM | POA: Diagnosis not present

## 2023-06-07 DIAGNOSIS — Z992 Dependence on renal dialysis: Secondary | ICD-10-CM | POA: Diagnosis not present

## 2023-06-07 DIAGNOSIS — N186 End stage renal disease: Secondary | ICD-10-CM | POA: Diagnosis not present

## 2023-06-09 DIAGNOSIS — N2581 Secondary hyperparathyroidism of renal origin: Secondary | ICD-10-CM | POA: Diagnosis not present

## 2023-06-09 DIAGNOSIS — N186 End stage renal disease: Secondary | ICD-10-CM | POA: Diagnosis not present

## 2023-06-09 DIAGNOSIS — R262 Difficulty in walking, not elsewhere classified: Secondary | ICD-10-CM | POA: Diagnosis not present

## 2023-06-09 DIAGNOSIS — Z992 Dependence on renal dialysis: Secondary | ICD-10-CM | POA: Diagnosis not present

## 2023-06-09 DIAGNOSIS — E43 Unspecified severe protein-calorie malnutrition: Secondary | ICD-10-CM | POA: Diagnosis not present

## 2023-06-12 DIAGNOSIS — N186 End stage renal disease: Secondary | ICD-10-CM | POA: Diagnosis not present

## 2023-06-12 DIAGNOSIS — Z992 Dependence on renal dialysis: Secondary | ICD-10-CM | POA: Diagnosis not present

## 2023-06-12 DIAGNOSIS — N2581 Secondary hyperparathyroidism of renal origin: Secondary | ICD-10-CM | POA: Diagnosis not present

## 2023-06-14 DIAGNOSIS — N186 End stage renal disease: Secondary | ICD-10-CM | POA: Diagnosis not present

## 2023-06-14 DIAGNOSIS — N2581 Secondary hyperparathyroidism of renal origin: Secondary | ICD-10-CM | POA: Diagnosis not present

## 2023-06-14 DIAGNOSIS — Z992 Dependence on renal dialysis: Secondary | ICD-10-CM | POA: Diagnosis not present

## 2023-06-15 DIAGNOSIS — R310 Gross hematuria: Secondary | ICD-10-CM | POA: Diagnosis not present

## 2023-06-15 DIAGNOSIS — C61 Malignant neoplasm of prostate: Secondary | ICD-10-CM | POA: Diagnosis not present

## 2023-06-16 DIAGNOSIS — Z992 Dependence on renal dialysis: Secondary | ICD-10-CM | POA: Diagnosis not present

## 2023-06-16 DIAGNOSIS — N186 End stage renal disease: Secondary | ICD-10-CM | POA: Diagnosis not present

## 2023-06-16 DIAGNOSIS — N2581 Secondary hyperparathyroidism of renal origin: Secondary | ICD-10-CM | POA: Diagnosis not present

## 2023-06-18 ENCOUNTER — Other Ambulatory Visit: Payer: Self-pay | Admitting: Adult Health

## 2023-06-19 DIAGNOSIS — N186 End stage renal disease: Secondary | ICD-10-CM | POA: Diagnosis not present

## 2023-06-19 DIAGNOSIS — N2581 Secondary hyperparathyroidism of renal origin: Secondary | ICD-10-CM | POA: Diagnosis not present

## 2023-06-19 DIAGNOSIS — Z992 Dependence on renal dialysis: Secondary | ICD-10-CM | POA: Diagnosis not present

## 2023-06-21 DIAGNOSIS — N2581 Secondary hyperparathyroidism of renal origin: Secondary | ICD-10-CM | POA: Diagnosis not present

## 2023-06-21 DIAGNOSIS — N186 End stage renal disease: Secondary | ICD-10-CM | POA: Diagnosis not present

## 2023-06-21 DIAGNOSIS — Z992 Dependence on renal dialysis: Secondary | ICD-10-CM | POA: Diagnosis not present

## 2023-06-22 DIAGNOSIS — E1022 Type 1 diabetes mellitus with diabetic chronic kidney disease: Secondary | ICD-10-CM | POA: Diagnosis not present

## 2023-06-22 DIAGNOSIS — N186 End stage renal disease: Secondary | ICD-10-CM | POA: Diagnosis not present

## 2023-06-22 DIAGNOSIS — Z992 Dependence on renal dialysis: Secondary | ICD-10-CM | POA: Diagnosis not present

## 2023-06-23 DIAGNOSIS — N2581 Secondary hyperparathyroidism of renal origin: Secondary | ICD-10-CM | POA: Diagnosis not present

## 2023-06-23 DIAGNOSIS — N186 End stage renal disease: Secondary | ICD-10-CM | POA: Diagnosis not present

## 2023-06-23 DIAGNOSIS — Z992 Dependence on renal dialysis: Secondary | ICD-10-CM | POA: Diagnosis not present

## 2023-06-26 DIAGNOSIS — N186 End stage renal disease: Secondary | ICD-10-CM | POA: Diagnosis not present

## 2023-06-26 DIAGNOSIS — N2581 Secondary hyperparathyroidism of renal origin: Secondary | ICD-10-CM | POA: Diagnosis not present

## 2023-06-26 DIAGNOSIS — Z992 Dependence on renal dialysis: Secondary | ICD-10-CM | POA: Diagnosis not present

## 2023-06-28 DIAGNOSIS — N186 End stage renal disease: Secondary | ICD-10-CM | POA: Diagnosis not present

## 2023-06-28 DIAGNOSIS — N2581 Secondary hyperparathyroidism of renal origin: Secondary | ICD-10-CM | POA: Diagnosis not present

## 2023-06-28 DIAGNOSIS — Z992 Dependence on renal dialysis: Secondary | ICD-10-CM | POA: Diagnosis not present

## 2023-06-29 DIAGNOSIS — I719 Aortic aneurysm of unspecified site, without rupture: Secondary | ICD-10-CM | POA: Diagnosis not present

## 2023-06-29 DIAGNOSIS — R001 Bradycardia, unspecified: Secondary | ICD-10-CM | POA: Diagnosis not present

## 2023-06-29 DIAGNOSIS — I132 Hypertensive heart and chronic kidney disease with heart failure and with stage 5 chronic kidney disease, or end stage renal disease: Secondary | ICD-10-CM | POA: Diagnosis not present

## 2023-06-29 DIAGNOSIS — Z9181 History of falling: Secondary | ICD-10-CM | POA: Diagnosis not present

## 2023-06-29 DIAGNOSIS — E1151 Type 2 diabetes mellitus with diabetic peripheral angiopathy without gangrene: Secondary | ICD-10-CM | POA: Diagnosis not present

## 2023-06-29 DIAGNOSIS — K219 Gastro-esophageal reflux disease without esophagitis: Secondary | ICD-10-CM | POA: Diagnosis not present

## 2023-06-29 DIAGNOSIS — I509 Heart failure, unspecified: Secondary | ICD-10-CM | POA: Diagnosis not present

## 2023-06-29 DIAGNOSIS — E785 Hyperlipidemia, unspecified: Secondary | ICD-10-CM | POA: Diagnosis not present

## 2023-06-29 DIAGNOSIS — E1136 Type 2 diabetes mellitus with diabetic cataract: Secondary | ICD-10-CM | POA: Diagnosis not present

## 2023-06-29 DIAGNOSIS — N529 Male erectile dysfunction, unspecified: Secondary | ICD-10-CM | POA: Diagnosis not present

## 2023-06-29 DIAGNOSIS — Z9981 Dependence on supplemental oxygen: Secondary | ICD-10-CM | POA: Diagnosis not present

## 2023-06-29 DIAGNOSIS — Z7902 Long term (current) use of antithrombotics/antiplatelets: Secondary | ICD-10-CM | POA: Diagnosis not present

## 2023-06-29 DIAGNOSIS — I2510 Atherosclerotic heart disease of native coronary artery without angina pectoris: Secondary | ICD-10-CM | POA: Diagnosis not present

## 2023-06-29 DIAGNOSIS — H353290 Exudative age-related macular degeneration, unspecified eye, stage unspecified: Secondary | ICD-10-CM | POA: Diagnosis not present

## 2023-06-29 DIAGNOSIS — I700 Atherosclerosis of aorta: Secondary | ICD-10-CM | POA: Diagnosis not present

## 2023-06-29 DIAGNOSIS — K5900 Constipation, unspecified: Secondary | ICD-10-CM | POA: Diagnosis not present

## 2023-06-29 DIAGNOSIS — E1122 Type 2 diabetes mellitus with diabetic chronic kidney disease: Secondary | ICD-10-CM | POA: Diagnosis not present

## 2023-06-29 DIAGNOSIS — R269 Unspecified abnormalities of gait and mobility: Secondary | ICD-10-CM | POA: Diagnosis not present

## 2023-06-30 DIAGNOSIS — N2581 Secondary hyperparathyroidism of renal origin: Secondary | ICD-10-CM | POA: Diagnosis not present

## 2023-06-30 DIAGNOSIS — N186 End stage renal disease: Secondary | ICD-10-CM | POA: Diagnosis not present

## 2023-06-30 DIAGNOSIS — Z992 Dependence on renal dialysis: Secondary | ICD-10-CM | POA: Diagnosis not present

## 2023-07-02 DIAGNOSIS — J9601 Acute respiratory failure with hypoxia: Secondary | ICD-10-CM | POA: Diagnosis not present

## 2023-07-02 DIAGNOSIS — I509 Heart failure, unspecified: Secondary | ICD-10-CM | POA: Diagnosis not present

## 2023-07-02 DIAGNOSIS — I5033 Acute on chronic diastolic (congestive) heart failure: Secondary | ICD-10-CM | POA: Diagnosis not present

## 2023-07-02 DIAGNOSIS — R531 Weakness: Secondary | ICD-10-CM | POA: Diagnosis not present

## 2023-07-03 DIAGNOSIS — N2581 Secondary hyperparathyroidism of renal origin: Secondary | ICD-10-CM | POA: Diagnosis not present

## 2023-07-03 DIAGNOSIS — Z992 Dependence on renal dialysis: Secondary | ICD-10-CM | POA: Diagnosis not present

## 2023-07-03 DIAGNOSIS — N186 End stage renal disease: Secondary | ICD-10-CM | POA: Diagnosis not present

## 2023-07-05 DIAGNOSIS — N2581 Secondary hyperparathyroidism of renal origin: Secondary | ICD-10-CM | POA: Diagnosis not present

## 2023-07-05 DIAGNOSIS — Z992 Dependence on renal dialysis: Secondary | ICD-10-CM | POA: Diagnosis not present

## 2023-07-05 DIAGNOSIS — N186 End stage renal disease: Secondary | ICD-10-CM | POA: Diagnosis not present

## 2023-07-07 DIAGNOSIS — N2581 Secondary hyperparathyroidism of renal origin: Secondary | ICD-10-CM | POA: Diagnosis not present

## 2023-07-07 DIAGNOSIS — N186 End stage renal disease: Secondary | ICD-10-CM | POA: Diagnosis not present

## 2023-07-07 DIAGNOSIS — Z992 Dependence on renal dialysis: Secondary | ICD-10-CM | POA: Diagnosis not present

## 2023-07-10 DIAGNOSIS — R262 Difficulty in walking, not elsewhere classified: Secondary | ICD-10-CM | POA: Diagnosis not present

## 2023-07-10 DIAGNOSIS — N186 End stage renal disease: Secondary | ICD-10-CM | POA: Diagnosis not present

## 2023-07-10 DIAGNOSIS — N2581 Secondary hyperparathyroidism of renal origin: Secondary | ICD-10-CM | POA: Diagnosis not present

## 2023-07-10 DIAGNOSIS — Z992 Dependence on renal dialysis: Secondary | ICD-10-CM | POA: Diagnosis not present

## 2023-07-10 DIAGNOSIS — E43 Unspecified severe protein-calorie malnutrition: Secondary | ICD-10-CM | POA: Diagnosis not present

## 2023-07-11 ENCOUNTER — Ambulatory Visit: Payer: Medicare PPO | Admitting: Family Medicine

## 2023-07-11 DIAGNOSIS — H25011 Cortical age-related cataract, right eye: Secondary | ICD-10-CM | POA: Diagnosis not present

## 2023-07-11 DIAGNOSIS — H2511 Age-related nuclear cataract, right eye: Secondary | ICD-10-CM | POA: Diagnosis not present

## 2023-07-11 DIAGNOSIS — E113293 Type 2 diabetes mellitus with mild nonproliferative diabetic retinopathy without macular edema, bilateral: Secondary | ICD-10-CM | POA: Diagnosis not present

## 2023-07-11 DIAGNOSIS — H25041 Posterior subcapsular polar age-related cataract, right eye: Secondary | ICD-10-CM | POA: Diagnosis not present

## 2023-07-12 DIAGNOSIS — N2581 Secondary hyperparathyroidism of renal origin: Secondary | ICD-10-CM | POA: Diagnosis not present

## 2023-07-12 DIAGNOSIS — Z992 Dependence on renal dialysis: Secondary | ICD-10-CM | POA: Diagnosis not present

## 2023-07-12 DIAGNOSIS — N186 End stage renal disease: Secondary | ICD-10-CM | POA: Diagnosis not present

## 2023-07-12 NOTE — Progress Notes (Unsigned)
[name] [address] [place] [address] Phone: [phone] Subjective:   Austin [name], am serving as a scribe for Dr. Antoine Primas.  I'm seeing this patient by the request  of:  [name], NP  CC: Right wrist pain noted previously  [id number]  05/02/2023 Chronic problem that was better but still having some numbness noted.  Discussed icing regimen and home exercises, discussed which activities to do and he plans to increase activity slowly over the course of next several weeks.  Follow-up again in 6 to 8 weeks.      Update 07/13/2023 Austin [name] is a 88 y.o. male coming in with complaint of R carpal tunnel pain. Patient states that he is doing ok but still has tingling in all of his fingers. No longe rhaving pain in palm.        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 BLOOD LOSS 03/13/2007   CAROTID ARTERY STENOSIS 05/10/2010   CHF (congestive heart failure) (HCC)    DIABETES MELLITUS, TYPE II 09/19/2007   DISEASE, CEREBROVASCULAR NEC 03/05/2007   GERD 03/13/2007   HYPERLIPIDEMIA 03/05/2007   HYPERTENSION 03/05/2007   HYPOKALEMIA 11/09/2009   KNEE PAIN, RIGHT 11/09/2009   PROSTATE CANCER, HX OF 03/05/2007   RENAL DISEASE, CHRONIC 02/03/2009   Past Surgical History:  Procedure Laterality Date   AV FISTULA PLACEMENT Left 10/18/2022   Procedure: INSERTION OF LEFT ARM BRACHIAL ARTERY TO AXILLARY VEIN ARTERIOVENOUS (AV) GORE-TEX GRAFT;  Surgeon: Victorino Sparrow, MD;  [facility] OR;  Service: Vascular;  Laterality: Left;   CAROTID ARTERY ANGIOPLASTY Right Oct. 10, 2001   ESOPHAGOGASTRODUODENOSCOPY (EGD) WITH PROPOFOL N/A 11/04/2016   Procedure: ESOPHAGOGASTRODUODENOSCOPY (EGD) WITH PROPOFOL;  Surgeon: Kathi Der, MD;  Location: [facility] ENDOSCOPY;  Service:  Gastroenterology;  Laterality: N/A;   IR FLUORO GUIDE CV LINE RIGHT  10/25/2022   IR FLUORO GUIDE CV LINE RIGHT  10/27/2022   IR US GUIDE VASC ACCESS RIGHT  10/25/2022   IR US GUIDE VASC ACCESS RIGHT  10/27/2022   LAPAROSCOPIC APPENDECTOMY  02/20/2012   Procedure: APPENDECTOMY LAPAROSCOPIC;  Surgeon: Almond Lint, MD;  Location: [facility] OR;  Service: General;  Laterality: N/A;   PROSTATE SURGERY     prostatectomy   Social History   Socioeconomic History   Marital status: Married    Spouse name: Not on file   Number of children: Not on file   Years of education: Not on file   Highest education level: Not on file  Occupational History   Not on file  Tobacco Use   Smoking status: Former    Current packs/day: 0.00    Average packs/day: 1 pack/day for 10.0 years (10.0 ttl pk-yrs)    Types: Cigarettes    Start date: 08/22/1964    Quit date: 08/22/1974    Years since quitting: 48.9   Smokeless tobacco: Never  Vaping Use   Vaping status: Never Used  Substance and Sexual Activity   Alcohol use: No    Alcohol/week: 0.0 standard drinks of alcohol   Drug use: No   Sexual activity: Not Currently  Other Topics Concern   Not on file  Social History Narrative   Retired - Administrator, arts    Married 53 years       He enjoys traveling    Social Determinants  of Health   Financial Resource Strain: Low Risk  (02/09/2023)   Overall Financial Resource Strain (CARDIA)    Difficulty of Paying Living Expenses: Not very hard  Food Insecurity: No Food Insecurity (02/09/2023)   Hunger Vital Sign    Worried About Running Out of Food in the Last Year: Never true    Ran Out of Food in the Last Year: Never true  Transportation Needs: No Transportation Needs (07/27/2022)   PRAPARE - Administrator, Civil Service (Medical): No    Lack of Transportation (Non-Medical): No  Physical Activity: Insufficiently Active (02/09/2023)   Exercise Vital Sign    Days of Exercise per Week: 3 days    Minutes of Exercise  per Session: 30 min  Stress: No Stress Concern Present (02/09/2023)   [employer] of Occupational Health - Occupational Stress Questionnaire    Feeling of Stress : Not at all  Social Connections: Moderately Integrated (02/09/2023)   Social Connection and Isolation Panel [NHANES]    Frequency of Communication with Friends and Family: Twice a week    Frequency of Social Gatherings with Friends and Family: Never    Attends Religious Services: More than 4 times per year    Active Member of [employer] or Organizations: Yes    Attends Engineer, structural: More than 4 times per year    Marital Status: Married   Allergies  Allergen Reactions   Aspirin Other (See Comments)    High doses causes stomach ulcer and bleeding   Family History  Problem Relation Age of Onset   Hypertension Mother    Cancer Father        Mesothelioma    Stomach cancer Brother    Cancer Brother    Cancer - Cervical Brother    Cancer Brother    Diabetes Brother    Esophageal cancer Neg Hx    Colon cancer Neg Hx    Pancreatic cancer Neg Hx       Current Outpatient Medications (Cardiovascular):    amLODipine (NORVASC) 10 MG tablet, Take 1 tablet (10 mg total) by mouth daily.   atorvastatin (LIPITOR) 20 MG tablet, TAKE 1 TABLET BY MOUTH EVERY DAY   cloNIDine (CATAPRES) 0.3 MG tablet, Take 1 tablet (0.3 mg total) by mouth 3 (three) times daily. (Patient taking differently: Take 0.3 mg by mouth 2 (two) times daily.)   doxazosin (CARDURA) 2 MG tablet, Take 2 mg by mouth at bedtime.    isosorbide-hydrALAZINE (BIDIL) 20-37.5 MG tablet, Take 1 tablet by mouth 3 (three) times daily.   metolazone (ZAROXOLYN) 5 MG tablet, Take 5 mg by mouth daily as needed.   torsemide (DEMADEX) 20 MG tablet, Take 40 mg by mouth 2 (two) times daily.   metoprolol succinate (TOPROL-XL) 100 MG 24 hr tablet, Take 1 tablet (100 mg total) by mouth daily. Take with or immediately following a meal.   Current Outpatient Medications  (Respiratory):    albuterol (VENTOLIN HFA) 108 (90 Base) MCG/ACT inhaler, TAKE 2 PUFFS BY MOUTH EVERY 6 HOURS AS NEEDED FOR WHEEZE OR SHORTNESS OF BREATH   guaiFENesin (MUCINEX) 600 MG 12 hr tablet, Take 600 mg by mouth at bedtime.   Current Outpatient Medications (Analgesics):    acetaminophen (TYLENOL) 500 MG tablet, Take 1,000 mg by mouth every 4 (four) hours as needed.   Current Outpatient Medications (Hematological):    clopidogrel (PLAVIX) 75 MG tablet, TAKE 1 TABLET BY MOUTH EVERY DAY (Patient taking differently: Take 75 mg by mouth  daily.)   Current Outpatient Medications (Other):    Accu-Chek Softclix Lancets lancets, USED TO CHECK BLOOD GLUCOSE TWICE A DAY OR AS NEEDED   cholecalciferol (VITAMIN D3) 25 MCG (1000 UNIT) tablet, Take 1,000 Units by mouth daily.   esomeprazole (NEXIUM) 40 MG capsule, TAKE 1 CAPSULE BY MOUTH EVERY DAY   glucose blood (ACCU-CHEK GUIDE) test strip, USE TO CHECK BLOOD GLUCOSE TWICE A DAY OR AS NEEDED   hydrocortisone (ANUSOL-HC) 2.5 % rectal cream, Place 1 application rectally 2 (two) times daily.   KLOR-CON M10 10 MEQ tablet, Take by mouth.   lactulose (CHRONULAC) 10 GM/15ML solution, TAKE 30 MLS (20 G TOTAL) BY MOUTH 2 (TWO) TIMES DAILY AS NEEDED.   Melatonin 10 MG TABS, Take 10 mg by mouth at bedtime.   methylcellulose (CITRUCEL) oral powder, 1 tablespoon every 2-3 days,alternating with lactulose   neomycin-polymyxin b-dexamethasone (MAXITROL) 3.5-10000-0.1 SUSP, Place 1 drop into both eyes 4 (four) times daily.   pregabalin (LYRICA) 25 MG capsule, TAKE 1 CAPSULE BY MOUTH EVERY DAY   Propylene Glycol (SYSTANE BALANCE OP), Place 1 drop into both eyes at bedtime.   sevelamer carbonate (RENVELA) 800 MG tablet, Take 800 mg by mouth 3 (three) times daily.   traZODone (DESYREL) 50 MG tablet, Take 1 tablet (50 mg total) by mouth at bedtime.   triamcinolone cream (KENALOG) 0.1 %, Apply 1 application topically daily as needed (dry/irritated skin).  Current  Facility-Administered Medications (Other):    ammonium lactate (LAC-HYDRIN) 12 % lotion   Reviewed prior external information including notes and imaging from  primary care provider As well as notes that were available from care everywhere and other healthcare systems.  Past medical history, social, surgical and family history all reviewed in electronic medical record.  No pertanent information unless stated regarding to the chief complaint.   Review of Systems:  No headache, visual changes, nausea, vomiting, diarrhea, constipation, dizziness, abdominal pain, skin rash, fevers, chills, night sweats, weight loss, swollen lymph nodes, body aches, joint swelling, chest pain, shortness of breath, mood changes. POSITIVE muscle aches  Objective  Blood pressure (!) 124/50, pulse 73, height 5\' 7"  (1.702 m), weight 174 lb (78.9 kg), SpO2 95%.   General: No apparent distress alert and oriented x3 mood and affect normal, dressed appropriately.  HEENT: Pupils equal, extraocular movements intact  Respiratory: Patient's speak in full sentences and does not appear short of breath  Cardiovascular: No lower extremity edema, non tender, no erythema  Right wrist exam shows patient does have numbness noted.  Mild thenar eminence wasting noted.  Negative Tinel's noted.  Still has relatively good strength in the hand noted.   Limited muscular skeletal ultrasound was performed and interpreted by Antoine Primas, M  Patient's right median nerve does have a bifid aspect noted, significant decrease in the hypoechoic changes from previous exam both noted.   Impression: Bifid median nerve but interval improvement with the swelling   Impression and Recommendations:    The above documentation has been reviewed and is accurate and complete Judi Saa, DO

## 2023-07-13 ENCOUNTER — Other Ambulatory Visit: Payer: Self-pay

## 2023-07-13 ENCOUNTER — Encounter: Payer: Self-pay | Admitting: Family Medicine

## 2023-07-13 ENCOUNTER — Ambulatory Visit: Payer: Medicare PPO | Admitting: Family Medicine

## 2023-07-13 VITALS — BP 124/50 | HR 73 | Ht 67.0 in | Wt 174.0 lb

## 2023-07-13 DIAGNOSIS — G5601 Carpal tunnel syndrome, right upper limb: Secondary | ICD-10-CM | POA: Diagnosis not present

## 2023-07-13 DIAGNOSIS — M25531 Pain in right wrist: Secondary | ICD-10-CM | POA: Diagnosis not present

## 2023-07-13 NOTE — Assessment & Plan Note (Signed)
Severe carpal tunnel noted on EMG.  We did discuss with patient about the possibility of surgical intervention.  Patient declined at the moment.  Discussed which activities to do and which ones to avoid.  Discussed home exercises and icing regimen.  Patient could have repeat injection but would like to hold again if necessary for now with patient not having pain.  Patient as well as his spouse agree with the plan.  Follow-up again in 10 to 12 weeks for further evaluation.

## 2023-07-13 NOTE — Patient Instructions (Signed)
See me again in 10weeks 

## 2023-07-14 DIAGNOSIS — N186 End stage renal disease: Secondary | ICD-10-CM | POA: Diagnosis not present

## 2023-07-14 DIAGNOSIS — Z992 Dependence on renal dialysis: Secondary | ICD-10-CM | POA: Diagnosis not present

## 2023-07-14 DIAGNOSIS — N2581 Secondary hyperparathyroidism of renal origin: Secondary | ICD-10-CM | POA: Diagnosis not present

## 2023-07-15 ENCOUNTER — Other Ambulatory Visit: Payer: Self-pay | Admitting: Adult Health

## 2023-07-16 DIAGNOSIS — N2581 Secondary hyperparathyroidism of renal origin: Secondary | ICD-10-CM | POA: Diagnosis not present

## 2023-07-16 DIAGNOSIS — Z992 Dependence on renal dialysis: Secondary | ICD-10-CM | POA: Diagnosis not present

## 2023-07-16 DIAGNOSIS — N186 End stage renal disease: Secondary | ICD-10-CM | POA: Diagnosis not present

## 2023-07-18 DIAGNOSIS — N186 End stage renal disease: Secondary | ICD-10-CM | POA: Diagnosis not present

## 2023-07-18 DIAGNOSIS — Z992 Dependence on renal dialysis: Secondary | ICD-10-CM | POA: Diagnosis not present

## 2023-07-18 DIAGNOSIS — N2581 Secondary hyperparathyroidism of renal origin: Secondary | ICD-10-CM | POA: Diagnosis not present

## 2023-07-21 DIAGNOSIS — Z992 Dependence on renal dialysis: Secondary | ICD-10-CM | POA: Diagnosis not present

## 2023-07-21 DIAGNOSIS — N2581 Secondary hyperparathyroidism of renal origin: Secondary | ICD-10-CM | POA: Diagnosis not present

## 2023-07-21 DIAGNOSIS — N186 End stage renal disease: Secondary | ICD-10-CM | POA: Diagnosis not present

## 2023-07-22 DIAGNOSIS — Z992 Dependence on renal dialysis: Secondary | ICD-10-CM | POA: Diagnosis not present

## 2023-07-22 DIAGNOSIS — E1022 Type 1 diabetes mellitus with diabetic chronic kidney disease: Secondary | ICD-10-CM | POA: Diagnosis not present

## 2023-07-22 DIAGNOSIS — N186 End stage renal disease: Secondary | ICD-10-CM | POA: Diagnosis not present

## 2023-07-24 DIAGNOSIS — Z992 Dependence on renal dialysis: Secondary | ICD-10-CM | POA: Diagnosis not present

## 2023-07-24 DIAGNOSIS — N2581 Secondary hyperparathyroidism of renal origin: Secondary | ICD-10-CM | POA: Diagnosis not present

## 2023-07-24 DIAGNOSIS — N186 End stage renal disease: Secondary | ICD-10-CM | POA: Diagnosis not present

## 2023-07-26 DIAGNOSIS — Z992 Dependence on renal dialysis: Secondary | ICD-10-CM | POA: Diagnosis not present

## 2023-07-26 DIAGNOSIS — N2581 Secondary hyperparathyroidism of renal origin: Secondary | ICD-10-CM | POA: Diagnosis not present

## 2023-07-26 DIAGNOSIS — N186 End stage renal disease: Secondary | ICD-10-CM | POA: Diagnosis not present

## 2023-07-27 DIAGNOSIS — H25011 Cortical age-related cataract, right eye: Secondary | ICD-10-CM | POA: Diagnosis not present

## 2023-07-27 DIAGNOSIS — H25041 Posterior subcapsular polar age-related cataract, right eye: Secondary | ICD-10-CM | POA: Diagnosis not present

## 2023-07-27 DIAGNOSIS — H25811 Combined forms of age-related cataract, right eye: Secondary | ICD-10-CM | POA: Diagnosis not present

## 2023-07-27 DIAGNOSIS — H2511 Age-related nuclear cataract, right eye: Secondary | ICD-10-CM | POA: Diagnosis not present

## 2023-07-28 DIAGNOSIS — N2581 Secondary hyperparathyroidism of renal origin: Secondary | ICD-10-CM | POA: Diagnosis not present

## 2023-07-28 DIAGNOSIS — Z992 Dependence on renal dialysis: Secondary | ICD-10-CM | POA: Diagnosis not present

## 2023-07-28 DIAGNOSIS — N186 End stage renal disease: Secondary | ICD-10-CM | POA: Diagnosis not present

## 2023-07-31 DIAGNOSIS — N186 End stage renal disease: Secondary | ICD-10-CM | POA: Diagnosis not present

## 2023-07-31 DIAGNOSIS — Z992 Dependence on renal dialysis: Secondary | ICD-10-CM | POA: Diagnosis not present

## 2023-07-31 DIAGNOSIS — N2581 Secondary hyperparathyroidism of renal origin: Secondary | ICD-10-CM | POA: Diagnosis not present

## 2023-08-02 DIAGNOSIS — N2581 Secondary hyperparathyroidism of renal origin: Secondary | ICD-10-CM | POA: Diagnosis not present

## 2023-08-02 DIAGNOSIS — N186 End stage renal disease: Secondary | ICD-10-CM | POA: Diagnosis not present

## 2023-08-02 DIAGNOSIS — Z992 Dependence on renal dialysis: Secondary | ICD-10-CM | POA: Diagnosis not present

## 2023-08-03 ENCOUNTER — Encounter: Payer: Self-pay | Admitting: Podiatry

## 2023-08-03 ENCOUNTER — Ambulatory Visit (INDEPENDENT_AMBULATORY_CARE_PROVIDER_SITE_OTHER): Payer: Medicare PPO | Admitting: Podiatry

## 2023-08-03 DIAGNOSIS — M79675 Pain in left toe(s): Secondary | ICD-10-CM | POA: Diagnosis not present

## 2023-08-03 DIAGNOSIS — B351 Tinea unguium: Secondary | ICD-10-CM | POA: Diagnosis not present

## 2023-08-03 DIAGNOSIS — E0821 Diabetes mellitus due to underlying condition with diabetic nephropathy: Secondary | ICD-10-CM | POA: Diagnosis not present

## 2023-08-03 DIAGNOSIS — M79674 Pain in right toe(s): Secondary | ICD-10-CM | POA: Diagnosis not present

## 2023-08-03 NOTE — Progress Notes (Signed)
This patient returns to my office for at risk foot care.  This patient requires this care by a professional since this patient will be at risk due to having diabetic neuropathy and CKD   This patient is unable to cut nails himself since the patient cannot reach his nails.These nails are painful walking and wearing shoes.  This patient presents for at risk foot care today.  General Appearance  Alert, conversant and in no acute stress.  Vascular  Dorsalis pedis and posterior tibial  pulses are weakly  palpable  bilaterally.  Capillary return is within normal limits  bilaterally. Temperature is within normal limits  bilaterally.  Neurologic  Senn-Weinstein monofilament wire test diminished bilaterally. Muscle power within normal limits bilaterally.  Nails Thick disfigured discolored nails with subungual debris  from hallux to fifth toes bilaterally. No evidence of bacterial infection or drainage bilaterally.  Orthopedic  No limitations of motion  feet .  No crepitus or effusions noted.  No bony pathology or digital deformities noted.  HAV  B/L  Hammer toes  B/L.  Skin  normotropic skin noted bilaterally.  No signs of infections or ulcers noted.     Onychomycosis  Pain in right toes  Pain in left toes    Consent was obtained for treatment procedures.   Mechanical debridement of nails 1-5  bilaterally performed with a nail nipper.  Filed with dremel without incident.   RTC 10 weeks.   Return office visit    10 weeks                 Told patient to return for periodic foot care and evaluation due to potential at risk complications.   Helane Gunther DPM

## 2023-08-04 DIAGNOSIS — Z992 Dependence on renal dialysis: Secondary | ICD-10-CM | POA: Diagnosis not present

## 2023-08-04 DIAGNOSIS — N186 End stage renal disease: Secondary | ICD-10-CM | POA: Diagnosis not present

## 2023-08-04 DIAGNOSIS — N2581 Secondary hyperparathyroidism of renal origin: Secondary | ICD-10-CM | POA: Diagnosis not present

## 2023-08-07 DIAGNOSIS — N2581 Secondary hyperparathyroidism of renal origin: Secondary | ICD-10-CM | POA: Diagnosis not present

## 2023-08-07 DIAGNOSIS — Z992 Dependence on renal dialysis: Secondary | ICD-10-CM | POA: Diagnosis not present

## 2023-08-07 DIAGNOSIS — N186 End stage renal disease: Secondary | ICD-10-CM | POA: Diagnosis not present

## 2023-08-09 DIAGNOSIS — R262 Difficulty in walking, not elsewhere classified: Secondary | ICD-10-CM | POA: Diagnosis not present

## 2023-08-09 DIAGNOSIS — N186 End stage renal disease: Secondary | ICD-10-CM | POA: Diagnosis not present

## 2023-08-09 DIAGNOSIS — E43 Unspecified severe protein-calorie malnutrition: Secondary | ICD-10-CM | POA: Diagnosis not present

## 2023-08-09 DIAGNOSIS — N2581 Secondary hyperparathyroidism of renal origin: Secondary | ICD-10-CM | POA: Diagnosis not present

## 2023-08-09 DIAGNOSIS — Z992 Dependence on renal dialysis: Secondary | ICD-10-CM | POA: Diagnosis not present

## 2023-08-11 DIAGNOSIS — Z992 Dependence on renal dialysis: Secondary | ICD-10-CM | POA: Diagnosis not present

## 2023-08-11 DIAGNOSIS — N186 End stage renal disease: Secondary | ICD-10-CM | POA: Diagnosis not present

## 2023-08-11 DIAGNOSIS — N2581 Secondary hyperparathyroidism of renal origin: Secondary | ICD-10-CM | POA: Diagnosis not present

## 2023-08-13 DIAGNOSIS — N186 End stage renal disease: Secondary | ICD-10-CM | POA: Diagnosis not present

## 2023-08-13 DIAGNOSIS — Z992 Dependence on renal dialysis: Secondary | ICD-10-CM | POA: Diagnosis not present

## 2023-08-13 DIAGNOSIS — N2581 Secondary hyperparathyroidism of renal origin: Secondary | ICD-10-CM | POA: Diagnosis not present

## 2023-08-15 DIAGNOSIS — N2581 Secondary hyperparathyroidism of renal origin: Secondary | ICD-10-CM | POA: Diagnosis not present

## 2023-08-15 DIAGNOSIS — N186 End stage renal disease: Secondary | ICD-10-CM | POA: Diagnosis not present

## 2023-08-15 DIAGNOSIS — Z992 Dependence on renal dialysis: Secondary | ICD-10-CM | POA: Diagnosis not present

## 2023-08-18 DIAGNOSIS — N186 End stage renal disease: Secondary | ICD-10-CM | POA: Diagnosis not present

## 2023-08-18 DIAGNOSIS — N2581 Secondary hyperparathyroidism of renal origin: Secondary | ICD-10-CM | POA: Diagnosis not present

## 2023-08-18 DIAGNOSIS — Z992 Dependence on renal dialysis: Secondary | ICD-10-CM | POA: Diagnosis not present

## 2023-08-20 ENCOUNTER — Other Ambulatory Visit: Payer: Self-pay | Admitting: Adult Health

## 2023-08-20 DIAGNOSIS — N186 End stage renal disease: Secondary | ICD-10-CM | POA: Diagnosis not present

## 2023-08-20 DIAGNOSIS — Z992 Dependence on renal dialysis: Secondary | ICD-10-CM | POA: Diagnosis not present

## 2023-08-20 DIAGNOSIS — N2581 Secondary hyperparathyroidism of renal origin: Secondary | ICD-10-CM | POA: Diagnosis not present

## 2023-08-21 ENCOUNTER — Encounter (HOSPITAL_COMMUNITY): Payer: Self-pay

## 2023-08-22 DIAGNOSIS — N186 End stage renal disease: Secondary | ICD-10-CM | POA: Diagnosis not present

## 2023-08-22 DIAGNOSIS — N2581 Secondary hyperparathyroidism of renal origin: Secondary | ICD-10-CM | POA: Diagnosis not present

## 2023-08-22 DIAGNOSIS — E1022 Type 1 diabetes mellitus with diabetic chronic kidney disease: Secondary | ICD-10-CM | POA: Diagnosis not present

## 2023-08-22 DIAGNOSIS — Z992 Dependence on renal dialysis: Secondary | ICD-10-CM | POA: Diagnosis not present

## 2023-08-24 DIAGNOSIS — Z8546 Personal history of malignant neoplasm of prostate: Secondary | ICD-10-CM | POA: Diagnosis not present

## 2023-08-24 DIAGNOSIS — R310 Gross hematuria: Secondary | ICD-10-CM | POA: Diagnosis not present

## 2023-08-25 DIAGNOSIS — N186 End stage renal disease: Secondary | ICD-10-CM | POA: Diagnosis not present

## 2023-08-25 DIAGNOSIS — N2581 Secondary hyperparathyroidism of renal origin: Secondary | ICD-10-CM | POA: Diagnosis not present

## 2023-08-25 DIAGNOSIS — Z992 Dependence on renal dialysis: Secondary | ICD-10-CM | POA: Diagnosis not present

## 2023-08-28 ENCOUNTER — Telehealth: Payer: Self-pay

## 2023-08-28 DIAGNOSIS — N186 End stage renal disease: Secondary | ICD-10-CM | POA: Diagnosis not present

## 2023-08-28 DIAGNOSIS — Z992 Dependence on renal dialysis: Secondary | ICD-10-CM | POA: Diagnosis not present

## 2023-08-28 DIAGNOSIS — N2581 Secondary hyperparathyroidism of renal origin: Secondary | ICD-10-CM | POA: Diagnosis not present

## 2023-08-28 MED ORDER — LACTULOSE 10 GM/15ML PO SOLN: ORAL | 1 refills | Status: DC

## 2023-08-28 NOTE — Telephone Encounter (Signed)
 Copied from CRM [id number]. Topic: Clinical - Medication Refill >> Aug 28, 2023  9:42 AM Kinnie DEL wrote: Most Recent Primary Care Visit:  Provider: LUKE CHIQUITA SAUNDERS  Department: [facility]  Visit Type: MEDICARE AWV, SEQUENTIAL  Date: 02/09/2023  Medication: lactulose  (CHRONULAC ) 10 GM/15ML solution   Has the patient contacted their pharmacy? No (Agent: If no, request that the patient contact the pharmacy for the refill. If patient does not wish to contact the pharmacy document the reason why and proceed with request.) (Agent: If yes, when and what did the pharmacy advise?)  Is this the correct pharmacy for this prescription? Yes If no, delete pharmacy and type the correct one.  This is the patient's preferred pharmacy:  [id number] - [place], [place] - [address] AT [address] OF [facility] [address] [place] [address] Phone: [phone] Fax: [phone]  [facility] [address] [place] [address] Phone: [phone] Fax: [phone]   Has the prescription been filled recently? No  Is the patient out of the medication? Yes  Has the patient been seen for an appointment in the last year OR does the patient have an upcoming appointment? No  Can we respond through MyChart? No  Agent: Please be advised that Rx refills may take up to 3 business days. We ask that you follow-up with your pharmacy.

## 2023-08-28 NOTE — Addendum Note (Signed)
 Addended by: Waymon Amato R on: 08/28/2023 05:31 PM   Modules accepted: Orders

## 2023-08-28 NOTE — Telephone Encounter (Signed)
Prescription sent to pharmacy. No other action needed.  

## 2023-08-29 DIAGNOSIS — E113291 Type 2 diabetes mellitus with mild nonproliferative diabetic retinopathy without macular edema, right eye: Secondary | ICD-10-CM | POA: Diagnosis not present

## 2023-08-29 DIAGNOSIS — Z961 Presence of intraocular lens: Secondary | ICD-10-CM | POA: Diagnosis not present

## 2023-08-30 DIAGNOSIS — N186 End stage renal disease: Secondary | ICD-10-CM | POA: Diagnosis not present

## 2023-08-30 DIAGNOSIS — N2581 Secondary hyperparathyroidism of renal origin: Secondary | ICD-10-CM | POA: Diagnosis not present

## 2023-08-30 DIAGNOSIS — Z992 Dependence on renal dialysis: Secondary | ICD-10-CM | POA: Diagnosis not present

## 2023-09-01 DIAGNOSIS — N2581 Secondary hyperparathyroidism of renal origin: Secondary | ICD-10-CM | POA: Diagnosis not present

## 2023-09-01 DIAGNOSIS — N186 End stage renal disease: Secondary | ICD-10-CM | POA: Diagnosis not present

## 2023-09-01 DIAGNOSIS — Z992 Dependence on renal dialysis: Secondary | ICD-10-CM | POA: Diagnosis not present

## 2023-09-04 DIAGNOSIS — N186 End stage renal disease: Secondary | ICD-10-CM | POA: Diagnosis not present

## 2023-09-04 DIAGNOSIS — Z992 Dependence on renal dialysis: Secondary | ICD-10-CM | POA: Diagnosis not present

## 2023-09-04 DIAGNOSIS — N2581 Secondary hyperparathyroidism of renal origin: Secondary | ICD-10-CM | POA: Diagnosis not present

## 2023-09-06 DIAGNOSIS — Z992 Dependence on renal dialysis: Secondary | ICD-10-CM | POA: Diagnosis not present

## 2023-09-06 DIAGNOSIS — N2581 Secondary hyperparathyroidism of renal origin: Secondary | ICD-10-CM | POA: Diagnosis not present

## 2023-09-06 DIAGNOSIS — N186 End stage renal disease: Secondary | ICD-10-CM | POA: Diagnosis not present

## 2023-09-08 DIAGNOSIS — N2581 Secondary hyperparathyroidism of renal origin: Secondary | ICD-10-CM | POA: Diagnosis not present

## 2023-09-08 DIAGNOSIS — N186 End stage renal disease: Secondary | ICD-10-CM | POA: Diagnosis not present

## 2023-09-08 DIAGNOSIS — Z992 Dependence on renal dialysis: Secondary | ICD-10-CM | POA: Diagnosis not present

## 2023-09-09 DIAGNOSIS — R262 Difficulty in walking, not elsewhere classified: Secondary | ICD-10-CM | POA: Diagnosis not present

## 2023-09-09 DIAGNOSIS — E43 Unspecified severe protein-calorie malnutrition: Secondary | ICD-10-CM | POA: Diagnosis not present

## 2023-09-11 DIAGNOSIS — N2581 Secondary hyperparathyroidism of renal origin: Secondary | ICD-10-CM | POA: Diagnosis not present

## 2023-09-11 DIAGNOSIS — N186 End stage renal disease: Secondary | ICD-10-CM | POA: Diagnosis not present

## 2023-09-11 DIAGNOSIS — Z992 Dependence on renal dialysis: Secondary | ICD-10-CM | POA: Diagnosis not present

## 2023-09-12 DIAGNOSIS — I351 Nonrheumatic aortic (valve) insufficiency: Secondary | ICD-10-CM | POA: Diagnosis not present

## 2023-09-12 DIAGNOSIS — I350 Nonrheumatic aortic (valve) stenosis: Secondary | ICD-10-CM | POA: Diagnosis not present

## 2023-09-12 DIAGNOSIS — I5032 Chronic diastolic (congestive) heart failure: Secondary | ICD-10-CM | POA: Diagnosis not present

## 2023-09-12 DIAGNOSIS — I10 Essential (primary) hypertension: Secondary | ICD-10-CM | POA: Diagnosis not present

## 2023-09-13 DIAGNOSIS — N186 End stage renal disease: Secondary | ICD-10-CM | POA: Diagnosis not present

## 2023-09-13 DIAGNOSIS — Z992 Dependence on renal dialysis: Secondary | ICD-10-CM | POA: Diagnosis not present

## 2023-09-13 DIAGNOSIS — N2581 Secondary hyperparathyroidism of renal origin: Secondary | ICD-10-CM | POA: Diagnosis not present

## 2023-09-15 DIAGNOSIS — N186 End stage renal disease: Secondary | ICD-10-CM | POA: Diagnosis not present

## 2023-09-15 DIAGNOSIS — N2581 Secondary hyperparathyroidism of renal origin: Secondary | ICD-10-CM | POA: Diagnosis not present

## 2023-09-15 DIAGNOSIS — Z992 Dependence on renal dialysis: Secondary | ICD-10-CM | POA: Diagnosis not present

## 2023-09-18 DIAGNOSIS — N2581 Secondary hyperparathyroidism of renal origin: Secondary | ICD-10-CM | POA: Diagnosis not present

## 2023-09-18 DIAGNOSIS — N186 End stage renal disease: Secondary | ICD-10-CM | POA: Diagnosis not present

## 2023-09-18 DIAGNOSIS — Z992 Dependence on renal dialysis: Secondary | ICD-10-CM | POA: Diagnosis not present

## 2023-09-19 NOTE — Progress Notes (Unsigned)
[name] [address] [place] [address] Phone: [phone] Subjective:    I'm seeing this patient by the request  of:  [name], NP  CC: Right wrist pain  ION:[id number]  07/13/2023 Severe carpal tunnel noted on EMG.  We did discuss with patient about the possibility of surgical intervention.  Patient declined at the moment.  Discussed which activities to do and which ones to avoid.  Discussed home exercises and icing regimen.  Patient could have repeat injection but would like to hold again if necessary for now with patient not having pain.  Patient as well as his spouse agree with the plan.  Follow-up again in 10 to 12 weeks for further evaluation      Update 09/21/2023 Austin [name] is a 89 y.o. male coming in with complaint of R wrist pain. Patient states that his wrist is the same as last visit. No pain today. Feels like fingers are numb though. Injections helped to dissipate pain.        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 BLOOD LOSS 03/13/2007   CAROTID ARTERY STENOSIS 05/10/2010   CHF (congestive heart failure) (HCC)    DIABETES MELLITUS, TYPE II 09/19/2007   DISEASE, CEREBROVASCULAR NEC 03/05/2007   GERD 03/13/2007   HYPERLIPIDEMIA 03/05/2007   HYPERTENSION 03/05/2007   HYPOKALEMIA 11/09/2009   KNEE PAIN, RIGHT 11/09/2009   PROSTATE CANCER, HX OF 03/05/2007   RENAL DISEASE, CHRONIC 02/03/2009   Past Surgical History:  Procedure Laterality Date   AV FISTULA PLACEMENT Left 10/18/2022   Procedure: INSERTION OF LEFT ARM BRACHIAL ARTERY TO AXILLARY VEIN ARTERIOVENOUS (AV) GORE-TEX GRAFT;  Surgeon: Victorino Sparrow, MD;  Location: [facility] OR;  Service: Vascular;  Laterality: Left;   CAROTID ARTERY ANGIOPLASTY Right Oct. 10, 2001   ESOPHAGOGASTRODUODENOSCOPY (EGD) WITH  PROPOFOL N/A 11/04/2016   Procedure: ESOPHAGOGASTRODUODENOSCOPY (EGD) WITH PROPOFOL;  Surgeon: Kathi Der, MD;  Location: [facility];  Service: Gastroenterology;  Laterality: N/A;   IR FLUORO GUIDE CV LINE RIGHT  10/25/2022   IR FLUORO GUIDE CV LINE RIGHT  10/27/2022   IR US GUIDE VASC ACCESS RIGHT  10/25/2022   IR US GUIDE VASC ACCESS RIGHT  10/27/2022   LAPAROSCOPIC APPENDECTOMY  02/20/2012   Procedure: APPENDECTOMY LAPAROSCOPIC;  Surgeon: Almond Lint, MD;  Location: [facility] OR;  Service: General;  Laterality: N/A;   PROSTATE SURGERY     prostatectomy   Social History   Socioeconomic History   Marital status: Married    Spouse name: Not on file   Number of children: Not on file   Years of education: Not on file   Highest education level: Not on file  Occupational History   Not on file  Tobacco Use   Smoking status: Former    Current packs/day: 0.00    Average packs/day: 1 pack/day for 10.0 years (10.0 ttl pk-yrs)    Types: Cigarettes    Start date: 08/22/1964    Quit date: 08/22/1974    Years since quitting: 49.1   Smokeless tobacco: Never  Vaping Use   Vaping status: Never Used  Substance and Sexual Activity   Alcohol use: No    Alcohol/week: 0.0 standard drinks of alcohol   Drug use: No   Sexual activity: Not Currently  Other Topics Concern   Not on file  Social  History Narrative   Retired Personnel officer    Married 53 years       He enjoys traveling    Social Drivers of [employer]: Low Risk  (02/09/2023)   Overall Financial Resource Strain (CARDIA)    Difficulty of Paying Living Expenses: Not very hard  Food Insecurity: No Food Insecurity (02/09/2023)   Hunger Vital Sign    Worried About Running Out of Food in the Last Year: Never true    Ran Out of Food in the Last Year: Never true  Transportation Needs: No Transportation Needs (07/27/2022)   PRAPARE - Administrator, Civil Service (Medical): No    Lack of Transportation  (Non-Medical): No  Physical Activity: Insufficiently Active (02/09/2023)   Exercise Vital Sign    Days of Exercise per Week: 3 days    Minutes of Exercise per Session: 30 min  Stress: No Stress Concern Present (02/09/2023)   [employer] of Occupational Health - Occupational Stress Questionnaire    Feeling of Stress : Not at all  Social Connections: Moderately Integrated (02/09/2023)   Social Connection and Isolation Panel [NHANES]    Frequency of Communication with Friends and Family: Twice a week    Frequency of Social Gatherings with Friends and Family: Never    Attends Religious Services: More than 4 times per year    Active Member of [employer] or Organizations: Yes    Attends Engineer, structural: More than 4 times per year    Marital Status: Married   Allergies  Allergen Reactions   Aspirin Other (See Comments)    High doses causes stomach ulcer and bleeding   Family History  Problem Relation Age of Onset   Hypertension Mother    Cancer Father        Mesothelioma    Stomach cancer Brother    Cancer Brother    Cancer - Cervical Brother    Cancer Brother    Diabetes Brother    Esophageal cancer Neg Hx    Colon cancer Neg Hx    Pancreatic cancer Neg Hx       Current Outpatient Medications (Cardiovascular):    amLODipine (NORVASC) 10 MG tablet, Take 1 tablet (10 mg total) by mouth daily.   atorvastatin (LIPITOR) 20 MG tablet, TAKE 1 TABLET BY MOUTH EVERY DAY   cloNIDine (CATAPRES) 0.3 MG tablet, Take 1 tablet (0.3 mg total) by mouth 3 (three) times daily. (Patient taking differently: Take 0.3 mg by mouth 2 (two) times daily.)   doxazosin (CARDURA) 2 MG tablet, Take 2 mg by mouth at bedtime.    isosorbide-hydrALAZINE (BIDIL) 20-37.5 MG tablet, Take 1 tablet by mouth 3 (three) times daily.   metolazone (ZAROXOLYN) 5 MG tablet, Take 5 mg by mouth daily as needed.   metoprolol succinate (TOPROL-XL) 100 MG 24 hr tablet, Take 1 tablet (100 mg total) by mouth daily.  Take with or immediately following a meal.   torsemide (DEMADEX) 20 MG tablet, Take 40 mg by mouth 2 (two) times daily.   Current Outpatient Medications (Respiratory):    albuterol (VENTOLIN HFA) 108 (90 Base) MCG/ACT inhaler, TAKE 2 PUFFS BY MOUTH EVERY 6 HOURS AS NEEDED FOR WHEEZE OR SHORTNESS OF BREATH   guaiFENesin (MUCINEX) 600 MG 12 hr tablet, Take 600 mg by mouth at bedtime.   Current Outpatient Medications (Analgesics):    acetaminophen (TYLENOL) 500 MG tablet, Take 1,000 mg by mouth every 4 (four) hours as needed.  Current Outpatient Medications (Hematological):    clopidogrel (PLAVIX) 75 MG tablet, TAKE 1 TABLET BY MOUTH EVERY DAY   Current Outpatient Medications (Other):    Accu-Chek Softclix Lancets lancets, USED TO CHECK BLOOD GLUCOSE TWICE A DAY OR AS NEEDED   cholecalciferol (VITAMIN D3) 25 MCG (1000 UNIT) tablet, Take 1,000 Units by mouth daily.   esomeprazole (NEXIUM) 40 MG capsule, TAKE 1 CAPSULE BY MOUTH EVERY DAY   glucose blood (ACCU-CHEK GUIDE) test strip, USE TO CHECK BLOOD GLUCOSE TWICE A DAY OR AS NEEDED   hydrocortisone (ANUSOL-HC) 2.5 % rectal cream, Place 1 application rectally 2 (two) times daily.   KLOR-CON M10 10 MEQ tablet, Take by mouth.   lactulose (CHRONULAC) 10 GM/15ML solution, TAKE 30 MLS (20 G TOTAL) BY MOUTH 2 (TWO) TIMES DAILY AS NEEDED.   Melatonin 10 MG TABS, Take 10 mg by mouth at bedtime.   methylcellulose (CITRUCEL) oral powder, 1 tablespoon every 2-3 days,alternating with lactulose   neomycin-polymyxin b-dexamethasone (MAXITROL) 3.5-10000-0.1 SUSP, Place 1 drop into both eyes 4 (four) times daily.   pregabalin (LYRICA) 25 MG capsule, TAKE 1 CAPSULE BY MOUTH EVERY DAY   Propylene Glycol (SYSTANE BALANCE OP), Place 1 drop into both eyes at bedtime.   sevelamer carbonate (RENVELA) 800 MG tablet, Take 800 mg by mouth 3 (three) times daily.   traZODone (DESYREL) 50 MG tablet, Take 1 tablet (50 mg total) by mouth at bedtime.   triamcinolone  cream (KENALOG) 0.1 %, Apply 1 application topically daily as needed (dry/irritated skin).  Current Facility-Administered Medications (Other):    ammonium lactate (LAC-HYDRIN) 12 % lotion   Objective  Blood pressure (!) 116/52, pulse 62, height 5\' 7"  (1.702 m), weight 176 lb (79.8 kg), SpO2 97%.   General: No apparent distress alert and oriented x3 mood and affect normal, dressed appropriately.  HEENT: Pupils equal, extraocular movements intact  Respiratory: Patient's speak in full sentences and does not appear short of breath  Cardiovascular: No lower extremity edema, non tender, no erythema  Antalgic gait noted.  Arthritic changes of multiple joints. Right wrist does have some limited range of motion.  Positive Tinel's noted of the wrist.  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  No significant though hypoechoic changes noted.    Impression and Recommendations:     The above documentation has been reviewed and is accurate and complete Judi Saa, DO

## 2023-09-20 DIAGNOSIS — Z992 Dependence on renal dialysis: Secondary | ICD-10-CM | POA: Diagnosis not present

## 2023-09-20 DIAGNOSIS — N2581 Secondary hyperparathyroidism of renal origin: Secondary | ICD-10-CM | POA: Diagnosis not present

## 2023-09-20 DIAGNOSIS — N186 End stage renal disease: Secondary | ICD-10-CM | POA: Diagnosis not present

## 2023-09-21 ENCOUNTER — Ambulatory Visit: Payer: Medicare PPO | Admitting: Family Medicine

## 2023-09-21 ENCOUNTER — Other Ambulatory Visit: Payer: Self-pay

## 2023-09-21 ENCOUNTER — Encounter: Payer: Self-pay | Admitting: Family Medicine

## 2023-09-21 VITALS — BP 116/52 | HR 62 | Ht 67.0 in | Wt 176.0 lb

## 2023-09-21 DIAGNOSIS — M25531 Pain in right wrist: Secondary | ICD-10-CM | POA: Diagnosis not present

## 2023-09-21 DIAGNOSIS — G5601 Carpal tunnel syndrome, right upper limb: Secondary | ICD-10-CM | POA: Diagnosis not present

## 2023-09-21 DIAGNOSIS — Z961 Presence of intraocular lens: Secondary | ICD-10-CM | POA: Diagnosis not present

## 2023-09-21 NOTE — Patient Instructions (Signed)
See me again in 2 months

## 2023-09-21 NOTE — Assessment & Plan Note (Signed)
Patient seems to be doing very well.  Can repeat injections every 3 months if needed but would like to avoid it.  Patient is 89 years old and we did discuss with patient at great length about icing regimen and home exercises, discussed bracing.  Patient is no longer having any pain.  Hopeful that this will continue to do well.  Hold on any injections at this time but will consider follow-up again in 3 months.

## 2023-09-22 DIAGNOSIS — N2581 Secondary hyperparathyroidism of renal origin: Secondary | ICD-10-CM | POA: Diagnosis not present

## 2023-09-22 DIAGNOSIS — Z992 Dependence on renal dialysis: Secondary | ICD-10-CM | POA: Diagnosis not present

## 2023-09-22 DIAGNOSIS — E1022 Type 1 diabetes mellitus with diabetic chronic kidney disease: Secondary | ICD-10-CM | POA: Diagnosis not present

## 2023-09-22 DIAGNOSIS — N186 End stage renal disease: Secondary | ICD-10-CM | POA: Diagnosis not present

## 2023-09-25 DIAGNOSIS — N2581 Secondary hyperparathyroidism of renal origin: Secondary | ICD-10-CM | POA: Diagnosis not present

## 2023-09-25 DIAGNOSIS — N186 End stage renal disease: Secondary | ICD-10-CM | POA: Diagnosis not present

## 2023-09-25 DIAGNOSIS — Z992 Dependence on renal dialysis: Secondary | ICD-10-CM | POA: Diagnosis not present

## 2023-09-27 DIAGNOSIS — N2581 Secondary hyperparathyroidism of renal origin: Secondary | ICD-10-CM | POA: Diagnosis not present

## 2023-09-27 DIAGNOSIS — N186 End stage renal disease: Secondary | ICD-10-CM | POA: Diagnosis not present

## 2023-09-27 DIAGNOSIS — Z992 Dependence on renal dialysis: Secondary | ICD-10-CM | POA: Diagnosis not present

## 2023-09-29 DIAGNOSIS — N2581 Secondary hyperparathyroidism of renal origin: Secondary | ICD-10-CM | POA: Diagnosis not present

## 2023-09-29 DIAGNOSIS — N186 End stage renal disease: Secondary | ICD-10-CM | POA: Diagnosis not present

## 2023-09-29 DIAGNOSIS — Z992 Dependence on renal dialysis: Secondary | ICD-10-CM | POA: Diagnosis not present

## 2023-10-02 DIAGNOSIS — N2581 Secondary hyperparathyroidism of renal origin: Secondary | ICD-10-CM | POA: Diagnosis not present

## 2023-10-02 DIAGNOSIS — Z992 Dependence on renal dialysis: Secondary | ICD-10-CM | POA: Diagnosis not present

## 2023-10-02 DIAGNOSIS — N186 End stage renal disease: Secondary | ICD-10-CM | POA: Diagnosis not present

## 2023-10-04 DIAGNOSIS — N2581 Secondary hyperparathyroidism of renal origin: Secondary | ICD-10-CM | POA: Diagnosis not present

## 2023-10-04 DIAGNOSIS — N186 End stage renal disease: Secondary | ICD-10-CM | POA: Diagnosis not present

## 2023-10-04 DIAGNOSIS — Z992 Dependence on renal dialysis: Secondary | ICD-10-CM | POA: Diagnosis not present

## 2023-10-05 DIAGNOSIS — I739 Peripheral vascular disease, unspecified: Secondary | ICD-10-CM | POA: Diagnosis not present

## 2023-10-05 DIAGNOSIS — L84 Corns and callosities: Secondary | ICD-10-CM | POA: Diagnosis not present

## 2023-10-06 DIAGNOSIS — N2581 Secondary hyperparathyroidism of renal origin: Secondary | ICD-10-CM | POA: Diagnosis not present

## 2023-10-06 DIAGNOSIS — Z992 Dependence on renal dialysis: Secondary | ICD-10-CM | POA: Diagnosis not present

## 2023-10-06 DIAGNOSIS — N186 End stage renal disease: Secondary | ICD-10-CM | POA: Diagnosis not present

## 2023-10-09 DIAGNOSIS — N186 End stage renal disease: Secondary | ICD-10-CM | POA: Diagnosis not present

## 2023-10-09 DIAGNOSIS — N2581 Secondary hyperparathyroidism of renal origin: Secondary | ICD-10-CM | POA: Diagnosis not present

## 2023-10-09 DIAGNOSIS — Z992 Dependence on renal dialysis: Secondary | ICD-10-CM | POA: Diagnosis not present

## 2023-10-10 DIAGNOSIS — R262 Difficulty in walking, not elsewhere classified: Secondary | ICD-10-CM | POA: Diagnosis not present

## 2023-10-10 DIAGNOSIS — E43 Unspecified severe protein-calorie malnutrition: Secondary | ICD-10-CM | POA: Diagnosis not present

## 2023-10-11 DIAGNOSIS — N186 End stage renal disease: Secondary | ICD-10-CM | POA: Diagnosis not present

## 2023-10-11 DIAGNOSIS — N2581 Secondary hyperparathyroidism of renal origin: Secondary | ICD-10-CM | POA: Diagnosis not present

## 2023-10-11 DIAGNOSIS — Z992 Dependence on renal dialysis: Secondary | ICD-10-CM | POA: Diagnosis not present

## 2023-10-13 DIAGNOSIS — N186 End stage renal disease: Secondary | ICD-10-CM | POA: Diagnosis not present

## 2023-10-13 DIAGNOSIS — N2581 Secondary hyperparathyroidism of renal origin: Secondary | ICD-10-CM | POA: Diagnosis not present

## 2023-10-13 DIAGNOSIS — Z992 Dependence on renal dialysis: Secondary | ICD-10-CM | POA: Diagnosis not present

## 2023-10-15 ENCOUNTER — Other Ambulatory Visit: Payer: Self-pay | Admitting: Adult Health

## 2023-10-16 DIAGNOSIS — Z992 Dependence on renal dialysis: Secondary | ICD-10-CM | POA: Diagnosis not present

## 2023-10-16 DIAGNOSIS — N186 End stage renal disease: Secondary | ICD-10-CM | POA: Diagnosis not present

## 2023-10-16 DIAGNOSIS — N2581 Secondary hyperparathyroidism of renal origin: Secondary | ICD-10-CM | POA: Diagnosis not present

## 2023-10-18 DIAGNOSIS — Z992 Dependence on renal dialysis: Secondary | ICD-10-CM | POA: Diagnosis not present

## 2023-10-18 DIAGNOSIS — N186 End stage renal disease: Secondary | ICD-10-CM | POA: Diagnosis not present

## 2023-10-18 DIAGNOSIS — N2581 Secondary hyperparathyroidism of renal origin: Secondary | ICD-10-CM | POA: Diagnosis not present

## 2023-10-20 DIAGNOSIS — N2581 Secondary hyperparathyroidism of renal origin: Secondary | ICD-10-CM | POA: Diagnosis not present

## 2023-10-20 DIAGNOSIS — N186 End stage renal disease: Secondary | ICD-10-CM | POA: Diagnosis not present

## 2023-10-20 DIAGNOSIS — E1022 Type 1 diabetes mellitus with diabetic chronic kidney disease: Secondary | ICD-10-CM | POA: Diagnosis not present

## 2023-10-20 DIAGNOSIS — Z992 Dependence on renal dialysis: Secondary | ICD-10-CM | POA: Diagnosis not present

## 2023-10-23 DIAGNOSIS — Z992 Dependence on renal dialysis: Secondary | ICD-10-CM | POA: Diagnosis not present

## 2023-10-23 DIAGNOSIS — N2581 Secondary hyperparathyroidism of renal origin: Secondary | ICD-10-CM | POA: Diagnosis not present

## 2023-10-23 DIAGNOSIS — N186 End stage renal disease: Secondary | ICD-10-CM | POA: Diagnosis not present

## 2023-10-25 DIAGNOSIS — N2581 Secondary hyperparathyroidism of renal origin: Secondary | ICD-10-CM | POA: Diagnosis not present

## 2023-10-25 DIAGNOSIS — N186 End stage renal disease: Secondary | ICD-10-CM | POA: Diagnosis not present

## 2023-10-25 DIAGNOSIS — Z992 Dependence on renal dialysis: Secondary | ICD-10-CM | POA: Diagnosis not present

## 2023-10-27 DIAGNOSIS — N2581 Secondary hyperparathyroidism of renal origin: Secondary | ICD-10-CM | POA: Diagnosis not present

## 2023-10-27 DIAGNOSIS — N186 End stage renal disease: Secondary | ICD-10-CM | POA: Diagnosis not present

## 2023-10-27 DIAGNOSIS — Z992 Dependence on renal dialysis: Secondary | ICD-10-CM | POA: Diagnosis not present

## 2023-10-28 ENCOUNTER — Ambulatory Visit (INDEPENDENT_AMBULATORY_CARE_PROVIDER_SITE_OTHER)

## 2023-10-28 ENCOUNTER — Ambulatory Visit (HOSPITAL_COMMUNITY)
Admission: EM | Admit: 2023-10-28 | Discharge: 2023-10-28 | Disposition: A | Discharge status: Home / Self Care | Attending: Physician Assistant | Admitting: Physician Assistant

## 2023-10-28 ENCOUNTER — Encounter (HOSPITAL_COMMUNITY): Payer: Self-pay

## 2023-10-28 DIAGNOSIS — J069 Acute upper respiratory infection, unspecified: Secondary | ICD-10-CM | POA: Diagnosis not present

## 2023-10-28 DIAGNOSIS — R051 Acute cough: Secondary | ICD-10-CM | POA: Diagnosis not present

## 2023-10-28 DIAGNOSIS — I517 Cardiomegaly: Secondary | ICD-10-CM | POA: Diagnosis not present

## 2023-10-28 DIAGNOSIS — R062 Wheezing: Secondary | ICD-10-CM | POA: Diagnosis not present

## 2023-10-28 DIAGNOSIS — J984 Other disorders of lung: Secondary | ICD-10-CM | POA: Diagnosis not present

## 2023-10-28 DIAGNOSIS — R059 Cough, unspecified: Secondary | ICD-10-CM | POA: Diagnosis not present

## 2023-10-28 MED ORDER — PREDNISONE 10 MG PO TABS: 40.0000 mg | ORAL_TABLET | Freq: Every day | ORAL | 0 refills | Status: AC

## 2023-10-28 MED ORDER — BENZONATATE 100 MG PO CAPS: 100.0000 mg | ORAL_CAPSULE | Freq: Three times a day (TID) | ORAL | 0 refills | Status: DC

## 2023-10-28 MED ORDER — AZITHROMYCIN 250 MG PO TABS: ORAL_TABLET | ORAL | 0 refills | Status: DC

## 2023-10-28 NOTE — ED Triage Notes (Signed)
 Patient c/o a non productive cough and nasal congestion x 1 week.   Patient states he has not taken any medications for his symptoms.

## 2023-10-28 NOTE — Discharge Instructions (Signed)
 Take antibiotic as prescribed Can take Tessalon as needed for cough. Use albuterol inhaler as needed for wheezing or shortness of breath. Can take prednisone once daily for 5 days. Continue with Mucinex. Drink plenty of fluids, follow-up with pulmonologist if no improvement.

## 2023-10-28 NOTE — ED Provider Notes (Signed)
 [facility]    CSN: [id number] Arrival date & time: 10/28/23  1002      History   Chief Complaint Chief Complaint  Patient presents with   Cough   Nasal Congestion    HPI Austin [name] is a 89 y.o. male.   Pt complains of congestion and nonproductive cough that started about 1 week ago.  Denies fever, wheezing, shortness of breath..  He has been taking nothing for his sx.  Has a h/o chronic cough followed by pulmonology.  He does have an albuterol rescue inhaler, reports he has not had to use it, does not need a refill.  Diabetes well-controlled.  Reports glucose typically under 130.    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 BLOOD LOSS 03/13/2007   CAROTID ARTERY STENOSIS 05/10/2010   CHF (congestive heart failure) (HCC)    DIABETES MELLITUS, TYPE II 09/19/2007   DISEASE, CEREBROVASCULAR NEC 03/05/2007   GERD 03/13/2007   HYPERLIPIDEMIA 03/05/2007   HYPERTENSION 03/05/2007   HYPOKALEMIA 11/09/2009   KNEE PAIN, RIGHT 11/09/2009   PROSTATE CANCER, HX OF 03/05/2007   RENAL DISEASE, CHRONIC 02/03/2009    Patient Active Problem List   Diagnosis Date Noted   Preoperative evaluation of a medical condition to rule out surgical contraindications (TAR required) 05/04/2023   Right carpal tunnel syndrome 03/16/2023   ESRD on dialysis (HCC) 10/29/2022   Protein-calorie malnutrition, severe 10/28/2022   Pneumonia due to COVID-19 virus 10/25/2022   CKD (chronic kidney disease) stage 5, GFR less than 15 ml/min (HCC) 10/25/2022   Severe pulmonary hypertension (HCC) 10/25/2022   Moderate aortic regurgitation 10/25/2022   CAD in native artery 10/25/2022   UTI (urinary tract infection) 09/27/2021   CHF exacerbation (HCC) 09/26/2021   Acute respiratory failure with hypoxia (HCC) 09/26/2021   Acute on chronic  diastolic CHF (congestive heart failure) (HCC) 07/09/2021   Dyspnea 06/01/2021   Upper airway cough syndrome 04/06/2021   Porokeratosis 03/26/2021   Pulmonary nodules 10/22/2020   Acute on chronic renal failure (HCC) 10/09/2020   Mass of right lung 09/18/2020   Acute kidney injury superimposed on CKD (HCC) 08/04/2019   Elevated troponin 08/04/2019   Hypokalemia 08/04/2019   Hypertensive urgency 03/28/2019   Elevated serum immunoglobulin free light chains 02/20/2019   Gastrointestinal hemorrhage with melena    Melena 11/03/2016   Carotid artery stenosis 05/10/2010   Type 2 diabetes mellitus with hyperlipidemia (HCC) 09/19/2007   Iron deficiency anemia due to chronic blood loss 03/13/2007   GERD 03/13/2007   Essential hypertension 03/05/2007   DISEASE, CEREBROVASCULAR NEC 03/05/2007   PROSTATE CANCER, HX OF 03/05/2007    Past Surgical History:  Procedure Laterality Date   AV FISTULA PLACEMENT Left 10/18/2022   Procedure: INSERTION OF LEFT ARM BRACHIAL ARTERY TO AXILLARY VEIN ARTERIOVENOUS (AV) GORE-TEX GRAFT;  Surgeon: Victorino Sparrow, MD;  Location: [facility] OR;  Service: Vascular;  Laterality: Left;   CAROTID ARTERY ANGIOPLASTY Right Oct. 10, 2001   ESOPHAGOGASTRODUODENOSCOPY (EGD) WITH PROPOFOL N/A 11/04/2016   Procedure: ESOPHAGOGASTRODUODENOSCOPY (EGD) WITH PROPOFOL;  Surgeon: Kathi Der, MD;  Location: [facility];  Service: Gastroenterology;  Laterality: N/A;   IR FLUORO GUIDE CV LINE RIGHT  10/25/2022   IR FLUORO GUIDE CV LINE RIGHT  10/27/2022   IR US GUIDE VASC ACCESS RIGHT  10/25/2022   IR US GUIDE VASC ACCESS  RIGHT  10/27/2022   LAPAROSCOPIC APPENDECTOMY  02/20/2012   Procedure: APPENDECTOMY LAPAROSCOPIC;  Surgeon: Almond Lint, MD;  Location: [facility] OR;  Service: General;  Laterality: N/A;   PROSTATE SURGERY     prostatectomy       Home Medications    Prior to Admission medications   Medication Sig Start Date End Date Taking? Authorizing Provider  azithromycin (ZITHROMAX  Z-PAK) 250 MG tablet Take 2 tablets by mouth on the 1st day and then 1 tablet by mouth the following days. 10/28/23  Yes Ward, Tylene Fantasia, PA-C  benzonatate (TESSALON) 100 MG capsule Take 1 capsule (100 mg total) by mouth every 8 (eight) hours. 10/28/23  Yes Ward, Tylene Fantasia, PA-C  predniSONE (DELTASONE) 10 MG tablet Take 4 tablets (40 mg total) by mouth daily for 5 days. 10/28/23 11/02/23 Yes Ward, Tylene Fantasia, PA-C  Accu-Chek Softclix Lancets lancets USED TO CHECK BLOOD GLUCOSE TWICE A DAY OR AS NEEDED 06/08/23   Nafziger, Kandee Keen, NP  acetaminophen (TYLENOL) 500 MG tablet Take 1,000 mg by mouth every 4 (four) hours as needed.    [provider]  albuterol (VENTOLIN HFA) 108 (90 Base) MCG/ACT inhaler TAKE 2 PUFFS BY MOUTH EVERY 6 HOURS AS NEEDED FOR WHEEZE OR SHORTNESS OF BREATH 09/13/22   Leslye Peer, MD  amLODipine (NORVASC) 10 MG tablet Take 1 tablet (10 mg total) by mouth daily. 12/27/22   Nafziger, Kandee Keen, NP  atorvastatin (LIPITOR) 20 MG tablet TAKE 1 TABLET BY MOUTH EVERY DAY 04/11/23   Nafziger, Kandee Keen, NP  cholecalciferol (VITAMIN D3) 25 MCG (1000 UNIT) tablet Take 1,000 Units by mouth daily.    [provider]  cloNIDine (CATAPRES) 0.3 MG tablet Take 1 tablet (0.3 mg total) by mouth 3 (three) times daily. Patient taking differently: Take 0.3 mg by mouth 2 (two) times daily. 09/20/20   Jae Dire, MD  clopidogrel (PLAVIX) 75 MG tablet TAKE 1 TABLET BY MOUTH EVERY DAY 07/18/23   Nafziger, Kandee Keen, NP  doxazosin (CARDURA) 2 MG tablet Take 2 mg by mouth at bedtime.  12/28/17   [provider]  esomeprazole (NEXIUM) 40 MG capsule TAKE 1 CAPSULE BY MOUTH EVERY DAY 04/11/23   Nafziger, Kandee Keen, NP  glucose blood (ACCU-CHEK GUIDE) test strip USE TO CHECK BLOOD GLUCOSE TWICE A DAY OR AS NEEDED 04/27/23   Nafziger, Kandee Keen, NP  guaiFENesin (MUCINEX) 600 MG 12 hr tablet Take 600 mg by mouth at bedtime.    [provider]  hydrocortisone (ANUSOL-HC) 2.5 % rectal cream Place 1 application  rectally 2 (two) times daily. 10/12/21   Nafziger, Kandee Keen, NP  isosorbide-hydrALAZINE (BIDIL) 20-37.5 MG tablet Take 1 tablet by mouth 3 (three) times daily.    [provider]  KLOR-CON M10 10 MEQ tablet Take by mouth. 11/16/22   [provider]  lactulose (CHRONULAC) 10 GM/15ML solution TAKE 30 MLS (20 G TOTAL) BY MOUTH 2 (TWO) TIMES DAILY AS NEEDED. 10/18/23   Nafziger, Kandee Keen, NP  Melatonin 10 MG TABS Take 10 mg by mouth at bedtime.    [provider]  methylcellulose (CITRUCEL) oral powder 1 tablespoon every 2-3 days,alternating with lactulose    [provider]  metolazone (ZAROXOLYN) 5 MG tablet Take 5 mg by mouth daily as needed. 12/01/22   [provider]  metoprolol succinate (TOPROL-XL) 100 MG 24 hr tablet Take 1 tablet (100 mg total) by mouth daily. Take with or immediately following a meal. 09/21/20 02/09/23  Jae Dire, MD  neomycin-polymyxin b-dexamethasone (  MAXITROL) 3.5-10000-0.1 SUSP Place 1 drop into both eyes 4 (four) times daily. 10/13/22   [provider]  pregabalin (LYRICA) 25 MG capsule TAKE 1 CAPSULE BY MOUTH EVERY DAY 05/09/23   Nafziger, Kandee Keen, NP  Propylene Glycol (SYSTANE BALANCE OP) Place 1 drop into both eyes at bedtime.    [provider]  sevelamer carbonate (RENVELA) 800 MG tablet Take 800 mg by mouth 3 (three) times daily. 12/08/22   [provider]  torsemide (DEMADEX) 20 MG tablet Take 40 mg by mouth 2 (two) times daily. 12/10/22   [provider]  traZODone (DESYREL) 50 MG tablet Take 1 tablet (50 mg total) by mouth at bedtime. 11/05/22   Leatha Gilding, MD  triamcinolone cream (KENALOG) 0.1 % Apply 1 application topically daily as needed (dry/irritated skin). 07/05/19   [provider]    Family History Family History  Problem Relation Age of Onset   Hypertension Mother    Cancer Father        Mesothelioma    Stomach cancer Brother    Cancer Brother    Cancer - Cervical  Brother    Cancer Brother    Diabetes Brother    Esophageal cancer Neg Hx    Colon cancer Neg Hx    Pancreatic cancer Neg Hx     Social History Social History   Tobacco Use   Smoking status: Former    Current packs/day: 0.00    Average packs/day: 1 pack/day for 10.0 years (10.0 ttl pk-yrs)    Types: Cigarettes    Start date: 08/22/1964    Quit date: 08/22/1974    Years since quitting: 49.2   Smokeless tobacco: Never  Vaping Use   Vaping status: Never Used  Substance Use Topics   Alcohol use: No    Alcohol/week: 0.0 standard drinks of alcohol   Drug use: No     Allergies   Aspirin   Review of Systems Review of Systems  Constitutional:  Negative for chills and fever.  HENT:  Positive for congestion. Negative for ear pain and sore throat.   Eyes:  Negative for pain and visual disturbance.  Respiratory:  Positive for cough. Negative for shortness of breath.   Cardiovascular:  Negative for chest pain and palpitations.  Gastrointestinal:  Negative for abdominal pain and vomiting.  Genitourinary:  Negative for dysuria and hematuria.  Musculoskeletal:  Negative for arthralgias and back pain.  Skin:  Negative for color change and rash.  Neurological:  Negative for seizures and syncope.  All other systems reviewed and are negative.    Physical Exam Triage Vital Signs ED Triage Vitals [10/28/23 1013]  Encounter Vitals Group     BP (!) 135/55     Systolic BP Percentile      Diastolic BP Percentile      Pulse Rate 68     Resp 16     Temp 98.9 F (37.2 C)     Temp Source Oral     SpO2 94 %     Weight      Height      Head Circumference      Peak Flow      Pain Score 0     Pain Loc      Pain Education      Exclude from Growth Chart    No data found.  Updated Vital Signs BP (!) 135/55 (BP Location: Right Arm)   Pulse 68   Temp 98.9 F (37.2 C) (  Oral)   Resp 16   SpO2 94%   Visual Acuity Right Eye Distance:   Left Eye Distance:   Bilateral Distance:     Right Eye Near:   Left Eye Near:    Bilateral Near:     Physical Exam Vitals and nursing note reviewed.  Constitutional:      General: He is not in acute distress.    Appearance: He is well-developed.  HENT:     Head: Normocephalic and atraumatic.  Eyes:     Conjunctiva/sclera: Conjunctivae normal.  Cardiovascular:     Rate and Rhythm: Normal rate and regular rhythm.     Heart sounds: No murmur heard. Pulmonary:     Effort: Pulmonary effort is normal. No respiratory distress.     Breath sounds: Examination of the right-upper field reveals rhonchi. Examination of the left-upper field reveals rhonchi. Examination of the right-middle field reveals rhonchi. Examination of the left-middle field reveals rhonchi. Rhonchi present.  Abdominal:     Palpations: Abdomen is soft.     Tenderness: There is no abdominal tenderness.  Musculoskeletal:        General: No swelling.     Cervical back: Neck supple.  Skin:    General: Skin is warm and dry.     Capillary Refill: Capillary refill takes less than 2 seconds.  Neurological:     Mental Status: He is alert.  Psychiatric:        Mood and Affect: Mood normal.      UC Treatments / Results  Labs (all labs ordered are listed, but only abnormal results are displayed) Labs Reviewed - No data to display  EKG   Radiology DG Chest 2 View Result Date: 10/28/2023 CLINICAL DATA:  Wheezing and cough. EXAM: CHEST - 2 VIEW COMPARISON:  10/25/2022.  Chest CT 03/28/2023 FINDINGS: Focal density identified right upper lobe, new in the interval. Left lung clear. The cardio pericardial silhouette is enlarged. Interstitial markings are diffusely coarsened with chronic features. No acute bony abnormality. IMPRESSION: Focal density right upper lobe, likely corresponding to irregular 15 x 10 mm nodule seen on previous CT of 03/28/2023. The elongated appearance on today's study may be related to post treatment change or nodule progression. Cardiomegaly.  Electronically Signed   By: Kennith Center M.D.   On: 10/28/2023 10:48    Procedures Procedures (including critical care time)  Medications Ordered in UC Medications - No data to display  Initial Impression / Assessment and Plan / UC Course  I have reviewed the triage vital signs and the nursing notes.  Pertinent labs & imaging results that were available during my care of the patient were reviewed by me and considered in my medical decision making (see chart for details).     URI/bronchitis.  Will send in antibiotic and Tessalon.  Supportive care discussed.  Will send in prednisone patient is unsure if he wants to started he.  Diabetes is well-controlled.  Supportive care discussed.  Return precautions discussed. Chest x-ray with elongated appearance of chronic right upper lobe nodule, per results could be related to posttreatment change or nodule progression.  Advised patient to follow-up with pulmonologist. Final Clinical Impressions(s) / UC Diagnoses   Final diagnoses:  Acute cough  Acute upper respiratory infection     Discharge Instructions      Take antibiotic as prescribed Can take Tessalon as needed for cough. Use albuterol inhaler as needed for wheezing or shortness of breath. Can take prednisone once daily for 5 days.  Continue with Mucinex. Drink plenty of fluids, follow-up with pulmonologist if no improvement.     ED Prescriptions     Medication Sig Dispense Auth. Provider   azithromycin (ZITHROMAX Z-PAK) 250 MG tablet Take 2 tablets by mouth on the 1st day and then 1 tablet by mouth the following days. 6 each Ward, Tylene Fantasia, PA-C   predniSONE (DELTASONE) 10 MG tablet Take 4 tablets (40 mg total) by mouth daily for 5 days. 20 tablet Ward, Shanda Bumps Z, PA-C   benzonatate (TESSALON) 100 MG capsule Take 1 capsule (100 mg total) by mouth every 8 (eight) hours. 21 capsule Ward, Tylene Fantasia, PA-C      PDMP not reviewed this encounter.   Ward, Tylene Fantasia,  PA-C 10/28/23 1101

## 2023-10-30 DIAGNOSIS — N2581 Secondary hyperparathyroidism of renal origin: Secondary | ICD-10-CM | POA: Diagnosis not present

## 2023-10-30 DIAGNOSIS — Z992 Dependence on renal dialysis: Secondary | ICD-10-CM | POA: Diagnosis not present

## 2023-10-30 DIAGNOSIS — N186 End stage renal disease: Secondary | ICD-10-CM | POA: Diagnosis not present

## 2023-11-01 DIAGNOSIS — N186 End stage renal disease: Secondary | ICD-10-CM | POA: Diagnosis not present

## 2023-11-01 DIAGNOSIS — Z992 Dependence on renal dialysis: Secondary | ICD-10-CM | POA: Diagnosis not present

## 2023-11-01 DIAGNOSIS — N2581 Secondary hyperparathyroidism of renal origin: Secondary | ICD-10-CM | POA: Diagnosis not present

## 2023-11-02 ENCOUNTER — Ambulatory Visit: Payer: Medicare PPO | Admitting: Podiatry

## 2023-11-03 DIAGNOSIS — Z992 Dependence on renal dialysis: Secondary | ICD-10-CM | POA: Diagnosis not present

## 2023-11-03 DIAGNOSIS — N186 End stage renal disease: Secondary | ICD-10-CM | POA: Diagnosis not present

## 2023-11-03 DIAGNOSIS — N2581 Secondary hyperparathyroidism of renal origin: Secondary | ICD-10-CM | POA: Diagnosis not present

## 2023-11-06 DIAGNOSIS — N2581 Secondary hyperparathyroidism of renal origin: Secondary | ICD-10-CM | POA: Diagnosis not present

## 2023-11-06 DIAGNOSIS — N186 End stage renal disease: Secondary | ICD-10-CM | POA: Diagnosis not present

## 2023-11-06 DIAGNOSIS — Z992 Dependence on renal dialysis: Secondary | ICD-10-CM | POA: Diagnosis not present

## 2023-11-07 ENCOUNTER — Other Ambulatory Visit: Payer: Self-pay

## 2023-11-07 DIAGNOSIS — E43 Unspecified severe protein-calorie malnutrition: Secondary | ICD-10-CM | POA: Diagnosis not present

## 2023-11-07 DIAGNOSIS — I6521 Occlusion and stenosis of right carotid artery: Secondary | ICD-10-CM

## 2023-11-07 DIAGNOSIS — R262 Difficulty in walking, not elsewhere classified: Secondary | ICD-10-CM | POA: Diagnosis not present

## 2023-11-08 DIAGNOSIS — N2581 Secondary hyperparathyroidism of renal origin: Secondary | ICD-10-CM | POA: Diagnosis not present

## 2023-11-08 DIAGNOSIS — N186 End stage renal disease: Secondary | ICD-10-CM | POA: Diagnosis not present

## 2023-11-08 DIAGNOSIS — Z992 Dependence on renal dialysis: Secondary | ICD-10-CM | POA: Diagnosis not present

## 2023-11-10 DIAGNOSIS — Z992 Dependence on renal dialysis: Secondary | ICD-10-CM | POA: Diagnosis not present

## 2023-11-10 DIAGNOSIS — N186 End stage renal disease: Secondary | ICD-10-CM | POA: Diagnosis not present

## 2023-11-10 DIAGNOSIS — N2581 Secondary hyperparathyroidism of renal origin: Secondary | ICD-10-CM | POA: Diagnosis not present

## 2023-11-13 DIAGNOSIS — N186 End stage renal disease: Secondary | ICD-10-CM | POA: Diagnosis not present

## 2023-11-13 DIAGNOSIS — Z992 Dependence on renal dialysis: Secondary | ICD-10-CM | POA: Diagnosis not present

## 2023-11-13 DIAGNOSIS — N2581 Secondary hyperparathyroidism of renal origin: Secondary | ICD-10-CM | POA: Diagnosis not present

## 2023-11-15 DIAGNOSIS — Z992 Dependence on renal dialysis: Secondary | ICD-10-CM | POA: Diagnosis not present

## 2023-11-15 DIAGNOSIS — N2581 Secondary hyperparathyroidism of renal origin: Secondary | ICD-10-CM | POA: Diagnosis not present

## 2023-11-15 DIAGNOSIS — N186 End stage renal disease: Secondary | ICD-10-CM | POA: Diagnosis not present

## 2023-11-16 ENCOUNTER — Ambulatory Visit (INDEPENDENT_AMBULATORY_CARE_PROVIDER_SITE_OTHER): Payer: Medicare PPO | Admitting: Physician Assistant

## 2023-11-16 ENCOUNTER — Ambulatory Visit (HOSPITAL_COMMUNITY)
Admission: RE | Admit: 2023-11-16 | Discharge: 2023-11-16 | Disposition: A | Discharge status: Home / Self Care | Payer: Medicare PPO | Source: Ambulatory Visit | Attending: Vascular Surgery | Admitting: Vascular Surgery

## 2023-11-16 VITALS — BP 158/67 | HR 61 | Temp 97.2°F | Ht 67.0 in | Wt 175.0 lb

## 2023-11-16 DIAGNOSIS — I6521 Occlusion and stenosis of right carotid artery: Secondary | ICD-10-CM | POA: Diagnosis not present

## 2023-11-16 NOTE — Progress Notes (Signed)
 Office Note   History of Present Illness   Austin [name] is a 89 y.o. ([birth year]) male who presents for surveillance of carotid artery stenosis.  He has a history of right carotid intracranial angioplasty in October 2001 by Dr. Corliss Skains.   He returns today for follow-up.  He says that he has been doing fairly well since his last follow-up.  He was hospitalized in 2024 for COVID but he is now completely recovered from it.  He denies any recent neurological symptoms such as slurred speech, facial droop, weakness/numbness, or amaurosis fugax.  Current Outpatient Medications  Medication Sig Dispense Refill   Accu-Chek Softclix Lancets lancets USED TO CHECK BLOOD GLUCOSE TWICE A DAY OR AS NEEDED 100 each 6   acetaminophen (TYLENOL) 500 MG tablet Take 1,000 mg by mouth every 4 (four) hours as needed.     albuterol (VENTOLIN HFA) 108 (90 Base) MCG/ACT inhaler TAKE 2 PUFFS BY MOUTH EVERY 6 HOURS AS NEEDED FOR WHEEZE OR SHORTNESS OF BREATH 8.5 each 5   amLODipine (NORVASC) 10 MG tablet Take 1 tablet (10 mg total) by mouth daily. 90 tablet 3   atorvastatin (LIPITOR) 20 MG tablet TAKE 1 TABLET BY MOUTH EVERY DAY 90 tablet 3   azithromycin (ZITHROMAX Z-PAK) 250 MG tablet Take 2 tablets by mouth on the 1st day and then 1 tablet by mouth the following days. 6 each 0   benzonatate (TESSALON) 100 MG capsule Take 1 capsule (100 mg total) by mouth every 8 (eight) hours. 21 capsule 0   cholecalciferol (VITAMIN D3) 25 MCG (1000 UNIT) tablet Take 1,000 Units by mouth daily.     cloNIDine (CATAPRES) 0.3 MG tablet Take 1 tablet (0.3 mg total) by mouth 3 (three) times daily. (Patient taking differently: Take 0.3 mg by mouth 2 (two) times daily.) 60 tablet 0   clopidogrel (PLAVIX) 75 MG tablet TAKE 1 TABLET BY MOUTH EVERY DAY 90 tablet 3   doxazosin (CARDURA) 2 MG tablet Take 2 mg by mouth at bedtime.   3   esomeprazole (NEXIUM) 40 MG capsule TAKE 1 CAPSULE BY MOUTH EVERY DAY 90 capsule 3   glucose blood  (ACCU-CHEK GUIDE) test strip USE TO CHECK BLOOD GLUCOSE TWICE A DAY OR AS NEEDED 300 strip 1   guaiFENesin (MUCINEX) 600 MG 12 hr tablet Take 600 mg by mouth at bedtime.     hydrocortisone (ANUSOL-HC) 2.5 % rectal cream Place 1 application rectally 2 (two) times daily. 28 g 3   isosorbide-hydrALAZINE (BIDIL) 20-37.5 MG tablet Take 1 tablet by mouth 3 (three) times daily.     KLOR-CON M10 10 MEQ tablet Take by mouth.     lactulose (CHRONULAC) 10 GM/15ML solution TAKE 30 MLS (20 G TOTAL) BY MOUTH 2 (TWO) TIMES DAILY AS NEEDED. 473 mL 1   Melatonin 10 MG TABS Take 10 mg by mouth at bedtime.     methylcellulose (CITRUCEL) oral powder 1 tablespoon every 2-3 days,alternating with lactulose     metolazone (ZAROXOLYN) 5 MG tablet Take 5 mg by mouth daily as needed.     metoprolol succinate (TOPROL-XL) 100 MG 24 hr tablet Take 1 tablet (100 mg total) by mouth daily. Take with or immediately following a meal. 30 tablet 0   neomycin-polymyxin b-dexamethasone (MAXITROL) 3.5-10000-0.1 SUSP Place 1 drop into both eyes 4 (four) times daily.     pregabalin (LYRICA) 25 MG capsule TAKE 1 CAPSULE BY MOUTH EVERY DAY 30 capsule 2   Propylene Glycol (SYSTANE BALANCE OP) Place  1 drop into both eyes at bedtime.     sevelamer carbonate (RENVELA) 800 MG tablet Take 800 mg by mouth 3 (three) times daily.     torsemide (DEMADEX) 20 MG tablet Take 40 mg by mouth 2 (two) times daily.     traZODone (DESYREL) 50 MG tablet Take 1 tablet (50 mg total) by mouth at bedtime.     triamcinolone cream (KENALOG) 0.1 % Apply 1 application topically daily as needed (dry/irritated skin).     Current Facility-Administered Medications  Medication Dose Route Frequency Provider Last Rate Last Admin   ammonium lactate (LAC-HYDRIN) 12 % lotion   Topical PRN Candelaria Stagers, DPM        REVIEW OF SYSTEMS (negative unless checked):   Cardiac:  []  Chest pain or chest pressure? []  Shortness of breath upon activity? []  Shortness of breath when  lying flat? []  Irregular heart rhythm?  Vascular:  []  Pain in calf, thigh, or hip brought on by walking? []  Pain in feet at night that wakes you up from your sleep? []  Blood clot in your veins? []  Leg swelling?  Pulmonary:  []  Oxygen at home? []  Productive cough? []  Wheezing?  Neurologic:  []  Sudden weakness in arms or legs? []  Sudden numbness in arms or legs? []  Sudden onset of difficult speaking or slurred speech? []  Temporary loss of vision in one eye? []  Problems with dizziness?  Gastrointestinal:  []  Blood in stool? []  Vomited blood?  Genitourinary:  []  Burning when urinating? []  Blood in urine?  Psychiatric:  []  Major depression  Hematologic:  []  Bleeding problems? []  Problems with blood clotting?  Dermatologic:  []  Rashes or ulcers?  Constitutional:  []  Fever or chills?  Ear/Nose/Throat:  []  Change in hearing? []  Nose bleeds? []  Sore throat?  Musculoskeletal:  []  Back pain? []  Joint pain? []  Muscle pain?   Physical Examination   Vitals:   11/16/23 1243  BP: (!) 158/67  Pulse: 61  Temp: (!) 97.2 F (36.2 C)  TempSrc: Temporal  SpO2: 97%  Weight: 175 lb (79.4 kg)  Height: 5\' 7"  (1.702 m)   Body mass index is 27.41 kg/m.  General:  WDWN in NAD; vital signs documented above Gait: Not observed HENT: WNL, normocephalic Pulmonary: normal non-labored breathing , without rales, rhonchi,  wheezing Cardiac: regular Abdomen: soft, NT, no masses Skin: without rashes Vascular Exam/Pulses: palpable radial pulses bilaterally Extremities: without ischemic changes, without gangrene , without cellulitis; without open wounds;  Musculoskeletal: no muscle wasting or atrophy  Neurologic: A&O X 3;  No focal weakness or paresthesias are detected Psychiatric:  The pt has Normal affect.  Non-Invasive Vascular Imaging   Bilateral Carotid Duplex (11/16/2023):  R ICA stenosis:  1-39% R VA:  patent and antegrade L ICA stenosis:  1-39% L VA:  patent and  antegrade   Medical Decision Making   Austin [name] is a 89 y.o. male who presents for surveillance of carotid artery stenosis  Based on the patient's vascular studies, his carotid artery stenosis is unchanged at 1 to 39% bilaterally He denies any strokelike symptoms such as slurred speech, facial droop, sudden visual changes, or sudden weakness/numbness.  He has no neurological deficits on exam.  He has palpable and equal radial pulses bilaterally He can follow-up with our office in 2 years with repeat carotid duplex   Loel Dubonnet PA-C [facility] Office: [phone]  Clinic MD: Karin Lieu

## 2023-11-17 DIAGNOSIS — N2581 Secondary hyperparathyroidism of renal origin: Secondary | ICD-10-CM | POA: Diagnosis not present

## 2023-11-17 DIAGNOSIS — Z992 Dependence on renal dialysis: Secondary | ICD-10-CM | POA: Diagnosis not present

## 2023-11-17 DIAGNOSIS — N186 End stage renal disease: Secondary | ICD-10-CM | POA: Diagnosis not present

## 2023-11-20 DIAGNOSIS — E1022 Type 1 diabetes mellitus with diabetic chronic kidney disease: Secondary | ICD-10-CM | POA: Diagnosis not present

## 2023-11-20 DIAGNOSIS — Z992 Dependence on renal dialysis: Secondary | ICD-10-CM | POA: Diagnosis not present

## 2023-11-20 DIAGNOSIS — N186 End stage renal disease: Secondary | ICD-10-CM | POA: Diagnosis not present

## 2023-11-20 DIAGNOSIS — N2581 Secondary hyperparathyroidism of renal origin: Secondary | ICD-10-CM | POA: Diagnosis not present

## 2023-11-22 DIAGNOSIS — Z992 Dependence on renal dialysis: Secondary | ICD-10-CM | POA: Diagnosis not present

## 2023-11-22 DIAGNOSIS — N2581 Secondary hyperparathyroidism of renal origin: Secondary | ICD-10-CM | POA: Diagnosis not present

## 2023-11-22 DIAGNOSIS — N186 End stage renal disease: Secondary | ICD-10-CM | POA: Diagnosis not present

## 2023-11-22 NOTE — Progress Notes (Unsigned)
 [name] [address] [place] [address] Phone: [phone] Subjective:   Austin [name], am serving as a scribe for Dr. Antoine Primas.  I'm seeing this patient by the request  of:  [name], NP  CC: Right wrist pain  [id number]  09/21/2023 Patient seems to be doing very well.  Can repeat injections every 3 months if needed but would like to avoid it.  Patient is 89 years old and we did discuss with patient at great length about icing regimen and home exercises, discussed bracing.  Patient is no longer having any pain.  Hopeful that this will continue to do well.  Hold on any injections at this time but will consider follow-up again in 3 months.      Update 11/23/2023 Austin [name] is a 89 y.o. male coming in with complaint of R wrist pain. Patient states that he has been doing well. Only had pain once since last visit.       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 BLOOD LOSS 03/13/2007   CAROTID ARTERY STENOSIS 05/10/2010   CHF (congestive heart failure) (HCC)    DIABETES MELLITUS, TYPE II 09/19/2007   DISEASE, CEREBROVASCULAR NEC 03/05/2007   GERD 03/13/2007   HYPERLIPIDEMIA 03/05/2007   HYPERTENSION 03/05/2007   HYPOKALEMIA 11/09/2009   KNEE PAIN, RIGHT 11/09/2009   PROSTATE CANCER, HX OF 03/05/2007   RENAL DISEASE, CHRONIC 02/03/2009   Past Surgical History:  Procedure Laterality Date   AV FISTULA PLACEMENT Left 10/18/2022   Procedure: INSERTION OF LEFT ARM BRACHIAL ARTERY TO AXILLARY VEIN ARTERIOVENOUS (AV) GORE-TEX GRAFT;  Surgeon: Victorino Sparrow, MD;  Location: [facility] OR;  Service: Vascular;  Laterality: Left;   CAROTID ARTERY ANGIOPLASTY Right Oct. 10, 2001   ESOPHAGOGASTRODUODENOSCOPY (EGD) WITH PROPOFOL N/A 11/04/2016   Procedure: ESOPHAGOGASTRODUODENOSCOPY (EGD)  WITH PROPOFOL;  Surgeon: Kathi Der, MD;  Location: [facility];  Service: Gastroenterology;  Laterality: N/A;   IR FLUORO GUIDE CV LINE RIGHT  10/25/2022   IR FLUORO GUIDE CV LINE RIGHT  10/27/2022   IR US GUIDE VASC ACCESS RIGHT  10/25/2022   IR US GUIDE VASC ACCESS RIGHT  10/27/2022   LAPAROSCOPIC APPENDECTOMY  02/20/2012   Procedure: APPENDECTOMY LAPAROSCOPIC;  Surgeon: Almond Lint, MD;  Location: [facility] OR;  Service: General;  Laterality: N/A;   PROSTATE SURGERY     prostatectomy   Social History   Socioeconomic History   Marital status: Married    Spouse name: Not on file   Number of children: Not on file   Years of education: Not on file   Highest education level: Not on file  Occupational History   Not on file  Tobacco Use   Smoking status: Former    Current packs/day: 0.00    Average packs/day: 1 pack/day for 10.0 years (10.0 ttl pk-yrs)    Types: Cigarettes    Start date: 08/22/1964    Quit date: 08/22/1974    Years since quitting: 49.2   Smokeless tobacco: Never  Vaping Use   Vaping status: Never Used  Substance and Sexual Activity   Alcohol use: No    Alcohol/week: 0.0 standard drinks of alcohol   Drug use: No   Sexual activity: Not Currently  Other Topics Concern   Not on file  Social History Narrative   Retired -  Administrator, arts    Married 53 years       He enjoys traveling    Social Drivers of [employer]: Low Risk  (02/09/2023)   Overall Financial Resource Strain (CARDIA)    Difficulty of Paying Living Expenses: Not very hard  Food Insecurity: No Food Insecurity (02/09/2023)   Hunger Vital Sign    Worried About Running Out of Food in the Last Year: Never true    Ran Out of Food in the Last Year: Never true  Transportation Needs: No Transportation Needs (07/27/2022)   PRAPARE - Administrator, Civil Service (Medical): No    Lack of Transportation (Non-Medical): No  Physical Activity: Insufficiently Active (02/09/2023)    Exercise Vital Sign    Days of Exercise per Week: 3 days    Minutes of Exercise per Session: 30 min  Stress: No Stress Concern Present (02/09/2023)   [employer] of Occupational Health - Occupational Stress Questionnaire    Feeling of Stress : Not at all  Social Connections: Moderately Integrated (02/09/2023)   Social Connection and Isolation Panel [NHANES]    Frequency of Communication with Friends and Family: Twice a week    Frequency of Social Gatherings with Friends and Family: Never    Attends Religious Services: More than 4 times per year    Active Member of [employer] or Organizations: Yes    Attends Engineer, structural: More than 4 times per year    Marital Status: Married   Allergies  Allergen Reactions   Aspirin Other (See Comments)    High doses causes stomach ulcer and bleeding   Family History  Problem Relation Age of Onset   Hypertension Mother    Cancer Father        Mesothelioma    Stomach cancer Brother    Cancer Brother    Cancer - Cervical Brother    Cancer Brother    Diabetes Brother    Esophageal cancer Neg Hx    Colon cancer Neg Hx    Pancreatic cancer Neg Hx       Current Outpatient Medications (Cardiovascular):    amLODipine (NORVASC) 10 MG tablet, Take 1 tablet (10 mg total) by mouth daily.   atorvastatin (LIPITOR) 20 MG tablet, TAKE 1 TABLET BY MOUTH EVERY DAY   cloNIDine (CATAPRES) 0.3 MG tablet, Take 1 tablet (0.3 mg total) by mouth 3 (three) times daily. (Patient taking differently: Take 0.3 mg by mouth 2 (two) times daily.)   doxazosin (CARDURA) 2 MG tablet, Take 2 mg by mouth at bedtime.    isosorbide-hydrALAZINE (BIDIL) 20-37.5 MG tablet, Take 1 tablet by mouth 3 (three) times daily.   metolazone (ZAROXOLYN) 5 MG tablet, Take 5 mg by mouth daily as needed.   torsemide (DEMADEX) 20 MG tablet, Take 40 mg by mouth 2 (two) times daily.   Current Outpatient Medications (Respiratory):    albuterol (VENTOLIN HFA) 108 (90 Base) MCG/ACT  inhaler, TAKE 2 PUFFS BY MOUTH EVERY 6 HOURS AS NEEDED FOR WHEEZE OR SHORTNESS OF BREATH   guaiFENesin (MUCINEX) 600 MG 12 hr tablet, Take 600 mg by mouth at bedtime.   Current Outpatient Medications (Analgesics):    acetaminophen (TYLENOL) 500 MG tablet, Take 1,000 mg by mouth every 4 (four) hours as needed.   Current Outpatient Medications (Hematological):    clopidogrel (PLAVIX) 75 MG tablet, TAKE 1 TABLET BY MOUTH EVERY DAY   Methoxy PEG-Epoetin Beta (MIRCERA IJ), Mircera   Current Outpatient  Medications (Other):    Accu-Chek Softclix Lancets lancets, USED TO CHECK BLOOD GLUCOSE TWICE A DAY OR AS NEEDED   cholecalciferol (VITAMIN D3) 25 MCG (1000 UNIT) tablet, Take 1,000 Units by mouth daily.   esomeprazole (NEXIUM) 40 MG capsule, TAKE 1 CAPSULE BY MOUTH EVERY DAY   glucose blood (ACCU-CHEK GUIDE) test strip, USE TO CHECK BLOOD GLUCOSE TWICE A DAY OR AS NEEDED   hydrocortisone (ANUSOL-HC) 2.5 % rectal cream, Place 1 application rectally 2 (two) times daily.   KLOR-CON M10 10 MEQ tablet, Take by mouth.   lactulose (CHRONULAC) 10 GM/15ML solution, TAKE 30 MLS (20 G TOTAL) BY MOUTH 2 (TWO) TIMES DAILY AS NEEDED.   Melatonin 10 MG TABS, Take 10 mg by mouth at bedtime.   methylcellulose (CITRUCEL) oral powder, 1 tablespoon every 2-3 days,alternating with lactulose   neomycin-polymyxin b-dexamethasone (MAXITROL) 3.5-10000-0.1 SUSP, Place 1 drop into both eyes 4 (four) times daily.   pregabalin (LYRICA) 25 MG capsule, TAKE 1 CAPSULE BY MOUTH EVERY DAY   Propylene Glycol (SYSTANE BALANCE OP), Place 1 drop into both eyes at bedtime.   sevelamer carbonate (RENVELA) 800 MG tablet, Take 800 mg by mouth 3 (three) times daily.   traZODone (DESYREL) 50 MG tablet, Take 1 tablet (50 mg total) by mouth at bedtime.   triamcinolone cream (KENALOG) 0.1 %, Apply 1 application topically daily as needed (dry/irritated skin).  Current Facility-Administered Medications (Other):    ammonium lactate  (LAC-HYDRIN) 12 % lotion    Objective  Blood pressure (!) 110/50, pulse 68, height 5\' 7"  (1.702 m), weight 176 lb (79.8 kg), SpO2 96%.   General: No apparent distress alert and oriented x3 mood and affect normal, dressed appropriately.  HEENT: Pupils equal, extraocular movements intact  Respiratory: Patient's speak in full sentences and does not appear short of breath  Cardiovascular: No lower extremity edema, non tender, no erythema  Right wrist exam  Patient does have good range of motion noted at the moment.  Still has a mild positive Tinel's sign noted.  Decent grip strength but does seem minorly weaker than the contralateral side    Impression and Recommendations:     The above documentation has been reviewed and is accurate and complete Judi Saa, DO

## 2023-11-23 ENCOUNTER — Ambulatory Visit: Payer: Medicare PPO | Admitting: Family Medicine

## 2023-11-23 ENCOUNTER — Other Ambulatory Visit: Payer: Self-pay

## 2023-11-23 VITALS — BP 110/50 | HR 68 | Ht 67.0 in | Wt 176.0 lb

## 2023-11-23 DIAGNOSIS — M25531 Pain in right wrist: Secondary | ICD-10-CM

## 2023-11-23 DIAGNOSIS — G5601 Carpal tunnel syndrome, right upper limb: Secondary | ICD-10-CM | POA: Diagnosis not present

## 2023-11-23 NOTE — Assessment & Plan Note (Signed)
 She left the moment.  Discussed icing regimen and home exercises.  Follow-up 3 months to further evaluate

## 2023-11-23 NOTE — Patient Instructions (Signed)
 We can do injection if needed See me in 2-3 months

## 2023-11-24 DIAGNOSIS — N2581 Secondary hyperparathyroidism of renal origin: Secondary | ICD-10-CM | POA: Diagnosis not present

## 2023-11-24 DIAGNOSIS — Z992 Dependence on renal dialysis: Secondary | ICD-10-CM | POA: Diagnosis not present

## 2023-11-24 DIAGNOSIS — N186 End stage renal disease: Secondary | ICD-10-CM | POA: Diagnosis not present

## 2023-11-27 DIAGNOSIS — Z992 Dependence on renal dialysis: Secondary | ICD-10-CM | POA: Diagnosis not present

## 2023-11-27 DIAGNOSIS — N186 End stage renal disease: Secondary | ICD-10-CM | POA: Diagnosis not present

## 2023-11-27 DIAGNOSIS — N2581 Secondary hyperparathyroidism of renal origin: Secondary | ICD-10-CM | POA: Diagnosis not present

## 2023-11-29 DIAGNOSIS — N2581 Secondary hyperparathyroidism of renal origin: Secondary | ICD-10-CM | POA: Diagnosis not present

## 2023-11-29 DIAGNOSIS — Z992 Dependence on renal dialysis: Secondary | ICD-10-CM | POA: Diagnosis not present

## 2023-11-29 DIAGNOSIS — N186 End stage renal disease: Secondary | ICD-10-CM | POA: Diagnosis not present

## 2023-12-01 DIAGNOSIS — N186 End stage renal disease: Secondary | ICD-10-CM | POA: Diagnosis not present

## 2023-12-01 DIAGNOSIS — Z992 Dependence on renal dialysis: Secondary | ICD-10-CM | POA: Diagnosis not present

## 2023-12-01 DIAGNOSIS — N2581 Secondary hyperparathyroidism of renal origin: Secondary | ICD-10-CM | POA: Diagnosis not present

## 2023-12-04 ENCOUNTER — Other Ambulatory Visit: Payer: Self-pay | Admitting: Adult Health

## 2023-12-04 DIAGNOSIS — Z992 Dependence on renal dialysis: Secondary | ICD-10-CM | POA: Diagnosis not present

## 2023-12-04 DIAGNOSIS — N186 End stage renal disease: Secondary | ICD-10-CM | POA: Diagnosis not present

## 2023-12-04 DIAGNOSIS — N2581 Secondary hyperparathyroidism of renal origin: Secondary | ICD-10-CM | POA: Diagnosis not present

## 2023-12-04 DIAGNOSIS — I10 Essential (primary) hypertension: Secondary | ICD-10-CM

## 2023-12-06 DIAGNOSIS — N186 End stage renal disease: Secondary | ICD-10-CM | POA: Diagnosis not present

## 2023-12-06 DIAGNOSIS — Z992 Dependence on renal dialysis: Secondary | ICD-10-CM | POA: Diagnosis not present

## 2023-12-06 DIAGNOSIS — N2581 Secondary hyperparathyroidism of renal origin: Secondary | ICD-10-CM | POA: Diagnosis not present

## 2023-12-08 DIAGNOSIS — R262 Difficulty in walking, not elsewhere classified: Secondary | ICD-10-CM | POA: Diagnosis not present

## 2023-12-08 DIAGNOSIS — Z992 Dependence on renal dialysis: Secondary | ICD-10-CM | POA: Diagnosis not present

## 2023-12-08 DIAGNOSIS — N186 End stage renal disease: Secondary | ICD-10-CM | POA: Diagnosis not present

## 2023-12-08 DIAGNOSIS — N2581 Secondary hyperparathyroidism of renal origin: Secondary | ICD-10-CM | POA: Diagnosis not present

## 2023-12-08 DIAGNOSIS — E43 Unspecified severe protein-calorie malnutrition: Secondary | ICD-10-CM | POA: Diagnosis not present

## 2023-12-11 DIAGNOSIS — N186 End stage renal disease: Secondary | ICD-10-CM | POA: Diagnosis not present

## 2023-12-11 DIAGNOSIS — Z992 Dependence on renal dialysis: Secondary | ICD-10-CM | POA: Diagnosis not present

## 2023-12-11 DIAGNOSIS — N2581 Secondary hyperparathyroidism of renal origin: Secondary | ICD-10-CM | POA: Diagnosis not present

## 2023-12-12 DIAGNOSIS — I10 Essential (primary) hypertension: Secondary | ICD-10-CM | POA: Diagnosis not present

## 2023-12-12 DIAGNOSIS — I5032 Chronic diastolic (congestive) heart failure: Secondary | ICD-10-CM | POA: Diagnosis not present

## 2023-12-12 DIAGNOSIS — I351 Nonrheumatic aortic (valve) insufficiency: Secondary | ICD-10-CM | POA: Diagnosis not present

## 2023-12-12 DIAGNOSIS — I350 Nonrheumatic aortic (valve) stenosis: Secondary | ICD-10-CM | POA: Diagnosis not present

## 2023-12-13 DIAGNOSIS — N186 End stage renal disease: Secondary | ICD-10-CM | POA: Diagnosis not present

## 2023-12-13 DIAGNOSIS — Z992 Dependence on renal dialysis: Secondary | ICD-10-CM | POA: Diagnosis not present

## 2023-12-13 DIAGNOSIS — N2581 Secondary hyperparathyroidism of renal origin: Secondary | ICD-10-CM | POA: Diagnosis not present

## 2023-12-15 DIAGNOSIS — N186 End stage renal disease: Secondary | ICD-10-CM | POA: Diagnosis not present

## 2023-12-15 DIAGNOSIS — Z992 Dependence on renal dialysis: Secondary | ICD-10-CM | POA: Diagnosis not present

## 2023-12-15 DIAGNOSIS — N2581 Secondary hyperparathyroidism of renal origin: Secondary | ICD-10-CM | POA: Diagnosis not present

## 2023-12-18 DIAGNOSIS — Z992 Dependence on renal dialysis: Secondary | ICD-10-CM | POA: Diagnosis not present

## 2023-12-18 DIAGNOSIS — N2581 Secondary hyperparathyroidism of renal origin: Secondary | ICD-10-CM | POA: Diagnosis not present

## 2023-12-18 DIAGNOSIS — N186 End stage renal disease: Secondary | ICD-10-CM | POA: Diagnosis not present

## 2023-12-19 DIAGNOSIS — E113213 Type 2 diabetes mellitus with mild nonproliferative diabetic retinopathy with macular edema, bilateral: Secondary | ICD-10-CM | POA: Diagnosis not present

## 2023-12-19 DIAGNOSIS — H35033 Hypertensive retinopathy, bilateral: Secondary | ICD-10-CM | POA: Diagnosis not present

## 2023-12-19 DIAGNOSIS — H43823 Vitreomacular adhesion, bilateral: Secondary | ICD-10-CM | POA: Diagnosis not present

## 2023-12-19 DIAGNOSIS — H353132 Nonexudative age-related macular degeneration, bilateral, intermediate dry stage: Secondary | ICD-10-CM | POA: Diagnosis not present

## 2023-12-19 DIAGNOSIS — H31092 Other chorioretinal scars, left eye: Secondary | ICD-10-CM | POA: Diagnosis not present

## 2023-12-19 DIAGNOSIS — H35713 Central serous chorioretinopathy, bilateral: Secondary | ICD-10-CM | POA: Diagnosis not present

## 2023-12-19 DIAGNOSIS — Z961 Presence of intraocular lens: Secondary | ICD-10-CM | POA: Diagnosis not present

## 2023-12-20 DIAGNOSIS — N2581 Secondary hyperparathyroidism of renal origin: Secondary | ICD-10-CM | POA: Diagnosis not present

## 2023-12-20 DIAGNOSIS — Z992 Dependence on renal dialysis: Secondary | ICD-10-CM | POA: Diagnosis not present

## 2023-12-20 DIAGNOSIS — E1022 Type 1 diabetes mellitus with diabetic chronic kidney disease: Secondary | ICD-10-CM | POA: Diagnosis not present

## 2023-12-20 DIAGNOSIS — N186 End stage renal disease: Secondary | ICD-10-CM | POA: Diagnosis not present

## 2023-12-22 DIAGNOSIS — Z992 Dependence on renal dialysis: Secondary | ICD-10-CM | POA: Diagnosis not present

## 2023-12-22 DIAGNOSIS — N186 End stage renal disease: Secondary | ICD-10-CM | POA: Diagnosis not present

## 2023-12-22 DIAGNOSIS — N2581 Secondary hyperparathyroidism of renal origin: Secondary | ICD-10-CM | POA: Diagnosis not present

## 2023-12-25 DIAGNOSIS — Z992 Dependence on renal dialysis: Secondary | ICD-10-CM | POA: Diagnosis not present

## 2023-12-25 DIAGNOSIS — N2581 Secondary hyperparathyroidism of renal origin: Secondary | ICD-10-CM | POA: Diagnosis not present

## 2023-12-25 DIAGNOSIS — N186 End stage renal disease: Secondary | ICD-10-CM | POA: Diagnosis not present

## 2023-12-27 DIAGNOSIS — N2581 Secondary hyperparathyroidism of renal origin: Secondary | ICD-10-CM | POA: Diagnosis not present

## 2023-12-27 DIAGNOSIS — N186 End stage renal disease: Secondary | ICD-10-CM | POA: Diagnosis not present

## 2023-12-27 DIAGNOSIS — Z992 Dependence on renal dialysis: Secondary | ICD-10-CM | POA: Diagnosis not present

## 2023-12-29 DIAGNOSIS — N2581 Secondary hyperparathyroidism of renal origin: Secondary | ICD-10-CM | POA: Diagnosis not present

## 2023-12-29 DIAGNOSIS — N186 End stage renal disease: Secondary | ICD-10-CM | POA: Diagnosis not present

## 2023-12-29 DIAGNOSIS — Z992 Dependence on renal dialysis: Secondary | ICD-10-CM | POA: Diagnosis not present

## 2024-01-01 DIAGNOSIS — N186 End stage renal disease: Secondary | ICD-10-CM | POA: Diagnosis not present

## 2024-01-01 DIAGNOSIS — Z992 Dependence on renal dialysis: Secondary | ICD-10-CM | POA: Diagnosis not present

## 2024-01-01 DIAGNOSIS — N2581 Secondary hyperparathyroidism of renal origin: Secondary | ICD-10-CM | POA: Diagnosis not present

## 2024-01-03 DIAGNOSIS — N2581 Secondary hyperparathyroidism of renal origin: Secondary | ICD-10-CM | POA: Diagnosis not present

## 2024-01-03 DIAGNOSIS — Z992 Dependence on renal dialysis: Secondary | ICD-10-CM | POA: Diagnosis not present

## 2024-01-03 DIAGNOSIS — N186 End stage renal disease: Secondary | ICD-10-CM | POA: Diagnosis not present

## 2024-01-05 DIAGNOSIS — Z992 Dependence on renal dialysis: Secondary | ICD-10-CM | POA: Diagnosis not present

## 2024-01-05 DIAGNOSIS — N2581 Secondary hyperparathyroidism of renal origin: Secondary | ICD-10-CM | POA: Diagnosis not present

## 2024-01-05 DIAGNOSIS — N186 End stage renal disease: Secondary | ICD-10-CM | POA: Diagnosis not present

## 2024-01-08 DIAGNOSIS — Z992 Dependence on renal dialysis: Secondary | ICD-10-CM | POA: Diagnosis not present

## 2024-01-08 DIAGNOSIS — N186 End stage renal disease: Secondary | ICD-10-CM | POA: Diagnosis not present

## 2024-01-08 DIAGNOSIS — N2581 Secondary hyperparathyroidism of renal origin: Secondary | ICD-10-CM | POA: Diagnosis not present

## 2024-01-10 DIAGNOSIS — N186 End stage renal disease: Secondary | ICD-10-CM | POA: Diagnosis not present

## 2024-01-10 DIAGNOSIS — Z992 Dependence on renal dialysis: Secondary | ICD-10-CM | POA: Diagnosis not present

## 2024-01-10 DIAGNOSIS — N2581 Secondary hyperparathyroidism of renal origin: Secondary | ICD-10-CM | POA: Diagnosis not present

## 2024-01-12 DIAGNOSIS — Z992 Dependence on renal dialysis: Secondary | ICD-10-CM | POA: Diagnosis not present

## 2024-01-12 DIAGNOSIS — N186 End stage renal disease: Secondary | ICD-10-CM | POA: Diagnosis not present

## 2024-01-12 DIAGNOSIS — N2581 Secondary hyperparathyroidism of renal origin: Secondary | ICD-10-CM | POA: Diagnosis not present

## 2024-01-15 DIAGNOSIS — Z992 Dependence on renal dialysis: Secondary | ICD-10-CM | POA: Diagnosis not present

## 2024-01-15 DIAGNOSIS — N186 End stage renal disease: Secondary | ICD-10-CM | POA: Diagnosis not present

## 2024-01-15 DIAGNOSIS — N2581 Secondary hyperparathyroidism of renal origin: Secondary | ICD-10-CM | POA: Diagnosis not present

## 2024-01-17 DIAGNOSIS — Z992 Dependence on renal dialysis: Secondary | ICD-10-CM | POA: Diagnosis not present

## 2024-01-17 DIAGNOSIS — N186 End stage renal disease: Secondary | ICD-10-CM | POA: Diagnosis not present

## 2024-01-17 DIAGNOSIS — N2581 Secondary hyperparathyroidism of renal origin: Secondary | ICD-10-CM | POA: Diagnosis not present

## 2024-01-18 ENCOUNTER — Ambulatory Visit: Admitting: Podiatry

## 2024-01-18 ENCOUNTER — Encounter: Payer: Self-pay | Admitting: Podiatry

## 2024-01-18 DIAGNOSIS — M79672 Pain in left foot: Secondary | ICD-10-CM

## 2024-01-18 DIAGNOSIS — I70209 Unspecified atherosclerosis of native arteries of extremities, unspecified extremity: Secondary | ICD-10-CM

## 2024-01-18 DIAGNOSIS — M79671 Pain in right foot: Secondary | ICD-10-CM

## 2024-01-18 DIAGNOSIS — E1151 Type 2 diabetes mellitus with diabetic peripheral angiopathy without gangrene: Secondary | ICD-10-CM | POA: Diagnosis not present

## 2024-01-18 DIAGNOSIS — B351 Tinea unguium: Secondary | ICD-10-CM

## 2024-01-18 NOTE — Progress Notes (Signed)
 Patient presents for evaluation and treatment of tenderness and some redness around nails feet.  Tenderness around toes with walking and wearing shoes.  Physical exam:  General appearance: Alert, pleasant, and in no acute distress.  Vascular: Pedal pulses: DP palpable bilaterally, PT nonpalpable bilaterally.  Moderate edema lower legs bilaterally  Neurological:  0 paresthesias or burning noted  Dermatologic:  Nails thickened, disfigured, discolored 1-5 BL with subungual debris.  Redness and hypertrophic nail folds along nail folds bilaterally but no signs of drainage or infection.  Musculoskeletal:  Hammertoes 2 through 5 bilaterally   Diagnosis: 1. Painful onychomycotic nails 1 through 5 bilaterally. 2. Pain toes 1 through 5 bilaterally. 3. Type 2 diabetes Mellitus with PVD  Plan: Debrided onychomycotic nails 1 through 5 bilaterally.  Return 3 months

## 2024-01-19 DIAGNOSIS — Z992 Dependence on renal dialysis: Secondary | ICD-10-CM | POA: Diagnosis not present

## 2024-01-19 DIAGNOSIS — N2581 Secondary hyperparathyroidism of renal origin: Secondary | ICD-10-CM | POA: Diagnosis not present

## 2024-01-19 DIAGNOSIS — N186 End stage renal disease: Secondary | ICD-10-CM | POA: Diagnosis not present

## 2024-01-20 ENCOUNTER — Other Ambulatory Visit: Payer: Self-pay | Admitting: Adult Health

## 2024-01-20 DIAGNOSIS — N186 End stage renal disease: Secondary | ICD-10-CM | POA: Diagnosis not present

## 2024-01-20 DIAGNOSIS — Z992 Dependence on renal dialysis: Secondary | ICD-10-CM | POA: Diagnosis not present

## 2024-01-20 DIAGNOSIS — E1022 Type 1 diabetes mellitus with diabetic chronic kidney disease: Secondary | ICD-10-CM | POA: Diagnosis not present

## 2024-01-22 DIAGNOSIS — N2581 Secondary hyperparathyroidism of renal origin: Secondary | ICD-10-CM | POA: Diagnosis not present

## 2024-01-22 DIAGNOSIS — N186 End stage renal disease: Secondary | ICD-10-CM | POA: Diagnosis not present

## 2024-01-22 DIAGNOSIS — Z992 Dependence on renal dialysis: Secondary | ICD-10-CM | POA: Diagnosis not present

## 2024-01-23 ENCOUNTER — Ambulatory Visit: Admitting: Family Medicine

## 2024-01-23 DIAGNOSIS — Z961 Presence of intraocular lens: Secondary | ICD-10-CM | POA: Diagnosis not present

## 2024-01-23 DIAGNOSIS — H31092 Other chorioretinal scars, left eye: Secondary | ICD-10-CM | POA: Diagnosis not present

## 2024-01-23 DIAGNOSIS — H35713 Central serous chorioretinopathy, bilateral: Secondary | ICD-10-CM | POA: Diagnosis not present

## 2024-01-23 DIAGNOSIS — H43823 Vitreomacular adhesion, bilateral: Secondary | ICD-10-CM | POA: Diagnosis not present

## 2024-01-23 DIAGNOSIS — H353132 Nonexudative age-related macular degeneration, bilateral, intermediate dry stage: Secondary | ICD-10-CM | POA: Diagnosis not present

## 2024-01-23 DIAGNOSIS — H35033 Hypertensive retinopathy, bilateral: Secondary | ICD-10-CM | POA: Diagnosis not present

## 2024-01-23 DIAGNOSIS — E113213 Type 2 diabetes mellitus with mild nonproliferative diabetic retinopathy with macular edema, bilateral: Secondary | ICD-10-CM | POA: Diagnosis not present

## 2024-01-23 NOTE — Telephone Encounter (Signed)
 Pt needs an appt

## 2024-01-24 DIAGNOSIS — N2581 Secondary hyperparathyroidism of renal origin: Secondary | ICD-10-CM | POA: Diagnosis not present

## 2024-01-24 DIAGNOSIS — N186 End stage renal disease: Secondary | ICD-10-CM | POA: Diagnosis not present

## 2024-01-24 DIAGNOSIS — Z992 Dependence on renal dialysis: Secondary | ICD-10-CM | POA: Diagnosis not present

## 2024-01-25 ENCOUNTER — Emergency Department (HOSPITAL_COMMUNITY)
Admission: EM | Admit: 2024-01-25 | Discharge: 2024-01-26 | Disposition: A | Discharge status: Home / Self Care | Attending: Emergency Medicine | Admitting: Emergency Medicine

## 2024-01-25 ENCOUNTER — Emergency Department (HOSPITAL_COMMUNITY)

## 2024-01-25 ENCOUNTER — Other Ambulatory Visit: Payer: Self-pay

## 2024-01-25 DIAGNOSIS — Z8546 Personal history of malignant neoplasm of prostate: Secondary | ICD-10-CM | POA: Diagnosis not present

## 2024-01-25 DIAGNOSIS — G44209 Tension-type headache, unspecified, not intractable: Secondary | ICD-10-CM | POA: Diagnosis not present

## 2024-01-25 DIAGNOSIS — E1122 Type 2 diabetes mellitus with diabetic chronic kidney disease: Secondary | ICD-10-CM | POA: Diagnosis not present

## 2024-01-25 DIAGNOSIS — I132 Hypertensive heart and chronic kidney disease with heart failure and with stage 5 chronic kidney disease, or end stage renal disease: Secondary | ICD-10-CM | POA: Insufficient documentation

## 2024-01-25 DIAGNOSIS — E876 Hypokalemia: Secondary | ICD-10-CM | POA: Diagnosis not present

## 2024-01-25 DIAGNOSIS — G4489 Other headache syndrome: Secondary | ICD-10-CM

## 2024-01-25 DIAGNOSIS — B349 Viral infection, unspecified: Secondary | ICD-10-CM | POA: Diagnosis not present

## 2024-01-25 DIAGNOSIS — Z992 Dependence on renal dialysis: Secondary | ICD-10-CM | POA: Insufficient documentation

## 2024-01-25 DIAGNOSIS — Z79899 Other long term (current) drug therapy: Secondary | ICD-10-CM | POA: Diagnosis not present

## 2024-01-25 DIAGNOSIS — R053 Chronic cough: Secondary | ICD-10-CM | POA: Diagnosis not present

## 2024-01-25 DIAGNOSIS — Z7984 Long term (current) use of oral hypoglycemic drugs: Secondary | ICD-10-CM | POA: Insufficient documentation

## 2024-01-25 DIAGNOSIS — I5032 Chronic diastolic (congestive) heart failure: Secondary | ICD-10-CM | POA: Diagnosis not present

## 2024-01-25 DIAGNOSIS — N186 End stage renal disease: Secondary | ICD-10-CM | POA: Diagnosis not present

## 2024-01-25 DIAGNOSIS — R519 Headache, unspecified: Secondary | ICD-10-CM | POA: Diagnosis not present

## 2024-01-25 DIAGNOSIS — Z7901 Long term (current) use of anticoagulants: Secondary | ICD-10-CM | POA: Diagnosis not present

## 2024-01-25 DIAGNOSIS — Z794 Long term (current) use of insulin: Secondary | ICD-10-CM | POA: Diagnosis not present

## 2024-01-25 DIAGNOSIS — J0100 Acute maxillary sinusitis, unspecified: Secondary | ICD-10-CM | POA: Diagnosis not present

## 2024-01-25 LAB — CBC WITH DIFFERENTIAL/PLATELET
Abs Immature Granulocytes: 0.01 10*3/uL (ref 0.00–0.07)
Basophils Absolute: 0 10*3/uL (ref 0.0–0.1)
Basophils Relative: 1 %
Eosinophils Absolute: 0.1 10*3/uL (ref 0.0–0.5)
Eosinophils Relative: 2 %
HCT: 34.9 % — ABNORMAL LOW (ref 39.0–52.0)
Hemoglobin: 11.1 g/dL — ABNORMAL LOW (ref 13.0–17.0)
Immature Granulocytes: 0 %
Lymphocytes Relative: 17 %
Lymphs Abs: 1.1 10*3/uL (ref 0.7–4.0)
MCH: 29.3 pg (ref 26.0–34.0)
MCHC: 31.8 g/dL (ref 30.0–36.0)
MCV: 92.1 fL (ref 80.0–100.0)
Monocytes Absolute: 0.7 10*3/uL (ref 0.1–1.0)
Monocytes Relative: 10 %
Neutro Abs: 4.5 10*3/uL (ref 1.7–7.7)
Neutrophils Relative %: 70 %
Platelets: 256 10*3/uL (ref 150–400)
RBC: 3.79 MIL/uL — ABNORMAL LOW (ref 4.22–5.81)
RDW: 15.5 % (ref 11.5–15.5)
WBC: 6.5 10*3/uL (ref 4.0–10.5)
nRBC: 0 % (ref 0.0–0.2)

## 2024-01-25 LAB — BASIC METABOLIC PANEL WITH GFR
Anion gap: 16 — ABNORMAL HIGH (ref 5–15)
BUN: 34 mg/dL — ABNORMAL HIGH (ref 8–23)
CO2: 27 mmol/L (ref 22–32)
Calcium: 8.9 mg/dL (ref 8.9–10.3)
Chloride: 94 mmol/L — ABNORMAL LOW (ref 98–111)
Creatinine, Ser: 8.04 mg/dL — ABNORMAL HIGH (ref 0.61–1.24)
GFR, Estimated: 6 mL/min — ABNORMAL LOW (ref >60)
Glucose, Bld: 106 mg/dL — ABNORMAL HIGH (ref 70–99)
Potassium: 4.2 mmol/L (ref 3.5–5.1)
Sodium: 137 mmol/L (ref 135–145)

## 2024-01-25 LAB — RESP PANEL BY RT-PCR (RSV, FLU A&B, COVID)  RVPGX2
Influenza A by PCR: NEGATIVE
Influenza B by PCR: NEGATIVE
Resp Syncytial Virus by PCR: NEGATIVE
SARS Coronavirus 2 by RT PCR: NEGATIVE

## 2024-01-25 NOTE — ED Triage Notes (Addendum)
 Pt. Stated, Austin [name] had a headache since yesterday that's on the right and goes up. Tylenol  not working. Last dose was 0900. Pt had dialysis yesterday had the full treatment. Left upper arm access

## 2024-01-25 NOTE — ED Provider Triage Note (Signed)
 Emergency Medicine Provider Triage Evaluation Note  Austin [name] , a 89 y.o. male  was evaluated in triage.  Pt complains of right sided headache since yesterday. Patient stating that headache went away yesterday tylenol  and BP medication. Headache became severe today when patient woke up. Patient with chronic cough which he states is d/t dust mites.   Denies fever, nausea, vomiting, diarrhea. Denies head trauma, LOC, seizures, recent vision changes.    Review of Systems  Positive:  Negative:   Physical Exam  BP (!) 152/61 (BP Location: Right Arm)   Pulse 73   Temp 98.5 F (36.9 C)   Resp 18   SpO2 97%  Gen:   Awake, no distress   Resp:  Normal effort  MSK:   Moves extremities without difficulty  Other:    Medical Decision Making  Medically screening exam initiated at 2:21 PM.  Appropriate orders placed.  Austin [name] was informed that the remainder of the evaluation will be completed by another provider, this initial triage assessment does not replace that evaluation, and the importance of remaining in the ED until their evaluation is complete.    [place], [place] 01/25/24 1424

## 2024-01-26 DIAGNOSIS — N186 End stage renal disease: Secondary | ICD-10-CM | POA: Diagnosis not present

## 2024-01-26 DIAGNOSIS — Z992 Dependence on renal dialysis: Secondary | ICD-10-CM | POA: Diagnosis not present

## 2024-01-26 DIAGNOSIS — N2581 Secondary hyperparathyroidism of renal origin: Secondary | ICD-10-CM | POA: Diagnosis not present

## 2024-01-26 MED ORDER — AMOXICILLIN-POT CLAVULANATE 500-125 MG PO TABS: ORAL_TABLET | ORAL | 0 refills | Status: DC

## 2024-01-26 NOTE — ED Provider Notes (Signed)
  EMERGENCY DEPARTMENT AT [facility] Provider Note   CSN: [id number] Arrival date & time: 01/25/24  1404     History  Chief Complaint  Patient presents with   Headache    Austin [name] is a 89 y.o. male.  The history is provided by the patient, the spouse and a relative.  Patient w/history of chronic diastolic heart failure, ESRD on dialysis, diabetes presents with headache He reports has had right-sided headache over the past day that has essentially resolved.  No fevers or vomiting.  No new visual changes.  No facial pain or swelling.  He has had a chronic cough.  No arm or leg weakness, no slurred speech.  No confusion.  Family at bedside reports he has been acting appropriately without any difficulty speaking or mentation changes  Patient reports he gets headaches frequently due to his blood pressure medications, but does seem worse than normal.  It was gradual in onset. Patient has not had any missed dialysis sessions 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 BLOOD LOSS 03/13/2007   CAROTID ARTERY STENOSIS 05/10/2010   CHF (congestive heart failure) (HCC)    DIABETES MELLITUS, TYPE II 09/19/2007   DISEASE, CEREBROVASCULAR NEC 03/05/2007   GERD 03/13/2007   HYPERLIPIDEMIA 03/05/2007   HYPERTENSION 03/05/2007   HYPOKALEMIA 11/09/2009   KNEE PAIN, RIGHT 11/09/2009   PROSTATE CANCER, HX OF 03/05/2007   RENAL DISEASE, CHRONIC 02/03/2009    Home Medications Prior to Admission medications   Medication Sig Start Date End Date Taking? Authorizing Provider  amoxicillin-clavulanate (AUGMENTIN) 500-125 MG tablet Take one pill by mouth every day for 7 days On dialysis days take after your dialysis sessions 01/26/24  Yes Eldon Greenland, MD  Accu-Chek Softclix Lancets lancets USED TO CHECK BLOOD GLUCOSE TWICE A  DAY OR AS NEEDED 06/08/23   Nafziger, Randel Buss, NP  acetaminophen  (TYLENOL ) 500 MG tablet Take 1,000 mg by mouth every 4 (four) hours as needed.    [provider]  albuterol  (VENTOLIN  HFA) 108 (90 Base) MCG/ACT inhaler TAKE 2 PUFFS BY MOUTH EVERY 6 HOURS AS NEEDED FOR WHEEZE OR SHORTNESS OF BREATH 09/13/22   Denson Flake, MD  amLODipine  (NORVASC ) 10 MG tablet TAKE 1 TABLET BY MOUTH EVERY DAY 12/05/23   Nafziger, Randel Buss, NP  atorvastatin  (LIPITOR) 20 MG tablet TAKE 1 TABLET BY MOUTH EVERY DAY 04/11/23   Nafziger, Randel Buss, NP  cholecalciferol  (VITAMIN D3) 25 MCG (1000 UNIT) tablet Take 1,000 Units by mouth daily.    [provider]  cloNIDine  (CATAPRES ) 0.3 MG tablet Take 1 tablet (0.3 mg total) by mouth 3 (three) times daily. Patient taking differently: Take 0.3 mg by mouth 2 (two) times daily. 09/20/20   Segal, Jared E, MD  clopidogrel  (PLAVIX ) 75 MG tablet TAKE 1 TABLET BY MOUTH EVERY DAY 07/18/23   Nafziger, Randel Buss, NP  doxazosin  (CARDURA ) 2 MG tablet Take 2 mg by mouth at bedtime.  12/28/17   [provider]  esomeprazole  (NEXIUM ) 40 MG capsule TAKE 1 CAPSULE BY MOUTH EVERY DAY 04/11/23   Nafziger, Randel Buss, NP  glucose blood (ACCU-CHEK GUIDE) test strip USE TO CHECK BLOOD GLUCOSE TWICE A DAY OR AS NEEDED 04/27/23   Nafziger, Randel Buss, NP  guaiFENesin  (MUCINEX ) 600 MG 12 hr tablet Take 600 mg by mouth at bedtime.    [provider]  hydrocortisone  (  ANUSOL -HC) 2.5 % rectal cream Place 1 application rectally 2 (two) times daily. 10/12/21   Nafziger, Randel Buss, NP  isosorbide -hydrALAZINE  (BIDIL ) 20-37.5 MG tablet Take 1 tablet by mouth 3 (three) times daily.    [provider]  KLOR-CON  M10 10 MEQ tablet Take by mouth. 11/16/22   [provider]  lactulose  (CHRONULAC ) 10 GM/15ML solution TAKE 30 MLS (20 G TOTAL) BY MOUTH 2 (TWO) TIMES DAILY AS NEEDED. 01/23/24   Nafziger, Randel Buss, NP  Melatonin 10 MG TABS Take 10 mg by mouth at bedtime.    [provider]  Methoxy  PEG-Epoetin  Beta (MIRCERA IJ) Mircera 10/30/23 10/28/24  [provider]  methylcellulose (CITRUCEL) oral powder 1 tablespoon every 2-3 days,alternating with lactulose     [provider]  metolazone  (ZAROXOLYN ) 5 MG tablet Take 5 mg by mouth daily as needed. 12/01/22   [provider]  neomycin-polymyxin b-dexamethasone  (MAXITROL) 3.5-10000-0.1 SUSP Place 1 drop into both eyes 4 (four) times daily. 10/13/22   [provider]  pregabalin  (LYRICA ) 25 MG capsule TAKE 1 CAPSULE BY MOUTH EVERY DAY 05/09/23   Nafziger, Randel Buss, NP  Propylene Glycol (SYSTANE BALANCE OP) Place 1 drop into both eyes at bedtime.    [provider]  sevelamer carbonate (RENVELA) 800 MG tablet Take 800 mg by mouth 3 (three) times daily. 12/08/22   [provider]  torsemide  (DEMADEX ) 20 MG tablet Take 40 mg by mouth 2 (two) times daily. 12/10/22   [provider]  traZODone  (DESYREL ) 50 MG tablet Take 1 tablet (50 mg total) by mouth at bedtime. 11/05/22   Gherghe, Costin M, MD  triamcinolone  cream (KENALOG ) 0.1 % Apply 1 application topically daily as needed (dry/irritated skin). 07/05/19   [provider]      Allergies    Aspirin    Review of Systems   Review of Systems  Constitutional:  Negative for fever.  Eyes:  Negative for visual disturbance.  Respiratory:  Negative for shortness of breath.   Cardiovascular:  Negative for chest pain.  Neurological:  Positive for headaches. Negative for dizziness, speech difficulty and weakness.  Psychiatric/Behavioral:  Negative for confusion.     Physical Exam Updated Vital Signs BP (!) 168/58 (BP Location: Right Arm)   Pulse 72   Temp 98.2 F (36.8 C) (Oral)   Resp 18   Ht 1.702 m (5\' 7" )   Wt 74.8 kg   SpO2 99%   BMI 25.84 kg/m  Physical Exam CONSTITUTIONAL: Elderly but overall well-appearing HEAD: Normocephalic/atraumatic, no temporal tenderness EYES: EOMI/PERRL, no nystagmus, no ptosis, no proptosis,  no corneal haziness ENMT: Mucous membranes moist, no facial tenderness, no facial swelling or bogginess or crepitus NECK: supple no meningeal signs CV: S1/S2 noted, no murmurs/rubs/gallops noted LUNGS: Lungs are clear to auscultation bilaterally, no apparent distress ABDOMEN: soft, nontender NEURO:Awake/alert, face symmetric, no arm or leg drift is noted Equal 5/5 strength with shoulder abduction, elbow flex/extension, wrist flex/extension in upper extremities and equal hand grips bilaterally Equal 5/5 strength with hip flexion,knee flex/extension, foot dorsi/plantar flexion Cranial nerves 3/4/5/6/02/27/09/11/12 tested and intact Patient has been walking in the ER No past pointing Sensation to light touch intact in all extremities SKIN: warm, color normal PSYCH: no abnormalities of mood noted, alert and oriented to situation  ED Results / Procedures / Treatments   Labs (all labs ordered are listed, but only abnormal results are displayed) Labs Reviewed  BASIC METABOLIC PANEL WITH GFR - Abnormal; Notable for the following components:  Result Value   Chloride 94 (*)    Glucose, Bld 106 (*)    BUN 34 (*)    Creatinine, Ser 8.04 (*)    GFR, Estimated 6 (*)    Anion gap 16 (*)    All other components within normal limits  CBC WITH DIFFERENTIAL/PLATELET - Abnormal; Notable for the following components:   RBC 3.79 (*)    Hemoglobin 11.1 (*)    HCT 34.9 (*)    All other components within normal limits  RESP PANEL BY RT-PCR (RSV, FLU A&B, COVID)  [id number]    EKG None  Radiology CT Head Wo Contrast Result Date: 01/25/2024 CLINICAL DATA:  Headache, increasing frequency or severity EXAM: CT HEAD WITHOUT CONTRAST TECHNIQUE: Contiguous axial images were obtained from the base of the skull through the vertex without intravenous contrast. RADIATION DOSE REDUCTION: This exam was performed according to the departmental dose-optimization program which includes automated exposure control,  adjustment of the mA and/or kV according to patient size and/or use of iterative reconstruction technique. COMPARISON:  December 12, 2006 FINDINGS: Brain: Proportional prominence of the ventricles and sulci, consistent with diffuse cerebral parenchymal volume loss. The ventricles otherwise maintained midline position without midline shift. Gray-white differentiation is preserved.Periventricular and subcortical white matter hypoattenuation, most consistent with changes of moderate chronic ischemic microvascular disease.No evidence of acute territorial infarction, extra-axial fluid collection, hemorrhage, or mass lesion. The basilar cisterns are patent without downward herniation. The cerebellar hemispheres and vermis are well formed without mass lesion or focal attenuation abnormality. Vascular: No hyperdense vessel. Calcified atherosclerotic plaque within the cavernous/supraclinoid ICA and intradural vertebral arteries. Skull: Normal. Negative for fracture or focal lesion. Sinuses/Orbits: Completely opacified right maxillary sinus with internal calcification. The remaining paranasal sinuses are clear. The globes appear intact. No retrobulbar hematoma. Other: None. IMPRESSION: 1. No acute intracranial abnormality, specifically, no acute hemorrhage, territorial infarction, or intracranial mass. 2. Findings of global cerebral volume loss with sequelae of chronic ischemic microvascular disease. 3. Complete opacification of the right maxillary sinus with internal calcification, which can be seen in a fungal sinusitis. Electronically Signed   By: Rance Burrows M.D.   On: 01/25/2024 16:14    Procedures Procedures    Medications Ordered in ED Medications - No data to display  ED Course/ Medical Decision Making/ A&P                                 Medical Decision Making Risk Prescription drug management.   This patient presents to the ED for concern of headache, this involves an extensive number of  treatment options, and is a complaint that carries with it a high risk of complications and morbidity.  The differential diagnosis includes but is not limited to subarachnoid hemorrhage, intracranial hemorrhage, meningitis, encephalitis, CVST, temporal arteritis, idiopathic intracranial hypertension, migraine, glaucoma, sinusitis    Comorbidities that complicate the patient evaluation: Patient's presentation is complicated by their history of end-stage renal disease, hypertension  Additional history obtained: Additional history obtained from family and spouse  Lab Tests: I Ordered, and personally interpreted labs.  The pertinent results include: Chronic renal failure  Imaging Studies ordered: I ordered imaging studies including CT scan head  I independently visualized and interpreted imaging which showed no acute findings, sinusitis I agree with the radiologist interpretation  Test Considered: I consider further workup including MRI brain, but since patient is improved will defer at this time  Reevaluation: After the  interventions noted above, I reevaluated the patient and found that they have :improved  Complexity of problems addressed: Patient's presentation is most consistent with  acute presentation with potential threat to life or bodily function  Disposition: After consideration of the diagnostic results and the patient's response to treatment,  I feel that the patent would benefit from discharge  .    Patient with a headache over the past day that has essentially resolved.  He is smiling, no acute distress and well-appearing.  No focal neurodeficits.  Will defer further workup for now  ?Sinusitis on CT imaging but this is likely a chronic issue He is nontoxic, no meningeal signs, and no significant facial tenderness He would benefit from outpatient otolaryngology workup.  Will give course of antibiotics for sinusitis for 1 week but is been renally dosed and then follow-up as  an outpatient  Patient given strict ER return precautions in his discharge paperwork       Final Clinical Impression(s) / ED Diagnoses Final diagnoses:  Other headache syndrome  Acute non-recurrent maxillary sinusitis    Rx / DC Orders ED Discharge Orders          Ordered    amoxicillin-clavulanate (AUGMENTIN) 500-125 MG tablet        01/26/24 0018              Eldon Greenland, MD 01/26/24 [phone]

## 2024-01-26 NOTE — Discharge Instructions (Addendum)
You are having a headache. No specific cause was found today for your headache. It may have been a migraine or other cause of headache. Stress, anxiety, fatigue, and depression are common triggers for headaches. Your headache today does not appear to be life-threatening or require hospitalization, but often the exact cause of headaches is not determined in the emergency department. Therefore, follow-up with your doctor is very important to find out what may have caused your headache, and whether or not you need any further diagnostic testing or treatment. Sometimes headaches can appear benign (not harmful), but then more serious symptoms can develop which should prompt an immediate re-evaluation by your doctor or the emergency department. ° °SEEK MEDICAL ATTENTION IF: ° °You develop possible problems with medications prescribed.  °The medications don't resolve your headache, if it recurs , or if you have multiple episodes of vomiting or can't take fluids. °You have a change from the usual headache. ° °RETURN IMMEDIATELY IF you develop a sudden, severe headache or confusion, become poorly responsive or faint, develop a fever above 100.4F or problem breathing, have a change in speech, vision, swallowing, or understanding, or develop new weakness, numbness, tingling, incoordination, or have a seizure. ° °

## 2024-01-29 DIAGNOSIS — N2581 Secondary hyperparathyroidism of renal origin: Secondary | ICD-10-CM | POA: Diagnosis not present

## 2024-01-29 DIAGNOSIS — N186 End stage renal disease: Secondary | ICD-10-CM | POA: Diagnosis not present

## 2024-01-29 DIAGNOSIS — Z992 Dependence on renal dialysis: Secondary | ICD-10-CM | POA: Diagnosis not present

## 2024-01-31 DIAGNOSIS — Z992 Dependence on renal dialysis: Secondary | ICD-10-CM | POA: Diagnosis not present

## 2024-01-31 DIAGNOSIS — N2581 Secondary hyperparathyroidism of renal origin: Secondary | ICD-10-CM | POA: Diagnosis not present

## 2024-01-31 DIAGNOSIS — N186 End stage renal disease: Secondary | ICD-10-CM | POA: Diagnosis not present

## 2024-02-01 DIAGNOSIS — J320 Chronic maxillary sinusitis: Secondary | ICD-10-CM | POA: Diagnosis not present

## 2024-02-02 DIAGNOSIS — N186 End stage renal disease: Secondary | ICD-10-CM | POA: Diagnosis not present

## 2024-02-02 DIAGNOSIS — N2581 Secondary hyperparathyroidism of renal origin: Secondary | ICD-10-CM | POA: Diagnosis not present

## 2024-02-02 DIAGNOSIS — Z992 Dependence on renal dialysis: Secondary | ICD-10-CM | POA: Diagnosis not present

## 2024-02-03 ENCOUNTER — Other Ambulatory Visit: Payer: Self-pay | Admitting: Adult Health

## 2024-02-05 DIAGNOSIS — Z992 Dependence on renal dialysis: Secondary | ICD-10-CM | POA: Diagnosis not present

## 2024-02-05 DIAGNOSIS — N2581 Secondary hyperparathyroidism of renal origin: Secondary | ICD-10-CM | POA: Diagnosis not present

## 2024-02-05 DIAGNOSIS — N186 End stage renal disease: Secondary | ICD-10-CM | POA: Diagnosis not present

## 2024-02-05 NOTE — Telephone Encounter (Signed)
 Patient need to schedule for more refills.

## 2024-02-07 DIAGNOSIS — N186 End stage renal disease: Secondary | ICD-10-CM | POA: Diagnosis not present

## 2024-02-07 DIAGNOSIS — N2581 Secondary hyperparathyroidism of renal origin: Secondary | ICD-10-CM | POA: Diagnosis not present

## 2024-02-07 DIAGNOSIS — Z992 Dependence on renal dialysis: Secondary | ICD-10-CM | POA: Diagnosis not present

## 2024-02-09 DIAGNOSIS — N2581 Secondary hyperparathyroidism of renal origin: Secondary | ICD-10-CM | POA: Diagnosis not present

## 2024-02-09 DIAGNOSIS — Z992 Dependence on renal dialysis: Secondary | ICD-10-CM | POA: Diagnosis not present

## 2024-02-09 DIAGNOSIS — N186 End stage renal disease: Secondary | ICD-10-CM | POA: Diagnosis not present

## 2024-02-12 DIAGNOSIS — N2581 Secondary hyperparathyroidism of renal origin: Secondary | ICD-10-CM | POA: Diagnosis not present

## 2024-02-12 DIAGNOSIS — N186 End stage renal disease: Secondary | ICD-10-CM | POA: Diagnosis not present

## 2024-02-12 DIAGNOSIS — Z992 Dependence on renal dialysis: Secondary | ICD-10-CM | POA: Diagnosis not present

## 2024-02-14 DIAGNOSIS — N2581 Secondary hyperparathyroidism of renal origin: Secondary | ICD-10-CM | POA: Diagnosis not present

## 2024-02-14 DIAGNOSIS — N186 End stage renal disease: Secondary | ICD-10-CM | POA: Diagnosis not present

## 2024-02-14 DIAGNOSIS — Z992 Dependence on renal dialysis: Secondary | ICD-10-CM | POA: Diagnosis not present

## 2024-02-15 ENCOUNTER — Other Ambulatory Visit: Payer: Self-pay | Admitting: Adult Health

## 2024-02-16 DIAGNOSIS — N186 End stage renal disease: Secondary | ICD-10-CM | POA: Diagnosis not present

## 2024-02-16 DIAGNOSIS — N2581 Secondary hyperparathyroidism of renal origin: Secondary | ICD-10-CM | POA: Diagnosis not present

## 2024-02-16 DIAGNOSIS — Z992 Dependence on renal dialysis: Secondary | ICD-10-CM | POA: Diagnosis not present

## 2024-02-16 NOTE — Telephone Encounter (Signed)
 Patient need to schedule for more refills.

## 2024-02-18 ENCOUNTER — Other Ambulatory Visit: Payer: Self-pay | Admitting: Adult Health

## 2024-02-19 DIAGNOSIS — E1022 Type 1 diabetes mellitus with diabetic chronic kidney disease: Secondary | ICD-10-CM | POA: Diagnosis not present

## 2024-02-19 DIAGNOSIS — Z992 Dependence on renal dialysis: Secondary | ICD-10-CM | POA: Diagnosis not present

## 2024-02-19 DIAGNOSIS — N2581 Secondary hyperparathyroidism of renal origin: Secondary | ICD-10-CM | POA: Diagnosis not present

## 2024-02-19 DIAGNOSIS — N186 End stage renal disease: Secondary | ICD-10-CM | POA: Diagnosis not present

## 2024-02-20 DIAGNOSIS — J320 Chronic maxillary sinusitis: Secondary | ICD-10-CM | POA: Diagnosis not present

## 2024-02-21 DIAGNOSIS — N186 End stage renal disease: Secondary | ICD-10-CM | POA: Diagnosis not present

## 2024-02-21 DIAGNOSIS — Z992 Dependence on renal dialysis: Secondary | ICD-10-CM | POA: Diagnosis not present

## 2024-02-21 DIAGNOSIS — N2581 Secondary hyperparathyroidism of renal origin: Secondary | ICD-10-CM | POA: Diagnosis not present

## 2024-02-22 DIAGNOSIS — R9721 Rising PSA following treatment for malignant neoplasm of prostate: Secondary | ICD-10-CM | POA: Diagnosis not present

## 2024-02-23 DIAGNOSIS — Z992 Dependence on renal dialysis: Secondary | ICD-10-CM | POA: Diagnosis not present

## 2024-02-23 DIAGNOSIS — N2581 Secondary hyperparathyroidism of renal origin: Secondary | ICD-10-CM | POA: Diagnosis not present

## 2024-02-23 DIAGNOSIS — N186 End stage renal disease: Secondary | ICD-10-CM | POA: Diagnosis not present

## 2024-02-24 ENCOUNTER — Other Ambulatory Visit: Payer: Self-pay | Admitting: Adult Health

## 2024-02-26 DIAGNOSIS — Z992 Dependence on renal dialysis: Secondary | ICD-10-CM | POA: Diagnosis not present

## 2024-02-26 DIAGNOSIS — N2581 Secondary hyperparathyroidism of renal origin: Secondary | ICD-10-CM | POA: Diagnosis not present

## 2024-02-26 DIAGNOSIS — N186 End stage renal disease: Secondary | ICD-10-CM | POA: Diagnosis not present

## 2024-02-27 DIAGNOSIS — J328 Other chronic sinusitis: Secondary | ICD-10-CM | POA: Diagnosis not present

## 2024-02-28 DIAGNOSIS — N186 End stage renal disease: Secondary | ICD-10-CM | POA: Diagnosis not present

## 2024-02-28 DIAGNOSIS — Z992 Dependence on renal dialysis: Secondary | ICD-10-CM | POA: Diagnosis not present

## 2024-02-28 DIAGNOSIS — N2581 Secondary hyperparathyroidism of renal origin: Secondary | ICD-10-CM | POA: Diagnosis not present

## 2024-02-29 DIAGNOSIS — R9721 Rising PSA following treatment for malignant neoplasm of prostate: Secondary | ICD-10-CM | POA: Diagnosis not present

## 2024-02-29 DIAGNOSIS — R3121 Asymptomatic microscopic hematuria: Secondary | ICD-10-CM | POA: Diagnosis not present

## 2024-03-01 ENCOUNTER — Other Ambulatory Visit: Payer: Self-pay | Admitting: Adult Health

## 2024-03-01 DIAGNOSIS — N186 End stage renal disease: Secondary | ICD-10-CM | POA: Diagnosis not present

## 2024-03-01 DIAGNOSIS — Z992 Dependence on renal dialysis: Secondary | ICD-10-CM | POA: Diagnosis not present

## 2024-03-01 DIAGNOSIS — N2581 Secondary hyperparathyroidism of renal origin: Secondary | ICD-10-CM | POA: Diagnosis not present

## 2024-03-04 ENCOUNTER — Ambulatory Visit: Payer: Self-pay

## 2024-03-04 ENCOUNTER — Other Ambulatory Visit: Payer: Self-pay | Admitting: Adult Health

## 2024-03-04 DIAGNOSIS — Z992 Dependence on renal dialysis: Secondary | ICD-10-CM | POA: Diagnosis not present

## 2024-03-04 DIAGNOSIS — N2581 Secondary hyperparathyroidism of renal origin: Secondary | ICD-10-CM | POA: Diagnosis not present

## 2024-03-04 DIAGNOSIS — N186 End stage renal disease: Secondary | ICD-10-CM | POA: Diagnosis not present

## 2024-03-04 NOTE — Telephone Encounter (Unsigned)
 Copied from CRM [id number]. Topic: Clinical - Medication Refill >> Mar 04, 2024  3:20 PM Shardie S wrote: Medication: lactulose  (CHRONULAC ) 10 GM/15ML solution  Has the patient contacted their pharmacy? Yes (Agent: If no, request that the patient contact the pharmacy for the refill. If patient does not wish to contact the pharmacy document the reason why and proceed with request.) (Agent: If yes, when and what did the pharmacy advise?)  This is the patient's preferred pharmacy:  [id number] - [place], [place] - [address] AT [address] OF [facility] [address] [place] [address] Phone: [phone] Fax: [phone]  [facility] [address] [place] [address] Phone: [phone] Fax: [phone]  Is this the correct pharmacy for this prescription? Yes If no, delete pharmacy and type the correct one.   Has the prescription been filled recently? Yes  Is the patient out of the medication? Yes  Has the patient been seen for an appointment in the last year OR does the patient have an upcoming appointment? Yes  Can we respond through MyChart? No  Agent: Please be advised that Rx refills may take up to 3 business days. We ask that you follow-up with your pharmacy.

## 2024-03-04 NOTE — Telephone Encounter (Signed)
 Message from Hastings S sent at 03/04/2024  3:24 PM EDT  Constipation, patient has been without medication for 1 week, lactulose  (CHRONULAC ) 10 GM/15ML solution   Called and spoke to pt's wife: wife informed pt out of medication and needs this refill.  Informed wife refill could take up to 3 days to be filled.  Meanwhile wife states will have to get something over the outer to assist until lactose medication is refilled.

## 2024-03-05 MED ORDER — LACTULOSE 10 GM/15ML PO SOLN: ORAL | 0 refills | Status: DC

## 2024-03-05 NOTE — Telephone Encounter (Signed)
 Tried to reach pt months ago to advised that he is due for an appt. We have not seen pt in over a year. A month supply was called in this year with pt needing to f/u for further refills. Called and was able to speak to pt and he has been scheduled. Will refill medication today for a short supply until pt is seen.

## 2024-03-05 NOTE — Telephone Encounter (Signed)
 Copied from CRM [id number]. Topic: Clinical - Medication Question >> Mar 04, 2024  9:06 AM Chasity T wrote: Reason for CRM: Patient is calling in because he is in need of medication lactulose  (CHRONULAC ) 10 GM/15ML solution. Refill request has been put on in Friday 03/01/24 and advised patient that it takes up to 3 business days to get sent to the pharmacy but he is stating he needs the medication today because he is all out of it.

## 2024-03-05 NOTE — Telephone Encounter (Signed)
 Copied from CRM [id number]. Topic: Clinical - Medication Question >> Mar 05, 2024 11:38 AM Austin [name] wrote: Reason for CRM: Pharmacy is calling because patient stated we sent a prescription for lactulose  (CHRONULAC ) 10 GM/15ML solution [[id number]]; however, they had not received anything as of yet. After reviewing previous message informed that the request was pended to the provider . >> Mar 05, 2024 11:45 AM Austin [name] wrote: Patient called back to check on his medication refill. Pt was advised that the refill order has been put in but it does take up to 3 business days to complete med refills. Pt says he is out and cannot leave home until he gets his medication.

## 2024-03-05 NOTE — Telephone Encounter (Signed)
 The medication was sent in this morning. However, it look like it failed electronically. I called pharmacy to give a verbal. Rx will be ready today. Pt notified of update.

## 2024-03-05 NOTE — Telephone Encounter (Signed)
 Copied from CRM [id number]. Topic: Clinical - Medication Question >> Mar 05, 2024 11:38 AM Franky GRADE wrote: Reason for CRM: Pharmacy is calling because patient stated we sent a prescription for lactulose  (CHRONULAC ) 10 GM/15ML solution [[id number]]; however, they had not received anything as of yet. After reviewing previous message informed that the request was pended to the provider . >> Mar 05, 2024 11:45 AM Mia F wrote: Patient called back to check on his medication refill. Pt was advised that the refill order has been put in but it does take up to 3 business days to complete med refills. Pt says he is out and cannot leave home until he gets his medication.

## 2024-03-05 NOTE — Telephone Encounter (Signed)
 Copied from CRM [id number]. Topic: General - Other >> Mar 04, 2024  2:33 PM Deaijah H wrote: Reason for CRM: Sari Patient's wife called in stating pateitn NEEDS medication lactulose  (CHRONULAC ) 10 GM/15ML solution. Does not want patient to end up in hospital. Please once and if completed.

## 2024-03-05 NOTE — Telephone Encounter (Signed)
 Copied from CRM [id number]. Topic: Clinical - Medication Question >> Mar 05, 2024 11:17 AM Charolett L wrote: Reason for CRM: [facility]  [facility] lactulose  (CHRONULAC ) 10 GM/15ML solution sent to another pharmacy. Patient stated that he will call back with pharmacy name

## 2024-03-05 NOTE — Addendum Note (Signed)
 Addended by: VICCI LEADER R on: 03/05/2024 02:24 PM   Modules accepted: Orders

## 2024-03-06 DIAGNOSIS — N2581 Secondary hyperparathyroidism of renal origin: Secondary | ICD-10-CM | POA: Diagnosis not present

## 2024-03-06 DIAGNOSIS — N186 End stage renal disease: Secondary | ICD-10-CM | POA: Diagnosis not present

## 2024-03-06 DIAGNOSIS — Z992 Dependence on renal dialysis: Secondary | ICD-10-CM | POA: Diagnosis not present

## 2024-03-07 ENCOUNTER — Ambulatory Visit: Payer: Self-pay | Admitting: Adult Health

## 2024-03-07 ENCOUNTER — Other Ambulatory Visit: Payer: Self-pay | Admitting: Adult Health

## 2024-03-07 ENCOUNTER — Encounter: Payer: Self-pay | Admitting: Adult Health

## 2024-03-07 ENCOUNTER — Ambulatory Visit: Admitting: Adult Health

## 2024-03-07 VITALS — BP 120/60 | HR 62 | Temp 98.5°F | Ht 67.0 in | Wt 172.0 lb

## 2024-03-07 DIAGNOSIS — I5033 Acute on chronic diastolic (congestive) heart failure: Secondary | ICD-10-CM

## 2024-03-07 DIAGNOSIS — E119 Type 2 diabetes mellitus without complications: Secondary | ICD-10-CM

## 2024-03-07 DIAGNOSIS — I2510 Atherosclerotic heart disease of native coronary artery without angina pectoris: Secondary | ICD-10-CM | POA: Diagnosis not present

## 2024-03-07 DIAGNOSIS — E1169 Type 2 diabetes mellitus with other specified complication: Secondary | ICD-10-CM

## 2024-03-07 DIAGNOSIS — Z7984 Long term (current) use of oral hypoglycemic drugs: Secondary | ICD-10-CM

## 2024-03-07 DIAGNOSIS — I2583 Coronary atherosclerosis due to lipid rich plaque: Secondary | ICD-10-CM | POA: Diagnosis not present

## 2024-03-07 DIAGNOSIS — E785 Hyperlipidemia, unspecified: Secondary | ICD-10-CM | POA: Diagnosis not present

## 2024-03-07 DIAGNOSIS — N186 End stage renal disease: Secondary | ICD-10-CM | POA: Diagnosis not present

## 2024-03-07 DIAGNOSIS — I10 Essential (primary) hypertension: Secondary | ICD-10-CM

## 2024-03-07 DIAGNOSIS — Z992 Dependence on renal dialysis: Secondary | ICD-10-CM

## 2024-03-07 DIAGNOSIS — Z0000 Encounter for general adult medical examination without abnormal findings: Secondary | ICD-10-CM

## 2024-03-07 DIAGNOSIS — N179 Acute kidney failure, unspecified: Secondary | ICD-10-CM

## 2024-03-07 LAB — COMPREHENSIVE METABOLIC PANEL WITH GFR
ALT: 7 U/L (ref 0–53)
AST: 12 U/L (ref 0–37)
Albumin: 4.5 g/dL (ref 3.5–5.2)
Alkaline Phosphatase: 107 U/L (ref 39–117)
BUN: 29 mg/dL — ABNORMAL HIGH (ref 6–23)
CO2: 31 meq/L (ref 19–32)
Calcium: 9.7 mg/dL (ref 8.4–10.5)
Chloride: 95 meq/L — ABNORMAL LOW (ref 96–112)
Creatinine, Ser: 7.13 mg/dL (ref 0.40–1.50)
GFR: 6.41 mL/min — CL (ref >60.00)
Glucose, Bld: 141 mg/dL — ABNORMAL HIGH (ref 70–99)
Potassium: 3.9 meq/L (ref 3.5–5.1)
Sodium: 138 meq/L (ref 135–145)
Total Bilirubin: 0.5 mg/dL (ref 0.2–1.2)
Total Protein: 7.9 g/dL (ref 6.0–8.3)

## 2024-03-07 LAB — LIPID PANEL
Cholesterol: 121 mg/dL (ref 0–200)
HDL: 46.6 mg/dL (ref >39.00)
LDL Cholesterol: 53 mg/dL (ref 0–99)
NonHDL: 74.49
Total CHOL/HDL Ratio: 3
Triglycerides: 109 mg/dL (ref 0.0–149.0)
VLDL: 21.8 mg/dL (ref 0.0–40.0)

## 2024-03-07 LAB — CBC
HCT: 33.3 % — ABNORMAL LOW (ref 39.0–52.0)
Hemoglobin: 10.8 g/dL — ABNORMAL LOW (ref 13.0–17.0)
MCHC: 32.3 g/dL (ref 30.0–36.0)
MCV: 87.6 fl (ref 78.0–100.0)
Platelets: 269 K/uL (ref 150.0–400.0)
RBC: 3.8 Mil/uL — ABNORMAL LOW (ref 4.22–5.81)
RDW: 16.1 % — ABNORMAL HIGH (ref 11.5–15.5)
WBC: 7.6 K/uL (ref 4.0–10.5)

## 2024-03-07 LAB — HEMOGLOBIN A1C: Hgb A1c MFr Bld: 7.6 % — ABNORMAL HIGH (ref 4.6–6.5)

## 2024-03-07 LAB — TSH: TSH: 1.23 u[IU]/mL (ref 0.35–5.50)

## 2024-03-07 MED ORDER — ATORVASTATIN CALCIUM 20 MG PO TABS: 20.0000 mg | ORAL_TABLET | Freq: Every day | ORAL | 3 refills | Status: DC

## 2024-03-07 MED ORDER — ESOMEPRAZOLE MAGNESIUM 40 MG PO CPDR: DELAYED_RELEASE_CAPSULE | ORAL | 3 refills | Status: AC

## 2024-03-07 NOTE — Progress Notes (Signed)
 Subjective:    Patient ID: Austin [name], male    DOB: April 09, 1935, 89 y.o.   MRN: [id number]  HPI Patient presents for yearly preventative medicine examination. He is a pleasant 89 year old male who  has a past medical history of Acute diastolic CHF (congestive heart failure) (HCC) (09/17/2020), Acute exacerbation of CHF (congestive heart failure) (HCC) (08/04/2019), ANEMIA DUE TO CHRONIC BLOOD LOSS (03/13/2007), CAROTID ARTERY STENOSIS (05/10/2010), CHF (congestive heart failure) (HCC), DIABETES MELLITUS, TYPE II (09/19/2007), DISEASE, CEREBROVASCULAR NEC (03/05/2007), GERD (03/13/2007), HYPERLIPIDEMIA (03/05/2007), HYPERTENSION (03/05/2007), HYPOKALEMIA (11/09/2009), KNEE PAIN, RIGHT (11/09/2009), PROSTATE CANCER, HX OF (03/05/2007), and RENAL DISEASE, CHRONIC (02/03/2009).  DM - He was on glipizide  5 mg ER daily but reports that  someone took me off that. He does monitor his blood sugar at home and reports that his blood pressures have been  good  Lab Results  Component Value Date   HGBA1C 7.4 (H) 10/25/2022   HGBA1C 6.1 (H) 09/27/2021   HGBA1C 6.3 (H) 07/09/2021   Hyperlipidemia -currently prescribed Lipitor 20 mg daily.  He denies fatigue or myalgia Lab Results  Component Value Date   CHOL 124 05/01/2019   HDL 42.70 05/01/2019   LDLCALC 63 05/01/2019   TRIG 90.0 05/01/2019   CHOLHDL 3 05/01/2019   Hypertension -prescribed Norvasc  10 mg daily, clonidine  0.3 MG TID, Cardura , Bidil  20-37.5 mg QID, and metoprolol  50 mg extended release daily.  He denies dizziness, lightheadedness, chest pain, shortness of breath, syncopal episode BP Readings from Last 3 Encounters:  03/07/24 120/60  01/26/24 (!) 168/58  11/23/23 (!) 110/50   Stage 5 chronic kidney disease on dialysis - he goes to dialysis M/W/F and reports that it is going well. He has to have a revision of his AV fistula at the end of the month     CAD - Followed by Dr. Levern.  Prescribed Plavix .  Last had Doppler ultrasound  carotids in 10/2017 which showed mild stenosis.  Had echo done in August 2029 showed left ventricle with normal systolic function, EF 55 to 60%.  Cavity size was mildly dilated and mildly increased left ventricular wall thickness.  He had mild mitral annular calcification present as well as mild calcification on aortic valve.  Mild aortic valve regurgitation   All immunizations and health maintenance protocols were reviewed with the patient and needed orders were placed.  Appropriate screening laboratory values were ordered for the patient including screening of hyperlipidemia, renal function and hepatic function. If indicated by BPH, a PSA was ordered.  Medication reconciliation,  past medical history, social history, problem list and allergies were reviewed in detail with the patient  Goals were established with regard to weight loss, exercise, and  diet in compliance with medications  Wt Readings from Last 3 Encounters:  03/07/24 172 lb (78 kg)  01/25/24 165 lb (74.8 kg)  11/23/23 176 lb (79.8 kg)    Review of Systems  Constitutional: Negative.   HENT: Negative.    Eyes: Negative.   Respiratory: Negative.    Cardiovascular: Negative.   Gastrointestinal: Negative.   Endocrine: Negative.   Genitourinary: Negative.   Musculoskeletal: Negative.   Skin: Negative.   Allergic/Immunologic: Negative.   Neurological: Negative.   Hematological: Negative.   Psychiatric/Behavioral: Negative.    All other systems reviewed and are negative.  Past Medical History:  Diagnosis Date   Acute diastolic CHF (congestive heart failure) (HCC) 09/17/2020   Acute exacerbation of CHF (congestive heart failure) (HCC) 08/04/2019   ANEMIA DUE  TO CHRONIC BLOOD LOSS 03/13/2007   CAROTID ARTERY STENOSIS 05/10/2010   CHF (congestive heart failure) (HCC)    DIABETES MELLITUS, TYPE II 09/19/2007   DISEASE, CEREBROVASCULAR NEC 03/05/2007   GERD 03/13/2007   HYPERLIPIDEMIA 03/05/2007   HYPERTENSION  03/05/2007   HYPOKALEMIA 11/09/2009   KNEE PAIN, RIGHT 11/09/2009   PROSTATE CANCER, HX OF 03/05/2007   RENAL DISEASE, CHRONIC 02/03/2009    Social History   Socioeconomic History   Marital status: Married    Spouse name: Not on file   Number of children: Not on file   Years of education: Not on file   Highest education level: Not on file  Occupational History   Not on file  Tobacco Use   Smoking status: Former    Current packs/day: 0.00    Average packs/day: 1 pack/day for 10.0 years (10.0 ttl pk-yrs)    Types: Cigarettes    Start date: 08/22/1964    Quit date: 08/22/1974    Years since quitting: 49.5   Smokeless tobacco: Never  Vaping Use   Vaping status: Never Used  Substance and Sexual Activity   Alcohol  use: No    Alcohol /week: 0.0 standard drinks of alcohol    Drug use: No   Sexual activity: Not Currently  Other Topics Concern   Not on file  Social History Narrative   Retired - Administrator, arts    Married 53 years       He enjoys traveling    Social Drivers of Corporate investment banker Strain: Low Risk  (02/09/2023)   Overall Financial Resource Strain (CARDIA)    Difficulty of Paying Living Expenses: Not very hard  Food Insecurity: No Food Insecurity (02/09/2023)   Hunger Vital Sign    Worried About Running Out of Food in the Last Year: Never true    Ran Out of Food in the Last Year: Never true  Transportation Needs: No Transportation Needs (07/27/2022)   PRAPARE - Administrator, Civil Service (Medical): No    Lack of Transportation (Non-Medical): No  Physical Activity: Insufficiently Active (02/09/2023)   Exercise Vital Sign    Days of Exercise per Week: 3 days    Minutes of Exercise per Session: 30 min  Stress: No Stress Concern Present (02/09/2023)   [employer] of Occupational Health - Occupational Stress Questionnaire    Feeling of Stress : Not at all  Social Connections: Moderately Integrated (02/09/2023)   Social Connection and  Isolation Panel    Frequency of Communication with Friends and Family: Twice a week    Frequency of Social Gatherings with Friends and Family: Never    Attends Religious Services: More than 4 times per year    Active Member of Clubs or Organizations: Yes    Attends Banker Meetings: More than 4 times per year    Marital Status: Married  Catering manager Violence: Not At Risk (02/10/2022)   Humiliation, Afraid, Rape, and Kick questionnaire    Fear of Current or Ex-Partner: No    Emotionally Abused: No    Physically Abused: No    Sexually Abused: No    Past Surgical History:  Procedure Laterality Date   AV FISTULA PLACEMENT Left 10/18/2022   Procedure: INSERTION OF LEFT ARM BRACHIAL ARTERY TO AXILLARY VEIN ARTERIOVENOUS (AV) GORE-TEX GRAFT;  Surgeon: Lanis Fonda BRAVO, MD;  Location: [facility] OR;  Service: Vascular;  Laterality: Left;   CAROTID ARTERY ANGIOPLASTY Right Oct. 10, 2001   ESOPHAGOGASTRODUODENOSCOPY (EGD) WITH PROPOFOL   N/A 11/04/2016   Procedure: ESOPHAGOGASTRODUODENOSCOPY (EGD) WITH PROPOFOL ;  Surgeon: Layla Lah, MD;  Location: [facility]COPY;  Service: Gastroenterology;  Laterality: N/A;   IR FLUORO GUIDE CV LINE RIGHT  10/25/2022   IR FLUORO GUIDE CV LINE RIGHT  10/27/2022   IR US  GUIDE VASC ACCESS RIGHT  10/25/2022   IR US  GUIDE VASC ACCESS RIGHT  10/27/2022   LAPAROSCOPIC APPENDECTOMY  02/20/2012   Procedure: APPENDECTOMY LAPAROSCOPIC;  Surgeon: Jina Nephew, MD;  Location: [facility] OR;  Service: General;  Laterality: N/A;   PROSTATE SURGERY     prostatectomy    Family History  Problem Relation Age of Onset   Hypertension Mother    Cancer Father        Mesothelioma    Stomach cancer Brother    Cancer Brother    Cancer - Cervical Brother    Cancer Brother    Diabetes Brother    Esophageal cancer Neg Hx    Colon cancer Neg Hx    Pancreatic cancer Neg Hx     Allergies  Allergen Reactions   Aspirin Other (See Comments)    High doses causes stomach ulcer and  bleeding    Current Outpatient Medications on File Prior to Visit  Medication Sig Dispense Refill   Accu-Chek Softclix Lancets lancets USED TO CHECK BLOOD GLUCOSE TWICE A DAY OR AS NEEDED 100 each 6   acetaminophen  (TYLENOL ) 500 MG tablet Take 1,000 mg by mouth every 4 (four) hours as needed.     albuterol  (VENTOLIN  HFA) 108 (90 Base) MCG/ACT inhaler TAKE 2 PUFFS BY MOUTH EVERY 6 HOURS AS NEEDED FOR WHEEZE OR SHORTNESS OF BREATH 8.5 each 5   amLODipine  (NORVASC ) 10 MG tablet TAKE 1 TABLET BY MOUTH EVERY DAY 90 tablet 3   atorvastatin  (LIPITOR) 20 MG tablet TAKE 1 TABLET BY MOUTH EVERY DAY 90 tablet 3   cholecalciferol  (VITAMIN D3) 25 MCG (1000 UNIT) tablet Take 1,000 Units by mouth daily.     cloNIDine  (CATAPRES ) 0.3 MG tablet Take 1 tablet (0.3 mg total) by mouth 3 (three) times daily. (Patient taking differently: Take 0.3 mg by mouth 2 (two) times daily.) 60 tablet 0   clopidogrel  (PLAVIX ) 75 MG tablet TAKE 1 TABLET BY MOUTH EVERY DAY 90 tablet 3   doxazosin  (CARDURA ) 2 MG tablet Take 2 mg by mouth at bedtime.   3   esomeprazole  (NEXIUM ) 40 MG capsule TAKE 1 CAPSULE BY MOUTH EVERY DAY 90 capsule 3   glucose blood (ACCU-CHEK GUIDE) test strip USE TO CHECK BLOOD GLUCOSE TWICE A DAY OR AS NEEDED 300 strip 1   guaiFENesin  (MUCINEX ) 600 MG 12 hr tablet Take 600 mg by mouth at bedtime.     isosorbide -hydrALAZINE  (BIDIL ) 20-37.5 MG tablet Take 1 tablet by mouth 3 (three) times daily.     lactulose  (CHRONULAC ) 10 GM/15ML solution TAKE 30 MLS (20 G TOTAL) BY MOUTH 2 (TWO) TIMES DAILY AS NEEDED. 237 mL 0   Melatonin 10 MG TABS Take 10 mg by mouth at bedtime.     Methoxy PEG-Epoetin  Beta (MIRCERA IJ) Mircera     methylcellulose (CITRUCEL) oral powder 1 tablespoon every 2-3 days,alternating with lactulose      metolazone  (ZAROXOLYN ) 5 MG tablet Take 5 mg by mouth daily as needed.     neomycin-polymyxin b-dexamethasone  (MAXITROL) 3.5-10000-0.1 SUSP Place 1 drop into both eyes 4 (four) times daily.      Propylene Glycol (SYSTANE BALANCE OP) Place 1 drop into both eyes at bedtime.  sevelamer carbonate (RENVELA) 800 MG tablet Take 800 mg by mouth 3 (three) times daily.     triamcinolone  cream (KENALOG ) 0.1 % Apply 1 application topically daily as needed (dry/irritated skin).     No current facility-administered medications on file prior to visit.    BP 120/60   Pulse 62   Temp 98.5 F (36.9 C) (Oral)   Ht 5' 7 (1.702 m)   Wt 172 lb (78 kg)   SpO2 97%   BMI 26.94 kg/m       Objective:   Physical Exam Vitals and nursing note reviewed.  Constitutional:      General: He is not in acute distress.    Appearance: Normal appearance. He is not ill-appearing.  HENT:     Head: Normocephalic and atraumatic.     Right Ear: Tympanic membrane, ear canal and external ear normal. There is no impacted cerumen.     Left Ear: Tympanic membrane, ear canal and external ear normal. There is no impacted cerumen.     Nose: Nose normal. No congestion or rhinorrhea.     Mouth/Throat:     Mouth: Mucous membranes are moist.     Pharynx: Oropharynx is clear.  Eyes:     Extraocular Movements: Extraocular movements intact.     Conjunctiva/sclera: Conjunctivae normal.     Pupils: Pupils are equal, round, and reactive to light.  Neck:     Vascular: No carotid bruit.  Cardiovascular:     Rate and Rhythm: Normal rate and regular rhythm.     Pulses: Normal pulses.     Heart sounds: No murmur heard.    No friction rub. No gallop.     Comments: AV fistula in left upper arm. + Bruit + Thrill  Pulmonary:     Effort: Pulmonary effort is normal.     Breath sounds: Normal breath sounds.  Abdominal:     General: Abdomen is flat. Bowel sounds are normal. There is no distension.     Palpations: Abdomen is soft. There is no mass.     Tenderness: There is no abdominal tenderness. There is no guarding or rebound.     Hernia: No hernia is present.  Musculoskeletal:        General: Normal range of motion.      Cervical back: Normal range of motion and neck supple.     Right lower leg: No edema.     Left lower leg: No edema.  Lymphadenopathy:     Cervical: No cervical adenopathy.  Skin:    General: Skin is warm and dry.     Capillary Refill: Capillary refill takes less than 2 seconds.  Neurological:     General: No focal deficit present.     Mental Status: He is alert and oriented to person, place, and time.  Psychiatric:        Mood and Affect: Mood normal.        Behavior: Behavior normal.        Thought Content: Thought content normal.        Judgment: Judgment normal.        Assessment & Plan:  1. Routine general medical examination at a health care facility (Primary) Today patient counseled on age appropriate routine health concerns for screening and prevention, each reviewed and up to date or declined. Immunizations reviewed and up to date or declined. Labs ordered and reviewed. Risk factors for depression reviewed and negative. Hearing function and visual acuity are intact. ADLs screened  and addressed as needed. Functional ability and level of safety reviewed and appropriate. Education, counseling and referrals performed based on assessed risks today. Patient provided with a copy of personalized plan for preventive services.   2. Diabetes mellitus treated with oral medication (HCC) - Consider going back on Glipizide  - Lipid panel; Future - TSH; Future - CBC; Future - Comprehensive metabolic panel with GFR; Future - Hemoglobin A1c; Future - Hemoglobin A1c - Comprehensive metabolic panel with GFR - CBC - TSH - Lipid panel  3. Hyperlipidemia associated with type 2 diabetes mellitus (HCC) - Continue with statin  - Lipid panel; Future - TSH; Future - CBC; Future - Comprehensive metabolic panel with GFR; Future - Comprehensive metabolic panel with GFR - CBC - TSH - Lipid panel  4. Essential hypertension - controlled. No change in therapy  - Lipid panel; Future - TSH;  Future - CBC; Future - Comprehensive metabolic panel with GFR; Future - Comprehensive metabolic panel with GFR - CBC - TSH - Lipid panel  5. Stage 5 chronic kidney disease on dialysis [facility]) - Per nephrology.  - Lipid panel; Future - TSH; Future - CBC; Future - Comprehensive metabolic panel with GFR; Future - Comprehensive metabolic panel with GFR - CBC - TSH - Lipid panel  6. Coronary artery disease due to lipid rich plaque - Continue with statin and Plavix  - Lipid panel; Future - TSH; Future - CBC; Future - Comprehensive metabolic panel with GFR; Future - Comprehensive metabolic panel with GFR - CBC - TSH - Lipid panel   Darleene Shape, NP

## 2024-03-07 NOTE — Patient Instructions (Signed)
 It was great seeing you today   We will follow up with you regarding your lab work   Please let me know if you need anything

## 2024-03-08 DIAGNOSIS — N2581 Secondary hyperparathyroidism of renal origin: Secondary | ICD-10-CM | POA: Diagnosis not present

## 2024-03-08 DIAGNOSIS — N186 End stage renal disease: Secondary | ICD-10-CM | POA: Diagnosis not present

## 2024-03-08 DIAGNOSIS — Z992 Dependence on renal dialysis: Secondary | ICD-10-CM | POA: Diagnosis not present

## 2024-03-11 DIAGNOSIS — Z992 Dependence on renal dialysis: Secondary | ICD-10-CM | POA: Diagnosis not present

## 2024-03-11 DIAGNOSIS — N2581 Secondary hyperparathyroidism of renal origin: Secondary | ICD-10-CM | POA: Diagnosis not present

## 2024-03-11 DIAGNOSIS — N186 End stage renal disease: Secondary | ICD-10-CM | POA: Diagnosis not present

## 2024-03-13 DIAGNOSIS — N186 End stage renal disease: Secondary | ICD-10-CM | POA: Diagnosis not present

## 2024-03-13 DIAGNOSIS — Z992 Dependence on renal dialysis: Secondary | ICD-10-CM | POA: Diagnosis not present

## 2024-03-13 DIAGNOSIS — N2581 Secondary hyperparathyroidism of renal origin: Secondary | ICD-10-CM | POA: Diagnosis not present

## 2024-03-14 ENCOUNTER — Other Ambulatory Visit: Payer: Self-pay | Admitting: Adult Health

## 2024-03-14 DIAGNOSIS — I351 Nonrheumatic aortic (valve) insufficiency: Secondary | ICD-10-CM | POA: Diagnosis not present

## 2024-03-14 DIAGNOSIS — I5032 Chronic diastolic (congestive) heart failure: Secondary | ICD-10-CM | POA: Diagnosis not present

## 2024-03-14 DIAGNOSIS — I350 Nonrheumatic aortic (valve) stenosis: Secondary | ICD-10-CM | POA: Diagnosis not present

## 2024-03-14 DIAGNOSIS — I10 Essential (primary) hypertension: Secondary | ICD-10-CM | POA: Diagnosis not present

## 2024-03-15 ENCOUNTER — Ambulatory Visit (HOSPITAL_COMMUNITY)
Admission: EM | Admit: 2024-03-15 | Discharge: 2024-03-15 | Disposition: A | Discharge status: Home / Self Care | Attending: Emergency Medicine | Admitting: Emergency Medicine

## 2024-03-15 ENCOUNTER — Encounter (HOSPITAL_COMMUNITY): Payer: Self-pay | Admitting: *Deleted

## 2024-03-15 DIAGNOSIS — R0982 Postnasal drip: Secondary | ICD-10-CM | POA: Diagnosis not present

## 2024-03-15 DIAGNOSIS — N186 End stage renal disease: Secondary | ICD-10-CM | POA: Diagnosis not present

## 2024-03-15 DIAGNOSIS — Z992 Dependence on renal dialysis: Secondary | ICD-10-CM | POA: Diagnosis not present

## 2024-03-15 DIAGNOSIS — N2581 Secondary hyperparathyroidism of renal origin: Secondary | ICD-10-CM | POA: Diagnosis not present

## 2024-03-15 MED ORDER — CETIRIZINE HCL 5 MG PO TABS: 5.0000 mg | ORAL_TABLET | Freq: Every evening | ORAL | 0 refills | Status: AC

## 2024-03-15 MED ORDER — FLUTICASONE PROPIONATE 50 MCG/ACT NA SUSP: 1.0000 | Freq: Every day | NASAL | 2 refills | Status: DC

## 2024-03-15 NOTE — ED Triage Notes (Signed)
 Pt states that he has had congestion since beginning on July. He wen to ENT on 02/27/2024 they advised him to get a nasal saline irrigation but he states he is scared to use one so he did not but one.

## 2024-03-15 NOTE — ED Provider Notes (Signed)
 [facility]    CSN: [id number] Arrival date & time: 03/15/24  1718     History   Chief Complaint Chief Complaint  Patient presents with   Nasal Congestion    HPI Austin [name] is a 89 y.o. male.  Here with several weeks of post-nasal drip, has to clear his throat but feels there is mucous there. When seen in the ED for headache previously, was found to have complete opacification of right sinus. Went to ENT who recommended nasal saline irrigation. He was worried this would be a choking hazard and has not tried it.  Denies fever or cough Not having sinus pressure, facial pain, or nasal congestion at this time He is overall feeling well  Had antibiotics for sinuses June 6th, Augmentin   He was just started on amoxicillin  for dental infection as well. One dose taken.   Dialysis M-W-F  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 BLOOD LOSS 03/13/2007   CAROTID ARTERY STENOSIS 05/10/2010   CHF (congestive heart failure) (HCC)    DIABETES MELLITUS, TYPE II 09/19/2007   DISEASE, CEREBROVASCULAR NEC 03/05/2007   GERD 03/13/2007   HYPERLIPIDEMIA 03/05/2007   HYPERTENSION 03/05/2007   HYPOKALEMIA 11/09/2009   KNEE PAIN, RIGHT 11/09/2009   PROSTATE CANCER, HX OF 03/05/2007   RENAL DISEASE, CHRONIC 02/03/2009    Patient Active Problem List   Diagnosis Date Noted   Preoperative evaluation of a medical condition to rule out surgical contraindications (TAR required) 05/04/2023   Right carpal tunnel syndrome 03/16/2023   ESRD on dialysis (HCC) 10/29/2022   Protein-calorie malnutrition, severe 10/28/2022   Pneumonia due to COVID-19 virus 10/25/2022   CKD (chronic kidney disease) stage 5, GFR less than 15 ml/min (HCC) 10/25/2022   Severe pulmonary hypertension (HCC) 10/25/2022   Moderate aortic regurgitation  10/25/2022   CAD in native artery 10/25/2022   UTI (urinary tract infection) 09/27/2021   CHF exacerbation (HCC) 09/26/2021   Acute respiratory failure with hypoxia (HCC) 09/26/2021   Acute on chronic diastolic CHF (congestive heart failure) (HCC) 07/09/2021   Dyspnea 06/01/2021   Upper airway cough syndrome 04/06/2021   Porokeratosis 03/26/2021   Pulmonary nodules 10/22/2020   Acute on chronic renal failure (HCC) 10/09/2020   Mass of right lung 09/18/2020   Acute kidney injury superimposed on CKD (HCC) 08/04/2019   Elevated troponin 08/04/2019   Hypokalemia 08/04/2019   Hypertensive urgency 03/28/2019   Elevated serum immunoglobulin free light chains 02/20/2019   Gastrointestinal hemorrhage with melena    Melena 11/03/2016   Carotid artery stenosis 05/10/2010   Type 2 diabetes mellitus with hyperlipidemia (HCC) 09/19/2007   Iron  deficiency anemia due to chronic blood loss 03/13/2007   GERD 03/13/2007   Essential hypertension 03/05/2007   DISEASE, CEREBROVASCULAR NEC 03/05/2007   PROSTATE CANCER, HX OF 03/05/2007    Past Surgical History:  Procedure Laterality Date   AV FISTULA PLACEMENT Left 10/18/2022   Procedure: INSERTION OF LEFT ARM BRACHIAL ARTERY TO AXILLARY VEIN ARTERIOVENOUS (AV) GORE-TEX GRAFT;  Surgeon: Lanis Fonda BRAVO, MD;  Location: [facility] OR;  Service: Vascular;  Laterality: Left;   CAROTID ARTERY ANGIOPLASTY Right Oct. 10, 2001   ESOPHAGOGASTRODUODENOSCOPY (EGD) WITH PROPOFOL  N/A 11/04/2016   Procedure: ESOPHAGOGASTRODUODENOSCOPY (EGD) WITH PROPOFOL ;  Surgeon: Layla Lah, MD;  [facility];  Service: Gastroenterology;  Laterality: N/A;   IR FLUORO GUIDE CV  LINE RIGHT  10/25/2022   IR FLUORO GUIDE CV LINE RIGHT  10/27/2022   IR US  GUIDE VASC ACCESS RIGHT  10/25/2022   IR US  GUIDE VASC ACCESS RIGHT  10/27/2022   LAPAROSCOPIC APPENDECTOMY  02/20/2012   Procedure: APPENDECTOMY LAPAROSCOPIC;  Surgeon: Jina Nephew, MD;  Location: [facility];  Service: General;   Laterality: N/A;   PROSTATE SURGERY     prostatectomy       Home Medications    Prior to Admission medications   Medication Sig Start Date End Date Taking? Authorizing Provider  amLODipine  (NORVASC ) 10 MG tablet TAKE 1 TABLET BY MOUTH EVERY DAY 12/05/23  Yes Nafziger, Darleene, NP  atorvastatin  (LIPITOR) 20 MG tablet Take 1 tablet (20 mg total) by mouth daily. 03/07/24  Yes Nafziger, Darleene, NP  cetirizine (ZYRTEC) 5 MG tablet Take 1 tablet (5 mg total) by mouth at bedtime. 03/15/24  Yes Shauntae Reitman, [place]berry, PA-C  cholecalciferol  (VITAMIN D3) 25 MCG (1000 UNIT) tablet Take 1,000 Units by mouth daily.   Yes [provider]  clopidogrel  (PLAVIX ) 75 MG tablet TAKE 1 TABLET BY MOUTH EVERY DAY 07/18/23  Yes Nafziger, Darleene, NP  doxazosin  (CARDURA ) 2 MG tablet Take 2 mg by mouth at bedtime.  12/28/17  Yes [provider]  esomeprazole  (NEXIUM ) 40 MG capsule TAKE 1 CAPSULE BY MOUTH EVERY DAY 03/07/24  Yes Nafziger, Darleene, NP  fluticasone (FLONASE) 50 MCG/ACT nasal spray Place 1 spray into both nostrils daily. 03/15/24  Yes Alper Guilmette, Asberry, PA-C  guaiFENesin  (MUCINEX ) 600 MG 12 hr tablet Take 600 mg by mouth at bedtime.   Yes [provider]  isosorbide -hydrALAZINE  (BIDIL ) 20-37.5 MG tablet Take 1 tablet by mouth 3 (three) times daily.   Yes [provider]  Melatonin 10 MG TABS Take 10 mg by mouth at bedtime.   Yes [provider]  Accu-Chek Softclix Lancets lancets USED TO CHECK BLOOD GLUCOSE TWICE A DAY OR AS NEEDED 06/08/23   Nafziger, Darleene, NP  acetaminophen  (TYLENOL ) 500 MG tablet Take 1,000 mg by mouth every 4 (four) hours as needed.    [provider]  albuterol  (VENTOLIN  HFA) 108 (90 Base) MCG/ACT inhaler TAKE 2 PUFFS BY MOUTH EVERY 6 HOURS AS NEEDED FOR WHEEZE OR SHORTNESS OF BREATH 09/13/22   Shelah Lamar RAMAN, MD  cloNIDine  (CATAPRES ) 0.3 MG tablet Take 1 tablet (0.3 mg total) by mouth 3 (three) times daily. Patient taking differently: Take 0.3 mg by  mouth 2 (two) times daily. 09/20/20   Segal, Jared E, MD  glucose blood (ACCU-CHEK GUIDE) test strip USE TO CHECK BLOOD GLUCOSE TWICE A DAY OR AS NEEDED 04/27/23   Nafziger, Darleene, NP  lactulose  (CHRONULAC ) 10 GM/15ML solution TAKE 30 ML BY MOUTH TWICE DAILY AS NEEDED 03/14/24   Nafziger, Darleene, NP  Methoxy PEG-Epoetin  Beta (MIRCERA IJ) Mircera 10/30/23 10/28/24  [provider]  methylcellulose (CITRUCEL) oral powder 1 tablespoon every 2-3 days,alternating with lactulose     [provider]  metolazone  (ZAROXOLYN ) 5 MG tablet Take 5 mg by mouth daily as needed. 12/01/22   [provider]  neomycin-polymyxin b-dexamethasone  (MAXITROL) 3.5-10000-0.1 SUSP Place 1 drop into both eyes 4 (four) times daily. 10/13/22   [provider]  Propylene Glycol (SYSTANE BALANCE OP) Place 1 drop into both eyes at bedtime.    [provider]  sevelamer carbonate (RENVELA) 800 MG tablet Take 800 mg by mouth 3 (three) times daily. 12/08/22   [provider]  triamcinolone  cream (KENALOG ) 0.1 % Apply 1  application topically daily as needed (dry/irritated skin). 07/05/19   [provider]    Family History Family History  Problem Relation Age of Onset   Hypertension Mother    Cancer Father        Mesothelioma    Stomach cancer Brother    Cancer Brother    Cancer - Cervical Brother    Cancer Brother    Diabetes Brother    Esophageal cancer Neg Hx    Colon cancer Neg Hx    Pancreatic cancer Neg Hx     Social History Social History   Tobacco Use   Smoking status: Former    Current packs/day: 0.00    Average packs/day: 1 pack/day for 10.0 years (10.0 ttl pk-yrs)    Types: Cigarettes    Start date: 08/22/1964    Quit date: 08/22/1974    Years since quitting: 49.5   Smokeless tobacco: Never  Vaping Use   Vaping status: Never Used  Substance Use Topics   Alcohol  use: No    Alcohol /week: 0.0 standard drinks of alcohol    Drug use: No     Allergies    Aspirin   Review of Systems Review of Systems As per HPI  Physical Exam Triage Vital Signs ED Triage Vitals  Encounter Vitals Group     BP 03/15/24 1821 (!) 177/74     Girls Systolic BP Percentile --      Girls Diastolic BP Percentile --      Boys Systolic BP Percentile --      Boys Diastolic BP Percentile --      Pulse Rate 03/15/24 1821 79     Resp 03/15/24 1821 18     Temp 03/15/24 1821 99.2 F (37.3 C)     Temp Source 03/15/24 1821 Oral     SpO2 03/15/24 1821 95 %     Weight --      Height --      Head Circumference --      Peak Flow --      Pain Score 03/15/24 1819 0     Pain Loc --      Pain Education --      Exclude from Growth Chart --    No data found.  Updated Vital Signs BP (!) 156/72   Pulse 79   Temp 98.6 F (37 C)   Resp 18   SpO2 98%    Physical Exam Vitals and nursing note reviewed.  Constitutional:      General: He is not in acute distress.    Appearance: Normal appearance.     Comments: Appears younger than stated age  HENT:     Right Ear: Tympanic membrane and ear canal normal.     Left Ear: Tympanic membrane and ear canal normal.     Nose: No congestion or rhinorrhea.     Mouth/Throat:     Mouth: Mucous membranes are moist.     Pharynx: Oropharynx is clear. No posterior oropharyngeal erythema.  Eyes:     Conjunctiva/sclera: Conjunctivae normal.  Cardiovascular:     Rate and Rhythm: Normal rate and regular rhythm.     Pulses: Normal pulses.     Heart sounds: Normal heart sounds.  Pulmonary:     Effort: Pulmonary effort is normal.     Breath sounds: Normal breath sounds.  Abdominal:     Palpations: Abdomen is soft.  Musculoskeletal:     Cervical back: Normal range of motion.  Lymphadenopathy:  Cervical: No cervical adenopathy.  Skin:    General: Skin is warm and dry.  Neurological:     Mental Status: He is alert and oriented to person, place, and time.     UC Treatments / Results  Labs (all labs ordered are listed,  but only abnormal results are displayed) Labs Reviewed - No data to display  EKG  Radiology No results found.  Procedures Procedures  Medications Ordered in UC Medications - No data to display  Initial Impression / Assessment and Plan / UC Course  I have reviewed the triage vital signs and the nursing notes.  Pertinent labs & imaging results that were available during my care of the patient were reviewed by me and considered in my medical decision making (see chart for details).  No red flags Post nasal drip I recommend half dose zyrtec nightly (5 mg), try flonase nasal spray, continue follow up with ENT. No antibiotics are indicated at this time Can return to clinic with any concerns, or call his ENT for further recommendation. Agrees to plan, no questions at this time   Final Clinical Impressions(s) / UC Diagnoses   Final diagnoses:  Post-nasal drip     Discharge Instructions      I recommend to start 5 mg zyrtec at night time. Use for the next 4-5 nights in a row. Also try nasal spray flonase. 1 spray in each nostril daily. Also try this for a few days and see how you feel. Follow up with your ENT if symptoms persist      ED Prescriptions     Medication Sig Dispense Auth. Provider   cetirizine (ZYRTEC) 5 MG tablet Take 1 tablet (5 mg total) by mouth at bedtime. 60 tablet Mithran Strike, PA-C   fluticasone (FLONASE) 50 MCG/ACT nasal spray Place 1 spray into both nostrils daily. 9.9 mL Marquisa Salih, Asberry, PA-C      PDMP not reviewed this encounter.   Ahan Eisenberger, PA-C 03/15/24 2122

## 2024-03-15 NOTE — Discharge Instructions (Signed)
 I recommend to start 5 mg zyrtec at night time. Use for the next 4-5 nights in a row. Also try nasal spray flonase. 1 spray in each nostril daily. Also try this for a few days and see how you feel. Follow up with your ENT if symptoms persist

## 2024-03-18 DIAGNOSIS — Z992 Dependence on renal dialysis: Secondary | ICD-10-CM | POA: Diagnosis not present

## 2024-03-18 DIAGNOSIS — N186 End stage renal disease: Secondary | ICD-10-CM | POA: Diagnosis not present

## 2024-03-18 DIAGNOSIS — N2581 Secondary hyperparathyroidism of renal origin: Secondary | ICD-10-CM | POA: Diagnosis not present

## 2024-03-19 DIAGNOSIS — H0100A Unspecified blepharitis right eye, upper and lower eyelids: Secondary | ICD-10-CM | POA: Diagnosis not present

## 2024-03-19 DIAGNOSIS — H04121 Dry eye syndrome of right lacrimal gland: Secondary | ICD-10-CM | POA: Diagnosis not present

## 2024-03-20 DIAGNOSIS — Z992 Dependence on renal dialysis: Secondary | ICD-10-CM | POA: Diagnosis not present

## 2024-03-20 DIAGNOSIS — N186 End stage renal disease: Secondary | ICD-10-CM | POA: Diagnosis not present

## 2024-03-20 DIAGNOSIS — N2581 Secondary hyperparathyroidism of renal origin: Secondary | ICD-10-CM | POA: Diagnosis not present

## 2024-03-21 ENCOUNTER — Other Ambulatory Visit: Payer: Self-pay

## 2024-03-21 ENCOUNTER — Ambulatory Visit (HOSPITAL_COMMUNITY)
Admission: RE | Admit: 2024-03-21 | Discharge: 2024-03-21 | Disposition: A | Discharge status: Home / Self Care | Attending: Vascular Surgery | Admitting: Vascular Surgery

## 2024-03-21 ENCOUNTER — Encounter (HOSPITAL_COMMUNITY)
Admission: RE | Disposition: A | Discharge status: Home / Self Care | Payer: Self-pay | Source: Home / Self Care | Attending: Vascular Surgery

## 2024-03-21 DIAGNOSIS — T82898A Other specified complication of vascular prosthetic devices, implants and grafts, initial encounter: Secondary | ICD-10-CM | POA: Insufficient documentation

## 2024-03-21 DIAGNOSIS — Y832 Surgical operation with anastomosis, bypass or graft as the cause of abnormal reaction of the patient, or of later complication, without mention of misadventure at the time of the procedure: Secondary | ICD-10-CM | POA: Diagnosis not present

## 2024-03-21 DIAGNOSIS — I5032 Chronic diastolic (congestive) heart failure: Secondary | ICD-10-CM | POA: Diagnosis not present

## 2024-03-21 DIAGNOSIS — Z87891 Personal history of nicotine dependence: Secondary | ICD-10-CM | POA: Insufficient documentation

## 2024-03-21 DIAGNOSIS — E1122 Type 2 diabetes mellitus with diabetic chronic kidney disease: Secondary | ICD-10-CM | POA: Insufficient documentation

## 2024-03-21 DIAGNOSIS — Z992 Dependence on renal dialysis: Secondary | ICD-10-CM | POA: Insufficient documentation

## 2024-03-21 DIAGNOSIS — N186 End stage renal disease: Secondary | ICD-10-CM | POA: Insufficient documentation

## 2024-03-21 DIAGNOSIS — I132 Hypertensive heart and chronic kidney disease with heart failure and with stage 5 chronic kidney disease, or end stage renal disease: Secondary | ICD-10-CM | POA: Diagnosis not present

## 2024-03-21 DIAGNOSIS — T82590A Other mechanical complication of surgically created arteriovenous fistula, initial encounter: Secondary | ICD-10-CM | POA: Diagnosis not present

## 2024-03-21 DIAGNOSIS — E1022 Type 1 diabetes mellitus with diabetic chronic kidney disease: Secondary | ICD-10-CM | POA: Diagnosis not present

## 2024-03-21 HISTORY — PX: A/V FISTULAGRAM: CATH118298

## 2024-03-21 LAB — GLUCOSE, CAPILLARY: Glucose-Capillary: 127 mg/dL — ABNORMAL HIGH (ref 70–99)

## 2024-03-21 SURGERY — A/V FISTULAGRAM
Anesthesia: LOCAL

## 2024-03-21 MED ORDER — LIDOCAINE HCL (PF) 1 % IJ SOLN
INTRAMUSCULAR | Status: DC | PRN
  Administered 2024-03-21: 5 mL

## 2024-03-21 MED ORDER — IODIXANOL 320 MG/ML IV SOLN
INTRAVENOUS | Status: DC | PRN
  Administered 2024-03-21: 20 mL via INTRAVENOUS

## 2024-03-21 MED ORDER — HEPARIN (PORCINE) IN NACL 1000-0.9 UT/500ML-% IV SOLN
INTRAVENOUS | Status: DC | PRN
  Administered 2024-03-21: 500 mL

## 2024-03-21 MED ORDER — LIDOCAINE HCL (PF) 1 % IJ SOLN
INTRAMUSCULAR | Status: AC
Start: 2024-03-21 — End: 2024-03-21
  Filled 2024-03-21: qty 30

## 2024-03-21 SURGICAL SUPPLY — 4 items
KIT MICROPUNCTURE NIT STIFF (SHEATH) ×1 IMPLANT
SHEATH PROBE COVER 6X72 (BAG) ×1 IMPLANT
TRAY PV CATH (CUSTOM PROCEDURE TRAY) ×3 IMPLANT
TUBING CIL FLEX 10 FLL-RA (TUBING) ×1 IMPLANT

## 2024-03-21 NOTE — Op Note (Signed)
 DATE OF SERVICE: 03/21/2024  PATIENT:  Austin [name]  89 y.o. male  PRE-OPERATIVE DIAGNOSIS:  end-stage renal disease; poorly functioning AVG  POST-OPERATIVE DIAGNOSIS:  Same  PROCEDURE:   1) Ultrasound guided left arm AVG access (CPT [phone]) 2) Left arm fistulagram (CPT [phone]) 3) established outpatient evaluation and management - level 3 (CPT 99213)  SURGEON:  Debby SAILOR. Magda, MD  ASSISTANT: none  ANESTHESIA:   local  ESTIMATED BLOOD LOSS: min  LOCAL MEDICATIONS USED:  LIDOCAINE    COUNTS: confirmed correct.  PATIENT DISPOSITION:  PACU - hemodynamically stable.   Delay start of Pharmacological VTE agent (>24hrs) due to surgical blood loss or risk of bleeding: no  INDICATION FOR PROCEDURE: Austin [name] is a 89 y.o. male with ESRD. His graft is not performing well at dialysis. After careful discussion of risks, benefits, and alternatives the patient was offered fistulagram. The patient understood and wished to proceed.  OPERATIVE FINDINGS:  Left Upper Extremity Central venous: no stenosis Subclavian vein: no stenosis Axillary vein: no stenosis Venous anastomosis: no stenosis Graft: mild irregularity consistent with cannulation. No clear stenosis. Arterial anastomosis: no stenosis  DESCRIPTION OF PROCEDURE: After identification of the patient in the pre-operative holding area, the patient was transferred to the operating room. The patient was positioned supine on the operating room table.  The left extremity was prepped and draped in standard fashion. A surgical pause was performed confirming correct patient, procedure, and operative location.  The left extremity was anesthetized with subcutaneous injection of 1% lidocaine  over the area of planned access. Using ultrasound guidance, the left dialysis access was accessed with micropuncture technique.  Fistulogram was performed in stations with the micro sheath.  See above for details.  All endovascular equipment was removed.  A  figure-of-eight stitch was applied to the exit site with good hemostasis.  Sterile bandage was applied.  Upon completion of the case instrument and sharps counts were confirmed correct. The patient was transferred to the PACU in good condition. I was present for all portions of the procedure.  PLAN: Ok to use graft. Graft remains amenable to percutaneous intervention.   Debby SAILOR. Magda, MD [facility] Phone Number: [phone] 03/21/2024 12:01 PM

## 2024-03-21 NOTE — H&P (Signed)
 [facility] [place]  ASSESSMENT / PLAN: 89 y.o. male with ESRD, dialyzing through left upper arm AVG. Graft is not performing well at dialysis. Plan fistulagram today in cath lab.   CHIEF COMPLAINT: poorly functioning graft  HISTORY OF PRESENT ILLNESS: Austin [name] is a 89 y.o. male with end-stage renal disease dialyzing through a left arm AV graft.  At dialysis, the patient reports the graft has had poor performance with low flows.  Fistulogram was requested.  The patient has no complaints today.  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 BLOOD LOSS 03/13/2007   CAROTID ARTERY STENOSIS 05/10/2010   CHF (congestive heart failure) (HCC)    DIABETES MELLITUS, TYPE II 09/19/2007   DISEASE, CEREBROVASCULAR NEC 03/05/2007   GERD 03/13/2007   HYPERLIPIDEMIA 03/05/2007   HYPERTENSION 03/05/2007   HYPOKALEMIA 11/09/2009   KNEE PAIN, RIGHT 11/09/2009   PROSTATE CANCER, HX OF 03/05/2007   RENAL DISEASE, CHRONIC 02/03/2009    Past Surgical History:  Procedure Laterality Date   AV FISTULA PLACEMENT Left 10/18/2022   Procedure: INSERTION OF LEFT ARM BRACHIAL ARTERY TO AXILLARY VEIN ARTERIOVENOUS (AV) GORE-TEX GRAFT;  Surgeon: Lanis Fonda BRAVO, MD;  Location: [facility] OR;  Service: Vascular;  Laterality: Left;   CAROTID ARTERY ANGIOPLASTY Right Oct. 10, 2001   ESOPHAGOGASTRODUODENOSCOPY (EGD) WITH PROPOFOL  N/A 11/04/2016   Procedure: ESOPHAGOGASTRODUODENOSCOPY (EGD) WITH PROPOFOL ;  Surgeon: Layla Lah, MD;  Location: [facility];  Service: Gastroenterology;  Laterality: N/A;   IR FLUORO GUIDE CV LINE RIGHT  10/25/2022   IR FLUORO GUIDE CV LINE RIGHT  10/27/2022   IR US  GUIDE VASC ACCESS RIGHT  10/25/2022   IR US  GUIDE VASC ACCESS RIGHT  10/27/2022   LAPAROSCOPIC APPENDECTOMY  02/20/2012   Procedure: APPENDECTOMY  LAPAROSCOPIC;  Surgeon: Jina Nephew, MD;  Location: [facility];  Service: General;  Laterality: N/A;   PROSTATE SURGERY     prostatectomy    Family History  Problem Relation Age of Onset   Hypertension Mother    Cancer Father        Mesothelioma    Stomach cancer Brother    Cancer Brother    Cancer - Cervical Brother    Cancer Brother    Diabetes Brother    Esophageal cancer Neg Hx    Colon cancer Neg Hx    Pancreatic cancer Neg Hx     Social History   Socioeconomic History   Marital status: Married    Spouse name: Not on file   Number of children: Not on file   Years of education: Not on file   Highest education level: Not on file  Occupational History   Not on file  Tobacco Use   Smoking status: Former    Current packs/day: 0.00    Average packs/day: 1 pack/day for 10.0 years (10.0 ttl pk-yrs)    Types: Cigarettes    Start date: 08/22/1964    Quit date: 08/22/1974    Years since quitting: 49.6   Smokeless tobacco: Never  Vaping Use   Vaping status: Never Used  Substance and Sexual Activity   Alcohol  use: No    Alcohol /week: 0.0 standard drinks of alcohol    Drug use: No   Sexual activity: Not Currently  Other Topics Concern   Not on file  Social History Narrative   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       He enjoys traveling    Social Drivers of Corporate investment banker Strain: Low Risk  (02/09/2023)   Overall Financial Resource Strain (CARDIA)    Difficulty of Paying Living Expenses: Not very hard  Food Insecurity: No Food Insecurity (02/09/2023)   Hunger Vital Sign    Worried About Running Out of Food in the Last Year: Never true    Ran Out of Food in the Last Year: Never true  Transportation Needs: No Transportation Needs (07/27/2022)   PRAPARE - Administrator, Civil Service (Medical): No    Lack of Transportation (Non-Medical): No  Physical Activity: Insufficiently Active (02/09/2023)   Exercise Vital Sign    Days of Exercise per  Week: 3 days    Minutes of Exercise per Session: 30 min  Stress: No Stress Concern Present (02/09/2023)   [employer] of Occupational Health - Occupational Stress Questionnaire    Feeling of Stress : Not at all  Social Connections: Moderately Integrated (02/09/2023)   Social Connection and Isolation Panel    Frequency of Communication with Friends and Family: Twice a week    Frequency of Social Gatherings with Friends and Family: Never    Attends Religious Services: More than 4 times per year    Active Member of [employer] or Organizations: Yes    Attends Engineer, structural: More than 4 times per year    Marital Status: Married  Catering manager Violence: Not At Risk (02/10/2022)   Humiliation, Afraid, Rape, and Kick questionnaire    Fear of Current or Ex-Partner: No    Emotionally Abused: No    Physically Abused: No    Sexually Abused: No    Allergies  Allergen Reactions   Aspirin Other (See Comments)    High doses causes stomach ulcer and bleeding    No current facility-administered medications for this encounter.    PHYSICAL EXAM Vitals:   03/21/24 1019 03/21/24 1027  BP: (!) 143/58 (!) 143/58  Pulse: 71 64  Resp: 12 14  Temp: 97.8 F (36.6 C)   TempSrc: Oral   SpO2: 96% 98%   Elderly man in no distress Regular rate and rhythm Unlabored breathing Left arm AV graft with weak thrill  PERTINENT LABORATORY AND RADIOLOGIC DATA  Most recent CBC    Latest Ref Rng & Units 03/07/2024   10:52 AM 01/25/2024    3:20 PM 12/27/2022    2:12 PM  CBC  WBC 4.0 - 10.5 K/uL 7.6  6.5  7.0   Hemoglobin 13.0 - 17.0 g/dL 89.1  88.8  89.5 Repeated and verified X2.   Hematocrit 39.0 - 52.0 % 33.3  34.9  32.3 Repeated and verified X2.   Platelets 150.0 - 400.0 K/uL 269.0  256  182.0      Most recent CMP    Latest Ref Rng & Units 03/07/2024   10:52 AM 01/25/2024    3:20 PM 12/27/2022    2:12 PM  CMP  Glucose 70 - 99 mg/dL 858  893  889   BUN 6 - 23 mg/dL 29  34  30    Creatinine 0.40 - 1.50 mg/dL 2.86  1.95  5.21   Sodium 135 - 145 mEq/L 138  137  142   Potassium 3.5 - 5.1 mEq/L 3.9  4.2  4.4   Chloride 96 - 112 mEq/L 95  94  98   CO2 19 - 32 mEq/L 31  27  34   Calcium  8.4 - 10.5 mg/dL 9.7  8.9  9.4   Total Protein 6.0 - 8.3 g/dL 7.9   7.1   Total Bilirubin 0.2 - 1.2 mg/dL 0.5   0.4   Alkaline Phos 39 - 117 U/L 107   82   AST 0 - 37 U/L 12   13   ALT 0 - 53 U/L 7   6     Renal function CrCl cannot be calculated (Unknown ideal weight.).  Hgb A1c MFr Bld (%)  Date Value  03/07/2024 7.6 (H)    LDL Cholesterol  Date Value Ref Range Status  03/07/2024 53 0 - 99 mg/dL Final    Debby SAILOR. Magda, MD FACS [facility] Phone Number: [phone] 03/21/2024 10:37 AM   Total time spent on preparing this encounter including chart review, data review, collecting history, examining the patient, and coordinating care: 30 minutes  Portions of this report may have been transcribed using voice recognition software.  Every effort has been made to ensure accuracy; however, inadvertent computerized transcription errors may still be present.

## 2024-03-22 ENCOUNTER — Emergency Department (HOSPITAL_BASED_OUTPATIENT_CLINIC_OR_DEPARTMENT_OTHER)

## 2024-03-22 ENCOUNTER — Other Ambulatory Visit: Payer: Self-pay

## 2024-03-22 ENCOUNTER — Encounter (HOSPITAL_COMMUNITY): Payer: Self-pay | Admitting: Vascular Surgery

## 2024-03-22 ENCOUNTER — Emergency Department (HOSPITAL_BASED_OUTPATIENT_CLINIC_OR_DEPARTMENT_OTHER)
Admission: EM | Admit: 2024-03-22 | Discharge: 2024-03-22 | Disposition: A | Discharge status: Home / Self Care | Attending: Emergency Medicine | Admitting: Emergency Medicine

## 2024-03-22 DIAGNOSIS — N2581 Secondary hyperparathyroidism of renal origin: Secondary | ICD-10-CM | POA: Diagnosis not present

## 2024-03-22 DIAGNOSIS — E1122 Type 2 diabetes mellitus with diabetic chronic kidney disease: Secondary | ICD-10-CM | POA: Diagnosis not present

## 2024-03-22 DIAGNOSIS — R0981 Nasal congestion: Secondary | ICD-10-CM | POA: Insufficient documentation

## 2024-03-22 DIAGNOSIS — I132 Hypertensive heart and chronic kidney disease with heart failure and with stage 5 chronic kidney disease, or end stage renal disease: Secondary | ICD-10-CM | POA: Diagnosis not present

## 2024-03-22 DIAGNOSIS — Z7902 Long term (current) use of antithrombotics/antiplatelets: Secondary | ICD-10-CM | POA: Diagnosis not present

## 2024-03-22 DIAGNOSIS — N186 End stage renal disease: Secondary | ICD-10-CM | POA: Insufficient documentation

## 2024-03-22 DIAGNOSIS — Z992 Dependence on renal dialysis: Secondary | ICD-10-CM | POA: Diagnosis not present

## 2024-03-22 DIAGNOSIS — R0602 Shortness of breath: Secondary | ICD-10-CM | POA: Diagnosis not present

## 2024-03-22 DIAGNOSIS — R918 Other nonspecific abnormal finding of lung field: Secondary | ICD-10-CM | POA: Diagnosis not present

## 2024-03-22 DIAGNOSIS — Z7984 Long term (current) use of oral hypoglycemic drugs: Secondary | ICD-10-CM | POA: Insufficient documentation

## 2024-03-22 DIAGNOSIS — R0989 Other specified symptoms and signs involving the circulatory and respiratory systems: Secondary | ICD-10-CM | POA: Diagnosis not present

## 2024-03-22 DIAGNOSIS — I509 Heart failure, unspecified: Secondary | ICD-10-CM | POA: Insufficient documentation

## 2024-03-22 DIAGNOSIS — Z79899 Other long term (current) drug therapy: Secondary | ICD-10-CM | POA: Insufficient documentation

## 2024-03-22 LAB — CBC WITH DIFFERENTIAL/PLATELET
Abs Immature Granulocytes: 0.04 K/uL (ref 0.00–0.07)
Basophils Absolute: 0 K/uL (ref 0.0–0.1)
Basophils Relative: 0 %
Eosinophils Absolute: 0.1 K/uL (ref 0.0–0.5)
Eosinophils Relative: 1 %
HCT: 33.8 % — ABNORMAL LOW (ref 39.0–52.0)
Hemoglobin: 10.8 g/dL — ABNORMAL LOW (ref 13.0–17.0)
Immature Granulocytes: 1 %
Lymphocytes Relative: 9 %
Lymphs Abs: 0.8 K/uL (ref 0.7–4.0)
MCH: 28.9 pg (ref 26.0–34.0)
MCHC: 32 g/dL (ref 30.0–36.0)
MCV: 90.4 fL (ref 80.0–100.0)
Monocytes Absolute: 0.6 K/uL (ref 0.1–1.0)
Monocytes Relative: 7 %
Neutro Abs: 7.1 K/uL (ref 1.7–7.7)
Neutrophils Relative %: 82 %
Platelets: 301 K/uL (ref 150–400)
RBC: 3.74 MIL/uL — ABNORMAL LOW (ref 4.22–5.81)
RDW: 17.2 % — ABNORMAL HIGH (ref 11.5–15.5)
WBC: 8.7 K/uL (ref 4.0–10.5)
nRBC: 0 % (ref 0.0–0.2)

## 2024-03-22 LAB — BASIC METABOLIC PANEL WITH GFR
Anion gap: 18 — ABNORMAL HIGH (ref 5–15)
BUN: 18 mg/dL (ref 8–23)
CO2: 28 mmol/L (ref 22–32)
Calcium: 9.3 mg/dL (ref 8.9–10.3)
Chloride: 95 mmol/L — ABNORMAL LOW (ref 98–111)
Creatinine, Ser: 5.22 mg/dL — ABNORMAL HIGH (ref 0.61–1.24)
GFR, Estimated: 10 mL/min — ABNORMAL LOW (ref >60)
Glucose, Bld: 214 mg/dL — ABNORMAL HIGH (ref 70–99)
Potassium: 3.4 mmol/L — ABNORMAL LOW (ref 3.5–5.1)
Sodium: 140 mmol/L (ref 135–145)

## 2024-03-22 MED ORDER — PREDNISONE 20 MG PO TABS: 20.0000 mg | ORAL_TABLET | Freq: Every day | ORAL | 0 refills | Status: DC

## 2024-03-22 NOTE — ED Triage Notes (Signed)
 Pt reports going to UC last week on 7/25 for congestion and was advised by ENT to use saline irrigation for congestion. Pt denies any cough. Pt reports feeling more SOB due to congestion

## 2024-03-22 NOTE — ED Provider Notes (Signed)
 [place] EMERGENCY DEPARTMENT AT [facility] Provider Note   CSN: [id number] Arrival date & time: 03/22/24  1840     Patient presents with: Nasal Congestion (/) and Shortness of Breath   Austin [name] is a 89 y.o. male.   Patient is an 89 year old with a history of end-stage renal disease on dialysis, CHF, hypertension, hyperlipidemia, diabetes but no longer on medications following with his PCP who is presenting today with ongoing nasal congestion and feeling like there is something going down his throat that he cannot cough up.  He has seen urgent care and reports that he had seen ENT in the past.  Had imaging at 1 point showing opacification of the sinuses.  He has been taking cetirizine  in the evening and a nasal spray but does not feel like it is helping.  He saw an ENT who he reports told him to do a sinus rinse but he was concerned it would cause him to choke so he has not done it.  He has not had fever but reports he feels short of breath when he tries to breathe out of his nose.  Does not describe significant exertional dyspnea or orthopnea.  He has been compliant with his dialysis and last dialyzed today.  He has been leaving out his dry weight.  The history is provided by the patient and medical records.  Shortness of Breath      Prior to Admission medications   Medication Sig Start Date End Date Taking? Authorizing Provider  Accu-Chek Softclix Lancets lancets USED TO CHECK BLOOD GLUCOSE TWICE A DAY OR AS NEEDED 06/08/23   Nafziger, Darleene, NP  acetaminophen  (TYLENOL ) 500 MG tablet Take 1,000 mg by mouth every 4 (four) hours as needed.    [provider]  albuterol  (VENTOLIN  HFA) 108 (90 Base) MCG/ACT inhaler TAKE 2 PUFFS BY MOUTH EVERY 6 HOURS AS NEEDED FOR WHEEZE OR SHORTNESS OF BREATH 09/13/22   Shelah Lamar RAMAN, MD  amLODipine  (NORVASC ) 10 MG tablet TAKE 1 TABLET BY MOUTH EVERY DAY 12/05/23   Nafziger, Darleene, NP  atorvastatin  (LIPITOR) 20 MG tablet Take 1 tablet  (20 mg total) by mouth daily. 03/07/24   Nafziger, Darleene, NP  cetirizine  (ZYRTEC ) 5 MG tablet Take 1 tablet (5 mg total) by mouth at bedtime. 03/15/24   Rising, Asberry, PA-C  cholecalciferol  (VITAMIN D3) 25 MCG (1000 UNIT) tablet Take 1,000 Units by mouth daily.    [provider]  cloNIDine  (CATAPRES ) 0.3 MG tablet Take 1 tablet (0.3 mg total) by mouth 3 (three) times daily. Patient taking differently: Take 0.3 mg by mouth 2 (two) times daily. 09/20/20   Segal, Jared E, MD  clopidogrel  (PLAVIX ) 75 MG tablet TAKE 1 TABLET BY MOUTH EVERY DAY 07/18/23   Nafziger, Darleene, NP  doxazosin  (CARDURA ) 2 MG tablet Take 2 mg by mouth at bedtime.  12/28/17   [provider]  esomeprazole  (NEXIUM ) 40 MG capsule TAKE 1 CAPSULE BY MOUTH EVERY DAY 03/07/24   Nafziger, Darleene, NP  fluticasone  (FLONASE ) 50 MCG/ACT nasal spray Place 1 spray into both nostrils daily. 03/15/24   Rising, Rebecca, PA-C  glucose blood (ACCU-CHEK GUIDE) test strip USE TO CHECK BLOOD GLUCOSE TWICE A DAY OR AS NEEDED 04/27/23   Nafziger, Darleene, NP  guaiFENesin  (MUCINEX ) 600 MG 12 hr tablet Take 600 mg by mouth at bedtime.    [provider]  isosorbide -hydrALAZINE  (BIDIL ) 20-37.5 MG tablet Take 1 tablet by mouth 3 (three) times daily.    [provider]  lactulose  (CHRONULAC ) 10 GM/15ML solution TAKE 30 ML BY MOUTH TWICE DAILY AS NEEDED 03/14/24   Nafziger, Darleene, NP  Melatonin 10 MG TABS Take 10 mg by mouth at bedtime.    [provider]  Methoxy PEG-Epoetin  Beta (MIRCERA IJ) Mircera 10/30/23 10/28/24  [provider]  methylcellulose (CITRUCEL) oral powder 1 tablespoon every 2-3 days,alternating with lactulose     [provider]  metolazone  (ZAROXOLYN ) 5 MG tablet Take 5 mg by mouth daily as needed. 12/01/22   [provider]  neomycin-polymyxin b-dexamethasone  (MAXITROL) 3.5-10000-0.1 SUSP Place 1 drop into both eyes 4 (four) times daily. 10/13/22   [provider]  predniSONE   (DELTASONE ) 20 MG tablet Take 1 tablet (20 mg total) by mouth daily. 03/22/24  Yes Jamian Andujo, Benton, MD  Propylene Glycol (SYSTANE BALANCE OP) Place 1 drop into both eyes at bedtime.    [provider]  sevelamer carbonate (RENVELA) 800 MG tablet Take 800 mg by mouth 3 (three) times daily. 12/08/22   [provider]  triamcinolone  cream (KENALOG ) 0.1 % Apply 1 application topically daily as needed (dry/irritated skin). 07/05/19   [provider]    Allergies: Aspirin    Review of Systems  Respiratory:  Positive for shortness of breath.     Updated Vital Signs BP (!) 164/66 (BP Location: Right Arm)   Pulse 88   Temp 98.7 F (37.1 C) (Oral)   Resp 18   Ht 5' 7 (1.702 m)   Wt 82 kg   SpO2 97%   BMI 28.31 kg/m   Physical Exam Vitals and nursing note reviewed.  Constitutional:      General: He is not in acute distress.    Appearance: He is well-developed.  HENT:     Head: Normocephalic and atraumatic.     Right Ear: There is impacted cerumen.     Left Ear: Tympanic membrane normal.     Nose: Congestion present.     Comments: Minimal nasal turbinate swelling    Mouth/Throat:     Pharynx: Oropharynx is clear. Uvula midline. No pharyngeal swelling.  Eyes:     Conjunctiva/sclera: Conjunctivae normal.     Pupils: Pupils are equal, round, and reactive to light.  Cardiovascular:     Rate and Rhythm: Normal rate and regular rhythm.     Heart sounds: Murmur heard.  Pulmonary:     Effort: Pulmonary effort is normal. No respiratory distress.     Breath sounds: Normal breath sounds. No wheezing or rales.  Abdominal:     General: There is no distension.     Palpations: Abdomen is soft.     Tenderness: There is no abdominal tenderness. There is no guarding or rebound.  Musculoskeletal:        General: No tenderness. Normal range of motion.     Cervical back: Normal range of motion and neck supple.  Skin:    General: Skin is warm and dry.     Findings: No  erythema or rash.  Neurological:     Mental Status: He is alert and oriented to person, place, and time.  Psychiatric:        Behavior: Behavior normal.     (all labs ordered are listed, but only abnormal results are displayed) Labs Reviewed  CBC WITH DIFFERENTIAL/PLATELET - Abnormal; Notable for the following components:      Result Value   RBC 3.74 (*)    Hemoglobin 10.8 (*)    HCT 33.8 (*)  RDW 17.2 (*)    All other components within normal limits  BASIC METABOLIC PANEL WITH GFR - Abnormal; Notable for the following components:   Potassium 3.4 (*)    Chloride 95 (*)    Glucose, Bld 214 (*)    Creatinine, Ser 5.22 (*)    GFR, Estimated 10 (*)    Anion gap 18 (*)    All other components within normal limits    EKG: None  Radiology: DG Chest Port 1 View Result Date: 03/22/2024 CLINICAL DATA:  Short of breath, congestion EXAM: PORTABLE CHEST 1 VIEW COMPARISON:  10/28/2023, 03/28/2023 FINDINGS: Single frontal view of the chest demonstrates a stable cardiac silhouette. No acute airspace disease, effusion, or pneumothorax. Stable oval density overlying the right anterior fourth rib, measuring up to 2 cm in size, corresponding to known pulmonary nodule previously evaluated 03/28/2023 by CT. No acute bony abnormalities. IMPRESSION: 1. No acute intrathoracic process. 2. Stable oval density right mid chest compatible with known pulmonary nodule. A 1 year follow-up nonemergent noncontrast chest CT was previously recommended on the 03/28/2023 exam. Electronically Signed   By: Ozell Daring M.D.   On: 03/22/2024 20:48   PERIPHERAL VASCULAR CATHETERIZATION Result Date: 03/21/2024 DATE OF SERVICE: 03/21/2024  PATIENT:  Austin [name]  89 y.o. male  PRE-OPERATIVE DIAGNOSIS:  end-stage renal disease; poorly functioning AVG  POST-OPERATIVE DIAGNOSIS:  Same  PROCEDURE:  1) Ultrasound guided left arm AVG access (CPT [phone]) 2) Left arm fistulagram (CPT [phone]) 3) established outpatient evaluation and  management - level 3 (CPT 99213)  SURGEON:  Debby SAILOR. Magda, MD  ASSISTANT: none  ANESTHESIA:   local  ESTIMATED BLOOD LOSS: min  LOCAL MEDICATIONS USED:  LIDOCAINE   COUNTS: confirmed correct.  PATIENT DISPOSITION:  PACU - hemodynamically stable.  Delay start of Pharmacological VTE agent (>24hrs) due to surgical blood loss or risk of bleeding: no  INDICATION FOR PROCEDURE: Orbin Mayeux is a 89 y.o. male with ESRD. His graft is not performing well at dialysis. After careful discussion of risks, benefits, and alternatives the patient was offered fistulagram. The patient understood and wished to proceed.  OPERATIVE FINDINGS: Left Upper Extremity Central venous: no stenosis Subclavian vein: no stenosis Axillary vein: no stenosis Venous anastomosis: no stenosis Graft: mild irregularity consistent with cannulation. No clear stenosis. Arterial anastomosis: no stenosis  DESCRIPTION OF PROCEDURE: After identification of the patient in the pre-operative holding area, the patient was transferred to the operating room. The patient was positioned supine on the operating room table.  The left extremity was prepped and draped in standard fashion. A surgical pause was performed confirming correct patient, procedure, and operative location.  The left extremity was anesthetized with subcutaneous injection of 1% lidocaine  over the area of planned access. Using ultrasound guidance, the left dialysis access was accessed with micropuncture technique.  Fistulogram was performed in stations with the micro sheath.  See above for details.  All endovascular equipment was removed.  A figure-of-eight stitch was applied to the exit site with good hemostasis.  Sterile bandage was applied.  Upon completion of the case instrument and sharps counts were confirmed correct. The patient was transferred to the PACU in good condition. I was present for all portions of the procedure.  PLAN: Ok to use graft. Graft remains amenable to percutaneous  intervention.  Debby SAILOR. Magda, MD [facility] Phone Number: [phone] 03/21/2024 12:01 PM     Procedures   Medications Ordered in the ED -  No data to display                                  Medical Decision Making Amount and/or Complexity of Data Reviewed External Data Reviewed: notes. Labs: ordered. Decision-making details documented in ED Course. Radiology: ordered and independent interpretation performed. Decision-making details documented in ED Course.  Risk Prescription drug management.   Pt with multiple medical problems and comorbidities and presenting today with a complaint that caries a high risk for morbidity and mortality.  Today with the above complaint.  Patient's symptoms seem to be more localized to ENT complaints.  He has no evidence of any swelling in his throat or concern for angioedema or allergic reaction.  There is no inflammation or voice changes or concern for a retropharyngeal abscess or epiglottitis.  Patient has no significant sinus tenderness and low suspicion for infectious etiology.  He has been on amoxicillin  for the last week due to a dental issue with no improvement.  He is also been on cetirizine  and a nasal spray without improvement either.  He has not tried any sinus rinses.  He does not appear significantly fluid overloaded today.  Patient does have a murmur which is unchanged from his past.  He is well-appearing on exam and satting 95 to 97% on room air.  Breath sounds are clear.  I independently interpreted patient's labs to rule out anemia as a cause of shortness of breath or electrolyte abnormalities.  CBC shows stable hemoglobin of 10.8 with a normal white count, BMP with findings consistent with end-stage renal disease but no evidence of hyperkalemia.  I have independently visualized and interpreted pt's images today.  Chest x-ray without acute findings.  Radiology does report there is a stable density  in the right mid chest but no new findings.  All this was discussed with the patient.  Will try a very short course of prednisone  to see if that helps with his congestion and drainage.  He already takes a PPI and denies any GERD type symptoms.  Also encouraged him to try the sinus rinses.       Final diagnoses:  Sinus congestion    ED Discharge Orders          Ordered    predniSONE  (DELTASONE ) 20 MG tablet  Daily        03/22/24 2206               Doretha Folks, MD 03/22/24 2218

## 2024-03-22 NOTE — Discharge Instructions (Addendum)
 I the sinus rinse at home and we will try a short course of steroids to see if that helps with the drainage you are experiencing.  While you are on the steroids you will have higher blood sugars than what you are used to because that is one of the side effects.  Make sure you are staying away from any sugary foods.

## 2024-03-23 ENCOUNTER — Other Ambulatory Visit: Payer: Self-pay | Admitting: Adult Health

## 2024-03-23 DIAGNOSIS — N179 Acute kidney failure, unspecified: Secondary | ICD-10-CM

## 2024-03-23 DIAGNOSIS — I2510 Atherosclerotic heart disease of native coronary artery without angina pectoris: Secondary | ICD-10-CM

## 2024-03-23 DIAGNOSIS — N186 End stage renal disease: Secondary | ICD-10-CM

## 2024-03-23 DIAGNOSIS — I5033 Acute on chronic diastolic (congestive) heart failure: Secondary | ICD-10-CM

## 2024-03-25 DIAGNOSIS — Z992 Dependence on renal dialysis: Secondary | ICD-10-CM | POA: Diagnosis not present

## 2024-03-25 DIAGNOSIS — N2581 Secondary hyperparathyroidism of renal origin: Secondary | ICD-10-CM | POA: Diagnosis not present

## 2024-03-25 DIAGNOSIS — N186 End stage renal disease: Secondary | ICD-10-CM | POA: Diagnosis not present

## 2024-03-27 DIAGNOSIS — Z992 Dependence on renal dialysis: Secondary | ICD-10-CM | POA: Diagnosis not present

## 2024-03-27 DIAGNOSIS — N186 End stage renal disease: Secondary | ICD-10-CM | POA: Diagnosis not present

## 2024-03-27 DIAGNOSIS — N2581 Secondary hyperparathyroidism of renal origin: Secondary | ICD-10-CM | POA: Diagnosis not present

## 2024-03-29 DIAGNOSIS — N186 End stage renal disease: Secondary | ICD-10-CM | POA: Diagnosis not present

## 2024-03-29 DIAGNOSIS — N2581 Secondary hyperparathyroidism of renal origin: Secondary | ICD-10-CM | POA: Diagnosis not present

## 2024-03-29 DIAGNOSIS — Z992 Dependence on renal dialysis: Secondary | ICD-10-CM | POA: Diagnosis not present

## 2024-04-01 DIAGNOSIS — N2581 Secondary hyperparathyroidism of renal origin: Secondary | ICD-10-CM | POA: Diagnosis not present

## 2024-04-01 DIAGNOSIS — Z992 Dependence on renal dialysis: Secondary | ICD-10-CM | POA: Diagnosis not present

## 2024-04-01 DIAGNOSIS — N186 End stage renal disease: Secondary | ICD-10-CM | POA: Diagnosis not present

## 2024-04-03 DIAGNOSIS — N2581 Secondary hyperparathyroidism of renal origin: Secondary | ICD-10-CM | POA: Diagnosis not present

## 2024-04-03 DIAGNOSIS — Z992 Dependence on renal dialysis: Secondary | ICD-10-CM | POA: Diagnosis not present

## 2024-04-03 DIAGNOSIS — N186 End stage renal disease: Secondary | ICD-10-CM | POA: Diagnosis not present

## 2024-04-04 ENCOUNTER — Ambulatory Visit: Admitting: Podiatry

## 2024-04-04 VITALS — Ht 67.0 in | Wt 180.8 lb

## 2024-04-04 DIAGNOSIS — I70209 Unspecified atherosclerosis of native arteries of extremities, unspecified extremity: Secondary | ICD-10-CM

## 2024-04-04 DIAGNOSIS — M79674 Pain in right toe(s): Secondary | ICD-10-CM

## 2024-04-04 DIAGNOSIS — L84 Corns and callosities: Secondary | ICD-10-CM

## 2024-04-04 DIAGNOSIS — E1151 Type 2 diabetes mellitus with diabetic peripheral angiopathy without gangrene: Secondary | ICD-10-CM | POA: Diagnosis not present

## 2024-04-04 DIAGNOSIS — B353 Tinea pedis: Secondary | ICD-10-CM

## 2024-04-04 DIAGNOSIS — M79675 Pain in left toe(s): Secondary | ICD-10-CM

## 2024-04-04 DIAGNOSIS — B351 Tinea unguium: Secondary | ICD-10-CM

## 2024-04-04 MED ORDER — KETOCONAZOLE 2 % EX CREA: 1.0000 | TOPICAL_CREAM | Freq: Two times a day (BID) | CUTANEOUS | 2 refills | Status: AC

## 2024-04-04 NOTE — Progress Notes (Signed)
  Subjective:  Patient ID: Austin [name], male    DOB: February 10, 1935,  MRN: [id number]  Chief Complaint  Patient presents with   Callouses    Rm 21 Patient is here for routine foot care and nail trimming. Patient has a callus on the left foot and right heel. Patient interested in treatment of dry skin on the feet.    89 y.o. male presents with the above complaint. History confirmed with patient.  He has diabetes that is well-controlled  Objective:  Physical Exam: warm, good capillary refill, no trophic changes or ulcerative lesions, normal DP and PT pulses, normal sensory exam, and tinea pedis. Left Foot: dystrophic yellowed discolored nail plates with subungual debris and callus submetatarsal 5 and 3 left foot Right Foot: dystrophic yellowed discolored nail plates with subungual debris   Assessment:   1. Tinea pedis of both feet   2. Pain due to onychomycosis of toenails of both feet   3. 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 Callus of foot      Plan:  Patient was evaluated and treated and all questions answered.  Discussed the etiology and treatment options for the condition in detail with the patient. Recommended debridement of the nails today. Sharp and mechanical debridement performed of all painful and mycotic nails today. Nails debrided in length and thickness using a nail nipper to level of comfort. Follow up as needed for painful nails.  All symptomatic hyperkeratoses were safely debrided with a sterile #15 blade to patient's level of comfort without incident. We discussed preventative and palliative care of these lesions including supportive and accommodative shoegear, padding, prefabricated and custom molded accommodative orthoses, use of a pumice stone and lotions/creams daily.  Discussed the etiology and treatment options for tinea pedis.  Discussed topical and oral treatment.  Recommended topical treatment with 2% ketoconazole cream.  This  was sent to the patient's pharmacy.  Also discussed appropriate foot hygiene, use of antifungal spray such as Tinactin in shoes, as well as cleaning her foot surfaces such as showers and bathroom floors with bleach.    Return in about 3 months (around 07/05/2024) for at risk diabetic foot care.

## 2024-04-05 DIAGNOSIS — Z992 Dependence on renal dialysis: Secondary | ICD-10-CM | POA: Diagnosis not present

## 2024-04-05 DIAGNOSIS — N186 End stage renal disease: Secondary | ICD-10-CM | POA: Diagnosis not present

## 2024-04-05 DIAGNOSIS — N2581 Secondary hyperparathyroidism of renal origin: Secondary | ICD-10-CM | POA: Diagnosis not present

## 2024-04-08 DIAGNOSIS — N186 End stage renal disease: Secondary | ICD-10-CM | POA: Diagnosis not present

## 2024-04-08 DIAGNOSIS — Z992 Dependence on renal dialysis: Secondary | ICD-10-CM | POA: Diagnosis not present

## 2024-04-08 DIAGNOSIS — N2581 Secondary hyperparathyroidism of renal origin: Secondary | ICD-10-CM | POA: Diagnosis not present

## 2024-04-10 DIAGNOSIS — Z992 Dependence on renal dialysis: Secondary | ICD-10-CM | POA: Diagnosis not present

## 2024-04-10 DIAGNOSIS — N2581 Secondary hyperparathyroidism of renal origin: Secondary | ICD-10-CM | POA: Diagnosis not present

## 2024-04-10 DIAGNOSIS — N186 End stage renal disease: Secondary | ICD-10-CM | POA: Diagnosis not present

## 2024-04-11 ENCOUNTER — Ambulatory Visit: Admitting: Podiatry

## 2024-04-12 DIAGNOSIS — N186 End stage renal disease: Secondary | ICD-10-CM | POA: Diagnosis not present

## 2024-04-12 DIAGNOSIS — N2581 Secondary hyperparathyroidism of renal origin: Secondary | ICD-10-CM | POA: Diagnosis not present

## 2024-04-12 DIAGNOSIS — Z992 Dependence on renal dialysis: Secondary | ICD-10-CM | POA: Diagnosis not present

## 2024-04-15 DIAGNOSIS — Z992 Dependence on renal dialysis: Secondary | ICD-10-CM | POA: Diagnosis not present

## 2024-04-15 DIAGNOSIS — N2581 Secondary hyperparathyroidism of renal origin: Secondary | ICD-10-CM | POA: Diagnosis not present

## 2024-04-15 DIAGNOSIS — N186 End stage renal disease: Secondary | ICD-10-CM | POA: Diagnosis not present

## 2024-04-15 NOTE — Progress Notes (Unsigned)
 [facility] [address] [place] [address] Phone: [phone] Subjective:   [name], am serving as a scribe for Dr. Arthea Claudene.  I'm seeing this patient by the request  of:  Merna Huxley, NP  CC: Right wrist pain follow-up  YEP:Dlagzrupcz  11/23/2023 She left the moment.  Discussed icing regimen and home exercises.  Follow-up 3 months to further evaluate     Update 04/16/2024 Cordaro Mukai is a 89 y.o. male coming in with complaint of R wrist pain. Also having lower back pain. Patient states that wrist is doing a lot better.   Lower back pain worse in mornings but improves as the day goes on. Takes tylenol  PRN. Pain throughout entire lower back. Denies any radiating symptoms.   Reviewing patient's chart has been seen significantly improved significant amount of time for his end-stage renal disease and dialysis.  Has had sinusitis as well.    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 BLOOD LOSS 03/13/2007   CAROTID ARTERY STENOSIS 05/10/2010   CHF (congestive heart failure) (HCC)    DIABETES MELLITUS, TYPE II 09/19/2007   DISEASE, CEREBROVASCULAR NEC 03/05/2007   GERD 03/13/2007   HYPERLIPIDEMIA 03/05/2007   HYPERTENSION 03/05/2007   HYPOKALEMIA 11/09/2009   KNEE PAIN, RIGHT 11/09/2009   PROSTATE CANCER, HX OF 03/05/2007   RENAL DISEASE, CHRONIC 02/03/2009   Past Surgical History:  Procedure Laterality Date   A/V FISTULAGRAM N/A 03/21/2024   Procedure: A/V Fistulagram;  Surgeon: Magda Debby SAILOR, MD;  Location: [facility];  Service: Cardiovascular;  Laterality: N/A;   AV FISTULA PLACEMENT Left 10/18/2022   Procedure: INSERTION OF LEFT ARM BRACHIAL ARTERY TO AXILLARY VEIN ARTERIOVENOUS (AV) GORE-TEX GRAFT;  Surgeon: Lanis Fonda BRAVO, MD;  Location: [facility] OR;  Service: Vascular;   Laterality: Left;   CAROTID ARTERY ANGIOPLASTY Right Oct. 10, 2001   ESOPHAGOGASTRODUODENOSCOPY (EGD) WITH PROPOFOL  N/A 11/04/2016   Procedure: ESOPHAGOGASTRODUODENOSCOPY (EGD) WITH PROPOFOL ;  Surgeon: Layla Lah, MD;  Location: [facility];  Service: Gastroenterology;  Laterality: N/A;   IR FLUORO GUIDE CV LINE RIGHT  10/25/2022   IR FLUORO GUIDE CV LINE RIGHT  10/27/2022   IR US  GUIDE VASC ACCESS RIGHT  10/25/2022   IR US  GUIDE VASC ACCESS RIGHT  10/27/2022   LAPAROSCOPIC APPENDECTOMY  02/20/2012   Procedure: APPENDECTOMY LAPAROSCOPIC;  Surgeon: Jina Nephew, MD;  Location: [facility] OR;  Service: General;  Laterality: N/A;   PROSTATE SURGERY     prostatectomy   Social History   Socioeconomic History   Marital status: Married    Spouse name: Not on file   Number of children: Not on file   Years of education: Not on file   Highest education level: Not on file  Occupational History   Not on file  Tobacco Use   Smoking status: Former    Current packs/day: 0.00    Average packs/day: 1 pack/day for 10.0 years (10.0 ttl pk-yrs)    Types: Cigarettes    Start date: 08/22/1964    Quit date: 08/22/1974    Years since quitting: 49.6   Smokeless tobacco: Never  Vaping Use   Vaping status: Never Used  Substance and Sexual Activity   Alcohol  use: No    Alcohol /week: 0.0 standard drinks of alcohol    Drug use: No   Sexual activity: Not  Currently  Other Topics Concern   Not on file  Social History Narrative   Retired - Administrator, arts    Married 53 years       He enjoys traveling    Social Drivers of [employer]: Low Risk  (02/09/2023)   Overall Financial Resource Strain (CARDIA)    Difficulty of Paying Living Expenses: Not very hard  Food Insecurity: No Food Insecurity (02/09/2023)   Hunger Vital Sign    Worried About Running Out of Food in the Last Year: Never true    Ran Out of Food in the Last Year: Never true  Transportation Needs: No Transportation Needs  (07/27/2022)   PRAPARE - Administrator, Civil Service (Medical): No    Lack of Transportation (Non-Medical): No  Physical Activity: Insufficiently Active (02/09/2023)   Exercise Vital Sign    Days of Exercise per Week: 3 days    Minutes of Exercise per Session: 30 min  Stress: No Stress Concern Present (02/09/2023)   [employer] of Occupational Health - Occupational Stress Questionnaire    Feeling of Stress : Not at all  Social Connections: Moderately Integrated (02/09/2023)   Social Connection and Isolation Panel    Frequency of Communication with Friends and Family: Twice a week    Frequency of Social Gatherings with Friends and Family: Never    Attends Religious Services: More than 4 times per year    Active Member of [employer] or Organizations: Yes    Attends Engineer, structural: More than 4 times per year    Marital Status: Married   Allergies  Allergen Reactions   Aspirin Other (See Comments)    High doses causes stomach ulcer and bleeding   Family History  Problem Relation Age of Onset   Hypertension Mother    Cancer Father        Mesothelioma    Stomach cancer Brother    Cancer Brother    Cancer - Cervical Brother    Cancer Brother    Diabetes Brother    Esophageal cancer Neg Hx    Colon cancer Neg Hx    Pancreatic cancer Neg Hx     Current Outpatient Medications (Endocrine & Metabolic):    predniSONE  (DELTASONE ) 20 MG tablet, Take 1 tablet (20 mg total) by mouth daily.  Current Outpatient Medications (Cardiovascular):    amLODipine  (NORVASC ) 10 MG tablet, TAKE 1 TABLET BY MOUTH EVERY DAY   atorvastatin  (LIPITOR) 20 MG tablet, TAKE 1 TABLET BY MOUTH EVERY DAY   cloNIDine  (CATAPRES ) 0.3 MG tablet, Take 1 tablet (0.3 mg total) by mouth 3 (three) times daily. (Patient taking differently: Take 0.3 mg by mouth 2 (two) times daily.)   doxazosin  (CARDURA ) 2 MG tablet, Take 2 mg by mouth at bedtime.    isosorbide -hydrALAZINE  (BIDIL ) 20-37.5 MG  tablet, Take 1 tablet by mouth 3 (three) times daily.   metolazone  (ZAROXOLYN ) 5 MG tablet, Take 5 mg by mouth daily as needed.  Current Outpatient Medications (Respiratory):    albuterol  (VENTOLIN  HFA) 108 (90 Base) MCG/ACT inhaler, TAKE 2 PUFFS BY MOUTH EVERY 6 HOURS AS NEEDED FOR WHEEZE OR SHORTNESS OF BREATH   cetirizine  (ZYRTEC ) 5 MG tablet, Take 1 tablet (5 mg total) by mouth at bedtime.   fluticasone  (FLONASE ) 50 MCG/ACT nasal spray, Place 1 spray into both nostrils daily.   guaiFENesin  (MUCINEX ) 600 MG 12 hr tablet, Take 600 mg by mouth at bedtime.  Current Outpatient Medications (Analgesics):  acetaminophen  (TYLENOL ) 500 MG tablet, Take 1,000 mg by mouth every 4 (four) hours as needed.  Current Outpatient Medications (Hematological):    clopidogrel  (PLAVIX ) 75 MG tablet, TAKE 1 TABLET BY MOUTH EVERY DAY   Methoxy PEG-Epoetin  Beta (MIRCERA IJ), Mircera  Current Outpatient Medications (Other):    Accu-Chek Softclix Lancets lancets, USED TO CHECK BLOOD GLUCOSE TWICE A DAY OR AS NEEDED   cholecalciferol  (VITAMIN D3) 25 MCG (1000 UNIT) tablet, Take 1,000 Units by mouth daily.   esomeprazole  (NEXIUM ) 40 MG capsule, TAKE 1 CAPSULE BY MOUTH EVERY DAY   glucose blood (ACCU-CHEK GUIDE) test strip, USE TO CHECK BLOOD GLUCOSE TWICE A DAY OR AS NEEDED   ketoconazole  (NIZORAL ) 2 % cream, Apply 1 Application topically 2 (two) times daily.   lactulose  (CHRONULAC ) 10 GM/15ML solution, TAKE 30 ML BY MOUTH TWICE DAILY AS NEEDED   Melatonin 10 MG TABS, Take 10 mg by mouth at bedtime.   methylcellulose (CITRUCEL) oral powder, 1 tablespoon every 2-3 days,alternating with lactulose    neomycin-polymyxin b-dexamethasone  (MAXITROL) 3.5-10000-0.1 SUSP, Place 1 drop into both eyes 4 (four) times daily.   Propylene Glycol (SYSTANE BALANCE OP), Place 1 drop into both eyes at bedtime.   sevelamer carbonate (RENVELA) 800 MG tablet, Take 800 mg by mouth 3 (three) times daily.   triamcinolone  cream (KENALOG )  0.1 %, Apply 1 application topically daily as needed (dry/irritated skin).   Reviewed prior external information including notes and imaging from  primary care provider As well as notes that were available from care everywhere and other healthcare systems.  Past medical history, social, surgical and family history all reviewed in electronic medical record.  No pertanent information unless stated regarding to the chief complaint.   Review of Systems:  No headache, visual changes, nausea, vomiting, diarrhea, constipation, dizziness, abdominal pain, skin rash, fevers, chills, night sweats, weight loss, swollen lymph nodes, body aches, joint swelling, chest pain, shortness of breath, mood changes. POSITIVE muscle aches  Objective  Blood pressure (!) 108/52, pulse 95, height 5' 7 (1.702 m), weight 174 lb (78.9 kg), SpO2 95%.   General: No apparent distress alert and oriented x3 mood and affect normal, dressed appropriately.  HEENT: Pupils equal, extraocular movements intact  Respiratory: Patient's speak in full sentences and does not appear short of breath  Cardiovascular: No lower extremity edema, non tender, no erythema  Right wrist exam shows improvement in swelling from previous exam.  Still known positive Tinel's noted.  Neurovascularly intact  Low back does have some loss of lordosis noted.  Is more limited extension of the back at all today.  Patient has difficulty with standing up and some mild shuffling gait noted.   [id number]; 15 additional minutes spent for Therapeutic exercises as stated in above notes.  This included exercises focusing on stretching, strengthening, with significant focus on eccentric aspects.   Long term goals include an improvement in range of motion, strength, endurance as well as avoiding reinjury. Patient's frequency would include in 1-2 times a day, 3-5 times a week for a duration of 6-12 weeks. Low back exercises that included:  Pelvic tilt/bracing instruction to  focus on control of the pelvic girdle and lower abdominal muscles  Glute strengthening exercises, focusing on proper firing of the glutes without engaging the low back muscles Proper stretching techniques for maximum relief for the hamstrings, hip flexors, low back and some rotation where tolerated  Proper technique shown and discussed handout in great detail with ATC.  All questions were discussed and  answered.     Impression and Recommendations:     The above documentation has been reviewed and is accurate and complete Saim Almanza M Obrian Bulson, DO

## 2024-04-16 ENCOUNTER — Encounter: Payer: Self-pay | Admitting: Family Medicine

## 2024-04-16 ENCOUNTER — Other Ambulatory Visit: Payer: Self-pay

## 2024-04-16 ENCOUNTER — Ambulatory Visit: Admitting: Podiatry

## 2024-04-16 ENCOUNTER — Ambulatory Visit (INDEPENDENT_AMBULATORY_CARE_PROVIDER_SITE_OTHER)

## 2024-04-16 ENCOUNTER — Ambulatory Visit: Admitting: Family Medicine

## 2024-04-16 VITALS — BP 108/52 | HR 95 | Ht 67.0 in | Wt 174.0 lb

## 2024-04-16 DIAGNOSIS — M8580 Other specified disorders of bone density and structure, unspecified site: Secondary | ICD-10-CM | POA: Diagnosis not present

## 2024-04-16 DIAGNOSIS — Z8546 Personal history of malignant neoplasm of prostate: Secondary | ICD-10-CM

## 2024-04-16 DIAGNOSIS — M545 Low back pain, unspecified: Secondary | ICD-10-CM

## 2024-04-16 DIAGNOSIS — G5601 Carpal tunnel syndrome, right upper limb: Secondary | ICD-10-CM | POA: Diagnosis not present

## 2024-04-16 DIAGNOSIS — N185 Chronic kidney disease, stage 5: Secondary | ICD-10-CM | POA: Diagnosis not present

## 2024-04-16 DIAGNOSIS — M47816 Spondylosis without myelopathy or radiculopathy, lumbar region: Secondary | ICD-10-CM | POA: Insufficient documentation

## 2024-04-16 DIAGNOSIS — M549 Dorsalgia, unspecified: Secondary | ICD-10-CM | POA: Diagnosis not present

## 2024-04-16 NOTE — Assessment & Plan Note (Signed)
 Stable at the moment, no other changes in management, worsening pain can consider to repeat injection.  Continue bracing at home follow-up again in 6 to 8 weeks otherwise.

## 2024-04-16 NOTE — Patient Instructions (Signed)
 Xray  See me in 2-3 months If worsening pain can get MRI

## 2024-04-16 NOTE — Assessment & Plan Note (Signed)
 Likely facet arthropathy with patient not complaining of any true radicular symptoms.  Discussed icing regimen and home exercises, discussed which activities to do and which ones to avoid.  Increase activity slowly.  Discussed would like x-rays with patient having a history of prostate cancer as well as end-stage renal disease and MGUS.  Depending on findings this may change medical management.  May need advanced imaging as well.  Follow-up again 2 to 3 months

## 2024-04-17 DIAGNOSIS — N2581 Secondary hyperparathyroidism of renal origin: Secondary | ICD-10-CM | POA: Diagnosis not present

## 2024-04-17 DIAGNOSIS — Z992 Dependence on renal dialysis: Secondary | ICD-10-CM | POA: Diagnosis not present

## 2024-04-17 DIAGNOSIS — N186 End stage renal disease: Secondary | ICD-10-CM | POA: Diagnosis not present

## 2024-04-19 DIAGNOSIS — N2581 Secondary hyperparathyroidism of renal origin: Secondary | ICD-10-CM | POA: Diagnosis not present

## 2024-04-19 DIAGNOSIS — Z992 Dependence on renal dialysis: Secondary | ICD-10-CM | POA: Diagnosis not present

## 2024-04-19 DIAGNOSIS — N186 End stage renal disease: Secondary | ICD-10-CM | POA: Diagnosis not present

## 2024-04-21 ENCOUNTER — Other Ambulatory Visit: Payer: Self-pay | Admitting: Adult Health

## 2024-04-21 DIAGNOSIS — Z992 Dependence on renal dialysis: Secondary | ICD-10-CM | POA: Diagnosis not present

## 2024-04-21 DIAGNOSIS — N186 End stage renal disease: Secondary | ICD-10-CM | POA: Diagnosis not present

## 2024-04-21 DIAGNOSIS — E1022 Type 1 diabetes mellitus with diabetic chronic kidney disease: Secondary | ICD-10-CM | POA: Diagnosis not present

## 2024-04-22 DIAGNOSIS — N186 End stage renal disease: Secondary | ICD-10-CM | POA: Diagnosis not present

## 2024-04-22 DIAGNOSIS — N2581 Secondary hyperparathyroidism of renal origin: Secondary | ICD-10-CM | POA: Diagnosis not present

## 2024-04-22 DIAGNOSIS — Z992 Dependence on renal dialysis: Secondary | ICD-10-CM | POA: Diagnosis not present

## 2024-04-24 DIAGNOSIS — Z992 Dependence on renal dialysis: Secondary | ICD-10-CM | POA: Diagnosis not present

## 2024-04-24 DIAGNOSIS — N2581 Secondary hyperparathyroidism of renal origin: Secondary | ICD-10-CM | POA: Diagnosis not present

## 2024-04-24 DIAGNOSIS — N186 End stage renal disease: Secondary | ICD-10-CM | POA: Diagnosis not present

## 2024-04-25 ENCOUNTER — Other Ambulatory Visit: Payer: Self-pay

## 2024-04-25 ENCOUNTER — Emergency Department (HOSPITAL_COMMUNITY)

## 2024-04-25 ENCOUNTER — Encounter (HOSPITAL_COMMUNITY): Payer: Self-pay

## 2024-04-25 ENCOUNTER — Inpatient Hospital Stay (HOSPITAL_COMMUNITY)
Admission: EM | Admit: 2024-04-25 | Discharge: 2024-04-28 | DRG: 064 | Disposition: A | Discharge status: Home Health | Attending: Internal Medicine | Admitting: Internal Medicine

## 2024-04-25 DIAGNOSIS — I5031 Acute diastolic (congestive) heart failure: Secondary | ICD-10-CM | POA: Insufficient documentation

## 2024-04-25 DIAGNOSIS — H547 Unspecified visual loss: Secondary | ICD-10-CM | POA: Diagnosis present

## 2024-04-25 DIAGNOSIS — I132 Hypertensive heart and chronic kidney disease with heart failure and with stage 5 chronic kidney disease, or end stage renal disease: Secondary | ICD-10-CM | POA: Insufficient documentation

## 2024-04-25 DIAGNOSIS — E1165 Type 2 diabetes mellitus with hyperglycemia: Secondary | ICD-10-CM | POA: Diagnosis present

## 2024-04-25 DIAGNOSIS — I4891 Unspecified atrial fibrillation: Secondary | ICD-10-CM | POA: Diagnosis not present

## 2024-04-25 DIAGNOSIS — I2510 Atherosclerotic heart disease of native coronary artery without angina pectoris: Secondary | ICD-10-CM | POA: Diagnosis not present

## 2024-04-25 DIAGNOSIS — Z87891 Personal history of nicotine dependence: Secondary | ICD-10-CM

## 2024-04-25 DIAGNOSIS — D631 Anemia in chronic kidney disease: Secondary | ICD-10-CM | POA: Diagnosis not present

## 2024-04-25 DIAGNOSIS — Z8249 Family history of ischemic heart disease and other diseases of the circulatory system: Secondary | ICD-10-CM

## 2024-04-25 DIAGNOSIS — E1169 Type 2 diabetes mellitus with other specified complication: Secondary | ICD-10-CM | POA: Diagnosis present

## 2024-04-25 DIAGNOSIS — Z79899 Other long term (current) drug therapy: Secondary | ICD-10-CM | POA: Diagnosis not present

## 2024-04-25 DIAGNOSIS — Z23 Encounter for immunization: Secondary | ICD-10-CM | POA: Diagnosis not present

## 2024-04-25 DIAGNOSIS — H53462 Homonymous bilateral field defects, left side: Secondary | ICD-10-CM | POA: Diagnosis not present

## 2024-04-25 DIAGNOSIS — Z7982 Long term (current) use of aspirin: Secondary | ICD-10-CM | POA: Diagnosis not present

## 2024-04-25 DIAGNOSIS — Z833 Family history of diabetes mellitus: Secondary | ICD-10-CM | POA: Diagnosis not present

## 2024-04-25 DIAGNOSIS — K219 Gastro-esophageal reflux disease without esophagitis: Secondary | ICD-10-CM | POA: Diagnosis not present

## 2024-04-25 DIAGNOSIS — H353132 Nonexudative age-related macular degeneration, bilateral, intermediate dry stage: Secondary | ICD-10-CM | POA: Diagnosis not present

## 2024-04-25 DIAGNOSIS — I63531 Cerebral infarction due to unspecified occlusion or stenosis of right posterior cerebral artery: Secondary | ICD-10-CM | POA: Diagnosis not present

## 2024-04-25 DIAGNOSIS — G459 Transient cerebral ischemic attack, unspecified: Secondary | ICD-10-CM | POA: Diagnosis not present

## 2024-04-25 DIAGNOSIS — Z886 Allergy status to analgesic agent status: Secondary | ICD-10-CM

## 2024-04-25 DIAGNOSIS — I6389 Other cerebral infarction: Principal | ICD-10-CM | POA: Insufficient documentation

## 2024-04-25 DIAGNOSIS — I6782 Cerebral ischemia: Secondary | ICD-10-CM | POA: Diagnosis not present

## 2024-04-25 DIAGNOSIS — H35033 Hypertensive retinopathy, bilateral: Secondary | ICD-10-CM | POA: Diagnosis not present

## 2024-04-25 DIAGNOSIS — Z8616 Personal history of COVID-19: Secondary | ICD-10-CM

## 2024-04-25 DIAGNOSIS — Z7901 Long term (current) use of anticoagulants: Secondary | ICD-10-CM | POA: Diagnosis not present

## 2024-04-25 DIAGNOSIS — H43823 Vitreomacular adhesion, bilateral: Secondary | ICD-10-CM | POA: Diagnosis not present

## 2024-04-25 DIAGNOSIS — I272 Pulmonary hypertension, unspecified: Secondary | ICD-10-CM | POA: Diagnosis present

## 2024-04-25 DIAGNOSIS — Z7952 Long term (current) use of systemic steroids: Secondary | ICD-10-CM

## 2024-04-25 DIAGNOSIS — I120 Hypertensive chronic kidney disease with stage 5 chronic kidney disease or end stage renal disease: Secondary | ICD-10-CM | POA: Diagnosis not present

## 2024-04-25 DIAGNOSIS — I610 Nontraumatic intracerebral hemorrhage in hemisphere, subcortical: Secondary | ICD-10-CM | POA: Diagnosis not present

## 2024-04-25 DIAGNOSIS — I5033 Acute on chronic diastolic (congestive) heart failure: Secondary | ICD-10-CM

## 2024-04-25 DIAGNOSIS — Z992 Dependence on renal dialysis: Secondary | ICD-10-CM

## 2024-04-25 DIAGNOSIS — E785 Hyperlipidemia, unspecified: Secondary | ICD-10-CM | POA: Diagnosis present

## 2024-04-25 DIAGNOSIS — N186 End stage renal disease: Secondary | ICD-10-CM | POA: Insufficient documentation

## 2024-04-25 DIAGNOSIS — Z800 Family history of malignant neoplasm of digestive organs: Secondary | ICD-10-CM | POA: Diagnosis not present

## 2024-04-25 DIAGNOSIS — I639 Cerebral infarction, unspecified: Principal | ICD-10-CM | POA: Diagnosis present

## 2024-04-25 DIAGNOSIS — R9089 Other abnormal findings on diagnostic imaging of central nervous system: Secondary | ICD-10-CM | POA: Diagnosis not present

## 2024-04-25 DIAGNOSIS — I10 Essential (primary) hypertension: Secondary | ICD-10-CM | POA: Diagnosis not present

## 2024-04-25 DIAGNOSIS — Z961 Presence of intraocular lens: Secondary | ICD-10-CM | POA: Diagnosis not present

## 2024-04-25 DIAGNOSIS — I6381 Other cerebral infarction due to occlusion or stenosis of small artery: Secondary | ICD-10-CM | POA: Diagnosis not present

## 2024-04-25 DIAGNOSIS — K921 Melena: Secondary | ICD-10-CM | POA: Diagnosis present

## 2024-04-25 DIAGNOSIS — E113293 Type 2 diabetes mellitus with mild nonproliferative diabetic retinopathy without macular edema, bilateral: Secondary | ICD-10-CM | POA: Diagnosis not present

## 2024-04-25 DIAGNOSIS — R29702 NIHSS score 2: Secondary | ICD-10-CM | POA: Diagnosis not present

## 2024-04-25 DIAGNOSIS — E1122 Type 2 diabetes mellitus with diabetic chronic kidney disease: Secondary | ICD-10-CM | POA: Diagnosis not present

## 2024-04-25 DIAGNOSIS — Z8546 Personal history of malignant neoplasm of prostate: Secondary | ICD-10-CM | POA: Diagnosis not present

## 2024-04-25 DIAGNOSIS — H31092 Other chorioretinal scars, left eye: Secondary | ICD-10-CM | POA: Diagnosis not present

## 2024-04-25 DIAGNOSIS — H53461 Homonymous bilateral field defects, right side: Secondary | ICD-10-CM | POA: Diagnosis not present

## 2024-04-25 DIAGNOSIS — H35713 Central serous chorioretinopathy, bilateral: Secondary | ICD-10-CM | POA: Diagnosis not present

## 2024-04-25 DIAGNOSIS — Z8673 Personal history of transient ischemic attack (TIA), and cerebral infarction without residual deficits: Secondary | ICD-10-CM | POA: Diagnosis not present

## 2024-04-25 HISTORY — DX: End stage renal disease: N18.6

## 2024-04-25 HISTORY — DX: Cerebral infarction due to unspecified occlusion or stenosis of right posterior cerebral artery: I63.531

## 2024-04-25 HISTORY — DX: Atherosclerotic heart disease of native coronary artery without angina pectoris: I25.10

## 2024-04-25 LAB — COMPREHENSIVE METABOLIC PANEL WITH GFR
ALT: 8 U/L (ref 0–44)
AST: 13 U/L — ABNORMAL LOW (ref 15–41)
Albumin: 3.8 g/dL (ref 3.5–5.0)
Alkaline Phosphatase: 93 U/L (ref 38–126)
Anion gap: 20 — ABNORMAL HIGH (ref 5–15)
BUN: 44 mg/dL — ABNORMAL HIGH (ref 8–23)
CO2: 24 mmol/L (ref 22–32)
Calcium: 9.8 mg/dL (ref 8.9–10.3)
Chloride: 95 mmol/L — ABNORMAL LOW (ref 98–111)
Creatinine, Ser: 8.24 mg/dL — ABNORMAL HIGH (ref 0.61–1.24)
GFR, Estimated: 6 mL/min — ABNORMAL LOW (ref >60)
Glucose, Bld: 153 mg/dL — ABNORMAL HIGH (ref 70–99)
Potassium: 3.7 mmol/L (ref 3.5–5.1)
Sodium: 139 mmol/L (ref 135–145)
Total Bilirubin: 1.1 mg/dL (ref 0.0–1.2)
Total Protein: 8.5 g/dL — ABNORMAL HIGH (ref 6.5–8.1)

## 2024-04-25 LAB — CBC
HCT: 37.8 % — ABNORMAL LOW (ref 39.0–52.0)
Hemoglobin: 12.1 g/dL — ABNORMAL LOW (ref 13.0–17.0)
MCH: 29.1 pg (ref 26.0–34.0)
MCHC: 32 g/dL (ref 30.0–36.0)
MCV: 90.9 fL (ref 80.0–100.0)
Platelets: 433 K/uL — ABNORMAL HIGH (ref 150–400)
RBC: 4.16 MIL/uL — ABNORMAL LOW (ref 4.22–5.81)
RDW: 16.6 % — ABNORMAL HIGH (ref 11.5–15.5)
WBC: 10.1 K/uL (ref 4.0–10.5)
nRBC: 0 % (ref 0.0–0.2)

## 2024-04-25 LAB — DIFFERENTIAL
Abs Immature Granulocytes: 0.06 K/uL (ref 0.00–0.07)
Basophils Absolute: 0.1 K/uL (ref 0.0–0.1)
Basophils Relative: 1 %
Eosinophils Absolute: 0.2 K/uL (ref 0.0–0.5)
Eosinophils Relative: 2 %
Immature Granulocytes: 1 %
Lymphocytes Relative: 14 %
Lymphs Abs: 1.4 K/uL (ref 0.7–4.0)
Monocytes Absolute: 0.8 K/uL (ref 0.1–1.0)
Monocytes Relative: 8 %
Neutro Abs: 7.7 K/uL (ref 1.7–7.7)
Neutrophils Relative %: 74 %

## 2024-04-25 LAB — I-STAT CHEM 8, ED
BUN: 47 mg/dL — ABNORMAL HIGH (ref 8–23)
Calcium, Ion: 1.09 mmol/L — ABNORMAL LOW (ref 1.15–1.40)
Chloride: 99 mmol/L (ref 98–111)
Creatinine, Ser: 8.2 mg/dL — ABNORMAL HIGH (ref 0.61–1.24)
Glucose, Bld: 153 mg/dL — ABNORMAL HIGH (ref 70–99)
HCT: 41 % (ref 39.0–52.0)
Hemoglobin: 13.9 g/dL (ref 13.0–17.0)
Potassium: 3.7 mmol/L (ref 3.5–5.1)
Sodium: 138 mmol/L (ref 135–145)
TCO2: 27 mmol/L (ref 22–32)

## 2024-04-25 LAB — PROTIME-INR
INR: 1 (ref 0.8–1.2)
Prothrombin Time: 14.3 s (ref 11.4–15.2)

## 2024-04-25 LAB — APTT: aPTT: 37 s — ABNORMAL HIGH (ref 24–36)

## 2024-04-25 LAB — ETHANOL: Alcohol, Ethyl (B): <15 mg/dL (ref <15)

## 2024-04-25 LAB — HM DIABETES EYE EXAM

## 2024-04-25 MED ORDER — ASPIRIN 81 MG PO CHEW
324.0000 mg | CHEWABLE_TABLET | Freq: Once | ORAL | Status: AC
  Administered 2024-04-25: 324 mg via ORAL
  Filled 2024-04-25: qty 4

## 2024-04-25 NOTE — ED Notes (Signed)
 Pt states he produces little to no urine.

## 2024-04-25 NOTE — ED Provider Notes (Signed)
 [facility] Provider Note  CSN: [id number] Arrival date & time: 04/25/24 1517  Chief Complaint(s) Loss of Vision  HPI Austin [name] is a 89 y.o. male with past medical history as below, significant for diastolic heart failure, DM, HLD, HTN, carotid artery stenosis, ESRD on HD Monday Wednesday Friday who presents to the ED with complaint of vision change  Patient reports over the past 3 to 4 days he has noticed some darkening to his vision primarily on the left side or when he looks to the left.  He says symptoms occurred all of a sudden and have not improved or worsened since the onset. Denies diplopia, no pain with eye movement, no headache or fevers.  No numbness or weakness to extremities, no difficulty speaking or swallowing or difficulty with ambulation, denies similar symptoms in the past.  ESRD and HD Monday Wednesday Friday, last dialysis was yesterday, denies missed sessions in the past 2 weeks  Past Medical History 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 BLOOD LOSS 03/13/2007   CAROTID ARTERY STENOSIS 05/10/2010   CHF (congestive heart failure) (HCC)    DIABETES MELLITUS, TYPE II 09/19/2007   DISEASE, CEREBROVASCULAR NEC 03/05/2007   GERD 03/13/2007   HYPERLIPIDEMIA 03/05/2007   HYPERTENSION 03/05/2007   HYPOKALEMIA 11/09/2009   KNEE PAIN, RIGHT 11/09/2009   PROSTATE CANCER, HX OF 03/05/2007   RENAL DISEASE, CHRONIC 02/03/2009   Patient Active Problem List   Diagnosis Date Noted   Lumbar facet arthropathy 04/16/2024   Preoperative evaluation of a medical condition to rule out surgical contraindications (TAR required) 05/04/2023   Right carpal tunnel syndrome 03/16/2023   ESRD on dialysis (HCC) 10/29/2022   Protein-calorie malnutrition, severe 10/28/2022   Pneumonia due  to COVID-19 virus 10/25/2022   CKD (chronic kidney disease) stage 5, GFR less than 15 ml/min (HCC) 10/25/2022   Severe pulmonary hypertension (HCC) 10/25/2022   Moderate aortic regurgitation 10/25/2022   CAD in native artery 10/25/2022   UTI (urinary tract infection) 09/27/2021   CHF exacerbation (HCC) 09/26/2021   Acute respiratory failure with hypoxia (HCC) 09/26/2021   Acute on chronic diastolic CHF (congestive heart failure) (HCC) 07/09/2021   Dyspnea 06/01/2021   Upper airway cough syndrome 04/06/2021   Porokeratosis 03/26/2021   Pulmonary nodules 10/22/2020   Acute on chronic renal failure (HCC) 10/09/2020   Mass of right lung 09/18/2020   Acute kidney injury superimposed on CKD (HCC) 08/04/2019   Elevated troponin 08/04/2019   Hypokalemia 08/04/2019   Hypertensive urgency 03/28/2019   Elevated serum immunoglobulin free light chains 02/20/2019   Gastrointestinal hemorrhage with melena    Melena 11/03/2016   Carotid artery stenosis 05/10/2010   Type 2 diabetes mellitus with hyperlipidemia (HCC) 09/19/2007   Iron  deficiency anemia due to chronic blood loss 03/13/2007   GERD 03/13/2007   Essential hypertension 03/05/2007   DISEASE, CEREBROVASCULAR NEC 03/05/2007   PROSTATE CANCER, HX OF 03/05/2007   Home Medication(s) Prior to Admission medications   Medication Sig Start Date End Date Taking? Authorizing Provider  Accu-Chek Softclix Lancets lancets USED TO CHECK BLOOD GLUCOSE TWICE A DAY OR AS NEEDED 06/08/23   Nafziger, Darleene, NP  acetaminophen  (TYLENOL ) 500 MG tablet Take 1,000 mg by mouth every 4 (four) hours as needed.    [provider]  albuterol  (VENTOLIN  HFA) 108 (90 Base)  MCG/ACT inhaler TAKE 2 PUFFS BY MOUTH EVERY 6 HOURS AS NEEDED FOR WHEEZE OR SHORTNESS OF BREATH 09/13/22   Shelah Lamar RAMAN, MD  amLODipine  (NORVASC ) 10 MG tablet TAKE 1 TABLET BY MOUTH EVERY DAY 12/05/23   Nafziger, Darleene, NP  atorvastatin  (LIPITOR) 20 MG tablet TAKE 1 TABLET BY MOUTH EVERY DAY  03/26/24   Nafziger, Darleene, NP  cetirizine  (ZYRTEC ) 5 MG tablet Take 1 tablet (5 mg total) by mouth at bedtime. 03/15/24   Rising, Asberry, PA-C  cholecalciferol  (VITAMIN D3) 25 MCG (1000 UNIT) tablet Take 1,000 Units by mouth daily.    [provider]  cloNIDine  (CATAPRES ) 0.3 MG tablet Take 1 tablet (0.3 mg total) by mouth 3 (three) times daily. Patient taking differently: Take 0.3 mg by mouth 2 (two) times daily. 09/20/20   Segal, Jared E, MD  clopidogrel  (PLAVIX ) 75 MG tablet TAKE 1 TABLET BY MOUTH EVERY DAY 07/18/23   Nafziger, Darleene, NP  doxazosin  (CARDURA ) 2 MG tablet Take 2 mg by mouth at bedtime.  12/28/17   [provider]  esomeprazole  (NEXIUM ) 40 MG capsule TAKE 1 CAPSULE BY MOUTH EVERY DAY 03/07/24   Nafziger, Darleene, NP  fluticasone  (FLONASE ) 50 MCG/ACT nasal spray Place 1 spray into both nostrils daily. 03/15/24   Rising, Rebecca, PA-C  glucose blood (ACCU-CHEK GUIDE) test strip USE TO CHECK BLOOD GLUCOSE TWICE A DAY OR AS NEEDED 04/27/23   Nafziger, Darleene, NP  guaiFENesin  (MUCINEX ) 600 MG 12 hr tablet Take 600 mg by mouth at bedtime.    [provider]  isosorbide -hydrALAZINE  (BIDIL ) 20-37.5 MG tablet Take 1 tablet by mouth 3 (three) times daily.    [provider]  ketoconazole  (NIZORAL ) 2 % cream Apply 1 Application topically 2 (two) times daily. 04/04/24   McDonald, Juliene SAUNDERS, DPM  lactulose  (CHRONULAC ) 10 GM/15ML solution TAKE 30 ML BY MOUTH TWICE DAILY AS NEEDED 04/24/24   Nafziger, Darleene, NP  Melatonin 10 MG TABS Take 10 mg by mouth at bedtime.    [provider]  Methoxy PEG-Epoetin  Beta (MIRCERA IJ) Mircera 10/30/23 10/28/24  [provider]  methylcellulose (CITRUCEL) oral powder 1 tablespoon every 2-3 days,alternating with lactulose     [provider]  metolazone  (ZAROXOLYN ) 5 MG tablet Take 5 mg by mouth daily as needed. 12/01/22   [provider]  neomycin-polymyxin b-dexamethasone  (MAXITROL) 3.5-10000-0.1 SUSP Place 1 drop  into both eyes 4 (four) times daily. 10/13/22   [provider]  predniSONE  (DELTASONE ) 20 MG tablet Take 1 tablet (20 mg total) by mouth daily. 03/22/24   Doretha Folks, MD  Propylene Glycol (SYSTANE BALANCE OP) Place 1 drop into both eyes at bedtime.    [provider]  sevelamer  carbonate (RENVELA ) 800 MG tablet Take 800 mg by mouth 3 (three) times daily. 12/08/22   [provider]  triamcinolone  cream (KENALOG ) 0.1 % Apply 1 application topically daily as needed (dry/irritated skin). 07/05/19   [provider]  Past Surgical History Past Surgical History:  Procedure Laterality Date   A/V FISTULAGRAM N/A 03/21/2024   Procedure: A/V Fistulagram;  Surgeon: Magda Debby SAILOR, MD;  Location: [facility];  Service: Cardiovascular;  Laterality: N/A;   AV FISTULA PLACEMENT Left 10/18/2022   Procedure: INSERTION OF LEFT ARM BRACHIAL ARTERY TO AXILLARY VEIN ARTERIOVENOUS (AV) GORE-TEX GRAFT;  Surgeon: Lanis Fonda BRAVO, MD;  Location: [facility] OR;  Service: Vascular;  Laterality: Left;   CAROTID ARTERY ANGIOPLASTY Right Oct. 10, 2001   ESOPHAGOGASTRODUODENOSCOPY (EGD) WITH PROPOFOL  N/A 11/04/2016   Procedure: ESOPHAGOGASTRODUODENOSCOPY (EGD) WITH PROPOFOL ;  Surgeon: Layla Lah, MD;  Location: [facility];  Service: Gastroenterology;  Laterality: N/A;   IR FLUORO GUIDE CV LINE RIGHT  10/25/2022   IR FLUORO GUIDE CV LINE RIGHT  10/27/2022   IR US  GUIDE VASC ACCESS RIGHT  10/25/2022   IR US  GUIDE VASC ACCESS RIGHT  10/27/2022   LAPAROSCOPIC APPENDECTOMY  02/20/2012   Procedure: APPENDECTOMY LAPAROSCOPIC;  Surgeon: Jina Nephew, MD;  Location: [facility] OR;  Service: General;  Laterality: N/A;   PROSTATE SURGERY     prostatectomy   Family History Family History  Problem Relation Age of Onset   Hypertension Mother    Cancer Father        Mesothelioma     Stomach cancer Brother    Cancer Brother    Cancer - Cervical Brother    Cancer Brother    Diabetes Brother    Esophageal cancer Neg Hx    Colon cancer Neg Hx    Pancreatic cancer Neg Hx     Social History Social History   Tobacco Use   Smoking status: Former    Current packs/day: 0.00    Average packs/day: 1 pack/day for 10.0 years (10.0 ttl pk-yrs)    Types: Cigarettes    Start date: 08/22/1964    Quit date: 08/22/1974    Years since quitting: 49.7   Smokeless tobacco: Never  Vaping Use   Vaping status: Never Used  Substance Use Topics   Alcohol  use: No    Alcohol /week: 0.0 standard drinks of alcohol    Drug use: No   Allergies Aspirin   Review of Systems A thorough review of systems was obtained and all systems are negative except as noted in the HPI and PMH.   Physical Exam Vital Signs  I have reviewed the triage vital signs BP (!) 171/59   Pulse 93   Temp 98.8 F (37.1 C)   Resp 14   SpO2 99%  Physical Exam Vitals and nursing note reviewed.  Constitutional:      General: He is not in acute distress.    Appearance: He is well-developed.  HENT:     Head: Normocephalic and atraumatic. No right periorbital erythema or left periorbital erythema.      Right Ear: External ear normal.     Left Ear: External ear normal.     Mouth/Throat:     Mouth: Mucous membranes are moist.  Eyes:     General: No scleral icterus.       Right eye: No discharge.        Left eye: No discharge.     Extraocular Movements: Extraocular movements intact.     Pupils: Pupils are equal, round, and reactive to light.  Cardiovascular:     Rate and Rhythm: Normal rate and regular rhythm.     Pulses: Normal pulses.     Heart sounds: Normal heart sounds.  Pulmonary:  Effort: Pulmonary effort is normal. No respiratory distress.     Breath sounds: Normal breath sounds.  Abdominal:     General: Abdomen is flat.     Palpations: Abdomen is soft.     Tenderness: There is no abdominal  tenderness.  Musculoskeletal:     Cervical back: No rigidity.     Right lower leg: No edema.     Left lower leg: No edema.  Skin:    General: Skin is warm and dry.     Capillary Refill: Capillary refill takes less than 2 seconds.      Neurological:     Mental Status: He is alert and oriented to person, place, and time.     GCS: GCS eye subscore is 4. GCS verbal subscore is 5. GCS motor subscore is 6.     Cranial Nerves: Cranial nerves 2-12 are intact. No dysarthria or facial asymmetry.     Sensory: Sensation is intact.     Motor: Motor function is intact. No tremor or pronator drift.     Coordination: Coordination is intact. Coordination normal. Finger-Nose-Finger Test and Heel to [facility] Test normal.     Comments: homonymous hemianopsia left  Strength 5/5 to BLUE/BLLE, equal and symmetric   Gait testing deferred secondary to patient safety.    Psychiatric:        Mood and Affect: Mood normal.        Behavior: Behavior normal.     ED Results and Treatments Labs (all labs ordered are listed, but only abnormal results are displayed) Labs Reviewed  APTT - Abnormal; Notable for the following components:      Result Value   aPTT 37 (*)    All other components within normal limits  CBC - Abnormal; Notable for the following components:   RBC 4.16 (*)    Hemoglobin 12.1 (*)    HCT 37.8 (*)    RDW 16.6 (*)    Platelets 433 (*)    All other components within normal limits  I-STAT CHEM 8, ED - Abnormal; Notable for the following components:   BUN 47 (*)    Creatinine, Ser 8.20 (*)    Glucose, Bld 153 (*)    Calcium , Ion 1.09 (*)    All other components within normal limits  PROTIME-INR  DIFFERENTIAL  ETHANOL  COMPREHENSIVE METABOLIC PANEL WITH GFR  RAPID URINE DRUG SCREEN, HOSP PERFORMED                                                                                                                          Radiology MR BRAIN WO CONTRAST Result Date: 04/25/2024 EXAM: MRI  BRAIN WITHOUT CONTRAST 04/25/2024 07:02:59 PM TECHNIQUE: Multiplanar multisequence MRI of the head/brain was performed without the administration of intravenous contrast. COMPARISON: Same day CT head and MRI head dated 12/12/2006. CLINICAL HISTORY: Transient ischemic attack (TIA). FINDINGS: BRAIN AND VENTRICLES: Restricted diffusion throughout the cortex and subcortical white matter within the right occipital lobe involving  the right PCA territory. Restricted diffusion also extends into the posterior right temporal lobe and right hippocampus. There are nonspecific hyperintense foci in the subcortical and periventricular white matter that most likely represent chronic microangiopathic ischemic changes in a patient of this age. Mild-to-moderate generalized parenchymal volume loss. There is a remote infarct in the right basal ganglia with evidence of prior hemorrhage. Additional remote lacunar infarcts in the left thalamus. Focus of similar signal abnormality and susceptibility within the left parietal lobe which may reflect focus of remote hemorrhage versus vascular malformations such as cavernous malformation. There is an additional similar focus of signal abnormality and susceptibility along the right dorsal aspect of the pons at the level of the superior cerebellar peduncle. ORBITS: Bilateral lens replacement. SINUSES AND MASTOIDS: Mucosal thickening in the paranasal sinuses involving the right frontal sinus, right anterior ethmoidary cells, and right maxillary sinus. Paranasal sinus disease in a right ostiomeatal pattern. 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 IMPRESSION: 1. Acute infarct in the right PCA territory as above. 2. 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 3. Additional remote lacunar infarcts in the left thalamus. 4. Focus of signal abnormality and susceptibility within the left parietal lobe,  possibly representing remote hemorrhage or vascular malformation such as cavernous malformation. Additional similar focus of signal abnormality and susceptibility along the right dorsal aspect of the pons. Electronically signed by: Donnice Mania MD 04/25/2024 07:33 PM EDT RP Workstation: [id number]   CT HEAD WO CONTRAST Result Date: 04/25/2024 CLINICAL DATA:  Transient ischemic attack (TIA). Bilateral visual loss for 4 days. EXAM: CT HEAD WITHOUT CONTRAST TECHNIQUE: Contiguous axial images were obtained from the base of the skull through the vertex without intravenous contrast. RADIATION DOSE REDUCTION: This exam was performed according to the departmental dose-optimization program which includes automated exposure control, adjustment of the mA and/or kV according to patient size and/or use of iterative reconstruction technique. COMPARISON:  Head CT 01/25/2024 and MRI 12/12/2006 FINDINGS: Brain: There is no evidence of an acute infarct, intracranial hemorrhage, mass, midline shift, or extra-axial fluid collection. There is mild cerebral atrophy. Patchy cerebral white matter hypodensities are similar to the prior CT and nonspecific but compatible with moderate chronic small vessel ischemic disease. A chronic lacunar infarct in the right basal ganglia is unchanged. A normal variant cavum septum pellucidum et vergae is noted. Vascular: Calcified atherosclerosis at the skull base. No hyperdense vessel. Skull: No fracture or suspicious lesion. Sinuses/Orbits: Chronic right maxillary sinusitis with persistent complete opacification and mild expansion of the included portion of the sinus. Clear mastoid air cells. Bilateral cataract extraction. Other: 4 cm left postauricular lipoma. IMPRESSION: 1. No evidence of acute intracranial abnormality. 2. Moderate chronic small vessel ischemic disease. Electronically Signed   By: Dasie Hamburg M.D.   On: 04/25/2024 16:32    Pertinent labs & imaging results that were available  during my care of the patient were reviewed by me and considered in my medical decision making (see MDM for details).  Medications Ordered in ED Medications - No data to display  Procedures .Critical Care  Performed by: Elnor Jayson LABOR, DO Authorized by: Elnor Jayson LABOR, DO   Critical care provider statement:    Critical care time (minutes):  30   Critical care time was exclusive of:  Separately billable procedures and treating other patients   Critical care was necessary to treat or prevent imminent or life-threatening deterioration of the following conditions:  CNS failure or compromise   Critical care was time spent personally by me on the following activities:  Development of treatment plan with patient or surrogate, discussions with consultants, evaluation of patient's response to treatment, examination of patient, ordering and review of laboratory studies, ordering and review of radiographic studies, ordering and performing treatments and interventions, pulse oximetry, re-evaluation of patient's condition, review of old charts and obtaining history from patient or surrogate   Care discussed with: admitting provider     (including critical care time)  Medical Decision Making / ED Course    Medical Decision Making:    Austin [name] is a 89 y.o. male with past medical history as below, significant for diastolic heart failure, DM, HLD, HTN, carotid artery stenosis, ESRD on HD Monday Wednesday Friday who presents to the ED with complaint of vision change. The complaint involves an extensive differential diagnosis and also carries with it a high risk of complications and morbidity.  Serious etiology was considered. Ddx includes but is not limited to: Infection, infarction, carotid stenosis, neoplasm, central retinal vein occlusion, medication effect, etc.  Complete  initial physical exam performed, notably the patient was in no acute stress, HDS.    Reviewed and confirmed nursing documentation for past medical history, family history, social history.  Vital signs reviewed.    Acute CVA  Vision change/Homonymous hemianopia LEFT > - Last known normal approximately 3 to 4 days ago - Neuroexam with visual field cut left - MRI with acute infarct of the right PCA, remote infarct of right basal ganglia, remote lacunar infarct left thalamus, abnormality noted to left parietal lobe - Consult neurology (Dr Voncile, will see), consult nephrology (Dr Norine), admit hospitalist for stroke workup    ESRD on HD > - MWF - does not appear to require acute HD tonight - spoke w/ Dr Norine, will arrange for HD tomorrow   Admit [facility]              Additional history obtained: -Additional history obtained from family -External records from outside source obtained and reviewed including: Chart review including previous notes, labs, imaging, consultation notes including  Home medications, prior ER evaluation, prior labs   Lab Tests: -I ordered, reviewed, and interpreted labs.   The pertinent results include:   Labs Reviewed  APTT - Abnormal; Notable for the following components:      Result Value   aPTT 37 (*)    All other components within normal limits  CBC - Abnormal; Notable for the following components:   RBC 4.16 (*)    Hemoglobin 12.1 (*)    HCT 37.8 (*)    RDW 16.6 (*)    Platelets 433 (*)    All other components within normal limits  I-STAT CHEM 8, ED - Abnormal; Notable for the following components:   BUN 47 (*)    Creatinine, Ser 8.20 (*)    Glucose, Bld 153 (*)    Calcium , Ion 1.09 (*)    All other components within normal limits  PROTIME-INR  DIFFERENTIAL  ETHANOL  COMPREHENSIVE METABOLIC PANEL WITH GFR  RAPID URINE DRUG  SCREEN, HOSP PERFORMED    Notable for labs stable  EKG   EKG Interpretation Date/Time:  Thursday April 25 2024 16:13:35 EDT Ventricular Rate:  76 PR Interval:  194 QRS Duration:  88 QT Interval:  386 QTC Calculation: 434 R Axis:   -39  Text Interpretation: Normal sinus rhythm Left axis deviation Minimal voltage criteria for LVH, may be normal variant ( R in aVL ) Abnormal ECG When compared with ECG of 25-Oct-2022 05:48, PREVIOUS ECG IS PRESENT Confirmed by Elnor Savant ([id number]) on 04/25/2024 7:59:24 PM         Imaging Studies ordered: I ordered imaging studies including CTH w/o, MRI w/o I independently visualized the following imaging with scope of interpretation limited to determining acute life threatening conditions related to emergency care; findings noted above I agree with the radiologist interpretation If any imaging was obtained with contrast I closely monitored patient for any possible adverse reaction a/w contrast administration in the emergency department   Medicines ordered and prescription drug management: No orders of the defined types were placed in this encounter.   -I have reviewed the patients home medicines and have made adjustments as needed   Consultations Obtained: I requested consultation with the neurology, nephrology, hospitalist,  and discussed lab and imaging findings as well as pertinent plan - they recommend: admit   Cardiac Monitoring: The patient was maintained on a cardiac monitor.  I personally viewed and interpreted the cardiac monitored which showed an underlying rhythm of: nsr Continuous pulse oximetry interpreted by myself, 99% on RA.    Social Determinants of Health:  Diagnosis or treatment significantly limited by social determinants of health: former smoker   Reevaluation: After the interventions noted above, I reevaluated the patient and found that they have stayed the same  Co morbidities that complicate the patient evaluation  Past Medical History:  Diagnosis Date   Acute diastolic CHF (congestive heart failure) (HCC) 09/17/2020    Acute exacerbation of CHF (congestive heart failure) (HCC) 08/04/2019   ANEMIA DUE TO CHRONIC BLOOD LOSS 03/13/2007   CAROTID ARTERY STENOSIS 05/10/2010   CHF (congestive heart failure) (HCC)    DIABETES MELLITUS, TYPE II 09/19/2007   DISEASE, CEREBROVASCULAR NEC 03/05/2007   GERD 03/13/2007   HYPERLIPIDEMIA 03/05/2007   HYPERTENSION 03/05/2007   HYPOKALEMIA 11/09/2009   KNEE PAIN, RIGHT 11/09/2009   PROSTATE CANCER, HX OF 03/05/2007   RENAL DISEASE, CHRONIC 02/03/2009      Dispostion: Disposition decision including need for hospitalization was considered, and patient admitted to the hospital.    Final Clinical Impression(s) / ED Diagnoses Final diagnoses:  Acute CVA (cerebrovascular accident) [facility])  ESRD on hemodialysis (HCC)        Elnor Savant LABOR, DO 04/25/24 2103

## 2024-04-25 NOTE — ED Triage Notes (Signed)
 Pt here from eye doctor with c/o loss/ decrease   vision in both eyes , started 4 days ago , sent over for MRI

## 2024-04-25 NOTE — Consult Note (Addendum)
 NEUROLOGY CONSULT NOTE   Date of service: April 25, 2024 Patient Name: Austin [name] MRN:  [id number] DOB:  August 09, 1935 Chief Complaint: visual disturbance Requesting Provider: Elnor Jayson LABOR, DO  History of Present Illness  Austin [name] is a 89 y.o. veteran of the [place]  [employer], with hx of ESRD on dialysis, diabetes, hypertension, hyperlipidemia, prior stroke with not much in terms of residual deficits, presents to the emergency department for evaluation of visual disturbance-which he reports his vision getting dimmed for the last 3 to 4 days.  He could not give me a definitive last known well but said 3 to 4 days ago he started noticing that he is not seeing very well with his eyes.  No tingling numbness weakness.  No headaches.  No chest pain shortness of breath.  He is compliant with his dialysis and his medications. In the emergency department he was evaluated with further imaging with the MRI of the brain revealing subacute right PCA infarct for which she is being admitted for further workup.  Neurology was consulted for recommendations on poststroke care.  LKW: 3-4 days ago-unable to pinpoint a clear time Modified rankin score: 1-No significant post stroke disability and can perform usual duties with stroke symptoms IV Thrombolysis: Outside the window EVT: Outside the window  NIHSS components Score: Comment  1a Level of Conscious 0[x]  1[]  2[]  3[]      1b LOC Questions 0[x]  1[]  2[]       1c LOC Commands 0[x]  1[]  2[]       2 Best Gaze 0[x]  1[]  2[]       3 Visual 0[]  1[]  2[x]  3[]      4 Facial Palsy 0[x]  1[]  2[]  3[]      5a Motor Arm - left 0[x]  1[]  2[]  3[]  4[]  UN[]    5b Motor Arm - Right 0[x]  1[]  2[]  3[]  4[]  UN[]    6a Motor Leg - Left 0[x]  1[]  2[]  3[]  4[]  UN[]    6b Motor Leg - Right 0[x]  1[]  2[]  3[]  4[]  UN[]    7 Limb Ataxia 0[x]  1[]  2[]  UN[]      8 Sensory 0[x]  1[]  2[]  UN[]      9 Best Language 0[x]  1[]  2[]  3[]      10 Dysarthria 0[x]  1[]  2[]  UN[]      11 Extinct. and Inattention 0[x]   1[]  2[]       TOTAL: 2      ROS  Comprehensive ROS performed and pertinent positives documented in HPI    Past History   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 BLOOD LOSS 03/13/2007   CAROTID ARTERY STENOSIS 05/10/2010   CHF (congestive heart failure) (HCC)    DIABETES MELLITUS, TYPE II 09/19/2007   DISEASE, CEREBROVASCULAR NEC 03/05/2007   GERD 03/13/2007   HYPERLIPIDEMIA 03/05/2007   HYPERTENSION 03/05/2007   HYPOKALEMIA 11/09/2009   KNEE PAIN, RIGHT 11/09/2009   PROSTATE CANCER, HX OF 03/05/2007   RENAL DISEASE, CHRONIC 02/03/2009    Past Surgical History:  Procedure Laterality Date   A/V FISTULAGRAM N/A 03/21/2024   Procedure: A/V Fistulagram;  Surgeon: Magda Debby SAILOR, MD;  Location: [facility];  Service: Cardiovascular;  Laterality: N/A;   AV FISTULA PLACEMENT Left 10/18/2022   Procedure: INSERTION OF LEFT ARM BRACHIAL ARTERY TO AXILLARY VEIN ARTERIOVENOUS (AV) GORE-TEX GRAFT;  Surgeon: Lanis Fonda BRAVO, MD;  Location: [facility];  Service: Vascular;  Laterality: Left;  CAROTID ARTERY ANGIOPLASTY Right Oct. 10, 2001   ESOPHAGOGASTRODUODENOSCOPY (EGD) WITH PROPOFOL  N/A 11/04/2016   Procedure: ESOPHAGOGASTRODUODENOSCOPY (EGD) WITH PROPOFOL ;  Surgeon: Layla Lah, MD;  Location: [facility];  Service: Gastroenterology;  Laterality: N/A;   IR FLUORO GUIDE CV LINE RIGHT  10/25/2022   IR FLUORO GUIDE CV LINE RIGHT  10/27/2022   IR US  GUIDE VASC ACCESS RIGHT  10/25/2022   IR US  GUIDE VASC ACCESS RIGHT  10/27/2022   LAPAROSCOPIC APPENDECTOMY  02/20/2012   Procedure: APPENDECTOMY LAPAROSCOPIC;  Surgeon: Jina Nephew, MD;  Location: [facility] OR;  Service: General;  Laterality: N/A;   PROSTATE SURGERY     prostatectomy    Family History: Family History  Problem Relation Age of Onset   Hypertension Mother    Cancer Father         Mesothelioma    Stomach cancer Brother    Cancer Brother    Cancer - Cervical Brother    Cancer Brother    Diabetes Brother    Esophageal cancer Neg Hx    Colon cancer Neg Hx    Pancreatic cancer Neg Hx     Social History  reports that he quit smoking about 49 years ago. His smoking use included cigarettes. He started smoking about 59 years ago. He has a 10 pack-year smoking history. He has never used smokeless tobacco. He reports that he does not drink alcohol  and does not use drugs.  Allergies  Allergen Reactions   Aspirin  Other (See Comments)    High doses causes stomach ulcer and bleeding    Medications   Current Facility-Administered Medications:    aspirin  chewable tablet 324 mg, 324 mg, Oral, Once, Elnor Jayson LABOR, DO  Current Outpatient Medications:    Accu-Chek Softclix Lancets lancets, USED TO CHECK BLOOD GLUCOSE TWICE A DAY OR AS NEEDED, Disp: 100 each, Rfl: 6   acetaminophen  (TYLENOL ) 500 MG tablet, Take 1,000 mg by mouth every 4 (four) hours as needed., Disp: , Rfl:    albuterol  (VENTOLIN  HFA) 108 (90 Base) MCG/ACT inhaler, TAKE 2 PUFFS BY MOUTH EVERY 6 HOURS AS NEEDED FOR WHEEZE OR SHORTNESS OF BREATH, Disp: 8.5 each, Rfl: 5   amLODipine  (NORVASC ) 10 MG tablet, TAKE 1 TABLET BY MOUTH EVERY DAY, Disp: 90 tablet, Rfl: 3   atorvastatin  (LIPITOR) 20 MG tablet, TAKE 1 TABLET BY MOUTH EVERY DAY, Disp: 90 tablet, Rfl: 3   cetirizine  (ZYRTEC ) 5 MG tablet, Take 1 tablet (5 mg total) by mouth at bedtime., Disp: 60 tablet, Rfl: 0   cholecalciferol  (VITAMIN D3) 25 MCG (1000 UNIT) tablet, Take 1,000 Units by mouth daily., Disp: , Rfl:    cloNIDine  (CATAPRES ) 0.3 MG tablet, Take 1 tablet (0.3 mg total) by mouth 3 (three) times daily. (Patient taking differently: Take 0.3 mg by mouth 2 (two) times daily.), Disp: 60 tablet, Rfl: 0   clopidogrel  (PLAVIX ) 75 MG tablet, TAKE 1 TABLET BY MOUTH EVERY DAY, Disp: 90 tablet, Rfl: 3   doxazosin  (CARDURA ) 2 MG tablet, Take 2 mg by mouth at  bedtime. , Disp: , Rfl: 3   esomeprazole  (NEXIUM ) 40 MG capsule, TAKE 1 CAPSULE BY MOUTH EVERY DAY, Disp: 90 capsule, Rfl: 3   fluticasone  (FLONASE ) 50 MCG/ACT nasal spray, Place 1 spray into both nostrils daily., Disp: 9.9 mL, Rfl: 2   glucose blood (ACCU-CHEK GUIDE) test strip, USE TO CHECK BLOOD GLUCOSE TWICE A DAY OR AS NEEDED, Disp: 300 strip, Rfl: 1   guaiFENesin  (MUCINEX ) 600 MG 12 hr tablet, Take  600 mg by mouth at bedtime., Disp: , Rfl:    isosorbide -hydrALAZINE  (BIDIL ) 20-37.5 MG tablet, Take 1 tablet by mouth 3 (three) times daily., Disp: , Rfl:    ketoconazole  (NIZORAL ) 2 % cream, Apply 1 Application topically 2 (two) times daily., Disp: 60 g, Rfl: 2   lactulose  (CHRONULAC ) 10 GM/15ML solution, TAKE 30 ML BY MOUTH TWICE DAILY AS NEEDED, Disp: 237 mL, Rfl: 3   Melatonin 10 MG TABS, Take 10 mg by mouth at bedtime., Disp: , Rfl:    Methoxy PEG-Epoetin  Beta (MIRCERA IJ), Mircera, Disp: , Rfl:    methylcellulose (CITRUCEL) oral powder, 1 tablespoon every 2-3 days,alternating with lactulose , Disp: , Rfl:    metolazone  (ZAROXOLYN ) 5 MG tablet, Take 5 mg by mouth daily as needed., Disp: , Rfl:    neomycin-polymyxin b-dexamethasone  (MAXITROL) 3.5-10000-0.1 SUSP, Place 1 drop into both eyes 4 (four) times daily., Disp: , Rfl:    predniSONE  (DELTASONE ) 20 MG tablet, Take 1 tablet (20 mg total) by mouth daily., Disp: 5 tablet, Rfl: 0   Propylene Glycol (SYSTANE BALANCE OP), Place 1 drop into both eyes at bedtime., Disp: , Rfl:    sevelamer  carbonate (RENVELA ) 800 MG tablet, Take 800 mg by mouth 3 (three) times daily., Disp: , Rfl:    triamcinolone  cream (KENALOG ) 0.1 %, Apply 1 application topically daily as needed (dry/irritated skin)., Disp: , Rfl:   Vitals   Vitals:   05-21-24 1948 05/21/24 1948 2024-05-21 2030 2024/05/21 2136  BP: (!) 161/58 (!) 161/58 (!) 171/59   Pulse: 82 82 93   Resp: 16 16 14    Temp: 98.8 F (37.1 C) 98.8 F (37.1 C)    SpO2: 95% 95% 99% 98%    There is no height  or weight on file to calculate BMI.   Physical Exam  General: Well-developed well-nourished man in no acute distress HEENT: Normocephalic atraumatic Lungs: Clear Cardiovascular: Regular rhythm Abdomen nondistended nontender Neurological exam Awake alert oriented x 3, no dysarthria, no aphasia Cranial nerve examination reveals equal pupils that are round and reactive to light, intact extraocular movements, left homonymous hemianopsia, intact facial sensation, symmetric facies, tongue and palate midline. Motor examination reveals no drift in any of the 4 extremities with symmetric strength all over Sensation is intact to light touch without extinction Coordination examination reveals no dysmetria-has mild tremor on the left hand with action worse than right  Labs/Imaging/Neurodiagnostic studies   CBC:  Recent Labs  Lab 05-21-24 2035 2024/05/21 2036  WBC 10.1  --   NEUTROABS 7.7  --   HGB 12.1* 13.9  HCT 37.8* 41.0  MCV 90.9  --   PLT 433*  --    Basic Metabolic Panel:  Lab Results  Component Value Date   NA 138 05/21/2024   K 3.7 May 21, 2024   CO2 24 05-21-24   GLUCOSE 153 (H) 2024-05-21   BUN 47 (H) May 21, 2024   CREATININE 8.20 (H) 21-May-2024   CALCIUM  9.8 2024/05/21   GFRNONAA 6 (L) 05-21-2024   GFRAA 19 (L) 02/26/2020   Lipid Panel:  Lab Results  Component Value Date   LDLCALC 53 03/07/2024   HgbA1c:  Lab Results  Component Value Date   HGBA1C 7.6 (H) 03/07/2024    Alcohol  Level     Component Value Date/Time   [facility] <15 05/21/2024 2035   INR  Lab Results  Component Value Date   INR 1.0 2024/05/21   APTT  Lab Results  Component Value Date   APTT 37 (H) 05/21/2024  Imaging personally reviewed CT head: NAICP MR brain: acute to subacute right PCA stroke.  Focus of signal abnormality and susceptibility within the left parietal lobe-possibly representing remote hemorrhage or vascular malformation.  Additional similar focus of signal abnormality  susceptibility along the right dorsal aspect of pons.  Can be also related to chronic hypertensive changes  ASSESSMENT   Austin [name] is a 89 y.o. male with a history as above presenting for evaluation of visual disturbance on exam has left homonymous hemianopsia.  MRI reveals a right PCA late acute to early subacute infarct.  Given his history, either a large vessel atheroembolic versus cardioembolic stroke is likely. Needs further workup Incidentally, few foci of remote hemorrhage or vascular malformation noticed in the left parietal lobe and dorsal aspect of the right pons.  Likely related to chronic hypertension but could be vascular malformations as well.  At this time do not need emergent follow-up.  Impression: Acute ischemic stroke involving the right PCA territory.  RECOMMENDATIONS  -Admit to hospitalist -Telemetry monitoring -No need for permissive HTN. BP goal normotension. Avoid hypotension. -CT Angiogram of Head and neck -Echocardiogram -HgbA1c, fasting lipid panel -Frequent neuro checks -Prophylactic therapy-Antiplatelet med: on plavix  at home. Continue that  for now. He was on ASA at one point and has GI bleed/ulcer and has been asked not to take it. -Atorvastatin  80 mg PO daily -Risk factor modification -PT consult, OT consult, Speech consult Plan discussed with Dr. Elnor Stroke team to follow ______________________________________________________________________  Signed, Eligio Lav, MD [facility]

## 2024-04-25 NOTE — ED Provider Triage Note (Signed)
 Emergency Medicine Provider Triage Evaluation Note  Austin [name] , a 89 y.o. male  was evaluated in triage.  Pt complains of vision change.  Ongoing for 3 to 4 days.  Was seen by retinal specialist and referred to the emergency department for stroke workup.  Review of Systems  Positive: As above Negative: As above  Physical Exam  BP (!) 133/51 (BP Location: Right Arm)   Pulse 79   Temp 98.1 F (36.7 C)   Resp 16   SpO2 97%  Gen:   Awake, no distress   Resp:  Normal effort  MSK:   Moves extremities without difficulty  Other:  Cranial nerves III through XII intact.  No pronator drift.  Good strength in upper and lower extremities.  Normal speech.  Medical Decision Making  Medically screening exam initiated at 3:52 PM.  Appropriate orders placed.  Austin [name] was informed that the remainder of the evaluation will be completed by another provider, this initial triage assessment does not replace that evaluation, and the importance of remaining in the ED until their evaluation is complete.     Hildegard Loge, PA-C 04/25/24 1615

## 2024-04-25 NOTE — ED Notes (Signed)
 CCMD called.

## 2024-04-25 NOTE — ED Triage Notes (Signed)
 Patient in ED for a MRI. Patient states that on Saturday his vision started getting dimmer. Patient states that bilateral eyes are sore more the right than the left.

## 2024-04-26 ENCOUNTER — Inpatient Hospital Stay (HOSPITAL_COMMUNITY)

## 2024-04-26 ENCOUNTER — Inpatient Hospital Stay (HOSPITAL_BASED_OUTPATIENT_CLINIC_OR_DEPARTMENT_OTHER)

## 2024-04-26 DIAGNOSIS — I120 Hypertensive chronic kidney disease with stage 5 chronic kidney disease or end stage renal disease: Secondary | ICD-10-CM | POA: Diagnosis not present

## 2024-04-26 DIAGNOSIS — I6389 Other cerebral infarction: Secondary | ICD-10-CM | POA: Diagnosis not present

## 2024-04-26 DIAGNOSIS — I6502 Occlusion and stenosis of left vertebral artery: Secondary | ICD-10-CM | POA: Diagnosis not present

## 2024-04-26 DIAGNOSIS — Z23 Encounter for immunization: Secondary | ICD-10-CM | POA: Diagnosis not present

## 2024-04-26 DIAGNOSIS — N186 End stage renal disease: Secondary | ICD-10-CM | POA: Diagnosis not present

## 2024-04-26 DIAGNOSIS — E1122 Type 2 diabetes mellitus with diabetic chronic kidney disease: Secondary | ICD-10-CM | POA: Diagnosis not present

## 2024-04-26 DIAGNOSIS — H547 Unspecified visual loss: Secondary | ICD-10-CM | POA: Diagnosis not present

## 2024-04-26 DIAGNOSIS — Z992 Dependence on renal dialysis: Secondary | ICD-10-CM | POA: Diagnosis not present

## 2024-04-26 DIAGNOSIS — I5031 Acute diastolic (congestive) heart failure: Secondary | ICD-10-CM | POA: Diagnosis not present

## 2024-04-26 DIAGNOSIS — I779 Disorder of arteries and arterioles, unspecified: Secondary | ICD-10-CM | POA: Diagnosis not present

## 2024-04-26 DIAGNOSIS — E1169 Type 2 diabetes mellitus with other specified complication: Secondary | ICD-10-CM

## 2024-04-26 DIAGNOSIS — D631 Anemia in chronic kidney disease: Secondary | ICD-10-CM | POA: Diagnosis not present

## 2024-04-26 DIAGNOSIS — E785 Hyperlipidemia, unspecified: Secondary | ICD-10-CM | POA: Diagnosis not present

## 2024-04-26 DIAGNOSIS — I10 Essential (primary) hypertension: Secondary | ICD-10-CM

## 2024-04-26 DIAGNOSIS — I998 Other disorder of circulatory system: Secondary | ICD-10-CM

## 2024-04-26 DIAGNOSIS — I6523 Occlusion and stenosis of bilateral carotid arteries: Secondary | ICD-10-CM | POA: Diagnosis not present

## 2024-04-26 DIAGNOSIS — I639 Cerebral infarction, unspecified: Secondary | ICD-10-CM | POA: Diagnosis not present

## 2024-04-26 DIAGNOSIS — R29702 NIHSS score 2: Secondary | ICD-10-CM | POA: Diagnosis not present

## 2024-04-26 DIAGNOSIS — I509 Heart failure, unspecified: Secondary | ICD-10-CM | POA: Diagnosis not present

## 2024-04-26 DIAGNOSIS — I132 Hypertensive heart and chronic kidney disease with heart failure and with stage 5 chronic kidney disease, or end stage renal disease: Secondary | ICD-10-CM | POA: Diagnosis not present

## 2024-04-26 DIAGNOSIS — E042 Nontoxic multinodular goiter: Secondary | ICD-10-CM | POA: Diagnosis not present

## 2024-04-26 DIAGNOSIS — I672 Cerebral atherosclerosis: Secondary | ICD-10-CM | POA: Diagnosis not present

## 2024-04-26 DIAGNOSIS — H5347 Heteronymous bilateral field defects: Secondary | ICD-10-CM

## 2024-04-26 DIAGNOSIS — I63531 Cerebral infarction due to unspecified occlusion or stenosis of right posterior cerebral artery: Secondary | ICD-10-CM

## 2024-04-26 DIAGNOSIS — Z8616 Personal history of COVID-19: Secondary | ICD-10-CM | POA: Diagnosis not present

## 2024-04-26 DIAGNOSIS — E1151 Type 2 diabetes mellitus with diabetic peripheral angiopathy without gangrene: Secondary | ICD-10-CM | POA: Diagnosis not present

## 2024-04-26 LAB — ECHOCARDIOGRAM COMPLETE
AR max vel: 2.58 cm2
AV Area VTI: 3.31 cm2
AV Area mean vel: 2.77 cm2
AV Mean grad: 9.3 mmHg
AV Peak grad: 25.3 mmHg
Ao pk vel: 2.52 m/s
Area-P 1/2: 3.63 cm2
Calc EF: 60.7 %
Est EF: 55
Height: 67 in
MV VTI: 3.58 cm2
P 1/2 time: 633 ms
S' Lateral: 3.4 cm
Single Plane A2C EF: 63.4 %
Single Plane A4C EF: 58.5 %
Weight: 2684.8 [oz_av]

## 2024-04-26 LAB — GLUCOSE, CAPILLARY
Glucose-Capillary: 146 mg/dL — ABNORMAL HIGH (ref 70–99)
Glucose-Capillary: 148 mg/dL — ABNORMAL HIGH (ref 70–99)
Glucose-Capillary: 175 mg/dL — ABNORMAL HIGH (ref 70–99)
Glucose-Capillary: 182 mg/dL — ABNORMAL HIGH (ref 70–99)

## 2024-04-26 LAB — HEPATITIS B SURFACE ANTIGEN: Hepatitis B Surface Ag: NONREACTIVE

## 2024-04-26 MED ORDER — ASPIRIN 81 MG PO TBEC
81.0000 mg | DELAYED_RELEASE_TABLET | Freq: Every day | ORAL | Status: DC
  Administered 2024-04-26 – 2024-04-27 (×2): 81 mg via ORAL
  Filled 2024-04-26 (×2): qty 1

## 2024-04-26 MED ORDER — SEVELAMER CARBONATE 800 MG PO TABS
800.0000 mg | ORAL_TABLET | Freq: Three times a day (TID) | ORAL | Status: DC
  Administered 2024-04-26 – 2024-04-28 (×5): 800 mg via ORAL
  Filled 2024-04-26 (×5): qty 1

## 2024-04-26 MED ORDER — POLYVINYL ALCOHOL 1.4 % OP SOLN
1.0000 [drp] | Freq: Every day | OPHTHALMIC | Status: DC
  Administered 2024-04-26 – 2024-04-27 (×3): 1 [drp] via OPHTHALMIC
  Filled 2024-04-26: qty 15

## 2024-04-26 MED ORDER — CLOPIDOGREL BISULFATE 75 MG PO TABS
75.0000 mg | ORAL_TABLET | Freq: Every day | ORAL | Status: DC
  Administered 2024-04-26 – 2024-04-27 (×2): 75 mg via ORAL
  Filled 2024-04-26 (×2): qty 1

## 2024-04-26 MED ORDER — AMLODIPINE BESYLATE 5 MG PO TABS
10.0000 mg | ORAL_TABLET | Freq: Every day | ORAL | Status: DC
  Administered 2024-04-26 – 2024-04-28 (×3): 10 mg via ORAL
  Filled 2024-04-26 (×3): qty 2

## 2024-04-26 MED ORDER — INSULIN ASPART 100 UNIT/ML IJ SOLN
0.0000 [IU] | Freq: Three times a day (TID) | INTRAMUSCULAR | Status: DC
  Administered 2024-04-26 – 2024-04-27 (×2): 1 [IU] via SUBCUTANEOUS

## 2024-04-26 MED ORDER — STROKE: EARLY STAGES OF RECOVERY BOOK
Freq: Once | Status: AC
  Filled 2024-04-26: qty 1

## 2024-04-26 MED ORDER — CHLORHEXIDINE GLUCONATE CLOTH 2 % EX PADS
6.0000 | MEDICATED_PAD | Freq: Every day | CUTANEOUS | Status: DC
  Administered 2024-04-27: 6 via TOPICAL

## 2024-04-26 MED ORDER — CLONIDINE HCL 0.1 MG PO TABS
0.3000 mg | ORAL_TABLET | Freq: Three times a day (TID) | ORAL | Status: DC
Start: 2024-04-26 — End: 2024-04-26

## 2024-04-26 MED ORDER — ACETAMINOPHEN 325 MG PO TABS
650.0000 mg | ORAL_TABLET | Freq: Four times a day (QID) | ORAL | Status: DC | PRN
  Administered 2024-04-26: 650 mg via ORAL
  Filled 2024-04-26: qty 2

## 2024-04-26 MED ORDER — IOHEXOL 350 MG/ML SOLN
150.0000 mL | Freq: Once | INTRAVENOUS | Status: AC | PRN
  Administered 2024-04-26: 150 mL via INTRAVENOUS

## 2024-04-26 MED ORDER — ATORVASTATIN CALCIUM 10 MG PO TABS
20.0000 mg | ORAL_TABLET | Freq: Every day | ORAL | Status: DC
  Administered 2024-04-26 – 2024-04-28 (×3): 20 mg via ORAL
  Filled 2024-04-26 (×3): qty 2

## 2024-04-26 MED ORDER — LACTULOSE 10 GM/15ML PO SOLN: 10.0000 g | Freq: Two times a day (BID) | ORAL | Status: DC | PRN

## 2024-04-26 MED ORDER — INFLUENZA VAC SPLIT HIGH-DOSE 0.5 ML IM SUSY
0.5000 mL | PREFILLED_SYRINGE | INTRAMUSCULAR | Status: AC
  Administered 2024-04-27: 0.5 mL via INTRAMUSCULAR
  Filled 2024-04-26: qty 0.5

## 2024-04-26 MED ORDER — ISOSORB DINITRATE-HYDRALAZINE 20-37.5 MG PO TABS
1.0000 | ORAL_TABLET | Freq: Three times a day (TID) | ORAL | Status: DC
  Administered 2024-04-26 – 2024-04-28 (×7): 1 via ORAL
  Filled 2024-04-26 (×7): qty 1

## 2024-04-26 MED ORDER — PANTOPRAZOLE SODIUM 40 MG PO TBEC
40.0000 mg | DELAYED_RELEASE_TABLET | Freq: Every day | ORAL | Status: DC
  Administered 2024-04-26 – 2024-04-28 (×3): 40 mg via ORAL
  Filled 2024-04-26 (×3): qty 1

## 2024-04-26 MED ORDER — DOXAZOSIN MESYLATE 2 MG PO TABS
2.0000 mg | ORAL_TABLET | Freq: Every day | ORAL | Status: DC
  Administered 2024-04-26 – 2024-04-27 (×2): 2 mg via ORAL
  Filled 2024-04-26 (×3): qty 1

## 2024-04-26 NOTE — TOC Initial Note (Signed)
 Transition of Care [facility]) - Initial/Assessment Note    Patient Details  Name: Austin [name] MRN: [id number] Date of Birth: 08/23/1934  [facility] of Care [facility]) CM/SW Contact:    Andrez JULIANNA George, RN Phone Number: 04/26/2024, 11:25 AM  Clinical Narrative:                  Pt is from home with his spouse. They are together most of the time.  Pt uses city transportation for his HD.  Pt uses oxygen  as needed at home through [employer].  Both manage his medications at home.  Wife provides other needed transportation.  IP Care management following.  Expected Discharge Plan: Home/Self Care Barriers to Discharge: Continued Medical Work up   Patient Goals and CMS Choice            Expected Discharge Plan and Services   Discharge Planning Services: CM Consult   Living arrangements for the past 2 months: Single Family Home                                      Prior Living Arrangements/Services Living arrangements for the past 2 months: Single Family Home Lives with:: Spouse Patient language and need for interpreter reviewed:: Yes Do you feel safe going back to the place where you live?: Yes        Care giver support system in place?: Yes (comment) Current home services: DME (cane and oxygen  with [employer]) Criminal Activity/Legal Involvement Pertinent to Current Situation/Hospitalization: No - Comment as needed  Activities of Daily Living   ADL Screening (condition at time of admission) Independently performs ADLs?: Yes (appropriate for developmental age) Is the patient deaf or have difficulty hearing?: No Does the patient have difficulty seeing, even when wearing glasses/contacts?: No Does the patient have difficulty concentrating, remembering, or making decisions?: No  Permission Sought/Granted                  Emotional Assessment Appearance:: Appears stated age Attitude/Demeanor/Rapport: Engaged Affect (typically observed): Accepting Orientation: :  Oriented to Self, Oriented to Place, Oriented to  Time, Oriented to Situation      Admission diagnosis:  ESRD on hemodialysis (HCC) [N18.6, Z99.2] Acute CVA (cerebrovascular accident) [facility]) [I63.9] Patient Active Problem List   Diagnosis Date Noted   Acute CVA (cerebrovascular accident) (HCC) 04/25/2024   Lumbar facet arthropathy 04/16/2024   Secondary hyperparathyroidism of renal origin (HCC) 06/02/2023   Preoperative evaluation of a medical condition to rule out surgical contraindications (TAR required) 05/04/2023   Right carpal tunnel syndrome 03/16/2023   Coagulation defect, unspecified (HCC) 11/07/2022   Personal history of nicotine dependence 11/01/2022   ESRD on dialysis (HCC) 10/29/2022   Protein-calorie malnutrition, severe 10/28/2022   Anemia in chronic kidney disease 10/28/2022   Hyperlipidemia, unspecified 10/28/2022   Pneumonia due to COVID-19 virus 10/25/2022   CKD (chronic kidney disease) stage 5, GFR less than 15 ml/min (HCC) 10/25/2022   Severe pulmonary hypertension (HCC) 10/25/2022   Moderate aortic regurgitation 10/25/2022   CAD in native artery 10/25/2022   UTI (urinary tract infection) 09/27/2021   CHF exacerbation (HCC) 09/26/2021   Acute respiratory failure with hypoxia (HCC) 09/26/2021   Acute on chronic diastolic CHF (congestive heart failure) (HCC) 07/09/2021   Dyspnea 06/01/2021   Upper airway cough syndrome 04/06/2021   Porokeratosis 03/26/2021   Pulmonary nodules 10/22/2020   Acute on chronic renal failure (HCC) 10/09/2020  Mass of right lung 09/18/2020   Acute kidney injury superimposed on CKD (HCC) 08/04/2019   Elevated troponin 08/04/2019   Hypokalemia 08/04/2019   Hypertensive urgency 03/28/2019   Elevated serum immunoglobulin free light chains 02/20/2019   Gastrointestinal hemorrhage with melena    Melena 11/03/2016   Carotid artery stenosis 05/10/2010   Type 2 diabetes mellitus with hyperlipidemia (HCC) 09/19/2007   Iron  deficiency  anemia due to chronic blood loss 03/13/2007   GERD 03/13/2007   Essential hypertension 03/05/2007   DISEASE, CEREBROVASCULAR NEC 03/05/2007   PROSTATE CANCER, HX OF 03/05/2007   PCP:  Merna Huxley, NP Pharmacy:   [facility] [phone] - [place], [place] - [address] AT [address] OF [facility] [address] [address] [place] [address] Phone: [phone] Fax: [phone]  [facility] [address] [place] [address] Phone: [phone] Fax: [phone]     Social Drivers of Health (SDOH) Social History: SDOH Screenings   Food Insecurity: No Food Insecurity (04/26/2024)  Housing: Low Risk  (04/26/2024)  Transportation Needs: No Transportation Needs (04/26/2024)  Utilities: Not At Risk (04/26/2024)  Alcohol  Screen: Low Risk  (02/09/2023)  Depression (PHQ2-9): Low Risk  (03/07/2024)  Financial Resource Strain: Low Risk  (02/09/2023)  Physical Activity: Insufficiently Active (02/09/2023)  Social Connections: Unknown (04/26/2024)  Stress: No Stress Concern Present (02/09/2023)  Tobacco Use: Medium Risk (04/25/2024)   SDOH Interventions:     Readmission Risk Interventions    10/28/2022    2:49 PM 09/30/2021   11:17 AM  Readmission Risk Prevention Plan  Transportation Screening Complete Complete  PCP or Specialist Appt within 5-7 Days Complete   PCP or Specialist Appt within 3-5 Days  Complete  Home Care Screening Complete   Medication Review (RN CM) Complete   HRI or Home Care Consult  Complete  Social Work Consult for Recovery Care Planning/Counseling  Complete  Palliative Care Screening  Not Applicable  Medication Review Ocean[facility])  Complete

## 2024-04-26 NOTE — TOC CAGE-AID Note (Signed)
 Transition of Care [facility]) - CAGE-AID Screening   Patient Details  Name: Austin [name] MRN: [id number] Date of Birth: [birth year]  Transition [facility] Care [facility]) CM/SW Contact:    Araeya Lamb E Cali Cuartas, LCSW Phone Number: 04/26/2024, 9:14 AM   Clinical Narrative: No SA noted.   CAGE-AID Screening:    Have You Ever Felt You Ought to Cut Down on Your Drinking or Drug Use?: No Have People Annoyed You By Critizing Your Drinking Or Drug Use?: No Have You Felt Bad Or Guilty About Your Drinking Or Drug Use?: No Have You Ever Had a Drink or Used Drugs First Thing In The Morning to Steady Your Nerves or to Get Rid of a Hangover?: No CAGE-AID Score: 0  Substance Abuse Education Offered: No

## 2024-04-26 NOTE — Progress Notes (Addendum)
 STROKE TEAM PROGRESS NOTE   INTERIM HISTORY/SUBJECTIVE  Family at the bedside.  Patient states he had a couple days of dimmed vision presented to the emergency room for evaluation.  MRI brain with subacute right PCA infarct  He denies any weakness, facial droop, slurred speech.  He states his vision has improved mostly Echo was completed with a EF of 55% with grade 1 diastolic dysfunction, left atrium is severely dilated  CBC    Component Value Date/Time   WBC 10.1 04/25/2024 2035   RBC 4.16 (L) 04/25/2024 2035   HGB 13.9 04/25/2024 2036   HGB 9.7 (L) 08/19/2019 1107   HCT 41.0 04/25/2024 2036   PLT 433 (H) 04/25/2024 2035   PLT 262 08/19/2019 1107   MCV 90.9 04/25/2024 2035   MCH 29.1 04/25/2024 2035   MCHC 32.0 04/25/2024 2035   RDW 16.6 (H) 04/25/2024 2035   LYMPHSABS 1.4 04/25/2024 2035   MONOABS 0.8 04/25/2024 2035   EOSABS 0.2 04/25/2024 2035   BASOSABS 0.1 04/25/2024 2035    BMET    Component Value Date/Time   NA 138 04/25/2024 2036   NA 138 12/02/2019 0000   K 3.7 04/25/2024 2036   CL 99 04/25/2024 2036   CO2 24 04/25/2024 2035   GLUCOSE 153 (H) 04/25/2024 2036   GLUCOSE 112 (H) 06/19/2006 0948   BUN 47 (H) 04/25/2024 2036   BUN 29 (A) 12/02/2019 0000   CREATININE 8.20 (H) 04/25/2024 2036   CREATININE 3.59 (H) 03/03/2020 1454   CALCIUM  9.8 04/25/2024 2035   EGFR 9.0 09/22/2022 1416   GFRNONAA 6 (L) 04/25/2024 2035   GFRNONAA 20 (L) 08/19/2019 1107    IMAGING past 24 hours ECHOCARDIOGRAM COMPLETE Result Date: 04/26/2024    ECHOCARDIOGRAM REPORT   Patient Name:   Austin [name] Date of Exam: 04/26/2024 Medical Rec #:  [id number]  Height:       67.0 in Accession #:    [id number] Weight:       167.8 lb Date of Birth:  Nov 29, 1934  BSA:          1.877 m Patient Age:    89 years   BP:           160/61 mmHg Patient Gender: M          HR:           89 bpm. Exam Location:  Inpatient Procedure: 2D Echo, Cardiac Doppler and Color Doppler (Both Spectral and Color            Flow  Doppler were utilized during procedure). Indications:    Stroke I63.9  History:        Patient has prior history of Echocardiogram examinations, most                 recent 10/18/2021. CHF; Risk Factors:Hypertension and Diabetes.  Sonographer:    BERNARDA ROCKS Referring Phys: [id number] Magon Croson IMPRESSIONS  1. Distal septal hypokinesis. Left ventricular ejection fraction, by estimation, is 55%. The left ventricle has low normal function. The left ventricle has no regional wall motion abnormalities. Left ventricular diastolic parameters are consistent with Grade I diastolic dysfunction (impaired relaxation).  2. Right ventricular systolic function is normal. The right ventricular size is normal.  3. Left atrial size was severely dilated.  4. The mitral valve is degenerative. Trivial mitral valve regurgitation. No evidence of mitral stenosis. Moderate mitral annular calcification.  5. The aortic valve is tricuspid. There is moderate calcification of the aortic valve.  There is moderate thickening of the aortic valve. Aortic valve regurgitation is trivial. Aortic valve sclerosis/calcification is present, without any evidence of aortic stenosis.  6. The inferior vena cava is normal in size with greater than 50% respiratory variability, suggesting right atrial pressure of 3 mmHg. FINDINGS  Left Ventricle: Distal septal hypokinesis. Left ventricular ejection fraction, by estimation, is 55%. The left ventricle has low normal function. The left ventricle has no regional wall motion abnormalities. Strain was performed and the global longitudinal strain is indeterminate. The left ventricular internal cavity size was normal in size. There is no left ventricular hypertrophy. Left ventricular diastolic parameters are consistent with Grade I diastolic dysfunction (impaired relaxation). Right Ventricle: The right ventricular size is normal. No increase in right ventricular wall thickness. Right ventricular systolic function is  normal. Left Atrium: Left atrial size was severely dilated. Right Atrium: Right atrial size was normal in size. Pericardium: There is no evidence of pericardial effusion. Mitral Valve: The mitral valve is degenerative in appearance. There is moderate thickening of the mitral valve leaflet(s). There is moderate calcification of the mitral valve leaflet(s). Moderate mitral annular calcification. Trivial mitral valve regurgitation. No evidence of mitral valve stenosis. MV peak gradient, 6.2 mmHg. The mean mitral valve gradient is 3.0 mmHg. Tricuspid Valve: The tricuspid valve is normal in structure. Tricuspid valve regurgitation is trivial. No evidence of tricuspid stenosis. Aortic Valve: The aortic valve is tricuspid. There is moderate calcification of the aortic valve. There is moderate thickening of the aortic valve. Aortic valve regurgitation is trivial. Aortic regurgitation PHT measures 633 msec. Aortic valve sclerosis/calcification is present, without any evidence of aortic stenosis. Aortic valve mean gradient measures 9.3 mmHg. Aortic valve peak gradient measures 25.3 mmHg. Aortic valve area, by VTI measures 3.31 cm. Pulmonic Valve: The pulmonic valve was normal in structure. Pulmonic valve regurgitation is trivial. No evidence of pulmonic stenosis. Aorta: The aortic root is normal in size and structure. Venous: The inferior vena cava is normal in size with greater than 50% respiratory variability, suggesting right atrial pressure of 3 mmHg. IAS/Shunts: No atrial level shunt detected by color flow Doppler. Additional Comments: 3D was performed not requiring image post processing on an independent workstation and was indeterminate.  LEFT VENTRICLE PLAX 2D LVIDd:         5.80 cm      Diastology LVIDs:         3.40 cm      LV e' medial:    6.85 cm/s LV PW:         1.00 cm      LV E/e' medial:  12.1 LV IVS:        1.00 cm      LV e' lateral:   11.50 cm/s LVOT diam:     2.30 cm      LV E/e' lateral: 7.2 LV SV:          130 LV SV Index:   69 LVOT Area:     4.15 cm  LV Volumes (MOD) LV vol d, MOD A2C: 172.0 ml LV vol d, MOD A4C: 181.0 ml LV vol s, MOD A2C: 62.9 ml LV vol s, MOD A4C: 75.2 ml LV SV MOD A2C:     109.1 ml LV SV MOD A4C:     181.0 ml LV SV MOD BP:      107.7 ml RIGHT VENTRICLE             IVC RV Basal diam:  4.30 cm  IVC diam: 1.40 cm RV S prime:     24.80 cm/s TAPSE (M-mode): 2.1 cm LEFT ATRIUM             Index        RIGHT ATRIUM           Index LA diam:        5.00 cm 2.66 cm/m   RA Area:     18.60 cm LA Vol (A2C):   87.8 ml 46.78 ml/m  RA Volume:   52.70 ml  28.08 ml/m LA Vol (A4C):   79.2 ml 42.20 ml/m LA Biplane Vol: 89.5 ml 47.69 ml/m  AORTIC VALVE                     PULMONIC VALVE AV Area (Vmax):    2.58 cm      PV Vmax:          1.00 m/s AV Area (Vmean):   2.77 cm      PV Peak grad:     4.0 mmHg AV Area (VTI):     3.31 cm      PR End Diast Vel: 0.53 msec AV Vmax:           251.67 cm/s AV Vmean:          134.000 cm/s AV VTI:            0.393 m AV Peak Grad:      25.3 mmHg AV Mean Grad:      9.3 mmHg LVOT Vmax:         156.00 cm/s LVOT Vmean:        89.200 cm/s LVOT VTI:          0.313 m LVOT/AV VTI ratio: 0.80 AI PHT:            633 msec  AORTA Ao Root diam: 3.50 cm Ao Asc diam:  3.70 cm MITRAL VALVE MV Area (PHT): 3.63 cm     SHUNTS MV Area VTI:   3.58 cm     Systemic VTI:  0.31 m MV Peak grad:  6.2 mmHg     Systemic Diam: 2.30 cm MV Mean grad:  3.0 mmHg MV Vmax:       1.24 m/s MV Vmean:      74.4 cm/s MV Decel Time: 209 msec MV E velocity: 82.60 cm/s MV A velocity: 121.00 cm/s MV E/A ratio:  0.68 Maude Emmer MD Electronically signed by Maude Emmer MD Signature Date/Time: 04/26/2024/11:12:59 AM    Final    MR BRAIN WO CONTRAST Result Date: 04/25/2024 EXAM: MRI BRAIN WITHOUT CONTRAST 04/25/2024 07:02:59 PM TECHNIQUE: Multiplanar multisequence MRI of the head/brain was performed without the administration of intravenous contrast. COMPARISON: Same day CT head and MRI head dated 12/12/2006.  CLINICAL HISTORY: Transient ischemic attack (TIA). FINDINGS: BRAIN AND VENTRICLES: Restricted diffusion throughout the cortex and subcortical white matter within the right occipital lobe involving the right PCA territory. Restricted diffusion also extends into the posterior right temporal lobe and right hippocampus. There are nonspecific hyperintense foci in the subcortical and periventricular white matter that most likely represent chronic microangiopathic ischemic changes in a patient of this age. Mild-to-moderate generalized parenchymal volume loss. There is a remote infarct in the right basal ganglia with evidence of prior hemorrhage. Additional remote lacunar infarcts in the left thalamus. Focus of similar signal abnormality and susceptibility within the left parietal lobe which may reflect focus of remote hemorrhage versus vascular malformations such as  cavernous malformation. There is an additional similar focus of signal abnormality and susceptibility along the right dorsal aspect of the pons at the level of the superior cerebellar peduncle. ORBITS: Bilateral lens replacement. SINUSES AND MASTOIDS: Mucosal thickening in the paranasal sinuses involving the right frontal sinus, right anterior ethmoidary cells, and right maxillary sinus. Paranasal sinus disease in a right ostiomeatal pattern. 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 IMPRESSION: 1. Acute infarct in the right PCA territory as above. 2. 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 3. Additional remote lacunar infarcts in the left thalamus. 4. Focus of signal abnormality and susceptibility within the left parietal lobe, possibly representing remote hemorrhage or vascular malformation such as cavernous malformation. Additional similar focus of signal abnormality and susceptibility along the right dorsal aspect of the pons. Electronically signed by:  Donnice Mania MD 04/25/2024 07:33 PM EDT RP Workstation: [id number]   CT HEAD WO CONTRAST Result Date: 04/25/2024 CLINICAL DATA:  Transient ischemic attack (TIA). Bilateral visual loss for 4 days. EXAM: CT HEAD WITHOUT CONTRAST TECHNIQUE: Contiguous axial images were obtained from the base of the skull through the vertex without intravenous contrast. RADIATION DOSE REDUCTION: This exam was performed according to the departmental dose-optimization program which includes automated exposure control, adjustment of the mA and/or kV according to patient size and/or use of iterative reconstruction technique. COMPARISON:  Head CT 01/25/2024 and MRI 12/12/2006 FINDINGS: Brain: There is no evidence of an acute infarct, intracranial hemorrhage, mass, midline shift, or extra-axial fluid collection. There is mild cerebral atrophy. Patchy cerebral white matter hypodensities are similar to the prior CT and nonspecific but compatible with moderate chronic small vessel ischemic disease. A chronic lacunar infarct in the right basal ganglia is unchanged. A normal variant cavum septum pellucidum et vergae is noted. Vascular: Calcified atherosclerosis at the skull base. No hyperdense vessel. Skull: No fracture or suspicious lesion. Sinuses/Orbits: Chronic right maxillary sinusitis with persistent complete opacification and mild expansion of the included portion of the sinus. Clear mastoid air cells. Bilateral cataract extraction. Other: 4 cm left postauricular lipoma. IMPRESSION: 1. No evidence of acute intracranial abnormality. 2. Moderate chronic small vessel ischemic disease. Electronically Signed   By: Dasie Hamburg M.D.   On: 04/25/2024 16:32    Vitals:   04/25/24 2356 04/26/24 0000 04/26/24 0349 04/26/24 0733  BP: (!) 145/68  (!) 167/59 (!) 160/61  Pulse: 79  77 73  Resp: 17   16  Temp: 99.3 F (37.4 C)  100 F (37.8 C) 99.2 F (37.3 C)  TempSrc: Oral  Oral Oral  SpO2: 100%  94% 96%  Weight:  76.1 kg    Height:  5'  7 (1.702 m)       PHYSICAL EXAM General:  Alert, well-nourished, well-developed patient in no acute distress Psych:  Mood and affect appropriate for situation CV: Regular rate and rhythm on monitor Respiratory:  Regular, unlabored respirations on room air GI: Abdomen soft and nontender   NEURO:  Mental Status: AA&Ox3, patient is able to give clear and coherent history Speech/Language: speech is without dysarthria or aphasia.  Naming, repetition, fluency, and comprehension intact.  Cranial Nerves:  II: PERRL. Visual fields with left hemianopia III, IV, VI: EOMI. Eyelids elevate symmetrically.  V: Sensation is intact to light touch and symmetrical to face.  VII: Face is symmetrical resting and smiling VIII: hearing intact to voice. IX, X: Palate elevates symmetrically. Phonation  is normal.  KP:Dynloizm shrug 5/5. XII: tongue is midline without fasciculations. Motor: 5/5 strength to all muscle groups tested.  Tone: is normal and bulk is normal Sensation- Intact to light touch bilaterally. Extinction absent to light touch to DSS.   Coordination: FTN intact bilaterally, HKS: no ataxia in BLE.No drift.  Gait- deferred  Most Recent NIH   1a Level of Conscious.:  1b LOC Questions:  1c LOC Commands:  2 Best Gaze:  3 Visual: 2 4 Facial Palsy:  5a Motor Arm - left:  5b Motor Arm - Right:  6a Motor Leg - Left:  6b Motor Leg - Right:  7 Limb Ataxia:  8 Sensory:  9 Best Language:  10 Dysarthria:  11 Extinct. and Inatten.:  TOTAL: 2   ASSESSMENT/PLAN  Mr. Absalom Aro is a 89 y.o. male with history of veteran of the [place]  [employer], with hx of ESRD on dialysis, CHF, carotid artery stenosis, GERDdiabetes, hypertension, hyperlipidemia, prior stroke with not much in terms of residual deficits, presents to the emergency department for evaluation of visual disturbance-which he reports his vision getting dimmed for the last 3 to 4 days. NIH on Admission 2  Stroke: Scattered right  PCA infarct, etiology: Likely large vessel disease source ?? in the setting with BP fluctuation with HD CT head No acute abnormality. Small vessel disease.  CTA head & neck severe stenosis proximal right P2, moderate stenosis right siphon and moderate to severe stenosis left siphon.   mild to moderate stenosis of bilateral cervical VAs, moderate left VA origin and mild to moderate left V4 stenosis. MRI  1. Acute infarct in the right PCA territory as above. 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 Additional remote lacunar infarcts in the left thalamus. Focus of signal abnormality and susceptibility within the left parietal lobe, possibly representing remote hemorrhage or vascular malformation such as cavernous malformation. Additional similar focus of signal abnormality and susceptibility along the right dorsal aspect of the pons. 2D Echo  EF of 55% with grade 1 diastolic dysfunction, left atrium is severely dilated LDL 53 HgbA1c 7.6 VTE prophylaxis - SCD clopidogrel  75 mg daily prior to admission, now on clopidogrel  75 mg daily and ASA 81 DAPT for 3 months and then plavix  alone Therapy recommendations:  HH PT Disposition:  pending   Hx of Stroke/TIA  Remote infarct in the right basal ganglia with evidence of prior hemorrhage. Additional remote lacunar infarcts in the left thalamus- On imaging  Per patient he had a stroke in 2004 but cannot remember symptoms  Hypertension Home meds: Amlodipine  10 mg, clonidine  0.3 mg, Cardura  2 mg, BiDil  20-37.5 mg Stable on the high end On Norvasc  10 and BiDil  Long-term BP goal normotensive  Hyperlipidemia Home meds: Atorvastatin  20 mg, resumed in hospital LDL 53, goal < 70 High intensity statin not indicated due to LDL below goal  Continue statin at discharge  Diabetes type II UnControlled Home meds: None HgbA1c 7.6, goal < 7.0 CBGs SSI Recommend close follow-up with PCP for better DM control  ESRD on  HD Nephro consulted  Recommend gentle fluid removal and avoid low BP  Other Stroke Risk Factors Advanced age Coronary artery disease  Other Active Problems GERD Chronic anemia  Hx of stomach ulcer with GIB on ASA, per patient, stomach ulcer has been cauterized.  Patient hemoglobin stable, discussed with patient he is in agreement with baby aspirin . 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  Patient will follow-up with ophthalmology  and neurology for driving clearance if improved in the future.  Hospital day # 1  Karna Geralds DNP, ACNPC-AG  [facility]   ATTENDING NOTE: I reviewed above note and agree with the assessment and plan. Pt was seen and examined.   Daughter are at bedside.  Patient lying in bed, awake alert, eyes open, orientated to place, time, initially told me age 73 but then on repetitive asking, changed to 35.  Otherwise neurologically intact except left hemianopia.  MRI showed right PCA scattered infarct.  CTA head and neck also showed multifocal intracranial stenosis including right P2 severe stenosis, stroke etiology likely large vessel disease in the setting of BP fluctuation with HD.  Recommend gentle fluid removal during HD session.  Avoid low BP.  Put on aspirin  81 on top of home Plavix  DAPT for 3 months and then Plavix  alone given high-grade stenosis of right P2.  Continue statin.  Aggressive risk factor modification.  For detailed assessment and plan, please refer to above as I have made changes wherever appropriate.   Neurology will sign off. Please call with questions. Pt will follow up with stroke clinic NP at [facility] in about 4 weeks. Thanks for the consult.   Ary Cummins, MD PhD Stroke Neurology 04/26/2024 6:28 PM    To contact Stroke Continuity provider, please refer to [email or web address]. After hours, contact General Neurology

## 2024-04-26 NOTE — H&P (Signed)
 History and Physical    Patient: Austin [name] FMW:[id number] DOB: 1934-10-26 DOA: 04/25/2024 DOS: the patient was seen and examined on 04/26/2024 PCP: Merna Huxley, NP  Patient coming from: Home  Chief Complaint:  Chief Complaint  Patient presents with   Loss of Vision   HPI: Austin [name] is a 89 y.o. male with medical history significant for end-stage renal disease on hemodialysis on Mondays Wednesdays and Fridays, hypertension, GI bleed in the distant past, carotid artery stenosis, and congestive heart failure presented with complaints of visual changes over the last 4 days.  The patient went to see an eye doctor who recommended that he come to the ED for MRI.  The patient has a left homonymous hemianopsia.  In the ED he did have a CT of his head as well as MRI of the brain.  That revealed an acute right PCA infarct and a remote right basal ganglia infarct with evidence of prior hemorrhage. The patient was seen by neurology and he will be admitted by the hospitalist service for further workup and management.  Review of Systems: As mentioned in the history of present illness. All other systems reviewed and are negative. 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 BLOOD LOSS 03/13/2007   CAROTID ARTERY STENOSIS 05/10/2010   CHF (congestive heart failure) (HCC)    DIABETES MELLITUS, TYPE II 09/19/2007   DISEASE, CEREBROVASCULAR NEC 03/05/2007   GERD 03/13/2007   HYPERLIPIDEMIA 03/05/2007   HYPERTENSION 03/05/2007   HYPOKALEMIA 11/09/2009   KNEE PAIN, RIGHT 11/09/2009   PROSTATE CANCER, HX OF 03/05/2007   RENAL DISEASE, CHRONIC 02/03/2009   Past Surgical History:  Procedure Laterality Date   A/V FISTULAGRAM N/A 03/21/2024   Procedure: A/V Fistulagram;  Surgeon: Magda Debby SAILOR, MD;  Location: [facility];  Service: Cardiovascular;   Laterality: N/A;   AV FISTULA PLACEMENT Left 10/18/2022   Procedure: INSERTION OF LEFT ARM BRACHIAL ARTERY TO AXILLARY VEIN ARTERIOVENOUS (AV) GORE-TEX GRAFT;  Surgeon: Lanis Fonda BRAVO, MD;  Location: [facility];  Service: Vascular;  Laterality: Left;   CAROTID ARTERY ANGIOPLASTY Right Oct. 10, 2001   ESOPHAGOGASTRODUODENOSCOPY (EGD) WITH PROPOFOL  N/A 11/04/2016   Procedure: ESOPHAGOGASTRODUODENOSCOPY (EGD) WITH PROPOFOL ;  Surgeon: Layla Lah, MD;  Location: [facility];  Service: Gastroenterology;  Laterality: N/A;   IR FLUORO GUIDE CV LINE RIGHT  10/25/2022   IR FLUORO GUIDE CV LINE RIGHT  10/27/2022   IR US  GUIDE VASC ACCESS RIGHT  10/25/2022   IR US  GUIDE VASC ACCESS RIGHT  10/27/2022   LAPAROSCOPIC APPENDECTOMY  02/20/2012   Procedure: APPENDECTOMY LAPAROSCOPIC;  Surgeon: Jina Nephew, MD;  Location: [facility];  Service: General;  Laterality: N/A;   PROSTATE SURGERY     prostatectomy   Social History:  reports that he quit smoking about 49 years ago. His smoking use included cigarettes. He started smoking about 59 years ago. He has a 10 pack-year smoking history. He has never used smokeless tobacco. He reports that he does not drink alcohol  and does not use drugs.  Allergies  Allergen Reactions   Aspirin  Other (See Comments)    High doses causes stomach ulcer and bleeding    Family History  Problem Relation Age of Onset   Hypertension Mother    Cancer Father        Mesothelioma    Stomach cancer Brother  Cancer Brother    Cancer - Cervical Brother    Cancer Brother    Diabetes Brother    Esophageal cancer Neg Hx    Colon cancer Neg Hx    Pancreatic cancer Neg Hx     Prior to Admission medications   Medication Sig Start Date End Date Taking? Authorizing Provider  acetaminophen  (TYLENOL ) 500 MG tablet Take 1,000 mg by mouth every 4 (four) hours as needed.   Yes [provider]  albuterol  (VENTOLIN  HFA) 108 (90 Base) MCG/ACT inhaler TAKE 2 PUFFS BY MOUTH EVERY 6 HOURS AS  NEEDED FOR WHEEZE OR SHORTNESS OF BREATH 09/13/22  Yes Byrum, Lamar RAMAN, MD  amLODipine  (NORVASC ) 10 MG tablet TAKE 1 TABLET BY MOUTH EVERY DAY 12/05/23  Yes Nafziger, Darleene, NP  atorvastatin  (LIPITOR) 20 MG tablet TAKE 1 TABLET BY MOUTH EVERY DAY 03/26/24  Yes Nafziger, Darleene, NP  cetirizine  (ZYRTEC ) 5 MG tablet Take 1 tablet (5 mg total) by mouth at bedtime. 03/15/24  Yes Rising, Asberry, PA-C  cholecalciferol  (VITAMIN D3) 25 MCG (1000 UNIT) tablet Take 1,000 Units by mouth daily.   Yes [provider]  cloNIDine  (CATAPRES ) 0.3 MG tablet Take 1 tablet (0.3 mg total) by mouth 3 (three) times daily. 09/20/20  Yes Segal, Jared E, MD  clopidogrel  (PLAVIX ) 75 MG tablet TAKE 1 TABLET BY MOUTH EVERY DAY 07/18/23  Yes Nafziger, Darleene, NP  doxazosin  (CARDURA ) 2 MG tablet Take 2 mg by mouth at bedtime.  12/28/17  Yes [provider]  esomeprazole  (NEXIUM ) 40 MG capsule TAKE 1 CAPSULE BY MOUTH EVERY DAY 03/07/24  Yes Nafziger, Darleene, NP  fluticasone  (FLONASE ) 50 MCG/ACT nasal spray Place 1 spray into both nostrils daily. 03/15/24  Yes Rising, Asberry, PA-C  isosorbide -hydrALAZINE  (BIDIL ) 20-37.5 MG tablet Take 1 tablet by mouth 3 (three) times daily.   Yes [provider]  ketoconazole  (NIZORAL ) 2 % cream Apply 1 Application topically 2 (two) times daily. 04/04/24  Yes McDonald, Juliene SAUNDERS, DPM  lactulose  (CHRONULAC ) 10 GM/15ML solution TAKE 30 ML BY MOUTH TWICE DAILY AS NEEDED 04/24/24  Yes Nafziger, Darleene, NP  Melatonin 10 MG TABS Take 10 mg by mouth at bedtime.   Yes [provider]  methylcellulose (CITRUCEL) oral powder 1 tablespoon every 2-3 days,alternating with lactulose    Yes [provider]  Multiple Vitamins-Minerals (PRESERVISION AREDS 2) CAPS Take 1 capsule by mouth daily. 01/29/24  Yes [provider]  Propylene Glycol (SYSTANE BALANCE OP) Place 1 drop into both eyes at bedtime.   Yes [provider]  sevelamer  carbonate (RENVELA ) 800 MG tablet Take 800 mg  by mouth 3 (three) times daily. 12/08/22  Yes [provider]  Accu-Chek Softclix Lancets lancets USED TO CHECK BLOOD GLUCOSE TWICE A DAY OR AS NEEDED 06/08/23   Nafziger, Darleene, NP  glucose blood (ACCU-CHEK GUIDE) test strip USE TO CHECK BLOOD GLUCOSE TWICE A DAY OR AS NEEDED 04/27/23   Nafziger, Darleene, NP  guaiFENesin  (MUCINEX ) 600 MG 12 hr tablet Take 600 mg by mouth at bedtime.    [provider]  Methoxy PEG-Epoetin  Beta (MIRCERA IJ) Mircera 10/30/23 10/28/24  [provider]    Physical Exam: Vitals:   04/25/24 2200 04/25/24 2245 04/25/24 2356 04/26/24 0000  BP: (!) 152/74 (!) 161/68 (!) 145/68   Pulse: 80 79 79   Resp: 15 13 17    Temp:   99.3 F (37.4 C)   TempSrc:   Oral   SpO2: 98% 97% 100%   Weight:  76.1 kg  Height:    5' 7 (1.702 m)   Physical Exam:  General: No acute distress, well developed, well nourished HEENT: Normocephalic, atraumatic Cardiovascular: Normal rate and rhythm. Distal pulses intact. Pulmonary: Normal pulmonary effort, normal breath sounds Gastrointestinal: Nondistended abdomen, soft, non-tender, normoactive bowel sounds Musculoskeletal:Normal ROM, no lower ext edema Skin: Skin is warm and dry. Neuro: Left homonymous hemianopsia, AAOx3. Nurse reports steady independent gait PSYCH: Attentive and cooperative  Data Reviewed:  Results for orders placed or performed during the hospital encounter of 04/25/24 (from the past 24 hours)  Ethanol     Status: None   Collection Time: 04/25/24  8:35 PM  Result Value Ref Range   Alcohol , Ethyl (B) <15 <15 mg/dL  Protime-INR     Status: None   Collection Time: 04/25/24  8:35 PM  Result Value Ref Range   Prothrombin Time 14.3 11.4 - 15.2 seconds   INR 1.0 0.8 - 1.2  APTT     Status: Abnormal   Collection Time: 04/25/24  8:35 PM  Result Value Ref Range   aPTT 37 (H) 24 - 36 seconds  CBC     Status: Abnormal   Collection Time: 04/25/24  8:35 PM  Result Value Ref Range   WBC 10.1 4.0  - 10.5 K/uL   RBC 4.16 (L) 4.22 - 5.81 MIL/uL   Hemoglobin 12.1 (L) 13.0 - 17.0 g/dL   HCT 62.1 (L) 60.9 - 47.9 %   MCV 90.9 80.0 - 100.0 fL   MCH 29.1 26.0 - 34.0 pg   MCHC 32.0 30.0 - 36.0 g/dL   RDW 83.3 (H) 88.4 - 84.4 %   Platelets 433 (H) 150 - 400 K/uL   nRBC 0.0 0.0 - 0.2 %  Differential     Status: None   Collection Time: 04/25/24  8:35 PM  Result Value Ref Range   Neutrophils Relative % 74 %   Neutro Abs 7.7 1.7 - 7.7 K/uL   Lymphocytes Relative 14 %   Lymphs Abs 1.4 0.7 - 4.0 K/uL   Monocytes Relative 8 %   Monocytes Absolute 0.8 0.1 - 1.0 K/uL   Eosinophils Relative 2 %   Eosinophils Absolute 0.2 0.0 - 0.5 K/uL   Basophils Relative 1 %   Basophils Absolute 0.1 0.0 - 0.1 K/uL   Immature Granulocytes 1 %   Abs Immature Granulocytes 0.06 0.00 - 0.07 K/uL  Comprehensive metabolic panel     Status: Abnormal   Collection Time: 04/25/24  8:35 PM  Result Value Ref Range   Sodium 139 135 - 145 mmol/L   Potassium 3.7 3.5 - 5.1 mmol/L   Chloride 95 (L) 98 - 111 mmol/L   CO2 24 22 - 32 mmol/L   Glucose, Bld 153 (H) 70 - 99 mg/dL   BUN 44 (H) 8 - 23 mg/dL   Creatinine, Ser 1.75 (H) 0.61 - 1.24 mg/dL   Calcium  9.8 8.9 - 10.3 mg/dL   Total Protein 8.5 (H) 6.5 - 8.1 g/dL   Albumin 3.8 3.5 - 5.0 g/dL   AST 13 (L) 15 - 41 U/L   ALT 8 0 - 44 U/L   Alkaline Phosphatase 93 38 - 126 U/L   Total Bilirubin 1.1 0.0 - 1.2 mg/dL   GFR, Estimated 6 (L) >60 mL/min   Anion gap 20 (H) 5 - 15  I-stat chem 8, ED     Status: Abnormal   Collection Time: 04/25/24  8:36 PM  Result Value Ref Range  Sodium 138 135 - 145 mmol/L   Potassium 3.7 3.5 - 5.1 mmol/L   Chloride 99 98 - 111 mmol/L   BUN 47 (H) 8 - 23 mg/dL   Creatinine, Ser 1.79 (H) 0.61 - 1.24 mg/dL   Glucose, Bld 846 (H) 70 - 99 mg/dL   Calcium , Ion 1.09 (L) 1.15 - 1.40 mmol/L   TCO2 27 22 - 32 mmol/L   Hemoglobin 13.9 13.0 - 17.0 g/dL   HCT 58.9 60.9 - 47.9 %     Assessment and Plan: Acute PCA CVA  Appreciate neurology  recommendations as follows: -Admit to hospitalist -Telemetry monitoring -No need for permissive HTN. BP goal normotension. Avoid hypotension. -CT Angiogram of Head and neck -Echocardiogram -HgbA1c, fasting lipid panel -Frequent neuro checks -Prophylactic therapy-Antiplatelet med: on plavix  at home. Continue that  for now. He was on ASA at one point and has GI bleed/ulcer and has been asked not to take it. -Atorvastatin  80 mg PO daily -Risk factor modification -PT consult, OT consult, Speech consult Plan discussed with Dr. Elnor Stroke team to follow  2. DMT2 -corrective dose insulin  ordered.  I do not see any medication for diabetes on his list.  3. ESRD -nephrology consulted to continue his hemodialysis on Mondays Wednesdays and Fridays.  4. Htn- The patient is on Cardura , clonidine , and Bidil .  Neurology note to avoid hypotension.  Will resume the BiDil  and Cardura  to start.  Then add the clonidine  later if his blood pressure can tolerate it.    Advance Care Planning:   Code Status: Prior the patient names his wife is a surrogate decision maker and wants to be full code.  Consults: Nephrology, neurology  Family Communication: Patient's wife was at bedside  Severity of Illness: The appropriate patient status for this patient is INPATIENT. Inpatient status is judged to be reasonable and necessary in order to provide the required intensity of service to ensure the patient's safety. The patient's presenting symptoms, physical exam findings, and initial radiographic and laboratory data in the context of their chronic comorbidities is felt to place them at high risk for further clinical deterioration. Furthermore, it is not anticipated that the patient will be medically stable for discharge from the hospital within 2 midnights of admission.   * I certify that at the point of admission it is my clinical judgment that the patient will require inpatient hospital care spanning beyond 2  midnights from the point of admission due to high intensity of service, high risk for further deterioration and high frequency of surveillance required.*  Author: ARTHEA CHILD, MD 04/26/2024 2:29 AM  For on call review [email or web address].

## 2024-04-26 NOTE — Progress Notes (Signed)
 PROGRESS NOTE  [name]  DOB: Aug 19, 1935  PCP: Merna Huxley, NP FMW:[id number]  DOA: 04/25/2024  LOS: 1 day  Hospital Day: 2  Brief narrative: Austin [name] is a 89 y.o. male with PMH significant for ESRD-HD-MWF, DM2, HTN, HLD, CHF, CKD, carotid artery stenosis, prior stroke, prostate cancer, h/o GI bleed, chronic anemia 9/4, patient was seen at an eye doctor's office with complaint of progressive vision impairment in both eyes for 4 days.  He was noted to have left homonymous hemianopsia and sent to the ED for stroke workup.  In the ED, patient was hemodynamically stable, breathing room air Initial labs with WC count 10, hemoglobin 12.1, CT head did not show any acute intracranial abnormality, showed moderate chronic small vessel ischemic disease. MRI brain showed an acute infarct in the right PCA territory, remote infarct in the right basal ganglia with evidence of prior hemorrhage, additional remote lacunar infarct in the left thalamus Seen by neurology Admitted to [facility] for stroke workup  Subjective: Patient was seen and examined this morning. Pleasant elderly African-American male.  Lying down in bed.  Not in distress.  Wife at bedside. Chart reviewed.  Low-grade temperature of 100 this morning Blood pressure this morning elevated to 160s  Assessment and plan: Acute right PCA territory stroke Presented with progressive bilateral vision impairment for 3 to 4 days. Imagings as above. Seen by neurology. Stroke workup initiated Pending CT angio head and neck Pending echocardiogram A1c 7.6, HDL 46, LDL 53 To be seen by PT/OT/ST PTA meds- Plavix , allergic to aspirin   ESRD-HD-MWF Nephrology consulted for maintenance dialysis  Essential hypertension PTA meds- Cardura  2 mg nightly,clonidine  0.3 mg 3 times daily, isosorbide  dinitrate 1 20 mg 3 times daily, hydralazine  37.5 mg 3 times daily, amlodipine  10 mg daily Currently resumed on amlodipine , Cardura  and BiDil .  Clonidine   remains on hold. Permissive hypertension allowed for first 48 to 72 hours  Type 2 diabetes mellitus A1c 7.6 in July 2025 PTA meds-none Currently on SSI/Accu-Cheks Recent Labs  Lab 04/26/24 0621 04/26/24 1153  GLUCAP 146* 148*   HLD Lipitor   Mobility:  PT Orders:   PT Follow up Rec:    Goals of care   Code Status: Prior     DVT prophylaxis:  Place and maintain sequential compression device Start: 04/26/24 1158   Antimicrobials: None Fluid: None Consultants: Neurology Family Communication: Wife at bedside  Status: Inpatient Level of care:  Telemetry Medical   Patient is from: Home Needs to continue in-hospital care: Ongoing stroke workup Anticipated d/c to: Hopefully home in 1 to 2 days    Diet:  Diet Order             Diet NPO time specified  Diet effective now                   Scheduled Meds:   stroke: early stages of recovery book   Does not apply Once   [START ON 04/27/2024]  stroke: early stages of recovery book   Does not apply Once   amLODipine   10 mg Oral Daily   artificial tears  1 drop Both Eyes QHS   atorvastatin   20 mg Oral Daily   clopidogrel   75 mg Oral Daily   doxazosin   2 mg Oral QHS   [START ON 04/27/2024] Influenza vac split trivalent PF  0.5 mL Intramuscular Tomorrow-1000   insulin  aspart  0-6 Units Subcutaneous TID WC   isosorbide -hydrALAZINE   1 tablet Oral TID   pantoprazole   40 mg Oral Daily   sevelamer  carbonate  800 mg Oral TID with meals    PRN meds: lactulose    Infusions:    Antimicrobials: Anti-infectives (From admission, onward)    None       Objective: Vitals:   04/26/24 0349 04/26/24 0733  BP: (!) 167/59 (!) 160/61  Pulse: 77 73  Resp:  16  Temp: 100 F (37.8 C) 99.2 F (37.3 C)  SpO2: 94% 96%   No intake or output data in the 24 hours ending 04/26/24 1159 Filed Weights   04/26/24 0000  Weight: 76.1 kg   Weight change:  Body mass index is 26.28 kg/m.   Physical Exam: General exam:  Pleasant, elderly African-American male.  Not in distress Skin: No rashes, lesions or ulcers. HEENT: Atraumatic, normocephalic, no obvious bleeding Lungs: Clear to auscultation bilaterally,  CVS: S1, S2, no murmur,   GI/Abd: Soft, nontender, nondistended, bowel sound present,   CNS: Alert, awake, able to answer orientation questions Psychiatry: Mood appropriate Extremities: No pedal edema, no calf tenderness,   Data Review: I have personally reviewed the laboratory data and studies available.  F/u labs  Unresulted Labs (From admission, onward)     Start     Ordered   04/25/24 1551  Urine rapid drug screen (hosp performed)  Once,   STAT       Question:  Indication  Answer:  Altered mental status, unspecified R41.82   04/25/24 1551           Signed, Chapman Rota, MD [facility] 04/26/2024

## 2024-04-26 NOTE — Evaluation (Signed)
 Physical Therapy Evaluation Patient Details Name: Austin [name] MRN: [id number] DOB: 02/12/1935 Today's Date: 04/26/2024  History of Present Illness  Pt is 89 year old presented to [facility] on 10/25/22 with acute hypoxic respiratory failure due to acute Covid. Pt with continued progression of ckd and temporary HD cath placed and HD intitiated. PMH: ckd with impending HD, diastolic CHF, DM2 HTN, HLD, prostate CA hx  Clinical Impression  Pt is presenting slightly below baseline level of functioning. Currently pt is supervision/CGA for sit to stand and CGA to supervision for gait with SPC. No overt LOB. Pt with very slow gait requiring cues to scan environment to avoid hitting into objects on the L. Due to pt current functional status, home set up and available assistance at home recommending skilled physical therapy services 3x/week in order to address strength, balance and functional mobility to decrease risk for falls, injury and re-hospitalization.           If plan is discharge home, recommend the following: A little help with walking and/or transfers;Help with stairs or ramp for entrance;Assist for transportation;Assistance with cooking/housework;Supervision due to cognitive status     Equipment Recommendations None recommended by PT     Functional Status Assessment Patient has had a recent decline in their functional status and demonstrates the ability to make significant improvements in function in a reasonable and predictable amount of time.     Precautions / Restrictions Precautions Precautions: Fall Recall of Precautions/Restrictions: Intact Restrictions Weight Bearing Restrictions Per Provider Order: No      Mobility  Bed Mobility   General bed mobility comments: Pt sitting EOB at beginning and end of session    Transfers Overall transfer level: Needs assistance Equipment used: Straight cane Transfers: Sit to/from Stand Sit to Stand: Supervision, Contact guard assist            General transfer comment: for safety, poor awareness of L side no overt LOB. Standing in crouched position for prolonged period of time with increased time to get fully to standing EOB for 2x sit to stand with supervision to CGA for safety    Ambulation/Gait Ambulation/Gait assistance: Contact guard assist Gait Distance (Feet): 200 Feet Assistive device: Straight cane Gait Pattern/deviations: Step-through pattern, Drifts right/left Gait velocity: severely decreased <0.16 ft/sec Gait velocity interpretation: <1.31 ft/sec, indicative of household ambulator   General Gait Details: partial step through shuffling gait pattern; pt unable to correct when cued. Requires moderate cues to scan environment in order to avoid hitting objects on the L, Pt tends to drift L     Balance Overall balance assessment: Mild deficits observed, not formally tested, Needs assistance Sitting-balance support: Bilateral upper extremity supported, Feet supported Sitting balance-Leahy Scale: Normal     Standing balance support: Reliant on assistive device for balance, Single extremity supported, During functional activity Standing balance-Leahy Scale: Fair Standing balance comment: no overt LOB but reqlies on AD For balance         Pertinent Vitals/Pain Pain Assessment Pain Assessment: No/denies pain    Home Living Family/patient expects to be discharged to:: Private residence Living Arrangements: Spouse/significant other;Other relatives (grand daughter and 77 yo great grandson) Available Help at Discharge: Family;Available 24 hours/day Type of Home: House Home Access: Stairs to enter Entrance Stairs-Rails: Left Entrance Stairs-Number of Steps: 5-6 Alternate Level Stairs-Number of Steps: flight Home Layout: Two level;1/2 bath on main level;Able to live on main level with bedroom/bathroom Home Equipment: Rollator (4 wheels);Cane - single Librarian, academic (2 wheels);BSC/3in1;Other (comment) (oxygen )  Prior Function Prior Level of Function : Independent/Modified Independent             Mobility Comments: intermittently uses cane: denies falls. Uses scooters at grocery store. Normally walks slower and takes time to stand. ADLs Comments: Mod I     Extremity/Trunk Assessment   Upper Extremity Assessment Upper Extremity Assessment: Defer to OT evaluation    Lower Extremity Assessment Lower Extremity Assessment: Generalized weakness    Cervical / Trunk Assessment Cervical / Trunk Assessment: Normal  Communication   Communication Communication: No apparent difficulties    Cognition Arousal: Alert Behavior During Therapy: WFL for tasks assessed/performed   PT - Cognitive impairments: Safety/Judgement, Awareness     PT - Cognition Comments: pt is A and O x4 pt fidgeting with front of gown, poor awareness of deficits Following commands: Intact       Cueing Cueing Techniques: Verbal cues, Tactile cues, Visual cues     General Comments General comments (skin integrity, edema, etc.): spouse present in room. HR stable throughout session around 95-100 bpm during gait.        Assessment/Plan    PT Assessment Patient needs continued PT services  PT Problem List Decreased strength;Decreased activity tolerance;Decreased balance;Decreased mobility       PT Treatment Interventions DME instruction;Balance training;Gait training;Stair training;Neuromuscular re-education;Functional mobility training;Patient/family education;Therapeutic activities;Therapeutic exercise    PT Goals (Current goals can be found in the Care Plan section)  Acute Rehab PT Goals Patient Stated Goal: to return home PT Goal Formulation: With patient Time For Goal Achievement: 05/10/24 Potential to Achieve Goals: Good    Frequency Min 2X/week        AM-PAC PT 6 Clicks Mobility  Outcome Measure Help needed turning from your back to your side while in a flat bed without using bedrails?:  None Help needed moving from lying on your back to sitting on the side of a flat bed without using bedrails?: None Help needed moving to and from a bed to a chair (including a wheelchair)?: A Little Help needed standing up from a chair using your arms (e.g., wheelchair or bedside chair)?: A Little Help needed to walk in hospital room?: A Little Help needed climbing 3-5 steps with a railing? : A Little 6 Click Score: 20    End of Session Equipment Utilized During Treatment: Gait belt Activity Tolerance: Patient tolerated treatment well Patient left: in bed;with call bell/phone within reach;with bed alarm set;with family/visitor present Nurse Communication: Mobility status PT Visit Diagnosis: Unsteadiness on feet (R26.81);Other abnormalities of gait and mobility (R26.89);Muscle weakness (generalized) (M62.81)    Time: 8453-8382 PT Time Calculation (min) (ACUTE ONLY): 31 min   Charges:   PT Evaluation $PT Eval Low Complexity: 1 Low PT Treatments $Therapeutic Activity: 8-22 mins PT General Charges $$ ACUTE PT VISIT: 1 Visit        Dorothyann Maier, DPT, CLT  Acute Rehabilitation Services Office: [phone] (Secure chat preferred)   Dorothyann VEAR Maier 04/26/2024, 4:31 PM

## 2024-04-26 NOTE — Plan of Care (Signed)

## 2024-04-26 NOTE — Consult Note (Signed)
 Renal Service Consult Note [facility] Lamar JONETTA Fret, MD  Patient: Austin [name] Date: 04/26/2024 Requesting Physician: Dr. Arlice  Reason for Consult: ESRD patient with acute stroke HPI: The patient is a 89 y.o. year-old w/ PMH as below who presented to ED yesterday complaining of decreasing vision in both eyes which started a few days ago.  Sent from an eye doctor.  Blood pressure was 160/60, HR 72, RR 18, temp 99.3, 100% on room air.  K+ 3.7, creatinine 8.2, BUN 47, WBC 10K, Hgb 12.  Imaging showed a acute right PCA infarct.  Patient was admitted to the medical service.  We are asked to see for dialysis.   Pt seen in room.  Patient is in good spirits.  No complaints at this time, sitting on the side of the bed.   ROS - denies CP, no joint pain, no HA, no blurry vision, no rash, no diarrhea, no nausea/ vomiting   Past Medical History  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 BLOOD LOSS 03/13/2007   CAROTID ARTERY STENOSIS 05/10/2010   CHF (congestive heart failure) (HCC)    DIABETES MELLITUS, TYPE II 09/19/2007   DISEASE, CEREBROVASCULAR NEC 03/05/2007   GERD 03/13/2007   HYPERLIPIDEMIA 03/05/2007   HYPERTENSION 03/05/2007   HYPOKALEMIA 11/09/2009   KNEE PAIN, RIGHT 11/09/2009   PROSTATE CANCER, HX OF 03/05/2007   RENAL DISEASE, CHRONIC 02/03/2009   Past Surgical History  Past Surgical History:  Procedure Laterality Date   A/V FISTULAGRAM N/A 03/21/2024   Procedure: A/V Fistulagram;  Surgeon: Magda Debby SAILOR, MD;  Location: [facility];  Service: Cardiovascular;  Laterality: N/A;   AV FISTULA PLACEMENT Left 10/18/2022   Procedure: INSERTION OF LEFT ARM BRACHIAL ARTERY TO AXILLARY VEIN ARTERIOVENOUS (AV) GORE-TEX GRAFT;  Surgeon: Lanis Fonda BRAVO, MD;  Location: [facility] OR;  Service: Vascular;  Laterality: Left;    CAROTID ARTERY ANGIOPLASTY Right Oct. 10, 2001   ESOPHAGOGASTRODUODENOSCOPY (EGD) WITH PROPOFOL  N/A 11/04/2016   Procedure: ESOPHAGOGASTRODUODENOSCOPY (EGD) WITH PROPOFOL ;  Surgeon: Layla Lah, MD;  Location: [facility];  Service: Gastroenterology;  Laterality: N/A;   IR FLUORO GUIDE CV LINE RIGHT  10/25/2022   IR FLUORO GUIDE CV LINE RIGHT  10/27/2022   IR US  GUIDE VASC ACCESS RIGHT  10/25/2022   IR US  GUIDE VASC ACCESS RIGHT  10/27/2022   LAPAROSCOPIC APPENDECTOMY  02/20/2012   Procedure: APPENDECTOMY LAPAROSCOPIC;  Surgeon: Jina Nephew, MD;  Location: [facility] OR;  Service: General;  Laterality: N/A;   PROSTATE SURGERY     prostatectomy   Family History  Family History  Problem Relation Age of Onset   Hypertension Mother    Cancer Father        Mesothelioma    Stomach cancer Brother    Cancer Brother    Cancer - Cervical Brother    Cancer Brother    Diabetes Brother    Esophageal cancer Neg Hx    Colon cancer Neg Hx    Pancreatic cancer Neg Hx    Social History  reports that he quit smoking about 49 years ago. His smoking use included cigarettes. He started smoking about 59 years ago. He has a 10 pack-year smoking history. He has never used smokeless tobacco. He reports that he does not drink alcohol  and does not use drugs. Allergies  Allergies  Allergen Reactions  Aspirin  Other (See Comments)    High doses causes stomach ulcer and bleeding   Home medications Prior to Admission medications   Medication Sig Start Date End Date Taking? Authorizing Provider  acetaminophen  (TYLENOL ) 500 MG tablet Take 1,000 mg by mouth every 4 (four) hours as needed.   Yes [provider]  albuterol  (VENTOLIN  HFA) 108 (90 Base) MCG/ACT inhaler TAKE 2 PUFFS BY MOUTH EVERY 6 HOURS AS NEEDED FOR WHEEZE OR SHORTNESS OF BREATH 09/13/22  Yes Shelah Lamar RAMAN, MD  amLODipine  (NORVASC ) 10 MG tablet TAKE 1 TABLET BY MOUTH EVERY DAY 12/05/23  Yes Nafziger, Darleene, NP  atorvastatin  (LIPITOR) 20 MG tablet  TAKE 1 TABLET BY MOUTH EVERY DAY 03/26/24  Yes Nafziger, Darleene, NP  cetirizine  (ZYRTEC ) 5 MG tablet Take 1 tablet (5 mg total) by mouth at bedtime. 03/15/24  Yes Rising, Asberry, PA-C  cholecalciferol  (VITAMIN D3) 25 MCG (1000 UNIT) tablet Take 1,000 Units by mouth daily.   Yes [provider]  cloNIDine  (CATAPRES ) 0.3 MG tablet Take 1 tablet (0.3 mg total) by mouth 3 (three) times daily. 09/20/20  Yes Segal, Jared E, MD  clopidogrel  (PLAVIX ) 75 MG tablet TAKE 1 TABLET BY MOUTH EVERY DAY 07/18/23  Yes Nafziger, Darleene, NP  doxazosin  (CARDURA ) 2 MG tablet Take 2 mg by mouth at bedtime.  12/28/17  Yes [provider]  esomeprazole  (NEXIUM ) 40 MG capsule TAKE 1 CAPSULE BY MOUTH EVERY DAY 03/07/24  Yes Nafziger, Darleene, NP  fluticasone  (FLONASE ) 50 MCG/ACT nasal spray Place 1 spray into both nostrils daily. 03/15/24  Yes Rising, Asberry, PA-C  isosorbide -hydrALAZINE  (BIDIL ) 20-37.5 MG tablet Take 1 tablet by mouth 3 (three) times daily.   Yes [provider]  ketoconazole  (NIZORAL ) 2 % cream Apply 1 Application topically 2 (two) times daily. 04/04/24  Yes McDonald, Juliene SAUNDERS, DPM  lactulose  (CHRONULAC ) 10 GM/15ML solution TAKE 30 ML BY MOUTH TWICE DAILY AS NEEDED 04/24/24  Yes Nafziger, Darleene, NP  Melatonin 10 MG TABS Take 10 mg by mouth at bedtime.   Yes [provider]  methylcellulose (CITRUCEL) oral powder 1 tablespoon every 2-3 days,alternating with lactulose    Yes [provider]  Multiple Vitamins-Minerals (PRESERVISION AREDS 2) CAPS Take 1 capsule by mouth daily. 01/29/24  Yes [provider]  Propylene Glycol (SYSTANE BALANCE OP) Place 1 drop into both eyes at bedtime.   Yes [provider]  sevelamer  carbonate (RENVELA ) 800 MG tablet Take 800 mg by mouth 3 (three) times daily. 12/08/22  Yes [provider]  Accu-Chek Softclix Lancets lancets USED TO CHECK BLOOD GLUCOSE TWICE A DAY OR AS NEEDED 06/08/23   Nafziger, Darleene, NP  glucose blood  (ACCU-CHEK GUIDE) test strip USE TO CHECK BLOOD GLUCOSE TWICE A DAY OR AS NEEDED 04/27/23   Nafziger, Darleene, NP  guaiFENesin  (MUCINEX ) 600 MG 12 hr tablet Take 600 mg by mouth at bedtime.    [provider]  Methoxy PEG-Epoetin  Beta (MIRCERA IJ) Mircera 10/30/23 10/28/24  [provider]     Vitals:   04/25/24 2356 04/26/24 0000 04/26/24 0349 04/26/24 0733  BP: (!) 145/68  (!) 167/59 (!) 160/61  Pulse: 79  77 73  Resp: 17   16  Temp: 99.3 F (37.4 C)  100 F (37.8 C) 99.2 F (37.3 C)  TempSrc: Oral  Oral Oral  SpO2: 100%  94% 96%  Weight:  76.1 kg    Height:  5' 7 (1.702 m)     Exam Gen alert, no distress  Sclera anicteric, throat clear  No jvd or bruits Chest clear bilat to bases RRR no MRG Abd soft ntnd no mass or ascites +bs Ext trace LE edema, no other edema Neuro is alert, Ox 3 , nf    AVG+bruit   Home bp meds: Norvasc  10 every day Clonidine  0.3 tid Cardura  2 mg at bedtime Bidil  20-37.5 tid   OP HD: MWF NW 4h  B400  77.4kg  2K bath   AVG   Heparin  1500 Last OP HD 9/10 post 77kg Mircera 75 mcg q 2 wks, last 8/18   Assessment/ Plan: Acute CVA: per neuro/ pmd.  ESRD: on HD MWF. No missed HD. Pt prefers HD tomorrow vs in the middle of the night. Plan HD 1st shift am.  HTN: bp's stable 140/75 range, home meds prn per pmd.  Volume: euvolemic on exam, up 1.5 kg, UF 2 L next HD.  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   Myer Fret  MD [facility] 04/26/2024, 11:20 AM  Recent Labs  Lab 04/25/24 2035 04/25/24 2036  HGB 12.1* 13.9  ALBUMIN 3.8  --   CALCIUM  9.8  --   CREATININE 8.24* 8.20*  K 3.7 3.7   Inpatient medications:   stroke: early stages of recovery book   Does not apply Once   amLODipine   10 mg Oral Daily   artificial tears  1 drop Both Eyes QHS   atorvastatin   20 mg Oral Daily   clopidogrel   75 mg Oral Daily   doxazosin   2 mg Oral QHS   [START ON 04/27/2024] Influenza vac split trivalent PF  0.5 mL Intramuscular Tomorrow-1000   insulin   aspart  0-6 Units Subcutaneous TID WC   isosorbide -hydrALAZINE   1 tablet Oral TID   pantoprazole   40 mg Oral Daily   sevelamer  carbonate  800 mg Oral TID with meals    lactulose 

## 2024-04-26 NOTE — Plan of Care (Signed)
 Problem: Education: Goal: Knowledge of General Education information will improve Description: Including pain rating scale, medication(s)/side effects and non-pharmacologic comfort measures 04/26/2024 1720 by Kermit Erby NOVAK, RN Outcome: Progressing 04/26/2024 1720 by Kermit Erby NOVAK, RN Outcome: Progressing   Problem: Health Behavior/Discharge Planning: Goal: Ability to manage health-related needs will improve 04/26/2024 1720 by Kermit Erby NOVAK, RN Outcome: Progressing 04/26/2024 1720 by Kermit Erby NOVAK, RN Outcome: Progressing   Problem: Clinical Measurements: Goal: Ability to maintain clinical measurements within normal limits will improve 04/26/2024 1720 by Kermit Erby NOVAK, RN Outcome: Progressing 04/26/2024 1720 by Kermit Erby NOVAK, RN Outcome: Progressing Goal: Will remain free from infection 04/26/2024 1720 by Kermit Erby NOVAK, RN Outcome: Progressing 04/26/2024 1720 by Kermit Erby NOVAK, RN Outcome: Progressing Goal: Diagnostic test results will improve 04/26/2024 1720 by Kermit Erby NOVAK, RN Outcome: Progressing 04/26/2024 1720 by Kermit Erby NOVAK, RN Outcome: Progressing Goal: Respiratory complications will improve 04/26/2024 1720 by Kermit Erby NOVAK, RN Outcome: Progressing 04/26/2024 1720 by Kermit Erby NOVAK, RN Outcome: Progressing Goal: Cardiovascular complication will be avoided 04/26/2024 1720 by Kermit Erby NOVAK, RN Outcome: Progressing 04/26/2024 1720 by Kermit Erby NOVAK, RN Outcome: Progressing   Problem: Activity: Goal: Risk for activity intolerance will decrease 04/26/2024 1720 by Kermit Erby NOVAK, RN Outcome: Progressing 04/26/2024 1720 by Kermit Erby NOVAK, RN Outcome: Progressing   Problem: Nutrition: Goal: Adequate nutrition will be maintained 04/26/2024 1720 by Kermit Erby NOVAK, RN Outcome: Progressing 04/26/2024 1720 by Kermit Erby NOVAK, RN Outcome: Progressing   Problem: Coping: Goal: Level of anxiety will  decrease 04/26/2024 1720 by Kermit Erby NOVAK, RN Outcome: Progressing 04/26/2024 1720 by Kermit Erby NOVAK, RN Outcome: Progressing   Problem: Elimination: Goal: Will not experience complications related to bowel motility 04/26/2024 1720 by Kermit Erby NOVAK, RN Outcome: Progressing 04/26/2024 1720 by Kermit Erby NOVAK, RN Outcome: Progressing Goal: Will not experience complications related to urinary retention 04/26/2024 1720 by Kermit Erby NOVAK, RN Outcome: Progressing 04/26/2024 1720 by Kermit Erby NOVAK, RN Outcome: Progressing   Problem: Pain Managment: Goal: General experience of comfort will improve and/or be controlled 04/26/2024 1720 by Kermit Erby NOVAK, RN Outcome: Progressing 04/26/2024 1720 by Kermit Erby NOVAK, RN Outcome: Progressing   Problem: Safety: Goal: Ability to remain free from injury will improve 04/26/2024 1720 by Kermit Erby NOVAK, RN Outcome: Progressing 04/26/2024 1720 by Kermit Erby NOVAK, RN Outcome: Progressing   Problem: Education: Goal: Ability to describe self-care measures that may prevent or decrease complications (Diabetes Survival Skills Education) will improve 04/26/2024 1720 by Kermit Erby NOVAK, RN Outcome: Progressing 04/26/2024 1720 by Kermit Erby NOVAK, RN Outcome: Progressing Goal: Individualized Educational Video(s) 04/26/2024 1720 by Kermit Erby NOVAK, RN Outcome: Progressing 04/26/2024 1720 by Kermit Erby NOVAK, RN Outcome: Progressing   Problem: Fluid Volume: Goal: Ability to maintain a balanced intake and output will improve 04/26/2024 1720 by Kermit Erby NOVAK, RN Outcome: Progressing 04/26/2024 1720 by Kermit Erby NOVAK, RN Outcome: Progressing   Problem: Coping: Goal: Ability to adjust to condition or change in health will improve 04/26/2024 1720 by Kermit Erby NOVAK, RN Outcome: Progressing 04/26/2024 1720 by Kermit Erby NOVAK, RN Outcome: Progressing   Problem: Education: Goal: Ability to describe  self-care measures that may prevent or decrease complications (Diabetes Survival Skills Education) will improve 04/26/2024 1720 by Kermit Erby NOVAK, RN Outcome: Progressing 04/26/2024 1720 by Kermit Erby NOVAK, RN Outcome: Progressing Goal: Individualized Educational Video(s) 04/26/2024 1720 by Kermit Erby NOVAK, RN Outcome: Progressing 04/26/2024 1720 by Kermit Erby NOVAK, RN Outcome:  Progressing   Problem: Fluid Volume: Goal: Ability to maintain a balanced intake and output will improve 04/26/2024 1720 by Kermit Erby NOVAK, RN Outcome: Progressing 04/26/2024 1720 by Kermit Erby NOVAK, RN Outcome: Progressing   Problem: Health Behavior/Discharge Planning: Goal: Ability to identify and utilize available resources and services will improve 04/26/2024 1720 by Kermit Erby NOVAK, RN Outcome: Progressing 04/26/2024 1720 by Kermit Erby NOVAK, RN Outcome: Progressing Goal: Ability to manage health-related needs will improve 04/26/2024 1720 by Kermit Erby NOVAK, RN Outcome: Progressing 04/26/2024 1720 by Kermit Erby NOVAK, RN Outcome: Progressing   Problem: Metabolic: Goal: Ability to maintain appropriate glucose levels will improve 04/26/2024 1720 by Kermit Erby NOVAK, RN Outcome: Progressing 04/26/2024 1720 by Kermit Erby NOVAK, RN Outcome: Progressing   Problem: Nutritional: Goal: Maintenance of adequate nutrition will improve 04/26/2024 1720 by Kermit Erby NOVAK, RN Outcome: Progressing 04/26/2024 1720 by Kermit Erby NOVAK, RN Outcome: Progressing Goal: Progress toward achieving an optimal weight will improve 04/26/2024 1720 by Kermit Erby NOVAK, RN Outcome: Progressing 04/26/2024 1720 by Kermit Erby NOVAK, RN Outcome: Progressing   Problem: Skin Integrity: Goal: Risk for impaired skin integrity will decrease 04/26/2024 1720 by Kermit Erby NOVAK, RN Outcome: Progressing 04/26/2024 1720 by Kermit Erby NOVAK, RN Outcome: Progressing   Problem: Tissue  Perfusion: Goal: Adequacy of tissue perfusion will improve 04/26/2024 1720 by Kermit Erby NOVAK, RN Outcome: Progressing 04/26/2024 1720 by Kermit Erby NOVAK, RN Outcome: Progressing   Problem: Education: Goal: Knowledge of disease or condition will improve 04/26/2024 1720 by Kermit Erby NOVAK, RN Outcome: Progressing 04/26/2024 1720 by Kermit Erby NOVAK, RN Outcome: Progressing Goal: Knowledge of secondary prevention will improve (MUST DOCUMENT ALL) 04/26/2024 1720 by Kermit Erby NOVAK, RN Outcome: Progressing 04/26/2024 1720 by Kermit Erby NOVAK, RN Outcome: Progressing Goal: Knowledge of patient specific risk factors will improve (DELETE if not current risk factor) 04/26/2024 1720 by Kermit Erby NOVAK, RN Outcome: Progressing 04/26/2024 1720 by Kermit Erby NOVAK, RN Outcome: Progressing   Problem: Ischemic Stroke/TIA Tissue Perfusion: Goal: Complications of ischemic stroke/TIA will be minimized 04/26/2024 1720 by Kermit Erby NOVAK, RN Outcome: Progressing 04/26/2024 1720 by Kermit Erby NOVAK, RN Outcome: Progressing   Problem: Coping: Goal: Will verbalize positive feelings about self 04/26/2024 1720 by Kermit Erby NOVAK, RN Outcome: Progressing 04/26/2024 1720 by Kermit Erby NOVAK, RN Outcome: Progressing Goal: Will identify appropriate support needs 04/26/2024 1720 by Kermit Erby NOVAK, RN Outcome: Progressing 04/26/2024 1720 by Kermit Erby NOVAK, RN Outcome: Progressing   Problem: Health Behavior/Discharge Planning: Goal: Ability to manage health-related needs will improve 04/26/2024 1720 by Kermit Erby NOVAK, RN Outcome: Progressing 04/26/2024 1720 by Kermit Erby NOVAK, RN Outcome: Progressing Goal: Goals will be collaboratively established with patient/family 04/26/2024 1720 by Kermit Erby NOVAK, RN Outcome: Progressing 04/26/2024 1720 by Kermit Erby NOVAK, RN Outcome: Progressing   Problem: Self-Care: Goal: Ability to participate in self-care as  condition permits will improve 04/26/2024 1720 by Kermit Erby NOVAK, RN Outcome: Progressing 04/26/2024 1720 by Kermit Erby NOVAK, RN Outcome: Progressing Goal: Verbalization of feelings and concerns over difficulty with self-care will improve 04/26/2024 1720 by Kermit Erby NOVAK, RN Outcome: Progressing 04/26/2024 1720 by Kermit Erby NOVAK, RN Outcome: Progressing Goal: Ability to communicate needs accurately will improve 04/26/2024 1720 by Kermit Erby NOVAK, RN Outcome: Progressing 04/26/2024 1720 by Kermit Erby NOVAK, RN Outcome: Progressing   Problem: Nutrition: Goal: Risk of aspiration will decrease 04/26/2024 1720 by Kermit Erby NOVAK, RN Outcome: Progressing 04/26/2024 1720 by Kermit Erby NOVAK, RN Outcome: Progressing Goal:  Dietary intake will improve 04/26/2024 1720 by Kermit Erby NOVAK, RN Outcome: Progressing 04/26/2024 1720 by Kermit Erby NOVAK, RN Outcome: Progressing   Problem: Education: Goal: Knowledge of disease or condition will improve Outcome: Progressing Goal: Knowledge of secondary prevention will improve (MUST DOCUMENT ALL) Outcome: Progressing Goal: Knowledge of patient specific risk factors will improve (DELETE if not current risk factor) Outcome: Progressing   Problem: Ischemic Stroke/TIA Tissue Perfusion: Goal: Complications of ischemic stroke/TIA will be minimized Outcome: Progressing   Problem: Coping: Goal: Will verbalize positive feelings about self Outcome: Progressing Goal: Will identify appropriate support needs Outcome: Progressing   Problem: Health Behavior/Discharge Planning: Goal: Ability to manage health-related needs will improve Outcome: Progressing Goal: Goals will be collaboratively established with patient/family Outcome: Progressing

## 2024-04-26 NOTE — Progress Notes (Signed)
   04/25/24 2356  Vitals  Temp 99.3 F (37.4 C)  Temp Source Oral  BP (!) 145/68  MAP (mmHg) 91  BP Location Right Arm  BP Method Automatic  Patient Position (if appropriate) Lying  Pulse Rate 79  Pulse Rate Source Dinamap  Resp 17  MEWS COLOR  MEWS Score Color Green  Oxygen  Therapy  SpO2 100 %  O2 Device Room Air  MEWS Score  MEWS Temp 0  MEWS Systolic 0  MEWS Pulse 0  MEWS RR 0  MEWS LOC 0  MEWS Score 0   New admit to room 3w01, accompanied by wife. Alert and oriented, denies pain or dizziness. Able to complete admission info and assessment. Oriented to room, unit, call light system and fall protocol.

## 2024-04-27 ENCOUNTER — Other Ambulatory Visit (HOSPITAL_COMMUNITY): Payer: Self-pay

## 2024-04-27 ENCOUNTER — Encounter (HOSPITAL_COMMUNITY): Payer: Self-pay

## 2024-04-27 ENCOUNTER — Encounter (HOSPITAL_COMMUNITY): Payer: Self-pay | Admitting: Internal Medicine

## 2024-04-27 DIAGNOSIS — I4891 Unspecified atrial fibrillation: Secondary | ICD-10-CM | POA: Insufficient documentation

## 2024-04-27 DIAGNOSIS — I132 Hypertensive heart and chronic kidney disease with heart failure and with stage 5 chronic kidney disease, or end stage renal disease: Secondary | ICD-10-CM | POA: Diagnosis not present

## 2024-04-27 DIAGNOSIS — I5031 Acute diastolic (congestive) heart failure: Secondary | ICD-10-CM | POA: Diagnosis not present

## 2024-04-27 DIAGNOSIS — I63531 Cerebral infarction due to unspecified occlusion or stenosis of right posterior cerebral artery: Secondary | ICD-10-CM | POA: Diagnosis not present

## 2024-04-27 DIAGNOSIS — Z992 Dependence on renal dialysis: Secondary | ICD-10-CM | POA: Diagnosis not present

## 2024-04-27 DIAGNOSIS — I120 Hypertensive chronic kidney disease with stage 5 chronic kidney disease or end stage renal disease: Secondary | ICD-10-CM | POA: Diagnosis not present

## 2024-04-27 DIAGNOSIS — E1122 Type 2 diabetes mellitus with diabetic chronic kidney disease: Secondary | ICD-10-CM | POA: Diagnosis not present

## 2024-04-27 DIAGNOSIS — N186 End stage renal disease: Secondary | ICD-10-CM | POA: Diagnosis not present

## 2024-04-27 DIAGNOSIS — H547 Unspecified visual loss: Secondary | ICD-10-CM | POA: Diagnosis not present

## 2024-04-27 DIAGNOSIS — Z23 Encounter for immunization: Secondary | ICD-10-CM | POA: Diagnosis not present

## 2024-04-27 DIAGNOSIS — Z8616 Personal history of COVID-19: Secondary | ICD-10-CM | POA: Diagnosis not present

## 2024-04-27 DIAGNOSIS — I639 Cerebral infarction, unspecified: Secondary | ICD-10-CM | POA: Diagnosis not present

## 2024-04-27 DIAGNOSIS — D631 Anemia in chronic kidney disease: Secondary | ICD-10-CM | POA: Diagnosis not present

## 2024-04-27 LAB — CBC
HCT: 32.9 % — ABNORMAL LOW (ref 39.0–52.0)
Hemoglobin: 10.7 g/dL — ABNORMAL LOW (ref 13.0–17.0)
MCH: 28.3 pg (ref 26.0–34.0)
MCHC: 32.5 g/dL (ref 30.0–36.0)
MCV: 87 fL (ref 80.0–100.0)
Platelets: 417 K/uL — ABNORMAL HIGH (ref 150–400)
RBC: 3.78 MIL/uL — ABNORMAL LOW (ref 4.22–5.81)
RDW: 16.3 % — ABNORMAL HIGH (ref 11.5–15.5)
WBC: 8.8 K/uL (ref 4.0–10.5)
nRBC: 0 % (ref 0.0–0.2)

## 2024-04-27 LAB — GLUCOSE, CAPILLARY
Glucose-Capillary: 133 mg/dL — ABNORMAL HIGH (ref 70–99)
Glucose-Capillary: 100 mg/dL — ABNORMAL HIGH (ref 70–99)
Glucose-Capillary: 172 mg/dL — ABNORMAL HIGH (ref 70–99)
Glucose-Capillary: 163 mg/dL — ABNORMAL HIGH (ref 70–99)

## 2024-04-27 LAB — RENAL FUNCTION PANEL
Albumin: 3.3 g/dL — ABNORMAL LOW (ref 3.5–5.0)
Anion gap: 21 — ABNORMAL HIGH (ref 5–15)
BUN: 70 mg/dL — ABNORMAL HIGH (ref 8–23)
CO2: 20 mmol/L — ABNORMAL LOW (ref 22–32)
Calcium: 9 mg/dL (ref 8.9–10.3)
Chloride: 92 mmol/L — ABNORMAL LOW (ref 98–111)
Creatinine, Ser: 11.48 mg/dL — ABNORMAL HIGH (ref 0.61–1.24)
GFR, Estimated: 4 mL/min — ABNORMAL LOW (ref >60)
Glucose, Bld: 130 mg/dL — ABNORMAL HIGH (ref 70–99)
Phosphorus: 4.9 mg/dL — ABNORMAL HIGH (ref 2.5–4.6)
Potassium: 3.8 mmol/L (ref 3.5–5.1)
Sodium: 133 mmol/L — ABNORMAL LOW (ref 135–145)

## 2024-04-27 MED ORDER — HEPARIN SODIUM (PORCINE) 1000 UNIT/ML IJ SOLN
INTRAMUSCULAR | Status: AC
  Filled 2024-04-27: qty 3

## 2024-04-27 MED ORDER — APIXABAN 2.5 MG PO TABS: 2.5000 mg | ORAL_TABLET | Freq: Two times a day (BID) | ORAL | Status: DC

## 2024-04-27 MED ORDER — HEPARIN SODIUM (PORCINE) 1000 UNIT/ML DIALYSIS
1000.0000 [IU] | INTRAMUSCULAR | Status: AC | PRN
  Administered 2024-04-27: 1000 [IU] via INTRAVENOUS_CENTRAL

## 2024-04-27 MED ORDER — APIXABAN 2.5 MG PO TABS
2.5000 mg | ORAL_TABLET | Freq: Two times a day (BID) | ORAL | Status: DC
  Administered 2024-04-27 – 2024-04-28 (×2): 2.5 mg via ORAL
  Filled 2024-04-27 (×2): qty 1

## 2024-04-27 MED ORDER — ISOSORB DINITRATE-HYDRALAZINE 20-37.5 MG PO TABS
1.0000 | ORAL_TABLET | Freq: Three times a day (TID) | ORAL | 0 refills | Status: AC
  Filled 2024-04-27: qty 30, 10d supply, fill #0

## 2024-04-27 MED ORDER — APIXABAN 2.5 MG PO TABS
2.5000 mg | ORAL_TABLET | Freq: Two times a day (BID) | ORAL | 11 refills | Status: AC
  Filled 2024-04-27: qty 60, 30d supply, fill #0

## 2024-04-27 MED ORDER — PENTAFLUOROPROP-TETRAFLUOROETH EX AERO: 1.0000 | INHALATION_SPRAY | CUTANEOUS | Status: DC | PRN

## 2024-04-27 MED ORDER — CLONIDINE HCL 0.1 MG PO TABS
0.3000 mg | ORAL_TABLET | Freq: Every day | ORAL | Status: DC
Start: 2024-04-27 — End: 2024-04-28
  Administered 2024-04-27 – 2024-04-28 (×2): 0.3 mg via ORAL
  Filled 2024-04-27 (×2): qty 3

## 2024-04-27 MED ORDER — HEPARIN SODIUM (PORCINE) 1000 UNIT/ML DIALYSIS
1500.0000 [IU] | Freq: Once | INTRAMUSCULAR | Status: AC
  Administered 2024-04-27: 1500 [IU] via INTRAVENOUS_CENTRAL

## 2024-04-27 MED ORDER — ASPIRIN 81 MG PO TBEC: 81.0000 mg | DELAYED_RELEASE_TABLET | Freq: Every day | ORAL | 0 refills | Status: DC

## 2024-04-27 NOTE — Plan of Care (Signed)

## 2024-04-27 NOTE — Progress Notes (Signed)
 PROGRESS NOTE  Austin [name]  DOB: [birth year]  PCP: Austin Huxley, NP FMW:[id number]  DOA: 04/25/2024  LOS: 1 day  Hospital Day: 3  Brief narrative: Austin [name] is a 89 y.o. male with PMH significant for ESRD-HD-MWF, DM2, HTN, HLD, CHF, CKD, carotid artery stenosis, prior stroke, prostate cancer, h/o GI bleed, chronic anemia 9/4, patient was seen at an eye doctor's office with complaint of progressive vision impairment in both eyes for 4 days.  He was noted to have left homonymous hemianopsia and sent to the ED for stroke workup.  In the ED, patient was hemodynamically stable, breathing room air Initial labs with WC count 10, hemoglobin 12.1, CT head did not show any acute intracranial abnormality, showed moderate chronic small vessel ischemic disease. MRI brain showed an acute infarct in the right PCA territory, remote infarct in the right basal ganglia with evidence of prior hemorrhage, additional remote lacunar infarct in the left thalamus Seen by neurology Admitted to [facility] for stroke workup  Subjective: I had seen the patient at dialysis this morning and prepared for discharge postdialysis. After the dialysis, while working with OT, patient became tachycardic to 150.  EKG showed A-fib.  By the time EKG was obtained, his heart rate was down to 110s.  In the next 1 hour since then, heart rate is gradually down to 90s.  No compromise in blood pressure with A-fib. I evaluated the patient with his wife at bedside. Patient did not feel any palpitation, shortness of breath, chest pain with the episode. He does not have any history of A-fib but has history of stroke/TIA in the past.   Last seen by neurology Dr. Jerri yesterday.  Prior to admission, patient was on Plavix , had recommended DAPT for 3 months.  Last dose of DAPT was after dialysis today, 3 hours ago. With the new diagnosis of new onset A-fib, patient needs to be upgraded from DAPT to Eliquis . I spoke with stroke neurologist on-call Dr.  Rosemarie and he agrees with the plan  Examined again this afternoon.  Heart rate continues to fluctuate, went as high as 140s at rest. Will hold off for discharge and continue to monitor overnight.  Assessment and plan: Acute right PCA territory stroke Presented with progressive bilateral vision impairment for 3 to 4 days. Imagings as above. Seen by neurology. Stroke workup initiated Pending CT angio head and neck Echocardiogram with a EF 55%, no WMA, G1 DD, severely dilated LA, no intracardiac source of embolism A1c 7.6, HDL 46, LDL 53 To be seen by PT/OT/ST PTA meds- Plavix .   Because of the stroke and new diagnosis of A-fib, Plavix  has been switched to Eliquis  2.5 mg twice daily.   New onset A-fib Patient had brief episode of A-fib with RVR this afternoon.  New onset A-fib.  Not on any AV nodal blocking agent as an outpatient.   Given his advanced age, dialysis dependence, other comorbidities, and the finding of severely dilated LA on echo, I do not think rhythm control strategy needs to be pursued.  Currently self rate controlled. Prior to admission, patient was on Plavix  only.  After discussion with neurology, he has been now switched to Eliquis  2.5 mg twice daily (renally dosed). I suspect that patient could be having rebound tachycardia due to clonidine  withdrawal.  I would resume clonidine  this afternoon at 0.3 mg  daily.  Was taking 3 times a day at home. If no improvement in heart rate, will add beta-blocker Need to follow-up with cardiology as  an outpatient.    Essential hypertension PTA meds- Cardura  2 mg nightly,clonidine  0.3 mg 3 times daily, isosorbide  dinitrate 1 20 mg 3 times daily, hydralazine  37.5 mg 3 times daily, amlodipine  10 mg daily Currently on amlodipine , Cardura  and BiDil .   Clonidine  resumed.  As discussed below   ESRD-HD-MWF Nephrology plan for maintenance dialysis Last dialysis this morning  Type 2 diabetes mellitus A1c 7.6 in July 2025 PTA  meds-none Currently on SSI/Accu-Cheks Recent Labs  Lab 04/26/24 1153 04/26/24 1634 04/26/24 2103 04/27/24 0636 04/27/24 1240  GLUCAP 148* 175* 182* 133* 100*   HLD Lipitor   Mobility:  PT Orders:   PT Follow up Rec: Home Health Pt9/12/2023 1627   Goals of care   Code Status: Prior     DVT prophylaxis:  apixaban  (ELIQUIS ) tablet 2.5 mg Start: 04/27/24 1630 Place and maintain sequential compression device Start: 04/26/24 1158 apixaban  (ELIQUIS ) tablet 2.5 mg   Antimicrobials: None Fluid: None Consultants: Neurology Family Communication: Wife at bedside  Status: Inpatient Level of care:  Telemetry Medical   Patient is from: Home Needs to continue in-hospital care: Hold of discharge due to A-fib with RVR Anticipated d/c to: Hopefully home tomorrow.    Diet:  Diet Order             Diet general           Diet renal with fluid restriction Fluid restriction: 1200 mL Fluid; Room service appropriate? Yes; Fluid consistency: Thin  Diet effective now                   Scheduled Meds:  amLODipine   10 mg Oral Daily   apixaban   2.5 mg Oral BID   artificial tears  1 drop Both Eyes QHS   atorvastatin   20 mg Oral Daily   Chlorhexidine  Gluconate Cloth  6 each Topical Q0600   cloNIDine   0.3 mg Oral Daily   doxazosin   2 mg Oral QHS   insulin  aspart  0-6 Units Subcutaneous TID WC   isosorbide -hydrALAZINE   1 tablet Oral TID   pantoprazole   40 mg Oral Daily   sevelamer  carbonate  800 mg Oral TID with meals    PRN meds: acetaminophen , lactulose    Infusions:    Antimicrobials: Anti-infectives (From admission, onward)    None       Objective: Vitals:   04/27/24 1430 04/27/24 1500  BP: 136/78 (!) 154/57  Pulse:    Resp: 19 (!) 21  Temp:    SpO2:      Intake/Output Summary (Last 24 hours) at 04/27/2024 1542 Last data filed at 04/27/2024 1400 Gross per 24 hour  Intake 60 ml  Output 1500 ml  Net -1440 ml   Filed Weights   04/26/24 0000 04/27/24  0733  Weight: 76.1 kg 78.2 kg   Weight change:  Body mass index is 27 kg/m.   Physical Exam: General exam: Pleasant, elderly African-American male.  Not in distress Skin: No rashes, lesions or ulcers. HEENT: Atraumatic, normocephalic, no obvious bleeding Lungs: Clear to auscultation bilaterally,  CVS: Tachycardic, irregular rhythm, no murmur GI/Abd: Soft, nontender, nondistended, bowel sound present,   CNS: Alert, awake, able to answer orientation questions Psychiatry: Mood appropriate Extremities: No pedal edema, no calf tenderness,   Data Review: I have personally reviewed the laboratory data and studies available.  F/u labs  Unresulted Labs (From admission, onward)     Start     Ordered   04/26/24 1714  Hepatitis B surface antibody,quantitative  (  New Admission Hemo Labs (Hepatitis B))  ONCE - URGENT,   URGENT       Question:  Specimen collection method  Answer:  Lab=Lab collect   04/26/24 1713   04/25/24 1551  Urine rapid drug screen (hosp performed)  Once,   STAT       Question:  Indication  Answer:  Altered mental status, unspecified R41.82   04/25/24 1551           Signed, Chapman Rota, MD [facility] 04/27/2024
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
## 2024-04-27 NOTE — Plan of Care (Signed)
  Problem: Health Behavior/Discharge Planning: Goal: Ability to manage health-related needs will improve Outcome: Progressing   Problem: Clinical Measurements: Goal: Ability to maintain clinical measurements within normal limits will improve Outcome: Progressing Goal: Will remain free from infection Outcome: Progressing Goal: Diagnostic test results will improve Outcome: Progressing Goal: Respiratory complications will improve Outcome: Progressing   Problem: Education: Goal: Knowledge of General Education information will improve Description: Including pain rating scale, medication(s)/side effects and non-pharmacologic comfort measures Outcome: Progressing

## 2024-04-27 NOTE — Progress Notes (Signed)
 MEWS Progress Note  Patient Details Name: Austin [name] MRN: [id number] DOB: [birth year] Today's Date: 04/27/2024   MEWS Flowsheet Documentation:  Assess: MEWS Score Temp: 98.9 F (37.2 C) BP: (!) 143/46 MAP (mmHg): 73 Pulse Rate: 87 ECG Heart Rate: (!) 114 Resp: (!) 24 Level of Consciousness: Alert SpO2: 95 % O2 Device: Room Air O2 Flow Rate (L/min): 2 L/min Assess: MEWS Score MEWS Temp: 0 MEWS Systolic: 0 MEWS Pulse: 2 MEWS RR: 1 MEWS LOC: 0 MEWS Score: 3 MEWS Score Color: Yellow Assess: SIRS CRITERIA SIRS Temperature : 0 SIRS Respirations : 1 SIRS Pulse: 1 SIRS WBC: 0 SIRS Score Sum : 2 SIRS Temperature : 0 SIRS Pulse: 1 SIRS Respirations : 1 SIRS WBC: 0 SIRS Score Sum : 2 Assess: if the MEWS score is Yellow or Red Were vital signs accurate and taken at a resting state?: Yes Does the patient meet 2 or more of the SIRS criteria?: No MEWS guidelines implemented : Yes, yellow Treat MEWS Interventions: Considered administering scheduled or prn medications/treatments as ordered Take Vital Signs Increase Vital Sign Frequency : Yellow: Q2hr x1, continue Q4hrs until patient remains green for 12hrs Escalate MEWS: Escalate: Yellow: Discuss with charge nurse and consider notifying provider and/or RRT Notify: Charge Nurse/RN Name of Charge Nurse/RN Notified: Jon Stallion Provider Notification Provider Name/Title: Dr. Christen Date Provider Notified: 04/27/24 Time Provider Notified: 1323 Method of Notification: Page Notification Reason: Other (Comment), New onset of dysrhythmia Provider response: En route Date of Provider Response: 04/27/24 Time of Provider Response: 1328      Lucienne PARAS Kiondra Caicedo 04/27/2024, 1:54 PM

## 2024-04-27 NOTE — Discharge Instructions (Signed)
 Information on my medicine - ELIQUIS (apixaban)  This medication education was reviewed with me or my healthcare representative as part of my discharge preparation.  The pharmacist that spoke with me during my hospital stay was:  Carron Brazen, RPH  Why was Eliquis prescribed for you? Eliquis was prescribed for you to reduce the risk of forming blood clots that can cause a stroke if you have a medical condition called atrial fibrillation (a type of irregular heartbeat) OR to reduce the risk of a blood clots forming after orthopedic surgery.  What do You need to know about Eliquis ? Take your Eliquis TWICE DAILY - one tablet in the morning and one tablet in the evening with or without food.  It would be best to take the doses about the same time each day.  If you have difficulty swallowing the tablet whole please discuss with your pharmacist how to take the medication safely.  Take Eliquis exactly as prescribed by your doctor and DO NOT stop taking Eliquis without talking to the doctor who prescribed the medication.  Stopping may increase your risk of developing a new clot or stroke.  Refill your prescription before you run out.  After discharge, you should have regular check-up appointments with your healthcare provider that is prescribing your Eliquis.  In the future your dose may need to be changed if your kidney function or weight changes by a significant amount or as you get older.  What do you do if you miss a dose? If you miss a dose, take it as soon as you remember on the same day and resume taking twice daily.  Do not take more than one dose of ELIQUIS at the same time.  Important Safety Information A possible side effect of Eliquis is bleeding. You should call your healthcare provider right away if you experience any of the following: Bleeding from an injury or your nose that does not stop. Unusual colored urine (red or dark brown) or unusual colored stools (red or  black). Unusual bruising for unknown reasons. A serious fall or if you hit your head (even if there is no bleeding).  Some medicines may interact with Eliquis and might increase your risk of bleeding or clotting while on Eliquis. To help avoid this, consult your healthcare provider or pharmacist prior to using any new prescription or non-prescription medications, including herbals, vitamins, non-steroidal anti-inflammatory drugs (NSAIDs) and supplements.  This website has more information on Eliquis (apixaban): http://www.eliquis.com/eliquis/home

## 2024-04-27 NOTE — Significant Event (Signed)
 I had seen the patient at dialysis this morning and prepared for discharge postdialysis. After the dialysis, while working with OT, patient became tachycardic to 150.  EKG showed A-fib.  By the time EKG was obtained, his heart rate was down to 110s.  In the next 1 hour since then, heart rate is gradually down to 90s.  No compromise in blood pressure with A-fib. I evaluated the patient with his wife at bedside. Patient did not feel any palpitation, shortness of breath, chest pain with the episode. He does not have any history of A-fib but has history of stroke/TIA in the past.  Last seen by neurology Dr. Jerri yesterday.  Prior to admission, patient was on Plavix , had recommended DAPT for 3 months.  Last dose of DAPT was after dialysis today, 3 hours ago. With the new diagnosis of new onset A-fib, patient needs to be upgraded from DAPT to Eliquis . I spoke with stroke neurologist on-call Dr. Rosemarie and he agrees with the plan Since patient is hemodynamically stable, will discharge home home today on Eliquis .  Plan communicated to patient, his wife and bedside RN

## 2024-04-27 NOTE — Progress Notes (Signed)
 ANTICOAGULATION CONSULT NOTE  Pharmacy Consult for Eliquis  Indication: atrial fibrillation  Allergies  Allergen Reactions   Aspirin  Other (See Comments)    High doses causes stomach ulcer and bleeding    Patient Measurements: Height: 5' 7 (170.2 cm) Weight: 78.2 kg (172 lb 6.4 oz) IBW/kg (Calculated) : 66.1  Vital Signs: Temp: 98.9 F (37.2 C) (09/06 1330) Temp Source: Oral (09/06 1206) BP: 143/46 (09/06 1330) Pulse Rate: 87 (09/06 1128)  Labs: Recent Labs    04/25/24 2035 04/25/24 2036 04/27/24 0753 04/27/24 0754  HGB 12.1* 13.9 10.7*  --   HCT 37.8* 41.0 32.9*  --   PLT 433*  --  417*  --   APTT 37*  --   --   --   LABPROT 14.3  --   --   --   INR 1.0  --   --   --   CREATININE 8.24* 8.20*  --  11.48*    Estimated Creatinine Clearance: 4.2 mL/min (A) (by C-G formula based on SCr of 11.48 mg/dL (H)).  Medical History: 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 BLOOD LOSS 03/13/2007   CAROTID ARTERY STENOSIS 05/10/2010   CHF (congestive heart failure) (HCC)    DIABETES MELLITUS, TYPE II 09/19/2007   DISEASE, CEREBROVASCULAR NEC 03/05/2007   GERD 03/13/2007   HYPERLIPIDEMIA 03/05/2007   HYPERTENSION 03/05/2007   HYPOKALEMIA 11/09/2009   KNEE PAIN, RIGHT 11/09/2009   PROSTATE CANCER, HX OF 03/05/2007   RENAL DISEASE, CHRONIC 02/03/2009    Medications:  See MAR  Assessment: 89 yo male admitted with CVA now with new onset Afib.  Not on anticoagulation prior to admission.  Pharmacy consulted for Eliquis .  Per neurology and Dr. Arlice, no plans for antiplatelet therapy with new start DOAC.    Plan:  START Eliquis  2.5 mg BID (age >80yo, SCr > 1.5)  Maurilio Fila, PharmD Clinical Pharmacist 04/27/2024  2:57 PM

## 2024-04-27 NOTE — Progress Notes (Signed)
 Patient working with OT when HR increased suddenly to 150 and then came back down. Upon assessing telemetry patient noted to be in Afib which was confirmed by EKG. Dr. Arlice notified and on way to assess patient. VS's stable with no c/o of chest pain, shortness of breath or palpitations. Discharge held at this time.

## 2024-04-27 NOTE — Plan of Care (Signed)
 Problem: Education: Goal: Knowledge of General Education information will improve Description: Including pain rating scale, medication(s)/side effects and non-pharmacologic comfort measures Outcome: Progressing   Problem: Health Behavior/Discharge Planning: Goal: Ability to manage health-related needs will improve Outcome: Progressing   Problem: Clinical Measurements: Goal: Ability to maintain clinical measurements within normal limits will improve Outcome: Progressing Goal: Will remain free from infection Outcome: Progressing Goal: Diagnostic test results will improve Outcome: Progressing Goal: Respiratory complications will improve Outcome: Progressing Goal: Cardiovascular complication will be avoided Outcome: Progressing   Problem: Activity: Goal: Risk for activity intolerance will decrease Outcome: Progressing   Problem: Nutrition: Goal: Adequate nutrition will be maintained Outcome: Progressing   Problem: Coping: Goal: Level of anxiety will decrease Outcome: Progressing   Problem: Elimination: Goal: Will not experience complications related to bowel motility Outcome: Progressing Goal: Will not experience complications related to urinary retention Outcome: Progressing   Problem: Pain Managment: Goal: General experience of comfort will improve and/or be controlled Outcome: Progressing   Problem: Safety: Goal: Ability to remain free from injury will improve Outcome: Progressing   Problem: Skin Integrity: Goal: Risk for impaired skin integrity will decrease Outcome: Progressing   Problem: Education: Goal: Ability to describe self-care measures that may prevent or decrease complications (Diabetes Survival Skills Education) will improve Outcome: Progressing Goal: Individualized Educational Video(s) Outcome: Progressing   Problem: Coping: Goal: Ability to adjust to condition or change in health will improve Outcome: Progressing   Problem: Fluid  Volume: Goal: Ability to maintain a balanced intake and output will improve Outcome: Progressing   Problem: Health Behavior/Discharge Planning: Goal: Ability to identify and utilize available resources and services will improve Outcome: Progressing Goal: Ability to manage health-related needs will improve Outcome: Progressing   Problem: Metabolic: Goal: Ability to maintain appropriate glucose levels will improve Outcome: Progressing   Problem: Nutritional: Goal: Maintenance of adequate nutrition will improve Outcome: Progressing Goal: Progress toward achieving an optimal weight will improve Outcome: Progressing   Problem: Skin Integrity: Goal: Risk for impaired skin integrity will decrease Outcome: Progressing   Problem: Tissue Perfusion: Goal: Adequacy of tissue perfusion will improve Outcome: Progressing   Problem: Education: Goal: Knowledge of disease or condition will improve Outcome: Progressing Goal: Knowledge of secondary prevention will improve (MUST DOCUMENT ALL) Outcome: Progressing Goal: Knowledge of patient specific risk factors will improve (DELETE if not current risk factor) Outcome: Progressing   Problem: Ischemic Stroke/TIA Tissue Perfusion: Goal: Complications of ischemic stroke/TIA will be minimized Outcome: Progressing   Problem: Coping: Goal: Will verbalize positive feelings about self Outcome: Progressing Goal: Will identify appropriate support needs Outcome: Progressing   Problem: Health Behavior/Discharge Planning: Goal: Ability to manage health-related needs will improve Outcome: Progressing Goal: Goals will be collaboratively established with patient/family Outcome: Progressing   Problem: Self-Care: Goal: Ability to participate in self-care as condition permits will improve Outcome: Progressing Goal: Verbalization of feelings and concerns over difficulty with self-care will improve Outcome: Progressing Goal: Ability to communicate  needs accurately will improve Outcome: Progressing   Problem: Nutrition: Goal: Risk of aspiration will decrease Outcome: Progressing Goal: Dietary intake will improve Outcome: Progressing   Problem: Education: Goal: Knowledge of disease or condition will improve Outcome: Progressing Goal: Knowledge of secondary prevention will improve (MUST DOCUMENT ALL) Outcome: Progressing Goal: Knowledge of patient specific risk factors will improve (DELETE if not current risk factor) Outcome: Progressing   Problem: Ischemic Stroke/TIA Tissue Perfusion: Goal: Complications of ischemic stroke/TIA will be minimized Outcome: Progressing   Problem: Coping: Goal: Will verbalize positive feelings  about self Outcome: Progressing Goal: Will identify appropriate support needs Outcome: Progressing   Problem: Health Behavior/Discharge Planning: Goal: Ability to manage health-related needs will improve Outcome: Progressing Goal: Goals will be collaboratively established with patient/family Outcome: Progressing   Problem: Self-Care: Goal: Ability to participate in self-care as condition permits will improve Outcome: Progressing Goal: Verbalization of feelings and concerns over difficulty with self-care will improve Outcome: Progressing Goal: Ability to communicate needs accurately will improve Outcome: Progressing   Problem: Nutrition: Goal: Risk of aspiration will decrease Outcome: Progressing Goal: Dietary intake will improve Outcome: Progressing

## 2024-04-27 NOTE — Discharge Summary (Signed)
 Physician Discharge Summary  [name] FMW:[id number] DOB: 02-22-1935 DOA: 04/25/2024  PCP: Merna Huxley, NP  Admit date: 04/25/2024 Discharge date: 04/28/2024  Admitted from: Home Discharge disposition: Home with home health  Recommendations at discharge:  Because of the stroke and new diagnosis of A-fib, you have been switched from Plavix  to Eliquis  2.5 mg twice daily Continue blood pressure medicines at previous doses except clonidine  which has been reduced from 0.3 mg 3 times daily to 0.3 mg daily. Need to continue to monitor blood pressure at home and at dialysis and adjust accordingly. Follow-up with cardiology and neurology as an outpatient.  Referrals given. Continue other medicines as before.   Brief narrative: Austin [name] is a 89 y.o. male with PMH significant for ESRD-HD-MWF, DM2, HTN, HLD, CHF, CKD, carotid artery stenosis, prior stroke, prostate cancer, h/o GI bleed, chronic anemia 9/4, patient was seen at an eye doctor's office with complaint of progressive vision impairment in both eyes for 4 days.  He was noted to have left homonymous hemianopsia and sent to the ED for stroke workup.  In the ED, patient was hemodynamically stable, breathing room air Initial labs with WC count 10, hemoglobin 12.1, CT head did not show any acute intracranial abnormality, showed moderate chronic small vessel ischemic disease. MRI brain showed an acute infarct in the right PCA territory, remote infarct in the right basal ganglia with evidence of prior hemorrhage, additional remote lacunar infarct in the left thalamus Seen by neurology Admitted to [facility] for stroke workup  Subjective: Seen and examined this morning.  Pleasant elderly African-American male. Lying down on the bed not in distress. Wife at bedside.  No complaints.  He was prepared for discharge home yesterday but after the dialysis, while working with OT, patient became tachycardic to 150.  EKG showed A-fib.  He did not have any  symptoms of palpitation, shortness of breath, chest pain with the episode. No compromise in blood pressure with A-fib. He does not have any history of A-fib but has history of stroke/TIA in the past. His heart rate continued to remain somewhat elevated throughout the afternoon and hence we decided to keep him another night. Clonidine  was resumed.  Telemetry reviewed this morning.  He converted to normal sinus rhythm last night around 7:15 PM.  Has remained on normal sinus rhythm since then.  Hospital course: Acute right PCA territory stroke Presented with progressive bilateral vision impairment for 3 to 4 days. Imagings as above. Seen by neurology. Stroke workup initiated Pending CT angio head and neck Echocardiogram with a EF 55%, no WMA, G1 DD, severely dilated LA, no intracardiac source of embolism A1c 7.6, HDL 46, LDL 53 To be seen by PT/OT/ST Last seen by neurology Dr. Jerri on 9/5.  Prior to admission, patient was on Plavix , had recommended DAPT for 3 months.   Blood with the new diagnosis of new onset A-fib, patient needs to be upgraded from DAPT to Eliquis . On 9/6, I spoke with stroke neurologist on-call Dr. Rosemarie and he agrees with the plan. Plan to discharge home today on Eliquis  2.5 mg twice daily (renally dosed).  A-fib with RVR  new onset A-fib 9/6, after the dialysis, while working with OT, patient became tachycardic to 150.  EKG showed A-fib.   He did not have any symptoms of palpitation, shortness of breath, chest pain with the episode. No compromise in blood pressure with A-fib. He does not have any history of A-fib but has history of stroke/TIA in the past.  His heart rate continued to remain somewhat elevated throughout the afternoon and hence we decided to keep him another night. Clonidine  was resumed at a lower dose. Telemetry reviewed this morning.  He converted to normal sinus rhythm last night around 7:15 PM.  Has remained on normal sinus rhythm since then.  After  discussion with neurology, he has been now switched to Eliquis  2.5 mg twice daily (renally dosed). Need to follow-up with cardiology as an outpatient.  Referral given  ESRD-HD-MWF Nephrology consulted for maintenance dialysis  Essential hypertension PTA meds- Cardura  2 mg nightly, clonidine  0.3 mg 3 times daily, isosorbide  dinitrate 20 mg 3 times daily, hydralazine  37.5 mg 3 times daily, amlodipine  10 mg daily Permissive hypertension allowed for first 48 to 72 hours Blood pressure medicines gradually resumed.  Currently on antihypertensives at previous doses but clonidine  only at 0.3 mg daily. Need to continue to monitor blood pressure at home and at dialysis and adjust accordingly  Type 2 diabetes mellitus A1c 7.6 in July 2025 PTA meds-none Currently on SSI/Accu-Cheks Recent Labs  Lab 04/27/24 0636 04/27/24 1240 04/27/24 1631 04/27/24 2120 04/28/24 0615  GLUCAP 133* 100* 172* 163* 112*   HLD Lipitor  Goals of care   Code Status: Prior   Diet:  Diet Order             Diet general           Diet renal with fluid restriction Fluid restriction: 1200 mL Fluid; Room service appropriate? Yes; Fluid consistency: Thin  Diet effective now                   Nutritional status:  Body mass index is 26.93 kg/m.       Wounds:  -    Discharge Exam:   Vitals:   04/28/24 0400 04/28/24 0409 04/28/24 0600 04/28/24 0926  BP:    (!) 134/57  Pulse:      Resp: 16  19 15   Temp:    99.9 F (37.7 C)  TempSrc:    Oral  SpO2:    98%  Weight:  78 kg    Height:        Body mass index is 26.93 kg/m.   General exam: Pleasant, elderly African-American male.  Not in distress Skin: No rashes, lesions or ulcers. HEENT: Atraumatic, normocephalic, no obvious bleeding Lungs: Clear to auscultation bilaterally,  CVS: Normal sinus rhythm, no murmur GI/Abd: Soft, nontender, nondistended, bowel sound present,   CNS: Alert, awake, able to answer orientation questions Psychiatry:  Mood appropriate Extremities: No pedal edema, no calf tenderness,   Follow ups:    Follow-up Information     [facility]. Schedule an appointment as soon as possible for a visit in 1 month(s).   Specialty: Neurology Why: stroke clinic Contact information: [address] [place]  [address] [phone]        Merna Huxley, NP Follow up.   Specialty: Family Medicine Contact information: [address] [place] [address] [phone]         [facility] [address] Office Follow up.   Specialty: Cardiology Contact information: [address] [place]   [address] [phone]                Discharge Instructions:   Discharge Instructions     Ambulatory referral to Cardiology   Complete by: As directed    New onset A-fib. Multiple strokes/TIAs.   Ambulatory referral  to Neurology   Complete by: As directed    Follow up with stroke clinic NP at [facility] in about 4-6 weeks. Thanks.   Call MD for:  difficulty breathing, headache or visual disturbances   Complete by: As directed    Call MD for:  extreme fatigue   Complete by: As directed    Call MD for:  hives   Complete by: As directed    Call MD for:  persistant dizziness or light-headedness   Complete by: As directed    Call MD for:  persistant nausea and vomiting   Complete by: As directed    Call MD for:  severe uncontrolled pain   Complete by: As directed    Call MD for:  temperature >100.4   Complete by: As directed    Diet general   Complete by: As directed    Discharge instructions   Complete by: As directed    Recommendations at discharge:   Because of the stroke and new diagnosis of A-fib, you have been switched from Plavix  to Eliquis  2.5 mg twice daily  Continue blood pressure medicines at previous doses except clonidine  which has been reduced from 0.3 mg 3 times daily to 0.3 mg daily.  Need to  continue to monitor blood pressure at home and at dialysis and adjust accordingly.  Follow-up with cardiology and neurology as an outpatient.  Referrals given.  Continue other medicines as before.   General discharge instructions: Follow with Primary MD Merna Huxley, NP in 7 days  Please request your PCP  to go over your hospital tests, procedures, radiology results at the follow up. Please get your medicines reviewed and adjusted.  Your PCP may decide to repeat certain labs or tests as needed. Do not drive, operate heavy machinery, perform activities at heights, swimming or participation in water activities or provide baby sitting services if your were admitted for syncope or siezures until you have seen by Primary MD or a Neurologist and advised to do so again. [place]  Controlled Substance Reporting System database was reviewed. Do not drive, operate heavy machinery, perform activities at heights, swim, participate in water activities or provide baby-sitting services while on medications for pain, sleep and mood until your outpatient physician has reevaluated you and advised to do so again.  You are strongly recommended to comply with the dose, frequency and duration of prescribed medications. Activity: As tolerated with Full fall precautions use walker/cane & assistance as needed Avoid using any recreational substances like cigarette, tobacco, alcohol , or non-prescribed drug. If you experience worsening of your admission symptoms, develop shortness of breath, life threatening emergency, suicidal or homicidal thoughts you must seek medical attention immediately by calling 911 or calling your MD immediately  if symptoms less severe. You must read complete instructions/literature along with all the possible adverse reactions/side effects for all the medicines you take and that have been prescribed to you. Take any new medicine only after you have completely understood and accepted all the  possible adverse reactions/side effects.  Wear Seat belts while driving. You were cared for by a hospitalist during your hospital stay. If you have any questions about your discharge medications or the care you received while you were in the hospital after you are discharged, you can call the unit and ask to speak with the hospitalist or the covering physician. Once you are discharged, your primary care physician will handle any further medical issues. Please note that NO REFILLS for any discharge medications will be  authorized once you are discharged, as it is imperative that you return to your primary care physician (or establish a relationship with a primary care physician if you do not have one).   Increase activity slowly   Complete by: As directed        Discharge Medications:   Allergies as of 04/28/2024       Reactions   Aspirin  Other (See Comments)   High doses causes stomach ulcer and bleeding        Medication List     STOP taking these medications    clopidogrel  75 MG tablet Commonly known as: PLAVIX        TAKE these medications    Accu-Chek Guide test strip Generic drug: glucose blood USE TO CHECK BLOOD GLUCOSE TWICE A DAY OR AS NEEDED   Accu-Chek Softclix Lancets lancets USED TO CHECK BLOOD GLUCOSE TWICE A DAY OR AS NEEDED   acetaminophen  500 MG tablet Commonly known as: TYLENOL  Take 1,000 mg by mouth every 4 (four) hours as needed.   albuterol  108 (90 Base) MCG/ACT inhaler Commonly known as: VENTOLIN  HFA TAKE 2 PUFFS BY MOUTH EVERY 6 HOURS AS NEEDED FOR WHEEZE OR SHORTNESS OF BREATH   amLODipine  10 MG tablet Commonly known as: NORVASC  TAKE 1 TABLET BY MOUTH EVERY DAY   atorvastatin  20 MG tablet Commonly known as: LIPITOR TAKE 1 TABLET BY MOUTH EVERY DAY   cetirizine  5 MG tablet Commonly known as: ZYRTEC  Take 1 tablet (5 mg total) by mouth at bedtime.   cholecalciferol  25 MCG (1000 UNIT) tablet Commonly known as: VITAMIN D3 Take 1,000 Units by  mouth daily.   Citrucel oral powder Generic drug: methylcellulose 1 tablespoon every 2-3 days,alternating with lactulose    cloNIDine  0.3 MG tablet Commonly known as: CATAPRES  Take 1 tablet (0.3 mg total) by mouth daily. Start taking on: April 29, 2024 What changed: when to take this   doxazosin  2 MG tablet Commonly known as: CARDURA  Take 2 mg by mouth at bedtime.   Eliquis  2.5 MG Tabs tablet Generic drug: apixaban  Take 1 tablet (2.5 mg total) by mouth 2 (two) times daily. Notes to patient: This medication is for your new atrial fibrillation (A fib) or irregular heart rhythm to prevent the risk of a stroke.   esomeprazole  40 MG capsule Commonly known as: NEXIUM  TAKE 1 CAPSULE BY MOUTH EVERY DAY   fluticasone  50 MCG/ACT nasal spray Commonly known as: FLONASE  Place 1 spray into both nostrils daily.   guaiFENesin  600 MG 12 hr tablet Commonly known as: MUCINEX  Take 600 mg by mouth at bedtime.   isosorbide -hydrALAZINE  20-37.5 MG tablet Commonly known as: BIDIL  Take 1 tablet by mouth 3 (three) times daily.   ketoconazole  2 % cream Commonly known as: NIZORAL  Apply 1 Application topically 2 (two) times daily.   lactulose  10 GM/15ML solution Commonly known as: CHRONULAC  TAKE 30 ML BY MOUTH TWICE DAILY AS NEEDED   Melatonin 10 MG Tabs Take 10 mg by mouth at bedtime.   MIRCERA IJ Mircera   PreserVision AREDS 2 Caps Take 1 capsule by mouth daily.   sevelamer  carbonate 800 MG tablet Commonly known as: RENVELA  Take 800 mg by mouth 3 (three) times daily.   SYSTANE BALANCE OP Place 1 drop into both eyes at bedtime.          The results of significant diagnostics from this hospitalization (including imaging, microbiology, ancillary and laboratory) are listed below for reference.    Procedures and Diagnostic Studies:   CT  ANGIO HEAD NECK W WO CM Result Date: 04/26/2024 EXAM: CTA HEAD AND NECK WITH AND WITHOUT 04/26/2024 02:04:00 PM TECHNIQUE: CTA of the head and  neck was performed with and without the administration of intravenous contrast. Multiplanar 2D and/or 3D reformatted images are provided for review. Automated exposure control, iterative reconstruction, and/or weight based adjustment of the mA/kV was utilized to reduce the radiation dose to as low as reasonably achievable. Stenosis of the internal carotid arteries measured using NASCET criteria. COMPARISON: MRI head 04/25/2024 and MRA head 12/12/2006 CLINICAL HISTORY: Stroke/TIA, determine embolic source. Loss of Vision. Pt has hiccup X 3 hours. Hiccups caused motion on scans. Best images possible at this time. FINDINGS: CTA NECK: AORTIC ARCH AND ARCH VESSELS: Mild-to-moderate mucosal thickening of the visualized aortic arch. Common version of the brachiocephalic and left common carotid arteries. Atherosclerosis of the subclavian arteries without high-grade stenosis. CERVICAL CAROTID ARTERIES: Tortuosity of the proximal common carotid artery on the right. Mild atherosclerosis of the right carotid bifurcation and proximal right cervical ICA without hemodynamically significant stenosis. Tortuosity of the distal right cervical ICA. Mild atherosclerosis at the left common carotid artery origin. Mild atherosclerosis at the left carotid bifurcation without hemodynamically significant stenosis. CERVICAL VERTEBRAL ARTERIES: The vertebral arteries are patent from the origins to the vertebrobasilar confluence. Atherosclerosis along the right V1 segment resulting in mild-to-moderate stenosis. Additional atherosclerosis of the right V3 and right V4 segments resulting in mild stenosis. Atherosclerosis at the left vertebral artery origin results in moderate stenosis. Atherosclerosis of the left V4 segment results in mild-to-moderate stenosis. There is distal tapering of the left V4 segment. LUNGS AND MEDIASTINUM: Partially visualized right upper lobe nodule which measures up to 1.4 cm seen on series 14 image 1, similar in  appearance to the CT chest from 2024. Partially visualized pleural effusion. SOFT TISSUES: There is mild asymmetric prominence of the left palatine tonsil. Prominence of the thyroid  with multiple nodules, the largest in the upper right thyroid  lobe measuring up to 2.0 cm. Lipoma in the subcutaneous tissues of the left posterolateral neck. BONES: Degenerative changes in the visualized spine. CTA HEAD: ANTERIOR CIRCULATION: The intracranial internal carotid arteries are patent bilaterally. Atherosclerosis of the bilateral carotid siphons. There is moderate stenosis of the right cavernous ICA. Additional moderate stenosis along the supraclinoid right ICA. There is moderate-to-severe stenosis of the left supraclinoid ICA. The middle cerebral arteries are patent bilaterally. There is mild stenosis of the proximal M1 segment right MCA. Mild atherosclerotic irregularity of multiple bilateral MCA branches. There is moderate stenosis at the origin of the A1 segment of the right ACA. The ACAs are patent bilaterally. There is moderate stenosis of the proximal A4 segment of the right ACA. POSTERIOR CIRCULATION: Mild irregularity of the basilar artery. The P1 segment of the right PCA is patent. There is severe narrowing of the proximal P2 segment of the right PCA. Additional moderate stenosis of the P3 segment right PCA. The left PCA is patent without high-grade stenosis. The superior cerebellar arteries are patent bilaterally. OTHER: No dural venous sinus thrombosis on this non-dedicated study. Nonspecific hypoattenuation in the periventricular and subcortical white matter, most likely representing chronic small vessel disease. Remote lacunar infarct in the right basal ganglia. Generalized parenchymal volume loss. Bilateral lens replacement. Complete opacification of the right maxillary sinus with areas of inspissated secretions versus fungal colonization. Additional mucosal thickening in the right anterior ethmoid air cells  and right frontal sinus. Mucosal thickening in the alveolar recess of the left maxillary sinus. IMPRESSION:  1. No large vessel occlusion or aneurysm in the head or neck. 2. Multiple areas of intracranial atherosclerosis and stenosis. Severe stenosis of the proximal P2 segment of the right PCA. Moderate stenosis of the right cavernous and supraclinoid ICA and moderate-to-severe stenosis of the left supraclinoid ICA. 3. Mild-to-moderate atherosclerosis and stenosis of the cervical vertebral arteries, including moderate stenosis at the left vertebral artery origin and mild-to-moderate stenosis of the left V4 segment with distal tapering. 4. Partially visualized 1.4cm nodule in the right upper lobe, similar to CT from 2024. 5. Asymmetric prominence of the left palatine tonsil. Recommend correlation with direct visualization. 6. Thyroid  nodules measuring up to 2.0 cm. Recommend nonemergent correlation with thyroid  ultrasound. Electronically signed by: Donnice Mania MD 04/26/2024 03:00 PM EDT RP Workstation: [id number]   ECHOCARDIOGRAM COMPLETE Result Date: 04/26/2024    ECHOCARDIOGRAM REPORT   Patient Name:   Austin [name] Date of Exam: 04/26/2024 Medical Rec #:  [id number]  Height:       67.0 in Accession #:    [id number] Weight:       167.8 lb Date of Birth:  May 13, 1935  BSA:          1.877 m Patient Age:    89 years   BP:           160/61 mmHg Patient Gender: M          HR:           89 bpm. Exam Location:  Inpatient Procedure: 2D Echo, Cardiac Doppler and Color Doppler (Both Spectral and Color            Flow Doppler were utilized during procedure). Indications:    Stroke I63.9  History:        Patient has prior history of Echocardiogram examinations, most                 recent 10/18/2021. CHF; Risk Factors:Hypertension and Diabetes.  Sonographer:    BERNARDA ROCKS Referring Phys: [id number] JINDONG XU IMPRESSIONS  1. Distal septal hypokinesis. Left ventricular ejection fraction, by estimation, is 55%. The left ventricle has  low normal function. The left ventricle has no regional wall motion abnormalities. Left ventricular diastolic parameters are consistent with Grade I diastolic dysfunction (impaired relaxation).  2. Right ventricular systolic function is normal. The right ventricular size is normal.  3. Left atrial size was severely dilated.  4. The mitral valve is degenerative. Trivial mitral valve regurgitation. No evidence of mitral stenosis. Moderate mitral annular calcification.  5. The aortic valve is tricuspid. There is moderate calcification of the aortic valve. There is moderate thickening of the aortic valve. Aortic valve regurgitation is trivial. Aortic valve sclerosis/calcification is present, without any evidence of aortic stenosis.  6. The inferior vena cava is normal in size with greater than 50% respiratory variability, suggesting right atrial pressure of 3 mmHg. FINDINGS  Left Ventricle: Distal septal hypokinesis. Left ventricular ejection fraction, by estimation, is 55%. The left ventricle has low normal function. The left ventricle has no regional wall motion abnormalities. Strain was performed and the global longitudinal strain is indeterminate. The left ventricular internal cavity size was normal in size. There is no left ventricular hypertrophy. Left ventricular diastolic parameters are consistent with Grade I diastolic dysfunction (impaired relaxation). Right Ventricle: The right ventricular size is normal. No increase in right ventricular wall thickness. Right ventricular systolic function is normal. Left Atrium: Left atrial size was severely dilated. Right Atrium: Right atrial size was normal in size. Pericardium:  There is no evidence of pericardial effusion. Mitral Valve: The mitral valve is degenerative in appearance. There is moderate thickening of the mitral valve leaflet(s). There is moderate calcification of the mitral valve leaflet(s). Moderate mitral annular calcification. Trivial mitral valve  regurgitation. No evidence of mitral valve stenosis. MV peak gradient, 6.2 mmHg. The mean mitral valve gradient is 3.0 mmHg. Tricuspid Valve: The tricuspid valve is normal in structure. Tricuspid valve regurgitation is trivial. No evidence of tricuspid stenosis. Aortic Valve: The aortic valve is tricuspid. There is moderate calcification of the aortic valve. There is moderate thickening of the aortic valve. Aortic valve regurgitation is trivial. Aortic regurgitation PHT measures 633 msec. Aortic valve sclerosis/calcification is present, without any evidence of aortic stenosis. Aortic valve mean gradient measures 9.3 mmHg. Aortic valve peak gradient measures 25.3 mmHg. Aortic valve area, by VTI measures 3.31 cm. Pulmonic Valve: The pulmonic valve was normal in structure. Pulmonic valve regurgitation is trivial. No evidence of pulmonic stenosis. Aorta: The aortic root is normal in size and structure. Venous: The inferior vena cava is normal in size with greater than 50% respiratory variability, suggesting right atrial pressure of 3 mmHg. IAS/Shunts: No atrial level shunt detected by color flow Doppler. Additional Comments: 3D was performed not requiring image post processing on an independent workstation and was indeterminate.  LEFT VENTRICLE PLAX 2D LVIDd:         5.80 cm      Diastology LVIDs:         3.40 cm      LV e' medial:    6.85 cm/s LV PW:         1.00 cm      LV E/e' medial:  12.1 LV IVS:        1.00 cm      LV e' lateral:   11.50 cm/s LVOT diam:     2.30 cm      LV E/e' lateral: 7.2 LV SV:         130 LV SV Index:   69 LVOT Area:     4.15 cm  LV Volumes (MOD) LV vol d, MOD A2C: 172.0 ml LV vol d, MOD A4C: 181.0 ml LV vol s, MOD A2C: 62.9 ml LV vol s, MOD A4C: 75.2 ml LV SV MOD A2C:     109.1 ml LV SV MOD A4C:     181.0 ml LV SV MOD BP:      107.7 ml RIGHT VENTRICLE             IVC RV Basal diam:  4.30 cm     IVC diam: 1.40 cm RV S prime:     24.80 cm/s TAPSE (M-mode): 2.1 cm LEFT ATRIUM             Index         RIGHT ATRIUM           Index LA diam:        5.00 cm 2.66 cm/m   RA Area:     18.60 cm LA Vol (A2C):   87.8 ml 46.78 ml/m  RA Volume:   52.70 ml  28.08 ml/m LA Vol (A4C):   79.2 ml 42.20 ml/m LA Biplane Vol: 89.5 ml 47.69 ml/m  AORTIC VALVE                     PULMONIC VALVE AV Area (Vmax):    2.58 cm      PV Vmax:  1.00 m/s AV Area (Vmean):   2.77 cm      PV Peak grad:     4.0 mmHg AV Area (VTI):     3.31 cm      PR End Diast Vel: 0.53 msec AV Vmax:           251.67 cm/s AV Vmean:          134.000 cm/s AV VTI:            0.393 m AV Peak Grad:      25.3 mmHg AV Mean Grad:      9.3 mmHg LVOT Vmax:         156.00 cm/s LVOT Vmean:        89.200 cm/s LVOT VTI:          0.313 m LVOT/AV VTI ratio: 0.80 AI PHT:            633 msec  AORTA Ao Root diam: 3.50 cm Ao Asc diam:  3.70 cm MITRAL VALVE MV Area (PHT): 3.63 cm     SHUNTS MV Area VTI:   3.58 cm     Systemic VTI:  0.31 m MV Peak grad:  6.2 mmHg     Systemic Diam: 2.30 cm MV Mean grad:  3.0 mmHg MV Vmax:       1.24 m/s MV Vmean:      74.4 cm/s MV Decel Time: 209 msec MV E velocity: 82.60 cm/s MV A velocity: 121.00 cm/s MV E/A ratio:  0.68 Maude Emmer MD Electronically signed by Maude Emmer MD Signature Date/Time: 04/26/2024/11:12:59 AM    Final    MR BRAIN WO CONTRAST Result Date: 04/25/2024 EXAM: MRI BRAIN WITHOUT CONTRAST 04/25/2024 07:02:59 PM TECHNIQUE: Multiplanar multisequence MRI of the head/brain was performed without the administration of intravenous contrast. COMPARISON: Same day CT head and MRI head dated 12/12/2006. CLINICAL HISTORY: Transient ischemic attack (TIA). FINDINGS: BRAIN AND VENTRICLES: Restricted diffusion throughout the cortex and subcortical white matter within the right occipital lobe involving the right PCA territory. Restricted diffusion also extends into the posterior right temporal lobe and right hippocampus. There are nonspecific hyperintense foci in the subcortical and periventricular white matter that most  likely represent chronic microangiopathic ischemic changes in a patient of this age. Mild-to-moderate generalized parenchymal volume loss. There is a remote infarct in the right basal ganglia with evidence of prior hemorrhage. Additional remote lacunar infarcts in the left thalamus. Focus of similar signal abnormality and susceptibility within the left parietal lobe which may reflect focus of remote hemorrhage versus vascular malformations such as cavernous malformation. There is an additional similar focus of signal abnormality and susceptibility along the right dorsal aspect of the pons at the level of the superior cerebellar peduncle. ORBITS: Bilateral lens replacement. SINUSES AND MASTOIDS: Mucosal thickening in the paranasal sinuses involving the right frontal sinus, right anterior ethmoidary cells, and right maxillary sinus. Paranasal sinus disease in a right ostiomeatal pattern. 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 IMPRESSION: 1. Acute infarct in the right PCA territory as above. 2. 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 3. Additional remote lacunar infarcts in the left thalamus. 4. Focus of signal abnormality and susceptibility within the left parietal lobe, possibly representing remote hemorrhage or vascular malformation such as cavernous malformation. Additional similar focus of signal abnormality and susceptibility along the right dorsal aspect of the pons. Electronically signed by: Donnice Mania MD  04/25/2024 07:33 PM EDT RP Workstation: [id number]   CT HEAD WO CONTRAST Result Date: 04/25/2024 CLINICAL DATA:  Transient ischemic attack (TIA). Bilateral visual loss for 4 days. EXAM: CT HEAD WITHOUT CONTRAST TECHNIQUE: Contiguous axial images were obtained from the base of the skull through the vertex without intravenous contrast. RADIATION DOSE REDUCTION: This exam was performed according to the  departmental dose-optimization program which includes automated exposure control, adjustment of the mA and/or kV according to patient size and/or use of iterative reconstruction technique. COMPARISON:  Head CT 01/25/2024 and MRI 12/12/2006 FINDINGS: Brain: There is no evidence of an acute infarct, intracranial hemorrhage, mass, midline shift, or extra-axial fluid collection. There is mild cerebral atrophy. Patchy cerebral white matter hypodensities are similar to the prior CT and nonspecific but compatible with moderate chronic small vessel ischemic disease. A chronic lacunar infarct in the right basal ganglia is unchanged. A normal variant cavum septum pellucidum et vergae is noted. Vascular: Calcified atherosclerosis at the skull base. No hyperdense vessel. Skull: No fracture or suspicious lesion. Sinuses/Orbits: Chronic right maxillary sinusitis with persistent complete opacification and mild expansion of the included portion of the sinus. Clear mastoid air cells. Bilateral cataract extraction. Other: 4 cm left postauricular lipoma. IMPRESSION: 1. No evidence of acute intracranial abnormality. 2. Moderate chronic small vessel ischemic disease. Electronically Signed   By: Dasie Hamburg M.D.   On: 04/25/2024 16:32     Labs:   Basic Metabolic Panel: Recent Labs  Lab 04/25/24 2035 04/25/24 2036 04/27/24 0754  NA 139 138 133*  K 3.7 3.7 3.8  CL 95* 99 92*  CO2 24  --  20*  GLUCOSE 153* 153* 130*  BUN 44* 47* 70*  CREATININE 8.24* 8.20* 11.48*  CALCIUM  9.8  --  9.0  PHOS  --   --  4.9*   GFR Estimated Creatinine Clearance: 4.2 mL/min (A) (by C-G formula based on SCr of 11.48 mg/dL (H)). Liver Function Tests: Recent Labs  Lab 04/25/24 2035 04/27/24 0754  AST 13*  --   ALT 8  --   ALKPHOS 93  --   BILITOT 1.1  --   PROT 8.5*  --   ALBUMIN 3.8 3.3*   No results for input(s): LIPASE, AMYLASE in the last 168 hours. No results for input(s): AMMONIA in the last 168 hours. Coagulation  profile Recent Labs  Lab 04/25/24 2035  INR 1.0    CBC: Recent Labs  Lab 04/25/24 2035 04/25/24 2036 04/27/24 0753  WBC 10.1  --  8.8  NEUTROABS 7.7  --   --   HGB 12.1* 13.9 10.7*  HCT 37.8* 41.0 32.9*  MCV 90.9  --  87.0  PLT 433*  --  417*   Cardiac Enzymes: No results for input(s): CKTOTAL, CKMB, CKMBINDEX, TROPONINI in the last 168 hours. BNP: Invalid input(s): POCBNP CBG: Recent Labs  Lab 04/27/24 0636 04/27/24 1240 04/27/24 1631 04/27/24 2120 04/28/24 0615  GLUCAP 133* 100* 172* 163* 112*   D-Dimer No results for input(s): DDIMER in the last 72 hours. Hgb A1c No results for input(s): HGBA1C in the last 72 hours. Lipid Profile No results for input(s): CHOL, HDL, LDLCALC, TRIG, CHOLHDL, LDLDIRECT in the last 72 hours. Thyroid  function studies No results for input(s): TSH, T4TOTAL, T3FREE, THYROIDAB in the last 72 hours.  Invalid input(s): FREET3 Anemia work up No results for input(s): VITAMINB12, FOLATE, FERRITIN, TIBC, IRON , RETICCTPCT in the last 72 hours. Microbiology No results found for this or any previous visit (from the  past 240 hours).  Time coordinating discharge: 45 minutes  Signed: Thelton Graca  [facility] 04/28/2024, 10:20 AM

## 2024-04-27 NOTE — Progress Notes (Signed)
 [facility] Progress Note   Subjective:    Seen and examined patient on HD. Tolerating UFG 1.5L. BP is 166/67. Admitted for an acute CVA and followed by Neurology.  Objective Vitals:   04/27/24 1000 04/27/24 1030 04/27/24 1100 04/27/24 1122  BP: (!) 174/65 (!) 158/64 (!) 149/56 (!) 155/67  Pulse: 84 85 84 94  Resp: 15 16 13 18   Temp:      TempSrc:      SpO2: 97% 98% 98% 97%  Weight:      Height:       Physical Exam General: Awake, alert, on RA, NAD Heart: S1 and S2; No murmurs, gallops, or rubs Lungs: Clear anteriorly Abdomen:Soft and non-tender Extremities: Trace LE edema Dialysis Access: AVG   Filed Weights   04/26/24 0000 04/27/24 0733  Weight: 76.1 kg 78.2 kg   No intake or output data in the 24 hours ending 04/27/24 1127  Additional Objective Labs: Basic Metabolic Panel: Recent Labs  Lab 04/25/24 2035 04/25/24 2036 04/27/24 0754  NA 139 138 133*  K 3.7 3.7 3.8  CL 95* 99 92*  CO2 24  --  20*  GLUCOSE 153* 153* 130*  BUN 44* 47* 70*  CREATININE 8.24* 8.20* 11.48*  CALCIUM  9.8  --  9.0  PHOS  --   --  4.9*   Liver Function Tests: Recent Labs  Lab 04/25/24 2035 04/27/24 0754  AST 13*  --   ALT 8  --   ALKPHOS 93  --   BILITOT 1.1  --   PROT 8.5*  --   ALBUMIN 3.8 3.3*   No results for input(s): LIPASE, AMYLASE in the last 168 hours. CBC: Recent Labs  Lab 04/25/24 2035 04/25/24 2036 04/27/24 0753  WBC 10.1  --  8.8  NEUTROABS 7.7  --   --   HGB 12.1* 13.9 10.7*  HCT 37.8* 41.0 32.9*  MCV 90.9  --  87.0  PLT 433*  --  417*   Blood Culture    Component Value Date/Time   SDES BLOOD RIGHT HAND 10/25/2022 0615   SPECREQUEST  10/25/2022 0615    BOTTLES DRAWN AEROBIC AND ANAEROBIC Blood Culture results may not be optimal due to an inadequate volume of blood received in culture bottles   CULT  10/25/2022 0615    NO GROWTH 5 DAYS Performed at [facility], [address], [place], [place] [address]    REPTSTATUS  10/30/2022 FINAL 10/25/2022 0615    Cardiac Enzymes: No results for input(s): CKTOTAL, CKMB, CKMBINDEX, TROPONINI in the last 168 hours. CBG: Recent Labs  Lab 04/26/24 0621 04/26/24 1153 04/26/24 1634 04/26/24 2103 04/27/24 0636  GLUCAP 146* 148* 175* 182* 133*   Iron  Studies: No results for input(s): IRON , TIBC, TRANSFERRIN, FERRITIN in the last 72 hours. Lab Results  Component Value Date   INR 1.0 04/25/2024   INR 1.2 10/25/2022   INR 1.11 11/04/2016   Studies/Results: CT ANGIO HEAD NECK W WO CM Result Date: 04/26/2024 EXAM: CTA HEAD AND NECK WITH AND WITHOUT 04/26/2024 02:04:00 PM TECHNIQUE: CTA of the head and neck was performed with and without the administration of intravenous contrast. Multiplanar 2D and/or 3D reformatted images are provided for review. Automated exposure control, iterative reconstruction, and/or weight based adjustment of the mA/kV was utilized to reduce the radiation dose to as low as reasonably achievable. Stenosis of the internal carotid arteries measured using NASCET criteria. COMPARISON: MRI head 04/25/2024 and MRA head 12/12/2006 CLINICAL HISTORY: Stroke/TIA,  determine embolic source. Loss of Vision. Pt has hiccup X 3 hours. Hiccups caused motion on scans. Best images possible at this time. FINDINGS: CTA NECK: AORTIC ARCH AND ARCH VESSELS: Mild-to-moderate mucosal thickening of the visualized aortic arch. Common version of the brachiocephalic and left common carotid arteries. Atherosclerosis of the subclavian arteries without high-grade stenosis. CERVICAL CAROTID ARTERIES: Tortuosity of the proximal common carotid artery on the right. Mild atherosclerosis of the right carotid bifurcation and proximal right cervical ICA without hemodynamically significant stenosis. Tortuosity of the distal right cervical ICA. Mild atherosclerosis at the left common carotid artery origin. Mild atherosclerosis at the left carotid bifurcation without hemodynamically  significant stenosis. CERVICAL VERTEBRAL ARTERIES: The vertebral arteries are patent from the origins to the vertebrobasilar confluence. Atherosclerosis along the right V1 segment resulting in mild-to-moderate stenosis. Additional atherosclerosis of the right V3 and right V4 segments resulting in mild stenosis. Atherosclerosis at the left vertebral artery origin results in moderate stenosis. Atherosclerosis of the left V4 segment results in mild-to-moderate stenosis. There is distal tapering of the left V4 segment. LUNGS AND MEDIASTINUM: Partially visualized right upper lobe nodule which measures up to 1.4 cm seen on series 14 image 1, similar in appearance to the CT chest from 2024. Partially visualized pleural effusion. SOFT TISSUES: There is mild asymmetric prominence of the left palatine tonsil. Prominence of the thyroid  with multiple nodules, the largest in the upper right thyroid  lobe measuring up to 2.0 cm. Lipoma in the subcutaneous tissues of the left posterolateral neck. BONES: Degenerative changes in the visualized spine. CTA HEAD: ANTERIOR CIRCULATION: The intracranial internal carotid arteries are patent bilaterally. Atherosclerosis of the bilateral carotid siphons. There is moderate stenosis of the right cavernous ICA. Additional moderate stenosis along the supraclinoid right ICA. There is moderate-to-severe stenosis of the left supraclinoid ICA. The middle cerebral arteries are patent bilaterally. There is mild stenosis of the proximal M1 segment right MCA. Mild atherosclerotic irregularity of multiple bilateral MCA branches. There is moderate stenosis at the origin of the A1 segment of the right ACA. The ACAs are patent bilaterally. There is moderate stenosis of the proximal A4 segment of the right ACA. POSTERIOR CIRCULATION: Mild irregularity of the basilar artery. The P1 segment of the right PCA is patent. There is severe narrowing of the proximal P2 segment of the right PCA. Additional moderate  stenosis of the P3 segment right PCA. The left PCA is patent without high-grade stenosis. The superior cerebellar arteries are patent bilaterally. OTHER: No dural venous sinus thrombosis on this non-dedicated study. Nonspecific hypoattenuation in the periventricular and subcortical white matter, most likely representing chronic small vessel disease. Remote lacunar infarct in the right basal ganglia. Generalized parenchymal volume loss. Bilateral lens replacement. Complete opacification of the right maxillary sinus with areas of inspissated secretions versus fungal colonization. Additional mucosal thickening in the right anterior ethmoid air cells and right frontal sinus. Mucosal thickening in the alveolar recess of the left maxillary sinus. IMPRESSION: 1. No large vessel occlusion or aneurysm in the head or neck. 2. Multiple areas of intracranial atherosclerosis and stenosis. Severe stenosis of the proximal P2 segment of the right PCA. Moderate stenosis of the right cavernous and supraclinoid ICA and moderate-to-severe stenosis of the left supraclinoid ICA. 3. Mild-to-moderate atherosclerosis and stenosis of the cervical vertebral arteries, including moderate stenosis at the left vertebral artery origin and mild-to-moderate stenosis of the left V4 segment with distal tapering. 4. Partially visualized 1.4cm nodule in the right upper lobe, similar to CT from 2024. 5.  Asymmetric prominence of the left palatine tonsil. Recommend correlation with direct visualization. 6. Thyroid  nodules measuring up to 2.0 cm. Recommend nonemergent correlation with thyroid  ultrasound. Electronically signed by: Donnice Mania MD 04/26/2024 03:00 PM EDT RP Workstation: [id number]   ECHOCARDIOGRAM COMPLETE Result Date: 04/26/2024    ECHOCARDIOGRAM REPORT   Patient Name:   Austin [name] Date of Exam: 04/26/2024 Medical Rec #:  [id number]  Height:       67.0 in Accession #:    [id number] Weight:       167.8 lb Date of Birth:  1934/12/17  BSA:           1.877 m Patient Age:    89 years   BP:           160/61 mmHg Patient Gender: M          HR:           89 bpm. Exam Location:  Inpatient Procedure: 2D Echo, Cardiac Doppler and Color Doppler (Both Spectral and Color            Flow Doppler were utilized during procedure). Indications:    Stroke I63.9  History:        Patient has prior history of Echocardiogram examinations, most                 recent 10/18/2021. CHF; Risk Factors:Hypertension and Diabetes.  Sonographer:    BERNARDA ROCKS Referring Phys: [id number] JINDONG XU IMPRESSIONS  1. Distal septal hypokinesis. Left ventricular ejection fraction, by estimation, is 55%. The left ventricle has low normal function. The left ventricle has no regional wall motion abnormalities. Left ventricular diastolic parameters are consistent with Grade I diastolic dysfunction (impaired relaxation).  2. Right ventricular systolic function is normal. The right ventricular size is normal.  3. Left atrial size was severely dilated.  4. The mitral valve is degenerative. Trivial mitral valve regurgitation. No evidence of mitral stenosis. Moderate mitral annular calcification.  5. The aortic valve is tricuspid. There is moderate calcification of the aortic valve. There is moderate thickening of the aortic valve. Aortic valve regurgitation is trivial. Aortic valve sclerosis/calcification is present, without any evidence of aortic stenosis.  6. The inferior vena cava is normal in size with greater than 50% respiratory variability, suggesting right atrial pressure of 3 mmHg. FINDINGS  Left Ventricle: Distal septal hypokinesis. Left ventricular ejection fraction, by estimation, is 55%. The left ventricle has low normal function. The left ventricle has no regional wall motion abnormalities. Strain was performed and the global longitudinal strain is indeterminate. The left ventricular internal cavity size was normal in size. There is no left ventricular hypertrophy. Left ventricular  diastolic parameters are consistent with Grade I diastolic dysfunction (impaired relaxation). Right Ventricle: The right ventricular size is normal. No increase in right ventricular wall thickness. Right ventricular systolic function is normal. Left Atrium: Left atrial size was severely dilated. Right Atrium: Right atrial size was normal in size. Pericardium: There is no evidence of pericardial effusion. Mitral Valve: The mitral valve is degenerative in appearance. There is moderate thickening of the mitral valve leaflet(s). There is moderate calcification of the mitral valve leaflet(s). Moderate mitral annular calcification. Trivial mitral valve regurgitation. No evidence of mitral valve stenosis. MV peak gradient, 6.2 mmHg. The mean mitral valve gradient is 3.0 mmHg. Tricuspid Valve: The tricuspid valve is normal in structure. Tricuspid valve regurgitation is trivial. No evidence of tricuspid stenosis. Aortic Valve: The aortic valve is tricuspid. There is moderate calcification of the  aortic valve. There is moderate thickening of the aortic valve. Aortic valve regurgitation is trivial. Aortic regurgitation PHT measures 633 msec. Aortic valve sclerosis/calcification is present, without any evidence of aortic stenosis. Aortic valve mean gradient measures 9.3 mmHg. Aortic valve peak gradient measures 25.3 mmHg. Aortic valve area, by VTI measures 3.31 cm. Pulmonic Valve: The pulmonic valve was normal in structure. Pulmonic valve regurgitation is trivial. No evidence of pulmonic stenosis. Aorta: The aortic root is normal in size and structure. Venous: The inferior vena cava is normal in size with greater than 50% respiratory variability, suggesting right atrial pressure of 3 mmHg. IAS/Shunts: No atrial level shunt detected by color flow Doppler. Additional Comments: 3D was performed not requiring image post processing on an independent workstation and was indeterminate.  LEFT VENTRICLE PLAX 2D LVIDd:         5.80 cm       Diastology LVIDs:         3.40 cm      LV e' medial:    6.85 cm/s LV PW:         1.00 cm      LV E/e' medial:  12.1 LV IVS:        1.00 cm      LV e' lateral:   11.50 cm/s LVOT diam:     2.30 cm      LV E/e' lateral: 7.2 LV SV:         130 LV SV Index:   69 LVOT Area:     4.15 cm  LV Volumes (MOD) LV vol d, MOD A2C: 172.0 ml LV vol d, MOD A4C: 181.0 ml LV vol s, MOD A2C: 62.9 ml LV vol s, MOD A4C: 75.2 ml LV SV MOD A2C:     109.1 ml LV SV MOD A4C:     181.0 ml LV SV MOD BP:      107.7 ml RIGHT VENTRICLE             IVC RV Basal diam:  4.30 cm     IVC diam: 1.40 cm RV S prime:     24.80 cm/s TAPSE (M-mode): 2.1 cm LEFT ATRIUM             Index        RIGHT ATRIUM           Index LA diam:        5.00 cm 2.66 cm/m   RA Area:     18.60 cm LA Vol (A2C):   87.8 ml 46.78 ml/m  RA Volume:   52.70 ml  28.08 ml/m LA Vol (A4C):   79.2 ml 42.20 ml/m LA Biplane Vol: 89.5 ml 47.69 ml/m  AORTIC VALVE                     PULMONIC VALVE AV Area (Vmax):    2.58 cm      PV Vmax:          1.00 m/s AV Area (Vmean):   2.77 cm      PV Peak grad:     4.0 mmHg AV Area (VTI):     3.31 cm      PR End Diast Vel: 0.53 msec AV Vmax:           251.67 cm/s AV Vmean:          134.000 cm/s AV VTI:            0.393 m AV Peak  Grad:      25.3 mmHg AV Mean Grad:      9.3 mmHg LVOT Vmax:         156.00 cm/s LVOT Vmean:        89.200 cm/s LVOT VTI:          0.313 m LVOT/AV VTI ratio: 0.80 AI PHT:            633 msec  AORTA Ao Root diam: 3.50 cm Ao Asc diam:  3.70 cm MITRAL VALVE MV Area (PHT): 3.63 cm     SHUNTS MV Area VTI:   3.58 cm     Systemic VTI:  0.31 m MV Peak grad:  6.2 mmHg     Systemic Diam: 2.30 cm MV Mean grad:  3.0 mmHg MV Vmax:       1.24 m/s MV Vmean:      74.4 cm/s MV Decel Time: 209 msec MV E velocity: 82.60 cm/s MV A velocity: 121.00 cm/s MV E/A ratio:  0.68 Maude Emmer MD Electronically signed by Maude Emmer MD Signature Date/Time: 04/26/2024/11:12:59 AM    Final    MR BRAIN WO CONTRAST Result Date: 04/25/2024 EXAM:  MRI BRAIN WITHOUT CONTRAST 04/25/2024 07:02:59 PM TECHNIQUE: Multiplanar multisequence MRI of the head/brain was performed without the administration of intravenous contrast. COMPARISON: Same day CT head and MRI head dated 12/12/2006. CLINICAL HISTORY: Transient ischemic attack (TIA). FINDINGS: BRAIN AND VENTRICLES: Restricted diffusion throughout the cortex and subcortical white matter within the right occipital lobe involving the right PCA territory. Restricted diffusion also extends into the posterior right temporal lobe and right hippocampus. There are nonspecific hyperintense foci in the subcortical and periventricular white matter that most likely represent chronic microangiopathic ischemic changes in a patient of this age. Mild-to-moderate generalized parenchymal volume loss. There is a remote infarct in the right basal ganglia with evidence of prior hemorrhage. Additional remote lacunar infarcts in the left thalamus. Focus of similar signal abnormality and susceptibility within the left parietal lobe which may reflect focus of remote hemorrhage versus vascular malformations such as cavernous malformation. There is an additional similar focus of signal abnormality and susceptibility along the right dorsal aspect of the pons at the level of the superior cerebellar peduncle. ORBITS: Bilateral lens replacement. SINUSES AND MASTOIDS: Mucosal thickening in the paranasal sinuses involving the right frontal sinus, right anterior ethmoidary cells, and right maxillary sinus. Paranasal sinus disease in a right ostiomeatal pattern. 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 IMPRESSION: 1. Acute infarct in the right PCA territory as above. 2. 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 3. Additional remote lacunar infarcts in the left thalamus. 4. Focus of signal abnormality and susceptibility within the left parietal lobe,  possibly representing remote hemorrhage or vascular malformation such as cavernous malformation. Additional similar focus of signal abnormality and susceptibility along the right dorsal aspect of the pons. Electronically signed by: Donnice Mania MD 04/25/2024 07:33 PM EDT RP Workstation: [id number]   CT HEAD WO CONTRAST Result Date: 04/25/2024 CLINICAL DATA:  Transient ischemic attack (TIA). Bilateral visual loss for 4 days. EXAM: CT HEAD WITHOUT CONTRAST TECHNIQUE: Contiguous axial images were obtained from the base of the skull through the vertex without intravenous contrast. RADIATION DOSE REDUCTION: This exam was performed according to the departmental dose-optimization program which includes automated exposure control, adjustment of the mA and/or kV according to patient size and/or use of iterative reconstruction technique.  COMPARISON:  Head CT 01/25/2024 and MRI 12/12/2006 FINDINGS: Brain: There is no evidence of an acute infarct, intracranial hemorrhage, mass, midline shift, or extra-axial fluid collection. There is mild cerebral atrophy. Patchy cerebral white matter hypodensities are similar to the prior CT and nonspecific but compatible with moderate chronic small vessel ischemic disease. A chronic lacunar infarct in the right basal ganglia is unchanged. A normal variant cavum septum pellucidum et vergae is noted. Vascular: Calcified atherosclerosis at the skull base. No hyperdense vessel. Skull: No fracture or suspicious lesion. Sinuses/Orbits: Chronic right maxillary sinusitis with persistent complete opacification and mild expansion of the included portion of the sinus. Clear mastoid air cells. Bilateral cataract extraction. Other: 4 cm left postauricular lipoma. IMPRESSION: 1. No evidence of acute intracranial abnormality. 2. Moderate chronic small vessel ischemic disease. Electronically Signed   By: Dasie Hamburg M.D.   On: 04/25/2024 16:32    Medications:    stroke: early stages of recovery book    Does not apply Once   amLODipine   10 mg Oral Daily   artificial tears  1 drop Both Eyes QHS   aspirin  EC  81 mg Oral Daily   atorvastatin   20 mg Oral Daily   Chlorhexidine  Gluconate Cloth  6 each Topical Q0600   clopidogrel   75 mg Oral Daily   doxazosin   2 mg Oral QHS   Influenza vac split trivalent PF  0.5 mL Intramuscular Tomorrow-1000   insulin  aspart  0-6 Units Subcutaneous TID WC   isosorbide -hydrALAZINE   1 tablet Oral TID   pantoprazole   40 mg Oral Daily   sevelamer  carbonate  800 mg Oral TID with meals    Dialysis Orders: MWF NW 4h  B400  77.4kg  2K bath   AVG   Heparin  1500 Last OP HD 9/10 post 77kg Mircera 75 mcg q 2 wks, last 8/18  Home bp meds: Norvasc  10 every day Clonidine  0.3 tid Cardura  2 mg at bedtime Bidil  20-37.5 tid  Assessment/Plan: Acute CVA: per neuro/ pmd. CT head/neck showed no large vessel occlusion or aneurysm within head/neck and multiple areas of intracranial atherosclerosis and stenosis. ECHO completed.  ESRD: on HD MWF. On HD. Resume MWF schedule next week. HTN: bp's stable 140/75 range, home meds prn per pmd.  Volume: Euvolemic on exam. UFG set 1.5L for today.  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 Follow trend. Fe/ESA is not indicated at this time.  Charmaine Piety, NP [facility] 04/27/2024,11:27 AM  LOS: 1 day

## 2024-04-27 NOTE — Evaluation (Signed)
 Occupational Therapy Evaluation Patient Details Name: Austin [name] MRN: [id number] DOB: 12/15/1934 Today's Date: 04/27/2024   History of Present Illness   Pt is a 89 y/o male presenting on 9/4 with visual changes over the last 4 days. MRI reveals acute R PCA infarct and remote R basal ganglia infarct with evidence of prior hemorrhage. PMH: ESRD on HD, diastolic CHF, DM2, HTN, HLD, prostate CA hx     Clinical Impressions PTA patient reports independent with ADLs, using cane for mobility. Admitted for above and presents with problem list below, including L sided visual deficits and questionable inattention, impaired balance, generalized weakness, and impaired cognition.  He currently requires mod assist for bed mobility, mod assist for transfers into standing and looses balance posteriorly when stepping towards HOB, and min to max assist for Adls. He requires cueing to scan towards L side during session with poor carryover of techniques, has difficulty recalling 2 step task and unable to locate items on L side of menu.  He is likely fatigued from dialysis this morning, but needing increased assist compared to PT session yesterday.  RN aware of HR increasing to 150s while EOB. Based on performance today, believe pt will best benefit from continued OT services acutely and after dc at an inpatient setting with >3hrs/day to optimize independence, safety and return to PLOF with ADLs and mobility.     If plan is discharge home, recommend the following:   A lot of help with walking and/or transfers;A lot of help with bathing/dressing/bathroom;Assistance with cooking/housework;Direct supervision/assist for medications management;Direct supervision/assist for financial management;Assist for transportation;Help with stairs or ramp for entrance;Supervision due to cognitive status     Functional Status Assessment   Patient has had a recent decline in their functional status and demonstrates the ability to make  significant improvements in function in a reasonable and predictable amount of time.     Equipment Recommendations   Other (comment) (defer)     Recommendations for Other Services   Rehab consult     Precautions/Restrictions   Precautions Precautions: Fall Recall of Precautions/Restrictions: Intact Restrictions Weight Bearing Restrictions Per Provider Order: No     Mobility Bed Mobility Overal bed mobility: Needs Assistance Bed Mobility: Supine to Sit, Sit to Supine     Supine to sit: Mod assist Sit to supine: Min assist   General bed mobility comments: pt needing assist for trunk and to scoot forward, returned back to bed with guiding support of LEs but needing max asist to scoot up in bed    Transfers Overall transfer level: Needs assistance Equipment used: Straight cane Transfers: Sit to/from Stand Sit to Stand: Mod assist           General transfer comment: mod assist to power up and stedy from EOB.  Posterior LOB and needing mod assist to correct when stepping to [facility] with min assist      Balance Overall balance assessment: Needs assistance Sitting-balance support: No upper extremity supported, Feet supported Sitting balance-Leahy Scale: Fair     Standing balance support: During functional activity, Reliant on assistive device for balance, Single extremity supported Standing balance-Leahy Scale: Poor Standing balance comment: relies on external support, LOB needing mod assist to correct                           ADL either performed or assessed with clinical judgement   ADL Overall ADL's : Needs assistance/impaired     Grooming: Minimal assistance;Sitting  Upper Body Dressing : Minimal assistance;Sitting   Lower Body Dressing: Maximal assistance;Sit to/from stand Lower Body Dressing Details (indicate cue type and reason): assist for socks, mod assist to stand   Toilet Transfer Details (indicate cue type and reason):  stepping towards HOB with min to mod assist         Functional mobility during ADLs: Moderate assistance       Vision Baseline Vision/History: 1 Wears glasses Ability to See in Adequate Light: 0 Adequate Patient Visual Report: Other (comment) (difficulty with vision on L side) Vision Assessment?: Yes Eye Alignment: Within Functional Limits Ocular Range of Motion: Within Functional Limits Alignment/Gaze Preference: Within Defined Limits Tracking/Visual Pursuits: Requires cues, head turns, or add eye shifts to track (head turns with scanning towards L side) Visual Fields: Left visual field deficit Additional Comments: pt initally overshooting with finger to nose testing, requires head turns to locate # of fingers on L side, and is unable to locate items on menu on L side but is able to read from stroke book without difficulty.     Perception Perception: Impaired Preception Impairment Details: Inattention/Neglect Perception-Other Comments: L   Praxis Praxis: Impaired Praxis Impairment Details: Initiation     Pertinent Vitals/Pain Pain Assessment Pain Assessment: No/denies pain     Extremity/Trunk Assessment Upper Extremity Assessment Upper Extremity Assessment: Generalized weakness   Lower Extremity Assessment Lower Extremity Assessment: Defer to PT evaluation   Cervical / Trunk Assessment Cervical / Trunk Assessment: Normal   Communication Communication Communication: No apparent difficulties   Cognition Arousal: Alert Behavior During Therapy: Flat affect Cognition: Cognition impaired     Awareness: Intellectual awareness intact, Online awareness impaired Memory impairment (select all impairments): Working [employer] fast streamer, Short-term memory Attention impairment (select first level of impairment): Sustained attention Executive functioning impairment (select all impairments): Organization, Initiation, Sequencing, Reasoning, Problem solving OT - Cognition Comments: Pt aware  he has had a stroke, but awareness of visual deficits flucutates during session.  Poor carryover of techniques and poor recall of 2 step task.                 Following commands: Impaired Following commands impaired: Follows one step commands with increased time, Follows multi-step commands inconsistently     Cueing  General Comments   Cueing Techniques: Verbal cues;Tactile cues;Visual cues  HR elevates to 150 when standing at EOB, RN notified.  Discussed compensatory techniques with ADLs and safety, needing hands on assist for ADLs/mobility due to weakness/visual deficits.  Pt voiced understanding.  Therapist discussed concern with weakness post HD, pt reports family can assist.   Exercises     Shoulder Instructions      Home Living Family/patient expects to be discharged to:: Private residence Living Arrangements: Spouse/significant other;Other relatives (granddaughter and great grandson) Available Help at Discharge: Family;Available 24 hours/day Type of Home: House Home Access: Stairs to enter [employer] of Steps: 5-6 Entrance Stairs-Rails: Left Home Layout: Two level;1/2 bath on main level;Able to live on main level with bedroom/bathroom Alternate Level Stairs-Number of Steps: flight Alternate Level Stairs-Rails: Right;Left Bathroom Shower/Tub: Chief Strategy Officer: Standard Bathroom Accessibility: Yes   Home Equipment: Rollator (4 wheels);Cane - single Librarian, academic (2 wheels);BSC/3in1          Prior Functioning/Environment Prior Level of Function : Independent/Modified Independent             Mobility Comments: intermittently uses cane: denies falls. Uses scooters at grocery store. Normally walks slower and takes time to stand.  ADLs Comments: Mod I    OT Problem List: Decreased strength;Decreased activity tolerance;Impaired balance (sitting and/or standing);Impaired vision/perception;Decreased cognition;Decreased  coordination;Decreased safety awareness;Decreased knowledge of use of DME or AE;Decreased knowledge of precautions;Impaired UE functional use   OT Treatment/Interventions: Self-care/ADL training;Therapeutic exercise;DME and/or AE instruction;Balance training;Patient/family education;Cognitive remediation/compensation;Visual/perceptual remediation/compensation;Therapeutic activities;Energy conservation      OT Goals(Current goals can be found in the care plan section)   Acute Rehab OT Goals Patient Stated Goal: home OT Goal Formulation: With patient Time For Goal Achievement: 05/11/24 Potential to Achieve Goals: Good   OT Frequency:  Min 2X/week    Co-evaluation              AM-PAC OT 6 Clicks Daily Activity     Outcome Measure Help from another person eating meals?: A Little Help from another person taking care of personal grooming?: A Little Help from another person toileting, which includes using toliet, bedpan, or urinal?: A Lot Help from another person bathing (including washing, rinsing, drying)?: A Lot Help from another person to put on and taking off regular upper body clothing?: A Little Help from another person to put on and taking off regular lower body clothing?: A Lot 6 Click Score: 15   End of Session Equipment Utilized During Treatment: Other (comment) (cane) Nurse Communication: Mobility status;Precautions;Other (comment) (HR)  Activity Tolerance: Patient limited by fatigue Patient left: in bed;with call bell/phone within reach;with bed alarm set;with nursing/sitter in room  OT Visit Diagnosis: Other abnormalities of gait and mobility (R26.89);Muscle weakness (generalized) (M62.81);Other symptoms and signs involving cognitive function;Low vision, both eyes (H54.2)                Time: 8756-8684 OT Time Calculation (min): 32 min Charges:  OT General Charges $OT Visit: 1 Visit OT Evaluation $OT Eval Moderate Complexity: 1 Mod OT Treatments $Self  Care/Home Management : 8-22 mins  Etta NOVAK, OT Acute Rehabilitation Services Office [phone] Secure Chat Preferred    Etta GORMAN Hope 04/27/2024, 2:11 PM

## 2024-04-27 NOTE — Progress Notes (Signed)
 OT Cancellation Note  Patient Details Name: Austin [name] MRN: [id number] DOB: 07-21-1935   Cancelled Treatment:    Reason Eval/Treat Not Completed: Patient at procedure or test/ unavailable- pt in HD. Will follow and see as able.   Etta NOVAK, OT Acute Rehabilitation Services Office [phone] Secure Chat Preferred    Etta GORMAN Hope 04/27/2024, 7:37 AM

## 2024-04-27 NOTE — Procedures (Signed)
 HD Note:  Some information was entered later than the data was gathered due to patient care needs. The stated time with the data is accurate.  Received patient in bed to unit.   Alert and oriented.   Informed consent signed and in chart.   Access used: Left forearm graft Access issues: None  Patient tolerated treatment well.   TX duration: 3.25 hours  Alert, without acute distress.  Total UF removed: 1500 ml  Hand-off given to patient's nurse.   Transported back to the room   Ivone Licht L. Lenon, RN Kidney Dialysis Unit.

## 2024-04-28 ENCOUNTER — Ambulatory Visit: Payer: Self-pay | Admitting: Family Medicine

## 2024-04-28 ENCOUNTER — Other Ambulatory Visit (HOSPITAL_COMMUNITY): Payer: Self-pay

## 2024-04-28 DIAGNOSIS — D631 Anemia in chronic kidney disease: Secondary | ICD-10-CM | POA: Diagnosis not present

## 2024-04-28 DIAGNOSIS — N186 End stage renal disease: Secondary | ICD-10-CM | POA: Diagnosis not present

## 2024-04-28 DIAGNOSIS — Z992 Dependence on renal dialysis: Secondary | ICD-10-CM | POA: Diagnosis not present

## 2024-04-28 DIAGNOSIS — Z23 Encounter for immunization: Secondary | ICD-10-CM | POA: Diagnosis not present

## 2024-04-28 DIAGNOSIS — I120 Hypertensive chronic kidney disease with stage 5 chronic kidney disease or end stage renal disease: Secondary | ICD-10-CM | POA: Diagnosis not present

## 2024-04-28 DIAGNOSIS — H547 Unspecified visual loss: Secondary | ICD-10-CM | POA: Diagnosis present

## 2024-04-28 DIAGNOSIS — I639 Cerebral infarction, unspecified: Secondary | ICD-10-CM | POA: Diagnosis not present

## 2024-04-28 LAB — GLUCOSE, CAPILLARY: Glucose-Capillary: 112 mg/dL — ABNORMAL HIGH (ref 70–99)

## 2024-04-28 MED ORDER — CLONIDINE HCL 0.3 MG PO TABS: 0.3000 mg | ORAL_TABLET | Freq: Every day | ORAL | Status: DC

## 2024-04-28 NOTE — Discharge Planning (Signed)
 [facility] Patient Discharge Orders- [facility] CLINIC: [facility]  Patient's name: Cornie Herrington Admit/DC Dates: 04/25/2024 - 04/28/2024  Discharge Diagnoses: Acute right PCA territory stroke - CT head/neck showed no large vessel occlusion or aneurysm within head/neck and multiple areas of intracranial atherosclerosis and stenosis  Echocardiogram with a EF 55%, no WMA, G1 DD, severely dilated LA, no intracardiac source of embolism. Now on Eliquis   Afib with RVR: new onset, clonidine  resumed at lower dose, on Eliquis , needs f/u with cardiology in outpatient  Aranesp : Given: No    Last Hgb: 10.7 PRBC's Given: No  ESA dose for discharge: Resume mircera 75 mcg IV q 2 weeks  IV Iron  dose at discharge: Continue weekly Venofer   Heparin  change: No  EDW Change: No  Bath Change: No  Access intervention/Change: No  Calcitriol change: No  Discharge Labs: Calcium  9.0  Phosphorus 4.9  Albumin 3.3  K+ 3.8  IV Antibiotics: No  On Coumadin?: No. Plavix  stopped and now on Eliquis    OTHER/APPTS/LAB ORDERS:    D/C Meds to be reconciled by nurse after every discharge.  Completed By: Charmaine Piety, NP   Reviewed by: MD:______ RN_______

## 2024-04-28 NOTE — Progress Notes (Signed)
 [facility] Progress Note   Subjective:    Seen and examined patient at bedside. Tolerated yesterday's HD with net UF 1.5L. He denies any acute complaints. Noted discharge order for today.   Objective Vitals:   04/28/24 0400 04/28/24 0409 04/28/24 0600 04/28/24 0926  BP:    (!) 134/57  Pulse:      Resp: 16  19 15   Temp:    99.9 F (37.7 C)  TempSrc:    Oral  SpO2:    98%  Weight:  78 kg    Height:       Physical Exam General: Awake, alert, on RA, NAD Heart: S1 and S2; No murmurs, gallops, or rubs Lungs: Clear anteriorly Abdomen:Soft and non-tender Extremities: Trace LE edema Dialysis Access: AVG   Filed Weights   04/26/24 0000 04/27/24 0733 04/28/24 0409  Weight: 76.1 kg 78.2 kg 78 kg    Intake/Output Summary (Last 24 hours) at 04/28/2024 1240 Last data filed at 04/28/2024 1000 Gross per 24 hour  Intake 900 ml  Output 0 ml  Net 900 ml    Additional Objective Labs: Basic Metabolic Panel: Recent Labs  Lab 04/25/24 2035 04/25/24 2036 04/27/24 0754  NA 139 138 133*  K 3.7 3.7 3.8  CL 95* 99 92*  CO2 24  --  20*  GLUCOSE 153* 153* 130*  BUN 44* 47* 70*  CREATININE 8.24* 8.20* 11.48*  CALCIUM  9.8  --  9.0  PHOS  --   --  4.9*   Liver Function Tests: Recent Labs  Lab 04/25/24 2035 04/27/24 0754  AST 13*  --   ALT 8  --   ALKPHOS 93  --   BILITOT 1.1  --   PROT 8.5*  --   ALBUMIN 3.8 3.3*   No results for input(s): LIPASE, AMYLASE in the last 168 hours. CBC: Recent Labs  Lab 04/25/24 2035 04/25/24 2036 04/27/24 0753  WBC 10.1  --  8.8  NEUTROABS 7.7  --   --   HGB 12.1* 13.9 10.7*  HCT 37.8* 41.0 32.9*  MCV 90.9  --  87.0  PLT 433*  --  417*   Blood Culture    Component Value Date/Time   SDES BLOOD RIGHT HAND 10/25/2022 0615   SPECREQUEST  10/25/2022 0615    BOTTLES DRAWN AEROBIC AND ANAEROBIC Blood Culture results may not be optimal due to an inadequate volume of blood received in culture bottles   CULT  10/25/2022  0615    NO GROWTH 5 DAYS Performed at [facility], [address], [place], [place] [address]    REPTSTATUS 10/30/2022 FINAL 10/25/2022 0615    Cardiac Enzymes: No results for input(s): CKTOTAL, CKMB, CKMBINDEX, TROPONINI in the last 168 hours. CBG: Recent Labs  Lab 04/27/24 0636 04/27/24 1240 04/27/24 1631 04/27/24 2120 04/28/24 0615  GLUCAP 133* 100* 172* 163* 112*   Iron  Studies: No results for input(s): IRON , TIBC, TRANSFERRIN, FERRITIN in the last 72 hours. Lab Results  Component Value Date   INR 1.0 04/25/2024   INR 1.2 10/25/2022   INR 1.11 11/04/2016   Studies/Results: CT ANGIO HEAD NECK W WO CM Result Date: 04/26/2024 EXAM: CTA HEAD AND NECK WITH AND WITHOUT 04/26/2024 02:04:00 PM TECHNIQUE: CTA of the head and neck was performed with and without the administration of intravenous contrast. Multiplanar 2D and/or 3D reformatted images are provided for review. Automated exposure control, iterative reconstruction, and/or weight based adjustment of the mA/kV was utilized to reduce the  radiation dose to as low as reasonably achievable. Stenosis of the internal carotid arteries measured using NASCET criteria. COMPARISON: MRI head 04/25/2024 and MRA head 12/12/2006 CLINICAL HISTORY: Stroke/TIA, determine embolic source. Loss of Vision. Pt has hiccup X 3 hours. Hiccups caused motion on scans. Best images possible at this time. FINDINGS: CTA NECK: AORTIC ARCH AND ARCH VESSELS: Mild-to-moderate mucosal thickening of the visualized aortic arch. Common version of the brachiocephalic and left common carotid arteries. Atherosclerosis of the subclavian arteries without high-grade stenosis. CERVICAL CAROTID ARTERIES: Tortuosity of the proximal common carotid artery on the right. Mild atherosclerosis of the right carotid bifurcation and proximal right cervical ICA without hemodynamically significant stenosis. Tortuosity of the distal right cervical ICA. Mild atherosclerosis  at the left common carotid artery origin. Mild atherosclerosis at the left carotid bifurcation without hemodynamically significant stenosis. CERVICAL VERTEBRAL ARTERIES: The vertebral arteries are patent from the origins to the vertebrobasilar confluence. Atherosclerosis along the right V1 segment resulting in mild-to-moderate stenosis. Additional atherosclerosis of the right V3 and right V4 segments resulting in mild stenosis. Atherosclerosis at the left vertebral artery origin results in moderate stenosis. Atherosclerosis of the left V4 segment results in mild-to-moderate stenosis. There is distal tapering of the left V4 segment. LUNGS AND MEDIASTINUM: Partially visualized right upper lobe nodule which measures up to 1.4 cm seen on series 14 image 1, similar in appearance to the CT chest from 2024. Partially visualized pleural effusion. SOFT TISSUES: There is mild asymmetric prominence of the left palatine tonsil. Prominence of the thyroid  with multiple nodules, the largest in the upper right thyroid  lobe measuring up to 2.0 cm. Lipoma in the subcutaneous tissues of the left posterolateral neck. BONES: Degenerative changes in the visualized spine. CTA HEAD: ANTERIOR CIRCULATION: The intracranial internal carotid arteries are patent bilaterally. Atherosclerosis of the bilateral carotid siphons. There is moderate stenosis of the right cavernous ICA. Additional moderate stenosis along the supraclinoid right ICA. There is moderate-to-severe stenosis of the left supraclinoid ICA. The middle cerebral arteries are patent bilaterally. There is mild stenosis of the proximal M1 segment right MCA. Mild atherosclerotic irregularity of multiple bilateral MCA branches. There is moderate stenosis at the origin of the A1 segment of the right ACA. The ACAs are patent bilaterally. There is moderate stenosis of the proximal A4 segment of the right ACA. POSTERIOR CIRCULATION: Mild irregularity of the basilar artery. The P1 segment of  the right PCA is patent. There is severe narrowing of the proximal P2 segment of the right PCA. Additional moderate stenosis of the P3 segment right PCA. The left PCA is patent without high-grade stenosis. The superior cerebellar arteries are patent bilaterally. OTHER: No dural venous sinus thrombosis on this non-dedicated study. Nonspecific hypoattenuation in the periventricular and subcortical white matter, most likely representing chronic small vessel disease. Remote lacunar infarct in the right basal ganglia. Generalized parenchymal volume loss. Bilateral lens replacement. Complete opacification of the right maxillary sinus with areas of inspissated secretions versus fungal colonization. Additional mucosal thickening in the right anterior ethmoid air cells and right frontal sinus. Mucosal thickening in the alveolar recess of the left maxillary sinus. IMPRESSION: 1. No large vessel occlusion or aneurysm in the head or neck. 2. Multiple areas of intracranial atherosclerosis and stenosis. Severe stenosis of the proximal P2 segment of the right PCA. Moderate stenosis of the right cavernous and supraclinoid ICA and moderate-to-severe stenosis of the left supraclinoid ICA. 3. Mild-to-moderate atherosclerosis and stenosis of the cervical vertebral arteries, including moderate stenosis at the left vertebral  artery origin and mild-to-moderate stenosis of the left V4 segment with distal tapering. 4. Partially visualized 1.4cm nodule in the right upper lobe, similar to CT from 2024. 5. Asymmetric prominence of the left palatine tonsil. Recommend correlation with direct visualization. 6. Thyroid  nodules measuring up to 2.0 cm. Recommend nonemergent correlation with thyroid  ultrasound. Electronically signed by: Donnice Mania MD 04/26/2024 03:00 PM EDT RP Workstation: [id number]    Medications:   amLODipine   10 mg Oral Daily   apixaban   2.5 mg Oral BID   artificial tears  1 drop Both Eyes QHS   atorvastatin   20 mg Oral  Daily   cloNIDine   0.3 mg Oral Daily   doxazosin   2 mg Oral QHS   insulin  aspart  0-6 Units Subcutaneous TID WC   isosorbide -hydrALAZINE   1 tablet Oral TID   pantoprazole   40 mg Oral Daily   sevelamer  carbonate  800 mg Oral TID with meals    Dialysis Orders: MWF NW 4h  B400  77.4kg  2K bath   AVG   Heparin  1500 Last OP HD 9/10 post 77kg Mircera 75 mcg q 2 wks, last 8/18   Home bp meds: Norvasc  10 every day Clonidine  0.3 tid Cardura  2 mg at bedtime Bidil  20-37.5 tid  Assessment/Plan: Acute CVA: per neuro/ pmd. CT head/neck showed no large vessel occlusion or aneurysm within head/neck and multiple areas of intracranial atherosclerosis and stenosis. ECHO completed. Now on Eliquis  2.5mg  BID ESRD: on HD MWF. Resume MWF schedule next week in outpatient. HTN: bp's stable 140/75 range, home meds prn per pmd.  Volume: Euvolemic on exam.  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 Follow trend. Fe/ESA is not indicated at this time. Dispo: Okay for discharge from a renal standpoint   Charmaine Piety, NP [facility] 04/28/2024,12:40 PM  LOS: 1 day

## 2024-04-29 ENCOUNTER — Telehealth (HOSPITAL_COMMUNITY): Payer: Self-pay | Admitting: Nephrology

## 2024-04-29 DIAGNOSIS — N2581 Secondary hyperparathyroidism of renal origin: Secondary | ICD-10-CM | POA: Diagnosis not present

## 2024-04-29 DIAGNOSIS — N186 End stage renal disease: Secondary | ICD-10-CM | POA: Diagnosis not present

## 2024-04-29 DIAGNOSIS — Z992 Dependence on renal dialysis: Secondary | ICD-10-CM | POA: Diagnosis not present

## 2024-04-29 NOTE — Telephone Encounter (Signed)
 Transition of Care - Initial Contact after Hospitalization  Date of discharge: 04/28/2024 Date of contact: 04/29/24  Method: Phone Spoke to: Patient's family  Patient contacted to discuss transition of care from recent inpatient hospitalization. Patient was admitted to [facility] from 9/4-04/28/2024 with Dx A-fib and embolic CVA.  The discharge medication list was reviewed. She understands the changes and has no concerns.   Pt at dialysis now - no issues getting there today.  No other concerns at this time.  Izetta Boehringer, PA-C [employer] Pager [phone]

## 2024-04-30 LAB — HEPATITIS B SURFACE ANTIBODY, QUANTITATIVE: Hep B S AB Quant (Post): 247 m[IU]/mL

## 2024-05-01 DIAGNOSIS — I63331 Cerebral infarction due to thrombosis of right posterior cerebral artery: Secondary | ICD-10-CM | POA: Diagnosis not present

## 2024-05-01 DIAGNOSIS — I4891 Unspecified atrial fibrillation: Secondary | ICD-10-CM | POA: Diagnosis not present

## 2024-05-01 DIAGNOSIS — N2581 Secondary hyperparathyroidism of renal origin: Secondary | ICD-10-CM | POA: Diagnosis not present

## 2024-05-01 DIAGNOSIS — Z992 Dependence on renal dialysis: Secondary | ICD-10-CM | POA: Diagnosis not present

## 2024-05-01 DIAGNOSIS — N186 End stage renal disease: Secondary | ICD-10-CM | POA: Diagnosis not present

## 2024-05-02 ENCOUNTER — Ambulatory Visit: Admitting: Adult Health

## 2024-05-02 ENCOUNTER — Ambulatory Visit: Attending: Cardiology | Admitting: Cardiology

## 2024-05-02 ENCOUNTER — Encounter: Payer: Self-pay | Admitting: Cardiology

## 2024-05-02 VITALS — BP 140/70 | HR 90 | Temp 98.7°F | Ht 67.0 in | Wt 173.0 lb

## 2024-05-02 VITALS — BP 133/52 | HR 97 | Resp 16 | Ht 67.0 in | Wt 172.4 lb

## 2024-05-02 DIAGNOSIS — N186 End stage renal disease: Secondary | ICD-10-CM

## 2024-05-02 DIAGNOSIS — E785 Hyperlipidemia, unspecified: Secondary | ICD-10-CM

## 2024-05-02 DIAGNOSIS — I69398 Other sequelae of cerebral infarction: Secondary | ICD-10-CM

## 2024-05-02 DIAGNOSIS — I6523 Occlusion and stenosis of bilateral carotid arteries: Secondary | ICD-10-CM

## 2024-05-02 DIAGNOSIS — E1122 Type 2 diabetes mellitus with diabetic chronic kidney disease: Secondary | ICD-10-CM

## 2024-05-02 DIAGNOSIS — I639 Cerebral infarction, unspecified: Secondary | ICD-10-CM

## 2024-05-02 DIAGNOSIS — Z992 Dependence on renal dialysis: Secondary | ICD-10-CM

## 2024-05-02 DIAGNOSIS — R2681 Unsteadiness on feet: Secondary | ICD-10-CM

## 2024-05-02 DIAGNOSIS — H5461 Unqualified visual loss, right eye, normal vision left eye: Secondary | ICD-10-CM | POA: Diagnosis not present

## 2024-05-02 DIAGNOSIS — I4891 Unspecified atrial fibrillation: Secondary | ICD-10-CM

## 2024-05-02 DIAGNOSIS — I10 Essential (primary) hypertension: Secondary | ICD-10-CM

## 2024-05-02 DIAGNOSIS — Z7901 Long term (current) use of anticoagulants: Secondary | ICD-10-CM

## 2024-05-02 DIAGNOSIS — Z8673 Personal history of transient ischemic attack (TIA), and cerebral infarction without residual deficits: Secondary | ICD-10-CM | POA: Diagnosis not present

## 2024-05-02 DIAGNOSIS — E1169 Type 2 diabetes mellitus with other specified complication: Secondary | ICD-10-CM

## 2024-05-02 DIAGNOSIS — E782 Mixed hyperlipidemia: Secondary | ICD-10-CM | POA: Diagnosis not present

## 2024-05-02 DIAGNOSIS — I480 Paroxysmal atrial fibrillation: Secondary | ICD-10-CM | POA: Diagnosis not present

## 2024-05-02 MED ORDER — CLONIDINE HCL 0.2 MG PO TABS: 0.2000 mg | ORAL_TABLET | Freq: Every day | ORAL | 6 refills | Status: AC

## 2024-05-02 NOTE — Progress Notes (Signed)
 Subjective:    Patient ID: Austin [name], male    DOB: Jan 17, 1935, 89 y.o.   MRN: [id number]  HPI 89 year old male who  has a past medical history of Acute diastolic CHF (congestive heart failure) (HCC) (09/17/2020), Acute exacerbation of CHF (congestive heart failure) (HCC) (08/04/2019), Acute right PCA stroke (HCC) (04/25/2024), ANEMIA DUE TO CHRONIC BLOOD LOSS (03/13/2007), CAROTID ARTERY STENOSIS (05/10/2010), Coronary artery disease, DIABETES MELLITUS, TYPE II (09/19/2007), DISEASE, CEREBROVASCULAR NEC (03/05/2007), ESRD (end stage renal disease) on dialysis [facility]), GERD (03/13/2007), HYPERLIPIDEMIA (03/05/2007), HYPERTENSION (03/05/2007), HYPOKALEMIA (11/09/2009), KNEE PAIN, RIGHT (11/09/2009), New onset a-fib (HCC) (04/27/2024), and PROSTATE CANCER, HX OF (03/05/2007).  He presents to the office for TCM visit  Admit Date 04/25/2024 Discharge Date 04/28/2024  On the day of presentation to the emergency was seen at his eye doctor with a complaint of progressive visionh vision impairment in both eyes for four days.He was noted to have left homonymous hemianopsia and sent to the ED for stroke workup.   Hospital Course  Acute Right PCA territory stroke  - CT of the head did not show any acute intracranial abnormality, showed moderate chronic small vessel ischemic disease.  MRI of the brain showed an acute infarct in the right PCA territory, remote infarct in the right basal ganglia with evidence of prior hemorrhage, and additional remote lacunar infarct in the left thalamus.  CTA angio head and neck showed no large vessel occlusion or aneurysm in the head or neck.  Multiple areas of intracranial atherosclerosis and stenosis.  Severe stenosis in the proximal P2 segment of the right PCA.  Moderate stenosis of the right cavernous and supraclinoid ICA and moderate to severe stenosis of the left supraclinoid ICA.  He was also found to have mild to moderate atherosclerosis and stenosis of the cervical  vertebral arteries including moderate stenosis of the left vertebral artery origin and mild to moderate stenosis of the left V4 segment with distal tapering. - Echocardiogram showed an EF of 55%, no WMA, G1 DD, severely dilated LA, no intracardiac source of embolism. - He was seen by neurology.  Prior to admission he was on Plavix  with recommendation of DAPT for 3 months.  He was found to be in new onset A-fib DAPT was changed to Eliquis .  He was renally dosed at 2.5 mg twice a day  Afib with RVR  - On 04/27/2024 after dialysis, while working with occupational therapy patient became tachycardic to 150.  EKG showed A-fib.  He did not have any symptoms of palpitation, shortness of breath, chest pain with this episode.  He did not have a prior history of A-fib but has a history of stroke/TIA in the past.  His clonidine  was resumed at a lower dose.  He did convert to normal sinus rhythm later that evening and remained in sinus rhythm since then. - He was referred to cardiology as outpatient  ESRD on HD  - M/W/F   Essential Hypertension  - Continue with Cardura  2 mg nightly, clonidine  0.3 mg daily, isosorbide  20 mg 3 times daily, hydralazine  37.5 mg 3 times daily, and amlodipine  10 mg daily.  Type 2 DM - A1c 7.6 in July 2025 - Was placed on SSI   HLD - Continue with Lipitor 20mg   Today he presents to the office today with his wife.  Patient reports that he is doing well overall feels well.  He feels as though his strength is at baseline.  He has not noticed any deficit except  for continued vision loss left side peripheral edema.  He also feels fatigued since being out of the hospital.  Have a follow-up appointment scheduled with cardiology later on today and his neurology appointment is also scheduled for next month.  His wife reports that she has noticed that he is walking slower ( he is using a cane but did not bring it today)  and has a shuffled gait and that he seems more confused at  times.  He denies chest pain, shortness of breath, headaches or neurodeficits.   Review of Systems  Constitutional:  Positive for fatigue.  HENT: Negative.    Eyes:  Positive for visual disturbance.  Respiratory: Negative.    Cardiovascular: Negative.   Gastrointestinal: Negative.   Endocrine: Negative.   Genitourinary: Negative.   Musculoskeletal:  Positive for gait problem.  Skin: Negative.   Allergic/Immunologic: Negative.   Hematological: Negative.   Psychiatric/Behavioral: Negative.    All other systems reviewed and are negative.  Past Medical History:  Diagnosis Date   Acute diastolic CHF (congestive heart failure) (HCC) 09/17/2020   Acute exacerbation of CHF (congestive heart failure) (HCC) 08/04/2019   Acute right PCA stroke (HCC) 04/25/2024   ANEMIA DUE TO CHRONIC BLOOD LOSS 03/13/2007   CAROTID ARTERY STENOSIS 05/10/2010   Coronary artery disease    DIABETES MELLITUS, TYPE II 09/19/2007   DISEASE, CEREBROVASCULAR NEC 03/05/2007   ESRD (end stage renal disease) on dialysis (HCC)    GERD 03/13/2007   HYPERLIPIDEMIA 03/05/2007   HYPERTENSION 03/05/2007   HYPOKALEMIA 11/09/2009   KNEE PAIN, RIGHT 11/09/2009   New onset a-fib (HCC) 04/27/2024   Eliquis    PROSTATE CANCER, HX OF 03/05/2007    Social History   Socioeconomic History   Marital status: Married    Spouse name: Not on file   Number of children: Not on file   Years of education: Not on file   Highest education level: Not on file  Occupational History   Not on file  Tobacco Use   Smoking status: Former    Current packs/day: 0.00    Average packs/day: 1 pack/day for 10.0 years (10.0 ttl pk-yrs)    Types: Cigarettes    Start date: 08/22/1964    Quit date: 08/22/1974    Years since quitting: 49.7   Smokeless tobacco: Never  Vaping Use   Vaping status: Never Used  Substance and Sexual Activity   Alcohol  use: No    Alcohol /week: 0.0 standard drinks of alcohol    Drug use: No   Sexual activity: Not  Currently  Other Topics Concern   Not on file  Social History Narrative   Retired - Administrator, arts    Married 53 years       He enjoys traveling    Social Drivers of Corporate investment banker Strain: Low Risk  (02/09/2023)   Overall Financial Resource Strain (CARDIA)    Difficulty of Paying Living Expenses: Not very hard  Food Insecurity: No Food Insecurity (04/26/2024)   Hunger Vital Sign    Worried About Running Out of Food in the Last Year: Never true    Ran Out of Food in the Last Year: Never true  Transportation Needs: No Transportation Needs (04/26/2024)   PRAPARE - Administrator, Civil Service (Medical): No    Lack of Transportation (Non-Medical): No  Physical Activity: Insufficiently Active (02/09/2023)   Exercise Vital Sign    Days of Exercise per Week: 3 days    Minutes of Exercise  per Session: 30 min  Stress: No Stress Concern Present (02/09/2023)   [employer] of Occupational Health - Occupational Stress Questionnaire    Feeling of Stress : Not at all  Social Connections: Unknown (04/26/2024)   Social Connection and Isolation Panel    Frequency of Communication with Friends and Family: Patient declined    Frequency of Social Gatherings with Friends and Family: Patient declined    Attends Religious Services: Patient declined    Active Member of Clubs or Organizations: Not on file    Attends Banker Meetings: Patient declined    Marital Status: Married  Catering manager Violence: Not At Risk (04/26/2024)   Humiliation, Afraid, Rape, and Kick questionnaire    Fear of Current or Ex-Partner: No    Emotionally Abused: No    Physically Abused: No    Sexually Abused: No    Past Surgical History:  Procedure Laterality Date   A/V FISTULAGRAM N/A 03/21/2024   Procedure: A/V Fistulagram;  Surgeon: Magda Debby SAILOR, MD;  Location: [facility];  Service: Cardiovascular;  Laterality: N/A;   AV FISTULA PLACEMENT Left 10/18/2022   Procedure: INSERTION  OF LEFT ARM BRACHIAL ARTERY TO AXILLARY VEIN ARTERIOVENOUS (AV) GORE-TEX GRAFT;  Surgeon: Lanis Fonda BRAVO, MD;  Location[facility];  Service: Vascular;  Laterality: Left;   CAROTID ARTERY ANGIOPLASTY Right Oct. 10, 2001   ESOPHAGOGASTRODUODENOSCOPY (EGD) WITH PROPOFOL  N/A 11/04/2016   Procedure: ESOPHAGOGASTRODUODENOSCOPY (EGD) WITH PROPOFOL ;  Surgeon: Layla Lah, MD;  Location: [facility];  Service: Gastroenterology;  Laterality: N/A;   IR FLUORO GUIDE CV LINE RIGHT  10/25/2022   IR FLUORO GUIDE CV LINE RIGHT  10/27/2022   IR US  GUIDE VASC ACCESS RIGHT  10/25/2022   IR US  GUIDE VASC ACCESS RIGHT  10/27/2022   LAPAROSCOPIC APPENDECTOMY  02/20/2012   Procedure: APPENDECTOMY LAPAROSCOPIC;  Surgeon: Jina Nephew, MD;  Location: [facility] OR;  Service: General;  Laterality: N/A;   PROSTATE SURGERY     prostatectomy    Family History  Problem Relation Age of Onset   Hypertension Mother    Cancer Father        Mesothelioma    Stomach cancer Brother    Cancer Brother    Cancer - Cervical Brother    Cancer Brother    Diabetes Brother    Esophageal cancer Neg Hx    Colon cancer Neg Hx    Pancreatic cancer Neg Hx     Allergies  Allergen Reactions   Aspirin  Other (See Comments)    High doses causes stomach ulcer and bleeding    Current Outpatient Medications on File Prior to Visit  Medication Sig Dispense Refill   Accu-Chek Softclix Lancets lancets USED TO CHECK BLOOD GLUCOSE TWICE A DAY OR AS NEEDED 100 each 6   acetaminophen  (TYLENOL ) 500 MG tablet Take 1,000 mg by mouth every 4 (four) hours as needed.     albuterol  (VENTOLIN  HFA) 108 (90 Base) MCG/ACT inhaler TAKE 2 PUFFS BY MOUTH EVERY 6 HOURS AS NEEDED FOR WHEEZE OR SHORTNESS OF BREATH 8.5 each 5   amLODipine  (NORVASC ) 10 MG tablet TAKE 1 TABLET BY MOUTH EVERY DAY 90 tablet 3   apixaban  (ELIQUIS ) 2.5 MG TABS tablet Take 1 tablet (2.5 mg total) by mouth 2 (two) times daily. 60 tablet 11   atorvastatin  (LIPITOR) 20 MG tablet TAKE 1 TABLET BY  MOUTH EVERY DAY 90 tablet 3   cetirizine  (ZYRTEC ) 5 MG tablet Take 1 tablet (5 mg total) by mouth at  bedtime. 60 tablet 0   cholecalciferol  (VITAMIN D3) 25 MCG (1000 UNIT) tablet Take 1,000 Units by mouth daily.     cloNIDine  (CATAPRES ) 0.3 MG tablet Take 1 tablet (0.3 mg total) by mouth daily.     doxazosin  (CARDURA ) 2 MG tablet Take 2 mg by mouth at bedtime.   3   esomeprazole  (NEXIUM ) 40 MG capsule TAKE 1 CAPSULE BY MOUTH EVERY DAY 90 capsule 3   fluticasone  (FLONASE ) 50 MCG/ACT nasal spray Place 1 spray into both nostrils daily. 9.9 mL 2   glucose blood (ACCU-CHEK GUIDE) test strip USE TO CHECK BLOOD GLUCOSE TWICE A DAY OR AS NEEDED 300 strip 1   guaiFENesin  (MUCINEX ) 600 MG 12 hr tablet Take 600 mg by mouth at bedtime.     isosorbide -hydrALAZINE  (BIDIL ) 20-37.5 MG tablet Take 1 tablet by mouth 3 (three) times daily. 30 tablet 0   ketoconazole  (NIZORAL ) 2 % cream Apply 1 Application topically 2 (two) times daily. 60 g 2   lactulose  (CHRONULAC ) 10 GM/15ML solution TAKE 30 ML BY MOUTH TWICE DAILY AS NEEDED 237 mL 3   Melatonin 10 MG TABS Take 10 mg by mouth at bedtime.     Methoxy PEG-Epoetin  Beta (MIRCERA IJ) Mircera     methylcellulose (CITRUCEL) oral powder 1 tablespoon every 2-3 days,alternating with lactulose      Multiple Vitamins-Minerals (PRESERVISION AREDS 2) CAPS Take 1 capsule by mouth daily.     Propylene Glycol (SYSTANE BALANCE OP) Place 1 drop into both eyes at bedtime.     sevelamer  carbonate (RENVELA ) 800 MG tablet Take 800 mg by mouth 3 (three) times daily.     No current facility-administered medications on file prior to visit.    BP (!) 140/70   Pulse 90   Temp 98.7 F (37.1 C) (Oral)   Ht 5' 7 (1.702 m)   Wt 173 lb (78.5 kg)   SpO2 96%   BMI 27.10 kg/m       Objective:   Physical Exam Vitals and nursing note reviewed.  Constitutional:      Appearance: Normal appearance. He is obese.  Eyes:     Extraocular Movements: Extraocular movements intact.      Conjunctiva/sclera: Conjunctivae normal.     Pupils: Pupils are equal, round, and reactive to light.     Comments: Left-sided peripheral vision loss  Cardiovascular:     Rate and Rhythm: Normal rate and regular rhythm.     Pulses: Normal pulses.     Heart sounds: Normal heart sounds.  Pulmonary:     Effort: Pulmonary effort is normal.     Breath sounds: Normal breath sounds.  Skin:    General: Skin is warm and dry.  Neurological:     General: No focal deficit present.     Mental Status: He is alert and oriented to person, place, and time.     Gait: Gait abnormal (shuffling gait).  Psychiatric:        Mood and Affect: Mood normal.        Behavior: Behavior normal.        Thought Content: Thought content normal.        Judgment: Judgment normal.       Assessment & Plan:  1. Acute CVA (cerebrovascular accident) [facility]) (Primary) - Hospital notes, discharge instructions, labs, imaging, medication changes were all reviewed with the patient and his wife.  All questions answered to the best of my ability.   - He was advised to continue with Eliquis   2.5 mg twice daily  - Follow-up with neurology as directed.  We will refer him to physical therapy - Ambulatory referral to Physical Therapy  2. New onset atrial fibrillation (HCC) -Sinus rhythm with in the office today.  Will continue with current medication until seen by cardiology later on this afternoon.  3. ESRD on dialysis [facility]) -Continue with dialysis Monday Wednesday and Fridays  4. Essential hypertension -Slightly elevated in the office today.  Will continue to monitor  5. Type 2 diabetes mellitus with chronic kidney disease on chronic dialysis, without long-term current use of insulin  (HCC) -Continue to monitor her.  6. Hyperlipidemia associated with type 2 diabetes mellitus (HCC) -Continue statin.  7. Gait instability  - Ambulatory referral to Physical Therapy   Niti Leisure, NP

## 2024-05-02 NOTE — Patient Instructions (Signed)
 Medication Instructions:  Your physician has recommended you make the following change in your medication:  DECREASE Clonidine  to 0.2 mg daily  *If you need a refill on your cardiac medications before your next appointment, please call your pharmacy*  Lab Work: Your physician recommends that you return for lab work in: 6 months (before your 6 month follow up)  If you have any lab test that is abnormal or we need to change your treatment, we will call you to review the results.  Testing/Procedures: None ordered  Follow-Up: At [facility], you and your health needs are our priority.  As part of our continuing mission to provide you with exceptional heart care, our providers are all part of one team.  This team includes your primary Cardiologist (physician) and Advanced Practice Providers or APPs (Physician Assistants and Nurse Practitioners) who all work together to provide you with the care you need, when you need it.  Your next appointment:   6 month(s)  Provider:   One of our Advanced Practice Providers (APPs): Morse Clause, PA-C  Lamarr Satterfield, NP Miriam Shams, NP  Olivia Pavy, PA-C Josefa Beauvais, NP  Leontine Salen, PA-C Orren Fabry, PA-C  Culp, PA-C Ernest Dick, NP  Damien Braver, NP Jon Hails, PA-C  Waddell Donath, PA-C    Dayna Dunn, PA-C  Scott Weaver, PA-C Lum Louis, NP Katlyn West, NP Callie Goodrich, PA-C  Evan Williams, PA-C Sheng Haley, PA-C  Xika Zhao, NP Kathleen Johnson, PA-C   Then, Sunit Tolia, DO will plan to see you again in 1 year(s).     Thank you for choosing [facility]!!   Maeola Domino, RN [phone]

## 2024-05-02 NOTE — Patient Instructions (Signed)
 It was great seeing you today.   I am going to refer you to PT   Follow up with Cardiology and Neurology outpatient

## 2024-05-02 NOTE — Progress Notes (Signed)
 Cardiology Office Note:    Date:  05/02/2024  NAME:  Austin [name]    MRN: [id number] DOB:  12/12/1934   PCP:  Merna Huxley, NP  Former Cardiology Providers: Dr. Levern Primary Cardiologist:  Madonna Large, DO, [facility] (established care 05/02/2024) Electrophysiologist:  None   Referring MD: Merna Huxley, NP  Reason of Consult: Atrial fibrillation  Chief Complaint  Patient presents with   Atrial Fibrillation   New Patient (Initial Visit)    History of Present Illness:    Austin [name] is a 89 y.o. African-American male whose past medical history and cardiovascular risk factors includes: Acute right PCA territory stroke, paroxysmal atrial fibrillation, ESRD on hemodialysis, hypertension, diabetes mellitus type 2, hyperlipidemia, carotid artery disease, history of prostate cancer, former smoker. He is being seen today for the evaluation of atrial fibrillation at the request of Merna Huxley, NP.  Patient was referred to the practice for evaluation and management of new onset of atrial fibrillation diagnosed during his recent hospitalization in September 2025.  On April 25, 2024 patient was at his eye doctors appointment and he complained of progressive vision loss with impairment in both eyes for the last 4 days.  He was noted to have left homonymous hemianopsia and was sent to ED for further evaluation and management.  MRI of the brain showed acute infarct of the right PCA territory and remote infarcts in the right basal ganglia with evidence of prior hemorrhage as well as remote lacunar infarct in the left thalamus.  On the day of discharge patient was working with occupational therapy he was noted to be tachycardic.  EKG was performed which illustrated atrial fibrillation.  Following morning patient was noted to have spontaneously converted to sinus rhythm.  His dual antiplatelet therapy was transition to anticoagulation after speaking to neurology per discharge summary.  Patient  presents today to establish care.  Patient denies anginal chest pain or heart failure symptoms. No history of intracranial bleeding. History of melanotic stools in 2018 and underwent endoscopy, results reviewed in epic. Tolerating Eliquis  2.5 mg p.o. twice daily.  Current Medications: Current Meds  Medication Sig   Accu-Chek Softclix Lancets lancets USED TO CHECK BLOOD GLUCOSE TWICE A DAY OR AS NEEDED   acetaminophen  (TYLENOL ) 500 MG tablet Take 1,000 mg by mouth every 4 (four) hours as needed.   albuterol  (VENTOLIN  HFA) 108 (90 Base) MCG/ACT inhaler TAKE 2 PUFFS BY MOUTH EVERY 6 HOURS AS NEEDED FOR WHEEZE OR SHORTNESS OF BREATH   amLODipine  (NORVASC ) 10 MG tablet TAKE 1 TABLET BY MOUTH EVERY DAY   apixaban  (ELIQUIS ) 2.5 MG TABS tablet Take 1 tablet (2.5 mg total) by mouth 2 (two) times daily.   aspirin  EC 81 MG tablet Take 81 mg by mouth daily. Swallow whole.   atorvastatin  (LIPITOR) 20 MG tablet TAKE 1 TABLET BY MOUTH EVERY DAY   cetirizine  (ZYRTEC ) 5 MG tablet Take 1 tablet (5 mg total) by mouth at bedtime.   cholecalciferol  (VITAMIN D3) 25 MCG (1000 UNIT) tablet Take 1,000 Units by mouth daily.   cloNIDine  (CATAPRES ) 0.2 MG tablet Take 1 tablet (0.2 mg total) by mouth daily.   doxazosin  (CARDURA ) 2 MG tablet Take 2 mg by mouth at bedtime.    esomeprazole  (NEXIUM ) 40 MG capsule TAKE 1 CAPSULE BY MOUTH EVERY DAY   fluticasone  (FLONASE ) 50 MCG/ACT nasal spray Place 1 spray into both nostrils daily.   glucose blood (ACCU-CHEK GUIDE) test strip USE TO CHECK BLOOD GLUCOSE TWICE A DAY OR AS  NEEDED   guaiFENesin  (MUCINEX ) 600 MG 12 hr tablet Take 600 mg by mouth at bedtime.   isosorbide -hydrALAZINE  (BIDIL ) 20-37.5 MG tablet Take 1 tablet by mouth 3 (three) times daily.   ketoconazole  (NIZORAL ) 2 % cream Apply 1 Application topically 2 (two) times daily.   lactulose  (CHRONULAC ) 10 GM/15ML solution TAKE 30 ML BY MOUTH TWICE DAILY AS NEEDED   Melatonin 10 MG TABS Take 10 mg by mouth at bedtime.    Methoxy PEG-Epoetin  Beta (MIRCERA IJ) Mircera   methylcellulose (CITRUCEL) oral powder 1 tablespoon every 2-3 days,alternating with lactulose    Multiple Vitamins-Minerals (PRESERVISION AREDS 2) CAPS Take 1 capsule by mouth daily.   Propylene Glycol (SYSTANE BALANCE OP) Place 1 drop into both eyes at bedtime.   sevelamer  carbonate (RENVELA ) 800 MG tablet Take 800 mg by mouth 3 (three) times daily.   [DISCONTINUED] cloNIDine  (CATAPRES ) 0.3 MG tablet Take 1 tablet (0.3 mg total) by mouth daily.     Allergies:    Aspirin    Past Medical History: Past Medical History:  Diagnosis Date   Acute diastolic CHF (congestive heart failure) (HCC) 09/17/2020   Acute exacerbation of CHF (congestive heart failure) (HCC) 08/04/2019   Acute right PCA stroke (HCC) 04/25/2024   ANEMIA DUE TO CHRONIC BLOOD LOSS 03/13/2007   CAROTID ARTERY STENOSIS 05/10/2010   Coronary artery disease    DIABETES MELLITUS, TYPE II 09/19/2007   DISEASE, CEREBROVASCULAR NEC 03/05/2007   ESRD (end stage renal disease) on dialysis (HCC)    GERD 03/13/2007   HYPERLIPIDEMIA 03/05/2007   HYPERTENSION 03/05/2007   HYPOKALEMIA 11/09/2009   KNEE PAIN, RIGHT 11/09/2009   New onset a-fib (HCC) 04/27/2024   Eliquis    PROSTATE CANCER, HX OF 03/05/2007    Past Surgical History: Past Surgical History:  Procedure Laterality Date   A/V FISTULAGRAM N/A 03/21/2024   Procedure: A/V Fistulagram;  Surgeon: Magda Debby SAILOR, MD;  Location: [facility];  Service: Cardiovascular;  Laterality: N/A;   AV FISTULA PLACEMENT Left 10/18/2022   Procedure: INSERTION OF LEFT ARM BRACHIAL ARTERY TO AXILLARY VEIN ARTERIOVENOUS (AV) GORE-TEX GRAFT;  Surgeon: Lanis Fonda BRAVO, MD;  Location: [facility] OR;  Service: Vascular;  Laterality: Left;   CAROTID ARTERY ANGIOPLASTY Right Oct. 10, 2001   ESOPHAGOGASTRODUODENOSCOPY (EGD) WITH PROPOFOL  N/A 11/04/2016   Procedure: ESOPHAGOGASTRODUODENOSCOPY (EGD) WITH PROPOFOL ;  Surgeon: Layla Lah, MD;  Location: [facility]  ENDOSCOPY;  Service: Gastroenterology;  Laterality: N/A;   IR FLUORO GUIDE CV LINE RIGHT  10/25/2022   IR FLUORO GUIDE CV LINE RIGHT  10/27/2022   IR US  GUIDE VASC ACCESS RIGHT  10/25/2022   IR US  GUIDE VASC ACCESS RIGHT  10/27/2022   LAPAROSCOPIC APPENDECTOMY  02/20/2012   Procedure: APPENDECTOMY LAPAROSCOPIC;  Surgeon: Jina Nephew, MD;  Location: [facility] OR;  Service: General;  Laterality: N/A;   PROSTATE SURGERY     prostatectomy    Social History: Social History   Tobacco Use   Smoking status: Former    Current packs/day: 0.00    Average packs/day: 1 pack/day for 10.0 years (10.0 ttl pk-yrs)    Types: Cigarettes    Start date: 08/22/1964    Quit date: 08/22/1974    Years since quitting: 49.7   Smokeless tobacco: Never  Vaping Use   Vaping status: Never Used  Substance Use Topics   Alcohol  use: No    Alcohol /week: 0.0 standard drinks of alcohol    Drug use: No    Family History: Family History  Problem Relation Age of Onset  Hypertension Mother    Cancer Father        Mesothelioma    Stomach cancer Brother    Cancer Brother    Cancer - Cervical Brother    Cancer Brother    Diabetes Brother    Esophageal cancer Neg Hx    Colon cancer Neg Hx    Pancreatic cancer Neg Hx     ROS:   Review of Systems  Cardiovascular:  Negative for chest pain, claudication, irregular heartbeat, leg swelling, near-syncope, orthopnea, palpitations, paroxysmal nocturnal dyspnea and syncope.  Respiratory:  Negative for shortness of breath.   Hematologic/Lymphatic: Negative for bleeding problem.    EKGs/Labs/Other Studies Reviewed:   EKG: EKG Interpretation Date/Time:  Thursday May 02 2024 14:36:10 EDT Ventricular Rate:  74 PR Interval:    QRS Duration:  86 QT Interval:  428 QTC Calculation: 475 R Axis:   -21  Text Interpretation: Sinus rhythm with 2nd degree A-V block (Mobitz I) Minimal voltage criteria for LVH, may be normal variant ( R in aVL ) T wave abnormality, consider lateral  ischemia Prolonged QT When compared with ECG of 27-Apr-2024 13:32, Sinus rhythm has replaced Atrial fibrillation T wave inversion now evident in Lateral leads Confirmed by Michele Richardson [phone]) on 05/02/2024 2:44:24 PM  Echocardiogram: 04/2024  1. Distal septal hypokinesis. Left ventricular ejection fraction, by  estimation, is 55%. The left ventricle has low normal function. The left  ventricle has no regional wall motion abnormalities. Left ventricular  diastolic parameters are consistent with  Grade I diastolic dysfunction (impaired relaxation).   2. Right ventricular systolic function is normal. The right ventricular  size is normal.   3. Left atrial size was severely dilated.   4. The mitral valve is degenerative. Trivial mitral valve regurgitation.  No evidence of mitral stenosis. Moderate mitral annular calcification.   5. The aortic valve is tricuspid. There is moderate calcification of the  aortic valve. There is moderate thickening of the aortic valve. Aortic  valve regurgitation is trivial. Aortic valve sclerosis/calcification is  present, without any evidence of  aortic stenosis.   6. The inferior vena cava is normal in size with greater than 50%  respiratory variability, suggesting right atrial pressure of 3 mmHg.    Carotid duplex: 10/2023 Right Carotid: Velocities in the right ICA are consistent with a 1-39%  stenosis.   Left Carotid: Velocities in the left ICA are consistent with a 1-39%  stenosis.   Vertebrals: Bilateral vertebral arteries demonstrate antegrade flow.  Subclavians: Normal flow hemodynamics were seen in bilateral subclavian               arteries.   RADIOLOGY: MR BRAIN WO CONTRAST 04/25/2024 1. Acute infarct in the right PCA territory as above. 2. 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 3. Additional remote lacunar infarcts in the left thalamus. 4. Focus of signal abnormality and susceptibility within the left  parietal lobe, possibly representing remote hemorrhage or vascular malformation such as cavernous malformation. Additional similar focus of signal abnormality and susceptibility along the right dorsal aspect of the pons.  Risk Assessment/Calculations:   Click Here to Calculate/Change CHADS2VASc Score The patient's CHADS2-VASc score is 7, indicating a 11.2% annual risk of stroke.   CHF History: No HTN History: Yes Diabetes History: Yes Stroke History: Yes Vascular Disease History: Yes  Labs:    Latest Ref Rng & Units 04/27/2024    7:53 AM 04/25/2024    8:36 PM 04/25/2024  8:35 PM  CBC  WBC 4.0 - 10.5 K/uL 8.8   10.1   Hemoglobin 13.0 - 17.0 g/dL 89.2  86.0  87.8   Hematocrit 39.0 - 52.0 % 32.9  41.0  37.8   Platelets 150 - 400 K/uL 417   433        Latest Ref Rng & Units 04/27/2024    7:54 AM 04/25/2024    8:36 PM 04/25/2024    8:35 PM  BMP  Glucose 70 - 99 mg/dL 869  846  846   BUN 8 - 23 mg/dL 70  47  44   Creatinine 0.61 - 1.24 mg/dL 88.51  1.79  1.75   Sodium 135 - 145 mmol/L 133  138  139   Potassium 3.5 - 5.1 mmol/L 3.8  3.7  3.7   Chloride 98 - 111 mmol/L 92  99  95   CO2 22 - 32 mmol/L 20   24   Calcium  8.9 - 10.3 mg/dL 9.0   9.8       Latest Ref Rng & Units 04/27/2024    7:54 AM 04/25/2024    8:36 PM 04/25/2024    8:35 PM  CMP  Glucose 70 - 99 mg/dL 869  846  846   BUN 8 - 23 mg/dL 70  47  44   Creatinine 0.61 - 1.24 mg/dL 88.51  1.79  1.75   Sodium 135 - 145 mmol/L 133  138  139   Potassium 3.5 - 5.1 mmol/L 3.8  3.7  3.7   Chloride 98 - 111 mmol/L 92  99  95   CO2 22 - 32 mmol/L 20   24   Calcium  8.9 - 10.3 mg/dL 9.0   9.8   Total Protein 6.5 - 8.1 g/dL   8.5   Total Bilirubin 0.0 - 1.2 mg/dL   1.1   Alkaline Phos 38 - 126 U/L   93   AST 15 - 41 U/L   13   ALT 0 - 44 U/L   8     Lab Results  Component Value Date   CHOL 121 03/07/2024   HDL 46.60 03/07/2024   LDLCALC 53 03/07/2024   TRIG 109.0 03/07/2024   CHOLHDL 3 03/07/2024   No results for input(s):  LIPOA in the last 8760 hours. No components found for: NTPROBNP No results for input(s): PROBNP in the last 8760 hours. Recent Labs    03/07/24 1052  TSH 1.23    Physical Exam:    Today's Vitals   05/02/24 1430  BP: (!) 133/52  Pulse: 97  Resp: 16  SpO2: 96%  Weight: 172 lb 6.4 oz (78.2 kg)  Height: 5' 7 (1.702 m)   Body mass index is 27 kg/m. Wt Readings from Last 3 Encounters:  05/02/24 172 lb 6.4 oz (78.2 kg)  05/02/24 173 lb (78.5 kg)  04/28/24 171 lb 15.3 oz (78 kg)    Physical Exam  Constitutional: No distress. He appears chronically ill.  hemodynamically stable  Neck: No JVD present.  Cardiovascular: Normal rate, regular rhythm, S1 normal and S2 normal. Exam reveals no gallop, no S3 and no S4.  Murmur heard. Systolic murmur is present with a grade of 3/6. HD access LUE + thrill   Pulmonary/Chest: Effort normal and breath sounds normal. No stridor. He has no wheezes. He has no rales.  Musculoskeletal:        General: No edema.     Cervical back: Neck supple.  Skin:  Skin is warm.     Impression & Recommendation(s):  Impression:   ICD-10-CM   1. Paroxysmal atrial fibrillation (HCC)  I48.0 EKG 12-Lead    Hemoglobin and hematocrit, blood    2. Long term (current) use of anticoagulants  Z79.01     3. Hx of ischemic right PCA stroke  Z86.73     4. ESRD on dialysis (HCC)  N18.6    Z99.2     5. Mixed hyperlipidemia  E78.2     6. Atherosclerosis of both carotid arteries  I65.23        Recommendation(s):  Paroxysmal atrial fibrillation (HCC) Long term (current) use of anticoagulants Newly discovered September 2025. Please on the most recent echocardiogram left atrium is severely dilated. Rate control: N/A. Thromboembolic prophylaxis: Eliquis  Diagnosed in September 2025 during his admission for stroke while working with occupational therapy.  Spontaneously converted back to sinus rhythm without intervention. Recommend checking H&H every 6  months. Risks, benefits, alternatives to anticoagulation discussed.  Patient and wife are agreeable with being on anticoagulation for thromboembolic prophylaxis.  EKG illustrates sinus rhythm with secondary type I AV block.  To decrease the possibility of conduction disease recommend weaning off Cardizem.  Currently on Catapres  from 0.3 mg p.o. daily will transition to 0.2 mg p.o. daily.  Recommend weaning off clonidine  over some time this can be done by PCP or nephrology going forward.  If he needs additional blood pressure medications uptitration of BiDil  can be considered.  And if rate control strategy is needed addition of Toprol -XL would be recommended.  Recommended that he follows up with PCP and consider evaluation for sleep apnea.  Will arrange follow-up with APP in 6 months and with myself in a year.  Hx of ischemic right PCA stroke Right PCA stroke diagnosed in September 2025. MRI of the brain during the workup also illustrates prior strokes as well as lacunar infarcts. Reemphasize importance of secondary prevention. Follows with neurology  ESRD on dialysis [facility]) Dialysis access site left upper extremity. Follows with nephrology  Mixed hyperlipidemia Currently on Lipitor 20 mg p.o. daily Most recent LDL 53 mg/dL as of July 2025  Atherosclerosis of both carotid arteries Not on antiplatelet therapy as he is on anticoagulation for A-fib. Continue lipid-lowering agents. Secondary prevention Most recent carotid duplex results reviewed   Orders Placed:  Orders Placed This Encounter  Procedures   Hemoglobin and hematocrit, blood    Standing Status:   Future    Expected Date:   10/30/2024    Expiration Date:   05/02/2025    Remote health to draw?:   No   EKG 12-Lead     Final Medication List:    Meds ordered this encounter  Medications   cloNIDine  (CATAPRES ) 0.2 MG tablet    Sig: Take 1 tablet (0.2 mg total) by mouth daily.    Dispense:  30 tablet    Refill:  6     Medications Discontinued During This Encounter  Medication Reason   cloNIDine  (CATAPRES ) 0.3 MG tablet Dose change     Current Outpatient Medications:    Accu-Chek Softclix Lancets lancets, USED TO CHECK BLOOD GLUCOSE TWICE A DAY OR AS NEEDED, Disp: 100 each, Rfl: 6   acetaminophen  (TYLENOL ) 500 MG tablet, Take 1,000 mg by mouth every 4 (four) hours as needed., Disp: , Rfl:    albuterol  (VENTOLIN  HFA) 108 (90 Base) MCG/ACT inhaler, TAKE 2 PUFFS BY MOUTH EVERY 6 HOURS AS NEEDED FOR WHEEZE OR SHORTNESS OF BREATH, Disp:  8.5 each, Rfl: 5   amLODipine  (NORVASC ) 10 MG tablet, TAKE 1 TABLET BY MOUTH EVERY DAY, Disp: 90 tablet, Rfl: 3   apixaban  (ELIQUIS ) 2.5 MG TABS tablet, Take 1 tablet (2.5 mg total) by mouth 2 (two) times daily., Disp: 60 tablet, Rfl: 11   aspirin  EC 81 MG tablet, Take 81 mg by mouth daily. Swallow whole., Disp: , Rfl:    atorvastatin  (LIPITOR) 20 MG tablet, TAKE 1 TABLET BY MOUTH EVERY DAY, Disp: 90 tablet, Rfl: 3   cetirizine  (ZYRTEC ) 5 MG tablet, Take 1 tablet (5 mg total) by mouth at bedtime., Disp: 60 tablet, Rfl: 0   cholecalciferol  (VITAMIN D3) 25 MCG (1000 UNIT) tablet, Take 1,000 Units by mouth daily., Disp: , Rfl:    cloNIDine  (CATAPRES ) 0.2 MG tablet, Take 1 tablet (0.2 mg total) by mouth daily., Disp: 30 tablet, Rfl: 6   doxazosin  (CARDURA ) 2 MG tablet, Take 2 mg by mouth at bedtime. , Disp: , Rfl: 3   esomeprazole  (NEXIUM ) 40 MG capsule, TAKE 1 CAPSULE BY MOUTH EVERY DAY, Disp: 90 capsule, Rfl: 3   fluticasone  (FLONASE ) 50 MCG/ACT nasal spray, Place 1 spray into both nostrils daily., Disp: 9.9 mL, Rfl: 2   glucose blood (ACCU-CHEK GUIDE) test strip, USE TO CHECK BLOOD GLUCOSE TWICE A DAY OR AS NEEDED, Disp: 300 strip, Rfl: 1   guaiFENesin  (MUCINEX ) 600 MG 12 hr tablet, Take 600 mg by mouth at bedtime., Disp: , Rfl:    isosorbide -hydrALAZINE  (BIDIL ) 20-37.5 MG tablet, Take 1 tablet by mouth 3 (three) times daily., Disp: 30 tablet, Rfl: 0   ketoconazole  (NIZORAL ) 2 %  cream, Apply 1 Application topically 2 (two) times daily., Disp: 60 g, Rfl: 2   lactulose  (CHRONULAC ) 10 GM/15ML solution, TAKE 30 ML BY MOUTH TWICE DAILY AS NEEDED, Disp: 237 mL, Rfl: 3   Melatonin 10 MG TABS, Take 10 mg by mouth at bedtime., Disp: , Rfl:    Methoxy PEG-Epoetin  Beta (MIRCERA IJ), Mircera, Disp: , Rfl:    methylcellulose (CITRUCEL) oral powder, 1 tablespoon every 2-3 days,alternating with lactulose , Disp: , Rfl:    Multiple Vitamins-Minerals (PRESERVISION AREDS 2) CAPS, Take 1 capsule by mouth daily., Disp: , Rfl:    Propylene Glycol (SYSTANE BALANCE OP), Place 1 drop into both eyes at bedtime., Disp: , Rfl:    sevelamer  carbonate (RENVELA ) 800 MG tablet, Take 800 mg by mouth 3 (three) times daily., Disp: , Rfl:   Consent:   N/A  Disposition:   1 year follow-up sooner if needed  His questions and concerns were addressed to his satisfaction. He voices understanding of the recommendations provided during this encounter.    Signed, Madonna Michele HAS, [facility]  A Division of [facility] [address] [place], [place] [address]  05/02/2024 7:06 PM

## 2024-05-03 DIAGNOSIS — N2581 Secondary hyperparathyroidism of renal origin: Secondary | ICD-10-CM | POA: Diagnosis not present

## 2024-05-03 DIAGNOSIS — N186 End stage renal disease: Secondary | ICD-10-CM | POA: Diagnosis not present

## 2024-05-03 DIAGNOSIS — Z992 Dependence on renal dialysis: Secondary | ICD-10-CM | POA: Diagnosis not present

## 2024-05-05 ENCOUNTER — Emergency Department (HOSPITAL_COMMUNITY)

## 2024-05-05 ENCOUNTER — Other Ambulatory Visit: Payer: Self-pay

## 2024-05-05 ENCOUNTER — Emergency Department (HOSPITAL_COMMUNITY)
Admission: EM | Admit: 2024-05-05 | Discharge: 2024-05-05 | Disposition: A | Discharge status: Home / Self Care | Attending: Emergency Medicine | Admitting: Emergency Medicine

## 2024-05-05 ENCOUNTER — Encounter (HOSPITAL_COMMUNITY): Payer: Self-pay

## 2024-05-05 DIAGNOSIS — Z7901 Long term (current) use of anticoagulants: Secondary | ICD-10-CM | POA: Diagnosis not present

## 2024-05-05 DIAGNOSIS — Z992 Dependence on renal dialysis: Secondary | ICD-10-CM | POA: Insufficient documentation

## 2024-05-05 DIAGNOSIS — G319 Degenerative disease of nervous system, unspecified: Secondary | ICD-10-CM | POA: Diagnosis not present

## 2024-05-05 DIAGNOSIS — R519 Headache, unspecified: Secondary | ICD-10-CM | POA: Diagnosis not present

## 2024-05-05 DIAGNOSIS — I482 Chronic atrial fibrillation, unspecified: Secondary | ICD-10-CM | POA: Diagnosis not present

## 2024-05-05 DIAGNOSIS — E871 Hypo-osmolality and hyponatremia: Secondary | ICD-10-CM | POA: Diagnosis not present

## 2024-05-05 DIAGNOSIS — Z7982 Long term (current) use of aspirin: Secondary | ICD-10-CM | POA: Diagnosis not present

## 2024-05-05 DIAGNOSIS — N186 End stage renal disease: Secondary | ICD-10-CM | POA: Diagnosis not present

## 2024-05-05 DIAGNOSIS — Z8673 Personal history of transient ischemic attack (TIA), and cerebral infarction without residual deficits: Secondary | ICD-10-CM | POA: Insufficient documentation

## 2024-05-05 DIAGNOSIS — I639 Cerebral infarction, unspecified: Secondary | ICD-10-CM | POA: Diagnosis not present

## 2024-05-05 DIAGNOSIS — I6782 Cerebral ischemia: Secondary | ICD-10-CM | POA: Diagnosis not present

## 2024-05-05 DIAGNOSIS — J320 Chronic maxillary sinusitis: Secondary | ICD-10-CM | POA: Insufficient documentation

## 2024-05-05 LAB — COMPREHENSIVE METABOLIC PANEL WITH GFR
ALT: 5 U/L (ref 0–44)
AST: 11 U/L — ABNORMAL LOW (ref 15–41)
Albumin: 3.5 g/dL (ref 3.5–5.0)
Alkaline Phosphatase: 79 U/L (ref 38–126)
Anion gap: 16 — ABNORMAL HIGH (ref 5–15)
BUN: 44 mg/dL — ABNORMAL HIGH (ref 8–23)
CO2: 22 mmol/L (ref 22–32)
Calcium: 9 mg/dL (ref 8.9–10.3)
Chloride: 96 mmol/L — ABNORMAL LOW (ref 98–111)
Creatinine, Ser: 9.42 mg/dL — ABNORMAL HIGH (ref 0.61–1.24)
GFR, Estimated: 5 mL/min — ABNORMAL LOW (ref >60)
Glucose, Bld: 127 mg/dL — ABNORMAL HIGH (ref 70–99)
Potassium: 3.8 mmol/L (ref 3.5–5.1)
Sodium: 134 mmol/L — ABNORMAL LOW (ref 135–145)
Total Bilirubin: 0.9 mg/dL (ref 0.0–1.2)
Total Protein: 6.9 g/dL (ref 6.5–8.1)

## 2024-05-05 LAB — RESP PANEL BY RT-PCR (RSV, FLU A&B, COVID)  RVPGX2
Influenza A by PCR: NEGATIVE
Influenza B by PCR: NEGATIVE
Resp Syncytial Virus by PCR: NEGATIVE
SARS Coronavirus 2 by RT PCR: NEGATIVE

## 2024-05-05 LAB — GROUP A STREP BY PCR: Group A Strep by PCR: NOT DETECTED

## 2024-05-05 LAB — CBC
HCT: 34 % — ABNORMAL LOW (ref 39.0–52.0)
Hemoglobin: 10.8 g/dL — ABNORMAL LOW (ref 13.0–17.0)
MCH: 28.8 pg (ref 26.0–34.0)
MCHC: 31.8 g/dL (ref 30.0–36.0)
MCV: 90.7 fL (ref 80.0–100.0)
Platelets: 423 K/uL — ABNORMAL HIGH (ref 150–400)
RBC: 3.75 MIL/uL — ABNORMAL LOW (ref 4.22–5.81)
RDW: 17.2 % — ABNORMAL HIGH (ref 11.5–15.5)
WBC: 8.4 K/uL (ref 4.0–10.5)
nRBC: 0.4 % — ABNORMAL HIGH (ref 0.0–0.2)

## 2024-05-05 MED ORDER — METOCLOPRAMIDE HCL 5 MG/ML IJ SOLN
10.0000 mg | Freq: Once | INTRAMUSCULAR | Status: AC
  Administered 2024-05-05: 10 mg via INTRAVENOUS
  Filled 2024-05-05: qty 2

## 2024-05-05 MED ORDER — DIPHENHYDRAMINE HCL 50 MG/ML IJ SOLN
12.5000 mg | Freq: Once | INTRAMUSCULAR | Status: AC
  Administered 2024-05-05: 12.5 mg via INTRAVENOUS
  Filled 2024-05-05: qty 1

## 2024-05-05 MED ORDER — SODIUM CHLORIDE 0.9 % IV BOLUS
500.0000 mL | Freq: Once | INTRAVENOUS | Status: AC
  Administered 2024-05-05: 500 mL via INTRAVENOUS

## 2024-05-05 NOTE — ED Triage Notes (Addendum)
 Pt c.o posterior headache intermittently for the past 2 days, no other symptoms with the headache. No neuro deficits noted in triage. Pt receives dialysis MWF, last was on Friday

## 2024-05-05 NOTE — ED Provider Notes (Signed)
 [facility] AT [facility] Provider Note   CSN: [id number] Arrival date & time: 05/05/24  1018     Patient presents with: Headache  HPI Austin [name] is a 89 y.o. male with recent admission for acute CVA and new onset atrial fibrillation, ESRD presenting for headache.  Started 2 days ago.  He states it is in the back of his head and radiates forward.  Denies neck stiffness or fever.  Does report he had a sore throat as well and reports sick contact with grandson who had similar symptoms couple days ago.  He denies any visual disturbances, weakness or numbness in his extremities, facial droop or slurred speech.  He is taking his Eliquis  as prescribed to him.  Also reports he goes to dialysis Monday Wednesday Friday and went this past Friday.    Headache      Prior to Admission medications   Medication Sig Start Date End Date Taking? Authorizing Provider  Accu-Chek Softclix Lancets lancets USED TO CHECK BLOOD GLUCOSE TWICE A DAY OR AS NEEDED 06/08/23   Nafziger, Darleene, NP  acetaminophen  (TYLENOL ) 500 MG tablet Take 1,000 mg by mouth every 4 (four) hours as needed.    [provider]  albuterol  (VENTOLIN  HFA) 108 (90 Base) MCG/ACT inhaler TAKE 2 PUFFS BY MOUTH EVERY 6 HOURS AS NEEDED FOR WHEEZE OR SHORTNESS OF BREATH 09/13/22   Shelah Lamar RAMAN, MD  amLODipine  (NORVASC ) 10 MG tablet TAKE 1 TABLET BY MOUTH EVERY DAY 12/05/23   Nafziger, Darleene, NP  apixaban  (ELIQUIS ) 2.5 MG TABS tablet Take 1 tablet (2.5 mg total) by mouth 2 (two) times daily. 04/27/24   Arlice Reichert, MD  aspirin  EC 81 MG tablet Take 81 mg by mouth daily. Swallow whole.    [provider]  atorvastatin  (LIPITOR) 20 MG tablet TAKE 1 TABLET BY MOUTH EVERY DAY 03/26/24   Nafziger, Darleene, NP  cetirizine  (ZYRTEC ) 5 MG tablet Take 1 tablet (5 mg total) by mouth at bedtime. 03/15/24   Rising, Asberry, PA-C  cholecalciferol  (VITAMIN D3) 25 MCG (1000 UNIT) tablet Take 1,000 Units by mouth daily.     [provider]  cloNIDine  (CATAPRES ) 0.2 MG tablet Take 1 tablet (0.2 mg total) by mouth daily. 05/02/24   Tolia, Sunit, DO  doxazosin  (CARDURA ) 2 MG tablet Take 2 mg by mouth at bedtime.  12/28/17   [provider]  esomeprazole  (NEXIUM ) 40 MG capsule TAKE 1 CAPSULE BY MOUTH EVERY DAY 03/07/24   Nafziger, Darleene, NP  fluticasone  (FLONASE ) 50 MCG/ACT nasal spray Place 1 spray into both nostrils daily. 03/15/24   Rising, Rebecca, PA-C  glucose blood (ACCU-CHEK GUIDE) test strip USE TO CHECK BLOOD GLUCOSE TWICE A DAY OR AS NEEDED 04/27/23   Nafziger, Darleene, NP  guaiFENesin  (MUCINEX ) 600 MG 12 hr tablet Take 600 mg by mouth at bedtime.    [provider]  isosorbide -hydrALAZINE  (BIDIL ) 20-37.5 MG tablet Take 1 tablet by mouth 3 (three) times daily. 04/27/24   Arlice Reichert, MD  ketoconazole  (NIZORAL ) 2 % cream Apply 1 Application topically 2 (two) times daily. 04/04/24   McDonald, Juliene SAUNDERS, DPM  lactulose  (CHRONULAC ) 10 GM/15ML solution TAKE 30 ML BY MOUTH TWICE DAILY AS NEEDED 04/24/24   Nafziger, Darleene, NP  Melatonin 10 MG TABS Take 10 mg by mouth at bedtime.    [provider]  Methoxy PEG-Epoetin  Beta (MIRCERA IJ) Mircera 10/30/23 10/28/24  [provider]  methylcellulose (CITRUCEL) oral powder 1 tablespoon every 2-3 days,alternating  with lactulose     [provider]  Multiple Vitamins-Minerals (PRESERVISION AREDS 2) CAPS Take 1 capsule by mouth daily. 01/29/24   [provider]  Propylene Glycol (SYSTANE BALANCE OP) Place 1 drop into both eyes at bedtime.    [provider]  sevelamer  carbonate (RENVELA ) 800 MG tablet Take 800 mg by mouth 3 (three) times daily. 12/08/22   [provider]    Allergies: Aspirin     Review of Systems  Neurological:  Positive for headaches.    Updated Vital Signs BP (!) 148/59   Pulse 69   Temp 98.4 F (36.9 C)   Resp 15   SpO2 100%   Physical Exam Vitals and nursing note reviewed.  HENT:      Head: Normocephalic and atraumatic.     Mouth/Throat:     Mouth: Mucous membranes are moist.  Eyes:     General:        Right eye: No discharge.        Left eye: No discharge.     Conjunctiva/sclera: Conjunctivae normal.  Cardiovascular:     Rate and Rhythm: Normal rate and regular rhythm.     Pulses: Normal pulses.     Heart sounds: Normal heart sounds.  Pulmonary:     Effort: Pulmonary effort is normal.     Breath sounds: Normal breath sounds.  Abdominal:     General: Abdomen is flat.     Palpations: Abdomen is soft.  Skin:    General: Skin is warm and dry.  Neurological:     General: No focal deficit present.     Comments: GCS 15. Speech is goal oriented. No deficits appreciated to CN III-XII; symmetric eyebrow raise, no facial drooping, tongue midline. Patient has equal grip strength bilaterally with 5/5 strength against resistance in all major muscle groups bilaterally. Sensation to light touch intact. Patient moves extremities without ataxia. Normal finger-nose-finger. Patient ambulatory with steady gait.  Psychiatric:        Mood and Affect: Mood normal.     (all labs ordered are listed, but only abnormal results are displayed) Labs Reviewed  COMPREHENSIVE METABOLIC PANEL WITH GFR - Abnormal; Notable for the following components:      Result Value   Sodium 134 (*)    Chloride 96 (*)    Glucose, Bld 127 (*)    BUN 44 (*)    Creatinine, Ser 9.42 (*)    AST 11 (*)    GFR, Estimated 5 (*)    Anion gap 16 (*)    All other components within normal limits  CBC - Abnormal; Notable for the following components:   RBC 3.75 (*)    Hemoglobin 10.8 (*)    HCT 34.0 (*)    RDW 17.2 (*)    Platelets 423 (*)    nRBC 0.4 (*)    All other components within normal limits  RESP PANEL BY RT-PCR (RSV, FLU A&B, COVID)  [id number]  GROUP A STREP BY PCR    EKG: None  Radiology: CT Head Wo Contrast Result Date: 05/05/2024 EXAM: CT HEAD WITHOUT CONTRAST 05/05/2024 01:19:58 PM  TECHNIQUE: CT of the head was performed without the administration of intravenous contrast. Automated exposure control, iterative reconstruction, and/or weight based adjustment of the mA/kV was utilized to reduce the radiation dose to as low as reasonably achievable. COMPARISON: 04/26/2024 CLINICAL HISTORY: Headache, new onset (Age >= 51y). Pt endorses headache for over 24 hours that is mild. States he was seen recently  for a stroke and had HA then. FINDINGS: BRAIN AND VENTRICLES: No acute hemorrhage. No evidence of acute infarct. No hydrocephalus. No extra-axial collection. No mass effect or midline shift. Hypoattenuating foci in the cerebral white matter, most likely representing chronic small vessel disease. Prominence of the sulci and ventricles compatible with brain atrophy. Chronic lacunar infarct in the right basal ganglia is unchanged. ORBITS: No acute abnormality. SINUSES: Chronic right maxillary sinusitis with persistent incomplete opacification and mild expansion of the included portions of the sinus. SOFT TISSUES AND SKULL: No acute soft tissue abnormality. No skull fracture. Mastoid air cells are clear. IMPRESSION: 1. No acute intracranial abnormality. 2. Chronic small vessel ischemic disease and brain atrophy. 3. Chronic right maxillary sinusitis with persistent opacification and mild expansion. Electronically signed by: Waddell Calk MD 05/05/2024 01:52 PM EDT RP Workstation: [id number]     Procedures   Medications Ordered in the ED  sodium chloride  0.9 % bolus 500 mL (500 mLs Intravenous New Bag/Given 05/05/24 1505)  metoCLOPramide  (REGLAN ) injection 10 mg (10 mg Intravenous Given 05/05/24 1507)  diphenhydrAMINE  (BENADRYL ) injection 12.5 mg (12.5 mg Intravenous Given 05/05/24 1505)    Clinical Course as of 05/05/24 1540  Sun May 05, 2024  1400 CT Head Wo Contrast [JR]    Clinical Course User Index [JR] Lang Norleen POUR, PA-C                                 Medical Decision  Making Amount and/or Complexity of Data Reviewed Labs: ordered. Radiology:  Decision-making details documented in ED Course.  Risk Prescription drug management.   Initial Impression and Ddx 89 year old well-appearing male presenting for headache.  Exam was unremarkable.  DDx includes stroke, meningitis, hypertensive emergency, SAH, electrolyte derangement, dehydration, COVID or flu, other. Patient PMH that increases complexity of ED encounter:  recent admission for acute CVA and new onset atrial fibrillation, ESRD  Interpretation of Diagnostics - I independent reviewed and interpreted the labs as followed: no acute changes  - I independently visualized the following imaging with scope of interpretation limited to determining acute life threatening conditions related to emergency care: CT head non con, which revealed no acute change  - Per chart review was here 2 weeks ago and was admitted for acute right PCA territory stroke and new onset A-fib.  Patient Reassessment and Ultimate Disposition/Management On reassessment he states that headache had resolved, he was feeling much better and expresses desire to go home.  Consider MRI but felt it was not warranted today given reassuring neuroexam, CT and headache appear to be minor and resolved after treatment.  He is also compliant taking his Eliquis .  Feel at this time that he is safe for discharge.  Discussed pertinent return precautions.  Advised PCP follow-up.  Discharge in good condition.  Patient management required discussion with the following services or consulting groups:  None  Complexity of Problems Addressed Acute complicated illness or Injury  Additional Data Reviewed and Analyzed Further history obtained from: Past medical history and medications listed in the EMR and Prior ED visit notes  Patient Encounter Risk Assessment Consideration of hospitalization      Final diagnoses:  Nonintractable headache, unspecified  chronicity pattern, unspecified headache type    ED Discharge Orders     None          Lang Norleen POUR, PA-C 05/05/24 1540    Horton, Roxie HERO, DO 05/08/24 0957

## 2024-05-05 NOTE — Discharge Instructions (Signed)
 Evaluation today was overall reassuring.  Please follow-up with your PCP.  As we discussed if your headache returns, you have slurred speech, facial droop, weakness or numbness in your extremities, visual changes or any other concerning symptom please return to the ED for further evaluation.

## 2024-05-05 NOTE — ED Triage Notes (Signed)
 Pt endorses headache for over 24 hours that is mild. States he was seen recently for a stroke and had HA then.

## 2024-05-05 NOTE — ED Notes (Signed)
 Patient transported to CT

## 2024-05-06 DIAGNOSIS — N2581 Secondary hyperparathyroidism of renal origin: Secondary | ICD-10-CM | POA: Diagnosis not present

## 2024-05-06 DIAGNOSIS — N186 End stage renal disease: Secondary | ICD-10-CM | POA: Diagnosis not present

## 2024-05-06 DIAGNOSIS — Z992 Dependence on renal dialysis: Secondary | ICD-10-CM | POA: Diagnosis not present

## 2024-05-08 DIAGNOSIS — N2581 Secondary hyperparathyroidism of renal origin: Secondary | ICD-10-CM | POA: Diagnosis not present

## 2024-05-08 DIAGNOSIS — Z992 Dependence on renal dialysis: Secondary | ICD-10-CM | POA: Diagnosis not present

## 2024-05-08 DIAGNOSIS — N186 End stage renal disease: Secondary | ICD-10-CM | POA: Diagnosis not present

## 2024-05-10 DIAGNOSIS — N186 End stage renal disease: Secondary | ICD-10-CM | POA: Diagnosis not present

## 2024-05-10 DIAGNOSIS — Z992 Dependence on renal dialysis: Secondary | ICD-10-CM | POA: Diagnosis not present

## 2024-05-10 DIAGNOSIS — N2581 Secondary hyperparathyroidism of renal origin: Secondary | ICD-10-CM | POA: Diagnosis not present

## 2024-05-13 DIAGNOSIS — Z992 Dependence on renal dialysis: Secondary | ICD-10-CM | POA: Diagnosis not present

## 2024-05-13 DIAGNOSIS — N186 End stage renal disease: Secondary | ICD-10-CM | POA: Diagnosis not present

## 2024-05-13 DIAGNOSIS — N2581 Secondary hyperparathyroidism of renal origin: Secondary | ICD-10-CM | POA: Diagnosis not present

## 2024-05-14 ENCOUNTER — Ambulatory Visit: Admitting: Family Medicine

## 2024-05-15 ENCOUNTER — Telehealth: Payer: Self-pay

## 2024-05-15 DIAGNOSIS — N2581 Secondary hyperparathyroidism of renal origin: Secondary | ICD-10-CM | POA: Diagnosis not present

## 2024-05-15 DIAGNOSIS — Z992 Dependence on renal dialysis: Secondary | ICD-10-CM | POA: Diagnosis not present

## 2024-05-15 DIAGNOSIS — N186 End stage renal disease: Secondary | ICD-10-CM | POA: Diagnosis not present

## 2024-05-15 NOTE — Telephone Encounter (Signed)
 I tried calling the patient x2 times.  Pt answered the phone and then immediatly hung up.    Per lov We will plan to repeat your CT chest in August 2025 to compare with priors  Ct has not been completed.  Unable to speak with the patient to cancel appointment for 9/25.

## 2024-05-16 ENCOUNTER — Ambulatory Visit: Admitting: Emergency Medicine

## 2024-05-16 ENCOUNTER — Encounter: Payer: Self-pay | Admitting: Emergency Medicine

## 2024-05-16 ENCOUNTER — Encounter (HOSPITAL_COMMUNITY): Payer: Self-pay

## 2024-05-16 VITALS — BP 130/74 | HR 72 | Temp 98.0°F | Ht 67.0 in | Wt 181.4 lb

## 2024-05-16 DIAGNOSIS — R918 Other nonspecific abnormal finding of lung field: Secondary | ICD-10-CM | POA: Diagnosis not present

## 2024-05-16 DIAGNOSIS — R058 Other specified cough: Secondary | ICD-10-CM

## 2024-05-16 NOTE — Progress Notes (Signed)
   Subjective:    Patient ID: Austin [name], male    DOB: 15-Jul-1935, 89 y.o.   MRN: [id number]  HPI   ROV 05/16/2024 --follow-up visit for 89 year old male with history of upper airway irritation syndrome and chronic cough in the setting of GERD and chronic rhinitis, chronic renal failure on HD with hypertension and diastolic CHF.  I have also followed him for pulmonary nodular disease with nodules that have been stable on serial imaging including his last in April 2024.  I had planned to repeat his CT in August 2025 but this has not been done yet. He does a lot of throat clearing. He has a lot of drainage especially when eating. No real reflux - uses Nexium  and has good results. He is on zyrtec  and flonase . He is fairly sedentary. He does HD MWF, tolerates, does get some cramping. He had a mild CVA at the beginning of this month.    Review of Systems As per HPI     Objective:   Physical Exam Vitals:   05/16/24 1358  BP: 130/74  Pulse: 72  Temp: 98 F (36.7 C)  TempSrc: Temporal  SpO2: 98%  Weight: 181 lb 6.4 oz (82.3 kg)  Height: 5' 7 (1.702 m)   Gen: Pleasant, well-nourished, in no distress,  normal affect  ENT: No lesions,  mouth clear,  oropharynx clear, no postnasal drip  Neck: No JVD, no stridor  Lungs: No use of accessory muscles, no crackles or wheezing on normal respiration, no wheeze on forced expiration  Cardiovascular: RRR, heart sounds normal, no murmur or gallops, no peripheral edema  Musculoskeletal: No deformities, no cyanosis or clubbing  Neuro: alert, awake, non focal  Skin: Warm, no lesions or rash     Assessment & Plan:  Pulmonary nodules Pulmonary nodules, have been stable on serial imaging last done in April 2024.  He is due for repeat scanning to ensure stability.  Plan for CT chest now and then follow-up in office to review.  Upper airway cough syndrome Chronic cough which is less intrusive at this time but he does have persistent throat clearing.   Both rhinitis and GERD are factors.  He is on therapy for both.  Plan to continue same.  Please continue your Zyrtec  and fluticasone  nasal spray as you have been taking it Please continue your Nexium  as you have been taking it Call if you have any new problems or issues.    Lamar Chris, MD, PhD 05/16/2024, 9:12 PM [facility] [phone] or if no answer before 7:00PM call [phone] For any issues after 7:00PM please call eLink [phone]

## 2024-05-16 NOTE — Assessment & Plan Note (Signed)
 Chronic cough which is less intrusive at this time but he does have persistent throat clearing.  Both rhinitis and GERD are factors.  He is on therapy for both.  Plan to continue same.  Please continue your Zyrtec  and fluticasone  nasal spray as you have been taking it Please continue your Nexium  as you have been taking it Call if you have any new problems or issues.

## 2024-05-16 NOTE — Assessment & Plan Note (Signed)
 Pulmonary nodules, have been stable on serial imaging last done in April 2024.  He is due for repeat scanning to ensure stability.  Plan for CT chest now and then follow-up in office to review.

## 2024-05-16 NOTE — Patient Instructions (Addendum)
 We will order a CT scan of your chest to compare with your priors. Please continue your Zyrtec  and fluticasone  nasal spray as you have been taking it Please continue your Nexium  as you have been taking it Follow-up in our office in about 1 month so we can review your CT scan results at that time. Call if you have any new problems or issues.

## 2024-05-17 DIAGNOSIS — N186 End stage renal disease: Secondary | ICD-10-CM | POA: Diagnosis not present

## 2024-05-17 DIAGNOSIS — Z992 Dependence on renal dialysis: Secondary | ICD-10-CM | POA: Diagnosis not present

## 2024-05-17 DIAGNOSIS — N2581 Secondary hyperparathyroidism of renal origin: Secondary | ICD-10-CM | POA: Diagnosis not present

## 2024-05-20 DIAGNOSIS — N2581 Secondary hyperparathyroidism of renal origin: Secondary | ICD-10-CM | POA: Diagnosis not present

## 2024-05-20 DIAGNOSIS — N186 End stage renal disease: Secondary | ICD-10-CM | POA: Diagnosis not present

## 2024-05-20 DIAGNOSIS — Z992 Dependence on renal dialysis: Secondary | ICD-10-CM | POA: Diagnosis not present

## 2024-05-21 DIAGNOSIS — E1022 Type 1 diabetes mellitus with diabetic chronic kidney disease: Secondary | ICD-10-CM | POA: Diagnosis not present

## 2024-05-21 DIAGNOSIS — N186 End stage renal disease: Secondary | ICD-10-CM | POA: Diagnosis not present

## 2024-05-21 DIAGNOSIS — Z992 Dependence on renal dialysis: Secondary | ICD-10-CM | POA: Diagnosis not present

## 2024-05-22 DIAGNOSIS — N2581 Secondary hyperparathyroidism of renal origin: Secondary | ICD-10-CM | POA: Diagnosis not present

## 2024-05-22 DIAGNOSIS — Z992 Dependence on renal dialysis: Secondary | ICD-10-CM | POA: Diagnosis not present

## 2024-05-22 DIAGNOSIS — N186 End stage renal disease: Secondary | ICD-10-CM | POA: Diagnosis not present

## 2024-05-22 NOTE — Therapy (Signed)
 OUTPATIENT PHYSICAL THERAPY NEURO EVALUATION   Patient Name: Austin [name] MRN: [id number] DOB:03-24-1935, 89 y.o., male Today's Date: 05/23/2024   PCP: Merna Huxley, NP  REFERRING PROVIDER: Merna Huxley, NP   END OF SESSION:  PT End of Session - 05/23/24 1358     Visit Number 1    Number of Visits 13    Date for Recertification  07/18/24    Authorization Type [employer]    Authorization Time Period auth submitted    PT Start Time 1311    PT Stop Time 1356    PT Time Calculation (min) 45 min    Equipment Utilized During Treatment Gait belt    Activity Tolerance Patient tolerated treatment well    Behavior During Therapy WFL for tasks assessed/performed          Past Medical History:  Diagnosis Date   Acute diastolic CHF (congestive heart failure) (HCC) 09/17/2020   Acute exacerbation of CHF (congestive heart failure) (HCC) 08/04/2019   Acute right PCA stroke (HCC) 04/25/2024   ANEMIA DUE TO CHRONIC BLOOD LOSS 03/13/2007   CAROTID ARTERY STENOSIS 05/10/2010   Coronary artery disease    DIABETES MELLITUS, TYPE II 09/19/2007   DISEASE, CEREBROVASCULAR NEC 03/05/2007   ESRD (end stage renal disease) on dialysis (HCC)    GERD 03/13/2007   HYPERLIPIDEMIA 03/05/2007   HYPERTENSION 03/05/2007   HYPOKALEMIA 11/09/2009   KNEE PAIN, RIGHT 11/09/2009   New onset a-fib (HCC) 04/27/2024   Eliquis    PROSTATE CANCER, HX OF 03/05/2007   Past Surgical History:  Procedure Laterality Date   A/V FISTULAGRAM N/A 03/21/2024   Procedure: A/V Fistulagram;  Surgeon: Magda Debby SAILOR, MD;  Location: [facility];  Service: Cardiovascular;  Laterality: N/A;   AV FISTULA PLACEMENT Left 10/18/2022   Procedure: INSERTION OF LEFT ARM BRACHIAL ARTERY TO AXILLARY VEIN ARTERIOVENOUS (AV) GORE-TEX GRAFT;  Surgeon: Lanis Fonda BRAVO, MD;  Location: [facility] OR;  Service: Vascular;  Laterality: Left;   CAROTID ARTERY ANGIOPLASTY Right Oct. 10, 2001   ESOPHAGOGASTRODUODENOSCOPY (EGD) WITH PROPOFOL  N/A  11/04/2016   Procedure: ESOPHAGOGASTRODUODENOSCOPY (EGD) WITH PROPOFOL ;  Surgeon: Layla Lah, MD;  Location: [facility];  Service: Gastroenterology;  Laterality: N/A;   IR FLUORO GUIDE CV LINE RIGHT  10/25/2022   IR FLUORO GUIDE CV LINE RIGHT  10/27/2022   IR US  GUIDE VASC ACCESS RIGHT  10/25/2022   IR US  GUIDE VASC ACCESS RIGHT  10/27/2022   LAPAROSCOPIC APPENDECTOMY  02/20/2012   Procedure: APPENDECTOMY LAPAROSCOPIC;  Surgeon: Jina Nephew, MD;  Location: [facility] OR;  Service: General;  Laterality: N/A;   PROSTATE SURGERY     prostatectomy   Patient Active Problem List   Diagnosis Date Noted   New onset atrial fibrillation (HCC) 04/27/2024   Acute CVA (cerebrovascular accident) (HCC) 04/25/2024   Lumbar facet arthropathy 04/16/2024   Secondary hyperparathyroidism of renal origin 06/02/2023   Preoperative evaluation of a medical condition to rule out surgical contraindications (TAR required) 05/04/2023   Right carpal tunnel syndrome 03/16/2023   Coagulation defect, unspecified 11/07/2022   Personal history of nicotine dependence 11/01/2022   ESRD on dialysis (HCC) 10/29/2022   Protein-calorie malnutrition, severe 10/28/2022   Anemia in chronic kidney disease 10/28/2022   Hyperlipidemia, unspecified 10/28/2022   Pneumonia due to COVID-19 virus 10/25/2022   CKD (chronic kidney disease) stage 5, GFR less than 15 ml/min (HCC) 10/25/2022   Severe pulmonary hypertension (HCC) 10/25/2022   Moderate aortic regurgitation 10/25/2022   CAD in native artery 10/25/2022  UTI (urinary tract infection) 09/27/2021   CHF exacerbation (HCC) 09/26/2021   Acute respiratory failure with hypoxia (HCC) 09/26/2021   Acute on chronic diastolic CHF (congestive heart failure) (HCC) 07/09/2021   Dyspnea 06/01/2021   Upper airway cough syndrome 04/06/2021   Porokeratosis 03/26/2021   Pulmonary nodules 10/22/2020   Acute on chronic renal failure 10/09/2020   Mass of right lung 09/18/2020   Acute kidney injury  superimposed on CKD 08/04/2019   Elevated troponin 08/04/2019   Hypokalemia 08/04/2019   Hypertensive urgency 03/28/2019   Elevated serum immunoglobulin free light chains 02/20/2019   Gastrointestinal hemorrhage with melena    Melena 11/03/2016   Carotid artery stenosis 05/10/2010   Type 2 diabetes mellitus with hyperlipidemia (HCC) 09/19/2007   Iron  deficiency anemia due to chronic blood loss 03/13/2007   GERD 03/13/2007   Essential hypertension 03/05/2007   DISEASE, CEREBROVASCULAR NEC 03/05/2007   PROSTATE CANCER, HX OF 03/05/2007    ONSET DATE: 04/25/24  REFERRING DIAG: I63.9 (ICD-10-CM) - Acute CVA (cerebrovascular accident) (HCC) R26.81 (ICD-10-CM) - Gait instability  THERAPY DIAG:  Muscle weakness (generalized)  Difficulty in walking, not elsewhere classified  Unsteadiness on feet  Other symptoms and signs involving the nervous system  Rationale for Evaluation and Treatment: Rehabilitation  SUBJECTIVE:                                                                                                                                                                                             SUBJECTIVE STATEMENT: Feel pretty good. Using a cane sometimes for counter balance and was using this prior to hospitalization very little. Reports that the doctors decided I had a mild stroke. Reports that he was bumping into things on his L side but reports that he can see out of the L eye. Denies weakness, dizziness, diplopia, sensation changes. Reports that in 2024 he had COVID and pneumonia and had to relearn to walk. Admits to not moving as much as I should.   Pt accompanied by: significant other and in waiting room  PERTINENT HISTORY: CHF, carotid artery stenosis, CAD, DMII, HLD, HTN, prostate CA, L AV fistula Dialysis: M, W, F  PAIN:  Are you having pain? No  PRECAUTIONS: Fall and Other: L AV fistula- no L UE BP   RED FLAGS: None   WEIGHT BEARING RESTRICTIONS:  No  FALLS: Has patient fallen in last 6 months? No  LIVING ENVIRONMENT: Lives with: lives with their spouse Lives in: House/apartment Stairs: 3-4 steps to enter with railing; 2 story home Has following equipment at home: Vannie - 2 wheeled, Environmental consultant - 4 wheeled, Wheelchair (manual), and  quad tip cane  PLOF: Independent with basic ADLs and wife was assisting with shower transfers, cooking, cleaning   PATIENT GOALS: maybe a little balance  OBJECTIVE:  Note: Objective measures were completed at Evaluation unless otherwise noted.  DIAGNOSTIC FINDINGS: 04/25/24 brain MRI: Acute infarct in the right PCA territory as above. 2. 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 3. Additional remote lacunar infarcts in the left thalamus. 4. Focus of signal abnormality and susceptibility within the left parietal lobe, possibly representing remote hemorrhage or vascular malformation such as cavernous malformation. Additional similar focus of signal abnormality and susceptibility along the right dorsal aspect of the pons.  COGNITION: Overall cognitive status: difficult to discern; requires increased instruction for tasks occasionally    SENSATION: Pt reports N/T in UEs  COORDINATION: Alternating pronation/supination: slight dysmetria B Alternating toe tap: hypokinesia L>R Finger to nose: WNL B  MUSCLE TONE: WNL B LE  POSTURE: rounded shoulders, forward head, and shoulders elevations  LOWER EXTREMITY ROM:     Active  Right Eval Left Eval  Hip flexion    Hip extension    Hip abduction    Hip adduction    Hip internal rotation    Hip external rotation    Knee flexion    Knee extension    Ankle dorsiflexion 8 16  Ankle plantarflexion    Ankle inversion    Ankle eversion     (Blank rows = not tested)  LOWER EXTREMITY MMT:    MMT (in sitting) Right Eval Left Eval  Hip flexion 4+ 4+  Hip extension    Hip abduction 4- 4-  Hip adduction 4- 4-   Hip internal rotation    Hip external rotation    Knee flexion 3+ 3+  Knee extension 4+ 4  Ankle dorsiflexion 4 4  Ankle plantarflexion 4 4  Ankle inversion    Ankle eversion    (Blank rows = not tested)  GAIT: Findings: Assistive device utilized:quad tip cane, Level of assistance: SBA and CGA, and Comments: limited foot clearance B and tendency to shuffle feet; slowed *pt requires increased direction to navigate clinic environment; suspect vision vs. Cognition as a factor   FUNCTIONAL TESTS:  5 times sit to stand: 23.8 sec pushing off LEs without standing fully  10 meter walk test: 17.23 sec with cane (1.9 ft/sec)  [facility] PT Assessment - 05/23/24 0001       Standardized Balance Assessment   Standardized Balance Assessment Dynamic Gait Index      Dynamic Gait Index   Level Surface Moderate Impairment    Change in Gait Speed Moderate Impairment    Gait with Horizontal Head Turns Moderate Impairment    Gait with Vertical Head Turns Moderate Impairment    Gait and Pivot Turn Severe Impairment    Step Over Obstacle Moderate Impairment    Step Around Obstacles Moderate Impairment   required increased instruction to understand task   Steps Moderate Impairment    Total Score 7  TREATMENT DATE: 05/23/24    PATIENT EDUCATION: Education details: edu on benefits of OT for vision, prognosis, POC, exam findings as they relate for functional impairments  Person educated: Patient and Spouse Education method: Explanation Education comprehension: verbalized understanding  HOME EXERCISE PROGRAM: Not yet initiated    GOALS: Goals reviewed with patient? Yes  SHORT TERM GOALS: Target date: 06/13/2024  Patient to be independent with initial HEP. Baseline: HEP initiated Goal status: INITIAL    LONG TERM GOALS: Target date: 07/18/2024  Patient to  be independent with advanced HEP. Baseline: Not yet initiated  Goal status: INITIAL  to be assessed and goal created. Baseline: NT Goal status: INITIAL  Patient to score at least 15/24 on DGI in order to decrease risk of falls.  Baseline: 7 Goal status: INITIAL  Patient to demonstrate 5xSTS test in <15 sec in order to decrease risk of falls.  Baseline: 23 sec Goal status: INITIAL  Patient to demonstrate gait speed of at least 2.3 ft/sec in order to improve access to community.  Baseline: 1.9 ft/sec Goal status: INITIAL   ASSESSMENT:  CLINICAL IMPRESSION:  Patient is a 89 y/o M presenting to OPPT with c/o imbalance and reduced L sided awareness s/p hospitalization 04/25/24-04/28/24 for acute R PCA infarct and remote R basal ganglia infarct with evidence of prior hemorrhage.Patient today presenting with Impaired coordination, rounded/elevated shoulders, limited R ankle ROM, LE weakness, gait deviations, reduced gait and transfer speed, and imbalance. Patient's score on DGI indicates an increased risk of falls. Recommend OT referral d/t possible visual field deficits contributing to imbalance and falls risk. Would benefit from skilled PT services 1-2 x/week for 6 weeks to address aforementioned impairments in order to optimize level of function.    OBJECTIVE IMPAIRMENTS: Abnormal gait, decreased activity tolerance, decreased balance, decreased coordination, decreased endurance, difficulty walking, decreased ROM, decreased strength, decreased safety awareness, impaired flexibility, and postural dysfunction.   ACTIVITY LIMITATIONS: carrying, lifting, bending, sitting, standing, squatting, stairs, transfers, bed mobility, bathing, toileting, dressing, reach over head, hygiene/grooming, and locomotion level  PARTICIPATION LIMITATIONS: meal prep, cleaning, shopping, community activity, and church  PERSONAL FACTORS: Age, Fitness, Past/current experiences, Time since onset of  injury/illness/exacerbation, and 3+ comorbidities: CHF, carotid artery stenosis, CAD, DMII, HLD, HTN, prostate CA, L AV fistula are also affecting patient's functional outcome.   REHAB POTENTIAL: Good  CLINICAL DECISION MAKING: Evolving/moderate complexity  EVALUATION COMPLEXITY: Moderate  PLAN:  PT FREQUENCY: 1-2x/week  PT DURATION: 6 weeks  PLANNED INTERVENTIONS: [id number]- PT Re-evaluation, 97750- Physical Performance Testing, 97110-Therapeutic exercises, 97530- Therapeutic activity, [id number]- Neuromuscular re-education, 97535- Self Care, [id number]- Manual therapy, [id number]- Gait training, [phone]- Canalith repositioning, Patient/Family education, Balance training, Stair training, Taping, Vestibular training, DME instructions, Cryotherapy, and Moist heat  PLAN FOR NEXT SESSION: initiate HEP for LE strength and balance; f/u on OT order; and create goal    Referring diagnosis? I63.9 (ICD-10-CM) - Acute CVA (cerebrovascular accident) (HCC) R26.81 (ICD-10-CM) - Gait instability Treatment diagnosis? (if different than referring diagnosis)  Muscle weakness (generalized)  Difficulty in walking, not elsewhere classified  Unsteadiness on feet  Other symptoms and signs involving the nervous system  What was this (referring dx) caused by? []  Surgery []  Fall []  Ongoing issue []  Arthritis [x]  Other: __stroke__________  Laterality: []  Rt []  Lt [x]  Both  Check all possible CPT codes:  *CHOOSE 10 OR LESS*    See Planned Interventions listed in the Plan section of the Evaluation.    Louana Terrilyn Christians, PT, DPT 05/23/24 3:48  PM  [facility] at [facility] [address] [place], [address] [place], [place] [address] Phone # [phone] Fax # [phone]

## 2024-05-23 ENCOUNTER — Other Ambulatory Visit: Payer: Self-pay

## 2024-05-23 ENCOUNTER — Encounter: Payer: Self-pay | Admitting: Physical Therapy

## 2024-05-23 ENCOUNTER — Telehealth: Payer: Self-pay | Admitting: Physical Therapy

## 2024-05-23 ENCOUNTER — Ambulatory Visit: Attending: Adult Health | Admitting: Physical Therapy

## 2024-05-23 DIAGNOSIS — M6281 Muscle weakness (generalized): Secondary | ICD-10-CM | POA: Diagnosis not present

## 2024-05-23 DIAGNOSIS — R262 Difficulty in walking, not elsewhere classified: Secondary | ICD-10-CM | POA: Insufficient documentation

## 2024-05-23 DIAGNOSIS — I639 Cerebral infarction, unspecified: Secondary | ICD-10-CM | POA: Diagnosis not present

## 2024-05-23 DIAGNOSIS — R2681 Unsteadiness on feet: Secondary | ICD-10-CM | POA: Diagnosis not present

## 2024-05-23 DIAGNOSIS — R29818 Other symptoms and signs involving the nervous system: Secondary | ICD-10-CM | POA: Diagnosis not present

## 2024-05-23 DIAGNOSIS — R41842 Visuospatial deficit: Secondary | ICD-10-CM | POA: Diagnosis not present

## 2024-05-23 NOTE — Telephone Encounter (Signed)
 Austin [name],   [name] was evaluated in OPPT per your referral.  The patient would benefit from OT evaluation d/t visual deficits. If you agree, please place an order in [facility] in [facility] or fax the order to [phone].  Thank you,  Louana Terrilyn Christians, PT, DPT 05/23/24 3:52 PM   Outpatient Rehab at [facility] [address] [place], [address] [place], [place] [address] Phone # [phone] Fax # [phone]

## 2024-05-24 ENCOUNTER — Other Ambulatory Visit: Payer: Self-pay | Admitting: Adult Health

## 2024-05-24 DIAGNOSIS — H539 Unspecified visual disturbance: Secondary | ICD-10-CM

## 2024-05-26 ENCOUNTER — Encounter (HOSPITAL_COMMUNITY): Payer: Self-pay | Admitting: *Deleted

## 2024-05-26 ENCOUNTER — Emergency Department (HOSPITAL_COMMUNITY)
Admission: EM | Admit: 2024-05-26 | Discharge: 2024-05-27 | Disposition: A | Discharge status: Home / Self Care | Attending: Emergency Medicine | Admitting: Emergency Medicine

## 2024-05-26 ENCOUNTER — Other Ambulatory Visit: Payer: Self-pay

## 2024-05-26 ENCOUNTER — Emergency Department (HOSPITAL_COMMUNITY)

## 2024-05-26 DIAGNOSIS — Z992 Dependence on renal dialysis: Secondary | ICD-10-CM | POA: Diagnosis not present

## 2024-05-26 DIAGNOSIS — I509 Heart failure, unspecified: Secondary | ICD-10-CM | POA: Diagnosis not present

## 2024-05-26 DIAGNOSIS — E1122 Type 2 diabetes mellitus with diabetic chronic kidney disease: Secondary | ICD-10-CM | POA: Diagnosis not present

## 2024-05-26 DIAGNOSIS — I5031 Acute diastolic (congestive) heart failure: Secondary | ICD-10-CM | POA: Insufficient documentation

## 2024-05-26 DIAGNOSIS — Z7982 Long term (current) use of aspirin: Secondary | ICD-10-CM | POA: Insufficient documentation

## 2024-05-26 DIAGNOSIS — N186 End stage renal disease: Secondary | ICD-10-CM | POA: Insufficient documentation

## 2024-05-26 DIAGNOSIS — Z7901 Long term (current) use of anticoagulants: Secondary | ICD-10-CM | POA: Insufficient documentation

## 2024-05-26 DIAGNOSIS — I482 Chronic atrial fibrillation, unspecified: Secondary | ICD-10-CM | POA: Insufficient documentation

## 2024-05-26 DIAGNOSIS — I132 Hypertensive heart and chronic kidney disease with heart failure and with stage 5 chronic kidney disease, or end stage renal disease: Secondary | ICD-10-CM | POA: Diagnosis not present

## 2024-05-26 DIAGNOSIS — Z79899 Other long term (current) drug therapy: Secondary | ICD-10-CM | POA: Diagnosis not present

## 2024-05-26 DIAGNOSIS — Z8546 Personal history of malignant neoplasm of prostate: Secondary | ICD-10-CM | POA: Insufficient documentation

## 2024-05-26 DIAGNOSIS — I2510 Atherosclerotic heart disease of native coronary artery without angina pectoris: Secondary | ICD-10-CM | POA: Insufficient documentation

## 2024-05-26 DIAGNOSIS — R0602 Shortness of breath: Secondary | ICD-10-CM | POA: Insufficient documentation

## 2024-05-26 DIAGNOSIS — I110 Hypertensive heart disease with heart failure: Secondary | ICD-10-CM | POA: Diagnosis not present

## 2024-05-26 DIAGNOSIS — I517 Cardiomegaly: Secondary | ICD-10-CM | POA: Diagnosis not present

## 2024-05-26 DIAGNOSIS — R911 Solitary pulmonary nodule: Secondary | ICD-10-CM | POA: Diagnosis not present

## 2024-05-26 DIAGNOSIS — R0989 Other specified symptoms and signs involving the circulatory and respiratory systems: Secondary | ICD-10-CM | POA: Diagnosis not present

## 2024-05-26 LAB — BASIC METABOLIC PANEL WITH GFR
Anion gap: 18 — ABNORMAL HIGH (ref 5–15)
BUN: 50 mg/dL — ABNORMAL HIGH (ref 8–23)
CO2: 21 mmol/L — ABNORMAL LOW (ref 22–32)
Calcium: 9.1 mg/dL (ref 8.9–10.3)
Chloride: 98 mmol/L (ref 98–111)
Creatinine, Ser: 9.78 mg/dL — ABNORMAL HIGH (ref 0.61–1.24)
GFR, Estimated: 5 mL/min — ABNORMAL LOW (ref >60)
Glucose, Bld: 157 mg/dL — ABNORMAL HIGH (ref 70–99)
Potassium: 4.1 mmol/L (ref 3.5–5.1)
Sodium: 137 mmol/L (ref 135–145)

## 2024-05-26 LAB — CBC
HCT: 31.6 % — ABNORMAL LOW (ref 39.0–52.0)
Hemoglobin: 9.7 g/dL — ABNORMAL LOW (ref 13.0–17.0)
MCH: 28.1 pg (ref 26.0–34.0)
MCHC: 30.7 g/dL (ref 30.0–36.0)
MCV: 91.6 fL (ref 80.0–100.0)
Platelets: 333 K/uL (ref 150–400)
RBC: 3.45 MIL/uL — ABNORMAL LOW (ref 4.22–5.81)
RDW: 17.3 % — ABNORMAL HIGH (ref 11.5–15.5)
WBC: 7.5 K/uL (ref 4.0–10.5)
nRBC: 0 % (ref 0.0–0.2)

## 2024-05-26 LAB — BRAIN NATRIURETIC PEPTIDE: B Natriuretic Peptide: 3258.2 pg/mL — ABNORMAL HIGH (ref 0.0–100.0)

## 2024-05-26 LAB — TROPONIN I (HIGH SENSITIVITY): Troponin I (High Sensitivity): 79 ng/L — ABNORMAL HIGH (ref <18)

## 2024-05-26 NOTE — ED Triage Notes (Signed)
 The pt is dialysis fistula lt arm  he was last dialyzed Friday bhs due tomorrow

## 2024-05-27 LAB — TROPONIN I (HIGH SENSITIVITY): Troponin I (High Sensitivity): 86 ng/L — ABNORMAL HIGH (ref <18)

## 2024-05-27 MED ORDER — IPRATROPIUM-ALBUTEROL 0.5-2.5 (3) MG/3ML IN SOLN
3.0000 mL | Freq: Once | RESPIRATORY_TRACT | Status: AC
  Administered 2024-05-27: 3 mL via RESPIRATORY_TRACT
  Filled 2024-05-27: qty 3

## 2024-05-27 MED ORDER — ALBUTEROL SULFATE HFA 108 (90 BASE) MCG/ACT IN AERS
1.0000 | INHALATION_SPRAY | Freq: Once | RESPIRATORY_TRACT | Status: AC
Start: 2024-05-27 — End: 2024-05-27
  Administered 2024-05-27: 1 via RESPIRATORY_TRACT
  Filled 2024-05-27: qty 6.7

## 2024-05-27 MED ORDER — APIXABAN 2.5 MG PO TABS
2.5000 mg | ORAL_TABLET | Freq: Two times a day (BID) | ORAL | Status: DC
  Administered 2024-05-27: 2.5 mg via ORAL
  Filled 2024-05-27: qty 1

## 2024-05-27 MED ORDER — AEROCHAMBER PLUS FLO-VU LARGE MISC
1.0000 | Freq: Once | Status: AC
Start: 2024-05-27 — End: 2024-05-27
  Administered 2024-05-27: 1

## 2024-05-27 NOTE — ED Provider Notes (Signed)
 [place] EMERGENCY DEPARTMENT AT [facility] Provider Note  CSN: [id number] Arrival date & time: 05/26/24 2119  Chief Complaint(s) Shortness of Breath  HPI Austin [name] is a 89 y.o. male with a past medical history listed below including hypertension, type 2 diabetes, diastolic heart failure with a last EF of 55% 1 month ago, ESRD on dialysis MWF here for several days of gradually worsening shortness of breath.  Patient reports using his home oxygen  with minimal relief.  He denies any fevers or chills.  Endorses cough and attempt to clear his throat that is nonproductive.  No chest pain.  No peripheral edema.  No increased abdominal girth.  Patient has been compliant with medications and is not missed any dialysis.   Shortness of Breath   Past Medical History Past Medical History:  Diagnosis Date   Acute diastolic CHF (congestive heart failure) (HCC) 09/17/2020   Acute exacerbation of CHF (congestive heart failure) (HCC) 08/04/2019   Acute right PCA stroke (HCC) 04/25/2024   ANEMIA DUE TO CHRONIC BLOOD LOSS 03/13/2007   CAROTID ARTERY STENOSIS 05/10/2010   Coronary artery disease    DIABETES MELLITUS, TYPE II 09/19/2007   DISEASE, CEREBROVASCULAR NEC 03/05/2007   ESRD (end stage renal disease) on dialysis (HCC)    GERD 03/13/2007   HYPERLIPIDEMIA 03/05/2007   HYPERTENSION 03/05/2007   HYPOKALEMIA 11/09/2009   KNEE PAIN, RIGHT 11/09/2009   New onset a-fib (HCC) 04/27/2024   Eliquis    PROSTATE CANCER, HX OF 03/05/2007   Patient Active Problem List   Diagnosis Date Noted   New onset atrial fibrillation (HCC) 04/27/2024   Acute CVA (cerebrovascular accident) (HCC) 04/25/2024   Lumbar facet arthropathy 04/16/2024   Secondary hyperparathyroidism of renal origin 06/02/2023   Preoperative evaluation of a medical condition to rule out surgical contraindications (TAR required) 05/04/2023   Right carpal tunnel syndrome 03/16/2023   Coagulation defect, unspecified 11/07/2022    Personal history of nicotine dependence 11/01/2022   ESRD on dialysis (HCC) 10/29/2022   Protein-calorie malnutrition, severe 10/28/2022   Anemia in chronic kidney disease 10/28/2022   Hyperlipidemia, unspecified 10/28/2022   Pneumonia due to COVID-19 virus 10/25/2022   CKD (chronic kidney disease) stage 5, GFR less than 15 ml/min (HCC) 10/25/2022   Severe pulmonary hypertension (HCC) 10/25/2022   Moderate aortic regurgitation 10/25/2022   CAD in native artery 10/25/2022   UTI (urinary tract infection) 09/27/2021   CHF exacerbation (HCC) 09/26/2021   Acute respiratory failure with hypoxia (HCC) 09/26/2021   Acute on chronic diastolic CHF (congestive heart failure) (HCC) 07/09/2021   Dyspnea 06/01/2021   Upper airway cough syndrome 04/06/2021   Porokeratosis 03/26/2021   Pulmonary nodules 10/22/2020   Acute on chronic renal failure 10/09/2020   Mass of right lung 09/18/2020   Acute kidney injury superimposed on CKD 08/04/2019   Elevated troponin 08/04/2019   Hypokalemia 08/04/2019   Hypertensive urgency 03/28/2019   Elevated serum immunoglobulin free light chains 02/20/2019   Gastrointestinal hemorrhage with melena    Melena 11/03/2016   Carotid artery stenosis 05/10/2010   Type 2 diabetes mellitus with hyperlipidemia (HCC) 09/19/2007   Iron  deficiency anemia due to chronic blood loss 03/13/2007   GERD 03/13/2007   Essential hypertension 03/05/2007   DISEASE, CEREBROVASCULAR NEC 03/05/2007   PROSTATE CANCER, HX OF 03/05/2007   Home Medication(s) Prior to Admission medications   Medication Sig Start Date End Date Taking? Authorizing Provider  Accu-Chek Softclix Lancets lancets USED TO CHECK BLOOD GLUCOSE TWICE A DAY OR AS NEEDED  06/08/23   Nafziger, Darleene, NP  acetaminophen  (TYLENOL ) 500 MG tablet Take 1,000 mg by mouth every 4 (four) hours as needed.    [provider]  albuterol  (VENTOLIN  HFA) 108 (90 Base) MCG/ACT inhaler TAKE 2 PUFFS BY MOUTH EVERY 6 HOURS AS  NEEDED FOR WHEEZE OR SHORTNESS OF BREATH 09/13/22   Shelah Lamar RAMAN, MD  amLODipine  (NORVASC ) 10 MG tablet TAKE 1 TABLET BY MOUTH EVERY DAY 12/05/23   Nafziger, Darleene, NP  apixaban  (ELIQUIS ) 2.5 MG TABS tablet Take 1 tablet (2.5 mg total) by mouth 2 (two) times daily. 04/27/24   Arlice Reichert, MD  aspirin  EC 81 MG tablet Take 81 mg by mouth daily. Swallow whole.    [provider]  atorvastatin  (LIPITOR) 20 MG tablet TAKE 1 TABLET BY MOUTH EVERY DAY 03/26/24   Nafziger, Darleene, NP  cetirizine  (ZYRTEC ) 5 MG tablet Take 1 tablet (5 mg total) by mouth at bedtime. 03/15/24   Rising, Asberry, PA-C  cholecalciferol  (VITAMIN D3) 25 MCG (1000 UNIT) tablet Take 1,000 Units by mouth daily.    [provider]  cloNIDine  (CATAPRES ) 0.2 MG tablet Take 1 tablet (0.2 mg total) by mouth daily. 05/02/24   Tolia, Sunit, DO  doxazosin  (CARDURA ) 2 MG tablet Take 2 mg by mouth at bedtime.  12/28/17   [provider]  esomeprazole  (NEXIUM ) 40 MG capsule TAKE 1 CAPSULE BY MOUTH EVERY DAY 03/07/24   Nafziger, Darleene, NP  fluticasone  (FLONASE ) 50 MCG/ACT nasal spray Place 1 spray into both nostrils daily. 03/15/24   Rising, Rebecca, PA-C  glucose blood (ACCU-CHEK GUIDE) test strip USE TO CHECK BLOOD GLUCOSE TWICE A DAY OR AS NEEDED 04/27/23   Nafziger, Darleene, NP  guaiFENesin  (MUCINEX ) 600 MG 12 hr tablet Take 600 mg by mouth at bedtime.    [provider]  isosorbide -hydrALAZINE  (BIDIL ) 20-37.5 MG tablet Take 1 tablet by mouth 3 (three) times daily. 04/27/24   Arlice Reichert, MD  ketoconazole  (NIZORAL ) 2 % cream Apply 1 Application topically 2 (two) times daily. 04/04/24   McDonald, Juliene SAUNDERS, DPM  lactulose  (CHRONULAC ) 10 GM/15ML solution TAKE 30 ML BY MOUTH TWICE DAILY AS NEEDED 04/24/24   Nafziger, Darleene, NP  Melatonin 10 MG TABS Take 10 mg by mouth at bedtime.    [provider]  Methoxy PEG-Epoetin  Beta (MIRCERA IJ) Mircera 10/30/23 10/28/24  [provider]  methylcellulose (CITRUCEL) oral powder  1 tablespoon every 2-3 days,alternating with lactulose     [provider]  Multiple Vitamins-Minerals (PRESERVISION AREDS 2) CAPS Take 1 capsule by mouth daily. 01/29/24   [provider]  Propylene Glycol (SYSTANE BALANCE OP) Place 1 drop into both eyes at bedtime.    [provider]  sevelamer  carbonate (RENVELA ) 800 MG tablet Take 800 mg by mouth 3 (three) times daily. 12/08/22   [provider]  Allergies Aspirin   Review of Systems Review of Systems  Respiratory:  Positive for shortness of breath.    As noted in HPI  Physical Exam Vital Signs  I have reviewed the triage vital signs BP (!) 145/63   Pulse 78   Temp 98 F (36.7 C)   Resp 13   Ht 5' 7 (1.702 m)   Wt 82.3 kg   SpO2 96%   BMI 28.42 kg/m   Physical Exam Vitals reviewed.  Constitutional:      General: He is not in acute distress.    Appearance: He is well-developed. He is not diaphoretic.  HENT:     Head: Normocephalic and atraumatic.     Nose: Nose normal.  Eyes:     General: No scleral icterus.       Right eye: No discharge.        Left eye: No discharge.     Conjunctiva/sclera: Conjunctivae normal.     Pupils: Pupils are equal, round, and reactive to light.  Neck:     Vascular: No JVD.  Cardiovascular:     Rate and Rhythm: Normal rate and regular rhythm.     Heart sounds: No murmur heard.    No friction rub. No gallop.  Pulmonary:     Effort: Pulmonary effort is normal. No respiratory distress.     Breath sounds: Normal breath sounds. No stridor. No wheezing, rhonchi or rales.  Abdominal:     General: There is no distension.     Palpations: Abdomen is soft.     Tenderness: There is no abdominal tenderness.  Musculoskeletal:        General: No tenderness.     Cervical back: Normal range of motion and neck supple.     Right lower leg:  No edema.     Left lower leg: No edema.  Skin:    General: Skin is warm and dry.     Findings: No erythema or rash.  Neurological:     Mental Status: He is alert and oriented to person, place, and time.     ED Results and Treatments Labs (all labs ordered are listed, but only abnormal results are displayed) Labs Reviewed  BASIC METABOLIC PANEL WITH GFR - Abnormal; Notable for the following components:      Result Value   CO2 21 (*)    Glucose, Bld 157 (*)    BUN 50 (*)    Creatinine, Ser 9.78 (*)    GFR, Estimated 5 (*)    Anion gap 18 (*)    All other components within normal limits  CBC - Abnormal; Notable for the following components:   RBC 3.45 (*)    Hemoglobin 9.7 (*)    HCT 31.6 (*)    RDW 17.3 (*)    All other components within normal limits  BRAIN NATRIURETIC PEPTIDE - Abnormal; Notable for the following components:   B Natriuretic Peptide 3,258.2 (*)    All other components within normal limits  TROPONIN I (HIGH SENSITIVITY) - Abnormal; Notable for the following components:   Troponin I (High Sensitivity) 79 (*)    All other components within normal limits  TROPONIN I (HIGH SENSITIVITY) - Abnormal; Notable for the following components:   Troponin I (High Sensitivity) 86 (*)    All other components within normal limits  EKG  EKG Interpretation Date/Time:  Sunday May 26 2024 21:34:08 EDT Ventricular Rate:  69 PR Interval:  216 QRS Duration:  88 QT Interval:  438 QTC Calculation: 469 R Axis:   -41  Text Interpretation: Sinus rhythm with marked sinus arrhythmia with 1st degree A-V block Left axis deviation ST & T wave abnormality, consider lateral ischemia Prolonged QT Abnormal ECG When compared with ECG of 02-May-2024 14:36, PREVIOUS ECG IS PRESENT Confirmed by Trine Likes [phone]) on 05/27/2024 3:05:23 AM       Radiology DG Chest  Portable 1 View Result Date: 05/26/2024 CLINICAL DATA:  Shortness of breath. EXAM: PORTABLE CHEST 1 VIEW COMPARISON:  Radiograph 03/22/2024, CT 03/28/2023 FINDINGS: Cardiomegaly is unchanged. Mediastinal contours are stable, aortic atherosclerosis. Slight vascular congestion without pulmonary edema. Streaky right suprahilar opacity, pulmonary nodule on prior imaging. No pleural effusion or pneumothorax. No acute airspace disease. No acute osseous findings. IMPRESSION: 1. Cardiomegaly with slight vascular congestion. 2. Streaky right suprahilar opacity, pulmonary nodule on prior imaging. Electronically Signed   By: Andrea Gasman M.D.   On: 05/26/2024 22:05    Medications Ordered in ED Medications  apixaban  (ELIQUIS ) tablet 2.5 mg (2.5 mg Oral Given 05/27/24 0035)  AeroChamber Plus Flo-Vu Large MISC 1 each (has no administration in time range)  albuterol  (VENTOLIN  HFA) 108 (90 Base) MCG/ACT inhaler 1 puff (has no administration in time range)  ipratropium-albuterol  (DUONEB) 0.5-2.5 (3) MG/3ML nebulizer solution 3 mL (3 mLs Nebulization Given 05/27/24 0035)   Procedures Procedures  (including critical care time) Medical Decision Making / ED Course   Medical Decision Making Amount and/or Complexity of Data Reviewed Labs: ordered. Decision-making details documented in ED Course. Radiology: ordered and independent interpretation performed. Decision-making details documented in ED Course. ECG/medicine tests: ordered and independent interpretation performed. Decision-making details documented in ED Course.  Risk Prescription drug management. Decision regarding hospitalization.    Gradual onset shortness of breath differential diagnosis considered.  Workup below.  Exam is not concerning for significant volume overload.  Lungs are clear to auscultation bilaterally.  Chest x-ray without evidence of pneumonia, pneumothorax.  He does have cardiomegaly with pulmonary vascular congestion without  overt edema or effusions.  He is satting well on room air.  Labs without hyperkalemia requiring emergent dialysis.  Renal function is consistent with ESRD and close to his baseline.  BnP elevated as expected given he is ESRD.  EKG without acute ischemic changes.  Serial troponins elevated but flat.  Doubt ACS.  On review of records, patient has had CT of his chest notable for emphysema.  Patient was a prior smoker.  He was given a DuoNeb which did provide significant relief of his reported shortness of breath.  He was able to ambulate on room air and maintain sats of 98 and above.  Patient provided with albuterol  inhaler.   Also given night time dose of Eliquis .  Patient felt stable for discharge home.  He has dialysis scheduled for the morning.  Recommend he go to dialysis as scheduled.  PCP follow-up.    Final Clinical Impression(s) / ED Diagnoses Final diagnoses:  Shortness of breath   The patient appears reasonably screened and/or stabilized for discharge and I doubt any other medical condition or other [facility] requiring further screening, evaluation, or treatment in the ED at this time. I have discussed the findings, Dx and Tx plan with the patient/family who expressed understanding and agree(s) with the plan. Discharge instructions discussed at length. The patient/family was given strict return precautions  who verbalized understanding of the instructions. No further questions at time of discharge.  Disposition: Discharge  Condition: Good  ED Discharge Orders     None         Follow Up: Merna Huxley, NP [address] [place] [address] [phone]  Call  to schedule an appointment for close follow up    This chart was dictated using voice recognition software.  Despite best efforts to proofread,  errors can occur which can change the documentation meaning.    Trine Raynell Moder, MD 05/27/24 [phone]

## 2024-05-27 NOTE — Discharge Instructions (Addendum)
Albuterol inhaler: 2 puffs every 4-6 hours if needed for wheezing or shortness of breath.

## 2024-05-27 NOTE — ED Notes (Signed)
 Patient verbalizes understanding of discharge instructions. Opportunity for questioning and answers were provided. Armband removed by staff, pt discharged from ED. Wheeled out to lobby with wife, awaiting granddaughter to drive them home

## 2024-05-28 ENCOUNTER — Ambulatory Visit: Admitting: Adult Health

## 2024-05-28 ENCOUNTER — Encounter: Payer: Self-pay | Admitting: Adult Health

## 2024-05-28 VITALS — BP 122/48 | HR 73 | Ht 67.0 in | Wt 176.0 lb

## 2024-05-28 DIAGNOSIS — I63431 Cerebral infarction due to embolism of right posterior cerebral artery: Secondary | ICD-10-CM | POA: Diagnosis not present

## 2024-05-28 DIAGNOSIS — Z09 Encounter for follow-up examination after completed treatment for conditions other than malignant neoplasm: Secondary | ICD-10-CM | POA: Diagnosis not present

## 2024-05-28 DIAGNOSIS — H53462 Homonymous bilateral field defects, left side: Secondary | ICD-10-CM | POA: Diagnosis not present

## 2024-05-28 DIAGNOSIS — R2689 Other abnormalities of gait and mobility: Secondary | ICD-10-CM

## 2024-05-28 DIAGNOSIS — I4891 Unspecified atrial fibrillation: Secondary | ICD-10-CM | POA: Diagnosis not present

## 2024-05-28 NOTE — Patient Instructions (Signed)
 Continue working with physical therapy for hopeful ongoing recovery  Follow up with your eye doctor this afternoon - please have them send us  the results for review  Continue Eliquis   and atorvastatin   for secondary stroke prevention  Continue to follow up with PCP regarding blood pressure, cholesterol and diabetes management  Maintain strict control of hypertension with blood pressure goal below 130/90, diabetes with hemoglobin A1c goal below 7.0 % and cholesterol with LDL cholesterol (bad cholesterol) goal below 70 mg/dL.   Signs of a Stroke? Follow the BEFAST method:  Balance Watch for a sudden loss of balance, trouble with coordination or vertigo Eyes Is there a sudden loss of vision in one or both eyes? Or double vision?  Face: Ask the person to smile. Does one side of the face droop or is it numb?  Arms: Ask the person to raise both arms. Does one arm drift downward? Is there weakness or numbness of a leg? Speech: Ask the person to repeat a simple phrase. Does the speech sound slurred/strange? Is the person confused ? Time: If you observe any of these signs, call [phone].       Thank you for coming to see us  at [facility]. I hope we have been able to provide you high quality care today.  You may receive a patient satisfaction survey over the next few weeks. We would appreciate your feedback and comments so that we may continue to improve ourselves and the health of our patients.

## 2024-05-28 NOTE — Progress Notes (Signed)
 [facility] [address] [place]. [place] [address] [phone]       HOSPITAL FOLLOW UP NOTE  [name] Date of Birth:  09/01/1934 Medical Record Number:  [id number]   Reason for Referral:  hospital stroke follow up    SUBJECTIVE:   CHIEF COMPLAINT:  Chief Complaint  Patient presents with   Cerebrovascular Accident    Rm 8 with spouse Pt is well, reports occasional blurred vision of L eye. No other concerns.     HPI:   [name] is a 89 y.o. male with history of veteran of the [place]  [employer], with hx of ESRD on dialysis, CHF, carotid artery stenosis, GERDdiabetes, hypertension, hyperlipidemia, prior stroke with not much in terms of residual deficits, who presented to the emergency department on 04/25/2024 for evaluation of visual disturbance-which he reports his vision getting dimmed for the last 3 to 4 days.  Stroke workup revealed scattered right PCA infarcts likely due to large vessel disease with severe proximal P2 stenosis possibly in setting of BP fluctuation with HD.  Further workup as noted below.  Initially recommended DAPT for 3 months and Plavix  alone but was found to be in A-fib on EKG and was started on Eliquis  2.5 mg twice daily and continued atorvastatin  20 mg daily.  Therapies recommended home health therapy.  Residual left hemianopia and advised follow-up with ophthalmology for driving clearance.   Today, 05/28/2024, patient is being seen for initial hospital follow-up accompanied by his wife. Reports overall doing well since discharge, denies new stroke/TIA symptoms. Reports intermittent left sided blurred vision and mild imbalance.  Recently evaluated by PT and plans on scheduling consecutive visits.  He ambulates with a cane which he was using previously.  He has follow-up visit with ophthalmology this afternoon.  He remains on Eliquis  and atorvastatin  without side effects.  He also reports intermittent use of aspirin .  Blood pressure  initially elevated but on recheck, lower side of normal, asymptomatic.  Blood pressure routinely monitored during HD sessions M/W/F. He does not monitor at home.  Routinely follows with PCP, nephrology and cardiology.  No further questions or concerns at this time.      PERTINENT IMAGING  CT head No acute abnormality. Small vessel disease.  CTA head & neck severe stenosis proximal right P2, moderate stenosis right siphon and moderate to severe stenosis left siphon.   mild to moderate stenosis of bilateral cervical VAs, moderate left VA origin and mild to moderate left V4 stenosis. MRI  1. Acute infarct in the right PCA territory as above. 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 Additional remote lacunar infarcts in the left thalamus. Focus of signal abnormality and susceptibility within the left parietal lobe, possibly representing remote hemorrhage or vascular malformation such as cavernous malformation. Additional similar focus of signal abnormality and susceptibility along the right dorsal aspect of the pons. 2D Echo  EF of 55% with grade 1 diastolic dysfunction, left atrium is severely dilated LDL 53 HgbA1c 7.6    ROS:   14 system review of systems performed and negative with exception of those listed in HPI  PMH:  Past Medical History:  Diagnosis Date   Acute diastolic CHF (congestive heart failure) (HCC) 09/17/2020   Acute exacerbation of CHF (congestive heart failure) (HCC) 08/04/2019   Acute right PCA stroke (HCC) 04/25/2024   ANEMIA DUE TO CHRONIC BLOOD LOSS 03/13/2007   CAROTID ARTERY STENOSIS 05/10/2010   Coronary artery disease  DIABETES MELLITUS, TYPE II 09/19/2007   DISEASE, CEREBROVASCULAR NEC 03/05/2007   ESRD (end stage renal disease) on dialysis [facility])    GERD 03/13/2007   HYPERLIPIDEMIA 03/05/2007   HYPERTENSION 03/05/2007   HYPOKALEMIA 11/09/2009   KNEE PAIN, RIGHT 11/09/2009   New onset a-fib (HCC) 04/27/2024   Eliquis     PROSTATE CANCER, HX OF 03/05/2007    PSH:  Past Surgical History:  Procedure Laterality Date   A/V FISTULAGRAM N/A 03/21/2024   Procedure: A/V Fistulagram;  Surgeon: Magda Debby SAILOR, MD;  Location: [facility];  Service: Cardiovascular;  Laterality: N/A;   AV FISTULA PLACEMENT Left 10/18/2022   Procedure: INSERTION OF LEFT ARM BRACHIAL ARTERY TO AXILLARY VEIN ARTERIOVENOUS (AV) GORE-TEX GRAFT;  Surgeon: Lanis Fonda BRAVO, MD;  Location: [facility] OR;  Service: Vascular;  Laterality: Left;   CAROTID ARTERY ANGIOPLASTY Right Oct. 10, 2001   ESOPHAGOGASTRODUODENOSCOPY (EGD) WITH PROPOFOL  N/A 11/04/2016   Procedure: ESOPHAGOGASTRODUODENOSCOPY (EGD) WITH PROPOFOL ;  Surgeon: Layla Lah, MD;  Location: [facility];  Service: Gastroenterology;  Laterality: N/A;   IR FLUORO GUIDE CV LINE RIGHT  10/25/2022   IR FLUORO GUIDE CV LINE RIGHT  10/27/2022   IR US  GUIDE VASC ACCESS RIGHT  10/25/2022   IR US  GUIDE VASC ACCESS RIGHT  10/27/2022   LAPAROSCOPIC APPENDECTOMY  02/20/2012   Procedure: APPENDECTOMY LAPAROSCOPIC;  Surgeon: Jina Nephew, MD;  Location: [facility] OR;  Service: General;  Laterality: N/A;   PROSTATE SURGERY     prostatectomy    Social History:  Social History   Socioeconomic History   Marital status: Married    Spouse name: Not on file   Number of children: Not on file   Years of education: Not on file   Highest education level: Not on file  Occupational History   Not on file  Tobacco Use   Smoking status: Former    Current packs/day: 0.00    Average packs/day: 1 pack/day for 10.0 years (10.0 ttl pk-yrs)    Types: Cigarettes    Start date: 08/22/1964    Quit date: 08/22/1974    Years since quitting: 49.8   Smokeless tobacco: Never  Vaping Use   Vaping status: Never Used  Substance and Sexual Activity   Alcohol  use: No    Alcohol /week: 0.0 standard drinks of alcohol    Drug use: No   Sexual activity: Not Currently  Other Topics Concern   Not on file  Social History Narrative    Retired - Administrator, arts    Married 53 years       He enjoys traveling    Social Drivers of Corporate investment banker Strain: Low Risk  (02/09/2023)   Overall Financial Resource Strain (CARDIA)    Difficulty of Paying Living Expenses: Not very hard  Food Insecurity: No Food Insecurity (04/26/2024)   Hunger Vital Sign    Worried About Running Out of Food in the Last Year: Never true    Ran Out of Food in the Last Year: Never true  Transportation Needs: No Transportation Needs (04/26/2024)   PRAPARE - Administrator, Civil Service (Medical): No    Lack of Transportation (Non-Medical): No  Physical Activity: Insufficiently Active (02/09/2023)   Exercise Vital Sign    Days of Exercise per Week: 3 days    Minutes of Exercise per Session: 30 min  Stress: No Stress Concern Present (02/09/2023)   [employer] of Occupational Health - Occupational Stress Questionnaire    Feeling of Stress :  Not at all  Social Connections: Unknown (04/26/2024)   Social Connection and Isolation Panel    Frequency of Communication with Friends and Family: Patient declined    Frequency of Social Gatherings with Friends and Family: Patient declined    Attends Religious Services: Patient declined    Database administrator or Organizations: Not on file    Attends Banker Meetings: Patient declined    Marital Status: Married  Catering manager Violence: Not At Risk (04/26/2024)   Humiliation, Afraid, Rape, and Kick questionnaire    Fear of Current or Ex-Partner: No    Emotionally Abused: No    Physically Abused: No    Sexually Abused: No    Family History:  Family History  Problem Relation Age of Onset   Hypertension Mother    Cancer Father        Mesothelioma    Stomach cancer Brother    Cancer Brother    Cancer - Cervical Brother    Cancer Brother    Diabetes Brother    Esophageal cancer Neg Hx    Colon cancer Neg Hx    Pancreatic cancer Neg Hx     Medications:   Current  Outpatient Medications on File Prior to Visit  Medication Sig Dispense Refill   Accu-Chek Softclix Lancets lancets USED TO CHECK BLOOD GLUCOSE TWICE A DAY OR AS NEEDED 100 each 6   acetaminophen  (TYLENOL ) 500 MG tablet Take 1,000 mg by mouth every 4 (four) hours as needed.     albuterol  (VENTOLIN  HFA) 108 (90 Base) MCG/ACT inhaler TAKE 2 PUFFS BY MOUTH EVERY 6 HOURS AS NEEDED FOR WHEEZE OR SHORTNESS OF BREATH 8.5 each 5   amLODipine  (NORVASC ) 10 MG tablet TAKE 1 TABLET BY MOUTH EVERY DAY 90 tablet 3   apixaban  (ELIQUIS ) 2.5 MG TABS tablet Take 1 tablet (2.5 mg total) by mouth 2 (two) times daily. 60 tablet 11   atorvastatin  (LIPITOR) 20 MG tablet TAKE 1 TABLET BY MOUTH EVERY DAY 90 tablet 3   cetirizine  (ZYRTEC ) 5 MG tablet Take 1 tablet (5 mg total) by mouth at bedtime. 60 tablet 0   cholecalciferol  (VITAMIN D3) 25 MCG (1000 UNIT) tablet Take 1,000 Units by mouth daily.     cloNIDine  (CATAPRES ) 0.2 MG tablet Take 1 tablet (0.2 mg total) by mouth daily. 30 tablet 6   doxazosin  (CARDURA ) 2 MG tablet Take 2 mg by mouth at bedtime.   3   esomeprazole  (NEXIUM ) 40 MG capsule TAKE 1 CAPSULE BY MOUTH EVERY DAY 90 capsule 3   fluticasone  (FLONASE ) 50 MCG/ACT nasal spray Place 1 spray into both nostrils daily. 9.9 mL 2   glucose blood (ACCU-CHEK GUIDE) test strip USE TO CHECK BLOOD GLUCOSE TWICE A DAY OR AS NEEDED 300 strip 1   guaiFENesin  (MUCINEX ) 600 MG 12 hr tablet Take 600 mg by mouth at bedtime.     isosorbide -hydrALAZINE  (BIDIL ) 20-37.5 MG tablet Take 1 tablet by mouth 3 (three) times daily. 30 tablet 0   ketoconazole  (NIZORAL ) 2 % cream Apply 1 Application topically 2 (two) times daily. 60 g 2   lactulose  (CHRONULAC ) 10 GM/15ML solution TAKE 30 ML BY MOUTH TWICE DAILY AS NEEDED 237 mL 3   Melatonin 10 MG TABS Take 10 mg by mouth at bedtime.     Methoxy PEG-Epoetin  Beta (MIRCERA IJ) Mircera     methylcellulose (CITRUCEL) oral powder 1 tablespoon every 2-3 days,alternating with lactulose       Multiple Vitamins-Minerals (PRESERVISION AREDS 2) CAPS  Take 1 capsule by mouth daily.     Propylene Glycol (SYSTANE BALANCE OP) Place 1 drop into both eyes at bedtime.     sevelamer  carbonate (RENVELA ) 800 MG tablet Take 800 mg by mouth 3 (three) times daily.     No current facility-administered medications on file prior to visit.    Allergies:   Allergies  Allergen Reactions   Aspirin  Other (See Comments)    High doses causes stomach ulcer and bleeding      OBJECTIVE:  Physical Exam  Vitals:   05/28/24 1017 05/28/24 1037  BP: (!) 160/71 (!) 122/48  Pulse: 73   Weight: 176 lb (79.8 kg)   Height: 5' 7 (1.702 m)    Body mass index is 27.57 kg/m. No results found.  General: well developed, well nourished, very pleasant elderly African-American male, seated, in no evident distress Head: head normocephalic and atraumatic.   Neck: supple with no carotid or supraclavicular bruits Cardiovascular: regular rate and rhythm, no murmurs  Neurologic Exam Mental Status: Awake and fully alert.  Fluent speech and language.  Oriented to place and time. Recent and remote memory intact. Attention span, concentration and fund of knowledge appropriate. Mood and affect appropriate.  Cranial Nerves: Pupils equal, briskly reactive to light. Extraocular movements full without nystagmus. Visual fields left hemianopia. Hearing intact. Facial sensation intact. Face, tongue, palate moves normally and symmetrically.  Motor: Normal bulk and tone. Normal strength in all tested extremity muscles Sensory.: intact to touch , pinprick , position and vibratory sensation.  Coordination: Rapid alternating movements normal in all extremities. Finger-to-nose and heel-to-shin performed accurately bilaterally. Gait and Station: Arises from chair without difficulty. Stance is normal. Gait demonstrates decreased stride length and step height bilaterally with mild imbalance and use of cane.     NIHSS  2 Modified  Rankin  2-3      ASSESSMENT: Austin [name] is a 89 y.o. year old male with scattered right PCA infarcts on 04/25/2024 likely due to large vessel disease source in setting of proximal right P2 stenosis with BP fluctuation with HD vs new onset of A fib. Vascular risk factors include new dx of A fib, HTN, HLD, DM, carotid artery stenosis (monitored by [facility]), CHF, prior strokes, and ESRD on HD.      PLAN:  Right PCA strokes:  Residual deficit: Left homonymous hemianopsia and imbalance.  Continue working with PT and use of cane at all times unless otherwise instructed.  Follow-up with ophthalmology this afternoon, request notes be faxed to office for review.  Continue Eilquis 2.5mg  twice daily and atorvastatin  (Lipitor) for secondary stroke prevention managed/prescribed by PCP/cardiology.  Advised to discontinue aspirin  due to increased bleeding risk Encouraged routine monitoring of blood pressure at home and continue close follow-up with nephrology and cardiology Discussed secondary stroke prevention measures and importance of close PCP follow up for aggressive stroke risk factor management including BP goal<130/90, HLD with LDL goal<70 and DM with A1c.<7 .  Stroke labs 04/2024: LDL 53, A1c 7.6 I have gone over the pathophysiology of stroke, warning signs and symptoms, risk factors and their management in some detail with instructions to go to the closest emergency room for symptoms of concern.  New dx of A fib: Discovered during recent admission while working with OT Closely being followed by cardiology     No further recommendations from stroke standpoint and is closely being followed by PCP and other specialties.  He can follow-up here on an as-needed basis but advised to call  with any questions or concerns in the future.   CC:  [facility] provider: Dr. Rosemarie PCP: Merna Huxley, NP    I personally spent a total of 55 minutes in the care of the patient today including preparing to see the  patient, getting/reviewing separately obtained history, performing a medically appropriate exam/evaluation, counseling and educating, referring and communicating with other health care professionals, and documenting clinical information in the EHR.  Harlene Bogaert, AGNP-BC  [facility] [address] [place], [place] [address]  Phone [phone] Fax [phone] Note: This document was prepared with digital dictation and possible smart phrase technology. Any transcriptional errors that result from this process are unintentional.

## 2024-05-28 NOTE — Progress Notes (Signed)
 I agree with the above plan

## 2024-05-29 ENCOUNTER — Other Ambulatory Visit: Payer: Self-pay | Admitting: Adult Health

## 2024-05-29 DIAGNOSIS — N186 End stage renal disease: Secondary | ICD-10-CM | POA: Diagnosis not present

## 2024-05-29 DIAGNOSIS — Z992 Dependence on renal dialysis: Secondary | ICD-10-CM | POA: Diagnosis not present

## 2024-05-29 DIAGNOSIS — N2581 Secondary hyperparathyroidism of renal origin: Secondary | ICD-10-CM | POA: Diagnosis not present

## 2024-05-29 NOTE — Telephone Encounter (Unsigned)
 Copied from CRM #[id number]. Topic: Clinical - Medication Refill >> May 29, 2024  1:04 PM Savanna F wrote: Medication: apixaban  (ELIQUIS ) 2.5 MG TABS tablet  Please note, this was first prescribed while patient was in the hospital with a mini-stroke to prevent another stroke.  Has the patient contacted their pharmacy? Yes (Agent: If no, request that the patient contact the pharmacy for the refill. If patient does not wish to contact the pharmacy document the reason why and proceed with request.) (Agent: If yes, when and what did the pharmacy advise?)  This is the patient's preferred pharmacy:  [id number] - [place], [place] - [address] AT [address] OF [address] [place] [address] [address] Phone: [phone] Fax: [phone]  Is this the correct pharmacy for this prescription? Yes If no, delete pharmacy and type the correct one.   Has the prescription been filled recently? No  Is the patient out of the medication? Yes  Has the patient been seen for an appointment in the last year OR does the patient have an upcoming appointment? Yes  Can we respond through MyChart? Yes  Agent: Please be advised that Rx refills may take up to 3 business days. We ask that you follow-up with your pharmacy.

## 2024-05-30 ENCOUNTER — Ambulatory Visit: Admitting: Podiatry

## 2024-05-30 ENCOUNTER — Other Ambulatory Visit (HOSPITAL_COMMUNITY): Payer: Self-pay

## 2024-05-30 ENCOUNTER — Encounter: Payer: Self-pay | Admitting: Podiatry

## 2024-05-30 DIAGNOSIS — M79672 Pain in left foot: Secondary | ICD-10-CM | POA: Diagnosis not present

## 2024-05-30 DIAGNOSIS — B351 Tinea unguium: Secondary | ICD-10-CM

## 2024-05-30 DIAGNOSIS — M79671 Pain in right foot: Secondary | ICD-10-CM | POA: Diagnosis not present

## 2024-05-30 DIAGNOSIS — H5340 Unspecified visual field defects: Secondary | ICD-10-CM | POA: Diagnosis not present

## 2024-05-30 NOTE — Progress Notes (Signed)
 Patient presents for evaluation and treatment of tenderness and some redness around nails feet.  Tenderness around toes with walking and wearing shoes.  Physical exam:  General appearance: Alert, pleasant, and in no acute distress.  Vascular: Pedal pulses: DP 2/4 B/L, PT 0/4 B/L. Moderate edema lower legs bilaterally  Neuologic: Legs: There  Dermatologic:  Nails thickened, disfigured, discolored 1-5 BL with subungual debris.  Redness and hypertrophic nail folds along nail folds bilaterally but no signs of drainage or infection.  Musculoskeletal:     Diagnosis: 1. Painful onychomycotic nails 1 through 5 bilaterally. 2. Pain toes 1 through 5 bilaterally.  Plan: -Debrided onychomycotic nails 1 through 5 bilaterally.  Sharply debrided nails with nail clipper and reduced with a power bur.  Return 3 months [facility]

## 2024-05-31 DIAGNOSIS — N2581 Secondary hyperparathyroidism of renal origin: Secondary | ICD-10-CM | POA: Diagnosis not present

## 2024-05-31 DIAGNOSIS — Z992 Dependence on renal dialysis: Secondary | ICD-10-CM | POA: Diagnosis not present

## 2024-06-03 DIAGNOSIS — N186 End stage renal disease: Secondary | ICD-10-CM | POA: Diagnosis not present

## 2024-06-04 ENCOUNTER — Ambulatory Visit: Admitting: Adult Health

## 2024-06-04 ENCOUNTER — Inpatient Hospital Stay: Admitting: Adult Health

## 2024-06-04 ENCOUNTER — Ambulatory Visit
Admission: RE | Admit: 2024-06-04 | Discharge: 2024-06-04 | Disposition: A | Discharge status: Home / Self Care | Source: Ambulatory Visit | Attending: Emergency Medicine | Admitting: Emergency Medicine

## 2024-06-04 VITALS — BP 164/54 | HR 87 | Temp 98.1°F | Ht 67.0 in | Wt 175.4 lb

## 2024-06-04 DIAGNOSIS — R0602 Shortness of breath: Secondary | ICD-10-CM

## 2024-06-04 DIAGNOSIS — R052 Subacute cough: Secondary | ICD-10-CM

## 2024-06-04 DIAGNOSIS — R911 Solitary pulmonary nodule: Secondary | ICD-10-CM | POA: Diagnosis not present

## 2024-06-04 DIAGNOSIS — R918 Other nonspecific abnormal finding of lung field: Secondary | ICD-10-CM

## 2024-06-04 NOTE — Patient Instructions (Signed)
It was great seeing you today and I am glad you are feeling better.  ? ?Please let me know if you need anything  ?

## 2024-06-04 NOTE — Progress Notes (Signed)
 Subjective:    Patient ID: Austin [name], male    DOB: 1935/08/14, 89 y.o.   MRN: [id number]  HPI 89 year old male who  has a past medical history of Acute diastolic CHF (congestive heart failure) (HCC) (09/17/2020), Acute exacerbation of CHF (congestive heart failure) (HCC) (08/04/2019), Acute right PCA stroke (HCC) (04/25/2024), ANEMIA DUE TO CHRONIC BLOOD LOSS (03/13/2007), CAROTID ARTERY STENOSIS (05/10/2010), Coronary artery disease, DIABETES MELLITUS, TYPE II (09/19/2007), DISEASE, CEREBROVASCULAR NEC (03/05/2007), ESRD (end stage renal disease) on dialysis [facility]), GERD (03/13/2007), HYPERLIPIDEMIA (03/05/2007), HYPERTENSION (03/05/2007), HYPOKALEMIA (11/09/2009), KNEE PAIN, RIGHT (11/09/2009), New onset a-fib (HCC) (04/27/2024), and PROSTATE CANCER, HX OF (03/05/2007).  He presents to the office today for for follow up regarding shortness of breath. He reported several days of progressively worsening shortness of breath. He was using home oxygen  at home without relief. He did endorse a non productive cough in an attempt to clear his throat. He had not missed any dialysis. His most recent echo was a month earlier with a EF of 55%  In the ER his chest xray was negative for acute findings. Lab work did not show hyperkalemia, renal function consistent with ESRD. EKG without ischemic changes  He was given a nebulizer treatment which did provide relief and he was able to be discharged.   Today in the clinic he reports that he has not had any further SOB. He has not developed any fevers or chills. He still as a mild non productive cough but no other issues.     Review of Systems  Constitutional: Negative.   Respiratory:  Positive for cough. Negative for chest tightness, shortness of breath and stridor.   Cardiovascular: Negative.   Genitourinary: Negative.   Musculoskeletal:  Positive for arthralgias, back pain and gait problem.     Past Medical History:  Diagnosis Date   Acute diastolic  CHF (congestive heart failure) (HCC) 09/17/2020   Acute exacerbation of CHF (congestive heart failure) (HCC) 08/04/2019   Acute right PCA stroke (HCC) 04/25/2024   ANEMIA DUE TO CHRONIC BLOOD LOSS 03/13/2007   CAROTID ARTERY STENOSIS 05/10/2010   Coronary artery disease    DIABETES MELLITUS, TYPE II 09/19/2007   DISEASE, CEREBROVASCULAR NEC 03/05/2007   ESRD (end stage renal disease) on dialysis (HCC)    GERD 03/13/2007   HYPERLIPIDEMIA 03/05/2007   HYPERTENSION 03/05/2007   HYPOKALEMIA 11/09/2009   KNEE PAIN, RIGHT 11/09/2009   New onset a-fib (HCC) 04/27/2024   Eliquis    PROSTATE CANCER, HX OF 03/05/2007    Social History   Socioeconomic History   Marital status: Married    Spouse name: Not on file   Number of children: Not on file   Years of education: Not on file   Highest education level: Not on file  Occupational History   Not on file  Tobacco Use   Smoking status: Former    Current packs/day: 0.00    Average packs/day: 1 pack/day for 10.0 years (10.0 ttl pk-yrs)    Types: Cigarettes    Start date: 08/22/1964    Quit date: 08/22/1974    Years since quitting: 49.8   Smokeless tobacco: Never  Vaping Use   Vaping status: Never Used  Substance and Sexual Activity   Alcohol  use: No    Alcohol /week: 0.0 standard drinks of alcohol    Drug use: No   Sexual activity: Not Currently  Other Topics Concern   Not on file  Social History Narrative   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       He enjoys traveling    Social Drivers of Corporate investment banker Strain: Low Risk  (02/09/2023)   Overall Financial Resource Strain (CARDIA)    Difficulty of Paying Living Expenses: Not very hard  Food Insecurity: No Food Insecurity (04/26/2024)   Hunger Vital Sign    Worried About Running Out of Food in the Last Year: Never true    Ran Out of Food in the Last Year: Never true  Transportation Needs: No Transportation Needs (04/26/2024)   PRAPARE - Doctor, general practice (Medical): No    Lack of Transportation (Non-Medical): No  Physical Activity: Insufficiently Active (02/09/2023)   Exercise Vital Sign    Days of Exercise per Week: 3 days    Minutes of Exercise per Session: 30 min  Stress: No Stress Concern Present (02/09/2023)   [employer] of Occupational Health - Occupational Stress Questionnaire    Feeling of Stress : Not at all  Social Connections: Unknown (04/26/2024)   Social Connection and Isolation Panel    Frequency of Communication with Friends and Family: Patient declined    Frequency of Social Gatherings with Friends and Family: Patient declined    Attends Religious Services: Patient declined    Active Member of Clubs or Organizations: Not on file    Attends Banker Meetings: Patient declined    Marital Status: Married  Catering manager Violence: Not At Risk (04/26/2024)   Humiliation, Afraid, Rape, and Kick questionnaire    Fear of Current or Ex-Partner: No    Emotionally Abused: No    Physically Abused: No    Sexually Abused: No    Past Surgical History:  Procedure Laterality Date   A/V FISTULAGRAM N/A 03/21/2024   Procedure: A/V Fistulagram;  Surgeon: Magda Debby SAILOR, MD;  Location: [facility];  Service: Cardiovascular;  Laterality: N/A;   AV FISTULA PLACEMENT Left 10/18/2022   Procedure: INSERTION OF LEFT ARM BRACHIAL ARTERY TO AXILLARY VEIN ARTERIOVENOUS (AV) GORE-TEX GRAFT;  Surgeon: Lanis Fonda BRAVO, MD;  Location: [facility] OR;  Service: Vascular;  Laterality: Left;   CAROTID ARTERY ANGIOPLASTY Right Oct. 10, 2001   ESOPHAGOGASTRODUODENOSCOPY (EGD) WITH PROPOFOL  N/A 11/04/2016   Procedure: ESOPHAGOGASTRODUODENOSCOPY (EGD) WITH PROPOFOL ;  Surgeon: Layla Lah, MD;  Location: [facility];  Service: Gastroenterology;  Laterality: N/A;   IR FLUORO GUIDE CV LINE RIGHT  10/25/2022   IR FLUORO GUIDE CV LINE RIGHT  10/27/2022   IR US  GUIDE VASC ACCESS RIGHT  10/25/2022   IR US  GUIDE VASC ACCESS RIGHT  10/27/2022    LAPAROSCOPIC APPENDECTOMY  02/20/2012   Procedure: APPENDECTOMY LAPAROSCOPIC;  Surgeon: Jina Nephew, MD;  Location: [facility];  Service: General;  Laterality: N/A;   PROSTATE SURGERY     prostatectomy    Family History  Problem Relation Age of Onset   Hypertension Mother    Cancer Father        Mesothelioma    Stomach cancer Brother    Cancer Brother    Cancer - Cervical Brother    Cancer Brother    Diabetes Brother    Esophageal cancer Neg Hx    Colon cancer Neg Hx    Pancreatic cancer Neg Hx     Allergies  Allergen Reactions   Aspirin  Other (See Comments)    High doses causes stomach ulcer and bleeding    Current Outpatient Medications on File Prior to Visit  Medication Sig Dispense Refill  Accu-Chek Softclix Lancets lancets USED TO CHECK BLOOD GLUCOSE TWICE A DAY OR AS NEEDED 100 each 6   acetaminophen  (TYLENOL ) 500 MG tablet Take 1,000 mg by mouth every 4 (four) hours as needed.     albuterol  (VENTOLIN  HFA) 108 (90 Base) MCG/ACT inhaler TAKE 2 PUFFS BY MOUTH EVERY 6 HOURS AS NEEDED FOR WHEEZE OR SHORTNESS OF BREATH 8.5 each 5   amLODipine  (NORVASC ) 10 MG tablet TAKE 1 TABLET BY MOUTH EVERY DAY 90 tablet 3   apixaban  (ELIQUIS ) 2.5 MG TABS tablet Take 1 tablet (2.5 mg total) by mouth 2 (two) times daily. 60 tablet 11   atorvastatin  (LIPITOR) 20 MG tablet TAKE 1 TABLET BY MOUTH EVERY DAY 90 tablet 3   cetirizine  (ZYRTEC ) 5 MG tablet Take 1 tablet (5 mg total) by mouth at bedtime. 60 tablet 0   cholecalciferol  (VITAMIN D3) 25 MCG (1000 UNIT) tablet Take 1,000 Units by mouth daily.     cloNIDine  (CATAPRES ) 0.2 MG tablet Take 1 tablet (0.2 mg total) by mouth daily. 30 tablet 6   doxazosin  (CARDURA ) 2 MG tablet Take 2 mg by mouth at bedtime.   3   esomeprazole  (NEXIUM ) 40 MG capsule TAKE 1 CAPSULE BY MOUTH EVERY DAY 90 capsule 3   fluticasone  (FLONASE ) 50 MCG/ACT nasal spray Place 1 spray into both nostrils daily. 9.9 mL 2   glucose blood (ACCU-CHEK GUIDE) test strip USE TO CHECK  BLOOD GLUCOSE TWICE A DAY OR AS NEEDED 300 strip 1   guaiFENesin  (MUCINEX ) 600 MG 12 hr tablet Take 600 mg by mouth at bedtime.     isosorbide -hydrALAZINE  (BIDIL ) 20-37.5 MG tablet Take 1 tablet by mouth 3 (three) times daily. 30 tablet 0   ketoconazole  (NIZORAL ) 2 % cream Apply 1 Application topically 2 (two) times daily. 60 g 2   lactulose  (CHRONULAC ) 10 GM/15ML solution TAKE 30 ML BY MOUTH TWICE DAILY AS NEEDED 237 mL 3   Melatonin 10 MG TABS Take 10 mg by mouth at bedtime.     Methoxy PEG-Epoetin  Beta (MIRCERA IJ) Mircera     methylcellulose (CITRUCEL) oral powder 1 tablespoon every 2-3 days,alternating with lactulose      Multiple Vitamins-Minerals (PRESERVISION AREDS 2) CAPS Take 1 capsule by mouth daily.     Propylene Glycol (SYSTANE BALANCE OP) Place 1 drop into both eyes at bedtime.     sevelamer  carbonate (RENVELA ) 800 MG tablet Take 800 mg by mouth 3 (three) times daily.     No current facility-administered medications on file prior to visit.    BP (!) 164/54   Pulse 87   Temp 98.1 F (36.7 C) (Oral)   Ht 5' 7 (1.702 m)   Wt 175 lb 6.4 oz (79.6 kg)   SpO2 96%   BMI 27.47 kg/m       Objective:   Physical Exam Vitals and nursing note reviewed.  Constitutional:      Appearance: Normal appearance.  Cardiovascular:     Rate and Rhythm: Normal rate and regular rhythm.     Pulses: Normal pulses.     Heart sounds: Normal heart sounds.  Pulmonary:     Effort: Pulmonary effort is normal.     Breath sounds: Normal breath sounds.  Musculoskeletal:        General: Normal range of motion.  Skin:    General: Skin is warm and dry.  Neurological:     General: No focal deficit present.     Mental Status: He is alert and oriented to  person, place, and time.     Motor: Weakness present.     Gait: Gait abnormal (walks with cane).  Psychiatric:        Mood and Affect: Mood normal.        Behavior: Behavior normal.        Thought Content: Thought content normal.         Judgment: Judgment normal.       Assessment & Plan:  1. Shortness of breath (Primary) - Resolved. Lungs clear throughout.   2. Subacute cough - possibly allergy mediated. He can try using OTC allergy medication   Darleene Shape, NP  I personally spent a total of 38 minutes in the care of the patient today including preparing to see the patient, getting/reviewing separately obtained history, performing a medically appropriate exam/evaluation, counseling and educating, placing orders, and documenting clinical information in the EHR.

## 2024-06-06 DIAGNOSIS — H5340 Unspecified visual field defects: Secondary | ICD-10-CM | POA: Diagnosis not present

## 2024-06-08 ENCOUNTER — Other Ambulatory Visit (HOSPITAL_COMMUNITY): Payer: Self-pay

## 2024-06-10 ENCOUNTER — Other Ambulatory Visit: Payer: Self-pay | Admitting: Adult Health

## 2024-06-10 NOTE — Telephone Encounter (Unsigned)
 Copied from CRM #[id number]. Topic: Clinical - Medication Refill >> Jun 10, 2024  2:00 PM [place] J wrote: Medication: albuterol  (VENTOLIN  HFA) 108 (90 Base) MCG/ACT inhaler  Has the patient contacted their pharmacy? Yes (Agent: If no, request that the patient contact the pharmacy for the refill. If patient does not wish to contact the pharmacy document the reason why and proceed with request.) (Agent: If yes, when and what did the pharmacy advise?) Patient needed to reach out to primary for refill. This is the patient's preferred pharmacy:  [id number] [place] [place] - [address] AT [address] OF [facility] [address] [place] [address] Phone: [phone] Fax: [phone]  Is this the correct pharmacy for this prescription? Yes If no, delete pharmacy and type the correct one.   Has the prescription been filled recently? No  Is the patient out of the medication? Yes  Has the patient been seen for an appointment in the last year OR does the patient have an upcoming appointment? Yes  Can we respond through MyChart? No  Agent: Please be advised that Rx refills may take up to 3 business days. We ask that you follow-up with your pharmacy.

## 2024-06-11 DIAGNOSIS — E113293 Type 2 diabetes mellitus with mild nonproliferative diabetic retinopathy without macular edema, bilateral: Secondary | ICD-10-CM | POA: Diagnosis not present

## 2024-06-11 DIAGNOSIS — H353132 Nonexudative age-related macular degeneration, bilateral, intermediate dry stage: Secondary | ICD-10-CM | POA: Diagnosis not present

## 2024-06-11 DIAGNOSIS — H35033 Hypertensive retinopathy, bilateral: Secondary | ICD-10-CM | POA: Diagnosis not present

## 2024-06-11 DIAGNOSIS — H43823 Vitreomacular adhesion, bilateral: Secondary | ICD-10-CM | POA: Diagnosis not present

## 2024-06-11 DIAGNOSIS — H31092 Other chorioretinal scars, left eye: Secondary | ICD-10-CM | POA: Diagnosis not present

## 2024-06-11 DIAGNOSIS — Z961 Presence of intraocular lens: Secondary | ICD-10-CM | POA: Diagnosis not present

## 2024-06-11 DIAGNOSIS — H35713 Central serous chorioretinopathy, bilateral: Secondary | ICD-10-CM | POA: Diagnosis not present

## 2024-06-11 MED ORDER — ALBUTEROL SULFATE HFA 108 (90 BASE) MCG/ACT IN AERS: 2.0000 | INHALATION_SPRAY | Freq: Four times a day (QID) | RESPIRATORY_TRACT | 2 refills | Status: AC | PRN

## 2024-06-12 DIAGNOSIS — N2581 Secondary hyperparathyroidism of renal origin: Secondary | ICD-10-CM | POA: Diagnosis not present

## 2024-06-12 DIAGNOSIS — N186 End stage renal disease: Secondary | ICD-10-CM | POA: Diagnosis not present

## 2024-06-12 DIAGNOSIS — Z992 Dependence on renal dialysis: Secondary | ICD-10-CM | POA: Diagnosis not present

## 2024-06-12 NOTE — Progress Notes (Signed)
 [name] [address] [place] [address] Phone: [phone] Subjective:   Austin [name], am serving as a scribe for Dr. Arthea Claudene.  I'm seeing this patient by the request  of:  Merna Huxley, NP  CC: Low back and wrist pain   YEP:Dlagzrupcz  04/16/2024 Likely facet arthropathy with patient not complaining of any true radicular symptoms. Discussed icing regimen and home exercises, discussed which activities to do and which ones to avoid. Increase activity slowly. Discussed would like x-rays with patient having a history of prostate cancer as well as end-stage renal disease and MGUS. Depending on findings this may change medical management. May need advanced imaging as well. Follow-up again 2 to 3 months   Stable at the moment, no other changes in management, worsening pain can consider to repeat injection.  Continue bracing at home follow-up again in 6 to 8 weeks otherwise.      Update 06/18/2024 Austin [name] is a 89 y.o. male coming in with complaint of lumbar spine and R wrist pain. Patient states he has to stretch in the mornings to alleviate some of his pain. Moving around helps his pain. Has started PT.   Wrist has not been as painful. Feels stiff but less pain.      Has been to physical therapy and Occupational Therapy since we have seen him.  Patient also has had imaging of the chest without contrast recently.  Found to have an interval growth of a solid nodule in the right upper lobe  Past Medical History:  Diagnosis Date   Acute diastolic CHF (congestive heart failure) (HCC) 09/17/2020   Acute exacerbation of CHF (congestive heart failure) (HCC) 08/04/2019   Acute right PCA stroke (HCC) 04/25/2024   ANEMIA DUE TO CHRONIC BLOOD LOSS 03/13/2007   CAROTID ARTERY STENOSIS 05/10/2010   Coronary artery disease    DIABETES MELLITUS, TYPE II 09/19/2007   DISEASE, CEREBROVASCULAR NEC 03/05/2007   ESRD (end stage renal disease) on  dialysis (HCC)    GERD 03/13/2007   HYPERLIPIDEMIA 03/05/2007   HYPERTENSION 03/05/2007   HYPOKALEMIA 11/09/2009   KNEE PAIN, RIGHT 11/09/2009   New onset a-fib (HCC) 04/27/2024   Eliquis    PROSTATE CANCER, HX OF 03/05/2007   Past Surgical History:  Procedure Laterality Date   A/V FISTULAGRAM N/A 03/21/2024   Procedure: A/V Fistulagram;  Surgeon: Magda Debby SAILOR, MD;  Location: [facility];  Service: Cardiovascular;  Laterality: N/A;   AV FISTULA PLACEMENT Left 10/18/2022   Procedure: INSERTION OF LEFT ARM BRACHIAL ARTERY TO AXILLARY VEIN ARTERIOVENOUS (AV) GORE-TEX GRAFT;  Surgeon: Lanis Fonda BRAVO, MD;  [facility] OR;  Service: Vascular;  Laterality: Left;   CAROTID ARTERY ANGIOPLASTY Right Oct. 10, 2001   ESOPHAGOGASTRODUODENOSCOPY (EGD) WITH PROPOFOL  N/A 11/04/2016   Procedure: ESOPHAGOGASTRODUODENOSCOPY (EGD) WITH PROPOFOL ;  Surgeon: Layla Lah, MD;  Location: [facility];  Service: Gastroenterology;  Laterality: N/A;   IR FLUORO GUIDE CV LINE RIGHT  10/25/2022   IR FLUORO GUIDE CV LINE RIGHT  10/27/2022   IR US  GUIDE VASC ACCESS RIGHT  10/25/2022   IR US  GUIDE VASC ACCESS RIGHT  10/27/2022   LAPAROSCOPIC APPENDECTOMY  02/20/2012   Procedure: APPENDECTOMY LAPAROSCOPIC;  Surgeon: Jina Nephew, MD;  Location: [facility];  Service: General;  Laterality: N/A;   PROSTATE SURGERY     prostatectomy   Social History   Socioeconomic History   Marital status: Married    Spouse name: Not on file   Number  of children: Not on file   Years of education: Not on file   Highest education level: Not on file  Occupational History   Not on file  Tobacco Use   Smoking status: Former    Current packs/day: 0.00    Average packs/day: 1 pack/day for 10.0 years (10.0 ttl pk-yrs)    Types: Cigarettes    Start date: 08/22/1964    Quit date: 08/22/1974    Years since quitting: 49.8   Smokeless tobacco: Never  Vaping Use   Vaping status: Never Used  Substance and Sexual Activity   Alcohol  use: No     Alcohol /week: 0.0 standard drinks of alcohol    Drug use: No   Sexual activity: Not Currently  Other Topics Concern   Not on file  Social History Narrative   Retired - Administrator, Arts    Married 53 years       He enjoys traveling    Social Drivers of [employer]: Low Risk  (02/09/2023)   Overall Financial Resource Strain (CARDIA)    Difficulty of Paying Living Expenses: Not very hard  Food Insecurity: No Food Insecurity (04/26/2024)   Hunger Vital Sign    Worried About Running Out of Food in the Last Year: Never true    Ran Out of Food in the Last Year: Never true  Transportation Needs: No Transportation Needs (04/26/2024)   PRAPARE - Administrator, Civil Service (Medical): No    Lack of Transportation (Non-Medical): No  Physical Activity: Insufficiently Active (02/09/2023)   Exercise Vital Sign    Days of Exercise per Week: 3 days    Minutes of Exercise per Session: 30 min  Stress: No Stress Concern Present (02/09/2023)   [employer] of Occupational Health - Occupational Stress Questionnaire    Feeling of Stress : Not at all  Social Connections: Unknown (04/26/2024)   Social Connection and Isolation Panel    Frequency of Communication with Friends and Family: Patient declined    Frequency of Social Gatherings with Friends and Family: Patient declined    Attends Religious Services: Patient declined    Database Administrator or Organizations: Not on file    Attends Banker Meetings: Patient declined    Marital Status: Married   Allergies  Allergen Reactions   Aspirin  Other (See Comments)    High doses causes stomach ulcer and bleeding   Family History  Problem Relation Age of Onset   Hypertension Mother    Cancer Father        Mesothelioma    Stomach cancer Brother    Cancer Brother    Cancer - Cervical Brother    Cancer Brother    Diabetes Brother    Esophageal cancer Neg Hx    Colon cancer Neg Hx    Pancreatic  cancer Neg Hx      Current Outpatient Medications (Cardiovascular):    amLODipine  (NORVASC ) 10 MG tablet, TAKE 1 TABLET BY MOUTH EVERY DAY   atorvastatin  (LIPITOR) 20 MG tablet, TAKE 1 TABLET BY MOUTH EVERY DAY   cloNIDine  (CATAPRES ) 0.2 MG tablet, Take 1 tablet (0.2 mg total) by mouth daily.   doxazosin  (CARDURA ) 2 MG tablet, Take 2 mg by mouth at bedtime.    isosorbide -hydrALAZINE  (BIDIL ) 20-37.5 MG tablet, Take 1 tablet by mouth 3 (three) times daily.  Current Outpatient Medications (Respiratory):    albuterol  (VENTOLIN  HFA) 108 (90 Base) MCG/ACT inhaler, Inhale 2 puffs into the lungs every  6 (six) hours as needed for wheezing or shortness of breath.   cetirizine  (ZYRTEC ) 5 MG tablet, Take 1 tablet (5 mg total) by mouth at bedtime.   fluticasone  (FLONASE ) 50 MCG/ACT nasal spray, Place 1 spray into both nostrils daily.   guaiFENesin  (MUCINEX ) 600 MG 12 hr tablet, Take 600 mg by mouth at bedtime.  Current Outpatient Medications (Analgesics):    acetaminophen  (TYLENOL ) 500 MG tablet, Take 1,000 mg by mouth every 4 (four) hours as needed.  Current Outpatient Medications (Hematological):    apixaban  (ELIQUIS ) 2.5 MG TABS tablet, Take 1 tablet (2.5 mg total) by mouth 2 (two) times daily.   Methoxy PEG-Epoetin  Beta (MIRCERA IJ), Mircera  Current Outpatient Medications (Other):    Accu-Chek Softclix Lancets lancets, USED TO CHECK BLOOD GLUCOSE TWICE A DAY OR AS NEEDED   cholecalciferol  (VITAMIN D3) 25 MCG (1000 UNIT) tablet, Take 1,000 Units by mouth daily.   esomeprazole  (NEXIUM ) 40 MG capsule, TAKE 1 CAPSULE BY MOUTH EVERY DAY   glucose blood (ACCU-CHEK GUIDE) test strip, USE TO CHECK BLOOD GLUCOSE TWICE A DAY OR AS NEEDED   ketoconazole  (NIZORAL ) 2 % cream, Apply 1 Application topically 2 (two) times daily.   lactulose  (CHRONULAC ) 10 GM/15ML solution, TAKE 30 ML BY MOUTH TWICE DAILY AS NEEDED   Melatonin 10 MG TABS, Take 10 mg by mouth at bedtime.   methylcellulose (CITRUCEL) oral  powder, 1 tablespoon every 2-3 days,alternating with lactulose    Multiple Vitamins-Minerals (PRESERVISION AREDS 2) CAPS, Take 1 capsule by mouth daily.   Propylene Glycol (SYSTANE BALANCE OP), Place 1 drop into both eyes at bedtime.   sevelamer  carbonate (RENVELA ) 800 MG tablet, Take 800 mg by mouth 3 (three) times daily.   Reviewed prior external information including notes and imaging from  primary care provider As well as notes that were available from care everywhere and other healthcare systems.  Past medical history, social, surgical and family history all reviewed in electronic medical record.  No pertanent information unless stated regarding to the chief complaint.   Review of Systems:  No headache, visual changes, nausea, vomiting, diarrhea, constipation, dizziness, abdominal pain, skin rash, fevers, chills, night sweats, weight loss, swollen lymph nodes, body aches, joint swelling, chest pain, shortness of breath, mood changes. POSITIVE muscle aches  Objective  Blood pressure (!) 138/58, pulse 75, height 5' 7 (1.702 m), weight 175 lb (79.4 kg), SpO2 97%.   General: No apparent distress alert and oriented x3 mood and affect normal, dressed appropriately.  HEENT: Pupils equal, extraocular movements intact  Patient does have an antalgic gait noted, does use a cane in his right hand.  Right hand has some mild thenar eminence wasting noted. Patient has a negative Tinel's sign noted.  Does have some chronic numbness noted. Back exam today.     Impression and Recommendations:     The above documentation has been reviewed and is accurate and complete Lashara Urey M Luay Balding, DO

## 2024-06-13 ENCOUNTER — Ambulatory Visit: Admitting: Occupational Therapy

## 2024-06-13 ENCOUNTER — Encounter: Payer: Self-pay | Admitting: Physical Therapy

## 2024-06-13 ENCOUNTER — Other Ambulatory Visit: Payer: Self-pay

## 2024-06-13 ENCOUNTER — Ambulatory Visit: Admitting: Physical Therapy

## 2024-06-13 DIAGNOSIS — M6281 Muscle weakness (generalized): Secondary | ICD-10-CM

## 2024-06-13 DIAGNOSIS — R41842 Visuospatial deficit: Secondary | ICD-10-CM

## 2024-06-13 DIAGNOSIS — R2681 Unsteadiness on feet: Secondary | ICD-10-CM

## 2024-06-13 DIAGNOSIS — I639 Cerebral infarction, unspecified: Secondary | ICD-10-CM | POA: Diagnosis not present

## 2024-06-13 DIAGNOSIS — R262 Difficulty in walking, not elsewhere classified: Secondary | ICD-10-CM | POA: Diagnosis not present

## 2024-06-13 DIAGNOSIS — R29818 Other symptoms and signs involving the nervous system: Secondary | ICD-10-CM | POA: Diagnosis not present

## 2024-06-13 NOTE — Therapy (Signed)
 OUTPATIENT PHYSICAL THERAPY NEURO EVALUATION   Patient Name: Austin [name] MRN: [id number] DOB:December 01, 1934, 89 y.o., male Today's Date: 06/13/2024   PCP: Merna Huxley, NP  REFERRING PROVIDER: Merna Huxley, NP   END OF SESSION:  PT End of Session - 06/13/24 1147     Visit Number 2    Number of Visits 13    Date for Recertification  07/18/24    Authorization Type [employer]    Authorization Time Period 05/23/2024-07/18/2024    Authorization - Visit Number 2    Authorization - Number of Visits 13    PT Start Time 1149   coming from OT   PT Stop Time 1230    PT Time Calculation (min) 41 min    Equipment Utilized During Treatment Gait belt    Activity Tolerance Patient tolerated treatment well    Behavior During Therapy [facility] for tasks assessed/performed          Past Medical History:  Diagnosis Date   Acute diastolic CHF (congestive heart failure) (HCC) 09/17/2020   Acute exacerbation of CHF (congestive heart failure) (HCC) 08/04/2019   Acute right PCA stroke (HCC) 04/25/2024   ANEMIA DUE TO CHRONIC BLOOD LOSS 03/13/2007   CAROTID ARTERY STENOSIS 05/10/2010   Coronary artery disease    DIABETES MELLITUS, TYPE II 09/19/2007   DISEASE, CEREBROVASCULAR NEC 03/05/2007   ESRD (end stage renal disease) on dialysis (HCC)    GERD 03/13/2007   HYPERLIPIDEMIA 03/05/2007   HYPERTENSION 03/05/2007   HYPOKALEMIA 11/09/2009   KNEE PAIN, RIGHT 11/09/2009   New onset a-fib (HCC) 04/27/2024   Eliquis    PROSTATE CANCER, HX OF 03/05/2007   Past Surgical History:  Procedure Laterality Date   A/V FISTULAGRAM N/A 03/21/2024   Procedure: A/V Fistulagram;  Surgeon: Magda Debby SAILOR, MD;  Location: [facility];  Service: Cardiovascular;  Laterality: N/A;   AV FISTULA PLACEMENT Left 10/18/2022   Procedure: INSERTION OF LEFT ARM BRACHIAL ARTERY TO AXILLARY VEIN ARTERIOVENOUS (AV) GORE-TEX GRAFT;  Surgeon: Lanis Fonda BRAVO, MD;  Location: [facility] OR;  Service: Vascular;  Laterality: Left;    CAROTID ARTERY ANGIOPLASTY Right Oct. 10, 2001   ESOPHAGOGASTRODUODENOSCOPY (EGD) WITH PROPOFOL  N/A 11/04/2016   Procedure: ESOPHAGOGASTRODUODENOSCOPY (EGD) WITH PROPOFOL ;  Surgeon: Layla Lah, MD;  Location: [facility];  Service: Gastroenterology;  Laterality: N/A;   IR FLUORO GUIDE CV LINE RIGHT  10/25/2022   IR FLUORO GUIDE CV LINE RIGHT  10/27/2022   IR US  GUIDE VASC ACCESS RIGHT  10/25/2022   IR US  GUIDE VASC ACCESS RIGHT  10/27/2022   LAPAROSCOPIC APPENDECTOMY  02/20/2012   Procedure: APPENDECTOMY LAPAROSCOPIC;  Surgeon: Jina Nephew, MD;  Location: [facility] OR;  Service: General;  Laterality: N/A;   PROSTATE SURGERY     prostatectomy   Patient Active Problem List   Diagnosis Date Noted   New onset atrial fibrillation (HCC) 04/27/2024   Acute CVA (cerebrovascular accident) (HCC) 04/25/2024   Lumbar facet arthropathy 04/16/2024   Secondary hyperparathyroidism of renal origin 06/02/2023   Preoperative evaluation of a medical condition to rule out surgical contraindications (TAR required) 05/04/2023   Right carpal tunnel syndrome 03/16/2023   Coagulation defect, unspecified 11/07/2022   Personal history of nicotine dependence 11/01/2022   ESRD on dialysis (HCC) 10/29/2022   Protein-calorie malnutrition, severe 10/28/2022   Anemia in chronic kidney disease 10/28/2022   Hyperlipidemia, unspecified 10/28/2022   Pneumonia due to COVID-19 virus 10/25/2022   CKD (chronic kidney disease) stage 5, GFR less than 15 ml/min (HCC) 10/25/2022  Severe pulmonary hypertension (HCC) 10/25/2022   Moderate aortic regurgitation 10/25/2022   CAD in native artery 10/25/2022   UTI (urinary tract infection) 09/27/2021   CHF exacerbation (HCC) 09/26/2021   Acute respiratory failure with hypoxia (HCC) 09/26/2021   Acute on chronic diastolic CHF (congestive heart failure) (HCC) 07/09/2021   Dyspnea 06/01/2021   Upper airway cough syndrome 04/06/2021   Porokeratosis 03/26/2021   Pulmonary nodules 10/22/2020    Acute on chronic renal failure 10/09/2020   Mass of right lung 09/18/2020   Acute kidney injury superimposed on CKD 08/04/2019   Elevated troponin 08/04/2019   Hypokalemia 08/04/2019   Hypertensive urgency 03/28/2019   Elevated serum immunoglobulin free light chains 02/20/2019   Gastrointestinal hemorrhage with melena    Melena 11/03/2016   Carotid artery stenosis 05/10/2010   Type 2 diabetes mellitus with hyperlipidemia (HCC) 09/19/2007   Iron  deficiency anemia due to chronic blood loss 03/13/2007   GERD 03/13/2007   Essential hypertension 03/05/2007   DISEASE, CEREBROVASCULAR NEC 03/05/2007   PROSTATE CANCER, HX OF 03/05/2007    ONSET DATE: 04/25/24  REFERRING DIAG: I63.9 (ICD-10-CM) - Acute CVA (cerebrovascular accident) (HCC) R26.81 (ICD-10-CM) - Gait instability  THERAPY DIAG:  Muscle weakness (generalized)  Unsteadiness on feet  Rationale for Evaluation and Treatment: Rehabilitation  SUBJECTIVE:                                                                                                                                                                                             SUBJECTIVE STATEMENT: Was in the hospital since PT eval due to shortness of breath.  They said emphysema and gave me a new inhaler.     Pt accompanied by: significant other and in waiting room  PERTINENT HISTORY: CHF, carotid artery stenosis, CAD, DMII, HLD, HTN, prostate CA, L AV fistula Dialysis: M, W, F  PAIN:  Are you having pain? No  PRECAUTIONS: Fall and Other: L AV fistula- no L UE BP   RED FLAGS: None   WEIGHT BEARING RESTRICTIONS: No  FALLS: Has patient fallen in last 6 months? No  LIVING ENVIRONMENT: Lives with: lives with their spouse Lives in: House/apartment Stairs: 3-4 steps to enter with railing; 2 story home Has following equipment at home: Vannie - 2 wheeled, Environmental consultant - 4 wheeled, Wheelchair (manual), and quad tip cane  PLOF: Independent with basic ADLs and wife  was assisting with shower transfers, cooking, cleaning   PATIENT GOALS: maybe a little balance  OBJECTIVE:    TODAY'S TREATMENT: 06/13/2024 Activity Comments  Pre walk vitals:  O2 sats 97% HR 73 bpm 135/65   6 MWT distance:  365 ft   Post walk vitals:  O2 sats 93%, HR 85 143/66 Mild SOB  Counter balance exercises: Heel toe raises 2 x 10 Marching in place 2 x 10 Sidestepping, 3 reps Forward/back stepping, 3 reps Cues for foot clearance         PATIENT EDUCATION: Education details: HEP initiated Person educated: Patient Education method: Programmer, multimedia, Facilities manager, and Handouts Education comprehension: verbalized understanding, returned demonstration, and needs further education   HOME EXERCISE PROGRAM: Access Code: [email or web address] URL: [email or web address] Date: 06/13/2024 Prepared by: [facility] - Outpatient  Rehab - [facility]  Exercises - Heel Toe Raises with Counter Support  - 1 x daily - 4 x weekly - 2 sets - 10 reps - March in Place  - 1 x daily - 4 x weekly - 2 sets - 10 reps - Side Stepping with Counter Support  - 1 x daily - 4 x weekly - 1 sets - 3 reps  -------------------------------------------------------- Note: Objective measures were completed at Evaluation unless otherwise noted.  DIAGNOSTIC FINDINGS: 04/25/24 brain MRI: Acute infarct in the right PCA territory as above. 2. 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 3. Additional remote lacunar infarcts in the left thalamus. 4. Focus of signal abnormality and susceptibility within the left parietal lobe, possibly representing remote hemorrhage or vascular malformation such as cavernous malformation. Additional similar focus of signal abnormality and susceptibility along the right dorsal aspect of the pons.  COGNITION: Overall cognitive status: difficult to discern; requires increased instruction for tasks occasionally    SENSATION: Pt reports N/T in  UEs  COORDINATION: Alternating pronation/supination: slight dysmetria B Alternating toe tap: hypokinesia L>R Finger to nose: WNL B  MUSCLE TONE: WNL B LE  POSTURE: rounded shoulders, forward head, and shoulders elevations  LOWER EXTREMITY ROM:     Active  Right Eval Left Eval  Hip flexion    Hip extension    Hip abduction    Hip adduction    Hip internal rotation    Hip external rotation    Knee flexion    Knee extension    Ankle dorsiflexion 8 16  Ankle plantarflexion    Ankle inversion    Ankle eversion     (Blank rows = not tested)  LOWER EXTREMITY MMT:    MMT (in sitting) Right Eval Left Eval  Hip flexion 4+ 4+  Hip extension    Hip abduction 4- 4-  Hip adduction 4- 4-  Hip internal rotation    Hip external rotation    Knee flexion 3+ 3+  Knee extension 4+ 4  Ankle dorsiflexion 4 4  Ankle plantarflexion 4 4  Ankle inversion    Ankle eversion    (Blank rows = not tested)  GAIT: Findings: Assistive device utilized:quad tip cane, Level of assistance: SBA and CGA, and Comments: limited foot clearance B and tendency to shuffle feet; slowed *pt requires increased direction to navigate clinic environment; suspect vision vs. Cognition as a factor   FUNCTIONAL TESTS:  5 times sit to stand: 23.8 sec pushing off LEs without standing fully  10 meter walk test: 17.23 sec with cane (1.9 ft/sec)  TREATMENT DATE: 05/23/24    PATIENT EDUCATION: Education details: edu on benefits of OT for vision, prognosis, POC, exam findings as they relate for functional impairments  Person educated: Patient and Spouse Education method: Explanation Education comprehension: verbalized understanding  HOME EXERCISE PROGRAM: Not yet initiated    GOALS: Goals reviewed with patient? Yes  SHORT TERM GOALS: Target date: 06/13/2024  Patient to be  independent with initial HEP. Baseline: HEP initiated Goal status: INITIAL    LONG TERM GOALS: Target date: 07/18/2024  Patient to be independent with advanced HEP. Baseline: Not yet initiated  Goal status: INITIAL  to be improve to at least 400 ft for improved gait efficiency and endurance. Baseline: 365 ft Goal status: INITIAL  Patient to score at least 15/24 on DGI in order to decrease risk of falls.  Baseline: 7 Goal status: INITIAL  Patient to demonstrate 5xSTS test in <15 sec in order to decrease risk of falls.  Baseline: 23 sec Goal status: INITIAL  Patient to demonstrate gait speed of at least 2.3 ft/sec in order to improve access to community.  Baseline: 1.9 ft/sec Goal status: INITIAL   ASSESSMENT:  CLINICAL IMPRESSION: Pt presents today with no new complaints; he was in ED due to shortness of breath since PT eval and has followed up with MD.  [facility]itals are [facility] today. Skilled PT session focused on assessing 6 MWT with pt ambulating 365 ft during this time. He has slowed pace and decreased foot clearance, so initiated HEP to address balance and foot clearance.  Pt will continue to benefit from skilled PT towards goals for improved functional mobility and decreased fall risk.    OBJECTIVE IMPAIRMENTS: Abnormal gait, decreased activity tolerance, decreased balance, decreased coordination, decreased endurance, difficulty walking, decreased ROM, decreased strength, decreased safety awareness, impaired flexibility, and postural dysfunction.   ACTIVITY LIMITATIONS: carrying, lifting, bending, sitting, standing, squatting, stairs, transfers, bed mobility, bathing, toileting, dressing, reach over head, hygiene/grooming, and locomotion level  PARTICIPATION LIMITATIONS: meal prep, cleaning, shopping, community activity, and church  PERSONAL FACTORS: Age, Fitness, Past/current experiences, Time since onset of injury/illness/exacerbation, and 3+ comorbidities: CHF, carotid  artery stenosis, CAD, DMII, HLD, HTN, prostate CA, L AV fistula are also affecting patient's functional outcome.   REHAB POTENTIAL: Good  CLINICAL DECISION MAKING: Evolving/moderate complexity  EVALUATION COMPLEXITY: Moderate  PLAN:  PT FREQUENCY: 1-2x/week  PT DURATION: 6 weeks  PLANNED INTERVENTIONS: [id number]- PT Re-evaluation, 97750- Physical Performance Testing, 97110-Therapeutic exercises, 97530- Therapeutic activity, 97112- Neuromuscular re-education, 97535- Self Care, [id number]- Manual therapy, [phone]- Gait training, [phone]- Canalith repositioning, Patient/Family education, Balance training, Stair training, Taping, Vestibular training, DME instructions, Cryotherapy, and Moist heat  PLAN FOR NEXT SESSION: Review HEP for BLE strength and balance and update HEP as appropriate    Greig Anon, PT 06/13/24 12:35 PM Phone: [phone] Fax: [phone]  [facility] at [facility] [address], [address] [place], [place] [address] Phone # [phone] Fax # [phone]

## 2024-06-13 NOTE — Therapy (Signed)
 OUTPATIENT OCCUPATIONAL THERAPY NEURO EVALUATION  Patient Name: Austin [name] MRN: [id number] DOB:1935/08/13, 89 y.o., male Today's Date: 06/13/2024  PCP: Merna Huxley, NP REFERRING PROVIDER: Merna Huxley, NP  END OF SESSION:  OT End of Session - 06/13/24 1146     Visit Number 1    Number of Visits 10    Date for Recertification  07/26/24    Authorization Type [employer] 2025    OT Start Time 1101    OT Stop Time 1145    OT Time Calculation (min) 44 min          Past Medical History:  Diagnosis Date   Acute diastolic CHF (congestive heart failure) (HCC) 09/17/2020   Acute exacerbation of CHF (congestive heart failure) (HCC) 08/04/2019   Acute right PCA stroke (HCC) 04/25/2024   ANEMIA DUE TO CHRONIC BLOOD LOSS 03/13/2007   CAROTID ARTERY STENOSIS 05/10/2010   Coronary artery disease    DIABETES MELLITUS, TYPE II 09/19/2007   DISEASE, CEREBROVASCULAR NEC 03/05/2007   ESRD (end stage renal disease) on dialysis (HCC)    GERD 03/13/2007   HYPERLIPIDEMIA 03/05/2007   HYPERTENSION 03/05/2007   HYPOKALEMIA 11/09/2009   KNEE PAIN, RIGHT 11/09/2009   New onset a-fib (HCC) 04/27/2024   Eliquis    PROSTATE CANCER, HX OF 03/05/2007   Past Surgical History:  Procedure Laterality Date   A/V FISTULAGRAM N/A 03/21/2024   Procedure: A/V Fistulagram;  Surgeon: Magda Debby SAILOR, MD;  Location: [facility];  Service: Cardiovascular;  Laterality: N/A;   AV FISTULA PLACEMENT Left 10/18/2022   Procedure: INSERTION OF LEFT ARM BRACHIAL ARTERY TO AXILLARY VEIN ARTERIOVENOUS (AV) GORE-TEX GRAFT;  Surgeon: Lanis Fonda BRAVO, MD;  Location: [facility] OR;  Service: Vascular;  Laterality: Left;   CAROTID ARTERY ANGIOPLASTY Right Oct. 10, 2001   ESOPHAGOGASTRODUODENOSCOPY (EGD) WITH PROPOFOL  N/A 11/04/2016   Procedure: ESOPHAGOGASTRODUODENOSCOPY (EGD) WITH PROPOFOL ;  Surgeon: Layla Lah, MD;  Location: [facility];  Service: Gastroenterology;  Laterality: N/A;   IR FLUORO GUIDE CV LINE RIGHT   10/25/2022   IR FLUORO GUIDE CV LINE RIGHT  10/27/2022   IR US  GUIDE VASC ACCESS RIGHT  10/25/2022   IR US  GUIDE VASC ACCESS RIGHT  10/27/2022   LAPAROSCOPIC APPENDECTOMY  02/20/2012   Procedure: APPENDECTOMY LAPAROSCOPIC;  Surgeon: Jina Nephew, MD;  Location: [facility] OR;  Service: General;  Laterality: N/A;   PROSTATE SURGERY     prostatectomy   Patient Active Problem List   Diagnosis Date Noted   New onset atrial fibrillation (HCC) 04/27/2024   Acute CVA (cerebrovascular accident) (HCC) 04/25/2024   Lumbar facet arthropathy 04/16/2024   Secondary hyperparathyroidism of renal origin 06/02/2023   Preoperative evaluation of a medical condition to rule out surgical contraindications (TAR required) 05/04/2023   Right carpal tunnel syndrome 03/16/2023   Coagulation defect, unspecified 11/07/2022   Personal history of nicotine dependence 11/01/2022   ESRD on dialysis (HCC) 10/29/2022   Protein-calorie malnutrition, severe 10/28/2022   Anemia in chronic kidney disease 10/28/2022   Hyperlipidemia, unspecified 10/28/2022   Pneumonia due to COVID-19 virus 10/25/2022   CKD (chronic kidney disease) stage 5, GFR less than 15 ml/min (HCC) 10/25/2022   Severe pulmonary hypertension (HCC) 10/25/2022   Moderate aortic regurgitation 10/25/2022   CAD in native artery 10/25/2022   UTI (urinary tract infection) 09/27/2021   CHF exacerbation (HCC) 09/26/2021   Acute respiratory failure with hypoxia (HCC) 09/26/2021   Acute on chronic diastolic CHF (congestive heart failure) (HCC) 07/09/2021   Dyspnea 06/01/2021  Upper airway cough syndrome 04/06/2021   Porokeratosis 03/26/2021   Pulmonary nodules 10/22/2020   Acute on chronic renal failure 10/09/2020   Mass of right lung 09/18/2020   Acute kidney injury superimposed on CKD 08/04/2019   Elevated troponin 08/04/2019   Hypokalemia 08/04/2019   Hypertensive urgency 03/28/2019   Elevated serum immunoglobulin free light chains 02/20/2019   Gastrointestinal  hemorrhage with melena    Melena 11/03/2016   Carotid artery stenosis 05/10/2010   Type 2 diabetes mellitus with hyperlipidemia (HCC) 09/19/2007   Iron  deficiency anemia due to chronic blood loss 03/13/2007   GERD 03/13/2007   Essential hypertension 03/05/2007   DISEASE, CEREBROVASCULAR NEC 03/05/2007   PROSTATE CANCER, HX OF 03/05/2007    ONSET DATE: referral date 05/24/24  REFERRING DIAG: H53.9 (ICD-10-CM) - Visual disturbance  THERAPY DIAG:  Visuospatial deficit  Muscle weakness (generalized)  Rationale for Evaluation and Treatment: Rehabilitation  SUBJECTIVE:   SUBJECTIVE STATEMENT: Pt reports going in to the hospital early Sept due to visual changes.  Pt reports that he was walking in the grocery store and noticed that things were getting dimmer.  Pt reports that he saw a retinal specialist and he immediately sent him to the hospital.  Pt's spouse reports that he doesn't see obstacles on Left side.  Pt's spouse reports that pt requires increased time with most tasks and fatigues quickly. Pt accompanied by: self and significant other  PERTINENT HISTORY: Admitted to [facility] on 9/4 with visual changes over the last 4 days. MRI reveals acute R PCA infarct and remote R basal ganglia infarct with evidence of prior hemorrhage.   H/o: CHF, carotid artery stenosis, CAD, DMII, HLD, HTN, prostate CA, L AV fistula Dialysis: M, W, F  PRECAUTIONS: Fall and Other: L AV fistula- no L UE BP   WEIGHT BEARING RESTRICTIONS: No  PAIN:  Are you having pain? No  FALLS: Has patient fallen in last 6 months? No  LIVING ENVIRONMENT: Lives with: lives with their spouse Lives in: House/apartment Stairs: 3-4 steps to enter with railing; 2 story home with bedroom/full bath on the second floor Has following equipment at home: Vannie - 2 wheeled, Environmental consultant - 4 wheeled, Wheelchair (manual), and quad tip cane  PLOF: Independent with basic ADLs and wife was assisting with shower transfers, cooking, cleaning    PATIENT GOALS: strategies to aid in vision; more energy to do things  OBJECTIVE:  Note: Objective measures were completed at Evaluation unless otherwise noted.  HAND DOMINANCE: Right  ADLs: Transfers/ambulation related to ADLs: using quad tip cane and cues for L attention Eating: Mod I Grooming: some difficulty with shaving, but I manage UB Dressing: Mod I LB Dressing: Mod I Toileting: Mod I Bathing: occasional assist with washing back Tub Shower transfers: very difficult to get out of garden tub  IADLs: Shopping: wife completes Light housekeeping: wife completes Meal Prep: would make breakfast prior to stroke, but has not since Community mobility: not driving Medication management: spouse administers meds, has used a pill box in the past Financial management: wife completes  MOBILITY STATUS: utilizing quad tip cane   POSTURE COMMENTS:  rounded shoulders, forward head, and shoulders elevations   ACTIVITY TOLERANCE: Activity tolerance: reports decreased activity tolerance  UPPER EXTREMITY ROM:  WFL bilaterally   UPPER EXTREMITY MMT:   4+5 overall   COORDINATION: 9 Hole Peg test: Right: 47.44 sec; Left: 58.57 sec - pt picking up additional pegs to place due to decreased visual attention and looking for additional holes  SENSATION: WFL  COGNITION: Overall cognitive status: spouse reports that he gets confused more since the stroke than before  VISION: Subjective report: decreased attention to L Baseline vision: Bifocals and Wears glasses all the time Visual history: cataracts, had those removed  VISION ASSESSMENT: Gaze preference/alignment: WDL Tracking/Visual pursuits: Decreased smoothness with horizontal tracking Saccades: undershoots, decreased speed of saccadic movements, and decreased scanning to L visual field Visual Fields: Left visual field deficits Confrontation testing: pt unable to identify stimulus in Left visual field with or without  stimulus on Right Line bisection: pt correctly bisecting 4/6 lines, verbally stating where is the end of the line and turning head to scan full paper in 2 instances.  Patient has difficulty with following activities due to following visual impairments: reading from medical list as pt frequently omitting words on far left  PERCEPTION: Impaired: Inattention/neglect: does not attend to left visual field  OBSERVATIONS: Pt requiring verbal cues for visual attention and scanning to Left while navigating through treatment space as pt initially not able to see treatment room on Left side of gym.                                                                                                                             TREATMENT DATE:  06/13/24 Visual attention: OT educating on lighthouse strategy providing with handout and education on head turning and scanning to L during ambulation and with self-care tasks to ensure attention to stimulus on L side.  OT educating pt and spouse on not rearranging furniture at this time due to decreased attention and memory, not wanting to increase fall risk unintentionally.     PATIENT EDUCATION: Education details: Educated on role and purpose of OT as well as potential interventions and goals for therapy based on initial evaluation findings. Person educated: Patient and Spouse Education method: Explanation, Verbal cues, and Handouts Education comprehension: verbalized understanding and needs further education  HOME EXERCISE PROGRAM: Visual spatial awareness - see pt instructions   GOALS: Goals reviewed with patient? Yes  SHORT TERM GOALS: Target date: 07/05/24  Pt and spouse with verbalize and demonstrate understanding of visual compensatory strategies. Baseline: Goal status: INITIAL  2.  Pt will be independent in visual exercises to increase awareness and vision in L visual field.  Baseline:  Goal status: INITIAL  3.  Pt will complete table top  scanning activity with min cues for technique. Baseline:  Goal status: INITIAL  4.  Pt will verbalize understanding of safe tub/shower transfers. Baseline:  Goal status: INITIAL   LONG TERM GOALS: Target date: 07/26/24  Pt will navigate a moderately busy environment, following multi-step commands with 90% accuracy Baseline:  Goal status: INITIAL  2.  Pt will complete table top scanning activity with Supervision in standing for increased visual attention and standing tolerance. Baseline:  Goal status: INITIAL  3.  Pt will demonstrate improved fine motor coordination for ADLs and visual attention to Left visual field as  evidenced by decreasing 9 hole peg test score for BUE by 5 secs on RUE and 10 seconds on LUE.  Baseline: Right: 47.44 sec; Left: 58.57 sec Goal status: INITIAL  4.  Pt will verbalize and/or demonstrate understanding of energy conservation strategies to increase engagement in and safety with ADLs and IADLs.  Baseline:  Goal status: INITIAL    ASSESSMENT:  CLINICAL IMPRESSION: Patient is a 89 y.o. male who was seen today for occupational therapy evaluation for visual deficits s/p CVA. Pt demonstrating impairments of L visual field, inattention, and decreased processing speed.  Pt benefiting from verbal cues during assessment to scan to L environment and table top to ensure attention to L environment.  Pt currently lives with spouse in a 2 story home with 3-4 steps to enter. Pt will benefit from skilled occupational therapy services to address balance, GM/FM control, activity tolerance, safety awareness, introduction of compensatory strategies/AE prn, visual-perception, and implementation of an HEP to improve participation and safety during ADLs and IADLs.    PERFORMANCE DEFICITS: in functional skills including ADLs, IADLs, Fine motor control, balance, body mechanics, endurance, decreased knowledge of precautions, decreased knowledge of use of DME, and vision, cognitive  skills including memory and safety awareness, and psychosocial skills including coping strategies, environmental adaptation, and routines and behaviors.   IMPAIRMENTS: are limiting patient from ADLs and IADLs.   CO-MORBIDITIES: may have co-morbidities  that affects occupational performance. Patient will benefit from skilled OT to address above impairments and improve overall function.  MODIFICATION OR ASSISTANCE TO COMPLETE EVALUATION: No modification of tasks or assist necessary to complete an evaluation.  OT OCCUPATIONAL PROFILE AND HISTORY: Problem focused assessment: Including review of records relating to presenting problem.  CLINICAL DECISION MAKING: LOW - limited treatment options, no task modification necessary  REHAB POTENTIAL: Good  EVALUATION COMPLEXITY: Low    PLAN:  OT FREQUENCY: 2x/week for 3 weeks, then 1x/week for additional 3 weeks  OT DURATION: 6 weeks  PLANNED INTERVENTIONS: 97168 OT Re-evaluation, 97535 self care/ADL training, 02889 therapeutic exercise, 97530 therapeutic activity, 97112 neuromuscular re-education, functional mobility training, visual/perceptual remediation/compensation, psychosocial skills training, energy conservation, coping strategies training, patient/family education, and DME and/or AE instructions  RECOMMENDED OTHER SERVICES: NA  CONSULTED AND AGREED WITH PLAN OF CARE: Patient and family member/caregiver  PLAN FOR NEXT SESSION: complete Trail making and/or Bell Cancellation test, complete peg board pattern replication, educate on visual scanning during table top and with mobility.  Incorporate mobility with scavenger hunt type tasks  Referring diagnosis? H53.9 (ICD-10-CM) - Visual disturbance Treatment diagnosis? (if different than referring diagnosis)  Visuospatial deficit  Muscle weakness (generalized) What was this (referring dx) caused by? []  Surgery []  Fall []  Ongoing issue []  Arthritis [x]  Other:  ____CVA________  Laterality: []  Rt [x]  Lt []  Both  Check all possible CPT codes:  *CHOOSE 10 OR LESS*    See Planned Interventions listed in the Plan section of the Evaluation.    KAYLENE DOMINO, OTR/L 06/13/2024, 11:55 AM   [facility] at [facility] [address] [place], [address] [place], [place] [address] Phone # [phone] Fax # [phone]

## 2024-06-14 DIAGNOSIS — N2581 Secondary hyperparathyroidism of renal origin: Secondary | ICD-10-CM | POA: Diagnosis not present

## 2024-06-14 DIAGNOSIS — Z992 Dependence on renal dialysis: Secondary | ICD-10-CM | POA: Diagnosis not present

## 2024-06-17 DIAGNOSIS — N186 End stage renal disease: Secondary | ICD-10-CM | POA: Diagnosis not present

## 2024-06-18 ENCOUNTER — Ambulatory Visit: Admitting: Occupational Therapy

## 2024-06-18 ENCOUNTER — Ambulatory Visit

## 2024-06-18 ENCOUNTER — Ambulatory Visit: Admitting: Family Medicine

## 2024-06-18 VITALS — BP 138/58 | HR 75 | Ht 67.0 in | Wt 175.0 lb

## 2024-06-18 DIAGNOSIS — G5601 Carpal tunnel syndrome, right upper limb: Secondary | ICD-10-CM

## 2024-06-18 DIAGNOSIS — M47816 Spondylosis without myelopathy or radiculopathy, lumbar region: Secondary | ICD-10-CM | POA: Diagnosis not present

## 2024-06-18 DIAGNOSIS — R2681 Unsteadiness on feet: Secondary | ICD-10-CM | POA: Diagnosis not present

## 2024-06-18 DIAGNOSIS — R262 Difficulty in walking, not elsewhere classified: Secondary | ICD-10-CM

## 2024-06-18 DIAGNOSIS — M6281 Muscle weakness (generalized): Secondary | ICD-10-CM | POA: Diagnosis not present

## 2024-06-18 DIAGNOSIS — R41842 Visuospatial deficit: Secondary | ICD-10-CM

## 2024-06-18 DIAGNOSIS — R29818 Other symptoms and signs involving the nervous system: Secondary | ICD-10-CM | POA: Diagnosis not present

## 2024-06-18 DIAGNOSIS — I639 Cerebral infarction, unspecified: Secondary | ICD-10-CM | POA: Diagnosis not present

## 2024-06-18 NOTE — Therapy (Signed)
 OUTPATIENT PHYSICAL THERAPY NEURO TREATMENT   Patient Name: Austin [name] MRN: [id number] DOB:[birth year], 89 y.o., male Today's Date: 06/18/2024   PCP: Merna Huxley, NP  REFERRING PROVIDER: Merna Huxley, NP   END OF SESSION:  PT End of Session - 06/18/24 1411     Visit Number 3    Number of Visits 13    Date for Recertification  07/18/24    Authorization Type [employer]    Authorization Time Period 05/23/2024-07/18/2024    Authorization - Visit Number 3    Authorization - Number of Visits 13    PT Start Time 1410   late arrival   PT Stop Time 1445    PT Time Calculation (min) 35 min    Equipment Utilized During Treatment Gait belt    Activity Tolerance Patient tolerated treatment well    Behavior During Therapy WFL for tasks assessed/performed          Past Medical History:  Diagnosis Date   Acute diastolic CHF (congestive heart failure) (HCC) 09/17/2020   Acute exacerbation of CHF (congestive heart failure) (HCC) 08/04/2019   Acute right PCA stroke (HCC) 04/25/2024   ANEMIA DUE TO CHRONIC BLOOD LOSS 03/13/2007   CAROTID ARTERY STENOSIS 05/10/2010   Coronary artery disease    DIABETES MELLITUS, TYPE II 09/19/2007   DISEASE, CEREBROVASCULAR NEC 03/05/2007   ESRD (end stage renal disease) on dialysis (HCC)    GERD 03/13/2007   HYPERLIPIDEMIA 03/05/2007   HYPERTENSION 03/05/2007   HYPOKALEMIA 11/09/2009   KNEE PAIN, RIGHT 11/09/2009   New onset a-fib (HCC) 04/27/2024   Eliquis    PROSTATE CANCER, HX OF 03/05/2007   Past Surgical History:  Procedure Laterality Date   A/V FISTULAGRAM N/A 03/21/2024   Procedure: A/V Fistulagram;  Surgeon: Magda Debby SAILOR, MD;  Location: [facility];  Service: Cardiovascular;  Laterality: N/A;   AV FISTULA PLACEMENT Left 10/18/2022   Procedure: INSERTION OF LEFT ARM BRACHIAL ARTERY TO AXILLARY VEIN ARTERIOVENOUS (AV) GORE-TEX GRAFT;  Surgeon: Lanis Fonda BRAVO, MD;  Location: [facility] OR;  Service: Vascular;  Laterality: Left;    CAROTID ARTERY ANGIOPLASTY Right Oct. 10, 2001   ESOPHAGOGASTRODUODENOSCOPY (EGD) WITH PROPOFOL  N/A 11/04/2016   Procedure: ESOPHAGOGASTRODUODENOSCOPY (EGD) WITH PROPOFOL ;  Surgeon: Layla Lah, MD;  Location: [facility];  Service: Gastroenterology;  Laterality: N/A;   IR FLUORO GUIDE CV LINE RIGHT  10/25/2022   IR FLUORO GUIDE CV LINE RIGHT  10/27/2022   IR US  GUIDE VASC ACCESS RIGHT  10/25/2022   IR US  GUIDE VASC ACCESS RIGHT  10/27/2022   LAPAROSCOPIC APPENDECTOMY  02/20/2012   Procedure: APPENDECTOMY LAPAROSCOPIC;  Surgeon: Jina Nephew, MD;  Location: [facility] OR;  Service: General;  Laterality: N/A;   PROSTATE SURGERY     prostatectomy   Patient Active Problem List   Diagnosis Date Noted   New onset atrial fibrillation (HCC) 04/27/2024   Acute CVA (cerebrovascular accident) (HCC) 04/25/2024   Lumbar facet arthropathy 04/16/2024   Secondary hyperparathyroidism of renal origin 06/02/2023   Preoperative evaluation of a medical condition to rule out surgical contraindications (TAR required) 05/04/2023   Right carpal tunnel syndrome 03/16/2023   Coagulation defect, unspecified 11/07/2022   Personal history of nicotine dependence 11/01/2022   ESRD on dialysis (HCC) 10/29/2022   Protein-calorie malnutrition, severe 10/28/2022   Anemia in chronic kidney disease 10/28/2022   Hyperlipidemia, unspecified 10/28/2022   Pneumonia due to COVID-19 virus 10/25/2022   CKD (chronic kidney disease) stage 5, GFR less than 15 ml/min (HCC) 10/25/2022  Severe pulmonary hypertension (HCC) 10/25/2022   Moderate aortic regurgitation 10/25/2022   CAD in native artery 10/25/2022   UTI (urinary tract infection) 09/27/2021   CHF exacerbation (HCC) 09/26/2021   Acute respiratory failure with hypoxia (HCC) 09/26/2021   Acute on chronic diastolic CHF (congestive heart failure) (HCC) 07/09/2021   Dyspnea 06/01/2021   Upper airway cough syndrome 04/06/2021   Porokeratosis 03/26/2021   Pulmonary nodules 10/22/2020    Acute on chronic renal failure 10/09/2020   Mass of right lung 09/18/2020   Acute kidney injury superimposed on CKD 08/04/2019   Elevated troponin 08/04/2019   Hypokalemia 08/04/2019   Hypertensive urgency 03/28/2019   Elevated serum immunoglobulin free light chains 02/20/2019   Gastrointestinal hemorrhage with melena    Melena 11/03/2016   Carotid artery stenosis 05/10/2010   Type 2 diabetes mellitus with hyperlipidemia (HCC) 09/19/2007   Iron  deficiency anemia due to chronic blood loss 03/13/2007   GERD 03/13/2007   Essential hypertension 03/05/2007   DISEASE, CEREBROVASCULAR NEC 03/05/2007   PROSTATE CANCER, HX OF 03/05/2007    ONSET DATE: 04/25/24  REFERRING DIAG: I63.9 (ICD-10-CM) - Acute CVA (cerebrovascular accident) (HCC) R26.81 (ICD-10-CM) - Gait instability  THERAPY DIAG:  Muscle weakness (generalized)  Unsteadiness on feet  Difficulty in walking, not elsewhere classified  Rationale for Evaluation and Treatment: Rehabilitation  SUBJECTIVE:                                                                                                                                                                                             SUBJECTIVE STATEMENT: Doing ok, nothing new   Pt accompanied by: significant other and in waiting room  PERTINENT HISTORY: CHF, carotid artery stenosis, CAD, DMII, HLD, HTN, prostate CA, L AV fistula Dialysis: M, W, F  PAIN:  Are you having pain? No  PRECAUTIONS: Fall and Other: L AV fistula- no L UE BP   RED FLAGS: None   WEIGHT BEARING RESTRICTIONS: No  FALLS: Has patient fallen in last 6 months? No  LIVING ENVIRONMENT: Lives with: lives with their spouse Lives in: House/apartment Stairs: 3-4 steps to enter with railing; 2 story home Has following equipment at home: Vannie - 2 wheeled, Environmental Consultant - 4 wheeled, Wheelchair (manual), and quad tip cane  PLOF: Independent with basic ADLs and wife was assisting with shower transfers,  cooking, cleaning   PATIENT GOALS: maybe a little balance  OBJECTIVE:   TODAY'S TREATMENT: 06/18/24 Activity Comments  HEP review Supervision for veral/visual demonstration  Static balance -M-CTSIB -weight shift x 60 sec ant-post and lateral  NU-step level 4 x 8 min 3 min warm-up 30 sec  speed interval (65-70 SPM); 60 sec recovery pace (40-50)              M-CTSIB  Condition 1: Firm Surface, EO 30 Sec, Normal Sway  Condition 2: Firm Surface, EC 30 Sec, Normal and Mild Sway  Condition 3: Foam Surface, EO 30 Sec, Normal and Mild Sway  Condition 4: Foam Surface, EC 30 Sec, Mild Sway    TODAY'S TREATMENT: 06/13/2024 Activity Comments  Pre walk vitals:  O2 sats 97% HR 73 bpm 135/65   6 MWT distance:  365 ft   Post walk vitals:  O2 sats 93%, HR 85 143/66 Mild SOB  Counter balance exercises: Heel toe raises 2 x 10 Marching in place 2 x 10 Sidestepping, 3 reps Forward/back stepping, 3 reps Cues for foot clearance         PATIENT EDUCATION: Education details: HEP initiated Person educated: Patient Education method: Programmer, Multimedia, Facilities Manager, and Handouts Education comprehension: verbalized understanding, returned demonstration, and needs further education   HOME EXERCISE PROGRAM: Access Code: [email or web address] URL: [email or web address] Date: 06/13/2024 Prepared by: [facility] - Outpatient  Rehab - [facility]  Exercises - Heel Toe Raises with Counter Support  - 1 x daily - 4 x weekly - 2 sets - 10 reps - March in Place  - 1 x daily - 4 x weekly - 2 sets - 10 reps - Side Stepping with Counter Support  - 1 x daily - 4 x weekly - 1 sets - 3 reps  -------------------------------------------------------- Note: Objective measures were completed at Evaluation unless otherwise noted.  DIAGNOSTIC FINDINGS: 04/25/24 brain MRI: Acute infarct in the right PCA territory as above. 2. 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 3.  Additional remote lacunar infarcts in the left thalamus. 4. Focus of signal abnormality and susceptibility within the left parietal lobe, possibly representing remote hemorrhage or vascular malformation such as cavernous malformation. Additional similar focus of signal abnormality and susceptibility along the right dorsal aspect of the pons.  COGNITION: Overall cognitive status: difficult to discern; requires increased instruction for tasks occasionally    SENSATION: Pt reports N/T in UEs  COORDINATION: Alternating pronation/supination: slight dysmetria B Alternating toe tap: hypokinesia L>R Finger to nose: WNL B  MUSCLE TONE: WNL B LE  POSTURE: rounded shoulders, forward head, and shoulders elevations  LOWER EXTREMITY ROM:     Active  Right Eval Left Eval  Hip flexion    Hip extension    Hip abduction    Hip adduction    Hip internal rotation    Hip external rotation    Knee flexion    Knee extension    Ankle dorsiflexion 8 16  Ankle plantarflexion    Ankle inversion    Ankle eversion     (Blank rows = not tested)  LOWER EXTREMITY MMT:    MMT (in sitting) Right Eval Left Eval  Hip flexion 4+ 4+  Hip extension    Hip abduction 4- 4-  Hip adduction 4- 4-  Hip internal rotation    Hip external rotation    Knee flexion 3+ 3+  Knee extension 4+ 4  Ankle dorsiflexion 4 4  Ankle plantarflexion 4 4  Ankle inversion    Ankle eversion    (Blank rows = not tested)  GAIT: Findings: Assistive device utilized:quad tip cane, Level of assistance: SBA and CGA, and Comments: limited foot clearance B and tendency to shuffle feet; slowed *pt requires increased direction to navigate clinic environment; suspect  vision vs. Cognition as a factor   FUNCTIONAL TESTS:  5 times sit to stand: 23.8 sec pushing off LEs without standing fully  10 meter walk test: 17.23 sec with cane (1.9 ft/sec)                                                                                                                                 TREATMENT DATE: 05/23/24    PATIENT EDUCATION: Education details: edu on benefits of OT for vision, prognosis, POC, exam findings as they relate for functional impairments  Person educated: Patient and Spouse Education method: Explanation Education comprehension: verbalized understanding  HOME EXERCISE PROGRAM: Not yet initiated    GOALS: Goals reviewed with patient? Yes  SHORT TERM GOALS: Target date: 06/13/2024  Patient to be independent with initial HEP. Baseline: HEP initiated Goal status: INITIAL    LONG TERM GOALS: Target date: 07/18/2024  Patient to be independent with advanced HEP. Baseline: Not yet initiated  Goal status: INITIAL  to be improve to at least 400 ft for improved gait efficiency and endurance. Baseline: 365 ft Goal status: INITIAL  Patient to score at least 15/24 on DGI in order to decrease risk of falls.  Baseline: 7 Goal status: INITIAL  Patient to demonstrate 5xSTS test in <15 sec in order to decrease risk of falls.  Baseline: 23 sec Goal status: INITIAL  Patient to demonstrate gait speed of at least 2.3 ft/sec in order to improve access to community.  Baseline: 1.9 ft/sec Goal status: INITIAL   ASSESSMENT:  CLINICAL IMPRESSION: HEP review w/ supervision performance for verbal/visual cues/demonstration.  Static balance demands maintaining good postural stability w/ normal-mild imbalance to condition 2 and 4 M-CTSIB. NU-step for rapid alternating movement to improve coordination and reciprocal motion for gait. Continued sessions to advance POC details to improve mobility  OBJECTIVE IMPAIRMENTS: Abnormal gait, decreased activity tolerance, decreased balance, decreased coordination, decreased endurance, difficulty walking, decreased ROM, decreased strength, decreased safety awareness, impaired flexibility, and postural dysfunction.   ACTIVITY LIMITATIONS: carrying, lifting, bending, sitting,  standing, squatting, stairs, transfers, bed mobility, bathing, toileting, dressing, reach over head, hygiene/grooming, and locomotion level  PARTICIPATION LIMITATIONS: meal prep, cleaning, shopping, community activity, and church  PERSONAL FACTORS: Age, Fitness, Past/current experiences, Time since onset of injury/illness/exacerbation, and 3+ comorbidities: CHF, carotid artery stenosis, CAD, DMII, HLD, HTN, prostate CA, L AV fistula are also affecting patient's functional outcome.   REHAB POTENTIAL: Good  CLINICAL DECISION MAKING: Evolving/moderate complexity  EVALUATION COMPLEXITY: Moderate  PLAN:  PT FREQUENCY: 1-2x/week  PT DURATION: 6 weeks  PLANNED INTERVENTIONS: [id number]- PT Re-evaluation, 97750- Physical Performance Testing, 97110-Therapeutic exercises, 97530- Therapeutic activity, [id number]- Neuromuscular re-education, 97535- Self Care, [id number]- Manual therapy, [id number]- Gait training, [id number]- Canalith repositioning, Patient/Family education, Balance training, Stair training, Taping, Vestibular training, DME instructions, Cryotherapy, and Moist heat  PLAN FOR NEXT SESSION: Review HEP for BLE strength and balance and update HEP as appropriate. Faster walking w/ rollator (  uses one on dialysis days)    2:40 PM, 06/18/24 M. Kelly Annastasia Haskins, PT, DPT Physical Therapist- [place] Office Number: [phone]

## 2024-06-18 NOTE — Therapy (Signed)
 OUTPATIENT OCCUPATIONAL THERAPY NEURO  Treatment Note  Patient Name: Austin [name] MRN: [id number] DOB:[birth year], 89 y.o., male Today's Date: 06/18/2024  PCP: Merna Huxley, NP REFERRING PROVIDER: Merna Huxley, NP  END OF SESSION:  OT End of Session - 06/18/24 1458     Visit Number 2    Number of Visits 10    Date for Recertification  07/26/24    Authorization Type [employer] 2025    Authorization Time Period Auth#: [id number] , approved 10 OT visits from 06/13/2024 - 07/26/2024    Authorization - Visit Number 2    Authorization - Number of Visits 10    OT Start Time 1452    OT Stop Time 1532    OT Time Calculation (min) 40 min           Past Medical History:  Diagnosis Date   Acute diastolic CHF (congestive heart failure) (HCC) 09/17/2020   Acute exacerbation of CHF (congestive heart failure) (HCC) 08/04/2019   Acute right PCA stroke (HCC) 04/25/2024   ANEMIA DUE TO CHRONIC BLOOD LOSS 03/13/2007   CAROTID ARTERY STENOSIS 05/10/2010   Coronary artery disease    DIABETES MELLITUS, TYPE II 09/19/2007   DISEASE, CEREBROVASCULAR NEC 03/05/2007   ESRD (end stage renal disease) on dialysis (HCC)    GERD 03/13/2007   HYPERLIPIDEMIA 03/05/2007   HYPERTENSION 03/05/2007   HYPOKALEMIA 11/09/2009   KNEE PAIN, RIGHT 11/09/2009   New onset a-fib (HCC) 04/27/2024   Eliquis    PROSTATE CANCER, HX OF 03/05/2007   Past Surgical History:  Procedure Laterality Date   A/V FISTULAGRAM N/A 03/21/2024   Procedure: A/V Fistulagram;  Surgeon: Magda Debby SAILOR, MD;  Location: [facility];  Service: Cardiovascular;  Laterality: N/A;   AV FISTULA PLACEMENT Left 10/18/2022   Procedure: INSERTION OF LEFT ARM BRACHIAL ARTERY TO AXILLARY VEIN ARTERIOVENOUS (AV) GORE-TEX GRAFT;  Surgeon: Lanis Fonda BRAVO, MD;  Location: [facility] OR;  Service: Vascular;  Laterality: Left;   CAROTID ARTERY ANGIOPLASTY Right Oct. 10, 2001   ESOPHAGOGASTRODUODENOSCOPY (EGD) WITH PROPOFOL  N/A 11/04/2016   Procedure:  ESOPHAGOGASTRODUODENOSCOPY (EGD) WITH PROPOFOL ;  Surgeon: Layla Lah, MD;  Location: [facility];  Service: Gastroenterology;  Laterality: N/A;   IR FLUORO GUIDE CV LINE RIGHT  10/25/2022   IR FLUORO GUIDE CV LINE RIGHT  10/27/2022   IR US  GUIDE VASC ACCESS RIGHT  10/25/2022   IR US  GUIDE VASC ACCESS RIGHT  10/27/2022   LAPAROSCOPIC APPENDECTOMY  02/20/2012   Procedure: APPENDECTOMY LAPAROSCOPIC;  Surgeon: Jina Nephew, MD;  Location: [facility] OR;  Service: General;  Laterality: N/A;   PROSTATE SURGERY     prostatectomy   Patient Active Problem List   Diagnosis Date Noted   New onset atrial fibrillation (HCC) 04/27/2024   Acute CVA (cerebrovascular accident) (HCC) 04/25/2024   Lumbar facet arthropathy 04/16/2024   Secondary hyperparathyroidism of renal origin 06/02/2023   Preoperative evaluation of a medical condition to rule out surgical contraindications (TAR required) 05/04/2023   Right carpal tunnel syndrome 03/16/2023   Coagulation defect, unspecified 11/07/2022   Personal history of nicotine dependence 11/01/2022   ESRD on dialysis (HCC) 10/29/2022   Protein-calorie malnutrition, severe 10/28/2022   Anemia in chronic kidney disease 10/28/2022   Hyperlipidemia, unspecified 10/28/2022   Pneumonia due to COVID-19 virus 10/25/2022   CKD (chronic kidney disease) stage 5, GFR less than 15 ml/min (HCC) 10/25/2022   Severe pulmonary hypertension (HCC) 10/25/2022   Moderate aortic regurgitation 10/25/2022   CAD in native artery 10/25/2022   UTI (  urinary tract infection) 09/27/2021   CHF exacerbation (HCC) 09/26/2021   Acute respiratory failure with hypoxia (HCC) 09/26/2021   Acute on chronic diastolic CHF (congestive heart failure) (HCC) 07/09/2021   Dyspnea 06/01/2021   Upper airway cough syndrome 04/06/2021   Porokeratosis 03/26/2021   Pulmonary nodules 10/22/2020   Acute on chronic renal failure 10/09/2020   Mass of right lung 09/18/2020   Acute kidney injury superimposed on CKD  08/04/2019   Elevated troponin 08/04/2019   Hypokalemia 08/04/2019   Hypertensive urgency 03/28/2019   Elevated serum immunoglobulin free light chains 02/20/2019   Gastrointestinal hemorrhage with melena    Melena 11/03/2016   Carotid artery stenosis 05/10/2010   Type 2 diabetes mellitus with hyperlipidemia (HCC) 09/19/2007   Iron  deficiency anemia due to chronic blood loss 03/13/2007   GERD 03/13/2007   Essential hypertension 03/05/2007   DISEASE, CEREBROVASCULAR NEC 03/05/2007   PROSTATE CANCER, HX OF 03/05/2007    ONSET DATE: referral date 05/24/24  REFERRING DIAG: H53.9 (ICD-10-CM) - Visual disturbance  THERAPY DIAG:  Visuospatial deficit  Other symptoms and signs involving the nervous system  Rationale for Evaluation and Treatment: Rehabilitation  SUBJECTIVE:   SUBJECTIVE STATEMENT: Pt reports that he doesn't sleep well.  Pt accompanied by: self  PERTINENT HISTORY: Admitted to [facility] on 9/4 with visual changes over the last 4 days. MRI reveals acute R PCA infarct and remote R basal ganglia infarct with evidence of prior hemorrhage.   H/o: CHF, carotid artery stenosis, CAD, DMII, HLD, HTN, prostate CA, L AV fistula Dialysis: M, W, F  PRECAUTIONS: Fall and Other: L AV fistula- no L UE BP   WEIGHT BEARING RESTRICTIONS: No  PAIN:  Are you having pain? No  FALLS: Has patient fallen in last 6 months? No  LIVING ENVIRONMENT: Lives with: lives with their spouse Lives in: House/apartment Stairs: 3-4 steps to enter with railing; 2 story home with bedroom/full bath on the second floor Has following equipment at home: Vannie - 2 wheeled, Environmental Consultant - 4 wheeled, Wheelchair (manual), and quad tip cane  PLOF: Independent with basic ADLs and wife was assisting with shower transfers, cooking, cleaning   PATIENT GOALS: strategies to aid in vision; more energy to do things  OBJECTIVE:  Note: Objective measures were completed at Evaluation unless otherwise noted.  HAND DOMINANCE:  Right  ADLs: Transfers/ambulation related to ADLs: using quad tip cane and cues for L attention Eating: Mod I Grooming: some difficulty with shaving, but I manage UB Dressing: Mod I LB Dressing: Mod I Toileting: Mod I Bathing: occasional assist with washing back Tub Shower transfers: very difficult to get out of garden tub  IADLs: Shopping: wife completes Light housekeeping: wife completes Meal Prep: would make breakfast prior to stroke, but has not since Community mobility: not driving Medication management: spouse administers meds, has used a pill box in the past Financial management: wife completes  MOBILITY STATUS: utilizing quad tip cane   POSTURE COMMENTS:  rounded shoulders, forward head, and shoulders elevations   ACTIVITY TOLERANCE: Activity tolerance: reports decreased activity tolerance  UPPER EXTREMITY ROM:  WFL bilaterally   UPPER EXTREMITY MMT:   4+5 overall   COORDINATION: 9 Hole Peg test: Right: 47.44 sec; Left: 58.57 sec - pt picking up additional pegs to place due to decreased visual attention and looking for additional holes  SENSATION: WFL  COGNITION: Overall cognitive status: spouse reports that he gets confused more since the stroke than before  VISION: Subjective report: decreased attention to L Baseline  vision: Bifocals and Wears glasses all the time Visual history: cataracts, had those removed  VISION ASSESSMENT: Gaze preference/alignment: WDL Tracking/Visual pursuits: Decreased smoothness with horizontal tracking Saccades: undershoots, decreased speed of saccadic movements, and decreased scanning to L visual field Visual Fields: Left visual field deficits Confrontation testing: pt unable to identify stimulus in Left visual field with or without stimulus on Right Line bisection: pt correctly bisecting 4/6 lines, verbally stating where is the end of the line and turning head to scan full paper in 2 instances.  Patient has  difficulty with following activities due to following visual impairments: reading from medical list as pt frequently omitting words on far left  PERCEPTION: Impaired: Inattention/neglect: does not attend to left visual field  OBSERVATIONS: Pt requiring verbal cues for visual attention and scanning to Left while navigating through treatment space as pt initially not able to see treatment room on Left side of gym.                                                                                                                             TREATMENT DATE:  06/18/24 Bell cancellation: pt able to locate 32/35 bells with significantly increased time (7:30).  Starting at right side of paper and slowly scanning left.  Pt omitting 3 bells at far left portion of picture. Linear scanning: engaged in scanning lines one at a time to locate all 88, pt omitting 2 of 7, however benefiting from use of bookmark to occlude remaining lines to allow focus on one at a time.  OT applied colored anchor to left side of paper to facilitate visual attention and scanning to far left side of paper. Visual attention: engaged in I Spy activity to locate various shapes.  Pt requiring increased time, locating 3/4 suns, 5/6 ladybugs, and 2/2 butterflies.  OT applied colored anchor to left side of paper to facilitate visual attention and scanning to far left side of paper.   06/13/24 Visual attention: OT educating on lighthouse strategy providing with handout and education on head turning and scanning to L during ambulation and with self-care tasks to ensure attention to stimulus on L side.  OT educating pt and spouse on not rearranging furniture at this time due to decreased attention and memory, not wanting to increase fall risk unintentionally.     PATIENT EDUCATION: Education details: visual scanning and Left anchor during table top tasks Person educated: Patient and Spouse Education method: Explanation, Verbal cues, and  Handouts Education comprehension: verbalized understanding and needs further education  HOME EXERCISE PROGRAM: Visual spatial awareness - see pt instructions   GOALS: Goals reviewed with patient? Yes  SHORT TERM GOALS: Target date: 07/05/24  Pt and spouse with verbalize and demonstrate understanding of visual compensatory strategies. Baseline: Goal status: in progress  2.  Pt will be independent in visual exercises to increase awareness and vision in L visual field.  Baseline:  Goal status: in progress  3.  Pt  will complete table top scanning activity with min cues for technique. Baseline:  Goal status: in progress  4.  Pt will verbalize understanding of safe tub/shower transfers. Baseline:  Goal status: in progress   LONG TERM GOALS: Target date: 07/26/24  Pt will navigate a moderately busy environment, following multi-step commands with 90% accuracy Baseline:  Goal status: in progress  2.  Pt will complete table top scanning activity with Supervision in standing for increased visual attention and standing tolerance. Baseline:  Goal status: in progress  3.  Pt will demonstrate improved fine motor coordination for ADLs and visual attention to Left visual field as evidenced by decreasing 9 hole peg test score for BUE by 5 secs on RUE and 10 seconds on LUE.  Baseline: Right: 47.44 sec; Left: 58.57 sec Goal status: in progress  4.  Pt will verbalize and/or demonstrate understanding of energy conservation strategies to increase engagement in and safety with ADLs and IADLs.  Baseline:  Goal status: in progress    ASSESSMENT:  CLINICAL IMPRESSION: Patient is a 89 y.o. male who was seen today for occupational therapy treatment for visual deficits s/p CVA. Pt demonstrating impairments of L visual field, inattention, and decreased processing speed, also impacted by fatigue as pt nodding off during session.  Pt reports that he does not sleep well.  Pt continues to benefit  from verbal cues and anchor to scan to L during table top tasks.  Pt will continue to benefit from skilled occupational therapy services to address balance, GM/FM control, activity tolerance, safety awareness, introduction of compensatory strategies/AE prn, visual-perception, and implementation of an HEP to improve participation and safety during ADLs and IADLs.    PERFORMANCE DEFICITS: in functional skills including ADLs, IADLs, Fine motor control, balance, body mechanics, endurance, decreased knowledge of precautions, decreased knowledge of use of DME, and vision, cognitive skills including memory and safety awareness, and psychosocial skills including coping strategies, environmental adaptation, and routines and behaviors.     PLAN:  OT FREQUENCY: 2x/week for 3 weeks, then 1x/week for additional 3 weeks  OT DURATION: 6 weeks  PLANNED INTERVENTIONS: 97168 OT Re-evaluation, 97535 self care/ADL training, 02889 therapeutic exercise, 97530 therapeutic activity, 97112 neuromuscular re-education, functional mobility training, visual/perceptual remediation/compensation, psychosocial skills training, energy conservation, coping strategies training, patient/family education, and DME and/or AE instructions  RECOMMENDED OTHER SERVICES: NA  CONSULTED AND AGREED WITH PLAN OF CARE: Patient and family member/caregiver  PLAN FOR NEXT SESSION: complete Trail making, complete peg board pattern replication, educate on visual scanning during table top and with mobility.  Incorporate mobility with scavenger hunt type tasks    [place], LAURAINE, OTR/L 06/18/2024, 2:59 PM   [facility] at [facility] [address] [place], [address] [place], [place] [address] Phone # [phone] Fax # [phone]

## 2024-06-18 NOTE — Patient Instructions (Signed)
See me in 10-12 weeks 

## 2024-06-18 NOTE — Assessment & Plan Note (Signed)
 Patient has gone over a year without another injection at this time.  Discussed with patient about icing regimen and home exercises, discussed which activities to do and which ones to avoid.  Increase activity slowly.  Discussed icing regimen.  Follow-up again in 12 weeks

## 2024-06-18 NOTE — Assessment & Plan Note (Signed)
 Doing to see how patient responds to physical therapy.  Discussed icing regimen and home exercises as well.  No change in medications with patient being a relatively significant fall risk.  Already uses a cane to help with ambulation.  Follow-up with me again after trying physical therapy and if no further workup is necessary will discuss.

## 2024-06-19 DIAGNOSIS — N2581 Secondary hyperparathyroidism of renal origin: Secondary | ICD-10-CM | POA: Diagnosis not present

## 2024-06-19 DIAGNOSIS — Z992 Dependence on renal dialysis: Secondary | ICD-10-CM | POA: Diagnosis not present

## 2024-06-19 NOTE — Therapy (Signed)
 OUTPATIENT PHYSICAL THERAPY NEURO TREATMENT   Patient Name: Austin [name] MRN: [id number] DOB:1934/08/26, 89 y.o., male Today's Date: 06/20/2024   PCP: Merna Huxley, NP  REFERRING PROVIDER: Merna Huxley, NP   END OF SESSION:  PT End of Session - 06/20/24 1524     Visit Number 4    Number of Visits 13    Date for Recertification  07/18/24    Authorization Type [employer]    Authorization Time Period 05/23/2024-07/18/2024    Authorization - Visit Number 4    Authorization - Number of Visits 13    PT Start Time 1446    PT Stop Time 1530    PT Time Calculation (min) 44 min    Equipment Utilized During Treatment Gait belt    Activity Tolerance Patient tolerated treatment well    Behavior During Therapy WFL for tasks assessed/performed           Past Medical History:  Diagnosis Date   Acute diastolic CHF (congestive heart failure) (HCC) 09/17/2020   Acute exacerbation of CHF (congestive heart failure) (HCC) 08/04/2019   Acute right PCA stroke (HCC) 04/25/2024   ANEMIA DUE TO CHRONIC BLOOD LOSS 03/13/2007   CAROTID ARTERY STENOSIS 05/10/2010   Coronary artery disease    DIABETES MELLITUS, TYPE II 09/19/2007   DISEASE, CEREBROVASCULAR NEC 03/05/2007   ESRD (end stage renal disease) on dialysis (HCC)    GERD 03/13/2007   HYPERLIPIDEMIA 03/05/2007   HYPERTENSION 03/05/2007   HYPOKALEMIA 11/09/2009   KNEE PAIN, RIGHT 11/09/2009   New onset a-fib (HCC) 04/27/2024   Eliquis    PROSTATE CANCER, HX OF 03/05/2007   Past Surgical History:  Procedure Laterality Date   A/V FISTULAGRAM N/A 03/21/2024   Procedure: A/V Fistulagram;  Surgeon: Magda Debby SAILOR, MD;  Location: [facility];  Service: Cardiovascular;  Laterality: N/A;   AV FISTULA PLACEMENT Left 10/18/2022   Procedure: INSERTION OF LEFT ARM BRACHIAL ARTERY TO AXILLARY VEIN ARTERIOVENOUS (AV) GORE-TEX GRAFT;  Surgeon: Lanis Fonda BRAVO, MD;  Location: [facility] OR;  Service: Vascular;  Laterality: Left;   CAROTID ARTERY  ANGIOPLASTY Right Oct. 10, 2001   ESOPHAGOGASTRODUODENOSCOPY (EGD) WITH PROPOFOL  N/A 11/04/2016   Procedure: ESOPHAGOGASTRODUODENOSCOPY (EGD) WITH PROPOFOL ;  Surgeon: Layla Lah, MD;  Location: [facility];  Service: Gastroenterology;  Laterality: N/A;   IR FLUORO GUIDE CV LINE RIGHT  10/25/2022   IR FLUORO GUIDE CV LINE RIGHT  10/27/2022   IR US  GUIDE VASC ACCESS RIGHT  10/25/2022   IR US  GUIDE VASC ACCESS RIGHT  10/27/2022   LAPAROSCOPIC APPENDECTOMY  02/20/2012   Procedure: APPENDECTOMY LAPAROSCOPIC;  Surgeon: Jina Nephew, MD;  Location: [facility] OR;  Service: General;  Laterality: N/A;   PROSTATE SURGERY     prostatectomy   Patient Active Problem List   Diagnosis Date Noted   New onset atrial fibrillation (HCC) 04/27/2024   Acute CVA (cerebrovascular accident) (HCC) 04/25/2024   Lumbar facet arthropathy 04/16/2024   Secondary hyperparathyroidism of renal origin 06/02/2023   Preoperative evaluation of a medical condition to rule out surgical contraindications (TAR required) 05/04/2023   Right carpal tunnel syndrome 03/16/2023   Coagulation defect, unspecified 11/07/2022   Personal history of nicotine dependence 11/01/2022   ESRD on dialysis (HCC) 10/29/2022   Protein-calorie malnutrition, severe 10/28/2022   Anemia in chronic kidney disease 10/28/2022   Hyperlipidemia, unspecified 10/28/2022   Pneumonia due to COVID-19 virus 10/25/2022   CKD (chronic kidney disease) stage 5, GFR less than 15 ml/min (HCC) 10/25/2022   Severe pulmonary  hypertension (HCC) 10/25/2022   Moderate aortic regurgitation 10/25/2022   CAD in native artery 10/25/2022   UTI (urinary tract infection) 09/27/2021   CHF exacerbation (HCC) 09/26/2021   Acute respiratory failure with hypoxia (HCC) 09/26/2021   Acute on chronic diastolic CHF (congestive heart failure) (HCC) 07/09/2021   Dyspnea 06/01/2021   Upper airway cough syndrome 04/06/2021   Porokeratosis 03/26/2021   Pulmonary nodules 10/22/2020   Acute on  chronic renal failure 10/09/2020   Mass of right lung 09/18/2020   Acute kidney injury superimposed on CKD 08/04/2019   Elevated troponin 08/04/2019   Hypokalemia 08/04/2019   Hypertensive urgency 03/28/2019   Elevated serum immunoglobulin free light chains 02/20/2019   Gastrointestinal hemorrhage with melena    Melena 11/03/2016   Carotid artery stenosis 05/10/2010   Type 2 diabetes mellitus with hyperlipidemia (HCC) 09/19/2007   Iron  deficiency anemia due to chronic blood loss 03/13/2007   GERD 03/13/2007   Essential hypertension 03/05/2007   DISEASE, CEREBROVASCULAR NEC 03/05/2007   PROSTATE CANCER, HX OF 03/05/2007    ONSET DATE: 04/25/24  REFERRING DIAG: I63.9 (ICD-10-CM) - Acute CVA (cerebrovascular accident) (HCC) R26.81 (ICD-10-CM) - Gait instability  THERAPY DIAG:  Muscle weakness (generalized)  Unsteadiness on feet  Difficulty in walking, not elsewhere classified  Rationale for Evaluation and Treatment: Rehabilitation  SUBJECTIVE:                                                                                                                                                                                             SUBJECTIVE STATEMENT: Doing ok, nothing new   Pt accompanied by: significant other and in waiting room  PERTINENT HISTORY: CHF, carotid artery stenosis, CAD, DMII, HLD, HTN, prostate CA, L AV fistula Dialysis: M, W, F  PAIN:  Are you having pain? No  PRECAUTIONS: Fall and Other: L AV fistula- no L UE BP   RED FLAGS: None   WEIGHT BEARING RESTRICTIONS: No  FALLS: Has patient fallen in last 6 months? No  LIVING ENVIRONMENT: Lives with: lives with their spouse Lives in: House/apartment Stairs: 3-4 steps to enter with railing; 2 story home Has following equipment at home: Vannie - 2 wheeled, Environmental Consultant - 4 wheeled, Wheelchair (manual), and quad tip cane  PLOF: Independent with basic ADLs and wife was assisting with shower transfers, cooking,  cleaning   PATIENT GOALS: maybe a little balance  OBJECTIVE:     TODAY'S TREATMENT: 06/20/24 Activity Comments  Nustep L4 x 6 min UEs/LEs Dynamic warm up ; cueing to maintain at least 60SPM  Review of HEP: - Heel Toe Raises with Counter Support  - 1  x daily - 4 x weekly - 2 sets - 10 reps - March in Place  - 1 x daily - 4 x weekly - 2 sets - 10 reps - Side Stepping with Counter Support  - 1 x daily - 4 x weekly - 1 sets - 3 reps Required cueing for larger amplitude movement and to maintain amplitude   gait training with quad tip cane and 4WW with cueing for pacing 363ft Cueing for safe use of walker: keeping elbows bent, feet within wheels, improved foot clearance   gait training with quad tip cane, ambulating around and over cones/bean bags Requires cueing for direction changes, navigation, staying on task  Standing hip ABD 2x10 Cues to stand up tall, proper line of kick. Improved form with visual targets          HOME EXERCISE PROGRAM: Access Code: [email or web address] URL: [email or web address] Date: 06/13/2024 Prepared by: [facility] - Outpatient  Rehab - [facility]  Exercises - Heel Toe Raises with Counter Support  - 1 x daily - 4 x weekly - 2 sets - 10 reps - March in Place  - 1 x daily - 4 x weekly - 2 sets - 10 reps - Side Stepping with Counter Support  - 1 x daily - 4 x weekly - 1 sets - 3 reps  -------------------------------------------------------- Note: Objective measures were completed at Evaluation unless otherwise noted.  DIAGNOSTIC FINDINGS: 04/25/24 brain MRI: Acute infarct in the right PCA territory as above. 2. 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 3. Additional remote lacunar infarcts in the left thalamus. 4. Focus of signal abnormality and susceptibility within the left parietal lobe, possibly representing remote hemorrhage or vascular malformation such as cavernous malformation. Additional similar focus of  signal abnormality and susceptibility along the right dorsal aspect of the pons.  COGNITION: Overall cognitive status: difficult to discern; requires increased instruction for tasks occasionally    SENSATION: Pt reports N/T in UEs  COORDINATION: Alternating pronation/supination: slight dysmetria B Alternating toe tap: hypokinesia L>R Finger to nose: WNL B  MUSCLE TONE: WNL B LE  POSTURE: rounded shoulders, forward head, and shoulders elevations  LOWER EXTREMITY ROM:     Active  Right Eval Left Eval  Hip flexion    Hip extension    Hip abduction    Hip adduction    Hip internal rotation    Hip external rotation    Knee flexion    Knee extension    Ankle dorsiflexion 8 16  Ankle plantarflexion    Ankle inversion    Ankle eversion     (Blank rows = not tested)  LOWER EXTREMITY MMT:    MMT (in sitting) Right Eval Left Eval  Hip flexion 4+ 4+  Hip extension    Hip abduction 4- 4-  Hip adduction 4- 4-  Hip internal rotation    Hip external rotation    Knee flexion 3+ 3+  Knee extension 4+ 4  Ankle dorsiflexion 4 4  Ankle plantarflexion 4 4  Ankle inversion    Ankle eversion    (Blank rows = not tested)  GAIT: Findings: Assistive device utilized:quad tip cane, Level of assistance: SBA and CGA, and Comments: limited foot clearance B and tendency to shuffle feet; slowed *pt requires increased direction to navigate clinic environment; suspect vision vs. Cognition as a factor   FUNCTIONAL TESTS:  5 times sit to stand: 23.8 sec pushing off LEs without standing fully  10 meter walk  test: 17.23 sec with cane (1.9 ft/sec)                                                                                                                                TREATMENT DATE: 05/23/24    PATIENT EDUCATION: Education details: edu on benefits of OT for vision, prognosis, POC, exam findings as they relate for functional impairments  Person educated: Patient and  Spouse Education method: Explanation Education comprehension: verbalized understanding  HOME EXERCISE PROGRAM: Not yet initiated    GOALS: Goals reviewed with patient? Yes  SHORT TERM GOALS: Target date: 06/13/2024  Patient to be independent with initial HEP. Baseline: HEP initiated;pt requires cueing Goal status: partially MET    LONG TERM GOALS: Target date: 07/18/2024  Patient to be independent with advanced HEP. Baseline: Not yet initiated  Goal status: IN PROGRESS  to be improve to at least 400 ft for improved gait efficiency and endurance. Baseline: 365 ft Goal status: IN PROGRESS  Patient to score at least 15/24 on DGI in order to decrease risk of falls.  Baseline: 7 Goal status: IN PROGRESS  Patient to demonstrate 5xSTS test in <15 sec in order to decrease risk of falls.  Baseline: 23 sec Goal status: IN PROGRESS  Patient to demonstrate gait speed of at least 2.3 ft/sec in order to improve access to community.  Baseline: 1.9 ft/sec Goal status: IN PROGRESS   ASSESSMENT:  CLINICAL IMPRESSION: Patient arrived to session without complaints. Session focused on STGs check and review of HEP. Gait training focused on pacing and safe navigation around and over obstacles. Patient is slow-moving and requires frequent redirection to keep on task, thus activity was limited in session today. Patient tolerated session well and without complaints at end of appointment.  OBJECTIVE IMPAIRMENTS: Abnormal gait, decreased activity tolerance, decreased balance, decreased coordination, decreased endurance, difficulty walking, decreased ROM, decreased strength, decreased safety awareness, impaired flexibility, and postural dysfunction.   ACTIVITY LIMITATIONS: carrying, lifting, bending, sitting, standing, squatting, stairs, transfers, bed mobility, bathing, toileting, dressing, reach over head, hygiene/grooming, and locomotion level  PARTICIPATION LIMITATIONS: meal prep,  cleaning, shopping, community activity, and church  PERSONAL FACTORS: Age, Fitness, Past/current experiences, Time since onset of injury/illness/exacerbation, and 3+ comorbidities: CHF, carotid artery stenosis, CAD, DMII, HLD, HTN, prostate CA, L AV fistula are also affecting patient's functional outcome.   REHAB POTENTIAL: Good  CLINICAL DECISION MAKING: Evolving/moderate complexity  EVALUATION COMPLEXITY: Moderate  PLAN:  PT FREQUENCY: 1-2x/week  PT DURATION: 6 weeks  PLANNED INTERVENTIONS: 97164- PT Re-evaluation, 97750- Physical Performance Testing, 97110-Therapeutic exercises, 97530- Therapeutic activity, [id number]- Neuromuscular re-education, 97535- Self Care, [id number]- Manual therapy, [id number]- Gait training, [phone]- Canalith repositioning, Patient/Family education, Balance training, Stair training, Taping, Vestibular training, DME instructions, Cryotherapy, and Moist heat  PLAN FOR NEXT SESSION: Review HEP for BLE strength and balance and update HEP as appropriate. Faster walking w/ rollator (uses one on dialysis days)     Demon Volante Kovalenko  Campbell, PT, DPT 06/20/24 3:31 PM  [facility] [address] [place], [address] [place], [place] [address] Phone # [phone] Fax # [phone]

## 2024-06-20 ENCOUNTER — Ambulatory Visit: Admitting: Occupational Therapy

## 2024-06-20 ENCOUNTER — Ambulatory Visit: Admitting: Physical Therapy

## 2024-06-20 ENCOUNTER — Encounter: Payer: Self-pay | Admitting: Physical Therapy

## 2024-06-20 DIAGNOSIS — R29818 Other symptoms and signs involving the nervous system: Secondary | ICD-10-CM | POA: Diagnosis not present

## 2024-06-20 DIAGNOSIS — I10 Essential (primary) hypertension: Secondary | ICD-10-CM | POA: Diagnosis not present

## 2024-06-20 DIAGNOSIS — R41842 Visuospatial deficit: Secondary | ICD-10-CM

## 2024-06-20 DIAGNOSIS — R2681 Unsteadiness on feet: Secondary | ICD-10-CM | POA: Diagnosis not present

## 2024-06-20 DIAGNOSIS — I5032 Chronic diastolic (congestive) heart failure: Secondary | ICD-10-CM | POA: Diagnosis not present

## 2024-06-20 DIAGNOSIS — M6281 Muscle weakness (generalized): Secondary | ICD-10-CM | POA: Diagnosis not present

## 2024-06-20 DIAGNOSIS — I639 Cerebral infarction, unspecified: Secondary | ICD-10-CM | POA: Diagnosis not present

## 2024-06-20 DIAGNOSIS — R262 Difficulty in walking, not elsewhere classified: Secondary | ICD-10-CM | POA: Diagnosis not present

## 2024-06-20 DIAGNOSIS — I351 Nonrheumatic aortic (valve) insufficiency: Secondary | ICD-10-CM | POA: Diagnosis not present

## 2024-06-20 DIAGNOSIS — I350 Nonrheumatic aortic (valve) stenosis: Secondary | ICD-10-CM | POA: Diagnosis not present

## 2024-06-20 NOTE — Therapy (Signed)
 OUTPATIENT OCCUPATIONAL THERAPY NEURO  Treatment Note  Patient Name: Austin [name] MRN: [id number] DOB:1934/11/22, 89 y.o., male Today's Date: 06/20/2024  PCP: Merna Huxley, NP REFERRING PROVIDER: Merna Huxley, NP  END OF SESSION:  OT End of Session - 06/20/24 1439     Visit Number 3    Number of Visits 10    Date for Recertification  07/26/24    Authorization Type [employer] 2025    Authorization Time Period Auth#: [id number] , approved 10 OT visits from 06/13/2024 - 07/26/2024    Authorization - Visit Number 3    Authorization - Number of Visits 10    OT Start Time 1405    OT Stop Time 1445    OT Time Calculation (min) 40 min            Past Medical History:  Diagnosis Date   Acute diastolic CHF (congestive heart failure) (HCC) 09/17/2020   Acute exacerbation of CHF (congestive heart failure) (HCC) 08/04/2019   Acute right PCA stroke (HCC) 04/25/2024   ANEMIA DUE TO CHRONIC BLOOD LOSS 03/13/2007   CAROTID ARTERY STENOSIS 05/10/2010   Coronary artery disease    DIABETES MELLITUS, TYPE II 09/19/2007   DISEASE, CEREBROVASCULAR NEC 03/05/2007   ESRD (end stage renal disease) on dialysis (HCC)    GERD 03/13/2007   HYPERLIPIDEMIA 03/05/2007   HYPERTENSION 03/05/2007   HYPOKALEMIA 11/09/2009   KNEE PAIN, RIGHT 11/09/2009   New onset a-fib (HCC) 04/27/2024   Eliquis    PROSTATE CANCER, HX OF 03/05/2007   Past Surgical History:  Procedure Laterality Date   A/V FISTULAGRAM N/A 03/21/2024   Procedure: A/V Fistulagram;  Surgeon: Magda Debby SAILOR, MD;  Location: [facility];  Service: Cardiovascular;  Laterality: N/A;   AV FISTULA PLACEMENT Left 10/18/2022   Procedure: INSERTION OF LEFT ARM BRACHIAL ARTERY TO AXILLARY VEIN ARTERIOVENOUS (AV) GORE-TEX GRAFT;  Surgeon: Lanis Fonda BRAVO, MD;  Location: [facility] OR;  Service: Vascular;  Laterality: Left;   CAROTID ARTERY ANGIOPLASTY Right Oct. 10, 2001   ESOPHAGOGASTRODUODENOSCOPY (EGD) WITH PROPOFOL  N/A 11/04/2016   Procedure:  ESOPHAGOGASTRODUODENOSCOPY (EGD) WITH PROPOFOL ;  Surgeon: Layla Lah, MD;  Location: [facility];  Service: Gastroenterology;  Laterality: N/A;   IR FLUORO GUIDE CV LINE RIGHT  10/25/2022   IR FLUORO GUIDE CV LINE RIGHT  10/27/2022   IR US  GUIDE VASC ACCESS RIGHT  10/25/2022   IR US  GUIDE VASC ACCESS RIGHT  10/27/2022   LAPAROSCOPIC APPENDECTOMY  02/20/2012   Procedure: APPENDECTOMY LAPAROSCOPIC;  Surgeon: Jina Nephew, MD;  Location: [facility] OR;  Service: General;  Laterality: N/A;   PROSTATE SURGERY     prostatectomy   Patient Active Problem List   Diagnosis Date Noted   New onset atrial fibrillation (HCC) 04/27/2024   Acute CVA (cerebrovascular accident) (HCC) 04/25/2024   Lumbar facet arthropathy 04/16/2024   Secondary hyperparathyroidism of renal origin 06/02/2023   Preoperative evaluation of a medical condition to rule out surgical contraindications (TAR required) 05/04/2023   Right carpal tunnel syndrome 03/16/2023   Coagulation defect, unspecified 11/07/2022   Personal history of nicotine dependence 11/01/2022   ESRD on dialysis (HCC) 10/29/2022   Protein-calorie malnutrition, severe 10/28/2022   Anemia in chronic kidney disease 10/28/2022   Hyperlipidemia, unspecified 10/28/2022   Pneumonia due to COVID-19 virus 10/25/2022   CKD (chronic kidney disease) stage 5, GFR less than 15 ml/min (HCC) 10/25/2022   Severe pulmonary hypertension (HCC) 10/25/2022   Moderate aortic regurgitation 10/25/2022   CAD in native artery 10/25/2022  UTI (urinary tract infection) 09/27/2021   CHF exacerbation (HCC) 09/26/2021   Acute respiratory failure with hypoxia (HCC) 09/26/2021   Acute on chronic diastolic CHF (congestive heart failure) (HCC) 07/09/2021   Dyspnea 06/01/2021   Upper airway cough syndrome 04/06/2021   Porokeratosis 03/26/2021   Pulmonary nodules 10/22/2020   Acute on chronic renal failure 10/09/2020   Mass of right lung 09/18/2020   Acute kidney injury superimposed on CKD  08/04/2019   Elevated troponin 08/04/2019   Hypokalemia 08/04/2019   Hypertensive urgency 03/28/2019   Elevated serum immunoglobulin free light chains 02/20/2019   Gastrointestinal hemorrhage with melena    Melena 11/03/2016   Carotid artery stenosis 05/10/2010   Type 2 diabetes mellitus with hyperlipidemia (HCC) 09/19/2007   Iron  deficiency anemia due to chronic blood loss 03/13/2007   GERD 03/13/2007   Essential hypertension 03/05/2007   DISEASE, CEREBROVASCULAR NEC 03/05/2007   PROSTATE CANCER, HX OF 03/05/2007    ONSET DATE: referral date 05/24/24  REFERRING DIAG: H53.9 (ICD-10-CM) - Visual disturbance  THERAPY DIAG:  Visuospatial deficit  Other symptoms and signs involving the nervous system  Rationale for Evaluation and Treatment: Rehabilitation  SUBJECTIVE:   SUBJECTIVE STATEMENT: Pt reports working on some of his worksheets.   Pt accompanied by: self  PERTINENT HISTORY: Admitted to [facility] on 9/4 with visual changes over the last 4 days. MRI reveals acute R PCA infarct and remote R basal ganglia infarct with evidence of prior hemorrhage.   H/o: CHF, carotid artery stenosis, CAD, DMII, HLD, HTN, prostate CA, L AV fistula Dialysis: M, W, F  PRECAUTIONS: Fall and Other: L AV fistula- no L UE BP   WEIGHT BEARING RESTRICTIONS: No  PAIN:  Are you having pain? No  FALLS: Has patient fallen in last 6 months? No  LIVING ENVIRONMENT: Lives with: lives with their spouse Lives in: House/apartment Stairs: 3-4 steps to enter with railing; 2 story home with bedroom/full bath on the second floor Has following equipment at home: Vannie - 2 wheeled, Environmental Consultant - 4 wheeled, Wheelchair (manual), and quad tip cane  PLOF: Independent with basic ADLs and wife was assisting with shower transfers, cooking, cleaning   PATIENT GOALS: strategies to aid in vision; more energy to do things  OBJECTIVE:  Note: Objective measures were completed at Evaluation unless otherwise noted.  HAND  DOMINANCE: Right  ADLs: Transfers/ambulation related to ADLs: using quad tip cane and cues for L attention Eating: Mod I Grooming: some difficulty with shaving, but I manage UB Dressing: Mod I LB Dressing: Mod I Toileting: Mod I Bathing: occasional assist with washing back Tub Shower transfers: very difficult to get out of garden tub  IADLs: Shopping: wife completes Light housekeeping: wife completes Meal Prep: would make breakfast prior to stroke, but has not since Community mobility: not driving Medication management: spouse administers meds, has used a pill box in the past Financial management: wife completes  MOBILITY STATUS: utilizing quad tip cane   POSTURE COMMENTS:  rounded shoulders, forward head, and shoulders elevations   ACTIVITY TOLERANCE: Activity tolerance: reports decreased activity tolerance  UPPER EXTREMITY ROM:  WFL bilaterally   UPPER EXTREMITY MMT:   4+5 overall   COORDINATION: 9 Hole Peg test: Right: 47.44 sec; Left: 58.57 sec - pt picking up additional pegs to place due to decreased visual attention and looking for additional holes  SENSATION: WFL  COGNITION: Overall cognitive status: spouse reports that he gets confused more since the stroke than before  VISION: Subjective report: decreased attention  to L Baseline vision: Bifocals and Wears glasses all the time Visual history: cataracts, had those removed  VISION ASSESSMENT: Gaze preference/alignment: WDL Tracking/Visual pursuits: Decreased smoothness with horizontal tracking Saccades: undershoots, decreased speed of saccadic movements, and decreased scanning to L visual field Visual Fields: Left visual field deficits Confrontation testing: pt unable to identify stimulus in Left visual field with or without stimulus on Right Line bisection: pt correctly bisecting 4/6 lines, verbally stating where is the end of the line and turning head to scan full paper in 2 instances.  Patient  has difficulty with following activities due to following visual impairments: reading from medical list as pt frequently omitting words on far left  PERCEPTION: Impaired: Inattention/neglect: does not attend to left visual field  OBSERVATIONS: Pt requiring verbal cues for visual attention and scanning to Left while navigating through treatment space as pt initially not able to see treatment room on Left side of gym.                                                                                                                             TREATMENT DATE:  06/20/24 Visual attention: engaged in visual scanning and attention to complete peg board pattern replication.  Pattern and peg board placed at midline and pegs placed on L to facilitate visual scanning to L side.  Pt completing R side of pattern, however requiring increased time and verbal cues to scan to far left corner of pegboard when placing pegs left of midline. OT providing cues and visual target on L to increase attention to L, encouraging pt to utilize L hand as marker to visually scan towards.  Reviewed worksheets: pt omitting one number when scanning from 1-20, number 12 was in far left visual field.  Pt omitting 9 of 21 88  when scanning lines, 8/9 at midline and left of midline.  Completed 88 scanning worksheet again with pt able to locate 8 of 9 initially omitted numbers.   Visual scanning: locating additional 3 items in I Spy activity.  Pt requiring increased time to locate, however with use of visual anchor on L, pt with improved ability to locate 6/6 volcanoes, 4/4 leaves, and 2/2 waves.     06/18/24 Bell cancellation: pt able to locate 32/35 bells with significantly increased time (7:30).  Starting at right side of paper and slowly scanning left.  Pt omitting 3 bells at far left portion of picture. Linear scanning: engaged in scanning lines one at a time to locate all 88, pt omitting 2 of 7, however benefiting from use of  bookmark to occlude remaining lines to allow focus on one at a time.  OT applied colored anchor to left side of paper to facilitate visual attention and scanning to far left side of paper. Visual attention: engaged in I Spy activity to locate various shapes.  Pt requiring increased time, locating 3/4 suns, 5/6 ladybugs, and 2/2 butterflies.  OT applied colored anchor to  left side of paper to facilitate visual attention and scanning to far left side of paper.   06/13/24 Visual attention: OT educating on lighthouse strategy providing with handout and education on head turning and scanning to L during ambulation and with self-care tasks to ensure attention to stimulus on L side.  OT educating pt and spouse on not rearranging furniture at this time due to decreased attention and memory, not wanting to increase fall risk unintentionally.     PATIENT EDUCATION: Education details: visual scanning and Left anchor during table top tasks Person educated: Patient and Spouse Education method: Explanation, Verbal cues, and Handouts Education comprehension: verbalized understanding and needs further education  HOME EXERCISE PROGRAM: Visual spatial awareness - see pt instructions   GOALS: Goals reviewed with patient? Yes  SHORT TERM GOALS: Target date: 07/05/24  Pt and spouse with verbalize and demonstrate understanding of visual compensatory strategies. Baseline: Goal status: in progress  2.  Pt will be independent in visual exercises to increase awareness and vision in L visual field.  Baseline:  Goal status: in progress  3.  Pt will complete table top scanning activity with min cues for technique. Baseline:  Goal status: in progress  4.  Pt will verbalize understanding of safe tub/shower transfers. Baseline:  Goal status: in progress   LONG TERM GOALS: Target date: 07/26/24  Pt will navigate a moderately busy environment, following multi-step commands with 90% accuracy Baseline:   Goal status: in progress  2.  Pt will complete table top scanning activity with Supervision in standing for increased visual attention and standing tolerance. Baseline:  Goal status: in progress  3.  Pt will demonstrate improved fine motor coordination for ADLs and visual attention to Left visual field as evidenced by decreasing 9 hole peg test score for BUE by 5 secs on RUE and 10 seconds on LUE.  Baseline: Right: 47.44 sec; Left: 58.57 sec Goal status: in progress  4.  Pt will verbalize and/or demonstrate understanding of energy conservation strategies to increase engagement in and safety with ADLs and IADLs.  Baseline:  Goal status: in progress    ASSESSMENT:  CLINICAL IMPRESSION: Patient is a 89 y.o. male who was seen today for occupational therapy treatment for visual deficits s/p CVA. Pt demonstrating impairments of L visual field and inattention.  Pt much more awake and alert this session!  Pt continues to benefit from verbal cues and anchor to scan to L during table top tasks with ability for therapist to fade cues from mod-max to min.  Pt will continue to benefit from skilled occupational therapy services to address balance, GM/FM control, activity tolerance, safety awareness, introduction of compensatory strategies/AE prn, visual-perception, and implementation of an HEP to improve participation and safety during ADLs and IADLs.    PERFORMANCE DEFICITS: in functional skills including ADLs, IADLs, Fine motor control, balance, body mechanics, endurance, decreased knowledge of precautions, decreased knowledge of use of DME, and vision, cognitive skills including memory and safety awareness, and psychosocial skills including coping strategies, environmental adaptation, and routines and behaviors.     PLAN:  OT FREQUENCY: 2x/week for 3 weeks, then 1x/week for additional 3 weeks  OT DURATION: 6 weeks  PLANNED INTERVENTIONS: 97168 OT Re-evaluation, 97535 self care/ADL training,  97110 therapeutic exercise, 97530 therapeutic activity, 97112 neuromuscular re-education, functional mobility training, visual/perceptual remediation/compensation, psychosocial skills training, energy conservation, coping strategies training, patient/family education, and DME and/or AE instructions  RECOMMENDED OTHER SERVICES: NA  CONSULTED AND AGREED WITH PLAN OF CARE: Patient  and family member/caregiver  PLAN FOR NEXT SESSION: complete Trail making, complete peg board pattern replication with small pegs, educate on visual scanning during table top and with mobility.  Incorporate mobility with scavenger hunt type tasks    Bode, LAURAINE, OTR/L 06/20/2024, 2:39 PM   [facility] [address] [place], [address] [place], [place] [address] Phone # [phone] Fax # [phone]

## 2024-06-21 DIAGNOSIS — E1022 Type 1 diabetes mellitus with diabetic chronic kidney disease: Secondary | ICD-10-CM | POA: Diagnosis not present

## 2024-06-21 DIAGNOSIS — N2581 Secondary hyperparathyroidism of renal origin: Secondary | ICD-10-CM | POA: Diagnosis not present

## 2024-06-21 DIAGNOSIS — Z992 Dependence on renal dialysis: Secondary | ICD-10-CM | POA: Diagnosis not present

## 2024-06-21 DIAGNOSIS — N186 End stage renal disease: Secondary | ICD-10-CM | POA: Diagnosis not present

## 2024-06-24 DIAGNOSIS — N186 End stage renal disease: Secondary | ICD-10-CM | POA: Diagnosis not present

## 2024-06-24 DIAGNOSIS — Z992 Dependence on renal dialysis: Secondary | ICD-10-CM | POA: Diagnosis not present

## 2024-06-24 DIAGNOSIS — N2581 Secondary hyperparathyroidism of renal origin: Secondary | ICD-10-CM | POA: Diagnosis not present

## 2024-06-25 ENCOUNTER — Ambulatory Visit: Admitting: Occupational Therapy

## 2024-06-25 ENCOUNTER — Encounter: Payer: Self-pay | Admitting: Primary Care

## 2024-06-25 ENCOUNTER — Ambulatory Visit: Attending: Adult Health

## 2024-06-25 ENCOUNTER — Ambulatory Visit: Admitting: Primary Care

## 2024-06-25 VITALS — BP 134/66 | HR 83 | Temp 97.7°F | Ht 67.0 in | Wt 175.4 lb

## 2024-06-25 DIAGNOSIS — R29818 Other symptoms and signs involving the nervous system: Secondary | ICD-10-CM

## 2024-06-25 DIAGNOSIS — R41842 Visuospatial deficit: Secondary | ICD-10-CM | POA: Insufficient documentation

## 2024-06-25 DIAGNOSIS — R2681 Unsteadiness on feet: Secondary | ICD-10-CM | POA: Insufficient documentation

## 2024-06-25 DIAGNOSIS — R262 Difficulty in walking, not elsewhere classified: Secondary | ICD-10-CM | POA: Diagnosis not present

## 2024-06-25 DIAGNOSIS — R918 Other nonspecific abnormal finding of lung field: Secondary | ICD-10-CM

## 2024-06-25 DIAGNOSIS — M6281 Muscle weakness (generalized): Secondary | ICD-10-CM | POA: Diagnosis not present

## 2024-06-25 DIAGNOSIS — Z87891 Personal history of nicotine dependence: Secondary | ICD-10-CM | POA: Diagnosis not present

## 2024-06-25 NOTE — Patient Instructions (Signed)
  VISIT SUMMARY: You came in today for a follow-up appointment regarding your pulmonary nodules. A recent CT scan showed a slight increase in the size of a nodule in your right upper lung. You do not have any symptoms like weight loss, coughing up blood, persistent cough, or fever. We discussed your family history and your concerns about the risk of lung cancer or mesothelioma.  YOUR PLAN: -SUSPICIOUS RIGHT UPPER LOBE LUNG NODULE WITH INTERVAL GROWTH: The nodule in your right upper lung has grown slightly, which raises concerns about the possibility of lung cancer or mesothelioma, especially given your family history. We have ordered a PET scan to get more information about the nodule's activity. Depending on the results, we may discuss a biopsy.   INSTRUCTIONS: Please schedule a PET scan as soon as possible. We have also set up a follow-up appointment with Dr. Shelah  Follow-up Please schedule an apt with Dr. Fayetta early December

## 2024-06-25 NOTE — Therapy (Signed)
 OUTPATIENT PHYSICAL THERAPY NEURO TREATMENT   Patient Name: Lochlann Mastrangelo MRN: [id number] DOB:1934/11/13, 89 y.o., male Today's Date: 06/25/2024   PCP: Merna Huxley, NP  REFERRING PROVIDER: Merna Huxley, NP   END OF SESSION:  PT End of Session - 06/25/24 1358     Visit Number 5    Number of Visits 13    Date for Recertification  07/18/24    Authorization Type [employer]    Authorization Time Period 05/23/2024-07/18/2024    Authorization - Visit Number 5    Authorization - Number of Visits 13    PT Start Time 1400    PT Stop Time 1445    PT Time Calculation (min) 45 min    Equipment Utilized During Treatment Gait belt    Activity Tolerance Patient tolerated treatment well    Behavior During Therapy WFL for tasks assessed/performed           Past Medical History:  Diagnosis Date   Acute diastolic CHF (congestive heart failure) (HCC) 09/17/2020   Acute exacerbation of CHF (congestive heart failure) (HCC) 08/04/2019   Acute right PCA stroke (HCC) 04/25/2024   ANEMIA DUE TO CHRONIC BLOOD LOSS 03/13/2007   CAROTID ARTERY STENOSIS 05/10/2010   Coronary artery disease    DIABETES MELLITUS, TYPE II 09/19/2007   DISEASE, CEREBROVASCULAR NEC 03/05/2007   ESRD (end stage renal disease) on dialysis (HCC)    GERD 03/13/2007   HYPERLIPIDEMIA 03/05/2007   HYPERTENSION 03/05/2007   HYPOKALEMIA 11/09/2009   KNEE PAIN, RIGHT 11/09/2009   New onset a-fib (HCC) 04/27/2024   Eliquis    PROSTATE CANCER, HX OF 03/05/2007   Past Surgical History:  Procedure Laterality Date   A/V FISTULAGRAM N/A 03/21/2024   Procedure: A/V Fistulagram;  Surgeon: Magda Debby SAILOR, MD;  Location: [facility];  Service: Cardiovascular;  Laterality: N/A;   AV FISTULA PLACEMENT Left 10/18/2022   Procedure: INSERTION OF LEFT ARM BRACHIAL ARTERY TO AXILLARY VEIN ARTERIOVENOUS (AV) GORE-TEX GRAFT;  Surgeon: Lanis Fonda BRAVO, MD;  Location: [facility] OR;  Service: Vascular;  Laterality: Left;   CAROTID ARTERY  ANGIOPLASTY Right Oct. 10, 2001   ESOPHAGOGASTRODUODENOSCOPY (EGD) WITH PROPOFOL  N/A 11/04/2016   Procedure: ESOPHAGOGASTRODUODENOSCOPY (EGD) WITH PROPOFOL ;  Surgeon: Layla Lah, MD;  Location: [facility];  Service: Gastroenterology;  Laterality: N/A;   IR FLUORO GUIDE CV LINE RIGHT  10/25/2022   IR FLUORO GUIDE CV LINE RIGHT  10/27/2022   IR US  GUIDE VASC ACCESS RIGHT  10/25/2022   IR US  GUIDE VASC ACCESS RIGHT  10/27/2022   LAPAROSCOPIC APPENDECTOMY  02/20/2012   Procedure: APPENDECTOMY LAPAROSCOPIC;  Surgeon: Jina Nephew, MD;  Location: [facility] OR;  Service: General;  Laterality: N/A;   PROSTATE SURGERY     prostatectomy   Patient Active Problem List   Diagnosis Date Noted   New onset atrial fibrillation (HCC) 04/27/2024   Acute CVA (cerebrovascular accident) (HCC) 04/25/2024   Lumbar facet arthropathy 04/16/2024   Secondary hyperparathyroidism of renal origin 06/02/2023   Preoperative evaluation of a medical condition to rule out surgical contraindications (TAR required) 05/04/2023   Right carpal tunnel syndrome 03/16/2023   Coagulation defect, unspecified 11/07/2022   Personal history of nicotine dependence 11/01/2022   ESRD on dialysis (HCC) 10/29/2022   Protein-calorie malnutrition, severe 10/28/2022   Anemia in chronic kidney disease 10/28/2022   Hyperlipidemia, unspecified 10/28/2022   Pneumonia due to COVID-19 virus 10/25/2022   CKD (chronic kidney disease) stage 5, GFR less than 15 ml/min (HCC) 10/25/2022   Severe pulmonary  hypertension (HCC) 10/25/2022   Moderate aortic regurgitation 10/25/2022   CAD in native artery 10/25/2022   UTI (urinary tract infection) 09/27/2021   CHF exacerbation (HCC) 09/26/2021   Acute respiratory failure with hypoxia (HCC) 09/26/2021   Acute on chronic diastolic CHF (congestive heart failure) (HCC) 07/09/2021   Dyspnea 06/01/2021   Upper airway cough syndrome 04/06/2021   Porokeratosis 03/26/2021   Pulmonary nodules 10/22/2020   Acute on  chronic renal failure 10/09/2020   Mass of right lung 09/18/2020   Acute kidney injury superimposed on CKD 08/04/2019   Elevated troponin 08/04/2019   Hypokalemia 08/04/2019   Hypertensive urgency 03/28/2019   Elevated serum immunoglobulin free light chains 02/20/2019   Gastrointestinal hemorrhage with melena    Melena 11/03/2016   Carotid artery stenosis 05/10/2010   Type 2 diabetes mellitus with hyperlipidemia (HCC) 09/19/2007   Iron  deficiency anemia due to chronic blood loss 03/13/2007   GERD 03/13/2007   Essential hypertension 03/05/2007   DISEASE, CEREBROVASCULAR NEC 03/05/2007   PROSTATE CANCER, HX OF 03/05/2007    ONSET DATE: 04/25/24  REFERRING DIAG: I63.9 (ICD-10-CM) - Acute CVA (cerebrovascular accident) (HCC) R26.81 (ICD-10-CM) - Gait instability  THERAPY DIAG:  Other symptoms and signs involving the nervous system  Muscle weakness (generalized)  Unsteadiness on feet  Difficulty in walking, not elsewhere classified  Rationale for Evaluation and Treatment: Rehabilitation  SUBJECTIVE:                                                                                                                                                                                             SUBJECTIVE STATEMENT: Feeling fine, no new issues   Pt accompanied by: significant other and in waiting room  PERTINENT HISTORY: CHF, carotid artery stenosis, CAD, DMII, HLD, HTN, prostate CA, L AV fistula Dialysis: M, W, F  PAIN:  Are you having pain? No  PRECAUTIONS: Fall and Other: L AV fistula- no L UE BP   RED FLAGS: None   WEIGHT BEARING RESTRICTIONS: No  FALLS: Has patient fallen in last 6 months? No  LIVING ENVIRONMENT: Lives with: lives with their spouse Lives in: House/apartment Stairs: 3-4 steps to enter with railing; 2 story home Has following equipment at home: Vannie - 2 wheeled, Environmental Consultant - 4 wheeled, Wheelchair (manual), and quad tip cane  PLOF: Independent with basic  ADLs and wife was assisting with shower transfers, cooking, cleaning   PATIENT GOALS: maybe a little balance  OBJECTIVE:    TODAY'S TREATMENT: 06/25/24 Activity Comments  NU-step  L5 x 6 min  Cues to maintain 50+ SPM  Standing DF/PF 2x10 BUE support  -Forward kerr-mcgee  step x 2 min -sidestepping x 2 min -lateral step-up/down x 2 min, 6 step 3#, // bar support  LAQ 3x10 3#           TODAY'S TREATMENT: 06/20/24 Activity Comments  Nustep L4 x 6 min UEs/LEs Dynamic warm up ; cueing to maintain at least 60SPM  Review of HEP: - Heel Toe Raises with Counter Support  - 1 x daily - 4 x weekly - 2 sets - 10 reps - March in Place  - 1 x daily - 4 x weekly - 2 sets - 10 reps - Side Stepping with Counter Support  - 1 x daily - 4 x weekly - 1 sets - 3 reps Required cueing for larger amplitude movement and to maintain amplitude   gait training with quad tip cane and 4WW with cueing for pacing 335ft Cueing for safe use of walker: keeping elbows bent, feet within wheels, improved foot clearance   gait training with quad tip cane, ambulating around and over cones/bean bags Requires cueing for direction changes, navigation, staying on task  Standing hip ABD 2x10 Cues to stand up tall, proper line of kick. Improved form with visual targets          HOME EXERCISE PROGRAM: Access Code: [email or web address] URL: [email or web address] Date: 06/13/2024 Prepared by: [facility] - Outpatient  Rehab - [facility]  Exercises - Heel Toe Raises with Counter Support  - 1 x daily - 4 x weekly - 2 sets - 10 reps - March in Place  - 1 x daily - 4 x weekly - 2 sets - 10 reps - Side Stepping with Counter Support  - 1 x daily - 4 x weekly - 1 sets - 3 reps  -------------------------------------------------------- Note: Objective measures were completed at Evaluation unless otherwise noted.  DIAGNOSTIC FINDINGS: 04/25/24 brain MRI: Acute infarct in the right PCA territory as above. 2. Remote infarct  in the right basal ganglia with evidence of prior hemorrhage. 3. Additional remote lacunar infarcts in the left thalamus. 4. Focus of signal abnormality and susceptibility within the left parietal lobe, possibly representing remote hemorrhage or vascular malformation such as cavernous malformation. Additional similar focus of signal abnormality and susceptibility along the right dorsal aspect of the pons.  COGNITION: Overall cognitive status: difficult to discern; requires increased instruction for tasks occasionally    SENSATION: Pt reports N/T in UEs  COORDINATION: Alternating pronation/supination: slight dysmetria B Alternating toe tap: hypokinesia L>R Finger to nose: WNL B  MUSCLE TONE: WNL B LE  POSTURE: rounded shoulders, forward head, and shoulders elevations  LOWER EXTREMITY ROM:     Active  Right Eval Left Eval  Hip flexion    Hip extension    Hip abduction    Hip adduction    Hip internal rotation    Hip external rotation    Knee flexion    Knee extension    Ankle dorsiflexion 8 16  Ankle plantarflexion    Ankle inversion    Ankle eversion     (Blank rows = not tested)  LOWER EXTREMITY MMT:    MMT (in sitting) Right Eval Left Eval  Hip flexion 4+ 4+  Hip extension    Hip abduction 4- 4-  Hip adduction 4- 4-  Hip internal rotation    Hip external rotation    Knee flexion 3+ 3+  Knee extension 4+ 4  Ankle dorsiflexion 4 4  Ankle plantarflexion 4 4  Ankle inversion    Ankle eversion    (  Blank rows = not tested)  GAIT: Findings: Assistive device utilized:quad tip cane, Level of assistance: SBA and CGA, and Comments: limited foot clearance B and tendency to shuffle feet; slowed *pt requires increased direction to navigate clinic environment; suspect vision vs. Cognition as a factor   FUNCTIONAL TESTS:  5 times sit to stand: 23.8 sec pushing off LEs without standing fully  10 meter walk test: 17.23 sec with cane (1.9 ft/sec)                                                                                                                                 TREATMENT DATE: 05/23/24    PATIENT EDUCATION: Education details: edu on benefits of OT for vision, prognosis, POC, exam findings as they relate for functional impairments  Person educated: Patient and Spouse Education method: Explanation Education comprehension: verbalized understanding  HOME EXERCISE PROGRAM: Not yet initiated    GOALS: Goals reviewed with patient? Yes  SHORT TERM GOALS: Target date: 06/13/2024  Patient to be independent with initial HEP. Baseline: HEP initiated;pt requires cueing Goal status: partially MET    LONG TERM GOALS: Target date: 07/18/2024  Patient to be independent with advanced HEP. Baseline: Not yet initiated  Goal status: IN PROGRESS  to be improve to at least 400 ft for improved gait efficiency and endurance. Baseline: 365 ft Goal status: IN PROGRESS  Patient to score at least 15/24 on DGI in order to decrease risk of falls.  Baseline: 7 Goal status: IN PROGRESS  Patient to demonstrate 5xSTS test in <15 sec in order to decrease risk of falls.  Baseline: 23 sec Goal status: IN PROGRESS  Patient to demonstrate gait speed of at least 2.3 ft/sec in order to improve access to community.  Baseline: 1.9 ft/sec Goal status: IN PROGRESS   ASSESSMENT:  CLINICAL IMPRESSION: Therapeutic exercise to improve activity tolerance and endurance w/ emphasis on performing time-based activities for sustained movement with 2:1 work:rest ratio employed to good effect w/out adverse effect and pt reporting mild-moderate fatigue by end of session.  Continued sessions to progress training for improved mobility, strength, and reduced risk for falls  OBJECTIVE IMPAIRMENTS: Abnormal gait, decreased activity tolerance, decreased balance, decreased coordination, decreased endurance, difficulty walking, decreased ROM, decreased strength, decreased  safety awareness, impaired flexibility, and postural dysfunction.   ACTIVITY LIMITATIONS: carrying, lifting, bending, sitting, standing, squatting, stairs, transfers, bed mobility, bathing, toileting, dressing, reach over head, hygiene/grooming, and locomotion level  PARTICIPATION LIMITATIONS: meal prep, cleaning, shopping, community activity, and church  PERSONAL FACTORS: Age, Fitness, Past/current experiences, Time since onset of injury/illness/exacerbation, and 3+ comorbidities: CHF, carotid artery stenosis, CAD, DMII, HLD, HTN, prostate CA, L AV fistula are also affecting patient's functional outcome.   REHAB POTENTIAL: Good  CLINICAL DECISION MAKING: Evolving/moderate complexity  EVALUATION COMPLEXITY: Moderate  PLAN:  PT FREQUENCY: 1-2x/week  PT DURATION: 6 weeks  PLANNED INTERVENTIONS: [id number]- PT Re-evaluation, 97750- Physical Performance Testing, 97110-Therapeutic exercises, 97530-  Therapeutic activity, [id number]- Neuromuscular re-education, [id number]- Self Care, [id number]- Manual therapy, [id number]- Gait training, [phone]- Canalith repositioning, Patient/Family education, Balance training, Stair training, Taping, Vestibular training, DME instructions, Cryotherapy, and Moist heat  PLAN FOR NEXT SESSION: Review HEP for BLE strength and balance and update HEP as appropriate. Faster walking w/ rollator (uses one on dialysis days)    2:50 PM, 06/25/24 M. Kelly Arria Naim, PT, DPT Physical Therapist- [place] Office Number: [phone]

## 2024-06-25 NOTE — Therapy (Signed)
 OUTPATIENT OCCUPATIONAL THERAPY NEURO  Treatment Note  Patient Name: Austin [name] MRN: [id number] DOB:24-Nov-1934, 89 y.o., male Today's Date: 06/25/2024  PCP: Merna Huxley, NP REFERRING PROVIDER: Merna Huxley, NP  END OF SESSION:  OT End of Session - 06/25/24 1454     Visit Number 4    Number of Visits 10    Date for Recertification  07/26/24    Authorization Type [employer] 2025    Authorization Time Period Auth#: [id number] , approved 10 OT visits from 06/13/2024 - 07/26/2024    Authorization - Number of Visits 10    OT Start Time 1450    OT Stop Time 1530    OT Time Calculation (min) 40 min            Past Medical History:  Diagnosis Date   Acute diastolic CHF (congestive heart failure) (HCC) 09/17/2020   Acute exacerbation of CHF (congestive heart failure) (HCC) 08/04/2019   Acute right PCA stroke (HCC) 04/25/2024   ANEMIA DUE TO CHRONIC BLOOD LOSS 03/13/2007   CAROTID ARTERY STENOSIS 05/10/2010   Coronary artery disease    DIABETES MELLITUS, TYPE II 09/19/2007   DISEASE, CEREBROVASCULAR NEC 03/05/2007   ESRD (end stage renal disease) on dialysis (HCC)    GERD 03/13/2007   HYPERLIPIDEMIA 03/05/2007   HYPERTENSION 03/05/2007   HYPOKALEMIA 11/09/2009   KNEE PAIN, RIGHT 11/09/2009   New onset a-fib (HCC) 04/27/2024   Eliquis    PROSTATE CANCER, HX OF 03/05/2007   Past Surgical History:  Procedure Laterality Date   A/V FISTULAGRAM N/A 03/21/2024   Procedure: A/V Fistulagram;  Surgeon: Magda Debby SAILOR, MD;  Location: [facility];  Service: Cardiovascular;  Laterality: N/A;   AV FISTULA PLACEMENT Left 10/18/2022   Procedure: INSERTION OF LEFT ARM BRACHIAL ARTERY TO AXILLARY VEIN ARTERIOVENOUS (AV) GORE-TEX GRAFT;  Surgeon: Lanis Fonda BRAVO, MD;  Location: [facility] OR;  Service: Vascular;  Laterality: Left;   CAROTID ARTERY ANGIOPLASTY Right Oct. 10, 2001   ESOPHAGOGASTRODUODENOSCOPY (EGD) WITH PROPOFOL  N/A 11/04/2016   Procedure: ESOPHAGOGASTRODUODENOSCOPY (EGD)  WITH PROPOFOL ;  Surgeon: Layla Lah, MD;  Location: [facility];  Service: Gastroenterology;  Laterality: N/A;   IR FLUORO GUIDE CV LINE RIGHT  10/25/2022   IR FLUORO GUIDE CV LINE RIGHT  10/27/2022   IR US  GUIDE VASC ACCESS RIGHT  10/25/2022   IR US  GUIDE VASC ACCESS RIGHT  10/27/2022   LAPAROSCOPIC APPENDECTOMY  02/20/2012   Procedure: APPENDECTOMY LAPAROSCOPIC;  Surgeon: Jina Nephew, MD;  Location: [facility] OR;  Service: General;  Laterality: N/A;   PROSTATE SURGERY     prostatectomy   Patient Active Problem List   Diagnosis Date Noted   New onset atrial fibrillation (HCC) 04/27/2024   Acute CVA (cerebrovascular accident) (HCC) 04/25/2024   Lumbar facet arthropathy 04/16/2024   Secondary hyperparathyroidism of renal origin 06/02/2023   Preoperative evaluation of a medical condition to rule out surgical contraindications (TAR required) 05/04/2023   Right carpal tunnel syndrome 03/16/2023   Coagulation defect, unspecified 11/07/2022   Personal history of nicotine dependence 11/01/2022   ESRD on dialysis (HCC) 10/29/2022   Protein-calorie malnutrition, severe 10/28/2022   Anemia in chronic kidney disease 10/28/2022   Hyperlipidemia, unspecified 10/28/2022   Pneumonia due to COVID-19 virus 10/25/2022   CKD (chronic kidney disease) stage 5, GFR less than 15 ml/min (HCC) 10/25/2022   Severe pulmonary hypertension (HCC) 10/25/2022   Moderate aortic regurgitation 10/25/2022   CAD in native artery 10/25/2022   UTI (urinary tract infection) 09/27/2021   CHF  exacerbation (HCC) 09/26/2021   Acute respiratory failure with hypoxia (HCC) 09/26/2021   Acute on chronic diastolic CHF (congestive heart failure) (HCC) 07/09/2021   Dyspnea 06/01/2021   Upper airway cough syndrome 04/06/2021   Porokeratosis 03/26/2021   Pulmonary nodules 10/22/2020   Acute on chronic renal failure 10/09/2020   Mass of right lung 09/18/2020   Acute kidney injury superimposed on CKD 08/04/2019   Elevated troponin  08/04/2019   Hypokalemia 08/04/2019   Hypertensive urgency 03/28/2019   Elevated serum immunoglobulin free light chains 02/20/2019   Gastrointestinal hemorrhage with melena    Melena 11/03/2016   Carotid artery stenosis 05/10/2010   Type 2 diabetes mellitus with hyperlipidemia (HCC) 09/19/2007   Iron  deficiency anemia due to chronic blood loss 03/13/2007   GERD 03/13/2007   Essential hypertension 03/05/2007   DISEASE, CEREBROVASCULAR NEC 03/05/2007   PROSTATE CANCER, HX OF 03/05/2007    ONSET DATE: referral date 05/24/24  REFERRING DIAG: H53.9 (ICD-10-CM) - Visual disturbance  THERAPY DIAG:  Other symptoms and signs involving the nervous system  Visuospatial deficit  Rationale for Evaluation and Treatment: Rehabilitation  SUBJECTIVE:   SUBJECTIVE STATEMENT: Pt reports working on some of his worksheets.   Pt accompanied by: self  PERTINENT HISTORY: Admitted to [facility] on 9/4 with visual changes over the last 4 days. MRI reveals acute R PCA infarct and remote R basal ganglia infarct with evidence of prior hemorrhage.   H/o: CHF, carotid artery stenosis, CAD, DMII, HLD, HTN, prostate CA, L AV fistula Dialysis: M, W, F  PRECAUTIONS: Fall and Other: L AV fistula- no L UE BP   WEIGHT BEARING RESTRICTIONS: No  PAIN:  Are you having pain? No  FALLS: Has patient fallen in last 6 months? No  LIVING ENVIRONMENT: Lives with: lives with their spouse Lives in: House/apartment Stairs: 3-4 steps to enter with railing; 2 story home with bedroom/full bath on the second floor Has following equipment at home: Vannie - 2 wheeled, Environmental Consultant - 4 wheeled, Wheelchair (manual), and quad tip cane  PLOF: Independent with basic ADLs and wife was assisting with shower transfers, cooking, cleaning   PATIENT GOALS: strategies to aid in vision; more energy to do things  OBJECTIVE:  Note: Objective measures were completed at Evaluation unless otherwise noted.  HAND DOMINANCE:  Right  ADLs: Transfers/ambulation related to ADLs: using quad tip cane and cues for L attention Eating: Mod I Grooming: some difficulty with shaving, but I manage UB Dressing: Mod I LB Dressing: Mod I Toileting: Mod I Bathing: occasional assist with washing back Tub Shower transfers: very difficult to get out of garden tub  IADLs: Shopping: wife completes Light housekeeping: wife completes Meal Prep: would make breakfast prior to stroke, but has not since Community mobility: not driving Medication management: spouse administers meds, has used a pill box in the past Financial management: wife completes  MOBILITY STATUS: utilizing quad tip cane   POSTURE COMMENTS:  rounded shoulders, forward head, and shoulders elevations   ACTIVITY TOLERANCE: Activity tolerance: reports decreased activity tolerance  UPPER EXTREMITY ROM:  WFL bilaterally   UPPER EXTREMITY MMT:   4+5 overall   COORDINATION: 9 Hole Peg test: Right: 47.44 sec; Left: 58.57 sec - pt picking up additional pegs to place due to decreased visual attention and looking for additional holes  SENSATION: WFL  COGNITION: Overall cognitive status: spouse reports that he gets confused more since the stroke than before  VISION: Subjective report: decreased attention to L Baseline vision: Bifocals and Wears glasses  all the time Visual history: cataracts, had those removed  VISION ASSESSMENT: Gaze preference/alignment: WDL Tracking/Visual pursuits: Decreased smoothness with horizontal tracking Saccades: undershoots, decreased speed of saccadic movements, and decreased scanning to L visual field Visual Fields: Left visual field deficits Confrontation testing: pt unable to identify stimulus in Left visual field with or without stimulus on Right Line bisection: pt correctly bisecting 4/6 lines, verbally stating where is the end of the line and turning head to scan full paper in 2 instances.  Patient has  difficulty with following activities due to following visual impairments: reading from medical list as pt frequently omitting words on far left  PERCEPTION: Impaired: Inattention/neglect: does not attend to left visual field  OBSERVATIONS: Pt requiring verbal cues for visual attention and scanning to Left while navigating through treatment space as pt initially not able to see treatment room on Left side of gym.                                                                                                                             TREATMENT DATE:  06/25/24 Jigsaw puzzle: OT setting up puzzle on red place mat to provide visual board to allow pt to locate items on far Left.  OT also encouraging pt to place L hand on table to scan to hand as additional visual anchor.  Pt demonstrating max difficulty with scanning to L and nodding off when OT providing with increased time for sequencing, therefore terminated task to engage in more engaging task.   Matching cards: utilized educational psychologist cards with matching pair in left visual field.  Pt able to complete with increased time and verbal cues to maintain awake and engaged in task.  OT transitioned to deck of cards, challenging pt to sort them in numerical order - therefore needing to scan to L periodically to locate matching cards in L visual field. Visual scanning: engaged in scanning letters, naming underlined letters aloud.  Pt completing 5 lines with use of bookmark for lines 4 and 5 to increase visual attention to task.  Pt omitting 3/14 letters when scanning.   06/20/24 Visual attention: engaged in visual scanning and attention to complete peg board pattern replication.  Pattern and peg board placed at midline and pegs placed on L to facilitate visual scanning to L side.  Pt completing R side of pattern, however requiring increased time and verbal cues to scan to far left corner of pegboard when placing pegs left of midline. OT providing cues and visual  target on L to increase attention to L, encouraging pt to utilize L hand as marker to visually scan towards.  Reviewed worksheets: pt omitting one number when scanning from 1-20, number 12 was in far left visual field.  Pt omitting 9 of 21 88  when scanning lines, 8/9 at midline and left of midline.  Completed 88 scanning worksheet again with pt able to locate 8 of 9 initially omitted numbers.   Visual  scanning: locating additional 3 items in I Spy activity.  Pt requiring increased time to locate, however with use of visual anchor on L, pt with improved ability to locate 6/6 volcanoes, 4/4 leaves, and 2/2 waves.     06/18/24 Bell cancellation: pt able to locate 32/35 bells with significantly increased time (7:30).  Starting at right side of paper and slowly scanning left.  Pt omitting 3 bells at far left portion of picture. Linear scanning: engaged in scanning lines one at a time to locate all 88, pt omitting 2 of 7, however benefiting from use of bookmark to occlude remaining lines to allow focus on one at a time.  OT applied colored anchor to left side of paper to facilitate visual attention and scanning to far left side of paper. Visual attention: engaged in I Spy activity to locate various shapes.  Pt requiring increased time, locating 3/4 suns, 5/6 ladybugs, and 2/2 butterflies.  OT applied colored anchor to left side of paper to facilitate visual attention and scanning to far left side of paper.  PATIENT EDUCATION: Education details: visual scanning and Left anchor during table top tasks Person educated: Patient and Spouse Education method: Explanation, Verbal cues, and Handouts Education comprehension: verbalized understanding and needs further education  HOME EXERCISE PROGRAM: Visual spatial awareness - see pt instructions   GOALS: Goals reviewed with patient? Yes  SHORT TERM GOALS: Target date: 07/05/24  Pt and spouse with verbalize and demonstrate understanding of visual  compensatory strategies. Baseline: Goal status: in progress  2.  Pt will be independent in visual exercises to increase awareness and vision in L visual field.  Baseline:  Goal status: in progress  3.  Pt will complete table top scanning activity with min cues for technique. Baseline:  Goal status: in progress  4.  Pt will verbalize understanding of safe tub/shower transfers. Baseline:  Goal status: in progress   LONG TERM GOALS: Target date: 07/26/24  Pt will navigate a moderately busy environment, following multi-step commands with 90% accuracy Baseline:  Goal status: in progress  2.  Pt will complete table top scanning activity with Supervision in standing for increased visual attention and standing tolerance. Baseline:  Goal status: in progress  3.  Pt will demonstrate improved fine motor coordination for ADLs and visual attention to Left visual field as evidenced by decreasing 9 hole peg test score for BUE by 5 secs on RUE and 10 seconds on LUE.  Baseline: Right: 47.44 sec; Left: 58.57 sec Goal status: in progress  4.  Pt will verbalize and/or demonstrate understanding of energy conservation strategies to increase engagement in and safety with ADLs and IADLs.  Baseline:  Goal status: in progress    ASSESSMENT:  CLINICAL IMPRESSION: Patient is a 89 y.o. male who was seen today for occupational therapy treatment for visual deficits s/p CVA. Pt demonstrating impairments of L visual field and inattention.  OT positioning self to pt's left at table top and when ambulating to increase visual scanning to left and provide buffer during ambulation.  Pt again very drowsy and nodding off during more quiet tasks requiring visual attention and time to process.  Pt continues to benefit from verbal cues and anchor to scan to L during table top tasks, still benefiting from therapist involvement from a vision standpoint but mostly an arousal standpoint this session.  Pt will continue  to benefit from skilled occupational therapy services to address balance, GM/FM control, activity tolerance, safety awareness, introduction of compensatory strategies/AE prn,  visual-perception, and implementation of an HEP to improve participation and safety during ADLs and IADLs.    PERFORMANCE DEFICITS: in functional skills including ADLs, IADLs, Fine motor control, balance, body mechanics, endurance, decreased knowledge of precautions, decreased knowledge of use of DME, and vision, cognitive skills including memory and safety awareness, and psychosocial skills including coping strategies, environmental adaptation, and routines and behaviors.     PLAN:  OT FREQUENCY: 2x/week for 3 weeks, then 1x/week for additional 3 weeks  OT DURATION: 6 weeks  PLANNED INTERVENTIONS: 97168 OT Re-evaluation, 97535 self care/ADL training, 02889 therapeutic exercise, 97530 therapeutic activity, 97112 neuromuscular re-education, functional mobility training, visual/perceptual remediation/compensation, psychosocial skills training, energy conservation, coping strategies training, patient/family education, and DME and/or AE instructions  RECOMMENDED OTHER SERVICES: NA  CONSULTED AND AGREED WITH PLAN OF CARE: Patient and family member/caregiver  PLAN FOR NEXT SESSION: complete Trail making, complete peg board pattern replication with small pegs, educate on visual scanning during table top and with mobility.  Incorporate mobility with scavenger hunt type tasks    KAYLENE DOMINO, OTR/L 06/25/2024, 2:55 PM   [facility] at [facility] [address] [place], [address] [place], [place] [address] Phone # [phone] Fax # [phone]

## 2024-06-25 NOTE — Progress Notes (Signed)
 @Patient  ID: Austin [name], male    DOB: 02/07/1935, 89 y.o.   MRN: [id number]  Chief Complaint  Patient presents with   Medical Management of Chronic Issues    CT and CXR results     Referring provider: Merna Huxley, NP  HPI: 89 year old male, former smoker quit 1976 (10-pack-year history).  Past medical history significant for mass of right lung, HTN, congestive heart failure, coronary artery stenosis, afib, CVA, GERD, diabetes, ESRD on dialysis, history of prostate cancer.  Patient of Dr. Shelah, last seen in office on 05/27/21 pulmonary nodule right upper lobe.   Previous [facility] pulmonary encounter:  ROV 05/16/2024 --follow-up visit for 89 year old male with history of upper airway irritation syndrome and chronic cough in the setting of GERD and chronic rhinitis, chronic renal failure on HD with hypertension and diastolic CHF.  I have also followed him for pulmonary nodular disease with nodules that have been stable on serial imaging including his last in April 2024.  I had planned to repeat his CT in August 2025 but this has not been done yet. He does a lot of throat clearing. He has a lot of drainage especially when eating. No real reflux - uses Nexium  and has good results. He is on zyrtec  and flonase . He is fairly sedentary. He does HD MWF, tolerates, does get some cramping. He had a mild CVA at the beginning of this month.    06/25/2024- Interim hx  Discussed the use of AI scribe software for clinical note transcription with the patient, who gave verbal consent to proceed.  History of Present Illness Austin [name] is an 89 year old male with pulmonary nodules who presents for a follow-up after a CT scan.  He has a history of pulmonary nodules, with a recent CT scan on October 14th showing a slight increase in the size of a spiculated lung nodule in the right upper lobe, now measuring 1.6 x 1.5 x 2.2 cm. Previously 1.5 x 1.7cm.   No symptoms of unintentional weight loss, hemoptysis, persistent  cough, or fever. He occasionally clears his throat but denies coughing up mucus or experiencing night sweats.  He does not use supplemental oxygen  regularly, although he has it available and uses it occasionally for comfort.  Family history is significant for his father having died from mesothelioma, and he is concerned about his own risk due to having lived with his father for several years.   Allergies  Allergen Reactions   Aspirin  Other (See Comments)    High doses causes stomach ulcer and bleeding    Immunization History  Administered Date(s) Administered   Fluad Quad(high Dose 65+) 05/01/2019, 05/27/2021, 07/18/2022   INFLUENZA, HIGH DOSE SEASONAL PF 07/08/2013, 07/14/2015, 06/20/2016, 05/24/2017, 06/12/2018, 04/27/2024   Influenza Split 05/27/2011, 05/30/2012   Influenza Whole 07/10/2009, 05/10/2010   Influenza,inj,Quad PF,6+ Mos 06/25/2014   Influenza,trivalent, recombinat, inj, PF 06/26/2023   Influenza-Unspecified 10/06/2020   PFIZER Comirnaty(Gray Top)Covid-19 Tri-Sucrose Vaccine 03/16/2021   PFIZER(Purple Top)SARS-COV-2 Vaccination 09/11/2019, 10/02/2019, 06/15/2020   PNEUMOCOCCAL CONJUGATE-20 11/28/2022   Pfizer Covid-19 Vaccine Bivalent Booster 42yrs & up 06/03/2021   Pneumococcal Conjugate-13 11/25/2014   Pneumococcal Polysaccharide-23 03/07/2013   Tetanus 03/07/2013   Zoster Recombinant(Shingrix) 04/03/2020, 07/27/2020    Past Medical History:  Diagnosis Date   Acute diastolic CHF (congestive heart failure) (HCC) 09/17/2020   Acute exacerbation of CHF (congestive heart failure) (HCC) 08/04/2019   Acute right PCA stroke (HCC) 04/25/2024   ANEMIA DUE TO CHRONIC BLOOD LOSS 03/13/2007   CAROTID  ARTERY STENOSIS 05/10/2010   Coronary artery disease    DIABETES MELLITUS, TYPE II 09/19/2007   DISEASE, CEREBROVASCULAR NEC 03/05/2007   ESRD (end stage renal disease) on dialysis (HCC)    GERD 03/13/2007   HYPERLIPIDEMIA 03/05/2007   HYPERTENSION 03/05/2007    HYPOKALEMIA 11/09/2009   KNEE PAIN, RIGHT 11/09/2009   New onset a-fib (HCC) 04/27/2024   Eliquis    PROSTATE CANCER, HX OF 03/05/2007    Tobacco History: Social History   Tobacco Use  Smoking Status Former   Current packs/day: 0.00   Average packs/day: 1 pack/day for 10.0 years (10.0 ttl pk-yrs)   Types: Cigarettes   Start date: 08/22/1964   Quit date: 08/22/1974   Years since quitting: 49.8  Smokeless Tobacco Never   Counseling given: Not Answered   Outpatient Medications Prior to Visit  Medication Sig Dispense Refill   Accu-Chek Softclix Lancets lancets USED TO CHECK BLOOD GLUCOSE TWICE A DAY OR AS NEEDED 100 each 6   acetaminophen  (TYLENOL ) 500 MG tablet Take 1,000 mg by mouth every 4 (four) hours as needed.     albuterol  (VENTOLIN  HFA) 108 (90 Base) MCG/ACT inhaler Inhale 2 puffs into the lungs every 6 (six) hours as needed for wheezing or shortness of breath. 8.5 each 2   amLODipine  (NORVASC ) 10 MG tablet TAKE 1 TABLET BY MOUTH EVERY DAY 90 tablet 3   amoxicillin  (AMOXIL ) 875 MG tablet Take 875 mg by mouth 2 (two) times daily.     apixaban  (ELIQUIS ) 2.5 MG TABS tablet Take 1 tablet (2.5 mg total) by mouth 2 (two) times daily. 60 tablet 11   atorvastatin  (LIPITOR) 20 MG tablet TAKE 1 TABLET BY MOUTH EVERY DAY 90 tablet 3   cetirizine  (ZYRTEC ) 5 MG tablet Take 1 tablet (5 mg total) by mouth at bedtime. 60 tablet 0   cholecalciferol  (VITAMIN D3) 25 MCG (1000 UNIT) tablet Take 1,000 Units by mouth daily.     cloNIDine  (CATAPRES ) 0.2 MG tablet Take 1 tablet (0.2 mg total) by mouth daily. 30 tablet 6   doxazosin  (CARDURA ) 2 MG tablet Take 2 mg by mouth at bedtime.   3   esomeprazole  (NEXIUM ) 40 MG capsule TAKE 1 CAPSULE BY MOUTH EVERY DAY 90 capsule 3   fluticasone  (FLONASE ) 50 MCG/ACT nasal spray Place 1 spray into both nostrils daily. 9.9 mL 2   glucose blood (ACCU-CHEK GUIDE) test strip USE TO CHECK BLOOD GLUCOSE TWICE A DAY OR AS NEEDED 300 strip 1   guaiFENesin  (MUCINEX ) 600 MG  12 hr tablet Take 600 mg by mouth at bedtime.     isosorbide -hydrALAZINE  (BIDIL ) 20-37.5 MG tablet Take 1 tablet by mouth 3 (three) times daily. 30 tablet 0   ketoconazole  (NIZORAL ) 2 % cream Apply 1 Application topically 2 (two) times daily. 60 g 2   lactulose  (CHRONULAC ) 10 GM/15ML solution TAKE 30 ML BY MOUTH TWICE DAILY AS NEEDED 237 mL 3   Melatonin 10 MG TABS Take 10 mg by mouth at bedtime.     Methoxy PEG-Epoetin  Beta (MIRCERA IJ) Mircera     methylcellulose (CITRUCEL) oral powder 1 tablespoon every 2-3 days,alternating with lactulose      Multiple Vitamins-Minerals (PRESERVISION AREDS 2) CAPS Take 1 capsule by mouth daily.     Propylene Glycol (SYSTANE BALANCE OP) Place 1 drop into both eyes at bedtime.     sevelamer  carbonate (RENVELA ) 800 MG tablet Take 800 mg by mouth 3 (three) times daily.     No facility-administered medications prior to visit.  Review of Systems  Review of Systems  Constitutional:  Negative for fatigue and fever.  Respiratory:  Positive for cough. Negative for shortness of breath and wheezing.    Physical Exam  BP 134/66   Pulse 83   Temp 97.7 F (36.5 C)   Ht 5' 7 (1.702 m)   Wt 175 lb 6.4 oz (79.6 kg)   SpO2 97% Comment: ra  BMI 27.47 kg/m  Physical Exam Constitutional:      Appearance: Normal appearance. He is well-developed.  HENT:     Head: Normocephalic and atraumatic.     Mouth/Throat:     Mouth: Mucous membranes are moist.     Pharynx: Oropharynx is clear.  Cardiovascular:     Rate and Rhythm: Normal rate and regular rhythm.     Heart sounds: Normal heart sounds.  Pulmonary:     Effort: Pulmonary effort is normal. No respiratory distress.     Breath sounds: Normal breath sounds. No wheezing or rhonchi.  Musculoskeletal:        General: Normal range of motion.     Cervical back: Normal range of motion and neck supple.  Skin:    General: Skin is warm and dry.     Findings: No erythema or rash.  Neurological:     General: No  focal deficit present.     Mental Status: He is alert and oriented to person, place, and time. Mental status is at baseline.  Psychiatric:        Mood and Affect: Mood normal.        Behavior: Behavior normal.        Thought Content: Thought content normal.        Judgment: Judgment normal.     Lab Results:  CBC    Component Value Date/Time   WBC 7.5 05/26/2024 2138   RBC 3.45 (L) 05/26/2024 2138   HGB 9.7 (L) 05/26/2024 2138   HGB 9.7 (L) 08/19/2019 1107   HCT 31.6 (L) 05/26/2024 2138   PLT 333 05/26/2024 2138   PLT 262 08/19/2019 1107   MCV 91.6 05/26/2024 2138   MCH 28.1 05/26/2024 2138   MCHC 30.7 05/26/2024 2138   RDW 17.3 (H) 05/26/2024 2138   LYMPHSABS 1.4 04/25/2024 2035   MONOABS 0.8 04/25/2024 2035   EOSABS 0.2 04/25/2024 2035   BASOSABS 0.1 04/25/2024 2035    BMET    Component Value Date/Time   NA 137 05/26/2024 2138   NA 138 12/02/2019 0000   K 4.1 05/26/2024 2138   CL 98 05/26/2024 2138   CO2 21 (L) 05/26/2024 2138   GLUCOSE 157 (H) 05/26/2024 2138   GLUCOSE 112 (H) 06/19/2006 0948   BUN 50 (H) 05/26/2024 2138   BUN 29 (A) 12/02/2019 0000   CREATININE 9.78 (H) 05/26/2024 2138   CREATININE 3.59 (H) 03/03/2020 1454   CALCIUM  9.1 05/26/2024 2138   GFRNONAA 5 (L) 05/26/2024 2138   GFRNONAA 20 (L) 08/19/2019 1107   GFRAA 19 (L) 02/26/2020 0724   GFRAA 23 (L) 08/19/2019 1107    BNP    Component Value Date/Time   BNP 3,258.2 (H) 05/26/2024 2139    ProBNP    Component Value Date/Time   PROBNP 1,890.0 (H) 06/11/2021 1057    Imaging: CT CHEST WO CONTRAST Result Date: 06/07/2024 EXAM: CT CHEST WITHOUT CONTRAST 06/04/2024 10:35:00 AM TECHNIQUE: CT of the chest was performed without the administration of intravenous contrast. Multiplanar reformatted images are provided for review. Automated exposure control, iterative reconstruction,  and/or weight based adjustment of the mA/kV was utilized to reduce the radiation dose to as low as reasonably  achievable. COMPARISON: Chest CT without contrast 01/28/2023, chest CT without contrast 11/25/2021. CLINICAL HISTORY: FINDINGS: MEDIASTINUM: The heart is moderately enlarged. There is increased pericardial effusion maximum thickness 1.6 cm. There are moderate calcifications in the great vessels. The coronary arteries and aorta are heavily calcified. There is aortic tortuosity. The mid ascending aorta measures 4.0 cm. There are calcifications and thickening of the aortic valve leaflets without further aortic dilatation. There is calcification in the mitral annulus. There is a 3.2 cm pulmonary trunk indicating arterial hypertension. The central pulmonary veins are upper normal in caliber. The esophagus, thoracic trachea, and main bronchi are unremarkable. The lower poles of the thyroid  are unremarkable. LYMPH NODES: Borderline prominent pre-carinal, subcarinal, and AP window lymph nodes are unchanged. No enlarged thoracic or axillary lymph nodes are seen. The hila are not well evaluated without contrast, but no new contour-deforming abnormality is suspected. LUNGS AND PLEURA: Spiculated solid nodule in the posterior segment of the right upper lobe is slightly larger than previously. Today this is 1.6 x 1.5 x 2.2 cm on series 5 axial images 54 and 55 and coronal reconstruction series 3 image 116. Previously this was 1.5 x 1 x 1.7 cm. There is a stable 5 mm pleural-based right lower lobe nodule posteriorly on axial image 80. Laterally in the left upper lobe is a subsolid nodule which has developed increased solid appearance and has grown, today is 1.6 x 1.2 cm on axial 66, was previously 1.3 x 0.8 cm. In the left lower lobe there is a stable 5 mm ground glass nodule on axial image 87. No other nodules are identified. There are paraseptal and centrilobular emphysematous changes, mild, predominating in the apices. There is no pleural effusion or pneumothorax or consolidation. SOFT TISSUES/BONES: There is osteopenia with  multilevel thoracic spine bridging enthesopathy and mild thoracic kyphosis. No acute or other significant osseous findings. Mild dendritic gynecomastia. UPPER ABDOMEN: There is adenomatous hyperplasia of the adrenal glands. No acute upper abdominal findings. Aortic and visceral branch vessel atherosclerosis. IMPRESSION: 1. Slight interval growth of the spiculated solid nodule in the posterior segment of the right upper lobe, now measuring 1.6 x 1.5 x 2.2 cm. PET CT or tissue sampling recommended. 2. Interval growth and increased solid component of the subsolid nodule in the left upper lobe, now measuring 1.6 x 1.2 cm. Consider adenocarcinoma. PET CT or tissue sampling recommended. 3. Stable 5 mm nodule in the left lower lobe. 4. Moderately enlarged heart with increased pericardial effusion, maximum thickness 1.6 cm. 5. Aortic tortuosity with mid ascending aorta measuring 4.0 cm. Annual CTA or MRA imaging follow-up recommended. Electronically signed by: Francis Quam MD 06/07/2024 03:04 AM EDT RP Workstation: [id number]   DG Chest Portable 1 View Result Date: 05/26/2024 CLINICAL DATA:  Shortness of breath. EXAM: PORTABLE CHEST 1 VIEW COMPARISON:  Radiograph 03/22/2024, CT 03/28/2023 FINDINGS: Cardiomegaly is unchanged. Mediastinal contours are stable, aortic atherosclerosis. Slight vascular congestion without pulmonary edema. Streaky right suprahilar opacity, pulmonary nodule on prior imaging. No pleural effusion or pneumothorax. No acute airspace disease. No acute osseous findings. IMPRESSION: 1. Cardiomegaly with slight vascular congestion. 2. Streaky right suprahilar opacity, pulmonary nodule on prior imaging. Electronically Signed   By: Andrea Gasman M.D.   On: 05/26/2024 22:05     Assessment & Plan:   1. Pulmonary nodules (Primary) - NM PET Image Initial (PI) Skull Base To Thigh; Future  Assessment and Plan Assessment & Plan Suspicious right upper lobe lung nodule with interval growth CT  chest in October 2025 showed slight increase in size of a spiculated lung nodule in the right upper lobe, now measuring 1.6 x 1.5 x 2.2 cm. Previously 1.5 x 1.7cm. Growth raises suspicion for malignancy, including lung cancer. No symptoms such as unintentional weight loss, hemoptysis, or fever. PET scan preferred over bronchoscopy due to invasiveness and anesthesia risks. - Ordered PET scan  - Scheduled follow-up appointment with Dr. Shelah in December   Almarie LELON Ferrari, NP 06/25/2024

## 2024-06-26 DIAGNOSIS — N186 End stage renal disease: Secondary | ICD-10-CM | POA: Diagnosis not present

## 2024-06-26 DIAGNOSIS — N2581 Secondary hyperparathyroidism of renal origin: Secondary | ICD-10-CM | POA: Diagnosis not present

## 2024-06-26 DIAGNOSIS — Z992 Dependence on renal dialysis: Secondary | ICD-10-CM | POA: Diagnosis not present

## 2024-06-27 ENCOUNTER — Ambulatory Visit

## 2024-06-27 ENCOUNTER — Ambulatory Visit: Admitting: Occupational Therapy

## 2024-06-27 DIAGNOSIS — R2681 Unsteadiness on feet: Secondary | ICD-10-CM

## 2024-06-27 DIAGNOSIS — M6281 Muscle weakness (generalized): Secondary | ICD-10-CM

## 2024-06-27 DIAGNOSIS — R41842 Visuospatial deficit: Secondary | ICD-10-CM

## 2024-06-27 DIAGNOSIS — R29818 Other symptoms and signs involving the nervous system: Secondary | ICD-10-CM

## 2024-06-27 DIAGNOSIS — R262 Difficulty in walking, not elsewhere classified: Secondary | ICD-10-CM | POA: Diagnosis not present

## 2024-06-27 NOTE — Therapy (Signed)
 OUTPATIENT PHYSICAL THERAPY NEURO TREATMENT   Patient Name: Austin [name] MRN: [id number] DOB:[birth year], 89 y.o., male Today's Date: 06/27/2024   PCP: Merna Huxley, NP  REFERRING PROVIDER: Merna Huxley, NP   END OF SESSION:  PT End of Session - 06/27/24 1407     Visit Number 6    Number of Visits 13    Date for Recertification  07/18/24    Authorization Type [employer]    Authorization Time Period 05/23/2024-07/18/2024    Authorization - Visit Number 6    Authorization - Number of Visits 13    PT Start Time 1400    PT Stop Time 1445    PT Time Calculation (min) 45 min    Equipment Utilized During Treatment Gait belt    Activity Tolerance Patient tolerated treatment well    Behavior During Therapy WFL for tasks assessed/performed           Past Medical History:  Diagnosis Date   Acute diastolic CHF (congestive heart failure) (HCC) 09/17/2020   Acute exacerbation of CHF (congestive heart failure) (HCC) 08/04/2019   Acute right PCA stroke (HCC) 04/25/2024   ANEMIA DUE TO CHRONIC BLOOD LOSS 03/13/2007   CAROTID ARTERY STENOSIS 05/10/2010   Coronary artery disease    DIABETES MELLITUS, TYPE II 09/19/2007   DISEASE, CEREBROVASCULAR NEC 03/05/2007   ESRD (end stage renal disease) on dialysis (HCC)    GERD 03/13/2007   HYPERLIPIDEMIA 03/05/2007   HYPERTENSION 03/05/2007   HYPOKALEMIA 11/09/2009   KNEE PAIN, RIGHT 11/09/2009   New onset a-fib (HCC) 04/27/2024   Eliquis    PROSTATE CANCER, HX OF 03/05/2007   Past Surgical History:  Procedure Laterality Date   A/V FISTULAGRAM N/A 03/21/2024   Procedure: A/V Fistulagram;  Surgeon: Magda Debby SAILOR, MD;  Location: [facility];  Service: Cardiovascular;  Laterality: N/A;   AV FISTULA PLACEMENT Left 10/18/2022   Procedure: INSERTION OF LEFT ARM BRACHIAL ARTERY TO AXILLARY VEIN ARTERIOVENOUS (AV) GORE-TEX GRAFT;  Surgeon: Lanis Fonda BRAVO, MD;  Location: [facility] OR;  Service: Vascular;  Laterality: Left;   CAROTID ARTERY  ANGIOPLASTY Right Oct. 10, 2001   ESOPHAGOGASTRODUODENOSCOPY (EGD) WITH PROPOFOL  N/A 11/04/2016   Procedure: ESOPHAGOGASTRODUODENOSCOPY (EGD) WITH PROPOFOL ;  Surgeon: Layla Lah, MD;  Location: [facility];  Service: Gastroenterology;  Laterality: N/A;   IR FLUORO GUIDE CV LINE RIGHT  10/25/2022   IR FLUORO GUIDE CV LINE RIGHT  10/27/2022   IR US  GUIDE VASC ACCESS RIGHT  10/25/2022   IR US  GUIDE VASC ACCESS RIGHT  10/27/2022   LAPAROSCOPIC APPENDECTOMY  02/20/2012   Procedure: APPENDECTOMY LAPAROSCOPIC;  Surgeon: Jina Nephew, MD;  Location: [facility] OR;  Service: General;  Laterality: N/A;   PROSTATE SURGERY     prostatectomy   Patient Active Problem List   Diagnosis Date Noted   New onset atrial fibrillation (HCC) 04/27/2024   Acute CVA (cerebrovascular accident) (HCC) 04/25/2024   Lumbar facet arthropathy 04/16/2024   Secondary hyperparathyroidism of renal origin 06/02/2023   Preoperative evaluation of a medical condition to rule out surgical contraindications (TAR required) 05/04/2023   Right carpal tunnel syndrome 03/16/2023   Coagulation defect, unspecified 11/07/2022   Personal history of nicotine dependence 11/01/2022   ESRD on dialysis (HCC) 10/29/2022   Protein-calorie malnutrition, severe 10/28/2022   Anemia in chronic kidney disease 10/28/2022   Hyperlipidemia, unspecified 10/28/2022   Pneumonia due to COVID-19 virus 10/25/2022   CKD (chronic kidney disease) stage 5, GFR less than 15 ml/min (HCC) 10/25/2022   Severe pulmonary  hypertension (HCC) 10/25/2022   Moderate aortic regurgitation 10/25/2022   CAD in native artery 10/25/2022   UTI (urinary tract infection) 09/27/2021   CHF exacerbation (HCC) 09/26/2021   Acute respiratory failure with hypoxia (HCC) 09/26/2021   Acute on chronic diastolic CHF (congestive heart failure) (HCC) 07/09/2021   Dyspnea 06/01/2021   Upper airway cough syndrome 04/06/2021   Porokeratosis 03/26/2021   Pulmonary nodules 10/22/2020   Acute on  chronic renal failure 10/09/2020   Mass of right lung 09/18/2020   Acute kidney injury superimposed on CKD 08/04/2019   Elevated troponin 08/04/2019   Hypokalemia 08/04/2019   Hypertensive urgency 03/28/2019   Elevated serum immunoglobulin free light chains 02/20/2019   Gastrointestinal hemorrhage with melena    Melena 11/03/2016   Carotid artery stenosis 05/10/2010   Type 2 diabetes mellitus with hyperlipidemia (HCC) 09/19/2007   Iron  deficiency anemia due to chronic blood loss 03/13/2007   GERD 03/13/2007   Essential hypertension 03/05/2007   DISEASE, CEREBROVASCULAR NEC 03/05/2007   PROSTATE CANCER, HX OF 03/05/2007    ONSET DATE: 04/25/24  REFERRING DIAG: I63.9 (ICD-10-CM) - Acute CVA (cerebrovascular accident) (HCC) R26.81 (ICD-10-CM) - Gait instability  THERAPY DIAG:  Other symptoms and signs involving the nervous system  Muscle weakness (generalized)  Unsteadiness on feet  Difficulty in walking, not elsewhere classified  Rationale for Evaluation and Treatment: Rehabilitation  SUBJECTIVE:                                                                                                                                                                                             SUBJECTIVE STATEMENT: Feeling fine, no new issues   Pt accompanied by: significant other and in waiting room  PERTINENT HISTORY: CHF, carotid artery stenosis, CAD, DMII, HLD, HTN, prostate CA, L AV fistula Dialysis: M, W, F  PAIN:  Are you having pain? No  PRECAUTIONS: Fall and Other: L AV fistula- no L UE BP   RED FLAGS: None   WEIGHT BEARING RESTRICTIONS: No  FALLS: Has patient fallen in last 6 months? No  LIVING ENVIRONMENT: Lives with: lives with their spouse Lives in: House/apartment Stairs: 3-4 steps to enter with railing; 2 story home Has following equipment at home: Vannie - 2 wheeled, Environmental Consultant - 4 wheeled, Wheelchair (manual), and quad tip cane  PLOF: Independent with basic  ADLs and wife was assisting with shower transfers, cooking, cleaning   PATIENT GOALS: maybe a little balance  OBJECTIVE:   TODAY'S TREATMENT: 06/27/24 Activity Comments  NU-step L5 x 8 min Cues to maintain 50+ SPM  Standing PF/DF 2x10 BUE support, cues for limiting knee flexion-momentum  Forward march/backwards walk x 2 min Sidestep x 2 min BUE support  Static multisensory balance Tendency for left lateral lean Mild-moderate postural perturbations with good compensation  Stepping forwards/sideways over hurdles Difficulty with left foot clearance, improved w/ preemptive verbal cues         TODAY'S TREATMENT: 06/25/24 Activity Comments  NU-step  L5 x 6 min  Cues to maintain 50+ SPM  Standing DF/PF 2x10 BUE support  -Forward march/retro step x 2 min -sidestepping x 2 min -lateral step-up/down x 2 min, 6 step 3#, // bar support  LAQ 3x10 3#             HOME EXERCISE PROGRAM: Access Code: [email or web address] URL: [email or web address] Date: 06/13/2024 Prepared by: [name] - Outpatient  Rehab - [facility]  Exercises - Heel Toe Raises with Counter Support  - 1 x daily - 4 x weekly - 2 sets - 10 reps - March in Place  - 1 x daily - 4 x weekly - 2 sets - 10 reps - Side Stepping with Counter Support  - 1 x daily - 4 x weekly - 1 sets - 3 reps  -------------------------------------------------------- Note: Objective measures were completed at Evaluation unless otherwise noted.  DIAGNOSTIC FINDINGS: 04/25/24 brain MRI: Acute infarct in the right PCA territory as above. 2. 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 3. Additional remote lacunar infarcts in the left thalamus. 4. Focus of signal abnormality and susceptibility within the left parietal lobe, possibly representing remote hemorrhage or vascular malformation such as cavernous malformation. Additional similar focus of signal abnormality and susceptibility along the right  dorsal aspect of the pons.  COGNITION: Overall cognitive status: difficult to discern; requires increased instruction for tasks occasionally    SENSATION: Pt reports N/T in UEs  COORDINATION: Alternating pronation/supination: slight dysmetria B Alternating toe tap: hypokinesia L>R Finger to nose: WNL B  MUSCLE TONE: WNL B LE  POSTURE: rounded shoulders, forward head, and shoulders elevations  LOWER EXTREMITY ROM:     Active  Right Eval Left Eval  Hip flexion    Hip extension    Hip abduction    Hip adduction    Hip internal rotation    Hip external rotation    Knee flexion    Knee extension    Ankle dorsiflexion 8 16  Ankle plantarflexion    Ankle inversion    Ankle eversion     (Blank rows = not tested)  LOWER EXTREMITY MMT:    MMT (in sitting) Right Eval Left Eval  Hip flexion 4+ 4+  Hip extension    Hip abduction 4- 4-  Hip adduction 4- 4-  Hip internal rotation    Hip external rotation    Knee flexion 3+ 3+  Knee extension 4+ 4  Ankle dorsiflexion 4 4  Ankle plantarflexion 4 4  Ankle inversion    Ankle eversion    (Blank rows = not tested)  GAIT: Findings: Assistive device utilized:quad tip cane, Level of assistance: SBA and CGA, and Comments: limited foot clearance B and tendency to shuffle feet; slowed *pt requires increased direction to navigate clinic environment; suspect vision vs. Cognition as a factor   FUNCTIONAL TESTS:  5 times sit to stand: 23.8 sec pushing off LEs without standing fully  10 meter walk test: 17.23 sec with cane (1.9 ft/sec)  TREATMENT DATE: 05/23/24    PATIENT EDUCATION: Education details: edu on benefits of OT for vision, prognosis, POC, exam findings as they relate for functional impairments  Person educated: Patient and Spouse Education method: Explanation Education comprehension:  verbalized understanding  HOME EXERCISE PROGRAM: Not yet initiated    GOALS: Goals reviewed with patient? Yes  SHORT TERM GOALS: Target date: 06/13/2024  Patient to be independent with initial HEP. Baseline: HEP initiated;pt requires cueing Goal status: partially MET    LONG TERM GOALS: Target date: 07/18/2024  Patient to be independent with advanced HEP. Baseline: Not yet initiated  Goal status: IN PROGRESS  to be improve to at least 400 ft for improved gait efficiency and endurance. Baseline: 365 ft Goal status: IN PROGRESS  Patient to score at least 15/24 on DGI in order to decrease risk of falls.  Baseline: 7 Goal status: IN PROGRESS  Patient to demonstrate 5xSTS test in <15 sec in order to decrease risk of falls.  Baseline: 23 sec Goal status: IN PROGRESS  Patient to demonstrate gait speed of at least 2.3 ft/sec in order to improve access to community.  Baseline: 1.9 ft/sec Goal status: IN PROGRESS   ASSESSMENT:  CLINICAL IMPRESSION: Emphasis on neuro re-ed for improving motor control, balance, single limb support, kinesthetic/proprioceptive awareness to enhance safety with obstacle negotiation and encouraging greater step height and amplitude of movement. Visual and verbal cues for best carryover.   OBJECTIVE IMPAIRMENTS: Abnormal gait, decreased activity tolerance, decreased balance, decreased coordination, decreased endurance, difficulty walking, decreased ROM, decreased strength, decreased safety awareness, impaired flexibility, and postural dysfunction.   ACTIVITY LIMITATIONS: carrying, lifting, bending, sitting, standing, squatting, stairs, transfers, bed mobility, bathing, toileting, dressing, reach over head, hygiene/grooming, and locomotion level  PARTICIPATION LIMITATIONS: meal prep, cleaning, shopping, community activity, and church  PERSONAL FACTORS: Age, Fitness, Past/current experiences, Time since onset of injury/illness/exacerbation, and 3+  comorbidities: CHF, carotid artery stenosis, CAD, DMII, HLD, HTN, prostate CA, L AV fistula are also affecting patient's functional outcome.   REHAB POTENTIAL: Good  CLINICAL DECISION MAKING: Evolving/moderate complexity  EVALUATION COMPLEXITY: Moderate  PLAN:  PT FREQUENCY: 1-2x/week  PT DURATION: 6 weeks  PLANNED INTERVENTIONS: [id number]- PT Re-evaluation, 97750- Physical Performance Testing, 97110-Therapeutic exercises, 97530- Therapeutic activity, [id number]- Neuromuscular re-education, 97535- Self Care, [id number]- Manual therapy, [id number]- Gait training, [phone]- Canalith repositioning, Patient/Family education, Balance training, Stair training, Taping, Vestibular training, DME instructions, Cryotherapy, and Moist heat  PLAN FOR NEXT SESSION: Review HEP for BLE strength and balance and update HEP as appropriate. Faster walking w/ rollator (uses one on dialysis days)    2:08 PM, 06/27/24 M. Kelly Symantha Steeber, PT, DPT Physical Therapist- [place] Office Number: [phone]

## 2024-06-27 NOTE — Therapy (Signed)
 OUTPATIENT OCCUPATIONAL THERAPY NEURO  Treatment Note  Patient Name: Austin [name] MRN: [id number] DOB:01/12/1935, 89 y.o., male Today's Date: 06/27/2024  PCP: Merna Huxley, NP REFERRING PROVIDER: Merna Huxley, NP  END OF SESSION:  OT End of Session - 06/27/24 1539     Visit Number 5    Number of Visits 10    Date for Recertification  07/26/24    Authorization Type [employer] 2025    Authorization Time Period Auth#: [id number] , approved 10 OT visits from 06/13/2024 - 07/26/2024    Authorization - Visit Number 5    Authorization - Number of Visits 10    OT Start Time 1450    OT Stop Time 1530    OT Time Calculation (min) 40 min             Past Medical History:  Diagnosis Date   Acute diastolic CHF (congestive heart failure) (HCC) 09/17/2020   Acute exacerbation of CHF (congestive heart failure) (HCC) 08/04/2019   Acute right PCA stroke (HCC) 04/25/2024   ANEMIA DUE TO CHRONIC BLOOD LOSS 03/13/2007   CAROTID ARTERY STENOSIS 05/10/2010   Coronary artery disease    DIABETES MELLITUS, TYPE II 09/19/2007   DISEASE, CEREBROVASCULAR NEC 03/05/2007   ESRD (end stage renal disease) on dialysis (HCC)    GERD 03/13/2007   HYPERLIPIDEMIA 03/05/2007   HYPERTENSION 03/05/2007   HYPOKALEMIA 11/09/2009   KNEE PAIN, RIGHT 11/09/2009   New onset a-fib (HCC) 04/27/2024   Eliquis    PROSTATE CANCER, HX OF 03/05/2007   Past Surgical History:  Procedure Laterality Date   A/V FISTULAGRAM N/A 03/21/2024   Procedure: A/V Fistulagram;  Surgeon: Magda Debby SAILOR, MD;  Location: [facility];  Service: Cardiovascular;  Laterality: N/A;   AV FISTULA PLACEMENT Left 10/18/2022   Procedure: INSERTION OF LEFT ARM BRACHIAL ARTERY TO AXILLARY VEIN ARTERIOVENOUS (AV) GORE-TEX GRAFT;  Surgeon: Lanis Fonda BRAVO, MD;  Location: [facility] OR;  Service: Vascular;  Laterality: Left;   CAROTID ARTERY ANGIOPLASTY Right Oct. 10, 2001   ESOPHAGOGASTRODUODENOSCOPY (EGD) WITH PROPOFOL  N/A 11/04/2016   Procedure:  ESOPHAGOGASTRODUODENOSCOPY (EGD) WITH PROPOFOL ;  Surgeon: Layla Lah, MD;  Location: [facility];  Service: Gastroenterology;  Laterality: N/A;   IR FLUORO GUIDE CV LINE RIGHT  10/25/2022   IR FLUORO GUIDE CV LINE RIGHT  10/27/2022   IR US  GUIDE VASC ACCESS RIGHT  10/25/2022   IR US  GUIDE VASC ACCESS RIGHT  10/27/2022   LAPAROSCOPIC APPENDECTOMY  02/20/2012   Procedure: APPENDECTOMY LAPAROSCOPIC;  Surgeon: Jina Nephew, MD;  Location: [facility] OR;  Service: General;  Laterality: N/A;   PROSTATE SURGERY     prostatectomy   Patient Active Problem List   Diagnosis Date Noted   New onset atrial fibrillation (HCC) 04/27/2024   Acute CVA (cerebrovascular accident) (HCC) 04/25/2024   Lumbar facet arthropathy 04/16/2024   Secondary hyperparathyroidism of renal origin 06/02/2023   Preoperative evaluation of a medical condition to rule out surgical contraindications (TAR required) 05/04/2023   Right carpal tunnel syndrome 03/16/2023   Coagulation defect, unspecified 11/07/2022   Personal history of nicotine dependence 11/01/2022   ESRD on dialysis (HCC) 10/29/2022   Protein-calorie malnutrition, severe 10/28/2022   Anemia in chronic kidney disease 10/28/2022   Hyperlipidemia, unspecified 10/28/2022   Pneumonia due to COVID-19 virus 10/25/2022   CKD (chronic kidney disease) stage 5, GFR less than 15 ml/min (HCC) 10/25/2022   Severe pulmonary hypertension (HCC) 10/25/2022   Moderate aortic regurgitation 10/25/2022   CAD in native artery 10/25/2022  UTI (urinary tract infection) 09/27/2021   CHF exacerbation (HCC) 09/26/2021   Acute respiratory failure with hypoxia (HCC) 09/26/2021   Acute on chronic diastolic CHF (congestive heart failure) (HCC) 07/09/2021   Dyspnea 06/01/2021   Upper airway cough syndrome 04/06/2021   Porokeratosis 03/26/2021   Pulmonary nodules 10/22/2020   Acute on chronic renal failure 10/09/2020   Mass of right lung 09/18/2020   Acute kidney injury superimposed on CKD  08/04/2019   Elevated troponin 08/04/2019   Hypokalemia 08/04/2019   Hypertensive urgency 03/28/2019   Elevated serum immunoglobulin free light chains 02/20/2019   Gastrointestinal hemorrhage with melena    Melena 11/03/2016   Carotid artery stenosis 05/10/2010   Type 2 diabetes mellitus with hyperlipidemia (HCC) 09/19/2007   Iron  deficiency anemia due to chronic blood loss 03/13/2007   GERD 03/13/2007   Essential hypertension 03/05/2007   DISEASE, CEREBROVASCULAR NEC 03/05/2007   PROSTATE CANCER, HX OF 03/05/2007    ONSET DATE: referral date 05/24/24  REFERRING DIAG: H53.9 (ICD-10-CM) - Visual disturbance  THERAPY DIAG:  Visuospatial deficit  Other symptoms and signs involving the nervous system  Rationale for Evaluation and Treatment: Rehabilitation  SUBJECTIVE:   SUBJECTIVE STATEMENT: Pt reports have some dental work today.  Pt accompanied by: self  PERTINENT HISTORY: Admitted to [facility] on 9/4 with visual changes over the last 4 days. MRI reveals acute R PCA infarct and remote R basal ganglia infarct with evidence of prior hemorrhage.   H/o: CHF, carotid artery stenosis, CAD, DMII, HLD, HTN, prostate CA, L AV fistula Dialysis: M, W, F  PRECAUTIONS: Fall and Other: L AV fistula- no L UE BP   WEIGHT BEARING RESTRICTIONS: No  PAIN:  Are you having pain? No  FALLS: Has patient fallen in last 6 months? No  LIVING ENVIRONMENT: Lives with: lives with their spouse Lives in: House/apartment Stairs: 3-4 steps to enter with railing; 2 story home with bedroom/full bath on the second floor Has following equipment at home: Vannie - 2 wheeled, Environmental Consultant - 4 wheeled, Wheelchair (manual), and quad tip cane  PLOF: Independent with basic ADLs and wife was assisting with shower transfers, cooking, cleaning   PATIENT GOALS: strategies to aid in vision; more energy to do things  OBJECTIVE:  Note: Objective measures were completed at Evaluation unless otherwise noted.  HAND  DOMINANCE: Right  ADLs: Transfers/ambulation related to ADLs: using quad tip cane and cues for L attention Eating: Mod I Grooming: some difficulty with shaving, but I manage UB Dressing: Mod I LB Dressing: Mod I Toileting: Mod I Bathing: occasional assist with washing back Tub Shower transfers: very difficult to get out of garden tub  IADLs: Shopping: wife completes Light housekeeping: wife completes Meal Prep: would make breakfast prior to stroke, but has not since Community mobility: not driving Medication management: spouse administers meds, has used a pill box in the past Financial management: wife completes  MOBILITY STATUS: utilizing quad tip cane   POSTURE COMMENTS:  rounded shoulders, forward head, and shoulders elevations   ACTIVITY TOLERANCE: Activity tolerance: reports decreased activity tolerance  UPPER EXTREMITY ROM:  WFL bilaterally   UPPER EXTREMITY MMT:   4+5 overall   COORDINATION: 9 Hole Peg test: Right: 47.44 sec; Left: 58.57 sec - pt picking up additional pegs to place due to decreased visual attention and looking for additional holes  SENSATION: WFL  COGNITION: Overall cognitive status: spouse reports that he gets confused more since the stroke than before  VISION: Subjective report: decreased attention to L  Baseline vision: Bifocals and Wears glasses all the time Visual history: cataracts, had those removed  VISION ASSESSMENT: Gaze preference/alignment: WDL Tracking/Visual pursuits: Decreased smoothness with horizontal tracking Saccades: undershoots, decreased speed of saccadic movements, and decreased scanning to L visual field Visual Fields: Left visual field deficits Confrontation testing: pt unable to identify stimulus in Left visual field with or without stimulus on Right Line bisection: pt correctly bisecting 4/6 lines, verbally stating where is the end of the line and turning head to scan full paper in 2 instances.  Patient  has difficulty with following activities due to following visual impairments: reading from medical list as pt frequently omitting words on far left  PERCEPTION: Impaired: Inattention/neglect: does not attend to left visual field  OBSERVATIONS: Pt requiring verbal cues for visual attention and scanning to Left while navigating through treatment space as pt initially not able to see treatment room on Left side of gym.                                                                                                                             TREATMENT DATE:  06/27/24 Trail making: Completed Trail making A in 5:40.  OT providing red line to left to facilitate increased scanning to left visual field.   Pt requiring increased time but with good recall to scan and turn head to left when not locating number without any additional cue from therapist.  Pt demonstrating initial good attention to task, however nodding off after number 21 and requiring cues to arouse and attend to task. Visual attention: engaged in pattern replication with small pegs to address visual scanning and visual attention.  Pattern and peg board placed at midline and pegs placed on R, with cues to place L hand on L to encourage scanning to L hand each time to facilitate visual attention to hole board/pattern.  OT adding placemat under pegboard to provide contrast color to increase attention to L side of board and then incorporating moderate cues to attend to L side. Pt completing R side of pattern, however requiring increased time and verbal cues to scan to far left, bottom corner of pegboard when placing pegs left of midline.     06/25/24 Jigsaw puzzle: OT setting up puzzle on red place mat to provide visual board to allow pt to locate items on far Left.  OT also encouraging pt to place L hand on table to scan to hand as additional visual anchor.  Pt demonstrating max difficulty with scanning to L and nodding off when OT providing with  increased time for sequencing, therefore terminated task to engage in more engaging task.   Matching cards: utilized educational psychologist cards with matching pair in left visual field.  Pt able to complete with increased time and verbal cues to maintain awake and engaged in task.  OT transitioned to deck of cards, challenging pt to sort them in numerical order - therefore needing to scan to L periodically  to locate matching cards in L visual field. Visual scanning: engaged in scanning letters, naming underlined letters aloud.  Pt completing 5 lines with use of bookmark for lines 4 and 5 to increase visual attention to task.  Pt omitting 3/14 letters when scanning.   06/20/24 Visual attention: engaged in visual scanning and attention to complete peg board pattern replication.  Pattern and peg board placed at midline and pegs placed on L to facilitate visual scanning to L side.  Pt completing R side of pattern, however requiring increased time and verbal cues to scan to far left corner of pegboard when placing pegs left of midline. OT providing cues and visual target on L to increase attention to L, encouraging pt to utilize L hand as marker to visually scan towards.  Reviewed worksheets: pt omitting one number when scanning from 1-20, number 12 was in far left visual field.  Pt omitting 9 of 21 88  when scanning lines, 8/9 at midline and left of midline.  Completed 88 scanning worksheet again with pt able to locate 8 of 9 initially omitted numbers.   Visual scanning: locating additional 3 items in I Spy activity.  Pt requiring increased time to locate, however with use of visual anchor on L, pt with improved ability to locate 6/6 volcanoes, 4/4 leaves, and 2/2 waves.    PATIENT EDUCATION: Education details: visual scanning and Left anchor during table top tasks Person educated: Patient and Spouse Education method: Explanation, Verbal cues, and Handouts Education comprehension: verbalized understanding and needs  further education  HOME EXERCISE PROGRAM: Visual spatial awareness - see pt instructions   GOALS: Goals reviewed with patient? Yes  SHORT TERM GOALS: Target date: 07/05/24  Pt and spouse with verbalize and demonstrate understanding of visual compensatory strategies. Baseline: Goal status: in progress  2.  Pt will be independent in visual exercises to increase awareness and vision in L visual field.  Baseline:  Goal status: in progress  3.  Pt will complete table top scanning activity with min cues for technique. Baseline:  Goal status: in progress  4.  Pt will verbalize understanding of safe tub/shower transfers. Baseline:  Goal status: in progress   LONG TERM GOALS: Target date: 07/26/24  Pt will navigate a moderately busy environment, following multi-step commands with 90% accuracy Baseline:  Goal status: in progress  2.  Pt will complete table top scanning activity with Supervision in standing for increased visual attention and standing tolerance. Baseline:  Goal status: in progress  3.  Pt will demonstrate improved fine motor coordination for ADLs and visual attention to Left visual field as evidenced by decreasing 9 hole peg test score for BUE by 5 secs on RUE and 10 seconds on LUE.  Baseline: Right: 47.44 sec; Left: 58.57 sec Goal status: in progress  4.  Pt will verbalize and/or demonstrate understanding of energy conservation strategies to increase engagement in and safety with ADLs and IADLs.  Baseline:  Goal status: in progress    ASSESSMENT:  CLINICAL IMPRESSION: Patient is a 89 y.o. male who was seen today for occupational therapy treatment for visual deficits s/p CVA. Pt demonstrating impairments of L visual field and inattention.  OT positioning self to pt's left at table top and when ambulating to increase visual scanning to left and provide buffer during ambulation.  Pt continues to be limited in engagement in visual scanning and attention tasks due  to fatigue level.  Pt nodding off during more quiet tasks.  Pt continues  to benefit from verbal cues and anchor to scan to L during table top tasks, still benefiting from therapist involvement from a vision standpoint but mostly an arousal standpoint. Pt will continue to benefit from skilled occupational therapy services to address balance, GM/FM control, activity tolerance, safety awareness, introduction of compensatory strategies/AE prn, visual-perception, and implementation of an HEP to improve participation and safety during ADLs and IADLs.    PERFORMANCE DEFICITS: in functional skills including ADLs, IADLs, Fine motor control, balance, body mechanics, endurance, decreased knowledge of precautions, decreased knowledge of use of DME, and vision, cognitive skills including memory and safety awareness, and psychosocial skills including coping strategies, environmental adaptation, and routines and behaviors.     PLAN:  OT FREQUENCY: 2x/week for 3 weeks, then 1x/week for additional 3 weeks  OT DURATION: 6 weeks  PLANNED INTERVENTIONS: 97168 OT Re-evaluation, 97535 self care/ADL training, 02889 therapeutic exercise, 97530 therapeutic activity, 97112 neuromuscular re-education, functional mobility training, visual/perceptual remediation/compensation, psychosocial skills training, energy conservation, coping strategies training, patient/family education, and DME and/or AE instructions  RECOMMENDED OTHER SERVICES: NA  CONSULTED AND AGREED WITH PLAN OF CARE: Patient and family member/caregiver  PLAN FOR NEXT SESSION: complete peg board pattern replication with small pegs, educate on visual scanning during table top and with mobility.  Incorporate mobility with scavenger hunt type tasks for arousal and participation    KAYLENE DOMINO, OTR/L 06/27/2024, 3:40 PM   [facility] [address] [place], [address] [place], [place] [address] Phone # [phone] Fax #  [phone]

## 2024-07-02 ENCOUNTER — Ambulatory Visit: Admitting: Occupational Therapy

## 2024-07-02 ENCOUNTER — Ambulatory Visit

## 2024-07-02 DIAGNOSIS — R2681 Unsteadiness on feet: Secondary | ICD-10-CM

## 2024-07-02 DIAGNOSIS — R262 Difficulty in walking, not elsewhere classified: Secondary | ICD-10-CM | POA: Diagnosis not present

## 2024-07-02 DIAGNOSIS — M6281 Muscle weakness (generalized): Secondary | ICD-10-CM

## 2024-07-02 DIAGNOSIS — R29818 Other symptoms and signs involving the nervous system: Secondary | ICD-10-CM

## 2024-07-02 DIAGNOSIS — R41842 Visuospatial deficit: Secondary | ICD-10-CM | POA: Diagnosis not present

## 2024-07-02 NOTE — Therapy (Signed)
 OUTPATIENT PHYSICAL THERAPY NEURO TREATMENT   Patient Name: Austin [name] MRN: [id number] DOB:Feb 12, 1935, 89 y.o., male Today's Date: 07/02/2024   PCP: Merna Huxley, NP  REFERRING PROVIDER: Merna Huxley, NP   END OF SESSION:  PT End of Session - 07/02/24 1407     Visit Number 7    Number of Visits 13    Date for Recertification  07/18/24    Authorization Type [employer]    Authorization Time Period 05/23/2024-07/18/2024    Authorization - Visit Number 7    Authorization - Number of Visits 13    PT Start Time 1405    PT Stop Time 1445    PT Time Calculation (min) 40 min    Equipment Utilized During Treatment Gait belt    Activity Tolerance Patient tolerated treatment well    Behavior During Therapy WFL for tasks assessed/performed           Past Medical History:  Diagnosis Date   Acute diastolic CHF (congestive heart failure) (HCC) 09/17/2020   Acute exacerbation of CHF (congestive heart failure) (HCC) 08/04/2019   Acute right PCA stroke (HCC) 04/25/2024   ANEMIA DUE TO CHRONIC BLOOD LOSS 03/13/2007   CAROTID ARTERY STENOSIS 05/10/2010   Coronary artery disease    DIABETES MELLITUS, TYPE II 09/19/2007   DISEASE, CEREBROVASCULAR NEC 03/05/2007   ESRD (end stage renal disease) on dialysis (HCC)    GERD 03/13/2007   HYPERLIPIDEMIA 03/05/2007   HYPERTENSION 03/05/2007   HYPOKALEMIA 11/09/2009   KNEE PAIN, RIGHT 11/09/2009   New onset a-fib (HCC) 04/27/2024   Eliquis    PROSTATE CANCER, HX OF 03/05/2007   Past Surgical History:  Procedure Laterality Date   A/V FISTULAGRAM N/A 03/21/2024   Procedure: A/V Fistulagram;  Surgeon: Magda Debby SAILOR, MD;  Location: [facility];  Service: Cardiovascular;  Laterality: N/A;   AV FISTULA PLACEMENT Left 10/18/2022   Procedure: INSERTION OF LEFT ARM BRACHIAL ARTERY TO AXILLARY VEIN ARTERIOVENOUS (AV) GORE-TEX GRAFT;  Surgeon: Lanis Fonda BRAVO, MD;  Location: [facility];  Service: Vascular;  Laterality: Left;   CAROTID ARTERY  ANGIOPLASTY Right Oct. 10, 2001   ESOPHAGOGASTRODUODENOSCOPY (EGD) WITH PROPOFOL  N/A 11/04/2016   Procedure: ESOPHAGOGASTRODUODENOSCOPY (EGD) WITH PROPOFOL ;  Surgeon: Layla Lah, MD;  Location: [facility];  Service: Gastroenterology;  Laterality: N/A;   IR FLUORO GUIDE CV LINE RIGHT  10/25/2022   IR FLUORO GUIDE CV LINE RIGHT  10/27/2022   IR US  GUIDE VASC ACCESS RIGHT  10/25/2022   IR US  GUIDE VASC ACCESS RIGHT  10/27/2022   LAPAROSCOPIC APPENDECTOMY  02/20/2012   Procedure: APPENDECTOMY LAPAROSCOPIC;  Surgeon: Jina Nephew, MD;  Location: [facility];  Service: General;  Laterality: N/A;   PROSTATE SURGERY     prostatectomy   Patient Active Problem List   Diagnosis Date Noted   New onset atrial fibrillation (HCC) 04/27/2024   Acute CVA (cerebrovascular accident) (HCC) 04/25/2024   Lumbar facet arthropathy 04/16/2024   Secondary hyperparathyroidism of renal origin 06/02/2023   Preoperative evaluation of a medical condition to rule out surgical contraindications (TAR required) 05/04/2023   Right carpal tunnel syndrome 03/16/2023   Coagulation defect, unspecified 11/07/2022   Personal history of nicotine dependence 11/01/2022   ESRD on dialysis (HCC) 10/29/2022   Protein-calorie malnutrition, severe 10/28/2022   Anemia in chronic kidney disease 10/28/2022   Hyperlipidemia, unspecified 10/28/2022   Pneumonia due to COVID-19 virus 10/25/2022   CKD (chronic kidney disease) stage 5, GFR less than 15 ml/min (HCC) 10/25/2022   Severe pulmonary  hypertension (HCC) 10/25/2022   Moderate aortic regurgitation 10/25/2022   CAD in native artery 10/25/2022   UTI (urinary tract infection) 09/27/2021   CHF exacerbation (HCC) 09/26/2021   Acute respiratory failure with hypoxia (HCC) 09/26/2021   Acute on chronic diastolic CHF (congestive heart failure) (HCC) 07/09/2021   Dyspnea 06/01/2021   Upper airway cough syndrome 04/06/2021   Porokeratosis 03/26/2021   Pulmonary nodules 10/22/2020   Acute on  chronic renal failure 10/09/2020   Mass of right lung 09/18/2020   Acute kidney injury superimposed on CKD 08/04/2019   Elevated troponin 08/04/2019   Hypokalemia 08/04/2019   Hypertensive urgency 03/28/2019   Elevated serum immunoglobulin free light chains 02/20/2019   Gastrointestinal hemorrhage with melena    Melena 11/03/2016   Carotid artery stenosis 05/10/2010   Type 2 diabetes mellitus with hyperlipidemia (HCC) 09/19/2007   Iron  deficiency anemia due to chronic blood loss 03/13/2007   GERD 03/13/2007   Essential hypertension 03/05/2007   DISEASE, CEREBROVASCULAR NEC 03/05/2007   PROSTATE CANCER, HX OF 03/05/2007    ONSET DATE: 04/25/24  REFERRING DIAG: I63.9 (ICD-10-CM) - Acute CVA (cerebrovascular accident) (HCC) R26.81 (ICD-10-CM) - Gait instability  THERAPY DIAG:  Other symptoms and signs involving the nervous system  Muscle weakness (generalized)  Unsteadiness on feet  Difficulty in walking, not elsewhere classified  Rationale for Evaluation and Treatment: Rehabilitation  SUBJECTIVE:                                                                                                                                                                                             SUBJECTIVE STATEMENT: Doing well, forgot my cane today    Pt accompanied by: significant other and in waiting room  PERTINENT HISTORY: CHF, carotid artery stenosis, CAD, DMII, HLD, HTN, prostate CA, L AV fistula Dialysis: M, W, F  PAIN:  Are you having pain? No  PRECAUTIONS: Fall and Other: L AV fistula- no L UE BP   RED FLAGS: None   WEIGHT BEARING RESTRICTIONS: No  FALLS: Has patient fallen in last 6 months? No  LIVING ENVIRONMENT: Lives with: lives with their spouse Lives in: House/apartment Stairs: 3-4 steps to enter with railing; 2 story home Has following equipment at home: Vannie - 2 wheeled, Environmental Consultant - 4 wheeled, Wheelchair (manual), and quad tip cane  PLOF: Independent with  basic ADLs and wife was assisting with shower transfers, cooking, cleaning   PATIENT GOALS: maybe a little balance  OBJECTIVE:   TODAY'S TREATMENT: 07/02/24 Activity Comments  NU-step resistance intervals x 8 min 2 min warm-up 30 sec heavy; 60 sec light For alt coordination and force  production  Static multisensory Improved postural control  Dynamic balance Single limb support and obstacles  LE lift-off For single limb balance           TODAY'S TREATMENT: 06/27/24 Activity Comments  NU-step L5 x 8 min Cues to maintain 50+ SPM  Standing PF/DF 2x10 BUE support, cues for limiting knee flexion-momentum  Forward march/backwards walk x 2 min Sidestep x 2 min BUE support  Static multisensory balance Tendency for left lateral lean Mild-moderate postural perturbations with good compensation  Stepping forwards/sideways over hurdles Difficulty with left foot clearance, improved w/ preemptive verbal cues            HOME EXERCISE PROGRAM: Access Code: [email or web address] URL: [email or web address] Date: 06/13/2024 Prepared by: [facility] - Outpatient  Rehab - [facility]  Exercises - Heel Toe Raises with Counter Support  - 1 x daily - 4 x weekly - 2 sets - 10 reps - March in Place  - 1 x daily - 4 x weekly - 2 sets - 10 reps - Side Stepping with Counter Support  - 1 x daily - 4 x weekly - 1 sets - 3 reps - Standing Foot Lift on Box (BKA)  - 1 x daily - 7 x weekly - 1-3 sets - 10 reps -------------------------------------------------------- Note: Objective measures were completed at Evaluation unless otherwise noted.  DIAGNOSTIC FINDINGS: 04/25/24 brain MRI: Acute infarct in the right PCA territory as above. 2. 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 3. Additional remote lacunar infarcts in the left thalamus. 4. Focus of signal abnormality and susceptibility within the left parietal lobe, possibly representing remote hemorrhage or  vascular malformation such as cavernous malformation. Additional similar focus of signal abnormality and susceptibility along the right dorsal aspect of the pons.  COGNITION: Overall cognitive status: difficult to discern; requires increased instruction for tasks occasionally    SENSATION: Pt reports N/T in UEs  COORDINATION: Alternating pronation/supination: slight dysmetria B Alternating toe tap: hypokinesia L>R Finger to nose: WNL B  MUSCLE TONE: WNL B LE  POSTURE: rounded shoulders, forward head, and shoulders elevations  LOWER EXTREMITY ROM:     Active  Right Eval Left Eval  Hip flexion    Hip extension    Hip abduction    Hip adduction    Hip internal rotation    Hip external rotation    Knee flexion    Knee extension    Ankle dorsiflexion 8 16  Ankle plantarflexion    Ankle inversion    Ankle eversion     (Blank rows = not tested)  LOWER EXTREMITY MMT:    MMT (in sitting) Right Eval Left Eval  Hip flexion 4+ 4+  Hip extension    Hip abduction 4- 4-  Hip adduction 4- 4-  Hip internal rotation    Hip external rotation    Knee flexion 3+ 3+  Knee extension 4+ 4  Ankle dorsiflexion 4 4  Ankle plantarflexion 4 4  Ankle inversion    Ankle eversion    (Blank rows = not tested)  GAIT: Findings: Assistive device utilized:quad tip cane, Level of assistance: SBA and CGA, and Comments: limited foot clearance B and tendency to shuffle feet; slowed *pt requires increased direction to navigate clinic environment; suspect vision vs. Cognition as a factor   FUNCTIONAL TESTS:  5 times sit to stand: 23.8 sec pushing off LEs without standing fully  10 meter walk test: 17.23 sec with cane (1.9 ft/sec)  TREATMENT DATE: 05/23/24    PATIENT EDUCATION: Education details: edu on benefits of OT for vision, prognosis, POC, exam findings as  they relate for functional impairments  Person educated: Patient and Spouse Education method: Explanation Education comprehension: verbalized understanding  HOME EXERCISE PROGRAM: Not yet initiated    GOALS: Goals reviewed with patient? Yes  SHORT TERM GOALS: Target date: 06/13/2024  Patient to be independent with initial HEP. Baseline: HEP initiated;pt requires cueing Goal status: partially MET    LONG TERM GOALS: Target date: 07/18/2024  Patient to be independent with advanced HEP. Baseline: Not yet initiated  Goal status: IN PROGRESS  to be improve to at least 400 ft for improved gait efficiency and endurance. Baseline: 365 ft Goal status: IN PROGRESS  Patient to score at least 15/24 on DGI in order to decrease risk of falls.  Baseline: 7 Goal status: IN PROGRESS  Patient to demonstrate 5xSTS test in <15 sec in order to decrease risk of falls.  Baseline: 23 sec Goal status: IN PROGRESS  Patient to demonstrate gait speed of at least 2.3 ft/sec in order to improve access to community.  Baseline: 1.9 ft/sec Goal status: IN PROGRESS   ASSESSMENT:  CLINICAL IMPRESSION: Continued with training for improved balance and facilitation of righting reactions to reduce risk for falls. Improved static balance wih normal-mild sway.  Difficulty with LLE single limb support during dynamic tasks and weight shifting. Focus on improving control for this and HEP addition to address  OBJECTIVE IMPAIRMENTS: Abnormal gait, decreased activity tolerance, decreased balance, decreased coordination, decreased endurance, difficulty walking, decreased ROM, decreased strength, decreased safety awareness, impaired flexibility, and postural dysfunction.   ACTIVITY LIMITATIONS: carrying, lifting, bending, sitting, standing, squatting, stairs, transfers, bed mobility, bathing, toileting, dressing, reach over head, hygiene/grooming, and locomotion level  PARTICIPATION LIMITATIONS: meal prep,  cleaning, shopping, community activity, and church  PERSONAL FACTORS: Age, Fitness, Past/current experiences, Time since onset of injury/illness/exacerbation, and 3+ comorbidities: CHF, carotid artery stenosis, CAD, DMII, HLD, HTN, prostate CA, L AV fistula are also affecting patient's functional outcome.   REHAB POTENTIAL: Good  CLINICAL DECISION MAKING: Evolving/moderate complexity  EVALUATION COMPLEXITY: Moderate  PLAN:  PT FREQUENCY: 1-2x/week  PT DURATION: 6 weeks  PLANNED INTERVENTIONS: [id number]- PT Re-evaluation, 97750- Physical Performance Testing, 97110-Therapeutic exercises, 97530- Therapeutic activity, [id number]- Neuromuscular re-education, 97535- Self Care, [id number]- Manual therapy, [id number]- Gait training, [phone]- Canalith repositioning, Patient/Family education, Balance training, Stair training, Taping, Vestibular training, DME instructions, Cryotherapy, and Moist heat  PLAN FOR NEXT SESSION: Review HEP for BLE strength and balance and update HEP as appropriate. Faster walking w/ rollator (uses one on dialysis days)    2:10 PM, 07/02/24 M. Kelly Kiaria Quinnell, PT, DPT Physical Therapist- [place] Office Number: [phone]

## 2024-07-02 NOTE — Therapy (Signed)
 OUTPATIENT OCCUPATIONAL THERAPY NEURO  Treatment Note  Patient Name: Austin [name] MRN: [id number] DOB:11/29/1934, 89 y.o., male Today's Date: 07/02/2024  PCP: Merna Huxley, NP REFERRING PROVIDER: Merna Huxley, NP  END OF SESSION:  OT End of Session - 07/02/24 1502     Visit Number 6    Number of Visits 10    Date for Recertification  07/26/24    Authorization Type [employer] 2025    Authorization Time Period Auth#: [id number] , approved 10 OT visits from 06/13/2024 - 07/26/2024    Authorization - Visit Number 6    Authorization - Number of Visits 10    OT Start Time 1448    OT Stop Time 1527    OT Time Calculation (min) 39 min              Past Medical History:  Diagnosis Date   Acute diastolic CHF (congestive heart failure) (HCC) 09/17/2020   Acute exacerbation of CHF (congestive heart failure) (HCC) 08/04/2019   Acute right PCA stroke (HCC) 04/25/2024   ANEMIA DUE TO CHRONIC BLOOD LOSS 03/13/2007   CAROTID ARTERY STENOSIS 05/10/2010   Coronary artery disease    DIABETES MELLITUS, TYPE II 09/19/2007   DISEASE, CEREBROVASCULAR NEC 03/05/2007   ESRD (end stage renal disease) on dialysis (HCC)    GERD 03/13/2007   HYPERLIPIDEMIA 03/05/2007   HYPERTENSION 03/05/2007   HYPOKALEMIA 11/09/2009   KNEE PAIN, RIGHT 11/09/2009   New onset a-fib (HCC) 04/27/2024   Eliquis    PROSTATE CANCER, HX OF 03/05/2007   Past Surgical History:  Procedure Laterality Date   A/V FISTULAGRAM N/A 03/21/2024   Procedure: A/V Fistulagram;  Surgeon: Magda Debby SAILOR, MD;  Location: [facility];  Service: Cardiovascular;  Laterality: N/A;   AV FISTULA PLACEMENT Left 10/18/2022   Procedure: INSERTION OF LEFT ARM BRACHIAL ARTERY TO AXILLARY VEIN ARTERIOVENOUS (AV) GORE-TEX GRAFT;  Surgeon: Lanis Fonda BRAVO, MD;  Location: [facility] OR;  Service: Vascular;  Laterality: Left;   CAROTID ARTERY ANGIOPLASTY Right Oct. 10, 2001   ESOPHAGOGASTRODUODENOSCOPY (EGD) WITH PROPOFOL  N/A 11/04/2016    Procedure: ESOPHAGOGASTRODUODENOSCOPY (EGD) WITH PROPOFOL ;  Surgeon: Layla Lah, MD;  Location: [facility];  Service: Gastroenterology;  Laterality: N/A;   IR FLUORO GUIDE CV LINE RIGHT  10/25/2022   IR FLUORO GUIDE CV LINE RIGHT  10/27/2022   IR US  GUIDE VASC ACCESS RIGHT  10/25/2022   IR US  GUIDE VASC ACCESS RIGHT  10/27/2022   LAPAROSCOPIC APPENDECTOMY  02/20/2012   Procedure: APPENDECTOMY LAPAROSCOPIC;  Surgeon: Jina Nephew, MD;  Location: [facility] OR;  Service: General;  Laterality: N/A;   PROSTATE SURGERY     prostatectomy   Patient Active Problem List   Diagnosis Date Noted   New onset atrial fibrillation (HCC) 04/27/2024   Acute CVA (cerebrovascular accident) (HCC) 04/25/2024   Lumbar facet arthropathy 04/16/2024   Secondary hyperparathyroidism of renal origin 06/02/2023   Preoperative evaluation of a medical condition to rule out surgical contraindications (TAR required) 05/04/2023   Right carpal tunnel syndrome 03/16/2023   Coagulation defect, unspecified 11/07/2022   Personal history of nicotine dependence 11/01/2022   ESRD on dialysis (HCC) 10/29/2022   Protein-calorie malnutrition, severe 10/28/2022   Anemia in chronic kidney disease 10/28/2022   Hyperlipidemia, unspecified 10/28/2022   Pneumonia due to COVID-19 virus 10/25/2022   CKD (chronic kidney disease) stage 5, GFR less than 15 ml/min (HCC) 10/25/2022   Severe pulmonary hypertension (HCC) 10/25/2022   Moderate aortic regurgitation 10/25/2022   CAD in native artery 10/25/2022  UTI (urinary tract infection) 09/27/2021   CHF exacerbation (HCC) 09/26/2021   Acute respiratory failure with hypoxia (HCC) 09/26/2021   Acute on chronic diastolic CHF (congestive heart failure) (HCC) 07/09/2021   Dyspnea 06/01/2021   Upper airway cough syndrome 04/06/2021   Porokeratosis 03/26/2021   Pulmonary nodules 10/22/2020   Acute on chronic renal failure 10/09/2020   Mass of right lung 09/18/2020   Acute kidney injury superimposed on  CKD 08/04/2019   Elevated troponin 08/04/2019   Hypokalemia 08/04/2019   Hypertensive urgency 03/28/2019   Elevated serum immunoglobulin free light chains 02/20/2019   Gastrointestinal hemorrhage with melena    Melena 11/03/2016   Carotid artery stenosis 05/10/2010   Type 2 diabetes mellitus with hyperlipidemia (HCC) 09/19/2007   Iron  deficiency anemia due to chronic blood loss 03/13/2007   GERD 03/13/2007   Essential hypertension 03/05/2007   DISEASE, CEREBROVASCULAR NEC 03/05/2007   PROSTATE CANCER, HX OF 03/05/2007    ONSET DATE: referral date 05/24/24  REFERRING DIAG: H53.9 (ICD-10-CM) - Visual disturbance  THERAPY DIAG:  Other symptoms and signs involving the nervous system  Visuospatial deficit  Unsteadiness on feet  Rationale for Evaluation and Treatment: Rehabilitation  SUBJECTIVE:   SUBJECTIVE STATEMENT: Pt reports that he feels like he got better sleep over the weekend.  Pt accompanied by: self  PERTINENT HISTORY: Admitted to [facility] on 9/4 with visual changes over the last 4 days. MRI reveals acute R PCA infarct and remote R basal ganglia infarct with evidence of prior hemorrhage.   H/o: CHF, carotid artery stenosis, CAD, DMII, HLD, HTN, prostate CA, L AV fistula Dialysis: M, W, F  PRECAUTIONS: Fall and Other: L AV fistula- no L UE BP   WEIGHT BEARING RESTRICTIONS: No  PAIN:  Are you having pain? No  FALLS: Has patient fallen in last 6 months? No  LIVING ENVIRONMENT: Lives with: lives with their spouse Lives in: House/apartment Stairs: 3-4 steps to enter with railing; 2 story home with bedroom/full bath on the second floor Has following equipment at home: Vannie - 2 wheeled, Environmental Consultant - 4 wheeled, Wheelchair (manual), and quad tip cane  PLOF: Independent with basic ADLs and wife was assisting with shower transfers, cooking, cleaning   PATIENT GOALS: strategies to aid in vision; more energy to do things  OBJECTIVE:  Note: Objective measures were  completed at Evaluation unless otherwise noted.  HAND DOMINANCE: Right  ADLs: Transfers/ambulation related to ADLs: using quad tip cane and cues for L attention Eating: Mod I Grooming: some difficulty with shaving, but I manage UB Dressing: Mod I LB Dressing: Mod I Toileting: Mod I Bathing: occasional assist with washing back Tub Shower transfers: very difficult to get out of garden tub  IADLs: Shopping: wife completes Light housekeeping: wife completes Meal Prep: would make breakfast prior to stroke, but has not since Community mobility: not driving Medication management: spouse administers meds, has used a pill box in the past Financial management: wife completes  MOBILITY STATUS: utilizing quad tip cane   POSTURE COMMENTS:  rounded shoulders, forward head, and shoulders elevations   ACTIVITY TOLERANCE: Activity tolerance: reports decreased activity tolerance  UPPER EXTREMITY ROM:  WFL bilaterally   UPPER EXTREMITY MMT:   4+5 overall   COORDINATION: 9 Hole Peg test: Right: 47.44 sec; Left: 58.57 sec - pt picking up additional pegs to place due to decreased visual attention and looking for additional holes  SENSATION: WFL  COGNITION: Overall cognitive status: spouse reports that he gets confused more since the stroke  than before  VISION: Subjective report: decreased attention to L Baseline vision: Bifocals and Wears glasses all the time Visual history: cataracts, had those removed  VISION ASSESSMENT: Gaze preference/alignment: WDL Tracking/Visual pursuits: Decreased smoothness with horizontal tracking Saccades: undershoots, decreased speed of saccadic movements, and decreased scanning to L visual field Visual Fields: Left visual field deficits Confrontation testing: pt unable to identify stimulus in Left visual field with or without stimulus on Right Line bisection: pt correctly bisecting 4/6 lines, verbally stating where is the end of the line and  turning head to scan full paper in 2 instances.  Patient has difficulty with following activities due to following visual impairments: reading from medical list as pt frequently omitting words on far left  PERCEPTION: Impaired: Inattention/neglect: does not attend to left visual field  OBSERVATIONS: Pt requiring verbal cues for visual attention and scanning to Left while navigating through treatment space as pt initially not able to see treatment room on Left side of gym.                                                                                                                             TREATMENT DATE:  07/02/24 Visual attention: engaged in matching cards on vertical surface to challenge attention and arousal.  OT initiating task with pt reading out cards on vertical surface, pt able to complete from L to R without any cues.  OT then moving target further to Left with pt still demonstrating improvements in visual scanning to L with vertical surface.  OT holding one card at a time for pt to match to vertical card board, progressing to field of 9 to complete in ascending order with good attention and ability to locate on L field.  OT then placing 9 cards to pt's left and 9 on pt's right, challenging pt to scan to R and L to match cards to card board.  Pt demonstrating increased difficulty with larger area, initially omitting card on far L.  Word search: engaged in horizontal word search with OT providing with anchor on Left to increase visual attention and scanning to left side of paper.  Pt demonstrating initial good attention to task, locating initial 3 words prior to then starting to nod off. Pt unable to find 4th word, despite cues to scan to Left.  OT sent paper home with pt to attempt to complete prior to next visit.      06/27/24 Trail making: Completed Trail making A in 5:40.  OT providing red line to left to facilitate increased scanning to left visual field.   Pt requiring increased  time but with good recall to scan and turn head to left when not locating number without any additional cue from therapist.  Pt demonstrating initial good attention to task, however nodding off after number 21 and requiring cues to arouse and attend to task. Visual attention: engaged in pattern replication with small pegs to address visual scanning and visual  attention.  Pattern and peg board placed at midline and pegs placed on R, with cues to place L hand on L to encourage scanning to L hand each time to facilitate visual attention to hole board/pattern.  OT adding placemat under pegboard to provide contrast color to increase attention to L side of board and then incorporating moderate cues to attend to L side. Pt completing R side of pattern, however requiring increased time and verbal cues to scan to far left, bottom corner of pegboard when placing pegs left of midline.     06/25/24 Jigsaw puzzle: OT setting up puzzle on red place mat to provide visual board to allow pt to locate items on far Left.  OT also encouraging pt to place L hand on table to scan to hand as additional visual anchor.  Pt demonstrating max difficulty with scanning to L and nodding off when OT providing with increased time for sequencing, therefore terminated task to engage in more engaging task.   Matching cards: utilized educational psychologist cards with matching pair in left visual field.  Pt able to complete with increased time and verbal cues to maintain awake and engaged in task.  OT transitioned to deck of cards, challenging pt to sort them in numerical order - therefore needing to scan to L periodically to locate matching cards in L visual field. Visual scanning: engaged in scanning letters, naming underlined letters aloud.  Pt completing 5 lines with use of bookmark for lines 4 and 5 to increase visual attention to task.  Pt omitting 3/14 letters when scanning.    PATIENT EDUCATION: Education details: visual scanning and Left  anchor during table top tasks Person educated: Patient and Spouse Education method: Explanation, Verbal cues, and Handouts Education comprehension: verbalized understanding and needs further education  HOME EXERCISE PROGRAM: Visual spatial awareness - see pt instructions   GOALS: Goals reviewed with patient? Yes  SHORT TERM GOALS: Target date: 07/05/24  Pt and spouse with verbalize and demonstrate understanding of visual compensatory strategies. Baseline: Goal status: in progress  2.  Pt will be independent in visual exercises to increase awareness and vision in L visual field.  Baseline:  Goal status: in progress  3.  Pt will complete table top scanning activity with min cues for technique. Baseline:  Goal status: in progress  4.  Pt will verbalize understanding of safe tub/shower transfers. Baseline:  Goal status: in progress   LONG TERM GOALS: Target date: 07/26/24  Pt will navigate a moderately busy environment, following multi-step commands with 90% accuracy Baseline:  Goal status: in progress  2.  Pt will complete table top scanning activity with Supervision in standing for increased visual attention and standing tolerance. Baseline:  Goal status: in progress  3.  Pt will demonstrate improved fine motor coordination for ADLs and visual attention to Left visual field as evidenced by decreasing 9 hole peg test score for BUE by 5 secs on RUE and 10 seconds on LUE.  Baseline: Right: 47.44 sec; Left: 58.57 sec Goal status: in progress  4.  Pt will verbalize and/or demonstrate understanding of energy conservation strategies to increase engagement in and safety with ADLs and IADLs.  Baseline:  Goal status: in progress    ASSESSMENT:  CLINICAL IMPRESSION: Patient is a 89 y.o. male who was seen today for occupational therapy treatment for visual deficits s/p CVA. Pt demonstrating impairments of L visual field and inattention.  OT positioning self to pt's left at mat  table and when ambulating  to increase visual scanning to left and provide buffer during ambulation.  Pt demonstrating improved arousal and engagement in session initially with more dynamic activity.  Pt nodding off once transitioned to pen and paper task.  Pt demonstrating improvements in visual attention with more dynamic card matching activity, however continuing to benefit from verbal cues and anchor to scan to L during table top tasks.    PERFORMANCE DEFICITS: in functional skills including ADLs, IADLs, Fine motor control, balance, body mechanics, endurance, decreased knowledge of precautions, decreased knowledge of use of DME, and vision, cognitive skills including memory and safety awareness, and psychosocial skills including coping strategies, environmental adaptation, and routines and behaviors.     PLAN:  OT FREQUENCY: 2x/week for 3 weeks, then 1x/week for additional 3 weeks  OT DURATION: 6 weeks  PLANNED INTERVENTIONS: 97168 OT Re-evaluation, 97535 self care/ADL training, 02889 therapeutic exercise, 97530 therapeutic activity, 97112 neuromuscular re-education, functional mobility training, visual/perceptual remediation/compensation, psychosocial skills training, energy conservation, coping strategies training, patient/family education, and DME and/or AE instructions  RECOMMENDED OTHER SERVICES: NA  CONSULTED AND AGREED WITH PLAN OF CARE: Patient and family member/caregiver  PLAN FOR NEXT SESSION: complete peg board pattern replication with small pegs, educate on visual scanning during table top and with mobility.  Use of vertical surfaces for increased arousal, Incorporate mobility with scavenger hunt type tasks for arousal and participation    KAYLENE DOMINO, OTR/L 07/02/2024, 3:37 PM   [facility] at [facility] [address] [place], [place] [address] Phone # [phone] Fax # [phone]

## 2024-07-03 DIAGNOSIS — N186 End stage renal disease: Secondary | ICD-10-CM | POA: Diagnosis not present

## 2024-07-04 ENCOUNTER — Ambulatory Visit: Admitting: Occupational Therapy

## 2024-07-04 ENCOUNTER — Ambulatory Visit

## 2024-07-04 DIAGNOSIS — R29818 Other symptoms and signs involving the nervous system: Secondary | ICD-10-CM

## 2024-07-04 DIAGNOSIS — R2681 Unsteadiness on feet: Secondary | ICD-10-CM | POA: Diagnosis not present

## 2024-07-04 DIAGNOSIS — R262 Difficulty in walking, not elsewhere classified: Secondary | ICD-10-CM

## 2024-07-04 DIAGNOSIS — M6281 Muscle weakness (generalized): Secondary | ICD-10-CM

## 2024-07-04 DIAGNOSIS — R41842 Visuospatial deficit: Secondary | ICD-10-CM

## 2024-07-04 NOTE — Therapy (Signed)
 OUTPATIENT OCCUPATIONAL THERAPY NEURO  Treatment Note  Patient Name: Austin [name] MRN: [id number] DOB:1935-01-24, 89 y.o., male Today's Date: 07/04/2024  PCP: Merna Huxley, NP REFERRING PROVIDER: Merna Huxley, NP  END OF SESSION:        Past Medical History:  Diagnosis Date   Acute diastolic CHF (congestive heart failure) (HCC) 09/17/2020   Acute exacerbation of CHF (congestive heart failure) (HCC) 08/04/2019   Acute right PCA stroke (HCC) 04/25/2024   ANEMIA DUE TO CHRONIC BLOOD LOSS 03/13/2007   CAROTID ARTERY STENOSIS 05/10/2010   Coronary artery disease    DIABETES MELLITUS, TYPE II 09/19/2007   DISEASE, CEREBROVASCULAR NEC 03/05/2007   ESRD (end stage renal disease) on dialysis (HCC)    GERD 03/13/2007   HYPERLIPIDEMIA 03/05/2007   HYPERTENSION 03/05/2007   HYPOKALEMIA 11/09/2009   KNEE PAIN, RIGHT 11/09/2009   New onset a-fib (HCC) 04/27/2024   Eliquis    PROSTATE CANCER, HX OF 03/05/2007   Past Surgical History:  Procedure Laterality Date   A/V FISTULAGRAM N/A 03/21/2024   Procedure: A/V Fistulagram;  Surgeon: Magda Debby SAILOR, MD;  Location: [facility];  Service: Cardiovascular;  Laterality: N/A;   AV FISTULA PLACEMENT Left 10/18/2022   Procedure: INSERTION OF LEFT ARM BRACHIAL ARTERY TO AXILLARY VEIN ARTERIOVENOUS (AV) GORE-TEX GRAFT;  Surgeon: Lanis Fonda BRAVO, MD;  Location: [facility];  Service: Vascular;  Laterality: Left;   CAROTID ARTERY ANGIOPLASTY Right Oct. 10, 2001   ESOPHAGOGASTRODUODENOSCOPY (EGD) WITH PROPOFOL  N/A 11/04/2016   Procedure: ESOPHAGOGASTRODUODENOSCOPY (EGD) WITH PROPOFOL ;  Surgeon: Layla Lah, MD;  Location: [facility];  Service: Gastroenterology;  Laterality: N/A;   IR FLUORO GUIDE CV LINE RIGHT  10/25/2022   IR FLUORO GUIDE CV LINE RIGHT  10/27/2022   IR US  GUIDE VASC ACCESS RIGHT  10/25/2022   IR US  GUIDE VASC ACCESS RIGHT  10/27/2022   LAPAROSCOPIC APPENDECTOMY  02/20/2012   Procedure: APPENDECTOMY LAPAROSCOPIC;  Surgeon: Jina Nephew, MD;  Location: [facility] OR;  Service: General;  Laterality: N/A;   PROSTATE SURGERY     prostatectomy   Patient Active Problem List   Diagnosis Date Noted   New onset atrial fibrillation (HCC) 04/27/2024   Acute CVA (cerebrovascular accident) (HCC) 04/25/2024   Lumbar facet arthropathy 04/16/2024   Secondary hyperparathyroidism of renal origin 06/02/2023   Preoperative evaluation of a medical condition to rule out surgical contraindications (TAR required) 05/04/2023   Right carpal tunnel syndrome 03/16/2023   Coagulation defect, unspecified 11/07/2022   Personal history of nicotine dependence 11/01/2022   ESRD on dialysis (HCC) 10/29/2022   Protein-calorie malnutrition, severe 10/28/2022   Anemia in chronic kidney disease 10/28/2022   Hyperlipidemia, unspecified 10/28/2022   Pneumonia due to COVID-19 virus 10/25/2022   CKD (chronic kidney disease) stage 5, GFR less than 15 ml/min (HCC) 10/25/2022   Severe pulmonary hypertension (HCC) 10/25/2022   Moderate aortic regurgitation 10/25/2022   CAD in native artery 10/25/2022   UTI (urinary tract infection) 09/27/2021   CHF exacerbation (HCC) 09/26/2021   Acute respiratory failure with hypoxia (HCC) 09/26/2021   Acute on chronic diastolic CHF (congestive heart failure) (HCC) 07/09/2021   Dyspnea 06/01/2021   Upper airway cough syndrome 04/06/2021   Porokeratosis 03/26/2021   Pulmonary nodules 10/22/2020   Acute on chronic renal failure 10/09/2020   Mass of right lung 09/18/2020   Acute kidney injury superimposed on CKD 08/04/2019   Elevated troponin 08/04/2019   Hypokalemia 08/04/2019   Hypertensive urgency 03/28/2019   Elevated serum immunoglobulin free light chains 02/20/2019  Gastrointestinal hemorrhage with melena    Melena 11/03/2016   Carotid artery stenosis 05/10/2010   Type 2 diabetes mellitus with hyperlipidemia (HCC) 09/19/2007   Iron  deficiency anemia due to chronic blood loss 03/13/2007   GERD 03/13/2007    Essential hypertension 03/05/2007   DISEASE, CEREBROVASCULAR NEC 03/05/2007   PROSTATE CANCER, HX OF 03/05/2007    ONSET DATE: referral date 05/24/24  REFERRING DIAG: H53.9 (ICD-10-CM) - Visual disturbance  THERAPY DIAG:  No diagnosis found.  Rationale for Evaluation and Treatment: Rehabilitation  SUBJECTIVE:   SUBJECTIVE STATEMENT: Pt reports that he feels like he got better sleep over the weekend.  Pt accompanied by: self  PERTINENT HISTORY: Admitted to [facility] on 9/4 with visual changes over the last 4 days. MRI reveals acute R PCA infarct and remote R basal ganglia infarct with evidence of prior hemorrhage.   H/o: CHF, carotid artery stenosis, CAD, DMII, HLD, HTN, prostate CA, L AV fistula Dialysis: M, W, F  PRECAUTIONS: Fall and Other: L AV fistula- no L UE BP   WEIGHT BEARING RESTRICTIONS: No  PAIN:  Are you having pain? No  FALLS: Has patient fallen in last 6 months? No  LIVING ENVIRONMENT: Lives with: lives with their spouse Lives in: House/apartment Stairs: 3-4 steps to enter with railing; 2 story home with bedroom/full bath on the second floor Has following equipment at home: Vannie - 2 wheeled, Environmental Consultant - 4 wheeled, Wheelchair (manual), and quad tip cane  PLOF: Independent with basic ADLs and wife was assisting with shower transfers, cooking, cleaning   PATIENT GOALS: strategies to aid in vision; more energy to do things  OBJECTIVE:  Note: Objective measures were completed at Evaluation unless otherwise noted.  HAND DOMINANCE: Right  ADLs: Transfers/ambulation related to ADLs: using quad tip cane and cues for L attention Eating: Mod I Grooming: some difficulty with shaving, but I manage UB Dressing: Mod I LB Dressing: Mod I Toileting: Mod I Bathing: occasional assist with washing back Tub Shower transfers: very difficult to get out of garden tub  IADLs: Shopping: wife completes Light housekeeping: wife completes Meal Prep: would make breakfast  prior to stroke, but has not since Community mobility: not driving Medication management: spouse administers meds, has used a pill box in the past Financial management: wife completes  MOBILITY STATUS: utilizing quad tip cane   POSTURE COMMENTS:  rounded shoulders, forward head, and shoulders elevations   ACTIVITY TOLERANCE: Activity tolerance: reports decreased activity tolerance  UPPER EXTREMITY ROM:  WFL bilaterally   UPPER EXTREMITY MMT:   4+5 overall   COORDINATION: 9 Hole Peg test: Right: 47.44 sec; Left: 58.57 sec - pt picking up additional pegs to place due to decreased visual attention and looking for additional holes  SENSATION: WFL  COGNITION: Overall cognitive status: spouse reports that he gets confused more since the stroke than before  VISION: Subjective report: decreased attention to L Baseline vision: Bifocals and Wears glasses all the time Visual history: cataracts, had those removed  VISION ASSESSMENT: Gaze preference/alignment: WDL Tracking/Visual pursuits: Decreased smoothness with horizontal tracking Saccades: undershoots, decreased speed of saccadic movements, and decreased scanning to L visual field Visual Fields: Left visual field deficits Confrontation testing: pt unable to identify stimulus in Left visual field with or without stimulus on Right Line bisection: pt correctly bisecting 4/6 lines, verbally stating where is the end of the line and turning head to scan full paper in 2 instances.  Patient has difficulty with following activities due to following visual impairments:  reading from medical list as pt frequently omitting words on far left  PERCEPTION: Impaired: Inattention/neglect: does not attend to left visual field  OBSERVATIONS: Pt requiring verbal cues for visual attention and scanning to Left while navigating through treatment space as pt initially not able to see treatment room on Left side of gym.                                                                                                                              TREATMENT DATE:  07/04/24 Visual scanning: pt brought in word search from previous session.  Pt locating 27 of 36 words, therefore OT identifying the missing ones.  Pt locating one during session, but encouraged to locate the remaining words at home. Visual attention/scanning: OT placing shape cards in left visual field and pattern placed at midline.  Pt to scan to L to locate matching cards to copy pattern.  Pt requiring auditory cues to keep aroused and tapping to Left side of table to facilitate scanning to L for shape cards.  Pt requiring frequent scanning to R <> L to identify and then locate matching cards to pattern with external cues from therapist. Scavenger hunt: pt ambulating throughout treatment gym to locate red cones.  Pt correctly locating 4 of 5 cones and only missing 5th cone due to obscured by another patient nearby.  Pt demonstrating improved visual scanning with mobility compared to seated.     07/02/24 Visual attention: engaged in matching cards on vertical surface to challenge attention and arousal.  OT initiating task with pt reading out cards on vertical surface, pt able to complete from L to R without any cues.  OT then moving target further to Left with pt still demonstrating improvements in visual scanning to L with vertical surface.  OT holding one card at a time for pt to match to vertical card board, progressing to field of 9 to complete in ascending order with good attention and ability to locate on L field.  OT then placing 9 cards to pt's left and 9 on pt's right, challenging pt to scan to R and L to match cards to card board.  Pt demonstrating increased difficulty with larger area, initially omitting card on far L.  Word search: engaged in horizontal word search with OT providing with anchor on Left to increase visual attention and scanning to left side of paper.  Pt  demonstrating initial good attention to task, locating initial 3 words prior to then starting to nod off. Pt unable to find 4th word, despite cues to scan to Left.  OT sent paper home with pt to attempt to complete prior to next visit.      06/27/24 Trail making: Completed Trail making A in 5:40.  OT providing red line to left to facilitate increased scanning to left visual field.   Pt requiring increased time but with good recall to scan and turn head to left when not  locating number without any additional cue from therapist.  Pt demonstrating initial good attention to task, however nodding off after number 21 and requiring cues to arouse and attend to task. Visual attention: engaged in pattern replication with small pegs to address visual scanning and visual attention.  Pattern and peg board placed at midline and pegs placed on R, with cues to place L hand on L to encourage scanning to L hand each time to facilitate visual attention to hole board/pattern.  OT adding placemat under pegboard to provide contrast color to increase attention to L side of board and then incorporating moderate cues to attend to L side. Pt completing R side of pattern, however requiring increased time and verbal cues to scan to far left, bottom corner of pegboard when placing pegs left of midline.    PATIENT EDUCATION: Education details: visual scanning and Left anchor during table top tasks Person educated: Patient and Spouse Education method: Explanation, Verbal cues, and Handouts Education comprehension: verbalized understanding and needs further education  HOME EXERCISE PROGRAM: Visual spatial awareness - see pt instructions   GOALS: Goals reviewed with patient? Yes  SHORT TERM GOALS: Target date: 07/05/24  Pt and spouse with verbalize and demonstrate understanding of visual compensatory strategies. Baseline: Goal status: in progress  2.  Pt will be independent in visual exercises to increase awareness  and vision in L visual field.  Baseline:  Goal status: in progress  3.  Pt will complete table top scanning activity with min cues for technique. Baseline:  Goal status: in progress  4.  Pt will verbalize understanding of safe tub/shower transfers. Baseline:  Goal status: in progress   LONG TERM GOALS: Target date: 07/26/24  Pt will navigate a moderately busy environment, following multi-step commands with 90% accuracy Baseline:  Goal status: in progress  2.  Pt will complete table top scanning activity with Supervision in standing for increased visual attention and standing tolerance. Baseline:  Goal status: in progress  3.  Pt will demonstrate improved fine motor coordination for ADLs and visual attention to Left visual field as evidenced by decreasing 9 hole peg test score for BUE by 5 secs on RUE and 10 seconds on LUE.  Baseline: Right: 47.44 sec; Left: 58.57 sec Goal status: in progress  4.  Pt will verbalize and/or demonstrate understanding of energy conservation strategies to increase engagement in and safety with ADLs and IADLs.  Baseline:  Goal status: in progress    ASSESSMENT:  CLINICAL IMPRESSION: Patient is a 89 y.o. male who was seen today for occupational therapy treatment for visual deficits s/p CVA. Pt demonstrating impairments of L visual field and inattention greater during seated tasks compared to during dynamic mobility.  OT positioning self to pt's left at mat table and when ambulating to increase visual scanning to left and provide buffer during ambulation.  Pt demonstrating improved arousal and engagement in session with dynamic mobility and with dynamic activities.    PERFORMANCE DEFICITS: in functional skills including ADLs, IADLs, Fine motor control, balance, body mechanics, endurance, decreased knowledge of precautions, decreased knowledge of use of DME, and vision, cognitive skills including memory and safety awareness, and psychosocial skills  including coping strategies, environmental adaptation, and routines and behaviors.     PLAN:  OT FREQUENCY: 2x/week for 3 weeks, then 1x/week for additional 3 weeks  OT DURATION: 6 weeks  PLANNED INTERVENTIONS: 97168 OT Re-evaluation, 97535 self care/ADL training, 02889 therapeutic exercise, 97530 therapeutic activity, 97112 neuromuscular re-education, functional mobility training,  visual/perceptual remediation/compensation, psychosocial skills training, energy conservation, coping strategies training, patient/family education, and DME and/or AE instructions  RECOMMENDED OTHER SERVICES: NA  CONSULTED AND AGREED WITH PLAN OF CARE: Patient and family member/caregiver  PLAN FOR NEXT SESSION: complete peg board pattern replication with small pegs, educate on visual scanning during table top and with mobility.  Use of vertical surfaces for increased arousal, Incorporate mobility with scavenger hunt type tasks for arousal and participation    KAYLENE DOMINO, OTR/L 07/04/2024, 2:50 PM   [facility] at [facility] [address], [address] [place], [place] [address] Phone # [phone] Fax # [phone]

## 2024-07-04 NOTE — Therapy (Signed)
 OUTPATIENT PHYSICAL THERAPY NEURO TREATMENT   Patient Name: Austin [name] MRN: [id number] DOB:1934/08/29, 89 y.o., male Today's Date: 07/04/2024   PCP: Merna Huxley, NP  REFERRING PROVIDER: Merna Huxley, NP   END OF SESSION:  PT End of Session - 07/04/24 1357     Visit Number 8    Number of Visits 13    Date for Recertification  07/18/24    Authorization Type [employer]    Authorization Time Period 05/23/2024-07/18/2024    Authorization - Visit Number 8    Authorization - Number of Visits 13    PT Start Time 1400    PT Stop Time 1445    PT Time Calculation (min) 45 min    Equipment Utilized During Treatment Gait belt    Activity Tolerance Patient tolerated treatment well    Behavior During Therapy WFL for tasks assessed/performed           Past Medical History:  Diagnosis Date   Acute diastolic CHF (congestive heart failure) (HCC) 09/17/2020   Acute exacerbation of CHF (congestive heart failure) (HCC) 08/04/2019   Acute right PCA stroke (HCC) 04/25/2024   ANEMIA DUE TO CHRONIC BLOOD LOSS 03/13/2007   CAROTID ARTERY STENOSIS 05/10/2010   Coronary artery disease    DIABETES MELLITUS, TYPE II 09/19/2007   DISEASE, CEREBROVASCULAR NEC 03/05/2007   ESRD (end stage renal disease) on dialysis (HCC)    GERD 03/13/2007   HYPERLIPIDEMIA 03/05/2007   HYPERTENSION 03/05/2007   HYPOKALEMIA 11/09/2009   KNEE PAIN, RIGHT 11/09/2009   New onset a-fib (HCC) 04/27/2024   Eliquis    PROSTATE CANCER, HX OF 03/05/2007   Past Surgical History:  Procedure Laterality Date   A/V FISTULAGRAM N/A 03/21/2024   Procedure: A/V Fistulagram;  Surgeon: Magda Debby SAILOR, MD;  Location: [facility];  Service: Cardiovascular;  Laterality: N/A;   AV FISTULA PLACEMENT Left 10/18/2022   Procedure: INSERTION OF LEFT ARM BRACHIAL ARTERY TO AXILLARY VEIN ARTERIOVENOUS (AV) GORE-TEX GRAFT;  Surgeon: Lanis Fonda BRAVO, MD;  Location: [facility] OR;  Service: Vascular;  Laterality: Left;   CAROTID ARTERY  ANGIOPLASTY Right Oct. 10, 2001   ESOPHAGOGASTRODUODENOSCOPY (EGD) WITH PROPOFOL  N/A 11/04/2016   Procedure: ESOPHAGOGASTRODUODENOSCOPY (EGD) WITH PROPOFOL ;  Surgeon: Layla Lah, MD;  Location: [facility];  Service: Gastroenterology;  Laterality: N/A;   IR FLUORO GUIDE CV LINE RIGHT  10/25/2022   IR FLUORO GUIDE CV LINE RIGHT  10/27/2022   IR US  GUIDE VASC ACCESS RIGHT  10/25/2022   IR US  GUIDE VASC ACCESS RIGHT  10/27/2022   LAPAROSCOPIC APPENDECTOMY  02/20/2012   Procedure: APPENDECTOMY LAPAROSCOPIC;  Surgeon: Jina Nephew, MD;  Location: [facility] OR;  Service: General;  Laterality: N/A;   PROSTATE SURGERY     prostatectomy   Patient Active Problem List   Diagnosis Date Noted   New onset atrial fibrillation (HCC) 04/27/2024   Acute CVA (cerebrovascular accident) (HCC) 04/25/2024   Lumbar facet arthropathy 04/16/2024   Secondary hyperparathyroidism of renal origin 06/02/2023   Preoperative evaluation of a medical condition to rule out surgical contraindications (TAR required) 05/04/2023   Right carpal tunnel syndrome 03/16/2023   Coagulation defect, unspecified 11/07/2022   Personal history of nicotine dependence 11/01/2022   ESRD on dialysis (HCC) 10/29/2022   Protein-calorie malnutrition, severe 10/28/2022   Anemia in chronic kidney disease 10/28/2022   Hyperlipidemia, unspecified 10/28/2022   Pneumonia due to COVID-19 virus 10/25/2022   CKD (chronic kidney disease) stage 5, GFR less than 15 ml/min (HCC) 10/25/2022   Severe pulmonary  hypertension (HCC) 10/25/2022   Moderate aortic regurgitation 10/25/2022   CAD in native artery 10/25/2022   UTI (urinary tract infection) 09/27/2021   CHF exacerbation (HCC) 09/26/2021   Acute respiratory failure with hypoxia (HCC) 09/26/2021   Acute on chronic diastolic CHF (congestive heart failure) (HCC) 07/09/2021   Dyspnea 06/01/2021   Upper airway cough syndrome 04/06/2021   Porokeratosis 03/26/2021   Pulmonary nodules 10/22/2020   Acute on  chronic renal failure 10/09/2020   Mass of right lung 09/18/2020   Acute kidney injury superimposed on CKD 08/04/2019   Elevated troponin 08/04/2019   Hypokalemia 08/04/2019   Hypertensive urgency 03/28/2019   Elevated serum immunoglobulin free light chains 02/20/2019   Gastrointestinal hemorrhage with melena    Melena 11/03/2016   Carotid artery stenosis 05/10/2010   Type 2 diabetes mellitus with hyperlipidemia (HCC) 09/19/2007   Iron  deficiency anemia due to chronic blood loss 03/13/2007   GERD 03/13/2007   Essential hypertension 03/05/2007   DISEASE, CEREBROVASCULAR NEC 03/05/2007   PROSTATE CANCER, HX OF 03/05/2007    ONSET DATE: 04/25/24  REFERRING DIAG: I63.9 (ICD-10-CM) - Acute CVA (cerebrovascular accident) (HCC) R26.81 (ICD-10-CM) - Gait instability  THERAPY DIAG:  Other symptoms and signs involving the nervous system  Unsteadiness on feet  Muscle weakness (generalized)  Difficulty in walking, not elsewhere classified  Rationale for Evaluation and Treatment: Rehabilitation  SUBJECTIVE:                                                                                                                                                                                             SUBJECTIVE STATEMENT: Doing well. Feeling pretty good.    Pt accompanied by: significant other and in waiting room  PERTINENT HISTORY: CHF, carotid artery stenosis, CAD, DMII, HLD, HTN, prostate CA, L AV fistula Dialysis: M, W, F  PAIN:  Are you having pain? No  PRECAUTIONS: Fall and Other: L AV fistula- no L UE BP   RED FLAGS: None   WEIGHT BEARING RESTRICTIONS: No  FALLS: Has patient fallen in last 6 months? No  LIVING ENVIRONMENT: Lives with: lives with their spouse Lives in: House/apartment Stairs: 3-4 steps to enter with railing; 2 story home Has following equipment at home: Vannie - 2 wheeled, Environmental Consultant - 4 wheeled, Wheelchair (manual), and quad tip cane  PLOF: Independent with  basic ADLs and wife was assisting with shower transfers, cooking, cleaning   PATIENT GOALS: maybe a little balance  OBJECTIVE:   TODAY'S TREATMENT: 07/04/24 Activity Comments  NU-step level 5 x 6 min For pre-gait reciprocal motion  Sidestep x 2 min 3# for single limb support  Alt cone taps x 2 min 3# for step height  Forward/backward step over hurdles x 2 min 3#; BUE support, single UE, no UE support  Static multisensory balance   :  1) 210 ft w/ 4WW 2) 180 ft w/ 5TT         HOME EXERCISE PROGRAM: Access Code: [email or web address] URL: [email or web address] Date: 06/13/2024 Prepared by: [facility] - Outpatient  Rehab - [facility]  Exercises - Heel Toe Raises with Counter Support  - 1 x daily - 4 x weekly - 2 sets - 10 reps - March in Place  - 1 x daily - 4 x weekly - 2 sets - 10 reps - Side Stepping with Counter Support  - 1 x daily - 4 x weekly - 1 sets - 3 reps - Standing Foot Lift on Box (BKA)  - 1 x daily - 7 x weekly - 1-3 sets - 10 reps -------------------------------------------------------- Note: Objective measures were completed at Evaluation unless otherwise noted.  DIAGNOSTIC FINDINGS:  04/25/24 brain MRI: Acute infarct in the right PCA territory as above. 2. 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 3. Additional remote lacunar infarcts in the left thalamus. 4. Focus of signal abnormality and susceptibility within the left parietal lobe, possibly representing remote hemorrhage or vascular malformation such as cavernous malformation. Additional similar focus of signal abnormality and susceptibility along the right dorsal aspect of the pons.  COGNITION: Overall cognitive status: difficult to discern; requires increased instruction for tasks occasionally    SENSATION: Pt reports N/T in UEs  COORDINATION: Alternating pronation/supination: slight dysmetria B Alternating toe tap: hypokinesia L>R Finger to nose:  WNL B  MUSCLE TONE: WNL B LE  POSTURE: rounded shoulders, forward head, and shoulders elevations  LOWER EXTREMITY ROM:     Active  Right Eval Left Eval  Hip flexion    Hip extension    Hip abduction    Hip adduction    Hip internal rotation    Hip external rotation    Knee flexion    Knee extension    Ankle dorsiflexion 8 16  Ankle plantarflexion    Ankle inversion    Ankle eversion     (Blank rows = not tested)  LOWER EXTREMITY MMT:    MMT (in sitting) Right Eval Left Eval  Hip flexion 4+ 4+  Hip extension    Hip abduction 4- 4-  Hip adduction 4- 4-  Hip internal rotation    Hip external rotation    Knee flexion 3+ 3+  Knee extension 4+ 4  Ankle dorsiflexion 4 4  Ankle plantarflexion 4 4  Ankle inversion    Ankle eversion    (Blank rows = not tested)  GAIT: Findings: Assistive device utilized:quad tip cane, Level of assistance: SBA and CGA, and Comments: limited foot clearance B and tendency to shuffle feet; slowed *pt requires increased direction to navigate clinic environment; suspect vision vs. Cognition as a factor   FUNCTIONAL TESTS:  5 times sit to stand: 23.8 sec pushing off LEs without standing fully  10 meter walk test: 17.23 sec with cane (1.9 ft/sec)  TREATMENT DATE: 05/23/24    PATIENT EDUCATION: Education details: edu on benefits of OT for vision, prognosis, POC, exam findings as they relate for functional impairments  Person educated: Patient and Spouse Education method: Explanation Education comprehension: verbalized understanding  HOME EXERCISE PROGRAM: Not yet initiated    GOALS: Goals reviewed with patient? Yes  SHORT TERM GOALS: Target date: 06/13/2024  Patient to be independent with initial HEP. Baseline: HEP initiated;pt requires cueing Goal status: partially MET    LONG TERM GOALS: Target  date: 07/18/2024  Patient to be independent with advanced HEP. Baseline: Not yet initiated  Goal status: IN PROGRESS  to be improve to at least 400 ft for improved gait efficiency and endurance. Baseline: 365 ft Goal status: IN PROGRESS  Patient to score at least 15/24 on DGI in order to decrease risk of falls.  Baseline: 7 Goal status: IN PROGRESS  Patient to demonstrate 5xSTS test in <15 sec in order to decrease risk of falls.  Baseline: 23 sec Goal status: IN PROGRESS  Patient to demonstrate gait speed of at least 2.3 ft/sec in order to improve access to community.  Baseline: 1.9 ft/sec Goal status: IN PROGRESS   ASSESSMENT:  CLINICAL IMPRESSION: Activities to facilitate single limb support and stride length with deliberate practice and use of visual aids on ground to facilitate with emphasis on reducing UE support to progress single limb control.  Proceeded with training w/ 4WW level surfaces to carryover stride length and speed with decreased performance from onset of fatigue. Continued sessions to progress POC details to improve mobility  OBJECTIVE IMPAIRMENTS: Abnormal gait, decreased activity tolerance, decreased balance, decreased coordination, decreased endurance, difficulty walking, decreased ROM, decreased strength, decreased safety awareness, impaired flexibility, and postural dysfunction.   ACTIVITY LIMITATIONS: carrying, lifting, bending, sitting, standing, squatting, stairs, transfers, bed mobility, bathing, toileting, dressing, reach over head, hygiene/grooming, and locomotion level  PARTICIPATION LIMITATIONS: meal prep, cleaning, shopping, community activity, and church  PERSONAL FACTORS: Age, Fitness, Past/current experiences, Time since onset of injury/illness/exacerbation, and 3+ comorbidities: CHF, carotid artery stenosis, CAD, DMII, HLD, HTN, prostate CA, L AV fistula are also affecting patient's functional outcome.   REHAB POTENTIAL:  Good  CLINICAL DECISION MAKING: Evolving/moderate complexity  EVALUATION COMPLEXITY: Moderate  PLAN:  PT FREQUENCY: 1-2x/week  PT DURATION: 6 weeks  PLANNED INTERVENTIONS: [id number]- PT Re-evaluation, 97750- Physical Performance Testing, 97110-Therapeutic exercises, 97530- Therapeutic activity, [id number]- Neuromuscular re-education, 97535- Self Care, [id number]- Manual therapy, [id number]- Gait training, [phone]- Canalith repositioning, Patient/Family education, Balance training, Stair training, Taping, Vestibular training, DME instructions, Cryotherapy, and Moist heat  PLAN FOR NEXT SESSION: Review HEP for BLE strength and balance and update HEP as appropriate. Faster walking w/ rollator (uses one on dialysis days)    1:58 PM, 07/04/24 M. Kelly Tiwanna Tuch, PT, DPT Physical Therapist- [place] Office Number: [phone]

## 2024-07-05 ENCOUNTER — Other Ambulatory Visit (HOSPITAL_COMMUNITY)

## 2024-07-08 ENCOUNTER — Telehealth: Payer: Self-pay | Admitting: Adult Health

## 2024-07-08 ENCOUNTER — Encounter (HOSPITAL_COMMUNITY): Payer: Self-pay | Admitting: Vascular Surgery

## 2024-07-08 NOTE — Telephone Encounter (Unsigned)
 Copied from CRM #[id number]. Topic: Clinical - Medication Refill >> Jul 08, 2024  5:39 PM Alfonso HERO wrote: Medication: lactulose  (CHRONULAC ) 10 GM/15ML solution  Has the patient contacted their pharmacy? Yes (Agent: If no, request that the patient contact the pharmacy for the refill. If patient does not wish to contact the pharmacy document the reason why and proceed with request.) (Agent: If yes, when and what did the pharmacy advise?)  This is the patient's preferred pharmacy:  [id number] - [place], [place] - [address] AT [address] OF [facility] [address] [place] [address] Phone: [phone] Fax: [phone]   Is this the correct pharmacy for this prescription? Yes If no, delete pharmacy and type the correct one.   Has the prescription been filled recently? Yes  Is the patient out of the medication? Yes  Has the patient been seen for an appointment in the last year OR does the patient have an upcoming appointment? Yes  Can we respond through MyChart? Yes  Agent: Please be advised that Rx refills may take up to 3 business days. We ask that you follow-up with your pharmacy.

## 2024-07-09 ENCOUNTER — Encounter (HOSPITAL_COMMUNITY): Payer: Self-pay

## 2024-07-09 ENCOUNTER — Ambulatory Visit

## 2024-07-09 ENCOUNTER — Ambulatory Visit: Admitting: Occupational Therapy

## 2024-07-09 MED ORDER — LACTULOSE 10 GM/15ML PO SOLN: ORAL | 3 refills | Status: DC

## 2024-07-09 NOTE — Therapy (Deleted)
 OUTPATIENT OCCUPATIONAL THERAPY NEURO  Treatment Note  Patient Name: Austin [name] MRN: [id number] DOB:[birth year], 89 y.o., male Today's Date: 07/09/2024  PCP: Merna Huxley, NP REFERRING PROVIDER: Merna Huxley, NP  END OF SESSION:        Past Medical History:  Diagnosis Date   Acute diastolic CHF (congestive heart failure) (HCC) 09/17/2020   Acute exacerbation of CHF (congestive heart failure) (HCC) 08/04/2019   Acute right PCA stroke (HCC) 04/25/2024   ANEMIA DUE TO CHRONIC BLOOD LOSS 03/13/2007   CAROTID ARTERY STENOSIS 05/10/2010   Coronary artery disease    DIABETES MELLITUS, TYPE II 09/19/2007   DISEASE, CEREBROVASCULAR NEC 03/05/2007   ESRD (end stage renal disease) on dialysis (HCC)    GERD 03/13/2007   HYPERLIPIDEMIA 03/05/2007   HYPERTENSION 03/05/2007   HYPOKALEMIA 11/09/2009   KNEE PAIN, RIGHT 11/09/2009   New onset a-fib (HCC) 04/27/2024   Eliquis    PROSTATE CANCER, HX OF 03/05/2007   Past Surgical History:  Procedure Laterality Date   A/V FISTULAGRAM N/A 03/21/2024   Procedure: A/V Fistulagram;  Surgeon: Magda Debby SAILOR, MD;  Location: [facility];  Service: Cardiovascular;  Laterality: N/A;   AV FISTULA PLACEMENT Left 10/18/2022   Procedure: INSERTION OF LEFT ARM BRACHIAL ARTERY TO AXILLARY VEIN ARTERIOVENOUS (AV) GORE-TEX GRAFT;  Surgeon: Lanis Fonda BRAVO, MD;  Location: [facility];  Service: Vascular;  Laterality: Left;   CAROTID ARTERY ANGIOPLASTY Right 05/31/2000   ESOPHAGOGASTRODUODENOSCOPY (EGD) WITH PROPOFOL  N/A 11/04/2016   Procedure: ESOPHAGOGASTRODUODENOSCOPY (EGD) WITH PROPOFOL ;  Surgeon: Layla Lah, MD;  Location: [facility];  Service: Gastroenterology;  Laterality: N/A;   IR FLUORO GUIDE CV LINE RIGHT  10/25/2022   IR FLUORO GUIDE CV LINE RIGHT  10/27/2022   IR US  GUIDE VASC ACCESS RIGHT  10/25/2022   IR US  GUIDE VASC ACCESS RIGHT  10/27/2022   LAPAROSCOPIC APPENDECTOMY  02/20/2012   Procedure: APPENDECTOMY LAPAROSCOPIC;  Surgeon:  Jina Nephew, MD;  Location: [facility] OR;  Service: General;  Laterality: N/A;   PROSTATE SURGERY     prostatectomy   Patient Active Problem List   Diagnosis Date Noted   New onset atrial fibrillation (HCC) 04/27/2024   Acute CVA (cerebrovascular accident) (HCC) 04/25/2024   Lumbar facet arthropathy 04/16/2024   Secondary hyperparathyroidism of renal origin 06/02/2023   Preoperative evaluation of a medical condition to rule out surgical contraindications (TAR required) 05/04/2023   Right carpal tunnel syndrome 03/16/2023   Coagulation defect, unspecified 11/07/2022   Personal history of nicotine dependence 11/01/2022   ESRD on dialysis (HCC) 10/29/2022   Protein-calorie malnutrition, severe 10/28/2022   Anemia in chronic kidney disease 10/28/2022   Hyperlipidemia, unspecified 10/28/2022   Pneumonia due to COVID-19 virus 10/25/2022   CKD (chronic kidney disease) stage 5, GFR less than 15 ml/min (HCC) 10/25/2022   Severe pulmonary hypertension (HCC) 10/25/2022   Moderate aortic regurgitation 10/25/2022   CAD in native artery 10/25/2022   UTI (urinary tract infection) 09/27/2021   CHF exacerbation (HCC) 09/26/2021   Acute respiratory failure with hypoxia (HCC) 09/26/2021   Acute on chronic diastolic CHF (congestive heart failure) (HCC) 07/09/2021   Dyspnea 06/01/2021   Upper airway cough syndrome 04/06/2021   Porokeratosis 03/26/2021   Pulmonary nodules 10/22/2020   Acute on chronic renal failure 10/09/2020   Mass of right lung 09/18/2020   Acute kidney injury superimposed on CKD 08/04/2019   Elevated troponin 08/04/2019   Hypokalemia 08/04/2019   Hypertensive urgency 03/28/2019   Elevated serum immunoglobulin free light chains 02/20/2019  Gastrointestinal hemorrhage with melena    Melena 11/03/2016   Carotid artery stenosis 05/10/2010   Type 2 diabetes mellitus with hyperlipidemia (HCC) 09/19/2007   Iron  deficiency anemia due to chronic blood loss 03/13/2007   GERD 03/13/2007    Essential hypertension 03/05/2007   DISEASE, CEREBROVASCULAR NEC 03/05/2007   PROSTATE CANCER, HX OF 03/05/2007    ONSET DATE: referral date 05/24/24  REFERRING DIAG: H53.9 (ICD-10-CM) - Visual disturbance  THERAPY DIAG:  No diagnosis found.  Rationale for Evaluation and Treatment: Rehabilitation  SUBJECTIVE:   SUBJECTIVE STATEMENT: Pt reports that he feels like he got better sleep over the weekend.  Pt accompanied by: self  PERTINENT HISTORY: Admitted to [facility] on 9/4 with visual changes over the last 4 days. MRI reveals acute R PCA infarct and remote R basal ganglia infarct with evidence of prior hemorrhage.   H/o: CHF, carotid artery stenosis, CAD, DMII, HLD, HTN, prostate CA, L AV fistula Dialysis: M, W, F  PRECAUTIONS: Fall and Other: L AV fistula- no L UE BP   WEIGHT BEARING RESTRICTIONS: No  PAIN:  Are you having pain? No  FALLS: Has patient fallen in last 6 months? No  LIVING ENVIRONMENT: Lives with: lives with their spouse Lives in: House/apartment Stairs: 3-4 steps to enter with railing; 2 story home with bedroom/full bath on the second floor Has following equipment at home: Vannie - 2 wheeled, Environmental Consultant - 4 wheeled, Wheelchair (manual), and quad tip cane  PLOF: Independent with basic ADLs and wife was assisting with shower transfers, cooking, cleaning   PATIENT GOALS: strategies to aid in vision; more energy to do things  OBJECTIVE:  Note: Objective measures were completed at Evaluation unless otherwise noted.  HAND DOMINANCE: Right  ADLs: Transfers/ambulation related to ADLs: using quad tip cane and cues for L attention Eating: Mod I Grooming: some difficulty with shaving, but I manage UB Dressing: Mod I LB Dressing: Mod I Toileting: Mod I Bathing: occasional assist with washing back Tub Shower transfers: very difficult to get out of garden tub  IADLs: Shopping: wife completes Light housekeeping: wife completes Meal Prep: would make breakfast  prior to stroke, but has not since Community mobility: not driving Medication management: spouse administers meds, has used a pill box in the past Financial management: wife completes  MOBILITY STATUS: utilizing quad tip cane   POSTURE COMMENTS:  rounded shoulders, forward head, and shoulders elevations   ACTIVITY TOLERANCE: Activity tolerance: reports decreased activity tolerance  UPPER EXTREMITY ROM:  WFL bilaterally   UPPER EXTREMITY MMT:   4+5 overall   COORDINATION: 9 Hole Peg test: Right: 47.44 sec; Left: 58.57 sec - pt picking up additional pegs to place due to decreased visual attention and looking for additional holes  SENSATION: WFL  COGNITION: Overall cognitive status: spouse reports that he gets confused more since the stroke than before  VISION: Subjective report: decreased attention to L Baseline vision: Bifocals and Wears glasses all the time Visual history: cataracts, had those removed  VISION ASSESSMENT: Gaze preference/alignment: WDL Tracking/Visual pursuits: Decreased smoothness with horizontal tracking Saccades: undershoots, decreased speed of saccadic movements, and decreased scanning to L visual field Visual Fields: Left visual field deficits Confrontation testing: pt unable to identify stimulus in Left visual field with or without stimulus on Right Line bisection: pt correctly bisecting 4/6 lines, verbally stating where is the end of the line and turning head to scan full paper in 2 instances.  Patient has difficulty with following activities due to following visual impairments:  reading from medical list as pt frequently omitting words on far left  PERCEPTION: Impaired: Inattention/neglect: does not attend to left visual field  OBSERVATIONS: Pt requiring verbal cues for visual attention and scanning to Left while navigating through treatment space as pt initially not able to see treatment room on Left side of gym.                                                                                                                              TREATMENT DATE:  07/09/24 Use of vertical surfaces for increased arousal - peg board pattern on back of mirror Incorporate mobility with scavenger hunt type tasks for arousal and participation   07/04/24 Visual scanning: pt brought in word search from previous session.  Pt locating 27 of 36 words, therefore OT identifying the missing ones.  Pt locating one during session, but encouraged to locate the remaining words at home. Visual attention/scanning: OT placing shape cards in left visual field and pattern placed at midline.  Pt to scan to L to locate matching cards to copy pattern.  Pt requiring auditory cues to keep aroused and tapping to Left side of table to facilitate scanning to L for shape cards.  Pt requiring frequent scanning to R <> L to identify and then locate matching cards to pattern with external cues from therapist. Scavenger hunt: pt ambulating throughout treatment gym to locate red cones.  Pt correctly locating 4 of 5 cones and only missing 5th cone due to obscured by another patient nearby.  Pt demonstrating improved visual scanning with mobility compared to seated.     07/02/24 Visual attention: engaged in matching cards on vertical surface to challenge attention and arousal.  OT initiating task with pt reading out cards on vertical surface, pt able to complete from L to R without any cues.  OT then moving target further to Left with pt still demonstrating improvements in visual scanning to L with vertical surface.  OT holding one card at a time for pt to match to vertical card board, progressing to field of 9 to complete in ascending order with good attention and ability to locate on L field.  OT then placing 9 cards to pt's left and 9 on pt's right, challenging pt to scan to R and L to match cards to card board.  Pt demonstrating increased difficulty with larger area, initially  omitting card on far L.  Word search: engaged in horizontal word search with OT providing with anchor on Left to increase visual attention and scanning to left side of paper.  Pt demonstrating initial good attention to task, locating initial 3 words prior to then starting to nod off. Pt unable to find 4th word, despite cues to scan to Left.  OT sent paper home with pt to attempt to complete prior to next visit.      06/27/24 Trail making: Completed Trail making A in 5:40.  OT providing red line  to left to facilitate increased scanning to left visual field.   Pt requiring increased time but with good recall to scan and turn head to left when not locating number without any additional cue from therapist.  Pt demonstrating initial good attention to task, however nodding off after number 21 and requiring cues to arouse and attend to task. Visual attention: engaged in pattern replication with small pegs to address visual scanning and visual attention.  Pattern and peg board placed at midline and pegs placed on R, with cues to place L hand on L to encourage scanning to L hand each time to facilitate visual attention to hole board/pattern.  OT adding placemat under pegboard to provide contrast color to increase attention to L side of board and then incorporating moderate cues to attend to L side. Pt completing R side of pattern, however requiring increased time and verbal cues to scan to far left, bottom corner of pegboard when placing pegs left of midline.    PATIENT EDUCATION: Education details: visual scanning and Left anchor during table top tasks Person educated: Patient and Spouse Education method: Explanation, Verbal cues, and Handouts Education comprehension: verbalized understanding and needs further education  HOME EXERCISE PROGRAM: Visual spatial awareness - see pt instructions   GOALS: Goals reviewed with patient? Yes  SHORT TERM GOALS: Target date: 07/05/24  Pt and spouse with  verbalize and demonstrate understanding of visual compensatory strategies. Baseline: Goal status: in progress  2.  Pt will be independent in visual exercises to increase awareness and vision in L visual field.  Baseline:  Goal status: in progress  3.  Pt will complete table top scanning activity with min cues for technique. Baseline:  Goal status: in progress  4.  Pt will verbalize understanding of safe tub/shower transfers. Baseline:  Goal status: in progress   LONG TERM GOALS: Target date: 07/26/24  Pt will navigate a moderately busy environment, following multi-step commands with 90% accuracy Baseline:  Goal status: in progress  2.  Pt will complete table top scanning activity with Supervision in standing for increased visual attention and standing tolerance. Baseline:  Goal status: in progress  3.  Pt will demonstrate improved fine motor coordination for ADLs and visual attention to Left visual field as evidenced by decreasing 9 hole peg test score for BUE by 5 secs on RUE and 10 seconds on LUE.  Baseline: Right: 47.44 sec; Left: 58.57 sec Goal status: in progress  4.  Pt will verbalize and/or demonstrate understanding of energy conservation strategies to increase engagement in and safety with ADLs and IADLs.  Baseline:  Goal status: in progress    ASSESSMENT:  CLINICAL IMPRESSION: Patient is a 89 y.o. male who was seen today for occupational therapy treatment for visual deficits s/p CVA. Pt demonstrating impairments of L visual field and inattention greater during seated tasks compared to during dynamic mobility.  OT positioning self to pt's left at mat table and when ambulating to increase visual scanning to left and provide buffer during ambulation.  Pt demonstrating improved arousal and engagement in session with dynamic mobility and with dynamic activities.    PERFORMANCE DEFICITS: in functional skills including ADLs, IADLs, Fine motor control, balance, body  mechanics, endurance, decreased knowledge of precautions, decreased knowledge of use of DME, and vision, cognitive skills including memory and safety awareness, and psychosocial skills including coping strategies, environmental adaptation, and routines and behaviors.     PLAN:  OT FREQUENCY: 2x/week for 3 weeks, then 1x/week for additional  3 weeks  OT DURATION: 6 weeks  PLANNED INTERVENTIONS: 97168 OT Re-evaluation, 97535 self care/ADL training, 02889 therapeutic exercise, 97530 therapeutic activity, 97112 neuromuscular re-education, functional mobility training, visual/perceptual remediation/compensation, psychosocial skills training, energy conservation, coping strategies training, patient/family education, and DME and/or AE instructions  RECOMMENDED OTHER SERVICES: NA  CONSULTED AND AGREED WITH PLAN OF CARE: Patient and family member/caregiver  PLAN FOR NEXT SESSION: complete peg board pattern replication with small pegs, educate on visual scanning during table top and with mobility.  Use of vertical surfaces for increased arousal, Incorporate mobility with scavenger hunt type tasks for arousal and participation    KAYLENE DOMINO, OTR/L 07/09/2024, 1:35 PM   [facility] at [facility] [address] [place], [place] [address] Phone # [phone] Fax # [phone]

## 2024-07-10 ENCOUNTER — Other Ambulatory Visit: Payer: Self-pay

## 2024-07-10 ENCOUNTER — Encounter (HOSPITAL_COMMUNITY): Payer: Self-pay | Admitting: Emergency Medicine

## 2024-07-10 ENCOUNTER — Emergency Department (HOSPITAL_COMMUNITY)

## 2024-07-10 ENCOUNTER — Emergency Department (HOSPITAL_COMMUNITY)
Admission: EM | Admit: 2024-07-10 | Discharge: 2024-07-11 | Disposition: A | Discharge status: Home / Self Care | Attending: Emergency Medicine | Admitting: Emergency Medicine

## 2024-07-10 DIAGNOSIS — R944 Abnormal results of kidney function studies: Secondary | ICD-10-CM | POA: Insufficient documentation

## 2024-07-10 DIAGNOSIS — R519 Headache, unspecified: Secondary | ICD-10-CM | POA: Diagnosis not present

## 2024-07-10 DIAGNOSIS — Z7901 Long term (current) use of anticoagulants: Secondary | ICD-10-CM | POA: Diagnosis not present

## 2024-07-10 DIAGNOSIS — E1122 Type 2 diabetes mellitus with diabetic chronic kidney disease: Secondary | ICD-10-CM | POA: Insufficient documentation

## 2024-07-10 DIAGNOSIS — D631 Anemia in chronic kidney disease: Secondary | ICD-10-CM | POA: Insufficient documentation

## 2024-07-10 DIAGNOSIS — I132 Hypertensive heart and chronic kidney disease with heart failure and with stage 5 chronic kidney disease, or end stage renal disease: Secondary | ICD-10-CM | POA: Diagnosis not present

## 2024-07-10 DIAGNOSIS — I120 Hypertensive chronic kidney disease with stage 5 chronic kidney disease or end stage renal disease: Secondary | ICD-10-CM | POA: Diagnosis not present

## 2024-07-10 DIAGNOSIS — Z992 Dependence on renal dialysis: Secondary | ICD-10-CM | POA: Insufficient documentation

## 2024-07-10 DIAGNOSIS — R918 Other nonspecific abnormal finding of lung field: Secondary | ICD-10-CM | POA: Diagnosis present

## 2024-07-10 DIAGNOSIS — N189 Chronic kidney disease, unspecified: Secondary | ICD-10-CM

## 2024-07-10 DIAGNOSIS — Z8673 Personal history of transient ischemic attack (TIA), and cerebral infarction without residual deficits: Secondary | ICD-10-CM | POA: Diagnosis not present

## 2024-07-10 DIAGNOSIS — Z8546 Personal history of malignant neoplasm of prostate: Secondary | ICD-10-CM | POA: Insufficient documentation

## 2024-07-10 DIAGNOSIS — I2510 Atherosclerotic heart disease of native coronary artery without angina pectoris: Secondary | ICD-10-CM | POA: Insufficient documentation

## 2024-07-10 DIAGNOSIS — I5030 Unspecified diastolic (congestive) heart failure: Secondary | ICD-10-CM | POA: Diagnosis not present

## 2024-07-10 DIAGNOSIS — R790 Abnormal level of blood mineral: Secondary | ICD-10-CM | POA: Diagnosis not present

## 2024-07-10 DIAGNOSIS — D72829 Elevated white blood cell count, unspecified: Secondary | ICD-10-CM | POA: Diagnosis not present

## 2024-07-10 DIAGNOSIS — I4891 Unspecified atrial fibrillation: Secondary | ICD-10-CM | POA: Insufficient documentation

## 2024-07-10 DIAGNOSIS — Z79899 Other long term (current) drug therapy: Secondary | ICD-10-CM | POA: Insufficient documentation

## 2024-07-10 DIAGNOSIS — N186 End stage renal disease: Secondary | ICD-10-CM | POA: Insufficient documentation

## 2024-07-10 LAB — BASIC METABOLIC PANEL WITH GFR
Anion gap: 19 — ABNORMAL HIGH (ref 5–15)
BUN: 25 mg/dL — ABNORMAL HIGH (ref 8–23)
CO2: 23 mmol/L (ref 22–32)
Calcium: 9.6 mg/dL (ref 8.9–10.3)
Chloride: 95 mmol/L — ABNORMAL LOW (ref 98–111)
Creatinine, Ser: 5.98 mg/dL — ABNORMAL HIGH (ref 0.61–1.24)
GFR, Estimated: 9 mL/min — ABNORMAL LOW (ref >60)
Glucose, Bld: 163 mg/dL — ABNORMAL HIGH (ref 70–99)
Potassium: 3.8 mmol/L (ref 3.5–5.1)
Sodium: 137 mmol/L (ref 135–145)

## 2024-07-10 LAB — CBC WITH DIFFERENTIAL/PLATELET
Abs Immature Granulocytes: 0.04 K/uL (ref 0.00–0.07)
Basophils Absolute: 0.1 K/uL (ref 0.0–0.1)
Basophils Relative: 1 %
Eosinophils Absolute: 0.2 K/uL (ref 0.0–0.5)
Eosinophils Relative: 1 %
HCT: 39.9 % (ref 39.0–52.0)
Hemoglobin: 12.3 g/dL — ABNORMAL LOW (ref 13.0–17.0)
Immature Granulocytes: 0 %
Lymphocytes Relative: 8 %
Lymphs Abs: 0.8 K/uL (ref 0.7–4.0)
MCH: 26.7 pg (ref 26.0–34.0)
MCHC: 30.8 g/dL (ref 30.0–36.0)
MCV: 86.7 fL (ref 80.0–100.0)
Monocytes Absolute: 1 K/uL (ref 0.1–1.0)
Monocytes Relative: 10 %
Neutro Abs: 8.8 K/uL — ABNORMAL HIGH (ref 1.7–7.7)
Neutrophils Relative %: 80 %
Platelets: 306 K/uL (ref 150–400)
RBC: 4.6 MIL/uL (ref 4.22–5.81)
RDW: 20.2 % — ABNORMAL HIGH (ref 11.5–15.5)
WBC: 10.9 K/uL — ABNORMAL HIGH (ref 4.0–10.5)
nRBC: 0 % (ref 0.0–0.2)

## 2024-07-10 MED ORDER — PROCHLORPERAZINE EDISYLATE 10 MG/2ML IJ SOLN
10.0000 mg | Freq: Once | INTRAMUSCULAR | Status: AC
  Administered 2024-07-10: 10 mg via INTRAVENOUS
  Filled 2024-07-10: qty 2

## 2024-07-10 NOTE — ED Triage Notes (Signed)
 Patient here with severe headache that started at noon today. Endorses also having a toothache on the R side as well. Pain is R right ear to the back of the neck. Denies visual changes, nausea, fevers. States he had a stroke a few weeks ago in which he had left sided visual deficits, but his visual has returned. He is on Eliquis  now.

## 2024-07-10 NOTE — ED Triage Notes (Signed)
 Patient reports headache 8/10 all day that feels like burning through his right ear. Patient endorses hypertension but takes his meds.

## 2024-07-10 NOTE — ED Provider Triage Note (Signed)
 Emergency Medicine Provider Triage Evaluation Note  Zamauri Nez , a 89 y.o. male  was evaluated in triage.  Pt complains of severe headache, recent CVA.  On Eliquis .  Headache on right ear behind, notes dental pain on same side  Review of Systems  Positive: Headache, right ear pain, right tooth pain Negative: Numbness, weakness, diplopia, tinnitus, nausea, vomiting  Physical Exam  BP (!) 184/71 (BP Location: Right Arm)   Pulse 87   Temp 98.4 F (36.9 C)   Resp 16   SpO2 97%  Gen:   Awake, no distress   Resp:  Normal effort  MSK:   Moves extremities without difficulty  Other:    Medical Decision Making  Medically screening exam initiated at 8:52 PM.  Appropriate orders placed.  Telford Archambeau was informed that the remainder of the evaluation will be completed by another provider, this initial triage assessment does not replace that evaluation, and the importance of remaining in the ED until their evaluation is complete.  Labs and imaging ordered   Francis Ileana LOISE DEVONNA 07/10/24 2055

## 2024-07-10 NOTE — ED Provider Notes (Signed)
 [place] EMERGENCY DEPARTMENT AT [facility] Provider Note   CSN: [id number] Arrival date & time: 07/10/24  2036     Patient presents with: Headache   Austin [name] is a 89 y.o. male.  {Add pertinent medical, surgical, social history, OB history to YEP:67052} The history is provided by the patient.  Headache  He has history of hypertension, diabetes, hyperlipidemia, prostate cancer, diastolic heart failure, coronary artery disease, end-stage renal disease on hemodialysis, stroke, atrial fibrillation anticoagulated on apixaban  and comes in complaining of right-sided headache for the last 3 days.  Pain seems to be focused around his ear and going into the angle of his jaw and somewhat into his neck.  He denies any difficulty hearing and denies any visual change.  He denies any fever or chills and denies any nausea or vomiting.  There has been no numbness or weakness.  He has taken acetaminophen  which has given slight relief.  Last dialysis was earlier today.    Prior to Admission medications   Medication Sig Start Date End Date Taking? Authorizing Provider  Accu-Chek Softclix Lancets lancets USED TO CHECK BLOOD GLUCOSE TWICE A DAY OR AS NEEDED 06/08/23   Nafziger, Darleene, NP  acetaminophen  (TYLENOL ) 500 MG tablet Take 1,000 mg by mouth every 4 (four) hours as needed.    [provider]  albuterol  (VENTOLIN  HFA) 108 (90 Base) MCG/ACT inhaler Inhale 2 puffs into the lungs every 6 (six) hours as needed for wheezing or shortness of breath. 06/11/24   Nafziger, Darleene, NP  amLODipine  (NORVASC ) 10 MG tablet TAKE 1 TABLET BY MOUTH EVERY DAY 12/05/23   Nafziger, Darleene, NP  amoxicillin  (AMOXIL ) 875 MG tablet Take 875 mg by mouth 2 (two) times daily. 06/22/24   [provider]  apixaban  (ELIQUIS ) 2.5 MG TABS tablet Take 1 tablet (2.5 mg total) by mouth 2 (two) times daily. 04/27/24   Arlice Reichert, MD  atorvastatin  (LIPITOR) 20 MG tablet TAKE 1 TABLET BY MOUTH EVERY DAY 03/26/24    Nafziger, Darleene, NP  cetirizine  (ZYRTEC ) 5 MG tablet Take 1 tablet (5 mg total) by mouth at bedtime. 03/15/24   Rising, Asberry, PA-C  cholecalciferol  (VITAMIN D3) 25 MCG (1000 UNIT) tablet Take 1,000 Units by mouth daily.    [provider]  cloNIDine  (CATAPRES ) 0.2 MG tablet Take 1 tablet (0.2 mg total) by mouth daily. 05/02/24   Tolia, Sunit, DO  doxazosin  (CARDURA ) 2 MG tablet Take 2 mg by mouth at bedtime.  12/28/17   [provider]  esomeprazole  (NEXIUM ) 40 MG capsule TAKE 1 CAPSULE BY MOUTH EVERY DAY 03/07/24   Nafziger, Darleene, NP  fluticasone  (FLONASE ) 50 MCG/ACT nasal spray Place 1 spray into both nostrils daily. 03/15/24   Rising, Rebecca, PA-C  glucose blood (ACCU-CHEK GUIDE) test strip USE TO CHECK BLOOD GLUCOSE TWICE A DAY OR AS NEEDED 04/27/23   Nafziger, Darleene, NP  guaiFENesin  (MUCINEX ) 600 MG 12 hr tablet Take 600 mg by mouth at bedtime.    [provider]  isosorbide -hydrALAZINE  (BIDIL ) 20-37.5 MG tablet Take 1 tablet by mouth 3 (three) times daily. 04/27/24   Arlice Reichert, MD  ketoconazole  (NIZORAL ) 2 % cream Apply 1 Application topically 2 (two) times daily. 04/04/24   McDonald, Juliene SAUNDERS, DPM  lactulose  (CHRONULAC ) 10 GM/15ML solution TAKE 30 ML BY MOUTH TWICE DAILY AS NEEDED 07/09/24   Nafziger, Darleene, NP  Melatonin 10 MG TABS Take 10 mg by mouth at bedtime.    [provider]  Methoxy PEG-Epoetin   Beta (MIRCERA IJ) Mircera 10/30/23 10/28/24  [provider]  methylcellulose (CITRUCEL) oral powder 1 tablespoon every 2-3 days,alternating with lactulose     [provider]  Multiple Vitamins-Minerals (PRESERVISION AREDS 2) CAPS Take 1 capsule by mouth daily. 01/29/24   [provider]  Propylene Glycol (SYSTANE BALANCE OP) Place 1 drop into both eyes at bedtime.    [provider]  sevelamer  carbonate (RENVELA ) 800 MG tablet Take 800 mg by mouth 3 (three) times daily. 12/08/22   [provider]    Allergies: Aspirin      Review of Systems  Neurological:  Positive for headaches.  All other systems reviewed and are negative.   Updated Vital Signs BP (!) 152/53 (BP Location: Right Arm)   Pulse 84   Temp 99.3 F (37.4 C) (Temporal)   Resp 16   SpO2 92%   Physical Exam Vitals and nursing note reviewed.   89 year old male, resting comfortably and in no acute distress. Vital signs are significant for elevated blood pressure. Oxygen  saturation is 92%, which is normal. Head is normocephalic and atraumatic. PERRLA, EOMI. Oropharynx is clear.  Tympanic membranes are clear. Neck is nontender and supple without adenopathy. Lungs are clear without rales, wheezes, or rhonchi. Chest is nontender. Heart has regular rate and rhythm with 2/6 harsh systolic ejection murmur best heard at the cardiac base. Abdomen is soft, flat, nontender. Extremities have no cyanosis or edema, full range of motion is present.  AV fistula is present in the left upper arm with thrill present. Skin is warm and dry without rash. Neurologic: Mental status is normal, cranial nerves are intact, strength is 5/5 in all 4 extremities.  (all labs ordered are listed, but only abnormal results are displayed) Labs Reviewed  BASIC METABOLIC PANEL WITH GFR - Abnormal; Notable for the following components:      Result Value   Chloride 95 (*)    Glucose, Bld 163 (*)    BUN 25 (*)    Creatinine, Ser 5.98 (*)    GFR, Estimated 9 (*)    Anion gap 19 (*)    All other components within normal limits  CBC WITH DIFFERENTIAL/PLATELET - Abnormal; Notable for the following components:   WBC 10.9 (*)    Hemoglobin 12.3 (*)    RDW 20.2 (*)    Neutro Abs 8.8 (*)    All other components within normal limits    Radiology: CT Head Wo Contrast Result Date: 07/10/2024 EXAM: CT HEAD WITHOUT CONTRAST 07/10/2024 09:24:00 PM TECHNIQUE: CT of the head was performed without the administration of intravenous contrast. Automated exposure control, iterative  reconstruction, and/or weight based adjustment of the mA/kV was utilized to reduce the radiation dose to as low as reasonably achievable. COMPARISON: 05/05/2024 CLINICAL HISTORY: Headache, sudden, severe. FINDINGS: BRAIN AND VENTRICLES: No acute hemorrhage. No evidence of acute infarct. No hydrocephalus. No extra-axial collection. No mass effect or midline shift. Old right basal ganglia lacunar infarcts. Additional small remote lacunar infarct in the left thalamus again noted. There is hypoattenuation in the right occipital lobe which has increased from prior concerning for subacute or chronic infarct. This focus of infarct is adjacent to areas of smaller remote cortical infarcts seen on the prior studies. Atrophy and chronic small vessel disease changes. ORBITS: No acute abnormality. SINUSES: Complete opacification of right maxillary sinus. Additional mucosal thickening in the right ethmoid sinus more pronounced anteriorly. Mucosal thickening in the right frontal sinus. SOFT TISSUES AND SKULL: No acute soft tissue  abnormality. No skull fracture. IMPRESSION: 1. Hypoattenuation in the right occipital lobe, increased from prior, concerning for a subacute or chronic infarct. Recommend MRI for further evaluation. 2. Atrophy and chronic small vessel disease changes. Remote lacunar infarcts. 3. Similar paranasal sinus disease. Electronically signed by: Donnice Mania MD 07/10/2024 09:53 PM EST RP Workstation: [id number]    {Document cardiac monitor, telemetry assessment procedure when appropriate:32947} Procedures   Medications Ordered in the ED - No data to display    {Click here for ABCD2, HEART and other calculators REFRESH Note before signing:1}                              Medical Decision Making  Right-sided headache which actually seems to be focused in the ear, benign exam.  I have reviewed his laboratory tests, and my interpretation is elevated BUN and creatinine consistent with known history of  end-stage renal disease, mild anemia which is improved over baseline, mild leukocytosis which is nonspecific.  CT of head shows hypoattenuation in the right occipital lobe increased from prior concerning for subacute or chronic infarct with recommendation for MRI.  Clinically, I doubt that this is causing his headache, but I have ordered MRI.  I am wondering whether this actually might be early trigeminal neuralgia or early Bell's palsy both of which can have pain which is centered in the ear.  I have ordered a trial of prochlorperazine  for headache.  I have reviewed his past records and note ED visit on 05/05/2024 for headache which improved following a metoclopramide .  {Document critical care time when appropriate  Document review of labs and clinical decision tools ie CHADS2VASC2, etc  Document your independent review of radiology images and any outside records  Document your discussion with family members, caretakers and with consultants  Document social determinants of health affecting pt's care  Document your decision making why or why not admission, treatments were needed:32947:::1}   Final diagnoses:  None    ED Discharge Orders     None

## 2024-07-11 ENCOUNTER — Ambulatory Visit (INDEPENDENT_AMBULATORY_CARE_PROVIDER_SITE_OTHER): Admitting: Podiatry

## 2024-07-11 ENCOUNTER — Emergency Department (HOSPITAL_COMMUNITY)

## 2024-07-11 ENCOUNTER — Encounter: Payer: Self-pay | Admitting: Podiatry

## 2024-07-11 ENCOUNTER — Encounter (HOSPITAL_COMMUNITY)
Admission: RE | Admit: 2024-07-11 | Discharge: 2024-07-11 | Disposition: A | Discharge status: Home / Self Care | Source: Ambulatory Visit | Attending: Primary Care | Admitting: Primary Care

## 2024-07-11 ENCOUNTER — Ambulatory Visit

## 2024-07-11 DIAGNOSIS — E1151 Type 2 diabetes mellitus with diabetic peripheral angiopathy without gangrene: Secondary | ICD-10-CM

## 2024-07-11 DIAGNOSIS — R911 Solitary pulmonary nodule: Secondary | ICD-10-CM | POA: Diagnosis not present

## 2024-07-11 DIAGNOSIS — I70209 Unspecified atherosclerosis of native arteries of extremities, unspecified extremity: Secondary | ICD-10-CM

## 2024-07-11 DIAGNOSIS — G319 Degenerative disease of nervous system, unspecified: Secondary | ICD-10-CM | POA: Diagnosis not present

## 2024-07-11 DIAGNOSIS — B351 Tinea unguium: Secondary | ICD-10-CM | POA: Diagnosis not present

## 2024-07-11 DIAGNOSIS — R918 Other nonspecific abnormal finding of lung field: Secondary | ICD-10-CM | POA: Insufficient documentation

## 2024-07-11 DIAGNOSIS — M79675 Pain in left toe(s): Secondary | ICD-10-CM | POA: Diagnosis not present

## 2024-07-11 DIAGNOSIS — I6381 Other cerebral infarction due to occlusion or stenosis of small artery: Secondary | ICD-10-CM | POA: Diagnosis not present

## 2024-07-11 DIAGNOSIS — M79674 Pain in right toe(s): Secondary | ICD-10-CM

## 2024-07-11 DIAGNOSIS — R29818 Other symptoms and signs involving the nervous system: Secondary | ICD-10-CM | POA: Diagnosis not present

## 2024-07-11 LAB — GLUCOSE, CAPILLARY: Glucose-Capillary: 172 mg/dL — ABNORMAL HIGH (ref 70–99)

## 2024-07-11 MED ORDER — FLUDEOXYGLUCOSE F - 18 (FDG) INJECTION
8.7500 | Freq: Once | INTRAVENOUS | Status: AC
  Administered 2024-07-11: 8.65 via INTRAVENOUS

## 2024-07-11 MED ORDER — DEXAMETHASONE SOD PHOSPHATE PF 10 MG/ML IJ SOLN
10.0000 mg | Freq: Once | INTRAMUSCULAR | Status: AC
  Administered 2024-07-11: 10 mg via INTRAVENOUS

## 2024-07-11 NOTE — ED Notes (Signed)
 Patient transported to MRI

## 2024-07-11 NOTE — Progress Notes (Signed)
 This patient returns to my office for at risk foot care.  This patient requires this care by a professional since this patient will be at risk due to having diabetic neuropathy and CKD   This patient is unable to cut nails himself since the patient cannot reach his nails.These nails are painful walking and wearing shoes.  This patient presents for at risk foot care today.  General Appearance  Alert, conversant and in no acute stress.  Vascular  Dorsalis pedis and posterior tibial  pulses are weakly  palpable  bilaterally.  Capillary return is within normal limits  bilaterally. Temperature is within normal limits  bilaterally.  Neurologic  Senn-Weinstein monofilament wire test diminished bilaterally. Muscle power within normal limits bilaterally.  Nails Thick disfigured discolored nails with subungual debris  from hallux to fifth toes bilaterally. No evidence of bacterial infection or drainage bilaterally.  Orthopedic  No limitations of motion  feet .  No crepitus or effusions noted.  No bony pathology or digital deformities noted.  HAV  B/L  Hammer toes  B/L.  Skin  normotropic skin noted bilaterally.  No signs of infections or ulcers noted.   Asymptomatic callus sub 3,4 and 5 left foot.  Onychomycosis  Pain in right toes  Pain in left toes    Consent was obtained for treatment procedures.   Mechanical debridement of nails 1-5  bilaterally performed with a nail nipper.  Filed with dremel without incident. Callus were trimmed with a dremel tool as a courtesy.  RTC 10 weeks.   Return office visit    10 weeks                 Told patient to return for periodic foot care and evaluation due to potential at risk complications.   Cordella Bold DPM

## 2024-07-11 NOTE — Discharge Instructions (Addendum)
 If your headache comes back, you may take acetaminophen  as needed for pain.  If your headache persists or starts getting worse, you might benefit from evaluation by a neurologist.

## 2024-07-12 ENCOUNTER — Other Ambulatory Visit: Payer: Self-pay | Admitting: Adult Health

## 2024-07-12 NOTE — Telephone Encounter (Signed)
 Copied from CRM [id number]. Topic: Clinical - Medication Refill >> Jul 12, 2024 11:33 AM Charolett L wrote: Medication: doxazosin  (CARDURA ) 2 MG tablet  Has the patient contacted their pharmacy? Yes (Agent: If no, request that the patient contact the pharmacy for the refill. If patient does not wish to contact the pharmacy document the reason why and proceed with request.) (Agent: If yes, when and what did the pharmacy advise?)  This is the patient's preferred pharmacy:  [id number] - [place], [place] - [address] AT CORNER OF [facility] [address] [place] [address] Phone: [phone] Fax: [phone]   Is this the correct pharmacy for this prescription? Yes If no, delete pharmacy and type the correct one.   Has the prescription been filled recently? Yes  Is the patient out of the medication? Yes  Has the patient been seen for an appointment in the last year OR does the patient have an upcoming appointment? Yes  Can we respond through MyChart? Yes  Agent: Please be advised that Rx refills may take up to 3 business days. We ask that you follow-up with your pharmacy.

## 2024-07-12 NOTE — Telephone Encounter (Signed)
 Pt spouse notified that this will need to come from cardiology.

## 2024-07-14 DIAGNOSIS — N186 End stage renal disease: Secondary | ICD-10-CM | POA: Diagnosis not present

## 2024-07-15 ENCOUNTER — Encounter (HOSPITAL_COMMUNITY)
Admission: RE | Disposition: A | Discharge status: Home / Self Care | Payer: Self-pay | Source: Home / Self Care | Attending: Vascular Surgery

## 2024-07-15 ENCOUNTER — Other Ambulatory Visit: Payer: Self-pay

## 2024-07-15 ENCOUNTER — Ambulatory Visit (HOSPITAL_COMMUNITY)
Admission: RE | Admit: 2024-07-15 | Discharge: 2024-07-15 | Disposition: A | Discharge status: Home / Self Care | Attending: Vascular Surgery | Admitting: Vascular Surgery

## 2024-07-15 LAB — GLUCOSE, CAPILLARY: Glucose-Capillary: 139 mg/dL — ABNORMAL HIGH (ref 70–99)

## 2024-07-15 SURGERY — A/V FISTULAGRAM
Anesthesia: LOCAL | Site: Arm Upper | Laterality: Left

## 2024-07-15 NOTE — H&P (Signed)
 Hospital Consult    Reason for Consult: Left AV graft pain Requesting Physician: Dr. Dolan MRN #:  [id number]  History of Present Illness: This is a 89 y.o. male with end-stage renal disease currently being dialyzed through a left arm AV graft placed in 2024.  Per Richardean the graft has been functioning well.  No low flow, no elevated pressure, no prolonged bleeding.  Deaven simply states that after he is on dialysis for a period of time, he has burning at the access sites where his needles were placed.  Sometimes they repositioned and this helps.  Describes this as burning.  He notes no symptoms in the hand.  Past Medical History:  Diagnosis Date   Acute diastolic CHF (congestive heart failure) (HCC) 09/17/2020   Acute exacerbation of CHF (congestive heart failure) (HCC) 08/04/2019   Acute right PCA stroke (HCC) 04/25/2024   ANEMIA DUE TO CHRONIC BLOOD LOSS 03/13/2007   CAROTID ARTERY STENOSIS 05/10/2010   Coronary artery disease    DIABETES MELLITUS, TYPE II 09/19/2007   DISEASE, CEREBROVASCULAR NEC 03/05/2007   ESRD (end stage renal disease) on dialysis (HCC)    GERD 03/13/2007   HYPERLIPIDEMIA 03/05/2007   HYPERTENSION 03/05/2007   HYPOKALEMIA 11/09/2009   KNEE PAIN, RIGHT 11/09/2009   New onset a-fib (HCC) 04/27/2024   Eliquis    PROSTATE CANCER, HX OF 03/05/2007    Past Surgical History:  Procedure Laterality Date   A/V FISTULAGRAM N/A 03/21/2024   Procedure: A/V Fistulagram;  Surgeon: Magda Debby SAILOR, MD;  Location: [facility];  Service: Cardiovascular;  Laterality: N/A;   AV FISTULA PLACEMENT Left 10/18/2022   Procedure: INSERTION OF LEFT ARM BRACHIAL ARTERY TO AXILLARY VEIN ARTERIOVENOUS (AV) GORE-TEX GRAFT;  Surgeon: Lanis Fonda BRAVO, MD;  Location: [facility] OR;  Service: Vascular;  Laterality: Left;   CAROTID ARTERY ANGIOPLASTY Right 05/31/2000   ESOPHAGOGASTRODUODENOSCOPY (EGD) WITH PROPOFOL  N/A 11/04/2016   Procedure: ESOPHAGOGASTRODUODENOSCOPY (EGD) WITH PROPOFOL ;   Surgeon: Layla Lah, MD;  Location: [facility];  Service: Gastroenterology;  Laterality: N/A;   IR FLUORO GUIDE CV LINE RIGHT  10/25/2022   IR FLUORO GUIDE CV LINE RIGHT  10/27/2022   IR US  GUIDE VASC ACCESS RIGHT  10/25/2022   IR US  GUIDE VASC ACCESS RIGHT  10/27/2022   LAPAROSCOPIC APPENDECTOMY  02/20/2012   Procedure: APPENDECTOMY LAPAROSCOPIC;  Surgeon: Jina Nephew, MD;  Location: [facility] OR;  Service: General;  Laterality: N/A;   PROSTATE SURGERY     prostatectomy    Allergies  Allergen Reactions   Aspirin  Other (See Comments)    High doses causes stomach ulcer and bleeding    Prior to Admission medications   Medication Sig Start Date End Date Taking? Authorizing Provider  Accu-Chek Softclix Lancets lancets USED TO CHECK BLOOD GLUCOSE TWICE A DAY OR AS NEEDED 06/08/23   Nafziger, Darleene, NP  acetaminophen  (TYLENOL ) 500 MG tablet Take 1,000 mg by mouth every 4 (four) hours as needed.    [provider]  albuterol  (VENTOLIN  HFA) 108 (90 Base) MCG/ACT inhaler Inhale 2 puffs into the lungs every 6 (six) hours as needed for wheezing or shortness of breath. 06/11/24   Nafziger, Darleene, NP  amLODipine  (NORVASC ) 10 MG tablet TAKE 1 TABLET BY MOUTH EVERY DAY 12/05/23   Nafziger, Darleene, NP  amoxicillin  (AMOXIL ) 875 MG tablet Take 875 mg by mouth 2 (two) times daily. 06/22/24   [provider]  apixaban  (ELIQUIS ) 2.5 MG TABS tablet Take 1 tablet (2.5 mg total) by mouth 2 (two) times  daily. 04/27/24   Arlice Reichert, MD  atorvastatin  (LIPITOR) 20 MG tablet TAKE 1 TABLET BY MOUTH EVERY DAY 03/26/24   Nafziger, Darleene, NP  cetirizine  (ZYRTEC ) 5 MG tablet Take 1 tablet (5 mg total) by mouth at bedtime. 03/15/24   Rising, Asberry, PA-C  cholecalciferol  (VITAMIN D3) 25 MCG (1000 UNIT) tablet Take 1,000 Units by mouth daily.    [provider]  cloNIDine  (CATAPRES ) 0.2 MG tablet Take 1 tablet (0.2 mg total) by mouth daily. 05/02/24   Tolia, Sunit, DO  doxazosin  (CARDURA ) 2 MG tablet  Take 2 mg by mouth at bedtime.  12/28/17   [provider]  esomeprazole  (NEXIUM ) 40 MG capsule TAKE 1 CAPSULE BY MOUTH EVERY DAY 03/07/24   Nafziger, Darleene, NP  fluticasone  (FLONASE ) 50 MCG/ACT nasal spray Place 1 spray into both nostrils daily. 03/15/24   Rising, Rebecca, PA-C  glucose blood (ACCU-CHEK GUIDE) test strip USE TO CHECK BLOOD GLUCOSE TWICE A DAY OR AS NEEDED 04/27/23   Nafziger, Darleene, NP  guaiFENesin  (MUCINEX ) 600 MG 12 hr tablet Take 600 mg by mouth at bedtime.    [provider]  isosorbide -hydrALAZINE  (BIDIL ) 20-37.5 MG tablet Take 1 tablet by mouth 3 (three) times daily. 04/27/24   Arlice Reichert, MD  ketoconazole  (NIZORAL ) 2 % cream Apply 1 Application topically 2 (two) times daily. 04/04/24   McDonald, Juliene SAUNDERS, DPM  lactulose  (CHRONULAC ) 10 GM/15ML solution TAKE 30 ML BY MOUTH TWICE DAILY AS NEEDED 07/09/24   Nafziger, Darleene, NP  Melatonin 10 MG TABS Take 10 mg by mouth at bedtime.    [provider]  Methoxy PEG-Epoetin  Beta (MIRCERA IJ) Mircera 10/30/23 10/28/24  [provider]  methylcellulose (CITRUCEL) oral powder 1 tablespoon every 2-3 days,alternating with lactulose     [provider]  Multiple Vitamins-Minerals (PRESERVISION AREDS 2) CAPS Take 1 capsule by mouth daily. 01/29/24   [provider]  Propylene Glycol (SYSTANE BALANCE OP) Place 1 drop into both eyes at bedtime.    [provider]  sevelamer  carbonate (RENVELA ) 800 MG tablet Take 800 mg by mouth 3 (three) times daily. 12/08/22   [provider]    Social History   Socioeconomic History   Marital status: Married    Spouse name: Not on file   Number of children: Not on file   Years of education: Not on file   Highest education level: Not on file  Occupational History   Not on file  Tobacco Use   Smoking status: Former    Current packs/day: 0.00    Average packs/day: 1 pack/day for 10.0 years (10.0 ttl pk-yrs)    Types: Cigarettes    Start date:  08/22/1964    Quit date: 08/22/1974    Years since quitting: 49.9   Smokeless tobacco: Never  Vaping Use   Vaping status: Never Used  Substance and Sexual Activity   Alcohol  use: No    Alcohol /week: 0.0 standard drinks of alcohol    Drug use: No   Sexual activity: Not Currently  Other Topics Concern   Not on file  Social History Narrative   Retired - Administrator, Arts    Married 53 years       He enjoys traveling    Social Drivers of [employer]: Low Risk  (02/09/2023)   Overall Financial Resource Strain (CARDIA)    Difficulty of Paying Living Expenses: Not very hard  Food Insecurity: No Food Insecurity (04/26/2024)   Hunger Vital Sign  Worried About Programme Researcher, Broadcasting/film/video in the Last Year: Never true    Ran Out of Food in the Last Year: Never true  Transportation Needs: No Transportation Needs (04/26/2024)   PRAPARE - Administrator, Civil Service (Medical): No    Lack of Transportation (Non-Medical): No  Physical Activity: Insufficiently Active (02/09/2023)   Exercise Vital Sign    Days of Exercise per Week: 3 days    Minutes of Exercise per Session: 30 min  Stress: No Stress Concern Present (02/09/2023)   [employer] of Occupational Health - Occupational Stress Questionnaire    Feeling of Stress : Not at all  Social Connections: Unknown (04/26/2024)   Social Connection and Isolation Panel    Frequency of Communication with Friends and Family: Patient declined    Frequency of Social Gatherings with Friends and Family: Patient declined    Attends Religious Services: Patient declined    Database Administrator or Organizations: Not on file    Attends Banker Meetings: Patient declined    Marital Status: Married  Catering Manager Violence: Not At Risk (04/26/2024)   Humiliation, Afraid, Rape, and Kick questionnaire    Fear of Current or Ex-Partner: No    Emotionally Abused: No    Physically Abused: No    Sexually Abused: No    Family History  Problem Relation Age of Onset   Hypertension Mother    Cancer Father        Mesothelioma    Stomach cancer Brother    Cancer Brother    Cancer - Cervical Brother    Cancer Brother    Diabetes Brother    Esophageal cancer Neg Hx    Colon cancer Neg Hx    Pancreatic cancer Neg Hx     ROS: Otherwise negative unless mentioned in HPI  Physical Examination  Vitals:   07/15/24 0922 07/15/24 0948  BP: (!) 158/63   Pulse: 70   Resp: 12 16  Temp: 98.2 F (36.8 C)   SpO2: 92%    There is no height or weight on file to calculate BMI.  General:  WDWN in NAD Gait: Not observed HENT: WNL, normocephalic Pulmonary: normal non-labored breathing, without Rales, rhonchi,  wheezing Cardiac: regular Abdomen:  soft, NT/ND, no masses Skin: without rashes Vascular Exam/Pulses: Excellent thrill in the left AV graft Extremities: no ischemic changes, without Gangrene , without cellulitis; without open wounds;  Musculoskeletal: no muscle wasting or atrophy  Neurologic: A&O X 3;  No focal weakness or paresthesias are detected; speech is fluent/normal Psychiatric:  The pt has Normal affect. Lymph:  Unremarkable  CBC    Component Value Date/Time   WBC 10.9 (H) 07/10/2024 2100   RBC 4.60 07/10/2024 2100   HGB 12.3 (L) 07/10/2024 2100   HGB 9.7 (L) 08/19/2019 1107   HCT 39.9 07/10/2024 2100   PLT 306 07/10/2024 2100   PLT 262 08/19/2019 1107   MCV 86.7 07/10/2024 2100   MCH 26.7 07/10/2024 2100   MCHC 30.8 07/10/2024 2100   RDW 20.2 (H) 07/10/2024 2100   LYMPHSABS 0.8 07/10/2024 2100   MONOABS 1.0 07/10/2024 2100   EOSABS 0.2 07/10/2024 2100   BASOSABS 0.1 07/10/2024 2100    BMET    Component Value Date/Time   NA 137 07/10/2024 2100   NA 138 12/02/2019 0000   K 3.8 07/10/2024 2100   CL 95 (L) 07/10/2024 2100   CO2 23 07/10/2024 2100   GLUCOSE 163 (H) 07/10/2024 2100  GLUCOSE 112 (H) 06/19/2006 0948   BUN 25 (H) 07/10/2024 2100   BUN 29 (A) 12/02/2019 0000    CREATININE 5.98 (H) 07/10/2024 2100   CREATININE 3.59 (H) 03/03/2020 1454   CALCIUM  9.6 07/10/2024 2100   GFRNONAA 9 (L) 07/10/2024 2100   GFRNONAA 20 (L) 08/19/2019 1107   GFRAA 19 (L) 02/26/2020 0724   GFRAA 23 (L) 08/19/2019 1107    COAGS: Lab Results  Component Value Date   INR 1.0 04/25/2024   INR 1.2 10/25/2022   INR 1.11 11/04/2016     ASSESSMENT/PLAN: This is a 89 y.o. male with access site pain that occurred after being on dialysis for some amount of time.  No issues with flows, no post cannulation bleeding.  No left upper extremity swelling.  I had a long conversation with Kanyon regarding the above.  The purpose of the fistulogram is to improve flow through the fistula.  He has no flow issues, rather pain at his access site.  Fistulogram will not improve this in any way.  I called his dialysis center to ensure that this was in fact the case.  The nurse who answered the phone and stated that he has had no issues with runs, simply pain with access.  Fistulogram canceled.    Fonda FORBES Rim MD MS [facility] [phone] 07/15/2024  10:18 AM

## 2024-07-15 NOTE — Consult Note (Signed)
 Hospital Consult    Reason for Consult: Left AV graft pain Requesting Physician: Dr. Dolan MRN #:  [id number]  History of Present Illness: This is a 89 y.o. male with end-stage renal disease currently being dialyzed through a left arm AV graft placed in 2024.  Per Richardean the graft has been functioning well.  No low flow, no elevated pressure, no prolonged bleeding.  Tam simply states that after he is on dialysis for a period of time, he has burning at the access sites where his needles were placed.  Sometimes they repositioned and this helps.  Describes this as burning.  He notes no symptoms in the hand.  Past Medical History:  Diagnosis Date   Acute diastolic CHF (congestive heart failure) (HCC) 09/17/2020   Acute exacerbation of CHF (congestive heart failure) (HCC) 08/04/2019   Acute right PCA stroke (HCC) 04/25/2024   ANEMIA DUE TO CHRONIC BLOOD LOSS 03/13/2007   CAROTID ARTERY STENOSIS 05/10/2010   Coronary artery disease    DIABETES MELLITUS, TYPE II 09/19/2007   DISEASE, CEREBROVASCULAR NEC 03/05/2007   ESRD (end stage renal disease) on dialysis (HCC)    GERD 03/13/2007   HYPERLIPIDEMIA 03/05/2007   HYPERTENSION 03/05/2007   HYPOKALEMIA 11/09/2009   KNEE PAIN, RIGHT 11/09/2009   New onset a-fib (HCC) 04/27/2024   Eliquis    PROSTATE CANCER, HX OF 03/05/2007    Past Surgical History:  Procedure Laterality Date   A/V FISTULAGRAM N/A 03/21/2024   Procedure: A/V Fistulagram;  Surgeon: Magda Debby SAILOR, MD;  Location: [facility];  Service: Cardiovascular;  Laterality: N/A;   AV FISTULA PLACEMENT Left 10/18/2022   Procedure: INSERTION OF LEFT ARM BRACHIAL ARTERY TO AXILLARY VEIN ARTERIOVENOUS (AV) GORE-TEX GRAFT;  Surgeon: Lanis Fonda BRAVO, MD;  Location: [facility] OR;  Service: Vascular;  Laterality: Left;   CAROTID ARTERY ANGIOPLASTY Right 05/31/2000   ESOPHAGOGASTRODUODENOSCOPY (EGD) WITH PROPOFOL  N/A 11/04/2016   Procedure: ESOPHAGOGASTRODUODENOSCOPY (EGD) WITH PROPOFOL ;   Surgeon: Layla Lah, MD;  Location: [facility];  Service: Gastroenterology;  Laterality: N/A;   IR FLUORO GUIDE CV LINE RIGHT  10/25/2022   IR FLUORO GUIDE CV LINE RIGHT  10/27/2022   IR US  GUIDE VASC ACCESS RIGHT  10/25/2022   IR US  GUIDE VASC ACCESS RIGHT  10/27/2022   LAPAROSCOPIC APPENDECTOMY  02/20/2012   Procedure: APPENDECTOMY LAPAROSCOPIC;  Surgeon: Jina Nephew, MD;  Location: [facility] OR;  Service: General;  Laterality: N/A;   PROSTATE SURGERY     prostatectomy    Allergies  Allergen Reactions   Aspirin  Other (See Comments)    High doses causes stomach ulcer and bleeding    Prior to Admission medications   Medication Sig Start Date End Date Taking? Authorizing Provider  Accu-Chek Softclix Lancets lancets USED TO CHECK BLOOD GLUCOSE TWICE A DAY OR AS NEEDED 06/08/23   Nafziger, Darleene, NP  acetaminophen  (TYLENOL ) 500 MG tablet Take 1,000 mg by mouth every 4 (four) hours as needed.    [provider]  albuterol  (VENTOLIN  HFA) 108 (90 Base) MCG/ACT inhaler Inhale 2 puffs into the lungs every 6 (six) hours as needed for wheezing or shortness of breath. 06/11/24   Nafziger, Darleene, NP  amLODipine  (NORVASC ) 10 MG tablet TAKE 1 TABLET BY MOUTH EVERY DAY 12/05/23   Nafziger, Darleene, NP  amoxicillin  (AMOXIL ) 875 MG tablet Take 875 mg by mouth 2 (two) times daily. 06/22/24   [provider]  apixaban  (ELIQUIS ) 2.5 MG TABS tablet Take 1 tablet (2.5 mg total) by mouth 2 (two) times  daily. 04/27/24   Dahal, Chapman, MD  atorvastatin  (LIPITOR) 20 MG tablet TAKE 1 TABLET BY MOUTH EVERY DAY 03/26/24   Nafziger, Darleene, NP  cetirizine  (ZYRTEC ) 5 MG tablet Take 1 tablet (5 mg total) by mouth at bedtime. 03/15/24   Rising, Asberry, PA-C  cholecalciferol  (VITAMIN D3) 25 MCG (1000 UNIT) tablet Take 1,000 Units by mouth daily.    [provider]  cloNIDine  (CATAPRES ) 0.2 MG tablet Take 1 tablet (0.2 mg total) by mouth daily. 05/02/24   Tolia, Sunit, DO  doxazosin  (CARDURA ) 2 MG tablet  Take 2 mg by mouth at bedtime.  12/28/17   [provider]  esomeprazole  (NEXIUM ) 40 MG capsule TAKE 1 CAPSULE BY MOUTH EVERY DAY 03/07/24   Nafziger, Darleene, NP  fluticasone  (FLONASE ) 50 MCG/ACT nasal spray Place 1 spray into both nostrils daily. 03/15/24   Rising, Rebecca, PA-C  glucose blood (ACCU-CHEK GUIDE) test strip USE TO CHECK BLOOD GLUCOSE TWICE A DAY OR AS NEEDED 04/27/23   Nafziger, Darleene, NP  guaiFENesin  (MUCINEX ) 600 MG 12 hr tablet Take 600 mg by mouth at bedtime.    [provider]  isosorbide -hydrALAZINE  (BIDIL ) 20-37.5 MG tablet Take 1 tablet by mouth 3 (three) times daily. 04/27/24   Arlice Chapman, MD  ketoconazole  (NIZORAL ) 2 % cream Apply 1 Application topically 2 (two) times daily. 04/04/24   McDonald, Juliene SAUNDERS, DPM  lactulose  (CHRONULAC ) 10 GM/15ML solution TAKE 30 ML BY MOUTH TWICE DAILY AS NEEDED 07/09/24   Nafziger, Darleene, NP  Melatonin 10 MG TABS Take 10 mg by mouth at bedtime.    [provider]  Methoxy PEG-Epoetin  Beta (MIRCERA IJ) Mircera 10/30/23 10/28/24  [provider]  methylcellulose (CITRUCEL) oral powder 1 tablespoon every 2-3 days,alternating with lactulose     [provider]  Multiple Vitamins-Minerals (PRESERVISION AREDS 2) CAPS Take 1 capsule by mouth daily. 01/29/24   [provider]  Propylene Glycol (SYSTANE BALANCE OP) Place 1 drop into both eyes at bedtime.    [provider]  sevelamer  carbonate (RENVELA ) 800 MG tablet Take 800 mg by mouth 3 (three) times daily. 12/08/22   [provider]    Social History   Socioeconomic History   Marital status: Married    Spouse name: Not on file   Number of children: Not on file   Years of education: Not on file   Highest education level: Not on file  Occupational History   Not on file  Tobacco Use   Smoking status: Former    Current packs/day: 0.00    Average packs/day: 1 pack/day for 10.0 years (10.0 ttl pk-yrs)    Types: Cigarettes    Start date:  08/22/1964    Quit date: 08/22/1974    Years since quitting: 49.9   Smokeless tobacco: Never  Vaping Use   Vaping status: Never Used  Substance and Sexual Activity   Alcohol  use: No    Alcohol /week: 0.0 standard drinks of alcohol    Drug use: No   Sexual activity: Not Currently  Other Topics Concern   Not on file  Social History Narrative   Retired - Administrator, Arts    Married 53 years       He enjoys traveling    Social Drivers of [employer]: Low Risk  (02/09/2023)   Overall Financial Resource Strain (CARDIA)    Difficulty of Paying Living Expenses: Not very hard  Food Insecurity: No Food Insecurity (04/26/2024)   Hunger Vital Sign  Worried About Programme Researcher, Broadcasting/film/video in the Last Year: Never true    Ran Out of Food in the Last Year: Never true  Transportation Needs: No Transportation Needs (04/26/2024)   PRAPARE - Administrator, Civil Service (Medical): No    Lack of Transportation (Non-Medical): No  Physical Activity: Insufficiently Active (02/09/2023)   Exercise Vital Sign    Days of Exercise per Week: 3 days    Minutes of Exercise per Session: 30 min  Stress: No Stress Concern Present (02/09/2023)   [employer] of Occupational Health - Occupational Stress Questionnaire    Feeling of Stress : Not at all  Social Connections: Unknown (04/26/2024)   Social Connection and Isolation Panel    Frequency of Communication with Friends and Family: Patient declined    Frequency of Social Gatherings with Friends and Family: Patient declined    Attends Religious Services: Patient declined    Database Administrator or Organizations: Not on file    Attends Banker Meetings: Patient declined    Marital Status: Married  Catering Manager Violence: Not At Risk (04/26/2024)   Humiliation, Afraid, Rape, and Kick questionnaire    Fear of Current or Ex-Partner: No    Emotionally Abused: No    Physically Abused: No    Sexually Abused: No    Family History  Problem Relation Age of Onset   Hypertension Mother    Cancer Father        Mesothelioma    Stomach cancer Brother    Cancer Brother    Cancer - Cervical Brother    Cancer Brother    Diabetes Brother    Esophageal cancer Neg Hx    Colon cancer Neg Hx    Pancreatic cancer Neg Hx     ROS: Otherwise negative unless mentioned in HPI  Physical Examination  Vitals:   07/15/24 0922 07/15/24 0948  BP: (!) 158/63   Pulse: 70   Resp: 12 16  Temp: 98.2 F (36.8 C)   SpO2: 92%    There is no height or weight on file to calculate BMI.  General:  WDWN in NAD Gait: Not observed HENT: WNL, normocephalic Pulmonary: normal non-labored breathing, without Rales, rhonchi,  wheezing Cardiac: regular Abdomen:  soft, NT/ND, no masses Skin: without rashes Vascular Exam/Pulses: Excellent thrill in the left AV graft Extremities: no ischemic changes, without Gangrene , without cellulitis; without open wounds;  Musculoskeletal: no muscle wasting or atrophy  Neurologic: A&O X 3;  No focal weakness or paresthesias are detected; speech is fluent/normal Psychiatric:  The pt has Normal affect. Lymph:  Unremarkable  CBC    Component Value Date/Time   WBC 10.9 (H) 07/10/2024 2100   RBC 4.60 07/10/2024 2100   HGB 12.3 (L) 07/10/2024 2100   HGB 9.7 (L) 08/19/2019 1107   HCT 39.9 07/10/2024 2100   PLT 306 07/10/2024 2100   PLT 262 08/19/2019 1107   MCV 86.7 07/10/2024 2100   MCH 26.7 07/10/2024 2100   MCHC 30.8 07/10/2024 2100   RDW 20.2 (H) 07/10/2024 2100   LYMPHSABS 0.8 07/10/2024 2100   MONOABS 1.0 07/10/2024 2100   EOSABS 0.2 07/10/2024 2100   BASOSABS 0.1 07/10/2024 2100    BMET    Component Value Date/Time   NA 137 07/10/2024 2100   NA 138 12/02/2019 0000   K 3.8 07/10/2024 2100   CL 95 (L) 07/10/2024 2100   CO2 23 07/10/2024 2100   GLUCOSE 163 (H) 07/10/2024 2100  GLUCOSE 112 (H) 06/19/2006 0948   BUN 25 (H) 07/10/2024 2100   BUN 29 (A) 12/02/2019 0000    CREATININE 5.98 (H) 07/10/2024 2100   CREATININE 3.59 (H) 03/03/2020 1454   CALCIUM  9.6 07/10/2024 2100   GFRNONAA 9 (L) 07/10/2024 2100   GFRNONAA 20 (L) 08/19/2019 1107   GFRAA 19 (L) 02/26/2020 0724   GFRAA 23 (L) 08/19/2019 1107    COAGS: Lab Results  Component Value Date   INR 1.0 04/25/2024   INR 1.2 10/25/2022   INR 1.11 11/04/2016     ASSESSMENT/PLAN: This is a 89 y.o. male with access site pain that occurred after being on dialysis for some amount of time.  No issues with flows, no post cannulation bleeding.  No left upper extremity swelling.  I had a long conversation with Griff regarding the above.  The purpose of the fistulogram is to improve flow through the fistula.  He has no flow issues, rather pain at his access site.  Fistulogram will not improve this in any way.  I called his dialysis center to ensure that this was in fact the case.  The nurse who answered the phone and stated that he has had no issues with runs, simply pain with access.  Fistulogram canceled.    Fonda FORBES Rim MD MS [facility] [phone] 07/15/2024  10:13 AM

## 2024-07-16 ENCOUNTER — Ambulatory Visit: Admitting: Occupational Therapy

## 2024-07-17 ENCOUNTER — Ambulatory Visit: Admitting: Occupational Therapy

## 2024-07-17 ENCOUNTER — Ambulatory Visit: Payer: Self-pay | Admitting: Primary Care

## 2024-07-17 DIAGNOSIS — R2681 Unsteadiness on feet: Secondary | ICD-10-CM | POA: Diagnosis not present

## 2024-07-17 DIAGNOSIS — R41842 Visuospatial deficit: Secondary | ICD-10-CM | POA: Diagnosis not present

## 2024-07-17 DIAGNOSIS — R29818 Other symptoms and signs involving the nervous system: Secondary | ICD-10-CM

## 2024-07-17 DIAGNOSIS — R262 Difficulty in walking, not elsewhere classified: Secondary | ICD-10-CM | POA: Diagnosis not present

## 2024-07-17 DIAGNOSIS — M6281 Muscle weakness (generalized): Secondary | ICD-10-CM | POA: Diagnosis not present

## 2024-07-17 NOTE — Progress Notes (Signed)
 Please let patient know PET scan showed slow growing nodule RUL with low level of activity, present since 2018. This is indeterminate, could reflect low-grade adenocarcinoma. Keep apt with Dr. Shelah to discuss next steps and if tissue sampling is warranted

## 2024-07-17 NOTE — Therapy (Signed)
 OUTPATIENT OCCUPATIONAL THERAPY NEURO  Treatment Note  Patient Name: Austin [name] MRN: [id number] DOB:1934-08-31, 89 y.o., male Today's Date: 07/17/2024  PCP: Merna Huxley, NP REFERRING PROVIDER: Merna Huxley, NP  END OF SESSION:  OT End of Session - 07/17/24 1608     Visit Number 8    Number of Visits 10    Date for Recertification  07/26/24    Authorization Type [employer] 2025    Authorization Time Period Auth#: [id number] , approved 10 OT visits from 06/13/2024 - 07/26/2024    Authorization - Visit Number 8    Authorization - Number of Visits 10    OT Start Time 1403    OT Stop Time 1445    OT Time Calculation (min) 42 min               Past Medical History:  Diagnosis Date   Acute diastolic CHF (congestive heart failure) (HCC) 09/17/2020   Acute exacerbation of CHF (congestive heart failure) (HCC) 08/04/2019   Acute right PCA stroke (HCC) 04/25/2024   ANEMIA DUE TO CHRONIC BLOOD LOSS 03/13/2007   CAROTID ARTERY STENOSIS 05/10/2010   Coronary artery disease    DIABETES MELLITUS, TYPE II 09/19/2007   DISEASE, CEREBROVASCULAR NEC 03/05/2007   ESRD (end stage renal disease) on dialysis (HCC)    GERD 03/13/2007   HYPERLIPIDEMIA 03/05/2007   HYPERTENSION 03/05/2007   HYPOKALEMIA 11/09/2009   KNEE PAIN, RIGHT 11/09/2009   New onset a-fib (HCC) 04/27/2024   Eliquis    PROSTATE CANCER, HX OF 03/05/2007   Past Surgical History:  Procedure Laterality Date   A/V FISTULAGRAM N/A 03/21/2024   Procedure: A/V Fistulagram;  Surgeon: Magda Debby SAILOR, MD;  Location: [facility];  Service: Cardiovascular;  Laterality: N/A;   AV FISTULA PLACEMENT Left 10/18/2022   Procedure: INSERTION OF LEFT ARM BRACHIAL ARTERY TO AXILLARY VEIN ARTERIOVENOUS (AV) GORE-TEX GRAFT;  Surgeon: Lanis Fonda BRAVO, MD;  Location: [facility] OR;  Service: Vascular;  Laterality: Left;   CAROTID ARTERY ANGIOPLASTY Right 05/31/2000   ESOPHAGOGASTRODUODENOSCOPY (EGD) WITH PROPOFOL  N/A 11/04/2016    Procedure: ESOPHAGOGASTRODUODENOSCOPY (EGD) WITH PROPOFOL ;  Surgeon: Layla Lah, MD;  Location: [facility];  Service: Gastroenterology;  Laterality: N/A;   IR FLUORO GUIDE CV LINE RIGHT  10/25/2022   IR FLUORO GUIDE CV LINE RIGHT  10/27/2022   IR US  GUIDE VASC ACCESS RIGHT  10/25/2022   IR US  GUIDE VASC ACCESS RIGHT  10/27/2022   LAPAROSCOPIC APPENDECTOMY  02/20/2012   Procedure: APPENDECTOMY LAPAROSCOPIC;  Surgeon: Jina Nephew, MD;  Location: [facility] OR;  Service: General;  Laterality: N/A;   PROSTATE SURGERY     prostatectomy   Patient Active Problem List   Diagnosis Date Noted   New onset atrial fibrillation (HCC) 04/27/2024   Acute CVA (cerebrovascular accident) (HCC) 04/25/2024   Lumbar facet arthropathy 04/16/2024   Secondary hyperparathyroidism of renal origin 06/02/2023   Preoperative evaluation of a medical condition to rule out surgical contraindications (TAR required) 05/04/2023   Right carpal tunnel syndrome 03/16/2023   Coagulation defect, unspecified 11/07/2022   Personal history of nicotine dependence 11/01/2022   ESRD on dialysis (HCC) 10/29/2022   Protein-calorie malnutrition, severe 10/28/2022   Anemia in chronic kidney disease 10/28/2022   Hyperlipidemia, unspecified 10/28/2022   Pneumonia due to COVID-19 virus 10/25/2022   CKD (chronic kidney disease) stage 5, GFR less than 15 ml/min (HCC) 10/25/2022   Severe pulmonary hypertension (HCC) 10/25/2022   Moderate aortic regurgitation 10/25/2022   CAD in native artery 10/25/2022  UTI (urinary tract infection) 09/27/2021   CHF exacerbation (HCC) 09/26/2021   Acute respiratory failure with hypoxia (HCC) 09/26/2021   Acute on chronic diastolic CHF (congestive heart failure) (HCC) 07/09/2021   Dyspnea 06/01/2021   Upper airway cough syndrome 04/06/2021   Porokeratosis 03/26/2021   Pulmonary nodules 10/22/2020   Acute on chronic renal failure 10/09/2020   Mass of right lung 09/18/2020   Acute kidney injury  superimposed on CKD 08/04/2019   Elevated troponin 08/04/2019   Hypokalemia 08/04/2019   Hypertensive urgency 03/28/2019   Elevated serum immunoglobulin free light chains 02/20/2019   Gastrointestinal hemorrhage with melena    Melena 11/03/2016   Carotid artery stenosis 05/10/2010   Type 2 diabetes mellitus with hyperlipidemia (HCC) 09/19/2007   Iron  deficiency anemia due to chronic blood loss 03/13/2007   GERD 03/13/2007   Essential hypertension 03/05/2007   DISEASE, CEREBROVASCULAR NEC 03/05/2007   PROSTATE CANCER, HX OF 03/05/2007    ONSET DATE: referral date 05/24/24  REFERRING DIAG: H53.9 (ICD-10-CM) - Visual disturbance  THERAPY DIAG:  Other symptoms and signs involving the nervous system  Visuospatial deficit  Unsteadiness on feet  Rationale for Evaluation and Treatment: Rehabilitation  SUBJECTIVE:   SUBJECTIVE STATEMENT: Pt reporting that he is going to be baking a coconut cake.  Pt accompanied by: self  PERTINENT HISTORY: Admitted to [facility] on 9/4 with visual changes over the last 4 days. MRI reveals acute R PCA infarct and remote R basal ganglia infarct with evidence of prior hemorrhage.   H/o: CHF, carotid artery stenosis, CAD, DMII, HLD, HTN, prostate CA, L AV fistula Dialysis: M, W, F  PRECAUTIONS: Fall and Other: L AV fistula- no L UE BP   WEIGHT BEARING RESTRICTIONS: No  PAIN:  Are you having pain? No  FALLS: Has patient fallen in last 6 months? No  LIVING ENVIRONMENT: Lives with: lives with their spouse Lives in: House/apartment Stairs: 3-4 steps to enter with railing; 2 story home with bedroom/full bath on the second floor Has following equipment at home: Vannie - 2 wheeled, Environmental Consultant - 4 wheeled, Wheelchair (manual), and quad tip cane  PLOF: Independent with basic ADLs and wife was assisting with shower transfers, cooking, cleaning   PATIENT GOALS: strategies to aid in vision; more energy to do things  OBJECTIVE:  Note: Objective measures were  completed at Evaluation unless otherwise noted.  HAND DOMINANCE: Right  ADLs: Transfers/ambulation related to ADLs: using quad tip cane and cues for L attention Eating: Mod I Grooming: some difficulty with shaving, but I manage UB Dressing: Mod I LB Dressing: Mod I Toileting: Mod I Bathing: occasional assist with washing back Tub Shower transfers: very difficult to get out of garden tub  IADLs: Shopping: wife completes Light housekeeping: wife completes Meal Prep: would make breakfast prior to stroke, but has not since Community mobility: not driving Medication management: spouse administers meds, has used a pill box in the past Financial management: wife completes  MOBILITY STATUS: utilizing quad tip cane   POSTURE COMMENTS:  rounded shoulders, forward head, and shoulders elevations   ACTIVITY TOLERANCE: Activity tolerance: reports decreased activity tolerance  UPPER EXTREMITY ROM:  WFL bilaterally   UPPER EXTREMITY MMT:   4+5 overall   COORDINATION: 9 Hole Peg test: Right: 47.44 sec; Left: 58.57 sec - pt picking up additional pegs to place due to decreased visual attention and looking for additional holes  SENSATION: WFL  COGNITION: Overall cognitive status: spouse reports that he gets confused more since the stroke than  before  VISION: Subjective report: decreased attention to L Baseline vision: Bifocals and Wears glasses all the time Visual history: cataracts, had those removed  VISION ASSESSMENT: Gaze preference/alignment: WDL Tracking/Visual pursuits: Decreased smoothness with horizontal tracking Saccades: undershoots, decreased speed of saccadic movements, and decreased scanning to L visual field Visual Fields: Left visual field deficits Confrontation testing: pt unable to identify stimulus in Left visual field with or without stimulus on Right Line bisection: pt correctly bisecting 4/6 lines, verbally stating where is the end of the line and  turning head to scan full paper in 2 instances.  Patient has difficulty with following activities due to following visual impairments: reading from medical list as pt frequently omitting words on far left  PERCEPTION: Impaired: Inattention/neglect: does not attend to left visual field  OBSERVATIONS: Pt requiring verbal cues for visual attention and scanning to Left while navigating through treatment space as pt initially not able to see treatment room on Left side of gym.                                                                                                                             TREATMENT DATE:  07/17/24 Visual attention/scanning: engaged in large grip peg board pattern replication on vertical surface to challenge standing balance and endurance as well as arousal.  Pegs and pattern placed to L to challenge visual attention to L environment and then completing pattern replication at midline.   Visual scanning: engaged in scanning magnetic board on vertical surface to identify letters in mixed letters to spell pt's first name.  Pt initially verbalizing difficulty with identifying letters, however once oriented to board pt then able to scan to locate all letters with min to no difficulty.  Transitioned board to table top to assess vision, pt with similar results on table top compared to vertical.   Trail making: Completed Trail making A in 3:20.  Pt demonstrating increased difficulty when scanning to locate numbers on L side of paper, but with good sequencing and pacing when attending to numbers at midline and right side of paper.      07/04/24 Visual scanning: pt brought in word search from previous session.  Pt locating 27 of 36 words, therefore OT identifying the missing ones.  Pt locating one during session, but encouraged to locate the remaining words at home. Visual attention/scanning: OT placing shape cards in left visual field and pattern placed at midline.  Pt to scan to L to  locate matching cards to copy pattern.  Pt requiring auditory cues to keep aroused and tapping to Left side of table to facilitate scanning to L for shape cards.  Pt requiring frequent scanning to R <> L to identify and then locate matching cards to pattern with external cues from therapist. Scavenger hunt: pt ambulating throughout treatment gym to locate red cones.  Pt correctly locating 4 of 5 cones and only missing 5th cone due to obscured by another patient  nearby.  Pt demonstrating improved visual scanning with mobility compared to seated.     07/02/24 Visual attention: engaged in matching cards on vertical surface to challenge attention and arousal.  OT initiating task with pt reading out cards on vertical surface, pt able to complete from L to R without any cues.  OT then moving target further to Left with pt still demonstrating improvements in visual scanning to L with vertical surface.  OT holding one card at a time for pt to match to vertical card board, progressing to field of 9 to complete in ascending order with good attention and ability to locate on L field.  OT then placing 9 cards to pt's left and 9 on pt's right, challenging pt to scan to R and L to match cards to card board.  Pt demonstrating increased difficulty with larger area, initially omitting card on far L.  Word search: engaged in horizontal word search with OT providing with anchor on Left to increase visual attention and scanning to left side of paper.  Pt demonstrating initial good attention to task, locating initial 3 words prior to then starting to nod off. Pt unable to find 4th word, despite cues to scan to Left.  OT sent paper home with pt to attempt to complete prior to next visit.      PATIENT EDUCATION: Education details: visual scanning and Left anchor during table top tasks Person educated: Patient and Spouse Education method: Explanation, Verbal cues, and Handouts Education comprehension: verbalized  understanding and needs further education  HOME EXERCISE PROGRAM: Visual spatial awareness - see pt instructions   GOALS: Goals reviewed with patient? Yes  SHORT TERM GOALS: Target date: 07/05/24  Pt and spouse with verbalize and demonstrate understanding of visual compensatory strategies. Baseline: Goal status: in progress  2.  Pt will be independent in visual exercises to increase awareness and vision in L visual field.  Baseline:  Goal status: in progress  3.  Pt will complete table top scanning activity with min cues for technique. Baseline:  Goal status: in progress  4.  Pt will verbalize understanding of safe tub/shower transfers. Baseline:  Goal status: in progress   LONG TERM GOALS: Target date: 07/26/24  Pt will navigate a moderately busy environment, following multi-step commands with 90% accuracy Baseline:  Goal status: in progress  2.  Pt will complete table top scanning activity with Supervision in standing for increased visual attention and standing tolerance. Baseline:  Goal status: in progress  3.  Pt will demonstrate improved fine motor coordination for ADLs and visual attention to Left visual field as evidenced by decreasing 9 hole peg test score for BUE by 5 secs on RUE and 10 seconds on LUE.  Baseline: Right: 47.44 sec; Left: 58.57 sec Goal status: in progress  4.  Pt will verbalize and/or demonstrate understanding of energy conservation strategies to increase engagement in and safety with ADLs and IADLs.  Baseline:  Goal status: in progress    ASSESSMENT:  CLINICAL IMPRESSION: Patient is a 89 y.o. male who was seen today for occupational therapy treatment for visual deficits s/p CVA. Pt demonstrating ongoing impairments of L visual field and inattention, however able to utilizing head turns and visual scanning to complete all tasks this session.  OT positioning self to pt's left at mat table, when standing, and when ambulating to increase visual  scanning to left and provide buffer during ambulation.  Pt demonstrating improved arousal and engagement in session with dynamic mobility and  with dynamic activities.    PERFORMANCE DEFICITS: in functional skills including ADLs, IADLs, Fine motor control, balance, body mechanics, endurance, decreased knowledge of precautions, decreased knowledge of use of DME, and vision, cognitive skills including memory and safety awareness, and psychosocial skills including coping strategies, environmental adaptation, and routines and behaviors.     PLAN:  OT FREQUENCY: 2x/week for 3 weeks, then 1x/week for additional 3 weeks  OT DURATION: 6 weeks  PLANNED INTERVENTIONS: 97168 OT Re-evaluation, 97535 self care/ADL training, 02889 therapeutic exercise, 97530 therapeutic activity, 97112 neuromuscular re-education, functional mobility training, visual/perceptual remediation/compensation, psychosocial skills training, energy conservation, coping strategies training, patient/family education, and DME and/or AE instructions  RECOMMENDED OTHER SERVICES: NA  CONSULTED AND AGREED WITH PLAN OF CARE: Patient and family member/caregiver  PLAN FOR NEXT SESSION: complete peg board pattern replication with small pegs, educate on visual scanning during table top and with mobility.  Use of vertical surfaces for increased arousal, Incorporate mobility with scavenger hunt type tasks for arousal and participation  Assess goals and d/c at next visit if appropriate    KAYLENE DOMINO, OTR/L 07/17/2024, 4:08 PM   [facility] [address] [place], [place] [address] Phone # [phone] Fax # [phone]

## 2024-07-17 NOTE — Progress Notes (Signed)
 [id number], pt hung up.

## 2024-07-19 DIAGNOSIS — Z992 Dependence on renal dialysis: Secondary | ICD-10-CM | POA: Diagnosis not present

## 2024-07-21 DIAGNOSIS — Z992 Dependence on renal dialysis: Secondary | ICD-10-CM | POA: Diagnosis not present

## 2024-07-21 DIAGNOSIS — N186 End stage renal disease: Secondary | ICD-10-CM | POA: Diagnosis not present

## 2024-07-21 DIAGNOSIS — E1022 Type 1 diabetes mellitus with diabetic chronic kidney disease: Secondary | ICD-10-CM | POA: Diagnosis not present

## 2024-07-23 ENCOUNTER — Ambulatory Visit: Attending: Adult Health | Admitting: Occupational Therapy

## 2024-07-23 DIAGNOSIS — R262 Difficulty in walking, not elsewhere classified: Secondary | ICD-10-CM | POA: Insufficient documentation

## 2024-07-23 DIAGNOSIS — R2689 Other abnormalities of gait and mobility: Secondary | ICD-10-CM | POA: Insufficient documentation

## 2024-07-23 DIAGNOSIS — M6281 Muscle weakness (generalized): Secondary | ICD-10-CM | POA: Insufficient documentation

## 2024-07-23 DIAGNOSIS — R278 Other lack of coordination: Secondary | ICD-10-CM | POA: Insufficient documentation

## 2024-07-23 DIAGNOSIS — R29818 Other symptoms and signs involving the nervous system: Secondary | ICD-10-CM | POA: Insufficient documentation

## 2024-07-23 DIAGNOSIS — R41842 Visuospatial deficit: Secondary | ICD-10-CM | POA: Insufficient documentation

## 2024-07-23 DIAGNOSIS — R2681 Unsteadiness on feet: Secondary | ICD-10-CM | POA: Insufficient documentation

## 2024-07-23 NOTE — Therapy (Incomplete)
 OUTPATIENT OCCUPATIONAL THERAPY NEURO  Treatment Note  Patient Name: Austin [name] MRN: [id number] DOB:05/22/1935, 89 y.o., male Today's Date: 07/23/2024  PCP: Merna Huxley, NP REFERRING PROVIDER: Merna Huxley, NP  END OF SESSION:         Past Medical History:  Diagnosis Date   Acute diastolic CHF (congestive heart failure) (HCC) 09/17/2020   Acute exacerbation of CHF (congestive heart failure) (HCC) 08/04/2019   Acute right PCA stroke (HCC) 04/25/2024   ANEMIA DUE TO CHRONIC BLOOD LOSS 03/13/2007   CAROTID ARTERY STENOSIS 05/10/2010   Coronary artery disease    DIABETES MELLITUS, TYPE II 09/19/2007   DISEASE, CEREBROVASCULAR NEC 03/05/2007   ESRD (end stage renal disease) on dialysis (HCC)    GERD 03/13/2007   HYPERLIPIDEMIA 03/05/2007   HYPERTENSION 03/05/2007   HYPOKALEMIA 11/09/2009   KNEE PAIN, RIGHT 11/09/2009   New onset a-fib (HCC) 04/27/2024   Eliquis    PROSTATE CANCER, HX OF 03/05/2007   Past Surgical History:  Procedure Laterality Date   A/V FISTULAGRAM N/A 03/21/2024   Procedure: A/V Fistulagram;  Surgeon: Magda Debby SAILOR, MD;  Location: [facility];  Service: Cardiovascular;  Laterality: N/A;   AV FISTULA PLACEMENT Left 10/18/2022   Procedure: INSERTION OF LEFT ARM BRACHIAL ARTERY TO AXILLARY VEIN ARTERIOVENOUS (AV) GORE-TEX GRAFT;  Surgeon: Lanis Fonda BRAVO, MD;  Location: [facility] OR;  Service: Vascular;  Laterality: Left;   CAROTID ARTERY ANGIOPLASTY Right 05/31/2000   ESOPHAGOGASTRODUODENOSCOPY (EGD) WITH PROPOFOL  N/A 11/04/2016   Procedure: ESOPHAGOGASTRODUODENOSCOPY (EGD) WITH PROPOFOL ;  Surgeon: Layla Lah, MD;  Location: [facility];  Service: Gastroenterology;  Laterality: N/A;   IR FLUORO GUIDE CV LINE RIGHT  10/25/2022   IR FLUORO GUIDE CV LINE RIGHT  10/27/2022   IR US  GUIDE VASC ACCESS RIGHT  10/25/2022   IR US  GUIDE VASC ACCESS RIGHT  10/27/2022   LAPAROSCOPIC APPENDECTOMY  02/20/2012   Procedure: APPENDECTOMY LAPAROSCOPIC;  Surgeon:  Jina Nephew, MD;  Location: [facility];  Service: General;  Laterality: N/A;   PROSTATE SURGERY     prostatectomy   Patient Active Problem List   Diagnosis Date Noted   New onset atrial fibrillation (HCC) 04/27/2024   Acute CVA (cerebrovascular accident) (HCC) 04/25/2024   Lumbar facet arthropathy 04/16/2024   Secondary hyperparathyroidism of renal origin 06/02/2023   Preoperative evaluation of a medical condition to rule out surgical contraindications (TAR required) 05/04/2023   Right carpal tunnel syndrome 03/16/2023   Coagulation defect, unspecified 11/07/2022   Personal history of nicotine dependence 11/01/2022   ESRD on dialysis (HCC) 10/29/2022   Protein-calorie malnutrition, severe 10/28/2022   Anemia in chronic kidney disease 10/28/2022   Hyperlipidemia, unspecified 10/28/2022   Pneumonia due to COVID-19 virus 10/25/2022   CKD (chronic kidney disease) stage 5, GFR less than 15 ml/min (HCC) 10/25/2022   Severe pulmonary hypertension (HCC) 10/25/2022   Moderate aortic regurgitation 10/25/2022   CAD in native artery 10/25/2022   UTI (urinary tract infection) 09/27/2021   CHF exacerbation (HCC) 09/26/2021   Acute respiratory failure with hypoxia (HCC) 09/26/2021   Acute on chronic diastolic CHF (congestive heart failure) (HCC) 07/09/2021   Dyspnea 06/01/2021   Upper airway cough syndrome 04/06/2021   Porokeratosis 03/26/2021   Pulmonary nodules 10/22/2020   Acute on chronic renal failure 10/09/2020   Mass of right lung 09/18/2020   Acute kidney injury superimposed on CKD 08/04/2019   Elevated troponin 08/04/2019   Hypokalemia 08/04/2019   Hypertensive urgency 03/28/2019   Elevated serum immunoglobulin free light chains 02/20/2019  Gastrointestinal hemorrhage with melena    Melena 11/03/2016   Carotid artery stenosis 05/10/2010   Type 2 diabetes mellitus with hyperlipidemia (HCC) 09/19/2007   Iron  deficiency anemia due to chronic blood loss 03/13/2007   GERD 03/13/2007    Essential hypertension 03/05/2007   DISEASE, CEREBROVASCULAR NEC 03/05/2007   PROSTATE CANCER, HX OF 03/05/2007    ONSET DATE: referral date 05/24/24  REFERRING DIAG: H53.9 (ICD-10-CM) - Visual disturbance  THERAPY DIAG:  No diagnosis found.  Rationale for Evaluation and Treatment: Rehabilitation  SUBJECTIVE:   SUBJECTIVE STATEMENT: Pt reporting that he is going to be baking a coconut cake.  Pt accompanied by: self  PERTINENT HISTORY: Admitted to [facility] on 9/4 with visual changes over the last 4 days. MRI reveals acute R PCA infarct and remote R basal ganglia infarct with evidence of prior hemorrhage.   H/o: CHF, carotid artery stenosis, CAD, DMII, HLD, HTN, prostate CA, L AV fistula Dialysis: M, W, F  PRECAUTIONS: Fall and Other: L AV fistula- no L UE BP   WEIGHT BEARING RESTRICTIONS: No  PAIN:  Are you having pain? No  FALLS: Has patient fallen in last 6 months? No  LIVING ENVIRONMENT: Lives with: lives with their spouse Lives in: House/apartment Stairs: 3-4 steps to enter with railing; 2 story home with bedroom/full bath on the second floor Has following equipment at home: Vannie - 2 wheeled, Environmental Consultant - 4 wheeled, Wheelchair (manual), and quad tip cane  PLOF: Independent with basic ADLs and wife was assisting with shower transfers, cooking, cleaning   PATIENT GOALS: strategies to aid in vision; more energy to do things  OBJECTIVE:  Note: Objective measures were completed at Evaluation unless otherwise noted.  HAND DOMINANCE: Right  ADLs: Transfers/ambulation related to ADLs: using quad tip cane and cues for L attention Eating: Mod I Grooming: some difficulty with shaving, but I manage UB Dressing: Mod I LB Dressing: Mod I Toileting: Mod I Bathing: occasional assist with washing back Tub Shower transfers: very difficult to get out of garden tub  IADLs: Shopping: wife completes Light housekeeping: wife completes Meal Prep: would make breakfast prior to  stroke, but has not since Community mobility: not driving Medication management: spouse administers meds, has used a pill box in the past Financial management: wife completes  MOBILITY STATUS: utilizing quad tip cane   POSTURE COMMENTS:  rounded shoulders, forward head, and shoulders elevations   ACTIVITY TOLERANCE: Activity tolerance: reports decreased activity tolerance  UPPER EXTREMITY ROM:  WFL bilaterally   UPPER EXTREMITY MMT:   4+5 overall   COORDINATION: 9 Hole Peg test: Right: 47.44 sec; Left: 58.57 sec - pt picking up additional pegs to place due to decreased visual attention and looking for additional holes  SENSATION: WFL  COGNITION: Overall cognitive status: spouse reports that he gets confused more since the stroke than before  VISION: Subjective report: decreased attention to L Baseline vision: Bifocals and Wears glasses all the time Visual history: cataracts, had those removed  VISION ASSESSMENT: Gaze preference/alignment: WDL Tracking/Visual pursuits: Decreased smoothness with horizontal tracking Saccades: undershoots, decreased speed of saccadic movements, and decreased scanning to L visual field Visual Fields: Left visual field deficits Confrontation testing: pt unable to identify stimulus in Left visual field with or without stimulus on Right Line bisection: pt correctly bisecting 4/6 lines, verbally stating where is the end of the line and turning head to scan full paper in 2 instances.  Patient has difficulty with following activities due to following visual impairments: reading  from medical list as pt frequently omitting words on far left  PERCEPTION: Impaired: Inattention/neglect: does not attend to left visual field  OBSERVATIONS: Pt requiring verbal cues for visual attention and scanning to Left while navigating through treatment space as pt initially not able to see treatment room on Left side of gym.                                                                                                                              TREATMENT DATE:  07/23/24    Complete peg board pattern replication with small pegs, educate on visual scanning during table top and with mobility.  Use of vertical surfaces for increased arousal, Incorporate mobility with scavenger hunt type tasks for arousal and participation  Assess goals and d/c at next visit if appropriate   07/17/24 Visual attention/scanning: engaged in large grip peg board pattern replication on vertical surface to challenge standing balance and endurance as well as arousal.  Pegs and pattern placed to L to challenge visual attention to L environment and then completing pattern replication at midline.   Visual scanning: engaged in scanning magnetic board on vertical surface to identify letters in mixed letters to spell pt's first name.  Pt initially verbalizing difficulty with identifying letters, however once oriented to board pt then able to scan to locate all letters with min to no difficulty.  Transitioned board to table top to assess vision, pt with similar results on table top compared to vertical.   Trail making: Completed Trail making A in 3:20.  Pt demonstrating increased difficulty when scanning to locate numbers on L side of paper, but with good sequencing and pacing when attending to numbers at midline and right side of paper.      07/04/24 Visual scanning: pt brought in word search from previous session.  Pt locating 27 of 36 words, therefore OT identifying the missing ones.  Pt locating one during session, but encouraged to locate the remaining words at home. Visual attention/scanning: OT placing shape cards in left visual field and pattern placed at midline.  Pt to scan to L to locate matching cards to copy pattern.  Pt requiring auditory cues to keep aroused and tapping to Left side of table to facilitate scanning to L for shape cards.  Pt requiring frequent scanning to R  <> L to identify and then locate matching cards to pattern with external cues from therapist. Scavenger hunt: pt ambulating throughout treatment gym to locate red cones.  Pt correctly locating 4 of 5 cones and only missing 5th cone due to obscured by another patient nearby.  Pt demonstrating improved visual scanning with mobility compared to seated.     07/02/24 Visual attention: engaged in matching cards on vertical surface to challenge attention and arousal.  OT initiating task with pt reading out cards on vertical surface, pt able to complete from L to R without any cues.  OT then moving target further  to Left with pt still demonstrating improvements in visual scanning to L with vertical surface.  OT holding one card at a time for pt to match to vertical card board, progressing to field of 9 to complete in ascending order with good attention and ability to locate on L field.  OT then placing 9 cards to pt's left and 9 on pt's right, challenging pt to scan to R and L to match cards to card board.  Pt demonstrating increased difficulty with larger area, initially omitting card on far L.  Word search: engaged in horizontal word search with OT providing with anchor on Left to increase visual attention and scanning to left side of paper.  Pt demonstrating initial good attention to task, locating initial 3 words prior to then starting to nod off. Pt unable to find 4th word, despite cues to scan to Left.  OT sent paper home with pt to attempt to complete prior to next visit.      PATIENT EDUCATION: Education details: visual scanning and Left anchor during table top tasks Person educated: Patient and Spouse Education method: Explanation, Verbal cues, and Handouts Education comprehension: verbalized understanding and needs further education  HOME EXERCISE PROGRAM: Visual spatial awareness - see pt instructions   GOALS: Goals reviewed with patient? Yes  SHORT TERM GOALS: Target date: 07/05/24  Pt  and spouse with verbalize and demonstrate understanding of visual compensatory strategies. Baseline: Goal status: in progress  2.  Pt will be independent in visual exercises to increase awareness and vision in L visual field.  Baseline:  Goal status: in progress  3.  Pt will complete table top scanning activity with min cues for technique. Baseline:  Goal status: in progress  4.  Pt will verbalize understanding of safe tub/shower transfers. Baseline:  Goal status: in progress   LONG TERM GOALS: Target date: 07/26/24  Pt will navigate a moderately busy environment, following multi-step commands with 90% accuracy Baseline:  Goal status: in progress  2.  Pt will complete table top scanning activity with Supervision in standing for increased visual attention and standing tolerance. Baseline:  Goal status: in progress  3.  Pt will demonstrate improved fine motor coordination for ADLs and visual attention to Left visual field as evidenced by decreasing 9 hole peg test score for BUE by 5 secs on RUE and 10 seconds on LUE.  Baseline: Right: 47.44 sec; Left: 58.57 sec Goal status: in progress  4.  Pt will verbalize and/or demonstrate understanding of energy conservation strategies to increase engagement in and safety with ADLs and IADLs.  Baseline:  Goal status: in progress    ASSESSMENT:  CLINICAL IMPRESSION: Patient is a 89 y.o. male who was seen today for occupational therapy treatment for visual deficits s/p CVA. Pt demonstrating ongoing impairments of L visual field and inattention, however able to utilizing head turns and visual scanning to complete all tasks this session.  OT positioning self to pt's left at mat table, when standing, and when ambulating to increase visual scanning to left and provide buffer during ambulation.  Pt demonstrating improved arousal and engagement in session with dynamic mobility and with dynamic activities.    PERFORMANCE DEFICITS: in functional  skills including ADLs, IADLs, Fine motor control, balance, body mechanics, endurance, decreased knowledge of precautions, decreased knowledge of use of DME, and vision, cognitive skills including memory and safety awareness, and psychosocial skills including coping strategies, environmental adaptation, and routines and behaviors.     PLAN:  OT FREQUENCY:  2x/week for 3 weeks, then 1x/week for additional 3 weeks  OT DURATION: 6 weeks  PLANNED INTERVENTIONS: 97168 OT Re-evaluation, 97535 self care/ADL training, 02889 therapeutic exercise, 97530 therapeutic activity, 97112 neuromuscular re-education, functional mobility training, visual/perceptual remediation/compensation, psychosocial skills training, energy conservation, coping strategies training, patient/family education, and DME and/or AE instructions  RECOMMENDED OTHER SERVICES: NA  CONSULTED AND AGREED WITH PLAN OF CARE: Patient and family member/caregiver  PLAN FOR NEXT SESSION: complete peg board pattern replication with small pegs, educate on visual scanning during table top and with mobility.  Use of vertical surfaces for increased arousal, Incorporate mobility with scavenger hunt type tasks for arousal and participation  Assess goals and d/c at next visit if appropriate    KAYLENE DOMINO, OTR/L 07/23/2024, 12:33 PM   [facility] at [facility] [address] [place], [place] [address] Phone # [phone] Fax # [phone]

## 2024-07-25 ENCOUNTER — Encounter: Payer: Self-pay | Admitting: Emergency Medicine

## 2024-07-25 ENCOUNTER — Ambulatory Visit

## 2024-07-25 ENCOUNTER — Ambulatory Visit: Admitting: Emergency Medicine

## 2024-07-25 ENCOUNTER — Encounter (HOSPITAL_COMMUNITY): Payer: Self-pay

## 2024-07-25 ENCOUNTER — Other Ambulatory Visit: Payer: Self-pay | Admitting: Adult Health

## 2024-07-25 VITALS — BP 138/58 | HR 87 | Temp 98.8°F | Ht 67.0 in | Wt 178.6 lb

## 2024-07-25 DIAGNOSIS — R918 Other nonspecific abnormal finding of lung field: Secondary | ICD-10-CM | POA: Diagnosis not present

## 2024-07-25 DIAGNOSIS — R058 Other specified cough: Secondary | ICD-10-CM | POA: Diagnosis not present

## 2024-07-25 MED ORDER — BENZONATATE 100 MG PO CAPS: 100.0000 mg | ORAL_CAPSULE | Freq: Four times a day (QID) | ORAL | 1 refills | Status: DC | PRN

## 2024-07-25 NOTE — Assessment & Plan Note (Signed)
 His serial CT scans and repeat PET scans have been overall stable for several years but there has been some suspected progression going back to as far as 2018.  Given the more recent stability, minimal activity on the PET, his age and comorbidities, I think the most appropriate plan would be continued serial follow-up.  Will plan to repeat his CT scan of the chest in June 2026.  He understands and agrees with this plan.

## 2024-07-25 NOTE — Patient Instructions (Addendum)
 We reviewed your PET scan today. We will plan to repeat your CT scan of the chest in June 2026. Please continue your Zyrtec  as you have been taking it Continue your Mucinex  as you have been taking it Continue your Nexium  as you have been taking it Please try using Tessalon  Perles 100 mg up to every 6 hours to be needed for cough suppression. Follow with Dr. Shelah in June after your CT chest so we can review those results together.

## 2024-07-25 NOTE — Progress Notes (Addendum)
 "  Subjective:    Patient ID: Austin [name], male    DOB: 09-18-1934, 89 y.o.   MRN: [id number]  HPI   ROV 05/16/2024 --follow-up visit for 89 year old male with history of upper airway irritation syndrome and chronic cough in the setting of GERD and chronic rhinitis, chronic renal failure on HD with hypertension and diastolic CHF.  I have also followed him for pulmonary nodular disease with nodules that have been stable on serial imaging including his last in April 2024.  I had planned to repeat his CT in August 2025 but this has not been done yet. He does a lot of throat clearing. He has a lot of drainage especially when eating. No real reflux - uses Nexium  and has good results. He is on zyrtec  and flonase . He is fairly sedentary. He does HD MWF, tolerates, does get some cramping. He had a mild CVA at the beginning of this month.   ROV 07/25/2024 --Austin [name] is an 89 year old gentleman with a history of chronic cough and this setting of upper airway irritation syndrome with GERD and chronic rhinitis.  Also with a history of hypertension and diastolic CHF with chronic renal failure on hemodialysis.  I have followed him for his cough after also pulmonary nodular disease.  I last saw him in November and repeated his CT chest which was done 06/04/2024.  That study showed some slight interval growth of a spiculated posterior segment right upper lobe nodule now 1.6 x 1.5 x 2.2 cm and an interval growth of the solid component of the left upper lobe mixed density nodule 1.6 x 1.2 cm.  A PET scan was done 07/11/2024 as below.  He remains fairly active, goes to HD reliably. He has dry cough, happens through the day and often at night. He is on zyrtec , not taking flonase . Using mucinex  bid. Remains on nexium . He is using albuterol  a few times a week. Often for cough, mixed results.   PET scan 07/11/2024 reviewed by me, shows no evidence of hypermetabolic mediastinal or hilar adenopathy.  Minimal change in the right  upper lobe nodule on most recent imaging but it is larger than March 2018.  There is low-level metabolic activity (SUV 2.5).  This is indeterminate.  No evidence of metastatic disease.   Review of Systems As per HPI     Objective:   Physical Exam Vitals:   07/25/24 1118  BP: (!) 138/58  Pulse: 87  Temp: 98.8 F (37.1 C)  TempSrc: Oral  SpO2: 94%  Weight: 178 lb 9.6 oz (81 kg)  Height: 5' 7 (1.702 m)   Gen: Pleasant, well-nourished, in no distress,  normal affect  ENT: No lesions,  mouth clear,  oropharynx clear, no postnasal drip  Neck: No JVD, no stridor  Lungs: No use of accessory muscles, no crackles or wheezing on normal respiration, no wheeze on forced expiration  Cardiovascular: RRR, heart sounds normal, no murmur or gallops, no peripheral edema  Musculoskeletal: No deformities, no cyanosis or clubbing  Neuro: alert, awake, non focal  Skin: Warm, no lesions or rash     Assessment & Plan:  Pulmonary nodules His serial CT scans and repeat PET scans have been overall stable for several years but there has been some suspected progression going back to as far as 2018.  Given the more recent stability, minimal activity on the PET, his age and comorbidities, I think the most appropriate plan would be continued serial follow-up.  Will plan to repeat his  CT scan of the chest in June 2026.  He understands and agrees with this plan.    Upper airway cough syndrome Still bothersome.  He does treat both GERD and rhinitis.  He feels that he needs to clear mucus but there is none to get up.  Will continue treating both exacerbating factors, add Tessalon  Perles for symptom relief.    Lamar Chris, MD, PhD 07/25/2024, 1:32 PM [facility] [phone] or if no answer before 7:00PM call [phone] For any issues after 7:00PM please call eLink [phone]   Addendum: The patient is scheduled for tooth extraction that will require local lidocaine  and  epinephrine .  He is low risk for this procedure.  No barriers to proceeding long as he is not experiencing flaring cough or dyspnea.  Lamar Chris, MD, PhD 09/02/2024, 10:33 AM [facility] [phone] or if no answer before 7:00PM call [phone] For any issues after 7:00PM please call eLink [phone]  "

## 2024-07-25 NOTE — Telephone Encounter (Signed)
 Copied from CRM #[id number]. Topic: Clinical - Medication Refill >> Jul 25, 2024  3:39 PM Shereese L wrote: Medication: lactulose  (CHRONULAC ) 10 GM/15ML solution fluticasone  (FLONASE ) 50 MCG/ACT nasal spray  Has the patient contacted their pharmacy? Yes (Agent: If no, request that the patient contact the pharmacy for the refill. If patient does not wish to contact the pharmacy document the reason why and proceed with request.) (Agent: If yes, when and what did the pharmacy advise?)  This is the patient's preferred pharmacy:  [id number] - [place], [place] - [address] AT [address] [facility] [address] [place] [address] Phone: [phone] Fax: [phone]   Is this the correct pharmacy for this prescription? Yes If no, delete pharmacy and type the correct one.   Has the prescription been filled recently? Yes  Is the patient out of the medication? Yes  Has the patient been seen for an appointment in the last year OR does the patient have an upcoming appointment? Yes  Can we respond through MyChart? Yes  Agent: Please be advised that Rx refills may take up to 3 business days. We ask that you follow-up with your pharmacy.

## 2024-07-25 NOTE — Assessment & Plan Note (Signed)
 Still bothersome.  He does treat both GERD and rhinitis.  He feels that he needs to clear mucus but there is none to get up.  Will continue treating both exacerbating factors, add Tessalon  Perles for symptom relief.

## 2024-07-26 MED ORDER — FLUTICASONE PROPIONATE 50 MCG/ACT NA SUSP: 1.0000 | Freq: Every day | NASAL | 2 refills | Status: AC

## 2024-07-26 MED ORDER — LACTULOSE 10 GM/15ML PO SOLN: ORAL | 3 refills | Status: AC

## 2024-07-30 DIAGNOSIS — H35033 Hypertensive retinopathy, bilateral: Secondary | ICD-10-CM | POA: Diagnosis not present

## 2024-07-30 DIAGNOSIS — H353132 Nonexudative age-related macular degeneration, bilateral, intermediate dry stage: Secondary | ICD-10-CM | POA: Diagnosis not present

## 2024-07-30 DIAGNOSIS — E113293 Type 2 diabetes mellitus with mild nonproliferative diabetic retinopathy without macular edema, bilateral: Secondary | ICD-10-CM | POA: Diagnosis not present

## 2024-07-30 DIAGNOSIS — H35713 Central serous chorioretinopathy, bilateral: Secondary | ICD-10-CM | POA: Diagnosis not present

## 2024-07-30 DIAGNOSIS — H43823 Vitreomacular adhesion, bilateral: Secondary | ICD-10-CM | POA: Diagnosis not present

## 2024-07-30 DIAGNOSIS — Z961 Presence of intraocular lens: Secondary | ICD-10-CM | POA: Diagnosis not present

## 2024-07-30 DIAGNOSIS — H31092 Other chorioretinal scars, left eye: Secondary | ICD-10-CM | POA: Diagnosis not present

## 2024-08-06 ENCOUNTER — Telehealth: Payer: Self-pay

## 2024-08-06 NOTE — Telephone Encounter (Signed)
 Noted

## 2024-08-06 NOTE — Telephone Encounter (Signed)
 Copied from CRM #[id number]. Topic: Clinical - Medication Question >> Aug 06, 2024  2:50 PM Dedra B wrote: Reason for CRM: Pt called to request refill on apixaban  (ELIQUIS ) 2.5 MG TABS tablet and benzonatate  (TESSALON ) 100 MG capsule. Informed him that he had refills left on the Eliquis . He said the Benzonatate  was prescribed by the hospital. Realized after the call that it was actually prescribed by his Pulmonologist. Attempted to reach pt on home phone and no answer. Attempted to reach him on cell phone and mailbox was full.

## 2024-08-08 NOTE — Progress Notes (Signed)
 Pt saw Dr Shelah on 07-25-24. NFN

## 2024-08-13 ENCOUNTER — Ambulatory Visit: Admitting: Occupational Therapy

## 2024-08-13 ENCOUNTER — Ambulatory Visit: Admitting: Physical Therapy

## 2024-08-21 ENCOUNTER — Encounter: Payer: Self-pay | Admitting: Physical Therapy

## 2024-08-21 ENCOUNTER — Ambulatory Visit

## 2024-08-21 ENCOUNTER — Ambulatory Visit: Admitting: Physical Therapy

## 2024-08-21 DIAGNOSIS — R2689 Other abnormalities of gait and mobility: Secondary | ICD-10-CM

## 2024-08-21 DIAGNOSIS — R262 Difficulty in walking, not elsewhere classified: Secondary | ICD-10-CM | POA: Diagnosis present

## 2024-08-21 DIAGNOSIS — R41842 Visuospatial deficit: Secondary | ICD-10-CM | POA: Diagnosis present

## 2024-08-21 DIAGNOSIS — M6281 Muscle weakness (generalized): Secondary | ICD-10-CM | POA: Diagnosis present

## 2024-08-21 DIAGNOSIS — R278 Other lack of coordination: Secondary | ICD-10-CM | POA: Diagnosis present

## 2024-08-21 DIAGNOSIS — R2681 Unsteadiness on feet: Secondary | ICD-10-CM

## 2024-08-21 DIAGNOSIS — R29818 Other symptoms and signs involving the nervous system: Secondary | ICD-10-CM | POA: Diagnosis present

## 2024-08-21 NOTE — Therapy (Signed)
 " OUTPATIENT OCCUPATIONAL THERAPY NEURO  Treatment Note and Re-cert  Patient Name: Austin [name] MRN: [id number] DOB:[birth year], 89 y.o., male Today's Date: 08/21/2024  PCP: Merna Huxley, NP REFERRING PROVIDER: Merna Huxley, NP  END OF SESSION:  OT End of Session - 08/21/24 1645     Visit Number 9    Number of Visits 10    Authorization Type [employer] 2025    Authorization Time Period awaiting auth    OT Start Time 0803    OT Stop Time 0845    OT Time Calculation (min) 42 min    Behavior During Therapy [facility] for tasks assessed/performed                Past Medical History:  Diagnosis Date   Acute diastolic CHF (congestive heart failure) (HCC) 09/17/2020   Acute exacerbation of CHF (congestive heart failure) (HCC) 08/04/2019   Acute right PCA stroke (HCC) 04/25/2024   ANEMIA DUE TO CHRONIC BLOOD LOSS 03/13/2007   CAROTID ARTERY STENOSIS 05/10/2010   Coronary artery disease    DIABETES MELLITUS, TYPE II 09/19/2007   DISEASE, CEREBROVASCULAR NEC 03/05/2007   ESRD (end stage renal disease) on dialysis (HCC)    GERD 03/13/2007   HYPERLIPIDEMIA 03/05/2007   HYPERTENSION 03/05/2007   HYPOKALEMIA 11/09/2009   KNEE PAIN, RIGHT 11/09/2009   New onset a-fib (HCC) 04/27/2024   Eliquis    PROSTATE CANCER, HX OF 03/05/2007   Past Surgical History:  Procedure Laterality Date   A/V FISTULAGRAM N/A 03/21/2024   Procedure: A/V Fistulagram;  Surgeon: Magda Debby SAILOR, MD;  Location: [facility];  Service: Cardiovascular;  Laterality: N/A;   AV FISTULA PLACEMENT Left 10/18/2022   Procedure: INSERTION OF LEFT ARM BRACHIAL ARTERY TO AXILLARY VEIN ARTERIOVENOUS (AV) GORE-TEX GRAFT;  Surgeon: Lanis Fonda BRAVO, MD;  Location: [facility] OR;  Service: Vascular;  Laterality: Left;   CAROTID ARTERY ANGIOPLASTY Right 05/31/2000   ESOPHAGOGASTRODUODENOSCOPY (EGD) WITH PROPOFOL  N/A 11/04/2016   Procedure: ESOPHAGOGASTRODUODENOSCOPY (EGD) WITH PROPOFOL ;  Surgeon: Layla Lah, MD;   Location: [facility];  Service: Gastroenterology;  Laterality: N/A;   IR FLUORO GUIDE CV LINE RIGHT  10/25/2022   IR FLUORO GUIDE CV LINE RIGHT  10/27/2022   IR US  GUIDE VASC ACCESS RIGHT  10/25/2022   IR US  GUIDE VASC ACCESS RIGHT  10/27/2022   LAPAROSCOPIC APPENDECTOMY  02/20/2012   Procedure: APPENDECTOMY LAPAROSCOPIC;  Surgeon: Jina Nephew, MD;  Location: [facility] OR;  Service: General;  Laterality: N/A;   PROSTATE SURGERY     prostatectomy   Patient Active Problem List   Diagnosis Date Noted   New onset atrial fibrillation (HCC) 04/27/2024   Acute CVA (cerebrovascular accident) (HCC) 04/25/2024   Lumbar facet arthropathy 04/16/2024   Secondary hyperparathyroidism of renal origin 06/02/2023   Preoperative evaluation of a medical condition to rule out surgical contraindications (TAR required) 05/04/2023   Right carpal tunnel syndrome 03/16/2023   Coagulation defect, unspecified 11/07/2022   Personal history of nicotine dependence 11/01/2022   ESRD on dialysis (HCC) 10/29/2022   Protein-calorie malnutrition, severe 10/28/2022   Anemia in chronic kidney disease 10/28/2022   Hyperlipidemia, unspecified 10/28/2022   Pneumonia due to COVID-19 virus 10/25/2022   CKD (chronic kidney disease) stage 5, GFR less than 15 ml/min (HCC) 10/25/2022   Severe pulmonary hypertension (HCC) 10/25/2022   Moderate aortic regurgitation 10/25/2022   CAD in native artery 10/25/2022   UTI (urinary tract infection) 09/27/2021   CHF exacerbation (HCC) 09/26/2021   Acute respiratory failure with hypoxia (HCC)  09/26/2021   Acute on chronic diastolic CHF (congestive heart failure) (HCC) 07/09/2021   Dyspnea 06/01/2021   Upper airway cough syndrome 04/06/2021   Porokeratosis 03/26/2021   Pulmonary nodules 10/22/2020   Acute on chronic renal failure 10/09/2020   Mass of right lung 09/18/2020   Acute kidney injury superimposed on CKD 08/04/2019   Elevated troponin 08/04/2019   Hypokalemia 08/04/2019    Hypertensive urgency 03/28/2019   Elevated serum immunoglobulin free light chains 02/20/2019   Gastrointestinal hemorrhage with melena    Melena 11/03/2016   Carotid artery stenosis 05/10/2010   Type 2 diabetes mellitus with hyperlipidemia (HCC) 09/19/2007   Iron  deficiency anemia due to chronic blood loss 03/13/2007   GERD 03/13/2007   Essential hypertension 03/05/2007   DISEASE, CEREBROVASCULAR NEC 03/05/2007   PROSTATE CANCER, HX OF 03/05/2007    ONSET DATE: referral date 05/24/24  REFERRING DIAG: H53.9 (ICD-10-CM) - Visual disturbance  THERAPY DIAG:  Muscle weakness (generalized)  Other symptoms and signs involving the nervous system  Visuospatial deficit  Other lack of coordination  Rationale for Evaluation and Treatment: Rehabilitation  SUBJECTIVE:   SUBJECTIVE STATEMENT: Pt reporting he bruised his hip backing up to sit in a chair.  Pt accompanied by: self  PERTINENT HISTORY: Admitted to [facility] on 9/4 with visual changes over the last 4 days. MRI reveals acute R PCA infarct and remote R basal ganglia infarct with evidence of prior hemorrhage.   H/o: CHF, carotid artery stenosis, CAD, DMII, HLD, HTN, prostate CA, L AV fistula Dialysis: M, W, F  PRECAUTIONS: Fall and Other: L AV fistula- no L UE BP   WEIGHT BEARING RESTRICTIONS: No  PAIN:  Are you having pain? Yes: NPRS scale: 5/10 Pain location: L foot Pain description: not constant only when pressing down d/t having a corn on his foot Aggravating factors: pressure Relieving factors: rest  FALLS: Has patient fallen in last 6 months? No  LIVING ENVIRONMENT: Lives with: lives with their spouse Lives in: House/apartment Stairs: 3-4 steps to enter with railing; 2 story home with bedroom/full bath on the second floor Has following equipment at home: Vannie - 2 wheeled, Environmental Consultant - 4 wheeled, Wheelchair (manual), and quad tip cane  PLOF: Independent with basic ADLs and wife was assisting with shower transfers,  cooking, cleaning   PATIENT GOALS: strategies to aid in vision; more energy to do things  OBJECTIVE:  Note: Objective measures were completed at Evaluation unless otherwise noted.  HAND DOMINANCE: Right  ADLs: Transfers/ambulation related to ADLs: using quad tip cane and cues for L attention Eating: Mod I Grooming: some difficulty with shaving, but I manage UB Dressing: Mod I LB Dressing: Mod I Toileting: Mod I Bathing: occasional assist with washing back Tub Shower transfers: very difficult to get out of garden tub  IADLs: Shopping: wife completes Light housekeeping: wife completes Meal Prep: would make breakfast prior to stroke, but has not since Community mobility: not driving Medication management: spouse administers meds, has used a pill box in the past Financial management: wife completes  MOBILITY STATUS: utilizing quad tip cane   POSTURE COMMENTS:  rounded shoulders, forward head, and shoulders elevations   ACTIVITY TOLERANCE: Activity tolerance: reports decreased activity tolerance  UPPER EXTREMITY ROM:  WFL bilaterally   UPPER EXTREMITY MMT:   4+5 overall   COORDINATION: 9 Hole Peg test: Right: 47.44 sec; Left: 58.57 sec - pt picking up additional pegs to place due to decreased visual attention and looking for additional holes  SENSATION: [facility]  COGNITION: Overall cognitive status: spouse reports that he gets confused more since the stroke than before  VISION: Subjective report: decreased attention to L Baseline vision: Bifocals and Wears glasses all the time Visual history: cataracts, had those removed  VISION ASSESSMENT: Gaze preference/alignment: WDL Tracking/Visual pursuits: Decreased smoothness with horizontal tracking Saccades: undershoots, decreased speed of saccadic movements, and decreased scanning to L visual field Visual Fields: Left visual field deficits Confrontation testing: pt unable to identify stimulus in Left visual field  with or without stimulus on Right Line bisection: pt correctly bisecting 4/6 lines, verbally stating where is the end of the line and turning head to scan full paper in 2 instances.  Patient has difficulty with following activities due to following visual impairments: reading from medical list as pt frequently omitting words on far left  PERCEPTION: Impaired: Inattention/neglect: does not attend to left visual field  OBSERVATIONS: Pt requiring verbal cues for visual attention and scanning to Left while navigating through treatment space as pt initially not able to see treatment room on Left side of gym.                                                                                                                             TREATMENT DATE:  87/[birth year] Re-cert completed this date. Re-took 9HPT and completed visual scanning in busy environment with 100% accuracy. Discussed tub/shower transfers, pt reports that they are going to obtain either a tub bench or shower chair and grab bar for improving his independence with bathing. Pt agreed to continuing tx to allow for practicing transfers with AE. Pt completed peg board pattern with 100% accuracy.   07/17/24 Visual attention/scanning: engaged in large grip peg board pattern replication on vertical surface to challenge standing balance and endurance as well as arousal.  Pegs and pattern placed to L to challenge visual attention to L environment and then completing pattern replication at midline.   Visual scanning: engaged in scanning magnetic board on vertical surface to identify letters in mixed letters to spell pt's first name.  Pt initially verbalizing difficulty with identifying letters, however once oriented to board pt then able to scan to locate all letters with min to no difficulty.  Transitioned board to table top to assess vision, pt with similar results on table top compared to vertical.   Trail making: Completed Trail making A in 3:20.  Pt  demonstrating increased difficulty when scanning to locate numbers on L side of paper, but with good sequencing and pacing when attending to numbers at midline and right side of paper.      07/04/24 Visual scanning: pt brought in word search from previous session.  Pt locating 27 of 36 words, therefore OT identifying the missing ones.  Pt locating one during session, but encouraged to locate the remaining words at home. Visual attention/scanning: OT placing shape cards in left visual field and pattern placed at midline.  Pt to scan to L to locate  matching cards to copy pattern.  Pt requiring auditory cues to keep aroused and tapping to Left side of table to facilitate scanning to L for shape cards.  Pt requiring frequent scanning to R <> L to identify and then locate matching cards to pattern with external cues from therapist. Scavenger hunt: pt ambulating throughout treatment gym to locate red cones.  Pt correctly locating 4 of 5 cones and only missing 5th cone due to obscured by another patient nearby.  Pt demonstrating improved visual scanning with mobility compared to seated.     07/02/24 Visual attention: engaged in matching cards on vertical surface to challenge attention and arousal.  OT initiating task with pt reading out cards on vertical surface, pt able to complete from L to R without any cues.  OT then moving target further to Left with pt still demonstrating improvements in visual scanning to L with vertical surface.  OT holding one card at a time for pt to match to vertical card board, progressing to field of 9 to complete in ascending order with good attention and ability to locate on L field.  OT then placing 9 cards to pt's left and 9 on pt's right, challenging pt to scan to R and L to match cards to card board.  Pt demonstrating increased difficulty with larger area, initially omitting card on far L.  Word search: engaged in horizontal word search with OT providing with anchor on Left  to increase visual attention and scanning to left side of paper.  Pt demonstrating initial good attention to task, locating initial 3 words prior to then starting to nod off. Pt unable to find 4th word, despite cues to scan to Left.  OT sent paper home with pt to attempt to complete prior to next visit.      PATIENT EDUCATION: Education details: visual scanning and Left anchor during table top tasks Person educated: Patient and Spouse Education method: Explanation, Verbal cues, and Handouts Education comprehension: verbalized understanding and needs further education  HOME EXERCISE PROGRAM: Visual spatial awareness - see pt instructions   GOALS: Goals reviewed with patient? Yes  SHORT TERM GOALS: Target date: 07/05/24  Pt and spouse with verbalize and demonstrate understanding of visual compensatory strategies. Baseline: Goal status: met  2.  Pt will be independent in visual exercises to increase awareness and vision in L visual field.  Baseline:  Goal status: met  3.  Pt will complete table top scanning activity with min cues for technique. Baseline:  Goal status: met  4.  Pt will verbalize understanding of safe tub/shower transfers. Baseline:  Goal status: not met    LONG TERM GOALS: Target date: 07/26/24  Pt will navigate a moderately busy environment, following multi-step commands with 90% accuracy Baseline:  08/21/24: 100% accuracy Goal status: met  2.  Pt will complete table top scanning activity with Supervision in standing for increased visual attention and standing tolerance. Baseline:  Goal status: met  3.  Pt will demonstrate improved fine motor coordination for ADLs and visual attention to Left visual field as evidenced by decreasing 9 hole peg test score for BUE by 5 secs on RUE and 10 seconds on LUE.  Baseline: Right: 47.44 sec; Left: 58.57 sec 08/21/24: Right: 46.69 sec; Left 48 sec Goal status: not met   4.  Pt will verbalize and/or demonstrate  understanding of energy conservation strategies to increase engagement in and safety with ADLs and IADLs.  Baseline:  08/21/24: educated  Goal status: met  NEW SHORT TERM GOALS:    1. Pt will be educated in safe transfer techniques for tub/shower chair/bench   Baseline: AE not arrived yet   Goal status: NEW  NEW LONG TERM GOALS:   Pt will demonstrate improved fine motor coordination for ADLs and visual attention to Left visual field as evidenced by decreasing 9 hole peg test score for BUE by 5 secs on RUE.  Baseline: Right: 47.44 sec; Left: 58.57 sec 08/21/24: Right: 46.69 sec; Left 48 sec Goal status: NEW   2. Pt will report successful Mod I transfer in/out of tub/shower with AE   Baseline: AE not installed yet   Goal status: NEW  ASSESSMENT:  CLINICAL IMPRESSION: Patient is a 89 y.o. male who was seen today for occupational therapy treatment for visual deficits s/p CVA. Patient has met 3/4 short-term goals and 3/4 long-term goals to date. This 1st progress note and re-cert is for dates: 06/13/2024 to 08/21/2024. Pt making progress towards goals as expected and continues to benefit from skilled OT services in the outpatient setting to work towards remaining goals or until max rehab potential is met.     PERFORMANCE DEFICITS: in functional skills including ADLs, IADLs, Fine motor control, balance, body mechanics, endurance, decreased knowledge of precautions, decreased knowledge of use of DME, and vision, cognitive skills including memory and safety awareness, and psychosocial skills including coping strategies, environmental adaptation, and routines and behaviors.    PLAN:  OT FREQUENCY: 1x/week  plus today's visit  OT DURATION: 4 weeks  PLANNED INTERVENTIONS: 97168 OT Re-evaluation, 97535 self care/ADL training, 02889 therapeutic exercise, 97530 therapeutic activity, 97112 neuromuscular re-education, functional mobility training, visual/perceptual remediation/compensation,  psychosocial skills training, energy conservation, coping strategies training, patient/family education, and DME and/or AE instructions  RECOMMENDED OTHER SERVICES: NA  CONSULTED AND AGREED WITH PLAN OF CARE: Patient and family member/caregiver  PLAN FOR NEXT SESSION: Tub transfer bench/chair transfer education  Coordination activities/HEP prn    Rocky Dutch, OTR/L 08/21/2024, 4:46 PM   [facility] [address] [place], [place] [address] Phone # [phone] Fax # [phone]         "

## 2024-08-21 NOTE — Therapy (Signed)
 " OUTPATIENT PHYSICAL THERAPY NEURO TREATMENT/RECERT/PROGRESS NOTE   Patient Name: Austin [name] MRN: [id number] DOB:[birth year], 89 y.o., male Today's Date: 08/21/2024   PCP: Merna Huxley, NP  REFERRING PROVIDER: Merna Huxley, NP  Progress Note Reporting Period 05/23/2024 to 08/21/2024  See note below for Objective Data and Assessment of Progress/Goals.      END OF SESSION:  PT End of Session - 08/21/24 0759     Visit Number 9    Number of Visits 25    Date for Recertification  10/18/24    Authorization Type [employer]-reauth submitted    Authorization Time Period --    Authorization - Number of Visits --    PT Start Time 0847    PT Stop Time 0925    PT Time Calculation (min) 38 min    Equipment Utilized During Treatment Gait belt    Activity Tolerance Patient tolerated treatment well    Behavior During Therapy WFL for tasks assessed/performed           Past Medical History:  Diagnosis Date   Acute diastolic CHF (congestive heart failure) (HCC) 09/17/2020   Acute exacerbation of CHF (congestive heart failure) (HCC) 08/04/2019   Acute right PCA stroke (HCC) 04/25/2024   ANEMIA DUE TO CHRONIC BLOOD LOSS 03/13/2007   CAROTID ARTERY STENOSIS 05/10/2010   Coronary artery disease    DIABETES MELLITUS, TYPE II 09/19/2007   DISEASE, CEREBROVASCULAR NEC 03/05/2007   ESRD (end stage renal disease) on dialysis (HCC)    GERD 03/13/2007   HYPERLIPIDEMIA 03/05/2007   HYPERTENSION 03/05/2007   HYPOKALEMIA 11/09/2009   KNEE PAIN, RIGHT 11/09/2009   New onset a-fib (HCC) 04/27/2024   Eliquis    PROSTATE CANCER, HX OF 03/05/2007   Past Surgical History:  Procedure Laterality Date   A/V FISTULAGRAM N/A 03/21/2024   Procedure: A/V Fistulagram;  Surgeon: Magda Debby SAILOR, MD;  Location: [facility];  Service: Cardiovascular;  Laterality: N/A;   AV FISTULA PLACEMENT Left 10/18/2022   Procedure: INSERTION OF LEFT ARM BRACHIAL ARTERY TO AXILLARY VEIN ARTERIOVENOUS (AV)  GORE-TEX GRAFT;  Surgeon: Lanis Fonda BRAVO, MD;  Location: [facility] OR;  Service: Vascular;  Laterality: Left;   CAROTID ARTERY ANGIOPLASTY Right 05/31/2000   ESOPHAGOGASTRODUODENOSCOPY (EGD) WITH PROPOFOL  N/A 11/04/2016   Procedure: ESOPHAGOGASTRODUODENOSCOPY (EGD) WITH PROPOFOL ;  Surgeon: Layla Lah, MD;  Location: [facility];  Service: Gastroenterology;  Laterality: N/A;   IR FLUORO GUIDE CV LINE RIGHT  10/25/2022   IR FLUORO GUIDE CV LINE RIGHT  10/27/2022   IR US  GUIDE VASC ACCESS RIGHT  10/25/2022   IR US  GUIDE VASC ACCESS RIGHT  10/27/2022   LAPAROSCOPIC APPENDECTOMY  02/20/2012   Procedure: APPENDECTOMY LAPAROSCOPIC;  Surgeon: Jina Nephew, MD;  Location: [facility] OR;  Service: General;  Laterality: N/A;   PROSTATE SURGERY     prostatectomy   Patient Active Problem List   Diagnosis Date Noted   New onset atrial fibrillation (HCC) 04/27/2024   Acute CVA (cerebrovascular accident) (HCC) 04/25/2024   Lumbar facet arthropathy 04/16/2024   Secondary hyperparathyroidism of renal origin 06/02/2023   Preoperative evaluation of a medical condition to rule out surgical contraindications (TAR required) 05/04/2023   Right carpal tunnel syndrome 03/16/2023   Coagulation defect, unspecified 11/07/2022   Personal history of nicotine dependence 11/01/2022   ESRD on dialysis (HCC) 10/29/2022   Protein-calorie malnutrition, severe 10/28/2022   Anemia in chronic kidney disease 10/28/2022   Hyperlipidemia, unspecified 10/28/2022   Pneumonia due to COVID-19 virus 10/25/2022   CKD (chronic  kidney disease) stage 5, GFR less than 15 ml/min (HCC) 10/25/2022   Severe pulmonary hypertension (HCC) 10/25/2022   Moderate aortic regurgitation 10/25/2022   CAD in native artery 10/25/2022   UTI (urinary tract infection) 09/27/2021   CHF exacerbation (HCC) 09/26/2021   Acute respiratory failure with hypoxia (HCC) 09/26/2021   Acute on chronic diastolic CHF (congestive heart failure) (HCC) 07/09/2021   Dyspnea  06/01/2021   Upper airway cough syndrome 04/06/2021   Porokeratosis 03/26/2021   Pulmonary nodules 10/22/2020   Acute on chronic renal failure 10/09/2020   Mass of right lung 09/18/2020   Acute kidney injury superimposed on CKD 08/04/2019   Elevated troponin 08/04/2019   Hypokalemia 08/04/2019   Hypertensive urgency 03/28/2019   Elevated serum immunoglobulin free light chains 02/20/2019   Gastrointestinal hemorrhage with melena    Melena 11/03/2016   Carotid artery stenosis 05/10/2010   Type 2 diabetes mellitus with hyperlipidemia (HCC) 09/19/2007   Iron  deficiency anemia due to chronic blood loss 03/13/2007   GERD 03/13/2007   Essential hypertension 03/05/2007   DISEASE, CEREBROVASCULAR NEC 03/05/2007   PROSTATE CANCER, HX OF 03/05/2007    ONSET DATE: 04/25/24  REFERRING DIAG: I63.9 (ICD-10-CM) - Acute CVA (cerebrovascular accident) (HCC) R26.81 (ICD-10-CM) - Gait instability  THERAPY DIAG:  Unsteadiness on feet  Muscle weakness (generalized)  Difficulty in walking, not elsewhere classified  Other abnormalities of gait and mobility  Rationale for Evaluation and Treatment: Rehabilitation  SUBJECTIVE:                                                                                                                                                                                             SUBJECTIVE STATEMENT: Haven't been able to get the rides.  Have a corn on the bottom of my L foot and it is very painful.  Will follow up with [facility] Ankle doctors. No falls.  Have the walker and use it on dialysis days.  Other than that, take my cane.   Pt accompanied by: significant other and in waiting room  PERTINENT HISTORY: CHF, carotid artery stenosis, CAD, DMII, HLD, HTN, prostate CA, L AV fistula Dialysis: M, W, F  PAIN:  Are you having pain? Yes: NPRS scale: 6-7/10 Pain location: L foot Pain description: corn on bottom of foot Aggravating factors: putting weight on  foot Relieving factors: Tylenol   PRECAUTIONS: Fall and Other: L AV fistula- no L UE BP   RED FLAGS: None   WEIGHT BEARING RESTRICTIONS: No  FALLS: Has patient fallen in last 6 months? No  LIVING ENVIRONMENT: Lives with: lives with their spouse Lives in: House/apartment Stairs: 3-4  steps to enter with railing; 2 story home Has following equipment at home: Vannie - 2 wheeled, Environmental Consultant - 4 wheeled, Wheelchair (manual), and quad tip cane  PLOF: Independent with basic ADLs and wife was assisting with shower transfers, cooking, cleaning   PATIENT GOALS: maybe a little balance; 08/21/2024  continue balance and lessen pain on L foot  OBJECTIVE:    TODAY'S TREATMENT: 08/21/2024 Activity Comments  DGI 8/24 Improved from 7/24  NT due to foot pain  FTSTS  33.19 sec Arms crossed at chest  10 M walk:  31.28 sec = 1.05 ft/sec   Seated leg strengthening: March  x 10 LAQ 2 x 10 Ankle pumps  x 10 2#  Cues for full extension       [facility] PT Assessment - 08/21/24 0911       Standardized Balance Assessment   Standardized Balance Assessment Dynamic Gait Index      Dynamic Gait Index   Level Surface Moderate Impairment   17.91 sec with cane   Change in Gait Speed Moderate Impairment    Gait with Horizontal Head Turns Moderate Impairment   15.41   Gait with Vertical Head Turns Moderate Impairment   15.49 sec   Gait and Pivot Turn Moderate Impairment   4.91 sec   Step Over Obstacle Moderate Impairment    Step Around Obstacles Moderate Impairment    Steps Moderate Impairment    Total Score 8           PATIENT EDUCATION: Education details: objective measure results, POC, continued risk of falls and benefits of continued PT Person educated: Patient Education method: Explanation Education comprehension: verbalized understanding        HOME EXERCISE PROGRAM: Access Code: [email or web address] URL: [email or web address] Date: 06/13/2024 Prepared by: [facility] - Outpatient   Rehab - [facility]  Exercises - Heel Toe Raises with Counter Support  - 1 x daily - 4 x weekly - 2 sets - 10 reps - March in Place  - 1 x daily - 4 x weekly - 2 sets - 10 reps - Side Stepping with Counter Support  - 1 x daily - 4 x weekly - 1 sets - 3 reps - Standing Foot Lift on Box (BKA)  - 1 x daily - 7 x weekly - 1-3 sets - 10 reps -------------------------------------------------------- Note: Objective measures were completed at Evaluation unless otherwise noted.  DIAGNOSTIC FINDINGS:  04/25/24 brain MRI: Acute infarct in the right PCA territory as above. 2. 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 3. Additional remote lacunar infarcts in the left thalamus. 4. Focus of signal abnormality and susceptibility within the left parietal lobe, possibly representing remote hemorrhage or vascular malformation such as cavernous malformation. Additional similar focus of signal abnormality and susceptibility along the right dorsal aspect of the pons.  COGNITION: Overall cognitive status: difficult to discern; requires increased instruction for tasks occasionally    SENSATION: Pt reports N/T in UEs  COORDINATION: Alternating pronation/supination: slight dysmetria B Alternating toe tap: hypokinesia L>R Finger to nose: WNL B  MUSCLE TONE: WNL B LE  POSTURE: rounded shoulders, forward head, and shoulders elevations  LOWER EXTREMITY ROM:     Active  Right Eval Left Eval  Hip flexion    Hip extension    Hip abduction    Hip adduction    Hip internal rotation    Hip external rotation    Knee flexion    Knee extension  Ankle dorsiflexion 8 16  Ankle plantarflexion    Ankle inversion    Ankle eversion     (Blank rows = not tested)  LOWER EXTREMITY MMT:    MMT (in sitting) Right Eval Left Eval  Hip flexion 4+ 4+  Hip extension    Hip abduction 4- 4-  Hip adduction 4- 4-  Hip internal rotation    Hip external rotation     Knee flexion 3+ 3+  Knee extension 4+ 4  Ankle dorsiflexion 4 4  Ankle plantarflexion 4 4  Ankle inversion    Ankle eversion    (Blank rows = not tested)  GAIT: Findings: Assistive device utilized:quad tip cane, Level of assistance: SBA and CGA, and Comments: limited foot clearance B and tendency to shuffle feet; slowed *pt requires increased direction to navigate clinic environment; suspect vision vs. Cognition as a factor   FUNCTIONAL TESTS:  5 times sit to stand: 23.8 sec pushing off LEs without standing fully  10 meter walk test: 17.23 sec with cane (1.9 ft/sec)  [facility] PT Assessment - 08/21/24 0911       Standardized Balance Assessment   Standardized Balance Assessment Dynamic Gait Index      Dynamic Gait Index   Level Surface Moderate Impairment   17.91 sec with cane   Change in Gait Speed Moderate Impairment    Gait with Horizontal Head Turns Moderate Impairment   15.41   Gait with Vertical Head Turns Moderate Impairment   15.49 sec   Gait and Pivot Turn Moderate Impairment   4.91 sec   Step Over Obstacle Moderate Impairment    Step Around Obstacles Moderate Impairment    Steps Moderate Impairment    Total Score 8                                                                                                                                       TREATMENT DATE: 05/23/24    PATIENT EDUCATION: Education details: edu on benefits of OT for vision, prognosis, POC, exam findings as they relate for functional impairments  Person educated: Patient and Spouse Education method: Explanation Education comprehension: verbalized understanding  HOME EXERCISE PROGRAM: Not yet initiated    GOALS: Goals reviewed with patient? Yes  SHORT TERM GOALS: Target date: 06/13/2024>UPDATED TARGET 09/20/2024  Patient to be independent with initial HEP. Baseline: HEP initiated;pt requires cueing Goal status: partially MET, 08/21/2024 Pt will improve 5x sit<>stand to less than or  equal to 25 sec to demonstrate improved functional strength and transfer efficiency. Baseline:  33 sec Goal status:  INITIAL, 08/21/2024    LONG TERM GOALS: Target date: 07/18/2024>UPDATED TARGET 10/18/2024  Patient to be independent with advanced HEP. Baseline:  Goal status: IN PROGRESS 08/21/2024  to be improve to at least 400 ft for improved gait efficiency and endurance. Baseline: 365 ft (NA 08/21/24 due to foot pain) Goal status: IN PROGRESS  Patient to score at least 15/24 on DGI in order to decrease risk of falls.  Baseline: 7>8/24 08/21/2024 Goal status: IN PROGRESS 08/21/2024  Patient to demonstrate 5xSTS test in <15 sec in order to decrease risk of falls.  Baseline: 23 sec>33 sec 08/21/2024 Goal status: IN PROGRESS   Patient to demonstrate gait speed of at least 2.3 ft/sec in order to improve access to community.  Baseline: 1.9 ft/sec; 1.05 ft/sec 08/21/2024 Goal status: IN PROGRESS   ASSESSMENT:  CLINICAL IMPRESSION: Pt presents today after not being here since 07/04/24 as last PT visit.  Unsure why pt has been gone so long-pt reports transportation.  Recert/PN completed today, with pt noting he has had no falls, but he notes walking is painful due to corn on bottom of L foot (he is planning to follow up with podiatrist). Skilled PT session focused on assessing objective measures for LTGs, with pt not yet meeting any of his LTGs.  FTSTS score of 33.19 sec is longer than previous check; DGI score of 8/24 with cane, is slightly improved from 7/24, but pt remains at increased fall risk.  Gait velocity of 1.05 ft/sec is slower than previous check; all of these objective measures place pt at increased fall risk.  Given pt's dialysis and other medical issues, he would benefit from further skilled PT to fully address strength, balance, gait for optimal functional mobility and decreased fall risk.   OBJECTIVE IMPAIRMENTS: Abnormal gait, decreased activity tolerance, decreased  balance, decreased coordination, decreased endurance, difficulty walking, decreased ROM, decreased strength, decreased safety awareness, impaired flexibility, and postural dysfunction.   ACTIVITY LIMITATIONS: carrying, lifting, bending, sitting, standing, squatting, stairs, transfers, bed mobility, bathing, toileting, dressing, reach over head, hygiene/grooming, and locomotion level  PARTICIPATION LIMITATIONS: meal prep, cleaning, shopping, community activity, and church  PERSONAL FACTORS: Age, Fitness, Past/current experiences, Time since onset of injury/illness/exacerbation, and 3+ comorbidities: CHF, carotid artery stenosis, CAD, DMII, HLD, HTN, prostate CA, L AV fistula are also affecting patient's functional outcome.   REHAB POTENTIAL: Good  CLINICAL DECISION MAKING: Evolving/moderate complexity  EVALUATION COMPLEXITY: Moderate  PLAN:  PT FREQUENCY: 1-2x/week  PT DURATION: 8 weeks per recert 08/21/2024  PLANNED INTERVENTIONS: [id number]- PT Re-evaluation, 97750- Physical Performance Testing, 97110-Therapeutic exercises, 97530- Therapeutic activity, 97112- Neuromuscular re-education, 97535- Self Care, [id number]- Manual therapy, [id number]- Gait training, [phone]- Canalith repositioning, Patient/Family education, Balance training, Stair training, Taping, Vestibular training, DME instructions, Cryotherapy, and Moist heat  PLAN FOR NEXT SESSION: Review and progress HEP for BLE strength and balance and update HEP as appropriate. Faster walking w/ rollator (uses one on dialysis days)    Greig Anon, PT 08/21/2024 1:25 PM Phone: [phone] Fax: [phone]   [facility] [address], [address] [place], [place] [address] Phone # [phone] Fax # [phone]    Referring diagnosis:   I63.9 (ICD-10-CM) - Acute CVA (cerebrovascular accident) (HCC)  R26.81 (ICD-10-CM) - Gait instability   Treatment diagnosis (if different than referring  diagnosis): R26.81, M62.81, R26.2, R26.89 Date Symptoms Began: 05/15/2024 # of Visits requested: 17  Time period for Authorization: 08/21/2024 to 10/18/2024  What was this (referring dx) caused by? []  Surgery []  Fall [x]  Ongoing issue []  Arthritis [x]  Other: __CVA__________  Laterality: []  Rt []  Lt [x]  Both  Functional Tool & Score: FTSTS 33.19 sec, gait velocity 1.05 ft/sec, DGI 8/24  Check all possible CPT codes:     See Planned Interventions listed in the Plan section of the Evaluation.  If [employer]: Choose 10 or less codes  If [employer]: Modalities are not covered  If [employer]: Check allowed ICD code combinations   If [facility] Plan or [employer]: Cognitive training not covered    "

## 2024-08-22 ENCOUNTER — Encounter (HOSPITAL_COMMUNITY): Payer: Self-pay

## 2024-08-26 ENCOUNTER — Encounter (HOSPITAL_COMMUNITY): Payer: Self-pay

## 2024-08-27 ENCOUNTER — Ambulatory Visit: Attending: Adult Health | Admitting: Occupational Therapy

## 2024-08-27 ENCOUNTER — Encounter: Payer: Self-pay | Admitting: Physical Therapy

## 2024-08-27 ENCOUNTER — Telehealth: Payer: Self-pay | Admitting: Physical Therapy

## 2024-08-27 ENCOUNTER — Ambulatory Visit: Attending: Adult Health | Admitting: Physical Therapy

## 2024-08-27 ENCOUNTER — Telehealth: Payer: Self-pay | Admitting: Cardiology

## 2024-08-27 ENCOUNTER — Ambulatory Visit: Admitting: Podiatry

## 2024-08-27 ENCOUNTER — Encounter: Payer: Self-pay | Admitting: Podiatry

## 2024-08-27 DIAGNOSIS — E1151 Type 2 diabetes mellitus with diabetic peripheral angiopathy without gangrene: Secondary | ICD-10-CM | POA: Diagnosis not present

## 2024-08-27 DIAGNOSIS — R29818 Other symptoms and signs involving the nervous system: Secondary | ICD-10-CM | POA: Insufficient documentation

## 2024-08-27 DIAGNOSIS — R41842 Visuospatial deficit: Secondary | ICD-10-CM | POA: Diagnosis present

## 2024-08-27 DIAGNOSIS — I70209 Unspecified atherosclerosis of native arteries of extremities, unspecified extremity: Secondary | ICD-10-CM | POA: Diagnosis not present

## 2024-08-27 DIAGNOSIS — L84 Corns and callosities: Secondary | ICD-10-CM | POA: Diagnosis not present

## 2024-08-27 DIAGNOSIS — M6281 Muscle weakness (generalized): Secondary | ICD-10-CM | POA: Diagnosis present

## 2024-08-27 DIAGNOSIS — R2681 Unsteadiness on feet: Secondary | ICD-10-CM | POA: Diagnosis present

## 2024-08-27 DIAGNOSIS — R262 Difficulty in walking, not elsewhere classified: Secondary | ICD-10-CM | POA: Diagnosis present

## 2024-08-27 DIAGNOSIS — R278 Other lack of coordination: Secondary | ICD-10-CM | POA: Insufficient documentation

## 2024-08-27 DIAGNOSIS — R2689 Other abnormalities of gait and mobility: Secondary | ICD-10-CM | POA: Insufficient documentation

## 2024-08-27 NOTE — Telephone Encounter (Signed)
 Used the provided number, received a dial tone. Redialed several times, not able to get call to go through.  Reviewed pt's chart, I do not see a fax from [facility].

## 2024-08-27 NOTE — Telephone Encounter (Signed)
 Caller Everlina) is following-up on fax sent regarding patient's beta blocker.

## 2024-08-27 NOTE — Therapy (Signed)
 " OUTPATIENT PHYSICAL THERAPY NEURO TREATMENT   Patient Name: Austin [name] MRN: [id number] DOB:08-Dec-1934, 89 y.o., male Today's Date: 08/27/2024   PCP: Merna Huxley, NP  REFERRING PROVIDER: Merna Huxley, NP     END OF SESSION:  PT End of Session - 08/27/24 0851     Visit Number 10    Number of Visits 25    Date for Recertification  10/18/24    Authorization Type [employer]-reauth submitted    Authorization Time Period 08/21/24-10/18/24    Authorization - Visit Number 2    Authorization - Number of Visits 16    Progress Note Due on Visit 19   PN done at visit 9   PT Start Time 0852    PT Stop Time 0930    PT Time Calculation (min) 38 min    Equipment Utilized During Treatment Gait belt    Activity Tolerance Patient tolerated treatment well    Behavior During Therapy [facility] for tasks assessed/performed            Past Medical History:  Diagnosis Date   Acute diastolic CHF (congestive heart failure) (HCC) 09/17/2020   Acute exacerbation of CHF (congestive heart failure) (HCC) 08/04/2019   Acute right PCA stroke (HCC) 04/25/2024   ANEMIA DUE TO CHRONIC BLOOD LOSS 03/13/2007   CAROTID ARTERY STENOSIS 05/10/2010   Coronary artery disease    DIABETES MELLITUS, TYPE II 09/19/2007   DISEASE, CEREBROVASCULAR NEC 03/05/2007   ESRD (end stage renal disease) on dialysis (HCC)    GERD 03/13/2007   HYPERLIPIDEMIA 03/05/2007   HYPERTENSION 03/05/2007   HYPOKALEMIA 11/09/2009   KNEE PAIN, RIGHT 11/09/2009   New onset a-fib (HCC) 04/27/2024   Eliquis    PROSTATE CANCER, HX OF 03/05/2007   Past Surgical History:  Procedure Laterality Date   A/V FISTULAGRAM N/A 03/21/2024   Procedure: A/V Fistulagram;  Surgeon: Magda Debby SAILOR, MD;  Location: [facility];  Service: Cardiovascular;  Laterality: N/A;   AV FISTULA PLACEMENT Left 10/18/2022   Procedure: INSERTION OF LEFT ARM BRACHIAL ARTERY TO AXILLARY VEIN ARTERIOVENOUS (AV) GORE-TEX GRAFT;  Surgeon: Lanis Fonda BRAVO, MD;   Location: [facility] OR;  Service: Vascular;  Laterality: Left;   CAROTID ARTERY ANGIOPLASTY Right 05/31/2000   ESOPHAGOGASTRODUODENOSCOPY (EGD) WITH PROPOFOL  N/A 11/04/2016   Procedure: ESOPHAGOGASTRODUODENOSCOPY (EGD) WITH PROPOFOL ;  Surgeon: Layla Lah, MD;  Location: [facility];  Service: Gastroenterology;  Laterality: N/A;   IR FLUORO GUIDE CV LINE RIGHT  10/25/2022   IR FLUORO GUIDE CV LINE RIGHT  10/27/2022   IR US  GUIDE VASC ACCESS RIGHT  10/25/2022   IR US  GUIDE VASC ACCESS RIGHT  10/27/2022   LAPAROSCOPIC APPENDECTOMY  02/20/2012   Procedure: APPENDECTOMY LAPAROSCOPIC;  Surgeon: Jina Nephew, MD;  Location: [facility] OR;  Service: General;  Laterality: N/A;   PROSTATE SURGERY     prostatectomy   Patient Active Problem List   Diagnosis Date Noted   New onset atrial fibrillation (HCC) 04/27/2024   Acute CVA (cerebrovascular accident) (HCC) 04/25/2024   Lumbar facet arthropathy 04/16/2024   Secondary hyperparathyroidism of renal origin 06/02/2023   Preoperative evaluation of a medical condition to rule out surgical contraindications (TAR required) 05/04/2023   Right carpal tunnel syndrome 03/16/2023   Coagulation defect, unspecified 11/07/2022   Personal history of nicotine dependence 11/01/2022   ESRD on dialysis (HCC) 10/29/2022   Protein-calorie malnutrition, severe 10/28/2022   Anemia in chronic kidney disease 10/28/2022   Hyperlipidemia, unspecified 10/28/2022   Pneumonia due to COVID-19 virus 10/25/2022  CKD (chronic kidney disease) stage 5, GFR less than 15 ml/min (HCC) 10/25/2022   Severe pulmonary hypertension (HCC) 10/25/2022   Moderate aortic regurgitation 10/25/2022   CAD in native artery 10/25/2022   UTI (urinary tract infection) 09/27/2021   CHF exacerbation (HCC) 09/26/2021   Acute respiratory failure with hypoxia (HCC) 09/26/2021   Acute on chronic diastolic CHF (congestive heart failure) (HCC) 07/09/2021   Dyspnea 06/01/2021   Upper airway cough syndrome  04/06/2021   Porokeratosis 03/26/2021   Pulmonary nodules 10/22/2020   Acute on chronic renal failure 10/09/2020   Mass of right lung 09/18/2020   Acute kidney injury superimposed on CKD 08/04/2019   Elevated troponin 08/04/2019   Hypokalemia 08/04/2019   Hypertensive urgency 03/28/2019   Elevated serum immunoglobulin free light chains 02/20/2019   Gastrointestinal hemorrhage with melena    Melena 11/03/2016   Carotid artery stenosis 05/10/2010   Type 2 diabetes mellitus with hyperlipidemia (HCC) 09/19/2007   Iron  deficiency anemia due to chronic blood loss 03/13/2007   GERD 03/13/2007   Essential hypertension 03/05/2007   DISEASE, CEREBROVASCULAR NEC 03/05/2007   PROSTATE CANCER, HX OF 03/05/2007    ONSET DATE: 04/25/24  REFERRING DIAG: I63.9 (ICD-10-CM) - Acute CVA (cerebrovascular accident) (HCC) R26.81 (ICD-10-CM) - Gait instability  THERAPY DIAG:  Muscle weakness (generalized)  Unsteadiness on feet  Other abnormalities of gait and mobility  Rationale for Evaluation and Treatment: Rehabilitation  SUBJECTIVE:                                                                                                                                                                                             SUBJECTIVE STATEMENT: No changes since last week.  Haven't gotten to the foot doctor yet, should be later this week.  Pt accompanied by: significant other and in waiting room  PERTINENT HISTORY: CHF, carotid artery stenosis, CAD, DMII, HLD, HTN, prostate CA, L AV fistula Dialysis: M, W, F  PAIN:  Are you having pain? Yes: NPRS scale: 5-6/10 Pain location: L foot Pain description: corn on bottom of foot Aggravating factors: putting weight on foot Relieving factors: Tylenol   PRECAUTIONS: Fall and Other: L AV fistula- no L UE BP   RED FLAGS: None   WEIGHT BEARING RESTRICTIONS: No  FALLS: Has patient fallen in last 6 months? No  LIVING ENVIRONMENT: Lives with: lives  with their spouse Lives in: House/apartment Stairs: 3-4 steps to enter with railing; 2 story home Has following equipment at home: Vannie - 2 wheeled, Environmental Consultant - 4 wheeled, Wheelchair (manual), and quad tip cane  PLOF: Independent with basic ADLs and wife was assisting with shower  transfers, cooking, cleaning   PATIENT GOALS: maybe a little balance; 08/21/2024  continue balance and lessen pain on L foot  OBJECTIVE:    TODAY'S TREATMENT: 08/27/2024 Activity Comments  NuStep, Level 3, 4 extremities x 6 minutes SPM >55 for dynamic warm up  Seated leg strengthening: March  2 x 10 LAQ 2 x 10 Ankle pumps 2  x 10 Hip abduction 2 x 10 Hip adduction 2 x10 BLE 2# No weight, then 2#-cues for TKE  2#  Hamstring curls 2 x 10 Seated, 5# at weight rack  Sit to stand 5 reps Throughout session with UE support             PATIENT EDUCATION: Education details: Updates to HEP to include seated exercises to do on days of dialysis or until he gets foot pain (from corn/callous) taken care of Person educated: Patient Education method: Explanation Education comprehension: verbalized understanding        HOME EXERCISE PROGRAM: Access Code: [email or web address] URL: [email or web address] Date: 08/27/2024 Prepared by: [facility] - Outpatient  Rehab - [facility]  Exercises - Heel Toe Raises with Counter Support  - 1 x daily - 4 x weekly - 2 sets - 10 reps - March in Place  - 1 x daily - 4 x weekly - 2 sets - 10 reps - Side Stepping with Counter Support  - 1 x daily - 4 x weekly - 1 sets - 3 reps - Standing Foot Lift on Box (BKA)  - 1 x daily - 7 x weekly - 1-3 sets - 10 reps - Seated Long Arc Quad  - 1 x daily - 3 x weekly - 3 sets - 10 reps - Seated March  - 1 x daily - 3 x weekly - 3 sets - 10 reps - Seated Heel Toe Raises  - 1 x daily - 7 x weekly - 3 sets - 10 reps     -------------------------------------------------------- Note: Objective measures were completed at  Evaluation unless otherwise noted.  DIAGNOSTIC FINDINGS:  04/25/24 brain MRI: Acute infarct in the right PCA territory as above. 2. 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 3. Additional remote lacunar infarcts in the left thalamus. 4. Focus of signal abnormality and susceptibility within the left parietal lobe, possibly representing remote hemorrhage or vascular malformation such as cavernous malformation. Additional similar focus of signal abnormality and susceptibility along the right dorsal aspect of the pons.  COGNITION: Overall cognitive status: difficult to discern; requires increased instruction for tasks occasionally    SENSATION: Pt reports N/T in UEs  COORDINATION: Alternating pronation/supination: slight dysmetria B Alternating toe tap: hypokinesia L>R Finger to nose: WNL B  MUSCLE TONE: WNL B LE  POSTURE: rounded shoulders, forward head, and shoulders elevations  LOWER EXTREMITY ROM:     Active  Right Eval Left Eval  Hip flexion    Hip extension    Hip abduction    Hip adduction    Hip internal rotation    Hip external rotation    Knee flexion    Knee extension    Ankle dorsiflexion 8 16  Ankle plantarflexion    Ankle inversion    Ankle eversion     (Blank rows = not tested)  LOWER EXTREMITY MMT:    MMT (in sitting) Right Eval Left Eval  Hip flexion 4+ 4+  Hip extension    Hip abduction 4- 4-  Hip adduction 4- 4-  Hip internal rotation  Hip external rotation    Knee flexion 3+ 3+  Knee extension 4+ 4  Ankle dorsiflexion 4 4  Ankle plantarflexion 4 4  Ankle inversion    Ankle eversion    (Blank rows = not tested)  GAIT: Findings: Assistive device utilized:quad tip cane, Level of assistance: SBA and CGA, and Comments: limited foot clearance B and tendency to shuffle feet; slowed *pt requires increased direction to navigate clinic environment; suspect vision vs. Cognition as a factor   FUNCTIONAL TESTS:   5 times sit to stand: 23.8 sec pushing off LEs without standing fully  10 meter walk test: 17.23 sec with cane (1.9 ft/sec)                                                                                                                                 TREATMENT DATE: 05/23/24    PATIENT EDUCATION: Education details: edu on benefits of OT for vision, prognosis, POC, exam findings as they relate for functional impairments  Person educated: Patient and Spouse Education method: Explanation Education comprehension: verbalized understanding  HOME EXERCISE PROGRAM: Not yet initiated    GOALS: Goals reviewed with patient? Yes  SHORT TERM GOALS: Target date: 06/13/2024>UPDATED TARGET 09/20/2024  Patient to be independent with initial HEP. Baseline: HEP initiated;pt requires cueing Goal status: partially MET, 08/21/2024 Pt will improve 5x sit<>stand to less than or equal to 25 sec to demonstrate improved functional strength and transfer efficiency. Baseline:  33 sec Goal status:  INITIAL, 08/21/2024    LONG TERM GOALS: Target date: 07/18/2024>UPDATED TARGET 10/18/2024  Patient to be independent with advanced HEP. Baseline:  Goal status: IN PROGRESS 08/21/2024  to be improve to at least 400 ft for improved gait efficiency and endurance. Baseline: 365 ft (NA 08/21/24 due to foot pain) Goal status: IN PROGRESS  Patient to score at least 15/24 on DGI in order to decrease risk of falls.  Baseline: 7>8/24 08/21/2024 Goal status: IN PROGRESS 08/21/2024  Patient to demonstrate 5xSTS test in <15 sec in order to decrease risk of falls.  Baseline: 23 sec>33 sec 08/21/2024 Goal status: IN PROGRESS   Patient to demonstrate gait speed of at least 2.3 ft/sec in order to improve access to community.  Baseline: 1.9 ft/sec; 1.05 ft/sec 08/21/2024 Goal status: IN PROGRESS   ASSESSMENT:  CLINICAL IMPRESSION: Pt presents today with no new complaints; still having foot pain from corn  (to be addressed at upcoming MD appt). Skilled PT session focused on lower extremity strengthening, mostly in sitting today, due to foot pain upon weightbearing.  Able to utilize 2# ankle weights as well as weight rack 5# weight.  Brief rest breaks and cues for form, but overall, pt tolerates well and reports fatigue at end of session.  Pt will continue to benefit from skilled PT towards goals for improved functional mobility and decreased fall risk.  OBJECTIVE IMPAIRMENTS: Abnormal gait, decreased activity tolerance, decreased balance, decreased coordination, decreased  endurance, difficulty walking, decreased ROM, decreased strength, decreased safety awareness, impaired flexibility, and postural dysfunction.   ACTIVITY LIMITATIONS: carrying, lifting, bending, sitting, standing, squatting, stairs, transfers, bed mobility, bathing, toileting, dressing, reach over head, hygiene/grooming, and locomotion level  PARTICIPATION LIMITATIONS: meal prep, cleaning, shopping, community activity, and church  PERSONAL FACTORS: Age, Fitness, Past/current experiences, Time since onset of injury/illness/exacerbation, and 3+ comorbidities: CHF, carotid artery stenosis, CAD, DMII, HLD, HTN, prostate CA, L AV fistula are also affecting patient's functional outcome.   REHAB POTENTIAL: Good  CLINICAL DECISION MAKING: Evolving/moderate complexity  EVALUATION COMPLEXITY: Moderate  PLAN:  PT FREQUENCY: 1-2x/week  PT DURATION: 8 weeks per recert 08/21/2024  PLANNED INTERVENTIONS: [id number]- PT Re-evaluation, 97750- Physical Performance Testing, 97110-Therapeutic exercises, 97530- Therapeutic activity, 97112- Neuromuscular re-education, 97535- Self Care, [id number]- Manual therapy, [id number]- Gait training, [phone]- Canalith repositioning, Patient/Family education, Balance training, Stair training, Taping, Vestibular training, DME instructions, Cryotherapy, and Moist heat  PLAN FOR NEXT SESSION: Review and progress HEP for BLE  strength and balance and update HEP as appropriate. Hopefully once pt has seen foot doctor, can work more on standing activities.  Faster walking w/ rollator (uses one on dialysis days)    Greig Anon, PT 08/27/2024 9:37 AM Phone: [phone] Fax: [phone]   [facility] at [facility] [address] [place], [place] [address] Phone # [phone] Fax # [phone]       "

## 2024-08-27 NOTE — Progress Notes (Signed)
 This patient returns to my office for at risk foot care.  This patient requires this care by a professional since this patient will be at risk due to having diabetic neuropathy and CKD   This patient says he has developed a painful callus on left forefoot which is painful walking and wearing his shoes.   This patient presents for at risk foot care today.  He presents to the office with his wife.  General Appearance  Alert, conversant and in no acute stress.  Vascular  Dorsalis pedis and posterior tibial  pulses are weakly  palpable  bilaterally.  Capillary return is within normal limits  bilaterally. Temperature is within normal limits  bilaterally.  Neurologic  Senn-Weinstein monofilament wire test diminished bilaterally. Muscle power within normal limits bilaterally.  Nails Thick disfigured discolored nails with subungual debris  from hallux to fifth toes bilaterally. No evidence of bacterial infection or drainage bilaterally.  Orthopedic  No limitations of motion  feet .  No crepitus or effusions noted.  No bony pathology or digital deformities noted.  HAV  B/L  Hammer toes  B/L. Plantar flexed third metatarsal left foot.  Skin  normotropic skin noted bilaterally.  No signs of infections or ulcers noted.   symptomatic callus sub 3,4 and 5 left foot.  Callus left forefoot.   Consent was obtained for treatment procedures.  Callus was identified under third metatarsal left foot.  The callus was debrided with # 15 blade and dremel tool.  Padding applied to shoe.    Return office visit    10 weeks                 Told patient to return for periodic foot care and evaluation due to potential at risk complications.   Cordella Bold DPM

## 2024-08-27 NOTE — Telephone Encounter (Signed)
 Called patient about provider out for 8:00 appointment but not able to leave VM d/t voicemail box full. Will attempt to contact patient via MyChart.    Louana Terrilyn Christians, PT, DPT 08/27/2024 5:45 AM  [facility] at [facility] [address] [place], [address] [place], [place] [address] Phone # [phone] Fax # [phone]

## 2024-08-28 NOTE — Therapy (Signed)
 " OUTPATIENT PHYSICAL THERAPY NEURO TREATMENT   Patient Name: Austin [name] MRN: [id number] DOB:1934-12-19, 89 y.o., male Today's Date: 08/29/2024   PCP: Merna Huxley, NP  REFERRING PROVIDER: Merna Huxley, NP     END OF SESSION:  PT End of Session - 08/29/24 0905     Visit Number 11    Number of Visits 25    Date for Recertification  10/18/24    Authorization Type [employer]-reauth submitted    Authorization Time Period approved 16 more PT visits from 08/21/24-10/18/24    Authorization - Visit Number 3    Authorization - Number of Visits 16    Progress Note Due on Visit 19   PN done at visit 9   PT Start Time 0848    PT Stop Time 0929    PT Time Calculation (min) 41 min    Equipment Utilized During Treatment Gait belt    Activity Tolerance Patient tolerated treatment well    Behavior During Therapy [facility] for tasks assessed/performed             Past Medical History:  Diagnosis Date   Acute diastolic CHF (congestive heart failure) (HCC) 09/17/2020   Acute exacerbation of CHF (congestive heart failure) (HCC) 08/04/2019   Acute right PCA stroke (HCC) 04/25/2024   ANEMIA DUE TO CHRONIC BLOOD LOSS 03/13/2007   CAROTID ARTERY STENOSIS 05/10/2010   Coronary artery disease    DIABETES MELLITUS, TYPE II 09/19/2007   DISEASE, CEREBROVASCULAR NEC 03/05/2007   ESRD (end stage renal disease) on dialysis (HCC)    GERD 03/13/2007   HYPERLIPIDEMIA 03/05/2007   HYPERTENSION 03/05/2007   HYPOKALEMIA 11/09/2009   KNEE PAIN, RIGHT 11/09/2009   New onset a-fib (HCC) 04/27/2024   Eliquis    PROSTATE CANCER, HX OF 03/05/2007   Past Surgical History:  Procedure Laterality Date   A/V FISTULAGRAM N/A 03/21/2024   Procedure: A/V Fistulagram;  Surgeon: Magda Debby SAILOR, MD;  Location: [facility];  Service: Cardiovascular;  Laterality: N/A;   AV FISTULA PLACEMENT Left 10/18/2022   Procedure: INSERTION OF LEFT ARM BRACHIAL ARTERY TO AXILLARY VEIN ARTERIOVENOUS (AV) GORE-TEX GRAFT;   Surgeon: Lanis Fonda BRAVO, MD;  Location: [facility] OR;  Service: Vascular;  Laterality: Left;   CAROTID ARTERY ANGIOPLASTY Right 05/31/2000   ESOPHAGOGASTRODUODENOSCOPY (EGD) WITH PROPOFOL  N/A 11/04/2016   Procedure: ESOPHAGOGASTRODUODENOSCOPY (EGD) WITH PROPOFOL ;  Surgeon: Layla Lah, MD;  Location: [facility];  Service: Gastroenterology;  Laterality: N/A;   IR FLUORO GUIDE CV LINE RIGHT  10/25/2022   IR FLUORO GUIDE CV LINE RIGHT  10/27/2022   IR US  GUIDE VASC ACCESS RIGHT  10/25/2022   IR US  GUIDE VASC ACCESS RIGHT  10/27/2022   LAPAROSCOPIC APPENDECTOMY  02/20/2012   Procedure: APPENDECTOMY LAPAROSCOPIC;  Surgeon: Jina Nephew, MD;  Location: [facility] OR;  Service: General;  Laterality: N/A;   PROSTATE SURGERY     prostatectomy   Patient Active Problem List   Diagnosis Date Noted   New onset atrial fibrillation (HCC) 04/27/2024   Acute CVA (cerebrovascular accident) (HCC) 04/25/2024   Lumbar facet arthropathy 04/16/2024   Secondary hyperparathyroidism of renal origin 06/02/2023   Preoperative evaluation of a medical condition to rule out surgical contraindications (TAR required) 05/04/2023   Right carpal tunnel syndrome 03/16/2023   Coagulation defect, unspecified 11/07/2022   Personal history of nicotine dependence 11/01/2022   ESRD on dialysis (HCC) 10/29/2022   Protein-calorie malnutrition, severe 10/28/2022   Anemia in chronic kidney disease 10/28/2022   Hyperlipidemia, unspecified 10/28/2022  Pneumonia due to COVID-19 virus 10/25/2022   CKD (chronic kidney disease) stage 5, GFR less than 15 ml/min (HCC) 10/25/2022   Severe pulmonary hypertension (HCC) 10/25/2022   Moderate aortic regurgitation 10/25/2022   CAD in native artery 10/25/2022   UTI (urinary tract infection) 09/27/2021   CHF exacerbation (HCC) 09/26/2021   Acute respiratory failure with hypoxia (HCC) 09/26/2021   Acute on chronic diastolic CHF (congestive heart failure) (HCC) 07/09/2021   Dyspnea 06/01/2021    Upper airway cough syndrome 04/06/2021   Porokeratosis 03/26/2021   Pulmonary nodules 10/22/2020   Acute on chronic renal failure 10/09/2020   Mass of right lung 09/18/2020   Acute kidney injury superimposed on CKD 08/04/2019   Elevated troponin 08/04/2019   Hypokalemia 08/04/2019   Hypertensive urgency 03/28/2019   Elevated serum immunoglobulin free light chains 02/20/2019   Gastrointestinal hemorrhage with melena    Melena 11/03/2016   Carotid artery stenosis 05/10/2010   Type 2 diabetes mellitus with hyperlipidemia (HCC) 09/19/2007   Iron  deficiency anemia due to chronic blood loss 03/13/2007   GERD 03/13/2007   Essential hypertension 03/05/2007   DISEASE, CEREBROVASCULAR NEC 03/05/2007   PROSTATE CANCER, HX OF 03/05/2007    ONSET DATE: 04/25/24  REFERRING DIAG: I63.9 (ICD-10-CM) - Acute CVA (cerebrovascular accident) (HCC) R26.81 (ICD-10-CM) - Gait instability  THERAPY DIAG:  Muscle weakness (generalized)  Unsteadiness on feet  Other abnormalities of gait and mobility  Rationale for Evaluation and Treatment: Rehabilitation  SUBJECTIVE:                                                                                                                                                                                             SUBJECTIVE STATEMENT: Doing okay besides feeling sleepy. Went to the foot doctor and he took care of that. Haven't gotten to the exercises- had 2 appointments.   Pt accompanied by: significant other and in waiting room  PERTINENT HISTORY: CHF, carotid artery stenosis, CAD, DMII, HLD, HTN, prostate CA, L AV fistula Dialysis: M, W, F  PAIN:  Are you having pain? Yes: NPRS scale: 0/10 Pain location: L foot Pain description: corn on bottom of foot Aggravating factors: putting weight on foot Relieving factors: Tylenol   PRECAUTIONS: Fall and Other: L AV fistula- no L UE BP   RED FLAGS: None   WEIGHT BEARING RESTRICTIONS: No  FALLS: Has patient  fallen in last 6 months? No  LIVING ENVIRONMENT: Lives with: lives with their spouse Lives in: House/apartment Stairs: 3-4 steps to enter with railing; 2 story home Has following equipment at home: Vannie - 2 wheeled, Environmental Consultant - 4 wheeled, Wheelchair (manual), and  quad tip cane  PLOF: Independent with basic ADLs and wife was assisting with shower transfers, cooking, cleaning   PATIENT GOALS: maybe a little balance; 08/21/2024  continue balance and lessen pain on L foot  OBJECTIVE:     TODAY'S TREATMENT: 08/30/23 Activity Comments  Nustep L4 x 6 min UEs/LE Dynamic warm up. Cueing to increase pace for last minute   Review of HEP: - Heel Toe Raises with Counter Support  - 1 x daily - 4 x weekly - 2 sets - 10 reps - March in Place  - 1 x daily - 4 x weekly - 2 sets - 10 reps - Side Stepping with Counter Support  - 1 x daily - 4 x weekly - 1 sets - 3 reps - Standing Foot Lift on Box (BKA)  - 1 x daily - 7 x weekly - 1-3 sets - 10 reps - Seated Long Arc Quad  - 1 x daily - 3 x weekly - 3 sets - 10 reps - Seated March  - 1 x daily - 3 x weekly - 3 sets - 10 reps - Seated Heel Toe Raises  - 1 x daily - 7 x weekly - 3 sets - 10 reps Requires demo and cueing for proper form and following along with exercises. Progressed to addition of 3# ankle weights with sitting ther-ex with good tolerance.  Pt did s good job with SLS activity and was able to perform without UE support with CGA  gait training with 4WW focusing on increasing pace x37ft Cueing for safe navigation around obstacles on L side, prompting to maintain quick speed. Pt reported fatigue after this activity                HOME EXERCISE PROGRAM Last updated: 08/29/24 Access Code: [email or web address] URL: [email or web address] Date: 08/29/2024 Prepared by: [facility] - [facility]  Exercises - Heel Toe Raises with Counter Support  - 1 x daily - 4 x weekly - 2 sets - 10 reps - March in Place  - 1 x daily -  4 x weekly - 2 sets - 10 reps - Side Stepping with Counter Support  - 1 x daily - 4 x weekly - 1 sets - 3 reps - Standing Foot Lift on Box (BKA)  - 1 x daily - 7 x weekly - 1-3 sets - 10 reps - Seated Long Arc Quad 3# - 1 x daily - 3 x weekly - 3 sets - 10 reps - Seated March 3# - 1 x daily - 3 x weekly - 3 sets - 10 reps - Seated Heel Toe Raises  - 1 x daily - 7 x weekly - 3 sets - 10 reps  PATIENT EDUCATION: Education details: HEP update to include 3lbs ankle weights  Person educated: Patient Education method: Explanation, Demonstration, Tactile cues, Verbal cues, and Handouts Education comprehension: verbalized understanding and returned demonstration     -------------------------------------------------------- Note: Objective measures were completed at Evaluation unless otherwise noted.  DIAGNOSTIC FINDINGS:  04/25/24 brain MRI: Acute infarct in the right PCA territory as above. 2. 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 3. Additional remote lacunar infarcts in the left thalamus. 4. Focus of signal abnormality and susceptibility within the left parietal lobe, possibly representing remote hemorrhage or vascular malformation such as cavernous malformation. Additional similar focus of signal abnormality and susceptibility along the right dorsal aspect of the pons.  COGNITION: Overall cognitive status: difficult  to discern; requires increased instruction for tasks occasionally    SENSATION: Pt reports N/T in UEs  COORDINATION: Alternating pronation/supination: slight dysmetria B Alternating toe tap: hypokinesia L>R Finger to nose: WNL B  MUSCLE TONE: WNL B LE  POSTURE: rounded shoulders, forward head, and shoulders elevations  LOWER EXTREMITY ROM:     Active  Right Eval Left Eval  Hip flexion    Hip extension    Hip abduction    Hip adduction    Hip internal rotation    Hip external rotation    Knee flexion    Knee extension     Ankle dorsiflexion 8 16  Ankle plantarflexion    Ankle inversion    Ankle eversion     (Blank rows = not tested)  LOWER EXTREMITY MMT:    MMT (in sitting) Right Eval Left Eval  Hip flexion 4+ 4+  Hip extension    Hip abduction 4- 4-  Hip adduction 4- 4-  Hip internal rotation    Hip external rotation    Knee flexion 3+ 3+  Knee extension 4+ 4  Ankle dorsiflexion 4 4  Ankle plantarflexion 4 4  Ankle inversion    Ankle eversion    (Blank rows = not tested)  GAIT: Findings: Assistive device utilized:quad tip cane, Level of assistance: SBA and CGA, and Comments: limited foot clearance B and tendency to shuffle feet; slowed *pt requires increased direction to navigate clinic environment; suspect vision vs. Cognition as a factor   FUNCTIONAL TESTS:  5 times sit to stand: 23.8 sec pushing off LEs without standing fully  10 meter walk test: 17.23 sec with cane (1.9 ft/sec)                                                                                                                                 TREATMENT DATE: 05/23/24    PATIENT EDUCATION: Education details: edu on benefits of OT for vision, prognosis, POC, exam findings as they relate for functional impairments  Person educated: Patient and Spouse Education method: Explanation Education comprehension: verbalized understanding  HOME EXERCISE PROGRAM: Not yet initiated    GOALS: Goals reviewed with patient? Yes  SHORT TERM GOALS: Target date: 06/13/2024>UPDATED TARGET 09/20/2024  Patient to be independent with initial HEP. Baseline: HEP initiated;pt requires cueing Goal status: partially MET, 08/21/2024 Pt will improve 5x sit<>stand to less than or equal to 25 sec to demonstrate improved functional strength and transfer efficiency. Baseline:  33 sec Goal status:  INITIAL, 08/21/2024    LONG TERM GOALS: Target date: 07/18/2024>UPDATED TARGET 10/18/2024  Patient to be independent with advanced  HEP. Baseline:  Goal status: IN PROGRESS 08/21/2024  to be improve to at least 400 ft for improved gait efficiency and endurance. Baseline: 365 ft (NA 08/21/24 due to foot pain) Goal status: IN PROGRESS  Patient to score at least 15/24 on DGI in order to decrease risk of falls.  Baseline:  7>8/24 08/21/2024 Goal status: IN PROGRESS 08/21/2024  Patient to demonstrate 5xSTS test in <15 sec in order to decrease risk of falls.  Baseline: 23 sec>33 sec 08/21/2024 Goal status: IN PROGRESS   Patient to demonstrate gait speed of at least 2.3 ft/sec in order to improve access to community.  Baseline: 1.9 ft/sec; 1.05 ft/sec 08/21/2024 Goal status: IN PROGRESS   ASSESSMENT:  CLINICAL IMPRESSION: Patient arrived to session without complaints besides trouble sleeping. Session focused on review of HEP for max understanding and carryover. Able to progress seated ther-ex with addition of ankle weights for increased challenge. Patient reports noncompliance with HEP d/t multiple medical appointments. Activities were performed safely today and patient demonstrated good tolerance despite c/o some fatigue at end of session. Patient tolerated session well and without complaints at end of appointment. OBJECTIVE IMPAIRMENTS: Abnormal gait, decreased activity tolerance, decreased balance, decreased coordination, decreased endurance, difficulty walking, decreased ROM, decreased strength, decreased safety awareness, impaired flexibility, and postural dysfunction.   ACTIVITY LIMITATIONS: carrying, lifting, bending, sitting, standing, squatting, stairs, transfers, bed mobility, bathing, toileting, dressing, reach over head, hygiene/grooming, and locomotion level  PARTICIPATION LIMITATIONS: meal prep, cleaning, shopping, community activity, and church  PERSONAL FACTORS: Age, Fitness, Past/current experiences, Time since onset of injury/illness/exacerbation, and 3+ comorbidities: CHF, carotid artery stenosis,  CAD, DMII, HLD, HTN, prostate CA, L AV fistula are also affecting patient's functional outcome.   REHAB POTENTIAL: Good  CLINICAL DECISION MAKING: Evolving/moderate complexity  EVALUATION COMPLEXITY: Moderate  PLAN:  PT FREQUENCY: 1-2x/week  PT DURATION: 8 weeks per recert 08/21/2024  PLANNED INTERVENTIONS: 97164- PT Re-evaluation, 97750- Physical Performance Testing, 97110-Therapeutic exercises, 97530- Therapeutic activity, 97112- Neuromuscular re-education, 97535- Self Care, 02859- Manual therapy, [id number]- Gait training, [id number]- Canalith repositioning, Patient/Family education, Balance training, Stair training, Taping, Vestibular training, DME instructions, Cryotherapy, and Moist heat  PLAN FOR NEXT SESSION: Review and progress HEP for BLE strength and balance and update HEP as appropriate. Hopefully once pt has seen foot doctor, can work more on standing activities.  Faster walking w/ rollator (uses one on dialysis days)    Louana Terrilyn Christians, PT, DPT 08/29/2024 9:31 AM  [facility] at [facility] [address] [place], [address] [place], [place] [address] Phone # [phone] Fax # [phone]    "

## 2024-08-29 ENCOUNTER — Other Ambulatory Visit: Payer: Self-pay | Admitting: Emergency Medicine

## 2024-08-29 ENCOUNTER — Ambulatory Visit: Admitting: Physical Therapy

## 2024-08-29 ENCOUNTER — Encounter: Payer: Self-pay | Admitting: Physical Therapy

## 2024-08-29 DIAGNOSIS — R2681 Unsteadiness on feet: Secondary | ICD-10-CM

## 2024-08-29 DIAGNOSIS — R2689 Other abnormalities of gait and mobility: Secondary | ICD-10-CM

## 2024-08-29 DIAGNOSIS — M6281 Muscle weakness (generalized): Secondary | ICD-10-CM

## 2024-08-29 NOTE — Telephone Encounter (Signed)
 Copied from CRM [id number]. Topic: Clinical - Medication Refill >> Aug 29, 2024  2:15 PM Isabell A wrote: Medication: benzonatate  (TESSALON ) 100 MG capsule [[id number]]  Has the patient contacted their pharmacy? Yes (Agent: If no, request that the patient contact the pharmacy for the refill. If patient does not wish to contact the pharmacy document the reason why and proceed with request.) (Agent: If yes, when and what did the pharmacy advise?)  This is the patient's preferred pharmacy:  [id number] - [place], [place] - [address] AT [facility] [address] [place] [address] Phone: [phone] Fax: [phone]\  Is this the correct pharmacy for this prescription? Yes If no, delete pharmacy and type the correct one.   Has the prescription been filled recently? Yes  Is the patient out of the medication? Yes  Has the patient been seen for an appointment in the last year OR does the patient have an upcoming appointment? Yes  Can we respond through MyChart? No  Agent: Please be advised that Rx refills may take up to 3 business days. We ask that you follow-up with your pharmacy.

## 2024-08-30 ENCOUNTER — Telehealth: Payer: Self-pay | Admitting: Emergency Medicine

## 2024-08-30 NOTE — Telephone Encounter (Signed)
 Fax received from [facility] to perform a dental extraction with lidocaine  and epi on patient.  Patient needs surgery clearance. Surgery is pending. Patient was seen on 07/25/24. Office protocol is a risk assessment can be sent to surgeon if patient has been seen in 60 days or less.   Sending to Dr Shelah for risk assessment or recommendations if patient needs to be seen in office prior to surgical procedure.

## 2024-09-02 NOTE — Telephone Encounter (Signed)
 I addended his note to indicate that he is low risk for this procedure. Thanks.

## 2024-09-02 NOTE — Progress Notes (Unsigned)
 " [facility] [address] [place] [address] Phone: [phone] Subjective:   Austin [name] am a scribe for Dr. Claudene.   I'm seeing this patient by the request  of:  Merna Huxley, NP  CC: Right wrist and neck pain  YEP:Dlagzrupcz  06/18/2024 Doing to see how patient responds to physical therapy.  Discussed icing regimen and home exercises as well.  No change in medications with patient being a relatively significant fall risk.  Already uses a cane to help with ambulation.  Follow-up with me again after trying physical therapy and if no further workup is necessary will discuss.     Patient has gone over a year without another injection at this time.  Discussed with patient about icing regimen and home exercises, discussed which activities to do and which ones to avoid.  Increase activity slowly.  Discussed icing regimen.  Follow-up again in 12 weeks     Update 09/03/2024 Austin [name] is a 89 y.o. male coming in with complaint of R wrist and LBP. Patient states that he is doing pretty well. Wrist is ok. He feels the pain in the back where he bruised it about a month ago from time to time.        Past Medical History:  Diagnosis Date   Acute diastolic CHF (congestive heart failure) (HCC) 09/17/2020   Acute exacerbation of CHF (congestive heart failure) (HCC) 08/04/2019   Acute right PCA stroke (HCC) 04/25/2024   ANEMIA DUE TO CHRONIC BLOOD LOSS 03/13/2007   CAROTID ARTERY STENOSIS 05/10/2010   Coronary artery disease    DIABETES MELLITUS, TYPE II 09/19/2007   DISEASE, CEREBROVASCULAR NEC 03/05/2007   ESRD (end stage renal disease) on dialysis (HCC)    GERD 03/13/2007   HYPERLIPIDEMIA 03/05/2007   HYPERTENSION 03/05/2007   HYPOKALEMIA 11/09/2009   KNEE PAIN, RIGHT 11/09/2009   New onset a-fib (HCC) 04/27/2024   Eliquis    PROSTATE CANCER, HX OF 03/05/2007   Past Surgical History:  Procedure Laterality Date   A/V FISTULAGRAM N/A  03/21/2024   Procedure: A/V Fistulagram;  Surgeon: Magda Debby SAILOR, MD;  Location: [facility];  Service: Cardiovascular;  Laterality: N/A;   AV FISTULA PLACEMENT Left 10/18/2022   Procedure: INSERTION OF LEFT ARM BRACHIAL ARTERY TO AXILLARY VEIN ARTERIOVENOUS (AV) GORE-TEX GRAFT;  Surgeon: Lanis Fonda BRAVO, MD;  Location: [facility];  Service: Vascular;  Laterality: Left;   CAROTID ARTERY ANGIOPLASTY Right 05/31/2000   ESOPHAGOGASTRODUODENOSCOPY (EGD) WITH PROPOFOL  N/A 11/04/2016   Procedure: ESOPHAGOGASTRODUODENOSCOPY (EGD) WITH PROPOFOL ;  Surgeon: Layla Lah, MD;  Location: [facility];  Service: Gastroenterology;  Laterality: N/A;   IR FLUORO GUIDE CV LINE RIGHT  10/25/2022   IR FLUORO GUIDE CV LINE RIGHT  10/27/2022   IR US  GUIDE VASC ACCESS RIGHT  10/25/2022   IR US  GUIDE VASC ACCESS RIGHT  10/27/2022   LAPAROSCOPIC APPENDECTOMY  02/20/2012   Procedure: APPENDECTOMY LAPAROSCOPIC;  Surgeon: Jina Nephew, MD;  Location: [facility] OR;  Service: General;  Laterality: N/A;   PROSTATE SURGERY     prostatectomy   Social History   Socioeconomic History   Marital status: Married    Spouse name: Not on file   Number of children: Not on file   Years of education: Not on file   Highest education level: Not on file  Occupational History   Not on file  Tobacco Use   Smoking status: Former    Current packs/day: 0.00    Average  packs/day: 1 pack/day for 10.0 years (10.0 ttl pk-yrs)    Types: Cigarettes    Start date: 08/22/1964    Quit date: 08/22/1974    Years since quitting: 50.0   Smokeless tobacco: Never  Vaping Use   Vaping status: Never Used  Substance and Sexual Activity   Alcohol  use: No    Alcohol /week: 0.0 standard drinks of alcohol    Drug use: No   Sexual activity: Not Currently  Other Topics Concern   Not on file  Social History Narrative   Retired - Administrator, [employer]    Married 53 years       He enjoys traveling    Social Drivers of Health   Tobacco Use: Medium Risk  (08/29/2024)   Patient History    Smoking Tobacco Use: Former    Smokeless Tobacco Use: Never    Passive Exposure: Not on Actuary Strain: Low Risk (02/09/2023)   Overall Financial Resource Strain (CARDIA)    Difficulty of Paying Living Expenses: Not very hard  Food Insecurity: No Food Insecurity (04/26/2024)   Epic    Worried About Radiation Protection Practitioner of Food in the Last Year: Never true    Ran Out of Food in the Last Year: Never true  Transportation Needs: No Transportation Needs (04/26/2024)   Epic    Lack of Transportation (Medical): No    Lack of Transportation (Non-Medical): No  Physical Activity: Insufficiently Active (02/09/2023)   Exercise Vital Sign    Days of Exercise per Week: 3 days    Minutes of Exercise per Session: 30 min  Stress: No Stress Concern Present (02/09/2023)   [employer] of Occupational Health - Occupational Stress Questionnaire    Feeling of Stress : Not at all  Social Connections: Unknown (04/26/2024)   Social Connection and Isolation Panel    Frequency of Communication with Friends and Family: Patient declined    Frequency of Social Gatherings with Friends and Family: Patient declined    Attends Religious Services: Patient declined    Active Member of Clubs or Organizations: Not on file    Attends Banker Meetings: Patient declined    Marital Status: Married  Depression (PHQ2-9): Low Risk (03/07/2024)   Depression (PHQ2-9)    PHQ-2 Score: 0  Alcohol  Screen: Low Risk (02/09/2023)   Alcohol  Screen    Last Alcohol  Screening Score (AUDIT): 0  Housing: Low Risk (04/26/2024)   Epic    Unable to Pay for Housing in the Last Year: No    Number of Times Moved in the Last Year: 0    Homeless in the Last Year: No  Utilities: Not At Risk (04/26/2024)   Epic    Threatened with loss of utilities: No  Health Literacy: Not on file   Allergies[1] Family History  Problem Relation Age of Onset   Hypertension Mother    Cancer Father         Mesothelioma    Stomach cancer Brother    Cancer Brother    Cancer - Cervical Brother    Cancer Brother    Diabetes Brother    Esophageal cancer Neg Hx    Colon cancer Neg Hx    Pancreatic cancer Neg Hx     Current Outpatient Medications (Cardiovascular):    amLODipine  (NORVASC ) 10 MG tablet, TAKE 1 TABLET BY MOUTH EVERY DAY   atorvastatin  (LIPITOR) 20 MG tablet, TAKE 1 TABLET BY MOUTH EVERY DAY   cloNIDine  (CATAPRES ) 0.2 MG tablet, Take 1 tablet (0.2  mg total) by mouth daily.   doxazosin  (CARDURA ) 2 MG tablet, Take 2 mg by mouth at bedtime.    isosorbide -hydrALAZINE  (BIDIL ) 20-37.5 MG tablet, Take 1 tablet by mouth 3 (three) times daily.  Current Outpatient Medications (Respiratory):    albuterol  (VENTOLIN  HFA) 108 (90 Base) MCG/ACT inhaler, Inhale 2 puffs into the lungs every 6 (six) hours as needed for wheezing or shortness of breath.   benzonatate  (TESSALON ) 100 MG capsule, TAKE 1 CAPSULE (100 MG TOTAL) BY MOUTH EVERY 6 (SIX) HOURS AS NEEDED FOR COUGH   cetirizine  (ZYRTEC ) 5 MG tablet, Take 1 tablet (5 mg total) by mouth at bedtime.   fluticasone  (FLONASE ) 50 MCG/ACT nasal spray, Place 1 spray into both nostrils daily.   guaiFENesin  (MUCINEX ) 600 MG 12 hr tablet, Take 600 mg by mouth at bedtime.  Current Outpatient Medications (Analgesics):    acetaminophen  (TYLENOL ) 500 MG tablet, Take 1,000 mg by mouth every 4 (four) hours as needed.  Current Outpatient Medications (Hematological):    apixaban  (ELIQUIS ) 2.5 MG TABS tablet, Take 1 tablet (2.5 mg total) by mouth 2 (two) times daily.   Methoxy PEG-Epoetin  Beta (MIRCERA IJ), Mircera  Current Outpatient Medications (Other):    Accu-Chek Softclix Lancets lancets, USED TO CHECK BLOOD GLUCOSE TWICE A DAY OR AS NEEDED   amoxicillin  (AMOXIL ) 875 MG tablet, Take 875 mg by mouth 2 (two) times daily.   cholecalciferol  (VITAMIN D3) 25 MCG (1000 UNIT) tablet, Take 1,000 Units by mouth daily.   esomeprazole  (NEXIUM ) 40 MG capsule, TAKE 1  CAPSULE BY MOUTH EVERY DAY   glucose blood (ACCU-CHEK GUIDE) test strip, USE TO CHECK BLOOD GLUCOSE TWICE A DAY OR AS NEEDED   ketoconazole  (NIZORAL ) 2 % cream, Apply 1 Application topically 2 (two) times daily.   lactulose  (CHRONULAC ) 10 GM/15ML solution, TAKE 30 ML BY MOUTH TWICE DAILY AS NEEDED   Melatonin 10 MG TABS, Take 10 mg by mouth at bedtime.   methylcellulose (CITRUCEL) oral powder, 1 tablespoon every 2-3 days,alternating with lactulose    Multiple Vitamins-Minerals (PRESERVISION AREDS 2) CAPS, Take 1 capsule by mouth daily.   Propylene Glycol (SYSTANE BALANCE OP), Place 1 drop into both eyes at bedtime.   sevelamer  carbonate (RENVELA ) 800 MG tablet, Take 800 mg by mouth 3 (three) times daily.     Review of Systems:  No headache, visual changes, nausea, vomiting, diarrhea, constipation, dizziness, abdominal pain, skin rash, fevers, chills, night sweats, weight loss, swollen lymph nodes, body aches, joint swelling, chest pain, shortness of breath, mood changes. POSITIVE muscle aches  Objective  Blood pressure (!) 142/60, pulse 98, height 5' 7 (1.702 m), weight 178 lb (80.7 kg), SpO2 94%.   General: No apparent distress alert and oriented x3 mood and affect normal, dressed appropriately.  HEENT: Pupils equal, extraocular movements intact  Respiratory: Patient's speak in full sentences and does not appear short of breath  Cardiovascular: No lower extremity edema, non tender, no erythema  Tightness noted in the back.  Walking with the aid of a cane.  Patient does have some resolving bruising noted on the backside.  No midline tenderness. Wrist exam has relatively good grip strength.  Negative Tinel's still at this time.    Impression and Recommendations:     The above documentation has been reviewed and is accurate and complete Arthea CHRISTELLA Sharps, DO       [1]  Allergies Allergen Reactions   Aspirin  Other (See Comments)    High doses causes stomach ulcer and bleeding   "

## 2024-09-03 ENCOUNTER — Ambulatory Visit: Payer: Self-pay

## 2024-09-03 ENCOUNTER — Ambulatory Visit: Admitting: Family Medicine

## 2024-09-03 ENCOUNTER — Ambulatory Visit

## 2024-09-03 VITALS — BP 142/60 | HR 98 | Ht 67.0 in | Wt 178.0 lb

## 2024-09-03 DIAGNOSIS — G5601 Carpal tunnel syndrome, right upper limb: Secondary | ICD-10-CM

## 2024-09-03 DIAGNOSIS — R2689 Other abnormalities of gait and mobility: Secondary | ICD-10-CM

## 2024-09-03 DIAGNOSIS — M25531 Pain in right wrist: Secondary | ICD-10-CM | POA: Diagnosis not present

## 2024-09-03 DIAGNOSIS — R262 Difficulty in walking, not elsewhere classified: Secondary | ICD-10-CM

## 2024-09-03 DIAGNOSIS — M6281 Muscle weakness (generalized): Secondary | ICD-10-CM

## 2024-09-03 DIAGNOSIS — R2681 Unsteadiness on feet: Secondary | ICD-10-CM

## 2024-09-03 NOTE — Therapy (Signed)
 " OUTPATIENT PHYSICAL THERAPY NEURO TREATMENT   Patient Name: Marcelles Clinard MRN: [id number] DOB:1935/05/14, 89 y.o., male Today's Date: 09/03/2024   PCP: Merna Huxley, NP  REFERRING PROVIDER: Merna Huxley, NP     END OF SESSION:  PT End of Session - 09/03/24 1411     Visit Number 12    Number of Visits 25    Date for Recertification  10/18/24    Authorization Type [employer]-reauth submitted    Authorization Time Period approved 16 more PT visits from 08/21/24-10/18/24    Authorization - Visit Number 4    Authorization - Number of Visits 16    Progress Note Due on Visit 19   PN done at visit 9   PT Start Time 1400    PT Stop Time 1445    PT Time Calculation (min) 45 min    Equipment Utilized During Treatment Gait belt    Activity Tolerance Patient tolerated treatment well    Behavior During Therapy [facility] for tasks assessed/performed             Past Medical History:  Diagnosis Date   Acute diastolic CHF (congestive heart failure) (HCC) 09/17/2020   Acute exacerbation of CHF (congestive heart failure) (HCC) 08/04/2019   Acute right PCA stroke (HCC) 04/25/2024   ANEMIA DUE TO CHRONIC BLOOD LOSS 03/13/2007   CAROTID ARTERY STENOSIS 05/10/2010   Coronary artery disease    DIABETES MELLITUS, TYPE II 09/19/2007   DISEASE, CEREBROVASCULAR NEC 03/05/2007   ESRD (end stage renal disease) on dialysis (HCC)    GERD 03/13/2007   HYPERLIPIDEMIA 03/05/2007   HYPERTENSION 03/05/2007   HYPOKALEMIA 11/09/2009   KNEE PAIN, RIGHT 11/09/2009   New onset a-fib (HCC) 04/27/2024   Eliquis    PROSTATE CANCER, HX OF 03/05/2007   Past Surgical History:  Procedure Laterality Date   A/V FISTULAGRAM N/A 03/21/2024   Procedure: A/V Fistulagram;  Surgeon: Magda Debby SAILOR, MD;  Location: [facility];  Service: Cardiovascular;  Laterality: N/A;   AV FISTULA PLACEMENT Left 10/18/2022   Procedure: INSERTION OF LEFT ARM BRACHIAL ARTERY TO AXILLARY VEIN ARTERIOVENOUS (AV) GORE-TEX GRAFT;   Surgeon: Lanis Fonda BRAVO, MD;  Location: [facility] OR;  Service: Vascular;  Laterality: Left;   CAROTID ARTERY ANGIOPLASTY Right 05/31/2000   ESOPHAGOGASTRODUODENOSCOPY (EGD) WITH PROPOFOL  N/A 11/04/2016   Procedure: ESOPHAGOGASTRODUODENOSCOPY (EGD) WITH PROPOFOL ;  Surgeon: Layla Lah, MD;  Location: [facility];  Service: Gastroenterology;  Laterality: N/A;   IR FLUORO GUIDE CV LINE RIGHT  10/25/2022   IR FLUORO GUIDE CV LINE RIGHT  10/27/2022   IR US  GUIDE VASC ACCESS RIGHT  10/25/2022   IR US  GUIDE VASC ACCESS RIGHT  10/27/2022   LAPAROSCOPIC APPENDECTOMY  02/20/2012   Procedure: APPENDECTOMY LAPAROSCOPIC;  Surgeon: Jina Nephew, MD;  Location: [facility] OR;  Service: General;  Laterality: N/A;   PROSTATE SURGERY     prostatectomy   Patient Active Problem List   Diagnosis Date Noted   New onset atrial fibrillation (HCC) 04/27/2024   Acute CVA (cerebrovascular accident) (HCC) 04/25/2024   Lumbar facet arthropathy 04/16/2024   Secondary hyperparathyroidism of renal origin 06/02/2023   Preoperative evaluation of a medical condition to rule out surgical contraindications (TAR required) 05/04/2023   Right carpal tunnel syndrome 03/16/2023   Coagulation defect, unspecified 11/07/2022   Personal history of nicotine dependence 11/01/2022   ESRD on dialysis (HCC) 10/29/2022   Protein-calorie malnutrition, severe 10/28/2022   Anemia in chronic kidney disease 10/28/2022   Hyperlipidemia, unspecified 10/28/2022  Pneumonia due to COVID-19 virus 10/25/2022   CKD (chronic kidney disease) stage 5, GFR less than 15 ml/min (HCC) 10/25/2022   Severe pulmonary hypertension (HCC) 10/25/2022   Moderate aortic regurgitation 10/25/2022   CAD in native artery 10/25/2022   UTI (urinary tract infection) 09/27/2021   CHF exacerbation (HCC) 09/26/2021   Acute respiratory failure with hypoxia (HCC) 09/26/2021   Acute on chronic diastolic CHF (congestive heart failure) (HCC) 07/09/2021   Dyspnea 06/01/2021    Upper airway cough syndrome 04/06/2021   Porokeratosis 03/26/2021   Pulmonary nodules 10/22/2020   Acute on chronic renal failure 10/09/2020   Mass of right lung 09/18/2020   Acute kidney injury superimposed on CKD 08/04/2019   Elevated troponin 08/04/2019   Hypokalemia 08/04/2019   Hypertensive urgency 03/28/2019   Elevated serum immunoglobulin free light chains 02/20/2019   Gastrointestinal hemorrhage with melena    Melena 11/03/2016   Carotid artery stenosis 05/10/2010   Type 2 diabetes mellitus with hyperlipidemia (HCC) 09/19/2007   Iron  deficiency anemia due to chronic blood loss 03/13/2007   GERD 03/13/2007   Essential hypertension 03/05/2007   DISEASE, CEREBROVASCULAR NEC 03/05/2007   PROSTATE CANCER, HX OF 03/05/2007    ONSET DATE: 04/25/24  REFERRING DIAG: I63.9 (ICD-10-CM) - Acute CVA (cerebrovascular accident) (HCC) R26.81 (ICD-10-CM) - Gait instability  THERAPY DIAG:  Muscle weakness (generalized)  Unsteadiness on feet  Other abnormalities of gait and mobility  Difficulty in walking, not elsewhere classified  Rationale for Evaluation and Treatment: Rehabilitation  SUBJECTIVE:                                                                                                                                                                                             SUBJECTIVE STATEMENT: Having some chest congestion and cough causing low energy. No fever or malaise/aches noted.  Pt accompanied by: significant other and in waiting room  PERTINENT HISTORY: CHF, carotid artery stenosis, CAD, DMII, HLD, HTN, prostate CA, L AV fistula Dialysis: M, W, F  PAIN:  Are you having pain? Yes: NPRS scale: 0/10 Pain location: L foot Pain description: corn on bottom of foot Aggravating factors: putting weight on foot Relieving factors: Tylenol   PRECAUTIONS: Fall and Other: L AV fistula- no L UE BP   RED FLAGS: None   WEIGHT BEARING RESTRICTIONS: No  FALLS: Has  patient fallen in last 6 months? No  LIVING ENVIRONMENT: Lives with: lives with their spouse Lives in: House/apartment Stairs: 3-4 steps to enter with railing; 2 story home Has following equipment at home: Vannie - 2 wheeled, Environmental Consultant - 4 wheeled, Wheelchair (manual), and quad tip cane  PLOF: Independent with basic ADLs and wife was assisting with shower transfers, cooking, cleaning   PATIENT GOALS: maybe a little balance; 08/21/2024  continue balance and lessen pain on L foot  OBJECTIVE:   TODAY'S TREATMENT: 09/03/24 Activity Comments  90%, 89-103 bpm walking into clinic Increased to 93% and 72 bpm with seated rest  NU-step LE only to enable O2 checks prn 2x4 min Level 4.  Vitals at 4 min: 90%, 90 bpm Vitals at 8 min: 93%, 92 bpm  Forward march/retrowalk x 2 min // bars--cues for step height forwards and stride length backwards --notes DOE end of round w/ 85% O2 and 103 bpm  Seated rest x 2 min 98%, 82 bpm  Static multisensory balance Mild sway  vitals 93% O2, 91 bpm       TODAY'S TREATMENT: 08/30/23 Activity Comments  Nustep L4 x 6 min UEs/LE Dynamic warm up. Cueing to increase pace for last minute   Review of HEP: - Heel Toe Raises with Counter Support  - 1 x daily - 4 x weekly - 2 sets - 10 reps - March in Place  - 1 x daily - 4 x weekly - 2 sets - 10 reps - Side Stepping with Counter Support  - 1 x daily - 4 x weekly - 1 sets - 3 reps - Standing Foot Lift on Box (BKA)  - 1 x daily - 7 x weekly - 1-3 sets - 10 reps - Seated Long Arc Quad  - 1 x daily - 3 x weekly - 3 sets - 10 reps - Seated March  - 1 x daily - 3 x weekly - 3 sets - 10 reps - Seated Heel Toe Raises  - 1 x daily - 7 x weekly - 3 sets - 10 reps Requires demo and cueing for proper form and following along with exercises. Progressed to addition of 3# ankle weights with sitting ther-ex with good tolerance.  Pt did s good job with SLS activity and was able to perform without UE support with CGA  gait training with  4WW focusing on increasing pace [id number] Cueing for safe navigation around obstacles on L side, prompting to maintain quick speed. Pt reported fatigue after this activity                HOME EXERCISE PROGRAM Last updated: 08/29/24 Access Code: [email or web address] URL: [email or web address] Date: 08/29/2024 Prepared by: [facility] - Outpatient  Rehab - [facility]  Exercises - Heel Toe Raises with Counter Support  - 1 x daily - 4 x weekly - 2 sets - 10 reps - March in Place  - 1 x daily - 4 x weekly - 2 sets - 10 reps - Side Stepping with Counter Support  - 1 x daily - 4 x weekly - 1 sets - 3 reps - Standing Foot Lift on Box (BKA)  - 1 x daily - 7 x weekly - 1-3 sets - 10 reps - Seated Long Arc Quad 3# - 1 x daily - 3 x weekly - 3 sets - 10 reps - Seated March 3# - 1 x daily - 3 x weekly - 3 sets - 10 reps - Seated Heel Toe Raises  - 1 x daily - 7 x weekly - 3 sets - 10 reps  PATIENT EDUCATION: Education details: HEP update to include 3lbs ankle weights  Person educated: Patient Education method: Explanation, Demonstration, Tactile cues, Verbal cues, and Handouts Education comprehension: verbalized understanding and returned demonstration     --------------------------------------------------------  Note: Objective measures were completed at Evaluation unless otherwise noted.  DIAGNOSTIC FINDINGS:  04/25/24 brain MRI: Acute infarct in the right PCA territory as above. 2. 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 3. Additional remote lacunar infarcts in the left thalamus. 4. Focus of signal abnormality and susceptibility within the left parietal lobe, possibly representing remote hemorrhage or vascular malformation such as cavernous malformation. Additional similar focus of signal abnormality and susceptibility along the right dorsal aspect of the pons.  COGNITION: Overall cognitive status: difficult to discern; requires increased  instruction for tasks occasionally    SENSATION: Pt reports N/T in UEs  COORDINATION: Alternating pronation/supination: slight dysmetria B Alternating toe tap: hypokinesia L>R Finger to nose: WNL B  MUSCLE TONE: WNL B LE  POSTURE: rounded shoulders, forward head, and shoulders elevations  LOWER EXTREMITY ROM:     Active  Right Eval Left Eval  Hip flexion    Hip extension    Hip abduction    Hip adduction    Hip internal rotation    Hip external rotation    Knee flexion    Knee extension    Ankle dorsiflexion 8 16  Ankle plantarflexion    Ankle inversion    Ankle eversion     (Blank rows = not tested)  LOWER EXTREMITY MMT:    MMT (in sitting) Right Eval Left Eval  Hip flexion 4+ 4+  Hip extension    Hip abduction 4- 4-  Hip adduction 4- 4-  Hip internal rotation    Hip external rotation    Knee flexion 3+ 3+  Knee extension 4+ 4  Ankle dorsiflexion 4 4  Ankle plantarflexion 4 4  Ankle inversion    Ankle eversion    (Blank rows = not tested)  GAIT: Findings: Assistive device utilized:quad tip cane, Level of assistance: SBA and CGA, and Comments: limited foot clearance B and tendency to shuffle feet; slowed *pt requires increased direction to navigate clinic environment; suspect vision vs. Cognition as a factor   FUNCTIONAL TESTS:  5 times sit to stand: 23.8 sec pushing off LEs without standing fully  10 meter walk test: 17.23 sec with cane (1.9 ft/sec)                                                                                                                                 TREATMENT DATE: 05/23/24    PATIENT EDUCATION: Education details: edu on benefits of OT for vision, prognosis, POC, exam findings as they relate for functional impairments  Person educated: Patient and Spouse Education method: Explanation Education comprehension: verbalized understanding  HOME EXERCISE PROGRAM: Not yet initiated    GOALS: Goals reviewed with patient?  Yes  SHORT TERM GOALS: Target date: 06/13/2024>UPDATED TARGET 09/20/2024  Patient to be independent with initial HEP. Baseline: HEP initiated;pt requires cueing Goal status: partially MET, 08/21/2024 Pt will improve 5x sit<>stand to less than or equal  to 25 sec to demonstrate improved functional strength and transfer efficiency. Baseline:  33 sec Goal status:  INITIAL, 08/21/2024    LONG TERM GOALS: Target date: 07/18/2024>UPDATED TARGET 10/18/2024  Patient to be independent with advanced HEP. Baseline:  Goal status: IN PROGRESS 08/21/2024  to be improve to at least 400 ft for improved gait efficiency and endurance. Baseline: 365 ft (NA 08/21/24 due to foot pain) Goal status: IN PROGRESS  Patient to score at least 15/24 on DGI in order to decrease risk of falls.  Baseline: 7>8/24 08/21/2024 Goal status: IN PROGRESS 08/21/2024  Patient to demonstrate 5xSTS test in <15 sec in order to decrease risk of falls.  Baseline: 23 sec>33 sec 08/21/2024 Goal status: IN PROGRESS   Patient to demonstrate gait speed of at least 2.3 ft/sec in order to improve access to community.  Baseline: 1.9 ft/sec; 1.05 ft/sec 08/21/2024 Goal status: IN PROGRESS   ASSESSMENT:  CLINICAL IMPRESSION: Reports chest congestion/productive cough and feeling decreased energy as result. Vitals monitored throughout session with instances of DOE and therapeutic rest breaks enacted throughout for vitals monitoring with good recovery to baseline over course of 1-2 min seated rest periods.  Communicated this to his PCP per request.  NU-step for sustained activity and for monitoring with good tolerance/minimal issue. Dynamic balance activities to improve single limb support and step height for clearnace of obstacles. Static multisensory balance to improve postural control and safety with ADL with mild sway appreciated and good recovery to 1 instance of LOB.  Continued sessions to progress POC details and for continued  monitoring with activity for adverse responses.   OBJECTIVE IMPAIRMENTS: Abnormal gait, decreased activity tolerance, decreased balance, decreased coordination, decreased endurance, difficulty walking, decreased ROM, decreased strength, decreased safety awareness, impaired flexibility, and postural dysfunction.   ACTIVITY LIMITATIONS: carrying, lifting, bending, sitting, standing, squatting, stairs, transfers, bed mobility, bathing, toileting, dressing, reach over head, hygiene/grooming, and locomotion level  PARTICIPATION LIMITATIONS: meal prep, cleaning, shopping, community activity, and church  PERSONAL FACTORS: Age, Fitness, Past/current experiences, Time since onset of injury/illness/exacerbation, and 3+ comorbidities: CHF, carotid artery stenosis, CAD, DMII, HLD, HTN, prostate CA, L AV fistula are also affecting patient's functional outcome.   REHAB POTENTIAL: Good  CLINICAL DECISION MAKING: Evolving/moderate complexity  EVALUATION COMPLEXITY: Moderate  PLAN:  PT FREQUENCY: 1-2x/week  PT DURATION: 8 weeks per recert 08/21/2024  PLANNED INTERVENTIONS: [id number]- PT Re-evaluation, 97750- Physical Performance Testing, 97110-Therapeutic exercises, 97530- Therapeutic activity, 97112- Neuromuscular re-education, 97535- Self Care, [id number]- Manual therapy, [id number]- Gait training, [phone]- Canalith repositioning, Patient/Family education, Balance training, Stair training, Taping, Vestibular training, DME instructions, Cryotherapy, and Moist heat  PLAN FOR NEXT SESSION: Review and progress HEP for BLE strength and balance and update HEP as appropriate. Hopefully once pt has seen foot doctor, can work more on standing activities.  Faster walking w/ rollator (uses one on dialysis days)    2:57 PM, 09/03/2024 M. Kelly Darren Caldron, PT, DPT Physical Therapist- [place] Office Number: [phone]     "

## 2024-09-03 NOTE — Assessment & Plan Note (Signed)
 Stable overall.  Discussed icing regimen and home exercises, discussed which activities to do and which ones to avoid.  Discussed bracing at night.  Follow-up again in 6 to 8 weeks

## 2024-09-03 NOTE — Patient Instructions (Addendum)
 Arnica lotion See you again in 10 weeks Good to see you!

## 2024-09-03 NOTE — Telephone Encounter (Signed)
 F/u   Reason for CRM: Arianna with the [facility] is requesting the patients surgical clearance to be faxed back to them at [phone]

## 2024-09-04 NOTE — Telephone Encounter (Signed)
 Copy of the addended note faxed to [facility]

## 2024-09-05 ENCOUNTER — Ambulatory Visit: Admitting: Occupational Therapy

## 2024-09-05 DIAGNOSIS — R2681 Unsteadiness on feet: Secondary | ICD-10-CM

## 2024-09-05 DIAGNOSIS — R278 Other lack of coordination: Secondary | ICD-10-CM

## 2024-09-05 DIAGNOSIS — M6281 Muscle weakness (generalized): Secondary | ICD-10-CM

## 2024-09-05 NOTE — Therapy (Signed)
 " OUTPATIENT OCCUPATIONAL THERAPY NEURO  Treatment Note  Patient Name: Austin [name] MRN: [id number] DOB:1935-01-27, 89 y.o., male Today's Date: 09/05/2024  PCP: Merna Huxley, NP REFERRING PROVIDER: Merna Huxley, NP  END OF SESSION:  OT End of Session - 09/05/24 0942     Visit Number 10    Number of Visits 13    Date for Recertification  09/27/24    Authorization Type [employer] 2025    Authorization Time Period Auth#: [id number], approved 8 OT visits from 08/21/24-09/21/24.    Authorization - Visit Number 2    Authorization - Number of Visits 8    OT Start Time [phone]    OT Stop Time 1018    OT Time Calculation (min) 40 min    Behavior During Therapy [facility] for tasks assessed/performed                 Past Medical History:  Diagnosis Date   Acute diastolic CHF (congestive heart failure) (HCC) 09/17/2020   Acute exacerbation of CHF (congestive heart failure) (HCC) 08/04/2019   Acute right PCA stroke (HCC) 04/25/2024   ANEMIA DUE TO CHRONIC BLOOD LOSS 03/13/2007   CAROTID ARTERY STENOSIS 05/10/2010   Coronary artery disease    DIABETES MELLITUS, TYPE II 09/19/2007   DISEASE, CEREBROVASCULAR NEC 03/05/2007   ESRD (end stage renal disease) on dialysis (HCC)    GERD 03/13/2007   HYPERLIPIDEMIA 03/05/2007   HYPERTENSION 03/05/2007   HYPOKALEMIA 11/09/2009   KNEE PAIN, RIGHT 11/09/2009   New onset a-fib (HCC) 04/27/2024   Eliquis    PROSTATE CANCER, HX OF 03/05/2007   Past Surgical History:  Procedure Laterality Date   A/V FISTULAGRAM N/A 03/21/2024   Procedure: A/V Fistulagram;  Surgeon: Magda Debby SAILOR, MD;  Location: [facility];  Service: Cardiovascular;  Laterality: N/A;   AV FISTULA PLACEMENT Left 10/18/2022   Procedure: INSERTION OF LEFT ARM BRACHIAL ARTERY TO AXILLARY VEIN ARTERIOVENOUS (AV) GORE-TEX GRAFT;  Surgeon: Lanis Fonda BRAVO, MD;  Location: [facility] OR;  Service: Vascular;  Laterality: Left;   CAROTID ARTERY ANGIOPLASTY Right 05/31/2000    ESOPHAGOGASTRODUODENOSCOPY (EGD) WITH PROPOFOL  N/A 11/04/2016   Procedure: ESOPHAGOGASTRODUODENOSCOPY (EGD) WITH PROPOFOL ;  Surgeon: Layla Lah, MD;  Location: [facility];  Service: Gastroenterology;  Laterality: N/A;   IR FLUORO GUIDE CV LINE RIGHT  10/25/2022   IR FLUORO GUIDE CV LINE RIGHT  10/27/2022   IR US  GUIDE VASC ACCESS RIGHT  10/25/2022   IR US  GUIDE VASC ACCESS RIGHT  10/27/2022   LAPAROSCOPIC APPENDECTOMY  02/20/2012   Procedure: APPENDECTOMY LAPAROSCOPIC;  Surgeon: Jina Nephew, MD;  Location: [facility] OR;  Service: General;  Laterality: N/A;   PROSTATE SURGERY     prostatectomy   Patient Active Problem List   Diagnosis Date Noted   New onset atrial fibrillation (HCC) 04/27/2024   Acute CVA (cerebrovascular accident) (HCC) 04/25/2024   Lumbar facet arthropathy 04/16/2024   Secondary hyperparathyroidism of renal origin 06/02/2023   Preoperative evaluation of a medical condition to rule out surgical contraindications (TAR required) 05/04/2023   Right carpal tunnel syndrome 03/16/2023   Coagulation defect, unspecified 11/07/2022   Personal history of nicotine dependence 11/01/2022   ESRD on dialysis (HCC) 10/29/2022   Protein-calorie malnutrition, severe 10/28/2022   Anemia in chronic kidney disease 10/28/2022   Hyperlipidemia, unspecified 10/28/2022   Pneumonia due to COVID-19 virus 10/25/2022   CKD (chronic kidney disease) stage 5, GFR less than 15 ml/min (HCC) 10/25/2022   Severe pulmonary hypertension (HCC) 10/25/2022   Moderate aortic  regurgitation 10/25/2022   CAD in native artery 10/25/2022   UTI (urinary tract infection) 09/27/2021   CHF exacerbation (HCC) 09/26/2021   Acute respiratory failure with hypoxia (HCC) 09/26/2021   Acute on chronic diastolic CHF (congestive heart failure) (HCC) 07/09/2021   Dyspnea 06/01/2021   Upper airway cough syndrome 04/06/2021   Porokeratosis 03/26/2021   Pulmonary nodules 10/22/2020   Acute on chronic renal failure  10/09/2020   Mass of right lung 09/18/2020   Acute kidney injury superimposed on CKD 08/04/2019   Elevated troponin 08/04/2019   Hypokalemia 08/04/2019   Hypertensive urgency 03/28/2019   Elevated serum immunoglobulin free light chains 02/20/2019   Gastrointestinal hemorrhage with melena    Melena 11/03/2016   Carotid artery stenosis 05/10/2010   Type 2 diabetes mellitus with hyperlipidemia (HCC) 09/19/2007   Iron  deficiency anemia due to chronic blood loss 03/13/2007   GERD 03/13/2007   Essential hypertension 03/05/2007   DISEASE, CEREBROVASCULAR NEC 03/05/2007   PROSTATE CANCER, HX OF 03/05/2007    ONSET DATE: referral date 05/24/24  REFERRING DIAG: H53.9 (ICD-10-CM) - Visual disturbance  THERAPY DIAG:  Muscle weakness (generalized)  Other lack of coordination  Unsteadiness on feet  Rationale for Evaluation and Treatment: Rehabilitation  SUBJECTIVE:   SUBJECTIVE STATEMENT: Pt reporting that he has been trying to get connected with military benefits to aid in resources and DME to increase safety in home (such as stair lift, shower seat).    Pt accompanied by: self  PERTINENT HISTORY: Admitted to [facility] on 9/4 with visual changes over the last 4 days. MRI reveals acute R PCA infarct and remote R basal ganglia infarct with evidence of prior hemorrhage.   H/o: CHF, carotid artery stenosis, CAD, DMII, HLD, HTN, prostate CA, L AV fistula Dialysis: M, W, F  PRECAUTIONS: Fall and Other: L AV fistula- no L UE BP   WEIGHT BEARING RESTRICTIONS: No  PAIN:  Are you having pain? No  FALLS: Has patient fallen in last 6 months? No  LIVING ENVIRONMENT: Lives with: lives with their spouse Lives in: House/apartment Stairs: 3-4 steps to enter with railing; 2 story home with bedroom/full bath on the second floor Has following equipment at home: Vannie - 2 wheeled, Environmental Consultant - 4 wheeled, Wheelchair (manual), and quad tip cane  PLOF: Independent with basic ADLs and wife was assisting  with shower transfers, cooking, cleaning   PATIENT GOALS: strategies to aid in vision; more energy to do things  OBJECTIVE:  Note: Objective measures were completed at Evaluation unless otherwise noted.  HAND DOMINANCE: Right  ADLs: Transfers/ambulation related to ADLs: using quad tip cane and cues for L attention Eating: Mod I Grooming: some difficulty with shaving, but I manage UB Dressing: Mod I LB Dressing: Mod I Toileting: Mod I Bathing: occasional assist with washing back Tub Shower transfers: very difficult to get out of garden tub  IADLs: Shopping: wife completes Light housekeeping: wife completes Meal Prep: would make breakfast prior to stroke, but has not since Community mobility: not driving Medication management: spouse administers meds, has used a pill box in the past Financial management: wife completes  MOBILITY STATUS: utilizing quad tip cane   POSTURE COMMENTS:  rounded shoulders, forward head, and shoulders elevations   ACTIVITY TOLERANCE: Activity tolerance: reports decreased activity tolerance  UPPER EXTREMITY ROM:  WFL bilaterally   UPPER EXTREMITY MMT:   4+5 overall   COORDINATION: 9 Hole Peg test: Right: 47.44 sec; Left: 58.57 sec - pt picking up additional pegs to place due  to decreased visual attention and looking for additional holes  SENSATION: WFL  COGNITION: Overall cognitive status: spouse reports that he gets confused more since the stroke than before  VISION: Subjective report: decreased attention to L Baseline vision: Bifocals and Wears glasses all the time Visual history: cataracts, had those removed  VISION ASSESSMENT: Gaze preference/alignment: WDL Tracking/Visual pursuits: Decreased smoothness with horizontal tracking Saccades: undershoots, decreased speed of saccadic movements, and decreased scanning to L visual field Visual Fields: Left visual field deficits Confrontation testing: pt unable to identify stimulus  in Left visual field with or without stimulus on Right Line bisection: pt correctly bisecting 4/6 lines, verbally stating where is the end of the line and turning head to scan full paper in 2 instances.  Patient has difficulty with following activities due to following visual impairments: reading from medical list as pt frequently omitting words on far left  PERCEPTION: Impaired: Inattention/neglect: does not attend to left visual field  OBSERVATIONS: Pt requiring verbal cues for visual attention and scanning to Left while navigating through treatment space as pt initially not able to see treatment room on Left side of gym.                                                                                                                             TREATMENT DATE:  09/05/24 Self-care: educated on DME for tub/shower transfers, providing pictures of both shower chair and tub transfer bench.  OT providing verbal and written recommendations with pros and cons of each.  Pt would benefit most for tub transfer bench to increase safety and independence with transfer.  OT educated on how to position shower curtain to decrease water spillage to floor. OT showed pt video of shower setup with bench and shower curtain.  Engaged in simulated tub/shower transfer with pt transferring to tub bench with close supervision and min cues for technique.  OT providing setup for curtain to facilitate carryover.   Engaged in conversation about military benefits as pt reporting that he has attempted to get set up, but has not been able to complete the process. OT encouraged pt and spouse to follow up on that as he may qualify for services and/or discounts on equipment and/or house modifications.     87/68/74 Re-cert completed this date. Re-took 9HPT and completed visual scanning in busy environment with 100% accuracy. Discussed tub/shower transfers, pt reports that they are going to obtain either a tub bench or shower chair  and grab bar for improving his independence with bathing. Pt agreed to continuing tx to allow for practicing transfers with AE. Pt completed peg board pattern with 100% accuracy.    07/17/24 Visual attention/scanning: engaged in large grip peg board pattern replication on vertical surface to challenge standing balance and endurance as well as arousal.  Pegs and pattern placed to L to challenge visual attention to L environment and then completing pattern replication at midline.   Visual scanning: engaged in  scanning magnetic board on vertical surface to identify letters in mixed letters to spell pt's first name.  Pt initially verbalizing difficulty with identifying letters, however once oriented to board pt then able to scan to locate all letters with min to no difficulty.  Transitioned board to table top to assess vision, pt with similar results on table top compared to vertical.   Trail making: Completed Trail making A in 3:20.  Pt demonstrating increased difficulty when scanning to locate numbers on L side of paper, but with good sequencing and pacing when attending to numbers at midline and right side of paper.      PATIENT EDUCATION: Education details: DME and military benefits for equipment/resources Person educated: Patient and Spouse Education method: Explanation, Verbal cues, and Handouts Education comprehension: verbalized understanding and needs further education  HOME EXERCISE PROGRAM: Visual spatial awareness - see pt instructions   GOALS: Goals reviewed with patient? Yes   NEW SHORT TERM GOALS:    1. Pt will be educated in safe transfer techniques for tub/shower chair/bench   Baseline: AE not arrived yet   Goal status: in progress  NEW LONG TERM GOALS:   Pt will demonstrate improved fine motor coordination for ADLs and visual attention to Left visual field as evidenced by decreasing 9 hole peg test score for BUE by 5 secs on RUE.  Baseline: Right: 47.44 sec; Left: 58.57  sec 08/21/24: Right: 46.69 sec; Left 48 sec Goal status:  in progress    2. Pt will report successful Mod I transfer in/out of tub/shower with AE   Baseline: AE not installed yet   Goal status:  in progress  ASSESSMENT:  CLINICAL IMPRESSION: Patient is a 89 y.o. male who was seen today for occupational therapy treatment for visual deficits s/p CVA. Pt demonstrating good understanding of benefits of DME for increased safety and ease with transfers in/out of tub/shower.  Pt will benefit from follow up with military benefits to facilitate increased affordability of DME for bathroom.  Pt also reporting potential desire to have bathroom modifications completed.  Pt more alert this session, still benefiting from intermittent cues for arousal and cues for L attention.  Pt will continue to benefit from skilled OT services in the outpatient setting to work towards remaining goals or until max rehab potential is met.     PERFORMANCE DEFICITS: in functional skills including ADLs, IADLs, Fine motor control, balance, body mechanics, endurance, decreased knowledge of precautions, decreased knowledge of use of DME, and vision, cognitive skills including memory and safety awareness, and psychosocial skills including coping strategies, environmental adaptation, and routines and behaviors.    PLAN:  OT FREQUENCY: 1x/week  plus today's visit  OT DURATION: 4 weeks  PLANNED INTERVENTIONS: 97168 OT Re-evaluation, 97535 self care/ADL training, 02889 therapeutic exercise, 97530 therapeutic activity, 97112 neuromuscular re-education, functional mobility training, visual/perceptual remediation/compensation, psychosocial skills training, energy conservation, coping strategies training, patient/family education, and DME and/or AE instructions  RECOMMENDED OTHER SERVICES: NA  CONSULTED AND AGREED WITH PLAN OF CARE: Patient and family member/caregiver  PLAN FOR NEXT SESSION: Tub transfer bench/chair transfer  education  Coordination activities/HEP prn    KAYLENE DOMINO, OTR/L 09/05/2024, 10:42 AM   [facility] at [facility] [address], [address] [place], [place] [address] Phone # [phone] Fax # [phone]         "

## 2024-09-10 ENCOUNTER — Ambulatory Visit

## 2024-09-10 ENCOUNTER — Other Ambulatory Visit: Payer: Self-pay | Admitting: Adult Health

## 2024-09-10 DIAGNOSIS — R262 Difficulty in walking, not elsewhere classified: Secondary | ICD-10-CM

## 2024-09-10 DIAGNOSIS — R2689 Other abnormalities of gait and mobility: Secondary | ICD-10-CM

## 2024-09-10 DIAGNOSIS — R2681 Unsteadiness on feet: Secondary | ICD-10-CM

## 2024-09-10 DIAGNOSIS — M6281 Muscle weakness (generalized): Secondary | ICD-10-CM

## 2024-09-10 NOTE — Telephone Encounter (Unsigned)
 Copied from CRM [id number]. Topic: Clinical - Medication Refill >> Sep 10, 2024 12:23 PM Tysheama G wrote: Medication: benzonatate  (TESSALON ) 100 MG capsule  Has the patient contacted their pharmacy? No (Agent: If no, request that the patient contact the pharmacy for the refill. If patient does not wish to contact the pharmacy document the reason why and proceed with request.) (Agent: If yes, when and what did the pharmacy advise?)  This is the patient's preferred pharmacy:  [id number] - [place], [place] - [address] AT [facility] [address] [place] [address] Phone: [phone] Fax: [phone]    Is this the correct pharmacy for this prescription? Yes If no, delete pharmacy and type the correct one.   Has the prescription been filled recently? No  Is the patient out of the medication? Yes  Has the patient been seen for an appointment in the last year OR does the patient have an upcoming appointment? Yes  Can we respond through MyChart? No  Agent: Please be advised that Rx refills may take up to 3 business days. We ask that you follow-up with your pharmacy.

## 2024-09-10 NOTE — Therapy (Signed)
 " OUTPATIENT PHYSICAL THERAPY NEURO TREATMENT   Patient Name: Austin [name] MRN: [id number] DOB:1935/05/13, 89 y.o., male Today's Date: 09/10/2024   PCP: Merna Huxley, NP  REFERRING PROVIDER: Merna Huxley, NP     END OF SESSION:  PT End of Session - 09/10/24 0927     Visit Number 13    Number of Visits 25    Date for Recertification  10/18/24    Authorization Type [employer]-reauth submitted    Authorization Time Period approved 16 more PT visits from 08/21/24-10/18/24    Authorization - Visit Number 5    Authorization - Number of Visits 16    Progress Note Due on Visit 19   PN done at visit 9   PT Start Time 0930    PT Stop Time 1015    PT Time Calculation (min) 45 min    Equipment Utilized During Treatment Gait belt    Activity Tolerance Patient tolerated treatment well    Behavior During Therapy [facility] for tasks assessed/performed             Past Medical History:  Diagnosis Date   Acute diastolic CHF (congestive heart failure) (HCC) 09/17/2020   Acute exacerbation of CHF (congestive heart failure) (HCC) 08/04/2019   Acute right PCA stroke (HCC) 04/25/2024   ANEMIA DUE TO CHRONIC BLOOD LOSS 03/13/2007   CAROTID ARTERY STENOSIS 05/10/2010   Coronary artery disease    DIABETES MELLITUS, TYPE II 09/19/2007   DISEASE, CEREBROVASCULAR NEC 03/05/2007   ESRD (end stage renal disease) on dialysis (HCC)    GERD 03/13/2007   HYPERLIPIDEMIA 03/05/2007   HYPERTENSION 03/05/2007   HYPOKALEMIA 11/09/2009   KNEE PAIN, RIGHT 11/09/2009   New onset a-fib (HCC) 04/27/2024   Eliquis    PROSTATE CANCER, HX OF 03/05/2007   Past Surgical History:  Procedure Laterality Date   A/V FISTULAGRAM N/A 03/21/2024   Procedure: A/V Fistulagram;  Surgeon: Magda Debby SAILOR, MD;  Location: [facility];  Service: Cardiovascular;  Laterality: N/A;   AV FISTULA PLACEMENT Left 10/18/2022   Procedure: INSERTION OF LEFT ARM BRACHIAL ARTERY TO AXILLARY VEIN ARTERIOVENOUS (AV) GORE-TEX GRAFT;   Surgeon: Lanis Fonda BRAVO, MD;  Location: [facility] OR;  Service: Vascular;  Laterality: Left;   CAROTID ARTERY ANGIOPLASTY Right 05/31/2000   ESOPHAGOGASTRODUODENOSCOPY (EGD) WITH PROPOFOL  N/A 11/04/2016   Procedure: ESOPHAGOGASTRODUODENOSCOPY (EGD) WITH PROPOFOL ;  Surgeon: Layla Lah, MD;  Location: [facility];  Service: Gastroenterology;  Laterality: N/A;   IR FLUORO GUIDE CV LINE RIGHT  10/25/2022   IR FLUORO GUIDE CV LINE RIGHT  10/27/2022   IR US  GUIDE VASC ACCESS RIGHT  10/25/2022   IR US  GUIDE VASC ACCESS RIGHT  10/27/2022   LAPAROSCOPIC APPENDECTOMY  02/20/2012   Procedure: APPENDECTOMY LAPAROSCOPIC;  Surgeon: Jina Nephew, MD;  Location: [facility] OR;  Service: General;  Laterality: N/A;   PROSTATE SURGERY     prostatectomy   Patient Active Problem List   Diagnosis Date Noted   New onset atrial fibrillation (HCC) 04/27/2024   Acute CVA (cerebrovascular accident) (HCC) 04/25/2024   Lumbar facet arthropathy 04/16/2024   Secondary hyperparathyroidism of renal origin 06/02/2023   Preoperative evaluation of a medical condition to rule out surgical contraindications (TAR required) 05/04/2023   Right carpal tunnel syndrome 03/16/2023   Coagulation defect, unspecified 11/07/2022   Personal history of nicotine dependence 11/01/2022   ESRD on dialysis (HCC) 10/29/2022   Protein-calorie malnutrition, severe 10/28/2022   Anemia in chronic kidney disease 10/28/2022   Hyperlipidemia, unspecified 10/28/2022  Pneumonia due to COVID-19 virus 10/25/2022   CKD (chronic kidney disease) stage 5, GFR less than 15 ml/min (HCC) 10/25/2022   Severe pulmonary hypertension (HCC) 10/25/2022   Moderate aortic regurgitation 10/25/2022   CAD in native artery 10/25/2022   UTI (urinary tract infection) 09/27/2021   CHF exacerbation (HCC) 09/26/2021   Acute respiratory failure with hypoxia (HCC) 09/26/2021   Acute on chronic diastolic CHF (congestive heart failure) (HCC) 07/09/2021   Dyspnea 06/01/2021    Upper airway cough syndrome 04/06/2021   Porokeratosis 03/26/2021   Pulmonary nodules 10/22/2020   Acute on chronic renal failure 10/09/2020   Mass of right lung 09/18/2020   Acute kidney injury superimposed on CKD 08/04/2019   Elevated troponin 08/04/2019   Hypokalemia 08/04/2019   Hypertensive urgency 03/28/2019   Elevated serum immunoglobulin free light chains 02/20/2019   Gastrointestinal hemorrhage with melena    Melena 11/03/2016   Carotid artery stenosis 05/10/2010   Type 2 diabetes mellitus with hyperlipidemia (HCC) 09/19/2007   Iron  deficiency anemia due to chronic blood loss 03/13/2007   GERD 03/13/2007   Essential hypertension 03/05/2007   DISEASE, CEREBROVASCULAR NEC 03/05/2007   PROSTATE CANCER, HX OF 03/05/2007    ONSET DATE: 04/25/24  REFERRING DIAG: I63.9 (ICD-10-CM) - Acute CVA (cerebrovascular accident) (HCC) R26.81 (ICD-10-CM) - Gait instability  THERAPY DIAG:  Muscle weakness (generalized)  Unsteadiness on feet  Other abnormalities of gait and mobility  Difficulty in walking, not elsewhere classified  Rationale for Evaluation and Treatment: Rehabilitation  SUBJECTIVE:                                                                                                                                                                                             SUBJECTIVE STATEMENT: Feeling better from the other day. No more DOE of note, the foot is slowly healing  Pt accompanied by: significant other and in waiting room  PERTINENT HISTORY: CHF, carotid artery stenosis, CAD, DMII, HLD, HTN, prostate CA, L AV fistula Dialysis: M, W, F  PAIN:  Are you having pain? Yes: NPRS scale: 0/10 Pain location: L foot Pain description: corn on bottom of foot Aggravating factors: putting weight on foot Relieving factors: Tylenol   PRECAUTIONS: Fall and Other: L AV fistula- no L UE BP   RED FLAGS: None   WEIGHT BEARING RESTRICTIONS: No  FALLS: Has patient fallen  in last 6 months? No  LIVING ENVIRONMENT: Lives with: lives with their spouse Lives in: House/apartment Stairs: 3-4 steps to enter with railing; 2 story home Has following equipment at home: Vannie - 2 wheeled, Environmental Consultant - 4 wheeled, Wheelchair (manual), and quad tip  cane  PLOF: Independent with basic ADLs and wife was assisting with shower transfers, cooking, cleaning   PATIENT GOALS: maybe a little balance; 08/21/2024  continue balance and lessen pain on L foot  OBJECTIVE:   TODAY'S TREATMENT: 09/10/24 Activity Comments  94%, 84 bpm   LE PRE 2x10 -AP -hip add iso -LAQ 3# -sidestep x 3 laps counter 3# -standing march   Standing balance/tolerance -chest pass med balls at targets -bowling w/ med balls --standing in place reaching for med balls -stepping over 6 hurdles CGA-min A for support in LLE single leg stance                    HOME EXERCISE PROGRAM Last updated: 08/29/24 Access Code: [email or web address] URL: [email or web address] Date: 08/29/2024 Prepared by: [facility] - Outpatient  Rehab - [facility]  Exercises - Heel Toe Raises with Counter Support  - 1 x daily - 4 x weekly - 2 sets - 10 reps - March in Place  - 1 x daily - 4 x weekly - 2 sets - 10 reps - Side Stepping with Counter Support  - 1 x daily - 4 x weekly - 1 sets - 3 reps - Standing Foot Lift on Box (BKA)  - 1 x daily - 7 x weekly - 1-3 sets - 10 reps - Seated Long Arc Quad 3# - 1 x daily - 3 x weekly - 3 sets - 10 reps - Seated March 3# - 1 x daily - 3 x weekly - 3 sets - 10 reps - Seated Heel Toe Raises  - 1 x daily - 7 x weekly - 3 sets - 10 reps  PATIENT EDUCATION: Education details: HEP update to include 3lbs ankle weights  Person educated: Patient Education method: Explanation, Demonstration, Tactile cues, Verbal cues, and Handouts Education comprehension: verbalized understanding and returned demonstration     -------------------------------------------------------- Note:  Objective measures were completed at Evaluation unless otherwise noted.  DIAGNOSTIC FINDINGS:  04/25/24 brain MRI: Acute infarct in the right PCA territory as above. 2. 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 3. Additional remote lacunar infarcts in the left thalamus. 4. Focus of signal abnormality and susceptibility within the left parietal lobe, possibly representing remote hemorrhage or vascular malformation such as cavernous malformation. Additional similar focus of signal abnormality and susceptibility along the right dorsal aspect of the pons.  COGNITION: Overall cognitive status: difficult to discern; requires increased instruction for tasks occasionally    SENSATION: Pt reports N/T in UEs  COORDINATION: Alternating pronation/supination: slight dysmetria B Alternating toe tap: hypokinesia L>R Finger to nose: WNL B  MUSCLE TONE: WNL B LE  POSTURE: rounded shoulders, forward head, and shoulders elevations  LOWER EXTREMITY ROM:     Active  Right Eval Left Eval  Hip flexion    Hip extension    Hip abduction    Hip adduction    Hip internal rotation    Hip external rotation    Knee flexion    Knee extension    Ankle dorsiflexion 8 16  Ankle plantarflexion    Ankle inversion    Ankle eversion     (Blank rows = not tested)  LOWER EXTREMITY MMT:    MMT (in sitting) Right Eval Left Eval  Hip flexion 4+ 4+  Hip extension    Hip abduction 4- 4-  Hip adduction 4- 4-  Hip internal rotation    Hip external rotation    Knee flexion  3+ 3+  Knee extension 4+ 4  Ankle dorsiflexion 4 4  Ankle plantarflexion 4 4  Ankle inversion    Ankle eversion    (Blank rows = not tested)  GAIT: Findings: Assistive device utilized:quad tip cane, Level of assistance: SBA and CGA, and Comments: limited foot clearance B and tendency to shuffle feet; slowed *pt requires increased direction to navigate clinic environment; suspect vision vs.  Cognition as a factor   FUNCTIONAL TESTS:  5 times sit to stand: 23.8 sec pushing off LEs without standing fully  10 meter walk test: 17.23 sec with cane (1.9 ft/sec)                                                                                                                                 TREATMENT DATE: 05/23/24    PATIENT EDUCATION: Education details: edu on benefits of OT for vision, prognosis, POC, exam findings as they relate for functional impairments  Person educated: Patient and Spouse Education method: Explanation Education comprehension: verbalized understanding  HOME EXERCISE PROGRAM: Not yet initiated    GOALS: Goals reviewed with patient? Yes  SHORT TERM GOALS: Target date: 06/13/2024>UPDATED TARGET 09/20/2024  Patient to be independent with initial HEP. Baseline: HEP initiated;pt requires cueing Goal status: partially MET, 08/21/2024 Pt will improve 5x sit<>stand to less than or equal to 25 sec to demonstrate improved functional strength and transfer efficiency. Baseline:  33 sec Goal status:  INITIAL, 08/21/2024    LONG TERM GOALS: Target date: 07/18/2024>UPDATED TARGET 10/18/2024  Patient to be independent with advanced HEP. Baseline:  Goal status: IN PROGRESS 08/21/2024  to be improve to at least 400 ft for improved gait efficiency and endurance. Baseline: 365 ft (NA 08/21/24 due to foot pain) Goal status: IN PROGRESS  Patient to score at least 15/24 on DGI in order to decrease risk of falls.  Baseline: 7>8/24 08/21/2024 Goal status: IN PROGRESS 08/21/2024  Patient to demonstrate 5xSTS test in <15 sec in order to decrease risk of falls.  Baseline: 23 sec>33 sec 08/21/2024 Goal status: IN PROGRESS   Patient to demonstrate gait speed of at least 2.3 ft/sec in order to improve access to community.  Baseline: 1.9 ft/sec; 1.05 ft/sec 08/21/2024 Goal status: IN PROGRESS   ASSESSMENT:  CLINICAL IMPRESSION: Initial focus on LE PRE with  review to HEP items using 3# weights requiring frequent verbal cues for form and sequence but good safety and balance throughout.  Standing tolerance/balance activities requiring reaching for various weight med balls and throwing to target to improve coordination and reaching outside BOS for improved safety with unsupported standing for ADL performance and demo good stability throughout w/ supervision level assistance and good accuracy.  Trials with bowling motion for added step through with unsteadiness under these conditions with single limb support. Stepping over 6 hurdles requiring cga-min A for support when in LLE single limb support.  Redirection frequently needed when objectives are to his  left due to visual deficits. Attention for evironmental scanning with improved gait path thereafter OBJECTIVE IMPAIRMENTS: Abnormal gait, decreased activity tolerance, decreased balance, decreased coordination, decreased endurance, difficulty walking, decreased ROM, decreased strength, decreased safety awareness, impaired flexibility, and postural dysfunction.   ACTIVITY LIMITATIONS: carrying, lifting, bending, sitting, standing, squatting, stairs, transfers, bed mobility, bathing, toileting, dressing, reach over head, hygiene/grooming, and locomotion level  PARTICIPATION LIMITATIONS: meal prep, cleaning, shopping, community activity, and church  PERSONAL FACTORS: Age, Fitness, Past/current experiences, Time since onset of injury/illness/exacerbation, and 3+ comorbidities: CHF, carotid artery stenosis, CAD, DMII, HLD, HTN, prostate CA, L AV fistula are also affecting patient's functional outcome.   REHAB POTENTIAL: Good  CLINICAL DECISION MAKING: Evolving/moderate complexity  EVALUATION COMPLEXITY: Moderate  PLAN:  PT FREQUENCY: 1-2x/week  PT DURATION: 8 weeks per recert 08/21/2024  PLANNED INTERVENTIONS: [id number]- PT Re-evaluation, 97750- Physical Performance Testing, 97110-Therapeutic exercises,  97530- Therapeutic activity, 97112- Neuromuscular re-education, 97535- Self Care, [id number]- Manual therapy, [id number]- Gait training, [phone]- Canalith repositioning, Patient/Family education, Balance training, Stair training, Taping, Vestibular training, DME instructions, Cryotherapy, and Moist heat  PLAN FOR NEXT SESSION: Review and progress HEP for BLE strength and balance and update HEP as appropriate. Hopefully once pt has seen foot doctor, can work more on standing activities.  Faster walking w/ rollator (uses one on dialysis days)    9:28 AM, 09/10/24 M. Kelly Jaelee Laughter, PT, DPT Physical Therapist- [facility] Office Number: [phone]     "

## 2024-09-12 ENCOUNTER — Ambulatory Visit: Admitting: Occupational Therapy

## 2024-09-12 ENCOUNTER — Ambulatory Visit

## 2024-09-12 DIAGNOSIS — R2681 Unsteadiness on feet: Secondary | ICD-10-CM

## 2024-09-12 DIAGNOSIS — R2689 Other abnormalities of gait and mobility: Secondary | ICD-10-CM

## 2024-09-12 DIAGNOSIS — M6281 Muscle weakness (generalized): Secondary | ICD-10-CM | POA: Diagnosis not present

## 2024-09-12 DIAGNOSIS — R262 Difficulty in walking, not elsewhere classified: Secondary | ICD-10-CM

## 2024-09-12 DIAGNOSIS — R41842 Visuospatial deficit: Secondary | ICD-10-CM

## 2024-09-12 DIAGNOSIS — R29818 Other symptoms and signs involving the nervous system: Secondary | ICD-10-CM

## 2024-09-12 DIAGNOSIS — R278 Other lack of coordination: Secondary | ICD-10-CM

## 2024-09-12 NOTE — Therapy (Signed)
 " OUTPATIENT OCCUPATIONAL THERAPY NEURO  Treatment Note  Patient Name: Austin [name] MRN: [id number] DOB:1935/04/12, 89 y.o., male Today's Date: 09/12/2024  PCP: Merna Huxley, NP REFERRING PROVIDER: Merna Huxley, NP  END OF SESSION:           Past Medical History:  Diagnosis Date   Acute diastolic CHF (congestive heart failure) (HCC) 09/17/2020   Acute exacerbation of CHF (congestive heart failure) (HCC) 08/04/2019   Acute right PCA stroke (HCC) 04/25/2024   ANEMIA DUE TO CHRONIC BLOOD LOSS 03/13/2007   CAROTID ARTERY STENOSIS 05/10/2010   Coronary artery disease    DIABETES MELLITUS, TYPE II 09/19/2007   DISEASE, CEREBROVASCULAR NEC 03/05/2007   ESRD (end stage renal disease) on dialysis (HCC)    GERD 03/13/2007   HYPERLIPIDEMIA 03/05/2007   HYPERTENSION 03/05/2007   HYPOKALEMIA 11/09/2009   KNEE PAIN, RIGHT 11/09/2009   New onset a-fib (HCC) 04/27/2024   Eliquis    PROSTATE CANCER, HX OF 03/05/2007   Past Surgical History:  Procedure Laterality Date   A/V FISTULAGRAM N/A 03/21/2024   Procedure: A/V Fistulagram;  Surgeon: Magda Debby SAILOR, MD;  Location: [facility];  Service: Cardiovascular;  Laterality: N/A;   AV FISTULA PLACEMENT Left 10/18/2022   Procedure: INSERTION OF LEFT ARM BRACHIAL ARTERY TO AXILLARY VEIN ARTERIOVENOUS (AV) GORE-TEX GRAFT;  Surgeon: Lanis Fonda BRAVO, MD;  Location: [facility] OR;  Service: Vascular;  Laterality: Left;   CAROTID ARTERY ANGIOPLASTY Right 05/31/2000   ESOPHAGOGASTRODUODENOSCOPY (EGD) WITH PROPOFOL  N/A 11/04/2016   Procedure: ESOPHAGOGASTRODUODENOSCOPY (EGD) WITH PROPOFOL ;  Surgeon: Layla Lah, MD;  Location: [facility];  Service: Gastroenterology;  Laterality: N/A;   IR FLUORO GUIDE CV LINE RIGHT  10/25/2022   IR FLUORO GUIDE CV LINE RIGHT  10/27/2022   IR US  GUIDE VASC ACCESS RIGHT  10/25/2022   IR US  GUIDE VASC ACCESS RIGHT  10/27/2022   LAPAROSCOPIC APPENDECTOMY  02/20/2012   Procedure: APPENDECTOMY LAPAROSCOPIC;   Surgeon: Jina Nephew, MD;  Location: [facility] OR;  Service: General;  Laterality: N/A;   PROSTATE SURGERY     prostatectomy   Patient Active Problem List   Diagnosis Date Noted   New onset atrial fibrillation (HCC) 04/27/2024   Acute CVA (cerebrovascular accident) (HCC) 04/25/2024   Lumbar facet arthropathy 04/16/2024   Secondary hyperparathyroidism of renal origin 06/02/2023   Preoperative evaluation of a medical condition to rule out surgical contraindications (TAR required) 05/04/2023   Right carpal tunnel syndrome 03/16/2023   Coagulation defect, unspecified 11/07/2022   Personal history of nicotine dependence 11/01/2022   ESRD on dialysis (HCC) 10/29/2022   Protein-calorie malnutrition, severe 10/28/2022   Anemia in chronic kidney disease 10/28/2022   Hyperlipidemia, unspecified 10/28/2022   Pneumonia due to COVID-19 virus 10/25/2022   CKD (chronic kidney disease) stage 5, GFR less than 15 ml/min (HCC) 10/25/2022   Severe pulmonary hypertension (HCC) 10/25/2022   Moderate aortic regurgitation 10/25/2022   CAD in native artery 10/25/2022   UTI (urinary tract infection) 09/27/2021   CHF exacerbation (HCC) 09/26/2021   Acute respiratory failure with hypoxia (HCC) 09/26/2021   Acute on chronic diastolic CHF (congestive heart failure) (HCC) 07/09/2021   Dyspnea 06/01/2021   Upper airway cough syndrome 04/06/2021   Porokeratosis 03/26/2021   Pulmonary nodules 10/22/2020   Acute on chronic renal failure 10/09/2020   Mass of right lung 09/18/2020   Acute kidney injury superimposed on CKD 08/04/2019   Elevated troponin 08/04/2019   Hypokalemia 08/04/2019   Hypertensive urgency 03/28/2019   Elevated serum immunoglobulin free light  chains 02/20/2019   Gastrointestinal hemorrhage with melena    Melena 11/03/2016   Carotid artery stenosis 05/10/2010   Type 2 diabetes mellitus with hyperlipidemia (HCC) 09/19/2007   Iron  deficiency anemia due to chronic blood loss 03/13/2007   GERD  03/13/2007   Essential hypertension 03/05/2007   DISEASE, CEREBROVASCULAR NEC 03/05/2007   PROSTATE CANCER, HX OF 03/05/2007    ONSET DATE: referral date 05/24/24  REFERRING DIAG: H53.9 (ICD-10-CM) - Visual disturbance  THERAPY DIAG:  No diagnosis found.  Rationale for Evaluation and Treatment: Rehabilitation  SUBJECTIVE:   SUBJECTIVE STATEMENT: Pt reporting that he has a headache this morning, but wife reports that he took Tylenol  before therapy.  Pt accompanied by: self  PERTINENT HISTORY: Admitted to [facility] on 9/4 with visual changes over the last 4 days. MRI reveals acute R PCA infarct and remote R basal ganglia infarct with evidence of prior hemorrhage.   H/o: CHF, carotid artery stenosis, CAD, DMII, HLD, HTN, prostate CA, L AV fistula Dialysis: M, W, F  PRECAUTIONS: Fall and Other: L AV fistula- no L UE BP   WEIGHT BEARING RESTRICTIONS: No  PAIN:  Are you having pain? Yes: Pain location: head Pain description: headache - reports taking 2 Tylenol  before leaving for therapy appt  FALLS: Has patient fallen in last 6 months? No  LIVING ENVIRONMENT: Lives with: lives with their spouse Lives in: House/apartment Stairs: 3-4 steps to enter with railing; 2 story home with bedroom/full bath on the second floor Has following equipment at home: Vannie - 2 wheeled, Environmental Consultant - 4 wheeled, Wheelchair (manual), and quad tip cane  PLOF: Independent with basic ADLs and wife was assisting with shower transfers, cooking, cleaning   PATIENT GOALS: strategies to aid in vision; more energy to do things  OBJECTIVE:  Note: Objective measures were completed at Evaluation unless otherwise noted.  HAND DOMINANCE: Right  ADLs: Transfers/ambulation related to ADLs: using quad tip cane and cues for L attention Eating: Mod I Grooming: some difficulty with shaving, but I manage UB Dressing: Mod I LB Dressing: Mod I Toileting: Mod I Bathing: occasional assist with washing back Tub Shower  transfers: very difficult to get out of garden tub  IADLs: Shopping: wife completes Light housekeeping: wife completes Meal Prep: would make breakfast prior to stroke, but has not since Community mobility: not driving Medication management: spouse administers meds, has used a pill box in the past Financial management: wife completes  MOBILITY STATUS: utilizing quad tip cane   POSTURE COMMENTS:  rounded shoulders, forward head, and shoulders elevations   ACTIVITY TOLERANCE: Activity tolerance: reports decreased activity tolerance  UPPER EXTREMITY ROM:  WFL bilaterally   UPPER EXTREMITY MMT:   4+5 overall   COORDINATION: 9 Hole Peg test: Right: 47.44 sec; Left: 58.57 sec - pt picking up additional pegs to place due to decreased visual attention and looking for additional holes  SENSATION: WFL  COGNITION: Overall cognitive status: spouse reports that he gets confused more since the stroke than before  VISION: Subjective report: decreased attention to L Baseline vision: Bifocals and Wears glasses all the time Visual history: cataracts, had those removed  VISION ASSESSMENT: Gaze preference/alignment: WDL Tracking/Visual pursuits: Decreased smoothness with horizontal tracking Saccades: undershoots, decreased speed of saccadic movements, and decreased scanning to L visual field Visual Fields: Left visual field deficits Confrontation testing: pt unable to identify stimulus in Left visual field with or without stimulus on Right Line bisection: pt correctly bisecting 4/6 lines, verbally stating where is the end  of the line and turning head to scan full paper in 2 instances.  Patient has difficulty with following activities due to following visual impairments: reading from medical list as pt frequently omitting words on far left  PERCEPTION: Impaired: Inattention/neglect: does not attend to left visual field  OBSERVATIONS: Pt requiring verbal cues for visual attention and  scanning to Left while navigating through treatment space as pt initially not able to see treatment room on Left side of gym.                                                                                                                             TREATMENT DATE:  09/12/24 Self-care: Pt reporting that he has not yet obtained any of the DME recommended from last visit to aid in safety with tub/shower transfers.  Pt reporting hoping to get equipment this week, but also stating that he still needed to get in contact with the [place]. Coordination: engaged in simple peg board pattern with small pegs to carryover over to 9 hole peg test more closely.  Pt with min difficulty with coordination when manipulating pegs in finger tips to place into peg holes, however more impacted by arousal.  Pt requiring max multi-modal cues to maintain awake and engaged in activity. OT providing constant cues for attention and arousal, therefore decreased ability to differentiate between arousal and continued L inattention with pattern.    09/05/24 Self-care: educated on DME for tub/shower transfers, providing pictures of both shower chair and tub transfer bench.  OT providing verbal and written recommendations with pros and cons of each.  Pt would benefit most for tub transfer bench to increase safety and independence with transfer.  OT educated on how to position shower curtain to decrease water spillage to floor. OT showed pt video of shower setup with bench and shower curtain.  Engaged in simulated tub/shower transfer with pt transferring to tub bench with close supervision and min cues for technique.  OT providing setup for curtain to facilitate carryover.   Engaged in conversation about [employer] benefits as pt reporting that he has attempted to get set up, but has not been able to complete the process. OT encouraged pt and spouse to follow up on that as he may qualify for services and/or discounts on equipment and/or house  modifications.     87/68/74 Re-cert completed this date. Re-took 9HPT and completed visual scanning in busy environment with 100% accuracy. Discussed tub/shower transfers, pt reports that they are going to obtain either a tub bench or shower chair and grab bar for improving his independence with bathing. Pt agreed to continuing tx to allow for practicing transfers with AE. Pt completed peg board pattern with 100% accuracy.     PATIENT EDUCATION: Education details: DME and coordination Person educated: Patient Education method: Explanation and Verbal cues Education comprehension: verbalized understanding and needs further education  HOME EXERCISE PROGRAM: Visual spatial awareness - see pt instructions  GOALS: Goals reviewed with patient? Yes   NEW SHORT TERM GOALS:    1. Pt will be educated in safe transfer techniques for tub/shower chair/bench   Baseline: AE not arrived yet   Goal status: in progress  NEW LONG TERM GOALS:   Pt will demonstrate improved fine motor coordination for ADLs and visual attention to Left visual field as evidenced by decreasing 9 hole peg test score for BUE by 5 secs on RUE.  Baseline: Right: 47.44 sec; Left: 58.57 sec 08/21/24: Right: 46.69 sec; Left 48 sec Goal status:  in progress    2. Pt will report successful Mod I transfer in/out of tub/shower with AE   Baseline: AE not installed yet   Goal status:  in progress  ASSESSMENT:  CLINICAL IMPRESSION: Patient is a 89 y.o. male who was seen today for occupational therapy treatment for visual deficits s/p CVA. Pt demonstrating decreased arousal this session, requiring multi-modal cues for arousal and to remain engaged in therapeutic activity.  Due to decreased arousal it is difficult to assess coordination and visual attention as therapist providing cues throughout to maintain engagement in activity.  Pt will continue to benefit from skilled OT services in the outpatient setting to work towards  remaining goals or until max rehab potential is met.     PERFORMANCE DEFICITS: in functional skills including ADLs, IADLs, Fine motor control, balance, body mechanics, endurance, decreased knowledge of precautions, decreased knowledge of use of DME, and vision, cognitive skills including memory and safety awareness, and psychosocial skills including coping strategies, environmental adaptation, and routines and behaviors.    PLAN:  OT FREQUENCY: 1x/week  plus today's visit  OT DURATION: 4 weeks  PLANNED INTERVENTIONS: 97168 OT Re-evaluation, 97535 self care/ADL training, 02889 therapeutic exercise, 97530 therapeutic activity, 97112 neuromuscular re-education, functional mobility training, visual/perceptual remediation/compensation, psychosocial skills training, energy conservation, coping strategies training, patient/family education, and DME and/or AE instructions  RECOMMENDED OTHER SERVICES: NA  CONSULTED AND AGREED WITH PLAN OF CARE: Patient and family member/caregiver  PLAN FOR NEXT SESSION: Tub transfer bench/chair transfer education  Coordination activities/HEP prn    KAYLENE DOMINO, OTR/L 09/12/2024, 7:56 AM   [facility] at [facility] [address], [address] [place], [place] [address] Phone # [phone] Fax # [phone]         "

## 2024-09-12 NOTE — Therapy (Signed)
 " OUTPATIENT PHYSICAL THERAPY NEURO TREATMENT   Patient Name: Austin [name] MRN: [id number] DOB:07/25/1935, 89 y.o., male Today's Date: 09/12/2024   PCP: Merna Huxley, NP  REFERRING PROVIDER: Merna Huxley, NP     END OF SESSION:  PT End of Session - 09/12/24 1014     Visit Number 14    Number of Visits 25    Date for Recertification  10/18/24    Authorization Type [employer]-reauth submitted    Authorization Time Period approved 16 more PT visits from 08/21/24-10/18/24    Authorization - Visit Number 6    Authorization - Number of Visits 16    Progress Note Due on Visit 19   PN done at visit 9   PT Start Time 1015    PT Stop Time 1100    PT Time Calculation (min) 45 min    Equipment Utilized During Treatment Gait belt    Activity Tolerance Patient tolerated treatment well    Behavior During Therapy [facility] for tasks assessed/performed             Past Medical History:  Diagnosis Date   Acute diastolic CHF (congestive heart failure) (HCC) 09/17/2020   Acute exacerbation of CHF (congestive heart failure) (HCC) 08/04/2019   Acute right PCA stroke (HCC) 04/25/2024   ANEMIA DUE TO CHRONIC BLOOD LOSS 03/13/2007   CAROTID ARTERY STENOSIS 05/10/2010   Coronary artery disease    DIABETES MELLITUS, TYPE II 09/19/2007   DISEASE, CEREBROVASCULAR NEC 03/05/2007   ESRD (end stage renal disease) on dialysis (HCC)    GERD 03/13/2007   HYPERLIPIDEMIA 03/05/2007   HYPERTENSION 03/05/2007   HYPOKALEMIA 11/09/2009   KNEE PAIN, RIGHT 11/09/2009   New onset a-fib (HCC) 04/27/2024   Eliquis    PROSTATE CANCER, HX OF 03/05/2007   Past Surgical History:  Procedure Laterality Date   A/V FISTULAGRAM N/A 03/21/2024   Procedure: A/V Fistulagram;  Surgeon: Magda Debby SAILOR, MD;  Location: [facility];  Service: Cardiovascular;  Laterality: N/A;   AV FISTULA PLACEMENT Left 10/18/2022   Procedure: INSERTION OF LEFT ARM BRACHIAL ARTERY TO AXILLARY VEIN ARTERIOVENOUS (AV) GORE-TEX GRAFT;   Surgeon: Lanis Fonda BRAVO, MD;  Location: [facility] OR;  Service: Vascular;  Laterality: Left;   CAROTID ARTERY ANGIOPLASTY Right 05/31/2000   ESOPHAGOGASTRODUODENOSCOPY (EGD) WITH PROPOFOL  N/A 11/04/2016   Procedure: ESOPHAGOGASTRODUODENOSCOPY (EGD) WITH PROPOFOL ;  Surgeon: Layla Lah, MD;  Location: [facility];  Service: Gastroenterology;  Laterality: N/A;   IR FLUORO GUIDE CV LINE RIGHT  10/25/2022   IR FLUORO GUIDE CV LINE RIGHT  10/27/2022   IR US  GUIDE VASC ACCESS RIGHT  10/25/2022   IR US  GUIDE VASC ACCESS RIGHT  10/27/2022   LAPAROSCOPIC APPENDECTOMY  02/20/2012   Procedure: APPENDECTOMY LAPAROSCOPIC;  Surgeon: Jina Nephew, MD;  Location: [facility] OR;  Service: General;  Laterality: N/A;   PROSTATE SURGERY     prostatectomy   Patient Active Problem List   Diagnosis Date Noted   New onset atrial fibrillation (HCC) 04/27/2024   Acute CVA (cerebrovascular accident) (HCC) 04/25/2024   Lumbar facet arthropathy 04/16/2024   Secondary hyperparathyroidism of renal origin 06/02/2023   Preoperative evaluation of a medical condition to rule out surgical contraindications (TAR required) 05/04/2023   Right carpal tunnel syndrome 03/16/2023   Coagulation defect, unspecified 11/07/2022   Personal history of nicotine dependence 11/01/2022   ESRD on dialysis (HCC) 10/29/2022   Protein-calorie malnutrition, severe 10/28/2022   Anemia in chronic kidney disease 10/28/2022   Hyperlipidemia, unspecified 10/28/2022  Pneumonia due to COVID-19 virus 10/25/2022   CKD (chronic kidney disease) stage 5, GFR less than 15 ml/min (HCC) 10/25/2022   Severe pulmonary hypertension (HCC) 10/25/2022   Moderate aortic regurgitation 10/25/2022   CAD in native artery 10/25/2022   UTI (urinary tract infection) 09/27/2021   CHF exacerbation (HCC) 09/26/2021   Acute respiratory failure with hypoxia (HCC) 09/26/2021   Acute on chronic diastolic CHF (congestive heart failure) (HCC) 07/09/2021   Dyspnea 06/01/2021    Upper airway cough syndrome 04/06/2021   Porokeratosis 03/26/2021   Pulmonary nodules 10/22/2020   Acute on chronic renal failure 10/09/2020   Mass of right lung 09/18/2020   Acute kidney injury superimposed on CKD 08/04/2019   Elevated troponin 08/04/2019   Hypokalemia 08/04/2019   Hypertensive urgency 03/28/2019   Elevated serum immunoglobulin free light chains 02/20/2019   Gastrointestinal hemorrhage with melena    Melena 11/03/2016   Carotid artery stenosis 05/10/2010   Type 2 diabetes mellitus with hyperlipidemia (HCC) 09/19/2007   Iron  deficiency anemia due to chronic blood loss 03/13/2007   GERD 03/13/2007   Essential hypertension 03/05/2007   DISEASE, CEREBROVASCULAR NEC 03/05/2007   PROSTATE CANCER, HX OF 03/05/2007    ONSET DATE: 04/25/24  REFERRING DIAG: I63.9 (ICD-10-CM) - Acute CVA (cerebrovascular accident) (HCC) R26.81 (ICD-10-CM) - Gait instability  THERAPY DIAG:  Muscle weakness (generalized)  Unsteadiness on feet  Other abnormalities of gait and mobility  Difficulty in walking, not elsewhere classified  Rationale for Evaluation and Treatment: Rehabilitation  SUBJECTIVE:                                                                                                                                                                                             SUBJECTIVE STATEMENT: Feeling tired today. OT reports vital signs WNL  Pt accompanied by: significant other and in waiting room  PERTINENT HISTORY: CHF, carotid artery stenosis, CAD, DMII, HLD, HTN, prostate CA, L AV fistula Dialysis: M, W, F  PAIN:  Are you having pain? Yes: NPRS scale: 0/10 Pain location: L foot Pain description: corn on bottom of foot Aggravating factors: putting weight on foot Relieving factors: Tylenol   PRECAUTIONS: Fall and Other: L AV fistula- no L UE BP   RED FLAGS: None   WEIGHT BEARING RESTRICTIONS: No  FALLS: Has patient fallen in last 6 months? No  LIVING  ENVIRONMENT: Lives with: lives with their spouse Lives in: House/apartment Stairs: 3-4 steps to enter with railing; 2 story home Has following equipment at home: Vannie - 2 wheeled, Environmental Consultant - 4 wheeled, Wheelchair (manual), and quad tip cane  PLOF: Independent with basic ADLs and  wife was assisting with shower transfers, cooking, cleaning   PATIENT GOALS: maybe a little balance; 08/21/2024  continue balance and lessen pain on L foot  OBJECTIVE:   TODAY'S TREATMENT: 09/12/24 Activity Comments  NU-step level 5 x 8 min Cues for steady state 50-60 SPM  Gait training -use of bright colored sticks on ground for hurdles to promote increase step height/length and emphasis on visual scanning for left turns with use of colored contrast for attention  LAQ 3x12, 3#   Hip add iso 3x12            TODAY'S TREATMENT: 09/10/24 Activity Comments  94%, 84 bpm   LE PRE 2x10 -AP -hip add iso -LAQ 3# -sidestep x 3 laps counter 3# -standing march   Standing balance/tolerance -chest pass med balls at targets -bowling w/ med balls --standing in place reaching for med balls -stepping over 6 hurdles CGA-min A for support in LLE single leg stance                    HOME EXERCISE PROGRAM Last updated: 08/29/24 Access Code: [email or web address] URL: [email or web address] Date: 08/29/2024 Prepared by: [facility] - Outpatient  Rehab - [facility]  Exercises - Heel Toe Raises with Counter Support  - 1 x daily - 4 x weekly - 2 sets - 10 reps - March in Place  - 1 x daily - 4 x weekly - 2 sets - 10 reps - Side Stepping with Counter Support  - 1 x daily - 4 x weekly - 1 sets - 3 reps - Standing Foot Lift on Box (BKA)  - 1 x daily - 7 x weekly - 1-3 sets - 10 reps - Seated Long Arc Quad 3# - 1 x daily - 3 x weekly - 3 sets - 10 reps - Seated March 3# - 1 x daily - 3 x weekly - 3 sets - 10 reps - Seated Heel Toe Raises  - 1 x daily - 7 x weekly - 3 sets - 10 reps  PATIENT  EDUCATION: Education details: HEP update to include 3lbs ankle weights  Person educated: Patient Education method: Explanation, Demonstration, Tactile cues, Verbal cues, and Handouts Education comprehension: verbalized understanding and returned demonstration     -------------------------------------------------------- Note: Objective measures were completed at Evaluation unless otherwise noted.  DIAGNOSTIC FINDINGS:  04/25/24 brain MRI: Acute infarct in the right PCA territory as above. 2. 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 3. Additional remote lacunar infarcts in the left thalamus. 4. Focus of signal abnormality and susceptibility within the left parietal lobe, possibly representing remote hemorrhage or vascular malformation such as cavernous malformation. Additional similar focus of signal abnormality and susceptibility along the right dorsal aspect of the pons.  COGNITION: Overall cognitive status: difficult to discern; requires increased instruction for tasks occasionally    SENSATION: Pt reports N/T in UEs  COORDINATION: Alternating pronation/supination: slight dysmetria B Alternating toe tap: hypokinesia L>R Finger to nose: WNL B  MUSCLE TONE: WNL B LE  POSTURE: rounded shoulders, forward head, and shoulders elevations  LOWER EXTREMITY ROM:     Active  Right Eval Left Eval  Hip flexion    Hip extension    Hip abduction    Hip adduction    Hip internal rotation    Hip external rotation    Knee flexion    Knee extension    Ankle dorsiflexion 8 16  Ankle plantarflexion    Ankle inversion  Ankle eversion     (Blank rows = not tested)  LOWER EXTREMITY MMT:    MMT (in sitting) Right Eval Left Eval  Hip flexion 4+ 4+  Hip extension    Hip abduction 4- 4-  Hip adduction 4- 4-  Hip internal rotation    Hip external rotation    Knee flexion 3+ 3+  Knee extension 4+ 4  Ankle dorsiflexion 4 4  Ankle  plantarflexion 4 4  Ankle inversion    Ankle eversion    (Blank rows = not tested)  GAIT: Findings: Assistive device utilized:quad tip cane, Level of assistance: SBA and CGA, and Comments: limited foot clearance B and tendency to shuffle feet; slowed *pt requires increased direction to navigate clinic environment; suspect vision vs. Cognition as a factor   FUNCTIONAL TESTS:  5 times sit to stand: 23.8 sec pushing off LEs without standing fully  10 meter walk test: 17.23 sec with cane (1.9 ft/sec)                                                                                                                                 TREATMENT DATE: 05/23/24    PATIENT EDUCATION: Education details: edu on benefits of OT for vision, prognosis, POC, exam findings as they relate for functional impairments  Person educated: Patient and Spouse Education method: Explanation Education comprehension: verbalized understanding  HOME EXERCISE PROGRAM: Not yet initiated    GOALS: Goals reviewed with patient? Yes  SHORT TERM GOALS: Target date: 06/13/2024>UPDATED TARGET 09/20/2024  Patient to be independent with initial HEP. Baseline: HEP initiated;pt requires cueing Goal status: partially MET, 08/21/2024 Pt will improve 5x sit<>stand to less than or equal to 25 sec to demonstrate improved functional strength and transfer efficiency. Baseline:  33 sec Goal status:  INITIAL, 08/21/2024    LONG TERM GOALS: Target date: 07/18/2024>UPDATED TARGET 10/18/2024  Patient to be independent with advanced HEP. Baseline:  Goal status: IN PROGRESS 08/21/2024  to be improve to at least 400 ft for improved gait efficiency and endurance. Baseline: 365 ft (NA 08/21/24 due to foot pain) Goal status: IN PROGRESS  Patient to score at least 15/24 on DGI in order to decrease risk of falls.  Baseline: 7>8/24 08/21/2024 Goal status: IN PROGRESS 08/21/2024  Patient to demonstrate 5xSTS test in <15 sec in  order to decrease risk of falls.  Baseline: 23 sec>33 sec 08/21/2024 Goal status: IN PROGRESS   Patient to demonstrate gait speed of at least 2.3 ft/sec in order to improve access to community.  Baseline: 1.9 ft/sec; 1.05 ft/sec 08/21/2024 Goal status: IN PROGRESS   ASSESSMENT:  CLINICAL IMPRESSION: Pt demo increased somnolence today requiring frequent redirection to task as he reports poor sleep quality last night. Initiated with NU-step for cardiovascular exercise followed by gait  training activity with emphasis on imroving step height/length with physical obstacles with vright contrast colors to imrpove scanning to the left for improved safety  with ambulation and to reduce double limb support to promote improved gait speed.  Poor attention with task due to somnolence and opted for seated exercises which also required frequent tactile cues to maintain attention.   OBJECTIVE IMPAIRMENTS: Abnormal gait, decreased activity tolerance, decreased balance, decreased coordination, decreased endurance, difficulty walking, decreased ROM, decreased strength, decreased safety awareness, impaired flexibility, and postural dysfunction.   ACTIVITY LIMITATIONS: carrying, lifting, bending, sitting, standing, squatting, stairs, transfers, bed mobility, bathing, toileting, dressing, reach over head, hygiene/grooming, and locomotion level  PARTICIPATION LIMITATIONS: meal prep, cleaning, shopping, community activity, and church  PERSONAL FACTORS: Age, Fitness, Past/current experiences, Time since onset of injury/illness/exacerbation, and 3+ comorbidities: CHF, carotid artery stenosis, CAD, DMII, HLD, HTN, prostate CA, L AV fistula are also affecting patient's functional outcome.   REHAB POTENTIAL: Good  CLINICAL DECISION MAKING: Evolving/moderate complexity  EVALUATION COMPLEXITY: Moderate  PLAN:  PT FREQUENCY: 1-2x/week  PT DURATION: 8 weeks per recert 08/21/2024  PLANNED INTERVENTIONS: [id number]- PT  Re-evaluation, 97750- Physical Performance Testing, 97110-Therapeutic exercises, 97530- Therapeutic activity, 97112- Neuromuscular re-education, 97535- Self Care, [id number]- Manual therapy, [id number]- Gait training, [phone]- Canalith repositioning, Patient/Family education, Balance training, Stair training, Taping, Vestibular training, DME instructions, Cryotherapy, and Moist heat  PLAN FOR NEXT SESSION: Review and progress HEP for BLE strength and balance and update HEP as appropriate. Hopefully once pt has seen foot doctor, can work more on standing activities.  Faster walking w/ rollator (uses one on dialysis days)    10:15 AM, 09/12/24 M. Kelly Esli Jernigan, PT, DPT Physical Therapist- [place] Office Number: [phone]     "

## 2024-09-17 ENCOUNTER — Ambulatory Visit

## 2024-09-17 ENCOUNTER — Ambulatory Visit: Admitting: Occupational Therapy

## 2024-09-19 ENCOUNTER — Ambulatory Visit (INDEPENDENT_AMBULATORY_CARE_PROVIDER_SITE_OTHER): Admitting: Podiatry

## 2024-09-19 ENCOUNTER — Encounter: Payer: Self-pay | Admitting: Podiatry

## 2024-09-19 ENCOUNTER — Ambulatory Visit

## 2024-09-19 DIAGNOSIS — I70209 Unspecified atherosclerosis of native arteries of extremities, unspecified extremity: Secondary | ICD-10-CM

## 2024-09-19 DIAGNOSIS — M79675 Pain in left toe(s): Secondary | ICD-10-CM | POA: Diagnosis not present

## 2024-09-19 DIAGNOSIS — B351 Tinea unguium: Secondary | ICD-10-CM

## 2024-09-19 DIAGNOSIS — M79674 Pain in right toe(s): Secondary | ICD-10-CM | POA: Diagnosis not present

## 2024-09-19 DIAGNOSIS — L84 Corns and callosities: Secondary | ICD-10-CM

## 2024-09-19 DIAGNOSIS — E1151 Type 2 diabetes mellitus with diabetic peripheral angiopathy without gangrene: Secondary | ICD-10-CM | POA: Diagnosis not present

## 2024-09-19 NOTE — Progress Notes (Signed)
 This patient returns to my office for at risk foot care.  This patient requires this care by a professional since this patient will be at risk due to having diabetic neuropathy and CKD   This patient says he has developed a painful callus on left forefoot which is painful walking and wearing his shoes.   This patient presents for at risk foot care today.  He presents to the office with his wife.  General Appearance  Alert, conversant and in no acute stress.  Vascular  Dorsalis pedis and posterior tibial  pulses are weakly  palpable  bilaterally.  Capillary return is within normal limits  bilaterally. Temperature is within normal limits  bilaterally.  Neurologic  Senn-Weinstein monofilament wire test diminished bilaterally. Muscle power within normal limits bilaterally.  Nails Thick disfigured discolored nails with subungual debris  from hallux to fifth toes bilaterally. No evidence of bacterial infection or drainage bilaterally.  Orthopedic  No limitations of motion  feet .  No crepitus or effusions noted.  No bony pathology or digital deformities noted.  HAV  B/L  Hammer toes  B/L. Plantar flexed third metatarsal left foot.  Skin  normotropic skin noted bilaterally.  No signs of infections or ulcers noted.   symptomatic callus sub 3,4 and 5 left foot.  Heel callus  B/L.  Callus left forefoot. Onychomycosis  B/L.  Consent was obtained for treatment procedures.  Callus was identified under third metatarsal left foot.  The callus was debrided with # 15 blade and dremel tool.  The wife of this patient was unable to find lesion on both feet.  I was unable to find the lesion which he said has pus. Patient said he was better when leaving.   Return office visit    12 weeks                 Told patient to return for periodic foot care and evaluation due to potential at risk complications.   Cordella Bold DPM

## 2024-09-26 ENCOUNTER — Encounter: Payer: Self-pay | Admitting: Adult Health

## 2024-09-26 ENCOUNTER — Ambulatory Visit: Admitting: Adult Health

## 2024-09-26 VITALS — BP 130/80 | HR 83 | Temp 98.8°F

## 2024-09-26 DIAGNOSIS — J0140 Acute pansinusitis, unspecified: Secondary | ICD-10-CM

## 2024-09-26 MED ORDER — DOXYCYCLINE HYCLATE 100 MG PO CAPS: 100.0000 mg | ORAL_CAPSULE | Freq: Two times a day (BID) | ORAL | 0 refills | Status: AC

## 2024-09-26 NOTE — Patient Instructions (Signed)
 It was great seeing you today   I think you have a sinus infection. I have sent in an antibiotic called Doxycyline. Take this twice a day for 7 days   Let me know if this is not helping

## 2024-09-26 NOTE — Progress Notes (Signed)
 "  Subjective:    Patient ID: Austin [name], male    DOB: [birth year], 89 y.o.   MRN: [id number]  HPI Discussed the use of AI scribe software for clinical note transcription with the patient, who gave verbal consent to proceed.  History of Present Illness   Austin [name] is an 89 year old male  who presents with nasal and chest congestion.  He has had morning nasal congestion for the past few weeks with clogged passages and more symptoms on the right. He denies fever, or chills. He also has chest congestion that worsens his breathing with a sensation of water in my lungs, and a severe cough that sometimes wakes him at night. . Each morning he blows his nose and coughs up whitish mucus. He is using Mucinex  at home, which he feels improves his symptoms.        Review of Systems See HPI   Past Medical History:  Diagnosis Date   Acute diastolic CHF (congestive heart failure) (HCC) 09/17/2020   Acute exacerbation of CHF (congestive heart failure) (HCC) 08/04/2019   Acute right PCA stroke (HCC) 04/25/2024   ANEMIA DUE TO CHRONIC BLOOD LOSS 03/13/2007   CAROTID ARTERY STENOSIS 05/10/2010   Coronary artery disease    DIABETES MELLITUS, TYPE II 09/19/2007   DISEASE, CEREBROVASCULAR NEC 03/05/2007   ESRD (end stage renal disease) on dialysis (HCC)    GERD 03/13/2007   HYPERLIPIDEMIA 03/05/2007   HYPERTENSION 03/05/2007   HYPOKALEMIA 11/09/2009   KNEE PAIN, RIGHT 11/09/2009   New onset a-fib (HCC) 04/27/2024   Eliquis    PROSTATE CANCER, HX OF 03/05/2007    Social History   Socioeconomic History   Marital status: Married    Spouse name: Not on file   Number of children: Not on file   Years of education: Not on file   Highest education level: Not on file  Occupational History   Not on file  Tobacco Use   Smoking status: Former    Current packs/day: 0.00    Average packs/day: 1 pack/day for 10.0 years (10.0 ttl pk-yrs)    Types: Cigarettes    Start date: 08/22/1964    Quit date:  08/22/1974    Years since quitting: 50.1   Smokeless tobacco: Never  Vaping Use   Vaping status: Never Used  Substance and Sexual Activity   Alcohol  use: No    Alcohol /week: 0.0 standard drinks of alcohol    Drug use: No   Sexual activity: Not Currently  Other Topics Concern   Not on file  Social History Narrative   Retired - Administrator, [employer]    Married 53 years       He enjoys traveling    Social Drivers of Health   Tobacco Use: Medium Risk (09/26/2024)   Patient History    Smoking Tobacco Use: Former    Smokeless Tobacco Use: Never    Passive Exposure: Not on Actuary Strain: Low Risk (02/09/2023)   Overall Financial Resource Strain (CARDIA)    Difficulty of Paying Living Expenses: Not very hard  Food Insecurity: No Food Insecurity (04/26/2024)   Epic    Worried About Radiation Protection Practitioner of Food in the Last Year: Never true    [employer] of Food in the Last Year: Never true  Transportation Needs: No Transportation Needs (04/26/2024)   Epic    Lack of Transportation (Medical): No    Lack of Transportation (Non-Medical): No  Physical Activity: Insufficiently Active (02/09/2023)   Exercise Vital  Sign    Days of Exercise per Week: 3 days    Minutes of Exercise per Session: 30 min  Stress: No Stress Concern Present (02/09/2023)   [employer] of Occupational Health - Occupational Stress Questionnaire    Feeling of Stress : Not at all  Social Connections: Unknown (04/26/2024)   Social Connection and Isolation Panel    Frequency of Communication with Friends and Family: Patient declined    Frequency of Social Gatherings with Friends and Family: Patient declined    Attends Religious Services: Patient declined    Active Member of Clubs or Organizations: Not on file    Attends Banker Meetings: Patient declined    Marital Status: Married  Catering Manager Violence: Not At Risk (04/26/2024)   Epic    Fear of Current or Ex-Partner: No    Emotionally Abused: No     Physically Abused: No    Sexually Abused: No  Depression (PHQ2-9): Low Risk (03/07/2024)   Depression (PHQ2-9)    PHQ-2 Score: 0  Alcohol  Screen: Low Risk (02/09/2023)   Alcohol  Screen    Last Alcohol  Screening Score (AUDIT): 0  Housing: Low Risk (04/26/2024)   Epic    Unable to Pay for Housing in the Last Year: No    Number of Times Moved in the Last Year: 0    Homeless in the Last Year: No  Utilities: Not At Risk (04/26/2024)   Epic    Threatened with loss of utilities: No  Health Literacy: Not on file    Past Surgical History:  Procedure Laterality Date   A/V FISTULAGRAM N/A 03/21/2024   Procedure: A/V Fistulagram;  Surgeon: Magda Debby SAILOR, MD;  Location: [facility];  Service: Cardiovascular;  Laterality: N/A;   AV FISTULA PLACEMENT Left 10/18/2022   Procedure: INSERTION OF LEFT ARM BRACHIAL ARTERY TO AXILLARY VEIN ARTERIOVENOUS (AV) GORE-TEX GRAFT;  Surgeon: Lanis Fonda BRAVO, MD;  Location: [facility] OR;  Service: Vascular;  Laterality: Left;   CAROTID ARTERY ANGIOPLASTY Right 05/31/2000   ESOPHAGOGASTRODUODENOSCOPY (EGD) WITH PROPOFOL  N/A 11/04/2016   Procedure: ESOPHAGOGASTRODUODENOSCOPY (EGD) WITH PROPOFOL ;  Surgeon: Layla Lah, MD;  Location: [facility];  Service: Gastroenterology;  Laterality: N/A;   IR FLUORO GUIDE CV LINE RIGHT  10/25/2022   IR FLUORO GUIDE CV LINE RIGHT  10/27/2022   IR US  GUIDE VASC ACCESS RIGHT  10/25/2022   IR US  GUIDE VASC ACCESS RIGHT  10/27/2022   LAPAROSCOPIC APPENDECTOMY  02/20/2012   Procedure: APPENDECTOMY LAPAROSCOPIC;  Surgeon: Jina Nephew, MD;  Location: [facility] OR;  Service: General;  Laterality: N/A;   PROSTATE SURGERY     prostatectomy    Family History  Problem Relation Age of Onset   Hypertension Mother    Cancer Father        Mesothelioma    Stomach cancer Brother    Cancer Brother    Cancer - Cervical Brother    Cancer Brother    Diabetes Brother    Esophageal cancer Neg Hx    Colon cancer Neg Hx    Pancreatic cancer Neg Hx      Allergies[1]  Medications Ordered Prior to Encounter[2]  BP 130/80   Pulse 83   Temp 98.8 F (37.1 C) (Oral)   SpO2 95%       Objective:   Physical Exam Vitals and nursing note reviewed.  Constitutional:      Appearance: Normal appearance.  HENT:     Nose: Congestion present. No rhinorrhea.  Right Turbinates: Enlarged and swollen.     Left Turbinates: Enlarged and swollen.     Right Sinus: Maxillary sinus tenderness and frontal sinus tenderness present.     Left Sinus: No maxillary sinus tenderness or frontal sinus tenderness.     Mouth/Throat:     Mouth: Mucous membranes are moist.     Pharynx: Oropharynx is clear.  Cardiovascular:     Rate and Rhythm: Normal rate and regular rhythm.     Pulses: Normal pulses.     Heart sounds: Normal heart sounds.  Pulmonary:     Effort: Pulmonary effort is normal.     Breath sounds: Normal breath sounds. No stridor. No wheezing, rhonchi or rales.  Musculoskeletal:        General: Normal range of motion.  Skin:    General: Skin is warm and dry.     Capillary Refill: Capillary refill takes less than 2 seconds.  Neurological:     General: No focal deficit present.     Mental Status: He is alert and oriented to person, place, and time.  Psychiatric:        Mood and Affect: Mood normal.        Behavior: Behavior normal.        Thought Content: Thought content normal.        Judgment: Judgment normal.         Assessment & Plan:  Assessment and Plan    Acute pansinusitis - Will treat for symptoms and duration  - Prescribed doxycycline , one tablet BID for seven days. - Advised to report if symptoms do not improve.       Shavonda Wiedman, NP     [1]  Allergies Allergen Reactions   Aspirin  Other (See Comments)    High doses causes stomach ulcer and bleeding  [2]  Current Outpatient Medications on File Prior to Visit  Medication Sig Dispense Refill   Accu-Chek Softclix Lancets lancets USED TO CHECK BLOOD GLUCOSE  TWICE A DAY OR AS NEEDED 100 each 6   acetaminophen  (TYLENOL ) 500 MG tablet Take 1,000 mg by mouth every 4 (four) hours as needed.     albuterol  (VENTOLIN  HFA) 108 (90 Base) MCG/ACT inhaler Inhale 2 puffs into the lungs every 6 (six) hours as needed for wheezing or shortness of breath. 8.5 each 2   amLODipine  (NORVASC ) 10 MG tablet TAKE 1 TABLET BY MOUTH EVERY DAY 90 tablet 3   amoxicillin  (AMOXIL ) 875 MG tablet Take 875 mg by mouth 2 (two) times daily.     apixaban  (ELIQUIS ) 2.5 MG TABS tablet Take 1 tablet (2.5 mg total) by mouth 2 (two) times daily. 60 tablet 11   atorvastatin  (LIPITOR) 20 MG tablet TAKE 1 TABLET BY MOUTH EVERY DAY 90 tablet 3   benzonatate  (TESSALON ) 100 MG capsule TAKE 1 CAPSULE (100 MG TOTAL) BY MOUTH EVERY 6 (SIX) HOURS AS NEEDED FOR COUGH 30 capsule 1   cetirizine  (ZYRTEC ) 5 MG tablet Take 1 tablet (5 mg total) by mouth at bedtime. 60 tablet 0   cholecalciferol  (VITAMIN D3) 25 MCG (1000 UNIT) tablet Take 1,000 Units by mouth daily.     cloNIDine  (CATAPRES ) 0.2 MG tablet Take 1 tablet (0.2 mg total) by mouth daily. 30 tablet 6   doxazosin  (CARDURA ) 2 MG tablet Take 2 mg by mouth at bedtime.   3   esomeprazole  (NEXIUM ) 40 MG capsule TAKE 1 CAPSULE BY MOUTH EVERY DAY 90 capsule 3   fluticasone  (FLONASE ) 50 MCG/ACT nasal spray  Place 1 spray into both nostrils daily. 9.9 mL 2   glucose blood (ACCU-CHEK GUIDE) test strip USE TO CHECK BLOOD GLUCOSE TWICE A DAY OR AS NEEDED 300 strip 1   guaiFENesin  (MUCINEX ) 600 MG 12 hr tablet Take 600 mg by mouth at bedtime.     isosorbide -hydrALAZINE  (BIDIL ) 20-37.5 MG tablet Take 1 tablet by mouth 3 (three) times daily. 30 tablet 0   ketoconazole  (NIZORAL ) 2 % cream Apply 1 Application topically 2 (two) times daily. 60 g 2   lactulose  (CHRONULAC ) 10 GM/15ML solution TAKE 30 ML BY MOUTH TWICE DAILY AS NEEDED 237 mL 3   Melatonin 10 MG TABS Take 10 mg by mouth at bedtime.     Methoxy PEG-Epoetin  Beta (MIRCERA IJ) Mircera     methylcellulose  (CITRUCEL) oral powder 1 tablespoon every 2-3 days,alternating with lactulose      Multiple Vitamins-Minerals (PRESERVISION AREDS 2) CAPS Take 1 capsule by mouth daily.     Propylene Glycol (SYSTANE BALANCE OP) Place 1 drop into both eyes at bedtime.     sevelamer  carbonate (RENVELA ) 800 MG tablet Take 800 mg by mouth 3 (three) times daily.     No current facility-administered medications on file prior to visit.   "

## 2024-09-27 ENCOUNTER — Encounter (HOSPITAL_COMMUNITY): Payer: Self-pay | Admitting: Vascular Surgery

## 2024-10-03 ENCOUNTER — Ambulatory Visit: Attending: Adult Health | Admitting: Occupational Therapy

## 2024-10-03 ENCOUNTER — Ambulatory Visit: Attending: Adult Health | Admitting: Physical Therapy

## 2024-11-07 ENCOUNTER — Ambulatory Visit: Admitting: Podiatry

## 2024-11-12 ENCOUNTER — Ambulatory Visit: Admitting: Family Medicine

## 2024-12-05 ENCOUNTER — Ambulatory Visit: Admitting: Podiatry

## 2025-02-04 ENCOUNTER — Other Ambulatory Visit

## 2025-03-13 ENCOUNTER — Encounter: Admitting: Adult Health
